# Patient Record
Sex: Female | Born: 1955
Health system: Southern US, Community
[De-identification: ages and names within clinical notes are randomized; demographics above are authoritative.]

## PROBLEM LIST (undated history)

## (undated) ENCOUNTER — Emergency Department (HOSPITAL_COMMUNITY): Admission: EM | Payer: Medicare PPO | Source: Home / Self Care

## (undated) DIAGNOSIS — Z9981 Dependence on supplemental oxygen: Secondary | ICD-10-CM

## (undated) DIAGNOSIS — M069 Rheumatoid arthritis, unspecified: Secondary | ICD-10-CM

## (undated) DIAGNOSIS — R0600 Dyspnea, unspecified: Secondary | ICD-10-CM

## (undated) DIAGNOSIS — F909 Attention-deficit hyperactivity disorder, unspecified type: Secondary | ICD-10-CM

## (undated) DIAGNOSIS — D649 Anemia, unspecified: Secondary | ICD-10-CM

## (undated) DIAGNOSIS — J449 Chronic obstructive pulmonary disease, unspecified: Secondary | ICD-10-CM

## (undated) DIAGNOSIS — R918 Other nonspecific abnormal finding of lung field: Secondary | ICD-10-CM

## (undated) DIAGNOSIS — J189 Pneumonia, unspecified organism: Secondary | ICD-10-CM

## (undated) DIAGNOSIS — C801 Malignant (primary) neoplasm, unspecified: Secondary | ICD-10-CM

## (undated) DIAGNOSIS — Z9289 Personal history of other medical treatment: Secondary | ICD-10-CM

## (undated) HISTORY — PX: BACK SURGERY: SHX140

## (undated) HISTORY — DX: Other nonspecific abnormal finding of lung field: R91.8

## (undated) HISTORY — PX: TUBAL LIGATION: SHX77

## (undated) HISTORY — DX: Rheumatoid arthritis, unspecified: M06.9

## (undated) HISTORY — PX: NASAL SINUS SURGERY: SHX719

---

## 1997-07-31 ENCOUNTER — Ambulatory Visit (HOSPITAL_COMMUNITY): Admission: RE | Admit: 1997-07-31 | Discharge: 1997-07-31 | Payer: Self-pay | Admitting: Family Medicine

## 2010-05-20 ENCOUNTER — Encounter: Payer: Self-pay | Admitting: Family Medicine

## 2013-08-22 DIAGNOSIS — J309 Allergic rhinitis, unspecified: Secondary | ICD-10-CM

## 2013-08-22 HISTORY — DX: Allergic rhinitis, unspecified: J30.9

## 2016-03-24 ENCOUNTER — Other Ambulatory Visit: Payer: Self-pay | Admitting: Rheumatology

## 2016-03-24 NOTE — Telephone Encounter (Signed)
Patient needs a refill of PLQ sent to [facility] on [facility].

## 2016-03-25 MED ORDER — HYDROXYCHLOROQUINE SULFATE 200 MG PO TABS: 300.0000 mg | ORAL_TABLET | Freq: Every day | ORAL | 0 refills | Status: DC

## 2016-03-25 NOTE — Telephone Encounter (Signed)
Last visit and labs WNL 11/27/15 Next visit 04/30/16 Eye exam WNL  04/04/15 Ok to refill per Dr Estanislado Pandy

## 2016-04-25 NOTE — Progress Notes (Signed)
Office Visit Note  Patient: Susan Holder             Date of Birth: 03/19/1956           MRN: [id number]             PCP: No PCP Per Patient Referring: No ref. provider found Visit Date: 04/30/2016 Occupation: '@GUAROCC'$ @    Subjective:  Follow-up (states she is doing well ) Follow-up on rheumatoid arthritis and high risk prescription  History of Present Illness: Susan Holder is a 60 y.o. female  Last seen 11/23/2015 and is requested to Come back on January 2018 which she did. Doing well with her rheumatoid arthritis. No joint pain swelling and stiffness. Taking Plaquenil as prescribed with adequate response. Patient is quite pleased with her Plaquenil and does not have any morning stiffness. Only thing that bothers her right now is that her trigger finger affects her hands at times. There are affecting bilateral third fingers.  She has been having sinusitis for about a week before Christmas. She was treated with Augmentin. She is better but she has not recovered from it fully yet. She is also taking Advil Cold and Sinus.     Activities of Daily Living:  Patient reports morning stiffness for 15 minutes.   Patient Denies nocturnal pain.  Difficulty dressing/grooming: Denies Difficulty climbing stairs: Denies Difficulty getting out of chair: Denies Difficulty using hands for taps, buttons, cutlery, and/or writing: Denies   Review of Systems  Constitutional: Negative for fatigue.  HENT: Negative for mouth sores and mouth dryness.   Eyes: Negative for dryness.  Respiratory: Negative for shortness of breath.   Gastrointestinal: Negative for constipation and diarrhea.  Musculoskeletal: Negative for myalgias and myalgias.  Skin: Negative for sensitivity to sunlight.  Psychiatric/Behavioral: Negative for decreased concentration and sleep disturbance.    PMFS History:  There are no active problems to display for this patient.   No past medical history on file.  No family  history on file. No past surgical history on file. Social History   Social History Narrative  . No narrative on file     Objective: Vital Signs: BP 120/70   Pulse 66   Resp 14   Ht 5' (1.524 m)   Wt 132 lb (59.9 kg)   LMP 04/28/2000   BMI 25.78 kg/m    Physical Exam  Constitutional: She is oriented to person, place, and time. She appears well-developed and well-nourished.  HENT:  Head: Normocephalic and atraumatic.  Eyes: EOM are normal. Pupils are equal, round, and reactive to light.  Cardiovascular: Normal rate, regular rhythm and normal heart sounds.  Exam reveals no gallop and no friction rub.   No murmur heard. Pulmonary/Chest: Effort normal and breath sounds normal. She has no wheezes. She has no rales.  Abdominal: Soft. Bowel sounds are normal. She exhibits no distension. There is no tenderness. There is no guarding. No hernia.  Musculoskeletal: Normal range of motion. She exhibits no edema, tenderness or deformity.  Lymphadenopathy:    She has no cervical adenopathy.  Neurological: She is alert and oriented to person, place, and time. Coordination normal.  Skin: Skin is warm and dry. Capillary refill takes less than 2 seconds. No rash noted.  Psychiatric: She has a normal mood and affect. Her behavior is normal.  Nursing note and vitals reviewed.    Musculoskeletal Exam:  Full range of motion of all joints Grip strength is equal and strong bilaterally Fiber myalgia tender points are  all absent  CDAI Exam: CDAI Homunculus Exam:   Joint Counts:  CDAI Tender Joint count: 0 CDAI Swollen Joint count: 0  Global Assessments:  Patient Global Assessment: 0 Provider Global Assessment: 0    Investigation: Findings:  Labs from 02-08-09 that showed CBC with diff, hepatitis, CK, and TSH all within normal limits.  CCP was elevated at 2,354.  Vitamin D was low at 20.  ACE was normal. Plaquenil eye exam 04/04/15 normal 11/12/2015 normal CBC and CMP 11/23/2015 xrays  performed bilateral hands and feet, no reports available for comment.    Imaging: No results found.  Speciality Comments: No specialty comments available.    Procedures:  No procedures performed Allergies: Patient has no known allergies.   Assessment / Plan:     Visit Diagnoses: Rheumatoid arthritis involving multiple sites with positive rheumatoid factor (HCC) - +RF +ANA +CCP   ANA positive  Vitamin D deficiency - Plan: VITAMIN D 25 Hydroxy (Vit-D Deficiency, Fractures)  High risk medication use - 04/29/2016: ==> plq 200 am & 100 qhs(adeq response). - Plan: CBC with Differential/Platelet, COMPLETE METABOLIC PANEL WITH GFR   High risk medication use   Plan: #1 rheumatoid arthritis. Doing well with Plaquenil 200 mg in the morning and 109. We reminded the patient that it might be wiser to take 200 mg twice a day Monday through Friday. Patient is agreeable. There are Laquidara L tablets that have coding and the pharmacist is recommended not to cut them in half.  #2: Ongoing bilateral third trigger finger. Minor occurrences. Positive patient minimally.  #3: CBC with differential CMP with GFR and vitamin D in office today. Note the patient has a history of vitamin D deficiency in the past. He need to double check and make sure that her vitamin D levels are adequate.  #4: Return to clinic in 5 months for follow-up.  #6: Refill on Plaquenil 200 in the morning and 200 at night 90 day supply with a refill  #6: Smoking cessation advice and patient will continue to make efforts to quit  #7: Patient had Plaquenil eye exam done this past week and all was negative. She will go annually for repeat Plaquenil eye exam.  Orders: Orders Placed This Encounter  Procedures  . CBC with Differential/Platelet  . COMPLETE METABOLIC PANEL WITH GFR  . VITAMIN D 25 Hydroxy (Vit-D Deficiency, Fractures)   Meds ordered this encounter  Medications  . hydroxychloroquine (PLAQUENIL) 200 MG tablet      Sig: Take 1.5 tablets (300 mg total) by mouth daily. One tablet in morning and one tablet  Pm Monday - Friday only.    Dispense:  135 tablet    Refill:  1    Order Specific Question:   Supervising Provider    Answer:   Bo Merino [phone]    Face-to-face time spent with patient was 30 minutes. 50% of time was spent in counseling and coordination of care.  Follow-Up Instructions: Return in about 5 months (around 09/28/2016) for ra.plq 200am, 100qhs, hand pain, ddd c-spine.   Eliezer Lofts, PA-C   Bo Merino, MD

## 2016-04-30 ENCOUNTER — Encounter: Payer: Self-pay | Admitting: Rheumatology

## 2016-04-30 ENCOUNTER — Ambulatory Visit (INDEPENDENT_AMBULATORY_CARE_PROVIDER_SITE_OTHER): Payer: BLUE CROSS/BLUE SHIELD | Admitting: Rheumatology

## 2016-04-30 VITALS — BP 120/70 | HR 66 | Resp 14 | Ht 60.0 in | Wt 132.0 lb

## 2016-04-30 DIAGNOSIS — Z79899 Other long term (current) drug therapy: Secondary | ICD-10-CM | POA: Diagnosis not present

## 2016-04-30 DIAGNOSIS — R768 Other specified abnormal immunological findings in serum: Secondary | ICD-10-CM | POA: Diagnosis not present

## 2016-04-30 DIAGNOSIS — M0579 Rheumatoid arthritis with rheumatoid factor of multiple sites without organ or systems involvement: Secondary | ICD-10-CM

## 2016-04-30 DIAGNOSIS — E559 Vitamin D deficiency, unspecified: Secondary | ICD-10-CM

## 2016-04-30 DIAGNOSIS — R5383 Other fatigue: Secondary | ICD-10-CM

## 2016-04-30 DIAGNOSIS — M65341 Trigger finger, right ring finger: Secondary | ICD-10-CM

## 2016-04-30 DIAGNOSIS — M5030 Other cervical disc degeneration, unspecified cervical region: Secondary | ICD-10-CM

## 2016-04-30 DIAGNOSIS — F17200 Nicotine dependence, unspecified, uncomplicated: Secondary | ICD-10-CM

## 2016-04-30 LAB — CBC WITH DIFFERENTIAL/PLATELET
Basophils Absolute: 0 cells/uL (ref 0–200)
Basophils Relative: 0 %
Eosinophils Absolute: 128 cells/uL (ref 15–500)
Eosinophils Relative: 1 %
HCT: 39.6 % (ref 35.0–45.0)
Hemoglobin: 12.8 g/dL (ref 11.7–15.5)
Lymphocytes Relative: 34 %
Lymphs Abs: 4352 cells/uL — ABNORMAL HIGH (ref 850–3900)
MCH: 30.5 pg (ref 27.0–33.0)
MCHC: 32.3 g/dL (ref 32.0–36.0)
MCV: 94.5 fL (ref 80.0–100.0)
MPV: 10 fL (ref 7.5–12.5)
Monocytes Absolute: 768 cells/uL (ref 200–950)
Monocytes Relative: 6 %
Neutro Abs: 7552 cells/uL (ref 1500–7800)
Neutrophils Relative %: 59 %
Platelets: 356 10*3/uL (ref 140–400)
RBC: 4.19 MIL/uL (ref 3.80–5.10)
RDW: 14.5 % (ref 11.0–15.0)
WBC: 12.8 10*3/uL — ABNORMAL HIGH (ref 3.8–10.8)

## 2016-04-30 MED ORDER — HYDROXYCHLOROQUINE SULFATE 200 MG PO TABS: 300.0000 mg | ORAL_TABLET | Freq: Every day | ORAL | 1 refills | Status: DC

## 2016-04-30 NOTE — Patient Instructions (Signed)
Hand Exercises Introduction Hand exercises can be helpful to almost anyone. These exercises can strengthen the hands, improve flexibility and movement, and increase blood flow to the hands. These results can make work and daily tasks easier. Hand exercises can be especially helpful for people who have joint pain from arthritis or have nerve damage from overuse (carpal tunnel syndrome). These exercises can also help people who have injured a hand. Most of these hand exercises are fairly gentle stretching routines. You can do them often throughout the day. Still, it is a good idea to ask your health care provider which exercises would be best for you. Warming your hands before exercise may help to reduce stiffness. You can do this with gentle massage or by placing your hands in warm water for 15 minutes. Also, make sure you pay attention to your level of hand pain as you begin an exercise routine. Exercises Knuckle Bend  Repeat this exercise 5-10 times with each hand. 1. Stand or sit with your arm, hand, and all five fingers pointed straight up. Make sure your wrist is straight. 2. Gently and slowly bend your fingers down and inward until the tips of your fingers are touching the tops of your palm. 3. Hold this position for a few seconds. 4. Extend your fingers out to their original position, all pointing straight up again. Finger Fan  Repeat this exercise 5-10 times with each hand. 1. Hold your arm and hand out in front of you. Keep your wrist straight. 2. Squeeze your hand into a fist. 3. Hold this position for a few seconds. 4. Edison Simon out, or spread apart, your hand and fingers as much as possible, stretching every joint fully. Tabletop  Repeat this exercise 5-10 times with each hand. 1. Stand or sit with your arm, hand, and all five fingers pointed straight up. Make sure your wrist is straight. 2. Gently and slowly bend your fingers at the knuckles where they meet the hand until your hand is  making an upside-down L shape. Your fingers should form a tabletop. 3. Hold this position for a few seconds. 4. Extend your fingers out to their original position, all pointing straight up again. Making Os  Repeat this exercise 5-10 times with each hand. 1. Stand or sit with your arm, hand, and all five fingers pointed straight up. Make sure your wrist is straight. 2. Make an O shape by touching your pointer finger to your thumb. Hold for a few seconds. Then open your hand wide. 3. Repeat this motion with each finger on your hand. Table Spread  Repeat this exercise 5-10 times with each hand. 1. Place your hand on a table with your palm facing down. Make sure your wrist is straight. 2. Spread your fingers out as much as possible. Hold this position for a few seconds. 3. Slide your fingers back together again. Hold for a few seconds. Ball Grip  Repeat this exercise 10-15 times with each hand. 1. Hold a tennis ball or another soft ball in your hand. 2. While slowly increasing pressure, squeeze the ball as hard as possible. 3. Squeeze as hard as you can for 3-5 seconds. 4. Relax and repeat. Wrist Curls  Repeat this exercise 10-15 times with each hand. 1. Sit in a chair that has armrests. 2. Hold a light weight in your hand, such as a dumbbell that weighs 1-3 pounds (0.5-1.4 kg). Ask your health care provider what weight would be best for you. 3. Rest your hand just  over the end of the chair arm with your palm facing up. 4. Gently pivot your wrist up and down while holding the weight. Do not twist your wrist from side to side. Contact a health care provider if:  Your hand pain or discomfort gets much worse when you do an exercise.  Your hand pain or discomfort does not improve within 2 hours after you exercise. If you have any of these problems, stop doing these exercises right away. Do not do them again unless your health care provider says that you can. Get help right away if:  You  develop sudden, severe hand pain. If this happens, stop doing these exercises right away. Do not do them again unless your health care provider says that you can. This information is not intended to replace advice given to you by your health care provider. Make sure you discuss any questions you have with your health care provider. Document Released: 03/26/2015 Document Revised: 09/20/2015 Document Reviewed: 10/23/2014  2017 Elsevier

## 2016-05-01 ENCOUNTER — Other Ambulatory Visit: Payer: Self-pay | Admitting: *Deleted

## 2016-05-01 LAB — COMPLETE METABOLIC PANEL WITH GFR
ALT: 33 U/L — ABNORMAL HIGH (ref 6–29)
AST: 35 U/L (ref 10–35)
Albumin: 4.2 g/dL (ref 3.6–5.1)
Alkaline Phosphatase: 91 U/L (ref 33–130)
BUN: 9 mg/dL (ref 7–25)
CO2: 24 mmol/L (ref 20–31)
Calcium: 9.6 mg/dL (ref 8.6–10.4)
Chloride: 106 mmol/L (ref 98–110)
Creat: 0.78 mg/dL (ref 0.50–0.99)
GFR, Est African American: >89 mL/min (ref >=60)
GFR, Est Non African American: 82 mL/min (ref >=60)
Glucose, Bld: 87 mg/dL (ref 65–99)
Potassium: 4.7 mmol/L (ref 3.5–5.3)
Sodium: 141 mmol/L (ref 135–146)
Total Bilirubin: 0.3 mg/dL (ref 0.2–1.2)
Total Protein: 6.8 g/dL (ref 6.1–8.1)

## 2016-05-01 LAB — VITAMIN D 25 HYDROXY (VIT D DEFICIENCY, FRACTURES): Vit D, 25-Hydroxy: 14 ng/mL — ABNORMAL LOW (ref 30–100)

## 2016-05-02 ENCOUNTER — Telehealth: Payer: Self-pay | Admitting: *Deleted

## 2016-05-02 MED ORDER — VITAMIN D (ERGOCALCIFEROL) 1.25 MG (50000 UNIT) PO CAPS: 50000.0000 [IU] | ORAL_CAPSULE | ORAL | 0 refills | Status: DC

## 2016-05-02 NOTE — Telephone Encounter (Signed)
-----   Message from Eliezer Lofts, [place] sent at 05/01/2016  1:32 PM EST ----- #1: Vitamin D is low at 14. Treatment: Vitamin D 3,; 50,000 international units; 1 pill 2 times per week(Wednesday and Saturday) dispense 24 pills with no refills Labs: Repeat vitamin D 59 [place] in 3 months  #2: CBC with differential, CMP with GFR within normal limits except white blood cell count is slightly elevated at 12.8. She had sinus infection at the time of visit. If she gets any worse she can follow with PCP.  #3: Please send these labs to her PCP and at the PCPs name to patient's chart.

## 2016-05-12 ENCOUNTER — Telehealth: Payer: Self-pay | Admitting: Rheumatology

## 2016-05-12 NOTE — Telephone Encounter (Signed)
Patient returning call regarding lab results  ?

## 2016-05-12 NOTE — Telephone Encounter (Signed)
Patient has has already been advised of lab results and has picked up her prescription.

## 2016-05-29 ENCOUNTER — Other Ambulatory Visit: Payer: Self-pay | Admitting: Rheumatology

## 2016-05-29 DIAGNOSIS — R768 Other specified abnormal immunological findings in serum: Secondary | ICD-10-CM | POA: Insufficient documentation

## 2016-05-29 DIAGNOSIS — M65341 Trigger finger, right ring finger: Secondary | ICD-10-CM | POA: Insufficient documentation

## 2016-05-29 DIAGNOSIS — R5383 Other fatigue: Secondary | ICD-10-CM | POA: Insufficient documentation

## 2016-05-29 DIAGNOSIS — M5030 Other cervical disc degeneration, unspecified cervical region: Secondary | ICD-10-CM | POA: Insufficient documentation

## 2016-05-29 DIAGNOSIS — F17200 Nicotine dependence, unspecified, uncomplicated: Secondary | ICD-10-CM | POA: Insufficient documentation

## 2016-05-29 DIAGNOSIS — M65342 Trigger finger, left ring finger: Secondary | ICD-10-CM | POA: Insufficient documentation

## 2016-05-29 DIAGNOSIS — Z79899 Other long term (current) drug therapy: Secondary | ICD-10-CM | POA: Insufficient documentation

## 2016-05-29 DIAGNOSIS — M0579 Rheumatoid arthritis with rheumatoid factor of multiple sites without organ or systems involvement: Secondary | ICD-10-CM

## 2016-05-29 DIAGNOSIS — E559 Vitamin D deficiency, unspecified: Secondary | ICD-10-CM | POA: Insufficient documentation

## 2016-05-29 HISTORY — DX: Rheumatoid arthritis with rheumatoid factor of multiple sites without organ or systems involvement: M05.79

## 2016-05-29 HISTORY — DX: Nicotine dependence, unspecified, uncomplicated: F17.200

## 2016-05-29 HISTORY — DX: Other fatigue: R53.83

## 2016-05-29 HISTORY — DX: Other long term (current) drug therapy: Z79.899

## 2016-05-29 HISTORY — DX: Vitamin D deficiency, unspecified: E55.9

## 2016-05-29 HISTORY — DX: Trigger finger, right ring finger: M65.341

## 2016-05-29 HISTORY — DX: Other cervical disc degeneration, unspecified cervical region: M50.30

## 2016-05-29 HISTORY — DX: Trigger finger, left ring finger: M65.342

## 2016-05-29 HISTORY — DX: Other specified abnormal immunological findings in serum: R76.8

## 2016-05-29 NOTE — Telephone Encounter (Signed)
Last Visit: 04/30/16 Next Visit: 10/06/16 Labs: 04/30/16 Low Vit D, WBC 12.8 PLQ Eye Exam: 04/29/16 WNL  Okay to refill PLQ?

## 2016-05-29 NOTE — Telephone Encounter (Signed)
Susan Holder,I updated her problem list.I have approved her Plaquenil refill.

## 2016-07-21 ENCOUNTER — Other Ambulatory Visit: Payer: Self-pay | Admitting: Rheumatology

## 2016-07-22 ENCOUNTER — Telehealth: Payer: Self-pay | Admitting: Rheumatology

## 2016-07-22 NOTE — Telephone Encounter (Signed)
Last Visit: 04/30/16 Next visit: 09/26/16 Labs: 04/30/16 WBC 12.8 PLQ Eye Exam: 04/29/16 WNL  Okay to refill PLQ?

## 2016-07-22 NOTE — Telephone Encounter (Signed)
Left message to advise patient lbs needed before medication can be refilled

## 2016-07-22 NOTE — Telephone Encounter (Signed)
Patient calling in ref to Vitamin D. She also needs a refill for PLQ. Please call in @ [place]

## 2016-07-23 MED ORDER — HYDROXYCHLOROQUINE SULFATE 200 MG PO TABS: ORAL_TABLET | ORAL | 0 refills | Status: DC

## 2016-07-23 NOTE — Telephone Encounter (Signed)
I signed the plq order. Please recheck to be sure it was signed and pt will be receiving there plq.

## 2016-09-26 ENCOUNTER — Ambulatory Visit: Payer: BLUE CROSS/BLUE SHIELD | Admitting: Rheumatology

## 2016-10-06 ENCOUNTER — Ambulatory Visit: Payer: BLUE CROSS/BLUE SHIELD | Admitting: Rheumatology

## 2016-11-12 NOTE — Progress Notes (Signed)
Office Visit Note  Patient: Susan Holder             Date of Birth: Sep 23, 1955           MRN: [id number]             PCP: Patient, No Pcp Per Referring: No ref. provider found Visit Date: 11/14/2016 Occupation: @GUAROCC @    Subjective:  Left hand pain.   History of Present Illness: Susan Holder is a 61 y.o. female with history of sero positive rheumatoid arthritis. She states she's been having some discomfort in her left thumb. She has some stiffness off-and-on in her bilateral hands but no significant joint swelling. She has been having some issues with her left third trigger finger. Her neck is doing better.  Activities of Daily Living:  Patient reports morning stiffness for 0 minutes.   Patient Denies nocturnal pain.  Difficulty dressing/grooming: Denies Difficulty climbing stairs: Denies Difficulty getting out of chair: Denies Difficulty using hands for taps, buttons, cutlery, and/or writing: Denies   Review of Systems  Constitutional: Negative for fatigue, night sweats, weight gain, weight loss and weakness.  HENT: Negative for mouth sores, trouble swallowing, trouble swallowing, mouth dryness and nose dryness.   Eyes: Negative for pain, redness, visual disturbance and dryness.  Respiratory: Negative for cough, shortness of breath and difficulty breathing.   Cardiovascular: Negative for chest pain, palpitations, hypertension, irregular heartbeat and swelling in legs/feet.  Gastrointestinal: Negative for blood in stool, constipation and diarrhea.  Endocrine: Negative for increased urination.  Genitourinary: Negative for vaginal dryness.  Musculoskeletal: Positive for arthralgias, joint pain and morning stiffness. Negative for joint swelling, myalgias, muscle weakness, muscle tenderness and myalgias.  Skin: Negative for color change, rash, hair loss, skin tightness, ulcers and sensitivity to sunlight.  Allergic/Immunologic: Negative for susceptible to infections.    Neurological: Negative for dizziness, memory loss and night sweats.  Hematological: Negative for swollen glands.  Psychiatric/Behavioral: Negative for depressed mood and sleep disturbance. The patient is not nervous/anxious.     PMFS History:  Patient Active Problem List   Diagnosis Date Noted  . Rheumatoid arthritis involving multiple sites with positive rheumatoid factor ([place]) 05/29/2016  . ANA positive 05/29/2016  . Vitamin D deficiency 05/29/2016  . High risk medication use 05/29/2016  . Trigger finger, left ring finger 05/29/2016  . Trigger finger, right ring finger 05/29/2016  . DDD (degenerative disc disease), cervical 05/29/2016  . Smoker 05/29/2016  . Other fatigue 05/29/2016    Past Medical History:  Diagnosis Date  . Rheumatoid arthritis ([place])     History reviewed. No pertinent family history. Past Surgical History:  Procedure Laterality Date  . TUBAL LIGATION     Social History   Social History Narrative  . No narrative on file     Objective: Vital Signs: BP 139/70 (BP Location: Left Arm, Patient Position: Sitting, Cuff Size: Normal)   Pulse 76   Resp 14   Ht 5' (1.524 m)   Wt 123 lb (55.8 kg)   LMP 04/28/2000   BMI 24.02 kg/m    Physical Exam  Constitutional: She is oriented to person, place, and time. She appears well-developed and well-nourished.  HENT:  Head: Normocephalic and atraumatic.  Eyes: Conjunctivae and EOM are normal.  Neck: Normal range of motion.  Cardiovascular: Normal rate, regular rhythm, normal heart sounds and intact distal pulses.   Pulmonary/Chest: Effort normal and breath sounds normal.  Abdominal: Soft. Bowel sounds are normal.  Lymphadenopathy:  She has no cervical adenopathy.  Neurological: She is alert and oriented to person, place, and time.  Skin: Skin is warm and dry. Capillary refill takes less than 2 seconds.  Psychiatric: She has a normal mood and affect. Her behavior is normal.  Nursing note and vitals  reviewed.    Musculoskeletal Exam: C-spine and thoracic spine lumbar spine good range of motion. Shoulder joints elbow joints are good range of motion. She had some synovitis on palpation of her left first MCP joint. She also has left third trigger finger. The and of the other joints showed any synovitis. Hip joints knee joints ankles MTPs PIPs DIPs are good range of motion with no synovitis.  CDAI Exam: CDAI Homunculus Exam:   Tenderness:  Left hand: 1st MCP  Swelling:  Left hand: 1st MCP  Joint Counts:  CDAI Tender Joint count: 1 CDAI Swollen Joint count: 1  Global Assessments:  Patient Global Assessment: 1 Provider Global Assessment: 4  CDAI Calculated Score: 7    Investigation: No additional findings.  04/30/2016 WBC 12.8, CMP ALT33, Vit D 14 Imaging: Xr Hand 2 View Left  Result Date: 11/14/2016 Left first MCP subluxation noted. Mild second MCP joint narrowing was noted. Juxta articular osteopenia was noted. No intercarpal or radiocarpal joint space narrowing was noted. No erosive changes were noted. Impression: These findings are consistent with rheumatoid arthritis  Xr Hand 2 View Right  Result Date: 11/14/2016 Mild narrowing of the right second MCP narrowing was noted. All PIP/DIP narrowing was noted. Juxta articular osteopenia was noted. No intercarpal or radiocarpal joint space narrowing was noted. No erosive changes were noted. Impression: These findings are consistent with rheumatoid arthritis   Speciality Comments: No specialty comments available.    Procedures:  No procedures performed Allergies: Patient has no known allergies.   Assessment / Plan:     Visit Diagnoses: Rheumatoid arthritis involving multiple sites with positive rheumatoid factor (HCC) - +RF, +anti-CCP, +ANA. She has some synovitis in her left first MCP joint. None of the other joints are swollen. She continues to have some stiffness in her hands which she relates to working several hours  a day.  Pain in both hands. She has synovitis in her left first MCP joint. I plan ultrasound-guided left first MCP injection. If she has inadequate response to that we may consider more aggressive therapy in future. - Plan: XR Hand 2 View Right, XR Hand 2 View Left. The x-rays revealed subluxation of the left first MCP joint. Juxta articular osteopenia was noted.  High risk medication use - PLQ 200mg  po twice a day Monday to Friday - Plan: CBC with Differential/Platelet, COMPLETE METABOLIC PANEL WITH GFR today and every 5 months. In case we have to try more aggressive therapy if the chart review today she had normal SPEP, immunoglobulins, hepatitis panel, TB gold in March 2015.  Trigger middle finger of left hand: It is causing intermittent discomfort. We will schedule ultrasound-guided injection for that as well.  DDD (degenerative disc disease), cervical: Not having much discomfort  Smoker: Smoking cessation was discussed.  Vitamin D deficiency. Her vitamin D was only 14 in the past. She took a course of vitamin D for 3 months but did not have repeat labs. We will check labs today. - Plan: VITAMIN D 25 Hydroxy (Vit-D Deficiency, Fractures)    Orders: Orders Placed This Encounter  Procedures  . XR Hand 2 View Right  . XR Hand 2 View Left  . CBC with Differential/Platelet  .  COMPLETE METABOLIC PANEL WITH GFR  . VITAMIN D 25 Hydroxy (Vit-D Deficiency, Fractures)   No orders of the defined types were placed in this encounter.   Face-to-face time spent with patient was 25 minutes. 50% of time was spent in counseling and coordination of care.  Follow-Up Instructions: Return in about 3 months (around 02/14/2017) for Rheumatoid arthritis.   Bo Merino, MD  Note - This record has been created using Editor, commissioning.  Chart creation errors have been sought, but may not always  have been located. Such creation errors do not reflect on  the standard of medical care.

## 2016-11-14 ENCOUNTER — Encounter: Payer: Self-pay | Admitting: Rheumatology

## 2016-11-14 ENCOUNTER — Ambulatory Visit (INDEPENDENT_AMBULATORY_CARE_PROVIDER_SITE_OTHER): Payer: BLUE CROSS/BLUE SHIELD | Admitting: Rheumatology

## 2016-11-14 ENCOUNTER — Ambulatory Visit (INDEPENDENT_AMBULATORY_CARE_PROVIDER_SITE_OTHER): Payer: Self-pay

## 2016-11-14 VITALS — BP 139/70 | HR 76 | Resp 14 | Ht 60.0 in | Wt 123.0 lb

## 2016-11-14 DIAGNOSIS — M0579 Rheumatoid arthritis with rheumatoid factor of multiple sites without organ or systems involvement: Secondary | ICD-10-CM

## 2016-11-14 DIAGNOSIS — M79642 Pain in left hand: Secondary | ICD-10-CM | POA: Diagnosis not present

## 2016-11-14 DIAGNOSIS — M5030 Other cervical disc degeneration, unspecified cervical region: Secondary | ICD-10-CM

## 2016-11-14 DIAGNOSIS — M79641 Pain in right hand: Secondary | ICD-10-CM

## 2016-11-14 DIAGNOSIS — E559 Vitamin D deficiency, unspecified: Secondary | ICD-10-CM

## 2016-11-14 DIAGNOSIS — Z79899 Other long term (current) drug therapy: Secondary | ICD-10-CM

## 2016-11-14 DIAGNOSIS — M65332 Trigger finger, left middle finger: Secondary | ICD-10-CM

## 2016-11-14 DIAGNOSIS — F17200 Nicotine dependence, unspecified, uncomplicated: Secondary | ICD-10-CM | POA: Diagnosis not present

## 2016-11-14 LAB — COMPLETE METABOLIC PANEL WITH GFR
ALT: 8 U/L (ref 6–29)
AST: 19 U/L (ref 10–35)
Albumin: 4 g/dL (ref 3.6–5.1)
Alkaline Phosphatase: 76 U/L (ref 33–130)
BUN: 10 mg/dL (ref 7–25)
CO2: 25 mmol/L (ref 20–31)
Calcium: 9.3 mg/dL (ref 8.6–10.4)
Chloride: 104 mmol/L (ref 98–110)
Creat: 0.88 mg/dL (ref 0.50–0.99)
GFR, Est African American: 81 mL/min (ref >=60)
GFR, Est Non African American: 70 mL/min (ref >=60)
Glucose, Bld: 82 mg/dL (ref 65–99)
Potassium: 4.2 mmol/L (ref 3.5–5.3)
Sodium: 138 mmol/L (ref 135–146)
Total Bilirubin: 0.3 mg/dL (ref 0.2–1.2)
Total Protein: 6.4 g/dL (ref 6.1–8.1)

## 2016-11-14 LAB — CBC WITH DIFFERENTIAL/PLATELET
Basophils Absolute: 0 cells/uL (ref 0–200)
Basophils Relative: 0 %
Eosinophils Absolute: 89 cells/uL (ref 15–500)
Eosinophils Relative: 1 %
HCT: 38.1 % (ref 35.0–45.0)
Hemoglobin: 12.5 g/dL (ref 11.7–15.5)
Lymphocytes Relative: 42 %
Lymphs Abs: 3738 cells/uL (ref 850–3900)
MCH: 30.3 pg (ref 27.0–33.0)
MCHC: 32.8 g/dL (ref 32.0–36.0)
MCV: 92.5 fL (ref 80.0–100.0)
MPV: 10.4 fL (ref 7.5–12.5)
Monocytes Absolute: 712 cells/uL (ref 200–950)
Monocytes Relative: 8 %
Neutro Abs: 4361 cells/uL (ref 1500–7800)
Neutrophils Relative %: 49 %
Platelets: 287 10*3/uL (ref 140–400)
RBC: 4.12 MIL/uL (ref 3.80–5.10)
RDW: 14.4 % (ref 11.0–15.0)
WBC: 8.9 10*3/uL (ref 3.8–10.8)

## 2016-11-15 LAB — VITAMIN D 25 HYDROXY (VIT D DEFICIENCY, FRACTURES): Vit D, 25-Hydroxy: 18 ng/mL — ABNORMAL LOW (ref 30–100)

## 2016-11-15 NOTE — Progress Notes (Signed)
Vit D 50,000 U twice a week #90d . Repeat labs in 3 months.

## 2016-11-17 ENCOUNTER — Telehealth: Payer: Self-pay | Admitting: *Deleted

## 2016-11-17 DIAGNOSIS — E559 Vitamin D deficiency, unspecified: Secondary | ICD-10-CM

## 2016-11-17 MED ORDER — VITAMIN D (ERGOCALCIFEROL) 1.25 MG (50000 UNIT) PO CAPS: 50000.0000 [IU] | ORAL_CAPSULE | ORAL | 0 refills | Status: DC

## 2016-11-17 NOTE — Telephone Encounter (Signed)
-----   Message from Bo Merino, MD sent at 11/15/2016  8:09 AM EDT ----- Vit D 50,000 U twice a week #90d . Repeat labs in 3 months.

## 2016-12-11 ENCOUNTER — Telehealth: Payer: Self-pay | Admitting: Rheumatology

## 2016-12-11 NOTE — Telephone Encounter (Signed)
Patient needs a refill on her Plaquenil. [facility] uses [facility] on [address], [address] [place].

## 2016-12-12 MED ORDER — HYDROXYCHLOROQUINE SULFATE 200 MG PO TABS: ORAL_TABLET | ORAL | 0 refills | Status: DC

## 2016-12-12 NOTE — Telephone Encounter (Signed)
11/14/16 last visit 02/04/17 next visit   Eye exam normal 04/29/16   CBC Latest Ref Rng & Units 11/14/2016 04/30/2016  WBC 3.8 - 10.8 K/uL 8.9 12.8(H)  Hemoglobin 11.7 - 15.5 g/dL 12.5 12.8  Hematocrit 35.0 - 45.0 % 38.1 39.6  Platelets 140 - 400 K/uL 287 356   CMP Latest Ref Rng & Units 11/14/2016 04/30/2016  Glucose 65 - 99 mg/dL 82 87  BUN 7 - 25 mg/dL 10 9  Creatinine 0.50 - 0.99 mg/dL 0.88 0.78  Sodium 135 - 146 mmol/L 138 141  Potassium 3.5 - 5.3 mmol/L 4.2 4.7  Chloride 98 - 110 mmol/L 104 106  CO2 20 - 31 mmol/L 25 24  Calcium 8.6 - 10.4 mg/dL 9.3 9.6  Total Protein 6.1 - 8.1 g/dL 6.4 6.8  Total Bilirubin 0.2 - 1.2 mg/dL 0.3 0.3  Alkaline Phos 33 - 130 U/L 76 91  AST 10 - 35 U/L 19 35  ALT 6 - 29 U/L 8 33(H)   Ok to refill per Dr Estanislado Pandy

## 2017-01-27 NOTE — Progress Notes (Deleted)
Assessment / Plan:     Visit Diagnoses: Rheumatoid arthritis involving multiple sites with positive rheumatoid factor (HCC) - +RF, +anti-CCP, +ANA. She has some synovitis in her left first MCP joint. None of the other joints are swollen. She continues to have some stiffness in her hands which she relates to working several hours a day.  Pain in both hands. She has synovitis in her left first MCP joint. I plan ultrasound-guided left first MCP injection. If she has inadequate response to that we may consider more aggressive therapy in future. - Plan: XR Hand 2 View Right, XR Hand 2 View Left. The x-rays revealed subluxation of the left first MCP joint. Juxta articular osteopenia was noted.

## 2017-02-04 ENCOUNTER — Other Ambulatory Visit: Payer: Self-pay | Admitting: Rheumatology

## 2017-02-04 NOTE — Telephone Encounter (Signed)
Left message to advise patient will need labs drawn before refill.

## 2017-02-05 ENCOUNTER — Other Ambulatory Visit: Payer: Self-pay | Admitting: Rheumatology

## 2017-02-05 ENCOUNTER — Telehealth: Payer: Self-pay

## 2017-02-05 NOTE — Telephone Encounter (Signed)
Patient left VM stating that she missed her appointment that was scheduled for 02/05/17 for [place].  Patient would like to reschedule.  CB# is [phone].  Please advise.  Thank You.

## 2017-02-05 NOTE — Telephone Encounter (Signed)
Patient cancelled her ultrasound appointment for 02/04/17 and then no showed the appointment for today. What do you want to do about this?

## 2017-02-06 NOTE — Telephone Encounter (Signed)
She was seen in July. Ok to reschedule.

## 2017-02-08 NOTE — Progress Notes (Signed)
Office Visit Note  Patient: Susan Holder             Date of Birth: August 04, 1955           MRN: [id number]             PCP: Patient, No Pcp Per Referring: No ref. provider found Visit Date: 02/20/2017 Occupation: @GUAROCC @    Subjective:  Hand pain.   History of Present Illness: Susan Holder is a 61 y.o. female with history of sero positive rheumatoid arthritis osteoarthritis and disc disease. She states she has not had any flares of rheumatoid arthritis. She's been tolerating Plaquenil well. She's not having much stiffness. She continues to have some discomfort in her left thumb first MCP. She also has some discomfort with left third trigger finger off-and-on.  Activities of Daily Living:  Patient reports morning stiffness for 0 minute.   Patient Denies nocturnal pain.  Difficulty dressing/grooming: Denies Difficulty climbing stairs: Denies Difficulty getting out of chair: Denies Difficulty using hands for taps, buttons, cutlery, and/or writing: Reports   Review of Systems  Constitutional: Negative.  Negative for fatigue and weakness.  HENT: Negative.  Negative for mouth dryness.   Eyes: Negative.  Negative for dryness.  Respiratory: Positive for cough. Negative for shortness of breath.        Recent infection  Cardiovascular: Negative.  Negative for chest pain, palpitations, hypertension and swelling in legs/feet.  Gastrointestinal: Negative.  Negative for blood in stool, constipation and diarrhea.  Musculoskeletal: Negative.  Negative for arthralgias, joint pain, joint swelling, myalgias, muscle weakness, morning stiffness, muscle tenderness and myalgias.  Skin: Positive for hair loss. Negative for rash and sensitivity to sunlight.  Neurological: Negative.  Negative for dizziness, numbness and headaches.  Psychiatric/Behavioral: Negative.  Negative for depressed mood and sleep disturbance.    PMFS History:  Patient Active Problem List   Diagnosis Date Noted  .  Rheumatoid arthritis involving multiple sites with positive rheumatoid factor ([place]) 05/29/2016  . ANA positive 05/29/2016  . Vitamin D deficiency 05/29/2016  . High risk medication use 05/29/2016  . Trigger finger, left ring finger 05/29/2016  . Trigger finger, right ring finger 05/29/2016  . DDD (degenerative disc disease), cervical 05/29/2016  . Smoker 05/29/2016  . Other fatigue 05/29/2016    Past Medical History:  Diagnosis Date  . Rheumatoid arthritis ([place])     No family history on file. Past Surgical History:  Procedure Laterality Date  . TUBAL LIGATION     Social History   Social History Narrative  . No narrative on file     Objective: Vital Signs: BP 126/67 (BP Location: Left Arm, Patient Position: Sitting, Cuff Size: Normal)   Pulse 75   Ht 5' (1.524 m)   Wt 128 lb (58.1 kg)   LMP 04/28/2000   BMI 25.00 kg/m    Physical Exam  Constitutional: She is oriented to person, place, and time. She appears well-developed and well-nourished.  HENT:  Head: Normocephalic and atraumatic.  Eyes: Conjunctivae and EOM are normal.  Neck: Normal range of motion.  Cardiovascular: Normal rate, regular rhythm, normal heart sounds and intact distal pulses.   Pulmonary/Chest: Effort normal and breath sounds normal.  Abdominal: Soft. Bowel sounds are normal.  Lymphadenopathy:    She has no cervical adenopathy.  Neurological: She is alert and oriented to person, place, and time.  Skin: Skin is warm and dry. Capillary refill takes less than 2 seconds.  Psychiatric: She has a normal mood  and affect. Her behavior is normal.  Nursing note and vitals reviewed.    Musculoskeletal Exam: C-spine and thoracic lumbar spine good range of motion. Shoulder joints elbow joints wrist joints are good range of motion. She has left first MCP subluxation which is causing discomfort. Although joints did not show any synovitis. Hip joints knee joints ankles MTPs PIPs with good range of motion with no  synovitis.  CDAI Exam: CDAI Homunculus Exam:   Joint Counts:  CDAI Tender Joint count: 0 CDAI Swollen Joint count: 0  Global Assessments:  Patient Global Assessment: 1 Provider Global Assessment: 1  CDAI Calculated Score: 2    Investigation: No additional findings.PLQ eye exam: 04/2016 CBC Latest Ref Rng & Units 11/14/2016 04/30/2016  WBC 3.8 - 10.8 K/uL 8.9 12.8(H)  Hemoglobin 11.7 - 15.5 g/dL 12.5 12.8  Hematocrit 35.0 - 45.0 % 38.1 39.6  Platelets 140 - 400 K/uL 287 356   CMP Latest Ref Rng & Units 11/14/2016 04/30/2016  Glucose 65 - 99 mg/dL 82 87  BUN 7 - 25 mg/dL 10 9  Creatinine 0.50 - 0.99 mg/dL 0.88 0.78  Sodium 135 - 146 mmol/L 138 141  Potassium 3.5 - 5.3 mmol/L 4.2 4.7  Chloride 98 - 110 mmol/L 104 106  CO2 20 - 31 mmol/L 25 24  Calcium 8.6 - 10.4 mg/dL 9.3 9.6  Total Protein 6.1 - 8.1 g/dL 6.4 6.8  Total Bilirubin 0.2 - 1.2 mg/dL 0.3 0.3  Alkaline Phos 33 - 130 U/L 76 91  AST 10 - 35 U/L 19 35  ALT 6 - 29 U/L 8 33(H)    Imaging: No results found.  Speciality Comments: No specialty comments available.    Procedures:  No procedures performed Allergies: Patient has no known allergies.   Assessment / Plan:     Visit Diagnoses: Rheumatoid arthritis involving multiple sites with positive rheumatoid factor (HCC) - +RF, +anti-CCP, +ANA.She is clinically doing. She denies any significant stiffness or discomfort in her joints. She continues to have discomfort in her left first MCP joint which is been subluxed.. Detailed discussion regarding possible surgery. She would like to have a hand surgery referral.  High risk medication use - PLQ 200mg  po twice a day Monday to Friday. eye exam: 04/2016 - Plan: CBC with Differential/Platelet, COMPLETE METABOLIC PANEL WITH GFR today and then every 5 months. Her eye exam will be due in January. We will give her a Plaquenil eye exam form.  Pain of left thumb: Will refer her to hand surgery. She has seen Dr. Fredna Dow in the  past.  Trigger middle finger of left hand: Currently not very symptomatic.  DDD (degenerative disc disease), cervical: Off-and-on discomfort  Vitamin D deficiency - Plan: VITAMIN D 25 Hydroxy (Vit-D Deficiency, Fractures)  Smoker : Cessation discussed.   Orders: Orders Placed This Encounter  Procedures  . CBC with Differential/Platelet  . COMPLETE METABOLIC PANEL WITH GFR  . VITAMIN D 25 Hydroxy (Vit-D Deficiency, Fractures)  . Ambulatory referral to Hand Surgery   No orders of the defined types were placed in this encounter.   Follow-Up Instructions: Return in about 5 months (around 07/21/2017) for Rheumatoid arthritis.   Bo Merino, MD  Note - This record has been created using Editor, commissioning.  Chart creation errors have been sought, but may not always  have been located. Such creation errors do not reflect on  the standard of medical care.

## 2017-02-20 ENCOUNTER — Ambulatory Visit (INDEPENDENT_AMBULATORY_CARE_PROVIDER_SITE_OTHER): Payer: BLUE CROSS/BLUE SHIELD | Admitting: Rheumatology

## 2017-02-20 ENCOUNTER — Encounter: Payer: Self-pay | Admitting: Rheumatology

## 2017-02-20 VITALS — BP 126/67 | HR 75 | Ht 60.0 in | Wt 128.0 lb

## 2017-02-20 DIAGNOSIS — M65332 Trigger finger, left middle finger: Secondary | ICD-10-CM | POA: Diagnosis not present

## 2017-02-20 DIAGNOSIS — M79645 Pain in left finger(s): Secondary | ICD-10-CM

## 2017-02-20 DIAGNOSIS — E559 Vitamin D deficiency, unspecified: Secondary | ICD-10-CM

## 2017-02-20 DIAGNOSIS — M0579 Rheumatoid arthritis with rheumatoid factor of multiple sites without organ or systems involvement: Secondary | ICD-10-CM | POA: Diagnosis not present

## 2017-02-20 DIAGNOSIS — F17200 Nicotine dependence, unspecified, uncomplicated: Secondary | ICD-10-CM | POA: Diagnosis not present

## 2017-02-20 DIAGNOSIS — Z79899 Other long term (current) drug therapy: Secondary | ICD-10-CM | POA: Diagnosis not present

## 2017-02-20 DIAGNOSIS — M5030 Other cervical disc degeneration, unspecified cervical region: Secondary | ICD-10-CM

## 2017-02-20 NOTE — Patient Instructions (Signed)
Standing Labs We placed an order today for your standing lab work.    Please come back and get your standing labs in March  We have open lab Monday through Friday from 8:30-11:30 AM and 1:30-4 PM at the office of Dr. Fusako Tanabe.   The office is located at [address], [address], [place], [place] [address] No appointment is necessary.   Labs are drawn by [facility].  You may receive a bill from [facility] for your lab work. If you have any questions regarding directions or hours of operation,  please call [phone].    

## 2017-02-21 LAB — CBC WITH DIFFERENTIAL/PLATELET
Basophils Absolute: 48 cells/uL (ref 0–200)
Basophils Relative: 0.5 %
Eosinophils Absolute: 76 cells/uL (ref 15–500)
Eosinophils Relative: 0.8 %
HCT: 35.9 % (ref 35.0–45.0)
Hemoglobin: 12.2 g/dL (ref 11.7–15.5)
Lymphs Abs: 3753 cells/uL (ref 850–3900)
MCH: 30.3 pg (ref 27.0–33.0)
MCHC: 34 g/dL (ref 32.0–36.0)
MCV: 89.3 fL (ref 80.0–100.0)
MPV: 10.6 fL (ref 7.5–12.5)
Monocytes Relative: 6.6 %
Neutro Abs: 4997 cells/uL (ref 1500–7800)
Neutrophils Relative %: 52.6 %
Platelets: 293 10*3/uL (ref 140–400)
RBC: 4.02 10*6/uL (ref 3.80–5.10)
RDW: 13.3 % (ref 11.0–15.0)
Total Lymphocyte: 39.5 %
WBC mixed population: 627 cells/uL (ref 200–950)
WBC: 9.5 10*3/uL (ref 3.8–10.8)

## 2017-02-21 LAB — COMPLETE METABOLIC PANEL WITH GFR
AG Ratio: 1.6 (calc) (ref 1.0–2.5)
ALT: 22 U/L (ref 6–29)
AST: 32 U/L (ref 10–35)
Albumin: 4.1 g/dL (ref 3.6–5.1)
Alkaline phosphatase (APISO): 86 U/L (ref 33–130)
BUN: 7 mg/dL (ref 7–25)
CO2: 26 mmol/L (ref 20–32)
Calcium: 9.4 mg/dL (ref 8.6–10.4)
Chloride: 105 mmol/L (ref 98–110)
Creat: 0.8 mg/dL (ref 0.50–0.99)
GFR, Est African American: 91 mL/min/{1.73_m2} (ref >=60)
GFR, Est Non African American: 78 mL/min/{1.73_m2} (ref >=60)
Globulin: 2.6 g/dL (calc) (ref 1.9–3.7)
Glucose, Bld: 83 mg/dL (ref 65–99)
Potassium: 4.2 mmol/L (ref 3.5–5.3)
Sodium: 139 mmol/L (ref 135–146)
Total Bilirubin: 0.3 mg/dL (ref 0.2–1.2)
Total Protein: 6.7 g/dL (ref 6.1–8.1)

## 2017-02-21 LAB — VITAMIN D 25 HYDROXY (VIT D DEFICIENCY, FRACTURES): Vit D, 25-Hydroxy: 42 ng/mL (ref 30–100)

## 2017-02-22 NOTE — Progress Notes (Signed)
WNL. Should take Vit D 50, 000 U q month or 2000 U qd. Please call in Rx if she prefers once a month option.

## 2017-02-27 ENCOUNTER — Telehealth: Payer: Self-pay | Admitting: *Deleted

## 2017-02-27 MED ORDER — VITAMIN D (ERGOCALCIFEROL) 1.25 MG (50000 UNIT) PO CAPS: 50000.0000 [IU] | ORAL_CAPSULE | ORAL | 1 refills | Status: DC

## 2017-02-27 NOTE — Telephone Encounter (Signed)
-----   Message from Bo Merino, MD sent at 02/22/2017  9:03 PM EDT ----- WNL. Should take Vit D 50, 000 U q month or 2000 U qd. Please call in Rx if she prefers once a month option.

## 2017-02-27 NOTE — Telephone Encounter (Signed)
I LMOVM for patient to call, and rs Ultrasound guided injection appt.

## 2017-03-05 ENCOUNTER — Other Ambulatory Visit: Payer: Self-pay | Admitting: Rheumatology

## 2017-03-05 NOTE — Telephone Encounter (Signed)
Last Visit: 02/20/17 Next Visit: 07/24/17 Labs: 02/23/17 WNL PLQ Eye Exam: 04/2016 WNL  Okay to refill per Dr. Estanislado Pandy

## 2017-03-06 DIAGNOSIS — M79642 Pain in left hand: Secondary | ICD-10-CM | POA: Insufficient documentation

## 2017-03-06 HISTORY — DX: Pain in left hand: M79.642

## 2017-05-30 ENCOUNTER — Other Ambulatory Visit: Payer: Self-pay | Admitting: Rheumatology

## 2017-06-01 NOTE — Telephone Encounter (Signed)
Last visit: 02/20/2017 Next visit: 07/24/2017 Labs: 02/20/2017 WNL  Eye exam: 04/29/2017   Okay to refill per Dr. Estanislado Pandy.

## 2017-07-10 NOTE — Progress Notes (Signed)
Office Visit Note  Patient: Susan Holder             Date of Birth: 04/23/1956           MRN: [id number]             PCP: Patient, No Pcp Per Referring: No ref. provider found Visit Date: 07/24/2017 Occupation: @GUAROCC @    Subjective: Pain in both hands.   History of Present Illness: Susan Holder is a 62 y.o. female with history of seropositive rheumatoid arthritis.  Patient states that she has been having discomfort in her bilateral hands due to trigger finger which she believes is not bilateral third and fourth trigger finger.  She does not have much joint swelling or discomfort from rheumatoid arthritis.  She believes her rheumatoid arthritis is well controlled.  She has been taking Plaquenil 200 mg p.o. twice daily Monday through Friday.  She has occasional discomfort in her ankles.  She states her C-spine is not causing much discomfort currently.  Activities of Daily Living:  Patient reports morning stiffness for 5 minutes.   Patient Denies nocturnal pain.  Difficulty dressing/grooming: Denies Difficulty climbing stairs: Denies Difficulty getting out of chair: Denies Difficulty using hands for taps, buttons, cutlery, and/or writing: Reports   Review of Systems  Constitutional: Positive for fatigue. Negative for night sweats, weight gain and weight loss.  HENT: Negative for mouth sores, trouble swallowing, trouble swallowing, mouth dryness and nose dryness.   Eyes: Negative for pain, redness, visual disturbance and dryness.  Respiratory: Negative for cough, shortness of breath and difficulty breathing.   Cardiovascular: Negative for chest pain, palpitations, hypertension, irregular heartbeat and swelling in legs/feet.  Gastrointestinal: Negative for abdominal pain, blood in stool, constipation and diarrhea.  Endocrine: Negative for increased urination.  Genitourinary: Negative for pelvic pain and vaginal dryness.  Musculoskeletal: Positive for arthralgias, joint  pain and morning stiffness. Negative for joint swelling, myalgias, muscle weakness, muscle tenderness and myalgias.  Skin: Negative for color change, rash, hair loss, skin tightness, ulcers and sensitivity to sunlight.  Allergic/Immunologic: Negative for susceptible to infections.  Neurological: Negative for dizziness, headaches, memory loss, night sweats and weakness.  Hematological: Negative for bruising/bleeding tendency and swollen glands.  Psychiatric/Behavioral: Negative for depressed mood, confusion and sleep disturbance. The patient is not nervous/anxious.     PMFS History:  Patient Active Problem List   Diagnosis Date Noted  . Rheumatoid arthritis involving multiple sites with positive rheumatoid factor ([place]) 05/29/2016  . ANA positive 05/29/2016  . Vitamin D deficiency 05/29/2016  . High risk medication use 05/29/2016  . Trigger finger, left ring finger 05/29/2016  . Trigger finger, right ring finger 05/29/2016  . DDD (degenerative disc disease), cervical 05/29/2016  . Smoker 05/29/2016  . Other fatigue 05/29/2016    Past Medical History:  Diagnosis Date  . Rheumatoid arthritis ([place])     History reviewed. No pertinent family history. Past Surgical History:  Procedure Laterality Date  . TUBAL LIGATION     Social History   Social History Narrative  . Not on file     Objective: Vital Signs: BP (!) 144/86 (BP Location: Right Arm, Patient Position: Sitting, Cuff Size: Normal)   Pulse 80   Resp 15   Ht 5' (1.524 m)   Wt 123 lb (55.8 kg)   LMP 04/28/2000   BMI 24.02 kg/m    Physical Exam  Constitutional: She is oriented to person, place, and time. She appears well-developed and well-nourished.  HENT:  Head: Normocephalic and atraumatic.  Eyes: Conjunctivae and EOM are normal.  Neck: Normal range of motion.  Cardiovascular: Normal rate, regular rhythm, normal heart sounds and intact distal pulses.  Pulmonary/Chest: Effort normal and breath sounds normal.    Abdominal: Soft. Bowel sounds are normal.  Lymphadenopathy:    She has no cervical adenopathy.  Neurological: She is alert and oriented to person, place, and time.  Skin: Skin is warm and dry. Capillary refill takes less than 2 seconds.  Psychiatric: She has a normal mood and affect. Her behavior is normal.  Nursing note and vitals reviewed.    Musculoskeletal Exam: C-spine thoracic lumbar spine good range of motion.  Shoulder joints elbow joints wrist joints with good range of motion.  She is synovial thickening over MCP joints but no active synovitis.  She has a nodule on her right third PIP joint.  She has some thickening of the flexor tendons of bilateral third and fourth flexor digits.  Hip joints, knee joints, ankles and MTPs were in good range of motion with no synovitis.  CDAI Exam: CDAI Homunculus Exam:   Joint Counts:  CDAI Tender Joint count: 0 CDAI Swollen Joint count: 0  Global Assessments:  Patient Global Assessment: 1 Provider Global Assessment: 1  CDAI Calculated Score: 2    Investigation: No additional findings.PLQ eye exam: 04/29/2017 CBC Latest Ref Rng & Units 02/20/2017 11/14/2016 04/30/2016  WBC 3.8 - 10.8 Thousand/uL 9.5 8.9 12.8(H)  Hemoglobin 11.7 - 15.5 g/dL 12.2 12.5 12.8  Hematocrit 35.0 - 45.0 % 35.9 38.1 39.6  Platelets 140 - 400 Thousand/uL 293 287 356   CMP Latest Ref Rng & Units 02/20/2017 11/14/2016 04/30/2016  Glucose 65 - 99 mg/dL 83 82 87  BUN 7 - 25 mg/dL 7 10 9   Creatinine 0.50 - 0.99 mg/dL 0.80 0.88 0.78  Sodium 135 - 146 mmol/L 139 138 141  Potassium 3.5 - 5.3 mmol/L 4.2 4.2 4.7  Chloride 98 - 110 mmol/L 105 104 106  CO2 20 - 32 mmol/L 26 25 24   Calcium 8.6 - 10.4 mg/dL 9.4 9.3 9.6  Total Protein 6.1 - 8.1 g/dL 6.7 6.4 6.8  Total Bilirubin 0.2 - 1.2 mg/dL 0.3 0.3 0.3  Alkaline Phos 33 - 130 U/L - 76 91  AST 10 - 35 U/L 32 19 35  ALT 6 - 29 U/L 22 8 33(H)    Imaging: No results found.  Speciality Comments: PLQ eye exam: 04/29/2017  Normal. [facility]. Follow up in 12 months.    Procedures:  No procedures performed Allergies: Patient has no known allergies.   Assessment / Plan:     Visit Diagnoses: Rheumatoid arthritis involving multiple sites with positive rheumatoid factor (HCC) - +RF, +anti-CCP, +ANA with nodulosis.  Patient has no active synovitis.  She states she has intermittent arthralgias.  She has subluxation of her left first MCP joint.  She has seen Dr. Fredna Dow for that.  He advised only fusion had no other surgery.  Patient declined the surgery.  High risk medication use - PLQ eye exam: 04/29/2017 - Plan: CBC with Differential/Platelet, COMPLETE METABOLIC PANEL WITH GFR today and then every 5 months.  Trigger middle finger of bilateral hands: Her symptoms are intermittent and she would like to wait before getting injections.  Trigger ring finger of bilateral hands: She has only intermittent symptoms and she would like to wait before getting injections.  DDD (degenerative disc disease), cervical: Some stiffness.  Smoker smoking cessation was discussed.  Association  of smoking with rheumatoid arthritis was discussed.  History of vitamin D deficiency -she is on maintenance vitamin D.  Plan: VITAMIN D 25 Hydroxy (Vit-D Deficiency, Fractures)    Orders: Orders Placed This Encounter  Procedures  . CBC with Differential/Platelet  . COMPLETE METABOLIC PANEL WITH GFR  . VITAMIN D 25 Hydroxy (Vit-D Deficiency, Fractures)   No orders of the defined types were placed in this encounter.   Face-to-face time spent with patient was 20 minutes.  Greater than 50% of time was spent in counseling and coordination of care.  Follow-Up Instructions: Return in about 5 months (around 12/24/2017) for Rheumatoid arthritis, DDD.   Bo Merino, MD  Note - This record has been created using Editor, commissioning.  Chart creation errors have been sought, but may not always  have been located. Such creation errors do not  reflect on  the standard of medical care.

## 2017-07-24 ENCOUNTER — Encounter: Payer: Self-pay | Admitting: Rheumatology

## 2017-07-24 ENCOUNTER — Ambulatory Visit: Payer: BC Managed Care – PPO | Admitting: Rheumatology

## 2017-07-24 VITALS — BP 144/86 | HR 80 | Resp 15 | Ht 60.0 in | Wt 123.0 lb

## 2017-07-24 DIAGNOSIS — Z79899 Other long term (current) drug therapy: Secondary | ICD-10-CM | POA: Diagnosis not present

## 2017-07-24 DIAGNOSIS — M5030 Other cervical disc degeneration, unspecified cervical region: Secondary | ICD-10-CM

## 2017-07-24 DIAGNOSIS — Z8639 Personal history of other endocrine, nutritional and metabolic disease: Secondary | ICD-10-CM

## 2017-07-24 DIAGNOSIS — M0579 Rheumatoid arthritis with rheumatoid factor of multiple sites without organ or systems involvement: Secondary | ICD-10-CM | POA: Diagnosis not present

## 2017-07-24 DIAGNOSIS — F17200 Nicotine dependence, unspecified, uncomplicated: Secondary | ICD-10-CM

## 2017-07-24 DIAGNOSIS — M65341 Trigger finger, right ring finger: Secondary | ICD-10-CM | POA: Diagnosis not present

## 2017-07-24 DIAGNOSIS — M65331 Trigger finger, right middle finger: Secondary | ICD-10-CM | POA: Diagnosis not present

## 2017-07-25 LAB — CBC WITH DIFFERENTIAL/PLATELET
Basophils Absolute: 47 cells/uL (ref 0–200)
Basophils Relative: 0.5 %
Eosinophils Absolute: 102 cells/uL (ref 15–500)
Eosinophils Relative: 1.1 %
HCT: 38.9 % (ref 35.0–45.0)
Hemoglobin: 13.4 g/dL (ref 11.7–15.5)
Lymphs Abs: 3683 cells/uL (ref 850–3900)
MCH: 30.9 pg (ref 27.0–33.0)
MCHC: 34.4 g/dL (ref 32.0–36.0)
MCV: 89.8 fL (ref 80.0–100.0)
MPV: 10.3 fL (ref 7.5–12.5)
Monocytes Relative: 6.7 %
Neutro Abs: 4845 cells/uL (ref 1500–7800)
Neutrophils Relative %: 52.1 %
Platelets: 335 10*3/uL (ref 140–400)
RBC: 4.33 10*6/uL (ref 3.80–5.10)
RDW: 13 % (ref 11.0–15.0)
Total Lymphocyte: 39.6 %
WBC mixed population: 623 cells/uL (ref 200–950)
WBC: 9.3 10*3/uL (ref 3.8–10.8)

## 2017-07-25 LAB — COMPLETE METABOLIC PANEL WITH GFR
AG Ratio: 1.4 (calc) (ref 1.0–2.5)
ALT: 13 U/L (ref 6–29)
AST: 20 U/L (ref 10–35)
Albumin: 4.2 g/dL (ref 3.6–5.1)
Alkaline phosphatase (APISO): 82 U/L (ref 33–130)
BUN: 9 mg/dL (ref 7–25)
CO2: 28 mmol/L (ref 20–32)
Calcium: 9.7 mg/dL (ref 8.6–10.4)
Chloride: 105 mmol/L (ref 98–110)
Creat: 0.8 mg/dL (ref 0.50–0.99)
GFR, Est African American: 91 mL/min/{1.73_m2} (ref >=60)
GFR, Est Non African American: 78 mL/min/{1.73_m2} (ref >=60)
Globulin: 2.9 g/dL (calc) (ref 1.9–3.7)
Glucose, Bld: 83 mg/dL (ref 65–99)
Potassium: 4.8 mmol/L (ref 3.5–5.3)
Sodium: 138 mmol/L (ref 135–146)
Total Bilirubin: 0.3 mg/dL (ref 0.2–1.2)
Total Protein: 7.1 g/dL (ref 6.1–8.1)

## 2017-07-25 LAB — VITAMIN D 25 HYDROXY (VIT D DEFICIENCY, FRACTURES): Vit D, 25-Hydroxy: 22 ng/mL — ABNORMAL LOW (ref 30–100)

## 2017-07-27 ENCOUNTER — Telehealth: Payer: Self-pay | Admitting: *Deleted

## 2017-07-27 DIAGNOSIS — Z8639 Personal history of other endocrine, nutritional and metabolic disease: Secondary | ICD-10-CM

## 2017-07-27 MED ORDER — VITAMIN D (ERGOCALCIFEROL) 1.25 MG (50000 UNIT) PO CAPS: 50000.0000 [IU] | ORAL_CAPSULE | ORAL | 0 refills | Status: DC

## 2017-07-27 NOTE — Telephone Encounter (Signed)
-----   Message from Ofilia Neas, PA-C sent at 07/27/2017  8:29 AM EDT ----- Vitamin D is low.  Please send in Vitamin D 50,000 units by mouth once weekly for 3 months.  We will recheck vitamin D in 3 months.  All other labs are WNL.

## 2017-09-07 ENCOUNTER — Ambulatory Visit: Payer: BC Managed Care – PPO | Admitting: Podiatry

## 2017-09-07 ENCOUNTER — Other Ambulatory Visit: Payer: Self-pay

## 2017-09-07 ENCOUNTER — Encounter: Payer: Self-pay | Admitting: Podiatry

## 2017-09-07 DIAGNOSIS — L989 Disorder of the skin and subcutaneous tissue, unspecified: Secondary | ICD-10-CM | POA: Diagnosis not present

## 2017-09-09 NOTE — Progress Notes (Signed)
   Subjective: 62 year old female presenting today as a new patient with a chief complaint of a painful lesion noted to the right heel that has been present for several months. She was referred by Dr. Gershon Mussel and was diagnosed with a lipoma and recommended to have surgical excision. She has not done anything for treatment. Bearing weight and walking increases the pain. Patient is here for further evaluation and treatment.   Past Medical History:  Diagnosis Date  . Rheumatoid arthritis ([place])      Objective:  Physical Exam General: Alert and oriented x3 in no acute distress  Dermatology: Hyperkeratotic lesion present on the posterior right heel. Pain on palpation with a central nucleated core noted. Skin is warm, dry and supple bilateral lower extremities. Negative for open lesions or macerations.  Vascular: Palpable pedal pulses bilaterally. No edema or erythema noted. Capillary refill within normal limits.  Neurological: Epicritic and protective threshold grossly intact bilaterally.   Musculoskeletal Exam: Pain on palpation at the keratotic lesion noted. Range of motion within normal limits bilateral. Muscle strength 5/5 in all groups bilateral.  Assessment: 1. Porokeratosis posterior right heel    Plan of Care:  1. Patient evaluated 2. Excisional debridement of keratoic lesion using a chisel blade was performed without incident.  3. Dressed area with light dressing. 4. Patient is to return to the clinic in 4 weeks. If not better, we will discuss surgery for lesion excision.   Edrick Kins, DPM [facility]  Dr. Edrick Kins, [place]                                        [place], [place] [address]                Office [phone]  Fax [phone]

## 2017-09-12 ENCOUNTER — Other Ambulatory Visit: Payer: Self-pay | Admitting: Rheumatology

## 2017-09-14 NOTE — Telephone Encounter (Signed)
Last visit: 07/24/17 Next Visit: 12/24/17 Labs: 07/24/17 cbc/cmp wnl PLQ eye exam: 04/29/2017 Normal.  Okay to refill per Dr. Estanislado Pandy

## 2017-09-30 ENCOUNTER — Other Ambulatory Visit (HOSPITAL_COMMUNITY): Payer: Self-pay | Admitting: Nurse Practitioner

## 2017-09-30 DIAGNOSIS — R918 Other nonspecific abnormal finding of lung field: Secondary | ICD-10-CM

## 2017-10-05 ENCOUNTER — Ambulatory Visit: Payer: BC Managed Care – PPO | Admitting: Podiatry

## 2017-10-06 ENCOUNTER — Encounter (HOSPITAL_COMMUNITY)
Admission: RE | Admit: 2017-10-06 | Discharge: 2017-10-06 | Disposition: A | Discharge status: Home / Self Care | Payer: BC Managed Care – PPO | Source: Ambulatory Visit | Attending: Nurse Practitioner | Admitting: Nurse Practitioner

## 2017-10-06 DIAGNOSIS — R918 Other nonspecific abnormal finding of lung field: Secondary | ICD-10-CM

## 2017-10-06 LAB — GLUCOSE, CAPILLARY: Glucose-Capillary: 105 mg/dL — ABNORMAL HIGH (ref 65–99)

## 2017-10-06 MED ORDER — FLUDEOXYGLUCOSE F - 18 (FDG) INJECTION
7.3000 | Freq: Once | INTRAVENOUS | Status: AC
  Administered 2017-10-06: 7.3 via INTRAVENOUS

## 2017-10-16 ENCOUNTER — Other Ambulatory Visit: Payer: Self-pay | Admitting: Rheumatology

## 2017-10-17 ENCOUNTER — Other Ambulatory Visit: Payer: Self-pay | Admitting: Rheumatology

## 2017-10-19 NOTE — Telephone Encounter (Signed)
Patient advised she will need to have her Vitamin D rechecked before refill.

## 2017-10-19 NOTE — Telephone Encounter (Addendum)
To soon for refill.

## 2017-10-26 ENCOUNTER — Telehealth: Payer: Self-pay | Admitting: Rheumatology

## 2017-10-26 MED ORDER — HYDROXYCHLOROQUINE SULFATE 200 MG PO TABS: ORAL_TABLET | ORAL | 2 refills | Status: DC

## 2017-10-26 NOTE — Telephone Encounter (Signed)
Last visit: 07/24/17 Next Visit: 12/24/17 Labs: 07/24/17 cbc/cmp wnl PLQ eye exam: 04/29/2017 Normal.  Okay to refill per Dr. Estanislado Pandy

## 2017-10-26 NOTE — Telephone Encounter (Signed)
A representative from [facility] left a voicemail requesting an updated prescription for Plaquenil.  The prescription that they state they have on file is for 200 mg Monday Friday and patient states she was told to take the prescription twice per day Monday - Friday.  Please return our call at [phone]

## 2017-11-02 ENCOUNTER — Encounter: Payer: Self-pay | Admitting: Pulmonary Disease

## 2017-11-02 ENCOUNTER — Ambulatory Visit: Payer: BC Managed Care – PPO | Admitting: Pulmonary Disease

## 2017-11-02 ENCOUNTER — Other Ambulatory Visit (INDEPENDENT_AMBULATORY_CARE_PROVIDER_SITE_OTHER): Payer: BC Managed Care – PPO

## 2017-11-02 VITALS — BP 124/74 | HR 82 | Ht 60.0 in | Wt 128.0 lb

## 2017-11-02 DIAGNOSIS — R918 Other nonspecific abnormal finding of lung field: Secondary | ICD-10-CM

## 2017-11-02 LAB — CBC WITH DIFFERENTIAL/PLATELET
Basophils Absolute: 0 10*3/uL (ref 0.0–0.1)
Basophils Relative: 0.5 % (ref 0.0–3.0)
Eosinophils Absolute: 0.1 10*3/uL (ref 0.0–0.7)
Eosinophils Relative: 1.3 % (ref 0.0–5.0)
HCT: 39.5 % (ref 36.0–46.0)
Hemoglobin: 13.4 g/dL (ref 12.0–15.0)
Lymphocytes Relative: 33 % (ref 12.0–46.0)
Lymphs Abs: 2.9 10*3/uL (ref 0.7–4.0)
MCHC: 33.8 g/dL (ref 30.0–36.0)
MCV: 92.3 fl (ref 78.0–100.0)
Monocytes Absolute: 1.1 10*3/uL — ABNORMAL HIGH (ref 0.1–1.0)
Monocytes Relative: 12.4 % — ABNORMAL HIGH (ref 3.0–12.0)
Neutro Abs: 4.6 10*3/uL (ref 1.4–7.7)
Neutrophils Relative %: 52.8 % (ref 43.0–77.0)
Platelets: 394 10*3/uL (ref 150.0–400.0)
RBC: 4.28 Mil/uL (ref 3.87–5.11)
RDW: 14.3 % (ref 11.5–15.5)
WBC: 8.8 10*3/uL (ref 4.0–10.5)

## 2017-11-02 LAB — PROTIME-INR
INR: 1.2 ratio — ABNORMAL HIGH (ref 0.8–1.0)
Prothrombin Time: 13.8 s — ABNORMAL HIGH (ref 9.6–13.1)

## 2017-11-02 NOTE — Patient Instructions (Signed)
We will get labs today including CBC, PT/INR We will schedule you for a bronchoscope with biopsy of the lung mass.  We will call you with the time and date as soon as we can Follow-up in 1 month.

## 2017-11-02 NOTE — Progress Notes (Signed)
Susan Holder    [id number]    02/21/1956  Primary Care Physician:Patient, No Pcp Per  Referring Physician: No referring provider defined for this encounter.  Chief complaint: Consult for lung mass  HPI: 62 year old ex-smoker with new diagnosis of right hilar lung mass Evaluated at her primary care for recurrent bronchitis with chest x-ray that showed a right hilar opacity.  Subsequent CT scan and PET scan confirmed highly PET avid right hilar mass.  She has been referred to pulmonary for further evaluation  Complains of chronic cough, nonproductive in nature.  Denies any loss of weight, loss of appetite, hemoptysis.  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  Quit in July 2019 Travel history: No significant travel history  Outpatient Encounter Medications as of 11/02/2017  Medication Sig  . aspirin EC 81 MG tablet Take 81 mg by mouth daily.  . hydroxychloroquine (PLAQUENIL) 200 MG tablet Take 200 mg po BID Monday Friday  . nicotine (NICODERM CQ - DOSED IN MG/24 HR) 7 mg/24hr patch PLACE 1 PATCH ONTO THE SKIN QD. USE AFTER COMPLETING 14MG /DAY PATCH  . tobramycin-dexamethasone (TOBRADEX) ophthalmic solution INT 1 GTT OU QID  . Vitamin D, Ergocalciferol, (DRISDOL) 50000 units CAPS capsule Take 1 capsule (50,000 Units total) by mouth every 30 (thirty) days.  . [DISCONTINUED] cetirizine (ZYRTEC) 10 MG tablet Take 10 mg by mouth daily.  . [DISCONTINUED] Vitamin D, Ergocalciferol, (DRISDOL) 50000 units CAPS capsule Take 1 capsule (50,000 Units total) by mouth every 7 (seven) days.  . [DISCONTINUED] nicotine (NICODERM CQ - DOSED IN MG/24 HOURS) 14 mg/24hr patch APPLY 1 PATCH ONTO THE SKIN DAILY. USE AFTER TWO WEEKS ON 21MG /DAY PATCH   No facility-administered encounter medications on file as of 11/02/2017.     Allergies as of 11/02/2017  . (No Known  Allergies)    Past Medical History:  Diagnosis Date  . Rheumatoid arthritis [facility])     Past Surgical History:  Procedure Laterality Date  . NASAL SINUS SURGERY    . TUBAL LIGATION      Family History  Problem Relation Age of Onset  . Stroke Mother   . Alzheimer's disease Mother   . Heart disease Mother   . Emphysema Father   . Hypertension Brother   . Heart attack Maternal Aunt   . Heart failure Maternal Grandmother   . Hypertension Paternal Grandmother     Social History   Socioeconomic History  . Marital status: Divorced    Spouse name: Not on file  . Number of children: Not on file  . Years of education: Not on file  . Highest education level: Not on file  Occupational History  . Not on file  Social Needs  . Financial resource strain: Not on file  . Food insecurity:    Worry: Not on file    Inability: Not on file  . Transportation needs:    Medical: Not on file    Non-medical: Not on file  Tobacco Use  . Smoking status: Former Smoker    Packs/day: 0.50    Years: 45.00    Pack years: 22.50    Types: Cigarettes    Last attempt to quit: 10/03/2017    Years since quitting: 0.0  . Smokeless tobacco: Never Used  Substance and Sexual Activity  . Alcohol use: Not Currently    Comment: rare  .  Drug use: No  . Sexual activity: Not on file  Lifestyle  . Physical activity:    Days per week: Not on file    Minutes per session: Not on file  . Stress: Not on file  Relationships  . Social connections:    Talks on phone: Not on file    Gets together: Not on file    Attends religious service: Not on file    Active member of club or organization: Not on file    Attends meetings of clubs or organizations: Not on file    Relationship status: Not on file  . Intimate partner violence:    Fear of current or ex partner: Not on file    Emotionally abused: Not on file    Physically abused: Not on file    Forced sexual activity: Not on file  Other Topics Concern  .  Not on file  Social History Narrative  . Not on file    Review of systems: Review of Systems  Constitutional: Negative for fever and chills.  HENT: Negative.   Eyes: Negative for blurred vision.  Respiratory: as per HPI  Cardiovascular: Negative for chest pain and palpitations.  Gastrointestinal: Negative for vomiting, diarrhea, blood per rectum. Genitourinary: Negative for dysuria, urgency, frequency and hematuria.  Musculoskeletal: Negative for myalgias, back pain and joint pain.  Skin: Negative for itching and rash.  Neurological: Negative for dizziness, tremors, focal weakness, seizures and loss of consciousness.  Endo/Heme/Allergies: Negative for environmental allergies.  Psychiatric/Behavioral: Negative for depression, suicidal ideas and hallucinations.  All other systems reviewed and are negative.  Physical Exam: Blood pressure 124/74, pulse 82, height 5' (1.524 m), weight 128 lb (58.1 kg), last menstrual period 04/28/2000, SpO2 98 %. Gen:      No acute distress HEENT:  EOMI, sclera anicteric Neck:     No masses; no thyromegaly Lungs:    Clear to auscultation bilaterally; normal respiratory effort CV:         Regular rate and rhythm; no murmurs Abd:      + bowel sounds; soft, non-tender; no palpable masses, no distension Ext:    No edema; adequate peripheral perfusion Skin:      Warm and dry; no rash Neuro: alert and oriented x 3 Psych: normal mood and affect  Data Reviewed: CT chest 09/29/17- 4.6 x 3.9 cm mass at the right hilum as described above likely representing primary pulmonary malignancy. Associated adjacent subcarinal adenopathy. PET CT may be useful for further evaluation.  PET scan 10/06/17- right hilar mass, highly PET avid.  Extends into the subcarinal region.  No other mediastinal, supraclavicular lymph nodes.  I reviewed the images personally.  Assessment:  Evaluation for right hilar mass Highly suspicious for malignancy Scheduled for endobronchial  ultrasound with biopsy Risk-benefit discussed with the patient and she has agreed to proceed Check CBC, PT/INR in preparation for procedure.    More then 1/2 the time of the 40 min visit was spent in counseling and/or coordination of care with the patient and family.  Plan/Recommendations: - Schedule bronch with EBUS, biopsy - CBC. PT, INR  Marshell Garfinkel MD [facility] 11/02/2017, 4:37 PM

## 2017-11-04 ENCOUNTER — Other Ambulatory Visit: Payer: Self-pay

## 2017-11-04 ENCOUNTER — Encounter (HOSPITAL_COMMUNITY): Payer: Self-pay

## 2017-11-09 ENCOUNTER — Ambulatory Visit (HOSPITAL_COMMUNITY): Payer: BC Managed Care – PPO

## 2017-11-09 ENCOUNTER — Ambulatory Visit (HOSPITAL_COMMUNITY): Payer: BC Managed Care – PPO | Admitting: Anesthesiology

## 2017-11-09 ENCOUNTER — Other Ambulatory Visit: Payer: Self-pay | Admitting: *Deleted

## 2017-11-09 ENCOUNTER — Ambulatory Visit (HOSPITAL_COMMUNITY)
Admission: RE | Admit: 2017-11-09 | Discharge: 2017-11-09 | Disposition: A | Discharge status: Home / Self Care | Payer: BC Managed Care – PPO | Source: Ambulatory Visit | Attending: Pulmonary Disease | Admitting: Pulmonary Disease

## 2017-11-09 ENCOUNTER — Encounter (HOSPITAL_COMMUNITY)
Admission: RE | Disposition: A | Discharge status: Home / Self Care | Payer: Self-pay | Source: Ambulatory Visit | Attending: Pulmonary Disease

## 2017-11-09 ENCOUNTER — Encounter (HOSPITAL_COMMUNITY): Payer: BC Managed Care – PPO

## 2017-11-09 ENCOUNTER — Encounter (HOSPITAL_COMMUNITY): Payer: Self-pay | Admitting: *Deleted

## 2017-11-09 ENCOUNTER — Other Ambulatory Visit: Payer: Self-pay

## 2017-11-09 DIAGNOSIS — Z9889 Other specified postprocedural states: Secondary | ICD-10-CM

## 2017-11-09 DIAGNOSIS — C3401 Malignant neoplasm of right main bronchus: Secondary | ICD-10-CM | POA: Diagnosis not present

## 2017-11-09 DIAGNOSIS — Z87891 Personal history of nicotine dependence: Secondary | ICD-10-CM | POA: Insufficient documentation

## 2017-11-09 DIAGNOSIS — M069 Rheumatoid arthritis, unspecified: Secondary | ICD-10-CM | POA: Insufficient documentation

## 2017-11-09 DIAGNOSIS — R918 Other nonspecific abnormal finding of lung field: Secondary | ICD-10-CM | POA: Diagnosis not present

## 2017-11-09 DIAGNOSIS — Z7982 Long term (current) use of aspirin: Secondary | ICD-10-CM | POA: Insufficient documentation

## 2017-11-09 DIAGNOSIS — C3490 Malignant neoplasm of unspecified part of unspecified bronchus or lung: Secondary | ICD-10-CM

## 2017-11-09 DIAGNOSIS — Z79899 Other long term (current) drug therapy: Secondary | ICD-10-CM | POA: Insufficient documentation

## 2017-11-09 HISTORY — PX: BRONCHIAL NEEDLE ASPIRATION BIOPSY: SHX5106

## 2017-11-09 HISTORY — PX: ENDOBRONCHIAL ULTRASOUND: SHX5096

## 2017-11-09 HISTORY — PX: VIDEO BRONCHOSCOPY: SHX5072

## 2017-11-09 SURGERY — ENDOBRONCHIAL ULTRASOUND (EBUS)
Anesthesia: General | Laterality: Bilateral

## 2017-11-09 MED ORDER — PHENYLEPHRINE 40 MCG/ML (10ML) SYRINGE FOR IV PUSH (FOR BLOOD PRESSURE SUPPORT)
PREFILLED_SYRINGE | INTRAVENOUS | Status: DC | PRN
  Administered 2017-11-09: 80 ug via INTRAVENOUS
  Administered 2017-11-09: 160 ug via INTRAVENOUS
  Administered 2017-11-09: 120 ug via INTRAVENOUS

## 2017-11-09 MED ORDER — ONDANSETRON HCL 4 MG/2ML IJ SOLN
INTRAMUSCULAR | Status: DC | PRN
  Administered 2017-11-09: 4 mg via INTRAVENOUS

## 2017-11-09 MED ORDER — SUCCINYLCHOLINE CHLORIDE 20 MG/ML IJ SOLN
INTRAMUSCULAR | Status: DC | PRN
  Administered 2017-11-09: 100 mg via INTRAVENOUS

## 2017-11-09 MED ORDER — PROPOFOL 10 MG/ML IV BOLUS
INTRAVENOUS | Status: AC
  Filled 2017-11-09: qty 20

## 2017-11-09 MED ORDER — PROPOFOL 10 MG/ML IV BOLUS
INTRAVENOUS | Status: DC | PRN
  Administered 2017-11-09: 150 mg via INTRAVENOUS

## 2017-11-09 MED ORDER — EPINEPHRINE PF 1 MG/10ML IJ SOSY
PREFILLED_SYRINGE | INTRAMUSCULAR | Status: DC | PRN
  Administered 2017-11-09: 300 ug via INTRAVENOUS

## 2017-11-09 MED ORDER — FENTANYL CITRATE (PF) 100 MCG/2ML IJ SOLN
INTRAMUSCULAR | Status: DC | PRN
  Administered 2017-11-09 (×2): 50 ug via INTRAVENOUS
  Administered 2017-11-09: 100 ug via INTRAVENOUS

## 2017-11-09 MED ORDER — ALBUTEROL SULFATE HFA 108 (90 BASE) MCG/ACT IN AERS
INHALATION_SPRAY | RESPIRATORY_TRACT | Status: DC | PRN
  Administered 2017-11-09: 8 via RESPIRATORY_TRACT

## 2017-11-09 MED ORDER — MIDAZOLAM HCL 5 MG/5ML IJ SOLN
INTRAMUSCULAR | Status: DC | PRN
  Administered 2017-11-09: 2 mg via INTRAVENOUS

## 2017-11-09 MED ORDER — EPHEDRINE SULFATE-NACL 50-0.9 MG/10ML-% IV SOSY
PREFILLED_SYRINGE | INTRAVENOUS | Status: DC | PRN
  Administered 2017-11-09: 15 mg via INTRAVENOUS

## 2017-11-09 MED ORDER — LACTATED RINGERS IV SOLN
INTRAVENOUS | Status: DC
  Administered 2017-11-09 (×3): via INTRAVENOUS

## 2017-11-09 MED ORDER — MIDAZOLAM HCL 2 MG/2ML IJ SOLN
INTRAMUSCULAR | Status: AC
  Filled 2017-11-09: qty 2

## 2017-11-09 MED ORDER — FENTANYL CITRATE (PF) 100 MCG/2ML IJ SOLN
INTRAMUSCULAR | Status: AC
  Filled 2017-11-09: qty 2

## 2017-11-09 MED ORDER — LIDOCAINE 2% (20 MG/ML) 5 ML SYRINGE
INTRAMUSCULAR | Status: DC | PRN
  Administered 2017-11-09: 60 mg via INTRAVENOUS

## 2017-11-09 NOTE — H&P (Signed)
Susan Holder    [id number]    1955-06-14  Primary Care Physician:Patient, No Pcp Per  Referring Physician: No referring provider defined for this encounter.  Chief complaint: Follow up for lung mass  HPI: 62 year old ex-smoker with new diagnosis of right hilar lung mass Evaluated at her primary care for recurrent bronchitis with chest x-ray that showed a right hilar opacity.  Subsequent CT scan and PET scan confirmed highly PET avid right hilar mass.  She has been referred to pulmonary for further evaluation  Complains of chronic cough, nonproductive in nature.  Denies any loss of weight, loss of appetite, hemoptysis.  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  Quit in July 2019 Travel history: No significant travel history  Interim history: Patient scheduled for bronchoscopy, EBUS.  Examined in the preop area No new complaints.  Outpatient Encounter Medications as of 11/02/2017  Medication Sig  . aspirin EC 81 MG tablet Take 81 mg by mouth daily.  . hydroxychloroquine (PLAQUENIL) 200 MG tablet Take 200 mg po BID Monday Friday  . nicotine (NICODERM CQ - DOSED IN MG/24 HR) 7 mg/24hr patch PLACE 1 PATCH ONTO THE SKIN QD. USE AFTER COMPLETING 14MG /DAY PATCH  . tobramycin-dexamethasone (TOBRADEX) ophthalmic solution INT 1 GTT OU QID  . Vitamin D, Ergocalciferol, (DRISDOL) 50000 units CAPS capsule Take 1 capsule (50,000 Units total) by mouth every 30 (thirty) days.  . [DISCONTINUED] cetirizine (ZYRTEC) 10 MG tablet Take 10 mg by mouth daily.  . [DISCONTINUED] Vitamin D, Ergocalciferol, (DRISDOL) 50000 units CAPS capsule Take 1 capsule (50,000 Units total) by mouth every 7 (seven) days.  . [DISCONTINUED] nicotine (NICODERM CQ - DOSED IN MG/24 HOURS) 14 mg/24hr patch APPLY 1 PATCH ONTO THE SKIN DAILY. USE AFTER TWO WEEKS ON 21MG /DAY PATCH   No  facility-administered encounter medications on file as of 11/02/2017.     Allergies as of 11/03/2017  . (No Known Allergies)    Past Medical History:  Diagnosis Date  . Rheumatoid arthritis [facility])     Past Surgical History:  Procedure Laterality Date  . BACK SURGERY    . NASAL SINUS SURGERY    . NASAL SINUS SURGERY    . TUBAL LIGATION      Family History  Problem Relation Age of Onset  . Stroke Mother   . Alzheimer's disease Mother   . Heart disease Mother   . Emphysema Father   . Hypertension Brother   . Heart attack Maternal Aunt   . Heart failure Maternal Grandmother   . Hypertension Paternal Grandmother     Social History   Socioeconomic History  . Marital status: Divorced    Spouse name: Not on file  . Number of children: Not on file  . Years of education: Not on file  . Highest education level: Not on file  Occupational History  . Not on file  Social Needs  . Financial resource strain: Not on file  . Food insecurity:    Worry: Not on file    Inability: Not on file  . Transportation needs:    Medical: Not on file    Non-medical: Not on file  Tobacco Use  . Smoking status: Former Smoker    Packs/day: 0.50    Years: 45.00    Pack years: 22.50    Types: Cigarettes    Last attempt to  quit: 09/29/2017    Years since quitting: 0.1  . Smokeless tobacco: Never Used  Substance and Sexual Activity  . Alcohol use: Not Currently    Comment: rare  . Drug use: No  . Sexual activity: Not on file  Lifestyle  . Physical activity:    Days per week: Not on file    Minutes per session: Not on file  . Stress: Not on file  Relationships  . Social connections:    Talks on phone: Not on file    Gets together: Not on file    Attends religious service: Not on file    Active member of club or organization: Not on file    Attends meetings of clubs or organizations: Not on file    Relationship status: Not on file  . Intimate partner violence:    Fear of current or ex  partner: Not on file    Emotionally abused: Not on file    Physically abused: Not on file    Forced sexual activity: Not on file  Other Topics Concern  . Not on file  Social History Narrative  . Not on file    Review of systems: Review of Systems  Constitutional: Negative for fever and chills.  HENT: Negative.   Eyes: Negative for blurred vision.  Respiratory: as per HPI  Cardiovascular: Negative for chest pain and palpitations.  Gastrointestinal: Negative for vomiting, diarrhea, blood per rectum. Genitourinary: Negative for dysuria, urgency, frequency and hematuria.  Musculoskeletal: Negative for myalgias, back pain and joint pain.  Skin: Negative for itching and rash.  Neurological: Negative for dizziness, tremors, focal weakness, seizures and loss of consciousness.  Endo/Heme/Allergies: Negative for environmental allergies.  Psychiatric/Behavioral: Negative for depression, suicidal ideas and hallucinations.  All other systems reviewed and are negative.  Physical Exam: Blood pressure (!) 141/70, pulse 78, temperature 98.3 F (36.8 C), temperature source Oral, resp. rate 17, height 5' (1.524 m), weight 128 lb (58.1 kg), last menstrual period 04/28/2000, SpO2 97 %. Gen:      No acute distress HEENT:  EOMI, sclera anicteric Neck:     No masses; no thyromegaly Lungs:    Clear to auscultation bilaterally; normal respiratory effort CV:         Regular rate and rhythm; no murmurs Abd:      + bowel sounds; soft, non-tender; no palpable masses, no distension Ext:    No edema; adequate peripheral perfusion Skin:      Warm and dry; no rash Neuro: alert and oriented x 3 Psych: normal mood and affect  Data Reviewed: CT chest 09/29/17- 4.6 x 3.9 cm mass at the right hilum as described above likely representing primary pulmonary malignancy. Associated adjacent subcarinal adenopathy. PET CT may be useful for further evaluation.  PET scan 10/06/17- right hilar mass, highly PET avid.   Extends into the subcarinal region.  No other mediastinal, supraclavicular lymph nodes.  I reviewed the images personally.  Assessment:  Evaluation for right hilar mass Highly suspicious for malignancy Scheduled for endobronchial ultrasound with biopsy Risk-benefit discussed with the patient. Labs reviewed.  Proceed with bronchoscopy.  Plan/Recommendations: - [place] with EBUS, biopsy  Marshell Garfinkel MD [facility] 11/09/2017, 1:07 PM

## 2017-11-09 NOTE — Anesthesia Procedure Notes (Signed)
Procedure Name: Intubation Performed by: Gean Maidens, CRNA Pre-anesthesia Checklist: Patient identified, Emergency Drugs available, Suction available, Patient being monitored and Timeout performed Patient Re-evaluated:Patient Re-evaluated prior to induction Oxygen Delivery Method: Circle system utilized Preoxygenation: Pre-oxygenation with 100% oxygen Laryngoscope Size: Glidescope and 4 Grade View: Grade I Tube type: Oral Tube size: 8.5 mm Number of attempts: 1 Airway Equipment and Method: Video-laryngoscopy and Stylet Placement Confirmation: ETT inserted through vocal cords under direct vision,  positive ETCO2,  CO2 detector and breath sounds checked- equal and bilateral Secured at: 22 cm Tube secured with: Tape Dental Injury: Teeth and Oropharynx as per pre-operative assessment

## 2017-11-09 NOTE — Progress Notes (Signed)
Patient's oxygen saturation post procedure was about 88-90% on room air. Encouraged use of incentive spirometer, which patient completed a few times here, and still remaining the same. Paged MD Mannam, he said he was going to arrange home oxygen to be delivered to her home via the clinic but that it was okay to go ahead and send her home. Relayed that information to the patient and her family at time of discharge.

## 2017-11-09 NOTE — Progress Notes (Signed)
Order placed for pt to have O2 per Dr. Vaughan Browner. Nothing further needed.

## 2017-11-09 NOTE — Anesthesia Preprocedure Evaluation (Signed)
Anesthesia Evaluation  Patient identified by MRN, date of birth, ID band Patient awake    Reviewed: Allergy & Precautions, NPO status , Patient's Chart, lab work & pertinent test results  Airway Mallampati: II  TM Distance: >3 FB Neck ROM: Full    Dental no notable dental hx.    Pulmonary neg pulmonary ROS, former smoker,    Pulmonary exam normal breath sounds clear to auscultation       Cardiovascular negative cardio ROS Normal cardiovascular exam Rhythm:Regular Rate:Normal     Neuro/Psych negative neurological ROS  negative psych ROS   GI/Hepatic negative GI ROS, Neg liver ROS,   Endo/Other  negative endocrine ROS  Renal/GU negative Renal ROS  negative genitourinary   Musculoskeletal  (+) Arthritis , Rheumatoid disorders,    Abdominal   Peds negative pediatric ROS (+)  Hematology negative hematology ROS (+)   Anesthesia Other Findings   Reproductive/Obstetrics negative OB ROS                             Anesthesia Physical Anesthesia Plan  ASA: II  Anesthesia Plan: General   Post-op Pain Management:    Induction: Intravenous  PONV Risk Score and Plan: 3 and Ondansetron, Dexamethasone and Treatment may vary due to age or medical condition  Airway Management Planned: Oral ETT  Additional Equipment:   Intra-op Plan:   Post-operative Plan: Extubation in OR  Informed Consent: I have reviewed the patients History and Physical, chart, labs and discussed the procedure including the risks, benefits and alternatives for the proposed anesthesia with the patient or authorized representative who has indicated his/her understanding and acceptance.   Dental advisory given  Plan Discussed with: CRNA and Surgeon  Anesthesia Plan Comments:         Anesthesia Quick Evaluation

## 2017-11-09 NOTE — Anesthesia Postprocedure Evaluation (Signed)
Anesthesia Post Note  Patient: Susan Holder  Procedure(s) Performed: ENDOBRONCHIAL ULTRASOUND (Bilateral ) VIDEO BRONCHOSCOPY BRONCHIAL NEEDLE ASPIRATION BIOPSIES     Patient location during evaluation: PACU Anesthesia Type: General Level of consciousness: sedated Pain management: pain level controlled Vital Signs Assessment: post-procedure vital signs reviewed and stable Respiratory status: spontaneous breathing and respiratory function stable Cardiovascular status: stable Postop Assessment: no apparent nausea or vomiting Anesthetic complications: no    Last Vitals:  Vitals:   11/09/17 1445 11/09/17 1505  BP: (!) 130/55 135/70  Pulse: (!) 108 (!) 102  Resp: 20 18  Temp: 37 C   SpO2: 95% 94%    Last Pain:  Vitals:   11/09/17 1505  TempSrc:   PainSc: 0-No pain                 Zlaty Alexa DANIEL

## 2017-11-09 NOTE — Anesthesia Procedure Notes (Signed)
Procedure Name: Intubation Performed by: Gean Maidens, CRNA Pre-anesthesia Checklist: Patient identified, Emergency Drugs available, Suction available, Patient being monitored and Timeout performed Patient Re-evaluated:Patient Re-evaluated prior to induction Oxygen Delivery Method: Circle system utilized Preoxygenation: Pre-oxygenation with 100% oxygen Induction Type: IV induction Ventilation: Mask ventilation without difficulty Laryngoscope Size: Mac and 4 Grade View: Grade II Tube type: Oral Tube size: 8.5 mm Number of attempts: 1 Airway Equipment and Method: Stylet Placement Confirmation: breath sounds checked- equal and bilateral,  ETT inserted through vocal cords under direct vision and CO2 detector Secured at: 22 cm Tube secured with: Tape Dental Injury: Teeth and Oropharynx as per pre-operative assessment

## 2017-11-09 NOTE — Transfer of Care (Signed)
Immediate Anesthesia Transfer of Care Note  Patient: Susan Holder  Procedure(s) Performed: ENDOBRONCHIAL ULTRASOUND (Bilateral ) VIDEO BRONCHOSCOPY BRONCHIAL NEEDLE ASPIRATION BIOPSIES  Patient Location: PACU  Anesthesia Type:General  Level of Consciousness: awake, alert  and oriented  Airway & Oxygen Therapy: Patient Spontanous Breathing and Patient connected to face mask oxygen  Post-op Assessment: Report given to RN and Post -op Vital signs reviewed and stable  Post vital signs: Reviewed and stable  Last Vitals:  Vitals Value Taken Time  BP    Temp    Pulse    Resp    SpO2      Last Pain:  Vitals:   11/09/17 1155  TempSrc: Oral  PainSc: 0-No pain         Complications: No apparent anesthesia complications

## 2017-11-09 NOTE — Discharge Instructions (Signed)
Flexible Bronchoscopy, Care After These instructions give you information on caring for yourself after your procedure. Your doctor may also give you more specific instructions. Call your doctor if you have any problems or questions after your procedure. Follow these instructions at home:  Do not eat or drink anything for 2 hours after your procedure. If you try to eat or drink before the medicine wears off, food or drink could go into your lungs. You could also burn yourself.  After 2 hours have passed and when you can cough and gag normally, you may eat soft food and drink liquids slowly.  The day after the test, you may eat your normal diet.  You may do your normal activities.  Keep all doctor visits. Get help right away if:  You get more and more short of breath.  You get light-headed.  You feel like you are going to pass out (faint).  You have chest pain.  You have new problems that worry you.  You cough up more than a little blood.  You cough up more blood than before. This information is not intended to replace advice given to you by your health care provider. Make sure you discuss any questions you have with your health care provider. Document Released: 02/09/2009 Document Revised: 09/20/2015 Document Reviewed: 12/17/2012 Elsevier Interactive Patient Education  2017 [employer].

## 2017-11-09 NOTE — Progress Notes (Signed)
Order placed for pt to be referred to Oncology per Dr. Vaughan Browner based on the results of pt's bronch.

## 2017-11-09 NOTE — Op Note (Signed)
[facility] Cardiop[facility] Patient Name: Susan Holder Procedure Date: 11/09/2017 MRN: [id number] Attending MD: Marshell Garfinkel , MD Date of Birth: 09-Nov-1955 CSN: [id number] Age: 62 Admit Type: Inpatient Ethnicity: Not Hispanic or Latino Procedure:            Bronchoscopy Indications:          Right hilar mass Providers:            Marshell Garfinkel, MD, Elmer Ramp. Tilden Dome, RN, William Dalton, Technician, Ashley Mariner RRT,RCP, Charolette Child, Technician Referring MD:          Medicines:            General Anesthesia Complications:        No immediate complications Estimated Blood Loss: Estimated blood loss: none. Procedure:      Pre-Anesthesia Assessment:      - A History and Physical has been performed. Patient meds and allergies       have been reviewed. The risks and benefits of the procedure and the       sedation options and risks were discussed with the patient. All       questions were answered and informed consent was obtained. Patient       identification and proposed procedure were verified prior to the       procedure by the physician in the pre-procedure area. Mental Status       Examination: alert and oriented. Airway Examination: normal       oropharyngeal airway. Respiratory Examination: clear to auscultation. CV       Examination: RRR, no murmurs, no S3 or S4. ASA Grade Assessment: II - A       patient with mild systemic disease. After reviewing the risks and       benefits, the patient was deemed in satisfactory condition to undergo       the procedure. The anesthesia plan was to use general anesthesia.       Immediately prior to administration of medications, the patient was       re-assessed for adequacy to receive sedatives. The heart rate,       respiratory rate, oxygen saturations, blood pressure, adequacy of       pulmonary ventilation, and response to care were monitored throughout       the  procedure. The physical status of the patient was re-assessed after       the procedure.      After obtaining informed consent, the bronchoscope was passed under       direct vision. Throughout the procedure, the patient's blood pressure,       pulse, and oxygen saturations were monitored continuously. the [id number]       [id number] scope was introduced through the mouth, via the endotracheal       tube (the patient was intubated for the procedure) and advanced to the       tracheobronchial tree of both lungs. the [id number] ([id number]) scope was       introduced through the and advanced to the level of bilateral lungs. Findings:      The endotracheal tube is in good position. The visualized portion of the       trachea  is of normal caliber. The carina is sharp. The tracheobronchial       tree was examined to at least the first subsegmental level.      Right Lung Abnormalities: A non-obstructing compression from extrinsic       mass was found 3-4 cm from the bifurcation (carina) in the right       mainstem bronchus and in the bronchus intermedius.      An endobronchial ultrasound endoscope was utilized in order to assist       with guiding the biopsy needle and better characterize the mass in the       right hilum.      Endobronchial biopsies of a mass were performed in the right hilum using       a a 21 gauge needle and sent for histopathology examination. 10 samples       were obtained. Impression:      - Right hilar mass      - The airway examination showed extrinsic compression of the right       trachea and bronchus intermedius      - Endobronchial ultrasound was performed.      - An endobronchial biopsy of hilar mass was performed. Moderate Sedation:      Underwent GA Recommendation:      - Await biopsy results. Procedure Code(s):      --- Professional ---      [phone], Bronchoscopy, rigid or flexible, including fluoroscopic guidance,       when performed; with bronchial or  endobronchial biopsy(s), single or       multiple sites      31654, Bronchoscopy, rigid or flexible, including fluoroscopic guidance,       when performed; with transendoscopic endobronchial ultrasound (EBUS)       during bronchoscopic diagnostic or therapeutic intervention(s) for       peripheral lesion(s) (List separately in addition to code for primary       procedure[s]) Diagnosis Code(s):      --- Professional ---      R91.8, Other nonspecific abnormal finding of lung field CPT copyright 2017 American Medical Association. All rights reserved. The codes documented in this report are preliminary and upon coder review may  be revised to meet current compliance requirements. Marshell Garfinkel, MD 11/09/2017 2:47:20 PM Number of Addenda: 0 Scope In: Scope Out:

## 2017-11-10 ENCOUNTER — Encounter (HOSPITAL_COMMUNITY): Payer: Self-pay | Admitting: Pulmonary Disease

## 2017-11-10 NOTE — Addendum Note (Signed)
Addendum  created 11/10/17 0636 by Lollie Sails, CRNA   Charge Capture section accepted

## 2017-11-11 ENCOUNTER — Telehealth: Payer: Self-pay | Admitting: *Deleted

## 2017-11-11 ENCOUNTER — Telehealth: Payer: Self-pay

## 2017-11-11 NOTE — Telephone Encounter (Signed)
Oncology Nurse Navigator Documentation  Oncology Nurse Navigator Flowsheets 11/11/2017  Navigator Location [facility]  Referral date to RadOnc/MedOnc 11/11/2017  Navigator Encounter Type Telephone/I received referral on Susan Holder today. I was unable to reach her or leave vm message.   Telephone Outgoing Call  Treatment Phase Pre-Tx/Tx Discussion  Barriers/Navigation Needs Coordination of Care  Interventions Coordination of Care  Coordination of Care Other  Acuity Level 1  Time Spent with Patient 15

## 2017-11-11 NOTE — Telephone Encounter (Signed)
-----   Message from Marshell Garfinkel, MD sent at 11/09/2017  2:55 PM EDT ----- Regarding: Referral Mrs Tapp underwent bronch today with prelim diagnosis of non small cell lung cancer. Please make a referral to Oncology

## 2017-11-11 NOTE — Telephone Encounter (Signed)
Referral to oncology was placed in 11/09/17. Nothing further is needed at this time.

## 2017-11-13 ENCOUNTER — Telehealth: Payer: Self-pay | Admitting: Pulmonary Disease

## 2017-11-13 NOTE — Telephone Encounter (Signed)
Spoke with pt. States that she is still having some issues after her bronch. Reports "spitting" up blood and a raspy voice. She had questions about her Oncology referral, advised her that it has been received and is under review (per Epic documentation).  MW - please advise on her symptoms after the bronch. Dr. Vaughan Browner is not available. Thanks.

## 2017-11-13 NOTE — Telephone Encounter (Signed)
This nl and best rx is to keep non-mint / menthol candy on hand or ice/ water sips to swallow instead of clearing the throat plus mucinex md 1200 mg bid prn otc

## 2017-11-13 NOTE — Telephone Encounter (Signed)
Called and spoke with patient, advised of MW response. Patient verbalized understanding. Nothing further needed.

## 2017-11-16 ENCOUNTER — Telehealth: Payer: Self-pay | Admitting: *Deleted

## 2017-11-16 DIAGNOSIS — R918 Other nonspecific abnormal finding of lung field: Secondary | ICD-10-CM

## 2017-11-16 NOTE — Telephone Encounter (Signed)
Oncology Nurse Navigator Documentation  Oncology Nurse Navigator Flowsheets 11/16/2017  Navigator Location [facility]  Navigator Encounter Type Telephone/I called to re-schedule Susan Holder.  She will be seen tomorrow and is aware of appt time and place.   Telephone Outgoing Call  Treatment Phase Pre-Tx/Tx Discussion  Barriers/Navigation Needs Education;Coordination of Care  Education Other  Interventions Coordination of Care;Education  Coordination of Care Appts  Education Method Verbal  Acuity Level 2  Time Spent with Patient 30

## 2017-11-17 ENCOUNTER — Inpatient Hospital Stay: Payer: BC Managed Care – PPO | Attending: Internal Medicine | Admitting: Internal Medicine

## 2017-11-17 ENCOUNTER — Encounter: Payer: Self-pay | Admitting: Internal Medicine

## 2017-11-17 ENCOUNTER — Telehealth: Payer: Self-pay | Admitting: Internal Medicine

## 2017-11-17 ENCOUNTER — Encounter: Payer: Self-pay | Admitting: *Deleted

## 2017-11-17 ENCOUNTER — Inpatient Hospital Stay: Payer: BC Managed Care – PPO

## 2017-11-17 VITALS — BP 150/79 | HR 82 | Temp 98.5°F | Resp 17 | Ht 60.0 in | Wt 128.6 lb

## 2017-11-17 DIAGNOSIS — C3431 Malignant neoplasm of lower lobe, right bronchus or lung: Secondary | ICD-10-CM | POA: Diagnosis not present

## 2017-11-17 DIAGNOSIS — C3491 Malignant neoplasm of unspecified part of right bronchus or lung: Secondary | ICD-10-CM | POA: Insufficient documentation

## 2017-11-17 DIAGNOSIS — Z7189 Other specified counseling: Secondary | ICD-10-CM

## 2017-11-17 DIAGNOSIS — Z5111 Encounter for antineoplastic chemotherapy: Secondary | ICD-10-CM

## 2017-11-17 DIAGNOSIS — C3490 Malignant neoplasm of unspecified part of unspecified bronchus or lung: Secondary | ICD-10-CM

## 2017-11-17 DIAGNOSIS — F17210 Nicotine dependence, cigarettes, uncomplicated: Secondary | ICD-10-CM

## 2017-11-17 DIAGNOSIS — F17200 Nicotine dependence, unspecified, uncomplicated: Secondary | ICD-10-CM

## 2017-11-17 DIAGNOSIS — R918 Other nonspecific abnormal finding of lung field: Secondary | ICD-10-CM

## 2017-11-17 HISTORY — DX: Encounter for antineoplastic chemotherapy: Z51.11

## 2017-11-17 HISTORY — DX: Other specified counseling: Z71.89

## 2017-11-17 HISTORY — DX: Malignant neoplasm of unspecified part of right bronchus or lung: C34.91

## 2017-11-17 LAB — CBC WITH DIFFERENTIAL (CANCER CENTER ONLY)
Basophils Absolute: 0 10*3/uL (ref 0.0–0.1)
Basophils Relative: 0 %
Eosinophils Absolute: 0.3 10*3/uL (ref 0.0–0.5)
Eosinophils Relative: 4 %
HCT: 36.9 % (ref 34.8–46.6)
Hemoglobin: 11.9 g/dL (ref 11.6–15.9)
Lymphocytes Relative: 27 %
Lymphs Abs: 2.3 10*3/uL (ref 0.9–3.3)
MCH: 30.2 pg (ref 25.1–34.0)
MCHC: 32.2 g/dL (ref 31.5–36.0)
MCV: 93.7 fL (ref 79.5–101.0)
Monocytes Absolute: 1.2 10*3/uL — ABNORMAL HIGH (ref 0.1–0.9)
Monocytes Relative: 14 %
Neutro Abs: 4.6 10*3/uL (ref 1.5–6.5)
Neutrophils Relative %: 55 %
Platelet Count: 339 10*3/uL (ref 145–400)
RBC: 3.94 MIL/uL (ref 3.70–5.45)
RDW: 14 % (ref 11.2–14.5)
WBC Count: 8.3 10*3/uL (ref 3.9–10.3)

## 2017-11-17 LAB — CMP (CANCER CENTER ONLY)
ALT: 14 U/L (ref 0–44)
AST: 21 U/L (ref 15–41)
Albumin: 3.5 g/dL (ref 3.5–5.0)
Alkaline Phosphatase: 92 U/L (ref 38–126)
Anion gap: 9 (ref 5–15)
BUN: 8 mg/dL (ref 8–23)
CO2: 26 mmol/L (ref 22–32)
Calcium: 9.7 mg/dL (ref 8.9–10.3)
Chloride: 106 mmol/L (ref 98–111)
Creatinine: 0.77 mg/dL (ref 0.44–1.00)
GFR, Est AFR Am: >60 mL/min (ref >60)
GFR, Estimated: >60 mL/min (ref >60)
Glucose, Bld: 87 mg/dL (ref 70–99)
Potassium: 3.8 mmol/L (ref 3.5–5.1)
Sodium: 141 mmol/L (ref 135–145)
Total Bilirubin: 0.3 mg/dL (ref 0.3–1.2)
Total Protein: 7.1 g/dL (ref 6.5–8.1)

## 2017-11-17 MED ORDER — PROCHLORPERAZINE MALEATE 10 MG PO TABS: 10.0000 mg | ORAL_TABLET | Freq: Four times a day (QID) | ORAL | 0 refills | Status: DC | PRN

## 2017-11-17 NOTE — Progress Notes (Signed)
START ON PATHWAY REGIMEN - Non-Small Cell Lung     Administer weekly:     Paclitaxel      Carboplatin   **Always confirm dose/schedule in your pharmacy ordering system**  Patient Characteristics: Stage III - Unresectable, PS = 0, 1 AJCC T Category: T3 Current Disease Status: No Distant Mets or Local Recurrence AJCC N Category: N1 AJCC M Category: M0 AJCC 8 Stage Grouping: IIIA Performance Status: PS = 0, 1 Intent of Therapy: Curative Intent, Discussed with Patient

## 2017-11-17 NOTE — Progress Notes (Signed)
[place] Telephone:([phone]   Fax:([phone]) [phone]  CONSULT NOTE  REFERRING PHYSICIAN: Dr. Marshell Garfinkel  REASON FOR CONSULTATION:  62 years old African-American female recently diagnosed with lung cancer.  HPI Susan Holder is a 62 y.o. female with long history of smoking and past medical history significant for nasal sinus surgery as well as tubal ligation.  The patient mentioned that she has been complaining of chest congestion and cough since the fall 2018.  She had improvement for few weeks and then in the spring 2019 she started having cough again with sinus and chest congestion.  She also had ear infection and treated with a course of antibiotics.  She presented to an urgent care center at [facility] and treated with a course of antibiotics.  Chest x-ray performed on 09/24/2017 showed new fullness in the right hilar area.  This was followed by CT scan of the chest on 09/29/2017 and it showed a pulmonary mass measuring 4.6 x 3.9 cm surrounding the proximal aspect of the right lower lobe.  There was also questionable subcarinal adenopathy measuring 2.3 x 1.6 cm.  The patient was referred to Dr. Vaughan Browner and a PET scan was performed on 10/06/2017 and it showed mass lesion within the superior segment of the right lower lobe abuts the hilar structure and partially surrounds the proximal right lower lobe bronchus.  The mass measured 4.1 x 2.9 cm and has intense metabolic activity with SUV max equal 8.1.  The mass extends into the subcarinal region.  There was no hypermetabolic mediastinal lymph nodes and no supraclavicular lymph nodes.  There was no evidence of extrathoracic metastasis. On November 09, 2017 the patient underwent video bronchoscopy with endobronchial ultrasound and biopsy of the right lower lobe and hilar mass under the care of Dr. Vaughan Browner. The final pathology [id number]) showed malignant cells consistent with non-small cell carcinoma.  The immunohistochemistry is  positive for Napsin-A and TTF-1.  There is focal weak synaptophysin and CD 56 positivity.  The overall morphology and immunohistochemistry are consistent with an adenocarcinoma.  There could be also a component of neuroendocrine differentiation. Dr. Vaughan Browner kindly referred the patient to me today for further evaluation and recommendation regarding treatment of her condition.  When seen today she continues to have some hoarseness of her voice and difficulty clearing the sputum after the bronchoscopy.  She also complaining of shortness of breath with exertion and cough productive of clear sputum.  She has some discomfort at the right rib cage from the back.  She also complains of indigestion and she is currently on proton pump inhibitor.  The patient denied having any recent weight loss or night sweats.  She has no nausea, vomiting, diarrhea or constipation.  She denied having any headache or visual changes. Family history significant for mother with stroke, coronary artery disease and Alzheimer.  Father had COPD.  First cousin had prostate cancer. The patient is divorced and has 1 daughter and 1 granddaughter.  She works as Aeronautical engineer at [employer].  She has a history for smoking 1 pack/day for close to 50 years and she quit a week ago.  She has no history of alcohol or drug abuse.  HPI  Past Medical History:  Diagnosis Date  . Rheumatoid arthritis [facility])     Past Surgical History:  Procedure Laterality Date  . BACK SURGERY    . BRONCHIAL NEEDLE ASPIRATION BIOPSY  11/09/2017   Procedure: BRONCHIAL NEEDLE ASPIRATION BIOPSIES;  Surgeon: Marshell Garfinkel, MD;  Location: [facility];  Service: Cardiopulmonary;;  . ENDOBRONCHIAL ULTRASOUND Bilateral 11/09/2017   Procedure: ENDOBRONCHIAL ULTRASOUND;  Surgeon: Marshell Garfinkel, MD;  Location: [facility];  Service: Cardiopulmonary;  Laterality: Bilateral;  . NASAL SINUS SURGERY    . NASAL SINUS SURGERY    . TUBAL LIGATION    . VIDEO  BRONCHOSCOPY  11/09/2017   Procedure: VIDEO BRONCHOSCOPY;  Surgeon: Marshell Garfinkel, MD;  Location: [facility] ENDOSCOPY;  Service: Cardiopulmonary;;    Family History  Problem Relation Age of Onset  . Stroke Mother   . Alzheimer's disease Mother   . Heart disease Mother   . Emphysema Father   . Hypertension Brother   . Heart attack Maternal Aunt   . Heart failure Maternal Grandmother   . Hypertension Paternal Grandmother     Social History Social History   Tobacco Use  . Smoking status: Former Smoker    Packs/day: 0.50    Years: 45.00    Pack years: 22.50    Types: Cigarettes    Last attempt to quit: 09/29/2017    Years since quitting: 0.1  . Smokeless tobacco: Never Used  Substance Use Topics  . Alcohol use: Not Currently    Comment: rare  . Drug use: No    No Known Allergies  Current Outpatient Medications  Medication Sig Dispense Refill  . albuterol (PROVENTIL HFA) 108 (90 Base) MCG/ACT inhaler Inhale 1 puff into the lungs every 6 (six) hours as needed for wheezing or shortness of breath.     Marland Kitchen aspirin EC 81 MG tablet Take 81 mg by mouth daily.    Marland Kitchen dextromethorphan-guaiFENesin (MUCINEX DM) 30-600 MG 12hr tablet Take 1 tablet by mouth 2 (two) times daily.    . hydroxychloroquine (PLAQUENIL) 200 MG tablet Take 200 mg po BID Monday Friday (Patient taking differently: Take 200 mg po BID Monday- Friday) 60 tablet 2  . naproxen sodium (ALEVE) 220 MG tablet Take 220 mg by mouth daily as needed (PAIN).    Marland Kitchen nicotine (NICODERM CQ - DOSED IN MG/24 HR) 7 mg/24hr patch PLACE 1 PATCH ONTO THE SKIN DAILY  0  . Probiotic Product (ALIGN PO) Take 1 capsule by mouth at bedtime.    . Vitamin D, Ergocalciferol, (DRISDOL) 50000 units CAPS capsule Take 1 capsule (50,000 Units total) by mouth every 30 (thirty) days. 3 capsule 1   No current facility-administered medications for this visit.     Review of Systems  Constitutional: positive for fatigue Eyes: negative Ears, nose, mouth, throat,  and face: negative Respiratory: positive for cough, dyspnea on exertion and pleurisy/chest pain Cardiovascular: negative Gastrointestinal: negative Genitourinary:negative Integument/breast: negative Hematologic/lymphatic: negative Musculoskeletal:negative Neurological: negative Behavioral/Psych: negative Endocrine: negative Allergic/Immunologic: negative  Physical Exam  WCH:ENIDP, healthy, no distress, well nourished, well developed and anxious SKIN: skin color, texture, turgor are normal, no rashes or significant lesions HEAD: Normocephalic, No masses, lesions, tenderness or abnormalities EYES: normal, PERRLA, Conjunctiva are pink and non-injected EARS: External ears normal, Canals clear OROPHARYNX:no exudate, no erythema and lips, buccal mucosa, and tongue normal  NECK: supple, no adenopathy, no JVD LYMPH:  no palpable lymphadenopathy, no hepatosplenomegaly BREAST:not examined LUNGS: clear to auscultation , and palpation HEART: regular rate & rhythm, no murmurs and no gallops ABDOMEN:abdomen soft, non-tender, normal bowel sounds and no masses or organomegaly BACK: Back symmetric, no curvature., No CVA tenderness EXTREMITIES:no joint deformities, effusion, or inflammation, no edema  NEURO: alert & oriented x 3 with fluent speech, no focal motor/sensory deficits  PERFORMANCE STATUS: ECOG 1  LABORATORY DATA: Lab Results  Component Value Date   WBC 8.3 11/17/2017   HGB 11.9 11/17/2017   HCT 36.9 11/17/2017   MCV 93.7 11/17/2017   PLT 339 11/17/2017      Chemistry      Component Value Date/Time   NA 138 07/24/2017 1438   K 4.8 07/24/2017 1438   CL 105 07/24/2017 1438   CO2 28 07/24/2017 1438   BUN 9 07/24/2017 1438   CREATININE 0.80 07/24/2017 1438      Component Value Date/Time   CALCIUM 9.7 07/24/2017 1438   ALKPHOS 76 11/14/2016 1200   AST 20 07/24/2017 1438   ALT 13 07/24/2017 1438   BILITOT 0.3 07/24/2017 1438       RADIOGRAPHIC STUDIES: Dg Chest Port  1 View  Result Date: 11/09/2017 CLINICAL DATA:  Post bronchoscopy.  Lung mass EXAM: PORTABLE CHEST 1 VIEW COMPARISON:  PET/CT 10/06/2017 FINDINGS: Right lower lobe airspace disease and right hilar density compatible with known mass lesion. Small right effusion. Negative for pneumothorax post biopsy. Heart size is normal.  Left lung is clear. IMPRESSION: Right hilar mass in right lower lobe airspace disease with small right effusion. Negative for pneumothorax post biopsy. Electronically Signed   By: Franchot Gallo M.D.   On: 11/09/2017 15:01    ASSESSMENT: This is a very pleasant 62 years old African-American female recently diagnosed with a stage IIIa (T3, N1/N2, M0) non-small cell lung cancer, adenocarcinoma presented with large right lower lobe lung mass with extension to the right hilum and subcarinal area diagnosed in July 2019.   PLAN: I had a lengthy discussion with the patient today about her current disease of stage, prognosis and treatment options. I personally and independently reviewed the scan images and discussed the result and showed the images to the patient today. I recommended for the patient to complete the staging work-up by ordering MRI of the brain to rule out brain metastasis. I recommended for the patient treatment with a course of concurrent chemoradiation with weekly carboplatin for AUC of 2 and paclitaxel 45 mg/M2.  This will be followed by consolidation immunotherapy with Imfinzi (Durvalumab) if the patient is not a good surgical candidate after the concurrent chemoradiation. I discussed with the patient the adverse effect of this treatment including but not limited to alopecia, myelosuppression, nausea and vomiting, peripheral neuropathy, liver or renal dysfunction. I will refer the patient to radiation oncology for evaluation and consideration of the radiotherapy. I expect the patient to start the first cycle of the concurrent chemoradiation on November 30, 2017. I will  arrange for the patient to have a chemotherapy education class before the first dose of her treatment. I will call her pharmacy with prescription for Compazine 10 mg p.o. every 6 hours as needed for nausea. She will come back for follow-up visit in 3 weeks for evaluation and management of any adverse effect of her treatment. The patient was advised to call immediately if she has any concerning symptoms in the interval. The patient voices understanding of current disease status and treatment options and is in agreement with the current care plan.  All questions were answered. The patient knows to call the clinic with any problems, questions or concerns. We can certainly see the patient much sooner if necessary.  Thank you so much for allowing me to participate in the care of Susan Holder. I will continue to follow up the patient with you and assist in her care.  I spent 55 minutes counseling the patient face to face. The total time spent in the appointment was 80 minutes.  Disclaimer: This note was dictated with voice recognition software. Similar sounding words can inadvertently be transcribed and may not be corrected upon review.   Eilleen Kempf November 17, 2017, 2:35 PM

## 2017-11-17 NOTE — Progress Notes (Signed)
Oncology Nurse Navigator Documentation  Oncology Nurse Navigator Flowsheets 11/17/2017  Navigator Location [facility]  Navigator Encounter Type Clinic/MDC/I spoke with patient today at clinic. She is newly DX lung cancer stage III.  Per Dr. Julien Nordmann, I requested cytology to go for foundation one and PDL 1 testing.  I also requested her bring in CD of her CT scan from [place]. She verbalized understanding.   Abnormal Finding Date 09/24/2017  Confirmed Diagnosis Date 11/11/2017  Patient Visit Type MedOnc  Treatment Phase Pre-Tx/Tx Discussion  Barriers/Navigation Needs Education  Education Other  Interventions Education  Education Method Verbal  Acuity Level 2  Time Spent with Patient 30

## 2017-11-17 NOTE — Telephone Encounter (Signed)
Scheduled appt per 7/23 los - gave patient AVS and calender per los.

## 2017-11-18 ENCOUNTER — Encounter: Payer: Self-pay | Admitting: Radiation Oncology

## 2017-11-18 ENCOUNTER — Ambulatory Visit: Payer: BC Managed Care – PPO | Admitting: Radiation Oncology

## 2017-11-18 ENCOUNTER — Encounter: Payer: Self-pay | Admitting: *Deleted

## 2017-11-18 DIAGNOSIS — C3491 Malignant neoplasm of unspecified part of right bronchus or lung: Secondary | ICD-10-CM

## 2017-11-18 NOTE — Progress Notes (Signed)
Oncology Nurse Navigator Documentation  Oncology Nurse Navigator Flowsheets 11/18/2017  Navigator Location [facility]  Navigator Encounter Type Other;Lobby/patient brought CD of chest from [place].  I took to radiology dept to be scanned in PACS.  I spoke to her about next steps and she is scheduled for treatment and referred to [place].  I received an update from pathology that there is not enough tissue for molecular testing with foundation one. I updated Dr. Julien Nordmann and he would like patient to get Guardant 360 and next lab draw.  I will update scheduling team.    Treatment Initiated Date 11/30/2017  Patient Visit Type MedOnc  Treatment Phase Pre-Tx/Tx Discussion  Barriers/Navigation Needs Education;Coordination of Care  Education Other  Interventions Coordination of Care;Education  Coordination of Care Other  Education Method Verbal  Acuity Level 3  Time Spent with Patient 60

## 2017-11-18 NOTE — Progress Notes (Signed)
Thoracic Location of Tumor / Histology: Non-small cell lung cancer right lung  Patient presented with symptoms of: she has been complaining of chest congestion and cough since the fall 2018.  She had improvement for few weeks and then in the spring 2019 she started having cough again with sinus and chest congestion.  She also had ear infection and treated with a course of antibiotics.  She presented to an urgent care center at [facility] and treated with a course of antibiotics.  Chest x-ray performed on 09/24/2017 showed new fullness in the right hilar area.  This was followed by CT scan of the chest on 09/29/2017 and it showed a pulmonary mass measuring 4.6 x 3.9 cm surrounding the proximal aspect of the right lower lobe.  There was also questionable subcarinal adenopathy measuring 2.3 x 1.6 cm.  The patient was referred to Dr. Vaughan Browner and a PET scan was performed on 10/06/2017 and it showed mass lesion within the superior segment of the right lower lobe abuts the hilar structure and partially surrounds the proximal right lower lobe bronchus.  The mass measured 4.1 x 2.9 cm and has intense metabolic activity with SUV max equal 8.1.  The mass extends into the subcarinal region.  There was no hypermetabolic mediastinal lymph nodes and no supraclavicular lymph nodes.  There was no evidence of extrathoracic metastasis. On November 09, 2017 the patient underwent video bronchoscopy with endobronchial ultrasound and biopsy of the right lower lobe and hilar mass under the care of Dr. Vaughan Browner. The final pathology [id number]) showed malignant cells consistent with non-small cell carcinoma.  The immunohistochemistry is positive for Napsin-A and TTF-1.  There is focal weak synaptophysin and CD 56 positivity.  The overall morphology and immunohistochemistry are consistent with an adenocarcinoma.  There could be also a component of neuroendocrine differentiation.    Biopsies  revealed: 11/09/17:  Diagnosis FINE NEEDLE  ASPIRATION, ENDOSCOPIC, EBUS, HILAR MASS(SPECIMEN 1 OF 1 COLLECTED 11/09/17): MALIGNANT CELLS CONSISTENT WITH NON-SMALL CELL CARCINOMA  Tobacco/Marijuana/Snuff/ETOH use: She has a history for smoking 1 pack/day for close to 50 years and she quit a week ago.    Past/Anticipated interventions by cardiothoracic surgery, if any: None planned at this time.  Past/Anticipated interventions by medical oncology, if any: Per Dr. Julien Nordmann 11/17/17:  I recommended for the patient treatment with a course of concurrent chemoradiation with weekly carboplatin for AUC of 2 and paclitaxel 45 mg/M2.  This will be followed by consolidation immunotherapy with Imfinzi (Durvalumab) if the patient is not a good surgical candidate after the concurrent chemoradiation. I discussed with the patient the adverse effect of this treatment including but not limited to alopecia, myelosuppression, nausea and vomiting, peripheral neuropathy, liver or renal dysfunction. I will refer the patient to radiation oncology for evaluation and consideration of the radiotherapy.    Signs/Symptoms Weight changes, if any:  Wt Readings from Last 3 Encounters:  11/19/17 129 lb 9.6 oz (58.8 kg)  11/17/17 128 lb 9.6 oz (58.3 kg)  11/09/17 128 lb (58.1 kg)     Respiratory complaints, if any: SOB with exertion, dry cough some wheezing   Hemoptysis, if any: Yes after her biopsy for around a week, none now  Pain issues, if any: No, having some throat discomfort  SAFETY ISSUES:  Prior radiation? No  Pacemaker/ICD? No  Possible current pregnancy?No  Is the patient on methotrexate? No  Current Complaints / other details:   BP (!) 160/67 (BP Location: Left Arm, Patient Position: Sitting, Cuff Size: Normal)  Pulse 91   Temp 98.4 F (36.9 C) (Oral)   Resp 20   Ht 5' (1.524 m)   Wt 129 lb 9.6 oz (58.8 kg)   LMP 04/28/2000   SpO2 99%   BMI 25.31 kg/m

## 2017-11-19 ENCOUNTER — Encounter: Payer: Self-pay | Admitting: *Deleted

## 2017-11-19 ENCOUNTER — Inpatient Hospital Stay
Admission: RE | Admit: 2017-11-19 | Discharge: 2017-11-19 | Disposition: A | Discharge status: Home / Self Care | Payer: Self-pay | Source: Ambulatory Visit | Attending: Internal Medicine | Admitting: Internal Medicine

## 2017-11-19 ENCOUNTER — Other Ambulatory Visit: Payer: Self-pay

## 2017-11-19 ENCOUNTER — Encounter: Payer: Self-pay | Admitting: Radiation Oncology

## 2017-11-19 ENCOUNTER — Other Ambulatory Visit: Payer: Self-pay | Admitting: *Deleted

## 2017-11-19 ENCOUNTER — Ambulatory Visit
Admission: RE | Admit: 2017-11-19 | Discharge: 2017-11-19 | Disposition: A | Discharge status: Home / Self Care | Payer: BC Managed Care – PPO | Source: Ambulatory Visit | Attending: Radiation Oncology | Admitting: Radiation Oncology

## 2017-11-19 ENCOUNTER — Other Ambulatory Visit: Payer: Self-pay | Admitting: Internal Medicine

## 2017-11-19 VITALS — BP 160/67 | HR 91 | Temp 98.4°F | Resp 20 | Ht 60.0 in | Wt 129.6 lb

## 2017-11-19 DIAGNOSIS — Z79899 Other long term (current) drug therapy: Secondary | ICD-10-CM | POA: Insufficient documentation

## 2017-11-19 DIAGNOSIS — C801 Malignant (primary) neoplasm, unspecified: Secondary | ICD-10-CM

## 2017-11-19 DIAGNOSIS — M069 Rheumatoid arthritis, unspecified: Secondary | ICD-10-CM | POA: Insufficient documentation

## 2017-11-19 DIAGNOSIS — Z87891 Personal history of nicotine dependence: Secondary | ICD-10-CM | POA: Diagnosis not present

## 2017-11-19 DIAGNOSIS — C3491 Malignant neoplasm of unspecified part of right bronchus or lung: Secondary | ICD-10-CM | POA: Diagnosis present

## 2017-11-19 DIAGNOSIS — C3431 Malignant neoplasm of lower lobe, right bronchus or lung: Secondary | ICD-10-CM | POA: Insufficient documentation

## 2017-11-19 DIAGNOSIS — Z7982 Long term (current) use of aspirin: Secondary | ICD-10-CM | POA: Insufficient documentation

## 2017-11-19 NOTE — Addendum Note (Signed)
Encounter addended by: Malena Edman, RN on: 11/19/2017 3:35 PM  Actions taken: Charge Capture section accepted

## 2017-11-19 NOTE — Progress Notes (Signed)
Radiation Oncology         [phone] ________________________________  Name: Susan Holder        MRN: [id number]  Date of Service: 11/19/2017 DOB: 09/02/1955  [place]:[facility], No Pcp Per  Curt Bears, MD     REFERRING PHYSICIAN: Curt Bears, MD   DIAGNOSIS: The encounter diagnosis was Adenocarcinoma of right lung, stage 3 ([place]).   HISTORY OF PRESENT ILLNESS: Susan Holder is a 62 y.o. female seen at the request of Dr. Julien Nordmann for a newly diagnosed right lung cancer. The patient had symptoms of cough and congestion and was seen in an urgent care setting at [facility] and completed a course of antibiotic therapy. Her symptoms did not improve and a CXR on 09/24/17 revealed fullness in the right hilum. Sh had a CT chest on 09/29/17 that revealed a 4.6 x 3.9 cm mass in the right lower lobe and subcarinal adenopathy measuring 2.3 x 1.6 cm was noted. A PET scan on 10/06/17 revealed this mass in the RLL abutting the hilum and the SUV measured 8.1. There was no hypermetabolic mediastinal disease, and no supraclavicular adenopathy was noted. A bronchoscopy with EBUS on 11/09/17 revealed malignant cells consistent with NSCLC, and this was consistent with adenocarcinoma. there could also be neuroendocrine differentiation. She is scheduled for MRI of the brain on Monday 11/23/17. She has met with Dr. Julien Nordmann and has plans to begin chemotherapy on 11/30/17, and comes today to discuss options of radiotherapy.     PREVIOUS RADIATION THERAPY: No   PAST MEDICAL HISTORY:  Past Medical History:  Diagnosis Date  . Rheumatoid arthritis ([place])        PAST SURGICAL HISTORY: Past Surgical History:  Procedure Laterality Date  . BACK SURGERY    . BRONCHIAL NEEDLE ASPIRATION BIOPSY  11/09/2017   Procedure: BRONCHIAL NEEDLE ASPIRATION BIOPSIES;  Surgeon: Marshell Garfinkel, MD;  Location: [facility];  Service: Cardiopulmonary;;  . ENDOBRONCHIAL ULTRASOUND Bilateral 11/09/2017   Procedure: ENDOBRONCHIAL  ULTRASOUND;  Surgeon: Marshell Garfinkel, MD;  Location: [facility];  Service: Cardiopulmonary;  Laterality: Bilateral;  . NASAL SINUS SURGERY    . NASAL SINUS SURGERY    . TUBAL LIGATION    . VIDEO BRONCHOSCOPY  11/09/2017   Procedure: VIDEO BRONCHOSCOPY;  Surgeon: Marshell Garfinkel, MD;  Location: [facility];  Service: Cardiopulmonary;;     FAMILY HISTORY:  Family History  Problem Relation Age of Onset  . Stroke Mother   . Alzheimer's disease Mother   . Heart disease Mother   . Emphysema Father   . Hypertension Brother   . Heart attack Maternal Aunt   . Heart failure Maternal Grandmother   . Hypertension Paternal Grandmother      SOCIAL HISTORY:  reports that she quit smoking about 7 weeks ago. Her smoking use included cigarettes. She has a 22.50 pack-year smoking history. She has never used smokeless tobacco. She reports that she drank alcohol. She reports that she does not use drugs. The patient is divorced and lives in [place]. 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   ALLERGIES: Patient has no known allergies.   MEDICATIONS:  Current Outpatient Medications  Medication Sig Dispense Refill  . aspirin EC 81 MG tablet Take 81 mg by mouth daily.    . famotidine (PEPCID) 40 MG tablet Take 40 mg by mouth daily.    . hydroxychloroquine (PLAQUENIL) 200 MG tablet Take 200 mg po BID Monday Friday (Patient taking differently: Take 200 mg po BID Monday- Friday) 60 tablet 2  .  naproxen sodium (ALEVE) 220 MG tablet Take 220 mg by mouth daily as needed (PAIN).    . Probiotic Product (ALIGN PO) Take 1 capsule by mouth at bedtime.    . pseudoephedrine-guaifenesin (MUCINEX D) 60-600 MG 12 hr tablet Take 1 tablet by mouth every 12 (twelve) hours.    Marland Kitchen albuterol (PROVENTIL HFA) 108 (90 Base) MCG/ACT inhaler Inhale 1 puff into the lungs every 6 (six) hours as needed for wheezing or shortness of breath.     . prochlorperazine (COMPAZINE) 10 MG tablet Take 1 tablet (10 mg total) by mouth  every 6 (six) hours as needed for nausea or vomiting. (Patient not taking: Reported on 11/19/2017) 30 tablet 0  . Vitamin D, Ergocalciferol, (DRISDOL) 50000 units CAPS capsule Take 1 capsule (50,000 Units total) by mouth every 30 (thirty) days. (Patient not taking: Reported on 11/19/2017) 3 capsule 1   No current facility-administered medications for this encounter.      REVIEW OF SYSTEMS: On review of systems, the patient reports that she is doing well overall. She denies any chest pain, shortness of breath, fevers, chills, night sweats, unintended weight changes. She has had a dry but occasionally productive cough. She denies any bowel or bladder disturbances, and denies abdominal pain, nausea or vomiting. She denies any new musculoskeletal or joint aches or pains. A complete review of systems is obtained and is otherwise negative.     PHYSICAL EXAM:  Wt Readings from Last 3 Encounters:  11/19/17 129 lb 9.6 oz (58.8 kg)  11/17/17 128 lb 9.6 oz (58.3 kg)  11/09/17 128 lb (58.1 kg)   Temp Readings from Last 3 Encounters:  11/19/17 98.4 F (36.9 C) (Oral)  11/17/17 98.5 F (36.9 C) (Oral)  11/09/17 98.6 F (37 C) (Oral)   BP Readings from Last 3 Encounters:  11/19/17 (!) 160/67  11/17/17 (!) 150/79  11/09/17 135/69   Pulse Readings from Last 3 Encounters:  11/19/17 91  11/17/17 82  11/09/17 100   Pain Assessment Pain Score: 0-No pain/10  In general this is a well appearing African American female in no acute distress. She is alert and oriented x4 and appropriate throughout the examination. HEENT reveals that the patient is normocephalic, atraumatic. EOMs are intact. PERRLA. Skin is intact without any evidence of gross lesions. Cardiovascular exam reveals a regular rate and rhythm, no clicks rubs or murmurs are auscultated. Chest is clear to auscultation bilaterally. Lymphatic assessment is performed and does not reveal any adenopathy in the cervical, supraclavicular chains. Lower  extremities are negative for pretibial pitting edema, deep calf tenderness, cyanosis or clubbing.   ECOG = 0  0 - Asymptomatic (Fully active, able to carry on all predisease activities without restriction)  1 - Symptomatic but completely ambulatory (Restricted in physically strenuous activity but ambulatory and able to carry out work of a light or sedentary nature. For example, light housework, office work)  2 - Symptomatic, <50% in bed during the day (Ambulatory and capable of all self care but unable to carry out any work activities. Up and about more than 50% of waking hours)  3 - Symptomatic, >50% in bed, but not bedbound (Capable of only limited self-care, confined to bed or chair 50% or more of waking hours)  4 - Bedbound (Completely disabled. Cannot carry on any self-care. Totally confined to bed or chair)  5 - Death   Eustace Pen MM, Creech RH, Tormey DC, et al. [phone]). "Toxicity and response criteria of the [facility]". Am.  J. Clin. Oncol. 5 (6): 649-55    LABORATORY DATA:  Lab Results  Component Value Date   WBC 8.3 11/17/2017   HGB 11.9 11/17/2017   HCT 36.9 11/17/2017   MCV 93.7 11/17/2017   PLT 339 11/17/2017   Lab Results  Component Value Date   NA 141 11/17/2017   K 3.8 11/17/2017   CL 106 11/17/2017   CO2 26 11/17/2017   Lab Results  Component Value Date   ALT 14 11/17/2017   AST 21 11/17/2017   ALKPHOS 92 11/17/2017   BILITOT 0.3 11/17/2017      RADIOGRAPHY: Dg Chest Port 1 View  Result Date: 11/09/2017 CLINICAL DATA:  Post bronchoscopy.  Lung mass EXAM: PORTABLE CHEST 1 VIEW COMPARISON:  PET/CT 10/06/2017 FINDINGS: Right lower lobe airspace disease and right hilar density compatible with known mass lesion. Small right effusion. Negative for pneumothorax post biopsy. Heart size is normal.  Left lung is clear. IMPRESSION: Right hilar mass in right lower lobe airspace disease with small right effusion. Negative for pneumothorax post  biopsy. Electronically Signed   By: Franchot Gallo M.D.   On: 11/09/2017 15:01       IMPRESSION/PLAN: 1. Stage IIIA, cT3N1-2M0, NSCLC, adenocarcinoma of the RLL. Dr. Lisbeth Renshaw discusses the pathology findings and reviews the nature of locally advanced lung cancer, and we discussed the rationale for concurrent chemoRT. We discussed the risks, benefits, short, and long term effects of radiotherapy, and the patient is interested in proceeding. Dr. Lisbeth Renshaw discusses the delivery and logistics of radiotherapy and anticipates a course of 6 1/2 weeks of radiotherapy. Written consent is obtained and placed in the chart, a copy was provided to the patient. She will simulate today and begin treatment on 11/30/17.  In a visit lasting 45 minutes, greater than 50% of the time was spent face to face discussing her case, and coordinating the patient's care.  The above documentation reflects my direct findings during this shared patient visit. Please see the separate note by Dr. Lisbeth Renshaw on this date for the remainder of the patient's plan of care.    Carola Rhine, PAC

## 2017-11-19 NOTE — Addendum Note (Signed)
Encounter addended by: Malena Edman, RN on: 11/19/2017 3:36 PM  Actions taken: Charge Capture section accepted

## 2017-11-19 NOTE — Progress Notes (Signed)
Per [facility] Dr. Tresa Moore states there is enough tissue to send foundation one and PDL 1.  According to pathology, this was sent yesterday.

## 2017-11-20 ENCOUNTER — Inpatient Hospital Stay: Payer: BC Managed Care – PPO

## 2017-11-23 ENCOUNTER — Ambulatory Visit (HOSPITAL_COMMUNITY)
Admission: RE | Admit: 2017-11-23 | Discharge: 2017-11-23 | Disposition: A | Discharge status: Home / Self Care | Payer: BC Managed Care – PPO | Source: Ambulatory Visit | Attending: Internal Medicine | Admitting: Internal Medicine

## 2017-11-23 DIAGNOSIS — C3490 Malignant neoplasm of unspecified part of unspecified bronchus or lung: Secondary | ICD-10-CM

## 2017-11-23 MED ORDER — GADOBENATE DIMEGLUMINE 529 MG/ML IV SOLN
10.0000 mL | Freq: Once | INTRAVENOUS | Status: AC | PRN
  Administered 2017-11-23: 10 mL via INTRAVENOUS

## 2017-11-24 ENCOUNTER — Encounter (HOSPITAL_COMMUNITY): Payer: Self-pay | Admitting: Internal Medicine

## 2017-11-25 ENCOUNTER — Telehealth: Payer: Self-pay | Admitting: *Deleted

## 2017-11-25 NOTE — Telephone Encounter (Signed)
Pt called asking for MRI results. Reviewed with MD, gave pt MRI results. Per MD progress note pt to follow up 8/12. Pt does not need to see a provider on 8/5 prior to infusion appt. Discussed with pt

## 2017-11-27 ENCOUNTER — Telehealth: Payer: Self-pay | Admitting: Medical Oncology

## 2017-11-27 DIAGNOSIS — C3431 Malignant neoplasm of lower lobe, right bronchus or lung: Secondary | ICD-10-CM | POA: Insufficient documentation

## 2017-11-27 DIAGNOSIS — Z510 Encounter for antineoplastic radiation therapy: Secondary | ICD-10-CM | POA: Diagnosis not present

## 2017-11-27 NOTE — Telephone Encounter (Signed)
Pt stated nurse told her she needs to see Julien Nordmann on Monday re lab results . I told her  I will consult with [facility] and he may see her in the infusion room.

## 2017-11-28 NOTE — Telephone Encounter (Signed)
I do not see the [facility] result. It is still pending. I will see her once the results are available.

## 2017-11-30 ENCOUNTER — Inpatient Hospital Stay: Payer: BC Managed Care – PPO | Admitting: Medical

## 2017-11-30 ENCOUNTER — Encounter: Payer: Self-pay | Admitting: *Deleted

## 2017-11-30 ENCOUNTER — Inpatient Hospital Stay: Payer: BC Managed Care – PPO | Attending: Internal Medicine

## 2017-11-30 ENCOUNTER — Ambulatory Visit
Admission: RE | Admit: 2017-11-30 | Discharge: 2017-11-30 | Disposition: A | Discharge status: Home / Self Care | Payer: BC Managed Care – PPO | Source: Ambulatory Visit | Attending: Radiation Oncology | Admitting: Radiation Oncology

## 2017-11-30 ENCOUNTER — Inpatient Hospital Stay: Payer: BC Managed Care – PPO

## 2017-11-30 VITALS — BP 139/79 | HR 69 | Temp 98.8°F | Resp 16 | Ht 60.0 in | Wt 128.8 lb

## 2017-11-30 DIAGNOSIS — C3491 Malignant neoplasm of unspecified part of right bronchus or lung: Secondary | ICD-10-CM

## 2017-11-30 DIAGNOSIS — C3431 Malignant neoplasm of lower lobe, right bronchus or lung: Secondary | ICD-10-CM | POA: Diagnosis not present

## 2017-11-30 DIAGNOSIS — R1310 Dysphagia, unspecified: Secondary | ICD-10-CM | POA: Insufficient documentation

## 2017-11-30 DIAGNOSIS — Z5111 Encounter for antineoplastic chemotherapy: Secondary | ICD-10-CM | POA: Diagnosis present

## 2017-11-30 DIAGNOSIS — T80818A Extravasation of other vesicant agent, initial encounter: Secondary | ICD-10-CM

## 2017-11-30 DIAGNOSIS — M069 Rheumatoid arthritis, unspecified: Secondary | ICD-10-CM | POA: Insufficient documentation

## 2017-11-30 LAB — CMP (CANCER CENTER ONLY)
ALT: 14 U/L (ref 0–44)
AST: 21 U/L (ref 15–41)
Albumin: 3.7 g/dL (ref 3.5–5.0)
Alkaline Phosphatase: 100 U/L (ref 38–126)
Anion gap: 12 (ref 5–15)
BUN: 11 mg/dL (ref 8–23)
CO2: 19 mmol/L — ABNORMAL LOW (ref 22–32)
Calcium: 9.6 mg/dL (ref 8.9–10.3)
Chloride: 108 mmol/L (ref 98–111)
Creatinine: 0.81 mg/dL (ref 0.44–1.00)
GFR, Est AFR Am: >60 mL/min (ref >60)
GFR, Estimated: >60 mL/min (ref >60)
Glucose, Bld: 100 mg/dL — ABNORMAL HIGH (ref 70–99)
Potassium: 4 mmol/L (ref 3.5–5.1)
Sodium: 139 mmol/L (ref 135–145)
Total Bilirubin: 0.3 mg/dL (ref 0.3–1.2)
Total Protein: 7.6 g/dL (ref 6.5–8.1)

## 2017-11-30 LAB — CBC WITH DIFFERENTIAL (CANCER CENTER ONLY)
Basophils Absolute: 0 10*3/uL (ref 0.0–0.1)
Basophils Relative: 1 %
Eosinophils Absolute: 0.2 10*3/uL (ref 0.0–0.5)
Eosinophils Relative: 3 %
HCT: 39.6 % (ref 34.8–46.6)
Hemoglobin: 13 g/dL (ref 11.6–15.9)
Lymphocytes Relative: 24 %
Lymphs Abs: 2 10*3/uL (ref 0.9–3.3)
MCH: 30 pg (ref 25.1–34.0)
MCHC: 32.7 g/dL (ref 31.5–36.0)
MCV: 91.7 fL (ref 79.5–101.0)
Monocytes Absolute: 0.9 10*3/uL (ref 0.1–0.9)
Monocytes Relative: 10 %
Neutro Abs: 5.3 10*3/uL (ref 1.5–6.5)
Neutrophils Relative %: 62 %
Platelet Count: 360 10*3/uL (ref 145–400)
RBC: 4.32 MIL/uL (ref 3.70–5.45)
RDW: 14.6 % — ABNORMAL HIGH (ref 11.2–14.5)
WBC Count: 8.5 10*3/uL (ref 3.9–10.3)

## 2017-11-30 MED ORDER — PALONOSETRON HCL INJECTION 0.25 MG/5ML
INTRAVENOUS | Status: AC
  Filled 2017-11-30: qty 5

## 2017-11-30 MED ORDER — DIPHENHYDRAMINE HCL 50 MG/ML IJ SOLN
50.0000 mg | Freq: Once | INTRAMUSCULAR | Status: AC
  Administered 2017-11-30: 50 mg via INTRAVENOUS

## 2017-11-30 MED ORDER — SODIUM CHLORIDE 0.9 % IV SOLN
20.0000 mg | Freq: Once | INTRAVENOUS | Status: AC
  Administered 2017-11-30: 20 mg via INTRAVENOUS
  Filled 2017-11-30: qty 2

## 2017-11-30 MED ORDER — SODIUM CHLORIDE 0.9 % IV SOLN
180.0000 mg | Freq: Once | INTRAVENOUS | Status: AC
  Administered 2017-11-30: 180 mg via INTRAVENOUS
  Filled 2017-11-30: qty 18

## 2017-11-30 MED ORDER — FAMOTIDINE IN NACL 20-0.9 MG/50ML-% IV SOLN
INTRAVENOUS | Status: AC
  Filled 2017-11-30: qty 50

## 2017-11-30 MED ORDER — FAMOTIDINE IN NACL 20-0.9 MG/50ML-% IV SOLN
20.0000 mg | Freq: Once | INTRAVENOUS | Status: AC
  Administered 2017-11-30: 20 mg via INTRAVENOUS

## 2017-11-30 MED ORDER — SODIUM CHLORIDE 0.9 % IV SOLN
45.0000 mg/m2 | Freq: Once | INTRAVENOUS | Status: AC
  Administered 2017-11-30: 72 mg via INTRAVENOUS
  Filled 2017-11-30: qty 12

## 2017-11-30 MED ORDER — COLD PACK MISC ONCOLOGY
1.0000 | Freq: Once | Status: DC | PRN
  Filled 2017-11-30: qty 1

## 2017-11-30 MED ORDER — PALONOSETRON HCL INJECTION 0.25 MG/5ML
0.2500 mg | Freq: Once | INTRAVENOUS | Status: AC
  Administered 2017-11-30: 0.25 mg via INTRAVENOUS

## 2017-11-30 MED ORDER — DIPHENHYDRAMINE HCL 50 MG/ML IJ SOLN
INTRAMUSCULAR | Status: AC
Start: 2017-11-30 — End: ?
  Filled 2017-11-30: qty 1

## 2017-11-30 MED ORDER — SODIUM CHLORIDE 0.9 % IV SOLN
Freq: Once | INTRAVENOUS | Status: AC
  Administered 2017-11-30: 10:00:00 via INTRAVENOUS
  Filled 2017-11-30: qty 250

## 2017-11-30 NOTE — Progress Notes (Signed)
Oncology Nurse Navigator Documentation  Oncology Nurse Navigator Flowsheets 11/30/2017  Navigator Location [facility]  Navigator Encounter Type Other/per Dr. Manuella Ghazi 360 cancelled.  I notified lab and will follow up with Dr. Julien Nordmann on how to cancel in EMR.   Treatment Phase Pre-Tx/Tx Discussion  Barriers/Navigation Needs Coordination of Care  Interventions Coordination of Care  Coordination of Care Other  Acuity Level 2  Time Spent with Patient 30

## 2017-11-30 NOTE — Progress Notes (Signed)
At 1120 patient verbalized "Nurse, my IV" upon assessment, RN noted IV to be infiltrated. Infusion stopped. Cold compress applied. IV removed. Dry dressing applied. Extremity elevated. Cool compress reapplied. Patient denies any discomfort. PA, [name] to infusion area to assess site. Education provided. Patient verbalized understanding.

## 2017-11-30 NOTE — Patient Instructions (Signed)
[facility] Discharge Instructions for Patients Receiving Chemotherapy  Today you received the following chemotherapy agents Paclitaxel, Carboplatin  To help prevent nausea and vomiting after your treatment, we encourage you to take your nausea medication as directed   If you develop nausea and vomiting that is not controlled by your nausea medication, call the clinic.   BELOW ARE SYMPTOMS THAT SHOULD BE REPORTED IMMEDIATELY:  *FEVER GREATER THAN 100.5 F  *CHILLS WITH OR WITHOUT FEVER  NAUSEA AND VOMITING THAT IS NOT CONTROLLED WITH YOUR NAUSEA MEDICATION  *UNUSUAL SHORTNESS OF BREATH  *UNUSUAL BRUISING OR BLEEDING  TENDERNESS IN MOUTH AND THROAT WITH OR WITHOUT PRESENCE OF ULCERS  *URINARY PROBLEMS  *BOWEL PROBLEMS  UNUSUAL RASH Items with * indicate a potential emergency and should be followed up as soon as possible.  Feel free to call the clinic should you have any questions or concerns. The clinic phone number is [phone].  Please show the [facility] at check-in to the Emergency Department and triage nurse.   Paclitaxel injection What is this medicine? PACLITAXEL (PAK li TAX el) is a chemotherapy drug. It targets fast dividing cells, like cancer cells, and causes these cells to die. This medicine is used to treat ovarian cancer, breast cancer, and other cancers. This medicine may be used for other purposes; ask your health care provider or pharmacist if you have questions. COMMON BRAND NAME(S): Onxol, Taxol What should I tell my health care provider before I take this medicine? They need to know if you have any of these conditions: -blood disorders -irregular heartbeat -infection (especially a virus infection such as chickenpox, cold sores, or herpes) -liver disease -previous or ongoing radiation therapy -an unusual or allergic reaction to paclitaxel, alcohol, polyoxyethylated castor oil, other chemotherapy agents, other medicines, foods,  dyes, or preservatives -pregnant or trying to get pregnant -breast-feeding How should I use this medicine? This drug is given as an infusion into a vein. It is administered in a hospital or clinic by a specially trained health care professional. Talk to your pediatrician regarding the use of this medicine in children. Special care may be needed. Overdosage: If you think you have taken too much of this medicine contact a poison control center or emergency room at once. NOTE: This medicine is only for you. Do not share this medicine with others. What if I miss a dose? It is important not to miss your dose. Call your doctor or health care professional if you are unable to keep an appointment. What may interact with this medicine? Do not take this medicine with any of the following medications: -disulfiram -metronidazole This medicine may also interact with the following medications: -cyclosporine -diazepam -ketoconazole -medicines to increase blood counts like filgrastim, pegfilgrastim, sargramostim -other chemotherapy drugs like cisplatin, doxorubicin, epirubicin, etoposide, teniposide, vincristine -quinidine -testosterone -vaccines -verapamil Talk to your doctor or health care professional before taking any of these medicines: -acetaminophen -aspirin -ibuprofen -ketoprofen -naproxen This list may not describe all possible interactions. Give your health care provider a list of all the medicines, herbs, non-prescription drugs, or dietary supplements you use. Also tell them if you smoke, drink alcohol, or use illegal drugs. Some items may interact with your medicine. What should I watch for while using this medicine? Your condition will be monitored carefully while you are receiving this medicine. You will need important blood work done while you are taking this medicine. This medicine can cause serious allergic reactions. To reduce your risk you will need  to take other medicine(s)  before treatment with this medicine. If you experience allergic reactions like skin rash, itching or hives, swelling of the face, lips, or tongue, tell your doctor or health care professional right away. In some cases, you may be given additional medicines to help with side effects. Follow all directions for their use. This drug may make you feel generally unwell. This is not uncommon, as chemotherapy can affect healthy cells as well as cancer cells. Report any side effects. Continue your course of treatment even though you feel ill unless your doctor tells you to stop. Call your doctor or health care professional for advice if you get a fever, chills or sore throat, or other symptoms of a cold or flu. Do not treat yourself. This drug decreases your body's ability to fight infections. Try to avoid being around people who are sick. This medicine may increase your risk to bruise or bleed. Call your doctor or health care professional if you notice any unusual bleeding. Be careful brushing and flossing your teeth or using a toothpick because you may get an infection or bleed more easily. If you have any dental work done, tell your dentist you are receiving this medicine. Avoid taking products that contain aspirin, acetaminophen, ibuprofen, naproxen, or ketoprofen unless instructed by your doctor. These medicines may hide a fever. Do not become pregnant while taking this medicine. Women should inform their doctor if they wish to become pregnant or think they might be pregnant. There is a potential for serious side effects to an unborn child. Talk to your health care professional or pharmacist for more information. Do not breast-feed an infant while taking this medicine. Men are advised not to father a child while receiving this medicine. This product may contain alcohol. Ask your pharmacist or healthcare provider if this medicine contains alcohol. Be sure to tell all healthcare providers you are taking this  medicine. Certain medicines, like metronidazole and disulfiram, can cause an unpleasant reaction when taken with alcohol. The reaction includes flushing, headache, nausea, vomiting, sweating, and increased thirst. The reaction can last from 30 minutes to several hours. What side effects may I notice from receiving this medicine? Side effects that you should report to your doctor or health care professional as soon as possible: -allergic reactions like skin rash, itching or hives, swelling of the face, lips, or tongue -low blood counts - This drug may decrease the number of white blood cells, red blood cells and platelets. You may be at increased risk for infections and bleeding. -signs of infection - fever or chills, cough, sore throat, pain or difficulty passing urine -signs of decreased platelets or bleeding - bruising, pinpoint red spots on the skin, black, tarry stools, nosebleeds -signs of decreased red blood cells - unusually weak or tired, fainting spells, lightheadedness -breathing problems -chest pain -high or low blood pressure -mouth sores -nausea and vomiting -pain, swelling, redness or irritation at the injection site -pain, tingling, numbness in the hands or feet -slow or irregular heartbeat -swelling of the ankle, feet, hands Side effects that usually do not require medical attention (report to your doctor or health care professional if they continue or are bothersome): -bone pain -complete hair loss including hair on your head, underarms, pubic hair, eyebrows, and eyelashes -changes in the color of fingernails -diarrhea -loosening of the fingernails -loss of appetite -muscle or joint pain -red flush to skin -sweating This list may not describe all possible side effects. Call your doctor for medical advice  about side effects. You may report side effects to FDA at 1-800-FDA-1088. Where should I keep my medicine? This drug is given in a hospital or clinic and will not be  stored at home. NOTE: This sheet is a summary. It may not cover all possible information. If you have questions about this medicine, talk to your doctor, pharmacist, or health care provider.  2018 Elsevier/Gold Standard (2015-02-13 19:58:00)  Carboplatin injection What is this medicine? CARBOPLATIN (KAR boe pla tin) is a chemotherapy drug. It targets fast dividing cells, like cancer cells, and causes these cells to die. This medicine is used to treat ovarian cancer and many other cancers. This medicine may be used for other purposes; ask your health care provider or pharmacist if you have questions. COMMON BRAND NAME(S): Paraplatin What should I tell my health care provider before I take this medicine? They need to know if you have any of these conditions: -blood disorders -hearing problems -kidney disease -recent or ongoing radiation therapy -an unusual or allergic reaction to carboplatin, cisplatin, other chemotherapy, other medicines, foods, dyes, or preservatives -pregnant or trying to get pregnant -breast-feeding How should I use this medicine? This drug is usually given as an infusion into a vein. It is administered in a hospital or clinic by a specially trained health care professional. Talk to your pediatrician regarding the use of this medicine in children. Special care may be needed. Overdosage: If you think you have taken too much of this medicine contact a poison control center or emergency room at once. NOTE: This medicine is only for you. Do not share this medicine with others. What if I miss a dose? It is important not to miss a dose. Call your doctor or health care professional if you are unable to keep an appointment. What may interact with this medicine? -medicines for seizures -medicines to increase blood counts like filgrastim, pegfilgrastim, sargramostim -some antibiotics like amikacin, gentamicin, neomycin, streptomycin, tobramycin -vaccines Talk to your doctor or  health care professional before taking any of these medicines: -acetaminophen -aspirin -ibuprofen -ketoprofen -naproxen This list may not describe all possible interactions. Give your health care provider a list of all the medicines, herbs, non-prescription drugs, or dietary supplements you use. Also tell them if you smoke, drink alcohol, or use illegal drugs. Some items may interact with your medicine. What should I watch for while using this medicine? Your condition will be monitored carefully while you are receiving this medicine. You will need important blood work done while you are taking this medicine. This drug may make you feel generally unwell. This is not uncommon, as chemotherapy can affect healthy cells as well as cancer cells. Report any side effects. Continue your course of treatment even though you feel ill unless your doctor tells you to stop. In some cases, you may be given additional medicines to help with side effects. Follow all directions for their use. Call your doctor or health care professional for advice if you get a fever, chills or sore throat, or other symptoms of a cold or flu. Do not treat yourself. This drug decreases your body's ability to fight infections. Try to avoid being around people who are sick. This medicine may increase your risk to bruise or bleed. Call your doctor or health care professional if you notice any unusual bleeding. Be careful brushing and flossing your teeth or using a toothpick because you may get an infection or bleed more easily. If you have any dental work done, tell your dentist  you are receiving this medicine. Avoid taking products that contain aspirin, acetaminophen, ibuprofen, naproxen, or ketoprofen unless instructed by your doctor. These medicines may hide a fever. Do not become pregnant while taking this medicine. Women should inform their doctor if they wish to become pregnant or think they might be pregnant. There is a potential for  serious side effects to an unborn child. Talk to your health care professional or pharmacist for more information. Do not breast-feed an infant while taking this medicine. What side effects may I notice from receiving this medicine? Side effects that you should report to your doctor or health care professional as soon as possible: -allergic reactions like skin rash, itching or hives, swelling of the face, lips, or tongue -signs of infection - fever or chills, cough, sore throat, pain or difficulty passing urine -signs of decreased platelets or bleeding - bruising, pinpoint red spots on the skin, black, tarry stools, nosebleeds -signs of decreased red blood cells - unusually weak or tired, fainting spells, lightheadedness -breathing problems -changes in hearing -changes in vision -chest pain -high blood pressure -low blood counts - This drug may decrease the number of white blood cells, red blood cells and platelets. You may be at increased risk for infections and bleeding. -nausea and vomiting -pain, swelling, redness or irritation at the injection site -pain, tingling, numbness in the hands or feet -problems with balance, talking, walking -trouble passing urine or change in the amount of urine Side effects that usually do not require medical attention (report to your doctor or health care professional if they continue or are bothersome): -hair loss -loss of appetite -metallic taste in the mouth or changes in taste This list may not describe all possible side effects. Call your doctor for medical advice about side effects. You may report side effects to FDA at 1-800-FDA-1088. Where should I keep my medicine? This drug is given in a hospital or clinic and will not be stored at home. NOTE: This sheet is a summary. It may not cover all possible information. If you have questions about this medicine, talk to your doctor, pharmacist, or health care provider.  2018 Elsevier/Gold Standard  (2007-07-20 14:38:05)

## 2017-11-30 NOTE — Progress Notes (Signed)
Discharge instructions printed and verbally reviewed. Patient verbalized understanding.

## 2017-12-01 ENCOUNTER — Ambulatory Visit
Admission: RE | Admit: 2017-12-01 | Discharge: 2017-12-01 | Disposition: A | Discharge status: Home / Self Care | Payer: BC Managed Care – PPO | Source: Ambulatory Visit | Attending: Radiation Oncology | Admitting: Radiation Oncology

## 2017-12-01 ENCOUNTER — Ambulatory Visit: Payer: BC Managed Care – PPO | Admitting: Medical

## 2017-12-01 DIAGNOSIS — C3431 Malignant neoplasm of lower lobe, right bronchus or lung: Secondary | ICD-10-CM | POA: Diagnosis not present

## 2017-12-02 ENCOUNTER — Inpatient Hospital Stay: Payer: BC Managed Care – PPO | Admitting: Medical

## 2017-12-02 ENCOUNTER — Ambulatory Visit
Admission: RE | Admit: 2017-12-02 | Discharge: 2017-12-02 | Disposition: A | Discharge status: Home / Self Care | Payer: BC Managed Care – PPO | Source: Ambulatory Visit | Attending: Radiation Oncology | Admitting: Radiation Oncology

## 2017-12-02 ENCOUNTER — Telehealth: Payer: Self-pay | Admitting: Medical

## 2017-12-02 ENCOUNTER — Telehealth: Payer: Self-pay | Admitting: Medical Oncology

## 2017-12-02 DIAGNOSIS — T80818D Extravasation of other vesicant agent, subsequent encounter: Secondary | ICD-10-CM

## 2017-12-02 DIAGNOSIS — C3431 Malignant neoplasm of lower lobe, right bronchus or lung: Secondary | ICD-10-CM | POA: Diagnosis not present

## 2017-12-02 NOTE — Progress Notes (Signed)
   DATE: 08/05/20192      IV EXTRAVASATION (IRRITANT)  MD: Dr. Fanny Holder. Susan Holder   AGENT RECEIVED AT TIME OF EXTRAVASATION:   Paclitaxel  IV SITE LOCATION: Distal right dorsal forearm  INTERVENTION:  1) IV stopped  2) 0 ml liquid/blood aspirated from IV site.  3) Patient Teaching Instruction Sheet reviewed with Susan Holder.  A) Apply cold pack to area for 15 to 20 minutes 4 times daily for the next 48 hours B) Elevate the affected site for the next 48 hours. C) Coban dressing applied to area. Patient instructed to protect the area from sunlight. D) Return for evaluation in 24 hours, 48 hours, and 7 days. E) Susan Holder was instructed to call [phone] if she has questions or notes acute changes to the area.  Review of Systems  Skin:       Mild swelling proximal to the IV site in the distal, dorsal right forearm.  No pain reported.    Physical Exam  Constitutional: No distress.  HENT:  Head: Normocephalic and atraumatic.  Neurological: She is alert.  Skin: Skin is warm and dry. She is not diaphoretic.  Mild induration proximal to the IV site noted in the distal, dorsal right forearm.  Psychiatric: She has a normal mood and affect. Her behavior is normal. Judgment and thought content normal.       Susan Holder, MHS, PA-C

## 2017-12-02 NOTE — Telephone Encounter (Signed)
C-

## 2017-12-02 NOTE — Telephone Encounter (Signed)
Pt in [facility] today

## 2017-12-03 ENCOUNTER — Telehealth: Payer: Self-pay | Admitting: Medical Oncology

## 2017-12-03 ENCOUNTER — Ambulatory Visit
Admission: RE | Admit: 2017-12-03 | Discharge: 2017-12-03 | Disposition: A | Discharge status: Home / Self Care | Payer: BC Managed Care – PPO | Source: Ambulatory Visit | Attending: Radiation Oncology | Admitting: Radiation Oncology

## 2017-12-03 DIAGNOSIS — C3431 Malignant neoplasm of lower lobe, right bronchus or lung: Secondary | ICD-10-CM | POA: Diagnosis not present

## 2017-12-03 NOTE — Telephone Encounter (Signed)
Reports acid reflux. She is taking Pepcid . I intsructed her to take TUMS prn. SHe stated her arm looks better and she will come in Monday to have it evaluated again.,

## 2017-12-03 NOTE — Progress Notes (Signed)
Susan Holder had an extravasation of Taxol into her right distal dorsal upper extremity yesterday.  She was seen today for follow-up at 24 hours.  The area of induration which was noted yesterday is less prominent.  The patient exhibits no skin breakdown and denies pain.  She was told to continue management with cold compresses for 15 to 20 minutes 4 times daily for the next 24 hours and to continue to use an occlusive dressing to block exposure to sunlight.  She will return tomorrow for follow-up at 48 hours and then again next Monday.  The patient expresses understanding and agreement with this plan.  [name], MHS, PA-C Physician Assistant

## 2017-12-04 ENCOUNTER — Encounter: Payer: Self-pay | Admitting: Pulmonary Disease

## 2017-12-04 ENCOUNTER — Ambulatory Visit
Admission: RE | Admit: 2017-12-04 | Discharge: 2017-12-04 | Disposition: A | Discharge status: Home / Self Care | Payer: BC Managed Care – PPO | Source: Ambulatory Visit | Attending: Radiation Oncology | Admitting: Radiation Oncology

## 2017-12-04 ENCOUNTER — Ambulatory Visit: Payer: BC Managed Care – PPO | Admitting: Pulmonary Disease

## 2017-12-04 VITALS — BP 142/78 | HR 95 | Ht 60.0 in | Wt 128.4 lb

## 2017-12-04 DIAGNOSIS — C3491 Malignant neoplasm of unspecified part of right bronchus or lung: Secondary | ICD-10-CM

## 2017-12-04 DIAGNOSIS — C3490 Malignant neoplasm of unspecified part of unspecified bronchus or lung: Secondary | ICD-10-CM

## 2017-12-04 DIAGNOSIS — C3431 Malignant neoplasm of lower lobe, right bronchus or lung: Secondary | ICD-10-CM | POA: Diagnosis not present

## 2017-12-04 MED ORDER — SONAFINE EX EMUL
1.0000 "application " | Freq: Once | CUTANEOUS | Status: AC
  Administered 2017-12-04: 1 via TOPICAL

## 2017-12-04 MED ORDER — PANTOPRAZOLE SODIUM 40 MG PO TBEC: 40.0000 mg | DELAYED_RELEASE_TABLET | Freq: Every day | ORAL | 1 refills | Status: DC

## 2017-12-04 NOTE — Progress Notes (Signed)
Pt here for patient teaching.  Pt given Radiation and You booklet and Sonafine.  Reviewed areas of pertinence such as fatigue, hair loss, skin changes and throat changes . Pt able to give teach back of to pat skin,apply Sonafine bid and avoid applying anything to skin within 4 hours of treatment. Pt demonstrated understanding and verbalizes understanding of information given and will contact nursing with any questions or concerns.     Gloriajean Dell. Leonie Green, BSN

## 2017-12-04 NOTE — Patient Instructions (Signed)
We give you a prescription for Protonix 40 mg a day Use this along with Pepcid We will schedule you for PFTs and a return follow-up in 3 months time

## 2017-12-04 NOTE — Progress Notes (Signed)
Susan Holder    [id number]    [birth year]  Primary Care Physician:Patient, No Pcp Per  Referring Physician: No referring provider defined for this encounter.  Chief complaint: Follow-up for lung adenocarcinoma, stage IIIa (T3, N1/N2, M69)  HPI: 62 year old ex-smoker with new diagnosis of right hilar lung mass Evaluated at her primary care for recurrent bronchitis with chest x-ray that showed a right hilar opacity.  Subsequent CT scan and PET scan confirmed highly PET avid right hilar mass.  She has been referred to pulmonary for further evaluation  Complains of chronic cough, nonproductive in nature.  Denies any loss of weight, loss of appetite, hemoptysis.  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  Quit in July 2019 Travel history: No significant travel history  Interim history: Underwent bronchoscope on 7/15 with findings showing adenocarcinoma.  She follows with Dr. Julien Nordmann and Dr. Genia Harold and is starting chemotherapy radiation.  Chief complaint today is raspy voice that started after the bronchoscope.  She had minor hemoptysis postprocedure that has resolved.   Outpatient Encounter Medications as of 12/04/2017  Medication Sig  . albuterol (PROVENTIL HFA) 108 (90 Base) MCG/ACT inhaler Inhale 1 puff into the lungs every 6 (six) hours as needed for wheezing or shortness of breath.   Marland Kitchen aspirin EC 81 MG tablet Take 81 mg by mouth daily.  . famotidine (PEPCID) 40 MG tablet Take 40 mg by mouth daily.  . hydroxychloroquine (PLAQUENIL) 200 MG tablet Take 200 mg po BID Monday Friday (Patient taking differently: Take 200 mg po BID Monday- Friday)  . naproxen sodium (ALEVE) 220 MG tablet Take 220 mg by mouth daily as needed (PAIN).  . Probiotic Product (ALIGN PO) Take 1 capsule by mouth at bedtime.  . prochlorperazine (COMPAZINE) 10 MG tablet Take 1  tablet (10 mg total) by mouth every 6 (six) hours as needed for nausea or vomiting.  . pseudoephedrine-guaifenesin (MUCINEX D) 60-600 MG 12 hr tablet Take 1 tablet by mouth every 12 (twelve) hours.  . Vitamin D, Ergocalciferol, (DRISDOL) 50000 units CAPS capsule Take 1 capsule (50,000 Units total) by mouth every 30 (thirty) days.   No facility-administered encounter medications on file as of 12/04/2017.     Allergies as of 12/04/2017  . (No Known Allergies)    Past Medical History:  Diagnosis Date  . Rheumatoid arthritis [facility])     Past Surgical History:  Procedure Laterality Date  . BACK SURGERY    . BRONCHIAL NEEDLE ASPIRATION BIOPSY  11/09/2017   Procedure: BRONCHIAL NEEDLE ASPIRATION BIOPSIES;  Surgeon: Marshell Garfinkel, MD;  Location: [facility];  Service: Cardiopulmonary;;  . ENDOBRONCHIAL ULTRASOUND Bilateral 11/09/2017   Procedure: ENDOBRONCHIAL ULTRASOUND;  Surgeon: Marshell Garfinkel, MD;  Location: [facility];  Service: Cardiopulmonary;  Laterality: Bilateral;  . NASAL SINUS SURGERY    . NASAL SINUS SURGERY    . TUBAL LIGATION    . VIDEO BRONCHOSCOPY  11/09/2017   Procedure: VIDEO BRONCHOSCOPY;  Surgeon: Marshell Garfinkel, MD;  Location: [facility];  Service: Cardiopulmonary;;    Family History  Problem Relation Age of Onset  . Stroke Mother   . Alzheimer's disease Mother   . Heart disease Mother   . Emphysema Father   . Hypertension Brother   . Heart attack Maternal Aunt   . Heart failure Maternal Grandmother   . Hypertension Paternal Grandmother  Social History   Socioeconomic History  . Marital status: Divorced    Spouse name: Not on file  . Number of children: Not on file  . Years of education: Not on file  . Highest education level: Not on file  Occupational History  . Not on file  Social Needs  . Financial resource strain: Not on file  . Food insecurity:    Worry: Not on file    Inability: Not on file  . Transportation needs:    Medical: Not on  file    Non-medical: Not on file  Tobacco Use  . Smoking status: Former Smoker    Packs/day: 0.50    Years: 45.00    Pack years: 22.50    Types: Cigarettes    Last attempt to quit: 09/29/2017    Years since quitting: 0.1  . Smokeless tobacco: Never Used  Substance and Sexual Activity  . Alcohol use: Not Currently    Comment: rare  . Drug use: No  . Sexual activity: Not on file  Lifestyle  . Physical activity:    Days per week: Not on file    Minutes per session: Not on file  . Stress: Not on file  Relationships  . Social connections:    Talks on phone: Not on file    Gets together: Not on file    Attends religious service: Not on file    Active member of club or organization: Not on file    Attends meetings of clubs or organizations: Not on file    Relationship status: Not on file  . Intimate partner violence:    Fear of current or ex partner: Not on file    Emotionally abused: Not on file    Physically abused: Not on file    Forced sexual activity: Not on file  Other Topics Concern  . Not on file  Social History Narrative  . Not on file   Review of systems: Review of Systems  Constitutional: Negative for fever and chills.  HENT: Negative.   Eyes: Negative for blurred vision.  Respiratory: as per HPI  Cardiovascular: Negative for chest pain and palpitations.  Gastrointestinal: Negative for vomiting, diarrhea, blood per rectum. Genitourinary: Negative for dysuria, urgency, frequency and hematuria.  Musculoskeletal: Negative for myalgias, back pain and joint pain.  Skin: Negative for itching and rash.  Neurological: Negative for dizziness, tremors, focal weakness, seizures and loss of consciousness.  Endo/Heme/Allergies: Negative for environmental allergies.  Psychiatric/Behavioral: Negative for depression, suicidal ideas and hallucinations.  All other systems reviewed and are negative.  Physical Exam: Blood pressure (!) 142/78, pulse 95, height 5' (1.524 m),  weight 128 lb 6.4 oz (58.2 kg), last menstrual period 04/28/2000, SpO2 94 %. Gen:      No acute distress HEENT:  EOMI, sclera anicteric Neck:     No masses; no thyromegaly Lungs:    Clear to auscultation bilaterally; normal respiratory effort CV:         Regular rate and rhythm; no murmurs Abd:      + bowel sounds; soft, non-tender; no palpable masses, no distension Ext:    No edema; adequate peripheral perfusion Skin:      Warm and dry; no rash Neuro: alert and oriented x 3 Psych: normal mood and affect  Data Reviewed: CT chest 09/29/17- 4.6 x 3.9 cm mass at the right hilum as described above likely representing primary pulmonary malignancy. Associated adjacent subcarinal adenopathy. PET CT may be useful for further evaluation.  PET scan 10/06/17- right hilar mass, highly PET avid.  Extends into the subcarinal region.  No other mediastinal, supraclavicular lymph nodes.  I reviewed the images personally.  Pathology/50/19 Non-small cell lung cancer consistent with adenocarcinoma, staining pattern is suggestive of neuroendocrine differentiation  Assessment:  Lung cancer Currently on treatment for stage IIIa adenocarcinoma with chemotherapy, radiation.  Smoker Quit smoking last month.  Schedule PFTs for evaluation of COPD  Hoarseness of voice Started after the bronchoscope and may be vocal cord irritation from the ET tube.  May have significant GERD contributing to symptoms Add Protonix 40 mg a day.  Continue Pepcid If her symptoms continue then she may need an ENT evaluation.  Plan/Recommendations: - Add Protonix, continue Pepcid - PFTs  Marshell Garfinkel MD [facility] 12/04/2017, 4:47 PM

## 2017-12-04 NOTE — Progress Notes (Signed)
Ms. Bloch was seen today at 48-hour follow-up for an extravasation of Taxol in her right distal dorsal lower extremity on Monday.  The area of induration that was previously noted has resolved.  There is slight hyperpigmentation at the site without tenderness, exudate, or blistering.  The patient will return to the clinic next Monday for 1 week follow-up of this event.  [name], MHS, PA-C Physician Assistant

## 2017-12-07 ENCOUNTER — Inpatient Hospital Stay: Payer: BC Managed Care – PPO

## 2017-12-07 ENCOUNTER — Telehealth: Payer: Self-pay | Admitting: Medical

## 2017-12-07 ENCOUNTER — Ambulatory Visit
Admission: RE | Admit: 2017-12-07 | Discharge: 2017-12-07 | Disposition: A | Discharge status: Home / Self Care | Payer: BC Managed Care – PPO | Source: Ambulatory Visit | Attending: Radiation Oncology | Admitting: Radiation Oncology

## 2017-12-07 ENCOUNTER — Inpatient Hospital Stay (HOSPITAL_BASED_OUTPATIENT_CLINIC_OR_DEPARTMENT_OTHER): Payer: BC Managed Care – PPO | Admitting: Internal Medicine

## 2017-12-07 ENCOUNTER — Inpatient Hospital Stay: Payer: BC Managed Care – PPO | Admitting: Medical

## 2017-12-07 ENCOUNTER — Encounter: Payer: Self-pay | Admitting: Internal Medicine

## 2017-12-07 ENCOUNTER — Telehealth: Payer: Self-pay | Admitting: Internal Medicine

## 2017-12-07 VITALS — BP 132/65 | HR 86 | Temp 98.7°F | Resp 20 | Ht 60.0 in | Wt 129.9 lb

## 2017-12-07 DIAGNOSIS — C3491 Malignant neoplasm of unspecified part of right bronchus or lung: Secondary | ICD-10-CM

## 2017-12-07 DIAGNOSIS — C3431 Malignant neoplasm of lower lobe, right bronchus or lung: Secondary | ICD-10-CM

## 2017-12-07 DIAGNOSIS — M069 Rheumatoid arthritis, unspecified: Secondary | ICD-10-CM | POA: Diagnosis not present

## 2017-12-07 DIAGNOSIS — T80818D Extravasation of other vesicant agent, subsequent encounter: Secondary | ICD-10-CM

## 2017-12-07 DIAGNOSIS — Z5111 Encounter for antineoplastic chemotherapy: Secondary | ICD-10-CM

## 2017-12-07 LAB — CMP (CANCER CENTER ONLY)
ALT: 15 U/L (ref 0–44)
AST: 19 U/L (ref 15–41)
Albumin: 3.5 g/dL (ref 3.5–5.0)
Alkaline Phosphatase: 102 U/L (ref 38–126)
Anion gap: 10 (ref 5–15)
BUN: 14 mg/dL (ref 8–23)
CO2: 22 mmol/L (ref 22–32)
Calcium: 9.1 mg/dL (ref 8.9–10.3)
Chloride: 105 mmol/L (ref 98–111)
Creatinine: 0.8 mg/dL (ref 0.44–1.00)
GFR, Est AFR Am: >60 mL/min (ref >60)
GFR, Estimated: >60 mL/min (ref >60)
Glucose, Bld: 85 mg/dL (ref 70–99)
Potassium: 4.3 mmol/L (ref 3.5–5.1)
Sodium: 137 mmol/L (ref 135–145)
Total Bilirubin: <0.2 mg/dL — ABNORMAL LOW (ref 0.3–1.2)
Total Protein: 7.1 g/dL (ref 6.5–8.1)

## 2017-12-07 LAB — CBC WITH DIFFERENTIAL (CANCER CENTER ONLY)
Basophils Absolute: 0 10*3/uL (ref 0.0–0.1)
Basophils Relative: 1 %
Eosinophils Absolute: 0.2 10*3/uL (ref 0.0–0.5)
Eosinophils Relative: 3 %
HCT: 34 % — ABNORMAL LOW (ref 34.8–46.6)
Hemoglobin: 11.4 g/dL — ABNORMAL LOW (ref 11.6–15.9)
Lymphocytes Relative: 23 %
Lymphs Abs: 1.5 10*3/uL (ref 0.9–3.3)
MCH: 30.8 pg (ref 25.1–34.0)
MCHC: 33.5 g/dL (ref 31.5–36.0)
MCV: 91.9 fL (ref 79.5–101.0)
Monocytes Absolute: 0.6 10*3/uL (ref 0.1–0.9)
Monocytes Relative: 9 %
Neutro Abs: 4.2 10*3/uL (ref 1.5–6.5)
Neutrophils Relative %: 64 %
Platelet Count: 297 10*3/uL (ref 145–400)
RBC: 3.7 MIL/uL (ref 3.70–5.45)
RDW: 14.1 % (ref 11.2–14.5)
WBC Count: 6.5 10*3/uL (ref 3.9–10.3)

## 2017-12-07 MED ORDER — FAMOTIDINE IN NACL 20-0.9 MG/50ML-% IV SOLN
20.0000 mg | Freq: Once | INTRAVENOUS | Status: AC
  Administered 2017-12-07: 20 mg via INTRAVENOUS

## 2017-12-07 MED ORDER — PALONOSETRON HCL INJECTION 0.25 MG/5ML
0.2500 mg | Freq: Once | INTRAVENOUS | Status: AC
  Administered 2017-12-07: 0.25 mg via INTRAVENOUS

## 2017-12-07 MED ORDER — SODIUM CHLORIDE 0.9 % IV SOLN
45.0000 mg/m2 | Freq: Once | INTRAVENOUS | Status: AC
  Administered 2017-12-07: 72 mg via INTRAVENOUS
  Filled 2017-12-07: qty 12

## 2017-12-07 MED ORDER — SODIUM CHLORIDE 0.9 % IV SOLN
180.8000 mg | Freq: Once | INTRAVENOUS | Status: AC
  Administered 2017-12-07: 180 mg via INTRAVENOUS
  Filled 2017-12-07: qty 18

## 2017-12-07 MED ORDER — DIPHENHYDRAMINE HCL 50 MG/ML IJ SOLN
50.0000 mg | Freq: Once | INTRAMUSCULAR | Status: AC
  Administered 2017-12-07: 50 mg via INTRAVENOUS

## 2017-12-07 MED ORDER — SODIUM CHLORIDE 0.9 % IV SOLN
Freq: Once | INTRAVENOUS | Status: AC
  Administered 2017-12-07: 09:00:00 via INTRAVENOUS
  Filled 2017-12-07: qty 250

## 2017-12-07 MED ORDER — SODIUM CHLORIDE 0.9 % IV SOLN
20.0000 mg | Freq: Once | INTRAVENOUS | Status: AC
  Administered 2017-12-07: 20 mg via INTRAVENOUS
  Filled 2017-12-07: qty 2

## 2017-12-07 MED ORDER — PALONOSETRON HCL INJECTION 0.25 MG/5ML
INTRAVENOUS | Status: AC
  Filled 2017-12-07: qty 5

## 2017-12-07 MED ORDER — FAMOTIDINE IN NACL 20-0.9 MG/50ML-% IV SOLN
INTRAVENOUS | Status: AC
  Filled 2017-12-07: qty 50

## 2017-12-07 MED ORDER — DIPHENHYDRAMINE HCL 50 MG/ML IJ SOLN
INTRAMUSCULAR | Status: AC
Start: 2017-12-07 — End: ?
  Filled 2017-12-07: qty 1

## 2017-12-07 NOTE — Patient Instructions (Signed)
[facility] Discharge Instructions for Patients Receiving Chemotherapy  Today you received the following chemotherapy agents Paclitaxel, Carboplatin  To help prevent nausea and vomiting after your treatment, we encourage you to take your nausea medication as directed   If you develop nausea and vomiting that is not controlled by your nausea medication, call the clinic.   BELOW ARE SYMPTOMS THAT SHOULD BE REPORTED IMMEDIATELY:  *FEVER GREATER THAN 100.5 F  *CHILLS WITH OR WITHOUT FEVER  NAUSEA AND VOMITING THAT IS NOT CONTROLLED WITH YOUR NAUSEA MEDICATION  *UNUSUAL SHORTNESS OF BREATH  *UNUSUAL BRUISING OR BLEEDING  TENDERNESS IN MOUTH AND THROAT WITH OR WITHOUT PRESENCE OF ULCERS  *URINARY PROBLEMS  *BOWEL PROBLEMS  UNUSUAL RASH Items with * indicate a potential emergency and should be followed up as soon as possible.  Feel free to call the clinic should you have any questions or concerns. The clinic phone number is [phone].  Please show the [facility] at check-in to the Emergency Department and triage nurse.   Paclitaxel injection What is this medicine? PACLITAXEL (PAK li TAX el) is a chemotherapy drug. It targets fast dividing cells, like cancer cells, and causes these cells to die. This medicine is used to treat ovarian cancer, breast cancer, and other cancers. This medicine may be used for other purposes; ask your health care provider or pharmacist if you have questions. COMMON BRAND NAME(S): Onxol, Taxol What should I tell my health care provider before I take this medicine? They need to know if you have any of these conditions: -blood disorders -irregular heartbeat -infection (especially a virus infection such as chickenpox, cold sores, or herpes) -liver disease -previous or ongoing radiation therapy -an unusual or allergic reaction to paclitaxel, alcohol, polyoxyethylated castor oil, other chemotherapy agents, other medicines, foods,  dyes, or preservatives -pregnant or trying to get pregnant -breast-feeding How should I use this medicine? This drug is given as an infusion into a vein. It is administered in a hospital or clinic by a specially trained health care professional. Talk to your pediatrician regarding the use of this medicine in children. Special care may be needed. Overdosage: If you think you have taken too much of this medicine contact a poison control center or emergency room at once. NOTE: This medicine is only for you. Do not share this medicine with others. What if I miss a dose? It is important not to miss your dose. Call your doctor or health care professional if you are unable to keep an appointment. What may interact with this medicine? Do not take this medicine with any of the following medications: -disulfiram -metronidazole This medicine may also interact with the following medications: -cyclosporine -diazepam -ketoconazole -medicines to increase blood counts like filgrastim, pegfilgrastim, sargramostim -other chemotherapy drugs like cisplatin, doxorubicin, epirubicin, etoposide, teniposide, vincristine -quinidine -testosterone -vaccines -verapamil Talk to your doctor or health care professional before taking any of these medicines: -acetaminophen -aspirin -ibuprofen -ketoprofen -naproxen This list may not describe all possible interactions. Give your health care provider a list of all the medicines, herbs, non-prescription drugs, or dietary supplements you use. Also tell them if you smoke, drink alcohol, or use illegal drugs. Some items may interact with your medicine. What should I watch for while using this medicine? Your condition will be monitored carefully while you are receiving this medicine. You will need important blood work done while you are taking this medicine. This medicine can cause serious allergic reactions. To reduce your risk you will need  to take other medicine(s)  before treatment with this medicine. If you experience allergic reactions like skin rash, itching or hives, swelling of the face, lips, or tongue, tell your doctor or health care professional right away. In some cases, you may be given additional medicines to help with side effects. Follow all directions for their use. This drug may make you feel generally unwell. This is not uncommon, as chemotherapy can affect healthy cells as well as cancer cells. Report any side effects. Continue your course of treatment even though you feel ill unless your doctor tells you to stop. Call your doctor or health care professional for advice if you get a fever, chills or sore throat, or other symptoms of a cold or flu. Do not treat yourself. This drug decreases your body's ability to fight infections. Try to avoid being around people who are sick. This medicine may increase your risk to bruise or bleed. Call your doctor or health care professional if you notice any unusual bleeding. Be careful brushing and flossing your teeth or using a toothpick because you may get an infection or bleed more easily. If you have any dental work done, tell your dentist you are receiving this medicine. Avoid taking products that contain aspirin, acetaminophen, ibuprofen, naproxen, or ketoprofen unless instructed by your doctor. These medicines may hide a fever. Do not become pregnant while taking this medicine. Women should inform their doctor if they wish to become pregnant or think they might be pregnant. There is a potential for serious side effects to an unborn child. Talk to your health care professional or pharmacist for more information. Do not breast-feed an infant while taking this medicine. Men are advised not to father a child while receiving this medicine. This product may contain alcohol. Ask your pharmacist or healthcare provider if this medicine contains alcohol. Be sure to tell all healthcare providers you are taking this  medicine. Certain medicines, like metronidazole and disulfiram, can cause an unpleasant reaction when taken with alcohol. The reaction includes flushing, headache, nausea, vomiting, sweating, and increased thirst. The reaction can last from 30 minutes to several hours. What side effects may I notice from receiving this medicine? Side effects that you should report to your doctor or health care professional as soon as possible: -allergic reactions like skin rash, itching or hives, swelling of the face, lips, or tongue -low blood counts - This drug may decrease the number of white blood cells, red blood cells and platelets. You may be at increased risk for infections and bleeding. -signs of infection - fever or chills, cough, sore throat, pain or difficulty passing urine -signs of decreased platelets or bleeding - bruising, pinpoint red spots on the skin, black, tarry stools, nosebleeds -signs of decreased red blood cells - unusually weak or tired, fainting spells, lightheadedness -breathing problems -chest pain -high or low blood pressure -mouth sores -nausea and vomiting -pain, swelling, redness or irritation at the injection site -pain, tingling, numbness in the hands or feet -slow or irregular heartbeat -swelling of the ankle, feet, hands Side effects that usually do not require medical attention (report to your doctor or health care professional if they continue or are bothersome): -bone pain -complete hair loss including hair on your head, underarms, pubic hair, eyebrows, and eyelashes -changes in the color of fingernails -diarrhea -loosening of the fingernails -loss of appetite -muscle or joint pain -red flush to skin -sweating This list may not describe all possible side effects. Call your doctor for medical advice  about side effects. You may report side effects to FDA at 1-800-FDA-1088. Where should I keep my medicine? This drug is given in a hospital or clinic and will not be  stored at home. NOTE: This sheet is a summary. It may not cover all possible information. If you have questions about this medicine, talk to your doctor, pharmacist, or health care provider.  2018 Elsevier/Gold Standard (2015-02-13 19:58:00)  Carboplatin injection What is this medicine? CARBOPLATIN (KAR boe pla tin) is a chemotherapy drug. It targets fast dividing cells, like cancer cells, and causes these cells to die. This medicine is used to treat ovarian cancer and many other cancers. This medicine may be used for other purposes; ask your health care provider or pharmacist if you have questions. COMMON BRAND NAME(S): Paraplatin What should I tell my health care provider before I take this medicine? They need to know if you have any of these conditions: -blood disorders -hearing problems -kidney disease -recent or ongoing radiation therapy -an unusual or allergic reaction to carboplatin, cisplatin, other chemotherapy, other medicines, foods, dyes, or preservatives -pregnant or trying to get pregnant -breast-feeding How should I use this medicine? This drug is usually given as an infusion into a vein. It is administered in a hospital or clinic by a specially trained health care professional. Talk to your pediatrician regarding the use of this medicine in children. Special care may be needed. Overdosage: If you think you have taken too much of this medicine contact a poison control center or emergency room at once. NOTE: This medicine is only for you. Do not share this medicine with others. What if I miss a dose? It is important not to miss a dose. Call your doctor or health care professional if you are unable to keep an appointment. What may interact with this medicine? -medicines for seizures -medicines to increase blood counts like filgrastim, pegfilgrastim, sargramostim -some antibiotics like amikacin, gentamicin, neomycin, streptomycin, tobramycin -vaccines Talk to your doctor or  health care professional before taking any of these medicines: -acetaminophen -aspirin -ibuprofen -ketoprofen -naproxen This list may not describe all possible interactions. Give your health care provider a list of all the medicines, herbs, non-prescription drugs, or dietary supplements you use. Also tell them if you smoke, drink alcohol, or use illegal drugs. Some items may interact with your medicine. What should I watch for while using this medicine? Your condition will be monitored carefully while you are receiving this medicine. You will need important blood work done while you are taking this medicine. This drug may make you feel generally unwell. This is not uncommon, as chemotherapy can affect healthy cells as well as cancer cells. Report any side effects. Continue your course of treatment even though you feel ill unless your doctor tells you to stop. In some cases, you may be given additional medicines to help with side effects. Follow all directions for their use. Call your doctor or health care professional for advice if you get a fever, chills or sore throat, or other symptoms of a cold or flu. Do not treat yourself. This drug decreases your body's ability to fight infections. Try to avoid being around people who are sick. This medicine may increase your risk to bruise or bleed. Call your doctor or health care professional if you notice any unusual bleeding. Be careful brushing and flossing your teeth or using a toothpick because you may get an infection or bleed more easily. If you have any dental work done, tell your dentist  you are receiving this medicine. Avoid taking products that contain aspirin, acetaminophen, ibuprofen, naproxen, or ketoprofen unless instructed by your doctor. These medicines may hide a fever. Do not become pregnant while taking this medicine. Women should inform their doctor if they wish to become pregnant or think they might be pregnant. There is a potential for  serious side effects to an unborn child. Talk to your health care professional or pharmacist for more information. Do not breast-feed an infant while taking this medicine. What side effects may I notice from receiving this medicine? Side effects that you should report to your doctor or health care professional as soon as possible: -allergic reactions like skin rash, itching or hives, swelling of the face, lips, or tongue -signs of infection - fever or chills, cough, sore throat, pain or difficulty passing urine -signs of decreased platelets or bleeding - bruising, pinpoint red spots on the skin, black, tarry stools, nosebleeds -signs of decreased red blood cells - unusually weak or tired, fainting spells, lightheadedness -breathing problems -changes in hearing -changes in vision -chest pain -high blood pressure -low blood counts - This drug may decrease the number of white blood cells, red blood cells and platelets. You may be at increased risk for infections and bleeding. -nausea and vomiting -pain, swelling, redness or irritation at the injection site -pain, tingling, numbness in the hands or feet -problems with balance, talking, walking -trouble passing urine or change in the amount of urine Side effects that usually do not require medical attention (report to your doctor or health care professional if they continue or are bothersome): -hair loss -loss of appetite -metallic taste in the mouth or changes in taste This list may not describe all possible side effects. Call your doctor for medical advice about side effects. You may report side effects to FDA at 1-800-FDA-1088. Where should I keep my medicine? This drug is given in a hospital or clinic and will not be stored at home. NOTE: This sheet is a summary. It may not cover all possible information. If you have questions about this medicine, talk to your doctor, pharmacist, or health care provider.  2018 Elsevier/Gold Standard  (2007-07-20 14:38:05)

## 2017-12-07 NOTE — Telephone Encounter (Signed)
Appts scheduled AVS/Calendar pritned per 8/12 los

## 2017-12-07 NOTE — Progress Notes (Signed)
[place] [phone]   [phone]  OFFICE PROGRESS NOTE  Patient, No Pcp Per No address on file  DIAGNOSIS: Stage IIIA (T3, N1/N2, M0) non-small cell lung cancer, adenocarcinoma presented with large right lower lobe lung mass with extension to the right hilum and subcarinal area diagnosed in July 2019.  Biomarker Findings Tumor Mutational Burden - TMB-Intermediate (6 Muts/Mb) Microsatellite status - MS-Stable Genomic Findings For a complete list of the genes assayed, please refer to the Appendix. NRAS Q61R ARAF amplification STK11 G56W KRAS G13D MYCN amplification MCL1 amplification NKX2-1 amplification - equivocal? TP53 G245V 7 Disease relevant genes with no reportable alterations: EGFR, ALK, BRAF, MET, ERBB2, RET, ROS1   PRIOR THERAPY: None.  CURRENT THERAPY: Course of concurrent chemoradiation with weekly carboplatin for AUC of 2 and paclitaxel 45 mg/M2.  Status post 1 cycle.  INTERVAL HISTORY: LISBET BUSKER 62 y.o. female returns to the clinic today for follow-up visit.  The patient is feeling fine today with no specific complaints.  She denied having any chest pain, shortness breath, cough or hemoptysis.  She continues to have some hoarseness of her voice after the bronchoscopy.  She denied having any fever or chills.  She has no nausea, vomiting, diarrhea or constipation.  She tolerated the first week of her treatment fairly well.  She had molecular studies by foundation 1 that showed no actionable mutations.  The patient is here today for evaluation before starting cycle #2.  MEDICAL HISTORY: Past Medical History:  Diagnosis Date  . Rheumatoid arthritis ([place])     ALLERGIES:  has No Known Allergies.  MEDICATIONS:  Current Outpatient Medications  Medication Sig Dispense Refill  . albuterol (PROVENTIL HFA) 108 (90 Base) MCG/ACT inhaler Inhale 1 puff into the lungs every 6 (six) hours as needed for wheezing or shortness of  breath.     Marland Kitchen aspirin EC 81 MG tablet Take 81 mg by mouth daily.    . famotidine (PEPCID) 40 MG tablet Take 40 mg by mouth daily.    . hydroxychloroquine (PLAQUENIL) 200 MG tablet Take 200 mg po BID Monday Friday (Patient taking differently: Take 200 mg po BID Monday- Friday) 60 tablet 2  . naproxen sodium (ALEVE) 220 MG tablet Take 220 mg by mouth daily as needed (PAIN).    Marland Kitchen pantoprazole (PROTONIX) 40 MG tablet Take 1 tablet (40 mg total) by mouth at bedtime. 30 tablet 1  . Probiotic Product (ALIGN PO) Take 1 capsule by mouth at bedtime.    . prochlorperazine (COMPAZINE) 10 MG tablet Take 1 tablet (10 mg total) by mouth every 6 (six) hours as needed for nausea or vomiting. 30 tablet 0  . pseudoephedrine-guaifenesin (MUCINEX D) 60-600 MG 12 hr tablet Take 1 tablet by mouth every 12 (twelve) hours.    . Vitamin D, Ergocalciferol, (DRISDOL) 50000 units CAPS capsule Take 1 capsule (50,000 Units total) by mouth every 30 (thirty) days. 3 capsule 1   No current facility-administered medications for this visit.     SURGICAL HISTORY:  Past Surgical History:  Procedure Laterality Date  . BACK SURGERY    . BRONCHIAL NEEDLE ASPIRATION BIOPSY  11/09/2017   Procedure: BRONCHIAL NEEDLE ASPIRATION BIOPSIES;  Surgeon: Marshell Garfinkel, MD;  Location: [facility];  Service: Cardiopulmonary;;  . ENDOBRONCHIAL ULTRASOUND Bilateral 11/09/2017   Procedure: ENDOBRONCHIAL ULTRASOUND;  Surgeon: Marshell Garfinkel, MD;  Location: [facility];  Service: Cardiopulmonary;  Laterality: Bilateral;  . NASAL SINUS SURGERY    . NASAL  SINUS SURGERY    . TUBAL LIGATION    . VIDEO BRONCHOSCOPY  11/09/2017   Procedure: VIDEO BRONCHOSCOPY;  Surgeon: Marshell Garfinkel, MD;  Location: [facility];  Service: Cardiopulmonary;;    REVIEW OF SYSTEMS:  A comprehensive review of systems was negative except for: Constitutional: positive for fatigue   PHYSICAL EXAMINATION: General appearance: alert, cooperative, fatigued and no  distress Head: Normocephalic, without obvious abnormality, atraumatic Neck: no adenopathy, no JVD, supple, symmetrical, trachea midline and thyroid not enlarged, symmetric, no tenderness/mass/nodules Lymph nodes: Cervical, supraclavicular, and axillary nodes normal. Resp: clear to auscultation bilaterally Back: symmetric, no curvature. ROM normal. No CVA tenderness. Cardio: regular rate and rhythm, S1, S2 normal, no murmur, click, rub or gallop GI: soft, non-tender; bowel sounds normal; no masses,  no organomegaly Extremities: extremities normal, atraumatic, no cyanosis or edema  ECOG PERFORMANCE STATUS: 1 - Symptomatic but completely ambulatory  Blood pressure 132/65, pulse 86, temperature 98.7 F (37.1 C), temperature source Oral, resp. rate 20, height 5' (1.524 m), weight 129 lb 14.4 oz (58.9 kg), last menstrual period 04/28/2000, SpO2 100 %.  LABORATORY DATA: Lab Results  Component Value Date   WBC 6.5 12/07/2017   HGB 11.4 (L) 12/07/2017   HCT 34.0 (L) 12/07/2017   MCV 91.9 12/07/2017   PLT 297 12/07/2017      Chemistry      Component Value Date/Time   NA 139 11/30/2017 0755   K 4.0 11/30/2017 0755   CL 108 11/30/2017 0755   CO2 19 (L) 11/30/2017 0755   BUN 11 11/30/2017 0755   CREATININE 0.81 11/30/2017 0755   CREATININE 0.80 07/24/2017 1438      Component Value Date/Time   CALCIUM 9.6 11/30/2017 0755   ALKPHOS 100 11/30/2017 0755   AST 21 11/30/2017 0755   ALT 14 11/30/2017 0755   BILITOT 0.3 11/30/2017 0755       RADIOGRAPHIC STUDIES: Mr Jeri Cos YQ Contrast  Result Date: 11/24/2017 CLINICAL DATA:  62 year old female with non-small cell lung cancer. Staging. EXAM: MRI HEAD WITHOUT AND WITH CONTRAST TECHNIQUE: Multiplanar, multiecho pulse sequences of the brain and surrounding structures were obtained without and with intravenous contrast. CONTRAST:  59m MULTIHANCE GADOBENATE DIMEGLUMINE 529 MG/ML IV SOLN COMPARISON:  PET-CT 10/06/2017. FINDINGS: Brain: No  abnormal enhancement identified. No midline shift, mass effect, or evidence of intracranial mass lesion. No dural thickening. Cerebral volume is within normal limits for age. No restricted diffusion to suggest acute infarction. No ventriculomegaly, extra-axial collection or acute intracranial hemorrhage. Cervicomedullary junction and pituitary are within normal limits. Mild to moderate for age scattered cerebral white matter T2 and FLAIR hyperintense foci in a nonspecific configuration. Mostly subcortical white matter involvement. No cortical encephalomalacia or chronic cerebral blood products identified. The deep gray matter nuclei, brainstem, and cerebellum appear normal. Vascular: Major intracranial vascular flow voids are preserved. The major dural venous sinuses are enhancing and appear patent. Skull and upper cervical spine: Negative visible cervical spine and spinal cord. Visualized bone marrow signal is within normal limits. Sinuses/Orbits: Normal orbits soft tissues. Paranasal sinuses are clear. Other: Mastoid air cells are clear. Visible internal auditory structures appear normal. Scalp face soft tissues appear negative; both parotid ducts are somewhat prominent but the glands otherwise appear normal. IMPRESSION: 1.  No metastatic disease or acute intracranial abnormality. 2. Mild to moderate for age nonspecific cerebral white matter signal changes. Electronically Signed   By: HGenevie AnnM.D.   On: 11/24/2017 07:54   [facility]  Result Date: 11/09/2017 CLINICAL DATA:  Post bronchoscopy.  Lung mass EXAM: PORTABLE CHEST 1 VIEW COMPARISON:  PET/CT 10/06/2017 FINDINGS: Right lower lobe airspace disease and right hilar density compatible with known mass lesion. Small right effusion. Negative for pneumothorax post biopsy. Heart size is normal.  Left lung is clear. IMPRESSION: Right hilar mass in right lower lobe airspace disease with small right effusion. Negative for pneumothorax post biopsy.  Electronically Signed   By: Franchot Gallo M.D.   On: 11/09/2017 15:01    ASSESSMENT AND PLAN: This is a very pleasant 62 years old African-American female recently diagnosed with a stage IIIa non-small cell lung cancer, adenocarcinoma.  She is currently undergoing a course of concurrent chemoradiation with weekly carboplatin and paclitaxel status post 1 cycle.  She is tolerating her treatment well with no concerning complaints. I recommended for the patient to proceed with cycle #2 today as a schedule. I will see her back for follow-up visit in 2 weeks for evaluation before starting cycle #4. Her molecular study showed no actionable mutations and this was discussed with the patient today. She was advised to call immediately if she has any concerning symptoms in the interval. The patient voices understanding of current disease status and treatment options and is in agreement with the current care plan.  All questions were answered. The patient knows to call the clinic with any problems, questions or concerns. We can certainly see the patient much sooner if necessary.  I spent 10 minutes counseling the patient face to face. The total time spent in the appointment was 15 minutes.  Disclaimer: This note was dictated with voice recognition software. Similar sounding words can inadvertently be transcribed and may not be corrected upon review.

## 2017-12-07 NOTE — Telephone Encounter (Signed)
Patient scheduled per 8/12 sch message

## 2017-12-08 ENCOUNTER — Other Ambulatory Visit: Payer: BC Managed Care – PPO

## 2017-12-08 ENCOUNTER — Ambulatory Visit: Payer: BC Managed Care – PPO

## 2017-12-08 ENCOUNTER — Ambulatory Visit
Admission: RE | Admit: 2017-12-08 | Discharge: 2017-12-08 | Disposition: A | Discharge status: Home / Self Care | Payer: BC Managed Care – PPO | Source: Ambulatory Visit | Attending: Radiation Oncology | Admitting: Radiation Oncology

## 2017-12-08 ENCOUNTER — Ambulatory Visit: Payer: BC Managed Care – PPO | Admitting: Internal Medicine

## 2017-12-08 DIAGNOSIS — C3431 Malignant neoplasm of lower lobe, right bronchus or lung: Secondary | ICD-10-CM | POA: Diagnosis not present

## 2017-12-08 NOTE — Progress Notes (Signed)
Susan Holder was seen today one week follow-up for an extravasation of Taxol in her right distal dorsal lower extremity on Monday, August 5.  The area of induration that was previously noted has resolved.  There is no tenderness, exudate, or blistering.  The patient will return to the clinic as scheduled.  [name], MHS, PA-C Physician Assistant

## 2017-12-09 ENCOUNTER — Ambulatory Visit
Admission: RE | Admit: 2017-12-09 | Discharge: 2017-12-09 | Disposition: A | Discharge status: Home / Self Care | Payer: BC Managed Care – PPO | Source: Ambulatory Visit | Attending: Radiation Oncology | Admitting: Radiation Oncology

## 2017-12-09 DIAGNOSIS — C3431 Malignant neoplasm of lower lobe, right bronchus or lung: Secondary | ICD-10-CM | POA: Diagnosis not present

## 2017-12-10 ENCOUNTER — Ambulatory Visit
Admission: RE | Admit: 2017-12-10 | Discharge: 2017-12-10 | Disposition: A | Discharge status: Home / Self Care | Payer: BC Managed Care – PPO | Source: Ambulatory Visit | Attending: Radiation Oncology | Admitting: Radiation Oncology

## 2017-12-10 DIAGNOSIS — C3431 Malignant neoplasm of lower lobe, right bronchus or lung: Secondary | ICD-10-CM | POA: Diagnosis not present

## 2017-12-11 ENCOUNTER — Ambulatory Visit
Admission: RE | Admit: 2017-12-11 | Discharge: 2017-12-11 | Disposition: A | Discharge status: Home / Self Care | Payer: BC Managed Care – PPO | Source: Ambulatory Visit | Attending: Radiation Oncology | Admitting: Radiation Oncology

## 2017-12-11 DIAGNOSIS — C3431 Malignant neoplasm of lower lobe, right bronchus or lung: Secondary | ICD-10-CM | POA: Diagnosis not present

## 2017-12-13 ENCOUNTER — Ambulatory Visit: Admission: RE | Admit: 2017-12-13 | Payer: BC Managed Care – PPO | Source: Ambulatory Visit

## 2017-12-14 ENCOUNTER — Ambulatory Visit
Admission: RE | Admit: 2017-12-14 | Discharge: 2017-12-14 | Disposition: A | Discharge status: Home / Self Care | Payer: BC Managed Care – PPO | Source: Ambulatory Visit | Attending: Radiation Oncology | Admitting: Radiation Oncology

## 2017-12-14 ENCOUNTER — Inpatient Hospital Stay: Payer: BC Managed Care – PPO

## 2017-12-14 VITALS — BP 129/68 | HR 95 | Temp 98.5°F | Resp 18 | Ht 60.0 in | Wt 130.2 lb

## 2017-12-14 DIAGNOSIS — Z5111 Encounter for antineoplastic chemotherapy: Secondary | ICD-10-CM | POA: Diagnosis not present

## 2017-12-14 DIAGNOSIS — C3431 Malignant neoplasm of lower lobe, right bronchus or lung: Secondary | ICD-10-CM | POA: Diagnosis not present

## 2017-12-14 DIAGNOSIS — C3491 Malignant neoplasm of unspecified part of right bronchus or lung: Secondary | ICD-10-CM

## 2017-12-14 LAB — CBC WITH DIFFERENTIAL (CANCER CENTER ONLY)
Basophils Absolute: 0 10*3/uL (ref 0.0–0.1)
Basophils Relative: 0 %
Eosinophils Absolute: 0.1 10*3/uL (ref 0.0–0.5)
Eosinophils Relative: 2 %
HCT: 34.3 % — ABNORMAL LOW (ref 34.8–46.6)
Hemoglobin: 11 g/dL — ABNORMAL LOW (ref 11.6–15.9)
Lymphocytes Relative: 16 %
Lymphs Abs: 0.9 10*3/uL (ref 0.9–3.3)
MCH: 30.1 pg (ref 25.1–34.0)
MCHC: 32.1 g/dL (ref 31.5–36.0)
MCV: 93.7 fL (ref 79.5–101.0)
Monocytes Absolute: 0.7 10*3/uL (ref 0.1–0.9)
Monocytes Relative: 12 %
Neutro Abs: 4.1 10*3/uL (ref 1.5–6.5)
Neutrophils Relative %: 70 %
Platelet Count: 332 10*3/uL (ref 145–400)
RBC: 3.66 MIL/uL — ABNORMAL LOW (ref 3.70–5.45)
RDW: 13.8 % (ref 11.2–14.5)
WBC Count: 5.8 10*3/uL (ref 3.9–10.3)

## 2017-12-14 LAB — CMP (CANCER CENTER ONLY)
ALT: 14 U/L (ref 0–44)
AST: 19 U/L (ref 15–41)
Albumin: 3.3 g/dL — ABNORMAL LOW (ref 3.5–5.0)
Alkaline Phosphatase: 98 U/L (ref 38–126)
Anion gap: 7 (ref 5–15)
BUN: 9 mg/dL (ref 8–23)
CO2: 24 mmol/L (ref 22–32)
Calcium: 9.4 mg/dL (ref 8.9–10.3)
Chloride: 108 mmol/L (ref 98–111)
Creatinine: 0.7 mg/dL (ref 0.44–1.00)
GFR, Est AFR Am: >60 mL/min (ref >60)
GFR, Estimated: >60 mL/min (ref >60)
Glucose, Bld: 101 mg/dL — ABNORMAL HIGH (ref 70–99)
Potassium: 4 mmol/L (ref 3.5–5.1)
Sodium: 139 mmol/L (ref 135–145)
Total Bilirubin: <0.2 mg/dL — ABNORMAL LOW (ref 0.3–1.2)
Total Protein: 7 g/dL (ref 6.5–8.1)

## 2017-12-14 MED ORDER — FAMOTIDINE IN NACL 20-0.9 MG/50ML-% IV SOLN
INTRAVENOUS | Status: AC
Start: 2017-12-14 — End: ?
  Filled 2017-12-14: qty 50

## 2017-12-14 MED ORDER — DEXAMETHASONE SODIUM PHOSPHATE 100 MG/10ML IJ SOLN
20.0000 mg | Freq: Once | INTRAMUSCULAR | Status: AC
  Administered 2017-12-14: 20 mg via INTRAVENOUS
  Filled 2017-12-14: qty 2

## 2017-12-14 MED ORDER — SODIUM CHLORIDE 0.9 % IV SOLN
180.0000 mg | Freq: Once | INTRAVENOUS | Status: AC
  Administered 2017-12-14: 180 mg via INTRAVENOUS
  Filled 2017-12-14: qty 18

## 2017-12-14 MED ORDER — FAMOTIDINE IN NACL 20-0.9 MG/50ML-% IV SOLN
20.0000 mg | Freq: Once | INTRAVENOUS | Status: AC
  Administered 2017-12-14: 20 mg via INTRAVENOUS

## 2017-12-14 MED ORDER — DIPHENHYDRAMINE HCL 50 MG/ML IJ SOLN
INTRAMUSCULAR | Status: AC
  Filled 2017-12-14: qty 1

## 2017-12-14 MED ORDER — SODIUM CHLORIDE 0.9 % IV SOLN
Freq: Once | INTRAVENOUS | Status: AC
  Administered 2017-12-14: 10:00:00 via INTRAVENOUS
  Filled 2017-12-14: qty 250

## 2017-12-14 MED ORDER — PALONOSETRON HCL INJECTION 0.25 MG/5ML
INTRAVENOUS | Status: AC
  Filled 2017-12-14: qty 5

## 2017-12-14 MED ORDER — PALONOSETRON HCL INJECTION 0.25 MG/5ML
0.2500 mg | Freq: Once | INTRAVENOUS | Status: AC
  Administered 2017-12-14: 0.25 mg via INTRAVENOUS

## 2017-12-14 MED ORDER — SODIUM CHLORIDE 0.9 % IV SOLN
45.0000 mg/m2 | Freq: Once | INTRAVENOUS | Status: AC
  Administered 2017-12-14: 72 mg via INTRAVENOUS
  Filled 2017-12-14: qty 12

## 2017-12-14 MED ORDER — DIPHENHYDRAMINE HCL 50 MG/ML IJ SOLN
50.0000 mg | Freq: Once | INTRAMUSCULAR | Status: AC
  Administered 2017-12-14: 50 mg via INTRAVENOUS

## 2017-12-14 NOTE — Patient Instructions (Signed)
[facility] Discharge Instructions for Patients Receiving Chemotherapy  Today you received the following chemotherapy agents: Paclitaxel (Taxol) and Carboplatin (Paraplatin)  To help prevent nausea and vomiting after your treatment, we encourage you to take your nausea medication as prescribed. Received Aloxi during treatment today-->Take Compazine (not Zofran) for the next 3 days as needed.   If you develop nausea and vomiting that is not controlled by your nausea medication, call the clinic.   BELOW ARE SYMPTOMS THAT SHOULD BE REPORTED IMMEDIATELY:  *FEVER GREATER THAN 100.5 F  *CHILLS WITH OR WITHOUT FEVER  NAUSEA AND VOMITING THAT IS NOT CONTROLLED WITH YOUR NAUSEA MEDICATION  *UNUSUAL SHORTNESS OF BREATH  *UNUSUAL BRUISING OR BLEEDING  TENDERNESS IN MOUTH AND THROAT WITH OR WITHOUT PRESENCE OF ULCERS  *URINARY PROBLEMS  *BOWEL PROBLEMS  UNUSUAL RASH Items with * indicate a potential emergency and should be followed up as soon as possible.  Feel free to call the clinic should you have any questions or concerns. The clinic phone number is [phone].  Please show the CHEMO ALERT CARD at check-in to the Emergency Department and triage nurse.   

## 2017-12-15 ENCOUNTER — Ambulatory Visit
Admission: RE | Admit: 2017-12-15 | Discharge: 2017-12-15 | Disposition: A | Discharge status: Home / Self Care | Payer: BC Managed Care – PPO | Source: Ambulatory Visit | Attending: Radiation Oncology | Admitting: Radiation Oncology

## 2017-12-15 DIAGNOSIS — C3431 Malignant neoplasm of lower lobe, right bronchus or lung: Secondary | ICD-10-CM | POA: Diagnosis not present

## 2017-12-15 NOTE — Progress Notes (Deleted)
Office Visit Note  Patient: Susan Holder             Date of Birth: [birth year]           MRN: [id number]             PCP: Patient, No Pcp Per Referring: No ref. provider found Visit Date: 12/24/2017 Occupation: @GUAROCC @  Subjective:  No chief complaint on file.   History of Present Illness: Susan Holder is a 62 y.o. female ***   Activities of Daily Living:  Patient reports morning stiffness for *** {minute/hour:19697}.   Patient {ACTIONS;DENIES/REPORTS:21021675::"Denies"} nocturnal pain.  Difficulty dressing/grooming: {ACTIONS;DENIES/REPORTS:21021675::"Denies"} Difficulty climbing stairs: {ACTIONS;DENIES/REPORTS:21021675::"Denies"} Difficulty getting out of chair: {ACTIONS;DENIES/REPORTS:21021675::"Denies"} Difficulty using hands for taps, buttons, cutlery, and/or writing: {ACTIONS;DENIES/REPORTS:21021675::"Denies"}  No Rheumatology ROS completed.   PMFS History:  Patient Active Problem List   Diagnosis Date Noted  . Adenocarcinoma of right lung, stage 3 ([place]) 11/17/2017  . Encounter for antineoplastic chemotherapy 11/17/2017  . Goals of care, counseling/discussion 11/17/2017  . Lung mass   . Left hand pain 03/06/2017  . Rheumatoid arthritis involving multiple sites with positive rheumatoid factor ([place]) 05/29/2016  . ANA positive 05/29/2016  . Vitamin D deficiency 05/29/2016  . High risk medication use 05/29/2016  . Trigger finger, left ring finger 05/29/2016  . Trigger finger, right ring finger 05/29/2016  . DDD (degenerative disc disease), cervical 05/29/2016  . Smoker 05/29/2016  . Other fatigue 05/29/2016  . Allergic rhinitis 08/22/2013    Past Medical History:  Diagnosis Date  . Rheumatoid arthritis ([place])     Family History  Problem Relation Age of Onset  . Stroke Mother   . Alzheimer's disease Mother   . Heart disease Mother   . Emphysema Father   . Hypertension Brother   . Heart attack Maternal Aunt   . Heart failure Maternal Grandmother     . Hypertension Paternal Grandmother    Past Surgical History:  Procedure Laterality Date  . BACK SURGERY    . BRONCHIAL NEEDLE ASPIRATION BIOPSY  11/09/2017   Procedure: BRONCHIAL NEEDLE ASPIRATION BIOPSIES;  Surgeon: Marshell Garfinkel, MD;  Location: [facility];  Service: Cardiopulmonary;;  . ENDOBRONCHIAL ULTRASOUND Bilateral 11/09/2017   Procedure: ENDOBRONCHIAL ULTRASOUND;  Surgeon: Marshell Garfinkel, MD;  Location: [facility];  Service: Cardiopulmonary;  Laterality: Bilateral;  . NASAL SINUS SURGERY    . NASAL SINUS SURGERY    . TUBAL LIGATION    . VIDEO BRONCHOSCOPY  11/09/2017   Procedure: VIDEO BRONCHOSCOPY;  Surgeon: Marshell Garfinkel, MD;  Location: [facility];  Service: Cardiopulmonary;;   Social History   Social History Narrative  . Not on file    Objective: Vital Signs: LMP 04/28/2000    Physical Exam   Musculoskeletal Exam: ***  CDAI Exam: CDAI Score: Not documented Patient Global Assessment: Not documented; Provider Global Assessment: Not documented Swollen: Not documented; Tender: Not documented Joint Exam   Not documented   There is currently no information documented on the homunculus. Go to the Rheumatology activity and complete the homunculus joint exam.  Investigation: No additional findings.  Imaging: Mr Jeri Cos VE Contrast  Result Date: 11/24/2017 CLINICAL DATA:  62 year old female with non-small cell lung cancer. Staging. EXAM: MRI HEAD WITHOUT AND WITH CONTRAST TECHNIQUE: Multiplanar, multiecho pulse sequences of the brain and surrounding structures were obtained without and with intravenous contrast. CONTRAST:  58mL MULTIHANCE GADOBENATE DIMEGLUMINE 529 MG/ML IV SOLN COMPARISON:  PET-CT 10/06/2017. FINDINGS: Brain: No abnormal enhancement identified. No midline shift, mass effect,  or evidence of intracranial mass lesion. No dural thickening. Cerebral volume is within normal limits for age. No restricted diffusion to suggest acute infarction. No  ventriculomegaly, extra-axial collection or acute intracranial hemorrhage. Cervicomedullary junction and pituitary are within normal limits. Mild to moderate for age scattered cerebral white matter T2 and FLAIR hyperintense foci in a nonspecific configuration. Mostly subcortical white matter involvement. No cortical encephalomalacia or chronic cerebral blood products identified. The deep gray matter nuclei, brainstem, and cerebellum appear normal. Vascular: Major intracranial vascular flow voids are preserved. The major dural venous sinuses are enhancing and appear patent. Skull and upper cervical spine: Negative visible cervical spine and spinal cord. Visualized bone marrow signal is within normal limits. Sinuses/Orbits: Normal orbits soft tissues. Paranasal sinuses are clear. Other: Mastoid air cells are clear. Visible internal auditory structures appear normal. Scalp face soft tissues appear negative; both parotid ducts are somewhat prominent but the glands otherwise appear normal. IMPRESSION: 1.  No metastatic disease or acute intracranial abnormality. 2. Mild to moderate for age nonspecific cerebral white matter signal changes. Electronically Signed   By: Genevie Ann M.D.   On: 11/24/2017 07:54    Recent Labs: Lab Results  Component Value Date   WBC 5.8 12/14/2017   HGB 11.0 (L) 12/14/2017   PLT 332 12/14/2017   NA 139 12/14/2017   K 4.0 12/14/2017   CL 108 12/14/2017   CO2 24 12/14/2017   GLUCOSE 101 (H) 12/14/2017   BUN 9 12/14/2017   CREATININE 0.70 12/14/2017   BILITOT <0.2 (L) 12/14/2017   ALKPHOS 98 12/14/2017   AST 19 12/14/2017   ALT 14 12/14/2017   PROT 7.0 12/14/2017   ALBUMIN 3.3 (L) 12/14/2017   CALCIUM 9.4 12/14/2017   GFRAA >60 12/14/2017    Speciality Comments: PLQ eye exam: 04/29/2017 Normal. [facility]. Follow up in 12 months.  Procedures:  No procedures performed Allergies: Patient has no known allergies.   Assessment / Plan:     Visit Diagnoses: No diagnosis  found.   Orders: No orders of the defined types were placed in this encounter.  No orders of the defined types were placed in this encounter.   Face-to-face time spent with patient was *** minutes. Greater than 50% of time was spent in counseling and coordination of care.  Follow-Up Instructions: No follow-ups on file.   Earnestine Mealing, CMA  Note - This record has been created using Editor, commissioning.  Chart creation errors have been sought, but may not always  have been located. Such creation errors do not reflect on  the standard of medical care.

## 2017-12-16 ENCOUNTER — Ambulatory Visit
Admission: RE | Admit: 2017-12-16 | Discharge: 2017-12-16 | Disposition: A | Discharge status: Home / Self Care | Payer: BC Managed Care – PPO | Source: Ambulatory Visit | Attending: Radiation Oncology | Admitting: Radiation Oncology

## 2017-12-16 DIAGNOSIS — C3431 Malignant neoplasm of lower lobe, right bronchus or lung: Secondary | ICD-10-CM | POA: Diagnosis not present

## 2017-12-17 ENCOUNTER — Ambulatory Visit
Admission: RE | Admit: 2017-12-17 | Discharge: 2017-12-17 | Disposition: A | Discharge status: Home / Self Care | Payer: BC Managed Care – PPO | Source: Ambulatory Visit | Attending: Radiation Oncology | Admitting: Radiation Oncology

## 2017-12-17 DIAGNOSIS — C3431 Malignant neoplasm of lower lobe, right bronchus or lung: Secondary | ICD-10-CM | POA: Diagnosis not present

## 2017-12-18 ENCOUNTER — Ambulatory Visit
Admission: RE | Admit: 2017-12-18 | Discharge: 2017-12-18 | Disposition: A | Discharge status: Home / Self Care | Payer: BC Managed Care – PPO | Source: Ambulatory Visit | Attending: Radiation Oncology | Admitting: Radiation Oncology

## 2017-12-18 DIAGNOSIS — C3431 Malignant neoplasm of lower lobe, right bronchus or lung: Secondary | ICD-10-CM | POA: Diagnosis not present

## 2017-12-18 MED ORDER — SUCRALFATE 1 G PO TABS: 1.0000 g | ORAL_TABLET | Freq: Four times a day (QID) | ORAL | 2 refills | Status: DC

## 2017-12-21 ENCOUNTER — Encounter: Payer: Self-pay | Admitting: Oncology

## 2017-12-21 ENCOUNTER — Ambulatory Visit
Admission: RE | Admit: 2017-12-21 | Discharge: 2017-12-21 | Disposition: A | Discharge status: Home / Self Care | Payer: BC Managed Care – PPO | Source: Ambulatory Visit | Attending: Radiation Oncology | Admitting: Radiation Oncology

## 2017-12-21 ENCOUNTER — Inpatient Hospital Stay: Payer: BC Managed Care – PPO

## 2017-12-21 ENCOUNTER — Inpatient Hospital Stay: Payer: BC Managed Care – PPO | Admitting: Oncology

## 2017-12-21 VITALS — BP 136/80 | HR 103 | Temp 98.2°F | Resp 17 | Ht 60.0 in | Wt 128.9 lb

## 2017-12-21 VITALS — HR 102

## 2017-12-21 DIAGNOSIS — C3491 Malignant neoplasm of unspecified part of right bronchus or lung: Secondary | ICD-10-CM

## 2017-12-21 DIAGNOSIS — Z5111 Encounter for antineoplastic chemotherapy: Secondary | ICD-10-CM | POA: Diagnosis not present

## 2017-12-21 DIAGNOSIS — C3431 Malignant neoplasm of lower lobe, right bronchus or lung: Secondary | ICD-10-CM

## 2017-12-21 DIAGNOSIS — R1310 Dysphagia, unspecified: Secondary | ICD-10-CM

## 2017-12-21 HISTORY — DX: Malignant neoplasm of lower lobe, right bronchus or lung: C34.31

## 2017-12-21 LAB — CBC WITH DIFFERENTIAL (CANCER CENTER ONLY)
Basophils Absolute: 0 10*3/uL (ref 0.0–0.1)
Basophils Relative: 0 %
Eosinophils Absolute: 0 10*3/uL (ref 0.0–0.5)
Eosinophils Relative: 0 %
HCT: 35.1 % (ref 34.8–46.6)
Hemoglobin: 11.3 g/dL — ABNORMAL LOW (ref 11.6–15.9)
Lymphocytes Relative: 9 %
Lymphs Abs: 0.7 10*3/uL — ABNORMAL LOW (ref 0.9–3.3)
MCH: 30.1 pg (ref 25.1–34.0)
MCHC: 32.2 g/dL (ref 31.5–36.0)
MCV: 93.4 fL (ref 79.5–101.0)
Monocytes Absolute: 0.9 10*3/uL (ref 0.1–0.9)
Monocytes Relative: 11 %
Neutro Abs: 6.4 10*3/uL (ref 1.5–6.5)
Neutrophils Relative %: 80 %
Platelet Count: 347 10*3/uL (ref 145–400)
RBC: 3.76 MIL/uL (ref 3.70–5.45)
RDW: 14.1 % (ref 11.2–14.5)
WBC Count: 8 10*3/uL (ref 3.9–10.3)

## 2017-12-21 LAB — CMP (CANCER CENTER ONLY)
ALT: 12 U/L (ref 0–44)
AST: 20 U/L (ref 15–41)
Albumin: 3.7 g/dL (ref 3.5–5.0)
Alkaline Phosphatase: 95 U/L (ref 38–126)
Anion gap: 9 (ref 5–15)
BUN: 11 mg/dL (ref 8–23)
CO2: 23 mmol/L (ref 22–32)
Calcium: 10.2 mg/dL (ref 8.9–10.3)
Chloride: 106 mmol/L (ref 98–111)
Creatinine: 0.79 mg/dL (ref 0.44–1.00)
GFR, Est AFR Am: >60 mL/min (ref >60)
GFR, Estimated: >60 mL/min (ref >60)
Glucose, Bld: 95 mg/dL (ref 70–99)
Potassium: 4.1 mmol/L (ref 3.5–5.1)
Sodium: 138 mmol/L (ref 135–145)
Total Bilirubin: 0.3 mg/dL (ref 0.3–1.2)
Total Protein: 7.5 g/dL (ref 6.5–8.1)

## 2017-12-21 MED ORDER — FAMOTIDINE IN NACL 20-0.9 MG/50ML-% IV SOLN
INTRAVENOUS | Status: AC
  Filled 2017-12-21: qty 50

## 2017-12-21 MED ORDER — SODIUM CHLORIDE 0.9 % IV SOLN
180.8000 mg | Freq: Once | INTRAVENOUS | Status: AC
  Administered 2017-12-21: 180 mg via INTRAVENOUS
  Filled 2017-12-21: qty 18

## 2017-12-21 MED ORDER — FAMOTIDINE IN NACL 20-0.9 MG/50ML-% IV SOLN
20.0000 mg | Freq: Once | INTRAVENOUS | Status: AC
  Administered 2017-12-21: 20 mg via INTRAVENOUS

## 2017-12-21 MED ORDER — SODIUM CHLORIDE 0.9 % IV SOLN
Freq: Once | INTRAVENOUS | Status: AC
  Administered 2017-12-21: 10:00:00 via INTRAVENOUS
  Filled 2017-12-21: qty 250

## 2017-12-21 MED ORDER — DIPHENHYDRAMINE HCL 50 MG/ML IJ SOLN
INTRAMUSCULAR | Status: AC
  Filled 2017-12-21: qty 1

## 2017-12-21 MED ORDER — PALONOSETRON HCL INJECTION 0.25 MG/5ML
0.2500 mg | Freq: Once | INTRAVENOUS | Status: AC
  Administered 2017-12-21: 0.25 mg via INTRAVENOUS

## 2017-12-21 MED ORDER — SODIUM CHLORIDE 0.9 % IV SOLN
20.0000 mg | Freq: Once | INTRAVENOUS | Status: AC
  Administered 2017-12-21: 20 mg via INTRAVENOUS
  Filled 2017-12-21: qty 2

## 2017-12-21 MED ORDER — DIPHENHYDRAMINE HCL 50 MG/ML IJ SOLN
50.0000 mg | Freq: Once | INTRAMUSCULAR | Status: AC
  Administered 2017-12-21: 50 mg via INTRAVENOUS

## 2017-12-21 MED ORDER — SODIUM CHLORIDE 0.9 % IV SOLN
45.0000 mg/m2 | Freq: Once | INTRAVENOUS | Status: AC
  Administered 2017-12-21: 72 mg via INTRAVENOUS
  Filled 2017-12-21: qty 12

## 2017-12-21 MED ORDER — PALONOSETRON HCL INJECTION 0.25 MG/5ML
INTRAVENOUS | Status: AC
  Filled 2017-12-21: qty 5

## 2017-12-21 NOTE — Assessment & Plan Note (Addendum)
This is a very pleasant 62 year old African-American female recently diagnosed with a stage IIIa non-small cell lung cancer, adenocarcinoma.  She is currently undergoing a course of concurrent chemoradiation with weekly carboplatin and paclitaxel status post 3 cycles.  She is tolerating her treatment well with no concerning complaints except for odynophagia. Recommend that she continue Carafate.  I have advised her to stop Pepcid and to start taking Protonix.  I have advised her to follow-up with radiation oncology after she leaves this appointment today to discuss further management of her pain secondary to radiation. The patient will proceed with chemotherapy today as scheduled.  She will follow-up in 2 weeks for evaluation prior to cycle #6.  She was advised to call immediately if she has any concerning symptoms in the interval. The patient voices understanding of current disease status and treatment options and is in agreement with the current care plan.  All questions were answered. The patient knows to call the clinic with any problems, questions or concerns. We can certainly see the patient much sooner if necessary.

## 2017-12-21 NOTE — Progress Notes (Signed)
[place] OFFICE PROGRESS NOTE  Patient, No Pcp Per No address on file  DIAGNOSIS: Stage IIIA (T3,N1/N2, M0)non-small cell lung cancer, adenocarcinoma presented with large right lower lobe lung mass with extension to the right hilum and subcarinal area diagnosed in July 2019.  Biomarker Findings Tumor Mutational Burden - TMB-Intermediate (6 Muts/Mb) Microsatellite status - MS-Stable Genomic Findings For a complete list of the genes assayed, please refer to the Appendix. NRAS Q61R ARAF amplification STK11 G56W KRAS G13D MYCN amplification MCL1 amplification NKX2-1 amplification - equivocal? TP53 G245V 7 Disease relevant genes with no reportable alterations: EGFR, ALK, BRAF, MET, ERBB2, RET, ROS1   PRIOR THERAPY: None.  CURRENT THERAPY: Course of concurrent chemoradiation with weekly carboplatin for AUC of 2 and paclitaxel 45 mg/M2.    First dose given on 11/30/2017.  Status post 3 cycles.  INTERVAL HISTORY: Susan Holder 62 y.o. female returns for a routine follow-up visit by herself.  The patient reports that she is having increased pain with eating and swallowing.  This worsened significantly over the weekend.  She is already on Carafate reports that she takes Pepcid.  She has prescription for tonics on her medication list but she is not sure she is taking this or not.  She is still able to eat.  She denies fevers and chills.  Denies chest pain, shortness of breath, cough, hemoptysis.  Denies nausea, vomiting, constipation, diarrhea.  Denies recent weight loss or night sweats.  The patient is here for evaluation prior to cycle #4 of her chemotherapy.  MEDICAL HISTORY: Past Medical History:  Diagnosis Date  . Rheumatoid arthritis ([place])     ALLERGIES:  has No Known Allergies.  MEDICATIONS:  Current Outpatient Medications  Medication Sig Dispense Refill  . albuterol (PROVENTIL HFA) 108 (90 Base) MCG/ACT inhaler Inhale 1 puff into the lungs every 6 (six)  hours as needed for wheezing or shortness of breath.     Marland Kitchen aspirin EC 81 MG tablet Take 81 mg by mouth daily.    . cetirizine (ZYRTEC) 10 MG tablet Take by mouth.    . famotidine (PEPCID) 40 MG tablet Take 40 mg by mouth daily.    . hydroxychloroquine (PLAQUENIL) 200 MG tablet Take 200 mg po BID Monday Friday (Patient taking differently: Take 200 mg po BID Monday- Friday) 60 tablet 2  . naproxen sodium (ALEVE) 220 MG tablet Take 220 mg by mouth daily as needed (PAIN).    Marland Kitchen pantoprazole (PROTONIX) 40 MG tablet Take 1 tablet (40 mg total) by mouth at bedtime. 30 tablet 1  . predniSONE (STERAPRED UNI-PAK 48 TAB) 10 MG (48) TBPK tablet Take by mouth.    . Probiotic Product (ALIGN PO) Take 1 capsule by mouth at bedtime.    . prochlorperazine (COMPAZINE) 10 MG tablet Take 1 tablet (10 mg total) by mouth every 6 (six) hours as needed for nausea or vomiting. 30 tablet 0  . pseudoephedrine-guaifenesin (MUCINEX D) 60-600 MG 12 hr tablet Take 1 tablet by mouth every 12 (twelve) hours.    . sucralfate (CARAFATE) 1 g tablet Take 1 tablet (1 g total) by mouth 4 (four) times daily. 120 tablet 2  . Vitamin D, Ergocalciferol, (DRISDOL) 50000 units CAPS capsule Take 1 capsule (50,000 Units total) by mouth every 30 (thirty) days. 3 capsule 1   No current facility-administered medications for this visit.    Facility-Administered Medications Ordered in Other Visits  Medication Dose Route Frequency Provider Last Rate Last Dose  . CARBOplatin (PARAPLATIN)  180 mg in sodium chloride 0.9 % 250 mL chemo infusion  180 mg Intravenous Once Curt Bears, MD 536 mL/hr at 12/21/17 1237 180 mg at 12/21/17 1237    SURGICAL HISTORY:  Past Surgical History:  Procedure Laterality Date  . BACK SURGERY    . BRONCHIAL NEEDLE ASPIRATION BIOPSY  11/09/2017   Procedure: BRONCHIAL NEEDLE ASPIRATION BIOPSIES;  Surgeon: Marshell Garfinkel, MD;  Location: [facility];  Service: Cardiopulmonary;;  . ENDOBRONCHIAL ULTRASOUND Bilateral  11/09/2017   Procedure: ENDOBRONCHIAL ULTRASOUND;  Surgeon: Marshell Garfinkel, MD;  Location: [facility];  Service: Cardiopulmonary;  Laterality: Bilateral;  . NASAL SINUS SURGERY    . NASAL SINUS SURGERY    . TUBAL LIGATION    . VIDEO BRONCHOSCOPY  11/09/2017   Procedure: VIDEO BRONCHOSCOPY;  Surgeon: Marshell Garfinkel, MD;  Location: [facility];  Service: Cardiopulmonary;;    REVIEW OF SYSTEMS:   Review of Systems  Constitutional: Negative for appetite change, chills, fatigue, fever and unexpected weight change.  HENT:   Negative for mouth sores, nosebleeds.  Positive for odynophagia. Eyes: Negative for eye problems and icterus.  Respiratory: Negative for cough, hemoptysis, shortness of breath and wheezing.   Cardiovascular: Negative for chest pain and leg swelling.  Gastrointestinal: Negative for abdominal pain, constipation, diarrhea, nausea and vomiting.  Genitourinary: Negative for bladder incontinence, difficulty urinating, dysuria, frequency and hematuria.   Musculoskeletal: Negative for back pain, gait problem, neck pain and neck stiffness.  Skin: Negative for itching and rash.  Neurological: Negative for dizziness, extremity weakness, gait problem, headaches, light-headedness and seizures.  Hematological: Negative for adenopathy. Does not bruise/bleed easily.  Psychiatric/Behavioral: Negative for confusion, depression and sleep disturbance. The patient is not nervous/anxious.     PHYSICAL EXAMINATION:  Blood pressure 136/80, pulse (!) 103, temperature 98.2 F (36.8 C), temperature source Oral, resp. rate 17, height 5' (1.524 m), weight 128 lb 14.4 oz (58.5 kg), last menstrual period 04/28/2000, SpO2 98 %.  ECOG PERFORMANCE STATUS: 1 - Symptomatic but completely ambulatory  Physical Exam  Constitutional: Oriented to person, place, and time and well-developed, well-nourished, and in no distress. No distress.  HENT:  Head: Normocephalic and atraumatic.  Mouth/Throat:  Oropharynx is clear and moist. No oropharyngeal exudate.  Eyes: Conjunctivae are normal. Right eye exhibits no discharge. Left eye exhibits no discharge. No scleral icterus.  Neck: Normal range of motion. Neck supple.  Cardiovascular: Normal rate, regular rhythm, normal heart sounds and intact distal pulses.   Pulmonary/Chest: Effort normal and breath sounds normal. No respiratory distress. No wheezes. No rales.  Abdominal: Soft. Bowel sounds are normal. Exhibits no distension and no mass. There is no tenderness.  Musculoskeletal: Normal range of motion. Exhibits no edema.  Lymphadenopathy:    No cervical adenopathy.  Neurological: Alert and oriented to person, place, and time. Exhibits normal muscle tone. Gait normal. Coordination normal.  Skin: Skin is warm and dry. No rash noted. Not diaphoretic. No erythema. No pallor.  Psychiatric: Mood, memory and judgment normal.  Vitals reviewed.  LABORATORY DATA: Lab Results  Component Value Date   WBC 8.0 12/21/2017   HGB 11.3 (L) 12/21/2017   HCT 35.1 12/21/2017   MCV 93.4 12/21/2017   PLT 347 12/21/2017      Chemistry      Component Value Date/Time   NA 138 12/21/2017 0810   K 4.1 12/21/2017 0810   CL 106 12/21/2017 0810   CO2 23 12/21/2017 0810   BUN 11 12/21/2017 0810   CREATININE 0.79 12/21/2017 0810  CREATININE 0.80 07/24/2017 1438      Component Value Date/Time   CALCIUM 10.2 12/21/2017 0810   ALKPHOS 95 12/21/2017 0810   AST 20 12/21/2017 0810   ALT 12 12/21/2017 0810   BILITOT 0.3 12/21/2017 0810       RADIOGRAPHIC STUDIES:  Mr Jeri Cos BM Contrast  Result Date: 11/24/2017 CLINICAL DATA:  62 year old female with non-small cell lung cancer. Staging. EXAM: MRI HEAD WITHOUT AND WITH CONTRAST TECHNIQUE: Multiplanar, multiecho pulse sequences of the brain and surrounding structures were obtained without and with intravenous contrast. CONTRAST:  2m MULTIHANCE GADOBENATE DIMEGLUMINE 529 MG/ML IV SOLN COMPARISON:  PET-CT  10/06/2017. FINDINGS: Brain: No abnormal enhancement identified. No midline shift, mass effect, or evidence of intracranial mass lesion. No dural thickening. Cerebral volume is within normal limits for age. No restricted diffusion to suggest acute infarction. No ventriculomegaly, extra-axial collection or acute intracranial hemorrhage. Cervicomedullary junction and pituitary are within normal limits. Mild to moderate for age scattered cerebral white matter T2 and FLAIR hyperintense foci in a nonspecific configuration. Mostly subcortical white matter involvement. No cortical encephalomalacia or chronic cerebral blood products identified. The deep gray matter nuclei, brainstem, and cerebellum appear normal. Vascular: Major intracranial vascular flow voids are preserved. The major dural venous sinuses are enhancing and appear patent. Skull and upper cervical spine: Negative visible cervical spine and spinal cord. Visualized bone marrow signal is within normal limits. Sinuses/Orbits: Normal orbits soft tissues. Paranasal sinuses are clear. Other: Mastoid air cells are clear. Visible internal auditory structures appear normal. Scalp face soft tissues appear negative; both parotid ducts are somewhat prominent but the glands otherwise appear normal. IMPRESSION: 1.  No metastatic disease or acute intracranial abnormality. 2. Mild to moderate for age nonspecific cerebral white matter signal changes. Electronically Signed   By: HGenevie AnnM.D.   On: 11/24/2017 07:54     ASSESSMENT/PLAN:  Adenocarcinoma of right lung, stage 3 (HCC) This is a very pleasant 62year old African-American female recently diagnosed with a stage IIIa non-small cell lung cancer, adenocarcinoma.  She is currently undergoing a course of concurrent chemoradiation with weekly carboplatin and paclitaxel status post 3 cycles.  She is tolerating her treatment well with no concerning complaints except for odynophagia. Recommend that she continue Carafate.   I have advised her to stop Pepcid and to start taking Protonix.  I have advised her to follow-up with radiation oncology after she leaves this appointment today to discuss further management of her pain secondary to radiation. The patient will proceed with chemotherapy today as scheduled.  She will follow-up in 2 weeks for evaluation prior to cycle #6.  She was advised to call immediately if she has any concerning symptoms in the interval. The patient voices understanding of current disease status and treatment options and is in agreement with the current care plan.  All questions were answered. The patient knows to call the clinic with any problems, questions or concerns. We can certainly see the patient much sooner if necessary.   No orders of the defined types were placed in this encounter.    KMikey Bussing DNP, AGPCNP-BC, AOCNP 12/21/17

## 2017-12-21 NOTE — Progress Notes (Signed)
  Radiation Oncology         [phone] ________________________________  Name: Susan Holder MRN: [id number]  Date: 11/19/2017  DOB: 02/06/1956  SIMULATION AND TREATMENT PLANNING NOTE  DIAGNOSIS:     ICD-10-CM   1. Primary malignant neoplasm of bronchus of right lower lobe [facility]) C34.31      Site:  chest  NARRATIVE:  The patient was brought to the [place] [facility].  Identity was confirmed.  All relevant records and images related to the planned course of therapy were reviewed.   Written consent to proceed with treatment was confirmed which was freely given after reviewing the details related to the planned course of therapy had been reviewed with the patient.  Then, the patient was set-up in a stable reproducible  supine position for radiation therapy.  CT images were obtained.  Surface markings were placed.    Medically necessary complex treatment device(s) for immobilization:  Vac-lock bag.   The CT images were loaded into the planning software.  Then the target and avoidance structures were contoured.  Treatment planning then occurred.  The radiation prescription was entered and confirmed.  A total of 4 complex treatment devices were fabricated which relate to the designed radiation treatment fields. Additional reduced fields will be used as necessary to improve the dose homogeneity of the plan. Each of these customized fields/ complex treatment devices will be used on a daily basis during the radiation course. I have requested : 3D Simulation  I have requested a DVH of the following structures: target volume, spinal cord, lungs, heart.   The patient will undergo daily image guidance to ensure accurate localization of the target, and adequate minimize dose to the normal surrounding structures in close proximity to the target.  PLAN:  The patient will receive 60 Gy in 30 fractions initially. The patient will then receive a 6 Gy boost for a final dose of 66 Gy.  Special  treatment procedure The patient will also receive concurrent chemotherapy during the treatment. The patient may therefore experience increased toxicity or side effects and the patient will be monitored for such problems. This may require extra lab work as necessary. This therefore constitutes a special treatment procedure.   ________________________________   Jodelle Gross, MD, PhD

## 2017-12-21 NOTE — Patient Instructions (Signed)
[facility] Discharge Instructions for Patients Receiving Chemotherapy  Today you received the following chemotherapy agents: Paclitaxel (Taxol) and Carboplatin (Paraplatin)  To help prevent nausea and vomiting after your treatment, we encourage you to take your nausea medication as prescribed. Received Aloxi during treatment today-->Take Compazine (not Zofran) for the next 3 days as needed.   If you develop nausea and vomiting that is not controlled by your nausea medication, call the clinic.   BELOW ARE SYMPTOMS THAT SHOULD BE REPORTED IMMEDIATELY:  *FEVER GREATER THAN 100.5 F  *CHILLS WITH OR WITHOUT FEVER  NAUSEA AND VOMITING THAT IS NOT CONTROLLED WITH YOUR NAUSEA MEDICATION  *UNUSUAL SHORTNESS OF BREATH  *UNUSUAL BRUISING OR BLEEDING  TENDERNESS IN MOUTH AND THROAT WITH OR WITHOUT PRESENCE OF ULCERS  *URINARY PROBLEMS  *BOWEL PROBLEMS  UNUSUAL RASH Items with * indicate a potential emergency and should be followed up as soon as possible.  Feel free to call the clinic should you have any questions or concerns. The clinic phone number is [phone].  Please show the CHEMO ALERT CARD at check-in to the Emergency Department and triage nurse.   

## 2017-12-21 NOTE — Progress Notes (Signed)
Per Dr. Julien Nordmann, [facility] to tx with HR >100 today.

## 2017-12-21 NOTE — Progress Notes (Signed)
  Radiation Oncology         [phone]) [phone] ________________________________  Name: Susan Holder MRN: [id number]  Date: 11/19/2017  DOB: 02/14/1956  RESPIRATORY MOTION MANAGEMENT SIMULATION  NARRATIVE:  In order to account for effect of respiratory motion on target structures and other organs in the planning and delivery of radiotherapy, this patient underwent respiratory motion management simulation.  To accomplish this, when the patient was brought to the CT simulation planning suite, 4D respiratoy motion management CT images were obtained.  The CT images were loaded into the planning software.  Then, using a variety of tools including Cine, MIP, and standard views, the target volume and planning target volumes (PTV) were delineated.  Avoidance structures were contoured.  Treatment planning then occurred.  Dose volume histograms were generated and reviewed for each of the requested structure.  The resulting plan was carefully reviewed and approved today.   ------------------------------------------------  Jodelle Gross, MD, PhD

## 2017-12-22 ENCOUNTER — Ambulatory Visit
Admission: RE | Admit: 2017-12-22 | Discharge: 2017-12-22 | Disposition: A | Discharge status: Home / Self Care | Payer: BC Managed Care – PPO | Source: Ambulatory Visit | Attending: Radiation Oncology | Admitting: Radiation Oncology

## 2017-12-22 ENCOUNTER — Telehealth: Payer: Self-pay | Admitting: Oncology

## 2017-12-22 DIAGNOSIS — C3431 Malignant neoplasm of lower lobe, right bronchus or lung: Secondary | ICD-10-CM | POA: Diagnosis not present

## 2017-12-22 NOTE — Telephone Encounter (Signed)
appts already scheduled per 8/26 los.

## 2017-12-23 ENCOUNTER — Other Ambulatory Visit: Payer: Self-pay | Admitting: Radiation Oncology

## 2017-12-23 ENCOUNTER — Ambulatory Visit
Admission: RE | Admit: 2017-12-23 | Discharge: 2017-12-23 | Disposition: A | Discharge status: Home / Self Care | Payer: BC Managed Care – PPO | Source: Ambulatory Visit | Attending: Radiation Oncology | Admitting: Radiation Oncology

## 2017-12-23 DIAGNOSIS — C3431 Malignant neoplasm of lower lobe, right bronchus or lung: Secondary | ICD-10-CM | POA: Diagnosis not present

## 2017-12-23 MED ORDER — HYDROCODONE-ACETAMINOPHEN 7.5-325 MG/15ML PO SOLN: 10.0000 mL | Freq: Four times a day (QID) | ORAL | 0 refills | Status: DC | PRN

## 2017-12-23 NOTE — Progress Notes (Signed)
Patient is 18 out of 30 fractions and her course of radiation to the right lower lobe lesion.  She has been given Carafate for esophagitis last week, but continues to have difficulty with swallowing and pain in the upper part of her chest when she eats.  She states that Carafate worked for several days, however it has stopped working.  Nursing assessment reveals normal vital signs, she is afebrile.  We discussed the use of high set, and this was called into her pharmacy.

## 2017-12-23 NOTE — Progress Notes (Signed)
Patient was brought around from the treatment machine with complaints of chest pain and swallowing difficulties and pain.  She denies chest tightness, pressure, and pain that radiates.  Vital signs stable.  It is determined that the pain is in relation to her radiation treatments.  She states she has been taking extra strength tylenol and carafate with little to no relief.  She was prescribed hycet and encouraged to get some over the counter mylanta as well as continue use of the carafate.  She was told to refrain from driving while taking the hycet.  [place] told to follow up with [place] later in the week if she was still having difficulties.  Will continue to follow as necessary.  Gloriajean Dell. Leonie Green, BSN

## 2017-12-24 ENCOUNTER — Other Ambulatory Visit: Payer: Self-pay | Admitting: Radiation Oncology

## 2017-12-24 ENCOUNTER — Encounter: Payer: Self-pay | Admitting: Rheumatology

## 2017-12-24 ENCOUNTER — Ambulatory Visit
Admission: RE | Admit: 2017-12-24 | Discharge: 2017-12-24 | Disposition: A | Discharge status: Home / Self Care | Payer: BC Managed Care – PPO | Source: Ambulatory Visit | Attending: Radiation Oncology | Admitting: Radiation Oncology

## 2017-12-24 DIAGNOSIS — C3431 Malignant neoplasm of lower lobe, right bronchus or lung: Secondary | ICD-10-CM | POA: Diagnosis not present

## 2017-12-24 MED ORDER — LIDOCAINE VISCOUS HCL 2 % MT SOLN: 10.0000 mL | Freq: Four times a day (QID) | OROMUCOSAL | 2 refills | Status: DC | PRN

## 2017-12-24 MED ORDER — ONDANSETRON HCL 4 MG PO TABS
8.0000 mg | ORAL_TABLET | Freq: Once | ORAL | Status: AC
  Administered 2017-12-24: 8 mg via ORAL
  Filled 2017-12-24: qty 2

## 2017-12-24 NOTE — Progress Notes (Signed)
I spoke with the patient today. Yesterday I gave her a prescription for hycet 7.5/15 mL, and she took 5 mL of the medication this am and has been feeling nauseated, wiped out, and sedated. Her vitals were stable and she was given a dose of Zofran 8 mg in the office po to help her nausea. We discussed that we should try to avoid narcotic medication given her response to the medication. Instead, we will try viscous lidocaine and this was erx'd to her pharmacy.     Carola Rhine, PAC

## 2017-12-25 ENCOUNTER — Ambulatory Visit
Admission: RE | Admit: 2017-12-25 | Discharge: 2017-12-25 | Disposition: A | Discharge status: Home / Self Care | Payer: BC Managed Care – PPO | Source: Ambulatory Visit | Attending: Radiation Oncology | Admitting: Radiation Oncology

## 2017-12-25 ENCOUNTER — Other Ambulatory Visit: Payer: Self-pay | Admitting: Radiation Oncology

## 2017-12-25 DIAGNOSIS — C3431 Malignant neoplasm of lower lobe, right bronchus or lung: Secondary | ICD-10-CM | POA: Diagnosis not present

## 2017-12-25 MED ORDER — TRAMADOL HCL 50 MG PO TABS: 50.0000 mg | ORAL_TABLET | Freq: Four times a day (QID) | ORAL | 0 refills | Status: DC | PRN

## 2017-12-29 ENCOUNTER — Ambulatory Visit
Admission: RE | Admit: 2017-12-29 | Discharge: 2017-12-29 | Disposition: A | Discharge status: Home / Self Care | Payer: BC Managed Care – PPO | Source: Ambulatory Visit | Attending: Radiation Oncology | Admitting: Radiation Oncology

## 2017-12-29 ENCOUNTER — Inpatient Hospital Stay: Payer: BC Managed Care – PPO | Attending: Internal Medicine

## 2017-12-29 ENCOUNTER — Other Ambulatory Visit: Payer: Self-pay | Admitting: Rheumatology

## 2017-12-29 ENCOUNTER — Inpatient Hospital Stay: Payer: BC Managed Care – PPO

## 2017-12-29 VITALS — BP 126/70 | HR 100 | Temp 98.4°F | Wt 125.0 lb

## 2017-12-29 DIAGNOSIS — Z5111 Encounter for antineoplastic chemotherapy: Secondary | ICD-10-CM | POA: Insufficient documentation

## 2017-12-29 DIAGNOSIS — C3431 Malignant neoplasm of lower lobe, right bronchus or lung: Secondary | ICD-10-CM | POA: Diagnosis not present

## 2017-12-29 DIAGNOSIS — C3491 Malignant neoplasm of unspecified part of right bronchus or lung: Secondary | ICD-10-CM

## 2017-12-29 DIAGNOSIS — Z510 Encounter for antineoplastic radiation therapy: Secondary | ICD-10-CM | POA: Diagnosis not present

## 2017-12-29 LAB — CBC WITH DIFFERENTIAL (CANCER CENTER ONLY)
Basophils Absolute: 0 10*3/uL (ref 0.0–0.1)
Basophils Relative: 1 %
Eosinophils Absolute: 0 10*3/uL (ref 0.0–0.5)
Eosinophils Relative: 0 %
HCT: 33.1 % — ABNORMAL LOW (ref 34.8–46.6)
Hemoglobin: 11.4 g/dL — ABNORMAL LOW (ref 11.6–15.9)
Lymphocytes Relative: 8 %
Lymphs Abs: 0.4 10*3/uL — ABNORMAL LOW (ref 0.9–3.3)
MCH: 31.4 pg (ref 25.1–34.0)
MCHC: 34.3 g/dL (ref 31.5–36.0)
MCV: 91.6 fL (ref 79.5–101.0)
Monocytes Absolute: 0.6 10*3/uL (ref 0.1–0.9)
Monocytes Relative: 12 %
Neutro Abs: 4.1 10*3/uL (ref 1.5–6.5)
Neutrophils Relative %: 79 %
Platelet Count: 263 10*3/uL (ref 145–400)
RBC: 3.62 MIL/uL — ABNORMAL LOW (ref 3.70–5.45)
RDW: 14.7 % — ABNORMAL HIGH (ref 11.2–14.5)
WBC Count: 5.2 10*3/uL (ref 3.9–10.3)

## 2017-12-29 LAB — CMP (CANCER CENTER ONLY)
ALT: 21 U/L (ref 0–44)
AST: 27 U/L (ref 15–41)
Albumin: 3.4 g/dL — ABNORMAL LOW (ref 3.5–5.0)
Alkaline Phosphatase: 96 U/L (ref 38–126)
Anion gap: 9 (ref 5–15)
BUN: 7 mg/dL — ABNORMAL LOW (ref 8–23)
CO2: 25 mmol/L (ref 22–32)
Calcium: 9.6 mg/dL (ref 8.9–10.3)
Chloride: 102 mmol/L (ref 98–111)
Creatinine: 0.71 mg/dL (ref 0.44–1.00)
GFR, Est AFR Am: >60 mL/min (ref >60)
GFR, Estimated: >60 mL/min (ref >60)
Glucose, Bld: 98 mg/dL (ref 70–99)
Potassium: 3.4 mmol/L — ABNORMAL LOW (ref 3.5–5.1)
Sodium: 136 mmol/L (ref 135–145)
Total Bilirubin: 0.3 mg/dL (ref 0.3–1.2)
Total Protein: 7 g/dL (ref 6.5–8.1)

## 2017-12-29 MED ORDER — PALONOSETRON HCL INJECTION 0.25 MG/5ML
INTRAVENOUS | Status: AC
  Filled 2017-12-29: qty 5

## 2017-12-29 MED ORDER — PALONOSETRON HCL INJECTION 0.25 MG/5ML
0.2500 mg | Freq: Once | INTRAVENOUS | Status: AC
  Administered 2017-12-29: 0.25 mg via INTRAVENOUS

## 2017-12-29 MED ORDER — SODIUM CHLORIDE 0.9 % IV SOLN
45.0000 mg/m2 | Freq: Once | INTRAVENOUS | Status: AC
  Administered 2017-12-29: 72 mg via INTRAVENOUS
  Filled 2017-12-29: qty 12

## 2017-12-29 MED ORDER — DIPHENHYDRAMINE HCL 50 MG/ML IJ SOLN
INTRAMUSCULAR | Status: AC
  Filled 2017-12-29: qty 1

## 2017-12-29 MED ORDER — DIPHENHYDRAMINE HCL 50 MG/ML IJ SOLN
50.0000 mg | Freq: Once | INTRAMUSCULAR | Status: AC
  Administered 2017-12-29: 50 mg via INTRAVENOUS

## 2017-12-29 MED ORDER — SODIUM CHLORIDE 0.9 % IV SOLN
180.8000 mg | Freq: Once | INTRAVENOUS | Status: AC
  Administered 2017-12-29: 180 mg via INTRAVENOUS
  Filled 2017-12-29: qty 18

## 2017-12-29 MED ORDER — FAMOTIDINE IN NACL 20-0.9 MG/50ML-% IV SOLN
INTRAVENOUS | Status: AC
  Filled 2017-12-29: qty 50

## 2017-12-29 MED ORDER — SODIUM CHLORIDE 0.9 % IV SOLN
Freq: Once | INTRAVENOUS | Status: AC
  Administered 2017-12-29: 10:00:00 via INTRAVENOUS
  Filled 2017-12-29: qty 250

## 2017-12-29 MED ORDER — FAMOTIDINE IN NACL 20-0.9 MG/50ML-% IV SOLN
20.0000 mg | Freq: Once | INTRAVENOUS | Status: AC
  Administered 2017-12-29: 20 mg via INTRAVENOUS

## 2017-12-29 MED ORDER — SODIUM CHLORIDE 0.9 % IV SOLN
20.0000 mg | Freq: Once | INTRAVENOUS | Status: AC
  Administered 2017-12-29: 20 mg via INTRAVENOUS
  Filled 2017-12-29: qty 2

## 2017-12-29 NOTE — Progress Notes (Signed)
This encounter was created in error - please disregard.

## 2017-12-29 NOTE — Patient Instructions (Signed)
[facility] Discharge Instructions for Patients Receiving Chemotherapy  Today you received the following chemotherapy agents: Paclitaxel (Taxol) and Carboplatin (Paraplatin)  To help prevent nausea and vomiting after your treatment, we encourage you to take your nausea medication as directed.    If you develop nausea and vomiting that is not controlled by your nausea medication, call the clinic.   BELOW ARE SYMPTOMS THAT SHOULD BE REPORTED IMMEDIATELY:  *FEVER GREATER THAN 100.5 F  *CHILLS WITH OR WITHOUT FEVER  NAUSEA AND VOMITING THAT IS NOT CONTROLLED WITH YOUR NAUSEA MEDICATION  *UNUSUAL SHORTNESS OF BREATH  *UNUSUAL BRUISING OR BLEEDING  TENDERNESS IN MOUTH AND THROAT WITH OR WITHOUT PRESENCE OF ULCERS  *URINARY PROBLEMS  *BOWEL PROBLEMS  UNUSUAL RASH Items with * indicate a potential emergency and should be followed up as soon as possible.  Feel free to call the clinic should you have any questions or concerns. The clinic phone number is [phone].  Please show the [place] at check-in to the Emergency Department and triage nurse.

## 2017-12-30 ENCOUNTER — Ambulatory Visit
Admission: RE | Admit: 2017-12-30 | Discharge: 2017-12-30 | Disposition: A | Discharge status: Home / Self Care | Payer: BC Managed Care – PPO | Source: Ambulatory Visit | Attending: Radiation Oncology | Admitting: Radiation Oncology

## 2017-12-30 DIAGNOSIS — C3431 Malignant neoplasm of lower lobe, right bronchus or lung: Secondary | ICD-10-CM | POA: Diagnosis not present

## 2017-12-30 NOTE — Telephone Encounter (Signed)
Last visit: 07/24/2017 Next visit: message sent to the front desk to schedule patient.  Labs: 12/29/2017  Eye exam: 04/29/2017   Okay to refill PLQ?

## 2017-12-30 NOTE — Telephone Encounter (Signed)
Ok to refill 

## 2017-12-31 ENCOUNTER — Ambulatory Visit
Admission: RE | Admit: 2017-12-31 | Discharge: 2017-12-31 | Disposition: A | Discharge status: Home / Self Care | Payer: BC Managed Care – PPO | Source: Ambulatory Visit | Attending: Radiation Oncology | Admitting: Radiation Oncology

## 2017-12-31 DIAGNOSIS — C3431 Malignant neoplasm of lower lobe, right bronchus or lung: Secondary | ICD-10-CM | POA: Diagnosis not present

## 2018-01-01 ENCOUNTER — Ambulatory Visit
Admission: RE | Admit: 2018-01-01 | Discharge: 2018-01-01 | Disposition: A | Discharge status: Home / Self Care | Payer: BC Managed Care – PPO | Source: Ambulatory Visit | Attending: Radiation Oncology | Admitting: Radiation Oncology

## 2018-01-01 ENCOUNTER — Encounter: Payer: Self-pay | Admitting: *Deleted

## 2018-01-01 ENCOUNTER — Other Ambulatory Visit: Payer: Self-pay | Admitting: Radiation Oncology

## 2018-01-01 DIAGNOSIS — C3431 Malignant neoplasm of lower lobe, right bronchus or lung: Secondary | ICD-10-CM | POA: Diagnosis not present

## 2018-01-04 ENCOUNTER — Inpatient Hospital Stay: Payer: BC Managed Care – PPO

## 2018-01-04 ENCOUNTER — Encounter: Payer: Self-pay | Admitting: *Deleted

## 2018-01-04 ENCOUNTER — Encounter: Payer: Self-pay | Admitting: Internal Medicine

## 2018-01-04 ENCOUNTER — Telehealth: Payer: Self-pay | Admitting: Internal Medicine

## 2018-01-04 ENCOUNTER — Other Ambulatory Visit: Payer: Self-pay | Admitting: *Deleted

## 2018-01-04 ENCOUNTER — Telehealth: Payer: Self-pay | Admitting: Rheumatology

## 2018-01-04 ENCOUNTER — Ambulatory Visit
Admission: RE | Admit: 2018-01-04 | Discharge: 2018-01-04 | Disposition: A | Discharge status: Home / Self Care | Payer: BC Managed Care – PPO | Source: Ambulatory Visit | Attending: Radiation Oncology | Admitting: Radiation Oncology

## 2018-01-04 ENCOUNTER — Inpatient Hospital Stay (HOSPITAL_BASED_OUTPATIENT_CLINIC_OR_DEPARTMENT_OTHER): Payer: BC Managed Care – PPO | Admitting: Internal Medicine

## 2018-01-04 VITALS — BP 129/85 | HR 107 | Temp 98.1°F | Resp 17 | Ht 60.0 in | Wt 122.6 lb

## 2018-01-04 DIAGNOSIS — Z5111 Encounter for antineoplastic chemotherapy: Secondary | ICD-10-CM | POA: Diagnosis not present

## 2018-01-04 DIAGNOSIS — C3491 Malignant neoplasm of unspecified part of right bronchus or lung: Secondary | ICD-10-CM

## 2018-01-04 DIAGNOSIS — C3490 Malignant neoplasm of unspecified part of unspecified bronchus or lung: Secondary | ICD-10-CM

## 2018-01-04 DIAGNOSIS — C3431 Malignant neoplasm of lower lobe, right bronchus or lung: Secondary | ICD-10-CM

## 2018-01-04 LAB — CBC WITH DIFFERENTIAL (CANCER CENTER ONLY)
Basophils Absolute: 0 10*3/uL (ref 0.0–0.1)
Basophils Relative: 0 %
Eosinophils Absolute: 0 10*3/uL (ref 0.0–0.5)
Eosinophils Relative: 0 %
HCT: 34.8 % (ref 34.8–46.6)
Hemoglobin: 11.4 g/dL — ABNORMAL LOW (ref 11.6–15.9)
Lymphocytes Relative: 9 %
Lymphs Abs: 0.3 10*3/uL — ABNORMAL LOW (ref 0.9–3.3)
MCH: 30.4 pg (ref 25.1–34.0)
MCHC: 32.9 g/dL (ref 31.5–36.0)
MCV: 92.4 fL (ref 79.5–101.0)
Monocytes Absolute: 0.4 10*3/uL (ref 0.1–0.9)
Monocytes Relative: 12 %
Neutro Abs: 2.7 10*3/uL (ref 1.5–6.5)
Neutrophils Relative %: 79 %
Platelet Count: 293 10*3/uL (ref 145–400)
RBC: 3.77 MIL/uL (ref 3.70–5.45)
RDW: 15.2 % — ABNORMAL HIGH (ref 11.2–14.5)
WBC Count: 3.5 10*3/uL — ABNORMAL LOW (ref 3.9–10.3)

## 2018-01-04 MED ORDER — SODIUM CHLORIDE 0.9 % IV SOLN
45.0000 mg/m2 | Freq: Once | INTRAVENOUS | Status: AC
  Administered 2018-01-04: 72 mg via INTRAVENOUS
  Filled 2018-01-04: qty 12

## 2018-01-04 MED ORDER — DIPHENHYDRAMINE HCL 50 MG/ML IJ SOLN
50.0000 mg | Freq: Once | INTRAMUSCULAR | Status: AC
  Administered 2018-01-04: 50 mg via INTRAVENOUS

## 2018-01-04 MED ORDER — PALONOSETRON HCL INJECTION 0.25 MG/5ML
0.2500 mg | Freq: Once | INTRAVENOUS | Status: AC
  Administered 2018-01-04: 0.25 mg via INTRAVENOUS

## 2018-01-04 MED ORDER — PALONOSETRON HCL INJECTION 0.25 MG/5ML
INTRAVENOUS | Status: AC
  Filled 2018-01-04: qty 5

## 2018-01-04 MED ORDER — SODIUM CHLORIDE 0.9 % IV SOLN
180.8000 mg | Freq: Once | INTRAVENOUS | Status: AC
  Administered 2018-01-04: 180 mg via INTRAVENOUS
  Filled 2018-01-04: qty 18

## 2018-01-04 MED ORDER — SODIUM CHLORIDE 0.9 % IV SOLN
Freq: Once | INTRAVENOUS | Status: AC
  Administered 2018-01-04: 10:00:00 via INTRAVENOUS
  Filled 2018-01-04: qty 250

## 2018-01-04 MED ORDER — DIPHENHYDRAMINE HCL 50 MG/ML IJ SOLN
INTRAMUSCULAR | Status: AC
  Filled 2018-01-04: qty 1

## 2018-01-04 MED ORDER — FAMOTIDINE IN NACL 20-0.9 MG/50ML-% IV SOLN
INTRAVENOUS | Status: AC
  Filled 2018-01-04: qty 50

## 2018-01-04 MED ORDER — SODIUM CHLORIDE 0.9 % IV SOLN
20.0000 mg | Freq: Once | INTRAVENOUS | Status: AC
  Administered 2018-01-04: 20 mg via INTRAVENOUS
  Filled 2018-01-04: qty 2

## 2018-01-04 MED ORDER — FAMOTIDINE IN NACL 20-0.9 MG/50ML-% IV SOLN
20.0000 mg | Freq: Once | INTRAVENOUS | Status: AC
  Administered 2018-01-04: 20 mg via INTRAVENOUS

## 2018-01-04 NOTE — Telephone Encounter (Signed)
LMOM for patient to call and schedule follow-up appointment.   °

## 2018-01-04 NOTE — Telephone Encounter (Signed)
apts already scheduled per 9/9 los - central radiology to contact patient with ct scan .

## 2018-01-04 NOTE — Progress Notes (Signed)
[place] Telephone:(336) [phone]   Fax:(336) [phone]  OFFICE PROGRESS NOTE  Patient, No Pcp Per No address on file  DIAGNOSIS: Stage IIIA (T3, N1/N2, M0) non-small cell lung cancer, adenocarcinoma presented with large right lower lobe lung mass with extension to the right hilum and subcarinal area diagnosed in July 2019.  Biomarker Findings Tumor Mutational Burden - TMB-Intermediate (6 Muts/Mb) Microsatellite status - MS-Stable Genomic Findings For a complete list of the genes assayed, please refer to the Appendix. NRAS Q61R ARAF amplification STK11 G56W KRAS G13D MYCN amplification MCL1 amplification NKX2-1 amplification - equivocal? TP53 G245V 7 Disease relevant genes with no reportable alterations: EGFR, ALK, BRAF, MET, ERBB2, RET, ROS1   PRIOR THERAPY: None.  CURRENT THERAPY: Course of concurrent chemoradiation with weekly carboplatin for AUC of 2 and paclitaxel 45 mg/M2.  Status post 5 cycles.  INTERVAL HISTORY: Susan Holder 62 y.o. female returns to the clinic today for follow-up visit.  The patient is feeling fine today with no specific complaints except for the persistent fatigue as well as sore throat and odynophagia.  She is currently in Carafate and helping some.  She has intolerance to the pain medication.  She denied having any chest pain, shortness breath, cough or hemoptysis.  She denied having any nausea, vomiting, diarrhea or constipation.  She is still able to maintain her nutrition and no significant weight loss.  She is here today for evaluation before starting cycle #6 of her concurrent chemoradiation.  MEDICAL HISTORY: Past Medical History:  Diagnosis Date  . Rheumatoid arthritis ([place])     ALLERGIES:  has No Known Allergies.  MEDICATIONS:  Current Outpatient Medications  Medication Sig Dispense Refill  . albuterol (PROVENTIL HFA) 108 (90 Base) MCG/ACT inhaler Inhale 1 puff into the lungs every 6 (six) hours as needed for  wheezing or shortness of breath.     Marland Kitchen aspirin EC 81 MG tablet Take 81 mg by mouth daily.    . cetirizine (ZYRTEC) 10 MG tablet Take by mouth.    . famotidine (PEPCID) 40 MG tablet Take 40 mg by mouth daily.    . hydroxychloroquine (PLAQUENIL) 200 MG tablet TAKE 1 TABLET BY MOUTH TWICE DAILY MONDAY AND FRIDAY 40 tablet 0  . lidocaine (XYLOCAINE) 2 % solution Use as directed 10-15 mLs in the mouth or throat every 6 (six) hours as needed for mouth pain. 100 mL 2  . naproxen sodium (ALEVE) 220 MG tablet Take 220 mg by mouth daily as needed (PAIN).    Marland Kitchen pantoprazole (PROTONIX) 40 MG tablet Take 1 tablet (40 mg total) by mouth at bedtime. 30 tablet 1  . predniSONE (STERAPRED UNI-PAK 48 TAB) 10 MG (48) TBPK tablet Take by mouth.    . Probiotic Product (ALIGN PO) Take 1 capsule by mouth at bedtime.    . prochlorperazine (COMPAZINE) 10 MG tablet Take 1 tablet (10 mg total) by mouth every 6 (six) hours as needed for nausea or vomiting. 30 tablet 0  . pseudoephedrine-guaifenesin (MUCINEX D) 60-600 MG 12 hr tablet Take 1 tablet by mouth every 12 (twelve) hours.    . sucralfate (CARAFATE) 1 g tablet Take 1 tablet (1 g total) by mouth 4 (four) times daily. 120 tablet 2  . traMADol (ULTRAM) 50 MG tablet Take 1 tablet (50 mg total) by mouth every 6 (six) hours as needed. 60 tablet 0  . Vitamin D, Ergocalciferol, (DRISDOL) 50000 units CAPS capsule Take 1 capsule (50,000 Units total) by mouth  every 30 (thirty) days. 3 capsule 1   No current facility-administered medications for this visit.     SURGICAL HISTORY:  Past Surgical History:  Procedure Laterality Date  . BACK SURGERY    . BRONCHIAL NEEDLE ASPIRATION BIOPSY  11/09/2017   Procedure: BRONCHIAL NEEDLE ASPIRATION BIOPSIES;  Surgeon: Marshell Garfinkel, MD;  Location: [facility];  Service: Cardiopulmonary;;  . ENDOBRONCHIAL ULTRASOUND Bilateral 11/09/2017   Procedure: ENDOBRONCHIAL ULTRASOUND;  Surgeon: Marshell Garfinkel, MD;  Location: [facility];   Service: Cardiopulmonary;  Laterality: Bilateral;  . NASAL SINUS SURGERY    . NASAL SINUS SURGERY    . TUBAL LIGATION    . VIDEO BRONCHOSCOPY  11/09/2017   Procedure: VIDEO BRONCHOSCOPY;  Surgeon: Marshell Garfinkel, MD;  Location: [facility];  Service: Cardiopulmonary;;    REVIEW OF SYSTEMS:  A comprehensive review of systems was negative except for: Constitutional: positive for fatigue Gastrointestinal: positive for dysphagia and odynophagia   PHYSICAL EXAMINATION: General appearance: alert, cooperative, fatigued and no distress Head: Normocephalic, without obvious abnormality, atraumatic Neck: no adenopathy, no JVD, supple, symmetrical, trachea midline and thyroid not enlarged, symmetric, no tenderness/mass/nodules Lymph nodes: Cervical, supraclavicular, and axillary nodes normal. Resp: clear to auscultation bilaterally Back: symmetric, no curvature. ROM normal. No CVA tenderness. Cardio: regular rate and rhythm, S1, S2 normal, no murmur, click, rub or gallop GI: soft, non-tender; bowel sounds normal; no masses,  no organomegaly Extremities: extremities normal, atraumatic, no cyanosis or edema  ECOG PERFORMANCE STATUS: 1 - Symptomatic but completely ambulatory  Blood pressure 129/85, pulse (!) 107, temperature 98.1 F (36.7 C), temperature source Oral, resp. rate 17, height 5' (1.524 m), weight 122 lb 9.6 oz (55.6 kg), last menstrual period 04/28/2000, SpO2 99 %.  LABORATORY DATA: Lab Results  Component Value Date   WBC 3.5 (L) 01/04/2018   HGB 11.4 (L) 01/04/2018   HCT 34.8 01/04/2018   MCV 92.4 01/04/2018   PLT 293 01/04/2018      Chemistry      Component Value Date/Time   NA 136 12/29/2017 0835   K 3.4 (L) 12/29/2017 0835   CL 102 12/29/2017 0835   CO2 25 12/29/2017 0835   BUN 7 (L) 12/29/2017 0835   CREATININE 0.71 12/29/2017 0835   CREATININE 0.80 07/24/2017 1438      Component Value Date/Time   CALCIUM 9.6 12/29/2017 0835   ALKPHOS 96 12/29/2017 0835   AST 27  12/29/2017 0835   ALT 21 12/29/2017 0835   BILITOT 0.3 12/29/2017 0835       RADIOGRAPHIC STUDIES: No results found.  ASSESSMENT AND PLAN: This is a very pleasant 62 years old African-American female recently diagnosed with a stage IIIA non-small cell lung cancer, adenocarcinoma.  She is currently undergoing a course of concurrent chemoradiation with weekly carboplatin and paclitaxel status post 5 cycles.   She is tolerating this treatment well except for fatigue, dysphagia and odynophagia. I recommended for the patient to proceed with cycle #6 today as scheduled.  She is expected to complete this course of concurrent chemoradiation next week For the odynophagia, she will continue on Carafate. I will see the patient back for follow-up visit in 1 month for evaluation with repeat CT scan of the chest for restaging of her disease. The patient was advised to call immediately if she has any concerning symptoms in the interval. The patient voices understanding of current disease status and treatment options and is in agreement with the current care plan.  All questions were answered. The patient knows  to call the clinic with any problems, questions or concerns. We can certainly see the patient much sooner if necessary.  I spent 10 minutes counseling the patient face to face. The total time spent in the appointment was 15 minutes.  Disclaimer: This note was dictated with voice recognition software. Similar sounding words can inadvertently be transcribed and may not be corrected upon review.

## 2018-01-04 NOTE — Telephone Encounter (Signed)
-----   Message from [place] sent at 12/30/2017  8:44 AM EDT ----- Regarding: follow up Please call patient and schedule follow up, patient is due now. Thanks!

## 2018-01-04 NOTE — Patient Instructions (Signed)
    Cancer Center Discharge Instructions for Patients Receiving Chemotherapy  Today you received the following chemotherapy agents Taxol and Carboplatin   To help prevent nausea and vomiting after your treatment, we encourage you to take your nausea medication as directed.    If you develop nausea and vomiting that is not controlled by your nausea medication, call the clinic.   BELOW ARE SYMPTOMS THAT SHOULD BE REPORTED IMMEDIATELY:  *FEVER GREATER THAN 100.5 F  *CHILLS WITH OR WITHOUT FEVER  NAUSEA AND VOMITING THAT IS NOT CONTROLLED WITH YOUR NAUSEA MEDICATION  *UNUSUAL SHORTNESS OF BREATH  *UNUSUAL BRUISING OR BLEEDING  TENDERNESS IN MOUTH AND THROAT WITH OR WITHOUT PRESENCE OF ULCERS  *URINARY PROBLEMS  *BOWEL PROBLEMS  UNUSUAL RASH Items with * indicate a potential emergency and should be followed up as soon as possible.  Feel free to call the clinic should you have any questions or concerns. The clinic phone number is [phone].  Please show the CHEMO ALERT CARD at check-in to the Emergency Department and triage nurse.   

## 2018-01-05 ENCOUNTER — Ambulatory Visit
Admission: RE | Admit: 2018-01-05 | Discharge: 2018-01-05 | Disposition: A | Discharge status: Home / Self Care | Payer: BC Managed Care – PPO | Source: Ambulatory Visit | Attending: Radiation Oncology | Admitting: Radiation Oncology

## 2018-01-05 DIAGNOSIS — C3431 Malignant neoplasm of lower lobe, right bronchus or lung: Secondary | ICD-10-CM | POA: Diagnosis not present

## 2018-01-06 ENCOUNTER — Ambulatory Visit
Admission: RE | Admit: 2018-01-06 | Discharge: 2018-01-06 | Disposition: A | Discharge status: Home / Self Care | Payer: BC Managed Care – PPO | Source: Ambulatory Visit | Attending: Radiation Oncology | Admitting: Radiation Oncology

## 2018-01-06 DIAGNOSIS — C3431 Malignant neoplasm of lower lobe, right bronchus or lung: Secondary | ICD-10-CM | POA: Diagnosis not present

## 2018-01-07 ENCOUNTER — Ambulatory Visit
Admission: RE | Admit: 2018-01-07 | Discharge: 2018-01-07 | Disposition: A | Discharge status: Home / Self Care | Payer: BC Managed Care – PPO | Source: Ambulatory Visit | Attending: Radiation Oncology | Admitting: Radiation Oncology

## 2018-01-07 DIAGNOSIS — C3431 Malignant neoplasm of lower lobe, right bronchus or lung: Secondary | ICD-10-CM | POA: Diagnosis not present

## 2018-01-08 ENCOUNTER — Ambulatory Visit
Admission: RE | Admit: 2018-01-08 | Discharge: 2018-01-08 | Disposition: A | Discharge status: Home / Self Care | Payer: BC Managed Care – PPO | Source: Ambulatory Visit | Attending: Radiation Oncology | Admitting: Radiation Oncology

## 2018-01-08 DIAGNOSIS — C3431 Malignant neoplasm of lower lobe, right bronchus or lung: Secondary | ICD-10-CM | POA: Diagnosis not present

## 2018-01-11 ENCOUNTER — Inpatient Hospital Stay: Payer: BC Managed Care – PPO

## 2018-01-11 ENCOUNTER — Other Ambulatory Visit: Payer: Self-pay | Admitting: *Deleted

## 2018-01-11 ENCOUNTER — Other Ambulatory Visit: Payer: Self-pay | Admitting: Medical Oncology

## 2018-01-11 ENCOUNTER — Ambulatory Visit
Admission: RE | Admit: 2018-01-11 | Discharge: 2018-01-11 | Disposition: A | Discharge status: Home / Self Care | Payer: BC Managed Care – PPO | Source: Ambulatory Visit | Attending: Radiation Oncology | Admitting: Radiation Oncology

## 2018-01-11 VITALS — BP 134/67 | HR 100 | Temp 98.4°F | Resp 18 | Ht 60.0 in | Wt 119.0 lb

## 2018-01-11 DIAGNOSIS — E876 Hypokalemia: Secondary | ICD-10-CM

## 2018-01-11 DIAGNOSIS — C3491 Malignant neoplasm of unspecified part of right bronchus or lung: Secondary | ICD-10-CM

## 2018-01-11 DIAGNOSIS — Z5111 Encounter for antineoplastic chemotherapy: Secondary | ICD-10-CM | POA: Diagnosis not present

## 2018-01-11 DIAGNOSIS — C3431 Malignant neoplasm of lower lobe, right bronchus or lung: Secondary | ICD-10-CM | POA: Diagnosis not present

## 2018-01-11 LAB — CBC WITH DIFFERENTIAL (CANCER CENTER ONLY)
Basophils Absolute: 0 10*3/uL (ref 0.0–0.1)
Basophils Relative: 0 %
Eosinophils Absolute: 0 10*3/uL (ref 0.0–0.5)
Eosinophils Relative: 0 %
HCT: 31.5 % — ABNORMAL LOW (ref 34.8–46.6)
Hemoglobin: 10.4 g/dL — ABNORMAL LOW (ref 11.6–15.9)
Lymphocytes Relative: 12 %
Lymphs Abs: 0.3 10*3/uL — ABNORMAL LOW (ref 0.9–3.3)
MCH: 30.6 pg (ref 25.1–34.0)
MCHC: 33 g/dL (ref 31.5–36.0)
MCV: 92.6 fL (ref 79.5–101.0)
Monocytes Absolute: 0.3 10*3/uL (ref 0.1–0.9)
Monocytes Relative: 11 %
Neutro Abs: 2 10*3/uL (ref 1.5–6.5)
Neutrophils Relative %: 77 %
Platelet Count: 244 10*3/uL (ref 145–400)
RBC: 3.4 MIL/uL — ABNORMAL LOW (ref 3.70–5.45)
RDW: 14.8 % — ABNORMAL HIGH (ref 11.2–14.5)
WBC Count: 2.7 10*3/uL — ABNORMAL LOW (ref 3.9–10.3)

## 2018-01-11 LAB — CMP (CANCER CENTER ONLY)
ALT: 10 U/L (ref 0–44)
AST: 20 U/L (ref 15–41)
Albumin: 3.4 g/dL — ABNORMAL LOW (ref 3.5–5.0)
Alkaline Phosphatase: 77 U/L (ref 38–126)
Anion gap: 11 (ref 5–15)
BUN: 5 mg/dL — ABNORMAL LOW (ref 8–23)
CO2: 27 mmol/L (ref 22–32)
Calcium: 9.6 mg/dL (ref 8.9–10.3)
Chloride: 102 mmol/L (ref 98–111)
Creatinine: 0.72 mg/dL (ref 0.44–1.00)
GFR, Est AFR Am: >60 mL/min (ref >60)
GFR, Estimated: >60 mL/min (ref >60)
Glucose, Bld: 90 mg/dL (ref 70–99)
Potassium: 2.7 mmol/L — CL (ref 3.5–5.1)
Sodium: 140 mmol/L (ref 135–145)
Total Bilirubin: 0.3 mg/dL (ref 0.3–1.2)
Total Protein: 6.8 g/dL (ref 6.5–8.1)

## 2018-01-11 MED ORDER — DIPHENHYDRAMINE HCL 50 MG/ML IJ SOLN
50.0000 mg | Freq: Once | INTRAMUSCULAR | Status: AC
  Administered 2018-01-11: 50 mg via INTRAVENOUS

## 2018-01-11 MED ORDER — SODIUM CHLORIDE 0.9 % IV SOLN
45.0000 mg/m2 | Freq: Once | INTRAVENOUS | Status: AC
  Administered 2018-01-11: 72 mg via INTRAVENOUS
  Filled 2018-01-11: qty 12

## 2018-01-11 MED ORDER — FAMOTIDINE IN NACL 20-0.9 MG/50ML-% IV SOLN
20.0000 mg | Freq: Once | INTRAVENOUS | Status: AC
  Administered 2018-01-11: 20 mg via INTRAVENOUS

## 2018-01-11 MED ORDER — PALONOSETRON HCL INJECTION 0.25 MG/5ML
0.2500 mg | Freq: Once | INTRAVENOUS | Status: AC
  Administered 2018-01-11: 0.25 mg via INTRAVENOUS

## 2018-01-11 MED ORDER — PALONOSETRON HCL INJECTION 0.25 MG/5ML
INTRAVENOUS | Status: AC
  Filled 2018-01-11: qty 5

## 2018-01-11 MED ORDER — POTASSIUM CHLORIDE CRYS ER 20 MEQ PO TBCR: 20.0000 meq | EXTENDED_RELEASE_TABLET | Freq: Two times a day (BID) | ORAL | 0 refills | Status: DC

## 2018-01-11 MED ORDER — FAMOTIDINE IN NACL 20-0.9 MG/50ML-% IV SOLN
INTRAVENOUS | Status: AC
  Filled 2018-01-11: qty 50

## 2018-01-11 MED ORDER — SODIUM CHLORIDE 0.9 % IV SOLN
Freq: Once | INTRAVENOUS | Status: AC
  Administered 2018-01-11: 10:00:00 via INTRAVENOUS
  Filled 2018-01-11: qty 250

## 2018-01-11 MED ORDER — SODIUM CHLORIDE 0.9 % IV SOLN
180.8000 mg | Freq: Once | INTRAVENOUS | Status: AC
  Administered 2018-01-11: 180 mg via INTRAVENOUS
  Filled 2018-01-11: qty 18

## 2018-01-11 MED ORDER — SODIUM CHLORIDE 0.9 % IV SOLN
20.0000 mg | Freq: Once | INTRAVENOUS | Status: AC
  Administered 2018-01-11: 20 mg via INTRAVENOUS
  Filled 2018-01-11: qty 2

## 2018-01-11 MED ORDER — DIPHENHYDRAMINE HCL 50 MG/ML IJ SOLN
INTRAMUSCULAR | Status: AC
  Filled 2018-01-11: qty 1

## 2018-01-11 NOTE — Patient Instructions (Addendum)

## 2018-01-11 NOTE — Progress Notes (Deleted)
Pt okay to tx today without waiting for labs. Per Dr. Irene Limbo, ok [facility] 0.4 9/13. Thanks.

## 2018-01-12 ENCOUNTER — Ambulatory Visit
Admission: RE | Admit: 2018-01-12 | Discharge: 2018-01-12 | Disposition: A | Discharge status: Home / Self Care | Payer: BC Managed Care – PPO | Source: Ambulatory Visit | Attending: Radiation Oncology | Admitting: Radiation Oncology

## 2018-01-12 DIAGNOSIS — C3431 Malignant neoplasm of lower lobe, right bronchus or lung: Secondary | ICD-10-CM | POA: Diagnosis not present

## 2018-01-13 ENCOUNTER — Ambulatory Visit
Admission: RE | Admit: 2018-01-13 | Discharge: 2018-01-13 | Disposition: A | Discharge status: Home / Self Care | Payer: BC Managed Care – PPO | Source: Ambulatory Visit | Attending: Radiation Oncology | Admitting: Radiation Oncology

## 2018-01-13 DIAGNOSIS — C3431 Malignant neoplasm of lower lobe, right bronchus or lung: Secondary | ICD-10-CM | POA: Diagnosis not present

## 2018-01-14 ENCOUNTER — Ambulatory Visit
Admission: RE | Admit: 2018-01-14 | Discharge: 2018-01-14 | Disposition: A | Discharge status: Home / Self Care | Payer: BC Managed Care – PPO | Source: Ambulatory Visit | Attending: Radiation Oncology | Admitting: Radiation Oncology

## 2018-01-14 DIAGNOSIS — C3431 Malignant neoplasm of lower lobe, right bronchus or lung: Secondary | ICD-10-CM | POA: Diagnosis not present

## 2018-01-15 ENCOUNTER — Telehealth: Payer: Self-pay | Admitting: Internal Medicine

## 2018-01-15 NOTE — Telephone Encounter (Signed)
MM late start 10/7 moved appointments to later time. Spoke with patient. Patient to get updated schedule at 9/30 visit.

## 2018-01-18 ENCOUNTER — Ambulatory Visit: Payer: BC Managed Care – PPO

## 2018-01-18 ENCOUNTER — Other Ambulatory Visit: Payer: BC Managed Care – PPO

## 2018-01-18 ENCOUNTER — Ambulatory Visit: Payer: BC Managed Care – PPO | Admitting: Oncology

## 2018-01-25 ENCOUNTER — Inpatient Hospital Stay: Payer: BC Managed Care – PPO

## 2018-01-25 DIAGNOSIS — C3491 Malignant neoplasm of unspecified part of right bronchus or lung: Secondary | ICD-10-CM

## 2018-01-25 DIAGNOSIS — Z5111 Encounter for antineoplastic chemotherapy: Secondary | ICD-10-CM | POA: Diagnosis not present

## 2018-01-25 LAB — CBC WITH DIFFERENTIAL (CANCER CENTER ONLY)
Basophils Absolute: 0 10*3/uL (ref 0.0–0.1)
Basophils Relative: 1 %
Eosinophils Absolute: 0 10*3/uL (ref 0.0–0.5)
Eosinophils Relative: 0 %
HCT: 32.3 % — ABNORMAL LOW (ref 34.8–46.6)
Hemoglobin: 10.7 g/dL — ABNORMAL LOW (ref 11.6–15.9)
Lymphocytes Relative: 13 %
Lymphs Abs: 0.4 10*3/uL — ABNORMAL LOW (ref 0.9–3.3)
MCH: 31.2 pg (ref 25.1–34.0)
MCHC: 33.2 g/dL (ref 31.5–36.0)
MCV: 93.8 fL (ref 79.5–101.0)
Monocytes Absolute: 0.8 10*3/uL (ref 0.1–0.9)
Monocytes Relative: 24 %
Neutro Abs: 2 10*3/uL (ref 1.5–6.5)
Neutrophils Relative %: 62 %
Platelet Count: 298 10*3/uL (ref 145–400)
RBC: 3.44 MIL/uL — ABNORMAL LOW (ref 3.70–5.45)
RDW: 17.7 % — ABNORMAL HIGH (ref 11.2–14.5)
WBC Count: 3.3 10*3/uL — ABNORMAL LOW (ref 3.9–10.3)

## 2018-01-25 LAB — RESEARCH LABS

## 2018-01-29 ENCOUNTER — Ambulatory Visit (HOSPITAL_COMMUNITY)
Admission: RE | Admit: 2018-01-29 | Discharge: 2018-01-29 | Disposition: A | Discharge status: Home / Self Care | Payer: BC Managed Care – PPO | Source: Ambulatory Visit | Attending: Internal Medicine | Admitting: Internal Medicine

## 2018-01-29 DIAGNOSIS — J432 Centrilobular emphysema: Secondary | ICD-10-CM | POA: Diagnosis not present

## 2018-01-29 DIAGNOSIS — I700 Atherosclerosis of aorta: Secondary | ICD-10-CM | POA: Insufficient documentation

## 2018-01-29 DIAGNOSIS — C3490 Malignant neoplasm of unspecified part of unspecified bronchus or lung: Secondary | ICD-10-CM | POA: Insufficient documentation

## 2018-01-29 DIAGNOSIS — J9809 Other diseases of bronchus, not elsewhere classified: Secondary | ICD-10-CM | POA: Diagnosis not present

## 2018-01-29 DIAGNOSIS — R918 Other nonspecific abnormal finding of lung field: Secondary | ICD-10-CM | POA: Diagnosis not present

## 2018-01-29 MED ORDER — IOHEXOL 300 MG/ML  SOLN
100.0000 mL | Freq: Once | INTRAMUSCULAR | Status: AC | PRN
  Administered 2018-01-29: 75 mL via INTRAVENOUS

## 2018-01-29 MED ORDER — SODIUM CHLORIDE 0.9 % IJ SOLN
INTRAMUSCULAR | Status: AC
  Filled 2018-01-29: qty 50

## 2018-02-01 ENCOUNTER — Inpatient Hospital Stay: Payer: BC Managed Care – PPO | Attending: Internal Medicine

## 2018-02-01 ENCOUNTER — Telehealth: Payer: Self-pay | Admitting: *Deleted

## 2018-02-01 ENCOUNTER — Telehealth: Payer: Self-pay | Admitting: Internal Medicine

## 2018-02-01 ENCOUNTER — Encounter: Payer: Self-pay | Admitting: Internal Medicine

## 2018-02-01 ENCOUNTER — Ambulatory Visit: Payer: Self-pay | Admitting: Radiation Oncology

## 2018-02-01 ENCOUNTER — Inpatient Hospital Stay: Payer: BC Managed Care – PPO

## 2018-02-01 ENCOUNTER — Inpatient Hospital Stay (HOSPITAL_BASED_OUTPATIENT_CLINIC_OR_DEPARTMENT_OTHER): Payer: BC Managed Care – PPO | Admitting: Internal Medicine

## 2018-02-01 VITALS — BP 144/83 | HR 100 | Temp 98.4°F | Resp 18 | Ht 60.0 in | Wt 116.4 lb

## 2018-02-01 DIAGNOSIS — C3431 Malignant neoplasm of lower lobe, right bronchus or lung: Secondary | ICD-10-CM | POA: Insufficient documentation

## 2018-02-01 DIAGNOSIS — Z5112 Encounter for antineoplastic immunotherapy: Secondary | ICD-10-CM

## 2018-02-01 DIAGNOSIS — R1310 Dysphagia, unspecified: Secondary | ICD-10-CM | POA: Insufficient documentation

## 2018-02-01 DIAGNOSIS — C3491 Malignant neoplasm of unspecified part of right bronchus or lung: Secondary | ICD-10-CM

## 2018-02-01 DIAGNOSIS — C3490 Malignant neoplasm of unspecified part of unspecified bronchus or lung: Secondary | ICD-10-CM

## 2018-02-01 DIAGNOSIS — Z7189 Other specified counseling: Secondary | ICD-10-CM

## 2018-02-01 HISTORY — DX: Encounter for antineoplastic immunotherapy: Z51.12

## 2018-02-01 LAB — CBC WITH DIFFERENTIAL (CANCER CENTER ONLY)
Basophils Absolute: 0 10*3/uL (ref 0.0–0.1)
Basophils Relative: 1 %
Eosinophils Absolute: 0 10*3/uL (ref 0.0–0.5)
Eosinophils Relative: 1 %
HCT: 31.2 % — ABNORMAL LOW (ref 34.8–46.6)
Hemoglobin: 10.3 g/dL — ABNORMAL LOW (ref 11.6–15.9)
Lymphocytes Relative: 16 %
Lymphs Abs: 0.5 10*3/uL — ABNORMAL LOW (ref 0.9–3.3)
MCH: 31.4 pg (ref 25.1–34.0)
MCHC: 33.1 g/dL (ref 31.5–36.0)
MCV: 94.8 fL (ref 79.5–101.0)
Monocytes Absolute: 0.6 10*3/uL (ref 0.1–0.9)
Monocytes Relative: 20 %
Neutro Abs: 2.1 10*3/uL (ref 1.5–6.5)
Neutrophils Relative %: 62 %
Platelet Count: 262 10*3/uL (ref 145–400)
RBC: 3.29 MIL/uL — ABNORMAL LOW (ref 3.70–5.45)
RDW: 18.3 % — ABNORMAL HIGH (ref 11.2–14.5)
WBC Count: 3.3 10*3/uL — ABNORMAL LOW (ref 3.9–10.3)

## 2018-02-01 LAB — CMP (CANCER CENTER ONLY)
ALT: 13 U/L (ref 0–44)
AST: 24 U/L (ref 15–41)
Albumin: 3.4 g/dL — ABNORMAL LOW (ref 3.5–5.0)
Alkaline Phosphatase: 75 U/L (ref 38–126)
Anion gap: 8 (ref 5–15)
BUN: 5 mg/dL — ABNORMAL LOW (ref 8–23)
CO2: 27 mmol/L (ref 22–32)
Calcium: 9.4 mg/dL (ref 8.9–10.3)
Chloride: 105 mmol/L (ref 98–111)
Creatinine: 0.74 mg/dL (ref 0.44–1.00)
GFR, Est AFR Am: >60 mL/min (ref >60)
GFR, Estimated: >60 mL/min (ref >60)
Glucose, Bld: 88 mg/dL (ref 70–99)
Potassium: 3.6 mmol/L (ref 3.5–5.1)
Sodium: 140 mmol/L (ref 135–145)
Total Bilirubin: 0.2 mg/dL — ABNORMAL LOW (ref 0.3–1.2)
Total Protein: 6.8 g/dL (ref 6.5–8.1)

## 2018-02-01 NOTE — Progress Notes (Signed)
DISCONTINUE ON PATHWAY REGIMEN - Non-Small Cell Lung     Administer weekly:     Paclitaxel      Carboplatin   **Always confirm dose/schedule in your pharmacy ordering system**  REASON: Continuation Of Treatment PRIOR TREATMENT: QAS341: Carboplatin AUC=2 + Paclitaxel 45 mg/m2 Weekly During Radiation TREATMENT RESPONSE: Stable Disease (SD)  START ON PATHWAY REGIMEN - Non-Small Cell Lung     A cycle is every 14 days:     Durvalumab   **Always confirm dose/schedule in your pharmacy ordering system**  Patient Characteristics: Stage III - Unresectable, PS = 0, 1 AJCC T Category: T3 Current Disease Status: No Distant Mets or Local Recurrence AJCC N Category: N1 AJCC M Category: M0 AJCC 8 Stage Grouping: IIIA Performance Status: PS = 0, 1 Intent of Therapy: Curative Intent, Discussed with Patient

## 2018-02-01 NOTE — Progress Notes (Signed)
[place] [phone]   Fax:(336) [phone]  OFFICE PROGRESS NOTE  Patient, No Pcp Per No address on file  DIAGNOSIS: Stage IIIA (T3, N1/N2, M0) non-small cell lung cancer, adenocarcinoma presented with large right lower lobe lung mass with extension to the right hilum and subcarinal area diagnosed in July 2019.  Biomarker Findings Tumor Mutational Burden - TMB-Intermediate (6 Muts/Mb) Microsatellite status - MS-Stable Genomic Findings For a complete list of the genes assayed, please refer to the Appendix. NRAS Q61R ARAF amplification STK11 G56W KRAS G13D MYCN amplification MCL1 amplification NKX2-1 amplification - equivocal? TP53 G245V 7 Disease relevant genes with no reportable alterations: EGFR, ALK, BRAF, MET, ERBB2, RET, ROS1   PRIOR THERAPY: Course of concurrent chemoradiation with weekly carboplatin for AUC of 2 and paclitaxel 45 mg/M2.  Status post 7 cycles.  Last dose was giving 01/11/2018.  CURRENT THERAPY: Consolidation treatment with immunotherapy with Imfinzi (Durvalumab) 10 mg/KG every 2 weeks.  First dose February 09, 2018  INTERVAL HISTORY: Susan Holder 62 y.o. female returns to the clinic today for follow-up visit.  The patient is feeling fine today with no concerning complaints.  She completed a course of concurrent chemoradiation with weekly carboplatin and paclitaxel and tolerated this course well except for mild odynophagia and dysphagia.  She is complaining of mild cough with no hemoptysis.  She denied having any chest pain, shortness of breath.  She denied having any nausea, vomiting, diarrhea or constipation.  She denied having any significant weight loss or night sweats.  The patient is here today for evaluation with repeat CT scan of the chest for restaging of her disease.  MEDICAL HISTORY: Past Medical History:  Diagnosis Date  . Rheumatoid arthritis ([place])     ALLERGIES:  has No Known Allergies.  MEDICATIONS:    Current Outpatient Medications  Medication Sig Dispense Refill  . albuterol (PROVENTIL HFA) 108 (90 Base) MCG/ACT inhaler Inhale 1 puff into the lungs every 6 (six) hours as needed for wheezing or shortness of breath.     Marland Kitchen aspirin EC 81 MG tablet Take 81 mg by mouth daily.    . cetirizine (ZYRTEC) 10 MG tablet Take by mouth.    . famotidine (PEPCID) 40 MG tablet Take 40 mg by mouth daily.    . hydroxychloroquine (PLAQUENIL) 200 MG tablet TAKE 1 TABLET BY MOUTH TWICE DAILY MONDAY AND FRIDAY 40 tablet 0  . lidocaine (XYLOCAINE) 2 % solution Use as directed 10-15 mLs in the mouth or throat every 6 (six) hours as needed for mouth pain. 100 mL 2  . naproxen sodium (ALEVE) 220 MG tablet Take 220 mg by mouth daily as needed (PAIN).    Marland Kitchen pantoprazole (PROTONIX) 40 MG tablet Take 1 tablet (40 mg total) by mouth at bedtime. 30 tablet 1  . potassium chloride SA (K-DUR,KLOR-CON) 20 MEQ tablet Take 1 tablet (20 mEq total) by mouth 2 (two) times daily. For 10 days 20 tablet 0  . predniSONE (STERAPRED UNI-PAK 48 TAB) 10 MG (48) TBPK tablet Take by mouth.    . Probiotic Product (ALIGN PO) Take 1 capsule by mouth at bedtime.    . prochlorperazine (COMPAZINE) 10 MG tablet Take 1 tablet (10 mg total) by mouth every 6 (six) hours as needed for nausea or vomiting. 30 tablet 0  . pseudoephedrine-guaifenesin (MUCINEX D) 60-600 MG 12 hr tablet Take 1 tablet by mouth every 12 (twelve) hours.    . sucralfate (CARAFATE) 1 g tablet Take  1 tablet (1 g total) by mouth 4 (four) times daily. 120 tablet 2  . traMADol (ULTRAM) 50 MG tablet Take 1 tablet (50 mg total) by mouth every 6 (six) hours as needed. 60 tablet 0  . Vitamin D, Ergocalciferol, (DRISDOL) 50000 units CAPS capsule Take 1 capsule (50,000 Units total) by mouth every 30 (thirty) days. 3 capsule 1   No current facility-administered medications for this visit.     SURGICAL HISTORY:  Past Surgical History:  Procedure Laterality Date  . BACK SURGERY    .  BRONCHIAL NEEDLE ASPIRATION BIOPSY  11/09/2017   Procedure: BRONCHIAL NEEDLE ASPIRATION BIOPSIES;  Surgeon: Marshell Garfinkel, MD;  Location: [facility];  Service: Cardiopulmonary;;  . ENDOBRONCHIAL ULTRASOUND Bilateral 11/09/2017   Procedure: ENDOBRONCHIAL ULTRASOUND;  Surgeon: Marshell Garfinkel, MD;  Location: [facility];  Service: Cardiopulmonary;  Laterality: Bilateral;  . NASAL SINUS SURGERY    . NASAL SINUS SURGERY    . TUBAL LIGATION    . VIDEO BRONCHOSCOPY  11/09/2017   Procedure: VIDEO BRONCHOSCOPY;  Surgeon: Marshell Garfinkel, MD;  Location: [facility];  Service: Cardiopulmonary;;    REVIEW OF SYSTEMS:  Constitutional: negative Eyes: negative Ears, nose, mouth, throat, and face: negative Respiratory: positive for cough Cardiovascular: negative Gastrointestinal: negative Genitourinary:negative Integument/breast: negative Hematologic/lymphatic: negative Musculoskeletal:negative Neurological: negative Behavioral/Psych: negative Endocrine: negative Allergic/Immunologic: negative   PHYSICAL EXAMINATION: General appearance: alert, cooperative, fatigued and no distress Head: Normocephalic, without obvious abnormality, atraumatic Neck: no adenopathy, no JVD, supple, symmetrical, trachea midline and thyroid not enlarged, symmetric, no tenderness/mass/nodules Lymph nodes: Cervical, supraclavicular, and axillary nodes normal. Resp: clear to auscultation bilaterally Back: symmetric, no curvature. ROM normal. No CVA tenderness. Cardio: regular rate and rhythm, S1, S2 normal, no murmur, click, rub or gallop GI: soft, non-tender; bowel sounds normal; no masses,  no organomegaly Extremities: extremities normal, atraumatic, no cyanosis or edema Neurologic: Alert and oriented X 3, normal strength and tone. Normal symmetric reflexes. Normal coordination and gait  ECOG PERFORMANCE STATUS: 1 - Symptomatic but completely ambulatory  Blood pressure (!) 144/83, pulse 100, temperature 98.4 F  (36.9 C), temperature source Oral, resp. rate 18, height 5' (1.524 m), weight 116 lb 6.4 oz (52.8 kg), last menstrual period 04/28/2000, SpO2 100 %.  LABORATORY DATA: Lab Results  Component Value Date   WBC 3.3 (L) 02/01/2018   HGB 10.3 (L) 02/01/2018   HCT 31.2 (L) 02/01/2018   MCV 94.8 02/01/2018   PLT 262 02/01/2018      Chemistry      Component Value Date/Time   NA 140 01/11/2018 0810   K 2.7 (LL) 01/11/2018 0810   CL 102 01/11/2018 0810   CO2 27 01/11/2018 0810   BUN 5 (L) 01/11/2018 0810   CREATININE 0.72 01/11/2018 0810   CREATININE 0.80 07/24/2017 1438      Component Value Date/Time   CALCIUM 9.6 01/11/2018 0810   ALKPHOS 77 01/11/2018 0810   AST 20 01/11/2018 0810   ALT 10 01/11/2018 0810   BILITOT 0.3 01/11/2018 0810       RADIOGRAPHIC STUDIES: Ct Chest W Contrast  Result Date: 01/29/2018 CLINICAL DATA:  Stage IIIA right lower lobe lung adenocarcinoma diagnosed July 2019 status post concurrent chemoradiation therapy. Restaging. EXAM: CT CHEST WITH CONTRAST TECHNIQUE: Multidetector CT imaging of the chest was performed during intravenous contrast administration. CONTRAST:  105m OMNIPAQUE IOHEXOL 300 MG/ML  SOLN COMPARISON:  10/06/2017 PET-CT.  Outside chest CT from 09/29/2017. FINDINGS: Cardiovascular: Normal heart size. No significant pericardial effusion/thickening. Atherosclerotic nonaneurysmal thoracic aorta.  Normal caliber pulmonary arteries. No central pulmonary emboli. Mediastinum/Nodes: Subcentimeter hypodense anterior right thyroid lobe nodule. Fluid level in the midthoracic esophagus, suggesting dysmotility and/or reflux. No axillary adenopathy. The subcarinal portion of the right infrahilar lung mass measures 1.2 cm in short axis diameter (series 2/image 63), mildly increased from 0.7 cm on 09/29/2017 outside chest CT. No new pathologically enlarged mediastinal or hilar nodes. Lungs/Pleura: No pneumothorax. No pleural effusion. Moderate centrilobular emphysema  with mild diffuse bronchial wall thickening. Right infrahilar 3.8 x 3.0 cm lung mass (series 2/image 67), previously 4.5 x 3.3 cm on 09/29/2017 chest CT using similar measurement technique, mildly decreased. Stable narrowing of the bronchus intermedius and central right middle and right lower lobe bronchi. No acute consolidative airspace disease or new significant pulmonary nodules. Upper abdomen: No acute abnormality. Musculoskeletal: No aggressive appearing focal osseous lesions. Marked thoracic spondylosis. IMPRESSION: 1. Overall, the right infrahilar lung mass is mildly decreased in size. However, the portion of this mass that directly invades the subcarinal region has mildly increased. Stable narrowing of the bronchus intermedius and central right middle lobe/right lower lobe bronchi. 2. Otherwise no new or progressive metastatic disease in the chest. Aortic Atherosclerosis (ICD10-I70.0) and Emphysema (ICD10-J43.9). Electronically Signed   By: Ilona Sorrel M.D.   On: 01/29/2018 11:34    ASSESSMENT AND PLAN: This is a very pleasant 62 years old African-American female recently diagnosed with a stage IIIA non-small cell lung cancer, adenocarcinoma.  She is currently undergoing a course of concurrent chemoradiation with weekly carboplatin and paclitaxel status post 7 cycles.   The patient tolerated this course of treatment well except for mild odynophagia and dysphagia. She had repeat CT scan of the chest performed recently.  I personally and independently reviewed the scan images and discussed the results with the patient today.  Her scan showed stable disease in general with mild decrease in in the right hilar mass with a slight increase in the subcarinal lymph node. I recommended for the patient to proceed with a course of consolidation treatment with immunotherapy with Imfinzi (Durvalumab) 10 mg/KG every 2 weeks.  I discussed with the patient the adverse effect of this treatment including but not limited  to immunotherapy mediated skin rash, diarrhea, inflammation of the lung, kidney, liver, thyroid or other endocrine dysfunction. The patient would like to proceed with this treatment as planned. She is expected to start the first dose of this treatment next week. She was advised to call immediately if she has any concerning symptoms in the interval. The patient voices understanding of current disease status and treatment options and is in agreement with the current care plan.  All questions were answered. The patient knows to call the clinic with any problems, questions or concerns. We can certainly see the patient much sooner if necessary.  Disclaimer: This note was dictated with voice recognition software. Similar sounding words can inadvertently be transcribed and may not be corrected upon review.

## 2018-02-01 NOTE — Telephone Encounter (Signed)
Called patient to ask about coming later for fu visit on 02-15-18, patient agreed to come on 02-15-18 @ 1 pm

## 2018-02-01 NOTE — Telephone Encounter (Signed)
Appts scheduled avs/calendar printed per 10/7 los

## 2018-02-08 ENCOUNTER — Inpatient Hospital Stay: Payer: BC Managed Care – PPO

## 2018-02-08 VITALS — BP 129/74 | HR 100 | Temp 98.6°F | Resp 17

## 2018-02-08 DIAGNOSIS — C3491 Malignant neoplasm of unspecified part of right bronchus or lung: Secondary | ICD-10-CM

## 2018-02-08 DIAGNOSIS — Z5112 Encounter for antineoplastic immunotherapy: Secondary | ICD-10-CM | POA: Diagnosis not present

## 2018-02-08 LAB — CBC WITH DIFFERENTIAL (CANCER CENTER ONLY)
Abs Immature Granulocytes: 0.02 10*3/uL (ref 0.00–0.07)
Basophils Absolute: 0 10*3/uL (ref 0.0–0.1)
Basophils Relative: 1 %
Eosinophils Absolute: 0.1 10*3/uL (ref 0.0–0.5)
Eosinophils Relative: 1 %
HCT: 31.9 % — ABNORMAL LOW (ref 36.0–46.0)
Hemoglobin: 10.4 g/dL — ABNORMAL LOW (ref 12.0–15.0)
Immature Granulocytes: 1 %
Lymphocytes Relative: 17 %
Lymphs Abs: 0.8 10*3/uL (ref 0.7–4.0)
MCH: 31.5 pg (ref 26.0–34.0)
MCHC: 32.6 g/dL (ref 30.0–36.0)
MCV: 96.7 fL (ref 80.0–100.0)
Monocytes Absolute: 0.6 10*3/uL (ref 0.1–1.0)
Monocytes Relative: 13 %
Neutro Abs: 2.9 10*3/uL (ref 1.7–7.7)
Neutrophils Relative %: 67 %
Platelet Count: 264 10*3/uL (ref 150–400)
RBC: 3.3 MIL/uL — ABNORMAL LOW (ref 3.87–5.11)
RDW: 17.4 % — ABNORMAL HIGH (ref 11.5–15.5)
WBC Count: 4.3 10*3/uL (ref 4.0–10.5)
nRBC: 0 % (ref 0.0–0.2)

## 2018-02-08 LAB — COMPREHENSIVE METABOLIC PANEL
ALT: 28 U/L (ref 0–44)
AST: 37 U/L (ref 15–41)
Albumin: 3.7 g/dL (ref 3.5–5.0)
Alkaline Phosphatase: 80 U/L (ref 38–126)
Anion gap: 7 (ref 5–15)
BUN: 7 mg/dL — ABNORMAL LOW (ref 8–23)
CO2: 25 mmol/L (ref 22–32)
Calcium: 9.5 mg/dL (ref 8.9–10.3)
Chloride: 109 mmol/L (ref 98–111)
Creatinine, Ser: 0.66 mg/dL (ref 0.44–1.00)
GFR calc Af Amer: >60 mL/min (ref >60)
GFR calc non Af Amer: >60 mL/min (ref >60)
Glucose, Bld: 97 mg/dL (ref 70–99)
Potassium: 4 mmol/L (ref 3.5–5.1)
Sodium: 141 mmol/L (ref 135–145)
Total Bilirubin: 0.3 mg/dL (ref 0.3–1.2)
Total Protein: 7.2 g/dL (ref 6.5–8.1)

## 2018-02-08 MED ORDER — SODIUM CHLORIDE 0.9 % IV SOLN
Freq: Once | INTRAVENOUS | Status: AC
  Administered 2018-02-08: 10:00:00 via INTRAVENOUS
  Filled 2018-02-08: qty 250

## 2018-02-08 MED ORDER — SODIUM CHLORIDE 0.9 % IV SOLN
9.5000 mg/kg | Freq: Once | INTRAVENOUS | Status: AC
  Administered 2018-02-08: 500 mg via INTRAVENOUS
  Filled 2018-02-08: qty 10

## 2018-02-08 NOTE — Patient Instructions (Signed)
Durvalumab injection  What is this medicine?  DURVALUMAB (dur VAL ue mab) is a monoclonal antibody. It is used to treat urothelial cancer.  This medicine may be used for other purposes; ask your health care provider or pharmacist if you have questions.  COMMON BRAND NAME(S): IMFINZI  What should I tell my health care provider before I take this medicine?  They need to know if you have any of these conditions:  -diabetes  -immune system problems  -infection  -inflammatory bowel disease  -kidney disease  -liver disease  -lung or breathing disease  -lupus  -organ transplant  -stomach or intestine problems  -thyroid disease  -an unusual or allergic reaction to durvalumab, other medicines, foods, dyes, or preservatives  -pregnant or trying to get pregnant  -breast-feeding  How should I use this medicine?  This medicine is for infusion into a vein. It is given by a health care professional in a hospital or clinic setting.  A special MedGuide will be given to you before each treatment. Be sure to read this information carefully each time.  Talk to your pediatrician regarding the use of this medicine in children. Special care may be needed.  Overdosage: If you think you have taken too much of this medicine contact a poison control center or emergency room at once.  NOTE: This medicine is only for you. Do not share this medicine with others.  What if I miss a dose?  It is important not to miss your dose. Call your doctor or health care professional if you are unable to keep an appointment.  What may interact with this medicine?  Interactions have not been studied.  This list may not describe all possible interactions. Give your health care provider a list of all the medicines, herbs, non-prescription drugs, or dietary supplements you use. Also tell them if you smoke, drink alcohol, or use illegal drugs. Some items may interact with your medicine.  What should I watch for while using this medicine?  This drug may make you  feel generally unwell. Continue your course of treatment even though you feel ill unless your doctor tells you to stop.  You may need blood work done while you are taking this medicine.  Do not become pregnant while taking this medicine or for 3 months after stopping it. Women should inform their doctor if they wish to become pregnant or think they might be pregnant. There is a potential for serious side effects to an unborn child. Talk to your health care professional or pharmacist for more information. Do not breast-feed an infant while taking this medicine or for 3 months after stopping it.  What side effects may I notice from receiving this medicine?  Side effects that you should report to your doctor or health care professional as soon as possible:  -allergic reactions like skin rash, itching or hives, swelling of the face, lips, or tongue  -black, tarry stools  -bloody or watery diarrhea  -breathing problems  -change in emotions or moods  -change in sex drive  -changes in vision  -chest pain or chest tightness  -chills  -confusion  -cough  -facial flushing  -fever  -headache  -signs and symptoms of high blood sugar such as dizziness; dry mouth; dry skin; fruity breath; nausea; stomach pain; increased hunger or thirst; increased urination  -signs and symptoms of liver injury like dark yellow or brown urine; general ill feeling or flu-like symptoms; light-colored stools; loss of appetite; nausea; right upper belly pain;   unusually weak or tired; yellowing of the eyes or skin  -stomach pain  -trouble passing urine or change in the amount of urine  -weight gain or weight loss  Side effects that usually do not require medical attention (report these to your doctor or health care professional if they continue or are bothersome):  -bone pain  -constipation  -loss of appetite  -muscle pain  -nausea  -swelling of the ankles, feet, hands  -tiredness  This list may not describe all possible side effects. Call your doctor  for medical advice about side effects. You may report side effects to FDA at 1-800-FDA-1088.  Where should I keep my medicine?  This drug is given in a hospital or clinic and will not be stored at home.  NOTE: This sheet is a summary. It may not cover all possible information. If you have questions about this medicine, talk to your doctor, pharmacist, or health care provider.  © 2018 Elsevier/Gold Standard (2015-11-16 15:50:36)

## 2018-02-09 ENCOUNTER — Telehealth: Payer: Self-pay | Admitting: Rheumatology

## 2018-02-09 LAB — TSH: TSH: 1.23 u[IU]/mL (ref 0.308–3.960)

## 2018-02-09 MED ORDER — HYDROXYCHLOROQUINE SULFATE 200 MG PO TABS: ORAL_TABLET | ORAL | 0 refills | Status: DC

## 2018-02-09 NOTE — Telephone Encounter (Signed)
Patient has been going though cancer treatments, and has been unable to come for a rov. Patient has rov scheduled for  Thursday 10/24. Patient will be out of Plaquenil in two days, and needs a refill sent to [facility] on [address].

## 2018-02-09 NOTE — Telephone Encounter (Signed)
Last visit: 07/24/2017 Next visit: 02/18/18  Labs: 02/01/18 WBC 3.3 RBC 3.29, Hgb 10.3 HCT 31.2 BUN 5 Eye exam: 04/29/2017   Okay to refill per Dr. Estanislado Pandy

## 2018-02-09 NOTE — Progress Notes (Signed)
Office Visit Note  Patient: Susan Holder             Date of Birth: [birth year]           MRN: [id number]             PCP: Patient, No Pcp Per Referring: No ref. provider found Visit Date: 02/18/2018 Occupation: @GUAROCC @  Subjective:  Medication monitoring   History of Present Illness: Susan Holder is a 62 y.o. female with history of seropositive rheumatoid arthritis.  She is on PLQ 200 mg 1 tablet BID M-F. She is tolerating PLQ well. She has not had any recent flares of rheumatoid arthritis.  She denies any joint pain or joint swelling.  She denies any joint stiffness. She reports she no longer has locking or triggering of several fingers.     Activities of Daily Living:  Patient reports morning stiffness for 0 minutes.   Patient Denies nocturnal pain.  Difficulty dressing/grooming: Denies Difficulty climbing stairs: Denies Difficulty getting out of chair: Denies Difficulty using hands for taps, buttons, cutlery, and/or writing: Denies  Review of Systems  Constitutional: Positive for fatigue.  HENT: Negative for mouth sores, mouth dryness and nose dryness.   Eyes: Negative for pain, visual disturbance and dryness.  Respiratory: Negative for cough, hemoptysis, shortness of breath and difficulty breathing.   Cardiovascular: Negative for chest pain, palpitations, hypertension and swelling in legs/feet.  Gastrointestinal: Positive for blood in stool, constipation and diarrhea.  Endocrine: Negative for increased urination.  Genitourinary: Negative for painful urination.  Musculoskeletal: Negative for arthralgias, joint pain, joint swelling, myalgias, muscle weakness, morning stiffness, muscle tenderness and myalgias.  Skin: Negative for color change, pallor, rash, hair loss, nodules/bumps, skin tightness, ulcers and sensitivity to sunlight.  Allergic/Immunologic: Negative for susceptible to infections.  Neurological: Negative for dizziness, numbness, headaches and  weakness.  Hematological: Negative for swollen glands.  Psychiatric/Behavioral: Negative for depressed mood and sleep disturbance. The patient is not nervous/anxious.     PMFS History:  Patient Active Problem List   Diagnosis Date Noted  . Encounter for antineoplastic immunotherapy 02/01/2018  . Primary malignant neoplasm of bronchus of right lower lobe ([place]) 12/21/2017  . Adenocarcinoma of right lung, stage 3 ([place]) 11/17/2017  . Encounter for antineoplastic chemotherapy 11/17/2017  . Goals of care, counseling/discussion 11/17/2017  . Lung mass   . Left hand pain 03/06/2017  . Rheumatoid arthritis involving multiple sites with positive rheumatoid factor ([place]) 05/29/2016  . ANA positive 05/29/2016  . Vitamin D deficiency 05/29/2016  . High risk medication use 05/29/2016  . Trigger finger, left ring finger 05/29/2016  . Trigger finger, right ring finger 05/29/2016  . DDD (degenerative disc disease), cervical 05/29/2016  . Smoker 05/29/2016  . Other fatigue 05/29/2016  . Allergic rhinitis 08/22/2013    Past Medical History:  Diagnosis Date  . Rheumatoid arthritis ([place])     Family History  Problem Relation Age of Onset  . Stroke Mother   . Alzheimer's disease Mother   . Heart disease Mother   . Emphysema Father   . Hypertension Brother   . Heart attack Maternal Aunt   . Heart failure Maternal Grandmother   . Hypertension Paternal Grandmother    Past Surgical History:  Procedure Laterality Date  . BACK SURGERY    . BRONCHIAL NEEDLE ASPIRATION BIOPSY  11/09/2017   Procedure: BRONCHIAL NEEDLE ASPIRATION BIOPSIES;  Surgeon: Marshell Garfinkel, MD;  Location: [facility];  Service: Cardiopulmonary;;  . ENDOBRONCHIAL ULTRASOUND Bilateral 11/09/2017  Procedure: ENDOBRONCHIAL ULTRASOUND;  Surgeon: Marshell Garfinkel, MD;  Location: [facility];  Service: Cardiopulmonary;  Laterality: Bilateral;  . NASAL SINUS SURGERY    . NASAL SINUS SURGERY    . TUBAL LIGATION    . VIDEO  BRONCHOSCOPY  11/09/2017   Procedure: VIDEO BRONCHOSCOPY;  Surgeon: Marshell Garfinkel, MD;  Location: [facility];  Service: Cardiopulmonary;;   Social History   Social History Narrative  . Not on file    Objective: Vital Signs: BP 105/74 (BP Location: Left Arm, Patient Position: Sitting, Cuff Size: Normal)   Pulse (!) 107   Resp 12   Ht 5' (1.524 m)   Wt 116 lb 12.8 oz (53 kg)   LMP 04/28/2000   BMI 22.81 kg/m    Physical Exam  Constitutional: She is oriented to person, place, and time. She appears well-developed and well-nourished.  HENT:  Head: Normocephalic and atraumatic.  Eyes: Conjunctivae and EOM are normal.  Neck: Normal range of motion.  Cardiovascular: Normal rate, regular rhythm, normal heart sounds and intact distal pulses.  Pulmonary/Chest: Effort normal and breath sounds normal.  Abdominal: Soft. Bowel sounds are normal.  Lymphadenopathy:    She has no cervical adenopathy.  Neurological: She is alert and oriented to person, place, and time.  Skin: Skin is warm and dry. Capillary refill takes less than 2 seconds.  Psychiatric: She has a normal mood and affect. Her behavior is normal.  Nursing note and vitals reviewed.    Musculoskeletal Exam: C-spine, thoracic spine, lumbar spine good range of motion.  No midline spinal tenderness.  No SI joint tenderness.  Shoulder joints, elbow joints, wrist joints, MCPs, PIPs and DIPs good range of motion no synovitis.  Hip joints, knee joints, ankle joints, MTPs and PIPs and DIPs good range of motion with no synovitis.  No warmth or effusion bilateral knee joints.  No tenderness or swelling ankle joints.  She has PIP and DIP synovial thickening consistent with osteoarthritis of bilateral feet.  CDAI Exam: CDAI Score: 0  Patient Global Assessment: 0 (mm); Provider Global Assessment: 0 (mm) Swollen: 0 ; Tender: 0  Joint Exam   Not documented   There is currently no information documented on the homunculus. Go to the  Rheumatology activity and complete the homunculus joint exam.  Investigation: No additional findings.  Imaging: Ct Chest W Contrast  Result Date: 01/29/2018 CLINICAL DATA:  Stage IIIA right lower lobe lung adenocarcinoma diagnosed July 2019 status post concurrent chemoradiation therapy. Restaging. EXAM: CT CHEST WITH CONTRAST TECHNIQUE: Multidetector CT imaging of the chest was performed during intravenous contrast administration. CONTRAST:  1mL OMNIPAQUE IOHEXOL 300 MG/ML  SOLN COMPARISON:  10/06/2017 PET-CT.  Outside chest CT from 09/29/2017. FINDINGS: Cardiovascular: Normal heart size. No significant pericardial effusion/thickening. Atherosclerotic nonaneurysmal thoracic aorta. Normal caliber pulmonary arteries. No central pulmonary emboli. Mediastinum/Nodes: Subcentimeter hypodense anterior right thyroid lobe nodule. Fluid level in the midthoracic esophagus, suggesting dysmotility and/or reflux. No axillary adenopathy. The subcarinal portion of the right infrahilar lung mass measures 1.2 cm in short axis diameter (series 2/image 63), mildly increased from 0.7 cm on 09/29/2017 outside chest CT. No new pathologically enlarged mediastinal or hilar nodes. Lungs/Pleura: No pneumothorax. No pleural effusion. Moderate centrilobular emphysema with mild diffuse bronchial wall thickening. Right infrahilar 3.8 x 3.0 cm lung mass (series 2/image 67), previously 4.5 x 3.3 cm on 09/29/2017 chest CT using similar measurement technique, mildly decreased. Stable narrowing of the bronchus intermedius and central right middle and right lower lobe bronchi. No acute consolidative  airspace disease or new significant pulmonary nodules. Upper abdomen: No acute abnormality. Musculoskeletal: No aggressive appearing focal osseous lesions. Marked thoracic spondylosis. IMPRESSION: 1. Overall, the right infrahilar lung mass is mildly decreased in size. However, the portion of this mass that directly invades the subcarinal region has  mildly increased. Stable narrowing of the bronchus intermedius and central right middle lobe/right lower lobe bronchi. 2. Otherwise no new or progressive metastatic disease in the chest. Aortic Atherosclerosis (ICD10-I70.0) and Emphysema (ICD10-J43.9). Electronically Signed   By: Ilona Sorrel M.D.   On: 01/29/2018 11:34    Recent Labs: Lab Results  Component Value Date   WBC 4.3 02/08/2018   HGB 10.4 (L) 02/08/2018   PLT 264 02/08/2018   NA 141 02/08/2018   K 4.0 02/08/2018   CL 109 02/08/2018   CO2 25 02/08/2018   GLUCOSE 97 02/08/2018   BUN 7 (L) 02/08/2018   CREATININE 0.66 02/08/2018   BILITOT 0.3 02/08/2018   ALKPHOS 80 02/08/2018   AST 37 02/08/2018   ALT 28 02/08/2018   PROT 7.2 02/08/2018   ALBUMIN 3.7 02/08/2018   CALCIUM 9.5 02/08/2018   GFRAA >60 02/08/2018    Speciality Comments: PLQ eye exam: 04/29/2017 Normal. [facility]. Follow up in 12 months.  Procedures:  No procedures performed Allergies: Patient has no known allergies.   Assessment / Plan:     Visit Diagnoses: Rheumatoid arthritis involving multiple sites with positive rheumatoid factor (HCC) - +RF, +anti-CCP, +ANA with nodulosis: She has no synovitis on exam.  She has no joint pain, joint swelling, joint stiffness at this time.  She has not had any recent rheumatoid arthritis flares.  She has been taking Plaquenil 200 mg 1 tablet twice daily Monday through Friday.  She has been tolerating it well.  She will continue on his current treatment regimen.  She will follow-up in the office in 5 months.  She was advised to notify us if she develops increased joint pain or joint swelling.  High risk medication use - PLQ 200 mg BID M-F. eye exam: 04/29/2017.  CBC and CMP are drawn on 02/08/2018.  Vitamin D deficiency: Vitamin D was 22 on 07/24/17.  Patient requested for her vitamin D to be rechecked today.  DDD (degenerative disc disease), cervical: She has good ROM with no discomfort.  No symptoms of radiculopathy  at this time.   Other medical conditions are listed as follows:   Primary malignant neoplasm of bronchus of right lower lobe ([place])  Adenocarcinoma of right lung, stage 3 ([place])  Orders: Orders Placed This Encounter  Procedures  . VITAMIN D 25 Hydroxy (Vit-D Deficiency, Fractures)   No orders of the defined types were placed in this encounter.    Follow-Up Instructions: Return in about 5 months (around 07/20/2018) for Rheumatoid arthritis.   Ofilia Neas, PA-C   I examined and evaluated the patient with Susan Sams PA.  Patient had no synovitis on examination.  She is doing well on Plaquenil.  She is concerned about her history of vitamin D deficiency.  We will check vitamin D level today.  I offered bone density evaluation.  She would like to postpone for right now.  The plan of care was discussed as noted above.  Bo Merino, MD Note - This record has been created using Editor, commissioning.  Chart creation errors have been sought, but may not always  have been located. Such creation errors do not reflect on  the standard of medical care.

## 2018-02-15 ENCOUNTER — Other Ambulatory Visit: Payer: Self-pay

## 2018-02-15 ENCOUNTER — Ambulatory Visit
Admission: RE | Admit: 2018-02-15 | Discharge: 2018-02-15 | Disposition: A | Discharge status: Home / Self Care | Payer: BC Managed Care – PPO | Source: Ambulatory Visit | Attending: Radiation Oncology | Admitting: Radiation Oncology

## 2018-02-15 ENCOUNTER — Encounter: Payer: Self-pay | Admitting: Radiation Oncology

## 2018-02-15 VITALS — BP 118/67 | HR 118 | Temp 98.6°F | Resp 20 | Ht 60.0 in | Wt 115.0 lb

## 2018-02-15 DIAGNOSIS — Z7982 Long term (current) use of aspirin: Secondary | ICD-10-CM | POA: Insufficient documentation

## 2018-02-15 DIAGNOSIS — C3491 Malignant neoplasm of unspecified part of right bronchus or lung: Secondary | ICD-10-CM

## 2018-02-15 DIAGNOSIS — Z79899 Other long term (current) drug therapy: Secondary | ICD-10-CM | POA: Insufficient documentation

## 2018-02-15 DIAGNOSIS — Z923 Personal history of irradiation: Secondary | ICD-10-CM | POA: Insufficient documentation

## 2018-02-15 DIAGNOSIS — C3431 Malignant neoplasm of lower lobe, right bronchus or lung: Secondary | ICD-10-CM | POA: Diagnosis not present

## 2018-02-15 MED ORDER — BENZONATATE 100 MG PO CAPS: 100.0000 mg | ORAL_CAPSULE | Freq: Three times a day (TID) | ORAL | 0 refills | Status: DC | PRN

## 2018-02-15 NOTE — Progress Notes (Signed)
Radiation Oncology         [phone] ________________________________  Name: Susan Holder MRN: [id number]  Date of Service: 02/15/2018 DOB: 13-Jan-1956  Post Treatment Note  CC: Patient, No Pcp Per  Curt Bears, MD  Diagnosis:  Stage IIIA, cT3N1-2M0 NSCLC, adenocarcinoma of the right lower lobe.   Interval Since Last Radiation:  5 weeks   11/30/17-01/11/18: 66 Gy to the chest and regional nodes over 33 fractions  Narrative:  The patient returns today for routine follow-up.  She tolerated treatment but did have significant esophagitis.                       On review of systems, the patient states she is doing much better. She reports that since her bronchoscopy she has had some trouble with hoarseness. She reports this has not lessened since her procedure. Her skin is healing well and she reports it is almost completely resolved. She does have a chronic cough. No other complaints are noted.   ALLERGIES:  has No Known Allergies.  Meds: Current Outpatient Medications  Medication Sig Dispense Refill  . albuterol (PROVENTIL HFA) 108 (90 Base) MCG/ACT inhaler Inhale 1 puff into the lungs every 6 (six) hours as needed for wheezing or shortness of breath.     Marland Kitchen aspirin EC 81 MG tablet Take 81 mg by mouth daily.    . cetirizine (ZYRTEC) 10 MG tablet Take by mouth.    . famotidine (PEPCID) 40 MG tablet Take 40 mg by mouth daily.    . hydroxychloroquine (PLAQUENIL) 200 MG tablet TAKE 1 TABLET BY MOUTH TWICE DAILY MONDAY AND FRIDAY 40 tablet 0  . lidocaine (XYLOCAINE) 2 % solution Use as directed 10-15 mLs in the mouth or throat every 6 (six) hours as needed for mouth pain. 100 mL 2  . potassium chloride SA (K-DUR,KLOR-CON) 20 MEQ tablet Take 1 tablet (20 mEq total) by mouth 2 (two) times daily. For 10 days 20 tablet 0  . Probiotic Product (ALIGN PO) Take 1 capsule by mouth at bedtime.    . sucralfate (CARAFATE) 1 g tablet Take 1 tablet (1 g total) by mouth 4 (four) times daily. 120  tablet 2  . Vitamin D, Ergocalciferol, (DRISDOL) 50000 units CAPS capsule Take 1 capsule (50,000 Units total) by mouth every 30 (thirty) days. 3 capsule 1  . benzonatate (TESSALON) 100 MG capsule Take 1 capsule (100 mg total) by mouth 3 (three) times daily as needed for cough. 60 capsule 0  . naproxen sodium (ALEVE) 220 MG tablet Take 220 mg by mouth daily as needed (PAIN).    Marland Kitchen pantoprazole (PROTONIX) 40 MG tablet Take 1 tablet (40 mg total) by mouth at bedtime. (Patient not taking: Reported on 02/15/2018) 30 tablet 1  . prochlorperazine (COMPAZINE) 10 MG tablet Take 1 tablet (10 mg total) by mouth every 6 (six) hours as needed for nausea or vomiting. (Patient not taking: Reported on 02/15/2018) 30 tablet 0  . pseudoephedrine-guaifenesin (MUCINEX D) 60-600 MG 12 hr tablet Take 1 tablet by mouth every 12 (twelve) hours.    . traMADol (ULTRAM) 50 MG tablet Take 1 tablet (50 mg total) by mouth every 6 (six) hours as needed. (Patient not taking: Reported on 02/15/2018) 60 tablet 0   No current facility-administered medications for this encounter.     Physical Findings:  height is 5' (1.524 m) and weight is 115 lb (52.2 kg). Her oral temperature is 98.6 F (37 C). Her blood  pressure is 118/67 and her pulse is 118 (abnormal). Her respiration is 20 and oxygen saturation is 100%.  Pain Assessment Pain Score: 0-No pain/10 In general this is a well appearing African American female in no acute distress. She's alert and oriented x4 and appropriate throughout the examination. Cardiopulmonary assessment is negative for acute distress and she exhibits normal effort.   Lab Findings: Lab Results  Component Value Date   WBC 4.3 02/08/2018   HGB 10.4 (L) 02/08/2018   HCT 31.9 (L) 02/08/2018   MCV 96.7 02/08/2018   PLT 264 02/08/2018     Radiographic Findings: Ct Chest W Contrast  Result Date: 01/29/2018 CLINICAL DATA:  Stage IIIA right lower lobe lung adenocarcinoma diagnosed July 2019 status post  concurrent chemoradiation therapy. Restaging. EXAM: CT CHEST WITH CONTRAST TECHNIQUE: Multidetector CT imaging of the chest was performed during intravenous contrast administration. CONTRAST:  30mL OMNIPAQUE IOHEXOL 300 MG/ML  SOLN COMPARISON:  10/06/2017 PET-CT.  Outside chest CT from 09/29/2017. FINDINGS: Cardiovascular: Normal heart size. No significant pericardial effusion/thickening. Atherosclerotic nonaneurysmal thoracic aorta. Normal caliber pulmonary arteries. No central pulmonary emboli. Mediastinum/Nodes: Subcentimeter hypodense anterior right thyroid lobe nodule. Fluid level in the midthoracic esophagus, suggesting dysmotility and/or reflux. No axillary adenopathy. The subcarinal portion of the right infrahilar lung mass measures 1.2 cm in short axis diameter (series 2/image 63), mildly increased from 0.7 cm on 09/29/2017 outside chest CT. No new pathologically enlarged mediastinal or hilar nodes. Lungs/Pleura: No pneumothorax. No pleural effusion. Moderate centrilobular emphysema with mild diffuse bronchial wall thickening. Right infrahilar 3.8 x 3.0 cm lung mass (series 2/image 67), previously 4.5 x 3.3 cm on 09/29/2017 chest CT using similar measurement technique, mildly decreased. Stable narrowing of the bronchus intermedius and central right middle and right lower lobe bronchi. No acute consolidative airspace disease or new significant pulmonary nodules. Upper abdomen: No acute abnormality. Musculoskeletal: No aggressive appearing focal osseous lesions. Marked thoracic spondylosis. IMPRESSION: 1. Overall, the right infrahilar lung mass is mildly decreased in size. However, the portion of this mass that directly invades the subcarinal region has mildly increased. Stable narrowing of the bronchus intermedius and central right middle lobe/right lower lobe bronchi. 2. Otherwise no new or progressive metastatic disease in the chest. Aortic Atherosclerosis (ICD10-I70.0) and Emphysema (ICD10-J43.9).  Electronically Signed   By: Ilona Sorrel M.D.   On: 01/29/2018 11:34    Impression/Plan: 1. Stage IIIA, cT3N1-2M0 NSCLC, adenocarcinoma of the right lower lobe. The patient is doing well since completing her radiotherapy, her symptoms are improving as we would anticipate.  She is counseled on precautions for when to call if she were to have increasing shortness of breath fevers and productive cough.  She will continue her plans for consolidative immunotherapy with Dr. Julien Nordmann.  We will see her back as needed moving forward. 2. Hoarseness.  The patient reports that her hoarseness truly was following her bronchoscopy.  She does not see pulmonary for several more weeks, she is interested in learning about options for managing this and having resolution of this, and was agreeable to proceeding with referral to ear nose and throat.  She will cancel this if her symptoms improve in the interim. 3. Chronic cough secondary to #1.  The patient was given a prescription for [employer]  We will follow this expectantly.     Carola Rhine, PAC

## 2018-02-15 NOTE — Addendum Note (Signed)
Encounter addended by: Malena Edman, RN on: 02/15/2018 9:27 AM  Actions taken: Visit Navigator Flowsheet section accepted

## 2018-02-18 ENCOUNTER — Encounter: Payer: Self-pay | Admitting: Rheumatology

## 2018-02-18 ENCOUNTER — Ambulatory Visit: Payer: BC Managed Care – PPO | Admitting: Rheumatology

## 2018-02-18 ENCOUNTER — Telehealth: Payer: Self-pay | Admitting: Rheumatology

## 2018-02-18 VITALS — BP 105/74 | HR 107 | Resp 12 | Ht 60.0 in | Wt 116.8 lb

## 2018-02-18 DIAGNOSIS — M5030 Other cervical disc degeneration, unspecified cervical region: Secondary | ICD-10-CM | POA: Diagnosis not present

## 2018-02-18 DIAGNOSIS — M0579 Rheumatoid arthritis with rheumatoid factor of multiple sites without organ or systems involvement: Secondary | ICD-10-CM | POA: Diagnosis not present

## 2018-02-18 DIAGNOSIS — Z79899 Other long term (current) drug therapy: Secondary | ICD-10-CM

## 2018-02-18 DIAGNOSIS — C3431 Malignant neoplasm of lower lobe, right bronchus or lung: Secondary | ICD-10-CM | POA: Diagnosis not present

## 2018-02-18 DIAGNOSIS — C3491 Malignant neoplasm of unspecified part of right bronchus or lung: Secondary | ICD-10-CM

## 2018-02-18 DIAGNOSIS — E559 Vitamin D deficiency, unspecified: Secondary | ICD-10-CM

## 2018-02-18 MED ORDER — HYDROXYCHLOROQUINE SULFATE 200 MG PO TABS: ORAL_TABLET | ORAL | 2 refills | Status: DC

## 2018-02-18 NOTE — Telephone Encounter (Signed)
Refill was sent at patient's appointment today.

## 2018-02-18 NOTE — Telephone Encounter (Signed)
Patient will need refill on Plaquenil in a few weeks. Patient request rx be sent to [facility] on [address].

## 2018-02-19 LAB — VITAMIN D 25 HYDROXY (VIT D DEFICIENCY, FRACTURES): Vit D, 25-Hydroxy: 22 ng/mL — ABNORMAL LOW (ref 30–100)

## 2018-02-22 ENCOUNTER — Inpatient Hospital Stay: Payer: BC Managed Care – PPO

## 2018-02-22 ENCOUNTER — Inpatient Hospital Stay (HOSPITAL_BASED_OUTPATIENT_CLINIC_OR_DEPARTMENT_OTHER): Payer: BC Managed Care – PPO | Admitting: Oncology

## 2018-02-22 ENCOUNTER — Encounter: Payer: Self-pay | Admitting: Oncology

## 2018-02-22 ENCOUNTER — Telehealth: Payer: Self-pay | Admitting: Oncology

## 2018-02-22 VITALS — HR 106

## 2018-02-22 VITALS — BP 138/75 | HR 108 | Temp 99.0°F | Resp 18 | Ht 60.0 in | Wt 115.9 lb

## 2018-02-22 DIAGNOSIS — C3491 Malignant neoplasm of unspecified part of right bronchus or lung: Secondary | ICD-10-CM

## 2018-02-22 DIAGNOSIS — Z5112 Encounter for antineoplastic immunotherapy: Secondary | ICD-10-CM | POA: Diagnosis not present

## 2018-02-22 DIAGNOSIS — C3431 Malignant neoplasm of lower lobe, right bronchus or lung: Secondary | ICD-10-CM | POA: Diagnosis not present

## 2018-02-22 DIAGNOSIS — R1310 Dysphagia, unspecified: Secondary | ICD-10-CM | POA: Diagnosis not present

## 2018-02-22 LAB — CBC WITH DIFFERENTIAL (CANCER CENTER ONLY)
Abs Immature Granulocytes: 0.02 10*3/uL (ref 0.00–0.07)
Basophils Absolute: 0 10*3/uL (ref 0.0–0.1)
Basophils Relative: 0 %
Eosinophils Absolute: 0.3 10*3/uL (ref 0.0–0.5)
Eosinophils Relative: 4 %
HCT: 32.1 % — ABNORMAL LOW (ref 36.0–46.0)
Hemoglobin: 10.3 g/dL — ABNORMAL LOW (ref 12.0–15.0)
Immature Granulocytes: 0 %
Lymphocytes Relative: 19 %
Lymphs Abs: 1.1 10*3/uL (ref 0.7–4.0)
MCH: 31.5 pg (ref 26.0–34.0)
MCHC: 32.1 g/dL (ref 30.0–36.0)
MCV: 98.2 fL (ref 80.0–100.0)
Monocytes Absolute: 0.7 10*3/uL (ref 0.1–1.0)
Monocytes Relative: 13 %
Neutro Abs: 3.6 10*3/uL (ref 1.7–7.7)
Neutrophils Relative %: 64 %
Platelet Count: 347 10*3/uL (ref 150–400)
RBC: 3.27 MIL/uL — ABNORMAL LOW (ref 3.87–5.11)
RDW: 17.4 % — ABNORMAL HIGH (ref 11.5–15.5)
WBC Count: 5.7 10*3/uL (ref 4.0–10.5)
nRBC: 0 % (ref 0.0–0.2)

## 2018-02-22 LAB — CMP (CANCER CENTER ONLY)
ALT: 18 U/L (ref 0–44)
AST: 31 U/L (ref 15–41)
Albumin: 3 g/dL — ABNORMAL LOW (ref 3.5–5.0)
Alkaline Phosphatase: 93 U/L (ref 38–126)
Anion gap: 9 (ref 5–15)
BUN: 6 mg/dL — ABNORMAL LOW (ref 8–23)
CO2: 24 mmol/L (ref 22–32)
Calcium: 9.4 mg/dL (ref 8.9–10.3)
Chloride: 104 mmol/L (ref 98–111)
Creatinine: 0.79 mg/dL (ref 0.44–1.00)
GFR, Est AFR Am: >60 mL/min (ref >60)
GFR, Estimated: >60 mL/min (ref >60)
Glucose, Bld: 107 mg/dL — ABNORMAL HIGH (ref 70–99)
Potassium: 3.7 mmol/L (ref 3.5–5.1)
Sodium: 137 mmol/L (ref 135–145)
Total Bilirubin: 0.3 mg/dL (ref 0.3–1.2)
Total Protein: 7.3 g/dL (ref 6.5–8.1)

## 2018-02-22 MED ORDER — SODIUM CHLORIDE 0.9 % IV SOLN
9.5000 mg/kg | Freq: Once | INTRAVENOUS | Status: AC
  Administered 2018-02-22: 500 mg via INTRAVENOUS
  Filled 2018-02-22: qty 10

## 2018-02-22 MED ORDER — SODIUM CHLORIDE 0.9 % IV SOLN
Freq: Once | INTRAVENOUS | Status: AC
  Administered 2018-02-22: 14:00:00 via INTRAVENOUS
  Filled 2018-02-22: qty 250

## 2018-02-22 NOTE — Progress Notes (Signed)
Per Mikey Bussing, NP, ok to treat with HR of 106.

## 2018-02-22 NOTE — Assessment & Plan Note (Signed)
This is a very pleasant 62 year old African-American female recently diagnosed with a stage IIIA non-small cell lung cancer, adenocarcinoma.  She is currently undergoing a course of concurrent chemoradiation with weekly carboplatin and paclitaxel status post 7 cycles.   The patient tolerated this course of treatment well except for mild odynophagia and dysphagia. She is now receiving a course of consolidation treatment with immunotherapy with Imfinzi (Durvalumab) 10 mg/KG every 2 weeks.  Status post 1 cycle.  She tolerated the first cycle fairly well with no concerning complaints.  Recommend for her to proceed with cycle 2 as scheduled today.  For cough, I have recommend that she try Delsym as needed.  She will follow-up in 2 weeks for evaluation prior to cycle #3 of her treatment.   She was advised to call immediately if she has any concerning symptoms in the interval. The patient voices understanding of current disease status and treatment options and is in agreement with the current care plan.  All questions were answered. The patient knows to call the clinic with any problems, questions or concerns. We can certainly see the patient much sooner if necessary.

## 2018-02-22 NOTE — Progress Notes (Signed)
Vitamin D is low.  Please send in vitamin D 50,000 units by mouth once weekly.  Recheck labs in 3 months.

## 2018-02-22 NOTE — Telephone Encounter (Signed)
Scheduled appt per 10/28 los - pt to get an updated schedule next visit.

## 2018-02-22 NOTE — Patient Instructions (Signed)
Durvalumab injection  What is this medicine?  DURVALUMAB (dur VAL ue mab) is a monoclonal antibody. It is used to treat urothelial cancer.  This medicine may be used for other purposes; ask your health care provider or pharmacist if you have questions.  COMMON BRAND NAME(S): IMFINZI  What should I tell my health care provider before I take this medicine?  They need to know if you have any of these conditions:  -diabetes  -immune system problems  -infection  -inflammatory bowel disease  -kidney disease  -liver disease  -lung or breathing disease  -lupus  -organ transplant  -stomach or intestine problems  -thyroid disease  -an unusual or allergic reaction to durvalumab, other medicines, foods, dyes, or preservatives  -pregnant or trying to get pregnant  -breast-feeding  How should I use this medicine?  This medicine is for infusion into a vein. It is given by a health care professional in a hospital or clinic setting.  A special MedGuide will be given to you before each treatment. Be sure to read this information carefully each time.  Talk to your pediatrician regarding the use of this medicine in children. Special care may be needed.  Overdosage: If you think you have taken too much of this medicine contact a poison control center or emergency room at once.  NOTE: This medicine is only for you. Do not share this medicine with others.  What if I miss a dose?  It is important not to miss your dose. Call your doctor or health care professional if you are unable to keep an appointment.  What may interact with this medicine?  Interactions have not been studied.  This list may not describe all possible interactions. Give your health care provider a list of all the medicines, herbs, non-prescription drugs, or dietary supplements you use. Also tell them if you smoke, drink alcohol, or use illegal drugs. Some items may interact with your medicine.  What should I watch for while using this medicine?  This drug may make you  feel generally unwell. Continue your course of treatment even though you feel ill unless your doctor tells you to stop.  You may need blood work done while you are taking this medicine.  Do not become pregnant while taking this medicine or for 3 months after stopping it. Women should inform their doctor if they wish to become pregnant or think they might be pregnant. There is a potential for serious side effects to an unborn child. Talk to your health care professional or pharmacist for more information. Do not breast-feed an infant while taking this medicine or for 3 months after stopping it.  What side effects may I notice from receiving this medicine?  Side effects that you should report to your doctor or health care professional as soon as possible:  -allergic reactions like skin rash, itching or hives, swelling of the face, lips, or tongue  -black, tarry stools  -bloody or watery diarrhea  -breathing problems  -change in emotions or moods  -change in sex drive  -changes in vision  -chest pain or chest tightness  -chills  -confusion  -cough  -facial flushing  -fever  -headache  -signs and symptoms of high blood sugar such as dizziness; dry mouth; dry skin; fruity breath; nausea; stomach pain; increased hunger or thirst; increased urination  -signs and symptoms of liver injury like dark yellow or brown urine; general ill feeling or flu-like symptoms; light-colored stools; loss of appetite; nausea; right upper belly pain;   unusually weak or tired; yellowing of the eyes or skin  -stomach pain  -trouble passing urine or change in the amount of urine  -weight gain or weight loss  Side effects that usually do not require medical attention (report these to your doctor or health care professional if they continue or are bothersome):  -bone pain  -constipation  -loss of appetite  -muscle pain  -nausea  -swelling of the ankles, feet, hands  -tiredness  This list may not describe all possible side effects. Call your doctor  for medical advice about side effects. You may report side effects to FDA at 1-800-FDA-1088.  Where should I keep my medicine?  This drug is given in a hospital or clinic and will not be stored at home.  NOTE: This sheet is a summary. It may not cover all possible information. If you have questions about this medicine, talk to your doctor, pharmacist, or health care provider.  © 2018 Elsevier/Gold Standard (2015-11-16 15:50:36)

## 2018-02-22 NOTE — Progress Notes (Signed)
Susan Holder  Patient, No Pcp Per No address on file  DIAGNOSIS: Stage IIIA (T3,N1/N2, M0)non-small cell lung cancer, adenocarcinoma presented with large right lower lobe lung mass with extension to the right hilum and subcarinal area diagnosed in July 2019.  Biomarker Findings Tumor Mutational Burden - TMB-Intermediate (6 Muts/Mb) Microsatellite status - MS-Stable Genomic Findings For a complete list of the genes assayed, please refer to the Appendix. NRAS Q61R ARAF amplification STK11 G56W KRAS G13D MYCN amplification MCL1 amplification NKX2-1 amplification - equivocal? TP53 G245V 7 Disease relevant genes with no reportable alterations: EGFR, ALK, BRAF, MET, ERBB2, RET, ROS1   PRIOR THERAPY: Course of concurrent chemoradiation with weekly carboplatin for AUC of 2 and paclitaxel 45 mg/M2.  Status post 7 cycles.  Last dose was giving 01/11/2018.  CURRENT THERAPY: Consolidation treatment with immunotherapy with Imfinzi (Durvalumab) 10 mg/KG every 2 weeks.  First dose given on 02/08/2018.  Status post 1 cycle.  INTERVAL HISTORY: Susan Holder 62 y.o. female returns for routine follow-up visit by herself.  The patient is feeling fine today and has no specific complaints except for ongoing nonproductive cough.  She reports that her cough is not worsened.  She was given a prescription for Ladona Ridgel recently which is not helping.  She denies fevers and chills.  Denies chest pain, shortness of breath, hemoptysis.  Denies nausea, vomiting, constipation, diarrhea.  Denies recent weight loss or night sweats.  She tolerated the first cycle of Imfinzi fairly well.  The patient is here for evaluation prior to cycle #2 of her Imfinzi.  MEDICAL HISTORY: Past Medical History:  Diagnosis Date  . Rheumatoid arthritis ([place])     ALLERGIES:  has No Known Allergies.  MEDICATIONS:  Current Outpatient Medications  Medication Sig Dispense Refill  .  albuterol (PROVENTIL HFA) 108 (90 Base) MCG/ACT inhaler Inhale 1 puff into the lungs every 6 (six) hours as needed for wheezing or shortness of breath.     Marland Kitchen aspirin EC 81 MG tablet Take 81 mg by mouth daily.    . famotidine (PEPCID) 40 MG tablet Take 40 mg by mouth daily.    . hydroxychloroquine (PLAQUENIL) 200 MG tablet TAKE 1 TABLET BY MOUTH TWICE DAILY Monday THROUGH FRIDAY 40 tablet 2  . naproxen sodium (ALEVE) 220 MG tablet Take 220 mg by mouth daily as needed (PAIN).    . Probiotic Product (ALIGN PO) Take 1 capsule by mouth at bedtime.    . pseudoephedrine-guaifenesin (MUCINEX D) 60-600 MG 12 hr tablet Take 1 tablet by mouth as needed.     . benzonatate (TESSALON) 100 MG capsule Take 1 capsule (100 mg total) by mouth 3 (three) times daily as needed for cough. (Patient not taking: Reported on 02/22/2018) 60 capsule 0   No current facility-administered medications for this visit.     SURGICAL HISTORY:  Past Surgical History:  Procedure Laterality Date  . BACK SURGERY    . BRONCHIAL NEEDLE ASPIRATION BIOPSY  11/09/2017   Procedure: BRONCHIAL NEEDLE ASPIRATION BIOPSIES;  Surgeon: Marshell Garfinkel, MD;  Location: [facility];  Service: Cardiopulmonary;;  . ENDOBRONCHIAL ULTRASOUND Bilateral 11/09/2017   Procedure: ENDOBRONCHIAL ULTRASOUND;  Surgeon: Marshell Garfinkel, MD;  Location: [facility];  Service: Cardiopulmonary;  Laterality: Bilateral;  . NASAL SINUS SURGERY    . NASAL SINUS SURGERY    . TUBAL LIGATION    . VIDEO BRONCHOSCOPY  11/09/2017   Procedure: VIDEO BRONCHOSCOPY;  Surgeon: Marshell Garfinkel, MD;  Location: [facility];  Service:  Cardiopulmonary;;    REVIEW OF SYSTEMS:   Review of Systems  Constitutional: Negative for appetite change, chills, fatigue, fever and unexpected weight change.  HENT:   Negative for mouth sores, nosebleeds, sore throat and trouble swallowing.   Eyes: Negative for eye problems and icterus.  Respiratory: Negative for hemoptysis, shortness of breath  and wheezing.  Positive for nonproductive cough. Cardiovascular: Negative for chest pain and leg swelling.  Gastrointestinal: Negative for abdominal pain, constipation, diarrhea, nausea and vomiting.  Genitourinary: Negative for bladder incontinence, difficulty urinating, dysuria, frequency and hematuria.   Musculoskeletal: Negative for back pain, gait problem, neck pain and neck stiffness.  Skin: Negative for itching and rash.  Neurological: Negative for dizziness, extremity weakness, gait problem, headaches, light-headedness and seizures.  Hematological: Negative for adenopathy. Does not bruise/bleed easily.  Psychiatric/Behavioral: Negative for confusion, depression and sleep disturbance. The patient is not nervous/anxious.     PHYSICAL EXAMINATION:  Blood pressure 138/75, pulse (!) 108, temperature 99 F (37.2 C), temperature source Oral, resp. rate 18, height 5' (1.524 m), weight 115 lb 14.4 oz (52.6 kg), last menstrual period 04/28/2000, SpO2 97 %.  ECOG PERFORMANCE STATUS: 1 - Symptomatic but completely ambulatory  Physical Exam  Constitutional: Oriented to person, place, and time and well-developed, well-nourished, and in no distress. No distress.  HENT:  Head: Normocephalic and atraumatic.  Mouth/Throat: Oropharynx is clear and moist. No oropharyngeal exudate.  Eyes: Conjunctivae are normal. Right eye exhibits no discharge. Left eye exhibits no discharge. No scleral icterus.  Neck: Normal range of motion. Neck supple.  Cardiovascular: Normal rate, regular rhythm, normal heart sounds and intact distal pulses.   Pulmonary/Chest: Effort normal and breath sounds normal. No respiratory distress. No wheezes. No rales.  Abdominal: Soft. Bowel sounds are normal. Exhibits no distension and no mass. There is no tenderness.  Musculoskeletal: Normal range of motion. Exhibits no edema.  Lymphadenopathy:    No cervical adenopathy.  Neurological: Alert and oriented to person, place, and time.  Exhibits normal muscle tone. Gait normal. Coordination normal.  Skin: Skin is warm and dry. No rash noted. Not diaphoretic. No erythema. No pallor.  Psychiatric: Mood, memory and judgment normal.  Vitals reviewed.  LABORATORY DATA: Lab Results  Component Value Date   WBC 5.7 02/22/2018   HGB 10.3 (L) 02/22/2018   HCT 32.1 (L) 02/22/2018   MCV 98.2 02/22/2018   PLT 347 02/22/2018      Chemistry      Component Value Date/Time   NA 137 02/22/2018 1225   K 3.7 02/22/2018 1225   CL 104 02/22/2018 1225   CO2 24 02/22/2018 1225   BUN 6 (L) 02/22/2018 1225   CREATININE 0.79 02/22/2018 1225   CREATININE 0.80 07/24/2017 1438      Component Value Date/Time   CALCIUM 9.4 02/22/2018 1225   ALKPHOS 93 02/22/2018 1225   AST 31 02/22/2018 1225   ALT 18 02/22/2018 1225   BILITOT 0.3 02/22/2018 1225       RADIOGRAPHIC STUDIES:  Ct Chest W Contrast  Result Date: 01/29/2018 CLINICAL DATA:  Stage IIIA right lower lobe lung adenocarcinoma diagnosed July 2019 status post concurrent chemoradiation therapy. Restaging. EXAM: CT CHEST WITH CONTRAST TECHNIQUE: Multidetector CT imaging of the chest was performed during intravenous contrast administration. CONTRAST:  72m OMNIPAQUE IOHEXOL 300 MG/ML  SOLN COMPARISON:  10/06/2017 PET-CT.  Outside chest CT from 09/29/2017. FINDINGS: Cardiovascular: Normal heart size. No significant pericardial effusion/thickening. Atherosclerotic nonaneurysmal thoracic aorta. Normal caliber pulmonary arteries. No central pulmonary emboli.  Mediastinum/Nodes: Subcentimeter hypodense anterior right thyroid lobe nodule. Fluid level in the midthoracic esophagus, suggesting dysmotility and/or reflux. No axillary adenopathy. The subcarinal portion of the right infrahilar lung mass measures 1.2 cm in short axis diameter (series 2/image 63), mildly increased from 0.7 cm on 09/29/2017 outside chest CT. No new pathologically enlarged mediastinal or hilar nodes. Lungs/Pleura: No  pneumothorax. No pleural effusion. Moderate centrilobular emphysema with mild diffuse bronchial wall thickening. Right infrahilar 3.8 x 3.0 cm lung mass (series 2/image 67), previously 4.5 x 3.3 cm on 09/29/2017 chest CT using similar measurement technique, mildly decreased. Stable narrowing of the bronchus intermedius and central right middle and right lower lobe bronchi. No acute consolidative airspace disease or new significant pulmonary nodules. Upper abdomen: No acute abnormality. Musculoskeletal: No aggressive appearing focal osseous lesions. Marked thoracic spondylosis. IMPRESSION: 1. Overall, the right infrahilar lung mass is mildly decreased in size. However, the portion of this mass that directly invades the subcarinal region has mildly increased. Stable narrowing of the bronchus intermedius and central right middle lobe/right lower lobe bronchi. 2. Otherwise no new or progressive metastatic disease in the chest. Aortic Atherosclerosis (ICD10-I70.0) and Emphysema (ICD10-J43.9). Electronically Signed   By: Ilona Sorrel M.D.   On: 01/29/2018 11:34     ASSESSMENT/PLAN:  Adenocarcinoma of right lung, stage 3 (HCC) This is a very pleasant 62 year old African-American female recently diagnosed with a stage IIIA non-small cell lung cancer, adenocarcinoma.  She is currently undergoing a course of concurrent chemoradiation with weekly carboplatin and paclitaxel status post 7 cycles.   The patient tolerated this course of treatment well except for mild odynophagia and dysphagia. She is now receiving a course of consolidation treatment with immunotherapy with Imfinzi (Durvalumab) 10 mg/KG every 2 weeks.  Status post 1 cycle.  She tolerated the first cycle fairly well with no concerning complaints.  Recommend for her to proceed with cycle 2 as scheduled today.  For cough, I have recommend that she try Delsym as needed.  She will follow-up in 2 weeks for evaluation prior to cycle #3 of her treatment.    She was advised to call immediately if she has any concerning symptoms in the interval. The patient voices understanding of current disease status and treatment options and is in agreement with the current care plan.  All questions were answered. The patient knows to call the clinic with any problems, questions or concerns. We can certainly see the patient much sooner if necessary.   No orders of the defined types were placed in this encounter.    Mikey Bussing, DNP, AGPCNP-BC, AOCNP 02/22/18

## 2018-02-26 ENCOUNTER — Encounter: Payer: Self-pay | Admitting: Radiation Oncology

## 2018-02-26 NOTE — Progress Notes (Signed)
  Radiation Oncology         [phone]) [phone] ________________________________  Name: Susan Holder MRN: [id number]  Date: 02/26/2018  DOB: 10/29/1955  End of Treatment Note  Diagnosis:  Lung cancer     Indication for treatment::  curative       Radiation treatment dates:   11/30/2017 - 01/14/2018  Site/dose:   The patient was treated to the disease within the right lung initially to a dose of 60 Gy using a 4 field, 3-D conformal technique. The patient then received a cone down boost treatment for an additional 6 Gy. This yielded a final total dose of 66 Gy.   Narrative: The patient tolerated radiation treatment relatively well. The patient did experience esophagitis during the course of treatment which required management. She experienced significant painful and difficulty swallowing.  Plan: The patient has completed radiation treatment. The patient will return to radiation oncology clinic for routine followup in one month. I advised the patient to call or return sooner if they have any questions or concerns related to their recovery or treatment. ________________________________  Jodelle Gross, M.D., Ph.D.  This document serves as a record of services personally performed by Kyung Rudd, MD. It was created on his behalf by Wilburn Mylar, a trained medical scribe. The creation of this record is based on the scribe's personal observations and the provider's statements to them. This document has been checked and approved by the attending provider.

## 2018-03-04 ENCOUNTER — Telehealth: Payer: Self-pay | Admitting: *Deleted

## 2018-03-04 DIAGNOSIS — E559 Vitamin D deficiency, unspecified: Secondary | ICD-10-CM

## 2018-03-04 MED ORDER — VITAMIN D (ERGOCALCIFEROL) 1.25 MG (50000 UNIT) PO CAPS: 50000.0000 [IU] | ORAL_CAPSULE | ORAL | 0 refills | Status: DC

## 2018-03-04 NOTE — Telephone Encounter (Signed)
-----   Message from Ofilia Neas, PA-C sent at 02/22/2018  8:10 AM EDT ----- Vitamin D is low.  Please send in vitamin D 50,000 units by mouth once weekly.  Recheck labs in 3 months.

## 2018-03-08 ENCOUNTER — Inpatient Hospital Stay (HOSPITAL_BASED_OUTPATIENT_CLINIC_OR_DEPARTMENT_OTHER): Payer: BC Managed Care – PPO | Admitting: Oncology

## 2018-03-08 ENCOUNTER — Encounter: Payer: Self-pay | Admitting: Oncology

## 2018-03-08 ENCOUNTER — Telehealth: Payer: Self-pay | Admitting: Oncology

## 2018-03-08 ENCOUNTER — Ambulatory Visit (INDEPENDENT_AMBULATORY_CARE_PROVIDER_SITE_OTHER): Payer: BC Managed Care – PPO | Admitting: Pulmonary Disease

## 2018-03-08 ENCOUNTER — Inpatient Hospital Stay: Payer: BC Managed Care – PPO

## 2018-03-08 ENCOUNTER — Inpatient Hospital Stay: Payer: BC Managed Care – PPO | Attending: Internal Medicine

## 2018-03-08 ENCOUNTER — Encounter: Payer: Self-pay | Admitting: Pulmonary Disease

## 2018-03-08 ENCOUNTER — Ambulatory Visit: Payer: BC Managed Care – PPO | Admitting: Pulmonary Disease

## 2018-03-08 VITALS — HR 94

## 2018-03-08 VITALS — BP 132/78 | HR 74 | Ht <= 58 in | Wt 111.0 lb

## 2018-03-08 VITALS — BP 142/84 | HR 104 | Temp 98.8°F | Resp 18 | Ht <= 58 in | Wt 111.9 lb

## 2018-03-08 DIAGNOSIS — R1310 Dysphagia, unspecified: Secondary | ICD-10-CM | POA: Diagnosis not present

## 2018-03-08 DIAGNOSIS — C3491 Malignant neoplasm of unspecified part of right bronchus or lung: Secondary | ICD-10-CM

## 2018-03-08 DIAGNOSIS — Z23 Encounter for immunization: Secondary | ICD-10-CM | POA: Diagnosis not present

## 2018-03-08 DIAGNOSIS — C3490 Malignant neoplasm of unspecified part of unspecified bronchus or lung: Secondary | ICD-10-CM

## 2018-03-08 DIAGNOSIS — C3431 Malignant neoplasm of lower lobe, right bronchus or lung: Secondary | ICD-10-CM | POA: Insufficient documentation

## 2018-03-08 DIAGNOSIS — J309 Allergic rhinitis, unspecified: Secondary | ICD-10-CM

## 2018-03-08 DIAGNOSIS — Z5112 Encounter for antineoplastic immunotherapy: Secondary | ICD-10-CM | POA: Diagnosis present

## 2018-03-08 DIAGNOSIS — Z79899 Other long term (current) drug therapy: Secondary | ICD-10-CM | POA: Diagnosis not present

## 2018-03-08 LAB — PULMONARY FUNCTION TEST
FEF 25-75 Post: 0.93 L/sec
FEF 25-75 Pre: 1.05 L/sec
FEF2575-%Change-Post: -11 %
FEF2575-%Pred-Post: 61 %
FEF2575-%Pred-Pre: 69 %
FEV1-%Change-Post: -3 %
FEV1-%Pred-Post: 95 %
FEV1-%Pred-Pre: 99 %
FEV1-Post: 1.42 L
FEV1-Pre: 1.47 L
FEV1FVC-%Change-Post: 1 %
FEV1FVC-%Pred-Pre: 93 %
FEV6-%Change-Post: -3 %
FEV6-%Pred-Post: 104 %
FEV6-%Pred-Pre: 108 %
FEV6-Post: 1.91 L
FEV6-Pre: 1.98 L
FEV6FVC-%Change-Post: 0 %
FEV6FVC-%Pred-Post: 104 %
FEV6FVC-%Pred-Pre: 103 %
FVC-%Change-Post: -4 %
FVC-%Pred-Post: 99 %
FVC-%Pred-Pre: 104 %
FVC-Post: 1.91 L
FVC-Pre: 2 L
Post FEV1/FVC ratio: 74 %
Post FEV6/FVC ratio: 100 %
Pre FEV1/FVC ratio: 73 %
Pre FEV6/FVC Ratio: 99 %
RV % pred: 61 %
RV: 1.1 L
TLC % pred: 65 %
TLC: 2.74 L

## 2018-03-08 LAB — TSH: TSH: 0.312 u[IU]/mL (ref 0.308–3.960)

## 2018-03-08 LAB — CBC WITH DIFFERENTIAL (CANCER CENTER ONLY)
Abs Immature Granulocytes: 0.06 10*3/uL (ref 0.00–0.07)
Basophils Absolute: 0 10*3/uL (ref 0.0–0.1)
Basophils Relative: 0 %
Eosinophils Absolute: 0.2 10*3/uL (ref 0.0–0.5)
Eosinophils Relative: 2 %
HCT: 33.3 % — ABNORMAL LOW (ref 36.0–46.0)
Hemoglobin: 10.7 g/dL — ABNORMAL LOW (ref 12.0–15.0)
Immature Granulocytes: 1 %
Lymphocytes Relative: 13 %
Lymphs Abs: 1.3 10*3/uL (ref 0.7–4.0)
MCH: 31 pg (ref 26.0–34.0)
MCHC: 32.1 g/dL (ref 30.0–36.0)
MCV: 96.5 fL (ref 80.0–100.0)
Monocytes Absolute: 1.3 10*3/uL — ABNORMAL HIGH (ref 0.1–1.0)
Monocytes Relative: 14 %
Neutro Abs: 6.6 10*3/uL (ref 1.7–7.7)
Neutrophils Relative %: 70 %
Platelet Count: 410 10*3/uL — ABNORMAL HIGH (ref 150–400)
RBC: 3.45 MIL/uL — ABNORMAL LOW (ref 3.87–5.11)
RDW: 16 % — ABNORMAL HIGH (ref 11.5–15.5)
WBC Count: 9.4 10*3/uL (ref 4.0–10.5)
nRBC: 0 % (ref 0.0–0.2)

## 2018-03-08 LAB — CMP (CANCER CENTER ONLY)
ALT: 12 U/L (ref 0–44)
AST: 24 U/L (ref 15–41)
Albumin: 2.9 g/dL — ABNORMAL LOW (ref 3.5–5.0)
Alkaline Phosphatase: 77 U/L (ref 38–126)
Anion gap: 11 (ref 5–15)
BUN: 7 mg/dL — ABNORMAL LOW (ref 8–23)
CO2: 25 mmol/L (ref 22–32)
Calcium: 9.7 mg/dL (ref 8.9–10.3)
Chloride: 101 mmol/L (ref 98–111)
Creatinine: 0.79 mg/dL (ref 0.44–1.00)
GFR, Est AFR Am: >60 mL/min (ref >60)
GFR, Estimated: >60 mL/min (ref >60)
Glucose, Bld: 94 mg/dL (ref 70–99)
Potassium: 3.6 mmol/L (ref 3.5–5.1)
Sodium: 137 mmol/L (ref 135–145)
Total Bilirubin: 0.2 mg/dL — ABNORMAL LOW (ref 0.3–1.2)
Total Protein: 7.5 g/dL (ref 6.5–8.1)

## 2018-03-08 MED ORDER — SODIUM CHLORIDE 0.9 % IV SOLN
9.5000 mg/kg | Freq: Once | INTRAVENOUS | Status: AC
  Administered 2018-03-08: 500 mg via INTRAVENOUS
  Filled 2018-03-08: qty 10

## 2018-03-08 MED ORDER — SODIUM CHLORIDE 0.9 % IV SOLN
Freq: Once | INTRAVENOUS | Status: AC
  Administered 2018-03-08: 15:00:00 via INTRAVENOUS
  Filled 2018-03-08: qty 250

## 2018-03-08 NOTE — Telephone Encounter (Signed)
3 cycles already scheduled per 11/11 los = no additional appts added at the moment .

## 2018-03-08 NOTE — Progress Notes (Signed)
[place] OFFICE PROGRESS NOTE  Patient, No Pcp Per No address on file  DIAGNOSIS:Stage IIIA (T3,N1/N2, M0)non-small cell lung cancer, adenocarcinoma presented with large right lower lobe lung mass with extension to the right hilum and subcarinal area diagnosed in July 2019.  Biomarker Findings Tumor Mutational Burden - TMB-Intermediate (6 Muts/Mb) Microsatellite status - MS-Stable Genomic Findings For a complete list of the genes assayed, please refer to the Appendix. NRAS Q61R ARAF amplification STK11 G56W KRAS G13D MYCN amplification MCL1 amplification NKX2-1 amplification - equivocal? TP53 G245V 7 Disease relevant genes with no reportable alterations: EGFR, ALK, BRAF, MET, ERBB2, RET, ROS1   PRIOR THERAPY: Course of concurrent chemoradiation with weekly carboplatin for AUC of 2 and paclitaxel 45 mg/M2. Status post 7cycles.Last dose was giving 01/11/2018.  CURRENT THERAPY:Consolidation treatment with immunotherapy with Imfinzi (Durvalumab) 10 mg/KG every 2 weeks.  First dose given on 02/08/2018.  Status post 2 cycles.  INTERVAL HISTORY: Susan Holder 62 y.o. female returns for routine follow-up visit by herself.  The patient is feeling fine today has no specific complaints except for ongoing nonproductive cough which is unchanged.  She noticed that she had more fatigue last week as well as some intermittent nausea and vomiting.  This is now all resolved.  She is unsure if she caught a virus or not.  She denies fevers and chills.  Denies chest pain, shortness of breath, and hemoptysis.  Denies nausea, vomiting, constipation, diarrhea today.  Denies recent weight loss or night sweats.  The patient is here for evaluation prior to cycle #3 of Imfinzi.  MEDICAL HISTORY: Past Medical History:  Diagnosis Date  . Rheumatoid arthritis ([place])     ALLERGIES:  has No Known Allergies.  MEDICATIONS:  Current Outpatient Medications  Medication Sig Dispense  Refill  . albuterol (PROVENTIL HFA) 108 (90 Base) MCG/ACT inhaler Inhale 1 puff into the lungs every 6 (six) hours as needed for wheezing or shortness of breath.     Marland Kitchen aspirin EC 81 MG tablet Take 81 mg by mouth daily.    . famotidine (PEPCID) 40 MG tablet Take 40 mg by mouth daily.    . hydroxychloroquine (PLAQUENIL) 200 MG tablet TAKE 1 TABLET BY MOUTH TWICE DAILY Monday THROUGH FRIDAY 40 tablet 2  . naproxen sodium (ALEVE) 220 MG tablet Take 220 mg by mouth daily as needed (PAIN).    . Probiotic Product (ALIGN PO) Take 1 capsule by mouth at bedtime.    . pseudoephedrine-guaifenesin (MUCINEX D) 60-600 MG 12 hr tablet Take 1 tablet by mouth as needed.     . Vitamin D, Ergocalciferol, (DRISDOL) 1.25 MG (50000 UT) CAPS capsule Take 1 capsule (50,000 Units total) by mouth every 7 (seven) days. 12 capsule 0   No current facility-administered medications for this visit.     SURGICAL HISTORY:  Past Surgical History:  Procedure Laterality Date  . BACK SURGERY    . BRONCHIAL NEEDLE ASPIRATION BIOPSY  11/09/2017   Procedure: BRONCHIAL NEEDLE ASPIRATION BIOPSIES;  Surgeon: Marshell Garfinkel, MD;  Location: [facility];  Service: Cardiopulmonary;;  . ENDOBRONCHIAL ULTRASOUND Bilateral 11/09/2017   Procedure: ENDOBRONCHIAL ULTRASOUND;  Surgeon: Marshell Garfinkel, MD;  Location: [facility];  Service: Cardiopulmonary;  Laterality: Bilateral;  . NASAL SINUS SURGERY    . NASAL SINUS SURGERY    . TUBAL LIGATION    . VIDEO BRONCHOSCOPY  11/09/2017   Procedure: VIDEO BRONCHOSCOPY;  Surgeon: Marshell Garfinkel, MD;  Location: [facility];  Service: Cardiopulmonary;;    REVIEW OF  SYSTEMS:   Review of Systems  Constitutional: Negative for appetite change, chills, fatigue, fever and unexpected weight change.  HENT:   Negative for mouth sores, nosebleeds, sore throat and trouble swallowing.   Eyes: Negative for eye problems and icterus.  Respiratory: Negative for hemoptysis, shortness of breath and wheezing.   Positive for cough.  Has noticed some improvement with Delsym. Cardiovascular: Negative for chest pain and leg swelling.  Gastrointestinal: Negative for abdominal pain, constipation, diarrhea, nausea and vomiting.  Genitourinary: Negative for bladder incontinence, difficulty urinating, dysuria, frequency and hematuria.   Musculoskeletal: Negative for back pain, gait problem, neck pain and neck stiffness.  Skin: Negative for itching and rash.  Neurological: Negative for dizziness, extremity weakness, gait problem, headaches, light-headedness and seizures.  Hematological: Negative for adenopathy. Does not bruise/bleed easily.  Psychiatric/Behavioral: Negative for confusion, depression and sleep disturbance. The patient is not nervous/anxious.     PHYSICAL EXAMINATION:  Blood pressure (!) 142/84, pulse (!) 104, temperature 98.8 F (37.1 C), temperature source Oral, resp. rate 18, height 4' 10"  (1.473 m), weight 111 lb 14.4 oz (50.8 kg), last menstrual period 04/28/2000, SpO2 95 %.  ECOG PERFORMANCE STATUS: 1 - Symptomatic but completely ambulatory  Physical Exam  Constitutional: Oriented to person, place, and time and well-developed, well-nourished, and in no distress. No distress.  HENT:  Head: Normocephalic and atraumatic.  Mouth/Throat: Oropharynx is clear and moist. No oropharyngeal exudate.  Eyes: Conjunctivae are normal. Right eye exhibits no discharge. Left eye exhibits no discharge. No scleral icterus.  Neck: Normal range of motion. Neck supple.  Cardiovascular: Normal rate, regular rhythm, normal heart sounds and intact distal pulses.   Pulmonary/Chest: Effort normal and breath sounds normal. No respiratory distress. No wheezes. No rales.  Abdominal: Soft. Bowel sounds are normal. Exhibits no distension and no mass. There is no tenderness.  Musculoskeletal: Normal range of motion. Exhibits no edema.  Lymphadenopathy:    No cervical adenopathy.  Neurological: Alert and oriented to  person, place, and time. Exhibits normal muscle tone. Gait normal. Coordination normal.  Skin: Skin is warm and dry. No rash noted. Not diaphoretic. No erythema. No pallor.  Psychiatric: Mood, memory and judgment normal.  Vitals reviewed.  LABORATORY DATA: Lab Results  Component Value Date   WBC 9.4 03/08/2018   HGB 10.7 (L) 03/08/2018   HCT 33.3 (L) 03/08/2018   MCV 96.5 03/08/2018   PLT 410 (H) 03/08/2018      Chemistry      Component Value Date/Time   NA 137 03/08/2018 1303   K 3.6 03/08/2018 1303   CL 101 03/08/2018 1303   CO2 25 03/08/2018 1303   BUN 7 (L) 03/08/2018 1303   CREATININE 0.79 03/08/2018 1303   CREATININE 0.80 07/24/2017 1438      Component Value Date/Time   CALCIUM 9.7 03/08/2018 1303   ALKPHOS 77 03/08/2018 1303   AST 24 03/08/2018 1303   ALT 12 03/08/2018 1303   BILITOT 0.2 (L) 03/08/2018 1303       RADIOGRAPHIC STUDIES:  No results found.   ASSESSMENT/PLAN:  Adenocarcinoma of right lung, stage 3 (HCC) This is a very pleasant 61 year old African-American female recently diagnosed with a stage IIIA non-small cell lung cancer, adenocarcinoma. She is currently undergoing a course of concurrent chemoradiation with weekly carboplatin and paclitaxel status post 7cycles.  The patient tolerated this course of treatment well except for mild odynophagia and dysphagia. She is now receiving a course of consolidation treatment with immunotherapy with Imfinzi (Durvalumab)  10 mg/KG every 2 weeks.  Status post 2 cycles.  She tolerated the first cycle fairly well with no concerning complaints.  She experience some nausea and vomiting 1 week after her last cycle which I suspect was related to her Imfinzi. Recommend for her to proceed with cycle 3 as scheduled today.  For cough, continue Delsym.  She will follow-up in 2 weeks for evaluation prior to cycle #4 of her treatment.   She was advised to call immediately if she has any concerning symptoms in the  interval. The patient voices understanding of current disease status and treatment options and is in agreement with the current care plan.  All questions were answered. The patient knows to call the clinic with any problems, questions or concerns. We can certainly see the patient much sooner if necessary.   No orders of the defined types were placed in this encounter.    Mikey Bussing, DNP, AGPCNP-BC, AOCNP 03/08/18

## 2018-03-08 NOTE — Assessment & Plan Note (Addendum)
This is a very pleasant 62 year old African-American female recently diagnosed with a stage IIIA non-small cell lung cancer, adenocarcinoma. She is currently undergoing a course of concurrent chemoradiation with weekly carboplatin and paclitaxel status post 7cycles.  The patient tolerated this course of treatment well except for mild odynophagia and dysphagia. She is now receiving a course of consolidation treatment with immunotherapy with Imfinzi (Durvalumab) 10 mg/KG every 2 weeks.  Status post 2 cycles.  She tolerated the first cycle fairly well with no concerning complaints.  She experience some nausea and vomiting 1 week after her last cycle which I suspect was related to her Imfinzi. Recommend for her to proceed with cycle 3 as scheduled today.  For cough, continue Delsym.  She will follow-up in 2 weeks for evaluation prior to cycle #4 of her treatment.   She was advised to call immediately if she has any concerning symptoms in the interval. The patient voices understanding of current disease status and treatment options and is in agreement with the current care plan.  All questions were answered. The patient knows to call the clinic with any problems, questions or concerns. We can certainly see the patient much sooner if necessary.

## 2018-03-08 NOTE — Patient Instructions (Signed)
PFTs show very minimal changes of COPD Continue albuterol as needed We can make a referral to ENT as you continue to have hoarse voice We will give a flu vaccine today  Follow-up in 6 months.

## 2018-03-08 NOTE — Patient Instructions (Signed)
[facility] Discharge Instructions for Patients Receiving Chemotherapy  Today you received the following chemotherapy agents: Imfinzi.  To help prevent nausea and vomiting after your treatment, we encourage you to take your nausea medication as prescribed.   If you develop nausea and vomiting that is not controlled by your nausea medication, call the clinic.   BELOW ARE SYMPTOMS THAT SHOULD BE REPORTED IMMEDIATELY:  *FEVER GREATER THAN 100.5 F  *CHILLS WITH OR WITHOUT FEVER  NAUSEA AND VOMITING THAT IS NOT CONTROLLED WITH YOUR NAUSEA MEDICATION  *UNUSUAL SHORTNESS OF BREATH  *UNUSUAL BRUISING OR BLEEDING  TENDERNESS IN MOUTH AND THROAT WITH OR WITHOUT PRESENCE OF ULCERS  *URINARY PROBLEMS  *BOWEL PROBLEMS  UNUSUAL RASH Items with * indicate a potential emergency and should be followed up as soon as possible.  Feel free to call the clinic should you have any questions or concerns. The clinic phone number is [phone].  Please show the [facility] at check-in to the Emergency Department and triage nurse.

## 2018-03-08 NOTE — Progress Notes (Signed)
PFT completed today.  

## 2018-03-08 NOTE — Progress Notes (Signed)
Susan Holder    [id number]    [birth year]  Primary Care Physician:Patient, No Pcp Per  Referring Physician: No referring provider defined for this encounter.  Chief complaint: Follow-up for lung adenocarcinoma, stage IIIa (T3, N1/N2, M45)  HPI: 62 year old ex-smoker with new diagnosis of right hilar lung mass Evaluated at her primary care for recurrent bronchitis with chest x-ray that showed a right hilar opacity.  Subsequent CT scan and PET scan confirmed highly PET avid right hilar mass.  She has been referred to pulmonary for further evaluation  Underwent bronchoscope on 11/09/17 with findings showing adenocarcinoma.  Underwent chemoradiation 8/5 to 01/14/18 and is currently on immunotherapy with Durvalumab (started 10/15/129)  Pets: No pets Occupation: Works as a Aeronautical engineer for [employer] Exposures: No known exposures, no mold, hot tub Smoking history: 20- 40-pack-year smoker.  Quit in July 2019 Travel history: No significant travel history  Interim history: Breathing is doing well with no dyspnea. She has persistent hoarseness that started after the bronchoscope with no improvement with PPI therapy.  Outpatient Encounter Medications as of 03/08/2018  Medication Sig  . albuterol (PROVENTIL HFA) 108 (90 Base) MCG/ACT inhaler Inhale 1 puff into the lungs every 6 (six) hours as needed for wheezing or shortness of breath.   Marland Kitchen aspirin EC 81 MG tablet Take 81 mg by mouth daily.  . famotidine (PEPCID) 40 MG tablet Take 40 mg by mouth daily.  . hydroxychloroquine (PLAQUENIL) 200 MG tablet TAKE 1 TABLET BY MOUTH TWICE DAILY Monday THROUGH FRIDAY  . naproxen sodium (ALEVE) 220 MG tablet Take 220 mg by mouth daily as needed (PAIN).  . Probiotic Product (ALIGN PO) Take 1 capsule by mouth at bedtime.  . pseudoephedrine-guaifenesin (MUCINEX D) 60-600 MG 12 hr tablet Take 1 tablet by mouth as needed.   . Vitamin D, Ergocalciferol, (DRISDOL) 1.25 MG (50000 UT)  CAPS capsule Take 1 capsule (50,000 Units total) by mouth every 7 (seven) days.  . [DISCONTINUED] benzonatate (TESSALON) 100 MG capsule Take 1 capsule (100 mg total) by mouth 3 (three) times daily as needed for cough.   No facility-administered encounter medications on file as of 03/08/2018.    Physical Exam: Blood pressure 132/78, pulse 74, height 4\' 10"  (1.473 m), weight 111 lb (50.3 kg), last menstrual period 04/28/2000, SpO2 96 %. Gen:      No acute distress HEENT:  EOMI, sclera anicteric Neck:     No masses; no thyromegaly Lungs:    Clear to auscultation bilaterally; normal respiratory effort CV:         Regular rate and rhythm; no murmurs Abd:      + bowel sounds; soft, non-tender; no palpable masses, no distension Ext:    No edema; adequate peripheral perfusion Skin:      Warm and dry; no rash Neuro: alert and oriented x 3 Psych: normal mood and affect  Data Reviewed: Imaging CT chest 09/29/17- 4.6 x 3.9 cm mass at the right hilum as described above likely representing primary pulmonary malignancy. Associated adjacent subcarinal adenopathy. PET CT may be useful for further evaluation.  PET scan 10/06/17- right hilar mass, highly PET avid.  Extends into the subcarinal region.  No other mediastinal, supraclavicular lymph nodes.  I reviewed the images personally.  PFTs 03/07/2018 FVC 1.91 [9 9%), FEV1 1.42 [95%), F/F 74, TLC 65%  Pathology 11/09/17 Non-small cell lung cancer consistent with adenocarcinoma, staining pattern is suggestive of neuroendocrine differentiation  Assessment:  Lung  cancer s/p chemoradiation Currently on treatment for stage IIIa adenocarcinoma with immunotherapy  Smoker Quit smoking earlier this year PFTs do not show any obstruction.  Observe off regular inhalers.  Hoarseness of voice Started after the bronchoscope and may be vocal cord irritation from the ET tube.  May have significant GERD contributing to symptoms Continue Protonix, Pepcid Since she  has persistent symptoms we will refer her to ENT.  Health maintenance Flu vaccination today  Plan/Recommendations: - Referral to ENT - Continue Protonix, Pepcid - Flu vaccine  Marshell Garfinkel MD [facility] 03/08/2018, 12:14 PM

## 2018-03-16 DIAGNOSIS — R490 Dysphonia: Secondary | ICD-10-CM | POA: Insufficient documentation

## 2018-03-16 DIAGNOSIS — C3490 Malignant neoplasm of unspecified part of unspecified bronchus or lung: Secondary | ICD-10-CM | POA: Insufficient documentation

## 2018-03-16 HISTORY — DX: Dysphonia: R49.0

## 2018-03-22 ENCOUNTER — Inpatient Hospital Stay (HOSPITAL_BASED_OUTPATIENT_CLINIC_OR_DEPARTMENT_OTHER): Payer: BC Managed Care – PPO | Admitting: Internal Medicine

## 2018-03-22 ENCOUNTER — Inpatient Hospital Stay: Payer: BC Managed Care – PPO

## 2018-03-22 ENCOUNTER — Encounter: Payer: Self-pay | Admitting: Internal Medicine

## 2018-03-22 VITALS — HR 105

## 2018-03-22 VITALS — BP 118/78 | HR 107 | Temp 98.4°F | Resp 18 | Ht <= 58 in | Wt 112.7 lb

## 2018-03-22 DIAGNOSIS — R1310 Dysphagia, unspecified: Secondary | ICD-10-CM

## 2018-03-22 DIAGNOSIS — C3491 Malignant neoplasm of unspecified part of right bronchus or lung: Secondary | ICD-10-CM

## 2018-03-22 DIAGNOSIS — C3431 Malignant neoplasm of lower lobe, right bronchus or lung: Secondary | ICD-10-CM

## 2018-03-22 DIAGNOSIS — Z79899 Other long term (current) drug therapy: Secondary | ICD-10-CM | POA: Diagnosis not present

## 2018-03-22 DIAGNOSIS — Z5112 Encounter for antineoplastic immunotherapy: Secondary | ICD-10-CM | POA: Diagnosis not present

## 2018-03-22 LAB — CMP (CANCER CENTER ONLY)
ALT: 10 U/L (ref 0–44)
AST: 24 U/L (ref 15–41)
Albumin: 3.3 g/dL — ABNORMAL LOW (ref 3.5–5.0)
Alkaline Phosphatase: 99 U/L (ref 38–126)
Anion gap: 10 (ref 5–15)
BUN: 8 mg/dL (ref 8–23)
CO2: 24 mmol/L (ref 22–32)
Calcium: 10.2 mg/dL (ref 8.9–10.3)
Chloride: 104 mmol/L (ref 98–111)
Creatinine: 0.76 mg/dL (ref 0.44–1.00)
GFR, Est AFR Am: >60 mL/min (ref >60)
GFR, Estimated: >60 mL/min (ref >60)
Glucose, Bld: 83 mg/dL (ref 70–99)
Potassium: 3.8 mmol/L (ref 3.5–5.1)
Sodium: 138 mmol/L (ref 135–145)
Total Bilirubin: <0.2 mg/dL — ABNORMAL LOW (ref 0.3–1.2)
Total Protein: 8.1 g/dL (ref 6.5–8.1)

## 2018-03-22 LAB — CBC WITH DIFFERENTIAL (CANCER CENTER ONLY)
Abs Immature Granulocytes: 0.02 10*3/uL (ref 0.00–0.07)
Basophils Absolute: 0 10*3/uL (ref 0.0–0.1)
Basophils Relative: 0 %
Eosinophils Absolute: 0.1 10*3/uL (ref 0.0–0.5)
Eosinophils Relative: 2 %
HCT: 36.6 % (ref 36.0–46.0)
Hemoglobin: 11.3 g/dL — ABNORMAL LOW (ref 12.0–15.0)
Immature Granulocytes: 0 %
Lymphocytes Relative: 19 %
Lymphs Abs: 1.2 10*3/uL (ref 0.7–4.0)
MCH: 31.4 pg (ref 26.0–34.0)
MCHC: 30.9 g/dL (ref 30.0–36.0)
MCV: 101.7 fL — ABNORMAL HIGH (ref 80.0–100.0)
Monocytes Absolute: 0.7 10*3/uL (ref 0.1–1.0)
Monocytes Relative: 10 %
Neutro Abs: 4.4 10*3/uL (ref 1.7–7.7)
Neutrophils Relative %: 69 %
Platelet Count: 353 10*3/uL (ref 150–400)
RBC: 3.6 MIL/uL — ABNORMAL LOW (ref 3.87–5.11)
RDW: 15 % (ref 11.5–15.5)
WBC Count: 6.4 10*3/uL (ref 4.0–10.5)
nRBC: 0 % (ref 0.0–0.2)

## 2018-03-22 MED ORDER — SODIUM CHLORIDE 0.9 % IV SOLN
Freq: Once | INTRAVENOUS | Status: AC
  Administered 2018-03-22: 12:00:00 via INTRAVENOUS
  Filled 2018-03-22: qty 250

## 2018-03-22 MED ORDER — SODIUM CHLORIDE 0.9 % IV SOLN
500.0000 mg | Freq: Once | INTRAVENOUS | Status: AC
  Administered 2018-03-22: 500 mg via INTRAVENOUS
  Filled 2018-03-22: qty 10

## 2018-03-22 NOTE — Progress Notes (Signed)
Per Dr. Julien Nordmann okay to treat with HR 105

## 2018-03-22 NOTE — Progress Notes (Signed)
    [facility] [phone]   [phone]  OFFICE PROGRESS NOTE  Patient, No Pcp Per No address on file  DIAGNOSIS: Stage IIIA (T3, N1/N2, M0) non-small cell lung cancer, adenocarcinoma presented with large right lower lobe lung mass with extension to the right hilum and subcarinal area diagnosed in July 2019.  Biomarker Findings Tumor Mutational Burden - TMB-Intermediate (6 Muts/Mb) Microsatellite status - MS-Stable Genomic Findings For a complete list of the genes assayed, please refer to the Appendix. NRAS Q61R ARAF amplification STK11 G56W KRAS G13D MYCN amplification MCL1 amplification NKX2-1 amplification - equivocal? TP53 G245V 7 Disease relevant genes with no reportable alterations: EGFR, ALK, BRAF, MET, ERBB2, RET, ROS1   PRIOR THERAPY: Course of concurrent chemoradiation with weekly carboplatin for AUC of 2 and paclitaxel 45 mg/M2.  Status post 7 cycles.  Last dose was giving 01/11/2018.  CURRENT THERAPY: Consolidation treatment with immunotherapy with Imfinzi (Durvalumab) 10 mg/KG every 2 weeks.  First dose February 09, 2018.  Status post 3 cycles.  INTERVAL HISTORY: Susan Holder 62 y.o. female returns to the clinic today for follow-up visit.  The patient is feeling fine today with no concerning complaints except for dry cough and she is currently on Delsym.  She denied having any significant weight loss or night sweats.  She has no nausea, vomiting, diarrhea or constipation.  She denied having any headache or visual changes.  She continues to tolerate her treatment with Imfinzi (Durvalumab) fairly well.  She is here for evaluation before starting cycle #4.  MEDICAL HISTORY: Past Medical History:  Diagnosis Date  . Rheumatoid arthritis (HCC)     ALLERGIES:  has No Known Allergies.  MEDICATIONS:  Current Outpatient Medications  Medication Sig Dispense Refill  . albuterol (PROVENTIL HFA) 108 (90 Base) MCG/ACT inhaler Inhale 1  puff into the lungs every 6 (six) hours as needed for wheezing or shortness of breath.     . aspirin EC 81 MG tablet Take 81 mg by mouth daily.    . famotidine (PEPCID) 40 MG tablet Take 40 mg by mouth daily.    . hydroxychloroquine (PLAQUENIL) 200 MG tablet TAKE 1 TABLET BY MOUTH TWICE DAILY Monday THROUGH FRIDAY 40 tablet 2  . naproxen sodium (ALEVE) 220 MG tablet Take 220 mg by mouth daily as needed (PAIN).    . Probiotic Product (ALIGN PO) Take 1 capsule by mouth at bedtime.    . pseudoephedrine-guaifenesin (MUCINEX D) 60-600 MG 12 hr tablet Take 1 tablet by mouth as needed.     . Vitamin D, Ergocalciferol, (DRISDOL) 1.25 MG (50000 UT) CAPS capsule Take 1 capsule (50,000 Units total) by mouth every 7 (seven) days. 12 capsule 0   No current facility-administered medications for this visit.     SURGICAL HISTORY:  Past Surgical History:  Procedure Laterality Date  . BACK SURGERY    . BRONCHIAL NEEDLE ASPIRATION BIOPSY  11/09/2017   Procedure: BRONCHIAL NEEDLE ASPIRATION BIOPSIES;  Surgeon: Mannam, Praveen, MD;  Location: [facility];  Service: Cardiopulmonary;;  . ENDOBRONCHIAL ULTRASOUND Bilateral 11/09/2017   Procedure: ENDOBRONCHIAL ULTRASOUND;  Surgeon: Mannam, Praveen, MD;  Location: [facility];  Service: Cardiopulmonary;  Laterality: Bilateral;  . NASAL SINUS SURGERY    . NASAL SINUS SURGERY    . TUBAL LIGATION    . VIDEO BRONCHOSCOPY  11/09/2017   Procedure: VIDEO BRONCHOSCOPY;  Surgeon: Mannam, Praveen, MD;  Location: [facility];  Service: Cardiopulmonary;;    REVIEW OF SYSTEMS:  A comprehensive review   of systems was negative except for: Constitutional: positive for fatigue Respiratory: positive for cough   PHYSICAL EXAMINATION: General appearance: alert, cooperative, fatigued and no distress Head: Normocephalic, without obvious abnormality, atraumatic Neck: no adenopathy, no JVD, supple, symmetrical, trachea midline and thyroid not enlarged, symmetric, no  tenderness/mass/nodules Lymph nodes: Cervical, supraclavicular, and axillary nodes normal. Resp: clear to auscultation bilaterally Back: symmetric, no curvature. ROM normal. No CVA tenderness. Cardio: regular rate and rhythm, S1, S2 normal, no murmur, click, rub or gallop GI: soft, non-tender; bowel sounds normal; no masses,  no organomegaly Extremities: extremities normal, atraumatic, no cyanosis or edema  ECOG PERFORMANCE STATUS: 1 - Symptomatic but completely ambulatory  Blood pressure 118/78, pulse (!) 107, temperature 98.4 F (36.9 C), temperature source Oral, resp. rate 18, height 4' 10" (1.473 m), weight 112 lb 11.2 oz (51.1 kg), last menstrual period 04/28/2000, SpO2 98 %.  LABORATORY DATA: Lab Results  Component Value Date   WBC 6.4 03/22/2018   HGB 11.3 (L) 03/22/2018   HCT 36.6 03/22/2018   MCV 101.7 (H) 03/22/2018   PLT 353 03/22/2018      Chemistry      Component Value Date/Time   NA 137 03/08/2018 1303   K 3.6 03/08/2018 1303   CL 101 03/08/2018 1303   CO2 25 03/08/2018 1303   BUN 7 (L) 03/08/2018 1303   CREATININE 0.79 03/08/2018 1303   CREATININE 0.80 07/24/2017 1438      Component Value Date/Time   CALCIUM 9.7 03/08/2018 1303   ALKPHOS 77 03/08/2018 1303   AST 24 03/08/2018 1303   ALT 12 03/08/2018 1303   BILITOT 0.2 (L) 03/08/2018 1303       RADIOGRAPHIC STUDIES: No results found.  ASSESSMENT AND PLAN: This is a very pleasant 62 years old African-American female recently diagnosed with a stage IIIA non-small cell lung cancer, adenocarcinoma.  She underwent a course of concurrent chemoradiation with weekly carboplatin and paclitaxel status post 7 cycles with partial response.   The patient tolerated this course of treatment well except for mild odynophagia and dysphagia. She is currently on consolidation treatment with immunotherapy with Imfinzi (Durvalumab) status post 3 cycles. She continues to tolerate this treatment well with no concerning  adverse effects. I recommended for the patient to proceed with cycle #4 today as scheduled. I will see her back for follow-up visit in 2 weeks for evaluation before starting cycle #5. The patient was advised to call immediately if she has any concerning symptoms in the interval. The patient voices understanding of current disease status and treatment options and is in agreement with the current care plan.  All questions were answered. The patient knows to call the clinic with any problems, questions or concerns. We can certainly see the patient much sooner if necessary.  Disclaimer: This note was dictated with voice recognition software. Similar sounding words can inadvertently be transcribed and may not be corrected upon review.       

## 2018-03-22 NOTE — Patient Instructions (Signed)
[facility] Discharge Instructions for Patients Receiving Chemotherapy  Today you received the following chemotherapy agents: Imfinzi.  To help prevent nausea and vomiting after your treatment, we encourage you to take your nausea medication as directed.   If you develop nausea and vomiting that is not controlled by your nausea medication, call the clinic.   BELOW ARE SYMPTOMS THAT SHOULD BE REPORTED IMMEDIATELY:  *FEVER GREATER THAN 100.5 F  *CHILLS WITH OR WITHOUT FEVER  NAUSEA AND VOMITING THAT IS NOT CONTROLLED WITH YOUR NAUSEA MEDICATION  *UNUSUAL SHORTNESS OF BREATH  *UNUSUAL BRUISING OR BLEEDING  TENDERNESS IN MOUTH AND THROAT WITH OR WITHOUT PRESENCE OF ULCERS  *URINARY PROBLEMS  *BOWEL PROBLEMS  UNUSUAL RASH Items with * indicate a potential emergency and should be followed up as soon as possible.  Feel free to call the clinic should you have any questions or concerns. The clinic phone number is [phone].  Please show the CHEMO ALERT CARD at check-in to the Emergency Department and triage nurse.   

## 2018-03-23 ENCOUNTER — Telehealth: Payer: Self-pay | Admitting: Internal Medicine

## 2018-03-23 NOTE — Telephone Encounter (Signed)
Scheduled appt per 11/25 los - pt to get an updated schedule next visit.

## 2018-03-30 ENCOUNTER — Telehealth: Payer: Self-pay | Admitting: Internal Medicine

## 2018-03-30 NOTE — Telephone Encounter (Signed)
MM PAL - moved 12/10 appointments fot 12/9. Spoke with patient.

## 2018-04-02 ENCOUNTER — Telehealth: Payer: Self-pay | Admitting: Rheumatology

## 2018-04-02 MED ORDER — PREDNISONE 5 MG PO TABS: ORAL_TABLET | ORAL | 0 refills | Status: DC

## 2018-04-02 NOTE — Telephone Encounter (Signed)
Patient called stating that she is having a flare in her right shoulder around the shoulder blade and wanted to know if Dr. Estanislado Pandy would call her in a RX for Prednisone.  CB#[phone].  Thank you

## 2018-04-02 NOTE — Progress Notes (Signed)
Current regimen includes Plaquenil 200 mg twice daily Monday through Friday.  Last Plaquenil eye exam normal on 04/29/2017.  Most recent CBC/CMP stable on 03/22/2018.  Next CBC/CMP due in April and then every 5 months.  Patient received flu shot in November.  Recommend pneumonia and Shingrix vaccines as indicated.  X-ray of bilateral hands taken on 11/14/2016.

## 2018-04-02 NOTE — Telephone Encounter (Signed)
Patient states she is having pain in right her shoulder. patient denies any swelling. Patient states she has not had a flare in years. Patient states she " I may have over did it over the holidays cooking." Patient is on PLQ twice daily Monday-Friday and taking as prescribed. Patient was last seen 02/18/18. Patient has scheduled an appointment to be seen on 12/10/190 for the flare but is requesting a prescription for Prednisone to help over the weekend. Please advise.

## 2018-04-02 NOTE — Telephone Encounter (Signed)
Okay to give prednisone starting at 20 mg p.o. daily and taper by 5 mg every 2 days.

## 2018-04-02 NOTE — Telephone Encounter (Signed)
Patient advised prescription sent to the pharmacy.

## 2018-04-05 ENCOUNTER — Inpatient Hospital Stay: Payer: BC Managed Care – PPO

## 2018-04-05 ENCOUNTER — Encounter: Payer: Self-pay | Admitting: Oncology

## 2018-04-05 ENCOUNTER — Inpatient Hospital Stay: Payer: BC Managed Care – PPO | Attending: Internal Medicine

## 2018-04-05 ENCOUNTER — Inpatient Hospital Stay (HOSPITAL_BASED_OUTPATIENT_CLINIC_OR_DEPARTMENT_OTHER): Payer: BC Managed Care – PPO | Admitting: Oncology

## 2018-04-05 VITALS — BP 135/79 | HR 106 | Temp 98.3°F | Resp 18 | Ht <= 58 in | Wt 117.3 lb

## 2018-04-05 VITALS — HR 98

## 2018-04-05 DIAGNOSIS — M069 Rheumatoid arthritis, unspecified: Secondary | ICD-10-CM | POA: Diagnosis not present

## 2018-04-05 DIAGNOSIS — Z5112 Encounter for antineoplastic immunotherapy: Secondary | ICD-10-CM | POA: Insufficient documentation

## 2018-04-05 DIAGNOSIS — C3491 Malignant neoplasm of unspecified part of right bronchus or lung: Secondary | ICD-10-CM

## 2018-04-05 DIAGNOSIS — C3431 Malignant neoplasm of lower lobe, right bronchus or lung: Secondary | ICD-10-CM | POA: Insufficient documentation

## 2018-04-05 DIAGNOSIS — R1310 Dysphagia, unspecified: Secondary | ICD-10-CM

## 2018-04-05 LAB — CMP (CANCER CENTER ONLY)
ALT: 43 U/L (ref 0–44)
AST: 40 U/L (ref 15–41)
Albumin: 3.2 g/dL — ABNORMAL LOW (ref 3.5–5.0)
Alkaline Phosphatase: 133 U/L — ABNORMAL HIGH (ref 38–126)
Anion gap: 8 (ref 5–15)
BUN: 13 mg/dL (ref 8–23)
CO2: 23 mmol/L (ref 22–32)
Calcium: 9.2 mg/dL (ref 8.9–10.3)
Chloride: 110 mmol/L (ref 98–111)
Creatinine: 0.81 mg/dL (ref 0.44–1.00)
GFR, Est AFR Am: >60 mL/min (ref >60)
GFR, Estimated: >60 mL/min (ref >60)
Glucose, Bld: 131 mg/dL — ABNORMAL HIGH (ref 70–99)
Potassium: 4.1 mmol/L (ref 3.5–5.1)
Sodium: 141 mmol/L (ref 135–145)
Total Bilirubin: <0.2 mg/dL — ABNORMAL LOW (ref 0.3–1.2)
Total Protein: 7.2 g/dL (ref 6.5–8.1)

## 2018-04-05 LAB — CBC WITH DIFFERENTIAL (CANCER CENTER ONLY)
Abs Immature Granulocytes: 0.02 10*3/uL (ref 0.00–0.07)
Basophils Absolute: 0 10*3/uL (ref 0.0–0.1)
Basophils Relative: 0 %
Eosinophils Absolute: 0 10*3/uL (ref 0.0–0.5)
Eosinophils Relative: 0 %
HCT: 32.1 % — ABNORMAL LOW (ref 36.0–46.0)
Hemoglobin: 10.4 g/dL — ABNORMAL LOW (ref 12.0–15.0)
Immature Granulocytes: 0 %
Lymphocytes Relative: 11 %
Lymphs Abs: 0.9 10*3/uL (ref 0.7–4.0)
MCH: 32.5 pg (ref 26.0–34.0)
MCHC: 32.4 g/dL (ref 30.0–36.0)
MCV: 100.3 fL — ABNORMAL HIGH (ref 80.0–100.0)
Monocytes Absolute: 0.4 10*3/uL (ref 0.1–1.0)
Monocytes Relative: 4 %
Neutro Abs: 7.1 10*3/uL (ref 1.7–7.7)
Neutrophils Relative %: 85 %
Platelet Count: 330 10*3/uL (ref 150–400)
RBC: 3.2 MIL/uL — ABNORMAL LOW (ref 3.87–5.11)
RDW: 14.6 % (ref 11.5–15.5)
WBC Count: 8.4 10*3/uL (ref 4.0–10.5)
nRBC: 0 % (ref 0.0–0.2)

## 2018-04-05 LAB — TSH: TSH: 0.37 u[IU]/mL (ref 0.308–3.960)

## 2018-04-05 MED ORDER — SODIUM CHLORIDE 0.9 % IV SOLN
Freq: Once | INTRAVENOUS | Status: AC
  Administered 2018-04-05: 14:00:00 via INTRAVENOUS
  Filled 2018-04-05: qty 250

## 2018-04-05 MED ORDER — SODIUM CHLORIDE 0.9 % IV SOLN
500.0000 mg | Freq: Once | INTRAVENOUS | Status: AC
  Administered 2018-04-05: 500 mg via INTRAVENOUS
  Filled 2018-04-05: qty 10

## 2018-04-05 NOTE — Patient Instructions (Signed)
[facility] Discharge Instructions for Patients Receiving Chemotherapy  Today you received the following chemotherapy agents: Imfinzi.  To help prevent nausea and vomiting after your treatment, we encourage you to take your nausea medication as directed.   If you develop nausea and vomiting that is not controlled by your nausea medication, call the clinic.   BELOW ARE SYMPTOMS THAT SHOULD BE REPORTED IMMEDIATELY:  *FEVER GREATER THAN 100.5 F  *CHILLS WITH OR WITHOUT FEVER  NAUSEA AND VOMITING THAT IS NOT CONTROLLED WITH YOUR NAUSEA MEDICATION  *UNUSUAL SHORTNESS OF BREATH  *UNUSUAL BRUISING OR BLEEDING  TENDERNESS IN MOUTH AND THROAT WITH OR WITHOUT PRESENCE OF ULCERS  *URINARY PROBLEMS  *BOWEL PROBLEMS  UNUSUAL RASH Items with * indicate a potential emergency and should be followed up as soon as possible.  Feel free to call the clinic should you have any questions or concerns. The clinic phone number is [phone].  Please show the CHEMO ALERT CARD at check-in to the Emergency Department and triage nurse.   

## 2018-04-05 NOTE — Progress Notes (Signed)
[facility] OFFICE PROGRESS NOTE  Patient, No Pcp Per No address on file  DIAGNOSIS: Stage IIIA (T3,N1/N2, M0)non-small cell lung cancer, adenocarcinoma presented with large right lower lobe lung mass with extension to the right hilum and subcarinal area diagnosed in July 2019.  Biomarker Findings Tumor Mutational Burden - TMB-Intermediate (6 Muts/Mb) Microsatellite status - MS-Stable Genomic Findings For a complete list of the genes assayed, please refer to the Appendix. NRAS Q61R ARAF amplification STK11 G56W KRAS G13D MYCN amplification MCL1 amplification NKX2-1 amplification - equivocal? TP53 G245V 7 Disease relevant genes with no reportable alterations: EGFR, ALK, BRAF, MET, ERBB2, RET, ROS1   PRIOR THERAPY: Course of concurrent chemoradiation with weekly carboplatin for AUC of 2 and paclitaxel 45 mg/M2.  Status post 7 cycles.  Last dose was giving 01/11/2018.  CURRENT THERAPY: Consolidation treatment with immunotherapy with Imfinzi (Durvalumab) 10 mg/KG every 2 weeks.  First dose February 09, 2018.  Status post 4 cycles.  INTERVAL HISTORY: Susan Holder 62 y.o. female returns for a routine follow-up visit by herself.  The patient is feeling fine today and has no specific complaints of her right shoulder pain.  Her right shoulder has been hurting her for approximately 1 week.  She contacted her rheumatologist who gave her a prednisone taper.  She took 15 mg today and is due to start 10 mg tomorrow for 2 days followed by 5 mg a day for 2 days and then stop.  Her pain has not really improved with the prednisone.  She remains on Plaquenil.  No other joints are hurting at this time.  She is scheduled to follow-up with rheumatology tomorrow.  Denies fevers and chills.  Denies chest pain, shortness of breath, cough, hemoptysis.  Denies nausea, vomiting, constipation, diarrhea.  Denies recent weight loss or night sweats.  The patient is here for evaluation prior to  cycle #5 of Imfinzi.  MEDICAL HISTORY: Past Medical History:  Diagnosis Date  . Rheumatoid arthritis ([place])     ALLERGIES:  has No Known Allergies.  MEDICATIONS:  Current Outpatient Medications  Medication Sig Dispense Refill  . aspirin EC 81 MG tablet Take 81 mg by mouth daily.    . famotidine (PEPCID) 40 MG tablet Take 40 mg by mouth daily.    . hydroxychloroquine (PLAQUENIL) 200 MG tablet TAKE 1 TABLET BY MOUTH TWICE DAILY Monday THROUGH FRIDAY 40 tablet 2  . naproxen sodium (ALEVE) 220 MG tablet Take 220 mg by mouth daily as needed (PAIN).    Marland Kitchen Phenylephrine-DM-GG-APAP (DELSYM COUGH/COLD DAYTIME PO) Take by mouth.    . predniSONE (DELTASONE) 5 MG tablet Take 4 tabs po x 2 days, 3  tabs po x 2 days, 2  tabs po x 2 days, 1  tab po x 2 days 20 tablet 0  . Probiotic Product (ALIGN PO) Take 1 capsule by mouth at bedtime.    . pseudoephedrine-guaifenesin (MUCINEX D) 60-600 MG 12 hr tablet Take 1 tablet by mouth as needed.     . Vitamin D, Ergocalciferol, (DRISDOL) 1.25 MG (50000 UT) CAPS capsule Take 1 capsule (50,000 Units total) by mouth every 7 (seven) days. 12 capsule 0  . albuterol (PROVENTIL HFA) 108 (90 Base) MCG/ACT inhaler Inhale 1 puff into the lungs every 6 (six) hours as needed for wheezing or shortness of breath.      No current facility-administered medications for this visit.    Facility-Administered Medications Ordered in Other Visits  Medication Dose Route Frequency Provider Last Rate Last  Dose  . durvalumab (IMFINZI) 500 mg in sodium chloride 0.9 % 100 mL chemo infusion  500 mg Intravenous Once Curt Bears, MD 110 mL/hr at 04/05/18 1504 500 mg at 04/05/18 1504    SURGICAL HISTORY:  Past Surgical History:  Procedure Laterality Date  . BACK SURGERY    . BRONCHIAL NEEDLE ASPIRATION BIOPSY  11/09/2017   Procedure: BRONCHIAL NEEDLE ASPIRATION BIOPSIES;  Surgeon: Marshell Garfinkel, MD;  Location: [facility];  Service: Cardiopulmonary;;  . ENDOBRONCHIAL ULTRASOUND  Bilateral 11/09/2017   Procedure: ENDOBRONCHIAL ULTRASOUND;  Surgeon: Marshell Garfinkel, MD;  Location: [facility];  Service: Cardiopulmonary;  Laterality: Bilateral;  . NASAL SINUS SURGERY    . NASAL SINUS SURGERY    . TUBAL LIGATION    . VIDEO BRONCHOSCOPY  11/09/2017   Procedure: VIDEO BRONCHOSCOPY;  Surgeon: Marshell Garfinkel, MD;  Location: [facility];  Service: Cardiopulmonary;;    REVIEW OF SYSTEMS:   Review of Systems  Constitutional: Negative for appetite change, chills, fatigue, fever and unexpected weight change.  HENT:   Negative for mouth sores, nosebleeds, sore throat and trouble swallowing.   Eyes: Negative for eye problems and icterus.  Respiratory: Negative for cough, hemoptysis, shortness of breath and wheezing.   Cardiovascular: Negative for chest pain and leg swelling.  Gastrointestinal: Negative for abdominal pain, constipation, diarrhea, nausea and vomiting.  Genitourinary: Negative for bladder incontinence, difficulty urinating, dysuria, frequency and hematuria.   Musculoskeletal: Negative for back pain, gait problem, neck pain and neck stiffness.  Positive for right shoulder pain. Skin: Negative for itching and rash.  Neurological: Negative for dizziness, extremity weakness, gait problem, headaches, light-headedness and seizures.  Hematological: Negative for adenopathy. Does not bruise/bleed easily.  Psychiatric/Behavioral: Negative for confusion, depression and sleep disturbance. The patient is not nervous/anxious.     PHYSICAL EXAMINATION:  Blood pressure 135/79, pulse (!) 106, temperature 98.3 F (36.8 C), temperature source Oral, resp. rate 18, height 4' 10"  (1.473 m), weight 117 lb 4.8 oz (53.2 kg), last menstrual period 04/28/2000, SpO2 100 %.  ECOG PERFORMANCE STATUS: 1 - Symptomatic but completely ambulatory  Physical Exam  Constitutional: Oriented to person, place, and time and well-developed, well-nourished, and in no distress. No distress.  HENT:   Head: Normocephalic and atraumatic.  Mouth/Throat: Oropharynx is clear and moist. No oropharyngeal exudate.  Eyes: Conjunctivae are normal. Right eye exhibits no discharge. Left eye exhibits no discharge. No scleral icterus.  Neck: Normal range of motion. Neck supple.  Cardiovascular: Normal rate, regular rhythm, normal heart sounds and intact distal pulses.   Pulmonary/Chest: Effort normal and breath sounds normal. No respiratory distress. No wheezes. No rales.  Abdominal: Soft. Bowel sounds are normal. Exhibits no distension and no mass. There is no tenderness.  Musculoskeletal: Normal range of motion. Exhibits no edema.  Right shoulder tender to the touch.  No redness or swelling noted. Lymphadenopathy:    No cervical adenopathy.  Neurological: Alert and oriented to person, place, and time. Exhibits normal muscle tone. Gait normal. Coordination normal.  Skin: Skin is warm and dry. No rash noted. Not diaphoretic. No erythema. No pallor.  Psychiatric: Mood, memory and judgment normal.  Vitals reviewed.  LABORATORY DATA: Lab Results  Component Value Date   WBC 8.4 04/05/2018   HGB 10.4 (L) 04/05/2018   HCT 32.1 (L) 04/05/2018   MCV 100.3 (H) 04/05/2018   PLT 330 04/05/2018      Chemistry      Component Value Date/Time   NA 141 04/05/2018 1305   K 4.1  04/05/2018 1305   CL 110 04/05/2018 1305   CO2 23 04/05/2018 1305   BUN 13 04/05/2018 1305   CREATININE 0.81 04/05/2018 1305   CREATININE 0.80 07/24/2017 1438      Component Value Date/Time   CALCIUM 9.2 04/05/2018 1305   ALKPHOS 133 (H) 04/05/2018 1305   AST 40 04/05/2018 1305   ALT 43 04/05/2018 1305   BILITOT <0.2 (L) 04/05/2018 1305       RADIOGRAPHIC STUDIES:  No results found.   ASSESSMENT/PLAN:  Adenocarcinoma of right lung, stage 3 (HCC) This is a very pleasant 62 year old African-American female recently diagnosed with a stage IIIA non-small cell lung cancer, adenocarcinoma.  She underwent a course of  concurrent chemoradiation with weekly carboplatin and paclitaxel status post 7 cycles with partial response.   The patient tolerated this course of treatment well except for mild odynophagia and dysphagia. She is currently on consolidation treatment with immunotherapy with Imfinzi (Durvalumab) status post 4 cycles. She continues to tolerate this treatment well with no concerning adverse effects.  She has developed right shoulder pain and is not clear if her pain is related to overuse versus a flare of her rheumatoid arthritis.  NCCN guidelines were reviewed regarding the management of immune checkpoint inhibitor related toxicities.  Since only one joint is involved, this would be considered a mild side effect and will continue immunotherapy.  Guidelines recommend use of NSAIDs and possible intra-articular steroids if needed.  She will follow-up with rheumatology tomorrow as scheduled.  I have discussed with the patient that she should try to avoid the use of steroids unless absolutely needed while on immunotherapy. The patient will follow-up in 2 weeks for evaluation prior to cycle #6 of Imfinzi.  The patient was advised to call immediately if she has any concerning symptoms in the interval. The patient voices understanding of current disease status and treatment options and is in agreement with the current care plan.  All questions were answered. The patient knows to call the clinic with any problems, questions or concerns. We can certainly see the patient much sooner if necessary.   No orders of the defined types were placed in this encounter.    Mikey Bussing, DNP, AGPCNP-BC, AOCNP 04/05/18

## 2018-04-05 NOTE — Progress Notes (Signed)
Office Visit Note  Patient: Susan Holder             Date of Birth: 23-May-1955           MRN: [id number]             PCP: Patient, No Pcp Per Referring: No ref. provider found Visit Date: 04/06/2018 Occupation: @GUAROCC @  Subjective:  Right shoulder pain   History of Present Illness: Susan Holder is a 62 y.o. female with history of seropositive rheumatoid arthritis and DDD.  Patient is on PLQ 200 mg BID M-F.  According to patient she is getting chemotherapy for lung cancer.  She states about a week ago she started having right shoulder pain.  She states during the Thanksgiving she did a lot of cooking and stirring and chopping.  Pain from the shoulder has been radiating to the thoracic region in the right arm.  None of the other joints are painful or swollen.  Denies any discomfort in her cervical spine.  Activities of Daily Living:  Patient reports morning stiffness for 0 minute.   Patient Reports nocturnal pain. SJ Difficulty dressing/grooming: Denies Difficulty climbing stairs: Denies Difficulty getting out of chair: Denies Difficulty using hands for taps, buttons, cutlery, and/or writing: Denies  Review of Systems  Constitutional: Positive for fatigue. Negative for night sweats, weight gain and weight loss.  HENT: Negative for mouth sores, trouble swallowing, trouble swallowing, mouth dryness and nose dryness.   Eyes: Negative for pain, redness, visual disturbance and dryness.  Respiratory: Negative for cough, hemoptysis, shortness of breath and difficulty breathing.   Cardiovascular: Negative for chest pain, palpitations, hypertension, irregular heartbeat and swelling in legs/feet.  Gastrointestinal: Negative for blood in stool, constipation and diarrhea.  Endocrine: Negative for increased urination.  Genitourinary: Negative for painful urination and vaginal dryness.  Musculoskeletal: Positive for arthralgias and joint pain. Negative for joint swelling, myalgias,  muscle weakness, morning stiffness, muscle tenderness and myalgias.  Skin: Negative for color change, pallor, rash, hair loss, nodules/bumps, skin tightness, ulcers and sensitivity to sunlight.  Allergic/Immunologic: Negative for susceptible to infections.  Neurological: Negative for dizziness, numbness, headaches, memory loss, night sweats and weakness.  Hematological: Negative for swollen glands.  Psychiatric/Behavioral: Positive for sleep disturbance. Negative for depressed mood. The patient is not nervous/anxious.     PMFS History:  Patient Active Problem List   Diagnosis Date Noted  . Encounter for antineoplastic immunotherapy 02/01/2018  . Primary malignant neoplasm of bronchus of right lower lobe ([place]) 12/21/2017  . Adenocarcinoma of right lung, stage 3 ([place]) 11/17/2017  . Encounter for antineoplastic chemotherapy 11/17/2017  . Goals of care, counseling/discussion 11/17/2017  . Lung mass   . Left hand pain 03/06/2017  . Rheumatoid arthritis involving multiple sites with positive rheumatoid factor ([place]) 05/29/2016  . ANA positive 05/29/2016  . Vitamin D deficiency 05/29/2016  . High risk medication use 05/29/2016  . Trigger finger, left ring finger 05/29/2016  . Trigger finger, right ring finger 05/29/2016  . DDD (degenerative disc disease), cervical 05/29/2016  . Smoker 05/29/2016  . Other fatigue 05/29/2016  . Allergic rhinitis 08/22/2013    Past Medical History:  Diagnosis Date  . Rheumatoid arthritis ([place])     Family History  Problem Relation Age of Onset  . Stroke Mother   . Alzheimer's disease Mother   . Heart disease Mother   . Emphysema Father   . Hypertension Brother   . Heart attack Maternal Aunt   . Heart failure Maternal  Grandmother   . Hypertension Paternal Grandmother    Past Surgical History:  Procedure Laterality Date  . BACK SURGERY    . BRONCHIAL NEEDLE ASPIRATION BIOPSY  11/09/2017   Procedure: BRONCHIAL NEEDLE ASPIRATION BIOPSIES;  Surgeon:  Marshell Garfinkel, MD;  Location: [facility];  Service: Cardiopulmonary;;  . ENDOBRONCHIAL ULTRASOUND Bilateral 11/09/2017   Procedure: ENDOBRONCHIAL ULTRASOUND;  Surgeon: Marshell Garfinkel, MD;  Location: [facility];  Service: Cardiopulmonary;  Laterality: Bilateral;  . NASAL SINUS SURGERY    . NASAL SINUS SURGERY    . TUBAL LIGATION    . VIDEO BRONCHOSCOPY  11/09/2017   Procedure: VIDEO BRONCHOSCOPY;  Surgeon: Marshell Garfinkel, MD;  Location: [facility];  Service: Cardiopulmonary;;   Social History   Social History Narrative  . Not on file    Objective: Vital Signs: BP 122/75 (BP Location: Left Arm, Patient Position: Sitting, Cuff Size: Normal)   Pulse 94   Resp 12   Ht 5' (1.524 m)   Wt 117 lb 9.6 oz (53.3 kg)   LMP 04/28/2000   BMI 22.97 kg/m    Physical Exam  Constitutional: She is oriented to person, place, and time. She appears well-developed and well-nourished.  HENT:  Head: Normocephalic and atraumatic.  Eyes: Conjunctivae and EOM are normal.  Neck: Normal range of motion.  Cardiovascular: Normal rate, regular rhythm, normal heart sounds and intact distal pulses.  Pulmonary/Chest: Effort normal and breath sounds normal.  Abdominal: Soft. Bowel sounds are normal.  Lymphadenopathy:    She has no cervical adenopathy.  Neurological: She is alert and oriented to person, place, and time.  Skin: Skin is warm and dry. Capillary refill takes less than 2 seconds.  Psychiatric: She has a normal mood and affect. Her behavior is normal.  Nursing note and vitals reviewed.    Musculoskeletal Exam: Spine thoracic lumbar spine good range of motion.  She had painful range of motion of right shoulder joint especially with the internal rotation.  Left shoulder joint was in good range of motion.  Elbow joints wrist joint MCPs PIPs DIPs been good range of motion with no synovitis.  Hip joints knee joints ankles MTPs PIPs were in good range of motion with no synovitis.  CDAI Exam: CDAI  Score: 1.4  Patient Global Assessment: 2 (mm); Provider Global Assessment: 2 (mm) Swollen: 0 ; Tender: 1  Joint Exam      Right  Left  Glenohumeral   Tender        Investigation: No additional findings.  Imaging: No results found.  Recent Labs: Lab Results  Component Value Date   WBC 8.4 04/05/2018   HGB 10.4 (L) 04/05/2018   PLT 330 04/05/2018   NA 141 04/05/2018   K 4.1 04/05/2018   CL 110 04/05/2018   CO2 23 04/05/2018   GLUCOSE 131 (H) 04/05/2018   BUN 13 04/05/2018   CREATININE 0.81 04/05/2018   BILITOT <0.2 (L) 04/05/2018   ALKPHOS 133 (H) 04/05/2018   AST 40 04/05/2018   ALT 43 04/05/2018   PROT 7.2 04/05/2018   ALBUMIN 3.2 (L) 04/05/2018   CALCIUM 9.2 04/05/2018   GFRAA >60 04/05/2018    Speciality Comments: PLQ eye exam: 04/29/2017 Normal. [facility]. Follow up in 12 months.  Procedures:  Large Joint Inj: R glenohumeral on 04/06/2018 2:55 PM Indications: pain Details: 27 G 1.5 in needle, posterior approach  Arthrogram: No  Medications: 40 mg triamcinolone acetonide 40 MG/ML; 1.5 mL lidocaine 1 % Aspirate: 0 mL Outcome: tolerated well,  no immediate complications Procedure, treatment alternatives, risks and benefits explained, specific risks discussed. Consent was given by the patient. Immediately prior to procedure a time out was called to verify the correct patient, procedure, equipment, support staff and site/side marked as required. Patient was prepped and draped in the usual sterile fashion.     Allergies: Patient has no known allergies.   Assessment / Plan:     Visit Diagnoses: Rheumatoid arthritis involving multiple sites with positive rheumatoid factor (HCC) - +RF, +anti-CCP, +ANA with nodulosis.  Her rheumatoid arthritis is quite well controlled currently.  She has no active synovitis.  High risk medication use - PLQ 200 mg BID M-F. eye exam: 04/29/2017..  Labs have been stable.  Her alk phos is elevated but is stable.  Acute pain of  right shoulder -she has been having increased pain and discomfort in her right shoulder she is also experiencing nocturnal pain.  The pain is started after overusing her right arm during that Thanksgiving holidays.  Plan: XR Shoulder Right x-ray of the shoulder joint was unremarkable.  Some changes in the lung was noted due to underlying adenocarcinoma.  Treatment options were discussed and side effects were reviewed the right shoulder joint was injected with cortisone as described above.  DDD (degenerative disc disease), cervical -patient has no limitation of range of motion.  No radiculopathy is noted.  Vitamin D deficiency  Adenocarcinoma of right lung, stage 3 (HCC)-she is followed up by oncology.  Primary malignant neoplasm of bronchus of right lower lobe ([place])   Orders: Orders Placed This Encounter  Procedures  . XR Shoulder Right   No orders of the defined types were placed in this encounter.     Follow-Up Instructions: Return in about 5 months (around 09/05/2018) for Rheumatoid arthritis, DDD.   Bo Merino, MD  Note - This record has been created using Editor, commissioning.  Chart creation errors have been sought, but may not always  have been located. Such creation errors do not reflect on  the standard of medical care.

## 2018-04-05 NOTE — Assessment & Plan Note (Signed)
This is a very pleasant 62 year old African-American female recently diagnosed with a stage IIIA non-small cell lung cancer, adenocarcinoma.  She underwent a course of concurrent chemoradiation with weekly carboplatin and paclitaxel status post 7 cycles with partial response.   The patient tolerated this course of treatment well except for mild odynophagia and dysphagia. She is currently on consolidation treatment with immunotherapy with Imfinzi (Durvalumab) status post 4 cycles. She continues to tolerate this treatment well with no concerning adverse effects.  She has developed right shoulder pain and is not clear if her pain is related to overuse versus a flare of her rheumatoid arthritis.  NCCN guidelines were reviewed regarding the management of immune checkpoint inhibitor related toxicities.  Since only one joint is involved, this would be considered a mild side effect and will continue immunotherapy.  Guidelines recommend use of NSAIDs and possible intra-articular steroids if needed.  She will follow-up with rheumatology tomorrow as scheduled.  I have discussed with the patient that she should try to avoid the use of steroids unless absolutely needed while on immunotherapy. The patient will follow-up in 2 weeks for evaluation prior to cycle #6 of Imfinzi.  The patient was advised to call immediately if she has any concerning symptoms in the interval. The patient voices understanding of current disease status and treatment options and is in agreement with the current care plan.  All questions were answered. The patient knows to call the clinic with any problems, questions or concerns. We can certainly see the patient much sooner if necessary.

## 2018-04-05 NOTE — Progress Notes (Signed)
Per Mikey Bussing, Ok to treat while patient is on prednisone taper.

## 2018-04-06 ENCOUNTER — Ambulatory Visit: Payer: BC Managed Care – PPO | Admitting: Rheumatology

## 2018-04-06 ENCOUNTER — Ambulatory Visit (INDEPENDENT_AMBULATORY_CARE_PROVIDER_SITE_OTHER): Payer: Self-pay

## 2018-04-06 ENCOUNTER — Encounter: Payer: Self-pay | Admitting: Rheumatology

## 2018-04-06 ENCOUNTER — Telehealth: Payer: Self-pay | Admitting: Oncology

## 2018-04-06 ENCOUNTER — Ambulatory Visit: Payer: BC Managed Care – PPO

## 2018-04-06 ENCOUNTER — Other Ambulatory Visit: Payer: BC Managed Care – PPO

## 2018-04-06 ENCOUNTER — Ambulatory Visit: Payer: BC Managed Care – PPO | Admitting: Internal Medicine

## 2018-04-06 VITALS — BP 122/75 | HR 94 | Resp 12 | Ht 60.0 in | Wt 117.6 lb

## 2018-04-06 DIAGNOSIS — C3431 Malignant neoplasm of lower lobe, right bronchus or lung: Secondary | ICD-10-CM

## 2018-04-06 DIAGNOSIS — Z79899 Other long term (current) drug therapy: Secondary | ICD-10-CM | POA: Diagnosis not present

## 2018-04-06 DIAGNOSIS — M25511 Pain in right shoulder: Secondary | ICD-10-CM | POA: Diagnosis not present

## 2018-04-06 DIAGNOSIS — C3491 Malignant neoplasm of unspecified part of right bronchus or lung: Secondary | ICD-10-CM

## 2018-04-06 DIAGNOSIS — M5030 Other cervical disc degeneration, unspecified cervical region: Secondary | ICD-10-CM

## 2018-04-06 DIAGNOSIS — E559 Vitamin D deficiency, unspecified: Secondary | ICD-10-CM

## 2018-04-06 DIAGNOSIS — M0579 Rheumatoid arthritis with rheumatoid factor of multiple sites without organ or systems involvement: Secondary | ICD-10-CM | POA: Diagnosis not present

## 2018-04-06 MED ORDER — TRIAMCINOLONE ACETONIDE 40 MG/ML IJ SUSP
40.0000 mg | INTRAMUSCULAR | Status: AC | PRN
  Administered 2018-04-06: 40 mg via INTRA_ARTICULAR

## 2018-04-06 MED ORDER — LIDOCAINE HCL 1 % IJ SOLN
1.5000 mL | INTRAMUSCULAR | Status: AC | PRN
  Administered 2018-04-06: 1.5 mL

## 2018-04-06 NOTE — Telephone Encounter (Signed)
3 cycles already scheduled per 12/09 los.

## 2018-04-19 ENCOUNTER — Inpatient Hospital Stay: Payer: BC Managed Care – PPO

## 2018-04-19 ENCOUNTER — Telehealth: Payer: Self-pay | Admitting: Internal Medicine

## 2018-04-19 ENCOUNTER — Inpatient Hospital Stay: Payer: BC Managed Care – PPO | Admitting: Internal Medicine

## 2018-04-19 ENCOUNTER — Encounter: Payer: Self-pay | Admitting: Internal Medicine

## 2018-04-19 DIAGNOSIS — M069 Rheumatoid arthritis, unspecified: Secondary | ICD-10-CM

## 2018-04-19 DIAGNOSIS — C3491 Malignant neoplasm of unspecified part of right bronchus or lung: Secondary | ICD-10-CM

## 2018-04-19 DIAGNOSIS — C3490 Malignant neoplasm of unspecified part of unspecified bronchus or lung: Secondary | ICD-10-CM

## 2018-04-19 DIAGNOSIS — Z5112 Encounter for antineoplastic immunotherapy: Secondary | ICD-10-CM | POA: Diagnosis not present

## 2018-04-19 DIAGNOSIS — C3431 Malignant neoplasm of lower lobe, right bronchus or lung: Secondary | ICD-10-CM | POA: Diagnosis not present

## 2018-04-19 DIAGNOSIS — R1310 Dysphagia, unspecified: Secondary | ICD-10-CM

## 2018-04-19 LAB — CBC WITH DIFFERENTIAL (CANCER CENTER ONLY)
Abs Immature Granulocytes: 0.02 10*3/uL (ref 0.00–0.07)
Basophils Absolute: 0 10*3/uL (ref 0.0–0.1)
Basophils Relative: 0 %
Eosinophils Absolute: 0.1 10*3/uL (ref 0.0–0.5)
Eosinophils Relative: 1 %
HCT: 35.8 % — ABNORMAL LOW (ref 36.0–46.0)
Hemoglobin: 11.1 g/dL — ABNORMAL LOW (ref 12.0–15.0)
Immature Granulocytes: 0 %
Lymphocytes Relative: 10 %
Lymphs Abs: 0.9 10*3/uL (ref 0.7–4.0)
MCH: 32.7 pg (ref 26.0–34.0)
MCHC: 31 g/dL (ref 30.0–36.0)
MCV: 105.6 fL — ABNORMAL HIGH (ref 80.0–100.0)
Monocytes Absolute: 0.7 10*3/uL (ref 0.1–1.0)
Monocytes Relative: 8 %
Neutro Abs: 7.2 10*3/uL (ref 1.7–7.7)
Neutrophils Relative %: 81 %
Platelet Count: 155 10*3/uL (ref 150–400)
RBC: 3.39 MIL/uL — ABNORMAL LOW (ref 3.87–5.11)
RDW: 14.8 % (ref 11.5–15.5)
WBC Count: 8.9 10*3/uL (ref 4.0–10.5)
nRBC: 0 % (ref 0.0–0.2)

## 2018-04-19 LAB — CMP (CANCER CENTER ONLY)
ALT: 16 U/L (ref 0–44)
AST: 23 U/L (ref 15–41)
Albumin: 3.4 g/dL — ABNORMAL LOW (ref 3.5–5.0)
Alkaline Phosphatase: 98 U/L (ref 38–126)
Anion gap: 8 (ref 5–15)
BUN: 10 mg/dL (ref 8–23)
CO2: 24 mmol/L (ref 22–32)
Calcium: 9.3 mg/dL (ref 8.9–10.3)
Chloride: 107 mmol/L (ref 98–111)
Creatinine: 1 mg/dL (ref 0.44–1.00)
GFR, Est AFR Am: >60 mL/min (ref >60)
GFR, Estimated: 59 mL/min — ABNORMAL LOW (ref >60)
Glucose, Bld: 73 mg/dL (ref 70–99)
Potassium: 4.3 mmol/L (ref 3.5–5.1)
Sodium: 139 mmol/L (ref 135–145)
Total Bilirubin: 0.3 mg/dL (ref 0.3–1.2)
Total Protein: 7.2 g/dL (ref 6.5–8.1)

## 2018-04-19 MED ORDER — SODIUM CHLORIDE 0.9 % IV SOLN
Freq: Once | INTRAVENOUS | Status: AC
  Administered 2018-04-19: 14:00:00 via INTRAVENOUS
  Filled 2018-04-19: qty 250

## 2018-04-19 MED ORDER — SODIUM CHLORIDE 0.9 % IV SOLN
9.5000 mg/kg | Freq: Once | INTRAVENOUS | Status: AC
  Administered 2018-04-19: 500 mg via INTRAVENOUS
  Filled 2018-04-19: qty 10

## 2018-04-19 NOTE — Progress Notes (Signed)
[place] [phone]   Fax:(336) [phone]  OFFICE PROGRESS NOTE  Patient, No Pcp Per No address on file  DIAGNOSIS: Stage IIIA (T3, N1/N2, M0) non-small cell lung cancer, adenocarcinoma presented with large right lower lobe lung mass with extension to the right hilum and subcarinal area diagnosed in July 2019.  Biomarker Findings Tumor Mutational Burden - TMB-Intermediate (6 Muts/Mb) Microsatellite status - MS-Stable Genomic Findings For a complete list of the genes assayed, please refer to the Appendix. NRAS Q61R ARAF amplification STK11 G56W KRAS G13D MYCN amplification MCL1 amplification NKX2-1 amplification - equivocal? TP53 G245V 7 Disease relevant genes with no reportable alterations: EGFR, ALK, BRAF, MET, ERBB2, RET, ROS1   PRIOR THERAPY: Course of concurrent chemoradiation with weekly carboplatin for AUC of 2 and paclitaxel 45 mg/M2.  Status post 7 cycles.  Last dose was giving 01/11/2018.  CURRENT THERAPY: Consolidation treatment with immunotherapy with Imfinzi (Durvalumab) 10 mg/KG every 2 weeks.  First dose February 09, 2018.  Status post 5 cycles.  INTERVAL HISTORY: Susan Holder 62 y.o. female returns to the clinic today for follow-up visit.  The patient is feeling fine today with no concerning complaints except for recent nasal congestion.  She is using over-the-counter cold medication.  She denied having any current chest pain, shortness of breath, cough or hemoptysis.  She denied having any fever or chills.  She has no nausea, vomiting, diarrhea or constipation.  She continues to tolerate her treatment with Imfinzi fairly well.   MEDICAL HISTORY: Past Medical History:  Diagnosis Date  . Rheumatoid arthritis ([place])     ALLERGIES:  has No Known Allergies.  MEDICATIONS:  Current Outpatient Medications  Medication Sig Dispense Refill  . albuterol (PROVENTIL HFA) 108 (90 Base) MCG/ACT inhaler Inhale 1 puff into the lungs  every 6 (six) hours as needed for wheezing or shortness of breath.     Marland Kitchen aspirin EC 81 MG tablet Take 81 mg by mouth daily.    [name] (IMFINZI IV) Inject into the vein every 14 (fourteen) days.    . famotidine (PEPCID) 40 MG tablet Take 40 mg by mouth as needed.     . hydroxychloroquine (PLAQUENIL) 200 MG tablet TAKE 1 TABLET BY MOUTH TWICE DAILY Monday THROUGH FRIDAY 40 tablet 2  . naproxen sodium (ALEVE) 220 MG tablet Take 220 mg by mouth daily as needed (PAIN).    Marland Kitchen Phenylephrine-DM-GG-APAP (DELSYM COUGH/COLD DAYTIME PO) Take by mouth as needed.     . predniSONE (DELTASONE) 5 MG tablet Take 4 tabs po x 2 days, 3  tabs po x 2 days, 2  tabs po x 2 days, 1  tab po x 2 days 20 tablet 0  . Probiotic Product (ALIGN PO) Take 1 capsule by mouth at bedtime.    . pseudoephedrine-guaifenesin (MUCINEX D) 60-600 MG 12 hr tablet Take 1 tablet by mouth as needed.     . Vitamin D, Ergocalciferol, (DRISDOL) 1.25 MG (50000 UT) CAPS capsule Take 1 capsule (50,000 Units total) by mouth every 7 (seven) days. 12 capsule 0   No current facility-administered medications for this visit.     SURGICAL HISTORY:  Past Surgical History:  Procedure Laterality Date  . BACK SURGERY    . BRONCHIAL NEEDLE ASPIRATION BIOPSY  11/09/2017   Procedure: BRONCHIAL NEEDLE ASPIRATION BIOPSIES;  Surgeon: Marshell Garfinkel, MD;  Location: [facility];  Service: Cardiopulmonary;;  . ENDOBRONCHIAL ULTRASOUND Bilateral 11/09/2017   Procedure: ENDOBRONCHIAL ULTRASOUND;  Surgeon: Marshell Garfinkel,  MD;  Location: [facility];  Service: Cardiopulmonary;  Laterality: Bilateral;  . NASAL SINUS SURGERY    . NASAL SINUS SURGERY    . TUBAL LIGATION    . VIDEO BRONCHOSCOPY  11/09/2017   Procedure: VIDEO BRONCHOSCOPY;  Surgeon: Marshell Garfinkel, MD;  Location: [facility];  Service: Cardiopulmonary;;    REVIEW OF SYSTEMS:  A comprehensive review of systems was negative except for: Constitutional: positive for fatigue Ears, nose, mouth,  throat, and face: positive for nasal congestion   PHYSICAL EXAMINATION: General appearance: alert, cooperative, fatigued and no distress Head: Normocephalic, without obvious abnormality, atraumatic Neck: no adenopathy, no JVD, supple, symmetrical, trachea midline and thyroid not enlarged, symmetric, no tenderness/mass/nodules Lymph nodes: Cervical, supraclavicular, and axillary nodes normal. Resp: clear to auscultation bilaterally Back: symmetric, no curvature. ROM normal. No CVA tenderness. Cardio: regular rate and rhythm, S1, S2 normal, no murmur, click, rub or gallop GI: soft, non-tender; bowel sounds normal; no masses,  no organomegaly Extremities: extremities normal, atraumatic, no cyanosis or edema  ECOG PERFORMANCE STATUS: 1 - Symptomatic but completely ambulatory  Blood pressure (!) 144/75, pulse 91, temperature 98.2 F (36.8 C), temperature source Oral, resp. rate 18, height 5' (1.524 m), weight 119 lb 3.2 oz (54.1 kg), last menstrual period 04/28/2000, SpO2 100 %.  LABORATORY DATA: Lab Results  Component Value Date   WBC 8.9 04/19/2018   HGB 11.1 (L) 04/19/2018   HCT 35.8 (L) 04/19/2018   MCV 105.6 (H) 04/19/2018   PLT 155 04/19/2018      Chemistry      Component Value Date/Time   NA 141 04/05/2018 1305   K 4.1 04/05/2018 1305   CL 110 04/05/2018 1305   CO2 23 04/05/2018 1305   BUN 13 04/05/2018 1305   CREATININE 0.81 04/05/2018 1305   CREATININE 0.80 07/24/2017 1438      Component Value Date/Time   CALCIUM 9.2 04/05/2018 1305   ALKPHOS 133 (H) 04/05/2018 1305   AST 40 04/05/2018 1305   ALT 43 04/05/2018 1305   BILITOT <0.2 (L) 04/05/2018 1305       RADIOGRAPHIC STUDIES: Xr Shoulder Right  Result Date: 04/06/2018 No glenohumeral joint space narrowing was noted.  No acromioclavicular joint space narrowing was noted.  No chondrocalcinosis was noted.  She has some changes in her right lung due to underlying adenocarcinoma. Impression: Unremarkable x-ray of  the right shoulder joint.   ASSESSMENT AND PLAN: This is a very pleasant 62 years old African-American female recently diagnosed with a stage IIIA non-small cell lung cancer, adenocarcinoma.  She underwent a course of concurrent chemoradiation with weekly carboplatin and paclitaxel status post 7 cycles with partial response.   The patient tolerated this course of treatment well except for mild odynophagia and dysphagia. She is currently on consolidation treatment with immunotherapy with Imfinzi (Durvalumab) status post 5 cycles. She has been tolerating this treatment well with no concerning adverse effects. I recommended for the patient to proceed with cycle #6 today as scheduled. I will see her back for follow-up visit in 2 weeks for evaluation after repeating CT scan of the chest for restaging of her disease. The patient was advised to call immediately if she has any concerning symptoms in the interval. The patient voices understanding of current disease status and treatment options and is in agreement with the current care plan. All questions were answered. The patient knows to call the clinic with any problems, questions or concerns. We can certainly see the patient much sooner if necessary.  Disclaimer: This note was dictated with voice recognition software. Similar sounding words can inadvertently be transcribed and may not be corrected upon review.

## 2018-04-19 NOTE — Telephone Encounter (Signed)
Scheduled appt per 12/23 los - added additional cycles per los - pt to get an updated schedule next visit.

## 2018-04-19 NOTE — Patient Instructions (Signed)
[facility] Discharge Instructions for Patients Receiving Chemotherapy  Today you received the following chemotherapy agents: Imfinzi.  To help prevent nausea and vomiting after your treatment, we encourage you to take your nausea medication as directed.   If you develop nausea and vomiting that is not controlled by your nausea medication, call the clinic.   BELOW ARE SYMPTOMS THAT SHOULD BE REPORTED IMMEDIATELY:  *FEVER GREATER THAN 100.5 F  *CHILLS WITH OR WITHOUT FEVER  NAUSEA AND VOMITING THAT IS NOT CONTROLLED WITH YOUR NAUSEA MEDICATION  *UNUSUAL SHORTNESS OF BREATH  *UNUSUAL BRUISING OR BLEEDING  TENDERNESS IN MOUTH AND THROAT WITH OR WITHOUT PRESENCE OF ULCERS  *URINARY PROBLEMS  *BOWEL PROBLEMS  UNUSUAL RASH Items with * indicate a potential emergency and should be followed up as soon as possible.  Feel free to call the clinic should you have any questions or concerns. The clinic phone number is [phone].  Please show the CHEMO ALERT CARD at check-in to the Emergency Department and triage nurse.   

## 2018-04-29 ENCOUNTER — Ambulatory Visit (HOSPITAL_COMMUNITY)
Admission: RE | Admit: 2018-04-29 | Discharge: 2018-04-29 | Disposition: A | Discharge status: Home / Self Care | Payer: BC Managed Care – PPO | Source: Ambulatory Visit | Attending: Internal Medicine | Admitting: Internal Medicine

## 2018-04-29 DIAGNOSIS — C3490 Malignant neoplasm of unspecified part of unspecified bronchus or lung: Secondary | ICD-10-CM | POA: Diagnosis present

## 2018-04-29 MED ORDER — IOHEXOL 300 MG/ML  SOLN
75.0000 mL | Freq: Once | INTRAMUSCULAR | Status: AC | PRN
  Administered 2018-04-29: 75 mL via INTRAVENOUS

## 2018-04-29 MED ORDER — SODIUM CHLORIDE (PF) 0.9 % IJ SOLN
INTRAMUSCULAR | Status: AC
  Filled 2018-04-29: qty 50

## 2018-04-30 ENCOUNTER — Ambulatory Visit (HOSPITAL_COMMUNITY): Payer: BC Managed Care – PPO

## 2018-05-03 ENCOUNTER — Inpatient Hospital Stay: Payer: BC Managed Care – PPO

## 2018-05-03 ENCOUNTER — Inpatient Hospital Stay (HOSPITAL_BASED_OUTPATIENT_CLINIC_OR_DEPARTMENT_OTHER): Payer: BC Managed Care – PPO | Admitting: Internal Medicine

## 2018-05-03 ENCOUNTER — Encounter: Payer: Self-pay | Admitting: Internal Medicine

## 2018-05-03 ENCOUNTER — Inpatient Hospital Stay: Payer: BC Managed Care – PPO | Attending: Internal Medicine

## 2018-05-03 VITALS — BP 136/70 | HR 98 | Resp 18

## 2018-05-03 VITALS — BP 158/83 | HR 95 | Temp 98.2°F | Resp 18 | Ht 60.0 in | Wt 117.5 lb

## 2018-05-03 DIAGNOSIS — C3491 Malignant neoplasm of unspecified part of right bronchus or lung: Secondary | ICD-10-CM

## 2018-05-03 DIAGNOSIS — Z79899 Other long term (current) drug therapy: Secondary | ICD-10-CM | POA: Insufficient documentation

## 2018-05-03 DIAGNOSIS — Z5112 Encounter for antineoplastic immunotherapy: Secondary | ICD-10-CM | POA: Insufficient documentation

## 2018-05-03 DIAGNOSIS — M069 Rheumatoid arthritis, unspecified: Secondary | ICD-10-CM | POA: Diagnosis not present

## 2018-05-03 DIAGNOSIS — J700 Acute pulmonary manifestations due to radiation: Secondary | ICD-10-CM | POA: Insufficient documentation

## 2018-05-03 DIAGNOSIS — R1310 Dysphagia, unspecified: Secondary | ICD-10-CM | POA: Diagnosis not present

## 2018-05-03 DIAGNOSIS — I10 Essential (primary) hypertension: Secondary | ICD-10-CM | POA: Insufficient documentation

## 2018-05-03 DIAGNOSIS — C3431 Malignant neoplasm of lower lobe, right bronchus or lung: Secondary | ICD-10-CM

## 2018-05-03 DIAGNOSIS — F17200 Nicotine dependence, unspecified, uncomplicated: Secondary | ICD-10-CM

## 2018-05-03 LAB — CBC WITH DIFFERENTIAL (CANCER CENTER ONLY)
Abs Immature Granulocytes: 0.02 10*3/uL (ref 0.00–0.07)
Basophils Absolute: 0 10*3/uL (ref 0.0–0.1)
Basophils Relative: 0 %
Eosinophils Absolute: 0.1 10*3/uL (ref 0.0–0.5)
Eosinophils Relative: 1 %
HCT: 34.5 % — ABNORMAL LOW (ref 36.0–46.0)
Hemoglobin: 11.2 g/dL — ABNORMAL LOW (ref 12.0–15.0)
Immature Granulocytes: 0 %
Lymphocytes Relative: 18 %
Lymphs Abs: 1.4 10*3/uL (ref 0.7–4.0)
MCH: 32.6 pg (ref 26.0–34.0)
MCHC: 32.5 g/dL (ref 30.0–36.0)
MCV: 100.3 fL — ABNORMAL HIGH (ref 80.0–100.0)
Monocytes Absolute: 0.6 10*3/uL (ref 0.1–1.0)
Monocytes Relative: 8 %
Neutro Abs: 5.4 10*3/uL (ref 1.7–7.7)
Neutrophils Relative %: 73 %
Platelet Count: 305 10*3/uL (ref 150–400)
RBC: 3.44 MIL/uL — ABNORMAL LOW (ref 3.87–5.11)
RDW: 13.9 % (ref 11.5–15.5)
WBC Count: 7.5 10*3/uL (ref 4.0–10.5)
nRBC: 0 % (ref 0.0–0.2)

## 2018-05-03 LAB — CMP (CANCER CENTER ONLY)
ALT: 29 U/L (ref 0–44)
AST: 34 U/L (ref 15–41)
Albumin: 3.3 g/dL — ABNORMAL LOW (ref 3.5–5.0)
Alkaline Phosphatase: 113 U/L (ref 38–126)
Anion gap: 8 (ref 5–15)
BUN: 13 mg/dL (ref 8–23)
CO2: 24 mmol/L (ref 22–32)
Calcium: 9.6 mg/dL (ref 8.9–10.3)
Chloride: 107 mmol/L (ref 98–111)
Creatinine: 0.87 mg/dL (ref 0.44–1.00)
GFR, Est AFR Am: >60 mL/min (ref >60)
GFR, Estimated: >60 mL/min (ref >60)
Glucose, Bld: 85 mg/dL (ref 70–99)
Potassium: 4 mmol/L (ref 3.5–5.1)
Sodium: 139 mmol/L (ref 135–145)
Total Bilirubin: <0.2 mg/dL — ABNORMAL LOW (ref 0.3–1.2)
Total Protein: 7.3 g/dL (ref 6.5–8.1)

## 2018-05-03 LAB — TSH: TSH: 2.064 u[IU]/mL (ref 0.308–3.960)

## 2018-05-03 MED ORDER — SODIUM CHLORIDE 0.9 % IV SOLN
Freq: Once | INTRAVENOUS | Status: AC
  Administered 2018-05-03: 13:00:00 via INTRAVENOUS
  Filled 2018-05-03: qty 250

## 2018-05-03 MED ORDER — SODIUM CHLORIDE 0.9 % IV SOLN
500.0000 mg | Freq: Once | INTRAVENOUS | Status: AC
  Administered 2018-05-03: 500 mg via INTRAVENOUS
  Filled 2018-05-03: qty 10

## 2018-05-03 NOTE — Patient Instructions (Signed)
[facility] Discharge Instructions for Patients Receiving Chemotherapy  Today you received the following chemotherapy agents Durvalumab (IMFINZI).   To help prevent nausea and vomiting after your treatment, we encourage you to take your nausea medication as prescribed.   If you develop nausea and vomiting that is not controlled by your nausea medication, call the clinic.   BELOW ARE SYMPTOMS THAT SHOULD BE REPORTED IMMEDIATELY:  *FEVER GREATER THAN 100.5 F  *CHILLS WITH OR WITHOUT FEVER  NAUSEA AND VOMITING THAT IS NOT CONTROLLED WITH YOUR NAUSEA MEDICATION  *UNUSUAL SHORTNESS OF BREATH  *UNUSUAL BRUISING OR BLEEDING  TENDERNESS IN MOUTH AND THROAT WITH OR WITHOUT PRESENCE OF ULCERS  *URINARY PROBLEMS  *BOWEL PROBLEMS  UNUSUAL RASH Items with * indicate a potential emergency and should be followed up as soon as possible.  Feel free to call the clinic should you have any questions or concerns. The clinic phone number is [phone].  Please show the [facility] at check-in to the Emergency Department and triage nurse.

## 2018-05-03 NOTE — Progress Notes (Signed)
[place] Telephone:([phone]   Fax:(336) [phone]  OFFICE PROGRESS NOTE  Default, Provider, MD No address on file  DIAGNOSIS: Stage IIIA (T3, N1/N2, M0) non-small cell lung cancer, adenocarcinoma presented with large right lower lobe lung mass with extension to the right hilum and subcarinal area diagnosed in July 2019.  Biomarker Findings Tumor Mutational Burden - TMB-Intermediate (6 Muts/Mb) Microsatellite status - MS-Stable Genomic Findings For a complete list of the genes assayed, please refer to the Appendix. NRAS Q61R ARAF amplification STK11 G56W KRAS G13D MYCN amplification MCL1 amplification NKX2-1 amplification - equivocal? TP53 G245V 7 Disease relevant genes with no reportable alterations: EGFR, ALK, BRAF, MET, ERBB2, RET, ROS1   PRIOR THERAPY: Course of concurrent chemoradiation with weekly carboplatin for AUC of 2 and paclitaxel 45 mg/M2.  Status post 7 cycles.  Last dose was giving 01/11/2018.  CURRENT THERAPY: Consolidation treatment with immunotherapy with Imfinzi (Durvalumab) 10 mg/KG every 2 weeks.  First dose February 09, 2018.  Status post 6 cycles.  INTERVAL HISTORY: Susan Holder 63 y.o. female returns to the clinic today for follow-up visit.  The patient is feeling fine today with no concerning complaints except for sore throat and postnasal drainage.  She denied having any fever or chills.  She has no nausea, vomiting, diarrhea or constipation.  She denied having any chest pain but continues to have mild cough with no shortness of breath or hemoptysis.  She has no recent weight loss or night sweats.  She has been tolerating her treatment with Imfinzi fairly well.  The patient had repeat CT scan of the chest performed recently and she is here for evaluation and discussion of her scan results.   MEDICAL HISTORY: Past Medical History:  Diagnosis Date  . Rheumatoid arthritis ([place])     ALLERGIES:  has No Known  Allergies.  MEDICATIONS:  Current Outpatient Medications  Medication Sig Dispense Refill  . albuterol (PROVENTIL HFA) 108 (90 Base) MCG/ACT inhaler Inhale 1 puff into the lungs every 6 (six) hours as needed for wheezing or shortness of breath.     Marland Kitchen aspirin EC 81 MG tablet Take 81 mg by mouth daily.    [name] (IMFINZI IV) Inject into the vein every 14 (fourteen) days.    . famotidine (PEPCID) 40 MG tablet Take 40 mg by mouth as needed.     . hydroxychloroquine (PLAQUENIL) 200 MG tablet TAKE 1 TABLET BY MOUTH TWICE DAILY Monday THROUGH FRIDAY 40 tablet 2  . naproxen sodium (ALEVE) 220 MG tablet Take 220 mg by mouth daily as needed (PAIN).    Marland Kitchen Phenylephrine-DM-GG-APAP (DELSYM COUGH/COLD DAYTIME PO) Take by mouth as needed.     . predniSONE (DELTASONE) 5 MG tablet Take 4 tabs po x 2 days, 3  tabs po x 2 days, 2  tabs po x 2 days, 1  tab po x 2 days 20 tablet 0  . Probiotic Product (ALIGN PO) Take 1 capsule by mouth at bedtime.    . pseudoephedrine-guaifenesin (MUCINEX D) 60-600 MG 12 hr tablet Take 1 tablet by mouth as needed.     . Vitamin D, Ergocalciferol, (DRISDOL) 1.25 MG (50000 UT) CAPS capsule Take 1 capsule (50,000 Units total) by mouth every 7 (seven) days. 12 capsule 0   No current facility-administered medications for this visit.     SURGICAL HISTORY:  Past Surgical History:  Procedure Laterality Date  . BACK SURGERY    . BRONCHIAL NEEDLE ASPIRATION BIOPSY  11/09/2017  Procedure: BRONCHIAL NEEDLE ASPIRATION BIOPSIES;  Surgeon: Marshell Garfinkel, MD;  Location: [facility];  Service: Cardiopulmonary;;  . ENDOBRONCHIAL ULTRASOUND Bilateral 11/09/2017   Procedure: ENDOBRONCHIAL ULTRASOUND;  Surgeon: Marshell Garfinkel, MD;  Location: [facility];  Service: Cardiopulmonary;  Laterality: Bilateral;  . NASAL SINUS SURGERY    . NASAL SINUS SURGERY    . TUBAL LIGATION    . VIDEO BRONCHOSCOPY  11/09/2017   Procedure: VIDEO BRONCHOSCOPY;  Surgeon: Marshell Garfinkel, MD;  Location: [facility];  Service: Cardiopulmonary;;    REVIEW OF SYSTEMS:  Constitutional: negative Eyes: negative Ears, nose, mouth, throat, and face: positive for sore throat Respiratory: positive for cough Cardiovascular: negative Gastrointestinal: negative Genitourinary:negative Integument/breast: negative Hematologic/lymphatic: negative Musculoskeletal:negative Neurological: negative Behavioral/Psych: negative Endocrine: negative Allergic/Immunologic: negative   PHYSICAL EXAMINATION: General appearance: alert, cooperative and no distress Head: Normocephalic, without obvious abnormality, atraumatic Neck: no adenopathy, no JVD, supple, symmetrical, trachea midline and thyroid not enlarged, symmetric, no tenderness/mass/nodules Lymph nodes: Cervical, supraclavicular, and axillary nodes normal. Resp: clear to auscultation bilaterally Back: symmetric, no curvature. ROM normal. No CVA tenderness. Cardio: regular rate and rhythm, S1, S2 normal, no murmur, click, rub or gallop GI: soft, non-tender; bowel sounds normal; no masses,  no organomegaly Extremities: extremities normal, atraumatic, no cyanosis or edema Neurologic: Alert and oriented X 3, normal strength and tone. Normal symmetric reflexes. Normal coordination and gait  ECOG PERFORMANCE STATUS: 1 - Symptomatic but completely ambulatory  Blood pressure (!) 158/83, pulse 95, temperature 98.2 F (36.8 C), temperature source Oral, resp. rate 18, height 5' (1.524 m), weight 117 lb 8 oz (53.3 kg), last menstrual period 04/28/2000, SpO2 100 %.  LABORATORY DATA: Lab Results  Component Value Date   WBC 7.5 05/03/2018   HGB 11.2 (L) 05/03/2018   HCT 34.5 (L) 05/03/2018   MCV 100.3 (H) 05/03/2018   PLT 305 05/03/2018      Chemistry      Component Value Date/Time   NA 139 04/19/2018 1207   K 4.3 04/19/2018 1207   CL 107 04/19/2018 1207   CO2 24 04/19/2018 1207   BUN 10 04/19/2018 1207   CREATININE 1.00 04/19/2018 1207   CREATININE  0.80 07/24/2017 1438      Component Value Date/Time   CALCIUM 9.3 04/19/2018 1207   ALKPHOS 98 04/19/2018 1207   AST 23 04/19/2018 1207   ALT 16 04/19/2018 1207   BILITOT 0.3 04/19/2018 1207       RADIOGRAPHIC STUDIES: Ct Chest W Contrast  Result Date: 04/29/2018 CLINICAL DATA:  Patient with history of right lung cancer. Follow-up exam. EXAM: CT CHEST WITH CONTRAST TECHNIQUE: Multidetector CT imaging of the chest was performed during intravenous contrast administration. CONTRAST:  82m OMNIPAQUE IOHEXOL 300 MG/ML  SOLN COMPARISON:  CT chest 01/29/2018 FINDINGS: Cardiovascular: Normal heart size. Trace pericardial effusion. Thoracic aortic vascular calcifications. Mediastinum/Nodes: New 7 mm superior mediastinal lymph node (image 29; series 2). Interval decrease in size of subcarinal portion of the mass measuring 1.3 cm (image 50; series 2), previously 2.3 cm. Normal appearance of the esophagus. Lungs/Pleura: Central airways are patent. Centrilobular and paraseptal emphysematous change. Interval development of patchy ground-glass nodularity within the lingula and left upper lobe (image 40 4-47; series 5). Interval development of peripheral ground-glass and consolidative opacities within the right lower lobe (image 90; series 5) (image 96; series 5). Interval development of sharply marginated geographic consolidation within the medial right lower lobe (image 73; series 5). This geographic consolidation extends into the right middle and right upper lobes (  image 48; series 5). Interval decrease in size of right infrahilar lung mass measuring approximately 1.8 x 2.0 cm (image 56; series 2), previously 3.0 x 3.8 cm. Upper Abdomen: Unremarkable. Musculoskeletal: No aggressive or acute appearing osseous lesions. Multilevel degenerative changes. IMPRESSION: 1. Interval decrease in size of right infrahilar mass including the portion extending into the subcarinal location. 2. Interval development of sharply  marginated consolidation throughout the right middle, right upper and right lower lobes which may represent evolving postradiation changes. 3. Interval development of peripheral patchy ground-glass and consolidative opacities within the left upper and left lower lobes favored represent an infectious/inflammatory process. Recommend attention on follow-up as metastatic disease is not excluded. 4. Aortic Atherosclerosis (ICD10-I70.0) and Emphysema (ICD10-J43.9). Electronically Signed   By: Lovey Newcomer M.D.   On: 04/29/2018 15:07   Xr Shoulder Right  Result Date: 04/06/2018 No glenohumeral joint space narrowing was noted.  No acromioclavicular joint space narrowing was noted.  No chondrocalcinosis was noted.  She has some changes in her right lung due to underlying adenocarcinoma. Impression: Unremarkable x-ray of the right shoulder joint.   ASSESSMENT AND PLAN: This is a very pleasant 63 years old African-American female recently diagnosed with a stage IIIA non-small cell lung cancer, adenocarcinoma.  She underwent a course of concurrent chemoradiation with weekly carboplatin and paclitaxel status post 7 cycles with partial response.   The patient tolerated this course of treatment well except for mild odynophagia and dysphagia. She is currently on consolidation treatment with immunotherapy with Imfinzi (Durvalumab) status post 6 cycles. The patient has been tolerating this treatment well with no concerning adverse effects. She had repeat CT scan of the chest performed recently.  I personally and independently reviewed the scan images and discussed the results with the patient today.  Her scan showed further improvement in her disease with decrease in the size of the right infrahilar lung mass as well as subcarinal lymphadenopathy.  She continues to have persistent radiation-induced pneumonitis. I recommended for the patient to continue her current consolidation treatment with Imfinzi and she will proceed  with cycle #7 today. For hypertension, she was advised to monitor her blood pressure closely at home and to take her blood pressure medication as prescribed. I will see her back for follow-up visit in 2 weeks for evaluation before starting cycle #8. The patient was advised to call immediately if she has any concerning symptoms in the interval. The patient voices understanding of current disease status and treatment options and is in agreement with the current care plan. All questions were answered. The patient knows to call the clinic with any problems, questions or concerns. We can certainly see the patient much sooner if necessary.  Disclaimer: This note was dictated with voice recognition software. Similar sounding words can inadvertently be transcribed and may not be corrected upon review.

## 2018-05-17 ENCOUNTER — Telehealth: Payer: Self-pay | Admitting: Internal Medicine

## 2018-05-17 ENCOUNTER — Inpatient Hospital Stay: Payer: BC Managed Care – PPO

## 2018-05-17 ENCOUNTER — Encounter: Payer: Self-pay | Admitting: Internal Medicine

## 2018-05-17 ENCOUNTER — Inpatient Hospital Stay: Payer: BC Managed Care – PPO | Admitting: Internal Medicine

## 2018-05-17 VITALS — BP 139/83 | HR 92 | Temp 98.4°F | Resp 18 | Ht 60.0 in | Wt 119.3 lb

## 2018-05-17 DIAGNOSIS — C3491 Malignant neoplasm of unspecified part of right bronchus or lung: Secondary | ICD-10-CM

## 2018-05-17 DIAGNOSIS — M069 Rheumatoid arthritis, unspecified: Secondary | ICD-10-CM

## 2018-05-17 DIAGNOSIS — Z5112 Encounter for antineoplastic immunotherapy: Secondary | ICD-10-CM | POA: Diagnosis not present

## 2018-05-17 DIAGNOSIS — C3431 Malignant neoplasm of lower lobe, right bronchus or lung: Secondary | ICD-10-CM

## 2018-05-17 DIAGNOSIS — R1310 Dysphagia, unspecified: Secondary | ICD-10-CM

## 2018-05-17 LAB — CBC WITH DIFFERENTIAL (CANCER CENTER ONLY)
Abs Immature Granulocytes: 0.02 10*3/uL (ref 0.00–0.07)
Basophils Absolute: 0 10*3/uL (ref 0.0–0.1)
Basophils Relative: 0 %
Eosinophils Absolute: 0 10*3/uL (ref 0.0–0.5)
Eosinophils Relative: 1 %
HCT: 37.8 % (ref 36.0–46.0)
Hemoglobin: 12.3 g/dL (ref 12.0–15.0)
Immature Granulocytes: 0 %
Lymphocytes Relative: 22 %
Lymphs Abs: 1.4 10*3/uL (ref 0.7–4.0)
MCH: 32.2 pg (ref 26.0–34.0)
MCHC: 32.5 g/dL (ref 30.0–36.0)
MCV: 99 fL (ref 80.0–100.0)
Monocytes Absolute: 0.7 10*3/uL (ref 0.1–1.0)
Monocytes Relative: 12 %
Neutro Abs: 4 10*3/uL (ref 1.7–7.7)
Neutrophils Relative %: 65 %
Platelet Count: 293 10*3/uL (ref 150–400)
RBC: 3.82 MIL/uL — ABNORMAL LOW (ref 3.87–5.11)
RDW: 13.5 % (ref 11.5–15.5)
WBC Count: 6.2 10*3/uL (ref 4.0–10.5)
nRBC: 0 % (ref 0.0–0.2)

## 2018-05-17 LAB — CMP (CANCER CENTER ONLY)
ALT: 34 U/L (ref 0–44)
AST: 36 U/L (ref 15–41)
Albumin: 3.4 g/dL — ABNORMAL LOW (ref 3.5–5.0)
Alkaline Phosphatase: 118 U/L (ref 38–126)
Anion gap: 8 (ref 5–15)
BUN: 15 mg/dL (ref 8–23)
CO2: 23 mmol/L (ref 22–32)
Calcium: 9.7 mg/dL (ref 8.9–10.3)
Chloride: 107 mmol/L (ref 98–111)
Creatinine: 0.78 mg/dL (ref 0.44–1.00)
GFR, Est AFR Am: >60 mL/min (ref >60)
GFR, Estimated: >60 mL/min (ref >60)
Glucose, Bld: 87 mg/dL (ref 70–99)
Potassium: 4.2 mmol/L (ref 3.5–5.1)
Sodium: 138 mmol/L (ref 135–145)
Total Bilirubin: <0.2 mg/dL — ABNORMAL LOW (ref 0.3–1.2)
Total Protein: 7.6 g/dL (ref 6.5–8.1)

## 2018-05-17 MED ORDER — SODIUM CHLORIDE 0.9 % IV SOLN
Freq: Once | INTRAVENOUS | Status: AC
  Administered 2018-05-17: 10:00:00 via INTRAVENOUS
  Filled 2018-05-17: qty 250

## 2018-05-17 MED ORDER — SODIUM CHLORIDE 0.9 % IV SOLN
500.0000 mg | Freq: Once | INTRAVENOUS | Status: AC
  Administered 2018-05-17: 500 mg via INTRAVENOUS
  Filled 2018-05-17: qty 10

## 2018-05-17 NOTE — Patient Instructions (Signed)
[facility] Discharge Instructions for Patients Receiving Chemotherapy  Today you received the following chemotherapy agents: Imfinzi.  To help prevent nausea and vomiting after your treatment, we encourage you to take your nausea medication as directed.   If you develop nausea and vomiting that is not controlled by your nausea medication, call the clinic.   BELOW ARE SYMPTOMS THAT SHOULD BE REPORTED IMMEDIATELY:  *FEVER GREATER THAN 100.5 F  *CHILLS WITH OR WITHOUT FEVER  NAUSEA AND VOMITING THAT IS NOT CONTROLLED WITH YOUR NAUSEA MEDICATION  *UNUSUAL SHORTNESS OF BREATH  *UNUSUAL BRUISING OR BLEEDING  TENDERNESS IN MOUTH AND THROAT WITH OR WITHOUT PRESENCE OF ULCERS  *URINARY PROBLEMS  *BOWEL PROBLEMS  UNUSUAL RASH Items with * indicate a potential emergency and should be followed up as soon as possible.  Feel free to call the clinic should you have any questions or concerns. The clinic phone number is [phone].  Please show the CHEMO ALERT CARD at check-in to the Emergency Department and triage nurse.   

## 2018-05-17 NOTE — Telephone Encounter (Signed)
Added additional cycles per 1/20 los - pt to get an update schedule next visit.

## 2018-05-17 NOTE — Progress Notes (Signed)
[facility] [phone]   [phone]  OFFICE PROGRESS NOTE  Default, Provider, MD No address on file  DIAGNOSIS: Stage IIIA (T3, N1/N2, M0) non-small cell lung cancer, adenocarcinoma presented with large right lower lobe lung mass with extension to the right hilum and subcarinal area diagnosed in July 2019.  Biomarker Findings Tumor Mutational Burden - TMB-Intermediate (6 Muts/Mb) Microsatellite status - MS-Stable Genomic Findings For a complete list of the genes assayed, please refer to the Appendix. NRAS Q61R ARAF amplification STK11 G56W KRAS G13D MYCN amplification MCL1 amplification NKX2-1 amplification - equivocal? TP53 G245V 7 Disease relevant genes with no reportable alterations: EGFR, ALK, BRAF, MET, ERBB2, RET, ROS1   PRIOR THERAPY: Course of concurrent chemoradiation with weekly carboplatin for AUC of 2 and paclitaxel 45 mg/M2.  Status post 7 cycles.  Last dose was giving 01/11/2018.  CURRENT THERAPY: Consolidation treatment with immunotherapy with Imfinzi (Durvalumab) 10 mg/KG every 2 weeks.  First dose February 09, 2018.  Status post 7 cycles.  INTERVAL HISTORY: Susan Holder 63 y.o. female returns to the clinic today for follow-up visit.  The patient is feeling fine today with no concerning complaints except for arthralgia in her knees bilaterally.  She denied having any chest pain, shortness of breath, cough or hemoptysis.  She denied having any fever or chills.  She has no nausea, vomiting, diarrhea or constipation.  She continues to tolerate her treatment with Imfinzi fairly well.  The patient is here today for evaluation before starting cycle #8.   MEDICAL HISTORY: Past Medical History:  Diagnosis Date  . Rheumatoid arthritis ([place])     ALLERGIES:  has No Known Allergies.  MEDICATIONS:  Current Outpatient Medications  Medication Sig Dispense Refill  . albuterol (PROVENTIL HFA) 108 (90 Base) MCG/ACT inhaler Inhale  1 puff into the lungs every 6 (six) hours as needed for wheezing or shortness of breath.     Marland Kitchen aspirin EC 81 MG tablet Take 81 mg by mouth daily.    [name] (IMFINZI IV) Inject into the vein every 14 (fourteen) days.    . famotidine (PEPCID) 40 MG tablet Take 40 mg by mouth as needed.     . hydroxychloroquine (PLAQUENIL) 200 MG tablet TAKE 1 TABLET BY MOUTH TWICE DAILY Monday THROUGH FRIDAY 40 tablet 2  . naproxen sodium (ALEVE) 220 MG tablet Take 220 mg by mouth daily as needed (PAIN).    Marland Kitchen Phenylephrine-DM-GG-APAP (DELSYM COUGH/COLD DAYTIME PO) Take by mouth as needed.     . pseudoephedrine-guaifenesin (MUCINEX D) 60-600 MG 12 hr tablet Take 1 tablet by mouth as needed.     . Vitamin D, Ergocalciferol, (DRISDOL) 1.25 MG (50000 UT) CAPS capsule Take 1 capsule (50,000 Units total) by mouth every 7 (seven) days. 12 capsule 0   No current facility-administered medications for this visit.     SURGICAL HISTORY:  Past Surgical History:  Procedure Laterality Date  . BACK SURGERY    . BRONCHIAL NEEDLE ASPIRATION BIOPSY  11/09/2017   Procedure: BRONCHIAL NEEDLE ASPIRATION BIOPSIES;  Surgeon: Marshell Garfinkel, MD;  Location: [facility];  Service: Cardiopulmonary;;  . ENDOBRONCHIAL ULTRASOUND Bilateral 11/09/2017   Procedure: ENDOBRONCHIAL ULTRASOUND;  Surgeon: Marshell Garfinkel, MD;  Location: [facility];  Service: Cardiopulmonary;  Laterality: Bilateral;  . NASAL SINUS SURGERY    . NASAL SINUS SURGERY    . TUBAL LIGATION    . VIDEO BRONCHOSCOPY  11/09/2017   Procedure: VIDEO BRONCHOSCOPY;  Surgeon: Marshell Garfinkel, MD;  Location: [facility];  Service: Cardiopulmonary;;    REVIEW OF SYSTEMS:  A comprehensive review of systems was negative except for: Musculoskeletal: positive for arthralgias   PHYSICAL EXAMINATION: General appearance: alert, cooperative and no distress Head: Normocephalic, without obvious abnormality, atraumatic Neck: no adenopathy, no JVD, supple, symmetrical, trachea  midline and thyroid not enlarged, symmetric, no tenderness/mass/nodules Lymph nodes: Cervical, supraclavicular, and axillary nodes normal. Resp: clear to auscultation bilaterally Back: symmetric, no curvature. ROM normal. No CVA tenderness. Cardio: regular rate and rhythm, S1, S2 normal, no murmur, click, rub or gallop GI: soft, non-tender; bowel sounds normal; no masses,  no organomegaly Extremities: extremities normal, atraumatic, no cyanosis or edema  ECOG PERFORMANCE STATUS: 1 - Symptomatic but completely ambulatory  Blood pressure 139/83, pulse 92, temperature 98.4 F (36.9 C), temperature source Oral, resp. rate 18, height 5' (1.524 m), weight 119 lb 4.8 oz (54.1 kg), last menstrual period 04/28/2000, SpO2 100 %.  LABORATORY DATA: Lab Results  Component Value Date   WBC 6.2 05/17/2018   HGB 12.3 05/17/2018   HCT 37.8 05/17/2018   MCV 99.0 05/17/2018   PLT 293 05/17/2018      Chemistry      Component Value Date/Time   NA 138 05/17/2018 0845   K 4.2 05/17/2018 0845   CL 107 05/17/2018 0845   CO2 23 05/17/2018 0845   BUN 15 05/17/2018 0845   CREATININE 0.78 05/17/2018 0845   CREATININE 0.80 07/24/2017 1438      Component Value Date/Time   CALCIUM 9.7 05/17/2018 0845   ALKPHOS 118 05/17/2018 0845   AST 36 05/17/2018 0845   ALT 34 05/17/2018 0845   BILITOT <0.2 (L) 05/17/2018 0845       RADIOGRAPHIC STUDIES: Ct Chest W Contrast  Result Date: 04/29/2018 CLINICAL DATA:  Patient with history of right lung cancer. Follow-up exam. EXAM: CT CHEST WITH CONTRAST TECHNIQUE: Multidetector CT imaging of the chest was performed during intravenous contrast administration. CONTRAST:  68m OMNIPAQUE IOHEXOL 300 MG/ML  SOLN COMPARISON:  CT chest 01/29/2018 FINDINGS: Cardiovascular: Normal heart size. Trace pericardial effusion. Thoracic aortic vascular calcifications. Mediastinum/Nodes: New 7 mm superior mediastinal lymph node (image 29; series 2). Interval decrease in size of  subcarinal portion of the mass measuring 1.3 cm (image 50; series 2), previously 2.3 cm. Normal appearance of the esophagus. Lungs/Pleura: Central airways are patent. Centrilobular and paraseptal emphysematous change. Interval development of patchy ground-glass nodularity within the lingula and left upper lobe (image 40 4-47; series 5). Interval development of peripheral ground-glass and consolidative opacities within the right lower lobe (image 90; series 5) (image 96; series 5). Interval development of sharply marginated geographic consolidation within the medial right lower lobe (image 73; series 5). This geographic consolidation extends into the right middle and right upper lobes (image 48; series 5). Interval decrease in size of right infrahilar lung mass measuring approximately 1.8 x 2.0 cm (image 56; series 2), previously 3.0 x 3.8 cm. Upper Abdomen: Unremarkable. Musculoskeletal: No aggressive or acute appearing osseous lesions. Multilevel degenerative changes. IMPRESSION: 1. Interval decrease in size of right infrahilar mass including the portion extending into the subcarinal location. 2. Interval development of sharply marginated consolidation throughout the right middle, right upper and right lower lobes which may represent evolving postradiation changes. 3. Interval development of peripheral patchy ground-glass and consolidative opacities within the left upper and left lower lobes favored represent an infectious/inflammatory process. Recommend attention on follow-up as metastatic disease is not excluded. 4. Aortic Atherosclerosis (ICD10-I70.0) and Emphysema (ICD10-J43.9).  Electronically Signed   By: Lovey Newcomer M.D.   On: 04/29/2018 15:07    ASSESSMENT AND PLAN: This is a very pleasant 63 years old African-American female recently diagnosed with a stage IIIA non-small cell lung cancer, adenocarcinoma.  She underwent a course of concurrent chemoradiation with weekly carboplatin and paclitaxel status  post 7 cycles with partial response.   The patient tolerated this course of treatment well except for mild odynophagia and dysphagia. She is currently on consolidation treatment with immunotherapy with Imfinzi (Durvalumab) status post 7 cycles. The patient is feeling fine today with no concerning complaints except for arthralgia in her knees. She has been tolerating her treatment with Imfinzi fairly well. I recommended for her to proceed with cycle #8 today as scheduled. I will see her back for follow-up visit in 2 weeks for evaluation before the next cycle of her treatment. The patient was advised to call immediately if she has any concerning symptoms in the interval. The patient voices understanding of current disease status and treatment options and is in agreement with the current care plan. All questions were answered. The patient knows to call the clinic with any problems, questions or concerns. We can certainly see the patient much sooner if necessary.  Disclaimer: This note was dictated with voice recognition software. Similar sounding words can inadvertently be transcribed and may not be corrected upon review.

## 2018-05-18 ENCOUNTER — Other Ambulatory Visit: Payer: Self-pay | Admitting: Physician Assistant

## 2018-05-18 NOTE — Telephone Encounter (Signed)
Last Visit: 04/06/18 Next Visit: 07/21/18 Labs: 05/17/18 stable PLQ eye exam: 04/29/2017 Normal.   Okay to refill per Dr. Estanislado Pandy

## 2018-05-26 ENCOUNTER — Other Ambulatory Visit: Payer: Self-pay | Admitting: Rheumatology

## 2018-05-27 NOTE — Telephone Encounter (Signed)
Patient reminded she needs to have labs drawn before she refill. Patient requested an appointment due to joint pain. Patient was offered several appointments for today. She declined. One stating she had to get her granddaughter and other stating she just couldn't;t get here at that time. Patient was offered an appointment for Monday 05/31/18 and declined appointment then as well. Patient was then scheduled for 06/01/18.

## 2018-05-28 NOTE — Progress Notes (Addendum)
Office Visit Note  Patient: Susan Holder             Date of Birth: 1955/10/05           MRN: [id number]             PCP: Default, Provider, MD Referring: No ref. provider found Visit Date: 06/01/2018 Occupation: @GUAROCC @  Subjective:  Pain in multiple joints   History of Present Illness: Susan Holder is a 63 y.o. female with history of seropositive rheumatoid arthritis and DDD.  She takes Plaquenil 200 mg BID M-F. She denies missing any doses recently. She had an appointment on 04/06/18 and had a right shoulder cortisone injection which has improved her symptoms.  She has been having pain in both ankle joints and both knee joints for the past 1 month.  She states she has intermittent joint swelling in the right ankle joint.  She continues to have chronic neck pain. She is having pain in the right thumb and bilateral wrist joints. She states she takes Aleve in the morning which improves her pain and stiffness.      Activities of Daily Living:  Patient reports morning stiffness for all day.   Patient Denies nocturnal pain.  Difficulty dressing/grooming: Denies Difficulty climbing stairs: Denies Difficulty getting out of chair: Denies Difficulty using hands for taps, buttons, cutlery, and/or writing: Reports  Review of Systems  Constitutional: Positive for fatigue.  HENT: Positive for mouth dryness. Negative for mouth sores and nose dryness.   Eyes: Negative for pain, visual disturbance and dryness.  Respiratory: Negative for cough, hemoptysis, shortness of breath and difficulty breathing.   Cardiovascular: Negative for chest pain, palpitations, hypertension and swelling in legs/feet.  Gastrointestinal: Negative for blood in stool, constipation and diarrhea.  Endocrine: Negative for increased urination.  Genitourinary: Negative for painful urination.  Musculoskeletal: Positive for arthralgias, joint pain, joint swelling, morning stiffness and muscle tenderness. Negative  for myalgias, muscle weakness and myalgias.  Skin: Negative for color change, pallor, rash, hair loss, nodules/bumps, skin tightness, ulcers and sensitivity to sunlight.  Allergic/Immunologic: Negative for susceptible to infections.  Neurological: Negative for dizziness, numbness, headaches and weakness.  Hematological: Negative for swollen glands.  Psychiatric/Behavioral: Negative for depressed mood and sleep disturbance. The patient is not nervous/anxious.     PMFS History:  Patient Active Problem List   Diagnosis Date Noted  . Encounter for antineoplastic immunotherapy 02/01/2018  . Primary malignant neoplasm of bronchus of right lower lobe ([place]) 12/21/2017  . Adenocarcinoma of right lung, stage 3 ([place]) 11/17/2017  . Encounter for antineoplastic chemotherapy 11/17/2017  . Goals of care, counseling/discussion 11/17/2017  . Lung mass   . Left hand pain 03/06/2017  . Rheumatoid arthritis involving multiple sites with positive rheumatoid factor ([place]) 05/29/2016  . ANA positive 05/29/2016  . Vitamin D deficiency 05/29/2016  . High risk medication use 05/29/2016  . Trigger finger, left ring finger 05/29/2016  . Trigger finger, right ring finger 05/29/2016  . DDD (degenerative disc disease), cervical 05/29/2016  . Smoker 05/29/2016  . Other fatigue 05/29/2016  . Allergic rhinitis 08/22/2013    Past Medical History:  Diagnosis Date  . Rheumatoid arthritis ([place])     Family History  Problem Relation Age of Onset  . Stroke Mother   . Alzheimer's disease Mother   . Heart disease Mother   . Emphysema Father   . Hypertension Brother   . Heart attack Maternal Aunt   . Heart failure Maternal Grandmother   .  Hypertension Paternal Grandmother    Past Surgical History:  Procedure Laterality Date  . BACK SURGERY    . BRONCHIAL NEEDLE ASPIRATION BIOPSY  11/09/2017   Procedure: BRONCHIAL NEEDLE ASPIRATION BIOPSIES;  Surgeon: Marshell Garfinkel, MD;  Location: [facility];  Service:  Cardiopulmonary;;  . ENDOBRONCHIAL ULTRASOUND Bilateral 11/09/2017   Procedure: ENDOBRONCHIAL ULTRASOUND;  Surgeon: Marshell Garfinkel, MD;  Location: [facility];  Service: Cardiopulmonary;  Laterality: Bilateral;  . NASAL SINUS SURGERY    . NASAL SINUS SURGERY    . TUBAL LIGATION    . VIDEO BRONCHOSCOPY  11/09/2017   Procedure: VIDEO BRONCHOSCOPY;  Surgeon: Marshell Garfinkel, MD;  Location: [facility];  Service: Cardiopulmonary;;   Social History   Social History Narrative  . Not on file   Immunization History  Administered Date(s) Administered  . Influenza,inj,Quad PF,6+ Mos 03/08/2018     Objective: Vital Signs: BP 101/70 (BP Location: Left Arm, Patient Position: Sitting, Cuff Size: Normal)   Pulse (!) 105   Resp 14   Ht 5' (1.524 m)   Wt 122 lb (55.3 kg)   LMP 04/28/2000   BMI 23.83 kg/m    Physical Exam Vitals signs and nursing note reviewed.  Constitutional:      Appearance: She is well-developed.  HENT:     Head: Normocephalic and atraumatic.  Eyes:     Conjunctiva/sclera: Conjunctivae normal.  Neck:     Musculoskeletal: Normal range of motion.  Cardiovascular:     Rate and Rhythm: Normal rate and regular rhythm.     Heart sounds: Normal heart sounds.  Pulmonary:     Effort: Pulmonary effort is normal.     Breath sounds: Normal breath sounds.  Abdominal:     General: Bowel sounds are normal.     Palpations: Abdomen is soft.  Lymphadenopathy:     Cervical: No cervical adenopathy.  Skin:    General: Skin is warm and dry.     Capillary Refill: Capillary refill takes less than 2 seconds.  Neurological:     Mental Status: She is alert and oriented to person, place, and time.  Psychiatric:        Behavior: Behavior normal.      Musculoskeletal Exam: C-spine limited ROM. Thoracic and lumbar spine good ROM.  No midline spinal tenderness.  No SI joint tenderness.  Shoulder joints good ROM with no discomfort.  No warmth or effusion of shoulder joints.  Elbow  joints, wrist joints, MCPs, PIPs, and DIPs good ROM with no synovitis.  Complete fist formation bilaterally. Hip joints, knee joints, ankle joints, MTPs, PIPs, and DIPs good ROM with no synovitis.  No warmth or effusion of knee joints.  No tenderness over trochanteric bursa bilaterally.  No tenderness or swelling of ankle joints.   CDAI Exam: CDAI Score: 1.3  Patient Global Assessment: 0 (mm); Provider Global Assessment: 3 (mm) Swollen: 1 ; Tender: 2  Joint Exam      Right  Left  IP   Tender     Ankle  Swollen Tender        Investigation: No additional findings.  Imaging: No results found.  Recent Labs: Lab Results  Component Value Date   WBC 6.5 05/31/2018   HGB 11.7 (L) 05/31/2018   PLT 321 05/31/2018   NA 137 05/31/2018   K 4.7 05/31/2018   CL 105 05/31/2018   CO2 25 05/31/2018   GLUCOSE 91 05/31/2018   BUN 19 05/31/2018   CREATININE 0.79 05/31/2018   BILITOT <0.2 (L)  05/31/2018   ALKPHOS 136 (H) 05/31/2018   AST 84 (H) 05/31/2018   ALT 82 (H) 05/31/2018   PROT 7.5 05/31/2018   ALBUMIN 3.3 (L) 05/31/2018   CALCIUM 10.0 05/31/2018   GFRAA >60 05/31/2018    Speciality Comments: PLQ eye exam: 04/29/2017 Normal. [facility]. Follow up in 12 months.  Procedures:  No procedures performed Allergies: Patient has no known allergies.   Assessment / Plan:     Visit Diagnoses: Rheumatoid arthritis involving multiple sites with positive rheumatoid factor (HCC) - +RF, +anti-CCP, +ANA with nodulosis: She has no active synovitis on exam.  She has not had any recent rheumatoid arthritis flares.  She presents today with pain in both knee joints and both ankle joints.  She has no tenderness, effusion, warmth, or erythema on exam.  She has intermittent right wrist pain but not tenderness or synovitis was noted.  She will continue on Plaquenil 200 mg BID M-F.  She does not need a refill at this time.  She will follow up in 5 months.   High risk medication use - PLQ 200 mg BID  M-F. CBC and CMP drawn on 05/31/18 were stable. She was given a PLQ eye exam for to take with her to her next visit.  She is past due for this exam.  She had a normal PLQ eye exam on 04/29/17.    Acute pain of right shoulder - XR unremarkable on 04/06/18.  She has good ROM with no discomfort at this time.  No warmth or effusion noted.  She had a cortisone injection on 04/06/18, which resolved her discomfort.   DDD (degenerative disc disease), cervical: She has limited ROM with discomfort.  She takes Aleve for pain relief.  She has no symptoms of radiculopathy at this time.   Vitamin D deficiency - She requested vitamin D to be checked today. Plan: VITAMIN D 25 Hydroxy (Vit-D Deficiency, Fractures)  Other medical conditions are listed as follows:   Adenocarcinoma of right lung, stage 3 (HCC)  Primary malignant neoplasm of bronchus of right lower lobe ([place])   Orders: Orders Placed This Encounter  Procedures  . VITAMIN D 25 Hydroxy (Vit-D Deficiency, Fractures)   No orders of the defined types were placed in this encounter.   Follow-Up Instructions: Return in about 5 months (around 10/30/2018) for Rheumatoid arthritis, DDD.   Ofilia Neas, PA-C  Note - This record has been created using Dragon software.  Chart creation errors have been sought, but may not always  have been located. Such creation errors do not reflect on  the standard of medical care.

## 2018-05-31 ENCOUNTER — Inpatient Hospital Stay: Payer: BC Managed Care – PPO | Admitting: Internal Medicine

## 2018-05-31 ENCOUNTER — Inpatient Hospital Stay: Payer: BC Managed Care – PPO | Attending: Internal Medicine

## 2018-05-31 ENCOUNTER — Encounter: Payer: Self-pay | Admitting: Internal Medicine

## 2018-05-31 ENCOUNTER — Inpatient Hospital Stay: Payer: BC Managed Care – PPO

## 2018-05-31 VITALS — BP 133/77 | HR 99 | Temp 98.7°F | Resp 18 | Ht 61.0 in | Wt 122.8 lb

## 2018-05-31 DIAGNOSIS — C3431 Malignant neoplasm of lower lobe, right bronchus or lung: Secondary | ICD-10-CM | POA: Diagnosis present

## 2018-05-31 DIAGNOSIS — R1310 Dysphagia, unspecified: Secondary | ICD-10-CM | POA: Insufficient documentation

## 2018-05-31 DIAGNOSIS — Z79899 Other long term (current) drug therapy: Secondary | ICD-10-CM | POA: Diagnosis not present

## 2018-05-31 DIAGNOSIS — C3491 Malignant neoplasm of unspecified part of right bronchus or lung: Secondary | ICD-10-CM

## 2018-05-31 DIAGNOSIS — R5383 Other fatigue: Secondary | ICD-10-CM

## 2018-05-31 DIAGNOSIS — Z5112 Encounter for antineoplastic immunotherapy: Secondary | ICD-10-CM | POA: Diagnosis present

## 2018-05-31 DIAGNOSIS — M069 Rheumatoid arthritis, unspecified: Secondary | ICD-10-CM

## 2018-05-31 LAB — CMP (CANCER CENTER ONLY)
ALT: 82 U/L — ABNORMAL HIGH (ref 0–44)
AST: 84 U/L — ABNORMAL HIGH (ref 15–41)
Albumin: 3.3 g/dL — ABNORMAL LOW (ref 3.5–5.0)
Alkaline Phosphatase: 136 U/L — ABNORMAL HIGH (ref 38–126)
Anion gap: 7 (ref 5–15)
BUN: 19 mg/dL (ref 8–23)
CO2: 25 mmol/L (ref 22–32)
Calcium: 10 mg/dL (ref 8.9–10.3)
Chloride: 105 mmol/L (ref 98–111)
Creatinine: 0.79 mg/dL (ref 0.44–1.00)
GFR, Est AFR Am: >60 mL/min (ref >60)
GFR, Estimated: >60 mL/min (ref >60)
Glucose, Bld: 91 mg/dL (ref 70–99)
Potassium: 4.7 mmol/L (ref 3.5–5.1)
Sodium: 137 mmol/L (ref 135–145)
Total Bilirubin: <0.2 mg/dL — ABNORMAL LOW (ref 0.3–1.2)
Total Protein: 7.5 g/dL (ref 6.5–8.1)

## 2018-05-31 LAB — CBC WITH DIFFERENTIAL (CANCER CENTER ONLY)
Abs Immature Granulocytes: 0.01 10*3/uL (ref 0.00–0.07)
Basophils Absolute: 0 10*3/uL (ref 0.0–0.1)
Basophils Relative: 0 %
Eosinophils Absolute: 0.1 10*3/uL (ref 0.0–0.5)
Eosinophils Relative: 1 %
HCT: 36.3 % (ref 36.0–46.0)
Hemoglobin: 11.7 g/dL — ABNORMAL LOW (ref 12.0–15.0)
Immature Granulocytes: 0 %
Lymphocytes Relative: 21 %
Lymphs Abs: 1.4 10*3/uL (ref 0.7–4.0)
MCH: 31.7 pg (ref 26.0–34.0)
MCHC: 32.2 g/dL (ref 30.0–36.0)
MCV: 98.4 fL (ref 80.0–100.0)
Monocytes Absolute: 0.7 10*3/uL (ref 0.1–1.0)
Monocytes Relative: 11 %
Neutro Abs: 4.3 10*3/uL (ref 1.7–7.7)
Neutrophils Relative %: 67 %
Platelet Count: 321 10*3/uL (ref 150–400)
RBC: 3.69 MIL/uL — ABNORMAL LOW (ref 3.87–5.11)
RDW: 13.5 % (ref 11.5–15.5)
WBC Count: 6.5 10*3/uL (ref 4.0–10.5)
nRBC: 0 % (ref 0.0–0.2)

## 2018-05-31 LAB — TSH: TSH: 0.95 u[IU]/mL (ref 0.308–3.960)

## 2018-05-31 MED ORDER — SODIUM CHLORIDE 0.9 % IV SOLN
9.5000 mg/kg | Freq: Once | INTRAVENOUS | Status: AC
  Administered 2018-05-31: 500 mg via INTRAVENOUS
  Filled 2018-05-31: qty 10

## 2018-05-31 MED ORDER — SODIUM CHLORIDE 0.9 % IV SOLN
Freq: Once | INTRAVENOUS | Status: AC
  Administered 2018-05-31: 11:00:00 via INTRAVENOUS
  Filled 2018-05-31: qty 250

## 2018-05-31 NOTE — Patient Instructions (Signed)
[facility] Discharge Instructions for Patients Receiving Chemotherapy  Today you received the following chemotherapy agents: Imfinzi.  To help prevent nausea and vomiting after your treatment, we encourage you to take your nausea medication as directed.   If you develop nausea and vomiting that is not controlled by your nausea medication, call the clinic.   BELOW ARE SYMPTOMS THAT SHOULD BE REPORTED IMMEDIATELY:  *FEVER GREATER THAN 100.5 F  *CHILLS WITH OR WITHOUT FEVER  NAUSEA AND VOMITING THAT IS NOT CONTROLLED WITH YOUR NAUSEA MEDICATION  *UNUSUAL SHORTNESS OF BREATH  *UNUSUAL BRUISING OR BLEEDING  TENDERNESS IN MOUTH AND THROAT WITH OR WITHOUT PRESENCE OF ULCERS  *URINARY PROBLEMS  *BOWEL PROBLEMS  UNUSUAL RASH Items with * indicate a potential emergency and should be followed up as soon as possible.  Feel free to call the clinic should you have any questions or concerns. The clinic phone number is [phone].  Please show the CHEMO ALERT CARD at check-in to the Emergency Department and triage nurse.   

## 2018-05-31 NOTE — Progress Notes (Signed)
[place] [phone]   Fax:(336) [phone]  OFFICE PROGRESS NOTE  Default, Provider, MD No address on file  DIAGNOSIS: Stage IIIA (T3, N1/N2, M0) non-small cell lung cancer, adenocarcinoma presented with large right lower lobe lung mass with extension to the right hilum and subcarinal area diagnosed in July 2019.  Biomarker Findings Tumor Mutational Burden - TMB-Intermediate (6 Muts/Mb) Microsatellite status - MS-Stable Genomic Findings For a complete list of the genes assayed, please refer to the Appendix. NRAS Q61R ARAF amplification STK11 G56W KRAS G13D MYCN amplification MCL1 amplification NKX2-1 amplification - equivocal? TP53 G245V 7 Disease relevant genes with no reportable alterations: EGFR, ALK, BRAF, MET, ERBB2, RET, ROS1   PRIOR THERAPY: Course of concurrent chemoradiation with weekly carboplatin for AUC of 2 and paclitaxel 45 mg/M2.  Status post 7 cycles.  Last dose was giving 01/11/2018.  CURRENT THERAPY: Consolidation treatment with immunotherapy with Imfinzi (Durvalumab) 10 mg/KG every 2 weeks.  First dose February 09, 2018.  Status post 8 cycles.  INTERVAL HISTORY: Susan Holder 63 y.o. female returns to the clinic today for follow-up visit.  The patient is feeling fine today with no specific complaints except for increased arthralgia.  She has a history of rheumatoid arthritis and she started feeling worse recently.  This could be secondary to her treatment with immunotherapy.  She is scheduled to see Dr. Estanislado Pandy tomorrow for evaluation.  She denied having any chest pain, shortness of breath, cough or hemoptysis.  She denied having any fever or chills.  She has no nausea, vomiting, diarrhea or constipation.  She denied having any headache or visual changes.  The patient is here today for evaluation before starting cycle #9 of her treatment.   MEDICAL HISTORY: Past Medical History:  Diagnosis Date  . Rheumatoid arthritis  ([place])     ALLERGIES:  has No Known Allergies.  MEDICATIONS:  Current Outpatient Medications  Medication Sig Dispense Refill  . albuterol (PROVENTIL HFA) 108 (90 Base) MCG/ACT inhaler Inhale 1 puff into the lungs every 6 (six) hours as needed for wheezing or shortness of breath.     Marland Kitchen aspirin EC 81 MG tablet Take 81 mg by mouth daily.    [name] (IMFINZI IV) Inject into the vein every 14 (fourteen) days.    . famotidine (PEPCID) 40 MG tablet Take 40 mg by mouth as needed.     . hydroxychloroquine (PLAQUENIL) 200 MG tablet TAKE 1 TABLET BY MOUTH TWICE DAILY MONDAY THROUGH FRIDAY 40 tablet 2  . naproxen sodium (ALEVE) 220 MG tablet Take 220 mg by mouth daily as needed (PAIN).    Marland Kitchen Phenylephrine-DM-GG-APAP (DELSYM COUGH/COLD DAYTIME PO) Take by mouth as needed.     . pseudoephedrine-guaifenesin (MUCINEX D) 60-600 MG 12 hr tablet Take 1 tablet by mouth as needed.     . Vitamin D, Ergocalciferol, (DRISDOL) 1.25 MG (50000 UT) CAPS capsule Take 1 capsule (50,000 Units total) by mouth every 7 (seven) days. 12 capsule 0   No current facility-administered medications for this visit.     SURGICAL HISTORY:  Past Surgical History:  Procedure Laterality Date  . BACK SURGERY    . BRONCHIAL NEEDLE ASPIRATION BIOPSY  11/09/2017   Procedure: BRONCHIAL NEEDLE ASPIRATION BIOPSIES;  Surgeon: Marshell Garfinkel, MD;  Location: [facility];  Service: Cardiopulmonary;;  . ENDOBRONCHIAL ULTRASOUND Bilateral 11/09/2017   Procedure: ENDOBRONCHIAL ULTRASOUND;  Surgeon: Marshell Garfinkel, MD;  Location: [facility];  Service: Cardiopulmonary;  Laterality: Bilateral;  . NASAL SINUS  SURGERY    . NASAL SINUS SURGERY    . TUBAL LIGATION    . VIDEO BRONCHOSCOPY  11/09/2017   Procedure: VIDEO BRONCHOSCOPY;  Surgeon: Marshell Garfinkel, MD;  Location: [facility];  Service: Cardiopulmonary;;    REVIEW OF SYSTEMS:  A comprehensive review of systems was negative except for: Musculoskeletal: positive for arthralgias    PHYSICAL EXAMINATION: General appearance: alert, cooperative and no distress Head: Normocephalic, without obvious abnormality, atraumatic Neck: no adenopathy, no JVD, supple, symmetrical, trachea midline and thyroid not enlarged, symmetric, no tenderness/mass/nodules Lymph nodes: Cervical, supraclavicular, and axillary nodes normal. Resp: clear to auscultation bilaterally Back: symmetric, no curvature. ROM normal. No CVA tenderness. Cardio: regular rate and rhythm, S1, S2 normal, no murmur, click, rub or gallop GI: soft, non-tender; bowel sounds normal; no masses,  no organomegaly Extremities: extremities normal, atraumatic, no cyanosis or edema  ECOG PERFORMANCE STATUS: 1 - Symptomatic but completely ambulatory  Blood pressure 133/77, pulse 99, temperature 98.7 F (37.1 C), temperature source Oral, resp. rate 18, height 5' 1"  (1.549 m), weight 122 lb 12.8 oz (55.7 kg), last menstrual period 04/28/2000, SpO2 100 %.  LABORATORY DATA: Lab Results  Component Value Date   WBC 6.5 05/31/2018   HGB 11.7 (L) 05/31/2018   HCT 36.3 05/31/2018   MCV 98.4 05/31/2018   PLT 321 05/31/2018      Chemistry      Component Value Date/Time   NA 138 05/17/2018 0845   K 4.2 05/17/2018 0845   CL 107 05/17/2018 0845   CO2 23 05/17/2018 0845   BUN 15 05/17/2018 0845   CREATININE 0.78 05/17/2018 0845   CREATININE 0.80 07/24/2017 1438      Component Value Date/Time   CALCIUM 9.7 05/17/2018 0845   ALKPHOS 118 05/17/2018 0845   AST 36 05/17/2018 0845   ALT 34 05/17/2018 0845   BILITOT <0.2 (L) 05/17/2018 0845       RADIOGRAPHIC STUDIES: No results found.  ASSESSMENT AND PLAN: This is a very pleasant 63 years old African-American female recently diagnosed with a stage IIIA non-small cell lung cancer, adenocarcinoma.  She underwent a course of concurrent chemoradiation with weekly carboplatin and paclitaxel status post 7 cycles with partial response.   The patient tolerated this course of  treatment well except for mild odynophagia and dysphagia. She is currently on consolidation treatment with immunotherapy with Imfinzi (Durvalumab) status post 8 cycles. The patient continues to tolerate this treatment well except for the arthritis.  She is scheduled to see her rheumatologist tomorrow.  The worsening of her arthritis could be secondary to her treatment with immunotherapy.  I discussed with her the option of discontinuing the immunotherapy if her flares are significant but the patient is still interested in continuing her current treatment with immunotherapy. I recommended for her to proceed with cycle #9 today. I will see her back for follow-up visit in 2 weeks for evaluation before the next cycle of her treatment. The patient voices understanding of current disease status and treatment options and is in agreement with the current care plan. All questions were answered. The patient knows to call the clinic with any problems, questions or concerns. We can certainly see the patient much sooner if necessary.  Disclaimer: This note was dictated with voice recognition software. Similar sounding words can inadvertently be transcribed and may not be corrected upon review.

## 2018-05-31 NOTE — Progress Notes (Signed)
Per Dr. Julien Nordmann, okay to treat with AST 84 and ALT 82

## 2018-06-01 ENCOUNTER — Encounter: Payer: Self-pay | Admitting: Physician Assistant

## 2018-06-01 ENCOUNTER — Ambulatory Visit: Payer: BC Managed Care – PPO | Admitting: Physician Assistant

## 2018-06-01 VITALS — BP 101/70 | HR 105 | Resp 14 | Ht 60.0 in | Wt 122.0 lb

## 2018-06-01 DIAGNOSIS — E559 Vitamin D deficiency, unspecified: Secondary | ICD-10-CM

## 2018-06-01 DIAGNOSIS — M5030 Other cervical disc degeneration, unspecified cervical region: Secondary | ICD-10-CM

## 2018-06-01 DIAGNOSIS — M25511 Pain in right shoulder: Secondary | ICD-10-CM

## 2018-06-01 DIAGNOSIS — C3491 Malignant neoplasm of unspecified part of right bronchus or lung: Secondary | ICD-10-CM

## 2018-06-01 DIAGNOSIS — Z79899 Other long term (current) drug therapy: Secondary | ICD-10-CM

## 2018-06-01 DIAGNOSIS — M0579 Rheumatoid arthritis with rheumatoid factor of multiple sites without organ or systems involvement: Secondary | ICD-10-CM

## 2018-06-01 DIAGNOSIS — C3431 Malignant neoplasm of lower lobe, right bronchus or lung: Secondary | ICD-10-CM

## 2018-06-02 LAB — VITAMIN D 25 HYDROXY (VIT D DEFICIENCY, FRACTURES): Vit D, 25-Hydroxy: 42 ng/mL (ref 30–100)

## 2018-06-02 NOTE — Progress Notes (Signed)
Vitamin D is within desirable range.  Please advise patient to take a maintenance dose of vitamin D.

## 2018-06-14 ENCOUNTER — Inpatient Hospital Stay: Payer: BC Managed Care – PPO

## 2018-06-14 ENCOUNTER — Telehealth: Payer: Self-pay | Admitting: Internal Medicine

## 2018-06-14 ENCOUNTER — Encounter: Payer: Self-pay | Admitting: Internal Medicine

## 2018-06-14 ENCOUNTER — Inpatient Hospital Stay: Payer: BC Managed Care – PPO | Admitting: Internal Medicine

## 2018-06-14 VITALS — BP 126/77 | HR 100 | Temp 98.6°F | Resp 17 | Ht 60.0 in | Wt 122.8 lb

## 2018-06-14 DIAGNOSIS — C3431 Malignant neoplasm of lower lobe, right bronchus or lung: Secondary | ICD-10-CM | POA: Diagnosis not present

## 2018-06-14 DIAGNOSIS — R1310 Dysphagia, unspecified: Secondary | ICD-10-CM

## 2018-06-14 DIAGNOSIS — M069 Rheumatoid arthritis, unspecified: Secondary | ICD-10-CM | POA: Diagnosis not present

## 2018-06-14 DIAGNOSIS — Z5112 Encounter for antineoplastic immunotherapy: Secondary | ICD-10-CM

## 2018-06-14 DIAGNOSIS — C3491 Malignant neoplasm of unspecified part of right bronchus or lung: Secondary | ICD-10-CM

## 2018-06-14 LAB — CMP (CANCER CENTER ONLY)
ALT: 19 U/L (ref 0–44)
AST: 22 U/L (ref 15–41)
Albumin: 3.2 g/dL — ABNORMAL LOW (ref 3.5–5.0)
Alkaline Phosphatase: 100 U/L (ref 38–126)
Anion gap: 9 (ref 5–15)
BUN: 13 mg/dL (ref 8–23)
CO2: 24 mmol/L (ref 22–32)
Calcium: 9.6 mg/dL (ref 8.9–10.3)
Chloride: 106 mmol/L (ref 98–111)
Creatinine: 0.77 mg/dL (ref 0.44–1.00)
GFR, Est AFR Am: >60 mL/min (ref >60)
GFR, Estimated: >60 mL/min (ref >60)
Glucose, Bld: 86 mg/dL (ref 70–99)
Potassium: 4.2 mmol/L (ref 3.5–5.1)
Sodium: 139 mmol/L (ref 135–145)
Total Bilirubin: <0.2 mg/dL — ABNORMAL LOW (ref 0.3–1.2)
Total Protein: 7.5 g/dL (ref 6.5–8.1)

## 2018-06-14 LAB — CBC WITH DIFFERENTIAL (CANCER CENTER ONLY)
Abs Immature Granulocytes: 0.03 10*3/uL (ref 0.00–0.07)
Basophils Absolute: 0 10*3/uL (ref 0.0–0.1)
Basophils Relative: 0 %
Eosinophils Absolute: 0.1 10*3/uL (ref 0.0–0.5)
Eosinophils Relative: 1 %
HCT: 36.8 % (ref 36.0–46.0)
Hemoglobin: 11.8 g/dL — ABNORMAL LOW (ref 12.0–15.0)
Immature Granulocytes: 0 %
Lymphocytes Relative: 18 %
Lymphs Abs: 1.2 10*3/uL (ref 0.7–4.0)
MCH: 30.8 pg (ref 26.0–34.0)
MCHC: 32.1 g/dL (ref 30.0–36.0)
MCV: 96.1 fL (ref 80.0–100.0)
Monocytes Absolute: 0.9 10*3/uL (ref 0.1–1.0)
Monocytes Relative: 13 %
Neutro Abs: 4.5 10*3/uL (ref 1.7–7.7)
Neutrophils Relative %: 68 %
Platelet Count: 332 10*3/uL (ref 150–400)
RBC: 3.83 MIL/uL — ABNORMAL LOW (ref 3.87–5.11)
RDW: 13.2 % (ref 11.5–15.5)
WBC Count: 6.7 10*3/uL (ref 4.0–10.5)
nRBC: 0 % (ref 0.0–0.2)

## 2018-06-14 MED ORDER — SODIUM CHLORIDE 0.9 % IV SOLN
Freq: Once | INTRAVENOUS | Status: AC
  Administered 2018-06-14: 12:00:00 via INTRAVENOUS
  Filled 2018-06-14: qty 250

## 2018-06-14 MED ORDER — SODIUM CHLORIDE 0.9 % IV SOLN
500.0000 mg | Freq: Once | INTRAVENOUS | Status: AC
  Administered 2018-06-14: 500 mg via INTRAVENOUS
  Filled 2018-06-14: qty 10

## 2018-06-14 NOTE — Telephone Encounter (Signed)
Scheduled appt per 2/17 los. ° °Printed calendar and avs. °

## 2018-06-14 NOTE — Patient Instructions (Signed)
[facility] Discharge Instructions for Patients Receiving Chemotherapy  Today you received the following chemotherapy agents: Imfinzi.  To help prevent nausea and vomiting after your treatment, we encourage you to take your nausea medication as directed.   If you develop nausea and vomiting that is not controlled by your nausea medication, call the clinic.   BELOW ARE SYMPTOMS THAT SHOULD BE REPORTED IMMEDIATELY:  *FEVER GREATER THAN 100.5 F  *CHILLS WITH OR WITHOUT FEVER  NAUSEA AND VOMITING THAT IS NOT CONTROLLED WITH YOUR NAUSEA MEDICATION  *UNUSUAL SHORTNESS OF BREATH  *UNUSUAL BRUISING OR BLEEDING  TENDERNESS IN MOUTH AND THROAT WITH OR WITHOUT PRESENCE OF ULCERS  *URINARY PROBLEMS  *BOWEL PROBLEMS  UNUSUAL RASH Items with * indicate a potential emergency and should be followed up as soon as possible.  Feel free to call the clinic should you have any questions or concerns. The clinic phone number is [phone].  Please show the CHEMO ALERT CARD at check-in to the Emergency Department and triage nurse.   

## 2018-06-14 NOTE — Progress Notes (Signed)
[place] Telephone:([phone]   Fax:(336) [phone]  OFFICE PROGRESS NOTE  Default, Provider, MD No address on file  DIAGNOSIS: Stage IIIA (T3, N1/N2, M0) non-small cell lung cancer, adenocarcinoma presented with large right lower lobe lung mass with extension to the right hilum and subcarinal area diagnosed in July 2019.  Biomarker Findings Tumor Mutational Burden - TMB-Intermediate (6 Muts/Mb) Microsatellite status - MS-Stable Genomic Findings For a complete list of the genes assayed, please refer to the Appendix. NRAS Q61R ARAF amplification STK11 G56W KRAS G13D MYCN amplification MCL1 amplification NKX2-1 amplification - equivocal? TP53 G245V 7 Disease relevant genes with no reportable alterations: EGFR, ALK, BRAF, MET, ERBB2, RET, ROS1   PRIOR THERAPY: Course of concurrent chemoradiation with weekly carboplatin for AUC of 2 and paclitaxel 45 mg/M2.  Status post 7 cycles.  Last dose was giving 01/11/2018.  CURRENT THERAPY: Consolidation treatment with immunotherapy with Imfinzi (Durvalumab) 10 mg/KG every 2 weeks.  First dose February 09, 2018.  Status post 9 cycles.  INTERVAL HISTORY: Susan Holder 62 y.o. female returns to the clinic today for follow-up visit.  The patient is feeling fine with no concerning complaints except for mild arthralgia in her hands.  She takes Aleve on as-needed basis.  She denied having any chest pain, shortness of breath, cough or hemoptysis.  She denied having any fever or chills.  She has no nausea, vomiting, diarrhea or constipation.  She denied having any headache or visual changes.  She is here today for evaluation before starting cycle #10.   MEDICAL HISTORY: Past Medical History:  Diagnosis Date  . Rheumatoid arthritis ([place])     ALLERGIES:  has No Known Allergies.  MEDICATIONS:  Current Outpatient Medications  Medication Sig Dispense Refill  . aspirin EC 81 MG tablet Take 81 mg by mouth daily.    [name] (IMFINZI IV) Inject into the vein every 14 (fourteen) days.    . famotidine (PEPCID) 40 MG tablet Take 40 mg by mouth as needed.     . hydroxychloroquine (PLAQUENIL) 200 MG tablet TAKE 1 TABLET BY MOUTH TWICE DAILY MONDAY THROUGH FRIDAY 40 tablet 2  . naproxen sodium (ALEVE) 220 MG tablet Take 220 mg by mouth daily as needed (PAIN).    Marland Kitchen Phenylephrine-DM-GG-APAP (DELSYM COUGH/COLD DAYTIME PO) Take by mouth as needed.     . pseudoephedrine-guaifenesin (MUCINEX D) 60-600 MG 12 hr tablet Take 1 tablet by mouth as needed.     Marland Kitchen VITAMIN D PO Take by mouth.     No current facility-administered medications for this visit.     SURGICAL HISTORY:  Past Surgical History:  Procedure Laterality Date  . BACK SURGERY    . BRONCHIAL NEEDLE ASPIRATION BIOPSY  11/09/2017   Procedure: BRONCHIAL NEEDLE ASPIRATION BIOPSIES;  Surgeon: Marshell Garfinkel, MD;  Location: [facility];  Service: Cardiopulmonary;;  . ENDOBRONCHIAL ULTRASOUND Bilateral 11/09/2017   Procedure: ENDOBRONCHIAL ULTRASOUND;  Surgeon: Marshell Garfinkel, MD;  Location: [facility];  Service: Cardiopulmonary;  Laterality: Bilateral;  . NASAL SINUS SURGERY    . NASAL SINUS SURGERY    . TUBAL LIGATION    . VIDEO BRONCHOSCOPY  11/09/2017   Procedure: VIDEO BRONCHOSCOPY;  Surgeon: Marshell Garfinkel, MD;  [facility];  Service: Cardiopulmonary;;    REVIEW OF SYSTEMS:  A comprehensive review of systems was negative except for: Musculoskeletal: positive for arthralgias   PHYSICAL EXAMINATION: General appearance: alert, cooperative and no distress Head: Normocephalic, without obvious abnormality, atraumatic Neck: no  adenopathy, no JVD, supple, symmetrical, trachea midline and thyroid not enlarged, symmetric, no tenderness/mass/nodules Lymph nodes: Cervical, supraclavicular, and axillary nodes normal. Resp: clear to auscultation bilaterally Back: symmetric, no curvature. ROM normal. No CVA tenderness. Cardio: regular rate and  rhythm, S1, S2 normal, no murmur, click, rub or gallop GI: soft, non-tender; bowel sounds normal; no masses,  no organomegaly Extremities: extremities normal, atraumatic, no cyanosis or edema  ECOG PERFORMANCE STATUS: 1 - Symptomatic but completely ambulatory  Blood pressure 126/77, pulse 100, temperature 98.6 F (37 C), temperature source Oral, resp. rate 17, height 5' (1.524 m), weight 122 lb 12.8 oz (55.7 kg), last menstrual period 04/28/2000, SpO2 96 %.  LABORATORY DATA: Lab Results  Component Value Date   WBC 6.7 06/14/2018   HGB 11.8 (L) 06/14/2018   HCT 36.8 06/14/2018   MCV 96.1 06/14/2018   PLT 332 06/14/2018      Chemistry      Component Value Date/Time   NA 137 05/31/2018 0934   K 4.7 05/31/2018 0934   CL 105 05/31/2018 0934   CO2 25 05/31/2018 0934   BUN 19 05/31/2018 0934   CREATININE 0.79 05/31/2018 0934   CREATININE 0.80 07/24/2017 1438      Component Value Date/Time   CALCIUM 10.0 05/31/2018 0934   ALKPHOS 136 (H) 05/31/2018 0934   AST 84 (H) 05/31/2018 0934   ALT 82 (H) 05/31/2018 0934   BILITOT <0.2 (L) 05/31/2018 0934       RADIOGRAPHIC STUDIES: No results found.  ASSESSMENT AND PLAN: This is a very pleasant 63 years old African-American female recently diagnosed with a stage IIIA non-small cell lung cancer, adenocarcinoma.  She underwent a course of concurrent chemoradiation with weekly carboplatin and paclitaxel status post 7 cycles with partial response.   The patient tolerated this course of treatment well except for mild odynophagia and dysphagia. She is currently on consolidation treatment with immunotherapy with Imfinzi (Durvalumab) status post 9 cycles. She continues to tolerate this treatment well with no concerning adverse effects except for the arthralgia. I recommended for her to proceed with cycle #10 today as scheduled. I will see her back for follow-up visit in 2 weeks for evaluation before starting cycle #11. The patient was advised  to call immediately if she has any concerning symptoms in the interval. The patient voices understanding of current disease status and treatment options and is in agreement with the current care plan. All questions were answered. The patient knows to call the clinic with any problems, questions or concerns. We can certainly see the patient much sooner if necessary.  Disclaimer: This note was dictated with voice recognition software. Similar sounding words can inadvertently be transcribed and may not be corrected upon review.

## 2018-06-28 ENCOUNTER — Inpatient Hospital Stay: Payer: BC Managed Care – PPO | Attending: Internal Medicine

## 2018-06-28 ENCOUNTER — Inpatient Hospital Stay: Payer: BC Managed Care – PPO | Admitting: Internal Medicine

## 2018-06-28 ENCOUNTER — Encounter: Payer: Self-pay | Admitting: Internal Medicine

## 2018-06-28 ENCOUNTER — Inpatient Hospital Stay: Payer: BC Managed Care – PPO

## 2018-06-28 ENCOUNTER — Telehealth: Payer: Self-pay | Admitting: Internal Medicine

## 2018-06-28 VITALS — HR 105

## 2018-06-28 VITALS — BP 147/78 | HR 118 | Temp 98.3°F | Resp 18 | Ht 60.0 in | Wt 121.4 lb

## 2018-06-28 DIAGNOSIS — R1310 Dysphagia, unspecified: Secondary | ICD-10-CM

## 2018-06-28 DIAGNOSIS — M069 Rheumatoid arthritis, unspecified: Secondary | ICD-10-CM | POA: Diagnosis not present

## 2018-06-28 DIAGNOSIS — Z79899 Other long term (current) drug therapy: Secondary | ICD-10-CM | POA: Diagnosis not present

## 2018-06-28 DIAGNOSIS — C3491 Malignant neoplasm of unspecified part of right bronchus or lung: Secondary | ICD-10-CM

## 2018-06-28 DIAGNOSIS — Z5112 Encounter for antineoplastic immunotherapy: Secondary | ICD-10-CM | POA: Insufficient documentation

## 2018-06-28 DIAGNOSIS — C3431 Malignant neoplasm of lower lobe, right bronchus or lung: Secondary | ICD-10-CM | POA: Diagnosis not present

## 2018-06-28 LAB — CBC WITH DIFFERENTIAL (CANCER CENTER ONLY)
Abs Immature Granulocytes: 0.02 10*3/uL (ref 0.00–0.07)
Basophils Absolute: 0 10*3/uL (ref 0.0–0.1)
Basophils Relative: 0 %
Eosinophils Absolute: 0.1 10*3/uL (ref 0.0–0.5)
Eosinophils Relative: 1 %
HCT: 35.9 % — ABNORMAL LOW (ref 36.0–46.0)
Hemoglobin: 11.3 g/dL — ABNORMAL LOW (ref 12.0–15.0)
Immature Granulocytes: 0 %
Lymphocytes Relative: 14 %
Lymphs Abs: 1.1 10*3/uL (ref 0.7–4.0)
MCH: 30.6 pg (ref 26.0–34.0)
MCHC: 31.5 g/dL (ref 30.0–36.0)
MCV: 97.3 fL (ref 80.0–100.0)
Monocytes Absolute: 0.9 10*3/uL (ref 0.1–1.0)
Monocytes Relative: 12 %
Neutro Abs: 5.8 10*3/uL (ref 1.7–7.7)
Neutrophils Relative %: 73 %
Platelet Count: 396 10*3/uL (ref 150–400)
RBC: 3.69 MIL/uL — ABNORMAL LOW (ref 3.87–5.11)
RDW: 12.9 % (ref 11.5–15.5)
WBC Count: 7.9 10*3/uL (ref 4.0–10.5)
nRBC: 0 % (ref 0.0–0.2)

## 2018-06-28 LAB — CMP (CANCER CENTER ONLY)
ALT: 25 U/L (ref 0–44)
AST: 26 U/L (ref 15–41)
Albumin: 3.2 g/dL — ABNORMAL LOW (ref 3.5–5.0)
Alkaline Phosphatase: 112 U/L (ref 38–126)
Anion gap: 13 (ref 5–15)
BUN: 15 mg/dL (ref 8–23)
CO2: 24 mmol/L (ref 22–32)
Calcium: 10.2 mg/dL (ref 8.9–10.3)
Chloride: 103 mmol/L (ref 98–111)
Creatinine: 0.84 mg/dL (ref 0.44–1.00)
GFR, Est AFR Am: >60 mL/min (ref >60)
GFR, Estimated: >60 mL/min (ref >60)
Glucose, Bld: 87 mg/dL (ref 70–99)
Potassium: 4.6 mmol/L (ref 3.5–5.1)
Sodium: 140 mmol/L (ref 135–145)
Total Bilirubin: 0.2 mg/dL — ABNORMAL LOW (ref 0.3–1.2)
Total Protein: 7.9 g/dL (ref 6.5–8.1)

## 2018-06-28 LAB — TSH: TSH: 2.025 u[IU]/mL (ref 0.308–3.960)

## 2018-06-28 MED ORDER — SODIUM CHLORIDE 0.9 % IV SOLN
Freq: Once | INTRAVENOUS | Status: AC
  Administered 2018-06-28: 10:00:00 via INTRAVENOUS
  Filled 2018-06-28: qty 250

## 2018-06-28 MED ORDER — SODIUM CHLORIDE 0.9 % IV SOLN
9.5000 mg/kg | Freq: Once | INTRAVENOUS | Status: AC
  Administered 2018-06-28: 500 mg via INTRAVENOUS
  Filled 2018-06-28: qty 10

## 2018-06-28 NOTE — Progress Notes (Signed)
Okay to treat-Per [facility] it is okay to treat pt today with Imfinzi and pulse of 105.

## 2018-06-28 NOTE — Telephone Encounter (Signed)
Scheduled appt per 03/02 los.  Printed calendar and avs.

## 2018-06-28 NOTE — Patient Instructions (Signed)
[facility] Discharge Instructions for Patients Receiving Chemotherapy  Today you received the following chemotherapy agents: Imfinzi.  To help prevent nausea and vomiting after your treatment, we encourage you to take your nausea medication as directed.   If you develop nausea and vomiting that is not controlled by your nausea medication, call the clinic.   BELOW ARE SYMPTOMS THAT SHOULD BE REPORTED IMMEDIATELY:  *FEVER GREATER THAN 100.5 F  *CHILLS WITH OR WITHOUT FEVER  NAUSEA AND VOMITING THAT IS NOT CONTROLLED WITH YOUR NAUSEA MEDICATION  *UNUSUAL SHORTNESS OF BREATH  *UNUSUAL BRUISING OR BLEEDING  TENDERNESS IN MOUTH AND THROAT WITH OR WITHOUT PRESENCE OF ULCERS  *URINARY PROBLEMS  *BOWEL PROBLEMS  UNUSUAL RASH Items with * indicate a potential emergency and should be followed up as soon as possible.  Feel free to call the clinic should you have any questions or concerns. The clinic phone number is [phone].  Please show the CHEMO ALERT CARD at check-in to the Emergency Department and triage nurse.   

## 2018-06-28 NOTE — Progress Notes (Signed)
[place] [phone]   Fax:(336) [phone]  OFFICE PROGRESS NOTE  Default, Provider, MD No address on file  DIAGNOSIS: Stage IIIA (T3, N1/N2, M0) non-small cell lung cancer, adenocarcinoma presented with large right lower lobe lung mass with extension to the right hilum and subcarinal area diagnosed in July 2019.  Biomarker Findings Tumor Mutational Burden - TMB-Intermediate (6 Muts/Mb) Microsatellite status - MS-Stable Genomic Findings For a complete list of the genes assayed, please refer to the Appendix. NRAS Q61R ARAF amplification STK11 G56W KRAS G13D MYCN amplification MCL1 amplification NKX2-1 amplification - equivocal? TP53 G245V 7 Disease relevant genes with no reportable alterations: EGFR, ALK, BRAF, MET, ERBB2, RET, ROS1   PRIOR THERAPY: Course of concurrent chemoradiation with weekly carboplatin for AUC of 2 and paclitaxel 45 mg/M2.  Status post 7 cycles.  Last dose was giving 01/11/2018.  CURRENT THERAPY: Consolidation treatment with immunotherapy with Imfinzi (Durvalumab) 10 mg/KG every 2 weeks.  First dose February 09, 2018.  Status post 10 cycles.  INTERVAL HISTORY: Susan Holder 63 y.o. female returns to the clinic today for follow-up visit.  The patient is feeling fine today with no concerning complaints except for arthralgia of her knees.  She denied having any chest pain, shortness of breath but continues to have cough with no hemoptysis.  She denied having any fever or chills.  She has no nausea, vomiting, diarrhea or constipation.  She has no headache or visual changes.  She continues to tolerate her treatment with Imfinzi fairly well.  The patient is here today for evaluation before starting cycle #11.  MEDICAL HISTORY: Past Medical History:  Diagnosis Date  . Rheumatoid arthritis ([place])     ALLERGIES:  has No Known Allergies.  MEDICATIONS:  Current Outpatient Medications  Medication Sig Dispense Refill  . aspirin  EC 81 MG tablet Take 81 mg by mouth daily.    [name] (IMFINZI IV) Inject into the vein every 14 (fourteen) days.    . famotidine (PEPCID) 40 MG tablet Take 40 mg by mouth as needed.     . hydroxychloroquine (PLAQUENIL) 200 MG tablet TAKE 1 TABLET BY MOUTH TWICE DAILY MONDAY THROUGH FRIDAY 40 tablet 2  . naproxen sodium (ALEVE) 220 MG tablet Take 220 mg by mouth daily as needed (PAIN).    Marland Kitchen Phenylephrine-DM-GG-APAP (DELSYM COUGH/COLD DAYTIME PO) Take by mouth as needed.     . pseudoephedrine-guaifenesin (MUCINEX D) 60-600 MG 12 hr tablet Take 1 tablet by mouth as needed.     Marland Kitchen VITAMIN D PO Take by mouth.     No current facility-administered medications for this visit.     SURGICAL HISTORY:  Past Surgical History:  Procedure Laterality Date  . BACK SURGERY    . BRONCHIAL NEEDLE ASPIRATION BIOPSY  11/09/2017   Procedure: BRONCHIAL NEEDLE ASPIRATION BIOPSIES;  Surgeon: Marshell Garfinkel, MD;  [facility];  Service: Cardiopulmonary;;  . ENDOBRONCHIAL ULTRASOUND Bilateral 11/09/2017   Procedure: ENDOBRONCHIAL ULTRASOUND;  Surgeon: Marshell Garfinkel, MD;  Location: [facility];  Service: Cardiopulmonary;  Laterality: Bilateral;  . NASAL SINUS SURGERY    . NASAL SINUS SURGERY    . TUBAL LIGATION    . VIDEO BRONCHOSCOPY  11/09/2017   Procedure: VIDEO BRONCHOSCOPY;  Surgeon: Marshell Garfinkel, MD;  [facility];  Service: Cardiopulmonary;;    REVIEW OF SYSTEMS:  A comprehensive review of systems was negative except for: Musculoskeletal: positive for arthralgias   PHYSICAL EXAMINATION: General appearance: alert, cooperative and no distress  Head: Normocephalic, without obvious abnormality, atraumatic Neck: no adenopathy, no JVD, supple, symmetrical, trachea midline and thyroid not enlarged, symmetric, no tenderness/mass/nodules Lymph nodes: Cervical, supraclavicular, and axillary nodes normal. Resp: clear to auscultation bilaterally Back: symmetric, no curvature. ROM normal.  No CVA tenderness. Cardio: regular rate and rhythm, S1, S2 normal, no murmur, click, rub or gallop GI: soft, non-tender; bowel sounds normal; no masses,  no organomegaly Extremities: extremities normal, atraumatic, no cyanosis or edema  ECOG PERFORMANCE STATUS: 1 - Symptomatic but completely ambulatory  Blood pressure (!) 147/78, pulse (!) 118, temperature 98.3 F (36.8 C), temperature source Oral, resp. rate 18, height 5' (1.524 m), weight 121 lb 6.4 oz (55.1 kg), last menstrual period 04/28/2000, SpO2 96 %.  LABORATORY DATA: Lab Results  Component Value Date   WBC 7.9 06/28/2018   HGB 11.3 (L) 06/28/2018   HCT 35.9 (L) 06/28/2018   MCV 97.3 06/28/2018   PLT 396 06/28/2018      Chemistry      Component Value Date/Time   NA 139 06/14/2018 1006   K 4.2 06/14/2018 1006   CL 106 06/14/2018 1006   CO2 24 06/14/2018 1006   BUN 13 06/14/2018 1006   CREATININE 0.77 06/14/2018 1006   CREATININE 0.80 07/24/2017 1438      Component Value Date/Time   CALCIUM 9.6 06/14/2018 1006   ALKPHOS 100 06/14/2018 1006   AST 22 06/14/2018 1006   ALT 19 06/14/2018 1006   BILITOT <0.2 (L) 06/14/2018 1006       RADIOGRAPHIC STUDIES: No results found.  ASSESSMENT AND PLAN: This is a very pleasant 63 years old African-American female recently diagnosed with a stage IIIA non-small cell lung cancer, adenocarcinoma.  She underwent a course of concurrent chemoradiation with weekly carboplatin and paclitaxel status post 7 cycles with partial response.   The patient tolerated this course of treatment well except for mild odynophagia and dysphagia. She is currently on consolidation treatment with immunotherapy with Imfinzi (Durvalumab) status post 10 cycles. She has been tolerating this treatment well with no concerning adverse effects. I recommended for the patient to proceed with cycle #11 today as scheduled. I will see her back for follow-up visit in 2 weeks for evaluation before starting cycle  #12. For arthralgia she is currently on Aleve twice daily.  I recommended for the patient to take Aleve only if needed. She was advised to call immediately if she has any concerning symptoms in the interval. The patient voices understanding of current disease status and treatment options and is in agreement with the current care plan. All questions were answered. The patient knows to call the clinic with any problems, questions or concerns. We can certainly see the patient much sooner if necessary.  Disclaimer: This note was dictated with voice recognition software. Similar sounding words can inadvertently be transcribed and may not be corrected upon review.

## 2018-07-12 ENCOUNTER — Other Ambulatory Visit: Payer: Self-pay

## 2018-07-12 ENCOUNTER — Inpatient Hospital Stay: Payer: BC Managed Care – PPO

## 2018-07-12 ENCOUNTER — Encounter: Payer: Self-pay | Admitting: Internal Medicine

## 2018-07-12 ENCOUNTER — Inpatient Hospital Stay: Payer: BC Managed Care – PPO | Admitting: Internal Medicine

## 2018-07-12 VITALS — BP 133/80 | HR 101 | Temp 98.4°F | Resp 18 | Ht 61.0 in | Wt 120.7 lb

## 2018-07-12 DIAGNOSIS — R1310 Dysphagia, unspecified: Secondary | ICD-10-CM

## 2018-07-12 DIAGNOSIS — Z5112 Encounter for antineoplastic immunotherapy: Secondary | ICD-10-CM | POA: Diagnosis not present

## 2018-07-12 DIAGNOSIS — C3431 Malignant neoplasm of lower lobe, right bronchus or lung: Secondary | ICD-10-CM

## 2018-07-12 DIAGNOSIS — M069 Rheumatoid arthritis, unspecified: Secondary | ICD-10-CM | POA: Diagnosis not present

## 2018-07-12 DIAGNOSIS — C3491 Malignant neoplasm of unspecified part of right bronchus or lung: Secondary | ICD-10-CM

## 2018-07-12 DIAGNOSIS — C3490 Malignant neoplasm of unspecified part of unspecified bronchus or lung: Secondary | ICD-10-CM

## 2018-07-12 LAB — CBC WITH DIFFERENTIAL (CANCER CENTER ONLY)
Abs Immature Granulocytes: 0.02 10*3/uL (ref 0.00–0.07)
Basophils Absolute: 0 10*3/uL (ref 0.0–0.1)
Basophils Relative: 0 %
Eosinophils Absolute: 0.1 10*3/uL (ref 0.0–0.5)
Eosinophils Relative: 1 %
HCT: 35 % — ABNORMAL LOW (ref 36.0–46.0)
Hemoglobin: 11.2 g/dL — ABNORMAL LOW (ref 12.0–15.0)
Immature Granulocytes: 0 %
Lymphocytes Relative: 19 %
Lymphs Abs: 1.3 10*3/uL (ref 0.7–4.0)
MCH: 30.8 pg (ref 26.0–34.0)
MCHC: 32 g/dL (ref 30.0–36.0)
MCV: 96.2 fL (ref 80.0–100.0)
Monocytes Absolute: 0.9 10*3/uL (ref 0.1–1.0)
Monocytes Relative: 13 %
Neutro Abs: 4.7 10*3/uL (ref 1.7–7.7)
Neutrophils Relative %: 67 %
Platelet Count: 368 10*3/uL (ref 150–400)
RBC: 3.64 MIL/uL — ABNORMAL LOW (ref 3.87–5.11)
RDW: 12.8 % (ref 11.5–15.5)
WBC Count: 7 10*3/uL (ref 4.0–10.5)
nRBC: 0 % (ref 0.0–0.2)

## 2018-07-12 LAB — CMP (CANCER CENTER ONLY)
ALT: 19 U/L (ref 0–44)
AST: 29 U/L (ref 15–41)
Albumin: 3.1 g/dL — ABNORMAL LOW (ref 3.5–5.0)
Alkaline Phosphatase: 108 U/L (ref 38–126)
Anion gap: 11 (ref 5–15)
BUN: 15 mg/dL (ref 8–23)
CO2: 23 mmol/L (ref 22–32)
Calcium: 9.8 mg/dL (ref 8.9–10.3)
Chloride: 104 mmol/L (ref 98–111)
Creatinine: 0.81 mg/dL (ref 0.44–1.00)
GFR, Est AFR Am: >60 mL/min (ref >60)
GFR, Estimated: >60 mL/min (ref >60)
Glucose, Bld: 92 mg/dL (ref 70–99)
Potassium: 4.3 mmol/L (ref 3.5–5.1)
Sodium: 138 mmol/L (ref 135–145)
Total Bilirubin: <0.2 mg/dL — ABNORMAL LOW (ref 0.3–1.2)
Total Protein: 7.6 g/dL (ref 6.5–8.1)

## 2018-07-12 MED ORDER — SODIUM CHLORIDE 0.9 % IV SOLN
9.6000 mg/kg | Freq: Once | INTRAVENOUS | Status: AC
  Administered 2018-07-12: 500 mg via INTRAVENOUS
  Filled 2018-07-12: qty 10

## 2018-07-12 MED ORDER — SODIUM CHLORIDE 0.9 % IV SOLN
Freq: Once | INTRAVENOUS | Status: AC
  Administered 2018-07-12: 11:00:00 via INTRAVENOUS
  Filled 2018-07-12: qty 250

## 2018-07-12 NOTE — Patient Instructions (Signed)
[facility] Discharge Instructions for Patients Receiving Chemotherapy  Today you received the following chemotherapy agents: Imfinzi.  To help prevent nausea and vomiting after your treatment, we encourage you to take your nausea medication as directed.   If you develop nausea and vomiting that is not controlled by your nausea medication, call the clinic.   BELOW ARE SYMPTOMS THAT SHOULD BE REPORTED IMMEDIATELY:  *FEVER GREATER THAN 100.5 F  *CHILLS WITH OR WITHOUT FEVER  NAUSEA AND VOMITING THAT IS NOT CONTROLLED WITH YOUR NAUSEA MEDICATION  *UNUSUAL SHORTNESS OF BREATH  *UNUSUAL BRUISING OR BLEEDING  TENDERNESS IN MOUTH AND THROAT WITH OR WITHOUT PRESENCE OF ULCERS  *URINARY PROBLEMS  *BOWEL PROBLEMS  UNUSUAL RASH Items with * indicate a potential emergency and should be followed up as soon as possible.  Feel free to call the clinic should you have any questions or concerns. The clinic phone number is [phone].  Please show the CHEMO ALERT CARD at check-in to the Emergency Department and triage nurse.   

## 2018-07-12 NOTE — Progress Notes (Signed)
[place] [phone]   [phone]  OFFICE PROGRESS NOTE  Default, Provider, MD No address on file  DIAGNOSIS: Stage IIIA (T3, N1/N2, M0) non-small cell lung cancer, adenocarcinoma presented with large right lower lobe lung mass with extension to the right hilum and subcarinal area diagnosed in July 2019.  Biomarker Findings Tumor Mutational Burden - TMB-Intermediate (6 Muts/Mb) Microsatellite status - MS-Stable Genomic Findings For a complete list of the genes assayed, please refer to the Appendix. NRAS Q61R ARAF amplification STK11 G56W KRAS G13D MYCN amplification MCL1 amplification NKX2-1 amplification - equivocal? TP53 G245V 7 Disease relevant genes with no reportable alterations: EGFR, ALK, BRAF, MET, ERBB2, RET, ROS1   PRIOR THERAPY: Course of concurrent chemoradiation with weekly carboplatin for AUC of 2 and paclitaxel 45 mg/M2.  Status post 7 cycles.  Last dose was giving 01/11/2018.  CURRENT THERAPY: Consolidation treatment with immunotherapy with Imfinzi (Durvalumab) 10 mg/KG every 2 weeks.  First dose February 09, 2018.  Status post 11 cycles.  INTERVAL HISTORY: Susan Holder 63 y.o. female returns to the clinic today for follow-up visit.  The patient is feeling fine today with no concerning complaints except for arthralgia.  She denied having any chest pain, shortness of breath, cough or hemoptysis.  She denied having any fever or chills.  She has no nausea, vomiting, diarrhea or constipation.  She denied having any headache or visual changes.  The patient is here today for evaluation before starting cycle #12.  MEDICAL HISTORY: Past Medical History:  Diagnosis Date  . Rheumatoid arthritis ([place])     ALLERGIES:  has No Known Allergies.  MEDICATIONS:  Current Outpatient Medications  Medication Sig Dispense Refill  . aspirin EC 81 MG tablet Take 81 mg by mouth daily.    [name] (IMFINZI IV) Inject into the vein  every 14 (fourteen) days.    . famotidine (PEPCID) 40 MG tablet Take 40 mg by mouth as needed.     . hydroxychloroquine (PLAQUENIL) 200 MG tablet TAKE 1 TABLET BY MOUTH TWICE DAILY MONDAY THROUGH FRIDAY 40 tablet 2  . naproxen sodium (ALEVE) 220 MG tablet Take 220 mg by mouth daily as needed (PAIN).    Marland Kitchen Phenylephrine-DM-GG-APAP (DELSYM COUGH/COLD DAYTIME PO) Take by mouth as needed.     . pseudoephedrine-guaifenesin (MUCINEX D) 60-600 MG 12 hr tablet Take 1 tablet by mouth as needed.     Marland Kitchen VITAMIN D PO Take by mouth.     No current facility-administered medications for this visit.     SURGICAL HISTORY:  Past Surgical History:  Procedure Laterality Date  . BACK SURGERY    . BRONCHIAL NEEDLE ASPIRATION BIOPSY  11/09/2017   Procedure: BRONCHIAL NEEDLE ASPIRATION BIOPSIES;  Surgeon: Marshell Garfinkel, MD;  Location: [facility];  Service: Cardiopulmonary;;  . ENDOBRONCHIAL ULTRASOUND Bilateral 11/09/2017   Procedure: ENDOBRONCHIAL ULTRASOUND;  Surgeon: Marshell Garfinkel, MD;  Location: [facility];  Service: Cardiopulmonary;  Laterality: Bilateral;  . NASAL SINUS SURGERY    . NASAL SINUS SURGERY    . TUBAL LIGATION    . VIDEO BRONCHOSCOPY  11/09/2017   Procedure: VIDEO BRONCHOSCOPY;  Surgeon: Marshell Garfinkel, MD;  Location: [facility];  Service: Cardiopulmonary;;    REVIEW OF SYSTEMS:  A comprehensive review of systems was negative except for: Musculoskeletal: positive for arthralgias   PHYSICAL EXAMINATION: General appearance: alert, cooperative and no distress Head: Normocephalic, without obvious abnormality, atraumatic Neck: no adenopathy, no JVD, supple, symmetrical, trachea midline and thyroid not  enlarged, symmetric, no tenderness/mass/nodules Lymph nodes: Cervical, supraclavicular, and axillary nodes normal. Resp: clear to auscultation bilaterally Back: symmetric, no curvature. ROM normal. No CVA tenderness. Cardio: regular rate and rhythm, S1, S2 normal, no murmur, click, rub or  gallop GI: soft, non-tender; bowel sounds normal; no masses,  no organomegaly Extremities: extremities normal, atraumatic, no cyanosis or edema  ECOG PERFORMANCE STATUS: 1 - Symptomatic but completely ambulatory  Blood pressure 133/80, pulse (!) 101, temperature 98.4 F (36.9 C), temperature source Oral, resp. rate 18, height 5' 1"  (1.549 m), weight 120 lb 11.2 oz (54.7 kg), last menstrual period 04/28/2000, SpO2 100 %.  LABORATORY DATA: Lab Results  Component Value Date   WBC 7.0 07/12/2018   HGB 11.2 (L) 07/12/2018   HCT 35.0 (L) 07/12/2018   MCV 96.2 07/12/2018   PLT 368 07/12/2018      Chemistry      Component Value Date/Time   NA 140 06/28/2018 0802   K 4.6 06/28/2018 0802   CL 103 06/28/2018 0802   CO2 24 06/28/2018 0802   BUN 15 06/28/2018 0802   CREATININE 0.84 06/28/2018 0802   CREATININE 0.80 07/24/2017 1438      Component Value Date/Time   CALCIUM 10.2 06/28/2018 0802   ALKPHOS 112 06/28/2018 0802   AST 26 06/28/2018 0802   ALT 25 06/28/2018 0802   BILITOT 0.2 (L) 06/28/2018 0802       RADIOGRAPHIC STUDIES: No results found.  ASSESSMENT AND PLAN: This is a very pleasant 63 years old African-American female recently diagnosed with a stage IIIA non-small cell lung cancer, adenocarcinoma.  She underwent a course of concurrent chemoradiation with weekly carboplatin and paclitaxel status post 7 cycles with partial response.   The patient tolerated this course of treatment well except for mild odynophagia and dysphagia. She is currently on consolidation treatment with immunotherapy with Imfinzi (Durvalumab) status post 11 cycles. The patient continues to tolerate this treatment well with no concerning complaints except for the arthralgia.  I recommended for her to proceed with cycle #12 today as scheduled. I will see the patient back for follow-up visit in 2 weeks for evaluation after repeating CT scan of the chest for restaging of her disease. For arthralgia she  is currently on Aleve twice daily.  I recommended for the patient to take Aleve only if needed. The patient was advised to call immediately if she has any concerning symptoms in the interval. The patient voices understanding of current disease status and treatment options and is in agreement with the current care plan. All questions were answered. The patient knows to call the clinic with any problems, questions or concerns. We can certainly see the patient much sooner if necessary.  Disclaimer: This note was dictated with voice recognition software. Similar sounding words can inadvertently be transcribed and may not be corrected upon review.

## 2018-07-14 ENCOUNTER — Telehealth: Payer: Self-pay | Admitting: Internal Medicine

## 2018-07-14 NOTE — Telephone Encounter (Signed)
Patient already on schedule as requested per 3/16 los. Central radiology will call re scan.

## 2018-07-21 ENCOUNTER — Ambulatory Visit: Payer: BC Managed Care – PPO | Admitting: Physician Assistant

## 2018-07-23 ENCOUNTER — Ambulatory Visit (HOSPITAL_COMMUNITY)
Admission: RE | Admit: 2018-07-23 | Discharge: 2018-07-23 | Disposition: A | Discharge status: Home / Self Care | Payer: BC Managed Care – PPO | Source: Ambulatory Visit | Attending: Internal Medicine | Admitting: Internal Medicine

## 2018-07-23 ENCOUNTER — Other Ambulatory Visit: Payer: Self-pay

## 2018-07-23 DIAGNOSIS — C3490 Malignant neoplasm of unspecified part of unspecified bronchus or lung: Secondary | ICD-10-CM | POA: Insufficient documentation

## 2018-07-23 MED ORDER — SODIUM CHLORIDE (PF) 0.9 % IJ SOLN
INTRAMUSCULAR | Status: AC
  Filled 2018-07-23: qty 50

## 2018-07-23 MED ORDER — IOHEXOL 300 MG/ML  SOLN
75.0000 mL | Freq: Once | INTRAMUSCULAR | Status: AC | PRN
  Administered 2018-07-23: 75 mL via INTRAVENOUS

## 2018-07-26 ENCOUNTER — Inpatient Hospital Stay: Payer: BC Managed Care – PPO

## 2018-07-26 ENCOUNTER — Inpatient Hospital Stay: Payer: BC Managed Care – PPO | Admitting: Internal Medicine

## 2018-07-26 ENCOUNTER — Encounter: Payer: Self-pay | Admitting: Internal Medicine

## 2018-07-26 ENCOUNTER — Other Ambulatory Visit: Payer: Self-pay

## 2018-07-26 VITALS — BP 146/74 | HR 92 | Temp 98.3°F | Resp 20 | Ht 61.0 in | Wt 123.0 lb

## 2018-07-26 DIAGNOSIS — M069 Rheumatoid arthritis, unspecified: Secondary | ICD-10-CM

## 2018-07-26 DIAGNOSIS — Z5112 Encounter for antineoplastic immunotherapy: Secondary | ICD-10-CM

## 2018-07-26 DIAGNOSIS — R1310 Dysphagia, unspecified: Secondary | ICD-10-CM | POA: Diagnosis not present

## 2018-07-26 DIAGNOSIS — C3431 Malignant neoplasm of lower lobe, right bronchus or lung: Secondary | ICD-10-CM

## 2018-07-26 DIAGNOSIS — C3491 Malignant neoplasm of unspecified part of right bronchus or lung: Secondary | ICD-10-CM

## 2018-07-26 LAB — CMP (CANCER CENTER ONLY)
ALT: 30 U/L (ref 0–44)
AST: 31 U/L (ref 15–41)
Albumin: 3.2 g/dL — ABNORMAL LOW (ref 3.5–5.0)
Alkaline Phosphatase: 119 U/L (ref 38–126)
Anion gap: 10 (ref 5–15)
BUN: 14 mg/dL (ref 8–23)
CO2: 25 mmol/L (ref 22–32)
Calcium: 9.9 mg/dL (ref 8.9–10.3)
Chloride: 103 mmol/L (ref 98–111)
Creatinine: 0.77 mg/dL (ref 0.44–1.00)
GFR, Est AFR Am: >60 mL/min (ref >60)
GFR, Estimated: >60 mL/min (ref >60)
Glucose, Bld: 92 mg/dL (ref 70–99)
Potassium: 4.2 mmol/L (ref 3.5–5.1)
Sodium: 138 mmol/L (ref 135–145)
Total Bilirubin: <0.2 mg/dL — ABNORMAL LOW (ref 0.3–1.2)
Total Protein: 7.9 g/dL (ref 6.5–8.1)

## 2018-07-26 LAB — CBC WITH DIFFERENTIAL (CANCER CENTER ONLY)
Abs Immature Granulocytes: 0.02 10*3/uL (ref 0.00–0.07)
Basophils Absolute: 0 10*3/uL (ref 0.0–0.1)
Basophils Relative: 1 %
Eosinophils Absolute: 0.1 10*3/uL (ref 0.0–0.5)
Eosinophils Relative: 1 %
HCT: 34 % — ABNORMAL LOW (ref 36.0–46.0)
Hemoglobin: 10.7 g/dL — ABNORMAL LOW (ref 12.0–15.0)
Immature Granulocytes: 0 %
Lymphocytes Relative: 18 %
Lymphs Abs: 1.4 10*3/uL (ref 0.7–4.0)
MCH: 30.2 pg (ref 26.0–34.0)
MCHC: 31.5 g/dL (ref 30.0–36.0)
MCV: 96 fL (ref 80.0–100.0)
Monocytes Absolute: 1.1 10*3/uL — ABNORMAL HIGH (ref 0.1–1.0)
Monocytes Relative: 14 %
Neutro Abs: 4.9 10*3/uL (ref 1.7–7.7)
Neutrophils Relative %: 66 %
Platelet Count: 388 10*3/uL (ref 150–400)
RBC: 3.54 MIL/uL — ABNORMAL LOW (ref 3.87–5.11)
RDW: 12.8 % (ref 11.5–15.5)
WBC Count: 7.5 10*3/uL (ref 4.0–10.5)
nRBC: 0 % (ref 0.0–0.2)

## 2018-07-26 LAB — TSH: TSH: 2.032 u[IU]/mL (ref 0.308–3.960)

## 2018-07-26 MED ORDER — SODIUM CHLORIDE 0.9 % IV SOLN
Freq: Once | INTRAVENOUS | Status: AC
  Administered 2018-07-26: 11:00:00 via INTRAVENOUS
  Filled 2018-07-26: qty 250

## 2018-07-26 MED ORDER — SODIUM CHLORIDE 0.9 % IV SOLN
9.5000 mg/kg | Freq: Once | INTRAVENOUS | Status: AC
  Administered 2018-07-26: 500 mg via INTRAVENOUS
  Filled 2018-07-26: qty 10

## 2018-07-26 MED ORDER — HEPARIN SOD (PORK) LOCK FLUSH 100 UNIT/ML IV SOLN
500.0000 [IU] | Freq: Once | INTRAVENOUS | Status: DC | PRN
  Filled 2018-07-26: qty 5

## 2018-07-26 MED ORDER — SODIUM CHLORIDE 0.9% FLUSH
10.0000 mL | INTRAVENOUS | Status: DC | PRN
  Filled 2018-07-26: qty 10

## 2018-07-26 NOTE — Progress Notes (Signed)
[place] [phone]   Fax:([phone]  OFFICE PROGRESS NOTE  Default, Provider, MD No address on file  DIAGNOSIS: Stage IIIA (T3, N1/N2, M0) non-small cell lung cancer, adenocarcinoma presented with large right lower lobe lung mass with extension to the right hilum and subcarinal area diagnosed in July 2019.  Biomarker Findings Tumor Mutational Burden - TMB-Intermediate (6 Muts/Mb) Microsatellite status - MS-Stable Genomic Findings For a complete list of the genes assayed, please refer to the Appendix. NRAS Q61R ARAF amplification STK11 G56W KRAS G13D MYCN amplification MCL1 amplification NKX2-1 amplification - equivocal? TP53 G245V 7 Disease relevant genes with no reportable alterations: EGFR, ALK, BRAF, MET, ERBB2, RET, ROS1   PRIOR THERAPY: Course of concurrent chemoradiation with weekly carboplatin for AUC of 2 and paclitaxel 45 mg/M2.  Status post 7 cycles.  Last dose was giving 01/11/2018.  CURRENT THERAPY: Consolidation treatment with immunotherapy with Imfinzi (Durvalumab) 10 mg/KG every 2 weeks.  First dose February 09, 2018.  Status post 12 cycles.  INTERVAL HISTORY: KERSTEN Holder 63 y.o. female returns to the clinic today for follow-up visit.  The patient is feeling fine today with no concerning complaints.  She denied having any chest pain, shortness of breath, cough or hemoptysis.  She continues to have mild fatigue and arthralgia.  She has no nausea, vomiting, diarrhea or constipation.  She denied having any headache or visual changes.  The patient continues to tolerate her treatment with Imfinzi fairly well.  She is here today for evaluation after repeating CT scan of the chest for restaging of her disease.   MEDICAL HISTORY: Past Medical History:  Diagnosis Date  . Rheumatoid arthritis ([place])     ALLERGIES:  has No Known Allergies.  MEDICATIONS:  Current Outpatient Medications  Medication Sig Dispense Refill  .  aspirin EC 81 MG tablet Take 81 mg by mouth daily.    [name] (IMFINZI IV) Inject into the vein every 14 (fourteen) days.    . famotidine (PEPCID) 40 MG tablet Take 40 mg by mouth as needed.     . hydroxychloroquine (PLAQUENIL) 200 MG tablet TAKE 1 TABLET BY MOUTH TWICE DAILY MONDAY THROUGH FRIDAY 40 tablet 2  . naproxen sodium (ALEVE) 220 MG tablet Take 220 mg by mouth daily as needed (PAIN).    Marland Kitchen Phenylephrine-DM-GG-APAP (DELSYM COUGH/COLD DAYTIME PO) Take by mouth as needed.     . pseudoephedrine-guaifenesin (MUCINEX D) 60-600 MG 12 hr tablet Take 1 tablet by mouth as needed.     Marland Kitchen VITAMIN D PO Take by mouth.     No current facility-administered medications for this visit.     SURGICAL HISTORY:  Past Surgical History:  Procedure Laterality Date  . BACK SURGERY    . BRONCHIAL NEEDLE ASPIRATION BIOPSY  11/09/2017   Procedure: BRONCHIAL NEEDLE ASPIRATION BIOPSIES;  Surgeon: Marshell Garfinkel, MD;  Location: [facility];  Service: Cardiopulmonary;;  . ENDOBRONCHIAL ULTRASOUND Bilateral 11/09/2017   Procedure: ENDOBRONCHIAL ULTRASOUND;  Surgeon: Marshell Garfinkel, MD;  Location: [facility];  Service: Cardiopulmonary;  Laterality: Bilateral;  . NASAL SINUS SURGERY    . NASAL SINUS SURGERY    . TUBAL LIGATION    . VIDEO BRONCHOSCOPY  11/09/2017   Procedure: VIDEO BRONCHOSCOPY;  Surgeon: Marshell Garfinkel, MD;  [facility]: [facility];  Service: Cardiopulmonary;;    REVIEW OF SYSTEMS:  Constitutional: positive for fatigue Eyes: negative Ears, nose, mouth, throat, and face: negative Respiratory: negative Cardiovascular: negative Gastrointestinal: negative Genitourinary:negative Integument/breast: negative Hematologic/lymphatic: negative  Musculoskeletal:negative Neurological: negative Behavioral/Psych: negative Endocrine: negative Allergic/Immunologic: negative   PHYSICAL EXAMINATION: General appearance: alert, cooperative, fatigued and no distress Head: Normocephalic, without  obvious abnormality, atraumatic Neck: no adenopathy, no JVD, supple, symmetrical, trachea midline and thyroid not enlarged, symmetric, no tenderness/mass/nodules Lymph nodes: Cervical, supraclavicular, and axillary nodes normal. Resp: clear to auscultation bilaterally Back: symmetric, no curvature. ROM normal. No CVA tenderness. Cardio: regular rate and rhythm, S1, S2 normal, no murmur, click, rub or gallop GI: soft, non-tender; bowel sounds normal; no masses,  no organomegaly Extremities: extremities normal, atraumatic, no cyanosis or edema Neurologic: Alert and oriented X 3, normal strength and tone. Normal symmetric reflexes. Normal coordination and gait  ECOG PERFORMANCE STATUS: 1 - Symptomatic but completely ambulatory  Blood pressure (!) 146/74, pulse 92, temperature 98.3 F (36.8 C), temperature source Oral, resp. rate 20, height 5' 1" (1.549 m), weight 123 lb (55.8 kg), last menstrual period 04/28/2000, SpO2 100 %.  LABORATORY DATA: Lab Results  Component Value Date   WBC 7.5 07/26/2018   HGB 10.7 (L) 07/26/2018   HCT 34.0 (L) 07/26/2018   MCV 96.0 07/26/2018   PLT 388 07/26/2018      Chemistry      Component Value Date/Time   NA 138 07/12/2018 0857   K 4.3 07/12/2018 0857   CL 104 07/12/2018 0857   CO2 23 07/12/2018 0857   BUN 15 07/12/2018 0857   CREATININE 0.81 07/12/2018 0857   CREATININE 0.80 07/24/2017 1438      Component Value Date/Time   CALCIUM 9.8 07/12/2018 0857   ALKPHOS 108 07/12/2018 0857   AST 29 07/12/2018 0857   ALT 19 07/12/2018 0857   BILITOT <0.2 (L) 07/12/2018 0857       RADIOGRAPHIC STUDIES: Ct Chest W Contrast  Result Date: 07/24/2018 CLINICAL DATA:  Follow-up right lung cancer, on chemotherapy EXAM: CT CHEST WITH CONTRAST TECHNIQUE: Multidetector CT imaging of the chest was performed during intravenous contrast administration. CONTRAST:  16m OMNIPAQUE IOHEXOL 300 MG/ML  SOLN COMPARISON:  04/29/2018 FINDINGS: Cardiovascular: Heart is  normal in size.  No pericardial effusion. No evidence thoracic aortic aneurysm. Mild atherosclerotic calcifications of the aortic arch. Mild coronary atherosclerosis of the LAD and right coronary artery. Mediastinum/Nodes: No suspicious mediastinal lymphadenopathy. Small bilateral hilar nodes, within normal limits. Lungs/Pleura: Radiation changes in the right perihilar and infrahilar regions. Associated volume loss in the right hemithorax. Mild to moderate centrilobular emphysematous changes, upper lung predominant. No suspicious pulmonary nodules. No focal consolidation. No pleural effusion or pneumothorax. Upper Abdomen: Visualized upper abdomen is grossly unremarkable. Musculoskeletal: Degenerative changes of the mid thoracic spine. IMPRESSION: Radiation changes in the right hemithorax. No evidence of recurrent or metastatic disease. Aortic Atherosclerosis (ICD10-I70.0) and Emphysema (ICD10-J43.9). Electronically Signed   By: SJulian HyM.D.   On: 07/24/2018 04:27    ASSESSMENT AND PLAN: This is a very pleasant 63years old African-American female recently diagnosed with a stage IIIA non-small cell lung cancer, adenocarcinoma.  She underwent a course of concurrent chemoradiation with weekly carboplatin and paclitaxel status post 7 cycles with partial response.   The patient tolerated this course of treatment well except for mild odynophagia and dysphagia. She is currently on consolidation treatment with immunotherapy with Imfinzi (Durvalumab) status post 12 cycles. She has been tolerating this treatment well with no concerning adverse effects. She had repeat CT scan of the chest performed recently.  I personally and independently reviewed the scans and discussed the results with the patient today. Her scan showed  no concerning findings for disease progression. I recommended for the patient to continue her current treatment with Imfinzi and she will proceed with cycle #13 today. I will see the  patient back for follow-up visit in 2 weeks for evaluation before the next cycle of her treatment. For the arthralgia, she will continue with NSAIDs on as-needed basis. The patient was advised to call immediately if she has any concerning symptoms in the interval. The patient voices understanding of current disease status and treatment options and is in agreement with the current care plan. All questions were answered. The patient knows to call the clinic with any problems, questions or concerns. We can certainly see the patient much sooner if necessary.  Disclaimer: This note was dictated with voice recognition software. Similar sounding words can inadvertently be transcribed and may not be corrected upon review.

## 2018-07-26 NOTE — Patient Instructions (Signed)
[facility] Discharge Instructions for Patients Receiving Chemotherapy  Today you received the following chemotherapy agents: Imfinzi.  To help prevent nausea and vomiting after your treatment, we encourage you to take your nausea medication as directed.   If you develop nausea and vomiting that is not controlled by your nausea medication, call the clinic.   BELOW ARE SYMPTOMS THAT SHOULD BE REPORTED IMMEDIATELY:  *FEVER GREATER THAN 100.5 F  *CHILLS WITH OR WITHOUT FEVER  NAUSEA AND VOMITING THAT IS NOT CONTROLLED WITH YOUR NAUSEA MEDICATION  *UNUSUAL SHORTNESS OF BREATH  *UNUSUAL BRUISING OR BLEEDING  TENDERNESS IN MOUTH AND THROAT WITH OR WITHOUT PRESENCE OF ULCERS  *URINARY PROBLEMS  *BOWEL PROBLEMS  UNUSUAL RASH Items with * indicate a potential emergency and should be followed up as soon as possible.  Feel free to call the clinic should you have any questions or concerns. The clinic phone number is [phone].  Please show the CHEMO ALERT CARD at check-in to the Emergency Department and triage nurse.   

## 2018-08-09 ENCOUNTER — Inpatient Hospital Stay: Payer: BC Managed Care – PPO

## 2018-08-09 ENCOUNTER — Inpatient Hospital Stay: Payer: BC Managed Care – PPO | Admitting: Internal Medicine

## 2018-08-09 ENCOUNTER — Other Ambulatory Visit: Payer: Self-pay

## 2018-08-09 ENCOUNTER — Encounter: Payer: Self-pay | Admitting: Internal Medicine

## 2018-08-09 ENCOUNTER — Inpatient Hospital Stay: Payer: BC Managed Care – PPO | Attending: Internal Medicine

## 2018-08-09 VITALS — BP 114/71 | HR 106 | Temp 98.9°F | Resp 18 | Ht 61.0 in | Wt 122.5 lb

## 2018-08-09 VITALS — HR 89

## 2018-08-09 DIAGNOSIS — C3491 Malignant neoplasm of unspecified part of right bronchus or lung: Secondary | ICD-10-CM

## 2018-08-09 DIAGNOSIS — Z5112 Encounter for antineoplastic immunotherapy: Secondary | ICD-10-CM

## 2018-08-09 DIAGNOSIS — R1310 Dysphagia, unspecified: Secondary | ICD-10-CM | POA: Insufficient documentation

## 2018-08-09 DIAGNOSIS — C3431 Malignant neoplasm of lower lobe, right bronchus or lung: Secondary | ICD-10-CM

## 2018-08-09 DIAGNOSIS — Z79899 Other long term (current) drug therapy: Secondary | ICD-10-CM | POA: Diagnosis not present

## 2018-08-09 DIAGNOSIS — M069 Rheumatoid arthritis, unspecified: Secondary | ICD-10-CM | POA: Insufficient documentation

## 2018-08-09 LAB — CMP (CANCER CENTER ONLY)
ALT: 23 U/L (ref 0–44)
AST: 27 U/L (ref 15–41)
Albumin: 3.1 g/dL — ABNORMAL LOW (ref 3.5–5.0)
Alkaline Phosphatase: 128 U/L — ABNORMAL HIGH (ref 38–126)
Anion gap: 9 (ref 5–15)
BUN: 16 mg/dL (ref 8–23)
CO2: 24 mmol/L (ref 22–32)
Calcium: 9.7 mg/dL (ref 8.9–10.3)
Chloride: 104 mmol/L (ref 98–111)
Creatinine: 0.74 mg/dL (ref 0.44–1.00)
GFR, Est AFR Am: >60 mL/min (ref >60)
GFR, Estimated: >60 mL/min (ref >60)
Glucose, Bld: 87 mg/dL (ref 70–99)
Potassium: 4 mmol/L (ref 3.5–5.1)
Sodium: 137 mmol/L (ref 135–145)
Total Bilirubin: <0.2 mg/dL — ABNORMAL LOW (ref 0.3–1.2)
Total Protein: 7.8 g/dL (ref 6.5–8.1)

## 2018-08-09 LAB — CBC WITH DIFFERENTIAL (CANCER CENTER ONLY)
Abs Immature Granulocytes: 0.02 10*3/uL (ref 0.00–0.07)
Basophils Absolute: 0 10*3/uL (ref 0.0–0.1)
Basophils Relative: 0 %
Eosinophils Absolute: 0.1 10*3/uL (ref 0.0–0.5)
Eosinophils Relative: 2 %
HCT: 33.7 % — ABNORMAL LOW (ref 36.0–46.0)
Hemoglobin: 10.5 g/dL — ABNORMAL LOW (ref 12.0–15.0)
Immature Granulocytes: 0 %
Lymphocytes Relative: 20 %
Lymphs Abs: 1.4 10*3/uL (ref 0.7–4.0)
MCH: 29.9 pg (ref 26.0–34.0)
MCHC: 31.2 g/dL (ref 30.0–36.0)
MCV: 96 fL (ref 80.0–100.0)
Monocytes Absolute: 0.9 10*3/uL (ref 0.1–1.0)
Monocytes Relative: 14 %
Neutro Abs: 4.5 10*3/uL (ref 1.7–7.7)
Neutrophils Relative %: 64 %
Platelet Count: 382 10*3/uL (ref 150–400)
RBC: 3.51 MIL/uL — ABNORMAL LOW (ref 3.87–5.11)
RDW: 13.6 % (ref 11.5–15.5)
WBC Count: 7 10*3/uL (ref 4.0–10.5)
nRBC: 0 % (ref 0.0–0.2)

## 2018-08-09 MED ORDER — SODIUM CHLORIDE 0.9 % IV SOLN
Freq: Once | INTRAVENOUS | Status: AC
  Administered 2018-08-09: 13:00:00 via INTRAVENOUS
  Filled 2018-08-09: qty 250

## 2018-08-09 MED ORDER — SODIUM CHLORIDE 0.9 % IV SOLN
9.6000 mg/kg | Freq: Once | INTRAVENOUS | Status: AC
  Administered 2018-08-09: 500 mg via INTRAVENOUS
  Filled 2018-08-09: qty 10

## 2018-08-09 NOTE — Patient Instructions (Addendum)
Coronavirus (COVID-19) Are you at risk?  Are you at risk for the Coronavirus (COVID-19)?  To be considered HIGH RISK for Coronavirus (COVID-19), you have to meet the following criteria:  . Traveled to [place], [place], [place], [place] or [place]; or in the [place] to [place], [place], [place], or [place]; and have fever, cough, and shortness of breath within the last 2 weeks of travel OR . Been in close contact with a person diagnosed with COVID-19 within the last 2 weeks and have fever, cough, and shortness of breath . IF YOU DO NOT MEET THESE CRITERIA, YOU ARE CONSIDERED LOW RISK FOR COVID-19.  What to do if you are HIGH RISK for COVID-19?  . If you are having a medical emergency, call 911. . Seek medical care right away. Before you go to a doctor's office, urgent care or emergency department, call ahead and tell them about your recent travel, contact with someone diagnosed with COVID-19, and your symptoms. You should receive instructions from your physician's office regarding next steps of care.  . When you arrive at healthcare provider, tell the healthcare staff immediately you have returned from visiting [place], [place], [place], [place] or [place]; or traveled in the [place] to [place], [place], [place], or [place]; in the last two weeks or you have been in close contact with a person diagnosed with COVID-19 in the last 2 weeks.   . Tell the health care staff about your symptoms: fever, cough and shortness of breath. . After you have been seen by a medical provider, you will be either: o Tested for (COVID-19) and discharged home on quarantine except to seek medical care if symptoms worsen, and asked to  - Stay home and avoid contact with others until you get your results (4-5 days)  - Avoid travel on public transportation if possible (such as bus, train, or airplane) or o Sent to the Emergency Department by EMS for evaluation, COVID-19 testing, and possible  admission depending on your condition and test results.  What to do if you are LOW RISK for COVID-19?  Reduce your risk of any infection by using the same precautions used for avoiding the common cold or flu:  . Wash your hands often with soap and warm water for at least 20 seconds.  If soap and water are not readily available, use an alcohol-based hand sanitizer with at least 60% alcohol.  . If coughing or sneezing, cover your mouth and nose by coughing or sneezing into the elbow areas of your shirt or coat, into a tissue or into your sleeve (not your hands). . Avoid shaking hands with others and consider head nods or verbal greetings only. . Avoid touching your eyes, nose, or mouth with unwashed hands.  . Avoid close contact with people who are sick. . Avoid places or events with large numbers of people in one location, like concerts or sporting events. . Carefully consider travel plans you have or are making. . If you are planning any travel outside or inside the [place], visit the CDC's Travelers' Health webpage for the latest health notices. . If you have some symptoms but not all symptoms, continue to monitor at home and seek medical attention if your symptoms worsen. . If you are having a medical emergency, call 911.   ADDITIONAL HEALTHCARE OPTIONS FOR PATIENTS  [facility] / e-Visit: [email or web address]         [facility]: [phone]     Urgent Care: [facility]: [place] Discharge Instructions for Patients Receiving Chemotherapy  Today you received the following chemotherapy agents: Durvalumab (Imfinzi)  To help prevent nausea and vomiting after your treatment, we encourage you to take your nausea medication as directed.    If you develop nausea and vomiting that is not controlled by your nausea medication, call the  clinic.   BELOW ARE SYMPTOMS THAT SHOULD BE REPORTED IMMEDIATELY:  *FEVER GREATER THAN 100.5 F  *CHILLS WITH OR WITHOUT FEVER  NAUSEA AND VOMITING THAT IS NOT CONTROLLED WITH YOUR NAUSEA MEDICATION  *UNUSUAL SHORTNESS OF BREATH  *UNUSUAL BRUISING OR BLEEDING  TENDERNESS IN MOUTH AND THROAT WITH OR WITHOUT PRESENCE OF ULCERS  *URINARY PROBLEMS  *BOWEL PROBLEMS  UNUSUAL RASH Items with * indicate a potential emergency and should be followed up as soon as possible.  Feel free to call the clinic should you have any questions or concerns. The clinic phone number is [phone].  Please show the [place] at check-in to the Emergency Department and triage nurse.  [facility] Discharge Instructions for Patients Receiving Chemotherapy  Today you received the following chemotherapy agents: Imfinzi  To help prevent nausea and vomiting after your treatment, we encourage you to take your nausea medication as directed.   If you develop nausea and vomiting that is not controlled by your nausea medication, call the clinic.   BELOW ARE SYMPTOMS THAT SHOULD BE REPORTED IMMEDIATELY:  *FEVER GREATER THAN 100.5 F  *CHILLS WITH OR WITHOUT FEVER  NAUSEA AND VOMITING THAT IS NOT CONTROLLED WITH YOUR NAUSEA MEDICATION  *UNUSUAL SHORTNESS OF BREATH  *UNUSUAL BRUISING OR BLEEDING  TENDERNESS IN MOUTH AND THROAT WITH OR WITHOUT PRESENCE OF ULCERS  *URINARY PROBLEMS  *BOWEL PROBLEMS  UNUSUAL RASH Items with * indicate a potential emergency and should be followed up as soon as possible.  Feel free to call the clinic should you have any questions or concerns. The clinic phone number is [phone].  Please show the [place] at check-in to the Emergency Department and triage nurse.

## 2018-08-09 NOTE — Progress Notes (Signed)
[place] [phone]) [phone]   [phone]) [phone]  OFFICE PROGRESS NOTE  Default, Provider, MD No address on file  DIAGNOSIS: Stage IIIA (T3, N1/N2, M0) non-small cell lung cancer, adenocarcinoma presented with large right lower lobe lung mass with extension to the right hilum and subcarinal area diagnosed in July 2019.  Biomarker Findings Tumor Mutational Burden - TMB-Intermediate (6 Muts/Mb) Microsatellite status - MS-Stable Genomic Findings For a complete list of the genes assayed, please refer to the Appendix. NRAS Q61R ARAF amplification STK11 G56W KRAS G13D MYCN amplification MCL1 amplification NKX2-1 amplification - equivocal TP53 G245V 7 Disease relevant genes with no reportable alterations: EGFR, ALK, BRAF, MET, ERBB2, RET, ROS1   PRIOR THERAPY: Course of concurrent chemoradiation with weekly carboplatin for AUC of 2 and paclitaxel 45 mg/M2.  Status post 7 cycles.  Last dose was giving 01/11/2018.  CURRENT THERAPY: Consolidation treatment with immunotherapy with Imfinzi (Durvalumab) 10 mg/KG every 2 weeks.  First dose February 09, 2018.  Status post 13 cycles.  INTERVAL HISTORY: Susan Holder 63 y.o. female returns to the clinic today for follow-up visit.  The patient is feeling fine today with no concerning complaints.  She continues to tolerate her treatment with Imfinzi fairly well.  She denied having any chest pain, shortness of breath, cough or hemoptysis.  She has no nausea, vomiting, diarrhea or constipation.  She noticed a submandibular lymph node recently but this is improved in the last few days.  She denied having any weight loss or night sweats.  She is here today for evaluation before starting cycle #14.   MEDICAL HISTORY: Past Medical History:  Diagnosis Date   Rheumatoid arthritis ([place])     ALLERGIES:  has No Known Allergies.  MEDICATIONS:  Current Outpatient Medications  Medication Sig Dispense Refill   aspirin EC  81 MG tablet Take 81 mg by mouth daily.     Durvalumab (IMFINZI IV) Inject into the vein every 14 (fourteen) days.     famotidine (PEPCID) 40 MG tablet Take 40 mg by mouth as needed.      hydroxychloroquine (PLAQUENIL) 200 MG tablet TAKE 1 TABLET BY MOUTH TWICE DAILY MONDAY THROUGH FRIDAY 40 tablet 2   naproxen sodium (ALEVE) 220 MG tablet Take 220 mg by mouth daily as needed (PAIN).     Phenylephrine-DM-GG-APAP (DELSYM COUGH/COLD DAYTIME PO) Take by mouth as needed.      pseudoephedrine-guaifenesin (MUCINEX D) 60-600 MG 12 hr tablet Take 1 tablet by mouth as needed.      VITAMIN D PO Take by mouth.     No current facility-administered medications for this visit.     SURGICAL HISTORY:  Past Surgical History:  Procedure Laterality Date   BACK SURGERY     BRONCHIAL NEEDLE ASPIRATION BIOPSY  11/09/2017   Procedure: BRONCHIAL NEEDLE ASPIRATION BIOPSIES;  Surgeon: Marshell Garfinkel, MD;  Location: [facility];  Service: Cardiopulmonary;;   ENDOBRONCHIAL ULTRASOUND Bilateral 11/09/2017   Procedure: ENDOBRONCHIAL ULTRASOUND;  Surgeon: Marshell Garfinkel, MD;  Location: [facility];  Service: Cardiopulmonary;  Laterality: Bilateral;   NASAL SINUS SURGERY     NASAL SINUS SURGERY     TUBAL LIGATION     VIDEO BRONCHOSCOPY  11/09/2017   Procedure: VIDEO BRONCHOSCOPY;  Surgeon: Marshell Garfinkel, MD;  Location: [facility];  Service: Cardiopulmonary;;    REVIEW OF SYSTEMS:  A comprehensive review of systems was negative except for: Constitutional: positive for fatigue   PHYSICAL EXAMINATION: General appearance: alert, cooperative, fatigued and no  distress Head: Normocephalic, without obvious abnormality, atraumatic Neck: no adenopathy, no JVD, supple, symmetrical, trachea midline and thyroid not enlarged, symmetric, no tenderness/mass/nodules Lymph nodes: Cervical, supraclavicular, and axillary nodes normal. Resp: clear to auscultation bilaterally Back: symmetric, no curvature. ROM  normal. No CVA tenderness. Cardio: regular rate and rhythm, S1, S2 normal, no murmur, click, rub or gallop GI: soft, non-tender; bowel sounds normal; no masses,  no organomegaly Extremities: extremities normal, atraumatic, no cyanosis or edema  ECOG PERFORMANCE STATUS: 1 - Symptomatic but completely ambulatory  Blood pressure 114/71, pulse (!) 106, temperature 98.9 F (37.2 C), temperature source Oral, resp. rate 18, height _0  (1.549 m), weight 122 lb 8 oz (55.6 kg), last menstrual period 04/28/2000, SpO2 100 %.  LABORATORY DATA: Lab Results  Component Value Date   WBC 7.0 08/09/2018   HGB 10.5 (L) 08/09/2018   HCT 33.7 (L) 08/09/2018   MCV 96.0 08/09/2018   PLT 382 08/09/2018      Chemistry      Component Value Date/Time   NA 138 07/26/2018 0940   K 4.2 07/26/2018 0940   CL 103 07/26/2018 0940   CO2 25 07/26/2018 0940   BUN 14 07/26/2018 0940   CREATININE 0.77 07/26/2018 0940   CREATININE 0.80 07/24/2017 1438      Component Value Date/Time   CALCIUM 9.9 07/26/2018 0940   ALKPHOS 119 07/26/2018 0940   AST 31 07/26/2018 0940   ALT 30 07/26/2018 0940   BILITOT <0.2 (L) 07/26/2018 0940       RADIOGRAPHIC STUDIES: Ct Chest W Contrast  Result Date: 07/24/2018 CLINICAL DATA:  Follow-up right lung cancer, on chemotherapy EXAM: CT CHEST WITH CONTRAST TECHNIQUE: Multidetector CT imaging of the chest was performed during intravenous contrast administration. CONTRAST:  66m OMNIPAQUE IOHEXOL 300 MG/ML  SOLN COMPARISON:  04/29/2018 FINDINGS: Cardiovascular: Heart is normal in size.  No pericardial effusion. No evidence thoracic aortic aneurysm. Mild atherosclerotic calcifications of the aortic arch. Mild coronary atherosclerosis of the LAD and right coronary artery. Mediastinum/Nodes: No suspicious mediastinal lymphadenopathy. Small bilateral hilar nodes, within normal limits. Lungs/Pleura: Radiation changes in the right perihilar and infrahilar regions. Associated volume loss in  the right hemithorax. Mild to moderate centrilobular emphysematous changes, upper lung predominant. No suspicious pulmonary nodules. No focal consolidation. No pleural effusion or pneumothorax. Upper Abdomen: Visualized upper abdomen is grossly unremarkable. Musculoskeletal: Degenerative changes of the mid thoracic spine. IMPRESSION: Radiation changes in the right hemithorax. No evidence of recurrent or metastatic disease. Aortic Atherosclerosis (ICD10-I70.0) and Emphysema (ICD10-J43.9). Electronically Signed   By: SJulian HyM.D.   On: 07/24/2018 04:27    ASSESSMENT AND PLAN: This is a very pleasant 63years old African-American female recently diagnosed with a stage IIIA non-small cell lung cancer, adenocarcinoma.  She underwent a course of concurrent chemoradiation with weekly carboplatin and paclitaxel status post 7 cycles with partial response.   The patient tolerated this course of treatment well except for mild odynophagia and dysphagia. She is currently on consolidation treatment with immunotherapy with Imfinzi (Durvalumab) status post 13 cycles. The patient continues to tolerate this treatment well with no concerning adverse effects. I recommended for her to proceed with cycle #14 today as scheduled. She will come back for follow-up visit in 2 weeks for evaluation before the next cycle of her treatment. She was advised to call immediately if she has any concerning symptoms in the interval. The patient voices understanding of current disease status and treatment options and is in agreement with the  current care plan. All questions were answered. The patient knows to call the clinic with any problems, questions or concerns. We can certainly see the patient much sooner if necessary.  Disclaimer: This note was dictated with voice recognition software. Similar sounding words can inadvertently be transcribed and may not be corrected upon review.

## 2018-08-10 ENCOUNTER — Telehealth: Payer: Self-pay | Admitting: Internal Medicine

## 2018-08-10 NOTE — Telephone Encounter (Signed)
Tried to reach °

## 2018-08-11 ENCOUNTER — Other Ambulatory Visit: Payer: Self-pay | Admitting: Rheumatology

## 2018-08-11 DIAGNOSIS — M0579 Rheumatoid arthritis with rheumatoid factor of multiple sites without organ or systems involvement: Secondary | ICD-10-CM

## 2018-08-11 NOTE — Telephone Encounter (Signed)
Last Visit: 06/01/2018 Next Visit: 11/16/2018 Labs: 4/31/2020 stable  Eye exam: 04/29/2017   Attempted to contact patient and left message on machine to advise patient we are needing updated PLQ eye exam.   Okay to refill per Dr. Estanislado Pandy.

## 2018-08-20 ENCOUNTER — Telehealth: Payer: Self-pay | Admitting: Rheumatology

## 2018-08-20 NOTE — Telephone Encounter (Signed)
Patient called stating the pharmacist at [facility] called to let her know that there was a problem with her insurance approving the refill for her Plaquenil.  Patient states it might be because the quantity is more than her insurance will allow.  Patient requested a return call.

## 2018-08-20 NOTE — Telephone Encounter (Signed)
Spoke with patient and she states she was told that her insurance was not cover her PLQ. Contacted [facility] and spoke with the pharmacist, she ran the prescription and it is covered. She states there are new technicians in the pharmacy. Advised the patient.

## 2018-08-23 ENCOUNTER — Encounter: Payer: Self-pay | Admitting: Physician Assistant

## 2018-08-23 ENCOUNTER — Inpatient Hospital Stay: Payer: BC Managed Care – PPO

## 2018-08-23 ENCOUNTER — Other Ambulatory Visit: Payer: Self-pay

## 2018-08-23 ENCOUNTER — Inpatient Hospital Stay (HOSPITAL_BASED_OUTPATIENT_CLINIC_OR_DEPARTMENT_OTHER): Payer: BC Managed Care – PPO | Admitting: Physician Assistant

## 2018-08-23 VITALS — BP 128/71 | HR 114 | Temp 99.8°F | Resp 18 | Ht 61.0 in | Wt 122.3 lb

## 2018-08-23 VITALS — HR 105

## 2018-08-23 DIAGNOSIS — C3431 Malignant neoplasm of lower lobe, right bronchus or lung: Secondary | ICD-10-CM

## 2018-08-23 DIAGNOSIS — M069 Rheumatoid arthritis, unspecified: Secondary | ICD-10-CM | POA: Diagnosis not present

## 2018-08-23 DIAGNOSIS — R1310 Dysphagia, unspecified: Secondary | ICD-10-CM

## 2018-08-23 DIAGNOSIS — Z5112 Encounter for antineoplastic immunotherapy: Secondary | ICD-10-CM | POA: Diagnosis not present

## 2018-08-23 DIAGNOSIS — C3491 Malignant neoplasm of unspecified part of right bronchus or lung: Secondary | ICD-10-CM

## 2018-08-23 LAB — CBC WITH DIFFERENTIAL (CANCER CENTER ONLY)
Abs Immature Granulocytes: 0.02 10*3/uL (ref 0.00–0.07)
Basophils Absolute: 0 10*3/uL (ref 0.0–0.1)
Basophils Relative: 0 %
Eosinophils Absolute: 0.1 10*3/uL (ref 0.0–0.5)
Eosinophils Relative: 1 %
HCT: 33.2 % — ABNORMAL LOW (ref 36.0–46.0)
Hemoglobin: 10.4 g/dL — ABNORMAL LOW (ref 12.0–15.0)
Immature Granulocytes: 0 %
Lymphocytes Relative: 20 %
Lymphs Abs: 1.4 10*3/uL (ref 0.7–4.0)
MCH: 29.5 pg (ref 26.0–34.0)
MCHC: 31.3 g/dL (ref 30.0–36.0)
MCV: 94.1 fL (ref 80.0–100.0)
Monocytes Absolute: 0.9 10*3/uL (ref 0.1–1.0)
Monocytes Relative: 12 %
Neutro Abs: 4.9 10*3/uL (ref 1.7–7.7)
Neutrophils Relative %: 67 %
Platelet Count: 341 10*3/uL (ref 150–400)
RBC: 3.53 MIL/uL — ABNORMAL LOW (ref 3.87–5.11)
RDW: 14.2 % (ref 11.5–15.5)
WBC Count: 7.3 10*3/uL (ref 4.0–10.5)
nRBC: 0 % (ref 0.0–0.2)

## 2018-08-23 LAB — CMP (CANCER CENTER ONLY)
ALT: 22 U/L (ref 0–44)
AST: 22 U/L (ref 15–41)
Albumin: 3.1 g/dL — ABNORMAL LOW (ref 3.5–5.0)
Alkaline Phosphatase: 120 U/L (ref 38–126)
Anion gap: 11 (ref 5–15)
BUN: 18 mg/dL (ref 8–23)
CO2: 21 mmol/L — ABNORMAL LOW (ref 22–32)
Calcium: 9.7 mg/dL (ref 8.9–10.3)
Chloride: 105 mmol/L (ref 98–111)
Creatinine: 0.77 mg/dL (ref 0.44–1.00)
GFR, Est AFR Am: >60 mL/min (ref >60)
GFR, Estimated: >60 mL/min (ref >60)
Glucose, Bld: 101 mg/dL — ABNORMAL HIGH (ref 70–99)
Potassium: 4 mmol/L (ref 3.5–5.1)
Sodium: 137 mmol/L (ref 135–145)
Total Bilirubin: <0.2 mg/dL — ABNORMAL LOW (ref 0.3–1.2)
Total Protein: 7.8 g/dL (ref 6.5–8.1)

## 2018-08-23 LAB — TSH: TSH: 1.38 u[IU]/mL (ref 0.308–3.960)

## 2018-08-23 MED ORDER — SODIUM CHLORIDE 0.9 % IV SOLN
Freq: Once | INTRAVENOUS | Status: AC
  Administered 2018-08-23: 15:00:00 via INTRAVENOUS
  Filled 2018-08-23: qty 250

## 2018-08-23 MED ORDER — SODIUM CHLORIDE 0.9 % IV SOLN
9.3000 mg/kg | Freq: Once | INTRAVENOUS | Status: AC
  Administered 2018-08-23: 500 mg via INTRAVENOUS
  Filled 2018-08-23: qty 10

## 2018-08-23 NOTE — Progress Notes (Signed)
[facility] OFFICE PROGRESS NOTE  Default, Provider, MD No address on file  DIAGNOSIS: Stage IIIA (T3,N1/N2, M0)non-small cell lung cancer, adenocarcinoma presented with large right lower lobe lung mass with extension to the right hilum and subcarinal area diagnosed in July 2019.  Biomarker Findings Tumor Mutational Burden - TMB-Intermediate (6 Muts/Mb) Microsatellite status - MS-Stable Genomic Findings For a complete list of the genes assayed, please refer to the Appendix. NRAS Q61R ARAF amplification STK11 G56W KRAS G13D MYCN amplification MCL1 amplification NKX2-1 amplification - equivocal? TP53 G245V 7 Disease relevant genes with no reportable alterations: EGFR, ALK, BRAF, MET, ERBB2, RET, ROS1   PRIOR THERAPY: Course of concurrent chemoradiation with weekly carboplatin for AUC of 2 and paclitaxel 45 mg/M2.  Status post 7 cycles.  Last dose was giving 01/11/2018.  CURRENT THERAPY: Consolidation treatment with immunotherapy with Imfinzi (Durvalumab) 10 mg/KG every 2 weeks.  First dose February 09, 2018.  Status post 14 cycles.  INTERVAL HISTORY: Susan Holder 63 y.o. female returns to the clinic today a for follow-up visit.  The patient is feeling fair today but continues to endorse fatigue and myalgias/arthralgias with treatment. She takes tylenol and aleve for her symptoms. She also reports a dry cough but denies any chest pain, shortness of breath, or hemoptysis, nasal congestion, or fevers.  She denies any night sweats or weight loss.  She denies any nausea, vomiting, diarrhea, or constipation.  She denies any headache or visual changes.  She denies any rashes or skin changes.  She is here today for evaluation prior to starting cycle #15.  MEDICAL HISTORY: Past Medical History:  Diagnosis Date  . Rheumatoid arthritis ([place])     ALLERGIES:  has No Known Allergies.  MEDICATIONS:  Current Outpatient Medications  Medication Sig Dispense Refill  . aspirin  EC 81 MG tablet Take 81 mg by mouth daily.    [name] (IMFINZI IV) Inject into the vein every 14 (fourteen) days.    . famotidine (PEPCID) 40 MG tablet Take 40 mg by mouth as needed.     . hydroxychloroquine (PLAQUENIL) 200 MG tablet TAKE 1 TABLET BY MOUTH TWICE DAILY MONDAY THROUGH FRIDAY 120 tablet 0  . naproxen sodium (ALEVE) 220 MG tablet Take 220 mg by mouth daily as needed (PAIN).    Marland Kitchen Phenylephrine-DM-GG-APAP (DELSYM COUGH/COLD DAYTIME PO) Take by mouth as needed.     . pseudoephedrine-guaifenesin (MUCINEX D) 60-600 MG 12 hr tablet Take 1 tablet by mouth as needed.     Marland Kitchen VITAMIN D PO Take by mouth.     No current facility-administered medications for this visit.     SURGICAL HISTORY:  Past Surgical History:  Procedure Laterality Date  . BACK SURGERY    . BRONCHIAL NEEDLE ASPIRATION BIOPSY  11/09/2017   Procedure: BRONCHIAL NEEDLE ASPIRATION BIOPSIES;  Surgeon: Marshell Garfinkel, MD;  Location: [facility];  Service: Cardiopulmonary;;  . ENDOBRONCHIAL ULTRASOUND Bilateral 11/09/2017   Procedure: ENDOBRONCHIAL ULTRASOUND;  Surgeon: Marshell Garfinkel, MD;  Location: [facility];  Service: Cardiopulmonary;  Laterality: Bilateral;  . NASAL SINUS SURGERY    . NASAL SINUS SURGERY    . TUBAL LIGATION    . VIDEO BRONCHOSCOPY  11/09/2017   Procedure: VIDEO BRONCHOSCOPY;  Surgeon: Marshell Garfinkel, MD;  Location: [facility];  Service: Cardiopulmonary;;    REVIEW OF SYSTEMS:   Review of Systems  Constitutional: Positive for fatigue. Negative for appetite change, chills, fever and unexpected weight change.  HENT:   Negative for mouth sores, nosebleeds, sore  throat and trouble swallowing.   Eyes: Negative for eye problems and icterus.  Respiratory: Positive for dry cough. Negative for hemoptysis, shortness of breath and wheezing.   Cardiovascular: Negative for chest pain and leg swelling.  Gastrointestinal: Negative for abdominal pain, constipation, diarrhea, nausea and vomiting.   Genitourinary: Negative for bladder incontinence, difficulty urinating, dysuria, frequency and hematuria.   Musculoskeletal: Positive for myalgias/arthralgias especially in her wrist, ankles, and shoulders. Negative for back pain, gait problem, neck pain and neck stiffness.  Skin: Negative for itching and rash.  Neurological: Negative for dizziness, extremity weakness, gait problem, headaches, light-headedness and seizures.  Hematological: Negative for adenopathy. Does not bruise/bleed easily.  Psychiatric/Behavioral: Negative for confusion, depression and sleep disturbance. The patient is not nervous/anxious.     PHYSICAL EXAMINATION:  Blood pressure 128/71, pulse (!) 114, temperature 99.8 F (37.7 C), temperature source Oral, resp. rate 18, height _0  (1.549 m), weight 122 lb 4.8 oz (55.5 kg), last menstrual period 04/28/2000, SpO2 99 %.  ECOG PERFORMANCE STATUS: 1 - Symptomatic but completely ambulatory  Physical Exam  Constitutional: Oriented to person, place, and time and well-developed, well-nourished, and in no distress.  HENT:  Head: Normocephalic and atraumatic.  Mouth/Throat: Oropharynx is clear and moist. No oropharyngeal exudate.  Eyes: Conjunctivae are normal. Right eye exhibits no discharge. Left eye exhibits no discharge. No scleral icterus.  Neck: Normal range of motion. Neck supple.  Cardiovascular: Normal rate, regular rhythm, normal heart sounds and intact distal pulses.   Pulmonary/Chest: Effort normal and breath sounds normal. No respiratory distress. No wheezes. No rales.  Abdominal: Soft. Bowel sounds are normal. Exhibits no distension and no mass. There is no tenderness.  Musculoskeletal: Normal range of motion. Exhibits no edema.  Lymphadenopathy:    No cervical adenopathy.  Neurological: Alert and oriented to person, place, and time. Exhibits normal muscle tone. Gait normal. Coordination normal.  Skin: Skin is warm and dry. No rash noted. Not diaphoretic. No  erythema. No pallor.  Psychiatric: Mood, memory and judgment normal.  Vitals reviewed.  LABORATORY DATA: Lab Results  Component Value Date   WBC 7.3 08/23/2018   HGB 10.4 (L) 08/23/2018   HCT 33.2 (L) 08/23/2018   MCV 94.1 08/23/2018   PLT 341 08/23/2018      Chemistry      Component Value Date/Time   NA 137 08/23/2018 1410   K 4.0 08/23/2018 1410   CL 105 08/23/2018 1410   CO2 21 (L) 08/23/2018 1410   BUN 18 08/23/2018 1410   CREATININE 0.77 08/23/2018 1410   CREATININE 0.80 07/24/2017 1438      Component Value Date/Time   CALCIUM 9.7 08/23/2018 1410   ALKPHOS 120 08/23/2018 1410   AST 22 08/23/2018 1410   ALT 22 08/23/2018 1410   BILITOT <0.2 (L) 08/23/2018 1410       RADIOGRAPHIC STUDIES:  No results found.   ASSESSMENT/PLAN:  This is a very pleasant 63 year old African-American female recently diagnosed with stage IIIa non-small cell lung cancer, adenocarcinoma.  She presented with a large right lower lobe lung mass with extension to the right hilum and subcarinal area. She was diagnosed in July 2019.   She underwent a course of concurrent chemoradiation with weekly carboplatin and paclitaxel.  She is status post 7 cycles with a partial response. The patient tolerated this treatment well except for mild odynophagia and dysphasia. She is currently undergoing consolidation immunotherapy with Imfinzi 10 mg/kg IV every 2 weeks.  She is status post 14 cycles.  She is tolerating treatment well without any adverse effects.  The patient was seen with Dr. Julien Nordmann today.  Labs were reviewed with the patient.  We recommend that she proceed with cycle #15 as scheduled.  She will continue taking aleve as needed for her myalgias/arthralgias. I will see her back for follow-up visit in 2 weeks for evaluation before starting cycle #16. The patient was advised to call immediately if she has any concerning symptoms in the interval. The patient voices understanding of current  disease status and treatment options and is in agreement with the current care plan. All questions were answered. The patient knows to call the clinic with any problems, questions or concerns. We can certainly see the patient much sooner if necessary  No orders of the defined types were placed in this encounter.     L , PA-C 08/23/18  ADDENDUM: Hematology/Oncology Attending: I had a face-to-face encounter with the patient today.  I recommended her care plan.  This is a very pleasant 63 years old African-American female with a stage IIIa non-small cell lung cancer, adenocarcinoma status post induction concurrent chemoradiation with weekly carboplatin and paclitaxel with partial response.  She is currently on consolidation treatment with Imfinzi status post 14 cycles and has been tolerating this treatment well except for arthralgia. I recommended for the patient to proceed with cycle #15 today. I will see her back for follow-up visit in 2 weeks for evaluation before the next cycle of her treatment. She was advised to call immediately if she has any concerning symptoms in the interval.  Disclaimer: This note was dictated with voice recognition software. Similar sounding words can inadvertently be transcribed and may be missed upon review. Eilleen Kempf, MD 08/23/18

## 2018-08-23 NOTE — Patient Instructions (Signed)
[facility] Discharge Instructions for Patients Receiving Chemotherapy  Today you received the following chemotherapy agents: Imfinzi.  To help prevent nausea and vomiting after your treatment, we encourage you to take your nausea medication as directed.   If you develop nausea and vomiting that is not controlled by your nausea medication, call the clinic.   BELOW ARE SYMPTOMS THAT SHOULD BE REPORTED IMMEDIATELY:  *FEVER GREATER THAN 100.5 F  *CHILLS WITH OR WITHOUT FEVER  NAUSEA AND VOMITING THAT IS NOT CONTROLLED WITH YOUR NAUSEA MEDICATION  *UNUSUAL SHORTNESS OF BREATH  *UNUSUAL BRUISING OR BLEEDING  TENDERNESS IN MOUTH AND THROAT WITH OR WITHOUT PRESENCE OF ULCERS  *URINARY PROBLEMS  *BOWEL PROBLEMS  UNUSUAL RASH Items with * indicate a potential emergency and should be followed up as soon as possible.  Feel free to call the clinic should you have any questions or concerns. The clinic phone number is [phone].  Please show the CHEMO ALERT CARD at check-in to the Emergency Department and triage nurse.   

## 2018-09-06 ENCOUNTER — Other Ambulatory Visit: Payer: Self-pay

## 2018-09-06 ENCOUNTER — Inpatient Hospital Stay: Payer: BC Managed Care – PPO | Attending: Internal Medicine

## 2018-09-06 ENCOUNTER — Inpatient Hospital Stay (HOSPITAL_BASED_OUTPATIENT_CLINIC_OR_DEPARTMENT_OTHER): Payer: BC Managed Care – PPO | Admitting: Physician Assistant

## 2018-09-06 ENCOUNTER — Inpatient Hospital Stay: Payer: BC Managed Care – PPO

## 2018-09-06 ENCOUNTER — Encounter: Payer: Self-pay | Admitting: Physician Assistant

## 2018-09-06 VITALS — HR 92

## 2018-09-06 VITALS — BP 123/72 | HR 114 | Temp 98.3°F | Resp 18 | Ht 61.0 in | Wt 123.6 lb

## 2018-09-06 DIAGNOSIS — C3431 Malignant neoplasm of lower lobe, right bronchus or lung: Secondary | ICD-10-CM | POA: Diagnosis present

## 2018-09-06 DIAGNOSIS — C3491 Malignant neoplasm of unspecified part of right bronchus or lung: Secondary | ICD-10-CM

## 2018-09-06 DIAGNOSIS — Z79899 Other long term (current) drug therapy: Secondary | ICD-10-CM | POA: Insufficient documentation

## 2018-09-06 DIAGNOSIS — R1310 Dysphagia, unspecified: Secondary | ICD-10-CM | POA: Diagnosis not present

## 2018-09-06 DIAGNOSIS — Z5112 Encounter for antineoplastic immunotherapy: Secondary | ICD-10-CM | POA: Insufficient documentation

## 2018-09-06 DIAGNOSIS — M069 Rheumatoid arthritis, unspecified: Secondary | ICD-10-CM | POA: Diagnosis not present

## 2018-09-06 DIAGNOSIS — Z7982 Long term (current) use of aspirin: Secondary | ICD-10-CM | POA: Diagnosis not present

## 2018-09-06 LAB — CMP (CANCER CENTER ONLY)
ALT: 19 U/L (ref 0–44)
AST: 23 U/L (ref 15–41)
Albumin: 3.2 g/dL — ABNORMAL LOW (ref 3.5–5.0)
Alkaline Phosphatase: 117 U/L (ref 38–126)
Anion gap: 10 (ref 5–15)
BUN: 18 mg/dL (ref 8–23)
CO2: 25 mmol/L (ref 22–32)
Calcium: 9.7 mg/dL (ref 8.9–10.3)
Chloride: 105 mmol/L (ref 98–111)
Creatinine: 1.01 mg/dL — ABNORMAL HIGH (ref 0.44–1.00)
GFR, Est AFR Am: >60 mL/min (ref >60)
GFR, Estimated: 59 mL/min — ABNORMAL LOW (ref >60)
Glucose, Bld: 108 mg/dL — ABNORMAL HIGH (ref 70–99)
Potassium: 3.9 mmol/L (ref 3.5–5.1)
Sodium: 140 mmol/L (ref 135–145)
Total Bilirubin: <0.2 mg/dL — ABNORMAL LOW (ref 0.3–1.2)
Total Protein: 8 g/dL (ref 6.5–8.1)

## 2018-09-06 LAB — CBC WITH DIFFERENTIAL (CANCER CENTER ONLY)
Abs Immature Granulocytes: 0.03 10*3/uL (ref 0.00–0.07)
Basophils Absolute: 0 10*3/uL (ref 0.0–0.1)
Basophils Relative: 0 %
Eosinophils Absolute: 0.1 10*3/uL (ref 0.0–0.5)
Eosinophils Relative: 2 %
HCT: 35.5 % — ABNORMAL LOW (ref 36.0–46.0)
Hemoglobin: 11.1 g/dL — ABNORMAL LOW (ref 12.0–15.0)
Immature Granulocytes: 0 %
Lymphocytes Relative: 19 %
Lymphs Abs: 1.3 10*3/uL (ref 0.7–4.0)
MCH: 29.1 pg (ref 26.0–34.0)
MCHC: 31.3 g/dL (ref 30.0–36.0)
MCV: 93.2 fL (ref 80.0–100.0)
Monocytes Absolute: 0.8 10*3/uL (ref 0.1–1.0)
Monocytes Relative: 12 %
Neutro Abs: 4.7 10*3/uL (ref 1.7–7.7)
Neutrophils Relative %: 67 %
Platelet Count: 369 10*3/uL (ref 150–400)
RBC: 3.81 MIL/uL — ABNORMAL LOW (ref 3.87–5.11)
RDW: 14.7 % (ref 11.5–15.5)
WBC Count: 7 10*3/uL (ref 4.0–10.5)
nRBC: 0 % (ref 0.0–0.2)

## 2018-09-06 MED ORDER — SODIUM CHLORIDE 0.9 % IV SOLN
9.3000 mg/kg | Freq: Once | INTRAVENOUS | Status: AC
  Administered 2018-09-06: 500 mg via INTRAVENOUS
  Filled 2018-09-06: qty 10

## 2018-09-06 MED ORDER — SODIUM CHLORIDE 0.9 % IV SOLN
Freq: Once | INTRAVENOUS | Status: AC
  Administered 2018-09-06: 15:00:00 via INTRAVENOUS
  Filled 2018-09-06: qty 250

## 2018-09-06 NOTE — Patient Instructions (Signed)
[facility] Discharge Instructions for Patients Receiving Chemotherapy  Today you received the following chemotherapy agents: Imfinzi.  To help prevent nausea and vomiting after your treatment, we encourage you to take your nausea medication as directed.   If you develop nausea and vomiting that is not controlled by your nausea medication, call the clinic.   BELOW ARE SYMPTOMS THAT SHOULD BE REPORTED IMMEDIATELY:  *FEVER GREATER THAN 100.5 F  *CHILLS WITH OR WITHOUT FEVER  NAUSEA AND VOMITING THAT IS NOT CONTROLLED WITH YOUR NAUSEA MEDICATION  *UNUSUAL SHORTNESS OF BREATH  *UNUSUAL BRUISING OR BLEEDING  TENDERNESS IN MOUTH AND THROAT WITH OR WITHOUT PRESENCE OF ULCERS  *URINARY PROBLEMS  *BOWEL PROBLEMS  UNUSUAL RASH Items with * indicate a potential emergency and should be followed up as soon as possible.  Feel free to call the clinic should you have any questions or concerns. The clinic phone number is [phone].  Please show the CHEMO ALERT CARD at check-in to the Emergency Department and triage nurse.   

## 2018-09-06 NOTE — Progress Notes (Signed)
[facility] OFFICE PROGRESS NOTE  Default, Provider, MD No address on file  DIAGNOSIS: Stage IIIA (T3,N1/N2, M0)non-small cell lung cancer, adenocarcinoma presented with large right lower lobe lung mass with extension to the right hilum and subcarinal area diagnosed in July 2019.  Biomarker Findings Tumor Mutational Burden - TMB-Intermediate (6 Muts/Mb) Microsatellite status - MS-Stable Genomic Findings For a complete list of the genes assayed, please refer to the Appendix. NRAS Q61R ARAF amplification STK11 G56W KRAS G13D MYCN amplification MCL1 amplification NKX2-1 amplification - equivocal? TP53 G245V 7 Disease relevant genes with no reportable alterations: EGFR, ALK, BRAF, MET, ERBB2, RET, ROS1   PRIOR THERAPY: Course of concurrent chemoradiation with weekly carboplatin for AUC of 2 and paclitaxel 45 mg/M2. Status post 7 cycles. Last dose was giving 01/11/2018.  CURRENT THERAPY: Consolidation treatment with immunotherapy with Imfinzi (Durvalumab) 10 mg/KG every 2 weeks. First dose February 09, 2018. Status post 15cycles.  INTERVAL HISTORY: Susan Holder 63 y.o. female returns to the clinic for a follow-up visit.  The patient is feeling well today without any concerning complaints except for continued fatigue, myalgias, and arthralgias that are exacerbated by treatment.  She takes Aleve with some relief of her symptoms.  Otherwise she continues to tolerate her treatment fairly well without any other adverse effects.  She denies any fever, chills, night sweats, or weight loss.  She denies any chest pain, shortness of breath, cough, or hemoptysis.  She denies any nausea, vomiting, diarrhea, or constipation.  She denies any headache or visual changes.  She denies any rashes or skin changes except for some dry skin.  She is here today for evaluation prior to starting cycle #16.  MEDICAL HISTORY: Past Medical History:  Diagnosis Date  . Rheumatoid arthritis  ([place])     ALLERGIES:  has No Known Allergies.  MEDICATIONS:  Current Outpatient Medications  Medication Sig Dispense Refill  . aspirin EC 81 MG tablet Take 81 mg by mouth daily.    [name] (IMFINZI IV) Inject into the vein every 14 (fourteen) days.    . famotidine (PEPCID) 40 MG tablet Take 40 mg by mouth as needed.     . hydroxychloroquine (PLAQUENIL) 200 MG tablet TAKE 1 TABLET BY MOUTH TWICE DAILY MONDAY THROUGH FRIDAY 120 tablet 0  . naproxen sodium (ALEVE) 220 MG tablet Take 220 mg by mouth daily as needed (PAIN).    Marland Kitchen Phenylephrine-DM-GG-APAP (DELSYM COUGH/COLD DAYTIME PO) Take by mouth as needed.     . pseudoephedrine-guaifenesin (MUCINEX D) 60-600 MG 12 hr tablet Take 1 tablet by mouth as needed.     Marland Kitchen VITAMIN D PO Take by mouth.     No current facility-administered medications for this visit.     SURGICAL HISTORY:  Past Surgical History:  Procedure Laterality Date  . BACK SURGERY    . BRONCHIAL NEEDLE ASPIRATION BIOPSY  11/09/2017   Procedure: BRONCHIAL NEEDLE ASPIRATION BIOPSIES;  Surgeon: Marshell Garfinkel, MD;  Location: [facility];  Service: Cardiopulmonary;;  . ENDOBRONCHIAL ULTRASOUND Bilateral 11/09/2017   Procedure: ENDOBRONCHIAL ULTRASOUND;  Surgeon: Marshell Garfinkel, MD;  Location: [facility];  Service: Cardiopulmonary;  Laterality: Bilateral;  . NASAL SINUS SURGERY    . NASAL SINUS SURGERY    . TUBAL LIGATION    . VIDEO BRONCHOSCOPY  11/09/2017   Procedure: VIDEO BRONCHOSCOPY;  Surgeon: Marshell Garfinkel, MD;  Location: [facility];  Service: Cardiopulmonary;;    REVIEW OF SYSTEMS:   Review of Systems  Constitutional: Positive for fatigue. Negative for appetite  change, chills, fever and unexpected weight change.  HENT: Negative for mouth sores, nosebleeds, sore throat and trouble swallowing.   Eyes: Negative for eye problems and icterus.  Respiratory: Negative for cough, hemoptysis, shortness of breath and wheezing.   Cardiovascular: Negative for chest  pain and leg swelling.  Gastrointestinal: Negative for abdominal pain, constipation, diarrhea, nausea and vomiting.  Genitourinary: Negative for bladder incontinence, difficulty urinating, dysuria, frequency and hematuria.   Musculoskeletal: Positive for myalgias and arthralgias in her elbows, shoulders, ankles, and knees. Negative for back pain, gait problem, neck pain and neck stiffness.  Skin: Negative for itching and rash.  Neurological: Negative for dizziness, extremity weakness, gait problem, headaches, light-headedness and seizures.  Hematological: Negative for adenopathy. Does not bruise/bleed easily.  Psychiatric/Behavioral: Negative for confusion, depression and sleep disturbance. The patient is not nervous/anxious.     PHYSICAL EXAMINATION:  Blood pressure 123/72, pulse (!) 114, temperature 98.3 F (36.8 C), temperature source Oral, resp. rate 18, height 5' 1"  (1.549 m), weight 123 lb 9.6 oz (56.1 kg), last menstrual period 04/28/2000, SpO2 100 %.  ECOG PERFORMANCE STATUS: 1 - Symptomatic but completely ambulatory  Physical Exam  Constitutional: Oriented to person, place, and time and well-developed, well-nourished, and in no distress. HENT:  Head: Normocephalic and atraumatic.  Mouth/Throat: Oropharynx is clear and moist. No oropharyngeal exudate.  Eyes: Conjunctivae are normal. Right eye exhibits no discharge. Left eye exhibits no discharge. No scleral icterus.  Neck: Normal range of motion. Neck supple.  Cardiovascular: Normal rate, regular rhythm, normal heart sounds and intact distal pulses.   Pulmonary/Chest: Effort normal and breath sounds normal. No respiratory distress. No wheezes. No rales.  Abdominal: Soft. Bowel sounds are normal. Exhibits no distension and no mass. There is no tenderness.  Musculoskeletal: Normal range of motion. Exhibits no edema.  Lymphadenopathy:    No cervical adenopathy.  Neurological: Alert and oriented to person, place, and time. Exhibits  normal muscle tone. Gait normal. Coordination normal.  Skin: Skin is warm and dry. No rash noted. Not diaphoretic. No erythema. No pallor.  Psychiatric: Mood, memory and judgment normal.  Vitals reviewed.  LABORATORY DATA: Lab Results  Component Value Date   WBC 7.0 09/06/2018   HGB 11.1 (L) 09/06/2018   HCT 35.5 (L) 09/06/2018   MCV 93.2 09/06/2018   PLT 369 09/06/2018      Chemistry      Component Value Date/Time   NA 140 09/06/2018 1357   K 3.9 09/06/2018 1357   CL 105 09/06/2018 1357   CO2 25 09/06/2018 1357   BUN 18 09/06/2018 1357   CREATININE 1.01 (H) 09/06/2018 1357   CREATININE 0.80 07/24/2017 1438      Component Value Date/Time   CALCIUM 9.7 09/06/2018 1357   ALKPHOS 117 09/06/2018 1357   AST 23 09/06/2018 1357   ALT 19 09/06/2018 1357   BILITOT <0.2 (L) 09/06/2018 1357       RADIOGRAPHIC STUDIES:  No results found.   ASSESSMENT/PLAN:  This is a very pleasant 63 year old African-American female diagnosed with stage IIIa non-small cell lung cancer, adenocarcinoma.  She presented with a large right lower lobe lung mass with extension into the right hilum and subcarinal area.  She was diagnosed in July 2019.  She underwent a course of concurrent chemoradiation with weekly carboplatin and paclitaxel.  She is status post 7 cycles with a partial response. The patient tolerated this treatment well except for mild odynophagia and dysphagia.  She is currently undergoing consolidation immunotherapy with Imfinzi  10 mg/kg IV every 2 weeks.  She is status post 15 cycles.  She is tolerating her treatment fairly well except for fatigue, arthralgias, and myalgias.   She was seen with Dr. Julien Nordmann today.  Labs were reviewed with the patient.  We recommend that she proceed with cycle #16 today as scheduled. We will see her back for follow-up visit in 2 weeks for evaluation before starting cycle #17. The patient was advised to call immediately if she has any concerning  symptoms in the interval. The patient voices understanding of current disease status and treatment options and is in agreement with the current care plan. All questions were answered. The patient knows to call the clinic with any problems, questions or concerns. We can certainly see the patient much sooner if necessary  No orders of the defined types were placed in this encounter.    Susan Coiro L Sabien Umland, PA-C 09/06/18  ADDENDUM: Hematology/Oncology Attending: I had a face-to-face encounter with the patient today.  I recommended her care plan.  This is a very pleasant 63 years old African-American female who was cancer, adenocarcinoma status post concurrent chemoradiation with partial response and she is currently undergoing consolidation treatment with immunotherapy with Imfinzi status post 15 cycles.  The patient has been tolerating this treatment well with no concerning adverse effects except for arthralgia. I recommended for her to proceed with cycle #16 today as scheduled. I will see her back for follow-up visit in 2 weeks for evaluation before the next cycle of her treatment. She was advised to call immediately if she has any concerning symptoms in the interval.  Disclaimer: This note was dictated with voice recognition software. Similar sounding words can inadvertently be transcribed and may be missed upon review. Eilleen Kempf, MD 09/06/18

## 2018-09-07 ENCOUNTER — Telehealth: Payer: Self-pay | Admitting: Physician Assistant

## 2018-09-07 NOTE — Telephone Encounter (Signed)
Scheduled additional appts per 5/11 los - pt to get an updated schedule next visit.

## 2018-09-21 ENCOUNTER — Inpatient Hospital Stay: Payer: BC Managed Care – PPO | Admitting: Physician Assistant

## 2018-09-21 ENCOUNTER — Encounter: Payer: Self-pay | Admitting: Physician Assistant

## 2018-09-21 ENCOUNTER — Inpatient Hospital Stay: Payer: BC Managed Care – PPO

## 2018-09-21 ENCOUNTER — Other Ambulatory Visit: Payer: Self-pay

## 2018-09-21 VITALS — BP 143/68 | HR 93 | Temp 97.8°F | Resp 18 | Ht 61.0 in | Wt 125.5 lb

## 2018-09-21 DIAGNOSIS — Z79899 Other long term (current) drug therapy: Secondary | ICD-10-CM

## 2018-09-21 DIAGNOSIS — M069 Rheumatoid arthritis, unspecified: Secondary | ICD-10-CM | POA: Diagnosis not present

## 2018-09-21 DIAGNOSIS — Z7982 Long term (current) use of aspirin: Secondary | ICD-10-CM | POA: Diagnosis not present

## 2018-09-21 DIAGNOSIS — C3491 Malignant neoplasm of unspecified part of right bronchus or lung: Secondary | ICD-10-CM

## 2018-09-21 DIAGNOSIS — C3431 Malignant neoplasm of lower lobe, right bronchus or lung: Secondary | ICD-10-CM

## 2018-09-21 DIAGNOSIS — K219 Gastro-esophageal reflux disease without esophagitis: Secondary | ICD-10-CM

## 2018-09-21 DIAGNOSIS — Z5112 Encounter for antineoplastic immunotherapy: Secondary | ICD-10-CM

## 2018-09-21 DIAGNOSIS — R1310 Dysphagia, unspecified: Secondary | ICD-10-CM

## 2018-09-21 HISTORY — DX: Gastro-esophageal reflux disease without esophagitis: K21.9

## 2018-09-21 LAB — CBC WITH DIFFERENTIAL (CANCER CENTER ONLY)
Abs Immature Granulocytes: 0.02 10*3/uL (ref 0.00–0.07)
Basophils Absolute: 0 10*3/uL (ref 0.0–0.1)
Basophils Relative: 0 %
Eosinophils Absolute: 0.2 10*3/uL (ref 0.0–0.5)
Eosinophils Relative: 2 %
HCT: 35.9 % — ABNORMAL LOW (ref 36.0–46.0)
Hemoglobin: 11 g/dL — ABNORMAL LOW (ref 12.0–15.0)
Immature Granulocytes: 0 %
Lymphocytes Relative: 19 %
Lymphs Abs: 1.4 10*3/uL (ref 0.7–4.0)
MCH: 29.3 pg (ref 26.0–34.0)
MCHC: 30.6 g/dL (ref 30.0–36.0)
MCV: 95.5 fL (ref 80.0–100.0)
Monocytes Absolute: 0.9 10*3/uL (ref 0.1–1.0)
Monocytes Relative: 12 %
Neutro Abs: 4.8 10*3/uL (ref 1.7–7.7)
Neutrophils Relative %: 67 %
Platelet Count: 353 10*3/uL (ref 150–400)
RBC: 3.76 MIL/uL — ABNORMAL LOW (ref 3.87–5.11)
RDW: 14.8 % (ref 11.5–15.5)
WBC Count: 7.2 10*3/uL (ref 4.0–10.5)
nRBC: 0 % (ref 0.0–0.2)

## 2018-09-21 LAB — CMP (CANCER CENTER ONLY)
ALT: 32 U/L (ref 0–44)
AST: 36 U/L (ref 15–41)
Albumin: 3.3 g/dL — ABNORMAL LOW (ref 3.5–5.0)
Alkaline Phosphatase: 121 U/L (ref 38–126)
Anion gap: 8 (ref 5–15)
BUN: 17 mg/dL (ref 8–23)
CO2: 25 mmol/L (ref 22–32)
Calcium: 9.8 mg/dL (ref 8.9–10.3)
Chloride: 105 mmol/L (ref 98–111)
Creatinine: 0.77 mg/dL (ref 0.44–1.00)
GFR, Est AFR Am: >60 mL/min (ref >60)
GFR, Estimated: >60 mL/min (ref >60)
Glucose, Bld: 86 mg/dL (ref 70–99)
Potassium: 4.5 mmol/L (ref 3.5–5.1)
Sodium: 138 mmol/L (ref 135–145)
Total Bilirubin: <0.2 mg/dL — ABNORMAL LOW (ref 0.3–1.2)
Total Protein: 7.9 g/dL (ref 6.5–8.1)

## 2018-09-21 LAB — TSH: TSH: 1.895 u[IU]/mL (ref 0.308–3.960)

## 2018-09-21 MED ORDER — SODIUM CHLORIDE 0.9 % IV SOLN
Freq: Once | INTRAVENOUS | Status: AC
  Administered 2018-09-21: 10:00:00 via INTRAVENOUS
  Filled 2018-09-21: qty 250

## 2018-09-21 MED ORDER — SODIUM CHLORIDE 0.9 % IV SOLN
9.6000 mg/kg | Freq: Once | INTRAVENOUS | Status: AC
  Administered 2018-09-21: 500 mg via INTRAVENOUS
  Filled 2018-09-21: qty 10

## 2018-09-21 NOTE — Progress Notes (Signed)
[facility] OFFICE PROGRESS NOTE  Default, Provider, MD No address on file  DIAGNOSIS: Stage IIIA (T3,N1/N2, M0)non-small cell lung cancer, adenocarcinoma presented with large right lower lobe lung mass with extension to the right hilum and subcarinal area diagnosed in July 2019.  Biomarker Findings Tumor Mutational Burden - TMB-Intermediate (6 Muts/Mb) Microsatellite status - MS-Stable Genomic Findings For a complete list of the genes assayed, please refer to the Appendix. NRAS Q61R ARAF amplification STK11 G56W KRAS G13D MYCN amplification MCL1 amplification NKX2-1 amplification - equivocal? TP53 G245V 7 Disease relevant genes with no reportable alterations: EGFR, ALK, BRAF, MET, ERBB2, RET, ROS1  PRIOR THERAPY: Course of concurrent chemoradiation with weekly carboplatin for AUC of 2 and paclitaxel 45 mg/M2. Status post 7 cycles. Last dose was giving 01/11/2018.  CURRENT THERAPY: Consolidation treatment with immunotherapy with Imfinzi (Durvalumab) 10 mg/KG every 2 weeks. First dose February 09, 2018. Status post 16cycles.  INTERVAL HISTORY: Susan Holder 63 y.o. female returns to the clinic for a follow-up visit.  The patient is feeling fairly well today without any concerning complaints except for some fatigue, myalgias, and arthralgias secondary to her treatment with immunotherapy.  She takes Aleve with some relief of her symptoms.  She also expresses concern for acid reflux which has been present since receiving radiation treament. She normally takes extra strenght Pepcid to alleviate her symptoms. Due to the COVID pandemic, she has been unable to find Pepcid. Her symptoms are exacerbated by eating certain foods as well as lying down. She has an associated dry cough, particularly when lying down. Otherwise she is feeling well today and denies any fever, chills, night sweats, or weight loss.  She denies any chest pain, shortness of breath, cough, or  hemoptysis.  She denies any nausea, vomiting, diarrhea, or constipation.  She denies any headache or visual changes.  She denies any rashes or skin changes.  She is here today for evaluation before starting cycle #17.  MEDICAL HISTORY: Past Medical History:  Diagnosis Date  . Rheumatoid arthritis ([place])     ALLERGIES:  has No Known Allergies.  MEDICATIONS:  Current Outpatient Medications  Medication Sig Dispense Refill  . aspirin EC 81 MG tablet Take 81 mg by mouth daily.    [name] (IMFINZI IV) Inject into the vein every 14 (fourteen) days.    . famotidine (PEPCID) 40 MG tablet Take 40 mg by mouth as needed.     . hydroxychloroquine (PLAQUENIL) 200 MG tablet TAKE 1 TABLET BY MOUTH TWICE DAILY MONDAY THROUGH FRIDAY 120 tablet 0  . naproxen sodium (ALEVE) 220 MG tablet Take 220 mg by mouth daily as needed (PAIN).    Marland Kitchen Phenylephrine-DM-GG-APAP (DELSYM COUGH/COLD DAYTIME PO) Take by mouth as needed.     Marland Kitchen VITAMIN D PO Take by mouth.    . pseudoephedrine-guaifenesin (MUCINEX D) 60-600 MG 12 hr tablet Take 1 tablet by mouth as needed.      No current facility-administered medications for this visit.     SURGICAL HISTORY:  Past Surgical History:  Procedure Laterality Date  . BACK SURGERY    . BRONCHIAL NEEDLE ASPIRATION BIOPSY  11/09/2017   Procedure: BRONCHIAL NEEDLE ASPIRATION BIOPSIES;  Surgeon: Marshell Garfinkel, MD;  Location: [facility];  Service: Cardiopulmonary;;  . ENDOBRONCHIAL ULTRASOUND Bilateral 11/09/2017   Procedure: ENDOBRONCHIAL ULTRASOUND;  Surgeon: Marshell Garfinkel, MD;  Location: [facility];  Service: Cardiopulmonary;  Laterality: Bilateral;  . NASAL SINUS SURGERY    . NASAL SINUS SURGERY    .  TUBAL LIGATION    . VIDEO BRONCHOSCOPY  11/09/2017   Procedure: VIDEO BRONCHOSCOPY;  Surgeon: Marshell Garfinkel, MD;  Location: [facility];  Service: Cardiopulmonary;;    REVIEW OF SYSTEMS:   Review of Systems  Constitutional: Positive for fatigue. Negative for appetite  change, chills, fever and unexpected weight change.  HENT:   Negative for mouth sores, nosebleeds, sore throat and trouble swallowing.   Eyes: Negative for eye problems and icterus.  Respiratory: Positive for dry cough. Negative for hemoptysis, shortness of breath and wheezing.   Cardiovascular: Negative for chest pain and leg swelling.  Gastrointestinal: Positive for heart burn. Negative for abdominal pain, constipation, diarrhea, nausea and vomiting.  Genitourinary: Negative for bladder incontinence, difficulty urinating, dysuria, frequency and hematuria.   Musculoskeletal: Positive for myalgias/arthralgias in her shoulder, elbows, knees, and ankles. Negative for back pain, gait problem, neck pain and neck stiffness.  Skin: Negative for itching and rash.  Neurological: Negative for dizziness, extremity weakness, gait problem, headaches, light-headedness and seizures.  Hematological: Negative for adenopathy. Does not bruise/bleed easily.  Psychiatric/Behavioral: Negative for confusion, depression and sleep disturbance. The patient is not nervous/anxious.     PHYSICAL EXAMINATION:  Blood pressure (!) 143/68, pulse 93, temperature 97.8 F (36.6 C), temperature source Oral, resp. rate 18, height 5' 1"  (1.549 m), weight 125 lb 8 oz (56.9 kg), last menstrual period 04/28/2000, SpO2 100 %.  ECOG PERFORMANCE STATUS: 1 - Symptomatic but completely ambulatory  Physical Exam  Constitutional: Oriented to person, place, and time and well-developed, well-nourished, and in no distress. Marland Kitchen  HENT:  Head: Normocephalic and atraumatic.  Mouth/Throat: Oropharynx is clear and moist. No oropharyngeal exudate.  Eyes: Conjunctivae are normal. Right eye exhibits no discharge. Left eye exhibits no discharge. No scleral icterus.  Neck: Normal range of motion. Neck supple.  Cardiovascular: Normal rate, regular rhythm, normal heart sounds and intact distal pulses.   Pulmonary/Chest: Effort normal and breath sounds  normal. No respiratory distress. No wheezes. No rales.  Abdominal: Soft. Bowel sounds are normal. Exhibits no distension and no mass. There is no tenderness.  Musculoskeletal: Normal range of motion. Exhibits no edema.  Lymphadenopathy:    No cervical adenopathy.  Neurological: Alert and oriented to person, place, and time. Exhibits normal muscle tone. Gait normal. Coordination normal.  Skin: Skin is warm and dry. No rash noted. Not diaphoretic. No erythema. No pallor.  Psychiatric: Mood, memory and judgment normal.  Vitals reviewed.  LABORATORY DATA: Lab Results  Component Value Date   WBC 7.2 09/21/2018   HGB 11.0 (L) 09/21/2018   HCT 35.9 (L) 09/21/2018   MCV 95.5 09/21/2018   PLT 353 09/21/2018      Chemistry      Component Value Date/Time   NA 138 09/21/2018 0807   K 4.5 09/21/2018 0807   CL 105 09/21/2018 0807   CO2 25 09/21/2018 0807   BUN 17 09/21/2018 0807   CREATININE 0.77 09/21/2018 0807   CREATININE 0.80 07/24/2017 1438      Component Value Date/Time   CALCIUM 9.8 09/21/2018 0807   ALKPHOS 121 09/21/2018 0807   AST 36 09/21/2018 0807   ALT 32 09/21/2018 0807   BILITOT <0.2 (L) 09/21/2018 0807       RADIOGRAPHIC STUDIES:  No results found.   ASSESSMENT/PLAN:  This is a very pleasant 63 year old African-American female diagnosed with stage IIIa non-small cell lung cancer, adenocarcinoma.  She presented with a large right lower lobe lung mass with extension into the right hilum and  subcarinal area.  She was diagnosed in July 2019.  She underwent a course of concurrent chemoradiation with weekly carboplatin and paclitaxel.  She is status post 7 cycles with a partial response.  The patient tolerated this treatment well except for mild odynophagia and dysphagia secondary to radiation treatment.  She is currently undergoing consolidation immunotherapy with Imfinzi 10 mg/kg IV every 2 weeks.  She is status post 16 cycles.  She has been tolerating treatment  fairly well except for some fatigue, arthralgias, and myalgias.  The patient was seen with Dr. Julien Nordmann today.  Labs were reviewed with the patient.  We recommend that she proceed with cycle #17 today as scheduled.  I will see her back for follow-up visit in 2 weeks for evaluation before starting cycle #18.  Regarding the patient's heartburn, the patient was instructed to try a trial with over-the-counter omeprazole or another OTC reflux medication.   She will continue taking Aleve as needed for her myalgias and arthralgias.  The patient was advised to call immediately if she has any concerning symptoms in the interval. The patient voices understanding of current disease status and treatment options and is in agreement with the current care plan. All questions were answered. The patient knows to call the clinic with any problems, questions or concerns. We can certainly see the patient much sooner if necessary  No orders of the defined types were placed in this encounter.    Cassandra L Heilingoetter, PA-C 09/21/18  ADDENDUM: Hematology/Oncology Attending: I had a face-to-face encounter with the patient today.  I recommended her care plan.  This is a very pleasant 63 years old African-American female with a stage IIIa non-small cell lung cancer, adenocarcinoma status post induction concurrent chemoradiation with weekly carboplatin and paclitaxel with partial response. The patient is currently undergoing consolidation immunotherapy with Imfinzi every 2 weeks status post 16 cycles.  She has been tolerating this treatment fairly well with no concerning adverse effect except for arthralgia. I recommended for the patient to proceed with cycle #17 today. She will come back for follow-up visit in 2 weeks for evaluation before starting cycle #18. For the arthralgia she was advised to take Tylenol extra strength. For the acid reflux symptoms, she was advised to use over-the-counter PPI. The patient  was advised to call immediately if she has any other concerning symptoms in the interval.  Disclaimer: This note was dictated with voice recognition software. Similar sounding words can inadvertently be transcribed and may be missed upon review. Eilleen Kempf, MD 09/21/18

## 2018-09-21 NOTE — Patient Instructions (Signed)
[facility] Discharge Instructions for Patients Receiving Chemotherapy  Today you received the following chemotherapy agents:  Imfinzi (durvalumab)  To help prevent nausea and vomiting after your treatment, we encourage you to take your nausea medication as prescribed.   If you develop nausea and vomiting that is not controlled by your nausea medication, call the clinic.   BELOW ARE SYMPTOMS THAT SHOULD BE REPORTED IMMEDIATELY:  *FEVER GREATER THAN 100.5 F  *CHILLS WITH OR WITHOUT FEVER  NAUSEA AND VOMITING THAT IS NOT CONTROLLED WITH YOUR NAUSEA MEDICATION  *UNUSUAL SHORTNESS OF BREATH  *UNUSUAL BRUISING OR BLEEDING  TENDERNESS IN MOUTH AND THROAT WITH OR WITHOUT PRESENCE OF ULCERS  *URINARY PROBLEMS  *BOWEL PROBLEMS  UNUSUAL RASH Items with * indicate a potential emergency and should be followed up as soon as possible.  Feel free to call the clinic should you have any questions or concerns. The clinic phone number is [phone].  Please show the [facility] at check-in to the Emergency Department and triage nurse.

## 2018-10-05 ENCOUNTER — Inpatient Hospital Stay: Payer: Medicare Other | Admitting: Physician Assistant

## 2018-10-05 ENCOUNTER — Inpatient Hospital Stay: Payer: Medicare Other

## 2018-10-05 ENCOUNTER — Other Ambulatory Visit: Payer: Self-pay

## 2018-10-05 ENCOUNTER — Inpatient Hospital Stay: Payer: Medicare Other | Attending: Internal Medicine

## 2018-10-05 VITALS — HR 96

## 2018-10-05 VITALS — BP 125/67 | HR 107 | Temp 98.9°F | Resp 18 | Ht 61.0 in | Wt 125.2 lb

## 2018-10-05 DIAGNOSIS — M069 Rheumatoid arthritis, unspecified: Secondary | ICD-10-CM | POA: Insufficient documentation

## 2018-10-05 DIAGNOSIS — R1310 Dysphagia, unspecified: Secondary | ICD-10-CM | POA: Diagnosis not present

## 2018-10-05 DIAGNOSIS — C3491 Malignant neoplasm of unspecified part of right bronchus or lung: Secondary | ICD-10-CM

## 2018-10-05 DIAGNOSIS — C3431 Malignant neoplasm of lower lobe, right bronchus or lung: Secondary | ICD-10-CM | POA: Insufficient documentation

## 2018-10-05 DIAGNOSIS — Z5112 Encounter for antineoplastic immunotherapy: Secondary | ICD-10-CM | POA: Insufficient documentation

## 2018-10-05 DIAGNOSIS — R590 Localized enlarged lymph nodes: Secondary | ICD-10-CM | POA: Diagnosis not present

## 2018-10-05 DIAGNOSIS — Z79899 Other long term (current) drug therapy: Secondary | ICD-10-CM | POA: Insufficient documentation

## 2018-10-05 LAB — CMP (CANCER CENTER ONLY)
ALT: 18 U/L (ref 0–44)
AST: 23 U/L (ref 15–41)
Albumin: 3.3 g/dL — ABNORMAL LOW (ref 3.5–5.0)
Alkaline Phosphatase: 109 U/L (ref 38–126)
Anion gap: 11 (ref 5–15)
BUN: 17 mg/dL (ref 8–23)
CO2: 22 mmol/L (ref 22–32)
Calcium: 9.7 mg/dL (ref 8.9–10.3)
Chloride: 105 mmol/L (ref 98–111)
Creatinine: 0.89 mg/dL (ref 0.44–1.00)
GFR, Est AFR Am: >60 mL/min (ref >60)
GFR, Estimated: >60 mL/min (ref >60)
Glucose, Bld: 92 mg/dL (ref 70–99)
Potassium: 4.1 mmol/L (ref 3.5–5.1)
Sodium: 138 mmol/L (ref 135–145)
Total Bilirubin: <0.2 mg/dL — ABNORMAL LOW (ref 0.3–1.2)
Total Protein: 7.7 g/dL (ref 6.5–8.1)

## 2018-10-05 LAB — CBC WITH DIFFERENTIAL (CANCER CENTER ONLY)
Abs Immature Granulocytes: 0.02 10*3/uL (ref 0.00–0.07)
Basophils Absolute: 0 10*3/uL (ref 0.0–0.1)
Basophils Relative: 1 %
Eosinophils Absolute: 0.2 10*3/uL (ref 0.0–0.5)
Eosinophils Relative: 3 %
HCT: 35.7 % — ABNORMAL LOW (ref 36.0–46.0)
Hemoglobin: 11 g/dL — ABNORMAL LOW (ref 12.0–15.0)
Immature Granulocytes: 0 %
Lymphocytes Relative: 25 %
Lymphs Abs: 1.8 10*3/uL (ref 0.7–4.0)
MCH: 28.6 pg (ref 26.0–34.0)
MCHC: 30.8 g/dL (ref 30.0–36.0)
MCV: 92.7 fL (ref 80.0–100.0)
Monocytes Absolute: 0.9 10*3/uL (ref 0.1–1.0)
Monocytes Relative: 12 %
Neutro Abs: 4.2 10*3/uL (ref 1.7–7.7)
Neutrophils Relative %: 59 %
Platelet Count: 357 10*3/uL (ref 150–400)
RBC: 3.85 MIL/uL — ABNORMAL LOW (ref 3.87–5.11)
RDW: 15.1 % (ref 11.5–15.5)
WBC Count: 7.2 10*3/uL (ref 4.0–10.5)
nRBC: 0 % (ref 0.0–0.2)

## 2018-10-05 MED ORDER — SODIUM CHLORIDE 0.9 % IV SOLN
10.9000 mg/kg | Freq: Once | INTRAVENOUS | Status: AC
  Administered 2018-10-05: 620 mg via INTRAVENOUS
  Filled 2018-10-05: qty 10

## 2018-10-05 MED ORDER — SODIUM CHLORIDE 0.9 % IV SOLN
Freq: Once | INTRAVENOUS | Status: AC
  Administered 2018-10-05: 14:00:00 via INTRAVENOUS
  Filled 2018-10-05: qty 250

## 2018-10-05 MED ORDER — SODIUM CHLORIDE 0.9 % IV SOLN: 10.0000 mg/kg | Freq: Once | INTRAVENOUS | Status: DC

## 2018-10-05 NOTE — Progress Notes (Signed)
Increase durvalumab dose for weight gain per Dr. Julien Nordmann.   Demetrius Charity, PharmD, [facility] Pharmacist Pharmacy Phone: [phone] 10/05/2018

## 2018-10-05 NOTE — Patient Instructions (Signed)
[facility] Discharge Instructions for Patients Receiving Chemotherapy  Today you received the following chemotherapy agents:  Imfinzi (durvalumab)  To help prevent nausea and vomiting after your treatment, we encourage you to take your nausea medication as prescribed.   If you develop nausea and vomiting that is not controlled by your nausea medication, call the clinic.   BELOW ARE SYMPTOMS THAT SHOULD BE REPORTED IMMEDIATELY:  *FEVER GREATER THAN 100.5 F  *CHILLS WITH OR WITHOUT FEVER  NAUSEA AND VOMITING THAT IS NOT CONTROLLED WITH YOUR NAUSEA MEDICATION  *UNUSUAL SHORTNESS OF BREATH  *UNUSUAL BRUISING OR BLEEDING  TENDERNESS IN MOUTH AND THROAT WITH OR WITHOUT PRESENCE OF ULCERS  *URINARY PROBLEMS  *BOWEL PROBLEMS  UNUSUAL RASH Items with * indicate a potential emergency and should be followed up as soon as possible.  Feel free to call the clinic should you have any questions or concerns. The clinic phone number is [phone].  Please show the [facility] at check-in to the Emergency Department and triage nurse.

## 2018-10-05 NOTE — Progress Notes (Signed)
[facility] OFFICE PROGRESS NOTE  Default, Provider, MD No address on file  DIAGNOSIS: Stage IIIA (T3,N1/N2, M0)non-small cell lung cancer, adenocarcinoma presented with large right lower lobe lung mass with extension to the right hilum and subcarinal area diagnosed in July 2019.  Biomarker Findings Tumor Mutational Burden - TMB-Intermediate (6 Muts/Mb) Microsatellite status - MS-Stable Genomic Findings For a complete list of the genes assayed, please refer to the Appendix. NRAS Q61R ARAF amplification STK11 G56W KRAS G13D MYCN amplification MCL1 amplification NKX2-1 amplification - equivocal? TP53 G245V 7 Disease relevant genes with no reportable alterations: EGFR, ALK, BRAF, MET, ERBB2, RET, ROS1  PRIOR THERAPY: Course of concurrent chemoradiation with weekly carboplatin for AUC of 2 and paclitaxel 45 mg/M2. Status post 7 cycles. Last dose was giving 01/11/2018.  CURRENT THERAPY: Consolidation treatment with immunotherapy with Imfinzi (Durvalumab) 10 mg/KG every 2 weeks. First dose February 09, 2018. Status post 17cycles.  INTERVAL HISTORY: Susan Holder 63 y.o. female returns to the clinic for a follow-up visit.  The patient is feeling fairly well today without any concerning complaints except for some arthralgias secondary to her immunotherapy treatment.  She recently picked up Voltaren for her joint pain which she states has been helping her arthralgias.  She also recently started taking over-the-counter omeprazole for her reflux which has helped alleviate her symptoms.  She denies any fever, chills, night sweats, or weight loss.  She denies any chest pain, shortness of breath, or hemoptysis.  She endorses a baseline dry cough.  She denies any nausea, vomiting, diarrhea, or constipation.  She denies any headache or visual changes.  She denies any rashes or skin changes.  She is here today for evaluation before starting cycle #18.  MEDICAL HISTORY: Past  Medical History:  Diagnosis Date  . Rheumatoid arthritis ([place])     ALLERGIES:  has No Known Allergies.  MEDICATIONS:  Current Outpatient Medications  Medication Sig Dispense Refill  . aspirin EC 81 MG tablet Take 81 mg by mouth daily.    . diclofenac sodium (VOLTAREN) 1 % GEL Apply 2 g topically 4 (four) times daily.    [name] (IMFINZI IV) Inject into the vein every 14 (fourteen) days.    . hydroxychloroquine (PLAQUENIL) 200 MG tablet TAKE 1 TABLET BY MOUTH TWICE DAILY MONDAY THROUGH FRIDAY 120 tablet 0  . naproxen sodium (ALEVE) 220 MG tablet Take 220 mg by mouth daily as needed (PAIN).    Marland Kitchen omeprazole (PRILOSEC) 20 MG capsule Take 20 mg by mouth daily.    Marland Kitchen Phenylephrine-DM-GG-APAP (DELSYM COUGH/COLD DAYTIME PO) Take by mouth as needed.     . pseudoephedrine-guaifenesin (MUCINEX D) 60-600 MG 12 hr tablet Take 1 tablet by mouth as needed.     Marland Kitchen VITAMIN D PO Take by mouth.     No current facility-administered medications for this visit.    Facility-Administered Medications Ordered in Other Visits  Medication Dose Route Frequency Provider Last Rate Last Dose  . durvalumab (IMFINZI) 520 mg in sodium chloride 0.9 % 100 mL chemo infusion  10 mg/kg (Treatment Plan Recorded) Intravenous Once Curt Bears, MD        SURGICAL HISTORY:  Past Surgical History:  Procedure Laterality Date  . BACK SURGERY    . BRONCHIAL NEEDLE ASPIRATION BIOPSY  11/09/2017   Procedure: BRONCHIAL NEEDLE ASPIRATION BIOPSIES;  Surgeon: Marshell Garfinkel, MD;  Location: [facility];  Service: Cardiopulmonary;;  . ENDOBRONCHIAL ULTRASOUND Bilateral 11/09/2017   Procedure: ENDOBRONCHIAL ULTRASOUND;  Surgeon: Marshell Garfinkel, MD;  Location: [facility];  Service: Cardiopulmonary;  Laterality: Bilateral;  . NASAL SINUS SURGERY    . NASAL SINUS SURGERY    . TUBAL LIGATION    . VIDEO BRONCHOSCOPY  11/09/2017   Procedure: VIDEO BRONCHOSCOPY;  Surgeon: Marshell Garfinkel, MD;  Location: [facility];  Service:  Cardiopulmonary;;    REVIEW OF SYSTEMS:   Review of Systems  Constitutional: Positive for fatigue. Negative for appetite change, chills, fever and unexpected weight change.  HENT:   Negative for mouth sores, nosebleeds, sore throat and trouble swallowing.   Eyes: Negative for eye problems and icterus.  Respiratory: Positive for baseline mild dry cough. Negative for hemoptysis, shortness of breath and wheezing.   Cardiovascular: Negative for chest pain and leg swelling.  Gastrointestinal: Negative for abdominal pain, constipation, diarrhea, nausea and vomiting.  Genitourinary: Negative for bladder incontinence, difficulty urinating, dysuria, frequency and hematuria.   Musculoskeletal: Positive for arthralgias and myalgias. Negative for back pain, gait problem, neck pain and neck stiffness.  Skin: Negative for itching and rash.  Neurological: Negative for dizziness, extremity weakness, gait problem, headaches, light-headedness and seizures.  Hematological: Negative for adenopathy. Does not bruise/bleed easily.  Psychiatric/Behavioral: Negative for confusion, depression and sleep disturbance. The patient is not nervous/anxious.     PHYSICAL EXAMINATION:  Blood pressure 125/67, pulse (!) 107, temperature 98.9 F (37.2 C), temperature source Oral, resp. rate 18, height 5' 1" (1.549 m), weight 125 lb 3.2 oz (56.8 kg), last menstrual period 04/28/2000, SpO2 98 %.  ECOG PERFORMANCE STATUS: 1 - Symptomatic but completely ambulatory  Physical Exam  Constitutional: Oriented to person, place, and time and well-developed, well-nourished, and in no distress.  HENT:  Head: Normocephalic and atraumatic.  Mouth/Throat: Oropharynx is clear and moist. No oropharyngeal exudate.  Eyes: Conjunctivae are normal. Right eye exhibits no discharge. Left eye exhibits no discharge. No scleral icterus.  Neck: Normal range of motion. Neck supple.  Cardiovascular: Normal rate, regular rhythm, normal heart sounds and  intact distal pulses.   Pulmonary/Chest: Effort normal and breath sounds normal. No respiratory distress. No wheezes. No rales.  Abdominal: Soft. Bowel sounds are normal. Exhibits no distension and no mass. There is no tenderness.  Musculoskeletal: Normal range of motion. Exhibits no edema.  Lymphadenopathy:    No cervical adenopathy.  Neurological: Alert and oriented to person, place, and time. Exhibits normal muscle tone. Gait normal. Coordination normal.  Skin: Skin is warm and dry. No rash noted. Not diaphoretic. No erythema. No pallor.  Psychiatric: Mood, memory and judgment normal.  Vitals reviewed.  LABORATORY DATA: Lab Results  Component Value Date   WBC 7.2 10/05/2018   HGB 11.0 (L) 10/05/2018   HCT 35.7 (L) 10/05/2018   MCV 92.7 10/05/2018   PLT 357 10/05/2018      Chemistry      Component Value Date/Time   NA 138 10/05/2018 1237   K 4.1 10/05/2018 1237   CL 105 10/05/2018 1237   CO2 22 10/05/2018 1237   BUN 17 10/05/2018 1237   CREATININE 0.89 10/05/2018 1237   CREATININE 0.80 07/24/2017 1438      Component Value Date/Time   CALCIUM 9.7 10/05/2018 1237   ALKPHOS 109 10/05/2018 1237   AST 23 10/05/2018 1237   ALT 18 10/05/2018 1237   BILITOT <0.2 (L) 10/05/2018 1237       RADIOGRAPHIC STUDIES:  No results found.   ASSESSMENT/PLAN:  This is a very pleasant 63 year old African-American female diagnosed with stage IIIa non-small cell lung cancer, adenocarcinoma.  She presented with a large right lower lobe lung mass with extension into the right hilum and subcarinal area.  She was diagnosed in July 2019.    She underwent a course of concurrent chemoradiation with weekly carboplatin and paclitaxel.  She is status post 7 cycles with a partial response.  The patient tolerated this treatment well except for mild odynophagia and dysphasia secondary to radiation treatment.  She is currently undergoing consolidation immunotherapy with Imfinzi 10 mg/kg IV every 2  weeks.  She is status post 17 cycles.  She is tolerating treatment well except for some fatigue, arthralgias, and myalgias.  Labs were reviewed with the patient.  I recommend that she proceed with cycle #18 today scheduled. I will arrange for restaging CT scan to be performed of the chest prior to her next visit. We will see her back for follow-up visit in 2 weeks for evaluation and to review her scan results before starting cycle #19. For the joint pain, she will continue to use Voltaren and aleve.  She will continue to use omeprazole for reflux. The patient was advised to call immediately if she has any concerning symptoms in the interval. The patient voices understanding of current disease status and treatment options and is in agreement with the current care plan. All questions were answered. The patient knows to call the clinic with any problems, questions or concerns. We can certainly see the patient much sooner if necessary   Orders Placed This Encounter  Procedures  . CT Chest W Contrast    Standing Status:   Future    Standing Expiration Date:   10/05/2019    Order Specific Question:   ** REASON FOR EXAM (FREE TEXT)    Answer:   Restaging Lung Cancer    Order Specific Question:   If indicated for the ordered procedure, I authorize the administration of contrast media per Radiology protocol    Answer:   Yes    Order Specific Question:   Preferred imaging location?    Answer:   [facility]    Order Specific Question:   Radiology Contrast Protocol - do NOT remove file path    Answer:   _[email or web address]     Gillespie, PA-C 10/05/18

## 2018-10-06 ENCOUNTER — Telehealth: Payer: Self-pay | Admitting: Internal Medicine

## 2018-10-06 NOTE — Telephone Encounter (Signed)
Scheduled appt per 6/09 los - pt to get an updated schedule next visit

## 2018-10-15 ENCOUNTER — Ambulatory Visit (HOSPITAL_COMMUNITY)
Admission: RE | Admit: 2018-10-15 | Discharge: 2018-10-15 | Disposition: A | Discharge status: Home / Self Care | Payer: Medicare Other | Source: Ambulatory Visit | Attending: Physician Assistant | Admitting: Physician Assistant

## 2018-10-15 ENCOUNTER — Other Ambulatory Visit: Payer: Self-pay

## 2018-10-15 ENCOUNTER — Other Ambulatory Visit: Payer: Self-pay | Admitting: Rheumatology

## 2018-10-15 DIAGNOSIS — C3491 Malignant neoplasm of unspecified part of right bronchus or lung: Secondary | ICD-10-CM | POA: Diagnosis present

## 2018-10-15 DIAGNOSIS — M0579 Rheumatoid arthritis with rheumatoid factor of multiple sites without organ or systems involvement: Secondary | ICD-10-CM

## 2018-10-15 MED ORDER — IOHEXOL 300 MG/ML  SOLN
75.0000 mL | Freq: Once | INTRAMUSCULAR | Status: AC | PRN
  Administered 2018-10-15: 75 mL via INTRAVENOUS

## 2018-10-15 MED ORDER — SODIUM CHLORIDE (PF) 0.9 % IJ SOLN
INTRAMUSCULAR | Status: AC
  Filled 2018-10-15: qty 50

## 2018-10-19 ENCOUNTER — Inpatient Hospital Stay: Payer: Medicare Other

## 2018-10-19 ENCOUNTER — Other Ambulatory Visit: Payer: Self-pay

## 2018-10-19 ENCOUNTER — Inpatient Hospital Stay: Payer: Medicare Other | Admitting: Internal Medicine

## 2018-10-19 ENCOUNTER — Encounter: Payer: Self-pay | Admitting: Internal Medicine

## 2018-10-19 VITALS — BP 119/83 | HR 115 | Temp 98.7°F | Resp 18 | Ht 61.0 in | Wt 126.9 lb

## 2018-10-19 VITALS — HR 99

## 2018-10-19 DIAGNOSIS — Z5112 Encounter for antineoplastic immunotherapy: Secondary | ICD-10-CM | POA: Diagnosis not present

## 2018-10-19 DIAGNOSIS — C3491 Malignant neoplasm of unspecified part of right bronchus or lung: Secondary | ICD-10-CM

## 2018-10-19 DIAGNOSIS — R590 Localized enlarged lymph nodes: Secondary | ICD-10-CM

## 2018-10-19 DIAGNOSIS — M069 Rheumatoid arthritis, unspecified: Secondary | ICD-10-CM

## 2018-10-19 DIAGNOSIS — R1310 Dysphagia, unspecified: Secondary | ICD-10-CM

## 2018-10-19 DIAGNOSIS — C3490 Malignant neoplasm of unspecified part of unspecified bronchus or lung: Secondary | ICD-10-CM

## 2018-10-19 DIAGNOSIS — C3431 Malignant neoplasm of lower lobe, right bronchus or lung: Secondary | ICD-10-CM

## 2018-10-19 LAB — CBC WITH DIFFERENTIAL (CANCER CENTER ONLY)
Abs Immature Granulocytes: 0.02 10*3/uL (ref 0.00–0.07)
Basophils Absolute: 0.1 10*3/uL (ref 0.0–0.1)
Basophils Relative: 1 %
Eosinophils Absolute: 0.2 10*3/uL (ref 0.0–0.5)
Eosinophils Relative: 3 %
HCT: 37.2 % (ref 36.0–46.0)
Hemoglobin: 11.5 g/dL — ABNORMAL LOW (ref 12.0–15.0)
Immature Granulocytes: 0 %
Lymphocytes Relative: 22 %
Lymphs Abs: 1.6 10*3/uL (ref 0.7–4.0)
MCH: 28.5 pg (ref 26.0–34.0)
MCHC: 30.9 g/dL (ref 30.0–36.0)
MCV: 92.3 fL (ref 80.0–100.0)
Monocytes Absolute: 0.9 10*3/uL (ref 0.1–1.0)
Monocytes Relative: 13 %
Neutro Abs: 4.2 10*3/uL (ref 1.7–7.7)
Neutrophils Relative %: 61 %
Platelet Count: 354 10*3/uL (ref 150–400)
RBC: 4.03 MIL/uL (ref 3.87–5.11)
RDW: 14.9 % (ref 11.5–15.5)
WBC Count: 6.9 10*3/uL (ref 4.0–10.5)
nRBC: 0 % (ref 0.0–0.2)

## 2018-10-19 LAB — CMP (CANCER CENTER ONLY)
ALT: 14 U/L (ref 0–44)
AST: 23 U/L (ref 15–41)
Albumin: 3.4 g/dL — ABNORMAL LOW (ref 3.5–5.0)
Alkaline Phosphatase: 115 U/L (ref 38–126)
Anion gap: 9 (ref 5–15)
BUN: 13 mg/dL (ref 8–23)
CO2: 24 mmol/L (ref 22–32)
Calcium: 9.7 mg/dL (ref 8.9–10.3)
Chloride: 105 mmol/L (ref 98–111)
Creatinine: 0.84 mg/dL (ref 0.44–1.00)
GFR, Est AFR Am: >60 mL/min (ref >60)
GFR, Estimated: >60 mL/min (ref >60)
Glucose, Bld: 98 mg/dL (ref 70–99)
Potassium: 3.8 mmol/L (ref 3.5–5.1)
Sodium: 138 mmol/L (ref 135–145)
Total Bilirubin: <0.2 mg/dL — ABNORMAL LOW (ref 0.3–1.2)
Total Protein: 8 g/dL (ref 6.5–8.1)

## 2018-10-19 LAB — TSH: TSH: 2.015 u[IU]/mL (ref 0.308–3.960)

## 2018-10-19 MED ORDER — SODIUM CHLORIDE 0.9 % IV SOLN
Freq: Once | INTRAVENOUS | Status: AC
  Administered 2018-10-19: 14:00:00 via INTRAVENOUS
  Filled 2018-10-19: qty 250

## 2018-10-19 MED ORDER — SODIUM CHLORIDE 0.9 % IV SOLN
620.0000 mg | Freq: Once | INTRAVENOUS | Status: DC
  Filled 2018-10-19: qty 12.4

## 2018-10-19 NOTE — Progress Notes (Signed)
Per Dr. Julien Nordmann, Imfinzi treatment has been cancelled for today. Pharmacy notified by MD.

## 2018-10-19 NOTE — Progress Notes (Signed)
[facility] [phone]   [phone]  OFFICE PROGRESS NOTE  Patient, No Pcp Per No address on file  DIAGNOSIS: Stage IIIA (T3, N1/N2, M0) non-small cell lung cancer, adenocarcinoma presented with large right lower lobe lung mass with extension to the right hilum and subcarinal area diagnosed in July 2019.  Biomarker Findings Tumor Mutational Burden - TMB-Intermediate (6 Muts/Mb) Microsatellite status - MS-Stable Genomic Findings For a complete list of the genes assayed, please refer to the Appendix. NRAS Q61R ARAF amplification STK11 G56W KRAS G13D MYCN amplification MCL1 amplification NKX2-1 amplification - equivocal? TP53 G245V 7 Disease relevant genes with no reportable alterations: EGFR, ALK, BRAF, MET, ERBB2, RET, ROS1   PRIOR THERAPY: Course of concurrent chemoradiation with weekly carboplatin for AUC of 2 and paclitaxel 45 mg/M2.  Status post 7 cycles.  Last dose was giving 01/11/2018.  CURRENT THERAPY: Consolidation treatment with immunotherapy with Imfinzi (Durvalumab) 10 mg/KG every 2 weeks.  First dose February 09, 2018.  Status post 18 cycles.  INTERVAL HISTORY: Susan Holder 63 y.o. female returns to the clinic today for follow-up visit.  The patient is feeling fine today with no concerning complaints except for pain in the knees.  She has no current chest pain, shortness of breath, cough or hemoptysis.  She denied having any fever or chills.  She has no nausea, vomiting, diarrhea or constipation.  She has no significant weight loss or night sweats.  She has been tolerating her treatment with Imfinzi fairly well except for the arthralgia.  The patient had repeat CT scan chest performed recently and she is here for evaluation and discussion of her scan results.   MEDICAL HISTORY: Past Medical History:  Diagnosis Date  . Rheumatoid arthritis ([place])     ALLERGIES:  has No Known Allergies.  MEDICATIONS:  Current Outpatient  Medications  Medication Sig Dispense Refill  . aspirin EC 81 MG tablet Take 81 mg by mouth daily.    . diclofenac sodium (VOLTAREN) 1 % GEL Apply 2 g topically 4 (four) times daily.    [name] (IMFINZI IV) Inject into the vein every 14 (fourteen) days.    . hydroxychloroquine (PLAQUENIL) 200 MG tablet TAKE 1 TABLET BY MOUTH TWICE DAILY MONDAY THROUGH FRIDAY 120 tablet 0  . naproxen sodium (ALEVE) 220 MG tablet Take 220 mg by mouth daily as needed (PAIN).    Marland Kitchen omeprazole (PRILOSEC) 20 MG capsule Take 20 mg by mouth daily.    Marland Kitchen Phenylephrine-DM-GG-APAP (DELSYM COUGH/COLD DAYTIME PO) Take by mouth as needed.     . pseudoephedrine-guaifenesin (MUCINEX D) 60-600 MG 12 hr tablet Take 1 tablet by mouth as needed.     Marland Kitchen VITAMIN D PO Take by mouth.     No current facility-administered medications for this visit.     SURGICAL HISTORY:  Past Surgical History:  Procedure Laterality Date  . BACK SURGERY    . BRONCHIAL NEEDLE ASPIRATION BIOPSY  11/09/2017   Procedure: BRONCHIAL NEEDLE ASPIRATION BIOPSIES;  Surgeon: Marshell Garfinkel, MD;  Location: [facility];  Service: Cardiopulmonary;;  . ENDOBRONCHIAL ULTRASOUND Bilateral 11/09/2017   Procedure: ENDOBRONCHIAL ULTRASOUND;  Surgeon: Marshell Garfinkel, MD;  Location: [facility];  Service: Cardiopulmonary;  Laterality: Bilateral;  . NASAL SINUS SURGERY    . NASAL SINUS SURGERY    . TUBAL LIGATION    . VIDEO BRONCHOSCOPY  11/09/2017   Procedure: VIDEO BRONCHOSCOPY;  Surgeon: Marshell Garfinkel, MD;  Location: [facility];  Service: Cardiopulmonary;;  REVIEW OF SYSTEMS:  Constitutional: positive for fatigue Eyes: negative Ears, nose, mouth, throat, and face: negative Respiratory: negative Cardiovascular: negative Gastrointestinal: negative Genitourinary:negative Integument/breast: negative Hematologic/lymphatic: negative Musculoskeletal:positive for arthralgias Neurological: negative Behavioral/Psych: negative Endocrine: negative  Allergic/Immunologic: negative   PHYSICAL EXAMINATION: General appearance: alert, cooperative, fatigued and no distress Head: Normocephalic, without obvious abnormality, atraumatic Neck: no adenopathy, no JVD, supple, symmetrical, trachea midline and thyroid not enlarged, symmetric, no tenderness/mass/nodules Lymph nodes: Cervical, supraclavicular, and axillary nodes normal. Resp: clear to auscultation bilaterally Back: symmetric, no curvature. ROM normal. No CVA tenderness. Cardio: regular rate and rhythm, S1, S2 normal, no murmur, click, rub or gallop GI: soft, non-tender; bowel sounds normal; no masses,  no organomegaly Extremities: extremities normal, atraumatic, no cyanosis or edema Neurologic: Alert and oriented X 3, normal strength and tone. Normal symmetric reflexes. Normal coordination and gait  ECOG PERFORMANCE STATUS: 1 - Symptomatic but completely ambulatory  Blood pressure 119/83, pulse (!) 115, temperature 98.7 F (37.1 C), temperature source Oral, resp. rate 18, height 5' 1" (1.549 m), weight 126 lb 14.4 oz (57.6 kg), last menstrual period 04/28/2000, SpO2 96 %.  LABORATORY DATA: Lab Results  Component Value Date   WBC 6.9 10/19/2018   HGB 11.5 (L) 10/19/2018   HCT 37.2 10/19/2018   MCV 92.3 10/19/2018   PLT 354 10/19/2018      Chemistry      Component Value Date/Time   NA 138 10/05/2018 1237   K 4.1 10/05/2018 1237   CL 105 10/05/2018 1237   CO2 22 10/05/2018 1237   BUN 17 10/05/2018 1237   CREATININE 0.89 10/05/2018 1237   CREATININE 0.80 07/24/2017 1438      Component Value Date/Time   CALCIUM 9.7 10/05/2018 1237   ALKPHOS 109 10/05/2018 1237   AST 23 10/05/2018 1237   ALT 18 10/05/2018 1237   BILITOT <0.2 (L) 10/05/2018 1237       RADIOGRAPHIC STUDIES: Ct Chest W Contrast  Result Date: 10/19/2018 CLINICAL DATA:  63 year old female with history of right-sided lung cancer diagnosed in June 2019 status post chemotherapy and radiation therapy  complete in September 2019. Chronic cough. Follow-up study. EXAM: CT CHEST WITH CONTRAST TECHNIQUE: Multidetector CT imaging of the chest was performed during intravenous contrast administration. CONTRAST:  7m OMNIPAQUE IOHEXOL 300 MG/ML  SOLN COMPARISON:  Chest CT 07/23/2018. FINDINGS: Cardiovascular: Heart size is normal. Small amount of pericardial fluid and/or thickening, somewhat nodular in appearance, most evident anteriorly (axial image 90 of series 2) where this measures up to 1 cm in thickness, new compared to the prior examination. There is aortic atherosclerosis, as well as atherosclerosis of the great vessels of the mediastinum and the coronary arteries, including calcified atherosclerotic plaque in the left anterior descending and right coronary arteries. Mediastinum/Nodes: Superior mediastinal lymph node to the right of the proximal trachea and right lobe of the thyroid gland measuring 3.0 x 2.9 cm. This exerts local mass effect upon adjacent structures causing marked narrowing of the right superior vena cava and anterior displacement of the right common carotid artery. No other definite mediastinal or hilar lymphadenopathy. Esophagus is unremarkable in appearance. No axillary lymphadenopathy. Lungs/Pleura: Postprocedural changes of radiation therapy are again noted in the right hemithorax with significant volume loss throughout the majority of the right upper lobe and central aspect of the right lower lobe where there is extensive thickening of the peribronchovascular interstitium with regional architectural distortion and localized cylindrical bronchiectasis, most compatible with postradiation mass-like fibrosis. This is very similar to  the prior study, without definitive evidence to suggest local recurrence of disease. A few scattered peripheral predominant ground-glass attenuation micro nodules are noted, nonspecific but statistically likely benign. No other new suspicious appearing pulmonary  nodules or masses are noted. No acute consolidative airspace disease. Diffuse bronchial wall thickening with mild to moderate centrilobular and paraseptal emphysema. Upper Abdomen: Aortic atherosclerosis. Musculoskeletal: In the anterolateral aspect of the right seventh rib (axial image 104 of series 5) there is a sclerotic lesion which appears similar to prior studies, not hypermetabolic on prior PET-CT 85/27/7824, favored to be benign. There are no other new aggressive appearing lytic or blastic lesions noted in the visualized portions of the skeleton. IMPRESSION: 1. Superior mediastinal lymphadenopathy the just lateral to the right lobe of the thyroid gland, concerning for nodal metastasis. 2. Stable postradiation changes in the right lung without evidence to suggest local recurrence of disease. 3. Small amount of pericardial fluid and/or thickening, most notably anteriorly which is somewhat nodular in appearance. This is new compared to the prior study. Although nonspecific, close attention on follow-up studies is recommended, as the possibility of pericardial metastasis is not excluded. 4. Multiple peripheral predominant ground-glass attenuation micronodules in the lungs bilaterally, nonspecific but statistically likely benign. Attention on follow-up studies is recommended to ensure resolution. 5. Diffuse bronchial wall thickening with mild to moderate centrilobular and paraseptal emphysema. 6. Aortic atherosclerosis, in addition to 2 vessel coronary artery disease. Please note that although the presence of coronary artery calcium documents the presence of coronary artery disease, the severity of this disease and any potential stenosis cannot be assessed on this non-gated CT examination. Assessment for potential risk factor modification, dietary therapy or pharmacologic therapy may be warranted, if clinically indicated. 7. Additional incidental findings, as above. These results were called by telephone at the  time of interpretation on 10/19/2018 at 1:50 pm to Dr. Earlie Server, who verbally acknowledged these results. Aortic Atherosclerosis (ICD10-I70.0) and Emphysema (ICD10-J43.9). Electronically Signed   By: Vinnie Langton M.D.   On: 10/19/2018 13:52    ASSESSMENT AND PLAN: This is a very pleasant 63 years old African-American female recently diagnosed with a stage IIIA non-small cell lung cancer, adenocarcinoma.  She underwent a course of concurrent chemoradiation with weekly carboplatin and paclitaxel status post 7 cycles with partial response.   The patient tolerated this course of treatment well except for mild odynophagia and dysphagia. She is currently on consolidation treatment with immunotherapy with Imfinzi (Durvalumab) status post 18 cycles. The patient has been tolerating this treatment well with no concerning adverse effects except for arthralgia. She had repeat CT scan of the chest performed recently.  I personally and independently reviewed the scans and discussed the results with the patient today.  Unfortunately his scan showed superior mediastinal lymphadenopathy just lateral to the right lobe of the thyroid gland concerning for nodal metastasis.  There was also small amount of pericardial fluid and thickening anteriorly with nodular appearance that is new compared to the prior study and also could be concerning for pericardial metastasis. I had a lengthy discussion with the patient today about her condition and treatment options.  I recommended for the patient to hold her treatment with Imfinzi for now until we investigate the abnormality seen on the current scan and to rule out evidence of disease progression. I recommended for the patient to have a PET scan performed in the next 1-2 weeks to rule out disease progression and also to evaluate the nodularity in the pericardium. If PET scan  showed no concerning findings for progression, she will resume her treatment with Imfinzi otherwise we will  consider the patient for different treatment options. The patient agreed with the current plan.  She will come back for follow-up visit in 2 weeks. She was advised to call immediately if she has any concerning symptoms in the interval. The patient voices understanding of current disease status and treatment options and is in agreement with the current care plan. All questions were answered. The patient knows to call the clinic with any problems, questions or concerns. We can certainly see the patient much sooner if necessary.  Disclaimer: This note was dictated with voice recognition software. Similar sounding words can inadvertently be transcribed and may not be corrected upon review.

## 2018-10-27 ENCOUNTER — Telehealth: Payer: Self-pay | Admitting: *Deleted

## 2018-10-27 NOTE — Telephone Encounter (Signed)
Called pt back, unable to reach. LMOVM for pt: Appt for 7/6 cancelled, Pet on 7/7 and pt to see Cassie PA on 7/8 to discuss pet scan results

## 2018-10-28 ENCOUNTER — Telehealth: Payer: Self-pay | Admitting: *Deleted

## 2018-10-28 NOTE — Telephone Encounter (Signed)
Returned call ot pt discussed appts next week. Pt confirmed understanding.

## 2018-11-01 ENCOUNTER — Ambulatory Visit: Payer: BC Managed Care – PPO | Admitting: Physician Assistant

## 2018-11-01 ENCOUNTER — Ambulatory Visit: Payer: BC Managed Care – PPO

## 2018-11-01 ENCOUNTER — Other Ambulatory Visit: Payer: BC Managed Care – PPO

## 2018-11-02 ENCOUNTER — Ambulatory Visit (HOSPITAL_COMMUNITY)
Admission: RE | Admit: 2018-11-02 | Discharge: 2018-11-02 | Disposition: A | Discharge status: Home / Self Care | Payer: Medicare Other | Source: Ambulatory Visit | Attending: Internal Medicine | Admitting: Internal Medicine

## 2018-11-02 ENCOUNTER — Other Ambulatory Visit: Payer: Self-pay

## 2018-11-02 DIAGNOSIS — Z7982 Long term (current) use of aspirin: Secondary | ICD-10-CM | POA: Insufficient documentation

## 2018-11-02 DIAGNOSIS — Z79899 Other long term (current) drug therapy: Secondary | ICD-10-CM | POA: Insufficient documentation

## 2018-11-02 DIAGNOSIS — C3490 Malignant neoplasm of unspecified part of unspecified bronchus or lung: Secondary | ICD-10-CM | POA: Diagnosis not present

## 2018-11-02 DIAGNOSIS — M069 Rheumatoid arthritis, unspecified: Secondary | ICD-10-CM | POA: Insufficient documentation

## 2018-11-02 LAB — GLUCOSE, CAPILLARY: Glucose-Capillary: 79 mg/dL (ref 70–99)

## 2018-11-02 MED ORDER — FLUDEOXYGLUCOSE F - 18 (FDG) INJECTION
6.8000 | Freq: Once | INTRAVENOUS | Status: AC | PRN
  Administered 2018-11-02: 6.8 via INTRAVENOUS

## 2018-11-03 ENCOUNTER — Encounter: Payer: Self-pay | Admitting: Physician Assistant

## 2018-11-03 ENCOUNTER — Other Ambulatory Visit: Payer: Self-pay

## 2018-11-03 ENCOUNTER — Inpatient Hospital Stay: Payer: Medicare Other | Attending: Internal Medicine | Admitting: Physician Assistant

## 2018-11-03 ENCOUNTER — Telehealth: Payer: Self-pay | Admitting: Radiation Oncology

## 2018-11-03 VITALS — BP 102/65 | HR 108 | Temp 98.9°F | Resp 18 | Ht 61.0 in | Wt 129.5 lb

## 2018-11-03 DIAGNOSIS — C3431 Malignant neoplasm of lower lobe, right bronchus or lung: Secondary | ICD-10-CM | POA: Diagnosis present

## 2018-11-03 DIAGNOSIS — R590 Localized enlarged lymph nodes: Secondary | ICD-10-CM | POA: Diagnosis not present

## 2018-11-03 DIAGNOSIS — C3491 Malignant neoplasm of unspecified part of right bronchus or lung: Secondary | ICD-10-CM

## 2018-11-03 NOTE — Progress Notes (Signed)
Radiation Oncology         [phone] ________________________________  Outpatient Re-Consultation - Conducted via telephone due to current COVID-19 concerns for limiting patient exposure  I spoke with the patient to conduct this consult visit via telephone to spare the patient unnecessary potential exposure in the healthcare setting during the current COVID-19 pandemic. The patient was notified in advance and was offered a [place] meeting to allow for face to face communication but unfortunately reported that they did not have the appropriate resources/technology to support such a visit and instead preferred to proceed with a telephone consult.   Name: Susan Holder MRN: [id number]  Date of Service: 11/04/2018 DOB: June 20, 1955  Reconsultation Note  CC: Patient, No Pcp Per  Curt Bears, MD  Diagnosis:  Progressive Metastatic Stage IIIA, cT3N1-2M0 NSCLC, adenocarcinoma of the right lower lobe.   Interval Since Last Radiation:  10 months  11/30/17-01/11/18: 66 Gy to the chest and regional nodes over 33 fractions  Narrative:  Susan Holder is a pleasant 63 y.o. female with a history of right lung cancer. The patient had symptoms of cough and congestion and was seen in an urgent care setting at [facility] and completed a course of antibiotic therapy. Her symptoms did not improve and a CXR on 09/24/17 revealed fullness in the right hilum. [name] had a CT chest on 09/29/17 that revealed a 4.6 x 3.9 cm mass in the right lower lobe and subcarinal adenopathy measuring 2.3 x 1.6 cm was noted. A PET scan on 10/06/17 revealed this mass in the RLL abutting the hilum and the SUV measured 8.1. There was no hypermetabolic mediastinal disease, and no supraclavicular adenopathy was noted. A bronchoscopy with EBUS on 11/09/17 revealed malignant cells consistent with NSCLC, and this was consistent with adenocarcinoma. there could also be neuroendocrine differentiation. She went on to complete chemoRT in September 2019. She  has been followed with Dr. Worthy Flank team while on consolidative Imfinzi immunotherapy. She recently had a CT that showed some concerns for adenopathy near her primary tumor. A PET scan on 11/02/2018 revealed hypermetabolic change around a 3.2 x 3.2 cm nodal metastasis in the thoracic inlet about the clavicle. This was new from prior PET imaging and had an SUV of 12.1. There were bilateral axillary nodes with low level uptake felt to be reactive in nature. She is contacted by phone to discuss options of  Radiotherapy to the supraclavicular node.    On review of systems, the patient states she is doing much better. She reports that since her bronchoscopy she has had some trouble with hoarseness. She reports this has not lessened since her procedure. Her skin is healing well and she reports it is almost completely resolved. She does have a chronic cough. No other complaints are noted.   Past Medical History:  Past Medical History:  Diagnosis Date   Rheumatoid arthritis ([place])     Past Surgical History: Past Surgical History:  Procedure Laterality Date   BACK SURGERY     BRONCHIAL NEEDLE ASPIRATION BIOPSY  11/09/2017   Procedure: BRONCHIAL NEEDLE ASPIRATION BIOPSIES;  Surgeon: Marshell Garfinkel, MD;  Location: [facility];  Service: Cardiopulmonary;;   ENDOBRONCHIAL ULTRASOUND Bilateral 11/09/2017   Procedure: ENDOBRONCHIAL ULTRASOUND;  Surgeon: Marshell Garfinkel, MD;  Location: [facility];  Service: Cardiopulmonary;  Laterality: Bilateral;   NASAL SINUS SURGERY     NASAL SINUS SURGERY     TUBAL LIGATION     VIDEO BRONCHOSCOPY  11/09/2017   Procedure: VIDEO BRONCHOSCOPY;  Surgeon: Vaughan Browner,  Hart Robinsons, MD;  Location: [facility];  Service: Cardiopulmonary;;    Social History:  Social History   Socioeconomic History   Marital status: Divorced    Spouse name: Not on file   Number of children: Not on file   Years of education: Not on file   Highest education level: Not on file    Occupational History   Not on file  Social Needs   Financial resource strain: Not on file   Food insecurity    Worry: Not on file    Inability: Not on file   Transportation needs    Medical: Not on file    Non-medical: Not on file  Tobacco Use   Smoking status: Former Smoker    Packs/day: 0.50    Years: 45.00    Pack years: 22.50    Types: Cigarettes    Quit date: 09/29/2017    Years since quitting: 1.1   Smokeless tobacco: Never Used  Substance and Sexual Activity   Alcohol use: Not Currently   Drug use: No   Sexual activity: Not Currently  Lifestyle   Physical activity    Days per week: Not on file    Minutes per session: Not on file   Stress: Not on file  Relationships   Social connections    Talks on phone: Not on file    Gets together: Not on file    Attends religious service: Not on file    Active member of club or organization: Not on file    Attends meetings of clubs or organizations: Not on file    Relationship status: Not on file   Intimate partner violence    Fear of current or ex partner: No    Emotionally abused: No    Physically abused: No    Forced sexual activity: No  Other Topics Concern   Not on file  Social History Narrative   Not on file    Family History: Family History  Problem Relation Age of Onset   Stroke Mother    Alzheimer's disease Mother    Heart disease Mother    Emphysema Father    Hypertension Brother    Heart attack Maternal Aunt    Heart failure Maternal Grandmother    Hypertension Paternal Grandmother      ALLERGIES:  has No Known Allergies.  Meds: Current Outpatient Medications  Medication Sig Dispense Refill   aspirin EC 81 MG tablet Take 81 mg by mouth daily.     diclofenac sodium (VOLTAREN) 1 % GEL Apply 2 g topically 4 (four) times daily.     Durvalumab (IMFINZI IV) Inject into the vein every 14 (fourteen) days.     hydroxychloroquine (PLAQUENIL) 200 MG tablet TAKE 1 TABLET BY MOUTH  TWICE DAILY MONDAY THROUGH FRIDAY 120 tablet 0   naproxen sodium (ALEVE) 220 MG tablet Take 220 mg by mouth daily as needed (PAIN).     omeprazole (PRILOSEC) 20 MG capsule Take 20 mg by mouth daily.     Phenylephrine-DM-GG-APAP (DELSYM COUGH/COLD DAYTIME PO) Take by mouth as needed.      pseudoephedrine-guaifenesin (MUCINEX D) 60-600 MG 12 hr tablet Take 1 tablet by mouth as needed.      VITAMIN D PO Take by mouth.     No current facility-administered medications for this visit.     Physical Findings: Unable to assess due to encounter type  Lab Findings: Lab Results  Component Value Date   WBC 6.9 10/19/2018   HGB 11.5 (  L) 10/19/2018   HCT 37.2 10/19/2018   MCV 92.3 10/19/2018   PLT 354 10/19/2018     Radiographic Findings: Ct Chest W Contrast  Result Date: 10/19/2018 CLINICAL DATA:  63 year old female with history of right-sided lung cancer diagnosed in June 2019 status post chemotherapy and radiation therapy complete in September 2019. Chronic cough. Follow-up study. EXAM: CT CHEST WITH CONTRAST TECHNIQUE: Multidetector CT imaging of the chest was performed during intravenous contrast administration. CONTRAST:  77mL OMNIPAQUE IOHEXOL 300 MG/ML  SOLN COMPARISON:  Chest CT 07/23/2018. FINDINGS: Cardiovascular: Heart size is normal. Small amount of pericardial fluid and/or thickening, somewhat nodular in appearance, most evident anteriorly (axial image 90 of series 2) where this measures up to 1 cm in thickness, new compared to the prior examination. There is aortic atherosclerosis, as well as atherosclerosis of the great vessels of the mediastinum and the coronary arteries, including calcified atherosclerotic plaque in the left anterior descending and right coronary arteries. Mediastinum/Nodes: Superior mediastinal lymph node to the right of the proximal trachea and right lobe of the thyroid gland measuring 3.0 x 2.9 cm. This exerts local mass effect upon adjacent structures causing  marked narrowing of the right superior vena cava and anterior displacement of the right common carotid artery. No other definite mediastinal or hilar lymphadenopathy. Esophagus is unremarkable in appearance. No axillary lymphadenopathy. Lungs/Pleura: Postprocedural changes of radiation therapy are again noted in the right hemithorax with significant volume loss throughout the majority of the right upper lobe and central aspect of the right lower lobe where there is extensive thickening of the peribronchovascular interstitium with regional architectural distortion and localized cylindrical bronchiectasis, most compatible with postradiation mass-like fibrosis. This is very similar to the prior study, without definitive evidence to suggest local recurrence of disease. A few scattered peripheral predominant ground-glass attenuation micro nodules are noted, nonspecific but statistically likely benign. No other new suspicious appearing pulmonary nodules or masses are noted. No acute consolidative airspace disease. Diffuse bronchial wall thickening with mild to moderate centrilobular and paraseptal emphysema. Upper Abdomen: Aortic atherosclerosis. Musculoskeletal: In the anterolateral aspect of the right seventh rib (axial image 104 of series 5) there is a sclerotic lesion which appears similar to prior studies, not hypermetabolic on prior PET-CT 53/97/6734, favored to be benign. There are no other new aggressive appearing lytic or blastic lesions noted in the visualized portions of the skeleton. IMPRESSION: 1. Superior mediastinal lymphadenopathy the just lateral to the right lobe of the thyroid gland, concerning for nodal metastasis. 2. Stable postradiation changes in the right lung without evidence to suggest local recurrence of disease. 3. Small amount of pericardial fluid and/or thickening, most notably anteriorly which is somewhat nodular in appearance. This is new compared to the prior study. Although nonspecific,  close attention on follow-up studies is recommended, as the possibility of pericardial metastasis is not excluded. 4. Multiple peripheral predominant ground-glass attenuation micronodules in the lungs bilaterally, nonspecific but statistically likely benign. Attention on follow-up studies is recommended to ensure resolution. 5. Diffuse bronchial wall thickening with mild to moderate centrilobular and paraseptal emphysema. 6. Aortic atherosclerosis, in addition to 2 vessel coronary artery disease. Please note that although the presence of coronary artery calcium documents the presence of coronary artery disease, the severity of this disease and any potential stenosis cannot be assessed on this non-gated CT examination. Assessment for potential risk factor modification, dietary therapy or pharmacologic therapy may be warranted, if clinically indicated. 7. Additional incidental findings, as above. These results were called by telephone  at the time of interpretation on 10/19/2018 at 1:50 pm to Dr. Earlie Server, who verbally acknowledged these results. Aortic Atherosclerosis (ICD10-I70.0) and Emphysema (ICD10-J43.9). Electronically Signed   By: Vinnie Langton M.D.   On: 10/19/2018 13:52   Nm Pet Image Restag (ps) Skull Base To Thigh  Result Date: 11/03/2018 CLINICAL DATA:  Subsequent treatment strategy for right-sided lung cancer diagnosed in 2019 post chemotherapy and radiation therapy. Right superior mediastinal adenopathy on recent CT. EXAM: NUCLEAR MEDICINE PET SKULL BASE TO THIGH TECHNIQUE: 6.8 mCi F-18 FDG was injected intravenously. Full-ring PET imaging was performed from the skull base to thigh after the radiotracer. CT data was obtained and used for attenuation correction and anatomic localization. Fasting blood glucose: 79 mg/dl COMPARISON:  PET-CT 10/06/2017.  Chest CT 10/15/2018. FINDINGS: Mediastinal blood pool activity: SUV max 1.6 Liver activity: SUV max NA NECK: There is a large hypermetabolic nodal mass  at the thoracic inlet on the right, measuring 3.2 x 3.2 cm on image 44/4. This has an SUV max of 12.1 and is new from the previous PET-CT. There are no other hypermetabolic cervical lymph nodes.There are no lesions of the pharyngeal mucosal space. Incidental CT findings: none CHEST: The large right supraclavicular node described above extends into the superior mediastinum at the level of the thyroid gland. No other hypermetabolic mediastinal or hilar lymph nodes are demonstrated. There is mild hypermetabolic activity associated with small axillary lymph nodes bilaterally (SUV max of 5.4 on the left). There are stable radiation changes in the right perihilar region without associated hypermetabolic activity. No suspicious pulmonary activity. Incidental CT findings: Emphysema, post treatment changes and scattered pulmonary ground-glass opacities are stable from the recent CT. ABDOMEN/PELVIS: There is no hypermetabolic activity within the liver, adrenal glands, spleen or pancreas. There is no suspicious hypermetabolic nodal activity. There are small inguinal lymph nodes bilaterally which demonstrate low-level hypermetabolic activity (up to SUV of 3.1 on the left). Incidental CT findings: Moderate diffuse aortic and branch vessel atherosclerosis. Surgical clips in the pelvis. SKELETON: There is no hypermetabolic activity to suggest osseous metastatic disease. There is scattered arthropathic activity at the shoulders, elbows and wrists bilaterally. Incidental CT findings: none IMPRESSION: 1. The large right supraclavicular lymph node seen on recent chest CT is hypermetabolic, consistent with a nodal metastasis. This should be amenable to percutaneous biopsy if clinically warranted. 2. No other definite evidence of metastatic disease. Small axillary and inguinal lymph nodes bilaterally are mildly hypermetabolic, but likely reactive. 3. No residual activity in the right perihilar region to suggest local recurrence.  Underlying radiation changes in this area are stable. Electronically Signed   By: Richardean Sale M.D.   On: 11/03/2018 08:19    Impression/Plan: 1. Progressive Metastatic Stage IIIA, cT3N1-2M0 NSCLC, adenocarcinoma of the right lower lobe to the supraclavicular node. Dr. Lisbeth Renshaw discusses the imaging findings and rationale to consider radiotherapy to this site. The options would be palliative treatment or definitive therapy. Given her performance status and previous tolerance to radiotherapy, Dr. Lisbeth Renshaw offers her definitive treatment.  We discussed the risks, benefits, short, and long term effects of radiotherapy, and the patient is interested in proceeding. Dr. Lisbeth Renshaw discusses the delivery and logistics of radiotherapy and anticipates a course of 5 weeks of radiotherapy. She is in agreement to proceed. She will be contacted by staff to coordinate simulation in the near future.    Given current concerns for patient exposure during the COVID-19 pandemic, this encounter was conducted via telephone.  The patient has given verbal  consent for this type of encounter. The time spent during this encounter was 30 minutes and 50% of that time was spent in the coordination of her care. The attendants for this meeting included Dr. Lisbeth Renshaw, Shona Simpson, Jackson South and Farrel Gobble  During the encounter, Dr. Lisbeth Renshaw and Shona Simpson [facility] were located at [facility] Radiation Oncology Department.  Susan Holder  was located at home.    Carola Rhine, PAC

## 2018-11-03 NOTE — Progress Notes (Signed)
[facility] OFFICE PROGRESS NOTE  Patient, No Pcp Per No address on file  DIAGNOSIS: Stage IIIA (T3,N1/N2, M0)non-small cell lung cancer, adenocarcinoma presented with large right lower lobe lung mass with extension to the right hilum and subcarinal area diagnosed in July 2019.  Biomarker Findings Tumor Mutational Burden - TMB-Intermediate (6 Muts/Mb) Microsatellite status - MS-Stable Genomic Findings For a complete list of the genes assayed, please refer to the Appendix. NRAS Q61R ARAF amplification STK11 G56W KRAS G13D MYCN amplification MCL1 amplification NKX2-1 amplification - equivocal? TP53 G245V 7 Disease relevant genes with no reportable alterations: EGFR, ALK, BRAF, MET, ERBB2, RET, ROS1  PRIOR THERAPY:  1) Course of concurrent chemoradiation with weekly carboplatin for AUC of 2 and paclitaxel 45 mg/M2. Status post 7 cycles. Last dose was giving 01/11/2018. 2) Consolidation treatment with immunotherapy with Imfinzi (Durvalumab) 10 mg/KG every 2 weeks.  First dose February 09, 2018.  Status post 18 cycles.  CURRENT THERAPY: Consideration of radiation to the right supraclavicular lymph node.   INTERVAL HISTORY: Susan Holder 63 y.o. female returns to the clinic for a follow-up visit.  The patient is feeling well today without any concerning complaints.  She denies any fever, chills, night sweats, or weight loss.  She denies any shortness of breath or hemoptysis.  She reports her usual persistent cough for which she has been taking omeprazole and occasionally Robitussin.  Her cough has improved with these interventions, however; she still occasionally has an intermittent cough.  She also states that she occasionally gets a localized "soreness" in her right anterior chest wall when she coughs.  She denies any exertional chest pain or pleuritic chest pain.  She denies any nausea, vomiting, diarrhea, or constipation.  She denies any headaches or visual changes.  The patient was recently being treated with immunotherapy with Imfinzi.  She recently had a restaging CT scan performed after cycle #18 which showed concerning findings for disease progression. Her treatment has been on hold for the last 4 weeks pending further evaluation.  She recently had a PET scan performed to further evaluate her condition.  She is here today for evaluation and to discuss her scan results and treatment options.  MEDICAL HISTORY: Past Medical History:  Diagnosis Date  . Rheumatoid arthritis ([place])     ALLERGIES:  has No Known Allergies.  MEDICATIONS:  Current Outpatient Medications  Medication Sig Dispense Refill  . aspirin EC 81 MG tablet Take 81 mg by mouth daily.    . diclofenac sodium (VOLTAREN) 1 % GEL Apply 2 g topically 4 (four) times daily.    . hydroxychloroquine (PLAQUENIL) 200 MG tablet TAKE 1 TABLET BY MOUTH TWICE DAILY MONDAY THROUGH FRIDAY 120 tablet 0  . naproxen sodium (ALEVE) 220 MG tablet Take 220 mg by mouth daily as needed (PAIN).    Marland Kitchen omeprazole (PRILOSEC) 20 MG capsule Take 20 mg by mouth daily.    Marland Kitchen Phenylephrine-DM-GG-APAP (DELSYM COUGH/COLD DAYTIME PO) Take by mouth as needed.     . pseudoephedrine-guaifenesin (MUCINEX D) 60-600 MG 12 hr tablet Take 1 tablet by mouth as needed.     Marland Kitchen VITAMIN D PO Take by mouth.    [name] (IMFINZI IV) Inject into the vein every 14 (fourteen) days.     No current facility-administered medications for this visit.     SURGICAL HISTORY:  Past Surgical History:  Procedure Laterality Date  . BACK SURGERY    . BRONCHIAL NEEDLE ASPIRATION BIOPSY  11/09/2017   Procedure: BRONCHIAL  NEEDLE ASPIRATION BIOPSIES;  Surgeon: Marshell Garfinkel, MD;  Location: [facility];  Service: Cardiopulmonary;;  . ENDOBRONCHIAL ULTRASOUND Bilateral 11/09/2017   Procedure: ENDOBRONCHIAL ULTRASOUND;  Surgeon: Marshell Garfinkel, MD;  Location: [facility];  Service: Cardiopulmonary;  Laterality: Bilateral;  . NASAL SINUS SURGERY    .  NASAL SINUS SURGERY    . TUBAL LIGATION    . VIDEO BRONCHOSCOPY  11/09/2017   Procedure: VIDEO BRONCHOSCOPY;  Surgeon: Marshell Garfinkel, MD;  Location: [facility];  Service: Cardiopulmonary;;    REVIEW OF SYSTEMS:   Review of Systems  Constitutional: Negative for appetite change, chills, fatigue, fever and unexpected weight change.  HENT: Negative for mouth sores, nosebleeds, sore throat and trouble swallowing.   Eyes: Negative for eye problems and icterus.  Respiratory: Positive for baseline cough (improved from prior). Negative for hemoptysis, shortness of breath and wheezing.   Cardiovascular: Positive for right anterior chest soreness with coughing. Negative for leg swelling.  Gastrointestinal: Negative for abdominal pain, constipation, diarrhea, nausea and vomiting.  Genitourinary: Negative for bladder incontinence, difficulty urinating, dysuria, frequency and hematuria.   Musculoskeletal: Negative for back pain, gait problem, neck pain and neck stiffness.  Skin: Negative for itching and rash.  Neurological: Negative for dizziness, extremity weakness, gait problem, headaches, light-headedness and seizures.  Hematological: Negative for adenopathy. Does not bruise/bleed easily.  Psychiatric/Behavioral: Negative for confusion, depression and sleep disturbance. The patient is not nervous/anxious.     PHYSICAL EXAMINATION:  Blood pressure 102/65, pulse (!) 108, temperature 98.9 F (37.2 C), temperature source Oral, resp. rate 18, height 5' 1"  (1.549 m), weight 129 lb 8 oz (58.7 kg), last menstrual period 04/28/2000, SpO2 100 %.  ECOG PERFORMANCE STATUS: 1 - Symptomatic but completely ambulatory  Physical Exam  Constitutional: Oriented to person, place, and time and well-developed, well-nourished, and in no distress.  HENT:  Head: Normocephalic and atraumatic.  Mouth/Throat: Oropharynx is clear and moist. No oropharyngeal exudate.  Eyes: Conjunctivae are normal. Right eye exhibits no  discharge. Left eye exhibits no discharge. No scleral icterus.  Neck: Normal range of motion. Neck supple.  Cardiovascular: Normal rate, regular rhythm, normal heart sounds and intact distal pulses.   Pulmonary/Chest: Effort normal and breath sounds normal. No respiratory distress. No wheezes. No rales.  Abdominal: Soft. Bowel sounds are normal. Exhibits no distension and no mass. There is no tenderness.  Musculoskeletal: Normal range of motion. Exhibits no edema.  Lymphadenopathy:    No cervical adenopathy.  Neurological: Alert and oriented to person, place, and time. Exhibits normal muscle tone. Gait normal. Coordination normal.  Skin: Skin is warm and dry. No rash noted. Not diaphoretic. No erythema. No pallor.  Psychiatric: Mood, memory and judgment normal.  Vitals reviewed.  LABORATORY DATA: Lab Results  Component Value Date   WBC 6.9 10/19/2018   HGB 11.5 (L) 10/19/2018   HCT 37.2 10/19/2018   MCV 92.3 10/19/2018   PLT 354 10/19/2018      Chemistry      Component Value Date/Time   NA 138 10/19/2018 1134   K 3.8 10/19/2018 1134   CL 105 10/19/2018 1134   CO2 24 10/19/2018 1134   BUN 13 10/19/2018 1134   CREATININE 0.84 10/19/2018 1134   CREATININE 0.80 07/24/2017 1438      Component Value Date/Time   CALCIUM 9.7 10/19/2018 1134   ALKPHOS 115 10/19/2018 1134   AST 23 10/19/2018 1134   ALT 14 10/19/2018 1134   BILITOT <0.2 (L) 10/19/2018 1134  RADIOGRAPHIC STUDIES:  Ct Chest W Contrast  Result Date: 10/19/2018 CLINICAL DATA:  63 year old female with history of right-sided lung cancer diagnosed in June 2019 status post chemotherapy and radiation therapy complete in September 2019. Chronic cough. Follow-up study. EXAM: CT CHEST WITH CONTRAST TECHNIQUE: Multidetector CT imaging of the chest was performed during intravenous contrast administration. CONTRAST:  31m OMNIPAQUE IOHEXOL 300 MG/ML  SOLN COMPARISON:  Chest CT 07/23/2018. FINDINGS: Cardiovascular: Heart  size is normal. Small amount of pericardial fluid and/or thickening, somewhat nodular in appearance, most evident anteriorly (axial image 90 of series 2) where this measures up to 1 cm in thickness, new compared to the prior examination. There is aortic atherosclerosis, as well as atherosclerosis of the great vessels of the mediastinum and the coronary arteries, including calcified atherosclerotic plaque in the left anterior descending and right coronary arteries. Mediastinum/Nodes: Superior mediastinal lymph node to the right of the proximal trachea and right lobe of the thyroid gland measuring 3.0 x 2.9 cm. This exerts local mass effect upon adjacent structures causing marked narrowing of the right superior vena cava and anterior displacement of the right common carotid artery. No other definite mediastinal or hilar lymphadenopathy. Esophagus is unremarkable in appearance. No axillary lymphadenopathy. Lungs/Pleura: Postprocedural changes of radiation therapy are again noted in the right hemithorax with significant volume loss throughout the majority of the right upper lobe and central aspect of the right lower lobe where there is extensive thickening of the peribronchovascular interstitium with regional architectural distortion and localized cylindrical bronchiectasis, most compatible with postradiation mass-like fibrosis. This is very similar to the prior study, without definitive evidence to suggest local recurrence of disease. A few scattered peripheral predominant ground-glass attenuation micro nodules are noted, nonspecific but statistically likely benign. No other new suspicious appearing pulmonary nodules or masses are noted. No acute consolidative airspace disease. Diffuse bronchial wall thickening with mild to moderate centrilobular and paraseptal emphysema. Upper Abdomen: Aortic atherosclerosis. Musculoskeletal: In the anterolateral aspect of the right seventh rib (axial image 104 of series 5) there is a  sclerotic lesion which appears similar to prior studies, not hypermetabolic on prior PET-CT 074/25/9563 favored to be benign. There are no other new aggressive appearing lytic or blastic lesions noted in the visualized portions of the skeleton. IMPRESSION: 1. Superior mediastinal lymphadenopathy the just lateral to the right lobe of the thyroid gland, concerning for nodal metastasis. 2. Stable postradiation changes in the right lung without evidence to suggest local recurrence of disease. 3. Small amount of pericardial fluid and/or thickening, most notably anteriorly which is somewhat nodular in appearance. This is new compared to the prior study. Although nonspecific, close attention on follow-up studies is recommended, as the possibility of pericardial metastasis is not excluded. 4. Multiple peripheral predominant ground-glass attenuation micronodules in the lungs bilaterally, nonspecific but statistically likely benign. Attention on follow-up studies is recommended to ensure resolution. 5. Diffuse bronchial wall thickening with mild to moderate centrilobular and paraseptal emphysema. 6. Aortic atherosclerosis, in addition to 2 vessel coronary artery disease. Please note that although the presence of coronary artery calcium documents the presence of coronary artery disease, the severity of this disease and any potential stenosis cannot be assessed on this non-gated CT examination. Assessment for potential risk factor modification, dietary therapy or pharmacologic therapy may be warranted, if clinically indicated. 7. Additional incidental findings, as above. These results were called by telephone at the time of interpretation on 10/19/2018 at 1:50 pm to Dr. MEarlie Server who verbally acknowledged these results. Aortic Atherosclerosis (  ICD10-I70.0) and Emphysema (ICD10-J43.9). Electronically Signed   By: Vinnie Langton M.D.   On: 10/19/2018 13:52   Nm Pet Image Restag (ps) Skull Base To Thigh  Result Date:  11/03/2018 CLINICAL DATA:  Subsequent treatment strategy for right-sided lung cancer diagnosed in 2019 post chemotherapy and radiation therapy. Right superior mediastinal adenopathy on recent CT. EXAM: NUCLEAR MEDICINE PET SKULL BASE TO THIGH TECHNIQUE: 6.8 mCi F-18 FDG was injected intravenously. Full-ring PET imaging was performed from the skull base to thigh after the radiotracer. CT data was obtained and used for attenuation correction and anatomic localization. Fasting blood glucose: 79 mg/dl COMPARISON:  PET-CT 10/06/2017.  Chest CT 10/15/2018. FINDINGS: Mediastinal blood pool activity: SUV max 1.6 Liver activity: SUV max NA NECK: There is a large hypermetabolic nodal mass at the thoracic inlet on the right, measuring 3.2 x 3.2 cm on image 44/4. This has an SUV max of 12.1 and is new from the previous PET-CT. There are no other hypermetabolic cervical lymph nodes.There are no lesions of the pharyngeal mucosal space. Incidental CT findings: none CHEST: The large right supraclavicular node described above extends into the superior mediastinum at the level of the thyroid gland. No other hypermetabolic mediastinal or hilar lymph nodes are demonstrated. There is mild hypermetabolic activity associated with small axillary lymph nodes bilaterally (SUV max of 5.4 on the left). There are stable radiation changes in the right perihilar region without associated hypermetabolic activity. No suspicious pulmonary activity. Incidental CT findings: Emphysema, post treatment changes and scattered pulmonary ground-glass opacities are stable from the recent CT. ABDOMEN/PELVIS: There is no hypermetabolic activity within the liver, adrenal glands, spleen or pancreas. There is no suspicious hypermetabolic nodal activity. There are small inguinal lymph nodes bilaterally which demonstrate low-level hypermetabolic activity (up to SUV of 3.1 on the left). Incidental CT findings: Moderate diffuse aortic and branch vessel atherosclerosis.  Surgical clips in the pelvis. SKELETON: There is no hypermetabolic activity to suggest osseous metastatic disease. There is scattered arthropathic activity at the shoulders, elbows and wrists bilaterally. Incidental CT findings: none IMPRESSION: 1. The large right supraclavicular lymph node seen on recent chest CT is hypermetabolic, consistent with a nodal metastasis. This should be amenable to percutaneous biopsy if clinically warranted. 2. No other definite evidence of metastatic disease. Small axillary and inguinal lymph nodes bilaterally are mildly hypermetabolic, but likely reactive. 3. No residual activity in the right perihilar region to suggest local recurrence. Underlying radiation changes in this area are stable. Electronically Signed   By: Richardean Sale M.D.   On: 11/03/2018 08:19     ASSESSMENT/PLAN:  This is a very pleasant 63 year old African-American female who was diagnosed with stage IIIa non-small cell lung cancer, adenocarcinoma.  She presented with a large right lower lobe lung mass with extension into the right hilum and subcarinal area.  She was diagnosed in July 2019.    She underwent a course of concurrent chemoradiation with weekly carboplatin and paclitaxel.  She is status post 7 cycles with a partial response.  The patient tolerated this treatment well except for mild odynophagia and dysphasia secondary to radiation treatment.  The patient is recently undergoing consolidation immunotherapy with Imfinzi.  She is status post 18 cycles.  She tolerated treatment well without any adverse side effects except for arthralgias and myalgias. She had a CT scan performed which showed concerning findings of  superior mediastinal lymphadenopathy just lateral to the right lobe of the thyroid gland concerning for nodal metastasis.  There was also small  amount of pericardial fluid and thickening anteriorly with nodular appearance that is new compared to the prior study and also could be  concerning for pericardial metastasis. Her treatment is currently on hold until her condition can be further evaluated.  The patient recently had a PET scan performed.  Dr. Julien Nordmann personally independently reviewed the scan and discussed results with the patient today.  The scan showed a large right supraclavicular lymph node which was hypermetabolic. There was no other definite evidence of metastatic disease or local recurrence.   Dr. Julien Nordmann had a lengthy discussion with the patient about her current condition and treatment options.  Dr. Julien Nordmann recommends a referral to radiation oncology for consideration of radiation to this right hypermetabolic supraclavicular lymph node.  I will reach out to radiation oncology to arrange this.   We will continue to hold the patient's immunotherapy at this time.  We will see her back for a follow-up visit in 3 to 4 months with a repeat CT scan of the chest and neck.   The patient was advised to call immediately if she has any concerning symptoms in the interval. The patient voices understanding of current disease status and treatment options and is in agreement with the current care plan. All questions were answered. The patient knows to call the clinic with any problems, questions or concerns. We can certainly see the patient much sooner if necessary    Orders Placed This Encounter  Procedures  . CT Soft Tissue Neck W Contrast    Standing Status:   Future    Standing Expiration Date:   11/03/2019    Order Specific Question:   ** REASON FOR EXAM (FREE TEXT)    Answer:   Restaging Lung Cancer    Order Specific Question:   If indicated for the ordered procedure, I authorize the administration of contrast media per Radiology protocol    Answer:   Yes    Order Specific Question:   Preferred imaging location?    Answer:   [facility]    Order Specific Question:   Radiology Contrast Protocol - do NOT remove file path    Answer:    \\[email or web address].[email or web address]  . CT Chest W Contrast    Standing Status:   Future    Standing Expiration Date:   11/03/2019    Order Specific Question:   ** REASON FOR EXAM (FREE TEXT)    Answer:   Restaging Lung Cancer    Order Specific Question:   If indicated for the ordered procedure, I authorize the administration of contrast media per Radiology protocol    Answer:   Yes    Order Specific Question:   Preferred imaging location?    Answer:   [facility]    Order Specific Question:   Radiology Contrast Protocol - do NOT remove file path    Answer:   \\[email or web address]  . CBC with Differential ([place] Only)    Standing Status:   Future    Standing Expiration Date:   11/03/2019  . CMP ([place] only)    Standing Status:   Future    Standing Expiration Date:   11/03/2019  . Ambulatory referral to Radiation Oncology    Referral Priority:   Urgent    Referral Type:   Consultation    Referral Reason:   Specialty Services Required    Referred to Provider:   Kyung Rudd, MD    Requested Specialty:   Radiation Oncology    Number  of Visits Requested:   [place], PA-C 11/03/18  ADDENDUM: Hematology/Oncology Attending: I had a face-to-face encounter with the patient today.  I recommended her care plan.  This is a very pleasant 63 years old African-American female with a stage IIIa non-small cell lung cancer, adenocarcinoma status post a course of concurrent chemoradiation with weekly carboplatin and paclitaxel with partial response.  The patient was on treatment with consolidation immunotherapy with Imfinzi status post 18 cycles.  Repeat imaging studies after cycle #18 showed progressive lesion in the high right paratracheal region suspicious for metastatic disease. The patient had a PET scan performed recently that showed significant hypermetabolic activity in this lesion.  There was no other concerning findings  of metastasis. I personally and independently reviewed the scan images and discussed the result and showed the images to the patient today. I recommended for her to see Dr. Lisbeth Renshaw for consideration of palliative radiotherapy to this lesion. I will arrange for the patient to have repeat CT scan of the chest and neck in 3-4 months for restaging of her disease.  We will continue to hold her treatment with immunotherapy for now. She was advised to call immediately if she has any concerning symptoms in the interval.  Disclaimer: This note was dictated with voice recognition software. Similar sounding words can inadvertently be transcribed and may be missed upon review. Eilleen Kempf, MD 11/03/18

## 2018-11-03 NOTE — Telephone Encounter (Signed)
New message:   LVM for patient to return call to schedule appt from referral received

## 2018-11-04 ENCOUNTER — Ambulatory Visit
Admission: RE | Admit: 2018-11-04 | Discharge: 2018-11-04 | Disposition: A | Discharge status: Home / Self Care | Payer: Medicare Other | Source: Ambulatory Visit | Attending: Radiation Oncology | Admitting: Radiation Oncology

## 2018-11-04 ENCOUNTER — Telehealth: Payer: Self-pay | Admitting: Internal Medicine

## 2018-11-04 ENCOUNTER — Encounter: Payer: Self-pay | Admitting: Radiation Oncology

## 2018-11-04 VITALS — Wt 128.0 lb

## 2018-11-04 DIAGNOSIS — C3431 Malignant neoplasm of lower lobe, right bronchus or lung: Secondary | ICD-10-CM | POA: Insufficient documentation

## 2018-11-04 DIAGNOSIS — C770 Secondary and unspecified malignant neoplasm of lymph nodes of head, face and neck: Secondary | ICD-10-CM

## 2018-11-04 DIAGNOSIS — C3491 Malignant neoplasm of unspecified part of right bronchus or lung: Secondary | ICD-10-CM

## 2018-11-04 DIAGNOSIS — Z510 Encounter for antineoplastic radiation therapy: Secondary | ICD-10-CM | POA: Insufficient documentation

## 2018-11-04 HISTORY — DX: Secondary and unspecified malignant neoplasm of lymph nodes of head, face and neck: C77.0

## 2018-11-04 NOTE — Telephone Encounter (Signed)
Scheduled appt per 7/08 los - pt aware of appt date and time

## 2018-11-04 NOTE — Progress Notes (Deleted)
Office Visit Note  Patient: Susan Holder             Date of Birth: [birth year]           MRN: [id number]             PCP: Patient, No Pcp Per Referring: No ref. provider found Visit Date: 11/16/2018 Occupation: @GUAROCC @  Subjective:  No chief complaint on file.   History of Present Illness: Susan Holder is a 63 y.o. female ***   Activities of Daily Living:  Patient reports morning stiffness for *** {minute/hour:19697}.   Patient {ACTIONS;DENIES/REPORTS:21021675::"Denies"} nocturnal pain.  Difficulty dressing/grooming: {ACTIONS;DENIES/REPORTS:21021675::"Denies"} Difficulty climbing stairs: {ACTIONS;DENIES/REPORTS:21021675::"Denies"} Difficulty getting out of chair: {ACTIONS;DENIES/REPORTS:21021675::"Denies"} Difficulty using hands for taps, buttons, cutlery, and/or writing: {ACTIONS;DENIES/REPORTS:21021675::"Denies"}  No Rheumatology ROS completed.   PMFS History:  Patient Active Problem List   Diagnosis Date Noted   Acid reflux 09/21/2018   Hoarseness 03/16/2018   Lung cancer ([place]) 03/16/2018   Encounter for antineoplastic immunotherapy 02/01/2018   Primary malignant neoplasm of bronchus of right lower lobe ([place]) 12/21/2017   Adenocarcinoma of right lung, stage 3 ([place]) 11/17/2017   Encounter for antineoplastic chemotherapy 11/17/2017   Goals of care, counseling/discussion 11/17/2017   Lung mass    Left hand pain 03/06/2017   Rheumatoid arthritis involving multiple sites with positive rheumatoid factor ([place]) 05/29/2016   ANA positive 05/29/2016   Vitamin D deficiency 05/29/2016   High risk medication use 05/29/2016   Trigger finger, left ring finger 05/29/2016   Trigger finger, right ring finger 05/29/2016   DDD (degenerative disc disease), cervical 05/29/2016   Smoker 05/29/2016   Other fatigue 05/29/2016   Allergic rhinitis 08/22/2013    Past Medical History:  Diagnosis Date   Rheumatoid arthritis ([place])     Family History    Problem Relation Age of Onset   Stroke Mother    Alzheimer's disease Mother    Heart disease Mother    Emphysema Father    Hypertension Brother    Heart attack Maternal Aunt    Heart failure Maternal Grandmother    Hypertension Paternal Grandmother    Past Surgical History:  Procedure Laterality Date   BACK SURGERY     BRONCHIAL NEEDLE ASPIRATION BIOPSY  11/09/2017   Procedure: BRONCHIAL NEEDLE ASPIRATION BIOPSIES;  Surgeon: Marshell Garfinkel, MD;  Location: [facility];  Service: Cardiopulmonary;;   ENDOBRONCHIAL ULTRASOUND Bilateral 11/09/2017   Procedure: ENDOBRONCHIAL ULTRASOUND;  Surgeon: Marshell Garfinkel, MD;  Location: [facility];  Service: Cardiopulmonary;  Laterality: Bilateral;   NASAL SINUS SURGERY     NASAL SINUS SURGERY     TUBAL LIGATION     VIDEO BRONCHOSCOPY  11/09/2017   Procedure: VIDEO BRONCHOSCOPY;  Surgeon: Marshell Garfinkel, MD;  Location: [facility];  Service: Cardiopulmonary;;   Social History   Social History Narrative   Not on file   Immunization History  Administered Date(s) Administered   Influenza,inj,Quad PF,6+ Mos 03/08/2018     Objective: Vital Signs: LMP 04/28/2000    Physical Exam   Musculoskeletal Exam: ***  CDAI Exam: CDAI Score: -- Patient Global: --; Provider Global: -- Swollen: --; Tender: -- Joint Exam   No joint exam has been documented for this visit   There is currently no information documented on the homunculus. Go to the Rheumatology activity and complete the homunculus joint exam.  Investigation: No additional findings.  Imaging: Ct Chest W Contrast  Result Date: 10/19/2018 CLINICAL DATA:  63 year old female with history of right-sided lung cancer diagnosed  in June 2019 status post chemotherapy and radiation therapy complete in September 2019. Chronic cough. Follow-up study. EXAM: CT CHEST WITH CONTRAST TECHNIQUE: Multidetector CT imaging of the chest was performed during intravenous contrast  administration. CONTRAST:  71mL OMNIPAQUE IOHEXOL 300 MG/ML  SOLN COMPARISON:  Chest CT 07/23/2018. FINDINGS: Cardiovascular: Heart size is normal. Small amount of pericardial fluid and/or thickening, somewhat nodular in appearance, most evident anteriorly (axial image 90 of series 2) where this measures up to 1 cm in thickness, new compared to the prior examination. There is aortic atherosclerosis, as well as atherosclerosis of the great vessels of the mediastinum and the coronary arteries, including calcified atherosclerotic plaque in the left anterior descending and right coronary arteries. Mediastinum/Nodes: Superior mediastinal lymph node to the right of the proximal trachea and right lobe of the thyroid gland measuring 3.0 x 2.9 cm. This exerts local mass effect upon adjacent structures causing marked narrowing of the right superior vena cava and anterior displacement of the right common carotid artery. No other definite mediastinal or hilar lymphadenopathy. Esophagus is unremarkable in appearance. No axillary lymphadenopathy. Lungs/Pleura: Postprocedural changes of radiation therapy are again noted in the right hemithorax with significant volume loss throughout the majority of the right upper lobe and central aspect of the right lower lobe where there is extensive thickening of the peribronchovascular interstitium with regional architectural distortion and localized cylindrical bronchiectasis, most compatible with postradiation mass-like fibrosis. This is very similar to the prior study, without definitive evidence to suggest local recurrence of disease. A few scattered peripheral predominant ground-glass attenuation micro nodules are noted, nonspecific but statistically likely benign. No other new suspicious appearing pulmonary nodules or masses are noted. No acute consolidative airspace disease. Diffuse bronchial wall thickening with mild to moderate centrilobular and paraseptal emphysema. Upper Abdomen:  Aortic atherosclerosis. Musculoskeletal: In the anterolateral aspect of the right seventh rib (axial image 104 of series 5) there is a sclerotic lesion which appears similar to prior studies, not hypermetabolic on prior PET-CT 69/62/9528, favored to be benign. There are no other new aggressive appearing lytic or blastic lesions noted in the visualized portions of the skeleton. IMPRESSION: 1. Superior mediastinal lymphadenopathy the just lateral to the right lobe of the thyroid gland, concerning for nodal metastasis. 2. Stable postradiation changes in the right lung without evidence to suggest local recurrence of disease. 3. Small amount of pericardial fluid and/or thickening, most notably anteriorly which is somewhat nodular in appearance. This is new compared to the prior study. Although nonspecific, close attention on follow-up studies is recommended, as the possibility of pericardial metastasis is not excluded. 4. Multiple peripheral predominant ground-glass attenuation micronodules in the lungs bilaterally, nonspecific but statistically likely benign. Attention on follow-up studies is recommended to ensure resolution. 5. Diffuse bronchial wall thickening with mild to moderate centrilobular and paraseptal emphysema. 6. Aortic atherosclerosis, in addition to 2 vessel coronary artery disease. Please note that although the presence of coronary artery calcium documents the presence of coronary artery disease, the severity of this disease and any potential stenosis cannot be assessed on this non-gated CT examination. Assessment for potential risk factor modification, dietary therapy or pharmacologic therapy may be warranted, if clinically indicated. 7. Additional incidental findings, as above. These results were called by telephone at the time of interpretation on 10/19/2018 at 1:50 pm to Dr. Earlie Server, who verbally acknowledged these results. Aortic Atherosclerosis (ICD10-I70.0) and Emphysema (ICD10-J43.9).  Electronically Signed   By: Vinnie Langton M.D.   On: 10/19/2018 13:52   Nm Pet  Image Restag (ps) Skull Base To Thigh  Result Date: 11/03/2018 CLINICAL DATA:  Subsequent treatment strategy for right-sided lung cancer diagnosed in 2019 post chemotherapy and radiation therapy. Right superior mediastinal adenopathy on recent CT. EXAM: NUCLEAR MEDICINE PET SKULL BASE TO THIGH TECHNIQUE: 6.8 mCi F-18 FDG was injected intravenously. Full-ring PET imaging was performed from the skull base to thigh after the radiotracer. CT data was obtained and used for attenuation correction and anatomic localization. Fasting blood glucose: 79 mg/dl COMPARISON:  PET-CT 10/06/2017.  Chest CT 10/15/2018. FINDINGS: Mediastinal blood pool activity: SUV max 1.6 Liver activity: SUV max NA NECK: There is a large hypermetabolic nodal mass at the thoracic inlet on the right, measuring 3.2 x 3.2 cm on image 44/4. This has an SUV max of 12.1 and is new from the previous PET-CT. There are no other hypermetabolic cervical lymph nodes.There are no lesions of the pharyngeal mucosal space. Incidental CT findings: none CHEST: The large right supraclavicular node described above extends into the superior mediastinum at the level of the thyroid gland. No other hypermetabolic mediastinal or hilar lymph nodes are demonstrated. There is mild hypermetabolic activity associated with small axillary lymph nodes bilaterally (SUV max of 5.4 on the left). There are stable radiation changes in the right perihilar region without associated hypermetabolic activity. No suspicious pulmonary activity. Incidental CT findings: Emphysema, post treatment changes and scattered pulmonary ground-glass opacities are stable from the recent CT. ABDOMEN/PELVIS: There is no hypermetabolic activity within the liver, adrenal glands, spleen or pancreas. There is no suspicious hypermetabolic nodal activity. There are small inguinal lymph nodes bilaterally which demonstrate low-level  hypermetabolic activity (up to SUV of 3.1 on the left). Incidental CT findings: Moderate diffuse aortic and branch vessel atherosclerosis. Surgical clips in the pelvis. SKELETON: There is no hypermetabolic activity to suggest osseous metastatic disease. There is scattered arthropathic activity at the shoulders, elbows and wrists bilaterally. Incidental CT findings: none IMPRESSION: 1. The large right supraclavicular lymph node seen on recent chest CT is hypermetabolic, consistent with a nodal metastasis. This should be amenable to percutaneous biopsy if clinically warranted. 2. No other definite evidence of metastatic disease. Small axillary and inguinal lymph nodes bilaterally are mildly hypermetabolic, but likely reactive. 3. No residual activity in the right perihilar region to suggest local recurrence. Underlying radiation changes in this area are stable. Electronically Signed   By: Richardean Sale M.D.   On: 11/03/2018 08:19    Recent Labs: Lab Results  Component Value Date   WBC 6.9 10/19/2018   HGB 11.5 (L) 10/19/2018   PLT 354 10/19/2018   NA 138 10/19/2018   K 3.8 10/19/2018   CL 105 10/19/2018   CO2 24 10/19/2018   GLUCOSE 98 10/19/2018   BUN 13 10/19/2018   CREATININE 0.84 10/19/2018   BILITOT <0.2 (L) 10/19/2018   ALKPHOS 115 10/19/2018   AST 23 10/19/2018   ALT 14 10/19/2018   PROT 8.0 10/19/2018   ALBUMIN 3.4 (L) 10/19/2018   CALCIUM 9.7 10/19/2018   GFRAA >60 10/19/2018    Speciality Comments: PLQ eye exam: 04/29/2017 Normal. [facility]. Follow up in 12 months.  Procedures:  No procedures performed Allergies: Patient has no known allergies.   Assessment / Plan:     Visit Diagnoses: No diagnosis found.  Orders: No orders of the defined types were placed in this encounter.  No orders of the defined types were placed in this encounter.   Face-to-face time spent with patient was *** minutes. Greater  than 50% of time was spent in counseling and coordination of  care.  Follow-Up Instructions: No follow-ups on file.   Earnestine Mealing, CMA  Note - This record has been created using Editor, commissioning.  Chart creation errors have been sought, but may not always  have been located. Such creation errors do not reflect on  the standard of medical care.

## 2018-11-05 ENCOUNTER — Ambulatory Visit
Admission: RE | Admit: 2018-11-05 | Discharge: 2018-11-05 | Disposition: A | Discharge status: Home / Self Care | Payer: Medicare Other | Source: Ambulatory Visit | Attending: Radiation Oncology | Admitting: Radiation Oncology

## 2018-11-05 DIAGNOSIS — C770 Secondary and unspecified malignant neoplasm of lymph nodes of head, face and neck: Secondary | ICD-10-CM | POA: Diagnosis not present

## 2018-11-05 DIAGNOSIS — C3431 Malignant neoplasm of lower lobe, right bronchus or lung: Secondary | ICD-10-CM

## 2018-11-05 DIAGNOSIS — Z510 Encounter for antineoplastic radiation therapy: Secondary | ICD-10-CM | POA: Diagnosis not present

## 2018-11-08 ENCOUNTER — Telehealth: Payer: Self-pay | Admitting: Rheumatology

## 2018-11-08 ENCOUNTER — Ambulatory Visit: Payer: Medicare Other | Admitting: Rheumatology

## 2018-11-08 ENCOUNTER — Encounter: Payer: Self-pay | Admitting: Rheumatology

## 2018-11-08 ENCOUNTER — Other Ambulatory Visit: Payer: Self-pay

## 2018-11-08 VITALS — BP 110/68 | HR 100 | Resp 12 | Ht 60.0 in | Wt 129.6 lb

## 2018-11-08 DIAGNOSIS — C3491 Malignant neoplasm of unspecified part of right bronchus or lung: Secondary | ICD-10-CM

## 2018-11-08 DIAGNOSIS — M5030 Other cervical disc degeneration, unspecified cervical region: Secondary | ICD-10-CM

## 2018-11-08 DIAGNOSIS — Z79899 Other long term (current) drug therapy: Secondary | ICD-10-CM

## 2018-11-08 DIAGNOSIS — E559 Vitamin D deficiency, unspecified: Secondary | ICD-10-CM | POA: Diagnosis not present

## 2018-11-08 DIAGNOSIS — M0579 Rheumatoid arthritis with rheumatoid factor of multiple sites without organ or systems involvement: Secondary | ICD-10-CM

## 2018-11-08 DIAGNOSIS — M7021 Olecranon bursitis, right elbow: Secondary | ICD-10-CM

## 2018-11-08 DIAGNOSIS — G8929 Other chronic pain: Secondary | ICD-10-CM

## 2018-11-08 DIAGNOSIS — C3431 Malignant neoplasm of lower lobe, right bronchus or lung: Secondary | ICD-10-CM

## 2018-11-08 DIAGNOSIS — M25561 Pain in right knee: Secondary | ICD-10-CM

## 2018-11-08 MED ORDER — HYDROXYCHLOROQUINE SULFATE 200 MG PO TABS: ORAL_TABLET | ORAL | 0 refills | Status: DC

## 2018-11-08 NOTE — Progress Notes (Signed)
Office Visit Note  Patient: Susan Holder             Date of Birth: 1955/07/30           MRN: [id number]             PCP: Patient, No Pcp Per Referring: No ref. provider found Visit Date: 11/08/2018 Occupation: @GUAROCC @  Subjective:  Right elbow joint pain   History of Present Illness: Susan Holder is a 63 y.o. female with history of seropositive rheumatoid arthritis and DDD.  She is taking Plaquenil 200 mg 1 tablet BID M-F.  She presents today with right elbow joint pain and swelling.  She states that the inflammation started on Friday.  She denies any overuse activities, injuries, or falls.  She has been applying heat as needed.  She reports he continues to have chronic pain in bilateral shoulder joints, bilateral knee joints, bilateral ankle joints.  She states that her knees and ankles swell occasionally. She is aware she needs to update her PLQ eye exam, but she reports her ophthalmologists is not seeing patients in the office yet.      Activities of Daily Living:  Patient reports joint stiffness all day  Patient Reports nocturnal pain.  Difficulty dressing/grooming: Denies Difficulty climbing stairs: Reports Difficulty getting out of chair: Denies Difficulty using hands for taps, buttons, cutlery, and/or writing: Reports  Review of Systems  Constitutional: Positive for fatigue.  HENT: Negative for mouth sores, mouth dryness and nose dryness.   Eyes: Negative for pain, visual disturbance and dryness.  Respiratory: Negative for cough, hemoptysis, shortness of breath and difficulty breathing.   Cardiovascular: Negative for chest pain, palpitations, hypertension and swelling in legs/feet.  Gastrointestinal: Negative for blood in stool, constipation and diarrhea.  Endocrine: Negative for increased urination.  Genitourinary: Negative for painful urination.  Musculoskeletal: Positive for arthralgias, joint pain, joint swelling and morning stiffness. Negative for myalgias,  muscle weakness, muscle tenderness and myalgias.  Skin: Negative for color change, pallor, rash, hair loss, nodules/bumps, skin tightness, ulcers and sensitivity to sunlight.  Allergic/Immunologic: Negative for susceptible to infections.  Neurological: Negative for dizziness, numbness, headaches and weakness.  Hematological: Negative for swollen glands.  Psychiatric/Behavioral: Negative for depressed mood and sleep disturbance. The patient is not nervous/anxious.     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[place]) 11/04/2018   Acid reflux 09/21/2018   Hoarseness 03/16/2018   Lung cancer ([place]) 03/16/2018   Encounter for antineoplastic immunotherapy 02/01/2018   Primary malignant neoplasm of bronchus of right lower lobe ([place]) 12/21/2017   Adenocarcinoma of right lung, stage 3 ([place]) 11/17/2017   Encounter for antineoplastic chemotherapy 11/17/2017   Goals of care, counseling/discussion 11/17/2017   Lung mass    Left hand pain 03/06/2017   Rheumatoid arthritis involving multiple sites with positive rheumatoid factor ([place]) 05/29/2016   ANA positive 05/29/2016   Vitamin D deficiency 05/29/2016   High risk medication use 05/29/2016   Trigger finger, left ring finger 05/29/2016   Trigger finger, right ring finger 05/29/2016   DDD (degenerative disc disease), cervical 05/29/2016   Smoker 05/29/2016   Other fatigue 05/29/2016   Allergic rhinitis 08/22/2013    Past Medical History:  Diagnosis Date   Rheumatoid arthritis ([place])     Family History  Problem Relation Age of Onset   Stroke Mother    Alzheimer's disease Mother    Heart disease Mother  Emphysema Father    Hypertension Brother    Heart attack Maternal Aunt    Heart failure Maternal Grandmother    Hypertension Paternal Grandmother    Past Surgical History:  Procedure Laterality Date   BACK SURGERY     BRONCHIAL NEEDLE  ASPIRATION BIOPSY  11/09/2017   Procedure: BRONCHIAL NEEDLE ASPIRATION BIOPSIES;  Surgeon: Marshell Garfinkel, MD;  Location: [facility];  Service: Cardiopulmonary;;   ENDOBRONCHIAL ULTRASOUND Bilateral 11/09/2017   Procedure: ENDOBRONCHIAL ULTRASOUND;  Surgeon: Marshell Garfinkel, MD;  Location: [facility];  Service: Cardiopulmonary;  Laterality: Bilateral;   NASAL SINUS SURGERY     NASAL SINUS SURGERY     TUBAL LIGATION     VIDEO BRONCHOSCOPY  11/09/2017   Procedure: VIDEO BRONCHOSCOPY;  Surgeon: Marshell Garfinkel, MD;  Location: [facility];  Service: Cardiopulmonary;;   Social History   Social History Narrative   Not on file   Immunization History  Administered Date(s) Administered   Influenza,inj,Quad PF,6+ Mos 03/08/2018     Objective: Vital Signs: BP 110/68 (BP Location: Left Arm, Patient Position: Sitting, Cuff Size: Normal)    Pulse 100    Resp 12    Ht 5' (1.524 m)    Wt 129 lb 9.6 oz (58.8 kg)    LMP 04/28/2000    BMI 25.31 kg/m    Physical Exam Vitals signs and nursing note reviewed.  Constitutional:      Appearance: She is well-developed.  HENT:     Head: Normocephalic and atraumatic.  Eyes:     Conjunctiva/sclera: Conjunctivae normal.  Neck:     Musculoskeletal: Normal range of motion.  Cardiovascular:     Rate and Rhythm: Normal rate and regular rhythm.     Heart sounds: Normal heart sounds.  Pulmonary:     Effort: Pulmonary effort is normal.     Breath sounds: Normal breath sounds.  Abdominal:     General: Bowel sounds are normal.     Palpations: Abdomen is soft.  Lymphadenopathy:     Cervical: No cervical adenopathy.  Skin:    General: Skin is warm and dry.     Capillary Refill: Capillary refill takes less than 2 seconds.  Neurological:     Mental Status: She is alert and oriented to person, place, and time.  Psychiatric:        Behavior: Behavior normal.      Musculoskeletal Exam: C-spine limited range of motion.  Thoracic and lumbar spine  good range of motion.  Shoulder joint abduction to about 30 degrees bilaterally.  Right elbow joint olecranon bursitis.  Left elbow has full ROM with no tenderness or inflammation. Wrist joints, MCPs, PIPs, and DIPs good ROM with no synovitis.  Hip joints good ROM.  Right knee full ROM with warmth.  Left knee has good ROM with no warmth or effusion.  Ankle joints have good ROM with some tenderness but no obvious inflammation.    CDAI Exam: CDAI Score: 6.3  Patient Global: 0 mm; Provider Global: 3 mm Swollen: 2 ; Tender: 4  Joint Exam      Right  Left  Glenohumeral   Tender   Tender  Elbow  Swollen Tender     Knee  Swollen Tender        Investigation: No additional findings.  Imaging: Ct Chest W Contrast  Result Date: 10/19/2018 CLINICAL DATA:  63 year old female with history of right-sided lung cancer diagnosed in June 2019 status post chemotherapy and radiation therapy complete in September 2019. Chronic cough.  Follow-up study. EXAM: CT CHEST WITH CONTRAST TECHNIQUE: Multidetector CT imaging of the chest was performed during intravenous contrast administration. CONTRAST:  17mL OMNIPAQUE IOHEXOL 300 MG/ML  SOLN COMPARISON:  Chest CT 07/23/2018. FINDINGS: Cardiovascular: Heart size is normal. Small amount of pericardial fluid and/or thickening, somewhat nodular in appearance, most evident anteriorly (axial image 90 of series 2) where this measures up to 1 cm in thickness, new compared to the prior examination. There is aortic atherosclerosis, as well as atherosclerosis of the great vessels of the mediastinum and the coronary arteries, including calcified atherosclerotic plaque in the left anterior descending and right coronary arteries. Mediastinum/Nodes: Superior mediastinal lymph node to the right of the proximal trachea and right lobe of the thyroid gland measuring 3.0 x 2.9 cm. This exerts local mass effect upon adjacent structures causing marked narrowing of the right superior vena cava and  anterior displacement of the right common carotid artery. No other definite mediastinal or hilar lymphadenopathy. Esophagus is unremarkable in appearance. No axillary lymphadenopathy. Lungs/Pleura: Postprocedural changes of radiation therapy are again noted in the right hemithorax with significant volume loss throughout the majority of the right upper lobe and central aspect of the right lower lobe where there is extensive thickening of the peribronchovascular interstitium with regional architectural distortion and localized cylindrical bronchiectasis, most compatible with postradiation mass-like fibrosis. This is very similar to the prior study, without definitive evidence to suggest local recurrence of disease. A few scattered peripheral predominant ground-glass attenuation micro nodules are noted, nonspecific but statistically likely benign. No other new suspicious appearing pulmonary nodules or masses are noted. No acute consolidative airspace disease. Diffuse bronchial wall thickening with mild to moderate centrilobular and paraseptal emphysema. Upper Abdomen: Aortic atherosclerosis. Musculoskeletal: In the anterolateral aspect of the right seventh rib (axial image 104 of series 5) there is a sclerotic lesion which appears similar to prior studies, not hypermetabolic on prior PET-CT 05/39/7673, favored to be benign. There are no other new aggressive appearing lytic or blastic lesions noted in the visualized portions of the skeleton. IMPRESSION: 1. Superior mediastinal lymphadenopathy the just lateral to the right lobe of the thyroid gland, concerning for nodal metastasis. 2. Stable postradiation changes in the right lung without evidence to suggest local recurrence of disease. 3. Small amount of pericardial fluid and/or thickening, most notably anteriorly which is somewhat nodular in appearance. This is new compared to the prior study. Although nonspecific, close attention on follow-up studies is recommended,  as the possibility of pericardial metastasis is not excluded. 4. Multiple peripheral predominant ground-glass attenuation micronodules in the lungs bilaterally, nonspecific but statistically likely benign. Attention on follow-up studies is recommended to ensure resolution. 5. Diffuse bronchial wall thickening with mild to moderate centrilobular and paraseptal emphysema. 6. Aortic atherosclerosis, in addition to 2 vessel coronary artery disease. Please note that although the presence of coronary artery calcium documents the presence of coronary artery disease, the severity of this disease and any potential stenosis cannot be assessed on this non-gated CT examination. Assessment for potential risk factor modification, dietary therapy or pharmacologic therapy may be warranted, if clinically indicated. 7. Additional incidental findings, as above. These results were called by telephone at the time of interpretation on 10/19/2018 at 1:50 pm to Dr. Earlie Server, who verbally acknowledged these results. Aortic Atherosclerosis (ICD10-I70.0) and Emphysema (ICD10-J43.9). Electronically Signed   By: Vinnie Langton M.D.   On: 10/19/2018 13:52   Nm Pet Image Restag (ps) Skull Base To Thigh  Result Date: 11/03/2018 CLINICAL DATA:  Subsequent treatment  strategy for right-sided lung cancer diagnosed in 2019 post chemotherapy and radiation therapy. Right superior mediastinal adenopathy on recent CT. EXAM: NUCLEAR MEDICINE PET SKULL BASE TO THIGH TECHNIQUE: 6.8 mCi F-18 FDG was injected intravenously. Full-ring PET imaging was performed from the skull base to thigh after the radiotracer. CT data was obtained and used for attenuation correction and anatomic localization. Fasting blood glucose: 79 mg/dl COMPARISON:  PET-CT 10/06/2017.  Chest CT 10/15/2018. FINDINGS: Mediastinal blood pool activity: SUV max 1.6 Liver activity: SUV max NA NECK: There is a large hypermetabolic nodal mass at the thoracic inlet on the right, measuring 3.2 x  3.2 cm on image 44/4. This has an SUV max of 12.1 and is new from the previous PET-CT. There are no other hypermetabolic cervical lymph nodes.There are no lesions of the pharyngeal mucosal space. Incidental CT findings: none CHEST: The large right supraclavicular node described above extends into the superior mediastinum at the level of the thyroid gland. No other hypermetabolic mediastinal or hilar lymph nodes are demonstrated. There is mild hypermetabolic activity associated with small axillary lymph nodes bilaterally (SUV max of 5.4 on the left). There are stable radiation changes in the right perihilar region without associated hypermetabolic activity. No suspicious pulmonary activity. Incidental CT findings: Emphysema, post treatment changes and scattered pulmonary ground-glass opacities are stable from the recent CT. ABDOMEN/PELVIS: There is no hypermetabolic activity within the liver, adrenal glands, spleen or pancreas. There is no suspicious hypermetabolic nodal activity. There are small inguinal lymph nodes bilaterally which demonstrate low-level hypermetabolic activity (up to SUV of 3.1 on the left). Incidental CT findings: Moderate diffuse aortic and branch vessel atherosclerosis. Surgical clips in the pelvis. SKELETON: There is no hypermetabolic activity to suggest osseous metastatic disease. There is scattered arthropathic activity at the shoulders, elbows and wrists bilaterally. Incidental CT findings: none IMPRESSION: 1. The large right supraclavicular lymph node seen on recent chest CT is hypermetabolic, consistent with a nodal metastasis. This should be amenable to percutaneous biopsy if clinically warranted. 2. No other definite evidence of metastatic disease. Small axillary and inguinal lymph nodes bilaterally are mildly hypermetabolic, but likely reactive. 3. No residual activity in the right perihilar region to suggest local recurrence. Underlying radiation changes in this area are stable.  Electronically Signed   By: Richardean Sale M.D.   On: 11/03/2018 08:19    Recent Labs: Lab Results  Component Value Date   WBC 6.9 10/19/2018   HGB 11.5 (L) 10/19/2018   PLT 354 10/19/2018   NA 138 10/19/2018   K 3.8 10/19/2018   CL 105 10/19/2018   CO2 24 10/19/2018   GLUCOSE 98 10/19/2018   BUN 13 10/19/2018   CREATININE 0.84 10/19/2018   BILITOT <0.2 (L) 10/19/2018   ALKPHOS 115 10/19/2018   AST 23 10/19/2018   ALT 14 10/19/2018   PROT 8.0 10/19/2018   ALBUMIN 3.4 (L) 10/19/2018   CALCIUM 9.7 10/19/2018   GFRAA >60 10/19/2018    Speciality Comments: PLQ eye exam: 04/29/2017 Normal. [facility]. Follow up in 12 months.  Procedures:  Large Joint Inj: R knee on 11/08/2018 11:20 AM Indications: pain Details: 27 G 1.5 in needle, medial approach  Arthrogram: No  Medications: 1.5 mL lidocaine 1 %; 40 mg triamcinolone acetonide 40 MG/ML Aspirate: 0 mL Outcome: tolerated well, no immediate complications Procedure, treatment alternatives, risks and benefits explained, specific risks discussed. Consent was given by the patient. Immediately prior to procedure a time out was called to verify the correct patient,  procedure, equipment, support staff and site/side marked as required. Patient was prepped and draped in the usual sterile fashion.   Medium Joint Inj: R olecranon bursa on 11/08/2018 11:25 AM Indications: pain Details: 25 G 1.5 in needle, posterior approach Medications: 1 mL lidocaine 1 %; 20 mg triamcinolone acetonide 40 MG/ML Aspirate: 2.5 mL clear; sent for lab analysis Outcome: tolerated well, no immediate complications Procedure, treatment alternatives, risks and benefits explained, specific risks discussed. Consent was given by the patient. Immediately prior to procedure a time out was called to verify the correct patient, procedure, equipment, support staff and site/side marked as required. Patient was prepped and draped in the usual sterile fashion.      Allergies: Patient has no known allergies.   Assessment / Plan:     Visit Diagnoses: Rheumatoid arthritis involving multiple sites with positive rheumatoid factor (HCC) - +RF, +anti-CCP, +ANA with nodulosis: She presents today with right olecranon bursitis and warmth in the right knee joint.  She is having increased pain in bilateral shoulder joints, bilateral knee joints, and bilateral ankle joints.  She has limited abduction of bilateral shoulder joints to 30 degrees.  She had x-ray of the right shoulder on 04/06/2018 that was unremarkable.  The right liquid on bursa was aspirated today and 2-1/2 mL of fluid was drawn off.  Fluid was sent for analysis.  She had a right knee joint cortisone injection performed as well.  She tolerated both the procedure well and procedure note was completed above.  She will continue taking Plaquenil 200 mg 1 tablet twice daily Monday through Friday.  She does not want any more aggressive treatment at this time.  She is about to undergo radiation therapy.  She will follow-up in the office in 2 months.  At that time we may perform bilateral shoulder joint cortisone injections.  She is advised to notify us that shows increased joint pain or joint swelling.- Plan: hydroxychloroquine (PLAQUENIL) 200 MG tablet  High risk medication use - PLQ 200 mg BID M-F.  [facility].  She is aware that she is due for her Plaquenil eye exam but states that her ophthalmologist is not seeing patients in the office currently.  She was given a Plaquenil eye exam form today in the office.  She had CBC and CMP drawn on 10/19/2018.  DDD (degenerative disc disease), cervical -she has some limited range of motion with discomfort.  She is no symptoms of radiculopathy at this time.  Olecranon bursitis, right elbow -she presents today with right olecranon bursitis.  She has tenderness and inflammation since Friday.  She has not had any injuries or  overuse activities.  The right olecranon bursa was aspirated and 2.5 mL of fluid was drawn off and sent for analysis.  Cortisone injection was also performed.  A pressure dressing was applied.  Aftercare was discussed.  Plan: Synovial cell count + diff, w/ crystals, Anaerobic and Aerobic Culture  Chronic pain of right knee -She presents today with right knee joint pain and warmth.  No effusion was noted.  She requested a right knee cortisone injection.  She tolerated procedure well.  Procedure note was completed above.  Vitamin D deficiency -She requested to have her vitamin D level checked.  She is taking vitamin D 2,000 units by mouth daily. Plan: VITAMIN D 25 Hydroxy (Vit-D Deficiency, Fractures)  Primary malignant neoplasm of bronchus of right lower lobe (HCC) - She recently completed chemotherapy and  she will be starting radiation therapy soon.   Adenocarcinoma of right lung, stage 3 (HCC)    Orders: Orders Placed This Encounter  Procedures   Large Joint Inj   Medium Joint Inj   Anaerobic and Aerobic Culture   Synovial cell count + diff, w/ crystals   VITAMIN D 25 Hydroxy (Vit-D Deficiency, Fractures)   Meds ordered this encounter  Medications   hydroxychloroquine (PLAQUENIL) 200 MG tablet    Sig: Take 1 tablet by mouth twice daily Monday through Friday    Dispense:  120 tablet    Refill:  0    Face-to-face time spent with patient was 30 minutes. Greater than 50% of time was spent in counseling and coordination of care.  Follow-Up Instructions: Return in about 2 months (around 01/09/2019) for Rheumatoid arthritis, DDD.   Ofilia Neas, PA-C   I examined and evaluated the patient with Hazel Sams PA.  Patient continues to have active disease with pain and inflammation in multiple joints.  Due to lung cancer in the chemotherapy we could not give her any aggressive therapy.  She has been on Plaquenil which is not controlling her symptoms.  She will be starting radiation  therapy for the next 5 weeks.  I would like permission of her oncologist and discuss plan about future therapy.  She might be to use sulfasalazine in combination to Plaquenil.  I am uncertain if methotrexate Areva can be used.  Frozen shoulders today.  I will obtain x-rays at the follow-up visit.  The plan of care was discussed as noted above.  Bo Merino, MD Note - This record has been created using Editor, commissioning.  Chart creation errors have been sought, but may not always  have been located. Such creation errors do not reflect on  the standard of medical care.

## 2018-11-08 NOTE — Telephone Encounter (Signed)
Patient called requesting prescription refill of Plaquenil to be sent to [facility] at [facility].    Patient states she is also having pain in her shoulders and her knees are swollen.  Patient is scheduled for an appointment on 11/16/18, but requested a return call to see if Dr. Estanislado Pandy recommends her being seen earlier.

## 2018-11-08 NOTE — Telephone Encounter (Signed)
Patient states she has severe pain is in her shoulders ans ankles. Patient states she has a knot in her right elbow which is tender to the touch. Patient states that she noticed it on Friday evening. Patient states she has she has noticed swelling in her knees and her ankles,. Patient states she has little range of motion with her shoulders. Patient's appointment moved from 11/16/18 to today  At 10:30 am. Prescription for PLQ pended for today appointment

## 2018-11-09 LAB — VITAMIN D 25 HYDROXY (VIT D DEFICIENCY, FRACTURES): Vit D, 25-Hydroxy: 39 ng/mL (ref 30–100)

## 2018-11-09 NOTE — Progress Notes (Signed)
Vitamin D is 39, which is within desirable range.  Please recommend a maintenance dose of vitamin D.

## 2018-11-11 NOTE — Progress Notes (Signed)
Synovial fluid is negative for crystals.  White cell count shows inflammation.  Culture was negative.

## 2018-11-12 DIAGNOSIS — C3431 Malignant neoplasm of lower lobe, right bronchus or lung: Secondary | ICD-10-CM | POA: Diagnosis not present

## 2018-11-14 LAB — ANAEROBIC AND AEROBIC CULTURE
GRAM STAIN:: NONE SEEN
MICRO NUMBER:: 663283
MICRO NUMBER:: 663284
SPECIMEN QUALITY:: ADEQUATE
SPECIMEN QUALITY:: ADEQUATE

## 2018-11-14 LAB — SYNOVIAL CELL COUNT + DIFF, W/ CRYSTALS
Basophils, %: 0 %
Eosinophils-Synovial: 0 % (ref 0–2)
Lymphocytes-Synovial Fld: 74 % (ref 0–74)
Monocyte/Macrophage: 24 % (ref 0–69)
Neutrophil, Synovial: 2 % (ref 0–24)
Synoviocytes, %: 0 % (ref 0–15)
WBC, Synovial: 911 cells/uL — ABNORMAL HIGH (ref <150)

## 2018-11-15 ENCOUNTER — Other Ambulatory Visit: Payer: Medicare Other

## 2018-11-15 ENCOUNTER — Ambulatory Visit: Payer: Medicare Other

## 2018-11-15 ENCOUNTER — Ambulatory Visit: Payer: Medicare Other | Admitting: Internal Medicine

## 2018-11-16 ENCOUNTER — Ambulatory Visit
Admission: RE | Admit: 2018-11-16 | Discharge: 2018-11-16 | Disposition: A | Discharge status: Home / Self Care | Payer: Medicare Other | Source: Ambulatory Visit | Attending: Radiation Oncology | Admitting: Radiation Oncology

## 2018-11-16 ENCOUNTER — Ambulatory Visit: Payer: Self-pay | Admitting: Physician Assistant

## 2018-11-16 ENCOUNTER — Other Ambulatory Visit: Payer: Self-pay

## 2018-11-16 DIAGNOSIS — C3431 Malignant neoplasm of lower lobe, right bronchus or lung: Secondary | ICD-10-CM | POA: Diagnosis not present

## 2018-11-17 ENCOUNTER — Ambulatory Visit
Admission: RE | Admit: 2018-11-17 | Discharge: 2018-11-17 | Disposition: A | Discharge status: Home / Self Care | Payer: Medicare Other | Source: Ambulatory Visit | Attending: Radiation Oncology | Admitting: Radiation Oncology

## 2018-11-17 ENCOUNTER — Other Ambulatory Visit: Payer: Self-pay

## 2018-11-17 DIAGNOSIS — C3431 Malignant neoplasm of lower lobe, right bronchus or lung: Secondary | ICD-10-CM | POA: Diagnosis not present

## 2018-11-18 ENCOUNTER — Ambulatory Visit
Admission: RE | Admit: 2018-11-18 | Discharge: 2018-11-18 | Disposition: A | Discharge status: Home / Self Care | Payer: Medicare Other | Source: Ambulatory Visit | Attending: Radiation Oncology | Admitting: Radiation Oncology

## 2018-11-18 ENCOUNTER — Other Ambulatory Visit: Payer: Self-pay

## 2018-11-18 DIAGNOSIS — C3431 Malignant neoplasm of lower lobe, right bronchus or lung: Secondary | ICD-10-CM | POA: Diagnosis not present

## 2018-11-19 ENCOUNTER — Other Ambulatory Visit: Payer: Self-pay

## 2018-11-19 ENCOUNTER — Ambulatory Visit
Admission: RE | Admit: 2018-11-19 | Discharge: 2018-11-19 | Disposition: A | Discharge status: Home / Self Care | Payer: Medicare Other | Source: Ambulatory Visit | Attending: Radiation Oncology | Admitting: Radiation Oncology

## 2018-11-19 DIAGNOSIS — C770 Secondary and unspecified malignant neoplasm of lymph nodes of head, face and neck: Secondary | ICD-10-CM

## 2018-11-19 DIAGNOSIS — C3431 Malignant neoplasm of lower lobe, right bronchus or lung: Secondary | ICD-10-CM | POA: Diagnosis not present

## 2018-11-19 MED ORDER — SONAFINE EX EMUL
1.0000 "application " | Freq: Once | CUTANEOUS | Status: AC
  Administered 2018-11-19: 1 via TOPICAL

## 2018-11-19 NOTE — Progress Notes (Signed)
Pt here for patient teaching.  Reviewed areas of pertinence such as fatigue, hair loss, skin changes and throat changes . Pt able to give teach back of to pat skin, use unscented/gentle soap and drink plenty of water,apply Sonafine bid and avoid applying anything to skin within 4 hours of treatment. Pt verbalized understanding of information given and will contact nursing with any questions or concerns.    Gloriajean Dell. Leonie Green, BSN

## 2018-11-22 ENCOUNTER — Ambulatory Visit
Admission: RE | Admit: 2018-11-22 | Discharge: 2018-11-22 | Disposition: A | Discharge status: Home / Self Care | Payer: Medicare Other | Source: Ambulatory Visit | Attending: Radiation Oncology | Admitting: Radiation Oncology

## 2018-11-22 ENCOUNTER — Other Ambulatory Visit: Payer: Self-pay

## 2018-11-22 DIAGNOSIS — C3431 Malignant neoplasm of lower lobe, right bronchus or lung: Secondary | ICD-10-CM | POA: Diagnosis not present

## 2018-11-23 ENCOUNTER — Other Ambulatory Visit: Payer: Self-pay

## 2018-11-23 ENCOUNTER — Ambulatory Visit
Admission: RE | Admit: 2018-11-23 | Discharge: 2018-11-23 | Disposition: A | Discharge status: Home / Self Care | Payer: Medicare Other | Source: Ambulatory Visit | Attending: Radiation Oncology | Admitting: Radiation Oncology

## 2018-11-23 DIAGNOSIS — C3431 Malignant neoplasm of lower lobe, right bronchus or lung: Secondary | ICD-10-CM | POA: Diagnosis not present

## 2018-11-24 ENCOUNTER — Ambulatory Visit
Admission: RE | Admit: 2018-11-24 | Discharge: 2018-11-24 | Disposition: A | Discharge status: Home / Self Care | Payer: Medicare Other | Source: Ambulatory Visit | Attending: Radiation Oncology | Admitting: Radiation Oncology

## 2018-11-24 ENCOUNTER — Other Ambulatory Visit: Payer: Self-pay

## 2018-11-24 DIAGNOSIS — C3431 Malignant neoplasm of lower lobe, right bronchus or lung: Secondary | ICD-10-CM | POA: Diagnosis not present

## 2018-11-25 ENCOUNTER — Other Ambulatory Visit: Payer: Self-pay

## 2018-11-25 ENCOUNTER — Ambulatory Visit
Admission: RE | Admit: 2018-11-25 | Discharge: 2018-11-25 | Disposition: A | Discharge status: Home / Self Care | Payer: Medicare Other | Source: Ambulatory Visit | Attending: Radiation Oncology | Admitting: Radiation Oncology

## 2018-11-25 DIAGNOSIS — C3431 Malignant neoplasm of lower lobe, right bronchus or lung: Secondary | ICD-10-CM | POA: Diagnosis not present

## 2018-11-26 ENCOUNTER — Other Ambulatory Visit: Payer: Self-pay

## 2018-11-26 ENCOUNTER — Ambulatory Visit
Admission: RE | Admit: 2018-11-26 | Discharge: 2018-11-26 | Disposition: A | Discharge status: Home / Self Care | Payer: Medicare Other | Source: Ambulatory Visit | Attending: Radiation Oncology | Admitting: Radiation Oncology

## 2018-11-26 DIAGNOSIS — C3431 Malignant neoplasm of lower lobe, right bronchus or lung: Secondary | ICD-10-CM | POA: Diagnosis not present

## 2018-11-29 ENCOUNTER — Other Ambulatory Visit: Payer: Self-pay

## 2018-11-29 ENCOUNTER — Ambulatory Visit: Payer: Medicare Other | Admitting: Internal Medicine

## 2018-11-29 ENCOUNTER — Ambulatory Visit
Admission: RE | Admit: 2018-11-29 | Discharge: 2018-11-29 | Disposition: A | Discharge status: Home / Self Care | Payer: Medicare Other | Source: Ambulatory Visit | Attending: Radiation Oncology | Admitting: Radiation Oncology

## 2018-11-29 ENCOUNTER — Ambulatory Visit: Payer: Medicare Other

## 2018-11-29 ENCOUNTER — Other Ambulatory Visit: Payer: Medicare Other

## 2018-11-29 DIAGNOSIS — Z510 Encounter for antineoplastic radiation therapy: Secondary | ICD-10-CM | POA: Diagnosis not present

## 2018-11-29 DIAGNOSIS — C770 Secondary and unspecified malignant neoplasm of lymph nodes of head, face and neck: Secondary | ICD-10-CM | POA: Insufficient documentation

## 2018-11-29 DIAGNOSIS — C3431 Malignant neoplasm of lower lobe, right bronchus or lung: Secondary | ICD-10-CM | POA: Insufficient documentation

## 2018-11-30 ENCOUNTER — Ambulatory Visit
Admission: RE | Admit: 2018-11-30 | Discharge: 2018-11-30 | Disposition: A | Discharge status: Home / Self Care | Payer: Medicare Other | Source: Ambulatory Visit | Attending: Radiation Oncology | Admitting: Radiation Oncology

## 2018-11-30 ENCOUNTER — Other Ambulatory Visit: Payer: Self-pay

## 2018-11-30 DIAGNOSIS — C3431 Malignant neoplasm of lower lobe, right bronchus or lung: Secondary | ICD-10-CM | POA: Diagnosis not present

## 2018-12-01 ENCOUNTER — Other Ambulatory Visit: Payer: Self-pay

## 2018-12-01 ENCOUNTER — Ambulatory Visit
Admission: RE | Admit: 2018-12-01 | Discharge: 2018-12-01 | Disposition: A | Discharge status: Home / Self Care | Payer: Medicare Other | Source: Ambulatory Visit | Attending: Radiation Oncology | Admitting: Radiation Oncology

## 2018-12-01 DIAGNOSIS — C3431 Malignant neoplasm of lower lobe, right bronchus or lung: Secondary | ICD-10-CM | POA: Diagnosis not present

## 2018-12-02 ENCOUNTER — Ambulatory Visit
Admission: RE | Admit: 2018-12-02 | Discharge: 2018-12-02 | Disposition: A | Discharge status: Home / Self Care | Payer: Medicare Other | Source: Ambulatory Visit | Attending: Radiation Oncology | Admitting: Radiation Oncology

## 2018-12-02 ENCOUNTER — Other Ambulatory Visit: Payer: Self-pay

## 2018-12-02 DIAGNOSIS — C3431 Malignant neoplasm of lower lobe, right bronchus or lung: Secondary | ICD-10-CM | POA: Diagnosis not present

## 2018-12-03 ENCOUNTER — Other Ambulatory Visit: Payer: Self-pay

## 2018-12-03 ENCOUNTER — Other Ambulatory Visit: Payer: Self-pay | Admitting: Radiation Oncology

## 2018-12-03 ENCOUNTER — Ambulatory Visit
Admission: RE | Admit: 2018-12-03 | Discharge: 2018-12-03 | Disposition: A | Discharge status: Home / Self Care | Payer: Medicare Other | Source: Ambulatory Visit | Attending: Radiation Oncology | Admitting: Radiation Oncology

## 2018-12-03 DIAGNOSIS — C3431 Malignant neoplasm of lower lobe, right bronchus or lung: Secondary | ICD-10-CM | POA: Diagnosis not present

## 2018-12-03 MED ORDER — LIDOCAINE VISCOUS HCL 2 % MT SOLN: 10.0000 mL | Freq: Four times a day (QID) | OROMUCOSAL | 1 refills | Status: DC | PRN

## 2018-12-06 ENCOUNTER — Ambulatory Visit
Admission: RE | Admit: 2018-12-06 | Discharge: 2018-12-06 | Disposition: A | Discharge status: Home / Self Care | Payer: Medicare Other | Source: Ambulatory Visit | Attending: Radiation Oncology | Admitting: Radiation Oncology

## 2018-12-06 ENCOUNTER — Other Ambulatory Visit: Payer: Self-pay

## 2018-12-06 DIAGNOSIS — C3431 Malignant neoplasm of lower lobe, right bronchus or lung: Secondary | ICD-10-CM | POA: Diagnosis not present

## 2018-12-07 ENCOUNTER — Ambulatory Visit
Admission: RE | Admit: 2018-12-07 | Discharge: 2018-12-07 | Disposition: A | Discharge status: Home / Self Care | Payer: Medicare Other | Source: Ambulatory Visit | Attending: Radiation Oncology | Admitting: Radiation Oncology

## 2018-12-07 ENCOUNTER — Other Ambulatory Visit: Payer: Self-pay

## 2018-12-07 DIAGNOSIS — C3431 Malignant neoplasm of lower lobe, right bronchus or lung: Secondary | ICD-10-CM | POA: Diagnosis not present

## 2018-12-08 ENCOUNTER — Ambulatory Visit
Admission: RE | Admit: 2018-12-08 | Discharge: 2018-12-08 | Disposition: A | Discharge status: Home / Self Care | Payer: Medicare Other | Source: Ambulatory Visit | Attending: Radiation Oncology | Admitting: Radiation Oncology

## 2018-12-08 ENCOUNTER — Other Ambulatory Visit: Payer: Self-pay

## 2018-12-08 DIAGNOSIS — C3431 Malignant neoplasm of lower lobe, right bronchus or lung: Secondary | ICD-10-CM | POA: Diagnosis not present

## 2018-12-09 ENCOUNTER — Ambulatory Visit
Admission: RE | Admit: 2018-12-09 | Discharge: 2018-12-09 | Disposition: A | Discharge status: Home / Self Care | Payer: Medicare Other | Source: Ambulatory Visit | Attending: Radiation Oncology | Admitting: Radiation Oncology

## 2018-12-09 ENCOUNTER — Other Ambulatory Visit: Payer: Self-pay

## 2018-12-09 DIAGNOSIS — C3431 Malignant neoplasm of lower lobe, right bronchus or lung: Secondary | ICD-10-CM | POA: Diagnosis not present

## 2018-12-10 ENCOUNTER — Other Ambulatory Visit: Payer: Self-pay

## 2018-12-10 ENCOUNTER — Ambulatory Visit
Admission: RE | Admit: 2018-12-10 | Discharge: 2018-12-10 | Disposition: A | Discharge status: Home / Self Care | Payer: Medicare Other | Source: Ambulatory Visit | Attending: Radiation Oncology | Admitting: Radiation Oncology

## 2018-12-10 DIAGNOSIS — C3431 Malignant neoplasm of lower lobe, right bronchus or lung: Secondary | ICD-10-CM | POA: Diagnosis not present

## 2018-12-13 ENCOUNTER — Ambulatory Visit
Admission: RE | Admit: 2018-12-13 | Discharge: 2018-12-13 | Disposition: A | Discharge status: Home / Self Care | Payer: Medicare Other | Source: Ambulatory Visit | Attending: Radiation Oncology | Admitting: Radiation Oncology

## 2018-12-13 ENCOUNTER — Other Ambulatory Visit: Payer: Self-pay

## 2018-12-13 DIAGNOSIS — C3431 Malignant neoplasm of lower lobe, right bronchus or lung: Secondary | ICD-10-CM | POA: Diagnosis not present

## 2018-12-14 ENCOUNTER — Ambulatory Visit
Admission: RE | Admit: 2018-12-14 | Discharge: 2018-12-14 | Disposition: A | Discharge status: Home / Self Care | Payer: Medicare Other | Source: Ambulatory Visit | Attending: Radiation Oncology | Admitting: Radiation Oncology

## 2018-12-14 ENCOUNTER — Other Ambulatory Visit: Payer: Self-pay

## 2018-12-14 DIAGNOSIS — C3431 Malignant neoplasm of lower lobe, right bronchus or lung: Secondary | ICD-10-CM | POA: Diagnosis not present

## 2018-12-15 ENCOUNTER — Other Ambulatory Visit: Payer: Self-pay

## 2018-12-15 ENCOUNTER — Ambulatory Visit
Admission: RE | Admit: 2018-12-15 | Discharge: 2018-12-15 | Disposition: A | Discharge status: Home / Self Care | Payer: Medicare Other | Source: Ambulatory Visit | Attending: Radiation Oncology | Admitting: Radiation Oncology

## 2018-12-15 DIAGNOSIS — C3431 Malignant neoplasm of lower lobe, right bronchus or lung: Secondary | ICD-10-CM | POA: Diagnosis not present

## 2018-12-16 ENCOUNTER — Ambulatory Visit
Admission: RE | Admit: 2018-12-16 | Discharge: 2018-12-16 | Disposition: A | Discharge status: Home / Self Care | Payer: Medicare Other | Source: Ambulatory Visit | Attending: Radiation Oncology | Admitting: Radiation Oncology

## 2018-12-16 ENCOUNTER — Other Ambulatory Visit: Payer: Self-pay

## 2018-12-16 DIAGNOSIS — C3431 Malignant neoplasm of lower lobe, right bronchus or lung: Secondary | ICD-10-CM | POA: Diagnosis not present

## 2018-12-17 ENCOUNTER — Ambulatory Visit
Admission: RE | Admit: 2018-12-17 | Discharge: 2018-12-17 | Disposition: A | Discharge status: Home / Self Care | Payer: Medicare Other | Source: Ambulatory Visit | Attending: Radiation Oncology | Admitting: Radiation Oncology

## 2018-12-17 ENCOUNTER — Other Ambulatory Visit: Payer: Self-pay

## 2018-12-17 DIAGNOSIS — C3431 Malignant neoplasm of lower lobe, right bronchus or lung: Secondary | ICD-10-CM | POA: Diagnosis not present

## 2018-12-17 NOTE — Progress Notes (Signed)
  Radiation Oncology         [phone]) [phone] ________________________________  Name: Susan Holder MRN: [id number]  Date: 11/05/2018  DOB: [birth year]  SIMULATION AND TREATMENT PLANNING NOTE  DIAGNOSIS:     ICD-10-CM   1. Primary malignant neoplasm of bronchus of right lower lobe (HCC)  C34.31      Site:  Right SCLV tumor  NARRATIVE:  The patient was brought to the [facility].  Identity was confirmed.  All relevant records and images related to the planned course of therapy were reviewed.   Written consent to proceed with treatment was confirmed which was freely given after reviewing the details related to the planned course of therapy had been reviewed with the patient.  Then, the patient was set-up in a stable reproducible  supine position for radiation therapy.  CT images were obtained.  Surface markings were placed.    Medically necessary complex treatment device(s) for immobilization:   1.  Thermoplastic mask 2.  accuform device.   The CT images were loaded into the planning software.  Then the target and avoidance structures were contoured.  Treatment planning then occurred.  The radiation prescription was entered and confirmed.  A total of 2 complex treatment devices were fabricated which relate to the designed radiation treatment fields. Each of these customized fields/ complex treatment devices will be used on a daily basis during the radiation course. I have requested : Intensity Modulated Radiotherapy (IMRT) is medically necessary for this case for the following reason:  Avoiding critical normal structures in the setting of re-irradiation, including the spinal cord and esophagus.   The patient will undergo daily image guidance to ensure accurate localization of the target, and adequate minimize dose to the normal surrounding structures in close proximity to the target.   PLAN:  The patient will receive 60 Gy in 25 fractions to the high-dose region, and 50 Gy in  25 fractions to the lowere dose region, using a simultaneous integrated boost technique.    Special treatment procedure The treated area overlaps with a prior area of radiation treatment.  This therefore represents a course of re-irradiation.  The previous treatment will be accounted for in a labor-intensive manner through the planning process.  This will require extra work to account for this, beyond what would be necessary for a standard treatment to this area.  This therefore represents a special treatment procedure.     ________________________________   Jodelle Gross, MD, PhD

## 2018-12-20 ENCOUNTER — Ambulatory Visit
Admission: RE | Admit: 2018-12-20 | Discharge: 2018-12-20 | Disposition: A | Discharge status: Home / Self Care | Payer: Medicare Other | Source: Ambulatory Visit | Attending: Radiation Oncology | Admitting: Radiation Oncology

## 2018-12-20 ENCOUNTER — Other Ambulatory Visit: Payer: Self-pay

## 2018-12-20 DIAGNOSIS — C3431 Malignant neoplasm of lower lobe, right bronchus or lung: Secondary | ICD-10-CM | POA: Diagnosis not present

## 2018-12-27 NOTE — Progress Notes (Signed)
Office Visit Note  Patient: Susan Holder             Date of Birth: 05/20/1955           MRN: [id number]             PCP: Patient, No Pcp Per Referring: No ref. provider found Visit Date: 01/10/2019 Occupation: @GUAROCC @  Subjective:  Other (left shoulder and right ankle pain )    History of Present Illness: Susan Holder is a 63 y.o. female with history of seropositive rheumatoid arthritis.  She states she has been doing quite well on Plaquenil.  She still have right ring trigger finger.  She states she saw the hand surgeon who advised her to wait at this point.  She states she had very good response to the cortisone injection to her right shoulder.  She has good range of motion now.  Her left shoulder joint has been hurting and she has limited range of motion.  Right elbow joint also improved after the injection.  She denies any joint swelling.  Activities of Daily Living:  Patient reports morning stiffness for 0 minutes.   Patient Reports nocturnal pain.  Difficulty dressing/grooming: Reports Difficulty climbing stairs: Denies Difficulty getting out of chair: Denies Difficulty using hands for taps, buttons, cutlery, and/or writing: Reports  Review of Systems  Constitutional: Negative for fatigue.  HENT: Negative for mouth sores, mouth dryness and nose dryness.   Eyes: Negative for dryness.  Respiratory: Negative for shortness of breath, wheezing and difficulty breathing.   Cardiovascular: Negative for chest pain and palpitations.  Gastrointestinal: Negative for blood in stool, constipation and diarrhea.  Endocrine: Negative for increased urination.  Genitourinary: Negative for difficulty urinating and painful urination.  Musculoskeletal: Positive for arthralgias, joint pain and joint swelling. Negative for morning stiffness.  Skin: Negative for rash.  Allergic/Immunologic: Negative for susceptible to infections.  Neurological: Negative for dizziness, numbness,  headaches, memory loss and weakness.  Hematological: Negative for bruising/bleeding tendency.  Psychiatric/Behavioral: Negative for confusion. The patient is not nervous/anxious.     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[place]) 11/04/2018   Acid reflux 09/21/2018   Hoarseness 03/16/2018   Lung cancer ([place]) 03/16/2018   Encounter for antineoplastic immunotherapy 02/01/2018   Primary malignant neoplasm of bronchus of right lower lobe ([place]) 12/21/2017   Adenocarcinoma of right lung, stage 3 ([place]) 11/17/2017   Encounter for antineoplastic chemotherapy 11/17/2017   Goals of care, counseling/discussion 11/17/2017   Lung mass    Left hand pain 03/06/2017   Rheumatoid arthritis involving multiple sites with positive rheumatoid factor ([place]) 05/29/2016   ANA positive 05/29/2016   Vitamin D deficiency 05/29/2016   High risk medication use 05/29/2016   Trigger finger, left ring finger 05/29/2016   Trigger finger, right ring finger 05/29/2016   DDD (degenerative disc disease), cervical 05/29/2016   Smoker 05/29/2016   Other fatigue 05/29/2016   Allergic rhinitis 08/22/2013    Past Medical History:  Diagnosis Date   Rheumatoid arthritis ([place])     Family History  Problem Relation Age of Onset   Stroke Mother    Alzheimer's disease Mother    Heart disease Mother    Emphysema Father    Hypertension Brother    Heart attack Maternal Aunt    Heart failure Maternal Grandmother    Hypertension Paternal Grandmother    Past Surgical History:  Procedure Laterality Date  BACK SURGERY     BRONCHIAL NEEDLE ASPIRATION BIOPSY  11/09/2017   Procedure: BRONCHIAL NEEDLE ASPIRATION BIOPSIES;  Surgeon: Marshell Garfinkel, MD;  Location: [facility];  Service: Cardiopulmonary;;   ENDOBRONCHIAL ULTRASOUND Bilateral 11/09/2017   Procedure: ENDOBRONCHIAL ULTRASOUND;  Surgeon: Marshell Garfinkel, MD;   Location: [facility];  Service: Cardiopulmonary;  Laterality: Bilateral;   NASAL SINUS SURGERY     NASAL SINUS SURGERY     TUBAL LIGATION     VIDEO BRONCHOSCOPY  11/09/2017   Procedure: VIDEO BRONCHOSCOPY;  Surgeon: Marshell Garfinkel, MD;  Location: [facility];  Service: Cardiopulmonary;;   Social History   Social History Narrative   Not on file   Immunization History  Administered Date(s) Administered   Influenza,inj,Quad PF,6+ Mos 03/08/2018     Objective: Vital Signs: BP 117/78 (BP Location: Left Arm, Patient Position: Sitting, Cuff Size: Normal)    Pulse (!) 102    Resp 13    Ht 5' (1.524 m)    Wt 134 lb 9.6 oz (61.1 kg)    LMP 04/28/2000    BMI 26.29 kg/m    Physical Exam Vitals signs and nursing note reviewed.  Constitutional:      Appearance: She is well-developed.  HENT:     Head: Normocephalic and atraumatic.  Eyes:     Conjunctiva/sclera: Conjunctivae normal.  Neck:     Musculoskeletal: Normal range of motion.  Cardiovascular:     Rate and Rhythm: Normal rate and regular rhythm.     Heart sounds: Normal heart sounds.  Pulmonary:     Effort: Pulmonary effort is normal.     Breath sounds: Normal breath sounds.  Abdominal:     General: Bowel sounds are normal.     Palpations: Abdomen is soft.  Lymphadenopathy:     Cervical: No cervical adenopathy.  Skin:    General: Skin is warm and dry.     Capillary Refill: Capillary refill takes less than 2 seconds.  Neurological:     Mental Status: She is alert and oriented to person, place, and time.  Psychiatric:        Behavior: Behavior normal.      Musculoskeletal Exam: C-spine was in good range of motion.  Her left shoulder joint abduction was limited to 110 degrees.  Right shoulder joint was full range of motion.  Elbow joints wrist joint MCPs PIPs DIPs with good range of motion.  She has DIP and PIP thickening with no synovitis.  Hip joints, knee joints, ankle joints with good range of motion with no  synovitis.  CDAI Exam: CDAI Score: 1.4  Patient Global: 2 mm; Provider Global: 2 mm Swollen: 0 ; Tender: 1  Joint Exam      Right  Left  Glenohumeral      Tender     Investigation: No additional findings.  Imaging: No results found.  Recent Labs: Lab Results  Component Value Date   WBC 6.9 10/19/2018   HGB 11.5 (L) 10/19/2018   PLT 354 10/19/2018   NA 138 10/19/2018   K 3.8 10/19/2018   CL 105 10/19/2018   CO2 24 10/19/2018   GLUCOSE 98 10/19/2018   BUN 13 10/19/2018   CREATININE 0.84 10/19/2018   BILITOT <0.2 (L) 10/19/2018   ALKPHOS 115 10/19/2018   AST 23 10/19/2018   ALT 14 10/19/2018   PROT 8.0 10/19/2018   ALBUMIN 3.4 (L) 10/19/2018   CALCIUM 9.7 10/19/2018   GFRAA >60 10/19/2018    Speciality Comments: PLQ eye exam:  04/29/2017 Normal. [facility]. Follow up in 12 months.  Procedures:  Large Joint Inj: L glenohumeral on 01/10/2019 12:01 PM Indications: pain Details: 27 G 1.5 in needle, posterior approach  Arthrogram: No  Medications: 40 mg triamcinolone acetonide 40 MG/ML; 1.5 mL lidocaine 1 % Aspirate: 0 mL Outcome: tolerated well, no immediate complications Procedure, treatment alternatives, risks and benefits explained, specific risks discussed. Consent was given by the patient. Immediately prior to procedure a time out was called to verify the correct patient, procedure, equipment, support staff and site/side marked as required. Patient was prepped and draped in the usual sterile fashion.     Allergies: Patient has no known allergies.   Assessment / Plan:     Visit Diagnoses: Rheumatoid arthritis involving multiple sites with positive rheumatoid factor (HCC) - +RF, +anti-CCP, +ANA with nodulosis: She is clinically doing well with no synovitis.  She states her rheumatoid arthritis is well controlled.  She has been having left shoulder joint pain.  Chronic left shoulder pain-she states she has been having left shoulder joint discomfort since  the last visit.  She is having difficulty lifting her arm.  She also has describes nocturnal pain.  After different treatment options were discussed and side effects were explained the left shoulder joint was injected with cortisone.  She tolerated the procedure well.  I have advised her to do shoulder joint exercises after her symptoms improve.  Her right shoulder joint pain has resolved after the cortisone injection.  High risk medication use -  Plaquenil 200 mg 1 tablet twice daily Monday through Friday only.  Last Plaquenil eye exam normal on 04/29/2017. Most recent CBC/CMP within normal limits except for low hemoglobin but stable on 10/19/2018  Trigger middle finger of right hand-she has had good response to the cortisone injection.  She was referred to the hand surgeon but she decided against surgery at this time.  Chronic pain of right knee-she had no warmth or swelling.  Acute pain of right shoulder-she responded well to the cortisone injection.  DDD (degenerative disc disease), cervical-she has some stiffness but no discomfort.  Other medical problems are listed as follows:  Vitamin D deficiency  Primary malignant neoplasm of bronchus of right lower lobe (HCC)  Adenocarcinoma of right lung, stage 3 (HCC)  Olecranon bursitis, right elbow  History of vitamin D deficiency   Orders: Orders Placed This Encounter  Procedures   Large Joint Inj   No orders of the defined types were placed in this encounter.    Follow-Up Instructions: Return in about 5 months (around 06/12/2019) for Rheumatoid arthritis.   Bo Merino, MD  Note - This record has been created using Editor, commissioning.  Chart creation errors have been sought, but may not always  have been located. Such creation errors do not reflect on  the standard of medical care.

## 2019-01-10 ENCOUNTER — Ambulatory Visit (INDEPENDENT_AMBULATORY_CARE_PROVIDER_SITE_OTHER): Payer: Medicare Other | Admitting: Rheumatology

## 2019-01-10 ENCOUNTER — Other Ambulatory Visit: Payer: Self-pay

## 2019-01-10 ENCOUNTER — Encounter: Payer: Self-pay | Admitting: Rheumatology

## 2019-01-10 VITALS — BP 117/78 | HR 102 | Resp 13 | Ht 60.0 in | Wt 134.6 lb

## 2019-01-10 DIAGNOSIS — M0579 Rheumatoid arthritis with rheumatoid factor of multiple sites without organ or systems involvement: Secondary | ICD-10-CM | POA: Diagnosis not present

## 2019-01-10 DIAGNOSIS — M25511 Pain in right shoulder: Secondary | ICD-10-CM

## 2019-01-10 DIAGNOSIS — M25512 Pain in left shoulder: Secondary | ICD-10-CM | POA: Diagnosis not present

## 2019-01-10 DIAGNOSIS — M5030 Other cervical disc degeneration, unspecified cervical region: Secondary | ICD-10-CM | POA: Diagnosis not present

## 2019-01-10 DIAGNOSIS — Z79899 Other long term (current) drug therapy: Secondary | ICD-10-CM | POA: Diagnosis not present

## 2019-01-10 DIAGNOSIS — C3431 Malignant neoplasm of lower lobe, right bronchus or lung: Secondary | ICD-10-CM

## 2019-01-10 DIAGNOSIS — E559 Vitamin D deficiency, unspecified: Secondary | ICD-10-CM | POA: Diagnosis not present

## 2019-01-10 DIAGNOSIS — C3491 Malignant neoplasm of unspecified part of right bronchus or lung: Secondary | ICD-10-CM

## 2019-01-10 DIAGNOSIS — M7021 Olecranon bursitis, right elbow: Secondary | ICD-10-CM

## 2019-01-10 DIAGNOSIS — M25561 Pain in right knee: Secondary | ICD-10-CM

## 2019-01-10 DIAGNOSIS — G8929 Other chronic pain: Secondary | ICD-10-CM

## 2019-01-10 DIAGNOSIS — M65331 Trigger finger, right middle finger: Secondary | ICD-10-CM

## 2019-01-10 DIAGNOSIS — Z8639 Personal history of other endocrine, nutritional and metabolic disease: Secondary | ICD-10-CM

## 2019-01-10 MED ORDER — TRIAMCINOLONE ACETONIDE 40 MG/ML IJ SUSP
40.0000 mg | INTRAMUSCULAR | Status: AC | PRN
  Administered 2019-01-10: 40 mg via INTRA_ARTICULAR

## 2019-01-10 MED ORDER — LIDOCAINE HCL 1 % IJ SOLN
1.5000 mL | INTRAMUSCULAR | Status: AC | PRN
  Administered 2019-01-10: 1.5 mL

## 2019-01-10 NOTE — Patient Instructions (Signed)
Standing Labs We placed an order today for your standing lab work.    Please come back and get your standing labs in November   We have open lab daily Monday through Thursday from 8:30-12:30 PM and 1:30-4:30 PM and Friday from 8:30-12:30 PM and 1:30 -4:00 PM at the office of Dr. Bo Merino.   You may experience shorter wait times on Monday and Friday afternoons. The office is located at [address], [place], [place], [place] [address] No appointment is necessary.   Labs are drawn by [facility].  You may receive a bill from [facility] for your lab work.  If you wish to have your labs drawn at another location, please call the office 24 hours in advance to send orders.  If you have any questions regarding directions or hours of operation,  please call [phone].   Just as a reminder please drink plenty of water prior to coming for your lab work. Thanks!

## 2019-01-18 ENCOUNTER — Telehealth: Payer: Self-pay | Admitting: Radiation Oncology

## 2019-01-18 NOTE — Telephone Encounter (Signed)
  Radiation Oncology         [phone]) [phone] ________________________________  Name: Susan Holder MRN: [id number]  Date of Service: 01/18/2019  DOB: 04-10-1956  Post Treatment Telephone Note  Diagnosis:  Progressive Metastatic Stage IIIA, cT3N1-2M0 NSCLC, adenocarcinoma of the right lower lobe.   Interval Since Last Radiation:  4 weeks   11/16/2018-12/20/2018: The patient's right supraclavicular nodes were treated to 60 Gy over 25 fractions.  11/30/17-01/11/18:  66 Gy to the right chest and regional nodes over 33 fractions  Narrative:  The patient was contacted today for routine follow-up. During treatment she did very well with radiotherapy but did have a sore throat and discomfort with swallowing. She was using sonafine cream toward the end of treatment.  Impression/Plan: 1. Progressive Metastatic Stage IIIA, cT3N1-2M0 NSCLC, adenocarcinoma of the right lower lobe. I left  a message for the patient to call me back so we could discuss how she is doing. I noticed she is scheduled to follow up after another CT in October of the neck with  Dr. Julien Nordmann in medical oncology.     Carola Rhine, PAC

## 2019-01-20 ENCOUNTER — Encounter: Payer: Self-pay | Admitting: Radiation Oncology

## 2019-01-21 NOTE — Progress Notes (Signed)
  Radiation Oncology         ([phone]) [phone] ________________________________  Name: Susan Holder MRN: [id number]  Date: 01/20/2019  DOB: 1955-12-09  End of Treatment Note  Diagnosis:   NSCLC, recurrence in upper mediastinum/ Rt SLCV     Indication for treatment::  curative       Radiation treatment dates:   11/16/18 - 12/20/18  Site/dose:   The patient was treated to the right SCLV to a dose of 60 Gy in 25 fractions to the high dose region using an SIB technique. The lower dose target received 50 Gy in 25 fractions, using IMRT  Narrative: The patient tolerated radiation treatment relatively well.   She experience expected esophagitis and skin irritation.  Plan: The patient has completed radiation treatment. The patient will return to radiation oncology clinic for routine followup in one month. I advised the patient to call or return sooner if they have any questions or concerns related to their recovery or treatment. ________________________________  Jodelle Gross, M.D., Ph.D.

## 2019-01-25 ENCOUNTER — Other Ambulatory Visit: Payer: Self-pay | Admitting: Rheumatology

## 2019-01-25 DIAGNOSIS — M0579 Rheumatoid arthritis with rheumatoid factor of multiple sites without organ or systems involvement: Secondary | ICD-10-CM

## 2019-01-25 MED ORDER — HYDROXYCHLOROQUINE SULFATE 200 MG PO TABS: ORAL_TABLET | ORAL | 0 refills | Status: DC

## 2019-01-25 NOTE — Telephone Encounter (Signed)
Patient called requesting prescription refill of Plaquenil to be sent to [facility] at [facility].  Patient states she only has one pill remaining which she will be taking tonight.  Patient states [facility] has been trying to reach our office for prescription refill.

## 2019-01-25 NOTE — Telephone Encounter (Signed)
Last Visit: 01/10/19 Next Visit: 06/14/19 Labs: 10/19/18 Hgb 11.5 albumin 3.4, Total Bilirubin <0.2 PLQ eye exam: 04/29/2017 Normal.  Patient advised she is due to update her PLQ eye exam. Patient states she has not had it done because her eye doctor is not doing them right now. Patient states she will call them to see if they have resumed.  Okay to refill PLQ?

## 2019-01-25 NOTE — Telephone Encounter (Signed)
Okay to give her 30-day supply.  Please ask her who is her ophthalmologist.  We can call and to schedule an appointment for her.

## 2019-01-28 ENCOUNTER — Other Ambulatory Visit: Payer: Self-pay | Admitting: Physician Assistant

## 2019-01-28 DIAGNOSIS — M0579 Rheumatoid arthritis with rheumatoid factor of multiple sites without organ or systems involvement: Secondary | ICD-10-CM

## 2019-02-04 ENCOUNTER — Other Ambulatory Visit: Payer: Self-pay

## 2019-02-04 ENCOUNTER — Inpatient Hospital Stay: Payer: Medicare Other | Attending: Internal Medicine

## 2019-02-04 ENCOUNTER — Encounter (HOSPITAL_COMMUNITY): Payer: Self-pay

## 2019-02-04 ENCOUNTER — Ambulatory Visit (HOSPITAL_COMMUNITY)
Admission: RE | Admit: 2019-02-04 | Discharge: 2019-02-04 | Disposition: A | Discharge status: Home / Self Care | Payer: Medicare Other | Source: Ambulatory Visit | Attending: Physician Assistant | Admitting: Physician Assistant

## 2019-02-04 DIAGNOSIS — Z79899 Other long term (current) drug therapy: Secondary | ICD-10-CM | POA: Diagnosis not present

## 2019-02-04 DIAGNOSIS — Z9221 Personal history of antineoplastic chemotherapy: Secondary | ICD-10-CM | POA: Insufficient documentation

## 2019-02-04 DIAGNOSIS — Z923 Personal history of irradiation: Secondary | ICD-10-CM | POA: Insufficient documentation

## 2019-02-04 DIAGNOSIS — C770 Secondary and unspecified malignant neoplasm of lymph nodes of head, face and neck: Secondary | ICD-10-CM | POA: Diagnosis not present

## 2019-02-04 DIAGNOSIS — Z791 Long term (current) use of non-steroidal anti-inflammatories (NSAID): Secondary | ICD-10-CM | POA: Insufficient documentation

## 2019-02-04 DIAGNOSIS — C3431 Malignant neoplasm of lower lobe, right bronchus or lung: Secondary | ICD-10-CM | POA: Diagnosis not present

## 2019-02-04 DIAGNOSIS — Z7982 Long term (current) use of aspirin: Secondary | ICD-10-CM | POA: Diagnosis not present

## 2019-02-04 DIAGNOSIS — M069 Rheumatoid arthritis, unspecified: Secondary | ICD-10-CM | POA: Diagnosis not present

## 2019-02-04 DIAGNOSIS — C3491 Malignant neoplasm of unspecified part of right bronchus or lung: Secondary | ICD-10-CM

## 2019-02-04 LAB — CBC WITH DIFFERENTIAL (CANCER CENTER ONLY)
Abs Immature Granulocytes: 0.02 10*3/uL (ref 0.00–0.07)
Basophils Absolute: 0 10*3/uL (ref 0.0–0.1)
Basophils Relative: 1 %
Eosinophils Absolute: 0.1 10*3/uL (ref 0.0–0.5)
Eosinophils Relative: 2 %
HCT: 38.1 % (ref 36.0–46.0)
Hemoglobin: 12.2 g/dL (ref 12.0–15.0)
Immature Granulocytes: 0 %
Lymphocytes Relative: 12 %
Lymphs Abs: 0.6 10*3/uL — ABNORMAL LOW (ref 0.7–4.0)
MCH: 30.6 pg (ref 26.0–34.0)
MCHC: 32 g/dL (ref 30.0–36.0)
MCV: 95.5 fL (ref 80.0–100.0)
Monocytes Absolute: 0.8 10*3/uL (ref 0.1–1.0)
Monocytes Relative: 15 %
Neutro Abs: 3.7 10*3/uL (ref 1.7–7.7)
Neutrophils Relative %: 70 %
Platelet Count: 314 10*3/uL (ref 150–400)
RBC: 3.99 MIL/uL (ref 3.87–5.11)
RDW: 16 % — ABNORMAL HIGH (ref 11.5–15.5)
WBC Count: 5.2 10*3/uL (ref 4.0–10.5)
nRBC: 0 % (ref 0.0–0.2)

## 2019-02-04 LAB — CMP (CANCER CENTER ONLY)
ALT: 19 U/L (ref 0–44)
AST: 24 U/L (ref 15–41)
Albumin: 3.4 g/dL — ABNORMAL LOW (ref 3.5–5.0)
Alkaline Phosphatase: 92 U/L (ref 38–126)
Anion gap: 8 (ref 5–15)
BUN: 17 mg/dL (ref 8–23)
CO2: 27 mmol/L (ref 22–32)
Calcium: 9.3 mg/dL (ref 8.9–10.3)
Chloride: 106 mmol/L (ref 98–111)
Creatinine: 0.85 mg/dL (ref 0.44–1.00)
GFR, Est AFR Am: >60 mL/min (ref >60)
GFR, Estimated: >60 mL/min (ref >60)
Glucose, Bld: 87 mg/dL (ref 70–99)
Potassium: 4.3 mmol/L (ref 3.5–5.1)
Sodium: 141 mmol/L (ref 135–145)
Total Bilirubin: 0.3 mg/dL (ref 0.3–1.2)
Total Protein: 7.2 g/dL (ref 6.5–8.1)

## 2019-02-04 MED ORDER — SODIUM CHLORIDE (PF) 0.9 % IJ SOLN
INTRAMUSCULAR | Status: AC
  Filled 2019-02-04: qty 50

## 2019-02-04 MED ORDER — IOHEXOL 300 MG/ML  SOLN
75.0000 mL | Freq: Once | INTRAMUSCULAR | Status: AC | PRN
  Administered 2019-02-04: 11:00:00 75 mL via INTRAVENOUS

## 2019-02-07 ENCOUNTER — Encounter: Payer: Self-pay | Admitting: Internal Medicine

## 2019-02-07 ENCOUNTER — Other Ambulatory Visit: Payer: Self-pay

## 2019-02-07 ENCOUNTER — Inpatient Hospital Stay: Payer: Medicare Other | Admitting: Internal Medicine

## 2019-02-07 VITALS — BP 133/84 | HR 110 | Temp 97.8°F | Resp 18 | Ht 61.0 in | Wt 137.6 lb

## 2019-02-07 DIAGNOSIS — C770 Secondary and unspecified malignant neoplasm of lymph nodes of head, face and neck: Secondary | ICD-10-CM | POA: Diagnosis not present

## 2019-02-07 DIAGNOSIS — C3431 Malignant neoplasm of lower lobe, right bronchus or lung: Secondary | ICD-10-CM | POA: Diagnosis not present

## 2019-02-07 DIAGNOSIS — Z5111 Encounter for antineoplastic chemotherapy: Secondary | ICD-10-CM | POA: Diagnosis not present

## 2019-02-07 DIAGNOSIS — C3491 Malignant neoplasm of unspecified part of right bronchus or lung: Secondary | ICD-10-CM | POA: Diagnosis not present

## 2019-02-07 NOTE — Progress Notes (Signed)
[facility] [phone]   [phone]  OFFICE PROGRESS NOTE  Patient, No Pcp Per No address on file  DIAGNOSIS: Stage IIIA (T3, N1/N2, M0) non-small cell lung cancer, adenocarcinoma presented with large right lower lobe lung mass with extension to the right hilum and subcarinal area diagnosed in July 2019.  Biomarker Findings Tumor Mutational Burden - TMB-Intermediate (6 Muts/Mb) Microsatellite status - MS-Stable Genomic Findings For a complete list of the genes assayed, please refer to the Appendix. NRAS Q61R ARAF amplification STK11 G56W KRAS G13D MYCN amplification MCL1 amplification NKX2-1 amplification - equivocal TP53 G245V 7 Disease relevant genes with no reportable alterations: EGFR, ALK, BRAF, MET, ERBB2, RET, ROS1   PRIOR THERAPY:  1) Course of concurrent chemoradiation with weekly carboplatin for AUC of 2 and paclitaxel 45 mg/M2.  Status post 7 cycles.  Last dose was giving 01/11/2018. 2) Consolidation treatment with immunotherapy with Imfinzi (Durvalumab) 10 mg/KG every 2 weeks.  First dose February 09, 2018.  Status post 19 cycles. 3) status post stereotactic body radiotherapy to the enlarging right supraclavicular lymphadenopathy under the care of Dr. Lisbeth Renshaw.  CURRENT THERAPY: Observation.   INTERVAL HISTORY: Susan Holder 63 y.o. female returns to the clinic today for follow-up visit.  The patient is feeling fine today with no concerning complaints.  She denied having any chest pain, shortness of breath, cough or hemoptysis.  She denied having any recent weight loss or night sweats.  She has no nausea, vomiting, diarrhea or constipation.  She denied having any headache or visual changes.  She has no fever or chills.  The patient tolerated her previous radiotherapy fairly well except for soreness of her throat.  She has been on observation for the last few months.  She is here today for evaluation with repeat CT scan of the  neck and chest.   MEDICAL HISTORY: Past Medical History:  Diagnosis Date   Rheumatoid arthritis ([place])     ALLERGIES:  has No Known Allergies.  MEDICATIONS:  Current Outpatient Medications  Medication Sig Dispense Refill   aspirin EC 81 MG tablet Take 81 mg by mouth daily.     diclofenac sodium (VOLTAREN) 1 % GEL Apply 2 g topically 4 (four) times daily.     hydroxychloroquine (PLAQUENIL) 200 MG tablet Take 1 tablet by mouth twice daily Monday through Friday 40 tablet 0   lidocaine (XYLOCAINE) 2 % solution Use as directed 10-15 mLs in the mouth or throat every 6 (six) hours as needed for mouth pain. (Patient not taking: Reported on 01/10/2019) 200 mL 1   naproxen sodium (ALEVE) 220 MG tablet Take 220 mg by mouth daily as needed (PAIN).     omeprazole (PRILOSEC) 20 MG capsule Take 20 mg by mouth daily.     Phenylephrine-DM-GG-APAP (DELSYM COUGH/COLD DAYTIME PO) Take by mouth as needed.      pseudoephedrine-guaifenesin (MUCINEX D) 60-600 MG 12 hr tablet Take 1 tablet by mouth as needed.      VITAMIN D PO Take by mouth.     No current facility-administered medications for this visit.     SURGICAL HISTORY:  Past Surgical History:  Procedure Laterality Date   BACK SURGERY     BRONCHIAL NEEDLE ASPIRATION BIOPSY  11/09/2017   Procedure: BRONCHIAL NEEDLE ASPIRATION BIOPSIES;  Surgeon: Marshell Garfinkel, MD;  Location: [facility];  Service: Cardiopulmonary;;   ENDOBRONCHIAL ULTRASOUND Bilateral 11/09/2017   Procedure: ENDOBRONCHIAL ULTRASOUND;  Surgeon: Marshell Garfinkel, MD;  Location: [facility]  Service: Cardiopulmonary;  Laterality: Bilateral;  °• NASAL SINUS SURGERY    °• NASAL SINUS SURGERY    °• TUBAL LIGATION    °• VIDEO BRONCHOSCOPY  11/09/2017  ° Procedure: VIDEO BRONCHOSCOPY;  Surgeon: Mannam, Praveen, MD;  Location: [facility];  Service: Cardiopulmonary;;  ° ° °REVIEW OF SYSTEMS:  Constitutional: negative °Eyes: negative °Ears, nose, mouth, throat, and face:  negative °Respiratory: negative °Cardiovascular: negative °Gastrointestinal: negative °Genitourinary:negative °Integument/breast: negative °Hematologic/lymphatic: negative °Musculoskeletal:negative °Neurological: negative °Behavioral/Psych: negative °Endocrine: negative °Allergic/Immunologic: negative  ° °PHYSICAL EXAMINATION: General appearance: alert, cooperative and no distress °Head: Normocephalic, without obvious abnormality, atraumatic °Neck: no adenopathy, no JVD, supple, symmetrical, trachea midline and thyroid not enlarged, symmetric, no tenderness/mass/nodules °Lymph nodes: Cervical, supraclavicular, and axillary nodes normal. °Resp: clear to auscultation bilaterally °Back: symmetric, no curvature. ROM normal. No CVA tenderness. °Cardio: regular rate and rhythm, S1, S2 normal, no murmur, click, rub or gallop °GI: soft, non-tender; bowel sounds normal; no masses,  no organomegaly °Extremities: extremities normal, atraumatic, no cyanosis or edema °Neurologic: Alert and oriented X 3, normal strength and tone. Normal symmetric reflexes. Normal coordination and gait ° °ECOG PERFORMANCE STATUS: 1 - Symptomatic but completely ambulatory ° °Blood pressure 133/84, pulse (!) 110, temperature 97.8 °F (36.6 °C), temperature source Temporal, resp. rate 18, height 5' 1" (1.549 m), weight 137 lb 9.6 oz (62.4 kg), last menstrual period 04/28/2000, SpO2 100 %. ° °LABORATORY DATA: °Lab Results  °Component Value Date  ° WBC 5.2 02/04/2019  ° HGB 12.2 02/04/2019  ° HCT 38.1 02/04/2019  ° MCV 95.5 02/04/2019  ° PLT 314 02/04/2019  ° ° °  Chemistry   °   °Component Value Date/Time  ° NA 141 02/04/2019 0910  ° K 4.3 02/04/2019 0910  ° CL 106 02/04/2019 0910  ° CO2 27 02/04/2019 0910  ° BUN 17 02/04/2019 0910  ° CREATININE 0.85 02/04/2019 0910  ° CREATININE 0.80 07/24/2017 1438  °    °Component Value Date/Time  ° CALCIUM 9.3 02/04/2019 0910  ° ALKPHOS 92 02/04/2019 0910  ° AST 24 02/04/2019 0910  ° ALT 19 02/04/2019 0910  °  BILITOT 0.3 02/04/2019 0910  °  ° ° ° °RADIOGRAPHIC STUDIES: °Ct Soft Tissue Neck W Contrast ° °Result Date: 02/04/2019 °CLINICAL DATA:  Adenocarcinoma of right lung, stage III. Restaging lung cancer. Additional history provided: Right lung cancer diagnosed June 2019, right-sided neck cancer diagnosed 2020; radiation therapy to lung with chemotherapy completed September 2019, radiation to neck completed 11/15/2018, chronic cough, dyspnea upon exertion. EXAM: CT NECK WITH CONTRAST TECHNIQUE: Multidetector CT imaging of the neck was performed using the standard protocol following the bolus administration of intravenous contrast. CONTRAST:  75mL OMNIPAQUE IOHEXOL 300 MG/ML  SOLN COMPARISON:  Nuclear medicine PET scan 11/02/2018, CT chest 10/15/2018, brain MRI 11/23/2017. FINDINGS: Pharynx and larynx: Streak artifact from dental restoration somewhat limits evaluation of the oral cavity. No appreciable mass or swelling within the nasopharynx, oral cavity/oropharynx, hypopharynx or larynx. Salivary glands: No evidence of inflammation, mass or stone. Thyroid: Negative Lymph nodes: A previously demonstrated large right supraclavicular lymph node/nodal mass has become less well-defined since nuclear medicine PET scan 11/02/2018. The mass measures 2.8 x 2.2 cm in transaxial dimensions today (series 2, image 79) (previously 3.2 x 3.2 cm when measured similarly). There is decreased mass effect upon the adjacent right thyroid lobe. As before, the mass abuts the right innominate artery, common carotid artery, and caudal right internal jugular vein. No new pathologically enlarged cervical chain lymph nodes are   identified Vascular: Scattered calcified and noncalcified plaque within the visualized aortic arch, proximal major branch vessels of the neck and bilateral carotid systems. Limited intracranial: 0.7 x 0.6 cm enhancing metastasis within the left cerebellum with surrounding vasogenic edema (series 2, image 11). This is new as  compared to prior MRI head 11/23/2017. Visualized orbits: Incompletely imaged.  No acute abnormality. Mastoids and visualized paranasal sinuses: No significant paranasal sinus disease or mastoid effusion at the imaged levels Skeleton: Mild cervical spondylosis. No acute bony abnormality or suspicious osseous lesions. Upper chest: Separately reported. Impression #2 will be called to the ordering clinician or representative by the Radiologist Assistant, and communication documented in the PACS or zVision Dashboard. IMPRESSION: 1. A large right supraclavicular lymph node/nodal mass has become less well defined and has decreased in size since prior nuclear medicine PET scan 11/02/2018, now measuring 2.8 x 2.2 cm in transaxial dimensions. No new cervical lymphadenopathy is identified. 2. 0.7 cm enhancing metastasis within the left cerebellum with surrounding edema, new as compared to MRI 11/23/2017. Dedicated MRI imaging of the brain with and without contrast is recommended for further evaluation. 3. Please see separately reported CT chest for additional findings. Electronically Signed   By: Kyle  Golden   On: 02/04/2019 16:29  ° °Ct Chest W Contrast ° °Result Date: 02/04/2019 °CLINICAL DATA:  Restaging lung cancer.  Chronic cough. EXAM: CT CHEST WITH CONTRAST TECHNIQUE: Multidetector CT imaging of the chest was performed during intravenous contrast administration. CONTRAST:  75mL OMNIPAQUE IOHEXOL 300 MG/ML  SOLN COMPARISON:  PET-CT 11/02/2018 FINDINGS: Cardiovascular: The heart is normal in size. No pericardial effusion. Stable tortuosity and calcification of the thoracic aorta and stable coronary artery calcifications. Mediastinum/Nodes: No mediastinal or hilar mass or adenopathy. The esophagus is grossly normal. Lungs/Pleura: Extensive radiation changes in the anterior aspect of the left upper lobe in the posterior aspect of the right upper lobe. There is also dense radiation changes in the right perihilar region and  also extending down more inferiorly anterior to the major fissure. I do not see any definite CT findings suspicious for recurrent tumor. No new pulmonary nodules to suggest pulmonary metastatic disease. Upper Abdomen: No significant upper abdominal findings. Musculoskeletal: No breast masses are identified. The supraclavicular nodal mass on the right side has decreased in size when compared to the prior PET-CT. It measures approximately 3.1 x 1.5 cm and previously measured 3.5 x 2.9 cm. No axillary adenopathy. No significant bony findings. IMPRESSION: 1. Extensive radiation changes as detailed above. I do not see any findings suspicious for recurrent tumor in the chest. No adenopathy and no pulmonary nodules to suggest pulmonary metastatic disease. 2. Interval decrease in size of the right supraclavicular nodal mass. 3. Stable emphysematous changes and pulmonary scarring. 4. No findings for upper abdominal metastatic disease. Aortic Atherosclerosis (ICD10-I70.0) and Emphysema (ICD10-J43.9). Electronically Signed   By: P.  Gallerani M.D.   On: 02/04/2019 16:03  ° ° °ASSESSMENT AND PLAN: This is a very pleasant 63 years old African-American female recently diagnosed with a stage IIIA non-small cell lung cancer, adenocarcinoma.  She underwent a course of concurrent chemoradiation with weekly carboplatin and paclitaxel status post 7 cycles with partial response.   °The patient tolerated this course of treatment well except for mild odynophagia and dysphagia. °She completed on consolidation treatment with immunotherapy with Imfinzi (Durvalumab) status post 18 cycles. °She also completed SBRT to the right supraclavicular lymphadenopathy. °The patient has been on observation.  She had a repeat CT scan of the   of the chest, and neck performed recently.  I personally and independently reviewed the images studies and discussed the result and showed the images to the patient today. Her scan showed no concerning findings for disease  progression in the neck or the chest but there was a 0.7 cm enhancing metastasis within the left cerebellum with surrounding edema that is new compared to the previous imaging studies and require further evaluation. I recommended for the patient to have MRI of the brain with SRS protocol to rule out metastatic disease to the brain.  We also referred the patient to see Dr. Lisbeth Renshaw for consideration of treatment of this lesion or any other suspicious disease to the brain. I will see her back for follow-up visit in 3 months with repeat CT scan of the neck and the chest for restaging of her disease. The patient was advised to call immediately if she has any concerning symptoms in the interval.  The patient voices understanding of current disease status and treatment options and is in agreement with the current care plan. All questions were answered. The patient knows to call the clinic with any problems, questions or concerns. We can certainly see the patient much sooner if necessary.  Disclaimer: This note was dictated with voice recognition software. Similar sounding words can inadvertently be transcribed and may not be corrected upon review.

## 2019-02-08 ENCOUNTER — Telehealth: Payer: Self-pay | Admitting: Internal Medicine

## 2019-02-08 NOTE — Telephone Encounter (Signed)
Scheduled appt per 10/12 los - mailed reminder letter with appt date and time

## 2019-02-10 ENCOUNTER — Ambulatory Visit
Admission: RE | Admit: 2019-02-10 | Discharge: 2019-02-10 | Disposition: A | Discharge status: Home / Self Care | Payer: Medicare Other | Source: Ambulatory Visit | Attending: Radiation Oncology | Admitting: Radiation Oncology

## 2019-02-10 ENCOUNTER — Other Ambulatory Visit: Payer: Self-pay

## 2019-02-10 ENCOUNTER — Encounter: Payer: Self-pay | Admitting: Radiation Oncology

## 2019-02-10 ENCOUNTER — Other Ambulatory Visit: Payer: Self-pay | Admitting: Radiation Therapy

## 2019-02-10 DIAGNOSIS — C3431 Malignant neoplasm of lower lobe, right bronchus or lung: Secondary | ICD-10-CM

## 2019-02-10 DIAGNOSIS — C7931 Secondary malignant neoplasm of brain: Secondary | ICD-10-CM

## 2019-02-10 DIAGNOSIS — C3491 Malignant neoplasm of unspecified part of right bronchus or lung: Secondary | ICD-10-CM

## 2019-02-10 DIAGNOSIS — C770 Secondary and unspecified malignant neoplasm of lymph nodes of head, face and neck: Secondary | ICD-10-CM

## 2019-02-10 NOTE — Progress Notes (Signed)
Radiation Oncology         [phone] ________________________________  Outpatient ReConsultation - Conducted via telephone due to current COVID-19 concerns for limiting patient exposure  I spoke with the patient to conduct this consult visit via telephone to spare the patient unnecessary potential exposure in the healthcare setting during the current COVID-19 pandemic. The patient was notified in advance and was offered a [place] meeting to allow for face to face communication but unfortunately reported that they did not have the appropriate resources/technology to support such a visit and instead preferred to proceed with a telephone visit.  ________________________________  Name: Susan Holder MRN: [id number]  Date of Service: 02/10/2019  DOB: 08-May-1955  Post Treatment Telephone Note  Diagnosis:  Progressive Metastatic Stage IIIA, cT3N1-2M0 NSCLC, adenocarcinoma of the right lower lobe.   Interval Since Last Radiation:  2 months  11/16/2018-12/20/2018: The patient's right supraclavicular nodes were treated to 60 Gy over 25 fractions.  11/30/17-01/11/18:  66 Gy to the right chest and regional nodes over 33 fractions  Narrative:  The patient was contacted today by phone to review her recent course.  In summary she is a well-known patient to Dr. Lisbeth Renshaw service who was diagnosed with stage III adenocarcinoma of the right lower lobe.  She received chemoradiation in 2019.  She was found to have recurrence after completing systemic therapy including consolidation immunotherapy.  Her recurrence was seen at the right supraclavicular nodes and she received definitive radiotherapy to the right nodes in the supraclavicular region that she finished in August 2020.  She was having routine imaging of the neck to determine response on 02/04/2019.  Fortunately her primary lung tumor was no longer visualized and post radiotherapy change was persistent.  In the neck the conglomerate lymph nodes now measure  2.8 x 2.2 cm compared to 3.2 x 3.2 previously, incidentally there was a new mass seen in the left cerebellum measuring 7 x 6 mm and was limited in view based on the study.  She is contacted today regarding the options to pursue treatment of the brain.  On review of systems the patient reports she is doing extremely well and has not noticed any symptoms of headache, visual changes or dizziness or gait imbalance.  She states that she has not had any new changes in terms of trouble breathing, fevers or chills chest pain, abdominal pain, no other complaints are verbalized.  Past Medical History:  Past Medical History:  Diagnosis Date   Rheumatoid arthritis ([place])     Past Surgical History: Past Surgical History:  Procedure Laterality Date   BACK SURGERY     BRONCHIAL NEEDLE ASPIRATION BIOPSY  11/09/2017   Procedure: BRONCHIAL NEEDLE ASPIRATION BIOPSIES;  Surgeon: Marshell Garfinkel, MD;  Location: [facility];  Service: Cardiopulmonary;;   ENDOBRONCHIAL ULTRASOUND Bilateral 11/09/2017   Procedure: ENDOBRONCHIAL ULTRASOUND;  Surgeon: Marshell Garfinkel, MD;  Location: [facility];  Service: Cardiopulmonary;  Laterality: Bilateral;   NASAL SINUS SURGERY     NASAL SINUS SURGERY     TUBAL LIGATION     VIDEO BRONCHOSCOPY  11/09/2017   Procedure: VIDEO BRONCHOSCOPY;  Surgeon: Marshell Garfinkel, MD;  Location: [facility];  Service: Cardiopulmonary;;    Social History:  Social History   Socioeconomic History   Marital status: Divorced    Spouse name: Not on file   Number of children: Not on file   Years of education: Not on file   Highest education level: Not on file  Occupational History   Not on  file  Social Needs   Financial resource strain: Not on file   Food insecurity    Worry: Not on file    Inability: Not on file   Transportation needs    Medical: Not on file    Non-medical: Not on file  Tobacco Use   Smoking status: Former Smoker    Packs/day: 0.50    Years: 45.00     Pack years: 22.50    Types: Cigarettes    Quit date: 09/29/2017    Years since quitting: 1.3   Smokeless tobacco: Never Used  Substance and Sexual Activity   Alcohol use: Not Currently   Drug use: No   Sexual activity: Not Currently  Lifestyle   Physical activity    Days per week: Not on file    Minutes per session: Not on file   Stress: Not on file  Relationships   Social connections    Talks on phone: Not on file    Gets together: Not on file    Attends religious service: Not on file    Active member of club or organization: Not on file    Attends meetings of clubs or organizations: Not on file    Relationship status: Not on file   Intimate partner violence    Fear of current or ex partner: No    Emotionally abused: No    Physically abused: No    Forced sexual activity: No  Other Topics Concern   Not on file  Social History Narrative   Not on file  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  Family History: Family History  Problem Relation Age of Onset   Stroke Mother    Alzheimer's disease Mother    Heart disease Mother    Emphysema Father    Hypertension Brother    Heart attack Maternal Aunt    Heart failure Maternal Grandmother    Hypertension Paternal Grandmother    Medications: Current Outpatient Medications  Medication Sig Dispense Refill   aspirin EC 81 MG tablet Take 81 mg by mouth daily.     hydroxychloroquine (PLAQUENIL) 200 MG tablet Take 1 tablet by mouth twice daily Monday through Friday 40 tablet 0   naproxen sodium (ALEVE) 220 MG tablet Take 220 mg by mouth daily as needed (PAIN).     omeprazole (PRILOSEC) 20 MG capsule Take 20 mg by mouth daily.     Phenylephrine-DM-GG-APAP (DELSYM COUGH/COLD DAYTIME PO) Take by mouth as needed.      pseudoephedrine-guaifenesin (MUCINEX D) 60-600 MG 12 hr tablet Take 1 tablet by mouth as needed.      VITAMIN D PO Take by mouth.     diclofenac sodium (VOLTAREN) 1  % GEL Apply 2 g topically 4 (four) times daily.     lidocaine (XYLOCAINE) 2 % solution Use as directed 10-15 mLs in the mouth or throat every 6 (six) hours as needed for mouth pain. (Patient not taking: Reported on 01/10/2019) 200 mL 1   No current facility-administered medications for this encounter.    Allergies: No Known Allergies   Physical Exam Unable to assess due to encounter type.  Impression/Plan: 1. Progressive Metastatic Stage IIIA, cT3N1-2M0 NSCLC, adenocarcinoma of the right lower lobe with new brain lesion.  Dr. Lisbeth Renshaw discusses the findings from the patient's most recent imaging and reviews her recent course since her radiotherapy ended.  He discusses the rationale to proceed with further work-up of her brain with a dedicated MRI scan and  specifically a 3T image as she may  be a good candidate for stereotactic radiosurgery.  The patient does not have a known relationship with a neurosurgeon we discussed the role but they play in the process of planning and delivering treatment.  We discussed the rationale for focused treatment rather than whole brain if she remains a candidate following her MRI scan.  We will ask our brain navigator to be in touch with the patient to coordinate the 3T scan.  Her case will be reviewed in multidisciplinary brain oncology conference following the scan as we move forward with offering treatment.  We discussed the risks, benefits, short and long-term effects of radiotherapy and Dr. Lisbeth Renshaw anticipates a single fraction course depending on the results from her MRI.  She is in agreement and would like to proceed in this manner.  She is encouraged to call us with any questions or concerns prior to her MRI scan.  It appears that this is in progress and we anticipate the scan to be performed within the next week or 2.    Given current concerns for patient exposure during the COVID-19 pandemic, this encounter was conducted via telephone.  The patient has given  verbal consent for this type of encounter. The time spent during this encounter was 30 minutes and 50% of that time was spent in the coordination of her care. The attendants for this meeting include Dr. Lisbeth Renshaw,   Shona Simpson, [facility] and Farrel Gobble  During the encounter, Dr. Lisbeth Renshaw and Shona Simpson [facility] were located at [facility] Radiation Oncology Department.  Susan Holder  was located at home.   The above documentation reflects my direct findings during this shared patient visit. Please see the separate note by Dr. Lisbeth Renshaw on this date for the remainder of the patient's plan of care.    Carola Rhine, PAC

## 2019-02-23 ENCOUNTER — Ambulatory Visit
Admission: RE | Admit: 2019-02-23 | Discharge: 2019-02-23 | Disposition: A | Discharge status: Home / Self Care | Payer: Medicare Other | Source: Ambulatory Visit | Attending: Radiation Oncology | Admitting: Radiation Oncology

## 2019-02-23 ENCOUNTER — Other Ambulatory Visit: Payer: Self-pay | Admitting: Rheumatology

## 2019-02-23 ENCOUNTER — Other Ambulatory Visit: Payer: Self-pay

## 2019-02-23 DIAGNOSIS — C7931 Secondary malignant neoplasm of brain: Secondary | ICD-10-CM

## 2019-02-23 DIAGNOSIS — C7949 Secondary malignant neoplasm of other parts of nervous system: Secondary | ICD-10-CM

## 2019-02-23 DIAGNOSIS — M0579 Rheumatoid arthritis with rheumatoid factor of multiple sites without organ or systems involvement: Secondary | ICD-10-CM

## 2019-02-23 MED ORDER — GADOBENATE DIMEGLUMINE 529 MG/ML IV SOLN
12.0000 mL | Freq: Once | INTRAVENOUS | Status: AC | PRN
  Administered 2019-02-23: 12 mL via INTRAVENOUS

## 2019-02-23 MED ORDER — HYDROXYCHLOROQUINE SULFATE 200 MG PO TABS: ORAL_TABLET | ORAL | 0 refills | Status: DC

## 2019-02-23 NOTE — Telephone Encounter (Signed)
Left message to advise patient we do not have update PLQ eye exam. Advise patient to contact eye doctor to have them fax results.    Last Visit: 01/10/19 Next Visit: 06/14/19 Labs: 10/19/18 Hgb 11.5 albumin 3.4, Total Bilirubin <0.2 PLQ eye exam: 04/29/2017 Normal  Okay to refill 30 day supply PLQ?

## 2019-02-23 NOTE — Telephone Encounter (Signed)
Patient left a voicemail requesting prescription refill of Plaquenil.  Patient states she had her field of vision eye test and was told they faxed the results to Dr. Estanislado Pandy.  Patient states she has no medication and requesting the prescription be sent ASAP.

## 2019-02-23 NOTE — Telephone Encounter (Signed)
ok 

## 2019-02-28 ENCOUNTER — Inpatient Hospital Stay: Payer: Medicare Other | Attending: Internal Medicine

## 2019-03-01 ENCOUNTER — Telehealth: Payer: Self-pay | Admitting: Radiation Oncology

## 2019-03-01 ENCOUNTER — Telehealth: Payer: Self-pay

## 2019-03-01 NOTE — Telephone Encounter (Signed)
Patient was concerned about an email for information from a neurologist office. She was unaware of an appointment here tomorrow info given about our appointment, Marcine Matar and Shona Simpson informed of patients issue.

## 2019-03-01 NOTE — Telephone Encounter (Signed)
I called and let the patient know the discussion from brain oncology conference that she is still a candidate for SRS in 1 fraction. She does have 6 lesions, 4 supratentorial and two (the larger measuring 13 mm and 12 mm) in the cerebellum. She is coming in for simulation tomorrow afternoon and will sign consent to proceed.

## 2019-03-02 ENCOUNTER — Other Ambulatory Visit: Payer: Self-pay

## 2019-03-02 ENCOUNTER — Ambulatory Visit
Admission: RE | Admit: 2019-03-02 | Discharge: 2019-03-02 | Disposition: A | Discharge status: Home / Self Care | Payer: Medicare Other | Source: Ambulatory Visit | Attending: Radiation Oncology | Admitting: Radiation Oncology

## 2019-03-02 DIAGNOSIS — C3431 Malignant neoplasm of lower lobe, right bronchus or lung: Secondary | ICD-10-CM | POA: Diagnosis not present

## 2019-03-02 DIAGNOSIS — C7931 Secondary malignant neoplasm of brain: Secondary | ICD-10-CM

## 2019-03-02 DIAGNOSIS — C770 Secondary and unspecified malignant neoplasm of lymph nodes of head, face and neck: Secondary | ICD-10-CM | POA: Diagnosis not present

## 2019-03-02 DIAGNOSIS — Z510 Encounter for antineoplastic radiation therapy: Secondary | ICD-10-CM | POA: Insufficient documentation

## 2019-03-02 NOTE — Progress Notes (Signed)
Has armband been applied?  yes  Does patient have an allergy to IV contrast dye?:no   Has patient ever received premedication for IV contrast dye?: no  Does patient take metformin?: no    Date of lab work 02/04/19 BUN: 17 CR: 0.85  IV site: R AC  Has IV site been added to flowsheet?  yes

## 2019-03-03 ENCOUNTER — Other Ambulatory Visit: Payer: Self-pay | Admitting: Radiation Therapy

## 2019-03-08 DIAGNOSIS — C3431 Malignant neoplasm of lower lobe, right bronchus or lung: Secondary | ICD-10-CM | POA: Diagnosis not present

## 2019-03-09 ENCOUNTER — Ambulatory Visit
Admission: RE | Admit: 2019-03-09 | Discharge: 2019-03-09 | Disposition: A | Discharge status: Home / Self Care | Payer: Medicare Other | Source: Ambulatory Visit | Attending: Radiation Oncology | Admitting: Radiation Oncology

## 2019-03-09 ENCOUNTER — Other Ambulatory Visit: Payer: Self-pay

## 2019-03-09 VITALS — BP 139/74 | HR 91 | Temp 98.2°F | Resp 18

## 2019-03-09 DIAGNOSIS — C7931 Secondary malignant neoplasm of brain: Secondary | ICD-10-CM

## 2019-03-09 DIAGNOSIS — C3431 Malignant neoplasm of lower lobe, right bronchus or lung: Secondary | ICD-10-CM | POA: Diagnosis not present

## 2019-03-09 NOTE — Progress Notes (Addendum)
Patient in for observation post SRS. She is sitting up watching TV. She denies any issues or complaints of at this time. She will be observed for 30 minutes prior to her discharge home. She is aware of one month follow up and she was given instructions of when to call by Marcine Matar RN navigator.  [id number] Patient escorted out doing well denies any issues

## 2019-03-09 NOTE — Op Note (Signed)
Stereotactic Radiosurgery Operative Note  Name: Susan Holder MRN: [id number]  Date: 03/09/2019  DOB: 02-21-1956  Op Note  Pre Operative Diagnosis: Metastatic lung cancer with 6 brain metastases, including an inferior right frontal metastasis proximate to the optic chiasm and right optic nerve and a right cerebellar metastasis directly abutting the brainstem  Post Operative Diagnois:  Metastatic lung cancer with 6 brain metastases, including an inferior right frontal metastasis proximate to the optic chiasm and right optic nerve and a right cerebellar metastasis directly abutting the brainstem  3D TREATMENT PLANNING AND DOSIMETRY:  The patient's radiation plan was reviewed and approved by myself (neurosurgery) and Dr. Kyung Rudd (radiation oncology) prior to treatment.  It showed 3-dimensional radiation distributions overlaid onto the planning CT/MRI image set.  The [facility] for the target structures as well as the organs at risk were reviewed. The documentation of the 3D plan and dosimetry are filed in the radiation oncology EMR.  NARRATIVE:  Susan Holder was brought to the TrueBeam stereotactic radiation treatment machine and placed supine on the CT couch. The head frame was applied, and the patient was set up for stereotactic radiosurgery.  A single isocenter multi target technique was used.  I was present virtually via WebEx for the set-up and delivery.  SIMULATION VERIFICATION:  In the couch zero-angle position, the patient underwent Exactrac imaging using the Brainlab system with orthogonal KV images.  These were carefully aligned and repeated to confirm treatment position for each of the isocenters.  The Exactrac snap film verification was repeated at each couch angle.  SPECIAL TREATMENT PROCEDURE: Susan Holder received stereotactic radiosurgery to the following targets: The right inferior frontal, both left frontal, the right cerebellar, and both left cerebellar targets (1 complex,  5 simple) were treated using 6 Rapid Arc VMAT Beams.  All targets other than for the right cerebellar were treated to a prescription dose of 20 Gy.  The the right cerebellar target was treated to a prescription dose of 20 Gy other than for its ventral medial aspect which was treated to a reduced dose due to being immediately adjacent to the brainstem.  ExacTrac registration was performed for each couch angle.  The 100% isodose line was prescribed, other than for the ventral medial aspect of the right cerebellar target.  STEREOTACTIC TREATMENT MANAGEMENT:  Following delivery, the patient was transported to nursing in stable condition and monitored for possible acute effects.  Vital signs were recorded LMP 04/28/2000 . The patient tolerated treatment without significant acute effects, and was discharged to home in stable condition.    PLAN: Follow-up in one month.

## 2019-03-20 ENCOUNTER — Other Ambulatory Visit: Payer: Self-pay | Admitting: Rheumatology

## 2019-03-20 DIAGNOSIS — M0579 Rheumatoid arthritis with rheumatoid factor of multiple sites without organ or systems involvement: Secondary | ICD-10-CM

## 2019-03-22 ENCOUNTER — Inpatient Hospital Stay: Payer: Medicare Other

## 2019-03-22 NOTE — Telephone Encounter (Addendum)
Last Visit: 01/10/2019 Next Visit: 06/14/2019 Labs: 02/04/2019 RDW 16.0, lymphs abs 0.6, albumin 3.4.  Eye exam: 02/03/2019  Okay to refill per Dr. Estanislado Pandy.

## 2019-03-29 DIAGNOSIS — C7931 Secondary malignant neoplasm of brain: Secondary | ICD-10-CM | POA: Insufficient documentation

## 2019-03-29 HISTORY — DX: Secondary malignant neoplasm of brain: C79.31

## 2019-03-29 NOTE — Progress Notes (Signed)
  Radiation Oncology         [phone] ________________________________  Name: Susan Holder MRN: [id number]  Date: 03/02/2019  DOB: 28-Jul-1955  DIAGNOSIS:     ICD-10-CM   1. Brain metastases ([place])  C79.31     NARRATIVE:  The patient was brought to the [place].  Identity was confirmed.  All relevant records and images related to the planned course of therapy were reviewed.  The patient freely provided informed written consent to proceed with treatment after reviewing the details related to the planned course of therapy. The consent form was witnessed and verified by the simulation staff. Intravenous access was established for contrast administration. Then, the patient was set-up in a stable reproducible supine position for radiation therapy.  A relocatable thermoplastic stereotactic head frame was fabricated for precise immobilization.  CT images were obtained.  Surface markings were placed.  The CT images were loaded into the planning software and fused with the patient's targeting MRI scan.  Then the target and avoidance structures were contoured.  Treatment planning then occurred.  The radiation prescription was entered and confirmed.  I have requested 3D planning  I have requested a DVH of the following structures: Brain stem, brain, left eye, right eye, lenses, optic chiasm, target volumes, uninvolved brain, and normal tissue.    SPECIAL TREATMENT PROCEDURE:  The planned course of therapy using radiation constitutes a special treatment procedure. Special care is required in the management of this patient for the following reasons. This treatment constitutes a Special Treatment Procedure for the following reason: High dose per fraction requiring special monitoring for increased toxicities of treatment including daily imaging.  The special nature of the planned course of radiotherapy will require increased physician supervision and oversight to ensure patient's safety with  optimal treatment outcomes.  PLAN:  The patient will receive 20 Gy in 1 fraction to each of the 6 brain mets.   ------------------------------------------------  Jodelle Gross, MD, PhD

## 2019-03-30 NOTE — Addendum Note (Signed)
Encounter addended by: Kyung Rudd, MD on: 03/30/2019 12:01 PM  Actions taken: Clinical Note Signed

## 2019-03-30 NOTE — Progress Notes (Signed)
  Radiation Oncology         [phone] ________________________________  Name: Susan Holder MRN: [id number]  Date: 03/09/2019  DOB: Oct 05, 1955   SPECIAL TREATMENT PROCEDURE   3D TREATMENT PLANNING AND DOSIMETRY: The patient's radiation plan was reviewed and approved by Dr. Sherwood Gambler from neurosurgery and radiation oncology prior to treatment. It showed 3-dimensional radiation distributions overlaid onto the planning CT/MRI image set. The [facility] for the target structures as well as the organs at risk were reviewed. The documentation of the 3D plan and dosimetry are filed in the radiation oncology EMR.   NARRATIVE: The patient was brought to the TrueBeam stereotactic radiation treatment machine and placed supine on the CT couch. The head frame was applied, and the patient was set up for stereotactic radiosurgery. Neurosurgery was present for the set-up and delivery   SIMULATION VERIFICATION: In the couch zero-angle position, the patient underwent Exactrac imaging using the Brainlab system with orthogonal KV images. These were carefully aligned and repeated to confirm treatment position for each of the isocenters. The Exactrac snap film verification was repeated at each couch angle.   SPECIAL TREATMENT PROCEDURE: The patient received stereotactic radiosurgery to the following target:  PTV targets 1-6 were treated using 6 Arcs to a prescription dose of 20 Gy. ExacTrac Snap verification was performed for each couch angle.   STEREOTACTIC TREATMENT MANAGEMENT: Following delivery, the patient was transported to nursing in stable condition and monitored for possible acute effects. Vital signs were recorded . The patient tolerated treatment without significant acute effects, and was discharged to home in stable condition.  PLAN: Follow-up in one month.   ------------------------------------------------  Jodelle Gross, MD, PhD

## 2019-03-30 NOTE — Progress Notes (Signed)
  Radiation Oncology         [phone] ________________________________  Name: Susan Holder MRN: [id number]  Date: 03/09/2019  DOB: 05-Dec-1955  End of Treatment Note  Diagnosis:      ICD-10-CM   1. Brain metastases [facility])  C79.31         Indication for treatment:  palliative       Radiation treatment dates:   03/09/19  Site/dose:    PTV targets 1-6 were treated using 6 Arcs to a prescription dose of 20 Gy. ExacTrac Snap verification was performed for each couch angle  Narrative: The patient tolerated radiation treatment well.   There were no signs of acute toxicity after treatment.  Plan: The patient has completed radiation treatment. The patient will return to radiation oncology clinic for routine followup in one month. I advised the patient to call or return sooner if they have any questions or concerns related to their recovery or treatment. ________________________________  ------------------------------------------------  Jodelle Gross, MD, PhD

## 2019-04-05 ENCOUNTER — Telehealth: Payer: Self-pay | Admitting: Radiation Oncology

## 2019-04-13 ENCOUNTER — Other Ambulatory Visit: Payer: Self-pay | Admitting: Radiation Therapy

## 2019-04-13 ENCOUNTER — Telehealth: Payer: Self-pay | Admitting: Radiation Therapy

## 2019-04-13 DIAGNOSIS — C7931 Secondary malignant neoplasm of brain: Secondary | ICD-10-CM

## 2019-04-13 DIAGNOSIS — C7949 Secondary malignant neoplasm of other parts of nervous system: Secondary | ICD-10-CM

## 2019-04-13 NOTE — Telephone Encounter (Signed)
Spoke with pt about her upcoming Brain MRI on 2/11 and follow-up call with Bryson Ha on 2/15. She is aware that the 2/15 follow-up is not in person and the time of day for the call will vary based on Alison's availability.   Mont Dutton R.T.(R)(T) Radiation Special Procedures Navigator

## 2019-04-13 NOTE — Telephone Encounter (Signed)
  Radiation Oncology         [phone] ________________________________  Name: Susan Holder MRN: [id number]  Date of Service: 04/05/2019  DOB: [birth year]  Post Treatment Telephone Note  Diagnosis:   Progressive MetastaticStage IIIA, cT3N1-2M0 NSCLC, adenocarcinoma of the right lower lobe with brain metastases  Interval Since Last Radiation: 5 weeks   03/09/2019 SRS Treatment: Each site was treated to 20 Gy in a single fraction PTV1 Post Cerebellum 63mm PTV2 Lt Cerebellum 42mm PTV3 Lo Rt Vent 20mm PTV4 Rt Frontal 50mm PTV5 Lt Frontal Operc 80mm PTV6 Lt Sylv 52mm  11/16/2018-12/20/2018: The patient's right supraclavicular nodes were treated to 60 Gy over 25 fractions.  11/30/17-01/11/18:  66 Gy to the right chest and regional nodes over 33 fractions  Narrative:  The patient was contacted today for routine follow-up. During treatment she did very well with radiotherapy and did not have significant desquamation. She reports she is doing well without symptoms of concern. She has been somewhat tired but states she's been taking it easy.   Impression/Plan: 1. Progressive MetastaticStage IIIA, cT3N1-2M0 NSCLC, adenocarcinoma of the right lower lobe with brain metastases. The patient has been doing well since completion of radiotherapy. We discussed that we would plan to repeat her brain MRI in about 2 months and she is already scheduled for this scan in February 2021. She will also continue to follow up with Dr. Julien Nordmann.     Carola Rhine, PAC

## 2019-05-09 ENCOUNTER — Ambulatory Visit (HOSPITAL_COMMUNITY)
Admission: RE | Admit: 2019-05-09 | Discharge: 2019-05-09 | Disposition: A | Discharge status: Home / Self Care | Payer: Medicare PPO | Source: Ambulatory Visit | Attending: Physician Assistant | Admitting: Physician Assistant

## 2019-05-09 ENCOUNTER — Encounter (HOSPITAL_COMMUNITY): Payer: Self-pay

## 2019-05-09 ENCOUNTER — Inpatient Hospital Stay: Payer: Medicare PPO | Attending: Internal Medicine

## 2019-05-09 ENCOUNTER — Other Ambulatory Visit: Payer: Self-pay

## 2019-05-09 DIAGNOSIS — C3431 Malignant neoplasm of lower lobe, right bronchus or lung: Secondary | ICD-10-CM | POA: Diagnosis not present

## 2019-05-09 DIAGNOSIS — Z923 Personal history of irradiation: Secondary | ICD-10-CM | POA: Insufficient documentation

## 2019-05-09 DIAGNOSIS — R112 Nausea with vomiting, unspecified: Secondary | ICD-10-CM | POA: Diagnosis not present

## 2019-05-09 DIAGNOSIS — Z9221 Personal history of antineoplastic chemotherapy: Secondary | ICD-10-CM | POA: Insufficient documentation

## 2019-05-09 DIAGNOSIS — Z7982 Long term (current) use of aspirin: Secondary | ICD-10-CM | POA: Insufficient documentation

## 2019-05-09 DIAGNOSIS — C770 Secondary and unspecified malignant neoplasm of lymph nodes of head, face and neck: Secondary | ICD-10-CM | POA: Insufficient documentation

## 2019-05-09 DIAGNOSIS — C3491 Malignant neoplasm of unspecified part of right bronchus or lung: Secondary | ICD-10-CM

## 2019-05-09 DIAGNOSIS — C7931 Secondary malignant neoplasm of brain: Secondary | ICD-10-CM | POA: Insufficient documentation

## 2019-05-09 DIAGNOSIS — Z79899 Other long term (current) drug therapy: Secondary | ICD-10-CM | POA: Insufficient documentation

## 2019-05-09 DIAGNOSIS — Z791 Long term (current) use of non-steroidal anti-inflammatories (NSAID): Secondary | ICD-10-CM | POA: Diagnosis not present

## 2019-05-09 DIAGNOSIS — M069 Rheumatoid arthritis, unspecified: Secondary | ICD-10-CM | POA: Insufficient documentation

## 2019-05-09 HISTORY — DX: Malignant (primary) neoplasm, unspecified: C80.1

## 2019-05-09 LAB — CMP (CANCER CENTER ONLY)
ALT: 12 U/L (ref 0–44)
AST: 20 U/L (ref 15–41)
Albumin: 3.9 g/dL (ref 3.5–5.0)
Alkaline Phosphatase: 105 U/L (ref 38–126)
Anion gap: 11 (ref 5–15)
BUN: 10 mg/dL (ref 8–23)
CO2: 23 mmol/L (ref 22–32)
Calcium: 9.5 mg/dL (ref 8.9–10.3)
Chloride: 106 mmol/L (ref 98–111)
Creatinine: 0.81 mg/dL (ref 0.44–1.00)
GFR, Est AFR Am: >60 mL/min (ref >60)
GFR, Estimated: >60 mL/min (ref >60)
Glucose, Bld: 99 mg/dL (ref 70–99)
Potassium: 4.1 mmol/L (ref 3.5–5.1)
Sodium: 140 mmol/L (ref 135–145)
Total Bilirubin: 0.3 mg/dL (ref 0.3–1.2)
Total Protein: 7.8 g/dL (ref 6.5–8.1)

## 2019-05-09 LAB — CBC WITH DIFFERENTIAL (CANCER CENTER ONLY)
Abs Immature Granulocytes: 0.03 10*3/uL (ref 0.00–0.07)
Basophils Absolute: 0 10*3/uL (ref 0.0–0.1)
Basophils Relative: 0 %
Eosinophils Absolute: 0 10*3/uL (ref 0.0–0.5)
Eosinophils Relative: 0 %
HCT: 41.7 % (ref 36.0–46.0)
Hemoglobin: 13.6 g/dL (ref 12.0–15.0)
Immature Granulocytes: 0 %
Lymphocytes Relative: 10 %
Lymphs Abs: 0.9 10*3/uL (ref 0.7–4.0)
MCH: 31.5 pg (ref 26.0–34.0)
MCHC: 32.6 g/dL (ref 30.0–36.0)
MCV: 96.5 fL (ref 80.0–100.0)
Monocytes Absolute: 0.6 10*3/uL (ref 0.1–1.0)
Monocytes Relative: 7 %
Neutro Abs: 7 10*3/uL (ref 1.7–7.7)
Neutrophils Relative %: 83 %
Platelet Count: 247 10*3/uL (ref 150–400)
RBC: 4.32 MIL/uL (ref 3.87–5.11)
RDW: 13.2 % (ref 11.5–15.5)
WBC Count: 8.5 10*3/uL (ref 4.0–10.5)
nRBC: 0 % (ref 0.0–0.2)

## 2019-05-09 MED ORDER — IOHEXOL 300 MG/ML  SOLN
75.0000 mL | Freq: Once | INTRAMUSCULAR | Status: AC | PRN
  Administered 2019-05-09: 75 mL via INTRAVENOUS

## 2019-05-09 MED ORDER — SODIUM CHLORIDE (PF) 0.9 % IJ SOLN
INTRAMUSCULAR | Status: AC
  Filled 2019-05-09: qty 50

## 2019-05-11 ENCOUNTER — Encounter: Payer: Self-pay | Admitting: Internal Medicine

## 2019-05-11 ENCOUNTER — Inpatient Hospital Stay (HOSPITAL_BASED_OUTPATIENT_CLINIC_OR_DEPARTMENT_OTHER): Payer: Medicare PPO | Admitting: Internal Medicine

## 2019-05-11 ENCOUNTER — Other Ambulatory Visit: Payer: Self-pay

## 2019-05-11 VITALS — BP 105/69 | HR 116 | Temp 97.9°F | Resp 20 | Ht 61.0 in | Wt 139.8 lb

## 2019-05-11 DIAGNOSIS — C770 Secondary and unspecified malignant neoplasm of lymph nodes of head, face and neck: Secondary | ICD-10-CM | POA: Diagnosis not present

## 2019-05-11 DIAGNOSIS — C7931 Secondary malignant neoplasm of brain: Secondary | ICD-10-CM

## 2019-05-11 DIAGNOSIS — C3490 Malignant neoplasm of unspecified part of unspecified bronchus or lung: Secondary | ICD-10-CM

## 2019-05-11 DIAGNOSIS — C3491 Malignant neoplasm of unspecified part of right bronchus or lung: Secondary | ICD-10-CM

## 2019-05-11 DIAGNOSIS — C3431 Malignant neoplasm of lower lobe, right bronchus or lung: Secondary | ICD-10-CM | POA: Diagnosis not present

## 2019-05-11 MED ORDER — PROCHLORPERAZINE MALEATE 10 MG PO TABS: 10.0000 mg | ORAL_TABLET | Freq: Four times a day (QID) | ORAL | 0 refills | Status: DC | PRN

## 2019-05-11 NOTE — Progress Notes (Signed)
[place] [phone]   [phone]  OFFICE PROGRESS NOTE  Patient, No Pcp Per No address on file  DIAGNOSIS: Metastatic non-small cell lung cancer initially diagnosed as stage IIIA (T3, N1/N2, M0) non-small cell lung cancer, adenocarcinoma presented with large right lower lobe lung mass with extension to the right hilum and subcarinal area diagnosed in July 2019.  She has brain metastasis in October 2020.  Biomarker Findings Tumor Mutational Burden - TMB-Intermediate (6 Muts/Mb) Microsatellite status - MS-Stable Genomic Findings For a complete list of the genes assayed, please refer to the Appendix. NRAS Q61R ARAF amplification STK11 G56W KRAS G13D MYCN amplification MCL1 amplification NKX2-1 amplification - equivocal? TP53 G245V 7 Disease relevant genes with no reportable alterations: EGFR, ALK, BRAF, MET, ERBB2, RET, ROS1   PRIOR THERAPY:  1) Course of concurrent chemoradiation with weekly carboplatin for AUC of 2 and paclitaxel 45 mg/M2.  Status post 7 cycles.  Last dose was giving 01/11/2018. 2) Consolidation treatment with immunotherapy with Imfinzi (Durvalumab) 10 mg/KG every 2 weeks.  First dose February 09, 2018.  Status post 19 cycles. 3) status post stereotactic body radiotherapy to the enlarging right supraclavicular lymphadenopathy under the care of Dr. Lisbeth Renshaw. 4) SRS to multiple brain metastasis under the care of Dr. Lisbeth Renshaw.  CURRENT THERAPY: Observation.   INTERVAL HISTORY: Susan Holder 64 y.o. female returns to the clinic today for follow-up visit.  The patient is feeling fine today with no concerning complaints except for nausea started yesterday with few episodes of vomiting.  She denied having any abdominal pain, diarrhea or constipation.  She denied having any chest pain, shortness of breath, cough or hemoptysis.  No significant weight loss or night sweats.  She has no associated headache or visual changes.  She has  been on observation the last few months.  She had repeat CT scan of the neck and the chest performed recently and she is here for evaluation and discussion of her scan results.   MEDICAL HISTORY: Past Medical History:  Diagnosis Date  . met lung ca dx'd 09/2017   neck LN and brain 2020  . Rheumatoid arthritis ([place])     ALLERGIES:  has No Known Allergies.  MEDICATIONS:  Current Outpatient Medications  Medication Sig Dispense Refill  . aspirin EC 81 MG tablet Take 81 mg by mouth daily.    . diclofenac sodium (VOLTAREN) 1 % GEL Apply 2 g topically 4 (four) times daily.    . hydroxychloroquine (PLAQUENIL) 200 MG tablet TAKE 1 TABLET BY MOUTH TWICE DAILY MONDAY THROUGH FRIDAY 120 tablet 0  . lidocaine (XYLOCAINE) 2 % solution Use as directed 10-15 mLs in the mouth or throat every 6 (six) hours as needed for mouth pain. 200 mL 1  . naproxen sodium (ALEVE) 220 MG tablet Take 220 mg by mouth daily as needed (PAIN).    Marland Kitchen omeprazole (PRILOSEC) 20 MG capsule Take 20 mg by mouth daily.    Marland Kitchen Phenylephrine-DM-GG-APAP (DELSYM COUGH/COLD DAYTIME PO) Take by mouth as needed.     . pseudoephedrine-guaifenesin (MUCINEX D) 60-600 MG 12 hr tablet Take 1 tablet by mouth as needed.     Marland Kitchen VITAMIN D PO Take by mouth.     No current facility-administered medications for this visit.    SURGICAL HISTORY:  Past Surgical History:  Procedure Laterality Date  . BACK SURGERY    . BRONCHIAL NEEDLE ASPIRATION BIOPSY  11/09/2017   Procedure: BRONCHIAL NEEDLE ASPIRATION BIOPSIES;  Surgeon:  Marshell Garfinkel, MD;  Location: [facility];  Service: Cardiopulmonary;;  . ENDOBRONCHIAL ULTRASOUND Bilateral 11/09/2017   Procedure: ENDOBRONCHIAL ULTRASOUND;  Surgeon: Marshell Garfinkel, MD;  Location: [facility];  Service: Cardiopulmonary;  Laterality: Bilateral;  . NASAL SINUS SURGERY    . NASAL SINUS SURGERY    . TUBAL LIGATION    . VIDEO BRONCHOSCOPY  11/09/2017   Procedure: VIDEO BRONCHOSCOPY;  Surgeon: Marshell Garfinkel,  MD;  Location: [facility];  Service: Cardiopulmonary;;    REVIEW OF SYSTEMS:  Constitutional: negative Eyes: negative Ears, nose, mouth, throat, and face: negative Respiratory: negative Cardiovascular: negative Gastrointestinal: positive for nausea Genitourinary:negative Integument/breast: negative Hematologic/lymphatic: negative Musculoskeletal:negative Neurological: negative Behavioral/Psych: negative Endocrine: negative Allergic/Immunologic: negative   PHYSICAL EXAMINATION: General appearance: alert, cooperative and no distress Head: Normocephalic, without obvious abnormality, atraumatic Neck: no adenopathy, no JVD, supple, symmetrical, trachea midline and thyroid not enlarged, symmetric, no tenderness/mass/nodules Lymph nodes: Cervical, supraclavicular, and axillary nodes normal. Resp: clear to auscultation bilaterally Back: symmetric, no curvature. ROM normal. No CVA tenderness. Cardio: regular rate and rhythm, S1, S2 normal, no murmur, click, rub or gallop GI: soft, non-tender; bowel sounds normal; no masses,  no organomegaly Extremities: extremities normal, atraumatic, no cyanosis or edema Neurologic: Alert and oriented X 3, normal strength and tone. Normal symmetric reflexes. Normal coordination and gait  ECOG PERFORMANCE STATUS: 1 - Symptomatic but completely ambulatory  Blood pressure 105/69, pulse (!) 116, temperature 97.9 F (36.6 C), temperature source Temporal, resp. rate 20, height 5' 1"  (1.549 m), weight 139 lb 12.8 oz (63.4 kg), last menstrual period 04/28/2000, SpO2 100 %.  LABORATORY DATA: Lab Results  Component Value Date   WBC 8.5 05/09/2019   HGB 13.6 05/09/2019   HCT 41.7 05/09/2019   MCV 96.5 05/09/2019   PLT 247 05/09/2019      Chemistry      Component Value Date/Time   NA 140 05/09/2019 0937   K 4.1 05/09/2019 0937   CL 106 05/09/2019 0937   CO2 23 05/09/2019 0937   BUN 10 05/09/2019 0937   CREATININE 0.81 05/09/2019 0937   CREATININE  0.80 07/24/2017 1438      Component Value Date/Time   CALCIUM 9.5 05/09/2019 0937   ALKPHOS 105 05/09/2019 0937   AST 20 05/09/2019 0937   ALT 12 05/09/2019 0937   BILITOT 0.3 05/09/2019 0937       RADIOGRAPHIC STUDIES: CT Soft Tissue Neck W Contrast  Result Date: 05/09/2019 CLINICAL DATA:  Adenocarcinoma of right lung, stage III. Restaging lung cancer. EXAM: CT NECK WITH CONTRAST TECHNIQUE: Multidetector CT imaging of the neck was performed using the standard protocol following the bolus administration of intravenous contrast. CONTRAST:  98m OMNIPAQUE IOHEXOL 300 MG/ML  SOLN COMPARISON:  Neck CT 02/04/2019, brain MRI 02/23/2019 FINDINGS: Pharynx and larynx: No appreciable swelling or discrete mass within the oral cavity, pharynx or larynx. Salivary glands: No inflammation, mass, or stone. Thyroid: Negative Lymph nodes: Continued interval decrease in size of a right supraclavicular lymph node/nodal mass since prior neck CT 02/04/2019, now measuring 2.1 x 1.4 cm in transaxial dimensions (series 2, images 72 and 73) (previously 2.8 x 2.2 cm). There is no longer mass effect upon the right thyroid lobe. As before, the node/nodal mass abuts portions of the innominate artery, common carotid artery and caudal internal jugular vein on the right. No new pathologically enlarged cervical chain lymph nodes are identified. Vascular: The major vascular structures of the neck appear patent. Atherosclerotic disease. Limited intracranial: Multiple cerebellar metastases again seen,  although are poorly reassessed on the current study. Visualized orbits: Incompletely imaged.  No abnormality identified. Mastoids and visualized paranasal sinuses: No significant paranasal sinus disease or mastoid effusion at the imaged levels. Skeleton: No acute bony abnormality or suspicious osseous lesion. Reversal of the expected cervical lordosis. Cervical spondylosis without high-grade bony spinal canal narrowing. Upper chest: Please  refer to concurrent chest CT for description of findings below the level of the thoracic inlet. IMPRESSION: Continued interval decrease in size of a right supraclavicular lymph node/nodal mass, now measuring 2.1 x 1.4 cm in transaxial dimensions (previously 2.8 x 2.2 cm). New new cervical lymphadenopathy is identified. Multiple cerebellar metastases are again seen, although poorly reassessed on the current study. Please refer to separately reported chest CT for a description of intrathoracic findings. Electronically Signed   By: Kellie Simmering DO   On: 05/09/2019 11:32   CT Chest W Contrast  Result Date: 05/09/2019 CLINICAL DATA:  Metastatic lung cancer. Restaging EXAM: CT CHEST WITH CONTRAST TECHNIQUE: Multidetector CT imaging of the chest was performed during intravenous contrast administration. CONTRAST:  72m OMNIPAQUE IOHEXOL 300 MG/ML  SOLN COMPARISON:  02/04/2019 FINDINGS: Cardiovascular: The heart size is normal. No pericardial effusion identified. Aortic atherosclerosis. RCA coronary artery calcifications. Mediastinum/Nodes: Normal appearance of the thyroid gland. The trachea appears patent and is midline. Normal appearance of the esophagus. No enlarged mediastinal or hilar lymph nodes. Lungs/Pleura: There is no pleural effusion identified. Moderate changes of centrilobular emphysema. Fibrosis and masslike architectural distortion within the perihilar right lung is again noted and appears similar to the previous exam compatible with changes secondary to external beam radiation. Decrease interstitial opacities and improved aeration within the superior segment of the right lower lobe. No specific findings within the right lung to suggest residual tumor. Faint sub solid nodule within the left lower lobe measures 4 mm and appears similar to previous exam, image 69/2. A more solid-appearing nodule is noted within the anterior left lower lobe measuring 4 mm, image 69/6. This was not seen on previous exam. Upper  Abdomen: No acute abnormality. Musculoskeletal: No chest wall abnormality. No acute or significant osseous findings. IMPRESSION: 1. Stable post treatment changes within the right lung. No specific findings identified to suggest residual or recurrent tumor to the right lung. 2. There is a new solid-appearing nodule within the anterior left lower lobe measuring 4 mm. Attention on follow-up imaging is advised. Unchanged sub solid central left lower lobe lung nodule measuring 4 mm. 3. Emphysema and aortic atherosclerosis. RCA coronary artery calcifications noted. Aortic Atherosclerosis (ICD10-I70.0) and Emphysema (ICD10-J43.9). Electronically Signed   By: TKerby MoorsM.D.   On: 05/09/2019 12:00    ASSESSMENT AND PLAN: This is a very pleasant 64years old African-American female with metastatic non-small cell lung cancer initially diagnosed with a stage IIIA non-small cell lung cancer, adenocarcinoma.  She underwent a course of concurrent chemoradiation with weekly carboplatin and paclitaxel status post 7 cycles with partial response.   The patient tolerated this course of treatment well except for mild odynophagia and dysphagia. She completed on consolidation treatment with immunotherapy with Imfinzi (Durvalumab) status post 18 cycles. She also completed SBRT to the right supraclavicular lymphadenopathy. The patient had evidence for multiple brain metastasis in October 2020 and she underwent SRS treatment to this lesion under the care of Dr. MLisbeth Renshaw She is currently on observation and she is feeling fine except for the few episodes of nausea started yesterday. She had repeat CT scan of the neck and chest  performed recently.  I personally and independently reviewed the scan images and discussed the results with the patient today. Her scan showed no concerning findings for disease progression or metastasis except for new 4 mm nodule in the anterior left lower lobe that need close observation on follow-up  imaging studies. For the nausea I will give the patient refill of her nausea medication with Compazine.  She was also advised to call immediately if she continues to have persistent nausea and vomiting as the patient may need repeat MRI of the brain sooner to rule out any new or worsening brain metastasis. I will see her back for follow-up visit in 3 months for evaluation with repeat CT scan of the neck and the chest for restaging of her disease. She was advised to call immediately if she has any concerning symptoms in the interval. The patient voices understanding of current disease status and treatment options and is in agreement with the current care plan. All questions were answered. The patient knows to call the clinic with any problems, questions or concerns. We can certainly see the patient much sooner if necessary.  Disclaimer: This note was dictated with voice recognition software. Similar sounding words can inadvertently be transcribed and may not be corrected upon review.

## 2019-05-18 ENCOUNTER — Telehealth: Payer: Self-pay | Admitting: Radiation Therapy

## 2019-05-18 NOTE — Telephone Encounter (Signed)
Checked in with Susan Holder to see if her nausea has improved. She was very happy to say that is has. Dr. Julien Nordmann prescribed her some compazine during her last visit, but she has not needed to take any this week. I asked her to please let us know if this changes or if she would like her scheduled brain MRI to be done sooner. She was thankful for the call.  Mont Dutton R.T.(R)(T) Radiation Special Procedures Navigator

## 2019-05-19 ENCOUNTER — Other Ambulatory Visit: Payer: Self-pay | Admitting: Internal Medicine

## 2019-05-30 ENCOUNTER — Telehealth: Payer: Self-pay | Admitting: Medical Oncology

## 2019-05-30 NOTE — Telephone Encounter (Signed)
Returned phone to pt who is asking if okay to get COVID vaccine. She is scheduled this week. I told her per Dr Julien Nordmann it is okay to get the COVID vaccine.

## 2019-06-08 NOTE — Progress Notes (Signed)
Office Visit Note  Patient: Susan Holder             Date of Birth: 1955/07/12           MRN: [id number]             PCP: Patient, No Pcp Per Referring: No ref. provider found Visit Date: 06/14/2019 Occupation: '@GUAROCC'$ @  Subjective:  Rheumatoid Arthritis (Improving, right ankle pain)   History of Present Illness: Susan Holder is a 64 y.o. female with history of rheumatoid arthritis.  She states she is feeling better compared to the last visit.  She has stopped the immunotherapy now which could have been contributing to her joint pain.  She denies any joint swelling except for some discomfort in her right ankle joint currently.  Activities of Daily Living:  Patient reports morning stiffness for 0 none.   Patient Denies nocturnal pain.  Difficulty dressing/grooming: Denies Difficulty climbing stairs: Denies Difficulty getting out of chair: Denies Difficulty using hands for taps, buttons, cutlery, and/or writing: Denies  Review of Systems  Constitutional: Positive for fatigue. Negative for night sweats, weight gain and weight loss.  HENT: Negative for mouth sores, trouble swallowing, trouble swallowing, mouth dryness and nose dryness.   Eyes: Negative for pain, redness, visual disturbance and dryness.  Respiratory: Negative for cough, shortness of breath and difficulty breathing.   Cardiovascular: Negative for chest pain, palpitations, hypertension, irregular heartbeat and swelling in legs/feet.  Gastrointestinal: Negative for blood in stool, constipation and diarrhea.  Endocrine: Negative for increased urination.  Genitourinary: Negative for difficulty urinating and vaginal dryness.  Musculoskeletal: Positive for arthralgias, joint pain and joint swelling. Negative for myalgias, muscle weakness, morning stiffness, muscle tenderness and myalgias.  Skin: Negative for color change, rash, hair loss, skin tightness, ulcers and sensitivity to sunlight.  Allergic/Immunologic:  Negative for susceptible to infections.  Neurological: Negative for dizziness, numbness, memory loss, night sweats and weakness.  Hematological: Negative for bruising/bleeding tendency and swollen glands.  Psychiatric/Behavioral: Negative for depressed mood and sleep disturbance. The patient is not nervous/anxious.     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[place]) 11/04/2018   Acid reflux 09/21/2018   Hoarseness 03/16/2018   Lung cancer ([place]) 03/16/2018   Encounter for antineoplastic immunotherapy 02/01/2018   Primary malignant neoplasm of bronchus of right lower lobe ([place]) 12/21/2017   Adenocarcinoma of right lung, stage 3 ([place]) 11/17/2017   Encounter for antineoplastic chemotherapy 11/17/2017   Goals of care, counseling/discussion 11/17/2017   Lung mass    Left hand pain 03/06/2017   Rheumatoid arthritis involving multiple sites with positive rheumatoid factor ([place]) 05/29/2016   ANA positive 05/29/2016   Vitamin D deficiency 05/29/2016   High risk medication use 05/29/2016   Trigger finger, left ring finger 05/29/2016   Trigger finger, right ring finger 05/29/2016   DDD (degenerative disc disease), cervical 05/29/2016   Smoker 05/29/2016   Other fatigue 05/29/2016   Allergic rhinitis 08/22/2013    Past Medical History:  Diagnosis Date   met lung ca dx'd 09/2017   neck LN and brain 2020   Rheumatoid arthritis ([place])     Family History  Problem Relation Age of Onset   Stroke Mother    Alzheimer's disease Mother    Heart disease Mother    Emphysema Father    Hypertension Brother    Heart attack Maternal Aunt  Heart failure Maternal Grandmother    Hypertension Paternal Grandmother    Past Surgical History:  Procedure Laterality Date   BACK SURGERY     BRONCHIAL NEEDLE ASPIRATION BIOPSY  11/09/2017   Procedure: BRONCHIAL NEEDLE  ASPIRATION BIOPSIES;  Surgeon: Marshell Garfinkel, MD;  Location: [facility];  Service: Cardiopulmonary;;   ENDOBRONCHIAL ULTRASOUND Bilateral 11/09/2017   Procedure: ENDOBRONCHIAL ULTRASOUND;  Surgeon: Marshell Garfinkel, MD;  Location: [facility];  Service: Cardiopulmonary;  Laterality: Bilateral;   NASAL SINUS SURGERY     NASAL SINUS SURGERY     TUBAL LIGATION     VIDEO BRONCHOSCOPY  11/09/2017   Procedure: VIDEO BRONCHOSCOPY;  Surgeon: Marshell Garfinkel, MD;  [facility]: [facility];  Service: Cardiopulmonary;;   Social History   Social History Narrative   Not on file   Immunization History  Administered Date(s) Administered   Influenza,inj,Quad PF,6+ Mos 03/08/2018     Objective: Vital Signs: BP 108/70 (BP Location: Left Arm, Patient Position: Sitting, Cuff Size: Normal)    Pulse (!) 104    Resp 16    Ht 5' (1.524 m)    Wt 148 lb 9.6 oz (67.4 kg)    LMP 04/28/2000    BMI 29.02 kg/m    Physical Exam Vitals and nursing note reviewed.  Constitutional:      Appearance: She is well-developed.  HENT:     Head: Normocephalic and atraumatic.  Eyes:     Conjunctiva/sclera: Conjunctivae normal.  Cardiovascular:     Rate and Rhythm: Normal rate and regular rhythm.     Heart sounds: Normal heart sounds.  Pulmonary:     Effort: Pulmonary effort is normal.     Breath sounds: Normal breath sounds.  Abdominal:     General: Bowel sounds are normal.     Palpations: Abdomen is soft.  Musculoskeletal:     Cervical back: Normal range of motion.  Lymphadenopathy:     Cervical: No cervical adenopathy.  Skin:    General: Skin is warm and dry.     Capillary Refill: Capillary refill takes less than 2 seconds.  Neurological:     Mental Status: She is alert and oriented to person, place, and time.  Psychiatric:        Behavior: Behavior normal.      Musculoskeletal Exam: C-spine was in range of motion.  Shoulder joints elbow joints wrist joints MCPs PIPs DIPs with good range of motion.   She has thickening of bilateral first MCP joint with no synovitis.  Hip joints, knee joints, ankle joints, MTPs and PIPs were in good range of motion with no synovitis.  Some synovial thickening was noted.  CDAI Exam: CDAI Score: 0.1  Patient Global: 0 mm; Provider Global: 1 mm Swollen: 0 ; Tender: 0  Joint Exam 06/14/2019   No joint exam has been documented for this visit   There is currently no information documented on the homunculus. Go to the Rheumatology activity and complete the homunculus joint exam.  Investigation: No additional findings.  Imaging: MR Brain W Wo Contrast  Result Date: 06/09/2019 CLINICAL DATA:  Metastatic lung carcinoma. Stereotactic radio surgery follow-up. EXAM: MRI HEAD WITHOUT AND WITH CONTRAST TECHNIQUE: Multiplanar, multiecho pulse sequences of the brain and surrounding structures were obtained without and with intravenous contrast. CONTRAST:  32m MULTIHANCE GADOBENATE DIMEGLUMINE 529 MG/ML IV SOLN COMPARISON:  02/23/2019 FINDINGS: Brain: No acute infarct, acute hemorrhage or extra-axial collection. There is multifocal cerebral white matter hyperintense T2-weighted signal in a pattern most commonly associated with  chronic small vessel disease. Normal volume of CSF spaces. No chronic microhemorrhage. Normal midline structures. There are no new contrast-enhancing lesions. The 6 previously demonstrated intracranial metastases have decreased in size and have much less surrounding edema. These are marked with arrows on the postcontrast axial T1-weighted sequence (series 11): 1. Lateral left cerebellum, 6 mm, previously 13 mm, image 27 2. Posteromedial left cerebellum, 6 mm, previously 12 mm, image 21 3. Medial right cerebellum adjacent to the fourth ventricle, 5 mm, previously 7 mm 4. Inferior right frontal lobe, 3 mm, previously 5 mm, image 64 5. Left frontal operculum, 3 mm, previously 5 mm, image 83 6. Posterior left frontal white matter, 2 mm, previously 5 mm, image  85 Vascular: Normal flow voids. Skull and upper cervical spine: Normal marrow signal. Sinuses/Orbits: Negative. Other: None. IMPRESSION: Positive treatment response with decreased size of all previously demonstrated intracranial metastatic lesions. Electronically Signed   By: Ulyses Jarred M.D.   On: 06/09/2019 15:20    Recent Labs: Lab Results  Component Value Date   WBC 8.5 05/09/2019   HGB 13.6 05/09/2019   PLT 247 05/09/2019   NA 140 05/09/2019   K 4.1 05/09/2019   CL 106 05/09/2019   CO2 23 05/09/2019   GLUCOSE 99 05/09/2019   BUN 10 05/09/2019   CREATININE 0.81 05/09/2019   BILITOT 0.3 05/09/2019   ALKPHOS 105 05/09/2019   AST 20 05/09/2019   ALT 12 05/09/2019   PROT 7.8 05/09/2019   ALBUMIN 3.9 05/09/2019   CALCIUM 9.5 05/09/2019   GFRAA >60 05/09/2019    Speciality Comments: PLQ eye exam: 02/03/2019 WNL @ [facility].  Procedures:  No procedures performed Allergies: Patient has no known allergies.   Assessment / Plan:     Visit Diagnoses: Rheumatoid arthritis involving multiple sites with positive rheumatoid factor (HCC) - +RF, +anti-CCP, +ANA with nodulosis -patient is doing well clinically with no synovitis on examination today.  I obtain x-rays to look for any radiographic progression.  Plan: XR Hand 2 View Right, XR Hand 2 View Left, XR Foot 2 Views Right, XR Foot 2 Views Left.  There are no new erosive changes.  Some MCP narrowing was noted.  No radiographic progression was noted when compared to the films of 2018.  Feet x-rays were consistent with osteoarthritis.  High risk medication use - Plaquenil 200 mg 1 tablet twice daily Monday through Friday only.eye exam: 02/03/2019.  Labs are up-to-date.  We will get labs with her follow-up visit.  DDD (degenerative disc disease), cervical-she has good range of motion today.  Vitamin D deficiency-she is taking vitamin D supplement.  Primary malignant neoplasm of bronchus of right lower lobe (HCC)-patient recently finished  immunotherapy.  Adenocarcinoma of right lung, stage 3 (HCC)   Orders: Orders Placed This Encounter  Procedures   XR Hand 2 View Right   XR Hand 2 View Left   XR Foot 2 Views Right   XR Foot 2 Views Left   No orders of the defined types were placed in this encounter.   Face-to-face time spent with patient was  minutes. Greater than 50% of time was spent in counseling and coordination of care.  Follow-Up Instructions: Return in about 5 months (around 11/11/2019) for Rheumatoid arthritis.   Bo Merino, MD  Note - This record has been created using Editor, commissioning.  Chart creation errors have been sought, but may not always  have been located. Such creation errors do not reflect on  the standard of medical  care.

## 2019-06-09 ENCOUNTER — Ambulatory Visit
Admission: RE | Admit: 2019-06-09 | Discharge: 2019-06-09 | Disposition: A | Discharge status: Home / Self Care | Payer: Medicare PPO | Source: Ambulatory Visit | Attending: Radiation Oncology | Admitting: Radiation Oncology

## 2019-06-09 DIAGNOSIS — C7931 Secondary malignant neoplasm of brain: Secondary | ICD-10-CM

## 2019-06-09 DIAGNOSIS — C7949 Secondary malignant neoplasm of other parts of nervous system: Secondary | ICD-10-CM

## 2019-06-09 MED ORDER — GADOBENATE DIMEGLUMINE 529 MG/ML IV SOLN
12.0000 mL | Freq: Once | INTRAVENOUS | Status: AC | PRN
  Administered 2019-06-09: 12 mL via INTRAVENOUS

## 2019-06-11 ENCOUNTER — Other Ambulatory Visit: Payer: Self-pay | Admitting: Rheumatology

## 2019-06-11 DIAGNOSIS — M0579 Rheumatoid arthritis with rheumatoid factor of multiple sites without organ or systems involvement: Secondary | ICD-10-CM

## 2019-06-13 ENCOUNTER — Encounter: Payer: Self-pay | Admitting: Radiation Oncology

## 2019-06-13 ENCOUNTER — Other Ambulatory Visit: Payer: Self-pay

## 2019-06-13 ENCOUNTER — Ambulatory Visit
Admission: RE | Admit: 2019-06-13 | Discharge: 2019-06-13 | Disposition: A | Discharge status: Home / Self Care | Payer: Medicare PPO | Source: Ambulatory Visit | Attending: Radiation Oncology | Admitting: Radiation Oncology

## 2019-06-13 ENCOUNTER — Telehealth: Payer: Self-pay | Admitting: *Deleted

## 2019-06-13 ENCOUNTER — Inpatient Hospital Stay: Payer: Medicare PPO

## 2019-06-13 DIAGNOSIS — C770 Secondary and unspecified malignant neoplasm of lymph nodes of head, face and neck: Secondary | ICD-10-CM

## 2019-06-13 DIAGNOSIS — C7931 Secondary malignant neoplasm of brain: Secondary | ICD-10-CM

## 2019-06-13 DIAGNOSIS — C3431 Malignant neoplasm of lower lobe, right bronchus or lung: Secondary | ICD-10-CM

## 2019-06-13 NOTE — Telephone Encounter (Signed)
Called patient to inform of appt. with Dr. Wonda Amis on 06-29-19 - arrival time- 2:05 pm- address - [address], [address], spoke with patient and she is aware of this appt.

## 2019-06-13 NOTE — Telephone Encounter (Signed)
Last Visit: 01/10/2019  Next Visit: 06/14/2019 Labs: 05/09/2019 WNL  Eye exam: 02/03/2019   Okay to refill per Dr. Estanislado Pandy.

## 2019-06-13 NOTE — Progress Notes (Addendum)
Radiation Oncology         [phone] ________________________________  Outpatient Follow Up - Conducted via telephone due to current COVID-19 concerns for limiting patient exposure  I spoke with the patient to conduct this consult visit via telephone to spare the patient unnecessary potential exposure in the healthcare setting during the current COVID-19 pandemic. The patient was notified in advance and was offered a [place] meeting to allow for face to face communication but unfortunately reported that they did not have the appropriate resources/technology to support such a visit and instead preferred to proceed with a telephone visit.   ________________________________  Name: Susan Holder MRN: [id number]  Date of Service: 06/13/2019  DOB: 1955-10-21  Post Treatment Telephone Note  DIAGNOSIS:   Progressive MetastaticStage IIIA, cT3N1-2M0 NSCLC, adenocarcinoma of the right lower lobe with brain metastases  INTERVAL SINCE LAST RADIOTHERAPY: 3 months  03/09/2019 SRS Treatment: Each site was treated to 20 Gy in a single fraction PTV1 Post Cerebellum 65m PTV2 Lt Cerebellum 175mPTV3 Lo Rt Vent 43m39mTV4 Rt Frontal 5mm57mV5 Lt Frontal Operc 5mm 45m6 Lt Sylv 5mm  39m1/2020-12/20/2018: The patient's right supraclavicular nodes were treated to 60 Gy over 25 fractions.  11/30/17-01/11/18:  66 Gy to the right chest and regional nodes over 33 fractions  NARRATIVE: Susan Holder patient to our service with a history of adenocarcinoma of the right lower lobe.  When she was originally diagnosed in the summer 2019 she presented with local regional disease and received chemoradiation.  Unfortunately she recurred in the summer 2020, she did receive palliative radiotherapy to the right supraclavicular node, and in November 2020 was found to have 6 lesions in the brain felt to be metastatic for which she underwent SRS treatment in November 2020.  She tolerated this most recent  treatment quite well, and recently underwent an MRI of the brain on 06/08/2018, fortunately her MRI scan showed a positive response to treatment with a decrease in the size of all previously treated lesions majority of which were about 40% reduction in size, the largest of which has been 13 mm prior to treatment in the lateral left cerebellum, now measuring 6 mm.  No other acute or chronic findings were noted.  She continues under the care of Dr. MohameJulien Nordmannbservation of her cancer.  Her most recent systemic infusion was Imfinzi immunotherapy and this was last given on 10/05/2018.  On review of systems, the patient reports that he is doing well overall. She denies any chest pain, shortness of breath,  fevers, chills, night sweats, unintended weight changes. She does have a chronic dry cough. She denies any headaches, visual, or auditory changes. She denies any movement dysfunction. She denies any bowel or bladder disturbances, and denies abdominal pain, nausea or vomiting. She denies any new musculoskeletal or joint aches or pains, new skin lesions or concerns. A complete review of systems is obtained and is otherwise negative.   PAST MEDICAL HISTORY:  Past Medical History:  Diagnosis Date  . met lung ca dx'd 09/2017   neck LN and brain 2020  . Rheumatoid arthritis (HCC)  [place]: Past Surgical History:  Procedure Laterality Date  . BACK SURGERY    . BRONCHIAL NEEDLE ASPIRATION BIOPSY  11/09/2017   Procedure: BRONCHIAL NEEDLE ASPIRATION BIOPSIES;  Surgeon: MannamMarshell Garfinkel [facility];  Service: Cardiopulmonary;;  . ENDOBRONCHIAL ULTRASOUND Bilateral 11/09/2017   Procedure: ENDOBRONCHIAL ULTRASOUND;  Surgeon: MannamMarshell Garfinkel  [facility];  Service: Cardiopulmonary;  Laterality: Bilateral;  . NASAL SINUS SURGERY    . NASAL SINUS SURGERY    . TUBAL LIGATION    . VIDEO BRONCHOSCOPY  11/09/2017   Procedure: VIDEO BRONCHOSCOPY;  Surgeon: Marshell Garfinkel, MD;  [facility];  Service: Cardiopulmonary;;    PAST SOCIAL HISTORY:  Social History   Socioeconomic History  . Marital status: Divorced    Spouse name: Not on file  . Number of children: Not on file  . Years of education: Not on file  . Highest education level: Not on file  Occupational History  . Not on file  Tobacco Use  . Smoking status: Former Smoker    Packs/day: 0.50    Years: 45.00    Pack years: 22.50    Types: Cigarettes    Quit date: 09/29/2017    Years since quitting: 1.7  . Smokeless tobacco: Never Used  Substance and Sexual Activity  . Alcohol use: Not Currently  . Drug use: No  . Sexual activity: Not Currently  Other Topics Concern  . Not on file  Social History Narrative  . Not on file   Social Determinants of Health   Financial Resource Strain:   . Difficulty of Paying Living Expenses: Not on file  Food Insecurity:   . Worried About Charity fundraiser in the Last Year: Not on file  . Ran Out of Food in the Last Year: Not on file  Transportation Needs:   . Lack of Transportation (Medical): Not on file  . Lack of Transportation (Non-Medical): Not on file  Physical Activity:   . Days of Exercise per Week: Not on file  . Minutes of Exercise per Session: Not on file  Stress:   . Feeling of Stress : Not on file  Social Connections:   . Frequency of Communication with Friends and Family: Not on file  . Frequency of Social Gatherings with Friends and Family: Not on file  . Attends Religious Services: Not on file  . Active Member of Clubs or Organizations: Not on file  . Attends Archivist Meetings: Not on file  . Marital Status: Not on file  Intimate Partner Violence:   . Fear of Current or Ex-Partner: Not on file  . Emotionally Abused: Not on file  . Physically Abused: Not on file  . Sexually Abused: Not on file  The patient is divorced.  She lives in [place].  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  PAST FAMILY HISTORY: Family History  Problem Relation Age of Onset  . Stroke Mother   . Alzheimer's disease Mother   . Heart disease Mother   . Emphysema Father   . Hypertension Brother   . Heart attack Maternal Aunt   . Heart failure Maternal Grandmother   . Hypertension Paternal Grandmother     MEDICATIONS  Current Outpatient Medications  Medication Sig Dispense Refill  . aspirin EC 81 MG tablet Take 81 mg by mouth daily.    . diclofenac sodium (VOLTAREN) 1 % GEL Apply 2 g topically 4 (four) times daily.    . hydroxychloroquine (PLAQUENIL) 200 MG tablet TAKE 1 TABLET BY MOUTH TWICE DAILY MONDAY-FRIDAY 120 tablet 0  . lidocaine (XYLOCAINE) 2 % solution Use as directed 10-15 mLs in the mouth or throat every 6 (six) hours as needed for mouth pain. 200 mL 1  . naproxen sodium (ALEVE) 220 MG tablet Take 220 mg by mouth daily  as needed (PAIN).    Marland Kitchen omeprazole (PRILOSEC) 20 MG capsule Take 20 mg by mouth daily.    Marland Kitchen Phenylephrine-DM-GG-APAP (DELSYM COUGH/COLD DAYTIME PO) Take by mouth as needed.     . prochlorperazine (COMPAZINE) 10 MG tablet Take 1 tablet (10 mg total) by mouth every 6 (six) hours as needed for nausea or vomiting. 30 tablet 0  . pseudoephedrine-guaifenesin (MUCINEX D) 60-600 MG 12 hr tablet Take 1 tablet by mouth as needed.     Marland Kitchen VITAMIN D PO Take by mouth.     No current facility-administered medications for this encounter.    ALLERGIES: No Known Allergies  PHYSICAL EXAM:  Unable to assess due to encounter type.   IMPRESSION/PLAN: 1. Progressive MetastaticStage IIIA, cT3N1-2M0 NSCLC, adenocarcinoma of the right lower lobe with brain metastases.  The patient is doing very well in the CNS system, she is radiographically responding well to treatment, and we recommended routine surveillance with 8-monthMRI scans for the remainder of this calendar year, if she continues to show improved response to treatment, we would anticipate extending those intervals to  4 months next year.  She will also continue to follow up with Dr. MJulien Nordmannin observation. 2. Chronic cough. I suggested the patient try OTC allergy medication such as an antihistamine and she will try this. I also suggested she consider seeing pulmonary to make sure her PFTs are up to date. She is in agreement as well. We will coordinate with Kermit Plumonary.   Given current concerns for patient exposure during the COVID-19 pandemic, this encounter was conducted via telephone.  The patient has given verbal consent for this type of encounter. The time spent during this encounter was 25 minutes and 50% of that time was spent in preparation, discussion, and in the coordination of her care. The attendants for this meeting include AShona Simpson PProv[facility] Va Medical Centerand SFarrel Gobble During the encounter, AShona SimpsonPKeokuk County Health Centerwas located remotely at home. SSHWANDA SOLTIS was located at home.    ACarola Rhine PAC

## 2019-06-14 ENCOUNTER — Ambulatory Visit (INDEPENDENT_AMBULATORY_CARE_PROVIDER_SITE_OTHER): Payer: Medicare PPO

## 2019-06-14 ENCOUNTER — Ambulatory Visit: Payer: Self-pay

## 2019-06-14 ENCOUNTER — Other Ambulatory Visit: Payer: Self-pay

## 2019-06-14 ENCOUNTER — Ambulatory Visit: Payer: Medicare Other | Admitting: Rheumatology

## 2019-06-14 ENCOUNTER — Encounter: Payer: Self-pay | Admitting: Rheumatology

## 2019-06-14 VITALS — BP 108/70 | HR 104 | Resp 16 | Ht 60.0 in | Wt 148.6 lb

## 2019-06-14 DIAGNOSIS — M0579 Rheumatoid arthritis with rheumatoid factor of multiple sites without organ or systems involvement: Secondary | ICD-10-CM

## 2019-06-14 DIAGNOSIS — Z79899 Other long term (current) drug therapy: Secondary | ICD-10-CM

## 2019-06-14 DIAGNOSIS — E559 Vitamin D deficiency, unspecified: Secondary | ICD-10-CM

## 2019-06-14 DIAGNOSIS — M5030 Other cervical disc degeneration, unspecified cervical region: Secondary | ICD-10-CM | POA: Diagnosis not present

## 2019-06-14 DIAGNOSIS — C3491 Malignant neoplasm of unspecified part of right bronchus or lung: Secondary | ICD-10-CM

## 2019-06-14 DIAGNOSIS — C3431 Malignant neoplasm of lower lobe, right bronchus or lung: Secondary | ICD-10-CM

## 2019-06-14 DIAGNOSIS — Z8639 Personal history of other endocrine, nutritional and metabolic disease: Secondary | ICD-10-CM

## 2019-06-29 ENCOUNTER — Ambulatory Visit: Payer: Medicare PPO | Admitting: Pulmonary Disease

## 2019-06-29 ENCOUNTER — Encounter: Payer: Self-pay | Admitting: Pulmonary Disease

## 2019-06-29 ENCOUNTER — Other Ambulatory Visit: Payer: Self-pay

## 2019-06-29 VITALS — BP 114/70 | HR 92 | Temp 98.4°F | Ht 60.0 in | Wt 146.0 lb

## 2019-06-29 DIAGNOSIS — C3490 Malignant neoplasm of unspecified part of unspecified bronchus or lung: Secondary | ICD-10-CM | POA: Diagnosis not present

## 2019-06-29 DIAGNOSIS — R05 Cough: Secondary | ICD-10-CM | POA: Diagnosis not present

## 2019-06-29 DIAGNOSIS — R059 Cough, unspecified: Secondary | ICD-10-CM

## 2019-06-29 DIAGNOSIS — J439 Emphysema, unspecified: Secondary | ICD-10-CM | POA: Diagnosis not present

## 2019-06-29 MED ORDER — ANORO ELLIPTA 62.5-25 MCG/INH IN AEPB: 1.0000 | INHALATION_SPRAY | Freq: Every day | RESPIRATORY_TRACT | 0 refills | Status: DC

## 2019-06-29 NOTE — Patient Instructions (Signed)
We will try an inhaler called Anoro Start Flonase Use chlorpheniramine 8 mg 3 times daily.  This medication is available over-the-counter Continue the antiacid medication Follow-up in 6 months.

## 2019-06-29 NOTE — Progress Notes (Signed)
Patient seen in the office today and instructed on use of Anoro.  Patient expressed understanding and demonstrated technique.  Benetta Spar [facility] 06/29/2019

## 2019-06-29 NOTE — Progress Notes (Signed)
Susan Holder    [id number]    Mar 17, 1956  Primary Care Physician:Patient, No Pcp Per  Referring Physician: No referring provider defined for this encounter.  Chief complaint: Follow-up for lung adenocarcinoma, stage IIIa (T3, N1/N2, M88)  HPI: 64 year old ex-smoker with lung adenocarcinoma, rheumatoid arthritis Evaluated at her primary care for recurrent bronchitis with chest x-ray that showed a right hilar opacity.  Subsequent CT scan and PET scan confirmed highly PET avid right hilar mass.  She has been referred to pulmonary for further evaluation  Underwent bronchoscope on 11/09/17 with findings showing adenocarcinoma.  Underwent chemoradiation 8/5 to 01/14/18 and immunotherapy with Durvalumab.  Also underwent radiotherapy to an enlarging right locular lymph node and to the brain for metastasis.  Pets: No pets Occupation: Works as a Aeronautical engineer for [employer] Exposures: No known exposures, no mold, hot tub Smoking history: 20- 40-pack-year smoker.  Quit in July 2019 Travel history: No significant travel history  Interim history: Currently on observation for lung cancer.  Last CT scan shows stable disease with no progression.  His breathing is doing well.  Chief complaint is cough She is taking GERD for acid reflux. Was seen by ENT in October 2019 with chronic laryngitis secondary to GERD, radiation.  Outpatient Encounter Medications as of 06/29/2019  Medication Sig  . aspirin EC 81 MG tablet Take 81 mg by mouth daily.  . diclofenac sodium (VOLTAREN) 1 % GEL Apply 2 g topically 4 (four) times daily.  . hydroxychloroquine (PLAQUENIL) 200 MG tablet TAKE 1 TABLET BY MOUTH TWICE DAILY MONDAY-FRIDAY  . naproxen sodium (ALEVE) 220 MG tablet Take 220 mg by mouth daily as needed (PAIN).  Marland Kitchen omeprazole (PRILOSEC) 20 MG capsule Take 20 mg by mouth daily.  Marland Kitchen Phenylephrine-DM-GG-APAP (DELSYM COUGH/COLD DAYTIME PO) Take by mouth as needed.   . prochlorperazine  (COMPAZINE) 10 MG tablet Take 1 tablet (10 mg total) by mouth every 6 (six) hours as needed for nausea or vomiting.  . pseudoephedrine-guaifenesin (MUCINEX D) 60-600 MG 12 hr tablet Take 1 tablet by mouth as needed.   Marland Kitchen VITAMIN D PO Take by mouth.  . [DISCONTINUED] lidocaine (XYLOCAINE) 2 % solution Use as directed 10-15 mLs in the mouth or throat every 6 (six) hours as needed for mouth pain. (Patient not taking: Reported on 06/14/2019)   No facility-administered encounter medications on file as of 06/29/2019.   Physical Exam: Blood pressure 114/70, pulse 92, temperature 98.4 F (36.9 C), temperature source Temporal, height 5' (1.524 m), weight 146 lb (66.2 kg), last menstrual period 04/28/2000, SpO2 98 %. Gen:      No acute distress HEENT:  EOMI, sclera anicteric Neck:     No masses; no thyromegaly Lungs:    Clear to auscultation bilaterally; normal respiratory effort CV:         Regular rate and rhythm; no murmurs Abd:      + bowel sounds; soft, non-tender; no palpable masses, no distension Ext:    No edema; adequate peripheral perfusion Skin:      Warm and dry; no rash Neuro: alert and oriented x 3 Psych: normal mood and affect  Data Reviewed: Imaging CT chest 09/29/17- 4.6 x 3.9 cm mass at the right hilum as described above likely representing primary pulmonary malignancy. Associated adjacent subcarinal adenopathy. PET CT may be useful for further evaluation.  PET scan 10/06/17- right hilar mass, highly PET avid.  Extends into the subcarinal region.  No other mediastinal,  supraclavicular lymph nodes.   CT chest 04/29/2019-stable post radiation treatment in the right lung with no evidence of recurrent tumor.  4 mm nodule in the left lower lobe.  Emphysema. I have reviewed the images personally.  PFTs 03/07/2018 FVC 1.91 [9 9%), FEV1 1.42 [95%), F/F 74, TLC 65%  Pathology 11/09/17 Non-small cell lung cancer consistent with adenocarcinoma, staining pattern is suggestive of neuroendocrine  differentiation  Assessment:  Lung cancer s/p chemoradiation S/p treatment.  No evidence of disease progression or recurrence on last CT scan  Cough May be secondary to GERD, postnasal drip Continue PPI Start Flonase, chlorphentermine antihistamine  Emphysema No obstruction on PFTs.  Given persistent symptoms of cough, occasional dyspnea will trial Anoro inhaler  Rheumatoid arthritis On Plaquinel. Follows with Dr. Estanislado Pandy.  No evidence of RA- ILD on CT scan  Plan/Recommendations: Continue PPI Flonase, chlorpheniramine Anoro inhaler  Marshell Garfinkel MD [facility] 06/29/2019, 2:29 PM

## 2019-06-29 NOTE — Addendum Note (Signed)
Addended by: Valerie Salts on: 06/29/2019 02:59 PM   Modules accepted: Orders

## 2019-08-04 ENCOUNTER — Other Ambulatory Visit: Payer: Self-pay | Admitting: *Deleted

## 2019-08-04 DIAGNOSIS — C7931 Secondary malignant neoplasm of brain: Secondary | ICD-10-CM

## 2019-08-08 ENCOUNTER — Ambulatory Visit (HOSPITAL_COMMUNITY)
Admission: RE | Admit: 2019-08-08 | Discharge: 2019-08-08 | Disposition: A | Discharge status: Home / Self Care | Payer: Medicare PPO | Source: Ambulatory Visit | Attending: Internal Medicine | Admitting: Internal Medicine

## 2019-08-08 ENCOUNTER — Inpatient Hospital Stay: Payer: Medicare PPO | Attending: Internal Medicine

## 2019-08-08 ENCOUNTER — Encounter (HOSPITAL_COMMUNITY): Payer: Self-pay

## 2019-08-08 ENCOUNTER — Other Ambulatory Visit: Payer: Self-pay

## 2019-08-08 ENCOUNTER — Other Ambulatory Visit: Payer: Self-pay | Admitting: Radiation Therapy

## 2019-08-08 DIAGNOSIS — Z5111 Encounter for antineoplastic chemotherapy: Secondary | ICD-10-CM | POA: Insufficient documentation

## 2019-08-08 DIAGNOSIS — Z5112 Encounter for antineoplastic immunotherapy: Secondary | ICD-10-CM | POA: Insufficient documentation

## 2019-08-08 DIAGNOSIS — Z7982 Long term (current) use of aspirin: Secondary | ICD-10-CM | POA: Diagnosis not present

## 2019-08-08 DIAGNOSIS — C3490 Malignant neoplasm of unspecified part of unspecified bronchus or lung: Secondary | ICD-10-CM

## 2019-08-08 DIAGNOSIS — C7931 Secondary malignant neoplasm of brain: Secondary | ICD-10-CM | POA: Diagnosis not present

## 2019-08-08 DIAGNOSIS — R1310 Dysphagia, unspecified: Secondary | ICD-10-CM | POA: Insufficient documentation

## 2019-08-08 DIAGNOSIS — Z79899 Other long term (current) drug therapy: Secondary | ICD-10-CM | POA: Insufficient documentation

## 2019-08-08 DIAGNOSIS — C3431 Malignant neoplasm of lower lobe, right bronchus or lung: Secondary | ICD-10-CM | POA: Insufficient documentation

## 2019-08-08 DIAGNOSIS — M069 Rheumatoid arthritis, unspecified: Secondary | ICD-10-CM | POA: Diagnosis not present

## 2019-08-08 DIAGNOSIS — Z791 Long term (current) use of non-steroidal anti-inflammatories (NSAID): Secondary | ICD-10-CM | POA: Diagnosis not present

## 2019-08-08 DIAGNOSIS — J439 Emphysema, unspecified: Secondary | ICD-10-CM | POA: Diagnosis not present

## 2019-08-08 LAB — CMP (CANCER CENTER ONLY)
ALT: 13 U/L (ref 0–44)
AST: 22 U/L (ref 15–41)
Albumin: 3.8 g/dL (ref 3.5–5.0)
Alkaline Phosphatase: 103 U/L (ref 38–126)
Anion gap: 9 (ref 5–15)
BUN: 16 mg/dL (ref 8–23)
CO2: 24 mmol/L (ref 22–32)
Calcium: 9.5 mg/dL (ref 8.9–10.3)
Chloride: 105 mmol/L (ref 98–111)
Creatinine: 0.89 mg/dL (ref 0.44–1.00)
GFR, Est AFR Am: >60 mL/min
GFR, Estimated: >60 mL/min
Glucose, Bld: 94 mg/dL (ref 70–99)
Potassium: 4 mmol/L (ref 3.5–5.1)
Sodium: 138 mmol/L (ref 135–145)
Total Bilirubin: 0.2 mg/dL — ABNORMAL LOW (ref 0.3–1.2)
Total Protein: 7.5 g/dL (ref 6.5–8.1)

## 2019-08-08 LAB — CBC WITH DIFFERENTIAL (CANCER CENTER ONLY)
Abs Immature Granulocytes: 0.01 10*3/uL (ref 0.00–0.07)
Basophils Absolute: 0 10*3/uL (ref 0.0–0.1)
Basophils Relative: 1 %
Eosinophils Absolute: 0.1 10*3/uL (ref 0.0–0.5)
Eosinophils Relative: 1 %
HCT: 38 % (ref 36.0–46.0)
Hemoglobin: 12.2 g/dL (ref 12.0–15.0)
Immature Granulocytes: 0 %
Lymphocytes Relative: 25 %
Lymphs Abs: 1.7 10*3/uL (ref 0.7–4.0)
MCH: 31.4 pg (ref 26.0–34.0)
MCHC: 32.1 g/dL (ref 30.0–36.0)
MCV: 97.7 fL (ref 80.0–100.0)
Monocytes Absolute: 0.7 10*3/uL (ref 0.1–1.0)
Monocytes Relative: 11 %
Neutro Abs: 4 10*3/uL (ref 1.7–7.7)
Neutrophils Relative %: 62 %
Platelet Count: 295 10*3/uL (ref 150–400)
RBC: 3.89 MIL/uL (ref 3.87–5.11)
RDW: 13.6 % (ref 11.5–15.5)
WBC Count: 6.5 10*3/uL (ref 4.0–10.5)
nRBC: 0 % (ref 0.0–0.2)

## 2019-08-08 MED ORDER — IOHEXOL 300 MG/ML  SOLN
75.0000 mL | Freq: Once | INTRAMUSCULAR | Status: AC | PRN
  Administered 2019-08-08: 16:00:00 75 mL via INTRAVENOUS

## 2019-08-08 MED ORDER — SODIUM CHLORIDE (PF) 0.9 % IJ SOLN
INTRAMUSCULAR | Status: AC
  Filled 2019-08-08: qty 50

## 2019-08-09 ENCOUNTER — Inpatient Hospital Stay: Payer: Medicare PPO | Admitting: Internal Medicine

## 2019-08-09 ENCOUNTER — Other Ambulatory Visit: Payer: Self-pay

## 2019-08-09 ENCOUNTER — Encounter: Payer: Self-pay | Admitting: Internal Medicine

## 2019-08-09 VITALS — BP 111/71 | HR 100 | Temp 97.8°F | Resp 20 | Ht 60.0 in | Wt 147.2 lb

## 2019-08-09 DIAGNOSIS — C3491 Malignant neoplasm of unspecified part of right bronchus or lung: Secondary | ICD-10-CM | POA: Diagnosis not present

## 2019-08-09 DIAGNOSIS — Z7189 Other specified counseling: Secondary | ICD-10-CM | POA: Diagnosis not present

## 2019-08-09 DIAGNOSIS — Z5112 Encounter for antineoplastic immunotherapy: Secondary | ICD-10-CM

## 2019-08-09 DIAGNOSIS — C3431 Malignant neoplasm of lower lobe, right bronchus or lung: Secondary | ICD-10-CM

## 2019-08-09 DIAGNOSIS — Z5111 Encounter for antineoplastic chemotherapy: Secondary | ICD-10-CM

## 2019-08-09 DIAGNOSIS — C770 Secondary and unspecified malignant neoplasm of lymph nodes of head, face and neck: Secondary | ICD-10-CM

## 2019-08-09 MED ORDER — CYANOCOBALAMIN 1000 MCG/ML IJ SOLN
INTRAMUSCULAR | Status: AC
  Filled 2019-08-09: qty 1

## 2019-08-09 MED ORDER — CYANOCOBALAMIN 1000 MCG/ML IJ SOLN
1000.0000 ug | Freq: Once | INTRAMUSCULAR | Status: AC
  Administered 2019-08-09: 1000 ug via INTRAMUSCULAR

## 2019-08-09 MED ORDER — FOLIC ACID 1 MG PO TABS: 1.0000 mg | ORAL_TABLET | Freq: Every day | ORAL | 4 refills | Status: DC

## 2019-08-09 NOTE — Progress Notes (Signed)
[facility] [phone]   [phone]  OFFICE PROGRESS NOTE  Patient, No Pcp Per No address on file  DIAGNOSIS: Metastatic non-small cell lung cancer initially diagnosed as stage IIIA (T3, N1/N2, M0) non-small cell lung cancer, adenocarcinoma presented with large right lower lobe lung mass with extension to the right hilum and subcarinal area diagnosed in July 2019.  She has brain metastasis in October 2020.  Biomarker Findings Tumor Mutational Burden - TMB-Intermediate (6 Muts/Mb) Microsatellite status - MS-Stable Genomic Findings For a complete list of the genes assayed, please refer to the Appendix. NRAS Q61R ARAF amplification STK11 G56W KRAS G13D MYCN amplification MCL1 amplification NKX2-1 amplification - equivocal TP53 G245V 7 Disease relevant genes with no reportable alterations: EGFR, ALK, BRAF, MET, ERBB2, RET, ROS1   PRIOR THERAPY:  1) Course of concurrent chemoradiation with weekly carboplatin for AUC of 2 and paclitaxel 45 mg/M2.  Status post 7 cycles.  Last dose was giving 01/11/2018. 2) Consolidation treatment with immunotherapy with Imfinzi (Durvalumab) 10 mg/KG every 2 weeks.  First dose February 09, 2018.  Status post 19 cycles. 3) status post stereotactic body radiotherapy to the enlarging right supraclavicular lymphadenopathy under the care of Dr. Lisbeth Renshaw. 4) SRS to multiple brain metastasis under the care of Dr. Lisbeth Renshaw.  CURRENT THERAPY: Systemic chemotherapy with carboplatin for AUC of 5, Alimta 500 mg/M2 and Keytruda 200 mg IV every 3 weeks.  First dose 08/16/2019.   INTERVAL HISTORY: Susan Holder 64 y.o. female returns to the clinic today for follow-up visit.  The patient is feeling fine today with no concerning complaints.  She gained several pounds since her last visit.  She denied having any current chest pain, shortness of breath, cough or hemoptysis.  She denied having any fever or chills.  She has no nausea,  vomiting, diarrhea or constipation.  She has no headache or visual changes.  She had repeat CT scan of the neck and chest performed recently and she is here for evaluation and discussion of her risk her results and recommendation regarding treatment of her condition.   MEDICAL HISTORY: Past Medical History:  Diagnosis Date   met lung ca dx'd 09/2017   neck LN and brain 2020   Rheumatoid arthritis ([place])     ALLERGIES:  has No Known Allergies.  MEDICATIONS:  Current Outpatient Medications  Medication Sig Dispense Refill   aspirin EC 81 MG tablet Take 81 mg by mouth daily.     diclofenac sodium (VOLTAREN) 1 % GEL Apply 2 g topically 4 (four) times daily.     hydroxychloroquine (PLAQUENIL) 200 MG tablet TAKE 1 TABLET BY MOUTH TWICE DAILY MONDAY-FRIDAY 120 tablet 0   naproxen sodium (ALEVE) 220 MG tablet Take 220 mg by mouth daily as needed (PAIN).     omeprazole (PRILOSEC) 20 MG capsule Take 20 mg by mouth daily.     Phenylephrine-DM-GG-APAP (DELSYM COUGH/COLD DAYTIME PO) Take by mouth as needed.      prochlorperazine (COMPAZINE) 10 MG tablet Take 1 tablet (10 mg total) by mouth every 6 (six) hours as needed for nausea or vomiting. 30 tablet 0   pseudoephedrine-guaifenesin (MUCINEX D) 60-600 MG 12 hr tablet Take 1 tablet by mouth as needed.      umeclidinium-vilanterol (ANORO ELLIPTA) 62.5-25 MCG/INH AEPB Inhale 1 puff into the lungs daily. 1 each 0   VITAMIN D PO Take by mouth.     No current facility-administered medications for this visit.    SURGICAL  HISTORY:  Past Surgical History:  Procedure Laterality Date   BACK SURGERY     BRONCHIAL NEEDLE ASPIRATION BIOPSY  11/09/2017   Procedure: BRONCHIAL NEEDLE ASPIRATION BIOPSIES;  Surgeon: Marshell Garfinkel, MD;  Location: [facility];  Service: Cardiopulmonary;;   ENDOBRONCHIAL ULTRASOUND Bilateral 11/09/2017   Procedure: ENDOBRONCHIAL ULTRASOUND;  Surgeon: Marshell Garfinkel, MD;  Location: [facility];  Service:  Cardiopulmonary;  Laterality: Bilateral;   NASAL SINUS SURGERY     NASAL SINUS SURGERY     TUBAL LIGATION     VIDEO BRONCHOSCOPY  11/09/2017   Procedure: VIDEO BRONCHOSCOPY;  Surgeon: Marshell Garfinkel, MD;  Location: [facility];  Service: Cardiopulmonary;;    REVIEW OF SYSTEMS:  Constitutional: positive for fatigue Eyes: negative Ears, nose, mouth, throat, and face: negative Respiratory: negative Cardiovascular: negative Gastrointestinal: negative Genitourinary:negative Integument/breast: negative Hematologic/lymphatic: negative Musculoskeletal:negative Neurological: negative Behavioral/Psych: negative Endocrine: negative Allergic/Immunologic: negative   PHYSICAL EXAMINATION: General appearance: alert, cooperative and no distress Head: Normocephalic, without obvious abnormality, atraumatic Neck: no adenopathy, no JVD, supple, symmetrical, trachea midline and thyroid not enlarged, symmetric, no tenderness/mass/nodules Lymph nodes: Cervical, supraclavicular, and axillary nodes normal. Resp: clear to auscultation bilaterally Back: symmetric, no curvature. ROM normal. No CVA tenderness. Cardio: regular rate and rhythm, S1, S2 normal, no murmur, click, rub or gallop GI: soft, non-tender; bowel sounds normal; no masses,  no organomegaly Extremities: extremities normal, atraumatic, no cyanosis or edema Neurologic: Alert and oriented X 3, normal strength and tone. Normal symmetric reflexes. Normal coordination and gait  ECOG PERFORMANCE STATUS: 1 - Symptomatic but completely ambulatory  Blood pressure 111/71, pulse 100, temperature 97.8 F (36.6 C), temperature source Temporal, resp. rate 20, height 5' (1.524 m), weight 147 lb 3.2 oz (66.8 kg), last menstrual period 04/28/2000, SpO2 98 %.  LABORATORY DATA: Lab Results  Component Value Date   WBC 6.5 08/08/2019   HGB 12.2 08/08/2019   HCT 38.0 08/08/2019   MCV 97.7 08/08/2019   PLT 295 08/08/2019      Chemistry        Component Value Date/Time   NA 138 08/08/2019 1316   K 4.0 08/08/2019 1316   CL 105 08/08/2019 1316   CO2 24 08/08/2019 1316   BUN 16 08/08/2019 1316   CREATININE 0.89 08/08/2019 1316   CREATININE 0.80 07/24/2017 1438      Component Value Date/Time   CALCIUM 9.5 08/08/2019 1316   ALKPHOS 103 08/08/2019 1316   AST 22 08/08/2019 1316   ALT 13 08/08/2019 1316   BILITOT 0.2 (L) 08/08/2019 1316       RADIOGRAPHIC STUDIES: CT Soft Tissue Neck W Contrast  Result Date: 08/08/2019 CLINICAL DATA:  Non-small cell lung carcinoma staging. History of right-sided cervical lymphadenopathy. EXAM: CT NECK WITH CONTRAST TECHNIQUE: Multidetector CT imaging of the neck was performed using the standard protocol following the bolus administration of intravenous contrast. CONTRAST:  46m OMNIPAQUE IOHEXOL 300 MG/ML  SOLN COMPARISON:  CT neck 05/09/2019 FINDINGS: Pharynx and larynx: Normal. No mass or swelling. Salivary glands: No inflammation, mass, or stone. Thyroid: Normal. Lymph nodes: Slightly decreased size of right supraclavicular nodal conglomerate measuring 1.7 x 1.4 cm, previously 2.1 x 1.4 cm (series 2, image 74, 75). Vascular: Negative. Limited intracranial: Limited visualization of previously described cerebellar lesions. Visualized orbits: Negative Mastoids and visualized paranasal sinuses: Clear Skeleton: Negative Upper chest: Please refer to dedicated report for CT of the chest. Other: None IMPRESSION: 1. Slightly decreased size of right supraclavicular nodal conglomerate. 2. No new abnormality of the neck. Electronically  Signed   By: Ulyses Jarred M.D.   On: 08/08/2019 22:00   CT Chest W Contrast  Result Date: 08/08/2019 CLINICAL DATA:  Non-small cell lung cancer, staging. Radiation therapy and chemotherapy complete. EXAM: CT CHEST WITH CONTRAST TECHNIQUE: Multidetector CT imaging of the chest was performed during intravenous contrast administration. CONTRAST:  45m OMNIPAQUE IOHEXOL 300 MG/ML   SOLN COMPARISON:  05/09/2019. FINDINGS: Cardiovascular: Atherosclerotic calcification of the aorta and coronary arteries. Heart size normal. No pericardial effusion. Mediastinum/Nodes: No pathologically enlarged mediastinal, hilar or axillary lymph nodes. Esophagus is mildly dilated and contains an air-fluid level. Lungs/Pleura: Centrilobular emphysema. Scarring at the apex of the right lung. Parenchymal retraction and bronchiectasis are seen in the right perihilar region and right lower lobe, similar to the prior exam. 8 mm anterior lingular nodule (7/63), previously 2 mm. 7 mm anterior left lower lobe nodule (7/73), previously 3 mm. 4 mm left lower lobe nodule (7/61), stable. Previously measured 4 mm nodule in the anterior left lower lobe is no longer identified. No pleural fluid. Airway is otherwise unremarkable. Upper Abdomen: Visualized portions of the liver gallbladder, adrenal glands, kidneys, spleen, pancreas, stomach and bowel are grossly unremarkable. Musculoskeletal: Degenerative changes in the spine. No worrisome lytic or sclerotic lesions. IMPRESSION: 1. Enlarging nodules in the lingula and left lower lobe are most indicative of metastatic disease. 2. Additional 4 mm nodule in the left lower lobe is stable. 3. Post treatment parenchymal retraction and scarring in the right lung. 4. Aortic atherosclerosis (ICD10-I70.0). Coronary artery calcification. 5.  Emphysema (ICD10-J43.9). Electronically Signed   By: MLorin PicketM.D.   On: 08/08/2019 16:34    ASSESSMENT AND PLAN: This is a very pleasant 64years old African-American female with metastatic non-small cell lung cancer initially diagnosed with a stage IIIA non-small cell lung cancer, adenocarcinoma.  She underwent a course of concurrent chemoradiation with weekly carboplatin and paclitaxel status post 7 cycles with partial response.   The patient tolerated this course of treatment well except for mild odynophagia and dysphagia. She completed  on consolidation treatment with immunotherapy with Imfinzi (Durvalumab) status post 18 cycles. She also completed SBRT to the right supraclavicular lymphadenopathy. The patient had evidence for multiple brain metastasis in October 2020 and she underwent SRS treatment to this lesion under the care of Dr. MLisbeth Renshaw The patient has been in observation since that time and she is feeling fine. She had repeat CT scan of the neck and the chest performed recently.  I personally and independently reviewed the scan images and discussed the results with the patient today. Her scan of the neck showed no concerning findings for progression and there was some mild decrease in the right supraclavicular nodal area.  The scan of the chest showed some evidence for disease progression in multiple pulmonary nodules. I discussed with the patient her treatment options including continuous observation and monitoring versus proceeding with systemic chemotherapy with carboplatin for AUC of 5, Alimta 500 mg/M2 and Keytruda 200 mg IV every 3 weeks as first-line treatment for the metastatic disease at this point.  The patient is interested in proceeding with the treatment. I discussed with her the adverse effect of this treatment including but not limited to alopecia, myelosuppression, nausea and vomiting, peripheral neuropathy, liver or renal dysfunction as well as immunotherapy adverse effects. She is expected to start the first cycle of this treatment next week. She will receive vitamin B12 injection today. I will call her pharmacy with prescription for folic acid 1 mg p.o.  daily. The patient will come back for follow-up visit in 2 weeks for evaluation and management of any adverse effect of her treatment. She was advised to call immediately if she has any other concerning symptoms in the interval. The patient voices understanding of current disease status and treatment options and is in agreement with the current care plan. All  questions were answered. The patient knows to call the clinic with any problems, questions or concerns. We can certainly see the patient much sooner if necessary.  Disclaimer: This note was dictated with voice recognition software. Similar sounding words can inadvertently be transcribed and may not be corrected upon review.

## 2019-08-09 NOTE — Progress Notes (Signed)
DISCONTINUE ON PATHWAY REGIMEN - Non-Small Cell Lung     A cycle is every 14 days:     Durvalumab   **Always confirm dose/schedule in your pharmacy ordering system**  REASON: Disease Progression PRIOR TREATMENT: [id number]: Durvalumab 10 mg/kg q14 Days x up to 12 Months TREATMENT RESPONSE: Progressive Disease (PD)  START ON PATHWAY REGIMEN - Non-Small Cell Lung     A cycle is every 21 days:     Pembrolizumab      Pemetrexed      Carboplatin   **Always confirm dose/schedule in your pharmacy ordering system**  Patient Characteristics: Stage IV Metastatic, Nonsquamous, Initial Chemotherapy/Immunotherapy, PS = 0, 1, ALK Rearrangement Negative and ROS1 Rearrangement Negative and NTRK Gene Fusion?Negative and RET Gene Fusion?Negative and EGFR Mutation Negative/Non?Sensitizing, PD-L1  Expression Positive 1-49% (TPS) / Negative / Not Tested / Awaiting Test Results and Immunotherapy Candidate Therapeutic Status: Stage IV Metastatic Histology: Nonsquamous Cell ROS1 Rearrangement Status: Negative Other Mutations/Biomarkers: No Other Actionable Mutations NTRK Gene Fusion Status: Negative PD-L1 Expression Status: Did Not Order Test Chemotherapy/Immunotherapy LOT: Initial Chemotherapy/Immunotherapy Molecular Targeted Therapy: Not Appropriate MET Exon 14 Mutation Status: Negative RET Gene Fusion Status: Negative ALK Rearrangement Status: Negative EGFR Mutation Status: Negative/Wild Type BRAF [id number] Mutation Status: Negative ECOG Performance Status: 1 Biomarker Assessment Status Confirmation: All Genomic Markers Negative or Only MET+ or BRAF+ Immunotherapy Candidate Status: Candidate for Immunotherapy Intent of Therapy: Non-Curative / Palliative Intent, Discussed with Patient

## 2019-08-11 ENCOUNTER — Telehealth: Payer: Self-pay | Admitting: Internal Medicine

## 2019-08-11 NOTE — Progress Notes (Signed)
Pharmacist Chemotherapy Monitoring - Initial Assessment    Anticipated start date: 08/17/19   Regimen:  . Are orders appropriate based on the patient's diagnosis, regimen, and cycle? Yes . Does the plan date match the patient's scheduled date? Yes . Is the sequencing of drugs appropriate? Yes . Are the premedications appropriate for the patient's regimen? Yes . Prior Authorization for treatment is: Pending o If applicable, is the correct biosimilar selected based on the patient's insurance? not applicable  Organ Function and Labs: Marland Kitchen Are dose adjustments needed based on the patient's renal function, hepatic function, or hematologic function? Yes . Are appropriate labs ordered prior to the start of patient's treatment? Yes . Other organ system assessment, if indicated: N/A . The following baseline labs, if indicated, have been ordered: pembrolizumab: baseline TSH +/- T4  Dose Assessment: . Are the drug doses appropriate? Yes . Are the following correct: o Drug concentrations Yes o IV fluid compatible with drug Yes o Administration routes Yes o Timing of therapy Yes . If applicable, does the patient have documented access for treatment and/or plans for port-a-cath placement? yes . If applicable, have lifetime cumulative doses been properly documented and assessed? not applicable Lifetime Dose Tracking  . Carboplatin: 1,260 mg = 0.01 % of the maximum lifetime dose of 999,999,999 mg  o   Toxicity Monitoring/Prevention: . The patient has the following take home antiemetics prescribed: Prochlorperazine . The patient has the following take home medications prescribed: B12 for pemetrexed and folic acid for pemetrexed . Medication allergies and previous infusion related reactions, if applicable, have been reviewed and addressed. Yes . The patient's current medication list has been assessed for drug-drug interactions with their chemotherapy regimen. no significant drug-drug interactions were  identified on review.  Order Review: . Are the treatment plan orders signed? Yes . Is the patient scheduled to see a provider prior to their treatment? No  I verify that I have reviewed each item in the above checklist and answered each question accordingly.  Sherran Margolis D 08/11/2019 2:40 PM

## 2019-08-11 NOTE — Telephone Encounter (Signed)
Scheduled per los. Called and spoke with patient. Confirmed appt 

## 2019-08-17 ENCOUNTER — Inpatient Hospital Stay: Payer: Medicare PPO

## 2019-08-17 ENCOUNTER — Other Ambulatory Visit: Payer: Self-pay

## 2019-08-17 VITALS — BP 107/60 | HR 106 | Temp 98.9°F | Resp 18

## 2019-08-17 DIAGNOSIS — C3491 Malignant neoplasm of unspecified part of right bronchus or lung: Secondary | ICD-10-CM

## 2019-08-17 DIAGNOSIS — C3431 Malignant neoplasm of lower lobe, right bronchus or lung: Secondary | ICD-10-CM

## 2019-08-17 DIAGNOSIS — Z5112 Encounter for antineoplastic immunotherapy: Secondary | ICD-10-CM | POA: Diagnosis not present

## 2019-08-17 LAB — CBC WITH DIFFERENTIAL (CANCER CENTER ONLY)
Abs Immature Granulocytes: 0.02 10*3/uL (ref 0.00–0.07)
Basophils Absolute: 0 10*3/uL (ref 0.0–0.1)
Basophils Relative: 1 %
Eosinophils Absolute: 0 10*3/uL (ref 0.0–0.5)
Eosinophils Relative: 1 %
HCT: 38.5 % (ref 36.0–46.0)
Hemoglobin: 12.2 g/dL (ref 12.0–15.0)
Immature Granulocytes: 0 %
Lymphocytes Relative: 22 %
Lymphs Abs: 1.3 10*3/uL (ref 0.7–4.0)
MCH: 30.8 pg (ref 26.0–34.0)
MCHC: 31.7 g/dL (ref 30.0–36.0)
MCV: 97.2 fL (ref 80.0–100.0)
Monocytes Absolute: 0.7 10*3/uL (ref 0.1–1.0)
Monocytes Relative: 11 %
Neutro Abs: 3.7 10*3/uL (ref 1.7–7.7)
Neutrophils Relative %: 65 %
Platelet Count: 290 10*3/uL (ref 150–400)
RBC: 3.96 MIL/uL (ref 3.87–5.11)
RDW: 13.7 % (ref 11.5–15.5)
WBC Count: 5.7 10*3/uL (ref 4.0–10.5)
nRBC: 0 % (ref 0.0–0.2)

## 2019-08-17 LAB — CMP (CANCER CENTER ONLY)
ALT: 13 U/L (ref 0–44)
AST: 21 U/L (ref 15–41)
Albumin: 3.6 g/dL (ref 3.5–5.0)
Alkaline Phosphatase: 100 U/L (ref 38–126)
Anion gap: 13 (ref 5–15)
BUN: 13 mg/dL (ref 8–23)
CO2: 23 mmol/L (ref 22–32)
Calcium: 9.2 mg/dL (ref 8.9–10.3)
Chloride: 109 mmol/L (ref 98–111)
Creatinine: 0.92 mg/dL (ref 0.44–1.00)
GFR, Est AFR Am: >60 mL/min (ref >60)
GFR, Estimated: >60 mL/min (ref >60)
Glucose, Bld: 104 mg/dL — ABNORMAL HIGH (ref 70–99)
Potassium: 3.8 mmol/L (ref 3.5–5.1)
Sodium: 145 mmol/L (ref 135–145)
Total Bilirubin: 0.2 mg/dL — ABNORMAL LOW (ref 0.3–1.2)
Total Protein: 7.3 g/dL (ref 6.5–8.1)

## 2019-08-17 LAB — TSH: TSH: 1.655 u[IU]/mL (ref 0.308–3.960)

## 2019-08-17 MED ORDER — PALONOSETRON HCL INJECTION 0.25 MG/5ML
INTRAVENOUS | Status: AC
  Filled 2019-08-17: qty 5

## 2019-08-17 MED ORDER — SODIUM CHLORIDE 0.9 % IV SOLN
Freq: Once | INTRAVENOUS | Status: AC
  Filled 2019-08-17: qty 250

## 2019-08-17 MED ORDER — SODIUM CHLORIDE 0.9 % IV SOLN
420.5000 mg | Freq: Once | INTRAVENOUS | Status: AC
  Administered 2019-08-17: 14:00:00 420 mg via INTRAVENOUS
  Filled 2019-08-17: qty 42

## 2019-08-17 MED ORDER — SODIUM CHLORIDE 0.9 % IV SOLN
150.0000 mg | Freq: Once | INTRAVENOUS | Status: AC
  Administered 2019-08-17: 150 mg via INTRAVENOUS
  Filled 2019-08-17: qty 150

## 2019-08-17 MED ORDER — PALONOSETRON HCL INJECTION 0.25 MG/5ML
0.2500 mg | Freq: Once | INTRAVENOUS | Status: AC
  Administered 2019-08-17: 0.25 mg via INTRAVENOUS

## 2019-08-17 MED ORDER — SODIUM CHLORIDE 0.9 % IV SOLN
500.0000 mg/m2 | Freq: Once | INTRAVENOUS | Status: AC
  Administered 2019-08-17: 13:00:00 800 mg via INTRAVENOUS
  Filled 2019-08-17: qty 20

## 2019-08-17 MED ORDER — SODIUM CHLORIDE 0.9 % IV SOLN
10.0000 mg | Freq: Once | INTRAVENOUS | Status: AC
  Administered 2019-08-17: 10 mg via INTRAVENOUS
  Filled 2019-08-17: qty 10

## 2019-08-17 MED ORDER — SODIUM CHLORIDE 0.9 % IV SOLN
200.0000 mg | Freq: Once | INTRAVENOUS | Status: AC
  Administered 2019-08-17: 200 mg via INTRAVENOUS
  Filled 2019-08-17: qty 8

## 2019-08-17 NOTE — Progress Notes (Signed)
Okay to treat with Pulse 106 per Dr. Julien Nordmann

## 2019-08-17 NOTE — Patient Instructions (Signed)
[facility] Discharge Instructions for Patients Receiving Chemotherapy  Today you received the following chemotherapy agents: Keytruda, Alimta, Carboplatin  To help prevent nausea and vomiting after your treatment, we encourage you to take your nausea medication as directed.   If you develop nausea and vomiting that is not controlled by your nausea medication, call the clinic.   BELOW ARE SYMPTOMS THAT SHOULD BE REPORTED IMMEDIATELY:  *FEVER GREATER THAN 100.5 F  *CHILLS WITH OR WITHOUT FEVER  NAUSEA AND VOMITING THAT IS NOT CONTROLLED WITH YOUR NAUSEA MEDICATION  *UNUSUAL SHORTNESS OF BREATH  *UNUSUAL BRUISING OR BLEEDING  TENDERNESS IN MOUTH AND THROAT WITH OR WITHOUT PRESENCE OF ULCERS  *URINARY PROBLEMS  *BOWEL PROBLEMS  UNUSUAL RASH Items with * indicate a potential emergency and should be followed up as soon as possible.  Feel free to call the clinic should you have any questions or concerns. The clinic phone number is [phone].  Please show the [facility] at check-in to the Emergency Department and triage nurse.  Pembrolizumab injection What is this medicine? PEMBROLIZUMAB (pem broe liz ue mab) is a monoclonal antibody. It is used to treat certain types of cancer. This medicine may be used for other purposes; ask your health care provider or pharmacist if you have questions. COMMON BRAND NAME(S): Keytruda What should I tell my health care provider before I take this medicine? They need to know if you have any of these conditions:  diabetes  immune system problems  inflammatory bowel disease  liver disease  lung or breathing disease  lupus  received or scheduled to receive an organ transplant or a stem-cell transplant that uses donor stem cells  an unusual or allergic reaction to pembrolizumab, other medicines, foods, dyes, or preservatives  pregnant or trying to get pregnant  breast-feeding How should I use this  medicine? This medicine is for infusion into a vein. It is given by a health care professional in a hospital or clinic setting. A special MedGuide will be given to you before each treatment. Be sure to read this information carefully each time. Talk to your pediatrician regarding the use of this medicine in children. While this drug may be prescribed for children as young as 6 months for selected conditions, precautions do apply. Overdosage: If you think you have taken too much of this medicine contact a poison control center or emergency room at once. NOTE: This medicine is only for you. Do not share this medicine with others. What if I miss a dose? It is important not to miss your dose. Call your doctor or health care professional if you are unable to keep an appointment. What may interact with this medicine? Interactions have not been studied. Give your health care provider a list of all the medicines, herbs, non-prescription drugs, or dietary supplements you use. Also tell them if you smoke, drink alcohol, or use illegal drugs. Some items may interact with your medicine. This list may not describe all possible interactions. Give your health care provider a list of all the medicines, herbs, non-prescription drugs, or dietary supplements you use. Also tell them if you smoke, drink alcohol, or use illegal drugs. Some items may interact with your medicine. What should I watch for while using this medicine? Your condition will be monitored carefully while you are receiving this medicine. You may need blood work done while you are taking this medicine. Do not become pregnant while taking this medicine or for 4 months after stopping it.  Women should inform their doctor if they wish to become pregnant or think they might be pregnant. There is a potential for serious side effects to an unborn child. Talk to your health care professional or pharmacist for more information. Do not breast-feed an infant while  taking this medicine or for 4 months after the last dose. What side effects may I notice from receiving this medicine? Side effects that you should report to your doctor or health care professional as soon as possible:  allergic reactions like skin rash, itching or hives, swelling of the face, lips, or tongue  bloody or black, tarry  breathing problems  changes in vision  chest pain  chills  confusion  constipation  cough  diarrhea  dizziness or feeling faint or lightheaded  fast or irregular heartbeat  fever  flushing  joint pain  low blood counts - this medicine may decrease the number of white blood cells, red blood cells and platelets. You may be at increased risk for infections and bleeding.  muscle pain  muscle weakness  pain, tingling, numbness in the hands or feet  persistent headache  redness, blistering, peeling or loosening of the skin, including inside the mouth  signs and symptoms of high blood sugar such as dizziness; dry mouth; dry skin; fruity breath; nausea; stomach pain; increased hunger or thirst; increased urination  signs and symptoms of kidney injury like trouble passing urine or change in the amount of urine  signs and symptoms of liver injury like dark urine, light-colored stools, loss of appetite, nausea, right upper belly pain, yellowing of the eyes or skin  sweating  swollen lymph nodes  weight loss Side effects that usually do not require medical attention (report to your doctor or health care professional if they continue or are bothersome):  decreased appetite  hair loss  muscle pain  tiredness This list may not describe all possible side effects. Call your doctor for medical advice about side effects. You may report side effects to FDA at 1-800-FDA-1088. Where should I keep my medicine? This drug is given in a hospital or clinic and will not be stored at home. NOTE: This sheet is a summary. It may not cover all  possible information. If you have questions about this medicine, talk to your doctor, pharmacist, or health care provider.  2020 Elsevier/Gold Standard (2019-02-18 18:07:58)  Pemetrexed injection What is this medicine? PEMETREXED (PEM e TREX ed) is a chemotherapy drug used to treat lung cancers like non-small cell lung cancer and mesothelioma. It may also be used to treat other cancers. This medicine may be used for other purposes; ask your health care provider or pharmacist if you have questions. COMMON BRAND NAME(S): Alimta What should I tell my health care provider before I take this medicine? They need to know if you have any of these conditions:  infection (especially a virus infection such as chickenpox, cold sores, or herpes)  kidney disease  low blood counts, like low white cell, platelet, or red cell counts  lung or breathing disease, like asthma  radiation therapy  an unusual or allergic reaction to pemetrexed, other medicines, foods, dyes, or preservative  pregnant or trying to get pregnant  breast-feeding How should I use this medicine? This drug is given as an infusion into a vein. It is administered in a hospital or clinic by a specially trained health care professional. Talk to your pediatrician regarding the use of this medicine in children. Special care may be needed. Overdosage: If  you think you have taken too much of this medicine contact a poison control center or emergency room at once. NOTE: This medicine is only for you. Do not share this medicine with others. What if I miss a dose? It is important not to miss your dose. Call your doctor or health care professional if you are unable to keep an appointment. What may interact with this medicine? This medicine may interact with the following medications:  Ibuprofen This list may not describe all possible interactions. Give your health care provider a list of all the medicines, herbs, non-prescription drugs,  or dietary supplements you use. Also tell them if you smoke, drink alcohol, or use illegal drugs. Some items may interact with your medicine. What should I watch for while using this medicine? Visit your doctor for checks on your progress. This drug may make you feel generally unwell. This is not uncommon, as chemotherapy can affect healthy cells as well as cancer cells. Report any side effects. Continue your course of treatment even though you feel ill unless your doctor tells you to stop. In some cases, you may be given additional medicines to help with side effects. Follow all directions for their use. Call your doctor or health care professional for advice if you get a fever, chills or sore throat, or other symptoms of a cold or flu. Do not treat yourself. This drug decreases your body's ability to fight infections. Try to avoid being around people who are sick. This medicine may increase your risk to bruise or bleed. Call your doctor or health care professional if you notice any unusual bleeding. Be careful brushing and flossing your teeth or using a toothpick because you may get an infection or bleed more easily. If you have any dental work done, tell your dentist you are receiving this medicine. Avoid taking products that contain aspirin, acetaminophen, ibuprofen, naproxen, or ketoprofen unless instructed by your doctor. These medicines may hide a fever. Call your doctor or health care professional if you get diarrhea or mouth sores. Do not treat yourself. To protect your kidneys, drink water or other fluids as directed while you are taking this medicine. Do not become pregnant while taking this medicine or for 6 months after stopping it. Women should inform their doctor if they wish to become pregnant or think they might be pregnant. Men should not father a child while taking this medicine and for 3 months after stopping it. This may interfere with the ability to father a child. You should talk to  your doctor or health care professional if you are concerned about your fertility. There is a potential for serious side effects to an unborn child. Talk to your health care professional or pharmacist for more information. Do not breast-feed an infant while taking this medicine or for 1 week after stopping it. What side effects may I notice from receiving this medicine? Side effects that you should report to your doctor or health care professional as soon as possible:  allergic reactions like skin rash, itching or hives, swelling of the face, lips, or tongue  breathing problems  redness, blistering, peeling or loosening of the skin, including inside the mouth  signs and symptoms of bleeding such as bloody or black, tarry stools; red or dark-brown urine; spitting up blood or brown material that looks like coffee grounds; red spots on the skin; unusual bruising or bleeding from the eye, gums, or nose  signs and symptoms of infection like fever or chills; cough;  sore throat; pain or trouble passing urine  signs and symptoms of kidney injury like trouble passing urine or change in the amount of urine  signs and symptoms of liver injury like dark yellow or brown urine; general ill feeling or flu-like symptoms; light-colored stools; loss of appetite; nausea; right upper belly pain; unusually weak or tired; yellowing of the eyes or skin Side effects that usually do not require medical attention (report to your doctor or health care professional if they continue or are bothersome):  constipation  mouth sores  nausea, vomiting  unusually weak or tired This list may not describe all possible side effects. Call your doctor for medical advice about side effects. You may report side effects to FDA at 1-800-FDA-1088. Where should I keep my medicine? This drug is given in a hospital or clinic and will not be stored at home. NOTE: This sheet is a summary. It may not cover all possible information. If  you have questions about this medicine, talk to your doctor, pharmacist, or health care provider.  2020 Elsevier/Gold Standard (2017-06-03 16:11:33)  Carboplatin injection What is this medicine? CARBOPLATIN (KAR boe pla tin) is a chemotherapy drug. It targets fast dividing cells, like cancer cells, and causes these cells to die. This medicine is used to treat ovarian cancer and many other cancers. This medicine may be used for other purposes; ask your health care provider or pharmacist if you have questions. COMMON BRAND NAME(S): Paraplatin What should I tell my health care provider before I take this medicine? They need to know if you have any of these conditions:  blood disorders  hearing problems  kidney disease  recent or ongoing radiation therapy  an unusual or allergic reaction to carboplatin, cisplatin, other chemotherapy, other medicines, foods, dyes, or preservatives  pregnant or trying to get pregnant  breast-feeding How should I use this medicine? This drug is usually given as an infusion into a vein. It is administered in a hospital or clinic by a specially trained health care professional. Talk to your pediatrician regarding the use of this medicine in children. Special care may be needed. Overdosage: If you think you have taken too much of this medicine contact a poison control center or emergency room at once. NOTE: This medicine is only for you. Do not share this medicine with others. What if I miss a dose? It is important not to miss a dose. Call your doctor or health care professional if you are unable to keep an appointment. What may interact with this medicine?  medicines for seizures  medicines to increase blood counts like filgrastim, pegfilgrastim, sargramostim  some antibiotics like amikacin, gentamicin, neomycin, streptomycin, tobramycin  vaccines Talk to your doctor or health care professional before taking any of these  medicines:  acetaminophen  aspirin  ibuprofen  ketoprofen  naproxen This list may not describe all possible interactions. Give your health care provider a list of all the medicines, herbs, non-prescription drugs, or dietary supplements you use. Also tell them if you smoke, drink alcohol, or use illegal drugs. Some items may interact with your medicine. What should I watch for while using this medicine? Your condition will be monitored carefully while you are receiving this medicine. You will need important blood work done while you are taking this medicine. This drug may make you feel generally unwell. This is not uncommon, as chemotherapy can affect healthy cells as well as cancer cells. Report any side effects. Continue your course of treatment even though you  feel ill unless your doctor tells you to stop. In some cases, you may be given additional medicines to help with side effects. Follow all directions for their use. Call your doctor or health care professional for advice if you get a fever, chills or sore throat, or other symptoms of a cold or flu. Do not treat yourself. This drug decreases your body's ability to fight infections. Try to avoid being around people who are sick. This medicine may increase your risk to bruise or bleed. Call your doctor or health care professional if you notice any unusual bleeding. Be careful brushing and flossing your teeth or using a toothpick because you may get an infection or bleed more easily. If you have any dental work done, tell your dentist you are receiving this medicine. Avoid taking products that contain aspirin, acetaminophen, ibuprofen, naproxen, or ketoprofen unless instructed by your doctor. These medicines may hide a fever. Do not become pregnant while taking this medicine. Women should inform their doctor if they wish to become pregnant or think they might be pregnant. There is a potential for serious side effects to an unborn child. Talk  to your health care professional or pharmacist for more information. Do not breast-feed an infant while taking this medicine. What side effects may I notice from receiving this medicine? Side effects that you should report to your doctor or health care professional as soon as possible:  allergic reactions like skin rash, itching or hives, swelling of the face, lips, or tongue  signs of infection - fever or chills, cough, sore throat, pain or difficulty passing urine  signs of decreased platelets or bleeding - bruising, pinpoint red spots on the skin, black, tarry stools, nosebleeds  signs of decreased red blood cells - unusually weak or tired, fainting spells, lightheadedness  breathing problems  changes in hearing  changes in vision  chest pain  high blood pressure  low blood counts - This drug may decrease the number of white blood cells, red blood cells and platelets. You may be at increased risk for infections and bleeding.  nausea and vomiting  pain, swelling, redness or irritation at the injection site  pain, tingling, numbness in the hands or feet  problems with balance, talking, walking  trouble passing urine or change in the amount of urine Side effects that usually do not require medical attention (report to your doctor or health care professional if they continue or are bothersome):  hair loss  loss of appetite  metallic taste in the mouth or changes in taste This list may not describe all possible side effects. Call your doctor for medical advice about side effects. You may report side effects to FDA at 1-800-FDA-1088. Where should I keep my medicine? This drug is given in a hospital or clinic and will not be stored at home. NOTE: This sheet is a summary. It may not cover all possible information. If you have questions about this medicine, talk to your doctor, pharmacist, or health care provider.  2020 Elsevier/Gold Standard (2007-07-20 14:38:05)

## 2019-08-18 ENCOUNTER — Encounter: Payer: Self-pay | Admitting: General Practice

## 2019-08-18 ENCOUNTER — Telehealth: Payer: Self-pay | Admitting: *Deleted

## 2019-08-18 NOTE — Progress Notes (Signed)
[facility] Spiritual Care Note  Referred by Casandra Doffing for spiritual, emotional, and informational support regarding Ms Alarid's question about how to talk to her 66yo granddaughter Vita about her diagnosis and treatment. Ms Burkman and I spoke for 25 minutes about communication strategies, [employer], "The Invisible String" by Valda Lamb, and other sources of grief in her life (namely the loss of her mother ca five years ago from Alzheimers complications). Provided empathic listening, normalization of feelings, emotional support, and encouragement to reach out anytime for further Spiritual Care for herself or her family.   [place], [place], [facility] Pager [phone] Voicemail [phone]

## 2019-08-23 ENCOUNTER — Inpatient Hospital Stay: Payer: Medicare PPO | Admitting: Internal Medicine

## 2019-08-23 ENCOUNTER — Inpatient Hospital Stay: Payer: Medicare PPO | Admitting: Physician Assistant

## 2019-08-23 ENCOUNTER — Inpatient Hospital Stay: Payer: Medicare PPO

## 2019-08-23 ENCOUNTER — Encounter: Payer: Self-pay | Admitting: Internal Medicine

## 2019-08-23 ENCOUNTER — Other Ambulatory Visit: Payer: Self-pay

## 2019-08-23 VITALS — BP 107/70 | HR 107 | Temp 98.2°F | Resp 18 | Ht 60.0 in | Wt 146.9 lb

## 2019-08-23 DIAGNOSIS — Z5111 Encounter for antineoplastic chemotherapy: Secondary | ICD-10-CM | POA: Diagnosis not present

## 2019-08-23 DIAGNOSIS — Z5112 Encounter for antineoplastic immunotherapy: Secondary | ICD-10-CM

## 2019-08-23 DIAGNOSIS — C3491 Malignant neoplasm of unspecified part of right bronchus or lung: Secondary | ICD-10-CM

## 2019-08-23 DIAGNOSIS — C7931 Secondary malignant neoplasm of brain: Secondary | ICD-10-CM | POA: Diagnosis not present

## 2019-08-23 DIAGNOSIS — C3431 Malignant neoplasm of lower lobe, right bronchus or lung: Secondary | ICD-10-CM | POA: Diagnosis not present

## 2019-08-23 LAB — CMP (CANCER CENTER ONLY)
ALT: 25 U/L (ref 0–44)
AST: 28 U/L (ref 15–41)
Albumin: 3.6 g/dL (ref 3.5–5.0)
Alkaline Phosphatase: 108 U/L (ref 38–126)
Anion gap: 9 (ref 5–15)
BUN: 10 mg/dL (ref 8–23)
CO2: 24 mmol/L (ref 22–32)
Calcium: 9.6 mg/dL (ref 8.9–10.3)
Chloride: 106 mmol/L (ref 98–111)
Creatinine: 0.84 mg/dL (ref 0.44–1.00)
GFR, Est AFR Am: >60 mL/min (ref >60)
GFR, Estimated: >60 mL/min (ref >60)
Glucose, Bld: 113 mg/dL — ABNORMAL HIGH (ref 70–99)
Potassium: 3.9 mmol/L (ref 3.5–5.1)
Sodium: 139 mmol/L (ref 135–145)
Total Bilirubin: 0.4 mg/dL (ref 0.3–1.2)
Total Protein: 7.3 g/dL (ref 6.5–8.1)

## 2019-08-23 LAB — CBC WITH DIFFERENTIAL (CANCER CENTER ONLY)
Abs Immature Granulocytes: 0.01 10*3/uL (ref 0.00–0.07)
Basophils Absolute: 0 10*3/uL (ref 0.0–0.1)
Basophils Relative: 0 %
Eosinophils Absolute: 0 10*3/uL (ref 0.0–0.5)
Eosinophils Relative: 1 %
HCT: 38.5 % (ref 36.0–46.0)
Hemoglobin: 12.7 g/dL (ref 12.0–15.0)
Immature Granulocytes: 0 %
Lymphocytes Relative: 24 %
Lymphs Abs: 1 10*3/uL (ref 0.7–4.0)
MCH: 31.8 pg (ref 26.0–34.0)
MCHC: 33 g/dL (ref 30.0–36.0)
MCV: 96.5 fL (ref 80.0–100.0)
Monocytes Absolute: 0.1 10*3/uL (ref 0.1–1.0)
Monocytes Relative: 3 %
Neutro Abs: 2.9 10*3/uL (ref 1.7–7.7)
Neutrophils Relative %: 72 %
Platelet Count: 248 10*3/uL (ref 150–400)
RBC: 3.99 MIL/uL (ref 3.87–5.11)
RDW: 13.2 % (ref 11.5–15.5)
WBC Count: 4 10*3/uL (ref 4.0–10.5)
nRBC: 0 % (ref 0.0–0.2)

## 2019-08-23 IMAGING — CT NUCLEAR MEDICINE PET IMAGE RESTAGING (PS) SKULL BASE TO THIGH
1 of 8 series · 3 of 16 positions shown, 4 images · non-contrast
Comparison: PET-CT 10/06/2017.  Chest CT 10/15/2018.

CLINICAL DATA: Subsequent treatment strategy for right-sided lung
cancer diagnosed in 0763 post chemotherapy and radiation therapy.
Right superior mediastinal adenopathy on recent CT.

EXAM:
NUCLEAR MEDICINE PET SKULL BASE TO THIGH
TECHNIQUE: 6.8 mCi F-18 FDG was injected intravenously. Full-ring PET imaging
was performed from the skull base to thigh after the radiotracer. CT
data was obtained and used for attenuation correction and anatomic
localization.
Fasting blood glucose: 79 mg/dl

[Series 4: ct sk_thigh 5.0 b31f · axial · 0.98mm/px · z∈[-1266,-406]mm · 3 of 216 slices shown, 4 images]
[im 1/216  soft-tissue]
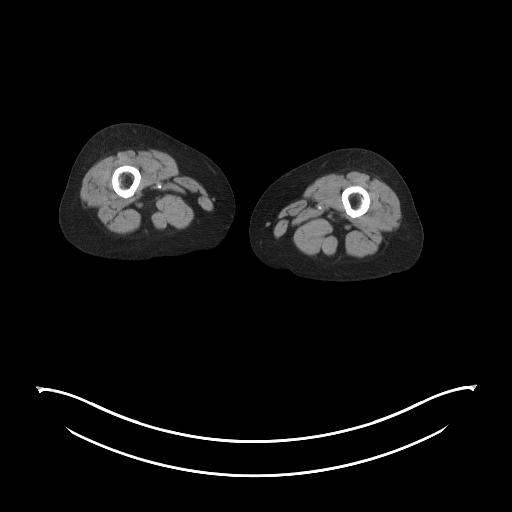
[im 1/216  bone]
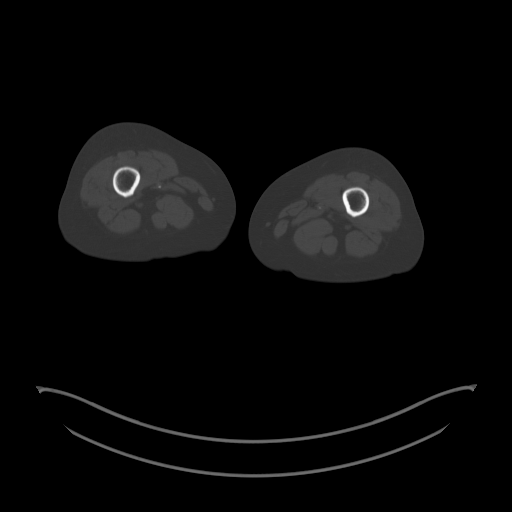
[im 108/216  soft-tissue]
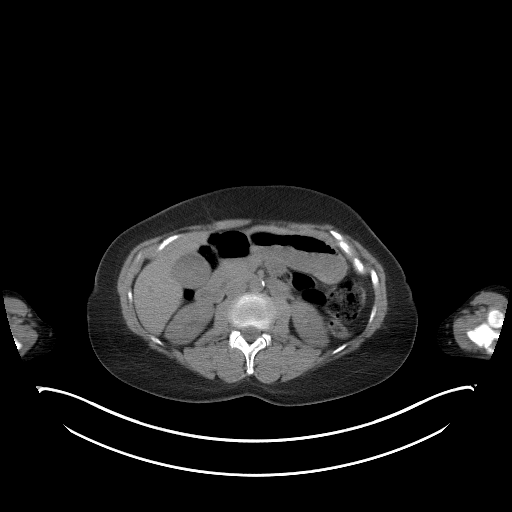
[im 216/216  soft-tissue]
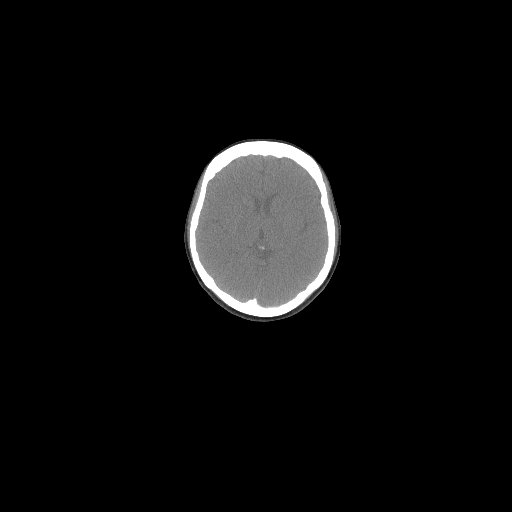

[3 of 16 positions shown; findings below may reference images not displayed]

FINDINGS: Mediastinal blood pool activity: SUV max

Liver activity: SUV max NA

NECK:

There is a large hypermetabolic nodal mass at the thoracic inlet on
the right, measuring 3.2 x 3.2 cm on image 44/4. This has an SUV max
of 12.1 and is new from the previous PET-CT. There are no other
hypermetabolic cervical lymph nodes.There are no lesions of the
pharyngeal mucosal space.

Incidental CT findings: none

CHEST:

The large right supraclavicular node described above extends into
the superior mediastinum at the level of the thyroid gland. No other
hypermetabolic mediastinal or hilar lymph nodes are demonstrated.
There is mild hypermetabolic activity associated with small axillary
lymph nodes bilaterally (SUV max of 5.4 on the left). There are
stable radiation changes in the right perihilar region without
associated hypermetabolic activity. No suspicious pulmonary
activity.

Incidental CT findings: Emphysema, post treatment changes and
scattered pulmonary ground-glass opacities are stable from the
recent CT.

ABDOMEN/PELVIS:

There is no hypermetabolic activity within the liver, adrenal
glands, spleen or pancreas. There is no suspicious hypermetabolic
nodal activity. There are small inguinal lymph nodes bilaterally
which demonstrate low-level hypermetabolic activity (up to SUV of
3.1 on the left).

Incidental CT findings: Moderate diffuse aortic and branch vessel
atherosclerosis. Surgical clips in the pelvis.

SKELETON:

There is no hypermetabolic activity to suggest osseous metastatic
disease. There is scattered arthropathic activity at the shoulders,
elbows and wrists bilaterally.

Incidental CT findings: none
IMPRESSION: 1. The large right supraclavicular lymph node seen on recent chest
CT is hypermetabolic, consistent with a nodal metastasis. This
should be amenable to percutaneous biopsy if clinically warranted.
2. No other definite evidence of metastatic disease. Small axillary
and inguinal lymph nodes bilaterally are mildly hypermetabolic, but
likely reactive.
3. No residual activity in the right perihilar region to suggest
local recurrence. Underlying radiation changes in this area are
stable.

## 2019-08-23 NOTE — Progress Notes (Signed)
[place] [phone]) [phone]   Fax:(336) [phone]  OFFICE PROGRESS NOTE  Patient, No Pcp Per No address on file  DIAGNOSIS: Metastatic non-small cell lung cancer initially diagnosed as stage IIIA (T3, N1/N2, M0) non-small cell lung cancer, adenocarcinoma presented with large right lower lobe lung mass with extension to the right hilum and subcarinal area diagnosed in July 2019.  She has brain metastasis in October 2020.  Biomarker Findings Tumor Mutational Burden - TMB-Intermediate (6 Muts/Mb) Microsatellite status - MS-Stable Genomic Findings For a complete list of the genes assayed, please refer to the Appendix. NRAS Q61R ARAF amplification STK11 G56W KRAS G13D MYCN amplification MCL1 amplification NKX2-1 amplification - equivocal? TP53 G245V 7 Disease relevant genes with no reportable alterations: EGFR, ALK, BRAF, MET, ERBB2, RET, ROS1   PRIOR THERAPY:  1) Course of concurrent chemoradiation with weekly carboplatin for AUC of 2 and paclitaxel 45 mg/M2.  Status post 7 cycles.  Last dose was giving 01/11/2018. 2) Consolidation treatment with immunotherapy with Imfinzi (Durvalumab) 10 mg/KG every 2 weeks.  First dose February 09, 2018.  Status post 19 cycles. 3) status post stereotactic body radiotherapy to the enlarging right supraclavicular lymphadenopathy under the care of Dr. Lisbeth Renshaw. 4) SRS to multiple brain metastasis under the care of Dr. Lisbeth Renshaw.  CURRENT THERAPY: Systemic chemotherapy with carboplatin for AUC of 5, Alimta 500 mg/M2 and Keytruda 200 mg IV every 3 weeks.  First dose 08/16/2019.  Status post 1 cycle.   INTERVAL HISTORY: Susan Holder 64 y.o. female returns to the clinic today for follow-up visit.  The patient is feeling fine today with no concerning complaints except for mild fatigue and some queasiness in her stomach.  She denied having any chest pain, shortness of breath, cough or hemoptysis.  She has no nausea, vomiting, diarrhea  or constipation.  She denied having any headache or visual changes.  She denied having any recent weight loss or night sweats.  She tolerated the first week of her systemic chemotherapy fairly well.  The patient is here today for evaluation and repeat blood work.   MEDICAL HISTORY: Past Medical History:  Diagnosis Date  . met lung ca dx'd 09/2017   neck LN and brain 2020  . Rheumatoid arthritis ([place])     ALLERGIES:  has No Known Allergies.  MEDICATIONS:  Current Outpatient Medications  Medication Sig Dispense Refill  . aspirin EC 81 MG tablet Take 81 mg by mouth daily.    . diclofenac sodium (VOLTAREN) 1 % GEL Apply 2 g topically 4 (four) times daily.    . folic acid (FOLVITE) 1 MG tablet Take 1 tablet (1 mg total) by mouth daily. 30 tablet 4  . hydroxychloroquine (PLAQUENIL) 200 MG tablet TAKE 1 TABLET BY MOUTH TWICE DAILY MONDAY-FRIDAY 120 tablet 0  . naproxen sodium (ALEVE) 220 MG tablet Take 220 mg by mouth daily as needed (PAIN).    Marland Kitchen omeprazole (PRILOSEC) 20 MG capsule Take 20 mg by mouth daily.    Marland Kitchen Phenylephrine-DM-GG-APAP (DELSYM COUGH/COLD DAYTIME PO) Take by mouth as needed.     . prochlorperazine (COMPAZINE) 10 MG tablet Take 1 tablet (10 mg total) by mouth every 6 (six) hours as needed for nausea or vomiting. 30 tablet 0  . pseudoephedrine-guaifenesin (MUCINEX D) 60-600 MG 12 hr tablet Take 1 tablet by mouth as needed.     . umeclidinium-vilanterol (ANORO ELLIPTA) 62.5-25 MCG/INH AEPB Inhale 1 puff into the lungs daily. 1 each 0  . VITAMIN  D PO Take by mouth.     No current facility-administered medications for this visit.    SURGICAL HISTORY:  Past Surgical History:  Procedure Laterality Date  . BACK SURGERY    . BRONCHIAL NEEDLE ASPIRATION BIOPSY  11/09/2017   Procedure: BRONCHIAL NEEDLE ASPIRATION BIOPSIES;  Surgeon: Marshell Garfinkel, MD;  Location: [facility];  Service: Cardiopulmonary;;  . ENDOBRONCHIAL ULTRASOUND Bilateral 11/09/2017   Procedure: ENDOBRONCHIAL  ULTRASOUND;  Surgeon: Marshell Garfinkel, MD;  Location: [facility];  Service: Cardiopulmonary;  Laterality: Bilateral;  . NASAL SINUS SURGERY    . NASAL SINUS SURGERY    . TUBAL LIGATION    . VIDEO BRONCHOSCOPY  11/09/2017   Procedure: VIDEO BRONCHOSCOPY;  Surgeon: Marshell Garfinkel, MD;  Location: [facility];  Service: Cardiopulmonary;;    REVIEW OF SYSTEMS:  A comprehensive review of systems was negative except for: Constitutional: positive for fatigue   PHYSICAL EXAMINATION: General appearance: alert, cooperative and no distress Head: Normocephalic, without obvious abnormality, atraumatic Neck: no adenopathy, no JVD, supple, symmetrical, trachea midline and thyroid not enlarged, symmetric, no tenderness/mass/nodules Lymph nodes: Cervical, supraclavicular, and axillary nodes normal. Resp: clear to auscultation bilaterally Back: symmetric, no curvature. ROM normal. No CVA tenderness. Cardio: regular rate and rhythm, S1, S2 normal, no murmur, click, rub or gallop GI: soft, non-tender; bowel sounds normal; no masses,  no organomegaly Extremities: extremities normal, atraumatic, no cyanosis or edema  ECOG PERFORMANCE STATUS: 1 - Symptomatic but completely ambulatory  Blood pressure 107/70, pulse (!) 107, temperature 98.2 F (36.8 C), temperature source Temporal, resp. rate 18, height 5' (1.524 m), weight 146 lb 14.4 oz (66.6 kg), last menstrual period 04/28/2000, SpO2 100 %.  LABORATORY DATA: Lab Results  Component Value Date   WBC 4.0 08/23/2019   HGB 12.7 08/23/2019   HCT 38.5 08/23/2019   MCV 96.5 08/23/2019   PLT 248 08/23/2019      Chemistry      Component Value Date/Time   NA 145 08/17/2019 1039   K 3.8 08/17/2019 1039   CL 109 08/17/2019 1039   CO2 23 08/17/2019 1039   BUN 13 08/17/2019 1039   CREATININE 0.92 08/17/2019 1039   CREATININE 0.80 07/24/2017 1438      Component Value Date/Time   CALCIUM 9.2 08/17/2019 1039   ALKPHOS 100 08/17/2019 1039   AST 21  08/17/2019 1039   ALT 13 08/17/2019 1039   BILITOT 0.2 (L) 08/17/2019 1039       RADIOGRAPHIC STUDIES: CT Soft Tissue Neck W Contrast  Result Date: 08/08/2019 CLINICAL DATA:  Non-small cell lung carcinoma staging. History of right-sided cervical lymphadenopathy. EXAM: CT NECK WITH CONTRAST TECHNIQUE: Multidetector CT imaging of the neck was performed using the standard protocol following the bolus administration of intravenous contrast. CONTRAST:  62m OMNIPAQUE IOHEXOL 300 MG/ML  SOLN COMPARISON:  CT neck 05/09/2019 FINDINGS: Pharynx and larynx: Normal. No mass or swelling. Salivary glands: No inflammation, mass, or stone. Thyroid: Normal. Lymph nodes: Slightly decreased size of right supraclavicular nodal conglomerate measuring 1.7 x 1.4 cm, previously 2.1 x 1.4 cm (series 2, image 74, 75). Vascular: Negative. Limited intracranial: Limited visualization of previously described cerebellar lesions. Visualized orbits: Negative Mastoids and visualized paranasal sinuses: Clear Skeleton: Negative Upper chest: Please refer to dedicated report for CT of the chest. Other: None IMPRESSION: 1. Slightly decreased size of right supraclavicular nodal conglomerate. 2. No new abnormality of the neck. Electronically Signed   By: KUlyses JarredM.D.   On: 08/08/2019 22:00   CT Chest  W Contrast  Result Date: 08/08/2019 CLINICAL DATA:  Non-small cell lung cancer, staging. Radiation therapy and chemotherapy complete. EXAM: CT CHEST WITH CONTRAST TECHNIQUE: Multidetector CT imaging of the chest was performed during intravenous contrast administration. CONTRAST:  20m OMNIPAQUE IOHEXOL 300 MG/ML  SOLN COMPARISON:  05/09/2019. FINDINGS: Cardiovascular: Atherosclerotic calcification of the aorta and coronary arteries. Heart size normal. No pericardial effusion. Mediastinum/Nodes: No pathologically enlarged mediastinal, hilar or axillary lymph nodes. Esophagus is mildly dilated and contains an air-fluid level. Lungs/Pleura:  Centrilobular emphysema. Scarring at the apex of the right lung. Parenchymal retraction and bronchiectasis are seen in the right perihilar region and right lower lobe, similar to the prior exam. 8 mm anterior lingular nodule (7/63), previously 2 mm. 7 mm anterior left lower lobe nodule (7/73), previously 3 mm. 4 mm left lower lobe nodule (7/61), stable. Previously measured 4 mm nodule in the anterior left lower lobe is no longer identified. No pleural fluid. Airway is otherwise unremarkable. Upper Abdomen: Visualized portions of the liver gallbladder, adrenal glands, kidneys, spleen, pancreas, stomach and bowel are grossly unremarkable. Musculoskeletal: Degenerative changes in the spine. No worrisome lytic or sclerotic lesions. IMPRESSION: 1. Enlarging nodules in the lingula and left lower lobe are most indicative of metastatic disease. 2. Additional 4 mm nodule in the left lower lobe is stable. 3. Post treatment parenchymal retraction and scarring in the right lung. 4. Aortic atherosclerosis (ICD10-I70.0). Coronary artery calcification. 5.  Emphysema (ICD10-J43.9). Electronically Signed   By: MLorin PicketM.D.   On: 08/08/2019 16:34    ASSESSMENT AND PLAN: This is a very pleasant 64years old African-American female with metastatic non-small cell lung cancer initially diagnosed with a stage IIIA non-small cell lung cancer, adenocarcinoma.  She underwent a course of concurrent chemoradiation with weekly carboplatin and paclitaxel status post 7 cycles with partial response.   The patient tolerated this course of treatment well except for mild odynophagia and dysphagia. She completed on consolidation treatment with immunotherapy with Imfinzi (Durvalumab) status post 18 cycles. She also completed SBRT to the right supraclavicular lymphadenopathy. The patient had evidence for multiple brain metastasis in October 2020 and she underwent SRS treatment to this lesion under the care of Dr. MLisbeth Renshaw The patient had  evidence for disease progression and she started systemic chemotherapy with carboplatin, Alimta and Keytruda status post 1 cycle. She tolerated the first week of her treatment well with no concerning complaints. I recommended for the patient to continue her current treatment as planned and she is expected to start cycle #2 in 2 weeks.  She will come back for follow-up visit at that time. The patient was advised to call immediately if she has any concerning symptoms in the interval. The patient voices understanding of current disease status and treatment options and is in agreement with the current care plan. All questions were answered. The patient knows to call the clinic with any problems, questions or concerns. We can certainly see the patient much sooner if necessary.  Disclaimer: This note was dictated with voice recognition software. Similar sounding words can inadvertently be transcribed and may not be corrected upon review.

## 2019-08-30 ENCOUNTER — Inpatient Hospital Stay: Payer: Medicare PPO | Attending: Internal Medicine

## 2019-08-30 ENCOUNTER — Other Ambulatory Visit: Payer: Self-pay

## 2019-08-30 ENCOUNTER — Other Ambulatory Visit: Payer: Self-pay | Admitting: Lab

## 2019-08-30 DIAGNOSIS — C770 Secondary and unspecified malignant neoplasm of lymph nodes of head, face and neck: Secondary | ICD-10-CM | POA: Insufficient documentation

## 2019-08-30 DIAGNOSIS — C3491 Malignant neoplasm of unspecified part of right bronchus or lung: Secondary | ICD-10-CM

## 2019-08-30 DIAGNOSIS — C3431 Malignant neoplasm of lower lobe, right bronchus or lung: Secondary | ICD-10-CM | POA: Insufficient documentation

## 2019-08-30 DIAGNOSIS — Z5112 Encounter for antineoplastic immunotherapy: Secondary | ICD-10-CM | POA: Diagnosis present

## 2019-08-30 DIAGNOSIS — M069 Rheumatoid arthritis, unspecified: Secondary | ICD-10-CM | POA: Insufficient documentation

## 2019-08-30 DIAGNOSIS — Z79899 Other long term (current) drug therapy: Secondary | ICD-10-CM | POA: Insufficient documentation

## 2019-08-30 DIAGNOSIS — Z7982 Long term (current) use of aspirin: Secondary | ICD-10-CM | POA: Insufficient documentation

## 2019-08-30 DIAGNOSIS — Z5111 Encounter for antineoplastic chemotherapy: Secondary | ICD-10-CM | POA: Diagnosis present

## 2019-08-30 DIAGNOSIS — C7931 Secondary malignant neoplasm of brain: Secondary | ICD-10-CM | POA: Diagnosis not present

## 2019-08-30 LAB — CBC WITH DIFFERENTIAL (CANCER CENTER ONLY)
Abs Immature Granulocytes: 0.01 10*3/uL (ref 0.00–0.07)
Basophils Absolute: 0 10*3/uL (ref 0.0–0.1)
Basophils Relative: 0 %
Eosinophils Absolute: 0 10*3/uL (ref 0.0–0.5)
Eosinophils Relative: 1 %
HCT: 36 % (ref 36.0–46.0)
Hemoglobin: 11.6 g/dL — ABNORMAL LOW (ref 12.0–15.0)
Immature Granulocytes: 0 %
Lymphocytes Relative: 22 %
Lymphs Abs: 1 10*3/uL (ref 0.7–4.0)
MCH: 31.2 pg (ref 26.0–34.0)
MCHC: 32.2 g/dL (ref 30.0–36.0)
MCV: 96.8 fL (ref 80.0–100.0)
Monocytes Absolute: 0.8 10*3/uL (ref 0.1–1.0)
Monocytes Relative: 17 %
Neutro Abs: 2.7 10*3/uL (ref 1.7–7.7)
Neutrophils Relative %: 60 %
Platelet Count: 233 10*3/uL (ref 150–400)
RBC: 3.72 MIL/uL — ABNORMAL LOW (ref 3.87–5.11)
RDW: 13.4 % (ref 11.5–15.5)
WBC Count: 4.5 10*3/uL (ref 4.0–10.5)
nRBC: 0 % (ref 0.0–0.2)

## 2019-08-30 LAB — CMP (CANCER CENTER ONLY)
ALT: 35 U/L (ref 0–44)
AST: 33 U/L (ref 15–41)
Albumin: 3.4 g/dL — ABNORMAL LOW (ref 3.5–5.0)
Alkaline Phosphatase: 103 U/L (ref 38–126)
Anion gap: 6 (ref 5–15)
BUN: 13 mg/dL (ref 8–23)
CO2: 26 mmol/L (ref 22–32)
Calcium: 9.3 mg/dL (ref 8.9–10.3)
Chloride: 106 mmol/L (ref 98–111)
Creatinine: 0.83 mg/dL (ref 0.44–1.00)
GFR, Est AFR Am: >60 mL/min (ref >60)
GFR, Estimated: >60 mL/min (ref >60)
Glucose, Bld: 90 mg/dL (ref 70–99)
Potassium: 4 mmol/L (ref 3.5–5.1)
Sodium: 138 mmol/L (ref 135–145)
Total Bilirubin: 0.2 mg/dL — ABNORMAL LOW (ref 0.3–1.2)
Total Protein: 6.9 g/dL (ref 6.5–8.1)

## 2019-08-31 NOTE — Progress Notes (Signed)
Pharmacist Chemotherapy Monitoring - Follow Up Assessment    I verify that I have reviewed each item in the below checklist:  . Regimen for the patient is scheduled for the appropriate day and plan matches scheduled date. Marland Kitchen Appropriate non-routine labs are ordered dependent on drug ordered. . If applicable, additional medications reviewed and ordered per protocol based on lifetime cumulative doses and/or treatment regimen.   Plan for follow-up and/or issues identified: No . I-vent associated with next due treatment: No . MD and/or nursing notified: No  Susan Holder [facility] 08/31/2019 8:40 AM

## 2019-09-01 ENCOUNTER — Other Ambulatory Visit: Payer: Self-pay | Admitting: Rheumatology

## 2019-09-01 DIAGNOSIS — M0579 Rheumatoid arthritis with rheumatoid factor of multiple sites without organ or systems involvement: Secondary | ICD-10-CM

## 2019-09-01 NOTE — Telephone Encounter (Signed)
Last Visit: 06/14/2019 Next Visit: 11/10/2019 Labs: 08/30/2019 Albumin 3.4, Total bilirubin 0.2, RBC 3.72, Hgb 11.6 PLQ eye exam: 02/03/2019 WNL   Current Dose per office note on 06/14/2019: Plaquenil 200 mg 1 tablet twice daily Monday through Friday only  Okay to refill per Dr. Estanislado Pandy

## 2019-09-06 ENCOUNTER — Inpatient Hospital Stay: Payer: Medicare PPO

## 2019-09-06 ENCOUNTER — Other Ambulatory Visit: Payer: Self-pay

## 2019-09-06 ENCOUNTER — Inpatient Hospital Stay: Payer: Medicare PPO | Admitting: Internal Medicine

## 2019-09-06 ENCOUNTER — Other Ambulatory Visit: Payer: Self-pay | Admitting: Internal Medicine

## 2019-09-06 ENCOUNTER — Encounter: Payer: Self-pay | Admitting: Internal Medicine

## 2019-09-06 VITALS — HR 109

## 2019-09-06 VITALS — BP 135/76 | HR 108 | Temp 98.3°F | Resp 20 | Ht 60.0 in | Wt 148.4 lb

## 2019-09-06 DIAGNOSIS — C3431 Malignant neoplasm of lower lobe, right bronchus or lung: Secondary | ICD-10-CM | POA: Diagnosis not present

## 2019-09-06 DIAGNOSIS — C3491 Malignant neoplasm of unspecified part of right bronchus or lung: Secondary | ICD-10-CM

## 2019-09-06 DIAGNOSIS — Z5111 Encounter for antineoplastic chemotherapy: Secondary | ICD-10-CM

## 2019-09-06 DIAGNOSIS — Z5112 Encounter for antineoplastic immunotherapy: Secondary | ICD-10-CM

## 2019-09-06 LAB — CMP (CANCER CENTER ONLY)
ALT: 18 U/L (ref 0–44)
AST: 23 U/L (ref 15–41)
Albumin: 3.6 g/dL (ref 3.5–5.0)
Alkaline Phosphatase: 111 U/L (ref 38–126)
Anion gap: 10 (ref 5–15)
BUN: 9 mg/dL (ref 8–23)
CO2: 21 mmol/L — ABNORMAL LOW (ref 22–32)
Calcium: 9.3 mg/dL (ref 8.9–10.3)
Chloride: 108 mmol/L (ref 98–111)
Creatinine: 0.82 mg/dL (ref 0.44–1.00)
GFR, Est AFR Am: >60 mL/min (ref >60)
GFR, Estimated: >60 mL/min (ref >60)
Glucose, Bld: 98 mg/dL (ref 70–99)
Potassium: 3.8 mmol/L (ref 3.5–5.1)
Sodium: 139 mmol/L (ref 135–145)
Total Bilirubin: 0.2 mg/dL — ABNORMAL LOW (ref 0.3–1.2)
Total Protein: 7.4 g/dL (ref 6.5–8.1)

## 2019-09-06 LAB — TSH: TSH: 1.49 u[IU]/mL (ref 0.308–3.960)

## 2019-09-06 LAB — CBC WITH DIFFERENTIAL (CANCER CENTER ONLY)
Abs Immature Granulocytes: 0.01 10*3/uL (ref 0.00–0.07)
Basophils Absolute: 0 10*3/uL (ref 0.0–0.1)
Basophils Relative: 1 %
Eosinophils Absolute: 0 10*3/uL (ref 0.0–0.5)
Eosinophils Relative: 1 %
HCT: 35.6 % — ABNORMAL LOW (ref 36.0–46.0)
Hemoglobin: 11.6 g/dL — ABNORMAL LOW (ref 12.0–15.0)
Immature Granulocytes: 0 %
Lymphocytes Relative: 30 %
Lymphs Abs: 1.3 10*3/uL (ref 0.7–4.0)
MCH: 31.4 pg (ref 26.0–34.0)
MCHC: 32.6 g/dL (ref 30.0–36.0)
MCV: 96.2 fL (ref 80.0–100.0)
Monocytes Absolute: 0.8 10*3/uL (ref 0.1–1.0)
Monocytes Relative: 18 %
Neutro Abs: 2.2 10*3/uL (ref 1.7–7.7)
Neutrophils Relative %: 50 %
Platelet Count: 308 10*3/uL (ref 150–400)
RBC: 3.7 MIL/uL — ABNORMAL LOW (ref 3.87–5.11)
RDW: 13.7 % (ref 11.5–15.5)
WBC Count: 4.3 10*3/uL (ref 4.0–10.5)
nRBC: 0 % (ref 0.0–0.2)

## 2019-09-06 MED ORDER — PALONOSETRON HCL INJECTION 0.25 MG/5ML
INTRAVENOUS | Status: AC
  Filled 2019-09-06: qty 5

## 2019-09-06 MED ORDER — SODIUM CHLORIDE 0.9 % IV SOLN
500.0000 mg/m2 | Freq: Once | INTRAVENOUS | Status: AC
  Administered 2019-09-06: 800 mg via INTRAVENOUS
  Filled 2019-09-06: qty 20

## 2019-09-06 MED ORDER — SODIUM CHLORIDE 0.9 % IV SOLN
200.0000 mg | Freq: Once | INTRAVENOUS | Status: AC
  Administered 2019-09-06: 200 mg via INTRAVENOUS
  Filled 2019-09-06: qty 8

## 2019-09-06 MED ORDER — SODIUM CHLORIDE 0.9 % IV SOLN
150.0000 mg | Freq: Once | INTRAVENOUS | Status: AC
  Administered 2019-09-06: 150 mg via INTRAVENOUS
  Filled 2019-09-06: qty 150

## 2019-09-06 MED ORDER — SODIUM CHLORIDE 0.9 % IV SOLN
10.0000 mg | Freq: Once | INTRAVENOUS | Status: AC
  Administered 2019-09-06: 10 mg via INTRAVENOUS
  Filled 2019-09-06: qty 10

## 2019-09-06 MED ORDER — SODIUM CHLORIDE 0.9 % IV SOLN
420.5000 mg | Freq: Once | INTRAVENOUS | Status: AC
  Administered 2019-09-06: 420 mg via INTRAVENOUS
  Filled 2019-09-06: qty 42

## 2019-09-06 MED ORDER — SODIUM CHLORIDE 0.9 % IV SOLN
Freq: Once | INTRAVENOUS | Status: AC
  Filled 2019-09-06: qty 250

## 2019-09-06 MED ORDER — PALONOSETRON HCL INJECTION 0.25 MG/5ML
0.2500 mg | Freq: Once | INTRAVENOUS | Status: AC
  Administered 2019-09-06: 0.25 mg via INTRAVENOUS

## 2019-09-06 NOTE — Progress Notes (Signed)
Pt's HR is 108-110, ok to treat per Dr. Julien Nordmann.

## 2019-09-06 NOTE — Progress Notes (Signed)
[place] Telephone:(336) [phone]   Fax:(336) [phone]  OFFICE PROGRESS NOTE  Patient, No Pcp Per No address on file  DIAGNOSIS: Metastatic non-small cell lung cancer initially diagnosed as stage IIIA (T3, N1/N2, M0) non-small cell lung cancer, adenocarcinoma presented with large right lower lobe lung mass with extension to the right hilum and subcarinal area diagnosed in July 2019.  She has brain metastasis in October 2020.  Biomarker Findings Tumor Mutational Burden - TMB-Intermediate (6 Muts/Mb) Microsatellite status - MS-Stable Genomic Findings For a complete list of the genes assayed, please refer to the Appendix. NRAS Q61R ARAF amplification STK11 G56W KRAS G13D MYCN amplification MCL1 amplification NKX2-1 amplification - equivocal? TP53 G245V 7 Disease relevant genes with no reportable alterations: EGFR, ALK, BRAF, MET, ERBB2, RET, ROS1   PRIOR THERAPY:  1) Course of concurrent chemoradiation with weekly carboplatin for AUC of 2 and paclitaxel 45 mg/M2.  Status post 7 cycles.  Last dose was giving 01/11/2018. 2) Consolidation treatment with immunotherapy with Imfinzi (Durvalumab) 10 mg/KG every 2 weeks.  First dose February 09, 2018.  Status post 19 cycles. 3) status post stereotactic body radiotherapy to the enlarging right supraclavicular lymphadenopathy under the care of Dr. Lisbeth Renshaw. 4) SRS to multiple brain metastasis under the care of Dr. Lisbeth Renshaw.  CURRENT THERAPY: Systemic chemotherapy with carboplatin for AUC of 5, Alimta 500 mg/M2 and Keytruda 200 mg IV every 3 weeks.  First dose 08/16/2019.  Status post 1 cycle.  INTERVAL HISTORY: Susan Holder 64 y.o. female returns to the clinic today for follow-up visit.  The patient is a little bit depressed because of the recent disease progression.  She tolerated the last cycle of her treatment well with no concerning complaints.  She denied having any current chest pain, shortness of breath, cough or  hemoptysis.  She denied having any fever or chills.  She has no nausea, vomiting, diarrhea or constipation.  She has no headache or visual changes.  She is here today for evaluation before starting cycle #2.  MEDICAL HISTORY: Past Medical History:  Diagnosis Date  . met lung ca dx'd 09/2017   neck LN and brain 2020  . Rheumatoid arthritis ([place])     ALLERGIES:  has No Known Allergies.  MEDICATIONS:  Current Outpatient Medications  Medication Sig Dispense Refill  . aspirin EC 81 MG tablet Take 81 mg by mouth daily.    . diclofenac sodium (VOLTAREN) 1 % GEL Apply 2 g topically 4 (four) times daily.    . folic acid (FOLVITE) 1 MG tablet Take 1 tablet (1 mg total) by mouth daily. 30 tablet 4  . hydroxychloroquine (PLAQUENIL) 200 MG tablet TAKE 1 TABLET BY MOUTH TWICE DAILY MONDAY-FRIDAY 120 tablet 0  . naproxen sodium (ALEVE) 220 MG tablet Take 220 mg by mouth daily as needed (PAIN).    Marland Kitchen omeprazole (PRILOSEC) 20 MG capsule Take 20 mg by mouth daily.    Marland Kitchen Phenylephrine-DM-GG-APAP (DELSYM COUGH/COLD DAYTIME PO) Take by mouth as needed.     . prochlorperazine (COMPAZINE) 10 MG tablet Take 1 tablet (10 mg total) by mouth every 6 (six) hours as needed for nausea or vomiting. 30 tablet 0  . pseudoephedrine-guaifenesin (MUCINEX D) 60-600 MG 12 hr tablet Take 1 tablet by mouth as needed.     . umeclidinium-vilanterol (ANORO ELLIPTA) 62.5-25 MCG/INH AEPB Inhale 1 puff into the lungs daily. 1 each 0  . VITAMIN D PO Take by mouth.     No current  facility-administered medications for this visit.    SURGICAL HISTORY:  Past Surgical History:  Procedure Laterality Date  . BACK SURGERY    . BRONCHIAL NEEDLE ASPIRATION BIOPSY  11/09/2017   Procedure: BRONCHIAL NEEDLE ASPIRATION BIOPSIES;  Surgeon: Marshell Garfinkel, MD;  Location: [facility];  Service: Cardiopulmonary;;  . ENDOBRONCHIAL ULTRASOUND Bilateral 11/09/2017   Procedure: ENDOBRONCHIAL ULTRASOUND;  Surgeon: Marshell Garfinkel, MD;  Location: [facility];  Service: Cardiopulmonary;  Laterality: Bilateral;  . NASAL SINUS SURGERY    . NASAL SINUS SURGERY    . TUBAL LIGATION    . VIDEO BRONCHOSCOPY  11/09/2017   Procedure: VIDEO BRONCHOSCOPY;  Surgeon: Marshell Garfinkel, MD;  Location: [facility];  Service: Cardiopulmonary;;    REVIEW OF SYSTEMS:  A comprehensive review of systems was negative except for: Constitutional: positive for fatigue   PHYSICAL EXAMINATION: General appearance: alert, cooperative, fatigued and no distress Head: Normocephalic, without obvious abnormality, atraumatic Neck: no adenopathy, no JVD, supple, symmetrical, trachea midline and thyroid not enlarged, symmetric, no tenderness/mass/nodules Lymph nodes: Cervical, supraclavicular, and axillary nodes normal. Resp: clear to auscultation bilaterally Back: symmetric, no curvature. ROM normal. No CVA tenderness. Cardio: regular rate and rhythm, S1, S2 normal, no murmur, click, rub or gallop GI: soft, non-tender; bowel sounds normal; no masses,  no organomegaly Extremities: extremities normal, atraumatic, no cyanosis or edema  ECOG PERFORMANCE STATUS: 1 - Symptomatic but completely ambulatory  Blood pressure 135/76, pulse (!) 108, temperature 98.3 F (36.8 C), temperature source Temporal, resp. rate 20, height 5' (1.524 m), weight 148 lb 6.4 oz (67.3 kg), last menstrual period 04/28/2000, SpO2 100 %.  LABORATORY DATA: Lab Results  Component Value Date   WBC 4.3 09/06/2019   HGB 11.6 (L) 09/06/2019   HCT 35.6 (L) 09/06/2019   MCV 96.2 09/06/2019   PLT 308 09/06/2019      Chemistry      Component Value Date/Time   NA 138 08/30/2019 1146   K 4.0 08/30/2019 1146   CL 106 08/30/2019 1146   CO2 26 08/30/2019 1146   BUN 13 08/30/2019 1146   CREATININE 0.83 08/30/2019 1146   CREATININE 0.80 07/24/2017 1438      Component Value Date/Time   CALCIUM 9.3 08/30/2019 1146   ALKPHOS 103 08/30/2019 1146   AST 33 08/30/2019 1146   ALT 35 08/30/2019 1146    BILITOT 0.2 (L) 08/30/2019 1146       RADIOGRAPHIC STUDIES: CT Soft Tissue Neck W Contrast  Result Date: 08/08/2019 CLINICAL DATA:  Non-small cell lung carcinoma staging. History of right-sided cervical lymphadenopathy. EXAM: CT NECK WITH CONTRAST TECHNIQUE: Multidetector CT imaging of the neck was performed using the standard protocol following the bolus administration of intravenous contrast. CONTRAST:  40m OMNIPAQUE IOHEXOL 300 MG/ML  SOLN COMPARISON:  CT neck 05/09/2019 FINDINGS: Pharynx and larynx: Normal. No mass or swelling. Salivary glands: No inflammation, mass, or stone. Thyroid: Normal. Lymph nodes: Slightly decreased size of right supraclavicular nodal conglomerate measuring 1.7 x 1.4 cm, previously 2.1 x 1.4 cm (series 2, image 74, 75). Vascular: Negative. Limited intracranial: Limited visualization of previously described cerebellar lesions. Visualized orbits: Negative Mastoids and visualized paranasal sinuses: Clear Skeleton: Negative Upper chest: Please refer to dedicated report for CT of the chest. Other: None IMPRESSION: 1. Slightly decreased size of right supraclavicular nodal conglomerate. 2. No new abnormality of the neck. Electronically Signed   By: KUlyses JarredM.D.   On: 08/08/2019 22:00   CT Chest W Contrast  Result Date: 08/08/2019 CLINICAL DATA:  Non-small cell lung cancer, staging. Radiation therapy and chemotherapy complete. EXAM: CT CHEST WITH CONTRAST TECHNIQUE: Multidetector CT imaging of the chest was performed during intravenous contrast administration. CONTRAST:  15m OMNIPAQUE IOHEXOL 300 MG/ML  SOLN COMPARISON:  05/09/2019. FINDINGS: Cardiovascular: Atherosclerotic calcification of the aorta and coronary arteries. Heart size normal. No pericardial effusion. Mediastinum/Nodes: No pathologically enlarged mediastinal, hilar or axillary lymph nodes. Esophagus is mildly dilated and contains an air-fluid level. Lungs/Pleura: Centrilobular emphysema. Scarring at the apex  of the right lung. Parenchymal retraction and bronchiectasis are seen in the right perihilar region and right lower lobe, similar to the prior exam. 8 mm anterior lingular nodule (7/63), previously 2 mm. 7 mm anterior left lower lobe nodule (7/73), previously 3 mm. 4 mm left lower lobe nodule (7/61), stable. Previously measured 4 mm nodule in the anterior left lower lobe is no longer identified. No pleural fluid. Airway is otherwise unremarkable. Upper Abdomen: Visualized portions of the liver gallbladder, adrenal glands, kidneys, spleen, pancreas, stomach and bowel are grossly unremarkable. Musculoskeletal: Degenerative changes in the spine. No worrisome lytic or sclerotic lesions. IMPRESSION: 1. Enlarging nodules in the lingula and left lower lobe are most indicative of metastatic disease. 2. Additional 4 mm nodule in the left lower lobe is stable. 3. Post treatment parenchymal retraction and scarring in the right lung. 4. Aortic atherosclerosis (ICD10-I70.0). Coronary artery calcification. 5.  Emphysema (ICD10-J43.9). Electronically Signed   By: MLorin PicketM.D.   On: 08/08/2019 16:34    ASSESSMENT AND PLAN: This is a very pleasant 64years old African-American female with metastatic non-small cell lung cancer initially diagnosed with a stage IIIA non-small cell lung cancer, adenocarcinoma.  She underwent a course of concurrent chemoradiation with weekly carboplatin and paclitaxel status post 7 cycles with partial response.   The patient tolerated this course of treatment well except for mild odynophagia and dysphagia. She completed on consolidation treatment with immunotherapy with Imfinzi (Durvalumab) status post 18 cycles. She also completed SBRT to the right supraclavicular lymphadenopathy. The patient had evidence for multiple brain metastasis in October 2020 and she underwent SRS treatment to this lesion under the care of Dr. MLisbeth Renshaw The patient had evidence for disease progression and she  started systemic chemotherapy with carboplatin, Alimta and Keytruda status post 1 cycle. The patient tolerated the first cycle of her treatment well with no concerning adverse effects. I recommended for her to proceed with cycle #2 today as planned. She will come back for follow-up visit in 3 weeks for evaluation before the next cycle of her treatment. She was advised to call immediately if she has any concerning symptoms in the interval. The patient voices understanding of current disease status and treatment options and is in agreement with the current care plan. All questions were answered. The patient knows to call the clinic with any problems, questions or concerns. We can certainly see the patient much sooner if necessary.  Disclaimer: This note was dictated with voice recognition software. Similar sounding words can inadvertently be transcribed and may not be corrected upon review.

## 2019-09-06 NOTE — Patient Instructions (Signed)
[facility] Discharge Instructions for Patients Receiving Chemotherapy  Today you received the following chemotherapy agents: Keytruda, Alimta, Carboplatin  To help prevent nausea and vomiting after your treatment, we encourage you to take your nausea medication as directed.   If you develop nausea and vomiting that is not controlled by your nausea medication, call the clinic.   BELOW ARE SYMPTOMS THAT SHOULD BE REPORTED IMMEDIATELY:  *FEVER GREATER THAN 100.5 F  *CHILLS WITH OR WITHOUT FEVER  NAUSEA AND VOMITING THAT IS NOT CONTROLLED WITH YOUR NAUSEA MEDICATION  *UNUSUAL SHORTNESS OF BREATH  *UNUSUAL BRUISING OR BLEEDING  TENDERNESS IN MOUTH AND THROAT WITH OR WITHOUT PRESENCE OF ULCERS  *URINARY PROBLEMS  *BOWEL PROBLEMS  UNUSUAL RASH Items with * indicate a potential emergency and should be followed up as soon as possible.  Feel free to call the clinic should you have any questions or concerns. The clinic phone number is [phone].  Please show the [facility] at check-in to the Emergency Department and triage nurse.  Pembrolizumab injection What is this medicine? PEMBROLIZUMAB (pem broe liz ue mab) is a monoclonal antibody. It is used to treat certain types of cancer. This medicine may be used for other purposes; ask your health care provider or pharmacist if you have questions. COMMON BRAND NAME(S): Keytruda What should I tell my health care provider before I take this medicine? They need to know if you have any of these conditions:  diabetes  immune system problems  inflammatory bowel disease  liver disease  lung or breathing disease  lupus  received or scheduled to receive an organ transplant or a stem-cell transplant that uses donor stem cells  an unusual or allergic reaction to pembrolizumab, other medicines, foods, dyes, or preservatives  pregnant or trying to get pregnant  breast-feeding How should I use this  medicine? This medicine is for infusion into a vein. It is given by a health care professional in a hospital or clinic setting. A special MedGuide will be given to you before each treatment. Be sure to read this information carefully each time. Talk to your pediatrician regarding the use of this medicine in children. While this drug may be prescribed for children as young as 6 months for selected conditions, precautions do apply. Overdosage: If you think you have taken too much of this medicine contact a poison control center or emergency room at once. NOTE: This medicine is only for you. Do not share this medicine with others. What if I miss a dose? It is important not to miss your dose. Call your doctor or health care professional if you are unable to keep an appointment. What may interact with this medicine? Interactions have not been studied. Give your health care provider a list of all the medicines, herbs, non-prescription drugs, or dietary supplements you use. Also tell them if you smoke, drink alcohol, or use illegal drugs. Some items may interact with your medicine. This list may not describe all possible interactions. Give your health care provider a list of all the medicines, herbs, non-prescription drugs, or dietary supplements you use. Also tell them if you smoke, drink alcohol, or use illegal drugs. Some items may interact with your medicine. What should I watch for while using this medicine? Your condition will be monitored carefully while you are receiving this medicine. You may need blood work done while you are taking this medicine. Do not become pregnant while taking this medicine or for 4 months after stopping it.  Women should inform their doctor if they wish to become pregnant or think they might be pregnant. There is a potential for serious side effects to an unborn child. Talk to your health care professional or pharmacist for more information. Do not breast-feed an infant while  taking this medicine or for 4 months after the last dose. What side effects may I notice from receiving this medicine? Side effects that you should report to your doctor or health care professional as soon as possible:  allergic reactions like skin rash, itching or hives, swelling of the face, lips, or tongue  bloody or black, tarry  breathing problems  changes in vision  chest pain  chills  confusion  constipation  cough  diarrhea  dizziness or feeling faint or lightheaded  fast or irregular heartbeat  fever  flushing  joint pain  low blood counts - this medicine may decrease the number of white blood cells, red blood cells and platelets. You may be at increased risk for infections and bleeding.  muscle pain  muscle weakness  pain, tingling, numbness in the hands or feet  persistent headache  redness, blistering, peeling or loosening of the skin, including inside the mouth  signs and symptoms of high blood sugar such as dizziness; dry mouth; dry skin; fruity breath; nausea; stomach pain; increased hunger or thirst; increased urination  signs and symptoms of kidney injury like trouble passing urine or change in the amount of urine  signs and symptoms of liver injury like dark urine, light-colored stools, loss of appetite, nausea, right upper belly pain, yellowing of the eyes or skin  sweating  swollen lymph nodes  weight loss Side effects that usually do not require medical attention (report to your doctor or health care professional if they continue or are bothersome):  decreased appetite  hair loss  muscle pain  tiredness This list may not describe all possible side effects. Call your doctor for medical advice about side effects. You may report side effects to FDA at 1-800-FDA-1088. Where should I keep my medicine? This drug is given in a hospital or clinic and will not be stored at home. NOTE: This sheet is a summary. It may not cover all  possible information. If you have questions about this medicine, talk to your doctor, pharmacist, or health care provider.  2020 [employer]vier/Gold Standard (2019-02-18 18:07:58)  Pemetrexed injection What is this medicine? PEMETREXED (PEM e TREX ed) is a chemotherapy drug used to treat lung cancers like non-small cell lung cancer and mesothelioma. It may also be used to treat other cancers. This medicine may be used for other purposes; ask your health care provider or pharmacist if you have questions. COMMON BRAND NAME(S): Alimta What should I tell my health care provider before I take this medicine? They need to know if you have any of these conditions:  infection (especially a virus infection such as chickenpox, cold sores, or herpes)  kidney disease  low blood counts, like low white cell, platelet, or red cell counts  lung or breathing disease, like asthma  radiation therapy  an unusual or allergic reaction to pemetrexed, other medicines, foods, dyes, or preservative  pregnant or trying to get pregnant  breast-feeding How should I use this medicine? This drug is given as an infusion into a vein. It is administered in a hospital or clinic by a specially trained health care professional. Talk to your pediatrician regarding the use of this medicine in children. Special care may be needed. Overdosage: If  you think you have taken too much of this medicine contact a poison control center or emergency room at once. NOTE: This medicine is only for you. Do not share this medicine with others. What if I miss a dose? It is important not to miss your dose. Call your doctor or health care professional if you are unable to keep an appointment. What may interact with this medicine? This medicine may interact with the following medications:  Ibuprofen This list may not describe all possible interactions. Give your health care provider a list of all the medicines, herbs, non-prescription drugs,  or dietary supplements you use. Also tell them if you smoke, drink alcohol, or use illegal drugs. Some items may interact with your medicine. What should I watch for while using this medicine? Visit your doctor for checks on your progress. This drug may make you feel generally unwell. This is not uncommon, as chemotherapy can affect healthy cells as well as cancer cells. Report any side effects. Continue your course of treatment even though you feel ill unless your doctor tells you to stop. In some cases, you may be given additional medicines to help with side effects. Follow all directions for their use. Call your doctor or health care professional for advice if you get a fever, chills or sore throat, or other symptoms of a cold or flu. Do not treat yourself. This drug decreases your body's ability to fight infections. Try to avoid being around people who are sick. This medicine may increase your risk to bruise or bleed. Call your doctor or health care professional if you notice any unusual bleeding. Be careful brushing and flossing your teeth or using a toothpick because you may get an infection or bleed more easily. If you have any dental work done, tell your dentist you are receiving this medicine. Avoid taking products that contain aspirin, acetaminophen, ibuprofen, naproxen, or ketoprofen unless instructed by your doctor. These medicines may hide a fever. Call your doctor or health care professional if you get diarrhea or mouth sores. Do not treat yourself. To protect your kidneys, drink water or other fluids as directed while you are taking this medicine. Do not become pregnant while taking this medicine or for 6 months after stopping it. Women should inform their doctor if they wish to become pregnant or think they might be pregnant. Men should not father a child while taking this medicine and for 3 months after stopping it. This may interfere with the ability to father a child. You should talk to  your doctor or health care professional if you are concerned about your fertility. There is a potential for serious side effects to an unborn child. Talk to your health care professional or pharmacist for more information. Do not breast-feed an infant while taking this medicine or for 1 week after stopping it. What side effects may I notice from receiving this medicine? Side effects that you should report to your doctor or health care professional as soon as possible:  allergic reactions like skin rash, itching or hives, swelling of the face, lips, or tongue  breathing problems  redness, blistering, peeling or loosening of the skin, including inside the mouth  signs and symptoms of bleeding such as bloody or black, tarry stools; red or dark-brown urine; spitting up blood or brown material that looks like coffee grounds; red spots on the skin; unusual bruising or bleeding from the eye, gums, or nose  signs and symptoms of infection like fever or chills; cough;  sore throat; pain or trouble passing urine  signs and symptoms of kidney injury like trouble passing urine or change in the amount of urine  signs and symptoms of liver injury like dark yellow or brown urine; general ill feeling or flu-like symptoms; light-colored stools; loss of appetite; nausea; right upper belly pain; unusually weak or tired; yellowing of the eyes or skin Side effects that usually do not require medical attention (report to your doctor or health care professional if they continue or are bothersome):  constipation  mouth sores  nausea, vomiting  unusually weak or tired This list may not describe all possible side effects. Call your doctor for medical advice about side effects. You may report side effects to FDA at 1-800-FDA-1088. Where should I keep my medicine? This drug is given in a hospital or clinic and will not be stored at home. NOTE: This sheet is a summary. It may not cover all possible information. If  you have questions about this medicine, talk to your doctor, pharmacist, or health care provider.  2020 Elsevier/Gold Standard (2017-06-03 16:11:33)  Carboplatin injection What is this medicine? CARBOPLATIN (KAR boe pla tin) is a chemotherapy drug. It targets fast dividing cells, like cancer cells, and causes these cells to die. This medicine is used to treat ovarian cancer and many other cancers. This medicine may be used for other purposes; ask your health care provider or pharmacist if you have questions. COMMON BRAND NAME(S): Paraplatin What should I tell my health care provider before I take this medicine? They need to know if you have any of these conditions:  blood disorders  hearing problems  kidney disease  recent or ongoing radiation therapy  an unusual or allergic reaction to carboplatin, cisplatin, other chemotherapy, other medicines, foods, dyes, or preservatives  pregnant or trying to get pregnant  breast-feeding How should I use this medicine? This drug is usually given as an infusion into a vein. It is administered in a hospital or clinic by a specially trained health care professional. Talk to your pediatrician regarding the use of this medicine in children. Special care may be needed. Overdosage: If you think you have taken too much of this medicine contact a poison control center or emergency room at once. NOTE: This medicine is only for you. Do not share this medicine with others. What if I miss a dose? It is important not to miss a dose. Call your doctor or health care professional if you are unable to keep an appointment. What may interact with this medicine?  medicines for seizures  medicines to increase blood counts like filgrastim, pegfilgrastim, sargramostim  some antibiotics like amikacin, gentamicin, neomycin, streptomycin, tobramycin  vaccines Talk to your doctor or health care professional before taking any of these  medicines:  acetaminophen  aspirin  ibuprofen  ketoprofen  naproxen This list may not describe all possible interactions. Give your health care provider a list of all the medicines, herbs, non-prescription drugs, or dietary supplements you use. Also tell them if you smoke, drink alcohol, or use illegal drugs. Some items may interact with your medicine. What should I watch for while using this medicine? Your condition will be monitored carefully while you are receiving this medicine. You will need important blood work done while you are taking this medicine. This drug may make you feel generally unwell. This is not uncommon, as chemotherapy can affect healthy cells as well as cancer cells. Report any side effects. Continue your course of treatment even though you  feel ill unless your doctor tells you to stop. In some cases, you may be given additional medicines to help with side effects. Follow all directions for their use. Call your doctor or health care professional for advice if you get a fever, chills or sore throat, or other symptoms of a cold or flu. Do not treat yourself. This drug decreases your body's ability to fight infections. Try to avoid being around people who are sick. This medicine may increase your risk to bruise or bleed. Call your doctor or health care professional if you notice any unusual bleeding. Be careful brushing and flossing your teeth or using a toothpick because you may get an infection or bleed more easily. If you have any dental work done, tell your dentist you are receiving this medicine. Avoid taking products that contain aspirin, acetaminophen, ibuprofen, naproxen, or ketoprofen unless instructed by your doctor. These medicines may hide a fever. Do not become pregnant while taking this medicine. Women should inform their doctor if they wish to become pregnant or think they might be pregnant. There is a potential for serious side effects to an unborn child. Talk  to your health care professional or pharmacist for more information. Do not breast-feed an infant while taking this medicine. What side effects may I notice from receiving this medicine? Side effects that you should report to your doctor or health care professional as soon as possible:  allergic reactions like skin rash, itching or hives, swelling of the face, lips, or tongue  signs of infection - fever or chills, cough, sore throat, pain or difficulty passing urine  signs of decreased platelets or bleeding - bruising, pinpoint red spots on the skin, black, tarry stools, nosebleeds  signs of decreased red blood cells - unusually weak or tired, fainting spells, lightheadedness  breathing problems  changes in hearing  changes in vision  chest pain  high blood pressure  low blood counts - This drug may decrease the number of white blood cells, red blood cells and platelets. You may be at increased risk for infections and bleeding.  nausea and vomiting  pain, swelling, redness or irritation at the injection site  pain, tingling, numbness in the hands or feet  problems with balance, talking, walking  trouble passing urine or change in the amount of urine Side effects that usually do not require medical attention (report to your doctor or health care professional if they continue or are bothersome):  hair loss  loss of appetite  metallic taste in the mouth or changes in taste This list may not describe all possible side effects. Call your doctor for medical advice about side effects. You may report side effects to FDA at 1-800-FDA-1088. Where should I keep my medicine? This drug is given in a hospital or clinic and will not be stored at home. NOTE: This sheet is a summary. It may not cover all possible information. If you have questions about this medicine, talk to your doctor, pharmacist, or health care provider.  2020 Elsevier/Gold Standard (2007-07-20 14:38:05)

## 2019-09-07 ENCOUNTER — Ambulatory Visit
Admission: RE | Admit: 2019-09-07 | Discharge: 2019-09-07 | Disposition: A | Discharge status: Home / Self Care | Payer: Medicare PPO | Source: Ambulatory Visit | Attending: Radiation Oncology | Admitting: Radiation Oncology

## 2019-09-07 ENCOUNTER — Other Ambulatory Visit: Payer: Medicare PPO

## 2019-09-07 DIAGNOSIS — C7931 Secondary malignant neoplasm of brain: Secondary | ICD-10-CM

## 2019-09-07 MED ORDER — GADOBENATE DIMEGLUMINE 529 MG/ML IV SOLN
13.0000 mL | Freq: Once | INTRAVENOUS | Status: AC | PRN
  Administered 2019-09-07: 13 mL via INTRAVENOUS

## 2019-09-08 ENCOUNTER — Other Ambulatory Visit: Payer: Self-pay

## 2019-09-08 ENCOUNTER — Telehealth: Payer: Self-pay | Admitting: Internal Medicine

## 2019-09-08 ENCOUNTER — Ambulatory Visit
Admission: RE | Admit: 2019-09-08 | Discharge: 2019-09-08 | Disposition: A | Discharge status: Home / Self Care | Payer: Medicare PPO | Source: Ambulatory Visit | Attending: Radiation Oncology | Admitting: Radiation Oncology

## 2019-09-08 DIAGNOSIS — C7931 Secondary malignant neoplasm of brain: Secondary | ICD-10-CM

## 2019-09-08 NOTE — Progress Notes (Signed)
Radiation Oncology         [phone] ________________________________  Outpatient Follow Up - Conducted via telephone due to current COVID-19 concerns for limiting patient exposure  I spoke with the patient to conduct this consult visit via telephone to spare the patient unnecessary potential exposure in the healthcare setting during the current COVID-19 pandemic. The patient was notified in advance and was offered a [place] meeting to allow for face to face communication but unfortunately reported that they did not have the appropriate resources/technology to support such a visit and instead preferred to proceed with a telephone visit.   ________________________________  Name: Susan Holder MRN: [id number]  Date of Service: 09/08/2019  DOB: [birth year]  Post Treatment Telephone Note  DIAGNOSIS:   Progressive MetastaticStage IIIA, cT3N1-2M0 NSCLC, adenocarcinoma of the right lower lobe with brain metastases  INTERVAL SINCE LAST RADIOTHERAPY: 6 months 03/09/2019 SRS Treatment: Each site was treated to 20 Gy in a single fraction PTV1 Post Cerebellum 32m PTV2 Lt Cerebellum 180mPTV3 Lo Rt Vent 80m99mTV4 Rt Frontal 5mm42mV5 Lt Frontal Operc 5mm 37m6 Lt Sylv 5mm  44m1/2020-12/20/2018: The patient's right supraclavicular nodes were treated to 60 Gy over 25 fractions.  11/30/17-01/11/18:  66 Gy to the right chest and regional nodes over 33 fractions  NARRATIVE: Susan Holder-known 63 y.o79 patient to our service with a history of adenocarcinoma of the right lower lobe.  When she was originally diagnosed in the summer 2019 she presented with local regional disease and received chemoradiation.  Unfortunately she recurred in the summer 2020, she did receive palliative radiotherapy to the right supraclavicular node, and in November 2020 was found to have 6 lesions in the brain felt to be metastatic for which she underwent SRS treatment in November 2020.  She tolerated this most recent  treatment quite well, her most recent post treatment MRI yesterday revealed continued improvement with only small lesions in the cerebellum, both of the two measured 5 mm, and a 3 mm enhancing lesion in the right frontal lobe that was unchanged. No new disease was noted. Unfortunately she has had local progression in the chest and has restarted systemic therapy with carboplatin/alimta and Keytruda. She is contacted today to review her MRI results.   On review of systems, the patient reports that he is doing well overall. She denies any chest pain, shortness of breath,  fevers, chills, night sweats, unintended weight changes. She does have a continuous chronic dry cough. She has been working with Dr. MannamVaughan Brownere what can be done about this. She denies any headaches, visual, or auditory changes. She denies any movement dysfunction. She denies any bowel or bladder disturbances, and denies abdominal pain, nausea or vomiting. She denies any new musculoskeletal or joint aches or pains, new skin lesions or concerns. A complete review of systems is obtained and is otherwise negative.   PAST MEDICAL HISTORY:  Past Medical History:  Diagnosis Date  . met lung ca dx'd 09/2017   neck LN and brain 2020  . Rheumatoid arthritis (HCC)  [place] Past Surgical History:  Procedure Laterality Date  . BACK SURGERY    . BRONCHIAL NEEDLE ASPIRATION BIOPSY  11/09/2017   Procedure: BRONCHIAL NEEDLE ASPIRATION BIOPSIES;  Surgeon: MannamMarshell Garfinkel Location: [facility];  Service: Cardiopulmonary;;  . ENDOBRONCHIAL ULTRASOUND Bilateral 11/09/2017   Procedure: ENDOBRONCHIAL ULTRASOUND;  Surgeon: MannamMarshell Garfinkel Location: [facility];  Service: Cardiopulmonary;  Laterality: Bilateral;  .  NASAL SINUS SURGERY    . NASAL SINUS SURGERY    . TUBAL LIGATION    . VIDEO BRONCHOSCOPY  11/09/2017   Procedure: VIDEO BRONCHOSCOPY;  Surgeon: Marshell Garfinkel, MD;  Location: [facility];  Service:  Cardiopulmonary;;    PAST SOCIAL HISTORY:  Social History   Socioeconomic History  . Marital status: Divorced    Spouse name: Not on file  . Number of children: Not on file  . Years of education: Not on file  . Highest education level: Not on file  Occupational History  . Not on file  Tobacco Use  . Smoking status: Former Smoker    Packs/day: 0.50    Years: 45.00    Pack years: 22.50    Types: Cigarettes    Quit date: 09/29/2017    Years since quitting: 1.9  . Smokeless tobacco: Never Used  Substance and Sexual Activity  . Alcohol use: Not Currently  . Drug use: No  . Sexual activity: Not Currently  Other Topics Concern  . Not on file  Social History Narrative  . Not on file   Social Determinants of Health   Financial Resource Strain:   . Difficulty of Paying Living Expenses:   Food Insecurity:   . Worried About Charity fundraiser in the Last Year:   . Arboriculturist in the Last Year:   Transportation Needs:   . Film/video editor (Medical):   Marland Kitchen Lack of Transportation (Non-Medical):   Physical Activity:   . Days of Exercise per Week:   . Minutes of Exercise per Session:   Stress:   . Feeling of Stress :   Social Connections:   . Frequency of Communication with Friends and Family:   . Frequency of Social Gatherings with Friends and Family:   . Attends Religious Services:   . Active Member of Clubs or Organizations:   . Attends Archivist Meetings:   Marland Kitchen Marital Status:   Intimate Partner Violence:   . Fear of Current or Ex-Partner:   . Emotionally Abused:   Marland Kitchen Physically Abused:   . Sexually Abused:   The patient is divorced.  She lives in [place].  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  PAST FAMILY HISTORY: Family History  Problem Relation Age of Onset  . Stroke Mother   . Alzheimer's disease Mother   . Heart disease Mother   . Emphysema Father   . Hypertension Brother   . Heart attack Maternal Aunt   . Heart failure  Maternal Grandmother   . Hypertension Paternal Grandmother     MEDICATIONS  Current Outpatient Medications  Medication Sig Dispense Refill  . aspirin EC 81 MG tablet Take 81 mg by mouth daily.    . diclofenac sodium (VOLTAREN) 1 % GEL Apply 2 g topically 4 (four) times daily.    . folic acid (FOLVITE) 1 MG tablet Take 1 tablet (1 mg total) by mouth daily. 30 tablet 4  . hydroxychloroquine (PLAQUENIL) 200 MG tablet TAKE 1 TABLET BY MOUTH TWICE DAILY MONDAY-FRIDAY 120 tablet 0  . naproxen sodium (ALEVE) 220 MG tablet Take 220 mg by mouth daily as needed (PAIN).    Marland Kitchen omeprazole (PRILOSEC) 20 MG capsule Take 20 mg by mouth daily.    Marland Kitchen Phenylephrine-DM-GG-APAP (DELSYM COUGH/COLD DAYTIME PO) Take by mouth as needed.     . prochlorperazine (COMPAZINE) 10 MG tablet Take 1 tablet (10 mg total) by mouth every 6 (six) hours as needed  for nausea or vomiting. 30 tablet 0  . pseudoephedrine-guaifenesin (MUCINEX D) 60-600 MG 12 hr tablet Take 1 tablet by mouth as needed.     . umeclidinium-vilanterol (ANORO ELLIPTA) 62.5-25 MCG/INH AEPB Inhale 1 puff into the lungs daily. 1 each 0  . VITAMIN D PO Take by mouth.     No current facility-administered medications for this encounter.    ALLERGIES: No Known Allergies  PHYSICAL EXAM:  Unable to assess due to encounter type.   IMPRESSION/PLAN: 1. Progressive MetastaticStage IIIA, cT3N1-2M0 NSCLC, adenocarcinoma of the right lower lobe with brain metastases.  The patient is doing very well in the CNS system, she is radiographically doing well and continued improvement was noted on her scan. She will be discussed in conference and I'll ask our navigator to reach out to her on Monday to confirm our discussion of her case. Otherwise, we anticipate a surveiallance MRI in 45-month, and if she continues to show improved response to treatment, we would anticipate extending those intervals to 4 months next year.  She will also continue to follow up with Dr. MJulien Nordmann with her systemic therapy. 2. Chronic cough. The patient is going to continue to follow up with Dr. MVaughan Browneras she was given a newer inhaler.  Given current concerns for patient exposure during the COVID-19 pandemic, this encounter was conducted via telephone.  The patient has given verbal consent for this type of encounter. The time spent during this encounter was 25 minutes and 50% of that time was spent in preparation, discussion, and in the coordination of her care. The attendants for this meeting include AShona Simpson [facility]and SFarrel Gobble During the encounter, AShona [facility]PMer[facility]was [facility]. Susan Holder was located at home.    ACarola Rhine PAC

## 2019-09-08 NOTE — Telephone Encounter (Signed)
Scheduled per los. Called and left msg. Mailed printout  °

## 2019-09-12 ENCOUNTER — Encounter: Payer: Self-pay | Admitting: General Practice

## 2019-09-12 ENCOUNTER — Inpatient Hospital Stay: Payer: Medicare PPO
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
## 2019-09-13 ENCOUNTER — Other Ambulatory Visit: Payer: Self-pay

## 2019-09-13 ENCOUNTER — Inpatient Hospital Stay: Payer: Medicare PPO

## 2019-09-13 ENCOUNTER — Telehealth: Payer: Medicare PPO | Admitting: Radiation Oncology

## 2019-09-13 DIAGNOSIS — C3491 Malignant neoplasm of unspecified part of right bronchus or lung: Secondary | ICD-10-CM

## 2019-09-13 DIAGNOSIS — Z5111 Encounter for antineoplastic chemotherapy: Secondary | ICD-10-CM | POA: Diagnosis not present

## 2019-09-13 LAB — CBC WITH DIFFERENTIAL (CANCER CENTER ONLY)
Abs Immature Granulocytes: 0 10*3/uL (ref 0.00–0.07)
Basophils Absolute: 0 10*3/uL (ref 0.0–0.1)
Basophils Relative: 1 %
Eosinophils Absolute: 0 10*3/uL (ref 0.0–0.5)
Eosinophils Relative: 1 %
HCT: 35.4 % — ABNORMAL LOW (ref 36.0–46.0)
Hemoglobin: 11.6 g/dL — ABNORMAL LOW (ref 12.0–15.0)
Immature Granulocytes: 0 %
Lymphocytes Relative: 48 %
Lymphs Abs: 0.9 10*3/uL (ref 0.7–4.0)
MCH: 31.4 pg (ref 26.0–34.0)
MCHC: 32.8 g/dL (ref 30.0–36.0)
MCV: 95.7 fL (ref 80.0–100.0)
Monocytes Absolute: 0.2 10*3/uL (ref 0.1–1.0)
Monocytes Relative: 8 %
Neutro Abs: 0.8 10*3/uL — ABNORMAL LOW (ref 1.7–7.7)
Neutrophils Relative %: 42 %
Platelet Count: 184 10*3/uL (ref 150–400)
RBC: 3.7 MIL/uL — ABNORMAL LOW (ref 3.87–5.11)
RDW: 13.4 % (ref 11.5–15.5)
WBC Count: 1.9 10*3/uL — ABNORMAL LOW (ref 4.0–10.5)
nRBC: 0 % (ref 0.0–0.2)

## 2019-09-13 LAB — CMP (CANCER CENTER ONLY)
ALT: 20 U/L (ref 0–44)
AST: 26 U/L (ref 15–41)
Albumin: 3.6 g/dL (ref 3.5–5.0)
Alkaline Phosphatase: 109 U/L (ref 38–126)
Anion gap: 10 (ref 5–15)
BUN: 10 mg/dL (ref 8–23)
CO2: 24 mmol/L (ref 22–32)
Calcium: 9.3 mg/dL (ref 8.9–10.3)
Chloride: 105 mmol/L (ref 98–111)
Creatinine: 0.83 mg/dL (ref 0.44–1.00)
GFR, Est AFR Am: >60 mL/min (ref >60)
GFR, Estimated: >60 mL/min (ref >60)
Glucose, Bld: 113 mg/dL — ABNORMAL HIGH (ref 70–99)
Potassium: 3.7 mmol/L (ref 3.5–5.1)
Sodium: 139 mmol/L (ref 135–145)
Total Bilirubin: 0.3 mg/dL (ref 0.3–1.2)
Total Protein: 7.4 g/dL (ref 6.5–8.1)

## 2019-09-14 ENCOUNTER — Telehealth: Payer: Self-pay | Admitting: Radiation Oncology

## 2019-09-15 ENCOUNTER — Encounter: Payer: Self-pay | Admitting: General Practice

## 2019-09-15 NOTE — Progress Notes (Signed)
[place] [place] Spiritual Care Note  Reached Susan Holder by phone at a better time for follow-up support. She continues to process the fact that she has a potentially fatal illness. One of her biggest sources of motivation is making arrangements to provide the best life possible for her granddaughter Susan Holder. To that end, Susan Holder is both making legal/financial arrangements and looking forward to a special family vacation to [place] in July. She describes herself as a "private person" and appreciates Spiritual Care as a resource for processing feelings and plans. We plan to follow up in person at her next treatment (6/1), and she has my direct dial number to reach out anytime.   [place], [place], [facility] Pager [phone] Voicemail [phone]

## 2019-09-20 ENCOUNTER — Other Ambulatory Visit: Payer: Self-pay

## 2019-09-20 ENCOUNTER — Inpatient Hospital Stay: Payer: Medicare PPO

## 2019-09-20 DIAGNOSIS — Z5111 Encounter for antineoplastic chemotherapy: Secondary | ICD-10-CM | POA: Diagnosis not present

## 2019-09-20 DIAGNOSIS — C3491 Malignant neoplasm of unspecified part of right bronchus or lung: Secondary | ICD-10-CM

## 2019-09-20 LAB — CBC WITH DIFFERENTIAL (CANCER CENTER ONLY)
Abs Immature Granulocytes: 0.01 10*3/uL (ref 0.00–0.07)
Basophils Absolute: 0 10*3/uL (ref 0.0–0.1)
Basophils Relative: 0 %
Eosinophils Absolute: 0 10*3/uL (ref 0.0–0.5)
Eosinophils Relative: 1 %
HCT: 35.5 % — ABNORMAL LOW (ref 36.0–46.0)
Hemoglobin: 11.7 g/dL — ABNORMAL LOW (ref 12.0–15.0)
Immature Granulocytes: 0 %
Lymphocytes Relative: 36 %
Lymphs Abs: 1.1 10*3/uL (ref 0.7–4.0)
MCH: 31.8 pg (ref 26.0–34.0)
MCHC: 33 g/dL (ref 30.0–36.0)
MCV: 96.5 fL (ref 80.0–100.0)
Monocytes Absolute: 0.7 10*3/uL (ref 0.1–1.0)
Monocytes Relative: 23 %
Neutro Abs: 1.3 10*3/uL — ABNORMAL LOW (ref 1.7–7.7)
Neutrophils Relative %: 40 %
Platelet Count: 203 10*3/uL (ref 150–400)
RBC: 3.68 MIL/uL — ABNORMAL LOW (ref 3.87–5.11)
RDW: 13.6 % (ref 11.5–15.5)
WBC Count: 3.2 10*3/uL — ABNORMAL LOW (ref 4.0–10.5)
nRBC: 0 % (ref 0.0–0.2)

## 2019-09-20 LAB — CMP (CANCER CENTER ONLY)
ALT: 30 U/L (ref 0–44)
AST: 35 U/L (ref 15–41)
Albumin: 3.7 g/dL (ref 3.5–5.0)
Alkaline Phosphatase: 112 U/L (ref 38–126)
Anion gap: 8 (ref 5–15)
BUN: 11 mg/dL (ref 8–23)
CO2: 24 mmol/L (ref 22–32)
Calcium: 9.5 mg/dL (ref 8.9–10.3)
Chloride: 106 mmol/L (ref 98–111)
Creatinine: 0.91 mg/dL (ref 0.44–1.00)
GFR, Est AFR Am: >60 mL/min (ref >60)
GFR, Estimated: >60 mL/min (ref >60)
Glucose, Bld: 105 mg/dL — ABNORMAL HIGH (ref 70–99)
Potassium: 3.8 mmol/L (ref 3.5–5.1)
Sodium: 138 mmol/L (ref 135–145)
Total Bilirubin: 0.2 mg/dL — ABNORMAL LOW (ref 0.3–1.2)
Total Protein: 7.7 g/dL (ref 6.5–8.1)

## 2019-09-27 ENCOUNTER — Encounter: Payer: Self-pay | Admitting: Internal Medicine

## 2019-09-27 ENCOUNTER — Encounter: Payer: Self-pay | Admitting: General Practice

## 2019-09-27 ENCOUNTER — Inpatient Hospital Stay: Payer: Medicare PPO

## 2019-09-27 ENCOUNTER — Other Ambulatory Visit: Payer: Self-pay

## 2019-09-27 ENCOUNTER — Inpatient Hospital Stay: Payer: Medicare PPO | Attending: Internal Medicine | Admitting: Internal Medicine

## 2019-09-27 VITALS — BP 121/69 | HR 102 | Temp 97.5°F | Resp 20 | Ht 60.0 in | Wt 149.1 lb

## 2019-09-27 DIAGNOSIS — R591 Generalized enlarged lymph nodes: Secondary | ICD-10-CM | POA: Diagnosis not present

## 2019-09-27 DIAGNOSIS — Z79899 Other long term (current) drug therapy: Secondary | ICD-10-CM | POA: Diagnosis not present

## 2019-09-27 DIAGNOSIS — Z5112 Encounter for antineoplastic immunotherapy: Secondary | ICD-10-CM | POA: Diagnosis present

## 2019-09-27 DIAGNOSIS — R110 Nausea: Secondary | ICD-10-CM | POA: Diagnosis not present

## 2019-09-27 DIAGNOSIS — Z5111 Encounter for antineoplastic chemotherapy: Secondary | ICD-10-CM | POA: Insufficient documentation

## 2019-09-27 DIAGNOSIS — C3431 Malignant neoplasm of lower lobe, right bronchus or lung: Secondary | ICD-10-CM

## 2019-09-27 DIAGNOSIS — M069 Rheumatoid arthritis, unspecified: Secondary | ICD-10-CM | POA: Diagnosis not present

## 2019-09-27 DIAGNOSIS — C3490 Malignant neoplasm of unspecified part of unspecified bronchus or lung: Secondary | ICD-10-CM | POA: Diagnosis not present

## 2019-09-27 DIAGNOSIS — C3491 Malignant neoplasm of unspecified part of right bronchus or lung: Secondary | ICD-10-CM

## 2019-09-27 DIAGNOSIS — C7931 Secondary malignant neoplasm of brain: Secondary | ICD-10-CM | POA: Diagnosis not present

## 2019-09-27 DIAGNOSIS — C770 Secondary and unspecified malignant neoplasm of lymph nodes of head, face and neck: Secondary | ICD-10-CM

## 2019-09-27 DIAGNOSIS — R1310 Dysphagia, unspecified: Secondary | ICD-10-CM | POA: Insufficient documentation

## 2019-09-27 LAB — CMP (CANCER CENTER ONLY)
ALT: 18 U/L (ref 0–44)
AST: 26 U/L (ref 15–41)
Albumin: 3.7 g/dL (ref 3.5–5.0)
Alkaline Phosphatase: 98 U/L (ref 38–126)
Anion gap: 12 (ref 5–15)
BUN: 10 mg/dL (ref 8–23)
CO2: 20 mmol/L — ABNORMAL LOW (ref 22–32)
Calcium: 9.9 mg/dL (ref 8.9–10.3)
Chloride: 106 mmol/L (ref 98–111)
Creatinine: 0.94 mg/dL (ref 0.44–1.00)
GFR, Est AFR Am: >60 mL/min (ref >60)
GFR, Estimated: >60 mL/min (ref >60)
Glucose, Bld: 86 mg/dL (ref 70–99)
Potassium: 4 mmol/L (ref 3.5–5.1)
Sodium: 138 mmol/L (ref 135–145)
Total Bilirubin: 0.2 mg/dL — ABNORMAL LOW (ref 0.3–1.2)
Total Protein: 7.4 g/dL (ref 6.5–8.1)

## 2019-09-27 LAB — CBC WITH DIFFERENTIAL (CANCER CENTER ONLY)
Abs Immature Granulocytes: 0.01 10*3/uL (ref 0.00–0.07)
Basophils Absolute: 0 10*3/uL (ref 0.0–0.1)
Basophils Relative: 0 %
Eosinophils Absolute: 0 10*3/uL (ref 0.0–0.5)
Eosinophils Relative: 0 %
HCT: 34 % — ABNORMAL LOW (ref 36.0–46.0)
Hemoglobin: 11.2 g/dL — ABNORMAL LOW (ref 12.0–15.0)
Immature Granulocytes: 0 %
Lymphocytes Relative: 28 %
Lymphs Abs: 1 10*3/uL (ref 0.7–4.0)
MCH: 31.8 pg (ref 26.0–34.0)
MCHC: 32.9 g/dL (ref 30.0–36.0)
MCV: 96.6 fL (ref 80.0–100.0)
Monocytes Absolute: 0.7 10*3/uL (ref 0.1–1.0)
Monocytes Relative: 21 %
Neutro Abs: 1.8 10*3/uL (ref 1.7–7.7)
Neutrophils Relative %: 51 %
Platelet Count: 293 10*3/uL (ref 150–400)
RBC: 3.52 MIL/uL — ABNORMAL LOW (ref 3.87–5.11)
RDW: 14.5 % (ref 11.5–15.5)
WBC Count: 3.6 10*3/uL — ABNORMAL LOW (ref 4.0–10.5)
nRBC: 0 % (ref 0.0–0.2)

## 2019-09-27 LAB — TSH: TSH: 2.17 u[IU]/mL (ref 0.350–4.500)

## 2019-09-27 MED ORDER — SODIUM CHLORIDE 0.9 % IV SOLN
420.5000 mg | Freq: Once | INTRAVENOUS | Status: AC
  Administered 2019-09-27: 420 mg via INTRAVENOUS
  Filled 2019-09-27: qty 42

## 2019-09-27 MED ORDER — SODIUM CHLORIDE 0.9 % IV SOLN
150.0000 mg | Freq: Once | INTRAVENOUS | Status: AC
  Administered 2019-09-27: 150 mg via INTRAVENOUS
  Filled 2019-09-27: qty 150

## 2019-09-27 MED ORDER — PALONOSETRON HCL INJECTION 0.25 MG/5ML
0.2500 mg | Freq: Once | INTRAVENOUS | Status: AC
  Administered 2019-09-27: 0.25 mg via INTRAVENOUS

## 2019-09-27 MED ORDER — CYANOCOBALAMIN 1000 MCG/ML IJ SOLN
1000.0000 ug | Freq: Once | INTRAMUSCULAR | Status: AC
  Administered 2019-09-27: 1000 ug via INTRAMUSCULAR

## 2019-09-27 MED ORDER — SODIUM CHLORIDE 0.9 % IV SOLN
500.0000 mg/m2 | Freq: Once | INTRAVENOUS | Status: AC
  Administered 2019-09-27: 800 mg via INTRAVENOUS
  Filled 2019-09-27: qty 12

## 2019-09-27 MED ORDER — SODIUM CHLORIDE 0.9 % IV SOLN
Freq: Once | INTRAVENOUS | Status: AC
  Filled 2019-09-27: qty 250

## 2019-09-27 MED ORDER — SODIUM CHLORIDE 0.9 % IV SOLN
10.0000 mg | Freq: Once | INTRAVENOUS | Status: AC
  Administered 2019-09-27: 10 mg via INTRAVENOUS
  Filled 2019-09-27: qty 10

## 2019-09-27 MED ORDER — SODIUM CHLORIDE 0.9 % IV SOLN
200.0000 mg | Freq: Once | INTRAVENOUS | Status: AC
  Administered 2019-09-27: 200 mg via INTRAVENOUS
  Filled 2019-09-27: qty 8

## 2019-09-27 MED ORDER — PALONOSETRON HCL INJECTION 0.25 MG/5ML
INTRAVENOUS | Status: AC
  Filled 2019-09-27: qty 5

## 2019-09-27 MED ORDER — CYANOCOBALAMIN 1000 MCG/ML IJ SOLN
INTRAMUSCULAR | Status: AC
  Filled 2019-09-27: qty 1

## 2019-09-27 NOTE — Progress Notes (Signed)
[place] Spiritual Care Note  Met Susan Holder in person in infusion today, keeping the visit brief for privacy's sake. She prefers to follow up by phone, which we will do between treatments.   [place], [place], [facility] Pager [phone] Voicemail [phone]

## 2019-09-27 NOTE — Patient Instructions (Signed)
[facility] Discharge Instructions for Patients Receiving Chemotherapy  Today you received the following chemotherapy agents: Keytruda, Alimta, Carboplatin  To help prevent nausea and vomiting after your treatment, we encourage you to take your nausea medication as directed.   If you develop nausea and vomiting that is not controlled by your nausea medication, call the clinic.   BELOW ARE SYMPTOMS THAT SHOULD BE REPORTED IMMEDIATELY:  *FEVER GREATER THAN 100.5 F  *CHILLS WITH OR WITHOUT FEVER  NAUSEA AND VOMITING THAT IS NOT CONTROLLED WITH YOUR NAUSEA MEDICATION  *UNUSUAL SHORTNESS OF BREATH  *UNUSUAL BRUISING OR BLEEDING  TENDERNESS IN MOUTH AND THROAT WITH OR WITHOUT PRESENCE OF ULCERS  *URINARY PROBLEMS  *BOWEL PROBLEMS  UNUSUAL RASH Items with * indicate a potential emergency and should be followed up as soon as possible.  Feel free to call the clinic should you have any questions or concerns. The clinic phone number is [phone].  Please show the [facility] at check-in to the Emergency Department and triage nurse.  Pembrolizumab injection What is this medicine? PEMBROLIZUMAB (pem broe liz ue mab) is a monoclonal antibody. It is used to treat certain types of cancer. This medicine may be used for other purposes; ask your health care provider or pharmacist if you have questions. COMMON BRAND NAME(S): Keytruda What should I tell my health care provider before I take this medicine? They need to know if you have any of these conditions:  diabetes  immune system problems  inflammatory bowel disease  liver disease  lung or breathing disease  lupus  received or scheduled to receive an organ transplant or a stem-cell transplant that uses donor stem cells  an unusual or allergic reaction to pembrolizumab, other medicines, foods, dyes, or preservatives  pregnant or trying to get pregnant  breast-feeding How should I use this  medicine? This medicine is for infusion into a vein. It is given by a health care professional in a hospital or clinic setting. A special MedGuide will be given to you before each treatment. Be sure to read this information carefully each time. Talk to your pediatrician regarding the use of this medicine in children. While this drug may be prescribed for children as young as 6 months for selected conditions, precautions do apply. Overdosage: If you think you have taken too much of this medicine contact a poison control center or emergency room at once. NOTE: This medicine is only for you. Do not share this medicine with others. What if I miss a dose? It is important not to miss your dose. Call your doctor or health care professional if you are unable to keep an appointment. What may interact with this medicine? Interactions have not been studied. Give your health care provider a list of all the medicines, herbs, non-prescription drugs, or dietary supplements you use. Also tell them if you smoke, drink alcohol, or use illegal drugs. Some items may interact with your medicine. This list may not describe all possible interactions. Give your health care provider a list of all the medicines, herbs, non-prescription drugs, or dietary supplements you use. Also tell them if you smoke, drink alcohol, or use illegal drugs. Some items may interact with your medicine. What should I watch for while using this medicine? Your condition will be monitored carefully while you are receiving this medicine. You may need blood work done while you are taking this medicine. Do not become pregnant while taking this medicine or for 4 months after stopping it.  Women should inform their doctor if they wish to become pregnant or think they might be pregnant. There is a potential for serious side effects to an unborn child. Talk to your health care professional or pharmacist for more information. Do not breast-feed an infant while  taking this medicine or for 4 months after the last dose. What side effects may I notice from receiving this medicine? Side effects that you should report to your doctor or health care professional as soon as possible:  allergic reactions like skin rash, itching or hives, swelling of the face, lips, or tongue  bloody or black, tarry  breathing problems  changes in vision  chest pain  chills  confusion  constipation  cough  diarrhea  dizziness or feeling faint or lightheaded  fast or irregular heartbeat  fever  flushing  joint pain  low blood counts - this medicine may decrease the number of white blood cells, red blood cells and platelets. You may be at increased risk for infections and bleeding.  muscle pain  muscle weakness  pain, tingling, numbness in the hands or feet  persistent headache  redness, blistering, peeling or loosening of the skin, including inside the mouth  signs and symptoms of high blood sugar such as dizziness; dry mouth; dry skin; fruity breath; nausea; stomach pain; increased hunger or thirst; increased urination  signs and symptoms of kidney injury like trouble passing urine or change in the amount of urine  signs and symptoms of liver injury like dark urine, light-colored stools, loss of appetite, nausea, right upper belly pain, yellowing of the eyes or skin  sweating  swollen lymph nodes  weight loss Side effects that usually do not require medical attention (report to your doctor or health care professional if they continue or are bothersome):  decreased appetite  hair loss  muscle pain  tiredness This list may not describe all possible side effects. Call your doctor for medical advice about side effects. You may report side effects to FDA at 1-800-FDA-1088. Where should I keep my medicine? This drug is given in a hospital or clinic and will not be stored at home. NOTE: This sheet is a summary. It may not cover all  possible information. If you have questions about this medicine, talk to your doctor, pharmacist, or health care provider.  2020 Elsevier/Gold Standard (2019-02-18 18:07:58)  Pemetrexed injection What is this medicine? PEMETREXED (PEM e TREX ed) is a chemotherapy drug used to treat lung cancers like non-small cell lung cancer and mesothelioma. It may also be used to treat other cancers. This medicine may be used for other purposes; ask your health care provider or pharmacist if you have questions. COMMON BRAND NAME(S): Alimta What should I tell my health care provider before I take this medicine? They need to know if you have any of these conditions:  infection (especially a virus infection such as chickenpox, cold sores, or herpes)  kidney disease  low blood counts, like low white cell, platelet, or red cell counts  lung or breathing disease, like asthma  radiation therapy  an unusual or allergic reaction to pemetrexed, other medicines, foods, dyes, or preservative  pregnant or trying to get pregnant  breast-feeding How should I use this medicine? This drug is given as an infusion into a vein. It is administered in a hospital or clinic by a specially trained health care professional. Talk to your pediatrician regarding the use of this medicine in children. Special care may be needed. Overdosage: If  you think you have taken too much of this medicine contact a poison control center or emergency room at once. NOTE: This medicine is only for you. Do not share this medicine with others. What if I miss a dose? It is important not to miss your dose. Call your doctor or health care professional if you are unable to keep an appointment. What may interact with this medicine? This medicine may interact with the following medications:  Ibuprofen This list may not describe all possible interactions. Give your health care provider a list of all the medicines, herbs, non-prescription drugs,  or dietary supplements you use. Also tell them if you smoke, drink alcohol, or use illegal drugs. Some items may interact with your medicine. What should I watch for while using this medicine? Visit your doctor for checks on your progress. This drug may make you feel generally unwell. This is not uncommon, as chemotherapy can affect healthy cells as well as cancer cells. Report any side effects. Continue your course of treatment even though you feel ill unless your doctor tells you to stop. In some cases, you may be given additional medicines to help with side effects. Follow all directions for their use. Call your doctor or health care professional for advice if you get a fever, chills or sore throat, or other symptoms of a cold or flu. Do not treat yourself. This drug decreases your body's ability to fight infections. Try to avoid being around people who are sick. This medicine may increase your risk to bruise or bleed. Call your doctor or health care professional if you notice any unusual bleeding. Be careful brushing and flossing your teeth or using a toothpick because you may get an infection or bleed more easily. If you have any dental work done, tell your dentist you are receiving this medicine. Avoid taking products that contain aspirin, acetaminophen, ibuprofen, naproxen, or ketoprofen unless instructed by your doctor. These medicines may hide a fever. Call your doctor or health care professional if you get diarrhea or mouth sores. Do not treat yourself. To protect your kidneys, drink water or other fluids as directed while you are taking this medicine. Do not become pregnant while taking this medicine or for 6 months after stopping it. Women should inform their doctor if they wish to become pregnant or think they might be pregnant. Men should not father a child while taking this medicine and for 3 months after stopping it. This may interfere with the ability to father a child. You should talk to  your doctor or health care professional if you are concerned about your fertility. There is a potential for serious side effects to an unborn child. Talk to your health care professional or pharmacist for more information. Do not breast-feed an infant while taking this medicine or for 1 week after stopping it. What side effects may I notice from receiving this medicine? Side effects that you should report to your doctor or health care professional as soon as possible:  allergic reactions like skin rash, itching or hives, swelling of the face, lips, or tongue  breathing problems  redness, blistering, peeling or loosening of the skin, including inside the mouth  signs and symptoms of bleeding such as bloody or black, tarry stools; red or dark-brown urine; spitting up blood or brown material that looks like coffee grounds; red spots on the skin; unusual bruising or bleeding from the eye, gums, or nose  signs and symptoms of infection like fever or chills; cough;  sore throat; pain or trouble passing urine  signs and symptoms of kidney injury like trouble passing urine or change in the amount of urine  signs and symptoms of liver injury like dark yellow or brown urine; general ill feeling or flu-like symptoms; light-colored stools; loss of appetite; nausea; right upper belly pain; unusually weak or tired; yellowing of the eyes or skin Side effects that usually do not require medical attention (report to your doctor or health care professional if they continue or are bothersome):  constipation  mouth sores  nausea, vomiting  unusually weak or tired This list may not describe all possible side effects. Call your doctor for medical advice about side effects. You may report side effects to FDA at 1-800-FDA-1088. Where should I keep my medicine? This drug is given in a hospital or clinic and will not be stored at home. NOTE: This sheet is a summary. It may not cover all possible information. If  you have questions about this medicine, talk to your doctor, pharmacist, or health care provider.  2020 Elsevier/Gold Standard (2017-06-03 16:11:33)  Carboplatin injection What is this medicine? CARBOPLATIN (KAR boe pla tin) is a chemotherapy drug. It targets fast dividing cells, like cancer cells, and causes these cells to die. This medicine is used to treat ovarian cancer and many other cancers. This medicine may be used for other purposes; ask your health care provider or pharmacist if you have questions. COMMON BRAND NAME(S): Paraplatin What should I tell my health care provider before I take this medicine? They need to know if you have any of these conditions:  blood disorders  hearing problems  kidney disease  recent or ongoing radiation therapy  an unusual or allergic reaction to carboplatin, cisplatin, other chemotherapy, other medicines, foods, dyes, or preservatives  pregnant or trying to get pregnant  breast-feeding How should I use this medicine? This drug is usually given as an infusion into a vein. It is administered in a hospital or clinic by a specially trained health care professional. Talk to your pediatrician regarding the use of this medicine in children. Special care may be needed. Overdosage: If you think you have taken too much of this medicine contact a poison control center or emergency room at once. NOTE: This medicine is only for you. Do not share this medicine with others. What if I miss a dose? It is important not to miss a dose. Call your doctor or health care professional if you are unable to keep an appointment. What may interact with this medicine?  medicines for seizures  medicines to increase blood counts like filgrastim, pegfilgrastim, sargramostim  some antibiotics like amikacin, gentamicin, neomycin, streptomycin, tobramycin  vaccines Talk to your doctor or health care professional before taking any of these  medicines:  acetaminophen  aspirin  ibuprofen  ketoprofen  naproxen This list may not describe all possible interactions. Give your health care provider a list of all the medicines, herbs, non-prescription drugs, or dietary supplements you use. Also tell them if you smoke, drink alcohol, or use illegal drugs. Some items may interact with your medicine. What should I watch for while using this medicine? Your condition will be monitored carefully while you are receiving this medicine. You will need important blood work done while you are taking this medicine. This drug may make you feel generally unwell. This is not uncommon, as chemotherapy can affect healthy cells as well as cancer cells. Report any side effects. Continue your course of treatment even though you  feel ill unless your doctor tells you to stop. In some cases, you may be given additional medicines to help with side effects. Follow all directions for their use. Call your doctor or health care professional for advice if you get a fever, chills or sore throat, or other symptoms of a cold or flu. Do not treat yourself. This drug decreases your body's ability to fight infections. Try to avoid being around people who are sick. This medicine may increase your risk to bruise or bleed. Call your doctor or health care professional if you notice any unusual bleeding. Be careful brushing and flossing your teeth or using a toothpick because you may get an infection or bleed more easily. If you have any dental work done, tell your dentist you are receiving this medicine. Avoid taking products that contain aspirin, acetaminophen, ibuprofen, naproxen, or ketoprofen unless instructed by your doctor. These medicines may hide a fever. Do not become pregnant while taking this medicine. Women should inform their doctor if they wish to become pregnant or think they might be pregnant. There is a potential for serious side effects to an unborn child. Talk  to your health care professional or pharmacist for more information. Do not breast-feed an infant while taking this medicine. What side effects may I notice from receiving this medicine? Side effects that you should report to your doctor or health care professional as soon as possible:  allergic reactions like skin rash, itching or hives, swelling of the face, lips, or tongue  signs of infection - fever or chills, cough, sore throat, pain or difficulty passing urine  signs of decreased platelets or bleeding - bruising, pinpoint red spots on the skin, black, tarry stools, nosebleeds  signs of decreased red blood cells - unusually weak or tired, fainting spells, lightheadedness  breathing problems  changes in hearing  changes in vision  chest pain  high blood pressure  low blood counts - This drug may decrease the number of white blood cells, red blood cells and platelets. You may be at increased risk for infections and bleeding.  nausea and vomiting  pain, swelling, redness or irritation at the injection site  pain, tingling, numbness in the hands or feet  problems with balance, talking, walking  trouble passing urine or change in the amount of urine Side effects that usually do not require medical attention (report to your doctor or health care professional if they continue or are bothersome):  hair loss  loss of appetite  metallic taste in the mouth or changes in taste This list may not describe all possible side effects. Call your doctor for medical advice about side effects. You may report side effects to FDA at 1-800-FDA-1088. Where should I keep my medicine? This drug is given in a hospital or clinic and will not be stored at home. NOTE: This sheet is a summary. It may not cover all possible information. If you have questions about this medicine, talk to your doctor, pharmacist, or health care provider.  2020 Elsevier/Gold Standard (2007-07-20 14:38:05)

## 2019-09-27 NOTE — Progress Notes (Signed)
[place] [phone]   Fax:(336) [phone]  OFFICE PROGRESS NOTE  Patient, No Pcp Per No address on file  DIAGNOSIS: Metastatic non-small cell lung cancer initially diagnosed as stage IIIA (T3, N1/N2, M0) non-small cell lung cancer, adenocarcinoma presented with large right lower lobe lung mass with extension to the right hilum and subcarinal area diagnosed in July 2019.  She has brain metastasis in October 2020.  Biomarker Findings Tumor Mutational Burden - TMB-Intermediate (6 Muts/Mb) Microsatellite status - MS-Stable Genomic Findings For a complete list of the genes assayed, please refer to the Appendix. NRAS Q61R ARAF amplification STK11 G56W KRAS G13D MYCN amplification MCL1 amplification NKX2-1 amplification - equivocal? TP53 G245V 7 Disease relevant genes with no reportable alterations: EGFR, ALK, BRAF, MET, ERBB2, RET, ROS1   PRIOR THERAPY:  1) Course of concurrent chemoradiation with weekly carboplatin for AUC of 2 and paclitaxel 45 mg/M2.  Status post 7 cycles.  Last dose was giving 01/11/2018. 2) Consolidation treatment with immunotherapy with Imfinzi (Durvalumab) 10 mg/KG every 2 weeks.  First dose February 09, 2018.  Status post 19 cycles. 3) status post stereotactic body radiotherapy to the enlarging right supraclavicular lymphadenopathy under the care of Dr. Lisbeth Renshaw. 4) SRS to multiple brain metastasis under the care of Dr. Lisbeth Renshaw.  CURRENT THERAPY: Systemic chemotherapy with carboplatin for AUC of 5, Alimta 500 mg/M2 and Keytruda 200 mg IV every 3 weeks.  First dose 08/16/2019.  Status post 2 cycles.  INTERVAL HISTORY: Susan Holder 64 y.o. female returns to the clinic today for follow-up visit.  The patient is feeling fine today with no concerning complaints except for mild itching in the chest area.  She also has mild nausea improved with Compazine.  She tolerated the second cycle of her treatment much better.  She denied having  any current chest pain, shortness of breath, cough or hemoptysis.  She denied having any fever or chills.  She has no diarrhea or constipation.  She is here today for evaluation before starting cycle #3 of her treatment.  MEDICAL HISTORY: Past Medical History:  Diagnosis Date  . met lung ca dx'd 09/2017   neck LN and brain 2020  . Rheumatoid arthritis ([place])     ALLERGIES:  has No Known Allergies.  MEDICATIONS:  Current Outpatient Medications  Medication Sig Dispense Refill  . aspirin EC 81 MG tablet Take 81 mg by mouth daily.    . diclofenac sodium (VOLTAREN) 1 % GEL Apply 2 g topically 4 (four) times daily.    . folic acid (FOLVITE) 1 MG tablet Take 1 tablet (1 mg total) by mouth daily. 30 tablet 4  . hydroxychloroquine (PLAQUENIL) 200 MG tablet TAKE 1 TABLET BY MOUTH TWICE DAILY MONDAY-FRIDAY 120 tablet 0  . naproxen sodium (ALEVE) 220 MG tablet Take 220 mg by mouth daily as needed (PAIN).    Marland Kitchen omeprazole (PRILOSEC) 20 MG capsule Take 20 mg by mouth daily.    Marland Kitchen Phenylephrine-DM-GG-APAP (DELSYM COUGH/COLD DAYTIME PO) Take by mouth as needed.     . prochlorperazine (COMPAZINE) 10 MG tablet Take 1 tablet (10 mg total) by mouth every 6 (six) hours as needed for nausea or vomiting. 30 tablet 0  . pseudoephedrine-guaifenesin (MUCINEX D) 60-600 MG 12 hr tablet Take 1 tablet by mouth as needed.     . umeclidinium-vilanterol (ANORO ELLIPTA) 62.5-25 MCG/INH AEPB Inhale 1 puff into the lungs daily. 1 each 0  . VITAMIN D PO Take by mouth.  No current facility-administered medications for this visit.    SURGICAL HISTORY:  Past Surgical History:  Procedure Laterality Date  . BACK SURGERY    . BRONCHIAL NEEDLE ASPIRATION BIOPSY  11/09/2017   Procedure: BRONCHIAL NEEDLE ASPIRATION BIOPSIES;  Surgeon: Marshell Garfinkel, MD;  Location: [facility];  Service: Cardiopulmonary;;  . ENDOBRONCHIAL ULTRASOUND Bilateral 11/09/2017   Procedure: ENDOBRONCHIAL ULTRASOUND;  Surgeon: Marshell Garfinkel, MD;   Location: [facility];  Service: Cardiopulmonary;  Laterality: Bilateral;  . NASAL SINUS SURGERY    . NASAL SINUS SURGERY    . TUBAL LIGATION    . VIDEO BRONCHOSCOPY  11/09/2017   Procedure: VIDEO BRONCHOSCOPY;  Surgeon: Marshell Garfinkel, MD;  Location: [facility];  Service: Cardiopulmonary;;    REVIEW OF SYSTEMS:  A comprehensive review of systems was negative except for: Gastrointestinal: positive for nausea Integument/breast: positive for pruritus   PHYSICAL EXAMINATION: General appearance: alert, cooperative and no distress Head: Normocephalic, without obvious abnormality, atraumatic Neck: no adenopathy, no JVD, supple, symmetrical, trachea midline and thyroid not enlarged, symmetric, no tenderness/mass/nodules Lymph nodes: Cervical, supraclavicular, and axillary nodes normal. Resp: clear to auscultation bilaterally Back: symmetric, no curvature. ROM normal. No CVA tenderness. Cardio: regular rate and rhythm, S1, S2 normal, no murmur, click, rub or gallop GI: soft, non-tender; bowel sounds normal; no masses,  no organomegaly Extremities: extremities normal, atraumatic, no cyanosis or edema  ECOG PERFORMANCE STATUS: 1 - Symptomatic but completely ambulatory  Blood pressure 121/69, pulse (!) 102, temperature (!) 97.5 F (36.4 C), resp. rate 20, height 5' (1.524 m), weight 149 lb 1.6 oz (67.6 kg), last menstrual period 04/28/2000, SpO2 100 %.  LABORATORY DATA: Lab Results  Component Value Date   WBC 3.2 (L) 09/20/2019   HGB 11.7 (L) 09/20/2019   HCT 35.5 (L) 09/20/2019   MCV 96.5 09/20/2019   PLT 203 09/20/2019      Chemistry      Component Value Date/Time   NA 138 09/20/2019 1145   K 3.8 09/20/2019 1145   CL 106 09/20/2019 1145   CO2 24 09/20/2019 1145   BUN 11 09/20/2019 1145   CREATININE 0.91 09/20/2019 1145   CREATININE 0.80 07/24/2017 1438      Component Value Date/Time   CALCIUM 9.5 09/20/2019 1145   ALKPHOS 112 09/20/2019 1145   AST 35 09/20/2019 1145   ALT  30 09/20/2019 1145   BILITOT 0.2 (L) 09/20/2019 1145       RADIOGRAPHIC STUDIES: MR Brain W Wo Contrast  Result Date: 09/07/2019 CLINICAL DATA:  Follow-up brain metastasis.  Lung cancer. EXAM: MRI HEAD WITHOUT AND WITH CONTRAST TECHNIQUE: Multiplanar, multiecho pulse sequences of the brain and surrounding structures were obtained without and with intravenous contrast. CONTRAST:  80m MULTIHANCE GADOBENATE DIMEGLUMINE 529 MG/ML IV SOLN COMPARISON:  MRI head 06/09/2019, 02/23/2019 FINDINGS: Brain: Multiple metastatic deposit brain have been previously treated and are all improved. 2 lesions in the left cerebellum are smaller with less edema. Each of these lesions measures approximately 5 mm in diameter. 3 mm enhancing lesion right frontal lobe anterior to the middle cerebral artery unchanged. Other lesions seen on prior studies have resolved completely. Negative for acute infarct. Patchy small white matter hyperintensities consistent with chronic microvascular ischemia. Small amount of hemorrhage in the left lateral cerebellar metastatic deposit. No other areas of hemorrhage. Ventricle size normal. No midline shift. Vascular: Normal arterial flow voids Skull and upper cervical spine: No skeletal lesions. Sinuses/Orbits: Negative Other: None IMPRESSION: Continued improvement in treated metastatic disease to the brain.  All lesions are smaller. No new or recurrent metastatic disease. Electronically Signed   By: Franchot Gallo M.D.   On: 09/07/2019 15:01    ASSESSMENT AND PLAN: This is a very pleasant 64 years old African-American female with metastatic non-small cell lung cancer initially diagnosed with a stage IIIA non-small cell lung cancer, adenocarcinoma.  She underwent a course of concurrent chemoradiation with weekly carboplatin and paclitaxel status post 7 cycles with partial response.   The patient tolerated this course of treatment well except for mild odynophagia and dysphagia. She completed on  consolidation treatment with immunotherapy with Imfinzi (Durvalumab) status post 18 cycles. She also completed SBRT to the right supraclavicular lymphadenopathy. The patient had evidence for multiple brain metastasis in October 2020 and she underwent SRS treatment to this lesion under the care of Dr. Lisbeth Renshaw. The patient had evidence for disease progression and she started systemic chemotherapy with carboplatin, Alimta and Keytruda status post 2 cycles. She tolerated the second cycle of her treatment much better. I recommended for her to proceed with cycle #3 today as planned. I will see her back for follow-up visit in 3 weeks for evaluation with repeat CT scan of the chest, abdomen pelvis for restaging of her disease. For the nausea she will continue on Compazine as needed basis. For the itching she was advised to take Benadryl as needed. The patient was advised to call immediately if she has any concerning symptoms in the interval. The patient voices understanding of current disease status and treatment options and is in agreement with the current care plan. All questions were answered. The patient knows to call the clinic with any problems, questions or concerns. We can certainly see the patient much sooner if necessary.  Disclaimer: This note was dictated with voice recognition software. Similar sounding words can inadvertently be transcribed and may not be corrected upon review.

## 2019-09-27 NOTE — Progress Notes (Signed)
Ok to treat w/ today's vital signs per Dr. Julien Nordmann

## 2019-09-29 ENCOUNTER — Telehealth: Payer: Self-pay | Admitting: Internal Medicine

## 2019-09-29 NOTE — Telephone Encounter (Signed)
Added additional appts per 6/1 los - pt to get an updated schedule next visit. [facility] radiology to contact pt for scan appt.

## 2019-10-03 ENCOUNTER — Encounter: Payer: Self-pay | Admitting: General Practice

## 2019-10-03 NOTE — Progress Notes (Signed)
[facility] Spiritual Care Note  Reached Susan Holder by phone for pastoral follow-up, but she was busy with her granddaughter. We plan to try to connect by phone later in the week.   [place], [place], [facility] Pager [phone] Voicemail [phone]

## 2019-10-04 ENCOUNTER — Other Ambulatory Visit: Payer: Self-pay

## 2019-10-04 ENCOUNTER — Inpatient Hospital Stay: Payer: Medicare PPO

## 2019-10-04 DIAGNOSIS — C3491 Malignant neoplasm of unspecified part of right bronchus or lung: Secondary | ICD-10-CM

## 2019-10-04 DIAGNOSIS — Z5112 Encounter for antineoplastic immunotherapy: Secondary | ICD-10-CM | POA: Diagnosis not present

## 2019-10-04 LAB — CBC WITH DIFFERENTIAL (CANCER CENTER ONLY)
Abs Immature Granulocytes: 0.02 10*3/uL (ref 0.00–0.07)
Basophils Absolute: 0 10*3/uL (ref 0.0–0.1)
Basophils Relative: 1 %
Eosinophils Absolute: 0 10*3/uL (ref 0.0–0.5)
Eosinophils Relative: 0 %
HCT: 34.3 % — ABNORMAL LOW (ref 36.0–46.0)
Hemoglobin: 11.2 g/dL — ABNORMAL LOW (ref 12.0–15.0)
Immature Granulocytes: 1 %
Lymphocytes Relative: 49 %
Lymphs Abs: 0.9 10*3/uL (ref 0.7–4.0)
MCH: 32 pg (ref 26.0–34.0)
MCHC: 32.7 g/dL (ref 30.0–36.0)
MCV: 98 fL (ref 80.0–100.0)
Monocytes Absolute: 0.3 10*3/uL (ref 0.1–1.0)
Monocytes Relative: 16 %
Neutro Abs: 0.6 10*3/uL — ABNORMAL LOW (ref 1.7–7.7)
Neutrophils Relative %: 33 %
Platelet Count: 191 10*3/uL (ref 150–400)
RBC: 3.5 MIL/uL — ABNORMAL LOW (ref 3.87–5.11)
RDW: 13.9 % (ref 11.5–15.5)
WBC Count: 1.9 10*3/uL — ABNORMAL LOW (ref 4.0–10.5)
nRBC: 0 % (ref 0.0–0.2)

## 2019-10-04 LAB — CMP (CANCER CENTER ONLY)
ALT: 21 U/L (ref 0–44)
AST: 32 U/L (ref 15–41)
Albumin: 3.7 g/dL (ref 3.5–5.0)
Alkaline Phosphatase: 102 U/L (ref 38–126)
Anion gap: 11 (ref 5–15)
BUN: 12 mg/dL (ref 8–23)
CO2: 26 mmol/L (ref 22–32)
Calcium: 10 mg/dL (ref 8.9–10.3)
Chloride: 103 mmol/L (ref 98–111)
Creatinine: 1.02 mg/dL — ABNORMAL HIGH (ref 0.44–1.00)
GFR, Est AFR Am: >60 mL/min (ref >60)
GFR, Estimated: 57 mL/min — ABNORMAL LOW (ref >60)
Glucose, Bld: 86 mg/dL (ref 70–99)
Potassium: 4.1 mmol/L (ref 3.5–5.1)
Sodium: 140 mmol/L (ref 135–145)
Total Bilirubin: 0.3 mg/dL (ref 0.3–1.2)
Total Protein: 7.5 g/dL (ref 6.5–8.1)

## 2019-10-05 ENCOUNTER — Encounter: Payer: Self-pay | Admitting: General Practice

## 2019-10-05 NOTE — Patient Instructions (Signed)
[facility] Spiritual Care Note  Followed up with Ms Susan Holder by phone as requested. Today was an "okay day" with not much to discuss. She has my number and knows to reach out anytime needed/desired.   [place], [place], [facility] Pager [phone] Voicemail [phone]

## 2019-10-11 ENCOUNTER — Other Ambulatory Visit: Payer: Self-pay

## 2019-10-11 ENCOUNTER — Inpatient Hospital Stay: Payer: Medicare PPO

## 2019-10-11 DIAGNOSIS — C3491 Malignant neoplasm of unspecified part of right bronchus or lung: Secondary | ICD-10-CM

## 2019-10-11 DIAGNOSIS — Z5112 Encounter for antineoplastic immunotherapy: Secondary | ICD-10-CM | POA: Diagnosis not present

## 2019-10-11 LAB — CBC WITH DIFFERENTIAL (CANCER CENTER ONLY)
Abs Immature Granulocytes: 0.01 10*3/uL (ref 0.00–0.07)
Basophils Absolute: 0 10*3/uL (ref 0.0–0.1)
Basophils Relative: 0 %
Eosinophils Absolute: 0 10*3/uL (ref 0.0–0.5)
Eosinophils Relative: 0 %
HCT: 31 % — ABNORMAL LOW (ref 36.0–46.0)
Hemoglobin: 10.2 g/dL — ABNORMAL LOW (ref 12.0–15.0)
Immature Granulocytes: 0 %
Lymphocytes Relative: 28 %
Lymphs Abs: 0.9 10*3/uL (ref 0.7–4.0)
MCH: 31.5 pg (ref 26.0–34.0)
MCHC: 32.9 g/dL (ref 30.0–36.0)
MCV: 95.7 fL (ref 80.0–100.0)
Monocytes Absolute: 0.8 10*3/uL (ref 0.1–1.0)
Monocytes Relative: 23 %
Neutro Abs: 1.6 10*3/uL — ABNORMAL LOW (ref 1.7–7.7)
Neutrophils Relative %: 49 %
Platelet Count: 149 10*3/uL — ABNORMAL LOW (ref 150–400)
RBC: 3.24 MIL/uL — ABNORMAL LOW (ref 3.87–5.11)
RDW: 14.2 % (ref 11.5–15.5)
WBC Count: 3.4 10*3/uL — ABNORMAL LOW (ref 4.0–10.5)
nRBC: 0 % (ref 0.0–0.2)

## 2019-10-11 LAB — CMP (CANCER CENTER ONLY)
ALT: 23 U/L (ref 0–44)
AST: 32 U/L (ref 15–41)
Albumin: 3.8 g/dL (ref 3.5–5.0)
Alkaline Phosphatase: 100 U/L (ref 38–126)
Anion gap: 10 (ref 5–15)
BUN: 7 mg/dL — ABNORMAL LOW (ref 8–23)
CO2: 25 mmol/L (ref 22–32)
Calcium: 9.8 mg/dL (ref 8.9–10.3)
Chloride: 105 mmol/L (ref 98–111)
Creatinine: 0.85 mg/dL (ref 0.44–1.00)
GFR, Est AFR Am: >60 mL/min (ref >60)
GFR, Estimated: >60 mL/min (ref >60)
Glucose, Bld: 86 mg/dL (ref 70–99)
Potassium: 4.3 mmol/L (ref 3.5–5.1)
Sodium: 140 mmol/L (ref 135–145)
Total Bilirubin: 0.3 mg/dL (ref 0.3–1.2)
Total Protein: 7.2 g/dL (ref 6.5–8.1)

## 2019-10-14 ENCOUNTER — Other Ambulatory Visit: Payer: Self-pay

## 2019-10-14 ENCOUNTER — Ambulatory Visit (HOSPITAL_COMMUNITY)
Admission: RE | Admit: 2019-10-14 | Discharge: 2019-10-14 | Disposition: A | Discharge status: Home / Self Care | Payer: Medicare PPO | Source: Ambulatory Visit | Attending: Internal Medicine | Admitting: Internal Medicine

## 2019-10-14 DIAGNOSIS — C3490 Malignant neoplasm of unspecified part of unspecified bronchus or lung: Secondary | ICD-10-CM

## 2019-10-14 MED ORDER — SODIUM CHLORIDE (PF) 0.9 % IJ SOLN
INTRAMUSCULAR | Status: AC
  Filled 2019-10-14: qty 50

## 2019-10-14 MED ORDER — IOHEXOL 300 MG/ML  SOLN
100.0000 mL | Freq: Once | INTRAMUSCULAR | Status: AC
  Administered 2019-10-14: 100 mL via INTRAVENOUS

## 2019-10-18 ENCOUNTER — Other Ambulatory Visit: Payer: Self-pay

## 2019-10-18 ENCOUNTER — Inpatient Hospital Stay: Payer: Medicare PPO | Admitting: Internal Medicine

## 2019-10-18 ENCOUNTER — Inpatient Hospital Stay: Payer: Medicare PPO

## 2019-10-18 ENCOUNTER — Encounter: Payer: Self-pay | Admitting: Internal Medicine

## 2019-10-18 VITALS — BP 132/69 | HR 115 | Temp 97.5°F | Resp 20 | Ht 60.0 in | Wt 145.7 lb

## 2019-10-18 VITALS — HR 98

## 2019-10-18 DIAGNOSIS — C3491 Malignant neoplasm of unspecified part of right bronchus or lung: Secondary | ICD-10-CM

## 2019-10-18 DIAGNOSIS — C3431 Malignant neoplasm of lower lobe, right bronchus or lung: Secondary | ICD-10-CM | POA: Diagnosis not present

## 2019-10-18 DIAGNOSIS — C7931 Secondary malignant neoplasm of brain: Secondary | ICD-10-CM

## 2019-10-18 DIAGNOSIS — Z5112 Encounter for antineoplastic immunotherapy: Secondary | ICD-10-CM

## 2019-10-18 DIAGNOSIS — Z5111 Encounter for antineoplastic chemotherapy: Secondary | ICD-10-CM | POA: Diagnosis not present

## 2019-10-18 LAB — CMP (CANCER CENTER ONLY)
ALT: 24 U/L (ref 0–44)
AST: 34 U/L (ref 15–41)
Albumin: 3.9 g/dL (ref 3.5–5.0)
Alkaline Phosphatase: 96 U/L (ref 38–126)
Anion gap: 11 (ref 5–15)
BUN: 6 mg/dL — ABNORMAL LOW (ref 8–23)
CO2: 22 mmol/L (ref 22–32)
Calcium: 9.7 mg/dL (ref 8.9–10.3)
Chloride: 106 mmol/L (ref 98–111)
Creatinine: 1.06 mg/dL — ABNORMAL HIGH (ref 0.44–1.00)
GFR, Est AFR Am: >60 mL/min (ref >60)
GFR, Estimated: 55 mL/min — ABNORMAL LOW (ref >60)
Glucose, Bld: 92 mg/dL (ref 70–99)
Potassium: 4 mmol/L (ref 3.5–5.1)
Sodium: 139 mmol/L (ref 135–145)
Total Bilirubin: 0.3 mg/dL (ref 0.3–1.2)
Total Protein: 7.6 g/dL (ref 6.5–8.1)

## 2019-10-18 LAB — CBC WITH DIFFERENTIAL (CANCER CENTER ONLY)
Abs Immature Granulocytes: 0 10*3/uL (ref 0.00–0.07)
Basophils Absolute: 0 10*3/uL (ref 0.0–0.1)
Basophils Relative: 0 %
Eosinophils Absolute: 0 10*3/uL (ref 0.0–0.5)
Eosinophils Relative: 0 %
HCT: 32.5 % — ABNORMAL LOW (ref 36.0–46.0)
Hemoglobin: 10.7 g/dL — ABNORMAL LOW (ref 12.0–15.0)
Immature Granulocytes: 0 %
Lymphocytes Relative: 22 %
Lymphs Abs: 0.8 10*3/uL (ref 0.7–4.0)
MCH: 32.1 pg (ref 26.0–34.0)
MCHC: 32.9 g/dL (ref 30.0–36.0)
MCV: 97.6 fL (ref 80.0–100.0)
Monocytes Absolute: 0.9 10*3/uL (ref 0.1–1.0)
Monocytes Relative: 25 %
Neutro Abs: 1.8 10*3/uL (ref 1.7–7.7)
Neutrophils Relative %: 53 %
Platelet Count: 255 10*3/uL (ref 150–400)
RBC: 3.33 MIL/uL — ABNORMAL LOW (ref 3.87–5.11)
RDW: 15.5 % (ref 11.5–15.5)
WBC Count: 3.4 10*3/uL — ABNORMAL LOW (ref 4.0–10.5)
nRBC: 0 % (ref 0.0–0.2)

## 2019-10-18 LAB — TSH: TSH: 3.07 u[IU]/mL (ref 0.308–3.960)

## 2019-10-18 MED ORDER — SODIUM CHLORIDE 0.9 % IV SOLN
200.0000 mg | Freq: Once | INTRAVENOUS | Status: AC
  Administered 2019-10-18: 200 mg via INTRAVENOUS
  Filled 2019-10-18: qty 8

## 2019-10-18 MED ORDER — SODIUM CHLORIDE 0.9 % IV SOLN: 400.5000 mg | Freq: Once | INTRAVENOUS | Status: DC

## 2019-10-18 MED ORDER — SODIUM CHLORIDE 0.9 % IV SOLN
10.0000 mg | Freq: Once | INTRAVENOUS | Status: AC
  Administered 2019-10-18: 10 mg via INTRAVENOUS
  Filled 2019-10-18: qty 10

## 2019-10-18 MED ORDER — SODIUM CHLORIDE 0.9 % IV SOLN
Freq: Once | INTRAVENOUS | Status: AC
  Filled 2019-10-18: qty 250

## 2019-10-18 MED ORDER — SODIUM CHLORIDE 0.9 % IV SOLN
150.0000 mg | Freq: Once | INTRAVENOUS | Status: AC
  Administered 2019-10-18: 150 mg via INTRAVENOUS
  Filled 2019-10-18: qty 150

## 2019-10-18 MED ORDER — SODIUM CHLORIDE 0.9 % IV SOLN
500.0000 mg/m2 | Freq: Once | INTRAVENOUS | Status: AC
  Administered 2019-10-18: 800 mg via INTRAVENOUS
  Filled 2019-10-18: qty 12

## 2019-10-18 MED ORDER — PALONOSETRON HCL INJECTION 0.25 MG/5ML
INTRAVENOUS | Status: AC
  Filled 2019-10-18: qty 5

## 2019-10-18 MED ORDER — SODIUM CHLORIDE 0.9 % IV SOLN
420.0000 mg | Freq: Once | INTRAVENOUS | Status: AC
  Administered 2019-10-18: 420 mg via INTRAVENOUS
  Filled 2019-10-18: qty 42

## 2019-10-18 MED ORDER — PALONOSETRON HCL INJECTION 0.25 MG/5ML
0.2500 mg | Freq: Once | INTRAVENOUS | Status: AC
  Administered 2019-10-18: 0.25 mg via INTRAVENOUS

## 2019-10-18 NOTE — Progress Notes (Signed)
[place] [phone]   Fax:(336) [phone]  OFFICE PROGRESS NOTE  Patient, No Pcp Per No address on file  DIAGNOSIS: Metastatic non-small cell lung cancer initially diagnosed as stage IIIA (T3, N1/N2, M0) non-small cell lung cancer, adenocarcinoma presented with large right lower lobe lung mass with extension to the right hilum and subcarinal area diagnosed in July 2019.  She has brain metastasis in October 2020.  Biomarker Findings Tumor Mutational Burden - TMB-Intermediate (6 Muts/Mb) Microsatellite status - MS-Stable Genomic Findings For a complete list of the genes assayed, please refer to the Appendix. NRAS Q61R ARAF amplification STK11 G56W KRAS G13D MYCN amplification MCL1 amplification NKX2-1 amplification - equivocal? TP53 G245V 7 Disease relevant genes with no reportable alterations: EGFR, ALK, BRAF, MET, ERBB2, RET, ROS1   PRIOR THERAPY:  1) Course of concurrent chemoradiation with weekly carboplatin for AUC of 2 and paclitaxel 45 mg/M2.  Status post 7 cycles.  Last dose was giving 01/11/2018. 2) Consolidation treatment with immunotherapy with Imfinzi (Durvalumab) 10 mg/KG every 2 weeks.  First dose February 09, 2018.  Status post 19 cycles. 3) status post stereotactic body radiotherapy to the enlarging right supraclavicular lymphadenopathy under the care of Dr. Lisbeth Renshaw. 4) SRS to multiple brain metastasis under the care of Dr. Lisbeth Renshaw.  CURRENT THERAPY: Systemic chemotherapy with carboplatin for AUC of 5, Alimta 500 mg/M2 and Keytruda 200 mg IV every 3 weeks.  First dose 08/16/2019.  Status post 3 cycles.  INTERVAL HISTORY: Susan Holder 64 y.o. female returns to the clinic today for follow-up visit.  The patient is feeling fine today with no concerning complaints.  She has been tolerating her treatment with chemotherapy fairly well except for few days of nausea after the treatment.  She denied having any current chest pain, shortness of  breath, cough or hemoptysis.  She denied having any fever or chills.  She has no nausea, vomiting, diarrhea or constipation.  She denied having any headache or visual changes.  She is here today for evaluation with repeat CT scan of the chest, abdomen pelvis for restaging of her disease.  MEDICAL HISTORY: Past Medical History:  Diagnosis Date  . met lung ca dx'd 09/2017   neck LN and brain 2020  . Rheumatoid arthritis ([place])     ALLERGIES:  has No Known Allergies.  MEDICATIONS:  Current Outpatient Medications  Medication Sig Dispense Refill  . aspirin EC 81 MG tablet Take 81 mg by mouth daily.    . diclofenac sodium (VOLTAREN) 1 % GEL Apply 2 g topically 4 (four) times daily.    . folic acid (FOLVITE) 1 MG tablet Take 1 tablet (1 mg total) by mouth daily. 30 tablet 4  . hydroxychloroquine (PLAQUENIL) 200 MG tablet TAKE 1 TABLET BY MOUTH TWICE DAILY MONDAY-FRIDAY 120 tablet 0  . naproxen sodium (ALEVE) 220 MG tablet Take 220 mg by mouth daily as needed (PAIN).    Marland Kitchen omeprazole (PRILOSEC) 20 MG capsule Take 20 mg by mouth daily.    Marland Kitchen Phenylephrine-DM-GG-APAP (DELSYM COUGH/COLD DAYTIME PO) Take by mouth as needed.     . prochlorperazine (COMPAZINE) 10 MG tablet Take 1 tablet (10 mg total) by mouth every 6 (six) hours as needed for nausea or vomiting. 30 tablet 0  . pseudoephedrine-guaifenesin (MUCINEX D) 60-600 MG 12 hr tablet Take 1 tablet by mouth as needed.     . umeclidinium-vilanterol (ANORO ELLIPTA) 62.5-25 MCG/INH AEPB Inhale 1 puff into the lungs daily. 1 each 0  .  VITAMIN D PO Take by mouth.     No current facility-administered medications for this visit.    SURGICAL HISTORY:  Past Surgical History:  Procedure Laterality Date  . BACK SURGERY    . BRONCHIAL NEEDLE ASPIRATION BIOPSY  11/09/2017   Procedure: BRONCHIAL NEEDLE ASPIRATION BIOPSIES;  Surgeon: Marshell Garfinkel, MD;  Location: [facility];  Service: Cardiopulmonary;;  . ENDOBRONCHIAL ULTRASOUND Bilateral 11/09/2017    Procedure: ENDOBRONCHIAL ULTRASOUND;  Surgeon: Marshell Garfinkel, MD;  Location: [facility];  Service: Cardiopulmonary;  Laterality: Bilateral;  . NASAL SINUS SURGERY    . NASAL SINUS SURGERY    . TUBAL LIGATION    . VIDEO BRONCHOSCOPY  11/09/2017   Procedure: VIDEO BRONCHOSCOPY;  Surgeon: Marshell Garfinkel, MD;  Location: [facility];  Service: Cardiopulmonary;;    REVIEW OF SYSTEMS:  Constitutional: positive for fatigue Eyes: negative Ears, nose, mouth, throat, and face: negative Respiratory: negative Cardiovascular: negative Gastrointestinal: negative Genitourinary:negative Integument/breast: negative Hematologic/lymphatic: negative Musculoskeletal:negative Neurological: negative Behavioral/Psych: negative Endocrine: negative Allergic/Immunologic: negative   PHYSICAL EXAMINATION: General appearance: alert, cooperative and no distress Head: Normocephalic, without obvious abnormality, atraumatic Neck: no adenopathy, no JVD, supple, symmetrical, trachea midline and thyroid not enlarged, symmetric, no tenderness/mass/nodules Lymph nodes: Cervical, supraclavicular, and axillary nodes normal. Resp: clear to auscultation bilaterally Back: symmetric, no curvature. ROM normal. No CVA tenderness. Cardio: regular rate and rhythm, S1, S2 normal, no murmur, click, rub or gallop GI: soft, non-tender; bowel sounds normal; no masses,  no organomegaly Extremities: extremities normal, atraumatic, no cyanosis or edema Neurologic: Alert and oriented X 3, normal strength and tone. Normal symmetric reflexes. Normal coordination and gait  ECOG PERFORMANCE STATUS: 1 - Symptomatic but completely ambulatory  Blood pressure 132/69, pulse (!) 115, temperature (!) 97.5 F (36.4 C), temperature source Temporal, resp. rate 20, height 5' (1.524 m), weight 145 lb 11.2 oz (66.1 kg), last menstrual period 04/28/2000, SpO2 100 %.  LABORATORY DATA: Lab Results  Component Value Date   WBC 3.4 (L) 10/18/2019    HGB 10.7 (L) 10/18/2019   HCT 32.5 (L) 10/18/2019   MCV 97.6 10/18/2019   PLT 255 10/18/2019      Chemistry      Component Value Date/Time   NA 139 10/18/2019 0953   K 4.0 10/18/2019 0953   CL 106 10/18/2019 0953   CO2 22 10/18/2019 0953   BUN 6 (L) 10/18/2019 0953   CREATININE 1.06 (H) 10/18/2019 0953   CREATININE 0.80 07/24/2017 1438      Component Value Date/Time   CALCIUM 9.7 10/18/2019 0953   ALKPHOS 96 10/18/2019 0953   AST 34 10/18/2019 0953   ALT 24 10/18/2019 0953   BILITOT 0.3 10/18/2019 0953       RADIOGRAPHIC STUDIES: CT Chest W Contrast  Result Date: 10/14/2019 CLINICAL DATA:  Primary Cancer Type: Lung Imaging Indication: Assess response to therapy Interval therapy since last imaging? Yes Initial Cancer Diagnosis Date: 11/12/2017; Established by: Biopsy-proven Detailed Pathology: Stage IIIA non-small cell lung cancer, adenocarcinoma. Primary Tumor location: Right lower lobe. Cancer Specific: Brain metastasis 01/2019. Chemotherapy: Yes; Ongoing? Yes; Most recent administration: 09/27/2019 Immunotherapy?  Yes; Type: Imfinzi; Ongoing? Yes Radiation therapy? Yes Date Range: 11/16/2018-12/20/2018; Target: Right supraclavicular nodes. Date Range: 11/30/2017-01/11/2018; Target: Right chest and regional nodes. EXAM: CT CHEST, ABDOMEN, AND PELVIS WITH CONTRAST TECHNIQUE: Multidetector CT imaging of the chest, abdomen and pelvis was performed following the standard protocol during bolus administration of intravenous contrast. CONTRAST:  151m OMNIPAQUE IOHEXOL 300 MG/ML  SOLN COMPARISON:  Most recent CT  chest 08/08/2019.  11/02/2018 PET-CT. FINDINGS: CT CHEST FINDINGS Cardiovascular: No significant vascular findings. Normal heart size. No pericardial effusion. Mediastinum/Nodes: No axillary or supraclavicular adenopathy no mediastinal adenopathy no pericardial Lungs/Pleura: Volume loss in the RIGHT hemithorax. Peri hilar consolidation and bronchiectasis typical of radiation change.  New nodularity. LEFT lung is hyperexpanded.  No suspicious pulmonary nodules. Musculoskeletal: No aggressive osseous lesion. CT ABDOMEN AND PELVIS FINDINGS Hepatobiliary: No focal hepatic lesion. No biliary ductal dilatation. Gallbladder is normal. Common bile duct is normal. Pancreas: Pancreas is normal. No ductal dilatation. No pancreatic inflammation. Spleen: Normal spleen Adrenals/urinary tract: Adrenal glands and kidneys are normal. The ureters and bladder normal. Stomach/Bowel: Stomach, small-bowel cecum normal. The colon and rectosigmoid colon are normal. Vascular/Lymphatic: Abdominal aorta is normal caliber with atherosclerotic calcification. There is no retroperitoneal or periportal lymphadenopathy. No pelvic lymphadenopathy. Reproductive: Uterus and adnexa unremarkable. Other: No free fluid. Musculoskeletal: No aggressive osseous lesion. IMPRESSION: Chest: 1. Postradiation change in the RIGHT hemithorax. 2. No evidence of thoracic metastasis. Abdomen / Pelvis. 1. No evidence of metastatic disease in the abdomen pelvis. 2. Aortic Atherosclerosis (ICD10-I70.0). Electronically Signed   By: Suzy Bouchard M.D.   On: 10/14/2019 11:30   CT Abdomen Pelvis W Contrast  Result Date: 10/14/2019 CLINICAL DATA:  Primary Cancer Type: Lung Imaging Indication: Assess response to therapy Interval therapy since last imaging? Yes Initial Cancer Diagnosis Date: 11/12/2017; Established by: Biopsy-proven Detailed Pathology: Stage IIIA non-small cell lung cancer, adenocarcinoma. Primary Tumor location: Right lower lobe. Cancer Specific: Brain metastasis 01/2019. Chemotherapy: Yes; Ongoing? Yes; Most recent administration: 09/27/2019 Immunotherapy?  Yes; Type: Imfinzi; Ongoing? Yes Radiation therapy? Yes Date Range: 11/16/2018-12/20/2018; Target: Right supraclavicular nodes. Date Range: 11/30/2017-01/11/2018; Target: Right chest and regional nodes. EXAM: CT CHEST, ABDOMEN, AND PELVIS WITH CONTRAST TECHNIQUE: Multidetector  CT imaging of the chest, abdomen and pelvis was performed following the standard protocol during bolus administration of intravenous contrast. CONTRAST:  157m OMNIPAQUE IOHEXOL 300 MG/ML  SOLN COMPARISON:  Most recent CT chest 08/08/2019.  11/02/2018 PET-CT. FINDINGS: CT CHEST FINDINGS Cardiovascular: No significant vascular findings. Normal heart size. No pericardial effusion. Mediastinum/Nodes: No axillary or supraclavicular adenopathy no mediastinal adenopathy no pericardial Lungs/Pleura: Volume loss in the RIGHT hemithorax. Peri hilar consolidation and bronchiectasis typical of radiation change. New nodularity. LEFT lung is hyperexpanded.  No suspicious pulmonary nodules. Musculoskeletal: No aggressive osseous lesion. CT ABDOMEN AND PELVIS FINDINGS Hepatobiliary: No focal hepatic lesion. No biliary ductal dilatation. Gallbladder is normal. Common bile duct is normal. Pancreas: Pancreas is normal. No ductal dilatation. No pancreatic inflammation. Spleen: Normal spleen Adrenals/urinary tract: Adrenal glands and kidneys are normal. The ureters and bladder normal. Stomach/Bowel: Stomach, small-bowel cecum normal. The colon and rectosigmoid colon are normal. Vascular/Lymphatic: Abdominal aorta is normal caliber with atherosclerotic calcification. There is no retroperitoneal or periportal lymphadenopathy. No pelvic lymphadenopathy. Reproductive: Uterus and adnexa unremarkable. Other: No free fluid. Musculoskeletal: No aggressive osseous lesion. IMPRESSION: Chest: 1. Postradiation change in the RIGHT hemithorax. 2. No evidence of thoracic metastasis. Abdomen / Pelvis. 1. No evidence of metastatic disease in the abdomen pelvis. 2. Aortic Atherosclerosis (ICD10-I70.0). Electronically Signed   By: SSuzy BouchardM.D.   On: 10/14/2019 11:30    ASSESSMENT AND PLAN: This is a very pleasant 64years old African-American female with metastatic non-small cell lung cancer initially diagnosed with a stage IIIA non-small  cell lung cancer, adenocarcinoma.  She underwent a course of concurrent chemoradiation with weekly carboplatin and paclitaxel status post 7 cycles with partial response.   The patient  tolerated this course of treatment well except for mild odynophagia and dysphagia. She completed on consolidation treatment with immunotherapy with Imfinzi (Durvalumab) status post 18 cycles. She also completed SBRT to the right supraclavicular lymphadenopathy. The patient had evidence for multiple brain metastasis in October 2020 and she underwent SRS treatment to this lesion under the care of Dr. Lisbeth Renshaw. The patient had evidence for disease progression and she started systemic chemotherapy with carboplatin, Alimta and Keytruda status post 3 cycles. The patient has been tolerating her treatment well with no concerning adverse effects. She had repeat CT scan of the chest, abdomen pelvis performed recently.  I personally and independently reviewed the scans and discussed the results with the patient today. Her scan showed no concerning findings for disease progression and there was actually improvement of her condition. I recommended for the patient to continue her current treatment and she will start cycle #4 with carboplatin, Alimta and Keytruda today. Starting from cycle #5 the patient will be treated with maintenance Alimta and Keytruda every 3 weeks. She would like to delay the start of cycle number 5 by 1 week until she returns back from [place].  I will schedule her next treatment to be done on November 15, 2019.  The patient will come back for follow-up visit at that time. She was advised to call immediately if she has any concerning symptoms in the interval. The patient voices understanding of current disease status and treatment options and is in agreement with the current care plan. All questions were answered. The patient knows to call the clinic with any problems, questions or concerns. We can certainly see the  patient much sooner if necessary.  Disclaimer: This note was dictated with voice recognition software. Similar sounding words can inadvertently be transcribed and may not be corrected upon review.

## 2019-10-18 NOTE — Patient Instructions (Signed)
[facility] Discharge Instructions for Patients Receiving Chemotherapy  Today you received the following chemotherapy agents: Keytruda, Alimta, Carboplatin   To help prevent nausea and vomiting after your treatment, we encourage you to take your nausea medication as directed.    If you develop nausea and vomiting that is not controlled by your nausea medication, call the clinic.   BELOW ARE SYMPTOMS THAT SHOULD BE REPORTED IMMEDIATELY:  *FEVER GREATER THAN 100.5 F  *CHILLS WITH OR WITHOUT FEVER  NAUSEA AND VOMITING THAT IS NOT CONTROLLED WITH YOUR NAUSEA MEDICATION  *UNUSUAL SHORTNESS OF BREATH  *UNUSUAL BRUISING OR BLEEDING  TENDERNESS IN MOUTH AND THROAT WITH OR WITHOUT PRESENCE OF ULCERS  *URINARY PROBLEMS  *BOWEL PROBLEMS  UNUSUAL RASH Items with * indicate a potential emergency and should be followed up as soon as possible.  Feel free to call the clinic should you have any questions or concerns. The clinic phone number is [phone].  Please show the [address] at check-in to the Emergency Department and triage nurse.

## 2019-10-20 ENCOUNTER — Other Ambulatory Visit: Payer: Self-pay | Admitting: *Deleted

## 2019-10-20 DIAGNOSIS — C7931 Secondary malignant neoplasm of brain: Secondary | ICD-10-CM

## 2019-10-21 ENCOUNTER — Other Ambulatory Visit: Payer: Self-pay | Admitting: *Deleted

## 2019-10-25 ENCOUNTER — Inpatient Hospital Stay: Payer: Medicare PPO

## 2019-10-25 ENCOUNTER — Other Ambulatory Visit: Payer: Self-pay

## 2019-10-25 DIAGNOSIS — C3491 Malignant neoplasm of unspecified part of right bronchus or lung: Secondary | ICD-10-CM

## 2019-10-25 DIAGNOSIS — Z5112 Encounter for antineoplastic immunotherapy: Secondary | ICD-10-CM | POA: Diagnosis not present

## 2019-10-25 LAB — CBC WITH DIFFERENTIAL (CANCER CENTER ONLY)
Abs Immature Granulocytes: 0 10*3/uL (ref 0.00–0.07)
Basophils Absolute: 0 10*3/uL (ref 0.0–0.1)
Basophils Relative: 1 %
Eosinophils Absolute: 0 10*3/uL (ref 0.0–0.5)
Eosinophils Relative: 1 %
HCT: 31.6 % — ABNORMAL LOW (ref 36.0–46.0)
Hemoglobin: 10.4 g/dL — ABNORMAL LOW (ref 12.0–15.0)
Immature Granulocytes: 0 %
Lymphocytes Relative: 48 %
Lymphs Abs: 0.7 10*3/uL (ref 0.7–4.0)
MCH: 32 pg (ref 26.0–34.0)
MCHC: 32.9 g/dL (ref 30.0–36.0)
MCV: 97.2 fL (ref 80.0–100.0)
Monocytes Absolute: 0.3 10*3/uL (ref 0.1–1.0)
Monocytes Relative: 17 %
Neutro Abs: 0.5 10*3/uL — ABNORMAL LOW (ref 1.7–7.7)
Neutrophils Relative %: 33 %
Platelet Count: 167 10*3/uL (ref 150–400)
RBC: 3.25 MIL/uL — ABNORMAL LOW (ref 3.87–5.11)
RDW: 15 % (ref 11.5–15.5)
WBC Count: 1.5 10*3/uL — ABNORMAL LOW (ref 4.0–10.5)
nRBC: 0 % (ref 0.0–0.2)

## 2019-10-25 LAB — CMP (CANCER CENTER ONLY)
ALT: 25 U/L (ref 0–44)
AST: 39 U/L (ref 15–41)
Albumin: 3.7 g/dL (ref 3.5–5.0)
Alkaline Phosphatase: 95 U/L (ref 38–126)
Anion gap: 9 (ref 5–15)
BUN: 11 mg/dL (ref 8–23)
CO2: 25 mmol/L (ref 22–32)
Calcium: 9.7 mg/dL (ref 8.9–10.3)
Chloride: 106 mmol/L (ref 98–111)
Creatinine: 0.83 mg/dL (ref 0.44–1.00)
GFR, Est AFR Am: >60 mL/min (ref >60)
GFR, Estimated: >60 mL/min (ref >60)
Glucose, Bld: 95 mg/dL (ref 70–99)
Potassium: 4.2 mmol/L (ref 3.5–5.1)
Sodium: 140 mmol/L (ref 135–145)
Total Bilirubin: 0.3 mg/dL (ref 0.3–1.2)
Total Protein: 7.3 g/dL (ref 6.5–8.1)

## 2019-11-01 ENCOUNTER — Other Ambulatory Visit: Payer: Self-pay

## 2019-11-01 ENCOUNTER — Inpatient Hospital Stay: Payer: Medicare PPO | Attending: Internal Medicine

## 2019-11-01 DIAGNOSIS — M069 Rheumatoid arthritis, unspecified: Secondary | ICD-10-CM | POA: Diagnosis not present

## 2019-11-01 DIAGNOSIS — Z5112 Encounter for antineoplastic immunotherapy: Secondary | ICD-10-CM | POA: Diagnosis present

## 2019-11-01 DIAGNOSIS — C3431 Malignant neoplasm of lower lobe, right bronchus or lung: Secondary | ICD-10-CM | POA: Insufficient documentation

## 2019-11-01 DIAGNOSIS — Z5111 Encounter for antineoplastic chemotherapy: Secondary | ICD-10-CM | POA: Insufficient documentation

## 2019-11-01 DIAGNOSIS — C7931 Secondary malignant neoplasm of brain: Secondary | ICD-10-CM | POA: Insufficient documentation

## 2019-11-01 DIAGNOSIS — Z923 Personal history of irradiation: Secondary | ICD-10-CM | POA: Insufficient documentation

## 2019-11-01 DIAGNOSIS — Z79899 Other long term (current) drug therapy: Secondary | ICD-10-CM | POA: Diagnosis not present

## 2019-11-01 DIAGNOSIS — C3491 Malignant neoplasm of unspecified part of right bronchus or lung: Secondary | ICD-10-CM

## 2019-11-01 DIAGNOSIS — R600 Localized edema: Secondary | ICD-10-CM | POA: Insufficient documentation

## 2019-11-01 LAB — CMP (CANCER CENTER ONLY)
ALT: 20 U/L (ref 0–44)
AST: 35 U/L (ref 15–41)
Albumin: 3.7 g/dL (ref 3.5–5.0)
Alkaline Phosphatase: 88 U/L (ref 38–126)
Anion gap: 10 (ref 5–15)
BUN: 8 mg/dL (ref 8–23)
CO2: 22 mmol/L (ref 22–32)
Calcium: 9.4 mg/dL (ref 8.9–10.3)
Chloride: 107 mmol/L (ref 98–111)
Creatinine: 0.93 mg/dL (ref 0.44–1.00)
GFR, Est AFR Am: >60 mL/min (ref >60)
GFR, Estimated: >60 mL/min (ref >60)
Glucose, Bld: 83 mg/dL (ref 70–99)
Potassium: 3.9 mmol/L (ref 3.5–5.1)
Sodium: 139 mmol/L (ref 135–145)
Total Bilirubin: <0.2 mg/dL — ABNORMAL LOW (ref 0.3–1.2)
Total Protein: 7.3 g/dL (ref 6.5–8.1)

## 2019-11-01 LAB — CBC WITH DIFFERENTIAL (CANCER CENTER ONLY)
Abs Immature Granulocytes: 0.01 10*3/uL (ref 0.00–0.07)
Basophils Absolute: 0 10*3/uL (ref 0.0–0.1)
Basophils Relative: 0 %
Eosinophils Absolute: 0 10*3/uL (ref 0.0–0.5)
Eosinophils Relative: 0 %
HCT: 27 % — ABNORMAL LOW (ref 36.0–46.0)
Hemoglobin: 8.9 g/dL — ABNORMAL LOW (ref 12.0–15.0)
Immature Granulocytes: 0 %
Lymphocytes Relative: 36 %
Lymphs Abs: 1 10*3/uL (ref 0.7–4.0)
MCH: 32.4 pg (ref 26.0–34.0)
MCHC: 33 g/dL (ref 30.0–36.0)
MCV: 98.2 fL (ref 80.0–100.0)
Monocytes Absolute: 0.6 10*3/uL (ref 0.1–1.0)
Monocytes Relative: 20 %
Neutro Abs: 1.2 10*3/uL — ABNORMAL LOW (ref 1.7–7.7)
Neutrophils Relative %: 44 %
Platelet Count: 83 10*3/uL — ABNORMAL LOW (ref 150–400)
RBC: 2.75 MIL/uL — ABNORMAL LOW (ref 3.87–5.11)
RDW: 15 % (ref 11.5–15.5)
WBC Count: 2.8 10*3/uL — ABNORMAL LOW (ref 4.0–10.5)
nRBC: 0 % (ref 0.0–0.2)

## 2019-11-02 NOTE — Progress Notes (Deleted)
Office Visit Note  Patient: Susan Holder             Date of Birth: 1955/12/19           MRN: [id number]             PCP: Patient, No Pcp Per Referring: No ref. provider found Visit Date: 11/15/2019 Occupation: @GUAROCC @  Subjective:  No chief complaint on file.   History of Present Illness: Susan Holder is a 64 y.o. female ***   Activities of Daily Living:  Patient reports morning stiffness for *** {minute/hour:19697}.   Patient {ACTIONS;DENIES/REPORTS:21021675::"Denies"} nocturnal pain.  Difficulty dressing/grooming: {ACTIONS;DENIES/REPORTS:21021675::"Denies"} Difficulty climbing stairs: {ACTIONS;DENIES/REPORTS:21021675::"Denies"} Difficulty getting out of chair: {ACTIONS;DENIES/REPORTS:21021675::"Denies"} Difficulty using hands for taps, buttons, cutlery, and/or writing: {ACTIONS;DENIES/REPORTS:21021675::"Denies"}  No Rheumatology ROS completed.   PMFS History:  Patient Active Problem List   Diagnosis Date Noted  . Brain metastases ([place]) 03/29/2019  . 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 Acid reflux 09/21/2018  . Hoarseness 03/16/2018  . Lung cancer ([place]) 03/16/2018  . Encounter for antineoplastic immunotherapy 02/01/2018  . Primary malignant neoplasm of bronchus of right lower lobe ([place]) 12/21/2017  . Adenocarcinoma of right lung, stage 3 ([place]) 11/17/2017  . Encounter for antineoplastic chemotherapy 11/17/2017  . Goals of care, counseling/discussion 11/17/2017  . Lung mass   . Left hand pain 03/06/2017  . Rheumatoid arthritis involving multiple sites with positive rheumatoid factor ([name]) 05/29/2016  . ANA positive 05/29/2016  . Vitamin D deficiency 05/29/2016  . High risk medication use 05/29/2016  . Trigger finger, left ring finger 05/29/2016  . Trigger finger, right ring finger 05/29/2016  . DDD (degenerative disc disease), cervical 05/29/2016  . Smoker 05/29/2016  . Other fatigue 05/29/2016  . Allergic rhinitis 08/22/2013      Past Medical History:  Diagnosis Date  . met lung ca dx'd 09/2017   neck LN and brain 2020  . Rheumatoid arthritis ([place])     Family History  Problem Relation Age of Onset  . Stroke Mother   . Alzheimer's disease Mother   . Heart disease Mother   . Emphysema Father   . Hypertension Brother   . Heart attack Maternal Aunt   . Heart failure Maternal Grandmother   . Hypertension Paternal Grandmother    Past Surgical History:  Procedure Laterality Date  . BACK SURGERY    . BRONCHIAL NEEDLE ASPIRATION BIOPSY  11/09/2017   Procedure: BRONCHIAL NEEDLE ASPIRATION BIOPSIES;  Surgeon: Marshell Garfinkel, MD;  Location: [facility];  Service: Cardiopulmonary;;  . ENDOBRONCHIAL ULTRASOUND Bilateral 11/09/2017   Procedure: ENDOBRONCHIAL ULTRASOUND;  Surgeon: Marshell Garfinkel, MD;  Location: [facility];  Service: Cardiopulmonary;  Laterality: Bilateral;  . NASAL SINUS SURGERY    . NASAL SINUS SURGERY    . TUBAL LIGATION    . VIDEO BRONCHOSCOPY  11/09/2017   Procedure: VIDEO BRONCHOSCOPY;  Surgeon: Marshell Garfinkel, MD;  Location: [facility];  Service: Cardiopulmonary;;   Social History   Social History Narrative  . Not on file   Immunization History  Administered Date(s) Administered  . Influenza, High Dose Seasonal PF 01/22/2019  . Influenza,inj,Quad PF,6+ Mos 03/08/2018     Objective: Vital Signs: LMP 04/28/2000    Physical Exam   Musculoskeletal Exam: ***  CDAI Exam: CDAI Score: -- Patient Global: --; Provider Global: -- Swollen: --; Tender: -- Joint Exam 11/15/2019   No joint exam has been documented for this visit   There is currently no information documented on the homunculus.  Go to the Rheumatology activity and complete the homunculus joint exam.  Investigation: No additional findings.  Imaging: CT Chest W Contrast  Result Date: 10/14/2019 CLINICAL DATA:  Primary Cancer Type: Lung Imaging Indication: Assess response to therapy Interval therapy since last imaging?  Yes Initial Cancer Diagnosis Date: 11/12/2017; Established by: Biopsy-proven Detailed Pathology: Stage IIIA non-small cell lung cancer, adenocarcinoma. Primary Tumor location: Right lower lobe. Cancer Specific: Brain metastasis 01/2019. Chemotherapy: Yes; Ongoing? Yes; Most recent administration: 09/27/2019 Immunotherapy?  Yes; Type: Imfinzi; Ongoing? Yes Radiation therapy? Yes Date Range: 11/16/2018-12/20/2018; Target: Right supraclavicular nodes. Date Range: 11/30/2017-01/11/2018; Target: Right chest and regional nodes. EXAM: CT CHEST, ABDOMEN, AND PELVIS WITH CONTRAST TECHNIQUE: Multidetector CT imaging of the chest, abdomen and pelvis was performed following the standard protocol during bolus administration of intravenous contrast. CONTRAST:  140m OMNIPAQUE IOHEXOL 300 MG/ML  SOLN COMPARISON:  Most recent CT chest 08/08/2019.  11/02/2018 PET-CT. FINDINGS: CT CHEST FINDINGS Cardiovascular: No significant vascular findings. Normal heart size. No pericardial effusion. Mediastinum/Nodes: No axillary or supraclavicular adenopathy no mediastinal adenopathy no pericardial Lungs/Pleura: Volume loss in the RIGHT hemithorax. Peri hilar consolidation and bronchiectasis typical of radiation change. New nodularity. LEFT lung is hyperexpanded.  No suspicious pulmonary nodules. Musculoskeletal: No aggressive osseous lesion. CT ABDOMEN AND PELVIS FINDINGS Hepatobiliary: No focal hepatic lesion. No biliary ductal dilatation. Gallbladder is normal. Common bile duct is normal. Pancreas: Pancreas is normal. No ductal dilatation. No pancreatic inflammation. Spleen: Normal spleen Adrenals/urinary tract: Adrenal glands and kidneys are normal. The ureters and bladder normal. Stomach/Bowel: Stomach, small-bowel cecum normal. The colon and rectosigmoid colon are normal. Vascular/Lymphatic: Abdominal aorta is normal caliber with atherosclerotic calcification. There is no retroperitoneal or periportal lymphadenopathy. No pelvic  lymphadenopathy. Reproductive: Uterus and adnexa unremarkable. Other: No free fluid. Musculoskeletal: No aggressive osseous lesion. IMPRESSION: Chest: 1. Postradiation change in the RIGHT hemithorax. 2. No evidence of thoracic metastasis. Abdomen / Pelvis. 1. No evidence of metastatic disease in the abdomen pelvis. 2. Aortic Atherosclerosis (ICD10-I70.0). Electronically Signed   By: SSuzy BouchardM.D.   On: 10/14/2019 11:30   CT Abdomen Pelvis W Contrast  Result Date: 10/14/2019 CLINICAL DATA:  Primary Cancer Type: Lung Imaging Indication: Assess response to therapy Interval therapy since last imaging? Yes Initial Cancer Diagnosis Date: 11/12/2017; Established by: Biopsy-proven Detailed Pathology: Stage IIIA non-small cell lung cancer, adenocarcinoma. Primary Tumor location: Right lower lobe. Cancer Specific: Brain metastasis 01/2019. Chemotherapy: Yes; Ongoing? Yes; Most recent administration: 09/27/2019 Immunotherapy?  Yes; Type: Imfinzi; Ongoing? Yes Radiation therapy? Yes Date Range: 11/16/2018-12/20/2018; Target: Right supraclavicular nodes. Date Range: 11/30/2017-01/11/2018; Target: Right chest and regional nodes. EXAM: CT CHEST, ABDOMEN, AND PELVIS WITH CONTRAST TECHNIQUE: Multidetector CT imaging of the chest, abdomen and pelvis was performed following the standard protocol during bolus administration of intravenous contrast. CONTRAST:  1032mOMNIPAQUE IOHEXOL 300 MG/ML  SOLN COMPARISON:  Most recent CT chest 08/08/2019.  11/02/2018 PET-CT. FINDINGS: CT CHEST FINDINGS Cardiovascular: No significant vascular findings. Normal heart size. No pericardial effusion. Mediastinum/Nodes: No axillary or supraclavicular adenopathy no mediastinal adenopathy no pericardial Lungs/Pleura: Volume loss in the RIGHT hemithorax. Peri hilar consolidation and bronchiectasis typical of radiation change. New nodularity. LEFT lung is hyperexpanded.  No suspicious pulmonary nodules. Musculoskeletal: No aggressive osseous  lesion. CT ABDOMEN AND PELVIS FINDINGS Hepatobiliary: No focal hepatic lesion. No biliary ductal dilatation. Gallbladder is normal. Common bile duct is normal. Pancreas: Pancreas is normal. No ductal dilatation. No pancreatic inflammation. Spleen: Normal spleen Adrenals/urinary tract: Adrenal glands and kidneys are normal. The ureters  and bladder normal. Stomach/Bowel: Stomach, small-bowel cecum normal. The colon and rectosigmoid colon are normal. Vascular/Lymphatic: Abdominal aorta is normal caliber with atherosclerotic calcification. There is no retroperitoneal or periportal lymphadenopathy. No pelvic lymphadenopathy. Reproductive: Uterus and adnexa unremarkable. Other: No free fluid. Musculoskeletal: No aggressive osseous lesion. IMPRESSION: Chest: 1. Postradiation change in the RIGHT hemithorax. 2. No evidence of thoracic metastasis. Abdomen / Pelvis. 1. No evidence of metastatic disease in the abdomen pelvis. 2. Aortic Atherosclerosis (ICD10-I70.0). Electronically Signed   By: Suzy Bouchard M.D.   On: 10/14/2019 11:30    Recent Labs: Lab Results  Component Value Date   WBC 2.8 (L) 11/01/2019   HGB 8.9 (L) 11/01/2019   PLT 83 (L) 11/01/2019   NA 139 11/01/2019   K 3.9 11/01/2019   CL 107 11/01/2019   CO2 22 11/01/2019   GLUCOSE 83 11/01/2019   BUN 8 11/01/2019   CREATININE 0.93 11/01/2019   BILITOT <0.2 (L) 11/01/2019   ALKPHOS 88 11/01/2019   AST 35 11/01/2019   ALT 20 11/01/2019   PROT 7.3 11/01/2019   ALBUMIN 3.7 11/01/2019   CALCIUM 9.4 11/01/2019   GFRAA >60 11/01/2019    Speciality Comments: PLQ eye exam: 02/03/2019 WNL @ [facility].  Procedures:  No procedures performed Allergies: Patient has no known allergies.   Assessment / Plan:     Visit Diagnoses: No diagnosis found.  Orders: No orders of the defined types were placed in this encounter.  No orders of the defined types were placed in this encounter.   Face-to-face time spent with patient was *** minutes.  Greater than 50% of time was spent in counseling and coordination of care.  Follow-Up Instructions: No follow-ups on file.   Ofilia Neas, PA-C  Note - This record has been created using Dragon software.  Chart creation errors have been sought, but may not always  have been located. Such creation errors do not reflect on  the standard of medical care.

## 2019-11-08 ENCOUNTER — Ambulatory Visit: Payer: Medicare PPO

## 2019-11-08 ENCOUNTER — Other Ambulatory Visit: Payer: Medicare PPO

## 2019-11-08 ENCOUNTER — Ambulatory Visit: Payer: Medicare PPO | Admitting: Internal Medicine

## 2019-11-09 NOTE — Progress Notes (Signed)
Pharmacist Chemotherapy Monitoring - Follow Up Assessment    I verify that I have reviewed each item in the below checklist:  . Regimen for the patient is scheduled for the appropriate day and plan matches scheduled date. Marland Kitchen Appropriate non-routine labs are ordered dependent on drug ordered. . If applicable, additional medications reviewed and ordered per protocol based on lifetime cumulative doses and/or treatment regimen.   Plan for follow-up and/or issues identified: No . I-vent associated with next due treatment: No . MD and/or nursing notified: No  Philomena Course 11/09/2019 8:19 AM

## 2019-11-10 ENCOUNTER — Ambulatory Visit: Payer: Medicare PPO | Admitting: Rheumatology

## 2019-11-12 NOTE — Progress Notes (Signed)
[facility] OFFICE PROGRESS NOTE  Patient, No Pcp Per No address on file  DIAGNOSIS: Metastatic non-small cell lung cancer initially diagnosed as stage IIIA (T3,N1/N2, M0)non-small cell lung cancer, adenocarcinoma presented with large right lower lobe lung mass with extension to the right hilum and subcarinal area diagnosed in July 2019.  She has brain metastasis in October 2020.  Biomarker Findings Tumor Mutational Burden - TMB-Intermediate (6 Muts/Mb) Microsatellite status - MS-Stable Genomic Findings For a complete list of the genes assayed, please refer to the Appendix. NRAS Q61R ARAF amplification STK11 G56W KRAS G13D MYCN amplification MCL1 amplification NKX2-1 amplification - equivocal TP53 G245V 7 Disease relevant genes with no reportable alterations: EGFR, ALK, BRAF, MET, ERBB2, RET, ROS1   PRIOR THERAPY: 1) Course of concurrent chemoradiation with weekly carboplatin for AUC of 2 and paclitaxel 45 mg/M2.  Status post 7 cycles.  Last dose was giving 01/11/2018. 2) Consolidation treatment with immunotherapy with Imfinzi (Durvalumab) 10 mg/KG every 2 weeks.  First dose February 09, 2018.  Status post 19 cycles. 3) status post stereotactic body radiotherapy to the enlarging right supraclavicular lymphadenopathy under the care of Dr. Lisbeth Renshaw. 4) SRS to multiple brain metastasis under the care of Dr. Lisbeth Renshaw.  CURRENT THERAPY: Systemic chemotherapy with carboplatin for AUC of 5, Alimta 500 mg/M2 and Keytruda 200 mg IV every 3 weeks.  First dose 08/16/2019.  Status post 8 cycles.  INTERVAL HISTORY: Susan Holder 64 y.o. female returns to the clinic for a follow up visit. The patient is feeling fairly well today without any concerning complaints. She just got back from a trip to [place] last week. She noticed bilateral ankle swelling last week which has improved at this time. The patient continues to tolerate treatment with Alimta and Keytruda well without any adverse  side effects except for some nausea 3-4 days following her treatment which is controlled with her anti-emetic. She denies vomiting. Denies any fever, chills, night sweats, or weight loss. She is trying to lose weight by walking on the treadmill. She is a little upset that she has not lost weight and notes that this is the heaviest that she has ever been. Denies any chest pain or hemoptysis. She reports some shortness of breath in the heat as well as walking while she was vacationing in [place]. She reports a mild baseline cough but states that it is "not as frequent" as before. Denies any diarrheaor constipation. Denies any headache or visual changes. She is scheduled for her next brain MRI on 12/08/19 due to her history of brain metastases. Denies any rashes or skin changes. The patient is here today for evaluation prior to starting cycle # 9    MEDICAL HISTORY: Past Medical History:  Diagnosis Date   met lung ca dx'd 09/2017   neck LN and brain 2020   Rheumatoid arthritis ([place])     ALLERGIES:  has No Known Allergies.  MEDICATIONS:  Current Outpatient Medications  Medication Sig Dispense Refill   aspirin EC 81 MG tablet Take 81 mg by mouth daily.     diclofenac sodium (VOLTAREN) 1 % GEL Apply 2 g topically 4 (four) times daily.     folic acid (FOLVITE) 1 MG tablet Take 1 tablet (1 mg total) by mouth daily. 30 tablet 4   hydroxychloroquine (PLAQUENIL) 200 MG tablet TAKE 1 TABLET BY MOUTH TWICE DAILY MONDAY-FRIDAY 120 tablet 0   naproxen sodium (ALEVE) 220 MG tablet Take 220 mg by mouth daily as needed (PAIN).  omeprazole (PRILOSEC) 20 MG capsule Take 20 mg by mouth daily.     Phenylephrine-DM-GG-APAP (DELSYM COUGH/COLD DAYTIME PO) Take by mouth as needed.      prochlorperazine (COMPAZINE) 10 MG tablet Take 1 tablet (10 mg total) by mouth every 6 (six) hours as needed for nausea or vomiting. 30 tablet 0   pseudoephedrine-guaifenesin (MUCINEX D) 60-600 MG 12 hr tablet Take 1 tablet  by mouth as needed.      umeclidinium-vilanterol (ANORO ELLIPTA) 62.5-25 MCG/INH AEPB Inhale 1 puff into the lungs daily. 1 each 0   VITAMIN D PO Take by mouth.     No current facility-administered medications for this visit.    SURGICAL HISTORY:  Past Surgical History:  Procedure Laterality Date   BACK SURGERY     BRONCHIAL NEEDLE ASPIRATION BIOPSY  11/09/2017   Procedure: BRONCHIAL NEEDLE ASPIRATION BIOPSIES;  Surgeon: Marshell Garfinkel, MD;  [facility]: [facility];  Service: Cardiopulmonary;;   ENDOBRONCHIAL ULTRASOUND Bilateral 11/09/2017   Procedure: ENDOBRONCHIAL ULTRASOUND;  Surgeon: Marshell Garfinkel, MD;  Location: [facility];  Service: Cardiopulmonary;  Laterality: Bilateral;   NASAL SINUS SURGERY     NASAL SINUS SURGERY     TUBAL LIGATION     VIDEO BRONCHOSCOPY  11/09/2017   Procedure: VIDEO BRONCHOSCOPY;  Surgeon: Marshell Garfinkel, MD;  [facility]: [facility];  Service: Cardiopulmonary;;    REVIEW OF SYSTEMS:   Review of Systems  Constitutional: Negative for appetite change, chills, fatigue, fever and unexpected weight change.  HENT: Negative for mouth sores, nosebleeds, sore throat and trouble swallowing.   Eyes: Negative for eye problems and icterus.  Respiratory: Positive for dyspnea on exertion and mild baseline cough. Negative for  hemoptysis and wheezing.   Cardiovascular: Negative for chest pain and leg swelling.  Gastrointestinal: Positive for mild nausea following treatment. Negative for abdominal pain, constipation, diarrhea, and vomiting.  Genitourinary: Negative for bladder incontinence, difficulty urinating, dysuria, frequency and hematuria.   Musculoskeletal: Negative for back pain, gait problem, neck pain and neck stiffness.  Skin: Negative for itching and rash.  Neurological: Negative for dizziness, extremity weakness, gait problem, headaches, light-headedness and seizures.  Hematological: Negative for adenopathy. Does not bruise/bleed easily.   Psychiatric/Behavioral: Negative for confusion, depression and sleep disturbance. The patient is not nervous/anxious.     PHYSICAL EXAMINATION:  Blood pressure 126/67, pulse (!) 115, temperature 98.1 F (36.7 C), temperature source Temporal, resp. rate 16, weight 145 lb 3.2 oz (65.9 kg), last menstrual period 04/28/2000, SpO2 100 %.  ECOG PERFORMANCE STATUS: 1 - Symptomatic but completely ambulatory  Physical Exam  Constitutional: Oriented to person, place, and time and well-developed, well-nourished, and in no distress.  HENT:  Head: Normocephalic and atraumatic.  Mouth/Throat: Oropharynx is clear and moist. No oropharyngeal exudate.  Eyes: Conjunctivae are normal. Right eye exhibits no discharge. Left eye exhibits no discharge. No scleral icterus.  Neck: Normal range of motion. Neck supple.  Cardiovascular: Tachycardic, Normal rhythm, normal heart sounds and intact distal pulses.   Pulmonary/Chest: Effort normal and breath sounds normal. No respiratory distress. No wheezes. No rales.  Abdominal: Soft. Bowel sounds are normal. Exhibits no distension and no mass. There is no tenderness.  Musculoskeletal: Normal range of motion. Exhibits no edema.  Lymphadenopathy:    No cervical adenopathy.  Neurological: Alert and oriented to person, place, and time. Exhibits normal muscle tone. Gait normal. Coordination normal.  Skin: Skin is warm and dry. No rash noted. Not diaphoretic. No erythema. No pallor.  Psychiatric: Mood, memory and judgment normal.  Vitals reviewed.  LABORATORY DATA: Lab Results  Component Value Date   WBC 3.3 (L) 11/15/2019   HGB 10.1 (L) 11/15/2019   HCT 30.2 (L) 11/15/2019   MCV 100.3 (H) 11/15/2019   PLT 211 11/15/2019      Chemistry      Component Value Date/Time   NA 141 11/15/2019 0843   K 4.1 11/15/2019 0843   CL 108 11/15/2019 0843   CO2 22 11/15/2019 0843   BUN 11 11/15/2019 0843   CREATININE 0.98 11/15/2019 0843   CREATININE 0.80 07/24/2017 1438       Component Value Date/Time   CALCIUM 9.8 11/15/2019 0843   ALKPHOS 79 11/15/2019 0843   AST 43 (H) 11/15/2019 0843   ALT 20 11/15/2019 0843   BILITOT 0.2 (L) 11/15/2019 0843       RADIOGRAPHIC STUDIES:  No results found.   ASSESSMENT/PLAN:  This is a very pleasant 64 year old African American female with metastatic non-small cell lung cancer, adenocarcinoma initially diagnosed as IIIA in July 2019. She had evidence of metastatic disease with brain metastases in October 2020. She has no actionable mutations.   She is status post 7 cycles of concurrent chemoradiation with carboplatin for an SUC of 2 and paclitaxel 45 mg/m2. She had a partial response.   She then underwent consolidation immunotherapy with imfinzi. She is status post 18 cycles.   She completed SBRT to the right supraclavicular lymphadenopathy.   She had evidence of multiple brain metastases in October 2020. She underwent SRS treatment to this lesion under the care of Dr. Lisbeth Renshaw.   She currently is undergoing systemic chemotherapy with carboplatin for an AUC of 5, Alimta 500 mg/m2 and Keytruda 200 mg IV every 3 weeks. She is status post 8 cycles. Starting from cycle #5 she started maintenance alimta and [place].   Labs were reviewed. Recommend that she proceed with cycle #9 today as scheduled.    We will see her back for a follow up visit in 3 weeks for evaluation before starting cycle #10.   The patient was advised to call immediately if she has any concerning symptoms in the interval. The patient voices understanding of current disease status and treatment options and is in agreement with the current care plan. All questions were answered. The patient knows to call the clinic with any problems, questions or concerns. We can certainly see the patient much sooner if necessary      Orders Placed This Encounter  Procedures   CBC with Differential ([facility] Only)    Standing Status:   Standing     Number of Occurrences:   18    Standing Expiration Date:   11/14/2020   CMP ([facility] only)    Standing Status:   Standing    Number of Occurrences:   18    Standing Expiration Date:   11/14/2020     Jacoby Ritsema L Zari Cly, PA-C 11/15/19

## 2019-11-14 ENCOUNTER — Other Ambulatory Visit: Payer: Self-pay | Admitting: Medical Oncology

## 2019-11-14 DIAGNOSIS — C3431 Malignant neoplasm of lower lobe, right bronchus or lung: Secondary | ICD-10-CM

## 2019-11-15 ENCOUNTER — Inpatient Hospital Stay (HOSPITAL_BASED_OUTPATIENT_CLINIC_OR_DEPARTMENT_OTHER): Payer: Medicare PPO | Admitting: Physician Assistant

## 2019-11-15 ENCOUNTER — Ambulatory Visit: Payer: Medicare PPO | Admitting: Rheumatology

## 2019-11-15 ENCOUNTER — Other Ambulatory Visit: Payer: Self-pay

## 2019-11-15 ENCOUNTER — Inpatient Hospital Stay: Payer: Medicare PPO

## 2019-11-15 VITALS — HR 108

## 2019-11-15 VITALS — BP 126/67 | HR 115 | Temp 98.1°F | Resp 16 | Wt 145.2 lb

## 2019-11-15 DIAGNOSIS — C3431 Malignant neoplasm of lower lobe, right bronchus or lung: Secondary | ICD-10-CM

## 2019-11-15 DIAGNOSIS — Z5112 Encounter for antineoplastic immunotherapy: Secondary | ICD-10-CM | POA: Diagnosis not present

## 2019-11-15 DIAGNOSIS — C3491 Malignant neoplasm of unspecified part of right bronchus or lung: Secondary | ICD-10-CM

## 2019-11-15 DIAGNOSIS — Z5111 Encounter for antineoplastic chemotherapy: Secondary | ICD-10-CM | POA: Diagnosis not present

## 2019-11-15 LAB — CMP (CANCER CENTER ONLY)
ALT: 20 U/L (ref 0–44)
AST: 43 U/L — ABNORMAL HIGH (ref 15–41)
Albumin: 3.7 g/dL (ref 3.5–5.0)
Alkaline Phosphatase: 79 U/L (ref 38–126)
Anion gap: 11 (ref 5–15)
BUN: 11 mg/dL (ref 8–23)
CO2: 22 mmol/L (ref 22–32)
Calcium: 9.8 mg/dL (ref 8.9–10.3)
Chloride: 108 mmol/L (ref 98–111)
Creatinine: 0.98 mg/dL (ref 0.44–1.00)
GFR, Est AFR Am: >60 mL/min (ref >60)
GFR, Estimated: >60 mL/min (ref >60)
Glucose, Bld: 103 mg/dL — ABNORMAL HIGH (ref 70–99)
Potassium: 4.1 mmol/L (ref 3.5–5.1)
Sodium: 141 mmol/L (ref 135–145)
Total Bilirubin: 0.2 mg/dL — ABNORMAL LOW (ref 0.3–1.2)
Total Protein: 7.8 g/dL (ref 6.5–8.1)

## 2019-11-15 LAB — CBC WITH DIFFERENTIAL (CANCER CENTER ONLY)
Abs Immature Granulocytes: 0.02 10*3/uL (ref 0.00–0.07)
Basophils Absolute: 0 10*3/uL (ref 0.0–0.1)
Basophils Relative: 0 %
Eosinophils Absolute: 0 10*3/uL (ref 0.0–0.5)
Eosinophils Relative: 1 %
HCT: 30.2 % — ABNORMAL LOW (ref 36.0–46.0)
Hemoglobin: 10.1 g/dL — ABNORMAL LOW (ref 12.0–15.0)
Immature Granulocytes: 1 %
Lymphocytes Relative: 21 %
Lymphs Abs: 0.7 10*3/uL (ref 0.7–4.0)
MCH: 33.6 pg (ref 26.0–34.0)
MCHC: 33.4 g/dL (ref 30.0–36.0)
MCV: 100.3 fL — ABNORMAL HIGH (ref 80.0–100.0)
Monocytes Absolute: 0.8 10*3/uL (ref 0.1–1.0)
Monocytes Relative: 25 %
Neutro Abs: 1.7 10*3/uL (ref 1.7–7.7)
Neutrophils Relative %: 52 %
Platelet Count: 211 10*3/uL (ref 150–400)
RBC: 3.01 MIL/uL — ABNORMAL LOW (ref 3.87–5.11)
RDW: 18.3 % — ABNORMAL HIGH (ref 11.5–15.5)
WBC Count: 3.3 10*3/uL — ABNORMAL LOW (ref 4.0–10.5)
nRBC: 0 % (ref 0.0–0.2)

## 2019-11-15 LAB — TSH: TSH: 2.835 u[IU]/mL (ref 0.308–3.960)

## 2019-11-15 MED ORDER — PROCHLORPERAZINE MALEATE 10 MG PO TABS
ORAL_TABLET | ORAL | Status: AC
  Filled 2019-11-15: qty 1

## 2019-11-15 MED ORDER — SODIUM CHLORIDE 0.9 % IV SOLN
500.0000 mg/m2 | Freq: Once | INTRAVENOUS | Status: AC
  Administered 2019-11-15: 800 mg via INTRAVENOUS
  Filled 2019-11-15: qty 20

## 2019-11-15 MED ORDER — SODIUM CHLORIDE 0.9 % IV SOLN
Freq: Once | INTRAVENOUS | Status: AC
  Filled 2019-11-15: qty 250

## 2019-11-15 MED ORDER — SODIUM CHLORIDE 0.9 % IV SOLN
200.0000 mg | Freq: Once | INTRAVENOUS | Status: AC
  Administered 2019-11-15: 200 mg via INTRAVENOUS
  Filled 2019-11-15: qty 8

## 2019-11-15 MED ORDER — PROCHLORPERAZINE MALEATE 10 MG PO TABS
10.0000 mg | ORAL_TABLET | Freq: Once | ORAL | Status: AC
  Administered 2019-11-15: 10 mg via ORAL

## 2019-11-15 NOTE — Patient Instructions (Signed)
[facility] Discharge Instructions for Patients Receiving Chemotherapy  Today you received the following chemotherapy agents: Keytruda, Alimta  To help prevent nausea and vomiting after your treatment, we encourage you to take your nausea medication as directed.    If you develop nausea and vomiting that is not controlled by your nausea medication, call the clinic.   BELOW ARE SYMPTOMS THAT SHOULD BE REPORTED IMMEDIATELY:  *FEVER GREATER THAN 100.5 F  *CHILLS WITH OR WITHOUT FEVER  NAUSEA AND VOMITING THAT IS NOT CONTROLLED WITH YOUR NAUSEA MEDICATION  *UNUSUAL SHORTNESS OF BREATH  *UNUSUAL BRUISING OR BLEEDING  TENDERNESS IN MOUTH AND THROAT WITH OR WITHOUT PRESENCE OF ULCERS  *URINARY PROBLEMS  *BOWEL PROBLEMS  UNUSUAL RASH Items with * indicate a potential emergency and should be followed up as soon as possible.  Feel free to call the clinic should you have any questions or concerns. The clinic phone number is [phone].  Please show the [facility] at check-in to the Emergency Department and triage nurse.

## 2019-11-15 NOTE — Progress Notes (Signed)
Per Cassandra Hellingoetter, PA, ok to treat with elevated pulse.

## 2019-11-16 ENCOUNTER — Telehealth: Payer: Self-pay | Admitting: Physician Assistant

## 2019-11-16 NOTE — Telephone Encounter (Signed)
Scheduled per los. Called, not available. Mailed printout

## 2019-11-24 ENCOUNTER — Other Ambulatory Visit: Payer: Self-pay | Admitting: Rheumatology

## 2019-11-24 DIAGNOSIS — M0579 Rheumatoid arthritis with rheumatoid factor of multiple sites without organ or systems involvement: Secondary | ICD-10-CM

## 2019-11-24 NOTE — Telephone Encounter (Signed)
Last Visit: 06/14/2019 Next Visit: 12/22/2019 Labs: 08/30/2019 Albumin 3.4, Total bilirubin 0.2, RBC 3.72, Hgb 11.6 PLQ eye exam: 02/03/2019 WNL   Current Dose per office note on 06/14/2019: Plaquenil 200 mg 1 tablet twice daily Monday through Friday only  Okay to refill PLQ?

## 2019-11-29 ENCOUNTER — Ambulatory Visit: Payer: Medicare PPO | Admitting: Internal Medicine

## 2019-11-29 ENCOUNTER — Other Ambulatory Visit: Payer: Medicare PPO

## 2019-11-29 ENCOUNTER — Ambulatory Visit: Payer: Medicare PPO

## 2019-12-06 ENCOUNTER — Other Ambulatory Visit: Payer: Self-pay

## 2019-12-06 ENCOUNTER — Encounter: Payer: Self-pay | Admitting: Internal Medicine

## 2019-12-06 ENCOUNTER — Inpatient Hospital Stay: Payer: Medicare PPO | Admitting: Internal Medicine

## 2019-12-06 ENCOUNTER — Inpatient Hospital Stay: Payer: Medicare PPO

## 2019-12-06 ENCOUNTER — Inpatient Hospital Stay: Payer: Medicare PPO | Attending: Internal Medicine

## 2019-12-06 VITALS — HR 105

## 2019-12-06 VITALS — BP 126/71 | HR 108 | Temp 97.0°F | Resp 18 | Ht 60.0 in | Wt 144.9 lb

## 2019-12-06 DIAGNOSIS — C3431 Malignant neoplasm of lower lobe, right bronchus or lung: Secondary | ICD-10-CM | POA: Diagnosis present

## 2019-12-06 DIAGNOSIS — R530 Neoplastic (malignant) related fatigue: Secondary | ICD-10-CM | POA: Insufficient documentation

## 2019-12-06 DIAGNOSIS — R682 Dry mouth, unspecified: Secondary | ICD-10-CM | POA: Insufficient documentation

## 2019-12-06 DIAGNOSIS — Z5112 Encounter for antineoplastic immunotherapy: Secondary | ICD-10-CM | POA: Insufficient documentation

## 2019-12-06 DIAGNOSIS — C3490 Malignant neoplasm of unspecified part of unspecified bronchus or lung: Secondary | ICD-10-CM

## 2019-12-06 DIAGNOSIS — C7931 Secondary malignant neoplasm of brain: Secondary | ICD-10-CM | POA: Insufficient documentation

## 2019-12-06 DIAGNOSIS — C3491 Malignant neoplasm of unspecified part of right bronchus or lung: Secondary | ICD-10-CM

## 2019-12-06 DIAGNOSIS — Z5111 Encounter for antineoplastic chemotherapy: Secondary | ICD-10-CM

## 2019-12-06 DIAGNOSIS — R000 Tachycardia, unspecified: Secondary | ICD-10-CM | POA: Diagnosis not present

## 2019-12-06 DIAGNOSIS — C770 Secondary and unspecified malignant neoplasm of lymph nodes of head, face and neck: Secondary | ICD-10-CM | POA: Insufficient documentation

## 2019-12-06 DIAGNOSIS — Z923 Personal history of irradiation: Secondary | ICD-10-CM | POA: Insufficient documentation

## 2019-12-06 DIAGNOSIS — Z79899 Other long term (current) drug therapy: Secondary | ICD-10-CM | POA: Diagnosis not present

## 2019-12-06 DIAGNOSIS — Z791 Long term (current) use of non-steroidal anti-inflammatories (NSAID): Secondary | ICD-10-CM | POA: Diagnosis not present

## 2019-12-06 DIAGNOSIS — C3411 Malignant neoplasm of upper lobe, right bronchus or lung: Secondary | ICD-10-CM | POA: Insufficient documentation

## 2019-12-06 DIAGNOSIS — Z7982 Long term (current) use of aspirin: Secondary | ICD-10-CM | POA: Insufficient documentation

## 2019-12-06 LAB — CBC WITH DIFFERENTIAL (CANCER CENTER ONLY)
Abs Immature Granulocytes: 0.01 10*3/uL (ref 0.00–0.07)
Basophils Absolute: 0 10*3/uL (ref 0.0–0.1)
Basophils Relative: 1 %
Eosinophils Absolute: 0 10*3/uL (ref 0.0–0.5)
Eosinophils Relative: 0 %
HCT: 31.6 % — ABNORMAL LOW (ref 36.0–46.0)
Hemoglobin: 10.4 g/dL — ABNORMAL LOW (ref 12.0–15.0)
Immature Granulocytes: 0 %
Lymphocytes Relative: 16 %
Lymphs Abs: 0.8 10*3/uL (ref 0.7–4.0)
MCH: 34 pg (ref 26.0–34.0)
MCHC: 32.9 g/dL (ref 30.0–36.0)
MCV: 103.3 fL — ABNORMAL HIGH (ref 80.0–100.0)
Monocytes Absolute: 0.9 10*3/uL (ref 0.1–1.0)
Monocytes Relative: 17 %
Neutro Abs: 3.4 10*3/uL (ref 1.7–7.7)
Neutrophils Relative %: 66 %
Platelet Count: 335 10*3/uL (ref 150–400)
RBC: 3.06 MIL/uL — ABNORMAL LOW (ref 3.87–5.11)
RDW: 17.3 % — ABNORMAL HIGH (ref 11.5–15.5)
WBC Count: 5.2 10*3/uL (ref 4.0–10.5)
nRBC: 0 % (ref 0.0–0.2)

## 2019-12-06 LAB — CMP (CANCER CENTER ONLY)
ALT: 25 U/L (ref 0–44)
AST: 41 U/L (ref 15–41)
Albumin: 3.6 g/dL (ref 3.5–5.0)
Alkaline Phosphatase: 88 U/L (ref 38–126)
Anion gap: 9 (ref 5–15)
BUN: 12 mg/dL (ref 8–23)
CO2: 22 mmol/L (ref 22–32)
Calcium: 9.9 mg/dL (ref 8.9–10.3)
Chloride: 108 mmol/L (ref 98–111)
Creatinine: 0.96 mg/dL (ref 0.44–1.00)
GFR, Est AFR Am: >60 mL/min (ref >60)
GFR, Estimated: >60 mL/min (ref >60)
Glucose, Bld: 93 mg/dL (ref 70–99)
Potassium: 4 mmol/L (ref 3.5–5.1)
Sodium: 139 mmol/L (ref 135–145)
Total Bilirubin: 0.3 mg/dL (ref 0.3–1.2)
Total Protein: 7.7 g/dL (ref 6.5–8.1)

## 2019-12-06 LAB — TSH: TSH: 1.343 u[IU]/mL (ref 0.308–3.960)

## 2019-12-06 MED ORDER — CYANOCOBALAMIN 1000 MCG/ML IJ SOLN
INTRAMUSCULAR | Status: AC
  Filled 2019-12-06: qty 1

## 2019-12-06 MED ORDER — SODIUM CHLORIDE 0.9 % IV SOLN
500.0000 mg/m2 | Freq: Once | INTRAVENOUS | Status: AC
  Administered 2019-12-06: 800 mg via INTRAVENOUS
  Filled 2019-12-06: qty 12

## 2019-12-06 MED ORDER — PROCHLORPERAZINE MALEATE 10 MG PO TABS
ORAL_TABLET | ORAL | Status: AC
  Filled 2019-12-06: qty 1

## 2019-12-06 MED ORDER — CYANOCOBALAMIN 1000 MCG/ML IJ SOLN
1000.0000 ug | Freq: Once | INTRAMUSCULAR | Status: AC
  Administered 2019-12-06: 1000 ug via INTRAMUSCULAR

## 2019-12-06 MED ORDER — SODIUM CHLORIDE 0.9 % IV SOLN
200.0000 mg | Freq: Once | INTRAVENOUS | Status: AC
  Administered 2019-12-06: 200 mg via INTRAVENOUS
  Filled 2019-12-06: qty 8

## 2019-12-06 MED ORDER — PROCHLORPERAZINE MALEATE 10 MG PO TABS
10.0000 mg | ORAL_TABLET | Freq: Once | ORAL | Status: AC
  Administered 2019-12-06: 10 mg via ORAL

## 2019-12-06 MED ORDER — SODIUM CHLORIDE 0.9 % IV SOLN
Freq: Once | INTRAVENOUS | Status: AC
  Filled 2019-12-06: qty 250

## 2019-12-06 MED ORDER — PROCHLORPERAZINE MALEATE 10 MG PO TABS: 10.0000 mg | ORAL_TABLET | Freq: Four times a day (QID) | ORAL | 0 refills | Status: DC | PRN

## 2019-12-06 NOTE — Progress Notes (Signed)
[place] [phone]   [phone]  OFFICE PROGRESS NOTE  Patient, No Pcp Per No address on file  DIAGNOSIS: Metastatic non-small cell lung cancer initially diagnosed as stage IIIA (T3, N1/N2, M0) non-small cell lung cancer, adenocarcinoma presented with large right lower lobe lung mass with extension to the right hilum and subcarinal area diagnosed in July 2019.  She has brain metastasis in October 2020.  Biomarker Findings Tumor Mutational Burden - TMB-Intermediate (6 Muts/Mb) Microsatellite status - MS-Stable Genomic Findings For a complete list of the genes assayed, please refer to the Appendix. NRAS Q61R ARAF amplification STK11 G56W KRAS G13D MYCN amplification MCL1 amplification NKX2-1 amplification - equivocal? TP53 G245V 7 Disease relevant genes with no reportable alterations: EGFR, ALK, BRAF, MET, ERBB2, RET, ROS1   PRIOR THERAPY:  1) Course of concurrent chemoradiation with weekly carboplatin for AUC of 2 and paclitaxel 45 mg/M2.  Status post 7 cycles.  Last dose was giving 01/11/2018. 2) Consolidation treatment with immunotherapy with Imfinzi (Durvalumab) 10 mg/KG every 2 weeks.  First dose February 09, 2018.  Status post 19 cycles. 3) status post stereotactic body radiotherapy to the enlarging right supraclavicular lymphadenopathy under the care of Dr. Lisbeth Renshaw. 4) SRS to multiple brain metastasis under the care of Dr. Lisbeth Renshaw.  CURRENT THERAPY: Systemic chemotherapy with carboplatin for AUC of 5, Alimta 500 mg/M2 and Keytruda 200 mg IV every 3 weeks.  First dose 08/16/2019.  Status post 5 cycles.  INTERVAL HISTORY: Susan Holder 64 y.o. female returns to the clinic today for follow-up visit.  The patient is feeling fine today with no concerning complaints except for dryness of her mouth.  She enjoyed a holiday in [place] with her granddaughter.  She denied having any current chest pain, shortness of breath, cough or  hemoptysis.  She denied having any fever or chills.  She has no nausea, vomiting, diarrhea or constipation.  She has no headache or visual changes.  The patient is here today for evaluation before starting cycle #6 of her treatment.   MEDICAL HISTORY: Past Medical History:  Diagnosis Date  . met lung ca dx'd 09/2017   neck LN and brain 2020  . Rheumatoid arthritis ([place])     ALLERGIES:  has No Known Allergies.  MEDICATIONS:  Current Outpatient Medications  Medication Sig Dispense Refill  . aspirin EC 81 MG tablet Take 81 mg by mouth daily.    . diclofenac sodium (VOLTAREN) 1 % GEL Apply 2 g topically 4 (four) times daily.    . folic acid (FOLVITE) 1 MG tablet Take 1 tablet (1 mg total) by mouth daily. 30 tablet 4  . hydroxychloroquine (PLAQUENIL) 200 MG tablet TAKE 1 TABLET BY MOUTH TWICE DAILY MONDAY-FRIDAY 120 tablet 0  . naproxen sodium (ALEVE) 220 MG tablet Take 220 mg by mouth daily as needed (PAIN).    Marland Kitchen omeprazole (PRILOSEC) 20 MG capsule Take 20 mg by mouth daily.    Marland Kitchen Phenylephrine-DM-GG-APAP (DELSYM COUGH/COLD DAYTIME PO) Take by mouth as needed.     . prochlorperazine (COMPAZINE) 10 MG tablet Take 1 tablet (10 mg total) by mouth every 6 (six) hours as needed for nausea or vomiting. 30 tablet 0  . pseudoephedrine-guaifenesin (MUCINEX D) 60-600 MG 12 hr tablet Take 1 tablet by mouth as needed.     . umeclidinium-vilanterol (ANORO ELLIPTA) 62.5-25 MCG/INH AEPB Inhale 1 puff into the lungs daily. 1 each 0  . VITAMIN D PO Take by mouth.  No current facility-administered medications for this visit.    SURGICAL HISTORY:  Past Surgical History:  Procedure Laterality Date  . BACK SURGERY    . BRONCHIAL NEEDLE ASPIRATION BIOPSY  11/09/2017   Procedure: BRONCHIAL NEEDLE ASPIRATION BIOPSIES;  Surgeon: Marshell Garfinkel, MD;  Location: [facility];  Service: Cardiopulmonary;;  . ENDOBRONCHIAL ULTRASOUND Bilateral 11/09/2017   Procedure: ENDOBRONCHIAL ULTRASOUND;  Surgeon: Marshell Garfinkel, MD;  Location: [facility];  Service: Cardiopulmonary;  Laterality: Bilateral;  . NASAL SINUS SURGERY    . NASAL SINUS SURGERY    . TUBAL LIGATION    . VIDEO BRONCHOSCOPY  11/09/2017   Procedure: VIDEO BRONCHOSCOPY;  Surgeon: Marshell Garfinkel, MD;  Location: [facility];  Service: Cardiopulmonary;;    REVIEW OF SYSTEMS:  A comprehensive review of systems was negative except for: Constitutional: positive for fatigue Ears, nose, mouth, throat, and face: positive for Dryness of the mouth   PHYSICAL EXAMINATION: General appearance: alert, cooperative and no distress Head: Normocephalic, without obvious abnormality, atraumatic Neck: no adenopathy, no JVD, supple, symmetrical, trachea midline and thyroid not enlarged, symmetric, no tenderness/mass/nodules Lymph nodes: Cervical, supraclavicular, and axillary nodes normal. Resp: clear to auscultation bilaterally Back: symmetric, no curvature. ROM normal. No CVA tenderness. Cardio: regular rate and rhythm, S1, S2 normal, no murmur, click, rub or gallop GI: soft, non-tender; bowel sounds normal; no masses,  no organomegaly Extremities: extremities normal, atraumatic, no cyanosis or edema  ECOG PERFORMANCE STATUS: 1 - Symptomatic but completely ambulatory  Blood pressure 126/71, pulse (!) 108, temperature (!) 97 F (36.1 C), temperature source Tympanic, resp. rate 18, height 5' (1.524 m), weight 144 lb 14.4 oz (65.7 kg), last menstrual period 04/28/2000, SpO2 100 %.  LABORATORY DATA: Lab Results  Component Value Date   WBC 5.2 12/06/2019   HGB 10.4 (L) 12/06/2019   HCT 31.6 (L) 12/06/2019   MCV 103.3 (H) 12/06/2019   PLT 335 12/06/2019      Chemistry      Component Value Date/Time   NA 141 11/15/2019 0843   K 4.1 11/15/2019 0843   CL 108 11/15/2019 0843   CO2 22 11/15/2019 0843   BUN 11 11/15/2019 0843   CREATININE 0.98 11/15/2019 0843   CREATININE 0.80 07/24/2017 1438      Component Value Date/Time   CALCIUM 9.8  11/15/2019 0843   ALKPHOS 79 11/15/2019 0843   AST 43 (H) 11/15/2019 0843   ALT 20 11/15/2019 0843   BILITOT 0.2 (L) 11/15/2019 0843       RADIOGRAPHIC STUDIES: No results found.  ASSESSMENT AND PLAN: This is a very pleasant 64 years old African-American female with metastatic non-small cell lung cancer initially diagnosed with a stage IIIA non-small cell lung cancer, adenocarcinoma.  She underwent a course of concurrent chemoradiation with weekly carboplatin and paclitaxel status post 7 cycles with partial response.   The patient tolerated this course of treatment well except for mild odynophagia and dysphagia. She completed on consolidation treatment with immunotherapy with Imfinzi (Durvalumab) status post 18 cycles. She also completed SBRT to the right supraclavicular lymphadenopathy. The patient had evidence for multiple brain metastasis in October 2020 and she underwent SRS treatment to this lesion under the care of Dr. Lisbeth Renshaw. The patient had evidence for disease progression and she started systemic chemotherapy with carboplatin, Alimta and Keytruda status post 5 cycles.  The patient continues to tolerate her treatment well with no concerning adverse effects. I recommended for her to proceed with cycle #6 today as planned. I will see  her back for follow-up visit in 3 weeks for evaluation with repeat CT scan of the chest, abdomen pelvis for restaging of her disease.  She is also scheduled to have MRI of the brain next week for evaluation of the brain metastasis. For the dry mouth, the patient was encouraged to increase her oral intake and also to use Biotene as needed. She was advised to call immediately if she has any other concerning symptoms in the interval.  The patient voices understanding of current disease status and treatment options and is in agreement with the current care plan. All questions were answered. The patient knows to call the clinic with any problems, questions or  concerns. We can certainly see the patient much sooner if necessary.  Disclaimer: This note was dictated with voice recognition software. Similar sounding words can inadvertently be transcribed and may not be corrected upon review.

## 2019-12-06 NOTE — Patient Instructions (Signed)
[facility] Discharge Instructions for Patients Receiving Chemotherapy  Today you received the following chemotherapy agents: Keytruda, Alimta  To help prevent nausea and vomiting after your treatment, we encourage you to take your nausea medication as directed.    If you develop nausea and vomiting that is not controlled by your nausea medication, call the clinic.   BELOW ARE SYMPTOMS THAT SHOULD BE REPORTED IMMEDIATELY:  *FEVER GREATER THAN 100.5 F  *CHILLS WITH OR WITHOUT FEVER  NAUSEA AND VOMITING THAT IS NOT CONTROLLED WITH YOUR NAUSEA MEDICATION  *UNUSUAL SHORTNESS OF BREATH  *UNUSUAL BRUISING OR BLEEDING  TENDERNESS IN MOUTH AND THROAT WITH OR WITHOUT PRESENCE OF ULCERS  *URINARY PROBLEMS  *BOWEL PROBLEMS  UNUSUAL RASH Items with * indicate a potential emergency and should be followed up as soon as possible.  Feel free to call the clinic should you have any questions or concerns. The clinic phone number is [phone].  Please show the [facility] at check-in to the Emergency Department and triage nurse.

## 2019-12-06 NOTE — Progress Notes (Signed)
Per Dr Julien Nordmann ok to treat today with Pulse 105

## 2019-12-07 ENCOUNTER — Telehealth: Payer: Self-pay | Admitting: Internal Medicine

## 2019-12-07 NOTE — Telephone Encounter (Signed)
Scheduled appt per 8/10 los - pt to get an updated schedule next visit.

## 2019-12-08 ENCOUNTER — Encounter: Payer: Self-pay | Admitting: Radiation Oncology

## 2019-12-08 ENCOUNTER — Other Ambulatory Visit: Payer: Self-pay

## 2019-12-08 ENCOUNTER — Ambulatory Visit
Admission: RE | Admit: 2019-12-08 | Discharge: 2019-12-08 | Disposition: A | Discharge status: Home / Self Care | Payer: Medicare PPO | Source: Ambulatory Visit | Attending: Radiation Oncology | Admitting: Radiation Oncology

## 2019-12-08 DIAGNOSIS — C7931 Secondary malignant neoplasm of brain: Secondary | ICD-10-CM

## 2019-12-08 MED ORDER — GADOBENATE DIMEGLUMINE 529 MG/ML IV SOLN
13.0000 mL | Freq: Once | INTRAVENOUS | Status: AC | PRN
  Administered 2019-12-08: 13 mL via INTRAVENOUS

## 2019-12-08 NOTE — Progress Notes (Signed)
Office Visit Note  Patient: Susan Holder             Date of Birth: Jan 25, 1956           MRN: [id number]             PCP: Patient, No Pcp Per Referring: No ref. provider found Visit Date: 12/22/2019 Occupation: _0 @  Subjective:  Mouth dryness   History of Present Illness: Susan Holder is a 64 y.o. female with history of seropositive rheumatoid arthritis and DDD.  She is taking plaquenil 200 mg 1 tablet by mouth twice daily M-F.  She denies any recent rheumatoid arthritis flares. She has bilateral middle trigger finger, which causes discomfort.  She experiences locking every morning. She has had cortisone injections in the past. She denies any joint swelling. She has been experiencing increased mouth dryness, and she is unsure if it is a medication side effect.   Activities of Daily Living:  Patient reports morning stiffness for 30 minutes.   Patient Denies nocturnal pain.  Difficulty dressing/grooming: Denies Difficulty climbing stairs: Denies Difficulty getting out of chair: Denies Difficulty using hands for taps, buttons, cutlery, and/or writing: Reports  Review of Systems  Constitutional: Positive for fatigue.  HENT: Positive for mouth dryness. Negative for mouth sores and nose dryness.   Eyes: Negative for pain, itching, visual disturbance and dryness.  Respiratory: Negative for cough, hemoptysis, shortness of breath and difficulty breathing.   Cardiovascular: Negative for chest pain, palpitations, hypertension and swelling in legs/feet.  Gastrointestinal: Negative for blood in stool, constipation and diarrhea.  Endocrine: Negative for increased urination.  Genitourinary: Negative for difficulty urinating and painful urination.  Musculoskeletal: Positive for arthralgias, joint pain and morning stiffness. Negative for joint swelling, myalgias, muscle weakness, muscle tenderness and myalgias.  Skin: Negative for color change, pallor, rash, hair loss, nodules/bumps,  redness, skin tightness, ulcers and sensitivity to sunlight.  Allergic/Immunologic: Negative for susceptible to infections.  Neurological: Negative for dizziness, numbness, headaches, memory loss and weakness.  Hematological: Negative for bruising/bleeding tendency and swollen glands.  Psychiatric/Behavioral: Negative for depressed mood, confusion and sleep disturbance. The patient is not nervous/anxious.     PMFS History:  Patient Active Problem List   Diagnosis Date Noted  . Brain metastases ([place]) 03/29/2019  . 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 Acid reflux 09/21/2018  . Hoarseness 03/16/2018  . Lung cancer ([place]) 03/16/2018  . Encounter for antineoplastic immunotherapy 02/01/2018  . Primary malignant neoplasm of bronchus of right lower lobe ([place]) 12/21/2017  . Adenocarcinoma of right lung, stage 3 ([place]) 11/17/2017  . Encounter for antineoplastic chemotherapy 11/17/2017  . Goals of care, counseling/discussion 11/17/2017  . Lung mass   . Left hand pain 03/06/2017  . Rheumatoid arthritis involving multiple sites with positive rheumatoid factor ([place]) 05/29/2016  . ANA positive 05/29/2016  . Vitamin D deficiency 05/29/2016  . High risk medication use 05/29/2016  . Trigger finger, left ring finger 05/29/2016  . Trigger finger, right ring finger 05/29/2016  . DDD (degenerative disc disease), cervical 05/29/2016  . Smoker 05/29/2016  . Other fatigue 05/29/2016  . Allergic rhinitis 08/22/2013    Past Medical History:  Diagnosis Date  . met lung ca dx'd 09/2017   neck LN and brain 2020  . Rheumatoid arthritis ([place])     Family History  Problem Relation Age of Onset  . Stroke Mother   . Alzheimer's disease Mother   . Heart disease Mother   . Emphysema Father   .  Hypertension Brother   . Heart attack Maternal Aunt   . Heart failure Maternal Grandmother   . Hypertension Paternal Grandmother    Past Surgical History:  Procedure Laterality Date  . BACK  SURGERY    . BRONCHIAL NEEDLE ASPIRATION BIOPSY  11/09/2017   Procedure: BRONCHIAL NEEDLE ASPIRATION BIOPSIES;  Surgeon: Marshell Garfinkel, MD;  [facility];  Service: Cardiopulmonary;;  . ENDOBRONCHIAL ULTRASOUND Bilateral 11/09/2017   Procedure: ENDOBRONCHIAL ULTRASOUND;  Surgeon: Marshell Garfinkel, MD;  Location[facility];  Service: Cardiopulmonary;  Laterality: Bilateral;  . NASAL SINUS SURGERY    . NASAL SINUS SURGERY    . TUBAL LIGATION    . VIDEO BRONCHOSCOPY  11/09/2017   Procedure: VIDEO BRONCHOSCOPY;  Surgeon: Marshell Garfinkel, MD;  Location: [facility];  Service: Cardiopulmonary;;   Social History   Social History Narrative  . Not on file   Immunization History  Administered Date(s) Administered  . Influenza, High Dose Seasonal PF 01/22/2019  . Influenza,inj,Quad PF,6+ Mos 03/08/2018  . PFIZER SARS-COV-2 Vaccination 06/02/2019, 06/23/2019     Objective: Vital Signs: BP 127/81 (BP Location: Left Arm, Patient Position: Sitting, Cuff Size: Normal)   Pulse (!) 116   Resp 13   Ht 5' (1.524 m)   Wt 143 lb 3.2 oz (65 kg)   LMP 04/28/2000   BMI 27.97 kg/m    Physical Exam Vitals and nursing note reviewed.  Constitutional:      Appearance: She is well-developed.  HENT:     Head: Normocephalic and atraumatic.  Eyes:     Conjunctiva/sclera: Conjunctivae normal.  Pulmonary:     Effort: Pulmonary effort is normal.     Breath sounds: Normal breath sounds.  Abdominal:     General: Bowel sounds are normal.     Palpations: Abdomen is soft.  Musculoskeletal:     Cervical back: Normal range of motion.  Lymphadenopathy:     Cervical: No cervical adenopathy.  Skin:    General: Skin is warm and dry.     Capillary Refill: Capillary refill takes less than 2 seconds.  Neurological:     Mental Status: She is alert and oriented to person, place, and time.  Psychiatric:        Behavior: Behavior normal.      Musculoskeletal Exam: C-spine, thoracic spine, and  lumbar spine good ROM.  Shoulder joints, elbow joints, wrist joints, MCPs, PIPs, and DIPs good ROM with no synovitis.  Bilateral middle trigger fingers.  Complete fist formation bilaterally.  Hip joints, knee joints, ankle joints, MTPs, PIPs, and DIPs good ROM with no synovitis.   No warmth or effusion of knee joints.  No tenderness or swelling of ankle joints.  CDAI Exam: CDAI Score: -- Patient Global: --; Provider Global: -- Swollen: --; Tender: -- Joint Exam 12/22/2019   No joint exam has been documented for this visit   There is currently no information documented on the homunculus. Go to the Rheumatology activity and complete the homunculus joint exam.  Investigation: No additional findings.  Imaging: MR Brain W Wo Contrast  Result Date: 12/08/2019 CLINICAL DATA:  Follow-up brain metastasis.  Lung cancer. EXAM: MRI HEAD WITHOUT AND WITH CONTRAST TECHNIQUE: Multiplanar, multiecho pulse sequences of the brain and surrounding structures were obtained without and with intravenous contrast. CONTRAST:  48m MULTIHANCE GADOBENATE DIMEGLUMINE 529 MG/ML IV SOLN COMPARISON:  MRI hand 09/07/2019, 06/09/2019 FINDINGS: Brain: Enhancing metastatic deposits are present in the brain. 4 mm lesion left posterior cerebellum slightly smaller Stable 3 mm lesion  in the right medial cerebellum axial image 36. 2 mm lesion in the left middle frontal gyrus slightly better seen but approximately the same size Previously treated lesion left posterior insula no longer visualized Left lateral cerebellar lesion appears larger with additional area of adjacent enhancement laterally. Overall this lesion now measures approximately 4 x 8 mm 3 mm lesion in the right inferior frontal lobe anterior to the middle cerebral artery appears slightly larger. Ventricle size normal. No acute infarct. Scattered white matter hyperintensities bilaterally are stable. Negative for intracranial hemorrhage Vascular: Normal arterial flow voids  Skull and upper cervical spine: No focal skeletal lesion identified. Sinuses/Orbits: Mild mucosal edema paranasal sinuses. Left mastoid effusion. Normal orbit Other: None IMPRESSION: Multiple treated metastatic deposits in the brain. Left lateral cerebellar lesion appears larger now measuring 4 x 8 mm. 3 mm lesion right inferior frontal lobe appears larger. Remaining lesions are stable or improved. Electronically Signed   By: Franchot Gallo M.D.   On: 12/08/2019 14:28    Recent Labs: Lab Results  Component Value Date   WBC 5.2 12/06/2019   HGB 10.4 (L) 12/06/2019   PLT 335 12/06/2019   NA 139 12/06/2019   K 4.0 12/06/2019   CL 108 12/06/2019   CO2 22 12/06/2019   GLUCOSE 93 12/06/2019   BUN 12 12/06/2019   CREATININE 0.96 12/06/2019   BILITOT 0.3 12/06/2019   ALKPHOS 88 12/06/2019   AST 41 12/06/2019   ALT 25 12/06/2019   PROT 7.7 12/06/2019   ALBUMIN 3.6 12/06/2019   CALCIUM 9.9 12/06/2019   GFRAA >60 12/06/2019    Speciality Comments: PLQ eye exam: 02/03/2019 WNL @ [facility].  Procedures:  No procedures performed Allergies: Patient has no known allergies.   Assessment / Plan:     Visit Diagnoses: Rheumatoid arthritis involving multiple sites with positive rheumatoid factor (HCC) - +RF, +anti-CCP, +ANA with nodulosis: She has no tenderness or synovitis on exam.  She has not had any recent rheumatoid arthritis flares.  She is clinically doing well on Plaquenil 200 mg 1 tablet by mouth twice daily Monday through Friday.  She is not experiencing any joint pain or inflammation at this time.  She will continue taking Plaquenil as prescribed.  She was advised to notify us if she develops increased joint pain or joint swelling.  She will follow-up in the office in 5 months.  High risk medication use - Plaquenil 200 mg 1 tablet twice daily Monday through Friday only.eye exam: 02/03/2019.  CBC and CMP were drawn on 12/06/2019. She has received both eyes her vaccinations and was encouraged to  receive the third dose.  She was advised to notify us or her PCP if she develops a COVID-19 infection in order to receive antibody infusion.  Use of mask, social distancing and hand hygiene was emphasized.  DDD (degenerative disc disease), cervical: She has good ROM with no discomfort.    Acquired trigger finger of both middle fingers: She experiences intermittent tenderness and locking.  She has had cortisone injections in the past.  We discussed the use of Voltaren gel and massage for pain relief.  She declined a referral to hand surgery at this time.  Vitamin D deficiency: Vitamin D was 39 on 11/08/18.  She is taking a daily vitamin D supplement.  Other medical conditions are listed as follows:  Primary malignant neoplasm of bronchus of right lower lobe ([place])  Adenocarcinoma of right lung, stage 3 ([place])  Orders: No orders of the defined types were  placed in this encounter.  No orders of the defined types were placed in this encounter.    Follow-Up Instructions: Return in about 5 months (around 05/23/2020) for Rheumatoid arthritis.   Bo Merino, MD   Scribed by-  Hazel Sams, PA-C  Note - This record has been created using Dragon software.  Chart creation errors have been sought, but may not always  have been located. Such creation errors do not reflect on  the standard of medical care.

## 2019-12-10 ENCOUNTER — Other Ambulatory Visit: Payer: Self-pay | Admitting: Internal Medicine

## 2019-12-12 ENCOUNTER — Ambulatory Visit
Admission: RE | Admit: 2019-12-12 | Discharge: 2019-12-12 | Disposition: A | Discharge status: Home / Self Care | Payer: Medicare PPO | Source: Ambulatory Visit | Attending: Radiation Oncology | Admitting: Radiation Oncology

## 2019-12-12 ENCOUNTER — Inpatient Hospital Stay: Payer: Medicare PPO

## 2019-12-12 DIAGNOSIS — C7931 Secondary malignant neoplasm of brain: Secondary | ICD-10-CM

## 2019-12-12 NOTE — Progress Notes (Signed)
Radiation Oncology         [phone] ________________________________  Outpatient Follow Up - Conducted via telephone due to current COVID-19 concerns for limiting patient exposure  I spoke with the patient to conduct this consult visit via telephone to spare the patient unnecessary potential exposure in the healthcare setting during the current COVID-19 pandemic. The patient was notified in advance and was offered a [place] meeting to allow for face to face communication but unfortunately reported that they did not have the appropriate resources/technology to support such a visit and instead preferred to proceed with a telephone visit.   ________________________________  Name: AARILYN DYE MRN: [id number]  Date of Service: 12/12/2019  DOB: 1956/02/19  Post Treatment Telephone Note  DIAGNOSIS:   Progressive MetastaticStage IIIA, cT3N1-2M0 NSCLC, adenocarcinoma of the right lower lobe with brain metastases  INTERVAL SINCE LAST RADIOTHERAPY: 9 months   03/09/2019 SRS Treatment: Each site was treated to 20 Gy in a single fraction PTV1 Post Cerebellum 50m PTV2 Lt Cerebellum 156mPTV3 Lo Rt Vent 68m69mTV4 Rt Frontal 5mm5mV5 Lt Frontal Operc 5mm 87m6 Lt Sylv 5mm  70m1/2020-12/20/2018: The patient's right supraclavicular nodes were treated to 60 Gy over 25 fractions.  11/30/17-01/11/18:  66 Gy to the right chest and regional nodes over 33 fractions  NARRATIVE: Ms. AndersWachterwell-known 64 y.o20 patient to our service with a history of adenocarcinoma of the right lower lobe.  When she was originally diagnosed in the summer 2019 she presented with local regional disease and received chemoradiation.  Unfortunately she recurred in the summer 2020, she did receive palliative radiotherapy to the right supraclavicular node, and in November 2020 was found to have 6 lesions in the brain felt to be metastatic for which she underwent SRS treatment in November 2020. She has been receiving   systemic therapy with carboplatin/alimta and Keytruda, though it appears carboplatin has been held since after cycle 4. She is contacted today to review her MRI results from 12/08/19 that did not reveal new disease, rather stability in most, but two areas of radiation related inflammatory change in the left lateral cerebellum measuring 4 x 8 mm, and 3 mm in the right inferior frontal lobe.   On review of systems, the patient reports that she is doing well overall. She reports fatigue and feeling somewhat poorly following her chemotherapy. She denies any headaches, dizziness, visual or auditory changes, or balance/gait disturbances. No other complaints are verbalized.  PAST MEDICAL HISTORY:  Past Medical History:  Diagnosis Date  . met lung ca dx'd 09/2017   neck LN and brain 2020  . Rheumatoid [place] ([facility]  [place] Past Surgical History:  Procedure Laterality Date  . BACK SURGERY    . BRONCHIAL NEEDLE ASPIRATION BIOPSY  11/09/2017   Procedure: BRONCHIAL NEEDLE ASPIRATION BIOPSIES;  Surgeon: MannamMarshell Garfinkel Location: [facility];  Service: Cardiopulmonary;;  . ENDOBRONCHIAL ULTRASOUND Bilateral 11/09/2017   Procedure: ENDOBRONCHIAL ULTRASOUND;  Surgeon: MannamMarshell Garfinkel Location: [facility];  Service: Cardiopulmonary;  Laterality: Bilateral;  . NASAL SINUS SURGERY    . NASAL SINUS SURGERY    . TUBAL LIGATION    . VIDEO BRONCHOSCOPY  11/09/2017   Procedure: VIDEO BRONCHOSCOPY;  Surgeon: MannamMarshell Garfinkel Location: [facility];  Service: Cardiopulmonary;;    PAST SOCIAL HISTORY:  Social History   Socioeconomic History  . Marital status: Divorced    Spouse name: Not on file  . Number of children:  Not on file  . Years of education: Not on file  . Highest education level: Not on file  Occupational History  . Not on file  Tobacco Use  . Smoking status: Former Smoker    Packs/day: 0.50    Years: 45.00    Pack years: 22.50    Types: Cigarettes     Quit date: 09/29/2017    Years since quitting: 2.2  . Smokeless tobacco: Never Used  Vaping Use  . Vaping Use: Never used  Substance and Sexual Activity  . Alcohol use: Not Currently  . Drug use: No  . Sexual activity: Not Currently  Other Topics Concern  . Not on file  Social History Narrative  . Not on file   Social Determinants of Health   Financial Resource Strain:   . Difficulty of Paying Living Expenses:   Food Insecurity:   . Worried About Charity fundraiser in the Last Year:   . Arboriculturist in the Last Year:   Transportation Needs:   . Film/video editor (Medical):   Marland Kitchen Lack of Transportation (Non-Medical):   Physical Activity:   . Days of Exercise per Week:   . Minutes of Exercise per Session:   Stress:   . Feeling of Stress :   Social Connections:   . Frequency of Communication with Friends and Family:   . Frequency of Social Gatherings with Friends and Family:   . Attends Religious Services:   . Active Member of Clubs or Organizations:   . Attends Archivist Meetings:   Marland Kitchen Marital Status:   Intimate Partner Violence:   . Fear of Current or Ex-Partner:   . Emotionally Abused:   Marland Kitchen Physically Abused:   . Sexually Abused:   The patient is divorced.  She lives in [place] and her soon to be 75 year old granddaughter is also at home with her.  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  PAST FAMILY HISTORY: Family History  Problem Relation Age of Onset  . Stroke Mother   . Alzheimer's disease Mother   . Heart disease Mother   . Emphysema Father   . Hypertension Brother   . Heart attack Maternal Aunt   . Heart failure Maternal Grandmother   . Hypertension Paternal Grandmother     MEDICATIONS  Current Outpatient Medications  Medication Sig Dispense Refill  . aspirin EC 81 MG tablet Take 81 mg by mouth daily.    . diclofenac sodium (VOLTAREN) 1 % GEL Apply 2 g topically 4 (four) times daily.    . folic acid (FOLVITE) 1 MG tablet  Take 1 tablet (1 mg total) by mouth daily. 30 tablet 4  . hydroxychloroquine (PLAQUENIL) 200 MG tablet TAKE 1 TABLET BY MOUTH TWICE DAILY MONDAY-FRIDAY 120 tablet 0  . naproxen sodium (ALEVE) 220 MG tablet Take 220 mg by mouth daily as needed (PAIN).    Marland Kitchen omeprazole (PRILOSEC) 20 MG capsule Take 20 mg by mouth daily.    Marland Kitchen Phenylephrine-DM-GG-APAP (DELSYM COUGH/COLD DAYTIME PO) Take by mouth as needed.     Marland Kitchen VITAMIN D PO Take by mouth.    . prochlorperazine (COMPAZINE) 10 MG tablet TAKE 1 TABLET(10 MG) BY MOUTH EVERY 6 HOURS AS NEEDED FOR NAUSEA OR VOMITING 30 tablet 0  . pseudoephedrine-guaifenesin (MUCINEX D) 60-600 MG 12 hr tablet Take 1 tablet by mouth as needed.  (Patient not taking: Reported on 12/08/2019)    . umeclidinium-vilanterol (ANORO ELLIPTA) 62.5-25 MCG/INH AEPB  Inhale 1 puff into the lungs daily. (Patient not taking: Reported on 12/08/2019) 1 each 0   No current facility-administered medications for this encounter.    ALLERGIES: No Known Allergies  PHYSICAL EXAM:  Unable to assess due to encounter type.   IMPRESSION/PLAN: 1. Progressive MetastaticStage IIIA, cT3N1-2M0 NSCLC, adenocarcinoma of the right lower lobe with brain metastases. The patient is doing well and her MRI was reviewed. She does have some inflammatory changes consistent with the type and timing of prior therapy with radiation. She is clinically doing well but will let us know of any changes that occur. We discussed proceeding with 4 month imaging which she is in agreement with.  She will also continue to follow up with Dr. Julien Nordmann with her systemic therapy. 2. Covid vaccination. The patient is interested in receiving her 3rd dose of the mRNA covid vaccine and I've sent a message to our nursing team about whether she can get this here at the cancer center pharmacy as they've been administering doses here.   Given current concerns for patient exposure during the COVID-19 pandemic, this encounter was conducted via  telephone.  The patient has given verbal consent for this type of encounter. The time spent during this encounter was 30 minutes and 50% of that time was spent in preparation, discussion, and in the coordination of her care. The attendants for this meeting include Shona Simpson, [facility] and Farrel Gobble.  During the encounter, Shona Simpson [facility] was located remotely at home.  NANI INGRAM  was located at home.    Carola Rhine, PAC

## 2019-12-20 ENCOUNTER — Other Ambulatory Visit: Payer: Self-pay | Admitting: Internal Medicine

## 2019-12-22 ENCOUNTER — Encounter: Payer: Self-pay | Admitting: Rheumatology

## 2019-12-22 ENCOUNTER — Ambulatory Visit: Payer: Medicare PPO | Admitting: Rheumatology

## 2019-12-22 ENCOUNTER — Other Ambulatory Visit: Payer: Self-pay

## 2019-12-22 VITALS — BP 127/81 | HR 116 | Resp 13 | Ht 60.0 in | Wt 143.2 lb

## 2019-12-22 DIAGNOSIS — Z79899 Other long term (current) drug therapy: Secondary | ICD-10-CM | POA: Diagnosis not present

## 2019-12-22 DIAGNOSIS — C3491 Malignant neoplasm of unspecified part of right bronchus or lung: Secondary | ICD-10-CM

## 2019-12-22 DIAGNOSIS — M65331 Trigger finger, right middle finger: Secondary | ICD-10-CM | POA: Diagnosis not present

## 2019-12-22 DIAGNOSIS — M65332 Trigger finger, left middle finger: Secondary | ICD-10-CM

## 2019-12-22 DIAGNOSIS — M0579 Rheumatoid arthritis with rheumatoid factor of multiple sites without organ or systems involvement: Secondary | ICD-10-CM | POA: Diagnosis not present

## 2019-12-22 DIAGNOSIS — E559 Vitamin D deficiency, unspecified: Secondary | ICD-10-CM

## 2019-12-22 DIAGNOSIS — M5030 Other cervical disc degeneration, unspecified cervical region: Secondary | ICD-10-CM | POA: Diagnosis not present

## 2019-12-22 DIAGNOSIS — C3431 Malignant neoplasm of lower lobe, right bronchus or lung: Secondary | ICD-10-CM

## 2019-12-22 NOTE — Patient Instructions (Signed)

## 2019-12-24 ENCOUNTER — Other Ambulatory Visit: Payer: Self-pay | Admitting: Internal Medicine

## 2019-12-26 ENCOUNTER — Other Ambulatory Visit: Payer: Self-pay

## 2019-12-26 ENCOUNTER — Ambulatory Visit (HOSPITAL_COMMUNITY)
Admission: RE | Admit: 2019-12-26 | Discharge: 2019-12-26 | Disposition: A | Discharge status: Home / Self Care | Payer: Medicare PPO | Source: Ambulatory Visit | Attending: Internal Medicine | Admitting: Internal Medicine

## 2019-12-26 ENCOUNTER — Encounter (HOSPITAL_COMMUNITY): Payer: Self-pay

## 2019-12-26 DIAGNOSIS — C3490 Malignant neoplasm of unspecified part of unspecified bronchus or lung: Secondary | ICD-10-CM | POA: Insufficient documentation

## 2019-12-26 MED ORDER — IOHEXOL 300 MG/ML  SOLN
100.0000 mL | Freq: Once | INTRAMUSCULAR | Status: AC | PRN
  Administered 2019-12-26: 100 mL via INTRAVENOUS

## 2019-12-26 MED ORDER — SODIUM CHLORIDE (PF) 0.9 % IJ SOLN
INTRAMUSCULAR | Status: AC
  Filled 2019-12-26: qty 50

## 2019-12-27 ENCOUNTER — Other Ambulatory Visit: Payer: Self-pay | Admitting: Internal Medicine

## 2019-12-27 ENCOUNTER — Other Ambulatory Visit: Payer: Self-pay

## 2019-12-27 ENCOUNTER — Inpatient Hospital Stay: Payer: Medicare PPO

## 2019-12-27 ENCOUNTER — Inpatient Hospital Stay: Payer: Medicare PPO | Admitting: Internal Medicine

## 2019-12-27 ENCOUNTER — Encounter: Payer: Self-pay | Admitting: Internal Medicine

## 2019-12-27 VITALS — HR 106

## 2019-12-27 VITALS — BP 129/89 | HR 121 | Temp 98.8°F | Resp 18 | Ht 60.0 in | Wt 139.4 lb

## 2019-12-27 DIAGNOSIS — C7931 Secondary malignant neoplasm of brain: Secondary | ICD-10-CM | POA: Diagnosis not present

## 2019-12-27 DIAGNOSIS — C3431 Malignant neoplasm of lower lobe, right bronchus or lung: Secondary | ICD-10-CM

## 2019-12-27 DIAGNOSIS — C3491 Malignant neoplasm of unspecified part of right bronchus or lung: Secondary | ICD-10-CM

## 2019-12-27 DIAGNOSIS — C770 Secondary and unspecified malignant neoplasm of lymph nodes of head, face and neck: Secondary | ICD-10-CM | POA: Diagnosis not present

## 2019-12-27 DIAGNOSIS — E860 Dehydration: Secondary | ICD-10-CM

## 2019-12-27 DIAGNOSIS — Z5111 Encounter for antineoplastic chemotherapy: Secondary | ICD-10-CM

## 2019-12-27 DIAGNOSIS — Z5112 Encounter for antineoplastic immunotherapy: Secondary | ICD-10-CM

## 2019-12-27 LAB — CBC WITH DIFFERENTIAL (CANCER CENTER ONLY)
Abs Immature Granulocytes: 0.02 10*3/uL (ref 0.00–0.07)
Basophils Absolute: 0 10*3/uL (ref 0.0–0.1)
Basophils Relative: 1 %
Eosinophils Absolute: 0.1 10*3/uL (ref 0.0–0.5)
Eosinophils Relative: 1 %
HCT: 32.6 % — ABNORMAL LOW (ref 36.0–46.0)
Hemoglobin: 10.7 g/dL — ABNORMAL LOW (ref 12.0–15.0)
Immature Granulocytes: 0 %
Lymphocytes Relative: 11 %
Lymphs Abs: 0.9 10*3/uL (ref 0.7–4.0)
MCH: 34 pg (ref 26.0–34.0)
MCHC: 32.8 g/dL (ref 30.0–36.0)
MCV: 103.5 fL — ABNORMAL HIGH (ref 80.0–100.0)
Monocytes Absolute: 1 10*3/uL (ref 0.1–1.0)
Monocytes Relative: 13 %
Neutro Abs: 5.7 10*3/uL (ref 1.7–7.7)
Neutrophils Relative %: 74 %
Platelet Count: 388 10*3/uL (ref 150–400)
RBC: 3.15 MIL/uL — ABNORMAL LOW (ref 3.87–5.11)
RDW: 14.9 % (ref 11.5–15.5)
WBC Count: 7.7 10*3/uL (ref 4.0–10.5)
nRBC: 0 % (ref 0.0–0.2)

## 2019-12-27 LAB — CMP (CANCER CENTER ONLY)
ALT: 14 U/L (ref 0–44)
AST: 36 U/L (ref 15–41)
Albumin: 3.7 g/dL (ref 3.5–5.0)
Alkaline Phosphatase: 84 U/L (ref 38–126)
Anion gap: 13 (ref 5–15)
BUN: 9 mg/dL (ref 8–23)
CO2: 22 mmol/L (ref 22–32)
Calcium: 10.9 mg/dL — ABNORMAL HIGH (ref 8.9–10.3)
Chloride: 105 mmol/L (ref 98–111)
Creatinine: 1.02 mg/dL — ABNORMAL HIGH (ref 0.44–1.00)
GFR, Est AFR Am: >60 mL/min (ref >60)
GFR, Estimated: 57 mL/min — ABNORMAL LOW (ref >60)
Glucose, Bld: 105 mg/dL — ABNORMAL HIGH (ref 70–99)
Potassium: 3.9 mmol/L (ref 3.5–5.1)
Sodium: 140 mmol/L (ref 135–145)
Total Bilirubin: 0.4 mg/dL (ref 0.3–1.2)
Total Protein: 8.2 g/dL — ABNORMAL HIGH (ref 6.5–8.1)

## 2019-12-27 LAB — TSH: TSH: 2.083 u[IU]/mL (ref 0.308–3.960)

## 2019-12-27 MED ORDER — SODIUM CHLORIDE 0.9 % IV SOLN
500.0000 mg/m2 | Freq: Once | INTRAVENOUS | Status: AC
  Administered 2019-12-27: 800 mg via INTRAVENOUS
  Filled 2019-12-27: qty 20

## 2019-12-27 MED ORDER — SODIUM CHLORIDE 0.9 % IV SOLN
Freq: Once | INTRAVENOUS | Status: AC
  Filled 2019-12-27: qty 250

## 2019-12-27 MED ORDER — PROCHLORPERAZINE MALEATE 10 MG PO TABS
10.0000 mg | ORAL_TABLET | Freq: Once | ORAL | Status: AC
  Administered 2019-12-27: 10 mg via ORAL

## 2019-12-27 MED ORDER — SODIUM CHLORIDE 0.9 % IV SOLN
200.0000 mg | Freq: Once | INTRAVENOUS | Status: AC
  Administered 2019-12-27: 200 mg via INTRAVENOUS
  Filled 2019-12-27: qty 8

## 2019-12-27 MED ORDER — PROCHLORPERAZINE MALEATE 10 MG PO TABS
ORAL_TABLET | ORAL | Status: AC
  Filled 2019-12-27: qty 1

## 2019-12-27 MED ORDER — SODIUM CHLORIDE 0.9 % IV SOLN
INTRAVENOUS | Status: AC
  Filled 2019-12-27 (×2): qty 250

## 2019-12-27 NOTE — Patient Instructions (Signed)
[facility] Discharge Instructions for Patients Receiving Chemotherapy  Today you received the following chemotherapy agents Keytruda and Alimta  To help prevent nausea and vomiting after your treatment, we encourage you to take your nausea medication as directed.   If you develop nausea and vomiting that is not controlled by your nausea medication, call the clinic.   BELOW ARE SYMPTOMS THAT SHOULD BE REPORTED IMMEDIATELY:  *FEVER GREATER THAN 100.5 F  *CHILLS WITH OR WITHOUT FEVER  NAUSEA AND VOMITING THAT IS NOT CONTROLLED WITH YOUR NAUSEA MEDICATION  *UNUSUAL SHORTNESS OF BREATH  *UNUSUAL BRUISING OR BLEEDING  TENDERNESS IN MOUTH AND THROAT WITH OR WITHOUT PRESENCE OF ULCERS  *URINARY PROBLEMS  *BOWEL PROBLEMS  UNUSUAL RASH Items with * indicate a potential emergency and should be followed up as soon as possible.  Feel free to call the clinic should you have any questions or concerns. The clinic phone number is [phone].  Please show the [name] at check-in to the Emergency Department and triage nurse.

## 2019-12-27 NOTE — Progress Notes (Signed)
[place] [phone]   [phone]) [phone]  OFFICE PROGRESS NOTE  Patient, No Pcp Per No address on file  DIAGNOSIS: Metastatic non-small cell lung cancer initially diagnosed as stage IIIA (T3, N1/N2, M0) non-small cell lung cancer, adenocarcinoma presented with large right lower lobe lung mass with extension to the right hilum and subcarinal area diagnosed in July 2019.  She has brain metastasis in October 2020.  Biomarker Findings Tumor Mutational Burden - TMB-Intermediate (6 Muts/Mb) Microsatellite status - MS-Stable Genomic Findings For a complete list of the genes assayed, please refer to the Appendix. NRAS Q61R ARAF amplification STK11 G56W KRAS G13D MYCN amplification MCL1 amplification NKX2-1 amplification - equivocal? TP53 G245V 7 Disease relevant genes with no reportable alterations: EGFR, ALK, BRAF, MET, ERBB2, RET, ROS1   PRIOR THERAPY:  1) Course of concurrent chemoradiation with weekly carboplatin for AUC of 2 and paclitaxel 45 mg/M2.  Status post 7 cycles.  Last dose was giving 01/11/2018. 2) Consolidation treatment with immunotherapy with Imfinzi (Durvalumab) 10 mg/KG every 2 weeks.  First dose February 09, 2018.  Status post 19 cycles. 3) status post stereotactic body radiotherapy to the enlarging right supraclavicular lymphadenopathy under the care of Dr. Lisbeth Renshaw. 4) SRS to multiple brain metastasis under the care of Dr. Lisbeth Renshaw.  CURRENT THERAPY: Systemic chemotherapy with carboplatin for AUC of 5, Alimta 500 mg/M2 and Keytruda 200 mg IV every 3 weeks.  First dose 08/16/2019.  Status post 6 cycles.  Starting from cycle #5 the patient is on maintenance treatment with Alimta and Keytruda every 3 weeks.  INTERVAL HISTORY: Susan Holder 64 y.o. female returns to the clinic today for follow-up visit.  The patient is feeling fine today with no concerning complaints.  She denied having any chest pain, shortness of breath, cough or  hemoptysis.  She denied having any fever or chills.  She has no nausea, vomiting, diarrhea or constipation.  She has no headache or visual changes.  She denied having any recent weight loss or night sweats.  She has been tolerating her maintenance treatment with Keytruda and Alimta fairly well.  She had repeat CT scan of the chest, abdomen pelvis performed recently and she is here for evaluation and discussion of her scan results.  MEDICAL HISTORY: Past Medical History:  Diagnosis Date  . met lung ca dx'd 09/2017   neck LN and brain 2020  . Rheumatoid arthritis ([place])     ALLERGIES:  has No Known Allergies.  MEDICATIONS:  Current Outpatient Medications  Medication Sig Dispense Refill  . aspirin EC 81 MG tablet Take 81 mg by mouth daily.    . diclofenac sodium (VOLTAREN) 1 % GEL Apply 2 g topically 4 (four) times daily.    . folic acid (FOLVITE) 1 MG tablet Take 1 tablet (1 mg total) by mouth daily. 30 tablet 4  . hydroxychloroquine (PLAQUENIL) 200 MG tablet TAKE 1 TABLET BY MOUTH TWICE DAILY MONDAY-FRIDAY 120 tablet 0  . naproxen sodium (ALEVE) 220 MG tablet Take 220 mg by mouth daily as needed (PAIN).    Marland Kitchen omeprazole (PRILOSEC) 20 MG capsule Take 20 mg by mouth daily.    Marland Kitchen Phenylephrine-DM-GG-APAP (DELSYM COUGH/COLD DAYTIME PO) Take by mouth as needed.     . prochlorperazine (COMPAZINE) 10 MG tablet TAKE 1 TABLET(10 MG) BY MOUTH EVERY 6 HOURS AS NEEDED FOR NAUSEA OR VOMITING 30 tablet 0  . pseudoephedrine-guaifenesin (MUCINEX D) 60-600 MG 12 hr tablet Take 1 tablet by mouth as  needed.     Marland Kitchen VITAMIN D PO Take by mouth.     No current facility-administered medications for this visit.    SURGICAL HISTORY:  Past Surgical History:  Procedure Laterality Date  . BACK SURGERY    . BRONCHIAL NEEDLE ASPIRATION BIOPSY  11/09/2017   Procedure: BRONCHIAL NEEDLE ASPIRATION BIOPSIES;  Surgeon: Marshell Garfinkel, MD;  Location: [facility];  Service: Cardiopulmonary;;  . ENDOBRONCHIAL ULTRASOUND  Bilateral 11/09/2017   Procedure: ENDOBRONCHIAL ULTRASOUND;  Surgeon: Marshell Garfinkel, MD;  Location: [facility];  Service: Cardiopulmonary;  Laterality: Bilateral;  . NASAL SINUS SURGERY    . NASAL SINUS SURGERY    . TUBAL LIGATION    . VIDEO BRONCHOSCOPY  11/09/2017   Procedure: VIDEO BRONCHOSCOPY;  Surgeon: Marshell Garfinkel, MD;  Location: [facility];  Service: Cardiopulmonary;;    REVIEW OF SYSTEMS:  Constitutional: positive for fatigue Eyes: negative Ears, nose, mouth, throat, and face: negative Respiratory: negative Cardiovascular: negative Gastrointestinal: negative Genitourinary:negative Integument/breast: negative Hematologic/lymphatic: negative Musculoskeletal:negative Neurological: negative Behavioral/Psych: negative Endocrine: negative Allergic/Immunologic: negative   PHYSICAL EXAMINATION: General appearance: alert, cooperative, fatigued and no distress Head: Normocephalic, without obvious abnormality, atraumatic Neck: no adenopathy, no JVD, supple, symmetrical, trachea midline and thyroid not enlarged, symmetric, no tenderness/mass/nodules Lymph nodes: Cervical, supraclavicular, and axillary nodes normal. Resp: clear to auscultation bilaterally Back: symmetric, no curvature. ROM normal. No CVA tenderness. Cardio: regular rate and rhythm, S1, S2 normal, no murmur, click, rub or gallop GI: soft, non-tender; bowel sounds normal; no masses,  no organomegaly Extremities: extremities normal, atraumatic, no cyanosis or edema Neurologic: Alert and oriented X 3, normal strength and tone. Normal symmetric reflexes. Normal coordination and gait  ECOG PERFORMANCE STATUS: 1 - Symptomatic but completely ambulatory  Blood pressure 129/89, pulse (!) 121, temperature 98.8 F (37.1 C), temperature source Tympanic, resp. rate 18, height 5' (1.524 m), weight 139 lb 6.4 oz (63.2 kg), last menstrual period 04/28/2000, SpO2 100 %.  LABORATORY DATA: Lab Results  Component Value Date     WBC 5.2 12/06/2019   HGB 10.4 (L) 12/06/2019   HCT 31.6 (L) 12/06/2019   MCV 103.3 (H) 12/06/2019   PLT 335 12/06/2019      Chemistry      Component Value Date/Time   NA 139 12/06/2019 1145   K 4.0 12/06/2019 1145   CL 108 12/06/2019 1145   CO2 22 12/06/2019 1145   BUN 12 12/06/2019 1145   CREATININE 0.96 12/06/2019 1145   CREATININE 0.80 07/24/2017 1438      Component Value Date/Time   CALCIUM 9.9 12/06/2019 1145   ALKPHOS 88 12/06/2019 1145   AST 41 12/06/2019 1145   ALT 25 12/06/2019 1145   BILITOT 0.3 12/06/2019 1145       RADIOGRAPHIC STUDIES: CT Chest W Contrast  Result Date: 12/26/2019 CLINICAL DATA:  Non-small cell lung cancer staging, completed radiotherapy, currently on immunotherapy. EXAM: CT CHEST, ABDOMEN, AND PELVIS WITH CONTRAST TECHNIQUE: Multidetector CT imaging of the chest, abdomen and pelvis was performed following the standard protocol during bolus administration of intravenous contrast. CONTRAST:  189m OMNIPAQUE IOHEXOL 300 MG/ML  SOLN COMPARISON:  October 14, 2019 FINDINGS: CT CHEST FINDINGS Cardiovascular: Calcified and noncalcified atheromatous plaque in the thoracic aorta. Heart size is stable with small pericardial effusion, unchanged from previous imaging. Central pulmonary arteries unremarkable on venous phase assessment. Mediastinum/Nodes: Thoracic inlet structures are unremarkable. Soft tissue about the RIGHT hilum with similar appearance to prior imaging with further consolidative changes in the lung about the hilum. No  discrete adenopathy in the mediastinum. No axillary lymphadenopathy. Mildly patulous and mildly thickened esophagus. Lungs/Pleura: Pulmonary emphysema. Volume loss related to post treatment changes with some increase about the RIGHT hilum. Distortion of bronchial and vascular structures in the central RIGHT chest. Mild ground-glass and potential septal thickening has developed in the LEFT chest along the major fissure since the prior  study best seen on image 37 of series 7. Subtle fissural nodularity may also be present. This represents a change from the previous study. LEFT chest is clear.  Airways are patent. Musculoskeletal: No chest wall lesion. See below for full musculoskeletal detail. CT ABDOMEN PELVIS FINDINGS Hepatobiliary: No focal, suspicious hepatic lesion. Portal vein is patent into the liver. No pericholecystic stranding or biliary duct distension. Pancreas: Pancreas is normal without ductal dilation or inflammation. Spleen: Spleen normal in size and contour. Adrenals/Urinary Tract: Adrenal glands are normal. Symmetric renal enhancement.  No hydronephrosis. Stomach/Bowel: No acute gastrointestinal process. The appendix is normal. Positive contrast is passed through the gastrointestinal tract into the ascending colon. Vascular/Lymphatic: Calcified and noncalcified atheromatous plaque of the abdominal aorta without aneurysmal dilation. No retroperitoneal adenopathy. No pelvic lymphadenopathy. Reproductive: Post bilateral tubal ligation with clips as on the previous exam. No adnexal mass. Unremarkable appearance of the uterus and adnexa by CT. Other: No ascites. Musculoskeletal: Spinal degenerative changes. No acute bone finding. No destructive bone process. IMPRESSION: 1. Post treatment changes in the RIGHT chest without change, without definite signs of disease recurrence. 2. Ground-glass and subtle septal thickening with potential mild fissural nodularity in the LEFT chest along the superior aspect of the major fissure. This may be infectious or inflammatory, consider correlation with any respiratory symptoms. Developing lymphangitic carcinomatosis is also possible though follow-up will be necessary to determine significance. 3. No evidence of metastatic disease in the abdomen or pelvis. 4. Small pericardial effusion, unchanged from previous imaging. 5. Emphysema and aortic atherosclerosis. Aortic Atherosclerosis (ICD10-I70.0) and  Emphysema (ICD10-J43.9). Electronically Signed   By: Zetta Bills M.D.   On: 12/26/2019 13:19   MR Brain W Wo Contrast  Result Date: 12/08/2019 CLINICAL DATA:  Follow-up brain metastasis.  Lung cancer. EXAM: MRI HEAD WITHOUT AND WITH CONTRAST TECHNIQUE: Multiplanar, multiecho pulse sequences of the brain and surrounding structures were obtained without and with intravenous contrast. CONTRAST:  13m MULTIHANCE GADOBENATE DIMEGLUMINE 529 MG/ML IV SOLN COMPARISON:  MRI hand 09/07/2019, 06/09/2019 FINDINGS: Brain: Enhancing metastatic deposits are present in the brain. 4 mm lesion left posterior cerebellum slightly smaller Stable 3 mm lesion in the right medial cerebellum axial image 36. 2 mm lesion in the left middle frontal gyrus slightly better seen but approximately the same size Previously treated lesion left posterior insula no longer visualized Left lateral cerebellar lesion appears larger with additional area of adjacent enhancement laterally. Overall this lesion now measures approximately 4 x 8 mm 3 mm lesion in the right inferior frontal lobe anterior to the middle cerebral artery appears slightly larger. Ventricle size normal. No acute infarct. Scattered white matter hyperintensities bilaterally are stable. Negative for intracranial hemorrhage Vascular: Normal arterial flow voids Skull and upper cervical spine: No focal skeletal lesion identified. Sinuses/Orbits: Mild mucosal edema paranasal sinuses. Left mastoid effusion. Normal orbit Other: None IMPRESSION: Multiple treated metastatic deposits in the brain. Left lateral cerebellar lesion appears larger now measuring 4 x 8 mm. 3 mm lesion right inferior frontal lobe appears larger. Remaining lesions are stable or improved. Electronically Signed   By: CFranchot GalloM.D.   On: 12/08/2019 14:28  CT Abdomen Pelvis W Contrast  Result Date: 12/26/2019 CLINICAL DATA:  Non-small cell lung cancer staging, completed radiotherapy, currently on  immunotherapy. EXAM: CT CHEST, ABDOMEN, AND PELVIS WITH CONTRAST TECHNIQUE: Multidetector CT imaging of the chest, abdomen and pelvis was performed following the standard protocol during bolus administration of intravenous contrast. CONTRAST:  168m OMNIPAQUE IOHEXOL 300 MG/ML  SOLN COMPARISON:  October 14, 2019 FINDINGS: CT CHEST FINDINGS Cardiovascular: Calcified and noncalcified atheromatous plaque in the thoracic aorta. Heart size is stable with small pericardial effusion, unchanged from previous imaging. Central pulmonary arteries unremarkable on venous phase assessment. Mediastinum/Nodes: Thoracic inlet structures are unremarkable. Soft tissue about the RIGHT hilum with similar appearance to prior imaging with further consolidative changes in the lung about the hilum. No discrete adenopathy in the mediastinum. No axillary lymphadenopathy. Mildly patulous and mildly thickened esophagus. Lungs/Pleura: Pulmonary emphysema. Volume loss related to post treatment changes with some increase about the RIGHT hilum. Distortion of bronchial and vascular structures in the central RIGHT chest. Mild ground-glass and potential septal thickening has developed in the LEFT chest along the major fissure since the prior study best seen on image 37 of series 7. Subtle fissural nodularity may also be present. This represents a change from the previous study. LEFT chest is clear.  Airways are patent. Musculoskeletal: No chest wall lesion. See below for full musculoskeletal detail. CT ABDOMEN PELVIS FINDINGS Hepatobiliary: No focal, suspicious hepatic lesion. Portal vein is patent into the liver. No pericholecystic stranding or biliary duct distension. Pancreas: Pancreas is normal without ductal dilation or inflammation. Spleen: Spleen normal in size and contour. Adrenals/Urinary Tract: Adrenal glands are normal. Symmetric renal enhancement.  No hydronephrosis. Stomach/Bowel: No acute gastrointestinal process. The appendix is normal.  Positive contrast is passed through the gastrointestinal tract into the ascending colon. Vascular/Lymphatic: Calcified and noncalcified atheromatous plaque of the abdominal aorta without aneurysmal dilation. No retroperitoneal adenopathy. No pelvic lymphadenopathy. Reproductive: Post bilateral tubal ligation with clips as on the previous exam. No adnexal mass. Unremarkable appearance of the uterus and adnexa by CT. Other: No ascites. Musculoskeletal: Spinal degenerative changes. No acute bone finding. No destructive bone process. IMPRESSION: 1. Post treatment changes in the RIGHT chest without change, without definite signs of disease recurrence. 2. Ground-glass and subtle septal thickening with potential mild fissural nodularity in the LEFT chest along the superior aspect of the major fissure. This may be infectious or inflammatory, consider correlation with any respiratory symptoms. Developing lymphangitic carcinomatosis is also possible though follow-up will be necessary to determine significance. 3. No evidence of metastatic disease in the abdomen or pelvis. 4. Small pericardial effusion, unchanged from previous imaging. 5. Emphysema and aortic atherosclerosis. Aortic Atherosclerosis (ICD10-I70.0) and Emphysema (ICD10-J43.9). Electronically Signed   By: GZetta BillsM.D.   On: 12/26/2019 13:19    ASSESSMENT AND PLAN: This is a very pleasant 64years old African-American female with metastatic non-small cell lung cancer initially diagnosed with a stage IIIA non-small cell lung cancer, adenocarcinoma.  She underwent a course of concurrent chemoradiation with weekly carboplatin and paclitaxel status post 7 cycles with partial response.   The patient tolerated this course of treatment well except for mild odynophagia and dysphagia. She completed on consolidation treatment with immunotherapy with Imfinzi (Durvalumab) status post 18 cycles. She also completed SBRT to the right supraclavicular  lymphadenopathy. The patient had evidence for multiple brain metastasis in October 2020 and she underwent SRS treatment to this lesion under the care of Dr. MLisbeth Renshaw The patient had evidence for disease  progression and she started systemic chemotherapy with carboplatin, Alimta and Keytruda status post 6 cycles.  Starting from cycle #5 she is on maintenance treatment with Alimta and Keytruda every 3 weeks. The patient continues to tolerate this treatment fairly well. She had repeat CT scan of the chest, abdomen pelvis performed recently.  I personally and independently reviewed the scans and discussed the results with the patient today. Her scan showed no concerning findings for disease progression. I recommended for her to continue her current maintenance treatment with Alimta and Keytruda and she will proceed with cycle #7 today. For the tachycardia, the patient is very anxious about her scan results.  We will check her EKG to rule out any other underlying cardiac abnormalities.  If the patient has sinus rhythm, we will proceed with her treatment as planned and we may consider her for additional IV hydration with normal saline. For the dry mouth she will continue with Biotene as needed. The patient will come back for follow-up visit in 3 weeks for evaluation before the next cycle of her treatment. She was advised to call immediately if she has any other concerning symptoms in the interval. The patient voices understanding of current disease status and treatment options and is in agreement with the current care plan. All questions were answered. The patient knows to call the clinic with any problems, questions or concerns. We can certainly see the patient much sooner if necessary.  Disclaimer: This note was dictated with voice recognition software. Similar sounding words can inadvertently be transcribed and may not be corrected upon review.

## 2019-12-27 NOTE — Progress Notes (Signed)
Per Dr Julien Nordmann it is okay to give pt Alimta and Keytruda today and heart rate of 121/min and it is okay to administer 500 ml normal saline over 30-45 minutes.

## 2019-12-28 ENCOUNTER — Telehealth: Payer: Self-pay | Admitting: Internal Medicine

## 2019-12-28 NOTE — Telephone Encounter (Signed)
Added additional cycle per 8/31 los - pt to get an updated schedule next visit .

## 2020-01-03 ENCOUNTER — Other Ambulatory Visit: Payer: Self-pay | Admitting: Internal Medicine

## 2020-01-17 ENCOUNTER — Other Ambulatory Visit: Payer: Self-pay

## 2020-01-17 ENCOUNTER — Inpatient Hospital Stay: Payer: Medicare PPO | Attending: Internal Medicine

## 2020-01-17 ENCOUNTER — Inpatient Hospital Stay: Payer: Medicare PPO | Admitting: Internal Medicine

## 2020-01-17 ENCOUNTER — Encounter: Payer: Self-pay | Admitting: Internal Medicine

## 2020-01-17 ENCOUNTER — Inpatient Hospital Stay: Payer: Medicare PPO

## 2020-01-17 VITALS — BP 144/71 | HR 120 | Temp 98.2°F | Resp 20 | Ht 60.0 in | Wt 136.6 lb

## 2020-01-17 VITALS — HR 116

## 2020-01-17 DIAGNOSIS — Z923 Personal history of irradiation: Secondary | ICD-10-CM | POA: Diagnosis not present

## 2020-01-17 DIAGNOSIS — R682 Dry mouth, unspecified: Secondary | ICD-10-CM | POA: Insufficient documentation

## 2020-01-17 DIAGNOSIS — C7931 Secondary malignant neoplasm of brain: Secondary | ICD-10-CM | POA: Insufficient documentation

## 2020-01-17 DIAGNOSIS — Z5112 Encounter for antineoplastic immunotherapy: Secondary | ICD-10-CM | POA: Insufficient documentation

## 2020-01-17 DIAGNOSIS — C3431 Malignant neoplasm of lower lobe, right bronchus or lung: Secondary | ICD-10-CM | POA: Insufficient documentation

## 2020-01-17 DIAGNOSIS — Z79899 Other long term (current) drug therapy: Secondary | ICD-10-CM | POA: Diagnosis not present

## 2020-01-17 DIAGNOSIS — Z5111 Encounter for antineoplastic chemotherapy: Secondary | ICD-10-CM | POA: Diagnosis not present

## 2020-01-17 DIAGNOSIS — Z791 Long term (current) use of non-steroidal anti-inflammatories (NSAID): Secondary | ICD-10-CM | POA: Diagnosis not present

## 2020-01-17 DIAGNOSIS — Z7982 Long term (current) use of aspirin: Secondary | ICD-10-CM | POA: Diagnosis not present

## 2020-01-17 DIAGNOSIS — C3491 Malignant neoplasm of unspecified part of right bronchus or lung: Secondary | ICD-10-CM

## 2020-01-17 DIAGNOSIS — M069 Rheumatoid arthritis, unspecified: Secondary | ICD-10-CM | POA: Diagnosis not present

## 2020-01-17 LAB — CBC WITH DIFFERENTIAL (CANCER CENTER ONLY)
Abs Immature Granulocytes: 0.02 10*3/uL (ref 0.00–0.07)
Basophils Absolute: 0 10*3/uL (ref 0.0–0.1)
Basophils Relative: 1 %
Eosinophils Absolute: 0.1 10*3/uL (ref 0.0–0.5)
Eosinophils Relative: 1 %
HCT: 32.3 % — ABNORMAL LOW (ref 36.0–46.0)
Hemoglobin: 10.4 g/dL — ABNORMAL LOW (ref 12.0–15.0)
Immature Granulocytes: 0 %
Lymphocytes Relative: 12 %
Lymphs Abs: 0.7 10*3/uL (ref 0.7–4.0)
MCH: 32.8 pg (ref 26.0–34.0)
MCHC: 32.2 g/dL (ref 30.0–36.0)
MCV: 101.9 fL — ABNORMAL HIGH (ref 80.0–100.0)
Monocytes Absolute: 0.8 10*3/uL (ref 0.1–1.0)
Monocytes Relative: 14 %
Neutro Abs: 4.3 10*3/uL (ref 1.7–7.7)
Neutrophils Relative %: 72 %
Platelet Count: 332 10*3/uL (ref 150–400)
RBC: 3.17 MIL/uL — ABNORMAL LOW (ref 3.87–5.11)
RDW: 13.4 % (ref 11.5–15.5)
WBC Count: 5.9 10*3/uL (ref 4.0–10.5)
nRBC: 0 % (ref 0.0–0.2)

## 2020-01-17 LAB — CMP (CANCER CENTER ONLY)
ALT: 14 U/L (ref 0–44)
AST: 36 U/L (ref 15–41)
Albumin: 3.5 g/dL (ref 3.5–5.0)
Alkaline Phosphatase: 73 U/L (ref 38–126)
Anion gap: 14 (ref 5–15)
BUN: 6 mg/dL — ABNORMAL LOW (ref 8–23)
CO2: 19 mmol/L — ABNORMAL LOW (ref 22–32)
Calcium: 10.3 mg/dL (ref 8.9–10.3)
Chloride: 104 mmol/L (ref 98–111)
Creatinine: 0.92 mg/dL (ref 0.44–1.00)
GFR, Est AFR Am: >60 mL/min (ref >60)
GFR, Estimated: >60 mL/min (ref >60)
Glucose, Bld: 108 mg/dL — ABNORMAL HIGH (ref 70–99)
Potassium: 3.6 mmol/L (ref 3.5–5.1)
Sodium: 137 mmol/L (ref 135–145)
Total Bilirubin: 0.3 mg/dL (ref 0.3–1.2)
Total Protein: 8.2 g/dL — ABNORMAL HIGH (ref 6.5–8.1)

## 2020-01-17 LAB — TSH: TSH: 2.096 u[IU]/mL (ref 0.308–3.960)

## 2020-01-17 MED ORDER — SODIUM CHLORIDE 0.9 % IV SOLN
200.0000 mg | Freq: Once | INTRAVENOUS | Status: AC
  Administered 2020-01-17: 200 mg via INTRAVENOUS
  Filled 2020-01-17: qty 8

## 2020-01-17 MED ORDER — SODIUM CHLORIDE 0.9 % IV SOLN
500.0000 mg/m2 | Freq: Once | INTRAVENOUS | Status: AC
  Administered 2020-01-17: 800 mg via INTRAVENOUS
  Filled 2020-01-17: qty 20

## 2020-01-17 MED ORDER — SODIUM CHLORIDE 0.9 % IV SOLN
Freq: Once | INTRAVENOUS | Status: AC
  Filled 2020-01-17: qty 250

## 2020-01-17 MED ORDER — CYANOCOBALAMIN 1000 MCG/ML IJ SOLN
1000.0000 ug | Freq: Once | INTRAMUSCULAR | Status: AC
  Administered 2020-01-17: 1000 ug via INTRAMUSCULAR

## 2020-01-17 MED ORDER — PROCHLORPERAZINE MALEATE 10 MG PO TABS
10.0000 mg | ORAL_TABLET | Freq: Once | ORAL | Status: AC
  Administered 2020-01-17: 10 mg via ORAL

## 2020-01-17 NOTE — Progress Notes (Signed)
OK to treat-Per Dr Julien Nordmann ,it is okay to treat pt today with Alimta and keytruda and heart rate of 112 .

## 2020-01-17 NOTE — Patient Instructions (Signed)
[facility] Discharge Instructions for Patients Receiving Chemotherapy  Today you received the following chemotherapy agents Keytruda and Alimta  To help prevent nausea and vomiting after your treatment, we encourage you to take your nausea medication as directed.   If you develop nausea and vomiting that is not controlled by your nausea medication, call the clinic.   BELOW ARE SYMPTOMS THAT SHOULD BE REPORTED IMMEDIATELY:  *FEVER GREATER THAN 100.5 F  *CHILLS WITH OR WITHOUT FEVER  NAUSEA AND VOMITING THAT IS NOT CONTROLLED WITH YOUR NAUSEA MEDICATION  *UNUSUAL SHORTNESS OF BREATH  *UNUSUAL BRUISING OR BLEEDING  TENDERNESS IN MOUTH AND THROAT WITH OR WITHOUT PRESENCE OF ULCERS  *URINARY PROBLEMS  *BOWEL PROBLEMS  UNUSUAL RASH Items with * indicate a potential emergency and should be followed up as soon as possible.  Feel free to call the clinic should you have any questions or concerns. The clinic phone number is [phone].  Please show the [facility] at check-in to the Emergency Department and triage nurse.  Cyanocobalamin, Vitamin B12 injection What is this medicine? CYANOCOBALAMIN (sye an oh koe BAL a min) is a man made form of vitamin B12. Vitamin B12 is used in the growth of healthy blood cells, nerve cells, and proteins in the body. It also helps with the metabolism of fats and carbohydrates. This medicine is used to treat people who can not absorb vitamin B12. This medicine may be used for other purposes; ask your health care provider or pharmacist if you have questions. COMMON BRAND NAME(S): B-12 Compliance Kit, B-12 Injection Kit, Cyomin, LA-12, Nutri-Twelve, Physicians EZ Use B-12, Primabalt What should I tell my health care provider before I take this medicine? They need to know if you have any of these conditions: kidney disease Leber's disease megaloblastic anemia an unusual or allergic reaction to cyanocobalamin, cobalt, other  medicines, foods, dyes, or preservatives pregnant or trying to get pregnant breast-feeding How should I use this medicine? This medicine is injected into a muscle or deeply under the skin. It is usually given by a health care professional in a clinic or doctor's office. However, your doctor may teach you how to inject yourself. Follow all instructions. Talk to your pediatrician regarding the use of this medicine in children. Special care may be needed. Overdosage: If you think you have taken too much of this medicine contact a poison control center or emergency room at once. NOTE: This medicine is only for you. Do not share this medicine with others. What if I miss a dose? If you are given your dose at a clinic or doctor's office, call to reschedule your appointment. If you give your own injections and you miss a dose, take it as soon as you can. If it is almost time for your next dose, take only that dose. Do not take double or extra doses. What may interact with this medicine? colchicine heavy alcohol intake This list may not describe all possible interactions. Give your health care provider a list of all the medicines, herbs, non-prescription drugs, or dietary supplements you use. Also tell them if you smoke, drink alcohol, or use illegal drugs. Some items may interact with your medicine. What should I watch for while using this medicine? Visit your doctor or health care professional regularly. You may need blood work done while you are taking this medicine. You may need to follow a special diet. Talk to your doctor. Limit your alcohol intake and avoid smoking to get the best  benefit. What side effects may I notice from receiving this medicine? Side effects that you should report to your doctor or health care professional as soon as possible: allergic reactions like skin rash, itching or hives, swelling of the face, lips, or tongue blue tint to skin chest tightness, pain difficulty  breathing, wheezing dizziness red, swollen painful area on the leg Side effects that usually do not require medical attention (report to your doctor or health care professional if they continue or are bothersome): diarrhea headache This list may not describe all possible side effects. Call your doctor for medical advice about side effects. You may report side effects to FDA at 1-800-FDA-1088. Where should I keep my medicine? Keep out of the reach of children. Store at room temperature between 15 and 30 degrees C (59 and 85 degrees F). Protect from light. Throw away any unused medicine after the expiration date. NOTE: This sheet is a summary. It may not cover all possible information. If you have questions about this medicine, talk to your doctor, pharmacist, or health care provider.  2020 Elsevier/Gold Standard (2007-07-26 22:10:20)

## 2020-01-17 NOTE — Progress Notes (Signed)
[facility] Telephone:(336) [phone]   [phone]  OFFICE PROGRESS NOTE  Patient, No Pcp Per No address on file  DIAGNOSIS: Metastatic non-small cell lung cancer initially diagnosed as stage IIIA (T3, N1/N2, M0) non-small cell lung cancer, adenocarcinoma presented with large right lower lobe lung mass with extension to the right hilum and subcarinal area diagnosed in July 2019.  She has brain metastasis in October 2020.  Biomarker Findings Tumor Mutational Burden - TMB-Intermediate (6 Muts/Mb) Microsatellite status - MS-Stable Genomic Findings For a complete list of the genes assayed, please refer to the Appendix. NRAS Q61R ARAF amplification STK11 G56W KRAS G13D MYCN amplification MCL1 amplification NKX2-1 amplification - equivocal? TP53 G245V 7 Disease relevant genes with no reportable alterations: EGFR, ALK, BRAF, MET, ERBB2, RET, ROS1   PRIOR THERAPY:  1) Course of concurrent chemoradiation with weekly carboplatin for AUC of 2 and paclitaxel 45 mg/M2.  Status post 7 cycles.  Last dose was giving 01/11/2018. 2) Consolidation treatment with immunotherapy with Imfinzi (Durvalumab) 10 mg/KG every 2 weeks.  First dose February 09, 2018.  Status post 19 cycles. 3) status post stereotactic body radiotherapy to the enlarging right supraclavicular lymphadenopathy under the care of Dr. Lisbeth Renshaw. 4) SRS to multiple brain metastasis under the care of Dr. Lisbeth Renshaw.  CURRENT THERAPY: Systemic chemotherapy with carboplatin for AUC of 5, Alimta 500 mg/M2 and Keytruda 200 mg IV every 3 weeks.  First dose 08/16/2019.  Status post 7 cycles.  Starting from cycle #5 the patient is on maintenance treatment with Alimta and Keytruda every 3 weeks.  INTERVAL HISTORY: Susan Holder 64 y.o. female returns to the clinic today for follow-up visit.  The patient is feeling fine today with no concerning complaints except for the dry mouth.  She denied having any chest pain, shortness of  breath, cough or hemoptysis.  She denied having any fever or chills.  She has no nausea, vomiting, diarrhea or constipation.  She has no headache or visual changes.  She continues to tolerate her treatment with Alimta and Keytruda fairly well.  The patient is here today for evaluation before starting cycle #8 of her treatment.   MEDICAL HISTORY: Past Medical History:  Diagnosis Date  . met lung ca dx'd 09/2017   neck LN and brain 2020  . Rheumatoid arthritis ([place])     ALLERGIES:  has No Known Allergies.  MEDICATIONS:  Current Outpatient Medications  Medication Sig Dispense Refill  . aspirin EC 81 MG tablet Take 81 mg by mouth daily.    . diclofenac sodium (VOLTAREN) 1 % GEL Apply 2 g topically 4 (four) times daily.    . folic acid (FOLVITE) 1 MG tablet Take 1 tablet (1 mg total) by mouth daily. 30 tablet 4  . hydroxychloroquine (PLAQUENIL) 200 MG tablet TAKE 1 TABLET BY MOUTH TWICE DAILY MONDAY-FRIDAY 120 tablet 0  . naproxen sodium (ALEVE) 220 MG tablet Take 220 mg by mouth daily as needed (PAIN).    Marland Kitchen omeprazole (PRILOSEC) 20 MG capsule Take 20 mg by mouth daily.    Marland Kitchen Phenylephrine-DM-GG-APAP (DELSYM COUGH/COLD DAYTIME PO) Take by mouth as needed.     . prochlorperazine (COMPAZINE) 10 MG tablet TAKE 1 TABLET(10 MG) BY MOUTH EVERY 6 HOURS AS NEEDED FOR NAUSEA OR VOMITING 30 tablet 0  . pseudoephedrine-guaifenesin (MUCINEX D) 60-600 MG 12 hr tablet Take 1 tablet by mouth as needed.     Marland Kitchen VITAMIN D PO Take by mouth.  No current facility-administered medications for this visit.    SURGICAL HISTORY:  Past Surgical History:  Procedure Laterality Date  . BACK SURGERY    . BRONCHIAL NEEDLE ASPIRATION BIOPSY  11/09/2017   Procedure: BRONCHIAL NEEDLE ASPIRATION BIOPSIES;  Surgeon: Marshell Garfinkel, MD;  Location: [facility];  Service: Cardiopulmonary;;  . ENDOBRONCHIAL ULTRASOUND Bilateral 11/09/2017   Procedure: ENDOBRONCHIAL ULTRASOUND;  Surgeon: Marshell Garfinkel, MD;  Location: [facility];  Service: Cardiopulmonary;  Laterality: Bilateral;  . NASAL SINUS SURGERY    . NASAL SINUS SURGERY    . TUBAL LIGATION    . VIDEO BRONCHOSCOPY  11/09/2017   Procedure: VIDEO BRONCHOSCOPY;  Surgeon: Marshell Garfinkel, MD;  Location: [facility];  Service: Cardiopulmonary;;    REVIEW OF SYSTEMS:  A comprehensive review of systems was negative except for: Ears, nose, mouth, throat, and face: positive for Dry mouth   PHYSICAL EXAMINATION: General appearance: alert, cooperative and no distress Head: Normocephalic, without obvious abnormality, atraumatic Neck: no adenopathy, no JVD, supple, symmetrical, trachea midline and thyroid not enlarged, symmetric, no tenderness/mass/nodules Lymph nodes: Cervical, supraclavicular, and axillary nodes normal. Resp: clear to auscultation bilaterally Back: symmetric, no curvature. ROM normal. No CVA tenderness. Cardio: regular rate and rhythm, S1, S2 normal, no murmur, click, rub or gallop GI: soft, non-tender; bowel sounds normal; no masses,  no organomegaly Extremities: extremities normal, atraumatic, no cyanosis or edema  ECOG PERFORMANCE STATUS: 1 - Symptomatic but completely ambulatory  Blood pressure (!) 144/71, pulse (!) 120, temperature 98.2 F (36.8 C), temperature source Tympanic, resp. rate 20, height 5' (1.524 m), weight 136 lb 9.6 oz (62 kg), last menstrual period 04/28/2000, SpO2 100 %.  LABORATORY DATA: Lab Results  Component Value Date   WBC 5.9 01/17/2020   HGB 10.4 (L) 01/17/2020   HCT 32.3 (L) 01/17/2020   MCV 101.9 (H) 01/17/2020   PLT 332 01/17/2020      Chemistry      Component Value Date/Time   NA 137 01/17/2020 0805   K 3.6 01/17/2020 0805   CL 104 01/17/2020 0805   CO2 19 (L) 01/17/2020 0805   BUN 6 (L) 01/17/2020 0805   CREATININE 0.92 01/17/2020 0805   CREATININE 0.80 07/24/2017 1438      Component Value Date/Time   CALCIUM 10.3 01/17/2020 0805   ALKPHOS 73 01/17/2020 0805   AST 36 01/17/2020 0805    ALT 14 01/17/2020 0805   BILITOT 0.3 01/17/2020 0805       RADIOGRAPHIC STUDIES: CT Chest W Contrast  Result Date: 12/26/2019 CLINICAL DATA:  Non-small cell lung cancer staging, completed radiotherapy, currently on immunotherapy. EXAM: CT CHEST, ABDOMEN, AND PELVIS WITH CONTRAST TECHNIQUE: Multidetector CT imaging of the chest, abdomen and pelvis was performed following the standard protocol during bolus administration of intravenous contrast. CONTRAST:  158m OMNIPAQUE IOHEXOL 300 MG/ML  SOLN COMPARISON:  October 14, 2019 FINDINGS: CT CHEST FINDINGS Cardiovascular: Calcified and noncalcified atheromatous plaque in the thoracic aorta. Heart size is stable with small pericardial effusion, unchanged from previous imaging. Central pulmonary arteries unremarkable on venous phase assessment. Mediastinum/Nodes: Thoracic inlet structures are unremarkable. Soft tissue about the RIGHT hilum with similar appearance to prior imaging with further consolidative changes in the lung about the hilum. No discrete adenopathy in the mediastinum. No axillary lymphadenopathy. Mildly patulous and mildly thickened esophagus. Lungs/Pleura: Pulmonary emphysema. Volume loss related to post treatment changes with some increase about the RIGHT hilum. Distortion of bronchial and vascular structures in the central RIGHT chest. Mild ground-glass and potential  septal thickening has developed in the LEFT chest along the major fissure since the prior study best seen on image 37 of series 7. Subtle fissural nodularity may also be present. This represents a change from the previous study. LEFT chest is clear.  Airways are patent. Musculoskeletal: No chest wall lesion. See below for full musculoskeletal detail. CT ABDOMEN PELVIS FINDINGS Hepatobiliary: No focal, suspicious hepatic lesion. Portal vein is patent into the liver. No pericholecystic stranding or biliary duct distension. Pancreas: Pancreas is normal without ductal dilation or  inflammation. Spleen: Spleen normal in size and contour. Adrenals/Urinary Tract: Adrenal glands are normal. Symmetric renal enhancement.  No hydronephrosis. Stomach/Bowel: No acute gastrointestinal process. The appendix is normal. Positive contrast is passed through the gastrointestinal tract into the ascending colon. Vascular/Lymphatic: Calcified and noncalcified atheromatous plaque of the abdominal aorta without aneurysmal dilation. No retroperitoneal adenopathy. No pelvic lymphadenopathy. Reproductive: Post bilateral tubal ligation with clips as on the previous exam. No adnexal mass. Unremarkable appearance of the uterus and adnexa by CT. Other: No ascites. Musculoskeletal: Spinal degenerative changes. No acute bone finding. No destructive bone process. IMPRESSION: 1. Post treatment changes in the RIGHT chest without change, without definite signs of disease recurrence. 2. Ground-glass and subtle septal thickening with potential mild fissural nodularity in the LEFT chest along the superior aspect of the major fissure. This may be infectious or inflammatory, consider correlation with any respiratory symptoms. Developing lymphangitic carcinomatosis is also possible though follow-up will be necessary to determine significance. 3. No evidence of metastatic disease in the abdomen or pelvis. 4. Small pericardial effusion, unchanged from previous imaging. 5. Emphysema and aortic atherosclerosis. Aortic Atherosclerosis (ICD10-I70.0) and Emphysema (ICD10-J43.9). Electronically Signed   By: Zetta Bills M.D.   On: 12/26/2019 13:19   CT Abdomen Pelvis W Contrast  Result Date: 12/26/2019 CLINICAL DATA:  Non-small cell lung cancer staging, completed radiotherapy, currently on immunotherapy. EXAM: CT CHEST, ABDOMEN, AND PELVIS WITH CONTRAST TECHNIQUE: Multidetector CT imaging of the chest, abdomen and pelvis was performed following the standard protocol during bolus administration of intravenous contrast. CONTRAST:   173m OMNIPAQUE IOHEXOL 300 MG/ML  SOLN COMPARISON:  October 14, 2019 FINDINGS: CT CHEST FINDINGS Cardiovascular: Calcified and noncalcified atheromatous plaque in the thoracic aorta. Heart size is stable with small pericardial effusion, unchanged from previous imaging. Central pulmonary arteries unremarkable on venous phase assessment. Mediastinum/Nodes: Thoracic inlet structures are unremarkable. Soft tissue about the RIGHT hilum with similar appearance to prior imaging with further consolidative changes in the lung about the hilum. No discrete adenopathy in the mediastinum. No axillary lymphadenopathy. Mildly patulous and mildly thickened esophagus. Lungs/Pleura: Pulmonary emphysema. Volume loss related to post treatment changes with some increase about the RIGHT hilum. Distortion of bronchial and vascular structures in the central RIGHT chest. Mild ground-glass and potential septal thickening has developed in the LEFT chest along the major fissure since the prior study best seen on image 37 of series 7. Subtle fissural nodularity may also be present. This represents a change from the previous study. LEFT chest is clear.  Airways are patent. Musculoskeletal: No chest wall lesion. See below for full musculoskeletal detail. CT ABDOMEN PELVIS FINDINGS Hepatobiliary: No focal, suspicious hepatic lesion. Portal vein is patent into the liver. No pericholecystic stranding or biliary duct distension. Pancreas: Pancreas is normal without ductal dilation or inflammation. Spleen: Spleen normal in size and contour. Adrenals/Urinary Tract: Adrenal glands are normal. Symmetric renal enhancement.  No hydronephrosis. Stomach/Bowel: No acute gastrointestinal process. The appendix is normal. Positive contrast is  passed through the gastrointestinal tract into the ascending colon. Vascular/Lymphatic: Calcified and noncalcified atheromatous plaque of the abdominal aorta without aneurysmal dilation. No retroperitoneal adenopathy. No  pelvic lymphadenopathy. Reproductive: Post bilateral tubal ligation with clips as on the previous exam. No adnexal mass. Unremarkable appearance of the uterus and adnexa by CT. Other: No ascites. Musculoskeletal: Spinal degenerative changes. No acute bone finding. No destructive bone process. IMPRESSION: 1. Post treatment changes in the RIGHT chest without change, without definite signs of disease recurrence. 2. Ground-glass and subtle septal thickening with potential mild fissural nodularity in the LEFT chest along the superior aspect of the major fissure. This may be infectious or inflammatory, consider correlation with any respiratory symptoms. Developing lymphangitic carcinomatosis is also possible though follow-up will be necessary to determine significance. 3. No evidence of metastatic disease in the abdomen or pelvis. 4. Small pericardial effusion, unchanged from previous imaging. 5. Emphysema and aortic atherosclerosis. Aortic Atherosclerosis (ICD10-I70.0) and Emphysema (ICD10-J43.9). Electronically Signed   By: Zetta Bills M.D.   On: 12/26/2019 13:19    ASSESSMENT AND PLAN: This is a very pleasant 64 years old African-American female with metastatic non-small cell lung cancer initially diagnosed with a stage IIIA non-small cell lung cancer, adenocarcinoma.  She underwent a course of concurrent chemoradiation with weekly carboplatin and paclitaxel status post 7 cycles with partial response.   The patient tolerated this course of treatment well except for mild odynophagia and dysphagia. She completed on consolidation treatment with immunotherapy with Imfinzi (Durvalumab) status post 18 cycles. She also completed SBRT to the right supraclavicular lymphadenopathy. The patient had evidence for multiple brain metastasis in October 2020 and she underwent SRS treatment to this lesion under the care of Dr. Lisbeth Renshaw. The patient had evidence for disease progression and she started systemic chemotherapy with  carboplatin, Alimta and Keytruda status post 7 cycles.  Starting from cycle #5 she is on maintenance treatment with Alimta and Keytruda every 3 weeks. The patient continues to tolerate her treatment fairly well with no concerning adverse effect except for the dry mouth. I recommended for her to proceed with cycle #8 today as planned. For the dry mouth she will continue with Biotene and salivasure as needed. She will come back for follow-up visit in 3 weeks for evaluation before the next cycle of her treatment. The patient was advised to call immediately if she has any concerning symptoms in the interval. The patient voices understanding of current disease status and treatment options and is in agreement with the current care plan. All questions were answered. The patient knows to call the clinic with any problems, questions or concerns. We can certainly see the patient much sooner if necessary.  Disclaimer: This note was dictated with voice recognition software. Similar sounding words can inadvertently be transcribed and may not be corrected upon review.

## 2020-01-20 ENCOUNTER — Other Ambulatory Visit: Payer: Self-pay | Admitting: Internal Medicine

## 2020-01-20 ENCOUNTER — Other Ambulatory Visit: Payer: Self-pay | Admitting: Physician Assistant

## 2020-01-20 DIAGNOSIS — M0579 Rheumatoid arthritis with rheumatoid factor of multiple sites without organ or systems involvement: Secondary | ICD-10-CM

## 2020-01-24 ENCOUNTER — Telehealth: Payer: Self-pay | Admitting: Internal Medicine

## 2020-01-24 NOTE — Telephone Encounter (Signed)
R/s 10/12 appt - per provider PAL. Called and spoke with patient. Confirmed new appt

## 2020-02-03 ENCOUNTER — Other Ambulatory Visit: Payer: Self-pay | Admitting: *Deleted

## 2020-02-03 DIAGNOSIS — C7931 Secondary malignant neoplasm of brain: Secondary | ICD-10-CM

## 2020-02-06 ENCOUNTER — Other Ambulatory Visit: Payer: Self-pay

## 2020-02-06 ENCOUNTER — Encounter: Payer: Self-pay | Admitting: Internal Medicine

## 2020-02-06 ENCOUNTER — Inpatient Hospital Stay: Payer: Medicare PPO | Attending: Internal Medicine

## 2020-02-06 ENCOUNTER — Inpatient Hospital Stay: Payer: Medicare PPO

## 2020-02-06 ENCOUNTER — Inpatient Hospital Stay: Payer: Medicare PPO | Admitting: Internal Medicine

## 2020-02-06 VITALS — BP 126/63 | HR 100 | Temp 97.8°F | Resp 18 | Ht 60.0 in | Wt 132.9 lb

## 2020-02-06 DIAGNOSIS — C7931 Secondary malignant neoplasm of brain: Secondary | ICD-10-CM | POA: Diagnosis not present

## 2020-02-06 DIAGNOSIS — C3490 Malignant neoplasm of unspecified part of unspecified bronchus or lung: Secondary | ICD-10-CM

## 2020-02-06 DIAGNOSIS — Z5111 Encounter for antineoplastic chemotherapy: Secondary | ICD-10-CM

## 2020-02-06 DIAGNOSIS — R682 Dry mouth, unspecified: Secondary | ICD-10-CM | POA: Diagnosis not present

## 2020-02-06 DIAGNOSIS — C3431 Malignant neoplasm of lower lobe, right bronchus or lung: Secondary | ICD-10-CM

## 2020-02-06 DIAGNOSIS — Z923 Personal history of irradiation: Secondary | ICD-10-CM | POA: Insufficient documentation

## 2020-02-06 DIAGNOSIS — Z7982 Long term (current) use of aspirin: Secondary | ICD-10-CM | POA: Insufficient documentation

## 2020-02-06 DIAGNOSIS — M069 Rheumatoid arthritis, unspecified: Secondary | ICD-10-CM | POA: Diagnosis not present

## 2020-02-06 DIAGNOSIS — C3491 Malignant neoplasm of unspecified part of right bronchus or lung: Secondary | ICD-10-CM

## 2020-02-06 DIAGNOSIS — Z79899 Other long term (current) drug therapy: Secondary | ICD-10-CM | POA: Diagnosis not present

## 2020-02-06 DIAGNOSIS — C770 Secondary and unspecified malignant neoplasm of lymph nodes of head, face and neck: Secondary | ICD-10-CM | POA: Insufficient documentation

## 2020-02-06 DIAGNOSIS — Z5112 Encounter for antineoplastic immunotherapy: Secondary | ICD-10-CM | POA: Diagnosis not present

## 2020-02-06 DIAGNOSIS — R5383 Other fatigue: Secondary | ICD-10-CM | POA: Diagnosis not present

## 2020-02-06 LAB — CBC WITH DIFFERENTIAL (CANCER CENTER ONLY)
Abs Immature Granulocytes: 0.01 10*3/uL (ref 0.00–0.07)
Basophils Absolute: 0 10*3/uL (ref 0.0–0.1)
Basophils Relative: 1 %
Eosinophils Absolute: 0 10*3/uL (ref 0.0–0.5)
Eosinophils Relative: 1 %
HCT: 32.4 % — ABNORMAL LOW (ref 36.0–46.0)
Hemoglobin: 10.4 g/dL — ABNORMAL LOW (ref 12.0–15.0)
Immature Granulocytes: 0 %
Lymphocytes Relative: 18 %
Lymphs Abs: 1 10*3/uL (ref 0.7–4.0)
MCH: 33 pg (ref 26.0–34.0)
MCHC: 32.1 g/dL (ref 30.0–36.0)
MCV: 102.9 fL — ABNORMAL HIGH (ref 80.0–100.0)
Monocytes Absolute: 0.7 10*3/uL (ref 0.1–1.0)
Monocytes Relative: 13 %
Neutro Abs: 3.7 10*3/uL (ref 1.7–7.7)
Neutrophils Relative %: 67 %
Platelet Count: 344 10*3/uL (ref 150–400)
RBC: 3.15 MIL/uL — ABNORMAL LOW (ref 3.87–5.11)
RDW: 14.4 % (ref 11.5–15.5)
WBC Count: 5.4 10*3/uL (ref 4.0–10.5)
nRBC: 0 % (ref 0.0–0.2)

## 2020-02-06 LAB — CMP (CANCER CENTER ONLY)
ALT: 17 U/L (ref 0–44)
AST: 36 U/L (ref 15–41)
Albumin: 3.4 g/dL — ABNORMAL LOW (ref 3.5–5.0)
Alkaline Phosphatase: 67 U/L (ref 38–126)
Anion gap: 7 (ref 5–15)
BUN: 11 mg/dL (ref 8–23)
CO2: 23 mmol/L (ref 22–32)
Calcium: 10.4 mg/dL — ABNORMAL HIGH (ref 8.9–10.3)
Chloride: 108 mmol/L (ref 98–111)
Creatinine: 0.95 mg/dL (ref 0.44–1.00)
GFR, Estimated: >60 mL/min (ref >60)
Glucose, Bld: 90 mg/dL (ref 70–99)
Potassium: 3.8 mmol/L (ref 3.5–5.1)
Sodium: 138 mmol/L (ref 135–145)
Total Bilirubin: <0.2 mg/dL — ABNORMAL LOW (ref 0.3–1.2)
Total Protein: 7.7 g/dL (ref 6.5–8.1)

## 2020-02-06 LAB — TSH: TSH: 1.144 u[IU]/mL (ref 0.308–3.960)

## 2020-02-06 MED ORDER — PROCHLORPERAZINE MALEATE 10 MG PO TABS
ORAL_TABLET | ORAL | Status: AC
  Filled 2020-02-06: qty 1

## 2020-02-06 MED ORDER — SODIUM CHLORIDE 0.9 % IV SOLN
Freq: Once | INTRAVENOUS | Status: AC
  Filled 2020-02-06: qty 250

## 2020-02-06 MED ORDER — SODIUM CHLORIDE 0.9 % IV SOLN
500.0000 mg/m2 | Freq: Once | INTRAVENOUS | Status: AC
  Administered 2020-02-06: 800 mg via INTRAVENOUS
  Filled 2020-02-06: qty 20

## 2020-02-06 MED ORDER — PROCHLORPERAZINE MALEATE 10 MG PO TABS
10.0000 mg | ORAL_TABLET | Freq: Once | ORAL | Status: AC
  Administered 2020-02-06: 10 mg via ORAL

## 2020-02-06 MED ORDER — SODIUM CHLORIDE 0.9 % IV SOLN
200.0000 mg | Freq: Once | INTRAVENOUS | Status: AC
  Administered 2020-02-06: 200 mg via INTRAVENOUS
  Filled 2020-02-06: qty 8

## 2020-02-06 MED ORDER — CYANOCOBALAMIN 1000 MCG/ML IJ SOLN: 1000.0000 ug | Freq: Once | INTRAMUSCULAR | Status: DC

## 2020-02-06 NOTE — Patient Instructions (Signed)
[facility] Discharge Instructions for Patients Receiving Chemotherapy  Today you received the following chemotherapy agents: Keytruda, Alimta  To help prevent nausea and vomiting after your treatment, we encourage you to take your nausea medication as directed.   If you develop nausea and vomiting that is not controlled by your nausea medication, call the clinic.   BELOW ARE SYMPTOMS THAT SHOULD BE REPORTED IMMEDIATELY:  *FEVER GREATER THAN 100.5 F  *CHILLS WITH OR WITHOUT FEVER  NAUSEA AND VOMITING THAT IS NOT CONTROLLED WITH YOUR NAUSEA MEDICATION  *UNUSUAL SHORTNESS OF BREATH  *UNUSUAL BRUISING OR BLEEDING  TENDERNESS IN MOUTH AND THROAT WITH OR WITHOUT PRESENCE OF ULCERS  *URINARY PROBLEMS  *BOWEL PROBLEMS  UNUSUAL RASH Items with * indicate a potential emergency and should be followed up as soon as possible.  Feel free to call the clinic should you have any questions or concerns. The clinic phone number is [phone].  Please show the [facility] at check-in to the Emergency Department and triage nurse.

## 2020-02-06 NOTE — Progress Notes (Signed)
[facility] Telephone:(336) [phone]   Fax:(336) [phone]  OFFICE PROGRESS NOTE  Patient, No Pcp Per No address on file  DIAGNOSIS: Metastatic non-small cell lung cancer initially diagnosed as stage IIIA (T3, N1/N2, M0) non-small cell lung cancer, adenocarcinoma presented with large right lower lobe lung mass with extension to the right hilum and subcarinal area diagnosed in July 2019.  She has brain metastasis in October 2020.  Biomarker Findings Tumor Mutational Burden - TMB-Intermediate (6 Muts/Mb) Microsatellite status - MS-Stable Genomic Findings For a complete list of the genes assayed, please refer to the Appendix. NRAS Q61R ARAF amplification STK11 G56W KRAS G13D MYCN amplification MCL1 amplification NKX2-1 amplification - equivocal? TP53 G245V 7 Disease relevant genes with no reportable alterations: EGFR, ALK, BRAF, MET, ERBB2, RET, ROS1   PRIOR THERAPY:  1) Course of concurrent chemoradiation with weekly carboplatin for AUC of 2 and paclitaxel 45 mg/M2.  Status post 7 cycles.  Last dose was giving 01/11/2018. 2) Consolidation treatment with immunotherapy with Imfinzi (Durvalumab) 10 mg/KG every 2 weeks.  First dose February 09, 2018.  Status post 19 cycles. 3) status post stereotactic body radiotherapy to the enlarging right supraclavicular lymphadenopathy under the care of Dr. Lisbeth Renshaw. 4) SRS to multiple brain metastasis under the care of Dr. Lisbeth Renshaw.  CURRENT THERAPY: Systemic chemotherapy with carboplatin for AUC of 5, Alimta 500 mg/M2 and Keytruda 200 mg IV every 3 weeks.  First dose 08/16/2019.  Status post 8 cycles.  Starting from cycle #5 the patient is on maintenance treatment with Alimta and Keytruda every 3 weeks.  INTERVAL HISTORY: Susan Holder 64 y.o. female returns to the clinic today for follow-up visit.  The patient is feeling fine today with no concerning complaints except for fatigue.  She denied having any chest pain, shortness of  breath, cough or hemoptysis.  She denied having any fever or chills.  She has no nausea, vomiting, diarrhea or constipation.  She has no headache or visual changes.  She is here today for evaluation before starting cycle #9 of her treatment.    MEDICAL HISTORY: Past Medical History:  Diagnosis Date  . met lung ca dx'd 09/2017   neck LN and brain 2020  . Rheumatoid arthritis ([place])     ALLERGIES:  has No Known Allergies.  MEDICATIONS:  Current Outpatient Medications  Medication Sig Dispense Refill  . aspirin EC 81 MG tablet Take 81 mg by mouth daily.    . diclofenac sodium (VOLTAREN) 1 % GEL Apply 2 g topically 4 (four) times daily.    . folic acid (FOLVITE) 1 MG tablet TAKE 1 TABLET(1 MG) BY MOUTH DAILY 30 tablet 4  . hydroxychloroquine (PLAQUENIL) 200 MG tablet TAKE 1 TABLET BY MOUTH TWICE DAILY MONDAY-FRIDAY 120 tablet 0  . naproxen sodium (ALEVE) 220 MG tablet Take 220 mg by mouth daily as needed (PAIN).    Marland Kitchen omeprazole (PRILOSEC) 20 MG capsule Take 20 mg by mouth daily.    Marland Kitchen Phenylephrine-DM-GG-APAP (DELSYM COUGH/COLD DAYTIME PO) Take by mouth as needed.     . prochlorperazine (COMPAZINE) 10 MG tablet TAKE 1 TABLET(10 MG) BY MOUTH EVERY 6 HOURS AS NEEDED FOR NAUSEA OR VOMITING 30 tablet 0  . pseudoephedrine-guaifenesin (MUCINEX D) 60-600 MG 12 hr tablet Take 1 tablet by mouth as needed.     Marland Kitchen VITAMIN D PO Take by mouth.     No current facility-administered medications for this visit.    SURGICAL HISTORY:  Past Surgical History:  Procedure Laterality Date  . BACK SURGERY    . BRONCHIAL NEEDLE ASPIRATION BIOPSY  11/09/2017   Procedure: BRONCHIAL NEEDLE ASPIRATION BIOPSIES;  Surgeon: Marshell Garfinkel, MD;  Location: [facility];  Service: Cardiopulmonary;;  . ENDOBRONCHIAL ULTRASOUND Bilateral 11/09/2017   Procedure: ENDOBRONCHIAL ULTRASOUND;  Surgeon: Marshell Garfinkel, MD;  Location: [facility];  Service: Cardiopulmonary;  Laterality: Bilateral;  . NASAL SINUS SURGERY    .  NASAL SINUS SURGERY    . TUBAL LIGATION    . VIDEO BRONCHOSCOPY  11/09/2017   Procedure: VIDEO BRONCHOSCOPY;  Surgeon: Marshell Garfinkel, MD;  Location: [facility];  Service: Cardiopulmonary;;    REVIEW OF SYSTEMS:  A comprehensive review of systems was negative except for: Constitutional: positive for fatigue   PHYSICAL EXAMINATION: General appearance: alert, cooperative, fatigued and no distress Head: Normocephalic, without obvious abnormality, atraumatic Neck: no adenopathy, no JVD, supple, symmetrical, trachea midline and thyroid not enlarged, symmetric, no tenderness/mass/nodules Lymph nodes: Cervical, supraclavicular, and axillary nodes normal. Resp: clear to auscultation bilaterally Back: symmetric, no curvature. ROM normal. No CVA tenderness. Cardio: regular rate and rhythm, S1, S2 normal, no murmur, click, rub or gallop GI: soft, non-tender; bowel sounds normal; no masses,  no organomegaly Extremities: extremities normal, atraumatic, no cyanosis or edema  ECOG PERFORMANCE STATUS: 1 - Symptomatic but completely ambulatory  Blood pressure 126/63, pulse 100, temperature 97.8 F (36.6 C), temperature source Tympanic, resp. rate 18, height 5' (1.524 m), weight 132 lb 14.4 oz (60.3 kg), last menstrual period 04/28/2000, SpO2 100 %.  LABORATORY DATA: Lab Results  Component Value Date   WBC 5.9 01/17/2020   HGB 10.4 (L) 01/17/2020   HCT 32.3 (L) 01/17/2020   MCV 101.9 (H) 01/17/2020   PLT 332 01/17/2020      Chemistry      Component Value Date/Time   NA 137 01/17/2020 0805   K 3.6 01/17/2020 0805   CL 104 01/17/2020 0805   CO2 19 (L) 01/17/2020 0805   BUN 6 (L) 01/17/2020 0805   CREATININE 0.92 01/17/2020 0805   CREATININE 0.80 07/24/2017 1438      Component Value Date/Time   CALCIUM 10.3 01/17/2020 0805   ALKPHOS 73 01/17/2020 0805   AST 36 01/17/2020 0805   ALT 14 01/17/2020 0805   BILITOT 0.3 01/17/2020 0805       RADIOGRAPHIC STUDIES: No results  found.  ASSESSMENT AND PLAN: This is a very pleasant 64 years old African-American female with metastatic non-small cell lung cancer initially diagnosed with a stage IIIA non-small cell lung cancer, adenocarcinoma.  She underwent a course of concurrent chemoradiation with weekly carboplatin and paclitaxel status post 7 cycles with partial response.   The patient tolerated this course of treatment well except for mild odynophagia and dysphagia. She completed on consolidation treatment with immunotherapy with Imfinzi (Durvalumab) status post 18 cycles. She also completed SBRT to the right supraclavicular lymphadenopathy. The patient had evidence for multiple brain metastasis in October 2020 and she underwent SRS treatment to this lesion under the care of Dr. Lisbeth Renshaw. The patient had evidence for disease progression and she started systemic chemotherapy with carboplatin, Alimta and Keytruda status post 8 cycles.  Starting from cycle #5 she is on maintenance treatment with Alimta and Keytruda every 3 weeks. The patient continues to tolerate her treatment well except for fatigue. I recommended for her to proceed with cycle #9 today as planned. For the dry mouth she will continue with Biotene and salivasure as needed. I will see her back for  follow-up visit in 3 weeks for evaluation after repeating CT scan of the chest, abdomen pelvis for restaging of her disease. The patient was advised to call immediately if she has any concerning symptoms in the interval. The patient voices understanding of current disease status and treatment options and is in agreement with the current care plan. All questions were answered. The patient knows to call the clinic with any problems, questions or concerns. We can certainly see the patient much sooner if necessary.  Disclaimer: This note was dictated with voice recognition software. Similar sounding words can inadvertently be transcribed and may not be corrected upon  review.

## 2020-02-07 ENCOUNTER — Ambulatory Visit: Payer: Medicare PPO

## 2020-02-07 ENCOUNTER — Other Ambulatory Visit: Payer: Medicare PPO

## 2020-02-07 ENCOUNTER — Ambulatory Visit: Payer: Medicare PPO | Admitting: Internal Medicine

## 2020-02-23 ENCOUNTER — Telehealth: Payer: Self-pay | Admitting: Medical Oncology

## 2020-02-23 NOTE — Telephone Encounter (Signed)
Clinical information provided for CT scan.

## 2020-02-24 ENCOUNTER — Ambulatory Visit (HOSPITAL_COMMUNITY): Payer: Medicare PPO

## 2020-02-28 ENCOUNTER — Other Ambulatory Visit: Payer: Self-pay

## 2020-02-28 ENCOUNTER — Inpatient Hospital Stay: Payer: Medicare PPO

## 2020-02-28 ENCOUNTER — Encounter: Payer: Self-pay | Admitting: Internal Medicine

## 2020-02-28 ENCOUNTER — Inpatient Hospital Stay: Payer: Medicare PPO | Attending: Internal Medicine

## 2020-02-28 ENCOUNTER — Inpatient Hospital Stay: Payer: Medicare PPO | Admitting: Internal Medicine

## 2020-02-28 VITALS — HR 108

## 2020-02-28 VITALS — BP 131/75 | HR 115 | Temp 97.5°F | Resp 18 | Ht 60.0 in | Wt 131.8 lb

## 2020-02-28 DIAGNOSIS — Z923 Personal history of irradiation: Secondary | ICD-10-CM | POA: Insufficient documentation

## 2020-02-28 DIAGNOSIS — Z5111 Encounter for antineoplastic chemotherapy: Secondary | ICD-10-CM | POA: Diagnosis not present

## 2020-02-28 DIAGNOSIS — C3431 Malignant neoplasm of lower lobe, right bronchus or lung: Secondary | ICD-10-CM | POA: Diagnosis not present

## 2020-02-28 DIAGNOSIS — C7931 Secondary malignant neoplasm of brain: Secondary | ICD-10-CM | POA: Insufficient documentation

## 2020-02-28 DIAGNOSIS — Z79899 Other long term (current) drug therapy: Secondary | ICD-10-CM | POA: Insufficient documentation

## 2020-02-28 DIAGNOSIS — M069 Rheumatoid arthritis, unspecified: Secondary | ICD-10-CM | POA: Insufficient documentation

## 2020-02-28 DIAGNOSIS — C3491 Malignant neoplasm of unspecified part of right bronchus or lung: Secondary | ICD-10-CM

## 2020-02-28 DIAGNOSIS — C770 Secondary and unspecified malignant neoplasm of lymph nodes of head, face and neck: Secondary | ICD-10-CM | POA: Diagnosis not present

## 2020-02-28 DIAGNOSIS — Z5112 Encounter for antineoplastic immunotherapy: Secondary | ICD-10-CM | POA: Diagnosis not present

## 2020-02-28 DIAGNOSIS — Z7982 Long term (current) use of aspirin: Secondary | ICD-10-CM | POA: Diagnosis not present

## 2020-02-28 LAB — CMP (CANCER CENTER ONLY)
ALT: 17 U/L (ref 0–44)
AST: 31 U/L (ref 15–41)
Albumin: 3.6 g/dL (ref 3.5–5.0)
Alkaline Phosphatase: 69 U/L (ref 38–126)
Anion gap: 9 (ref 5–15)
BUN: 11 mg/dL (ref 8–23)
CO2: 22 mmol/L (ref 22–32)
Calcium: 10.3 mg/dL (ref 8.9–10.3)
Chloride: 107 mmol/L (ref 98–111)
Creatinine: 0.96 mg/dL (ref 0.44–1.00)
GFR, Estimated: >60 mL/min (ref >60)
Glucose, Bld: 106 mg/dL — ABNORMAL HIGH (ref 70–99)
Potassium: 3.7 mmol/L (ref 3.5–5.1)
Sodium: 138 mmol/L (ref 135–145)
Total Bilirubin: 0.3 mg/dL (ref 0.3–1.2)
Total Protein: 7.8 g/dL (ref 6.5–8.1)

## 2020-02-28 LAB — CBC WITH DIFFERENTIAL (CANCER CENTER ONLY)
Abs Immature Granulocytes: 0.02 10*3/uL (ref 0.00–0.07)
Basophils Absolute: 0 10*3/uL (ref 0.0–0.1)
Basophils Relative: 1 %
Eosinophils Absolute: 0 10*3/uL (ref 0.0–0.5)
Eosinophils Relative: 0 %
HCT: 32.7 % — ABNORMAL LOW (ref 36.0–46.0)
Hemoglobin: 10.6 g/dL — ABNORMAL LOW (ref 12.0–15.0)
Immature Granulocytes: 0 %
Lymphocytes Relative: 17 %
Lymphs Abs: 1.1 10*3/uL (ref 0.7–4.0)
MCH: 33 pg (ref 26.0–34.0)
MCHC: 32.4 g/dL (ref 30.0–36.0)
MCV: 101.9 fL — ABNORMAL HIGH (ref 80.0–100.0)
Monocytes Absolute: 0.7 10*3/uL (ref 0.1–1.0)
Monocytes Relative: 11 %
Neutro Abs: 4.5 10*3/uL (ref 1.7–7.7)
Neutrophils Relative %: 71 %
Platelet Count: 331 10*3/uL (ref 150–400)
RBC: 3.21 MIL/uL — ABNORMAL LOW (ref 3.87–5.11)
RDW: 14.3 % (ref 11.5–15.5)
WBC Count: 6.3 10*3/uL (ref 4.0–10.5)
nRBC: 0 % (ref 0.0–0.2)

## 2020-02-28 LAB — TSH: TSH: 1.934 u[IU]/mL (ref 0.308–3.960)

## 2020-02-28 MED ORDER — PROCHLORPERAZINE MALEATE 10 MG PO TABS
ORAL_TABLET | ORAL | Status: AC
  Filled 2020-02-28: qty 1

## 2020-02-28 MED ORDER — SODIUM CHLORIDE 0.9 % IV SOLN
Freq: Once | INTRAVENOUS | Status: AC
  Filled 2020-02-28: qty 250

## 2020-02-28 MED ORDER — SODIUM CHLORIDE 0.9 % IV SOLN
500.0000 mg/m2 | Freq: Once | INTRAVENOUS | Status: AC
  Administered 2020-02-28: 800 mg via INTRAVENOUS
  Filled 2020-02-28: qty 20

## 2020-02-28 MED ORDER — PROCHLORPERAZINE MALEATE 10 MG PO TABS
10.0000 mg | ORAL_TABLET | Freq: Once | ORAL | Status: AC
  Administered 2020-02-28: 10 mg via ORAL

## 2020-02-28 MED ORDER — SODIUM CHLORIDE 0.9 % IV SOLN
200.0000 mg | Freq: Once | INTRAVENOUS | Status: AC
  Administered 2020-02-28: 200 mg via INTRAVENOUS
  Filled 2020-02-28: qty 8

## 2020-02-28 NOTE — Progress Notes (Signed)
[place] [phone]   [phone]  OFFICE PROGRESS NOTE  Patient, No Pcp Per No address on file  DIAGNOSIS: Metastatic non-small cell lung cancer initially diagnosed as stage IIIA (T3, N1/N2, M0) non-small cell lung cancer, adenocarcinoma presented with large right lower lobe lung mass with extension to the right hilum and subcarinal area diagnosed in July 2019.  She has brain metastasis in October 2020.  Biomarker Findings Tumor Mutational Burden - TMB-Intermediate (6 Muts/Mb) Microsatellite status - MS-Stable Genomic Findings For a complete list of the genes assayed, please refer to the Appendix. NRAS Q61R ARAF amplification STK11 G56W KRAS G13D MYCN amplification MCL1 amplification NKX2-1 amplification - equivocal? TP53 G245V 7 Disease relevant genes with no reportable alterations: EGFR, ALK, BRAF, MET, ERBB2, RET, ROS1   PRIOR THERAPY:  1) Course of concurrent chemoradiation with weekly carboplatin for AUC of 2 and paclitaxel 45 mg/M2.  Status post 7 cycles.  Last dose was giving 01/11/2018. 2) Consolidation treatment with immunotherapy with Imfinzi (Durvalumab) 10 mg/KG every 2 weeks.  First dose February 09, 2018.  Status post 19 cycles. 3) status post stereotactic body radiotherapy to the enlarging right supraclavicular lymphadenopathy under the care of Dr. Lisbeth Renshaw. 4) SRS to multiple brain metastasis under the care of Dr. Lisbeth Renshaw.  CURRENT THERAPY: Systemic chemotherapy with carboplatin for AUC of 5, Alimta 500 mg/M2 and Keytruda 200 mg IV every 3 weeks.  First dose 08/16/2019.  Status post 9 cycles.  Starting from cycle #5 the patient is on maintenance treatment with Alimta and Keytruda every 3 weeks.  INTERVAL HISTORY: Susan Holder 64 y.o. female returns to the clinic today for follow-up visit.  The patient is feeling fine today with no concerning complaints except for mild fatigue.  She continues to tolerate her maintenance  treatment with Alimta and Keytruda fairly well.  She noticed some change in the pigmentation of her scalp with the current treatment.  She denied having any chest pain, shortness of breath, cough or hemoptysis.  She denied having any nausea, vomiting, diarrhea or constipation.  She has no headache or visual changes.  She is here today for evaluation before starting cycle #10 of her treatment.  MEDICAL HISTORY: Past Medical History:  Diagnosis Date  . met lung ca dx'd 09/2017   neck LN and brain 2020  . Rheumatoid arthritis ([place])     ALLERGIES:  has No Known Allergies.  MEDICATIONS:  Current Outpatient Medications  Medication Sig Dispense Refill  . aspirin EC 81 MG tablet Take 81 mg by mouth daily.    . diclofenac sodium (VOLTAREN) 1 % GEL Apply 2 g topically 4 (four) times daily.    . folic acid (FOLVITE) 1 MG tablet TAKE 1 TABLET(1 MG) BY MOUTH DAILY 30 tablet 4  . hydroxychloroquine (PLAQUENIL) 200 MG tablet TAKE 1 TABLET BY MOUTH TWICE DAILY MONDAY-FRIDAY 120 tablet 0  . naproxen sodium (ALEVE) 220 MG tablet Take 220 mg by mouth daily as needed (PAIN).    Marland Kitchen omeprazole (PRILOSEC) 20 MG capsule Take 20 mg by mouth daily.    Marland Kitchen Phenylephrine-DM-GG-APAP (DELSYM COUGH/COLD DAYTIME PO) Take by mouth as needed.     . prochlorperazine (COMPAZINE) 10 MG tablet TAKE 1 TABLET(10 MG) BY MOUTH EVERY 6 HOURS AS NEEDED FOR NAUSEA OR VOMITING 30 tablet 0  . pseudoephedrine-guaifenesin (MUCINEX D) 60-600 MG 12 hr tablet Take 1 tablet by mouth as needed.     Marland Kitchen VITAMIN D PO Take by mouth.  No current facility-administered medications for this visit.    SURGICAL HISTORY:  Past Surgical History:  Procedure Laterality Date  . BACK SURGERY    . BRONCHIAL NEEDLE ASPIRATION BIOPSY  11/09/2017   Procedure: BRONCHIAL NEEDLE ASPIRATION BIOPSIES;  Surgeon: Marshell Garfinkel, MD;  Location: [facility];  Service: Cardiopulmonary;;  . ENDOBRONCHIAL ULTRASOUND Bilateral 11/09/2017   Procedure: ENDOBRONCHIAL  ULTRASOUND;  Surgeon: Marshell Garfinkel, MD;  Location: [facility];  Service: Cardiopulmonary;  Laterality: Bilateral;  . NASAL SINUS SURGERY    . NASAL SINUS SURGERY    . TUBAL LIGATION    . VIDEO BRONCHOSCOPY  11/09/2017   Procedure: VIDEO BRONCHOSCOPY;  Surgeon: Marshell Garfinkel, MD;  Location: [facility];  Service: Cardiopulmonary;;    REVIEW OF SYSTEMS:  A comprehensive review of systems was negative except for: Constitutional: positive for fatigue   PHYSICAL EXAMINATION: General appearance: alert, cooperative, fatigued and no distress Head: Normocephalic, without obvious abnormality, atraumatic Neck: no adenopathy, no JVD, supple, symmetrical, trachea midline and thyroid not enlarged, symmetric, no tenderness/mass/nodules Lymph nodes: Cervical, supraclavicular, and axillary nodes normal. Resp: clear to auscultation bilaterally Back: symmetric, no curvature. ROM normal. No CVA tenderness. Cardio: regular rate and rhythm, S1, S2 normal, no murmur, click, rub or gallop GI: soft, non-tender; bowel sounds normal; no masses,  no organomegaly Extremities: extremities normal, atraumatic, no cyanosis or edema  ECOG PERFORMANCE STATUS: 1 - Symptomatic but completely ambulatory  Blood pressure 131/75, pulse (!) 115, temperature (!) 97.5 F (36.4 C), temperature source Tympanic, resp. rate 18, height 5' (1.524 m), weight 131 lb 12.8 oz (59.8 kg), last menstrual period 04/28/2000, SpO2 100 %.  LABORATORY DATA: Lab Results  Component Value Date   WBC 6.3 02/28/2020   HGB 10.6 (L) 02/28/2020   HCT 32.7 (L) 02/28/2020   MCV 101.9 (H) 02/28/2020   PLT 331 02/28/2020      Chemistry      Component Value Date/Time   NA 138 02/06/2020 0940   K 3.8 02/06/2020 0940   CL 108 02/06/2020 0940   CO2 23 02/06/2020 0940   BUN 11 02/06/2020 0940   CREATININE 0.95 02/06/2020 0940   CREATININE 0.80 07/24/2017 1438      Component Value Date/Time   CALCIUM 10.4 (H) 02/06/2020 0940   ALKPHOS 67  02/06/2020 0940   AST 36 02/06/2020 0940   ALT 17 02/06/2020 0940   BILITOT <0.2 (L) 02/06/2020 0940       RADIOGRAPHIC STUDIES: No results found.  ASSESSMENT AND PLAN: This is a very pleasant 64 years old African-American female with metastatic non-small cell lung cancer initially diagnosed with a stage IIIA non-small cell lung cancer, adenocarcinoma.  She underwent a course of concurrent chemoradiation with weekly carboplatin and paclitaxel status post 7 cycles with partial response.   The patient tolerated this course of treatment well except for mild odynophagia and dysphagia. She completed on consolidation treatment with immunotherapy with Imfinzi (Durvalumab) status post 18 cycles. She also completed SBRT to the right supraclavicular lymphadenopathy. The patient had evidence for multiple brain metastasis in October 2020 and she underwent SRS treatment to this lesion under the care of Dr. Lisbeth Renshaw. The patient had evidence for disease progression and she started systemic chemotherapy with carboplatin, Alimta and Keytruda status post 9 cycles.  Starting from cycle #5 she is on maintenance treatment with Alimta and Keytruda every 3 weeks. She continues to tolerate her maintenance treatment fairly well with no concerning adverse effects. She was supposed to have repeat CT scan of  the chest, abdomen pelvis before this visit but unfortunately it is scheduled to be done later this week. I recommended for the patient to proceed with cycle #10 today as planned. She will come back for follow-up visit in 3 weeks for evaluation before the next cycle of her treatment. For the dry mouth she will continue with Biotene and salivasure as needed. The patient was advised to call immediately if she has any concerning symptoms in the interval. The patient voices understanding of current disease status and treatment options and is in agreement with the current care plan. All questions were answered. The patient  knows to call the clinic with any problems, questions or concerns. We can certainly see the patient much sooner if necessary.  Disclaimer: This note was dictated with voice recognition software. Similar sounding words can inadvertently be transcribed and may not be corrected upon review.

## 2020-02-28 NOTE — Patient Instructions (Signed)
[facility] Discharge Instructions for Patients Receiving Chemotherapy  Today you received the following chemotherapy agents: Keytruda, Alimta  To help prevent nausea and vomiting after your treatment, we encourage you to take your nausea medication as directed.    If you develop nausea and vomiting that is not controlled by your nausea medication, call the clinic.   BELOW ARE SYMPTOMS THAT SHOULD BE REPORTED IMMEDIATELY:  *FEVER GREATER THAN 100.5 F  *CHILLS WITH OR WITHOUT FEVER  NAUSEA AND VOMITING THAT IS NOT CONTROLLED WITH YOUR NAUSEA MEDICATION  *UNUSUAL SHORTNESS OF BREATH  *UNUSUAL BRUISING OR BLEEDING  TENDERNESS IN MOUTH AND THROAT WITH OR WITHOUT PRESENCE OF ULCERS  *URINARY PROBLEMS  *BOWEL PROBLEMS  UNUSUAL RASH Items with * indicate a potential emergency and should be followed up as soon as possible.  Feel free to call the clinic should you have any questions or concerns. The clinic phone number is [phone].  Please show the [facility] at check-in to the Emergency Department and triage nurse.

## 2020-02-28 NOTE — Progress Notes (Signed)
Per Dr. Earlie Server, [place] to treat with elevated pulse.

## 2020-03-01 ENCOUNTER — Other Ambulatory Visit: Payer: Self-pay

## 2020-03-01 ENCOUNTER — Ambulatory Visit
Admission: RE | Admit: 2020-03-01 | Discharge: 2020-03-01 | Disposition: A | Discharge status: Home / Self Care | Payer: Medicare PPO | Source: Ambulatory Visit | Attending: Radiation Oncology | Admitting: Radiation Oncology

## 2020-03-01 DIAGNOSIS — M2548 Effusion, other site: Secondary | ICD-10-CM | POA: Diagnosis not present

## 2020-03-01 DIAGNOSIS — H748X2 Other specified disorders of left middle ear and mastoid: Secondary | ICD-10-CM | POA: Diagnosis not present

## 2020-03-01 DIAGNOSIS — C7931 Secondary malignant neoplasm of brain: Secondary | ICD-10-CM

## 2020-03-01 DIAGNOSIS — C719 Malignant neoplasm of brain, unspecified: Secondary | ICD-10-CM | POA: Diagnosis not present

## 2020-03-01 DIAGNOSIS — G9389 Other specified disorders of brain: Secondary | ICD-10-CM | POA: Diagnosis not present

## 2020-03-01 MED ORDER — GADOBENATE DIMEGLUMINE 529 MG/ML IV SOLN
12.0000 mL | Freq: Once | INTRAVENOUS | Status: AC | PRN
  Administered 2020-03-01: 12 mL via INTRAVENOUS

## 2020-03-02 ENCOUNTER — Ambulatory Visit (HOSPITAL_COMMUNITY)
Admission: RE | Admit: 2020-03-02 | Discharge: 2020-03-02 | Disposition: A | Discharge status: Home / Self Care | Payer: Medicare PPO | Source: Ambulatory Visit | Attending: Internal Medicine | Admitting: Internal Medicine

## 2020-03-02 DIAGNOSIS — C3490 Malignant neoplasm of unspecified part of unspecified bronchus or lung: Secondary | ICD-10-CM | POA: Insufficient documentation

## 2020-03-02 DIAGNOSIS — C7931 Secondary malignant neoplasm of brain: Secondary | ICD-10-CM | POA: Diagnosis not present

## 2020-03-02 DIAGNOSIS — J439 Emphysema, unspecified: Secondary | ICD-10-CM | POA: Diagnosis not present

## 2020-03-02 DIAGNOSIS — Z5111 Encounter for antineoplastic chemotherapy: Secondary | ICD-10-CM | POA: Diagnosis not present

## 2020-03-02 DIAGNOSIS — I313 Pericardial effusion (noninflammatory): Secondary | ICD-10-CM | POA: Diagnosis not present

## 2020-03-02 DIAGNOSIS — I700 Atherosclerosis of aorta: Secondary | ICD-10-CM | POA: Diagnosis not present

## 2020-03-02 MED ORDER — IOHEXOL 300 MG/ML  SOLN
100.0000 mL | Freq: Once | INTRAMUSCULAR | Status: AC | PRN
  Administered 2020-03-02: 100 mL via INTRAVENOUS

## 2020-03-05 ENCOUNTER — Other Ambulatory Visit: Payer: Self-pay

## 2020-03-05 ENCOUNTER — Ambulatory Visit
Admission: RE | Admit: 2020-03-05 | Discharge: 2020-03-05 | Disposition: A | Discharge status: Home / Self Care | Payer: Medicare PPO | Source: Ambulatory Visit | Attending: Radiation Oncology | Admitting: Radiation Oncology

## 2020-03-05 ENCOUNTER — Inpatient Hospital Stay: Payer: Medicare PPO

## 2020-03-05 DIAGNOSIS — C7931 Secondary malignant neoplasm of brain: Secondary | ICD-10-CM

## 2020-03-05 DIAGNOSIS — Z85118 Personal history of other malignant neoplasm of bronchus and lung: Secondary | ICD-10-CM | POA: Diagnosis not present

## 2020-03-05 DIAGNOSIS — Z8579 Personal history of other malignant neoplasms of lymphoid, hematopoietic and related tissues: Secondary | ICD-10-CM | POA: Diagnosis not present

## 2020-03-05 DIAGNOSIS — L819 Disorder of pigmentation, unspecified: Secondary | ICD-10-CM | POA: Diagnosis not present

## 2020-03-05 DIAGNOSIS — C770 Secondary and unspecified malignant neoplasm of lymph nodes of head, face and neck: Secondary | ICD-10-CM

## 2020-03-05 DIAGNOSIS — Z08 Encounter for follow-up examination after completed treatment for malignant neoplasm: Secondary | ICD-10-CM | POA: Diagnosis not present

## 2020-03-05 DIAGNOSIS — L659 Nonscarring hair loss, unspecified: Secondary | ICD-10-CM

## 2020-03-05 DIAGNOSIS — C3431 Malignant neoplasm of lower lobe, right bronchus or lung: Secondary | ICD-10-CM

## 2020-03-05 NOTE — Progress Notes (Signed)
Radiation Oncology         [phone] ________________________________  Outpatient Follow Up - Conducted via telephone due to current COVID-19 concerns for limiting patient exposure  I spoke with the patient to conduct this consult visit via telephone to spare the patient unnecessary potential exposure in the healthcare setting during the current COVID-19 pandemic. The patient was notified in advance and was offered a [place] meeting to allow for face to face communication but unfortunately reported that they did not have the appropriate resources/technology to support such a visit and instead preferred to proceed with a telephone visit.   ________________________________  Name: Susan Holder MRN: [id number]  Date of Service: 03/05/2020  DOB: 02/15/1956  Post Treatment Telephone Note  DIAGNOSIS:   Progressive MetastaticStage IIIA, cT3N1-2M0 NSCLC, adenocarcinoma of the right lower lobe with brain metastases  INTERVAL SINCE LAST RADIOTHERAPY: 1 year   03/09/2019 SRS Treatment: Each site was treated to 20 Gy in a single fraction PTV1 Post Cerebellum 45m PTV2 Lt Cerebellum 183mPTV3 Lo Rt Vent 76m3mTV4 Rt Frontal 5mm20mV5 Lt Frontal Operc 5mm 26m6 Lt Sylv 5mm  476m1/2020-12/20/2018: The patient's right supraclavicular nodes were treated to 60 Gy over 25 fractions.  11/30/17-01/11/18:  66 Gy to the right chest and regional nodes over 33 fractions  NARRATIVE: Susan Holder-known 64 y.o15 patient to our service with a history of adenocarcinoma of the right lower lobe.  When she was originally diagnosed in the summer 2019 she presented with local regional disease and received chemoradiation.  Unfortunately she recurred in the summer 2020, she did receive palliative radiotherapy to the right supraclavicular node, and in November 2020 was found to have 6 lesions in the brain felt to be metastatic for which she underwent SRS treatment in November 2020. She has been receiving   systemic therapy with carboplatin/alimta and Keytruda, though carboplatin has been held after cycle 4. She has since been without disease and her last MRI in August showed expected post treatment effect from prior radiotherapy about the left frontal and left cerebellar sites. Her most recent MRI on 03/01/20 which revealed stable, treated lesions in the brain, and no new disease. She does have a left mastoid effusion as well.   On review of systems, the patient reports that she is doing well overall.  She denies any headaches, dizziness, visual or auditory changes, or balance/gait disturbances. She does have occasional changes of cough that is somewhat productive with thick phlegm but no hemoptysis or significant color to this. She denies any chest pain or fevers. She repots she has more feelings of congestion in her throat than deeper in her chest. She denies progressive changes of this however. She describes difficulty with alopecia over the years but progressive changes and her hairdresser recently noted an area of hypopigmentation on her scalp. She's also had dry mouth as of late. No other complaints are verbalized.  PAST MEDICAL HISTORY:  Past Medical History:  Diagnosis Date  . met lung ca dx'd 09/2017   neck LN and brain 2020  . [place]  [place] Past Surgical History:  Procedure Laterality Date  . BACK SURGERY    . BRONCHIAL NEEDLE ASPIRATION BIOPSY  11/09/2017   Procedure: BRONCHIAL NEEDLE ASPIRATION BIOPSIES;  Surgeon: MannamMarshell Garfinkel [facility];  Service: Cardiopulmonary;;  . ENDOBRONCHIAL ULTRASOUND Bilateral 11/09/2017   Procedure: ENDOBRONCHIAL ULTRASOUND;  Surgeon: MannamMarshell Garfinkel [facility]: [facility];  Service:  Cardiopulmonary;  Laterality: Bilateral;  . NASAL SINUS SURGERY    . NASAL SINUS SURGERY    . TUBAL LIGATION    . VIDEO BRONCHOSCOPY  11/09/2017   Procedure: VIDEO BRONCHOSCOPY;  Surgeon: Marshell Garfinkel, MD;   Location: [facility];  Service: Cardiopulmonary;;    PAST SOCIAL HISTORY:  Social History   Socioeconomic History  . Marital status: Divorced    Spouse name: Not on file  . Number of children: Not on file  . Years of education: Not on file  . Highest education level: Not on file  Occupational History  . Not on file  Tobacco Use  . Smoking status: Former Smoker    Packs/day: 0.50    Years: 45.00    Pack years: 22.50    Types: Cigarettes    Quit date: 09/29/2017    Years since quitting: 2.4  . Smokeless tobacco: Never Used  Vaping Use  . Vaping Use: Never used  Substance and Sexual Activity  . Alcohol use: Not Currently  . Drug use: No  . Sexual activity: Not Currently  Other Topics Concern  . Not on file  Social History Narrative  . Not on file   Social Determinants of Health   Financial Resource Strain:   . Difficulty of Paying Living Expenses: Not on file  Food Insecurity:   . Worried About Charity fundraiser in the Last Year: Not on file  . Ran Out of Food in the Last Year: Not on file  Transportation Needs:   . Lack of Transportation (Medical): Not on file  . Lack of Transportation (Non-Medical): Not on file  Physical Activity:   . Days of Exercise per Week: Not on file  . Minutes of Exercise per Session: Not on file  Stress:   . Feeling of Stress : Not on file  Social Connections:   . Frequency of Communication with Friends and Family: Not on file  . Frequency of Social Gatherings with Friends and Family: Not on file  . Attends Religious Services: Not on file  . Active Member of Clubs or Organizations: Not on file  . Attends Archivist Meetings: Not on file  . Marital Status: Not on file  Intimate Partner Violence:   . Fear of Current or Ex-Partner: Not on file  . Emotionally Abused: Not on file  . Physically Abused: Not on file  . Sexually Abused: Not on file  The patient is divorced.  She lives in [place] and her 44 year old  granddaughter is also at home with her.  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  PAST FAMILY HISTORY: Family History  Problem Relation Age of Onset  . Stroke Mother   . Alzheimer's disease Mother   . Heart disease Mother   . Emphysema Father   . Hypertension Brother   . Heart attack Maternal Aunt   . Heart failure Maternal Grandmother   . Hypertension Paternal Grandmother     MEDICATIONS  Current Outpatient Medications  Medication Sig Dispense Refill  . diclofenac sodium (VOLTAREN) 1 % GEL Apply 2 g topically 4 (four) times daily.    . folic acid (FOLVITE) 1 MG tablet TAKE 1 TABLET(1 MG) BY MOUTH DAILY 30 tablet 4  . hydroxychloroquine (PLAQUENIL) 200 MG tablet TAKE 1 TABLET BY MOUTH TWICE DAILY MONDAY-FRIDAY 120 tablet 0  . naproxen sodium (ALEVE) 220 MG tablet Take 220 mg by mouth daily as needed (PAIN).    Marland Kitchen omeprazole (PRILOSEC) 20  MG capsule Take 20 mg by mouth daily.    Marland Kitchen Phenylephrine-DM-GG-APAP (DELSYM COUGH/COLD DAYTIME PO) Take by mouth as needed.     . prochlorperazine (COMPAZINE) 10 MG tablet TAKE 1 TABLET(10 MG) BY MOUTH EVERY 6 HOURS AS NEEDED FOR NAUSEA OR VOMITING 30 tablet 0  . pseudoephedrine-guaifenesin (MUCINEX D) 60-600 MG 12 hr tablet Take 1 tablet by mouth as needed.     Marland Kitchen VITAMIN D PO Take by mouth.    Marland Kitchen aspirin EC 81 MG tablet Take 81 mg by mouth daily. (Patient not taking: Reported on 03/01/2020)     No current facility-administered medications for this encounter.    ALLERGIES: No Known Allergies  PHYSICAL EXAM:  Unable to assess due to encounter type.   IMPRESSION/PLAN: 1. Progressive MetastaticStage IIIA, cT3N1-2M0 NSCLC, adenocarcinoma of the right lower lobe with brain metastases. The patient is doing well and we reviewed her most recent MRI which is reassuring. She will continue with MRI in 4 month's time for surveillance and her case will continue to be discussed at these intervals within the brain oncology conference. She will also  continue chemotherapy and immunotherapy with Dr. Julien Nordmann.  2. Changes of her scalp with hypopigmentation described and dry mouth. She's interested in meeting with dermatology and we'll send her for evaluation with Lennie Odor, [facility] at [facility], but also recommend she be evaluated as she's had prior thyroid disease in the past. A new referral was placed for Dr. Bryan Lemma at [facility]  Given current concerns for patient exposure during the COVID-19 pandemic, this encounter was conducted via telephone.  The patient has given verbal consent for this type of encounter. The time spent during this encounter was 30 minutes and 50% of that time was spent in preparation, discussion, and in the coordination of her care. The attendants for this meeting include Shona Simpson, [facility] and Farrel Gobble.  During the encounter, Shona Simpson [facility] was located at [facility] in the Radiation Oncology Department.  DENIQUA PERRY  was located at home.    Carola Rhine, PAC

## 2020-03-06 ENCOUNTER — Telehealth: Payer: Self-pay | Admitting: *Deleted

## 2020-03-06 NOTE — Telephone Encounter (Signed)
Sent patient a copy of MRI and CT scan per Shona Simpson, P.A.

## 2020-03-07 ENCOUNTER — Telehealth: Payer: Self-pay | Admitting: *Deleted

## 2020-03-07 NOTE — Telephone Encounter (Signed)
Called patient to inform of appt. with Susan Holder on 04-02-20 - arrival time- 11:15 am , address [address], phone number [phone], spoke with patient and she is aware of this appt.

## 2020-03-07 NOTE — Telephone Encounter (Signed)
CALLED PATIENT TO INFORM OF [place] APPT. WITH DR. Addison ON 05/11/20 @ 10 AM, SPOKE WITH PATIENT AND SHE IS AWARE OF THIS APPT.

## 2020-03-09 ENCOUNTER — Other Ambulatory Visit: Payer: Medicare PPO

## 2020-03-12 ENCOUNTER — Telehealth: Payer: Self-pay | Admitting: Radiation Oncology

## 2020-03-13 DIAGNOSIS — C3490 Malignant neoplasm of unspecified part of unspecified bronchus or lung: Secondary | ICD-10-CM | POA: Diagnosis not present

## 2020-03-14 ENCOUNTER — Telehealth: Payer: Self-pay

## 2020-03-14 ENCOUNTER — Other Ambulatory Visit: Payer: Self-pay | Admitting: Rheumatology

## 2020-03-14 DIAGNOSIS — M0579 Rheumatoid arthritis with rheumatoid factor of multiple sites without organ or systems involvement: Secondary | ICD-10-CM

## 2020-03-14 MED ORDER — HYDROXYCHLOROQUINE SULFATE 200 MG PO TABS: ORAL_TABLET | ORAL | 0 refills | Status: DC

## 2020-03-14 NOTE — Telephone Encounter (Signed)
See Rx Refill note for details.

## 2020-03-14 NOTE — Telephone Encounter (Signed)
Patient called requesting prescription refill of Hydroxychloroquine to be sent to [facility] at [facility].  Patient states she had her field of vision test with Dr. Tora Perches 2 months ago.  Plaquenil eye exam scanned into epic on 01/19/20.

## 2020-03-14 NOTE — Telephone Encounter (Signed)
Last Visit: 12/22/2019 Next Visit: 05/24/2020 Labs: 02/28/2020 RBC 3.21, Hgb 10.6, Hct 32.7, MCV 101.9, Glucose 106 Eye exam: 01/19/2020 WNL   Current Dose per office note 12/22/2019: Plaquenil 200 mg 1 tablet twice daily Monday through Friday only DX: Rheumatoid arthritis involving multiple sites with positive rheumatoid factor   Okay to refill Plaquenil?

## 2020-03-14 NOTE — Telephone Encounter (Signed)
Patient left a message requesting refill on Plaquenil.

## 2020-03-15 NOTE — Progress Notes (Signed)
[place] OFFICE PROGRESS NOTE  Patient, No Pcp Per No address on file  DIAGNOSIS:  Metastatic non-small cell lung cancer initially diagnosed as stage IIIA (T3,N1/N2, M0)non-small cell lung cancer, adenocarcinoma presented with large right lower lobe lung mass with extension to the right hilum and subcarinal area diagnosed in July 2019.  She has brain metastasis in October 2020.  Biomarker Findings Tumor Mutational Burden - TMB-Intermediate (6 Muts/Mb) Microsatellite status - MS-Stable Genomic Findings For a complete list of the genes assayed, please refer to the Appendix. NRAS Q61R ARAF amplification STK11 G56W KRAS G13D MYCN amplification MCL1 amplification NKX2-1 amplification - equivocal? TP53 G245V 7 Disease relevant genes with no reportable alterations: EGFR, ALK, BRAF, MET, ERBB2, RET, ROS1   PRIOR THERAPY: 1) Course of concurrent chemoradiation with weekly carboplatin for AUC of 2 and paclitaxel 45 mg/M2.  Status post 7 cycles.  Last dose was giving 01/11/2018. 2) Consolidation treatment with immunotherapy with Imfinzi (Durvalumab) 10 mg/KG every 2 weeks.  First dose February 09, 2018.  Status post 19 cycles. 3) status post stereotactic body radiotherapy to the enlarging right supraclavicular lymphadenopathy under the care of Dr. Lisbeth Renshaw. 4) SRS to multiple brain metastasis under the care of Dr. Lisbeth Renshaw.  CURRENT THERAPY: Systemic chemotherapy with carboplatin for AUC of 5, Alimta 500 mg/M2 and Keytruda 200 mg IV every 3 weeks.  First dose 08/16/2019.  Status post 10 cycles.  Starting from cycle #5 the patient is on maintenance treatment with Alimta and Keytruda every 3 weeks.  INTERVAL HISTORY: Susan Holder 64 y.o. female returns to the clinic for a follow up visit. The patient is feeling well today without any concerning complaints except for fatigue. The patient continues to tolerate treatment with Alimta and Keytruda well without any adverse side effects  except for fatigue. Denies any fever, chills, or night sweats. She lost weight but states she is trying to eat better and exercise more. Denies any chest pain, shortness of breath, cough, or hemoptysis. Denies any nausea, vomiting, diarrhea, or constipation. Denies any headache or visual changes. She follows with radiation oncology for her history of brain metastases. She recently had a brain MRI which was stable.  Denies any rashes or skin changes. She recently had a restaging CT scan performed. The patient is here today for evaluation and to review her scan prior to starting cycle # 11   MEDICAL HISTORY: Past Medical History:  Diagnosis Date  . met lung ca dx'd 09/2017   neck LN and brain 2020  . Rheumatoid arthritis ([employer] [place])     ALLERGIES:  has No Known Allergies.  MEDICATIONS:  Current Outpatient Medications  Medication Sig Dispense Refill  . aspirin EC 81 MG tablet Take 81 mg by mouth daily.     . diclofenac sodium (VOLTAREN) 1 % GEL Apply 2 g topically 4 (four) times daily.    . folic acid (FOLVITE) 1 MG tablet TAKE 1 TABLET(1 MG) BY MOUTH DAILY 30 tablet 4  . hydroxychloroquine (PLAQUENIL) 200 MG tablet Take 1 tablet by mouth twice daily Monday-Friday 120 tablet 0  . naproxen sodium (ALEVE) 220 MG tablet Take 220 mg by mouth daily as needed (PAIN).    Marland Kitchen omeprazole (PRILOSEC) 20 MG capsule Take 20 mg by mouth daily.    Marland Kitchen Phenylephrine-DM-GG-APAP (DELSYM COUGH/COLD DAYTIME PO) Take by mouth as needed.     . prochlorperazine (COMPAZINE) 10 MG tablet TAKE 1 TABLET(10 MG) BY MOUTH EVERY 6 HOURS AS NEEDED FOR NAUSEA OR VOMITING 30  tablet 0  . pseudoephedrine-guaifenesin (MUCINEX D) 60-600 MG 12 hr tablet Take 1 tablet by mouth as needed.     Marland Kitchen VITAMIN D PO Take by mouth.     No current facility-administered medications for this visit.    SURGICAL HISTORY:  Past Surgical History:  Procedure Laterality Date  . BACK SURGERY    . BRONCHIAL NEEDLE ASPIRATION BIOPSY  11/09/2017   Procedure:  BRONCHIAL NEEDLE ASPIRATION BIOPSIES;  Surgeon: Marshell Garfinkel, MD;  Location: [facility];  Service: Cardiopulmonary;;  . ENDOBRONCHIAL ULTRASOUND Bilateral 11/09/2017   Procedure: ENDOBRONCHIAL ULTRASOUND;  Surgeon: Marshell Garfinkel, MD;  Location: [facility];  Service: Cardiopulmonary;  Laterality: Bilateral;  . NASAL SINUS SURGERY    . NASAL SINUS SURGERY    . TUBAL LIGATION    . VIDEO BRONCHOSCOPY  11/09/2017   Procedure: VIDEO BRONCHOSCOPY;  Surgeon: Marshell Garfinkel, MD;  Location: [facility];  Service: Cardiopulmonary;;    REVIEW OF SYSTEMS:   Review of Systems  Constitutional: Positive for fatigue. Negative for appetite change, chills, fever and unexpected weight change.  HENT: Negative for mouth sores, nosebleeds, sore throat and trouble swallowing.   Eyes: Negative for eye problems and icterus.  Respiratory: Negative for cough, hemoptysis, shortness of breath and wheezing.   Cardiovascular: Negative for chest pain and leg swelling.  Gastrointestinal: Negative for abdominal pain, constipation, diarrhea, nausea and vomiting.  Genitourinary: Negative for bladder incontinence, difficulty urinating, dysuria, frequency and hematuria.   Musculoskeletal: Negative for back pain, gait problem, neck pain and neck stiffness.  Skin: Negative for itching and rash.  Neurological: Negative for dizziness, extremity weakness, gait problem, headaches, light-headedness and seizures.  Hematological: Negative for adenopathy. Does not bruise/bleed easily.  Psychiatric/Behavioral: Negative for confusion, depression and sleep disturbance. The patient is not nervous/anxious.     PHYSICAL EXAMINATION:  Blood pressure 124/64, pulse (!) 110, temperature 98 F (36.7 C), temperature source Tympanic, resp. rate 12, height 5' (1.524 m), weight 128 lb 12.8 oz (58.4 kg), last menstrual period 04/28/2000, SpO2 100 %.  ECOG PERFORMANCE STATUS: 1 - Symptomatic but completely ambulatory  Physical Exam   Constitutional: Oriented to person, place, and time and well-developed, well-nourished, and in no distress. HENT:  Head: Normocephalic and atraumatic.  Mouth/Throat: Oropharynx is clear and moist. No oropharyngeal exudate.  Eyes: Conjunctivae are normal. Right eye exhibits no discharge. Left eye exhibits no discharge. No scleral icterus.  Neck: Normal range of motion. Neck supple.  Cardiovascular: Normal rate, regular rhythm, normal heart sounds and intact distal pulses.   Pulmonary/Chest: Effort normal and breath sounds normal. No respiratory distress. No wheezes. No rales.  Abdominal: Soft. Bowel sounds are normal. Exhibits no distension and no mass. There is no tenderness.  Musculoskeletal: Normal range of motion. Exhibits no edema.  Lymphadenopathy:    No cervical adenopathy.  Neurological: Alert and oriented to person, place, and time. Exhibits normal muscle tone. Gait normal. Coordination normal.  Skin: Skin is warm and dry. No rash noted. Not diaphoretic. No erythema. No pallor.  Psychiatric: Mood, memory and judgment normal.  Vitals reviewed.  LABORATORY DATA: Lab Results  Component Value Date   WBC 8.6 03/19/2020   HGB 10.7 (L) 03/19/2020   HCT 33.1 (L) 03/19/2020   MCV 101.5 (H) 03/19/2020   PLT 399 03/19/2020      Chemistry      Component Value Date/Time   NA 138 02/28/2020 0858   K 3.7 02/28/2020 0858   CL 107 02/28/2020 0858   CO2 22 02/28/2020 0858  BUN 11 02/28/2020 0858   CREATININE 0.96 02/28/2020 0858   CREATININE 0.80 07/24/2017 1438      Component Value Date/Time   CALCIUM 10.3 02/28/2020 0858   ALKPHOS 69 02/28/2020 0858   AST 31 02/28/2020 0858   ALT 17 02/28/2020 0858   BILITOT 0.3 02/28/2020 0858       RADIOGRAPHIC STUDIES:  CT Chest W Contrast  Result Date: 03/02/2020 CLINICAL DATA:  Primary Cancer Type: Lung Imaging Indication: Assess response to therapy Interval therapy since last imaging? Yes Initial Cancer Diagnosis Date: 11/12/2017;  Established by: Biopsy-proven Detailed Pathology: Stage IIIA non-small cell lung cancer, adenocarcinoma. Primary Tumor location:  Right lower lobe.  Brain metastases. Surgeries: No. Chemotherapy: Yes; Ongoing? Yes; Most recent administration: 02/28/2020 Immunotherapy?  Yes; Type: Keytruda; Ongoing? Yes Radiation therapy? Yes Date Range: 03/09/2019; Target: Brain Date Range: 11/16/2018-12/20/2018; Target: Right supraclavicular nodes. Date Range: 11/30/2017-01/11/2018; Target: Right chest and regional nodes. EXAM: CT CHEST, ABDOMEN, AND PELVIS WITH CONTRAST TECHNIQUE: Multidetector CT imaging of the chest, abdomen and pelvis was performed following the standard protocol during bolus administration of intravenous contrast. CONTRAST:  132m OMNIPAQUE IOHEXOL 300 MG/ML  SOLN COMPARISON:  Most recent CT chest, abdomen, and pelvis 12/26/2019. 11/02/2018 PET-CT. FINDINGS: CT CHEST FINDINGS Cardiovascular: Calcified and noncalcified atheromatous plaque in the thoracic aorta with similar appearance to prior imaging. Normal appearance of central pulmonary vasculature on venous phase assessment. Heart size is normal with signs of coronary artery disease in RIGHT coronary circulation. Small pericardial effusion is unchanged compared to previous imaging. Mediastinum/Nodes: Mildly patulous esophagus similar to the prior exam. No axillary lymphadenopathy. No mediastinal adenopathy. Fullness of the RIGHT hilum following radiotherapy with unchanged appearance, see below. Lungs/Pleura: Stable appearance of post treatment changes about the RIGHT hilum. Background of pulmonary emphysema shows a similar appearance to the prior study. Cystic changes and septal thickening in the RIGHT lung apex are unchanged. Branching density in the LEFT lung base likely inspissated secretions within a peripheral bronchial (image 87, series 4) Musculoskeletal: No acute musculoskeletal process. See below for full musculoskeletal details. No destructive  findings about the bony thorax. No chest wall lesion. CT ABDOMEN PELVIS FINDINGS Hepatobiliary: No pericholecystic stranding. No biliary duct dilation. No focal, suspicious hepatic lesion. Pancreas: Normal. Spleen: Normal. Adrenals/Urinary Tract: Adrenal glands are normal. Symmetric renal enhancement. No hydronephrosis. Normal appearance of the urinary bladder which is under distended. Stomach/Bowel: Normal appendix. No acute gastrointestinal process.  Small bowel with normal caliber. Vascular/Lymphatic: Calcified and noncalcified atheromatous plaque in the abdominal aorta. There is no gastrohepatic or hepatoduodenal ligament lymphadenopathy. No retroperitoneal or mesenteric lymphadenopathy. No pelvic sidewall lymphadenopathy. Reproductive: Reproductive structures are unremarkable by CT. Other: No ascites. Musculoskeletal: No acute bone finding. No destructive bone process. Spinal degenerative changes as before. IMPRESSION: 1. Stable appearance of post treatment changes about the RIGHT hilum. 2. Resolution of areas of septal thickening and ground-glass in the LEFT chest likely related to inflammation or infection. 3. Branching density in the LEFT lung base likely inspissated secretions within a peripheral bronchial. Attention on follow-up. 4. No evidence of metastatic disease to the abdomen or pelvis. 5. Emphysema and aortic atherosclerosis. Aortic Atherosclerosis (ICD10-I70.0) and Emphysema (ICD10-J43.9). Electronically Signed   By: GZetta BillsM.D.   On: 03/02/2020 10:20   MR Brain W Wo Contrast  Result Date: 03/01/2020 CLINICAL DATA:  Brain/CNS neoplasm.  Surveillance. EXAM: MRI HEAD WITHOUT AND WITH CONTRAST TECHNIQUE: Multiplanar, multiecho pulse sequences of the brain and surrounding structures were obtained without and with intravenous contrast.  CONTRAST:  39m MULTIHANCE GADOBENATE DIMEGLUMINE 529 MG/ML IV SOLN COMPARISON:  MRI of the brain December 08, 2019. FINDINGS: Brain: No acute infarction,  hemorrhage, hydrocephalus or extra-axial collection. Multiple small enhancing lesion within the cerebellum and cerebral hemispheres, consistent with known metastases, unchanged from prior MRI, labeled on series 11: - left cerebellar hemisphere, 4 mm, image 37; - left cerebellar hemisphere, 7, image 40; - right cerebellar hemisphere, 3 mm, image 45; Inferior right frontal lobe, 3 mm, image 65; Left frontal operculum, 2 mm, image 91. No new lesion identified. Scattered foci of T2 hyperintensity within the white matter of the cerebral hemispheres, stable from prior MRI. Vascular: Normal flow voids. Skull and upper cervical spine: Normal marrow signal. Sinuses/Orbits: Negative. Other: Left mastoid effusion. IMPRESSION: 1. Unchanged multiple small enhancing lesion within the cerebellum and cerebral hemispheres, consistent with known metastases. No new lesion identified. 2. Left mastoid effusion. Electronically Signed   By: KPedro EarlsM.D.   On: 03/01/2020 17:37   CT Abdomen Pelvis W Contrast  Result Date: 03/02/2020 CLINICAL DATA:  Primary Cancer Type: Lung Imaging Indication: Assess response to therapy Interval therapy since last imaging? Yes Initial Cancer Diagnosis Date: 11/12/2017; Established by: Biopsy-proven Detailed Pathology: Stage IIIA non-small cell lung cancer, adenocarcinoma. Primary Tumor location:  Right lower lobe.  Brain metastases. Surgeries: No. Chemotherapy: Yes; Ongoing? Yes; Most recent administration: 02/28/2020 Immunotherapy?  Yes; Type: Keytruda; Ongoing? Yes Radiation therapy? Yes Date Range: 03/09/2019; Target: Brain Date Range: 11/16/2018-12/20/2018; Target: Right supraclavicular nodes. Date Range: 11/30/2017-01/11/2018; Target: Right chest and regional nodes. EXAM: CT CHEST, ABDOMEN, AND PELVIS WITH CONTRAST TECHNIQUE: Multidetector CT imaging of the chest, abdomen and pelvis was performed following the standard protocol during bolus administration of intravenous  contrast. CONTRAST:  1060mOMNIPAQUE IOHEXOL 300 MG/ML  SOLN COMPARISON:  Most recent CT chest, abdomen, and pelvis 12/26/2019. 11/02/2018 PET-CT. FINDINGS: CT CHEST FINDINGS Cardiovascular: Calcified and noncalcified atheromatous plaque in the thoracic aorta with similar appearance to prior imaging. Normal appearance of central pulmonary vasculature on venous phase assessment. Heart size is normal with signs of coronary artery disease in RIGHT coronary circulation. Small pericardial effusion is unchanged compared to previous imaging. Mediastinum/Nodes: Mildly patulous esophagus similar to the prior exam. No axillary lymphadenopathy. No mediastinal adenopathy. Fullness of the RIGHT hilum following radiotherapy with unchanged appearance, see below. Lungs/Pleura: Stable appearance of post treatment changes about the RIGHT hilum. Background of pulmonary emphysema shows a similar appearance to the prior study. Cystic changes and septal thickening in the RIGHT lung apex are unchanged. Branching density in the LEFT lung base likely inspissated secretions within a peripheral bronchial (image 87, series 4) Musculoskeletal: No acute musculoskeletal process. See below for full musculoskeletal details. No destructive findings about the bony thorax. No chest wall lesion. CT ABDOMEN PELVIS FINDINGS Hepatobiliary: No pericholecystic stranding. No biliary duct dilation. No focal, suspicious hepatic lesion. Pancreas: Normal. Spleen: Normal. Adrenals/Urinary Tract: Adrenal glands are normal. Symmetric renal enhancement. No hydronephrosis. Normal appearance of the urinary bladder which is under distended. Stomach/Bowel: Normal appendix. No acute gastrointestinal process.  Small bowel with normal caliber. Vascular/Lymphatic: Calcified and noncalcified atheromatous plaque in the abdominal aorta. There is no gastrohepatic or hepatoduodenal ligament lymphadenopathy. No retroperitoneal or mesenteric lymphadenopathy. No pelvic sidewall  lymphadenopathy. Reproductive: Reproductive structures are unremarkable by CT. Other: No ascites. Musculoskeletal: No acute bone finding. No destructive bone process. Spinal degenerative changes as before. IMPRESSION: 1. Stable appearance of post treatment changes about the RIGHT hilum. 2. Resolution of areas of septal  thickening and ground-glass in the LEFT chest likely related to inflammation or infection. 3. Branching density in the LEFT lung base likely inspissated secretions within a peripheral bronchial. Attention on follow-up. 4. No evidence of metastatic disease to the abdomen or pelvis. 5. Emphysema and aortic atherosclerosis. Aortic Atherosclerosis (ICD10-I70.0) and Emphysema (ICD10-J43.9). Electronically Signed   By: Zetta Bills M.D.   On: 03/02/2020 10:20     ASSESSMENT/PLAN:  This is a very pleasant 64 year old African American female with metastatic non-small cell lung cancer, adenocarcinoma initially diagnosed as IIIA in July 2019. She had evidence of metastatic disease with brain metastases in October 2020. She has no actionable mutations.   She is status post 7 cycles of concurrent chemoradiation with carboplatin for an AUC of 2 and paclitaxel 45 mg/m2. She had a partial response.   She then underwent consolidation immunotherapy with imfinzi. She is status post 18 cycles.   She completed SBRT to the right supraclavicular lymph node.  She had evidence of multiple brain metastases in October 2020. She underwent SRS treatment to these lesions under the care of Dr. Lisbeth Renshaw.  She currently is undergoing systemic chemotherapy with carboplatin for an AUC of 5, Alimta 500 mg/m2 and Keytruda 200 mg IV every 3 weeks. She is status post 10 cycles. Starting from cycle #5 she started maintenance alimta and [place].   The patient recently had a restaging CT scan performed. Dr. Julien Nordmann personally and independently reviewed the scan and discussed the results with the patient. The scan did not  show any evidence for disease progression. Recommend that she continue on the same treatment at the same dose. She will receive cycle #11 today as scheduled.   We will see her back for a follow up visit in 3 weeks for evaluation before starting cycle #12.   The patient was advised to call immediately if she has any concerning symptoms in the interval. The patient voices understanding of current disease status and treatment options and is in agreement with the current care plan. All questions were answered. The patient knows to call the clinic with any problems, questions or concerns. We can certainly see the patient much sooner if necessary   No orders of the defined types were placed in this encounter.    Lakeisa Heninger L Mavrick Mcquigg, PA-C 03/19/20  ADDENDUM: Hematology/Oncology Attending: I had a face-to-face encounter with the patient today.  I recommended her care plan.  This is a very pleasant 64 years old African-American female with metastatic non-small cell lung cancer, adenocarcinoma that was initially diagnosed as a stage IIIa in July 2019 status post concurrent chemoradiation followed by consolidation treatment with Imfinzi for 18 cycles.  The patient had evidence for disease progression with multiple brain metastasis in October 2020.  She started systemic chemotherapy with carboplatin, Alimta and Keytruda for 4 cycles of the induction treatment and now she is on maintenance treatment with Alimta and Keytruda for a total treatment of 10 cycles.  The patient has been tolerating her treatment well with no concerning adverse effects. She had repeat CT scan of the chest, abdomen pelvis performed recently.  I personally and independently reviewed the scans and discussed the results with the patient today. Her scan showed no concerning findings for disease progression. I recommended for the patient to continue her current maintenance treatment with Alimta and Keytruda and she will proceed with  cycle #11 today. The patient was advised to call immediately if she has any concerning symptoms in the interval.  Disclaimer: This note was dictated with voice recognition software. Similar sounding words can inadvertently be transcribed and may be missed upon review. Eilleen Kempf, MD 03/19/20

## 2020-03-19 ENCOUNTER — Inpatient Hospital Stay: Payer: Medicare PPO

## 2020-03-19 ENCOUNTER — Encounter: Payer: Self-pay | Admitting: Physician Assistant

## 2020-03-19 ENCOUNTER — Other Ambulatory Visit: Payer: Self-pay

## 2020-03-19 ENCOUNTER — Inpatient Hospital Stay: Payer: Medicare PPO | Admitting: Physician Assistant

## 2020-03-19 VITALS — HR 108

## 2020-03-19 VITALS — BP 124/64 | HR 110 | Temp 98.0°F | Resp 12 | Ht 60.0 in | Wt 128.8 lb

## 2020-03-19 DIAGNOSIS — Z5111 Encounter for antineoplastic chemotherapy: Secondary | ICD-10-CM | POA: Diagnosis not present

## 2020-03-19 DIAGNOSIS — Z923 Personal history of irradiation: Secondary | ICD-10-CM | POA: Diagnosis not present

## 2020-03-19 DIAGNOSIS — C3431 Malignant neoplasm of lower lobe, right bronchus or lung: Secondary | ICD-10-CM

## 2020-03-19 DIAGNOSIS — Z79899 Other long term (current) drug therapy: Secondary | ICD-10-CM | POA: Diagnosis not present

## 2020-03-19 DIAGNOSIS — Z7982 Long term (current) use of aspirin: Secondary | ICD-10-CM | POA: Diagnosis not present

## 2020-03-19 DIAGNOSIS — Z5112 Encounter for antineoplastic immunotherapy: Secondary | ICD-10-CM

## 2020-03-19 DIAGNOSIS — C3491 Malignant neoplasm of unspecified part of right bronchus or lung: Secondary | ICD-10-CM

## 2020-03-19 DIAGNOSIS — M069 Rheumatoid arthritis, unspecified: Secondary | ICD-10-CM | POA: Diagnosis not present

## 2020-03-19 DIAGNOSIS — C770 Secondary and unspecified malignant neoplasm of lymph nodes of head, face and neck: Secondary | ICD-10-CM | POA: Diagnosis not present

## 2020-03-19 DIAGNOSIS — C7931 Secondary malignant neoplasm of brain: Secondary | ICD-10-CM | POA: Diagnosis not present

## 2020-03-19 LAB — CBC WITH DIFFERENTIAL (CANCER CENTER ONLY)
Abs Immature Granulocytes: 0.02 10*3/uL (ref 0.00–0.07)
Basophils Absolute: 0 10*3/uL (ref 0.0–0.1)
Basophils Relative: 0 %
Eosinophils Absolute: 0 10*3/uL (ref 0.0–0.5)
Eosinophils Relative: 0 %
HCT: 33.1 % — ABNORMAL LOW (ref 36.0–46.0)
Hemoglobin: 10.7 g/dL — ABNORMAL LOW (ref 12.0–15.0)
Immature Granulocytes: 0 %
Lymphocytes Relative: 10 %
Lymphs Abs: 0.9 10*3/uL (ref 0.7–4.0)
MCH: 32.8 pg (ref 26.0–34.0)
MCHC: 32.3 g/dL (ref 30.0–36.0)
MCV: 101.5 fL — ABNORMAL HIGH (ref 80.0–100.0)
Monocytes Absolute: 0.6 10*3/uL (ref 0.1–1.0)
Monocytes Relative: 7 %
Neutro Abs: 7.1 10*3/uL (ref 1.7–7.7)
Neutrophils Relative %: 83 %
Platelet Count: 399 10*3/uL (ref 150–400)
RBC: 3.26 MIL/uL — ABNORMAL LOW (ref 3.87–5.11)
RDW: 14.7 % (ref 11.5–15.5)
WBC Count: 8.6 10*3/uL (ref 4.0–10.5)
nRBC: 0 % (ref 0.0–0.2)

## 2020-03-19 LAB — CMP (CANCER CENTER ONLY)
ALT: 12 U/L (ref 0–44)
AST: 28 U/L (ref 15–41)
Albumin: 3.4 g/dL — ABNORMAL LOW (ref 3.5–5.0)
Alkaline Phosphatase: 70 U/L (ref 38–126)
Anion gap: 10 (ref 5–15)
BUN: 8 mg/dL (ref 8–23)
CO2: 23 mmol/L (ref 22–32)
Calcium: 10.1 mg/dL (ref 8.9–10.3)
Chloride: 107 mmol/L (ref 98–111)
Creatinine: 0.99 mg/dL (ref 0.44–1.00)
GFR, Estimated: >60 mL/min (ref >60)
Glucose, Bld: 103 mg/dL — ABNORMAL HIGH (ref 70–99)
Potassium: 3.5 mmol/L (ref 3.5–5.1)
Sodium: 140 mmol/L (ref 135–145)
Total Bilirubin: 0.2 mg/dL — ABNORMAL LOW (ref 0.3–1.2)
Total Protein: 7.9 g/dL (ref 6.5–8.1)

## 2020-03-19 LAB — TSH: TSH: 1.495 u[IU]/mL (ref 0.308–3.960)

## 2020-03-19 MED ORDER — SODIUM CHLORIDE 0.9 % IV SOLN
Freq: Once | INTRAVENOUS | Status: AC
  Filled 2020-03-19: qty 250

## 2020-03-19 MED ORDER — CYANOCOBALAMIN 1000 MCG/ML IJ SOLN
1000.0000 ug | Freq: Once | INTRAMUSCULAR | Status: AC
  Administered 2020-03-19: 1000 ug via INTRAMUSCULAR

## 2020-03-19 MED ORDER — SODIUM CHLORIDE 0.9 % IV SOLN
200.0000 mg | Freq: Once | INTRAVENOUS | Status: AC
  Administered 2020-03-19: 200 mg via INTRAVENOUS
  Filled 2020-03-19: qty 8

## 2020-03-19 MED ORDER — SODIUM CHLORIDE 0.9 % IV SOLN
500.0000 mg/m2 | Freq: Once | INTRAVENOUS | Status: AC
  Administered 2020-03-19: 800 mg via INTRAVENOUS
  Filled 2020-03-19: qty 20

## 2020-03-19 MED ORDER — PROCHLORPERAZINE MALEATE 10 MG PO TABS
10.0000 mg | ORAL_TABLET | Freq: Once | ORAL | Status: AC
  Administered 2020-03-19: 10 mg via ORAL

## 2020-03-19 MED ORDER — CYANOCOBALAMIN 1000 MCG/ML IJ SOLN
INTRAMUSCULAR | Status: AC
  Filled 2020-03-19: qty 1

## 2020-03-19 MED ORDER — PROCHLORPERAZINE MALEATE 10 MG PO TABS
ORAL_TABLET | ORAL | Status: AC
  Filled 2020-03-19: qty 1

## 2020-03-19 NOTE — Progress Notes (Signed)
Per Cassie Heilingoetter, PA okay to treat with HR 108.

## 2020-03-19 NOTE — Patient Instructions (Signed)
[facility] Discharge Instructions for Patients Receiving Chemotherapy  Today you received the following chemotherapy agents: pembrolizumab and pemetrexed.  To help prevent nausea and vomiting after your treatment, we encourage you to take your nausea medication as directed.   If you develop nausea and vomiting that is not controlled by your nausea medication, call the clinic.   BELOW ARE SYMPTOMS THAT SHOULD BE REPORTED IMMEDIATELY:  *FEVER GREATER THAN 100.5 F  *CHILLS WITH OR WITHOUT FEVER  NAUSEA AND VOMITING THAT IS NOT CONTROLLED WITH YOUR NAUSEA MEDICATION  *UNUSUAL SHORTNESS OF BREATH  *UNUSUAL BRUISING OR BLEEDING  TENDERNESS IN MOUTH AND THROAT WITH OR WITHOUT PRESENCE OF ULCERS  *URINARY PROBLEMS  *BOWEL PROBLEMS  UNUSUAL RASH Items with * indicate a potential emergency and should be followed up as soon as possible.  Feel free to call the clinic should you have any questions or concerns. The clinic phone number is [phone].  Please show the [name] at check-in to the Emergency Department and triage nurse.

## 2020-03-31 ENCOUNTER — Other Ambulatory Visit: Payer: Medicare PPO

## 2020-04-02 DIAGNOSIS — L821 Other seborrheic keratosis: Secondary | ICD-10-CM | POA: Diagnosis not present

## 2020-04-02 DIAGNOSIS — L80 Vitiligo: Secondary | ICD-10-CM | POA: Diagnosis not present

## 2020-04-02 DIAGNOSIS — L218 Other seborrheic dermatitis: Secondary | ICD-10-CM | POA: Diagnosis not present

## 2020-04-09 ENCOUNTER — Other Ambulatory Visit: Payer: Self-pay

## 2020-04-09 ENCOUNTER — Inpatient Hospital Stay: Payer: Medicare PPO | Attending: Internal Medicine | Admitting: Internal Medicine

## 2020-04-09 ENCOUNTER — Inpatient Hospital Stay: Payer: Medicare PPO

## 2020-04-09 ENCOUNTER — Encounter: Payer: Self-pay | Admitting: Internal Medicine

## 2020-04-09 VITALS — BP 148/75 | HR 112 | Temp 97.5°F | Resp 18 | Ht 60.0 in | Wt 123.2 lb

## 2020-04-09 VITALS — HR 105

## 2020-04-09 DIAGNOSIS — C7931 Secondary malignant neoplasm of brain: Secondary | ICD-10-CM | POA: Insufficient documentation

## 2020-04-09 DIAGNOSIS — Z5111 Encounter for antineoplastic chemotherapy: Secondary | ICD-10-CM | POA: Insufficient documentation

## 2020-04-09 DIAGNOSIS — Z7982 Long term (current) use of aspirin: Secondary | ICD-10-CM | POA: Diagnosis not present

## 2020-04-09 DIAGNOSIS — R1310 Dysphagia, unspecified: Secondary | ICD-10-CM | POA: Diagnosis not present

## 2020-04-09 DIAGNOSIS — E876 Hypokalemia: Secondary | ICD-10-CM | POA: Diagnosis not present

## 2020-04-09 DIAGNOSIS — C3431 Malignant neoplasm of lower lobe, right bronchus or lung: Secondary | ICD-10-CM | POA: Diagnosis not present

## 2020-04-09 DIAGNOSIS — C3491 Malignant neoplasm of unspecified part of right bronchus or lung: Secondary | ICD-10-CM

## 2020-04-09 DIAGNOSIS — Z5112 Encounter for antineoplastic immunotherapy: Secondary | ICD-10-CM | POA: Diagnosis not present

## 2020-04-09 DIAGNOSIS — D6481 Anemia due to antineoplastic chemotherapy: Secondary | ICD-10-CM | POA: Diagnosis not present

## 2020-04-09 DIAGNOSIS — M069 Rheumatoid arthritis, unspecified: Secondary | ICD-10-CM | POA: Insufficient documentation

## 2020-04-09 DIAGNOSIS — Z923 Personal history of irradiation: Secondary | ICD-10-CM | POA: Insufficient documentation

## 2020-04-09 DIAGNOSIS — Z79899 Other long term (current) drug therapy: Secondary | ICD-10-CM | POA: Insufficient documentation

## 2020-04-09 LAB — CBC WITH DIFFERENTIAL (CANCER CENTER ONLY)
Abs Immature Granulocytes: 0.02 10*3/uL (ref 0.00–0.07)
Basophils Absolute: 0 10*3/uL (ref 0.0–0.1)
Basophils Relative: 1 %
Eosinophils Absolute: 0 10*3/uL (ref 0.0–0.5)
Eosinophils Relative: 1 %
HCT: 29.1 % — ABNORMAL LOW (ref 36.0–46.0)
Hemoglobin: 9.3 g/dL — ABNORMAL LOW (ref 12.0–15.0)
Immature Granulocytes: 0 %
Lymphocytes Relative: 18 %
Lymphs Abs: 1 10*3/uL (ref 0.7–4.0)
MCH: 32.6 pg (ref 26.0–34.0)
MCHC: 32 g/dL (ref 30.0–36.0)
MCV: 102.1 fL — ABNORMAL HIGH (ref 80.0–100.0)
Monocytes Absolute: 0.6 10*3/uL (ref 0.1–1.0)
Monocytes Relative: 11 %
Neutro Abs: 4.1 10*3/uL (ref 1.7–7.7)
Neutrophils Relative %: 69 %
Platelet Count: 388 10*3/uL (ref 150–400)
RBC: 2.85 MIL/uL — ABNORMAL LOW (ref 3.87–5.11)
RDW: 15.5 % (ref 11.5–15.5)
WBC Count: 5.9 10*3/uL (ref 4.0–10.5)
nRBC: 0 % (ref 0.0–0.2)

## 2020-04-09 LAB — CMP (CANCER CENTER ONLY)
ALT: 13 U/L (ref 0–44)
AST: 28 U/L (ref 15–41)
Albumin: 3 g/dL — ABNORMAL LOW (ref 3.5–5.0)
Alkaline Phosphatase: 61 U/L (ref 38–126)
Anion gap: 8 (ref 5–15)
BUN: 9 mg/dL (ref 8–23)
CO2: 26 mmol/L (ref 22–32)
Calcium: 10.3 mg/dL (ref 8.9–10.3)
Chloride: 107 mmol/L (ref 98–111)
Creatinine: 0.93 mg/dL (ref 0.44–1.00)
GFR, Estimated: >60 mL/min (ref >60)
Glucose, Bld: 81 mg/dL (ref 70–99)
Potassium: 3.4 mmol/L — ABNORMAL LOW (ref 3.5–5.1)
Sodium: 141 mmol/L (ref 135–145)
Total Bilirubin: 0.2 mg/dL — ABNORMAL LOW (ref 0.3–1.2)
Total Protein: 7.5 g/dL (ref 6.5–8.1)

## 2020-04-09 LAB — TSH: TSH: 1.262 u[IU]/mL (ref 0.308–3.960)

## 2020-04-09 MED ORDER — SODIUM CHLORIDE 0.9 % IV SOLN
500.0000 mg/m2 | Freq: Once | INTRAVENOUS | Status: AC
  Administered 2020-04-09: 800 mg via INTRAVENOUS
  Filled 2020-04-09: qty 20

## 2020-04-09 MED ORDER — PROCHLORPERAZINE MALEATE 10 MG PO TABS
ORAL_TABLET | ORAL | Status: AC
  Filled 2020-04-09: qty 1

## 2020-04-09 MED ORDER — PROCHLORPERAZINE MALEATE 10 MG PO TABS
10.0000 mg | ORAL_TABLET | Freq: Once | ORAL | Status: AC
  Administered 2020-04-09: 10 mg via ORAL

## 2020-04-09 MED ORDER — SODIUM CHLORIDE 0.9 % IV SOLN
200.0000 mg | Freq: Once | INTRAVENOUS | Status: AC
  Administered 2020-04-09: 200 mg via INTRAVENOUS
  Filled 2020-04-09: qty 8

## 2020-04-09 MED ORDER — SODIUM CHLORIDE 0.9 % IV SOLN
Freq: Once | INTRAVENOUS | Status: AC
  Filled 2020-04-09: qty 250

## 2020-04-09 NOTE — Patient Instructions (Addendum)
[place] Discharge Instructions for Patients Receiving Chemotherapy  Today you received the following chemotherapy agents: pembrolizumab and pemetrexed.  To help prevent nausea and vomiting after your treatment, we encourage you to take your nausea medication as directed.   If you develop nausea and vomiting that is not controlled by your nausea medication, call the clinic.   BELOW ARE SYMPTOMS THAT SHOULD BE REPORTED IMMEDIATELY:  *FEVER GREATER THAN 100.5 F  *CHILLS WITH OR WITHOUT FEVER  NAUSEA AND VOMITING THAT IS NOT CONTROLLED WITH YOUR NAUSEA MEDICATION  *UNUSUAL SHORTNESS OF BREATH  *UNUSUAL BRUISING OR BLEEDING  TENDERNESS IN MOUTH AND THROAT WITH OR WITHOUT PRESENCE OF ULCERS  *URINARY PROBLEMS  *BOWEL PROBLEMS  UNUSUAL RASH Items with * indicate a potential emergency and should be followed up as soon as possible.  Feel free to call the clinic should you have any questions or concerns. The clinic phone number is [phone].  Please show the [employer] at check-in to the Emergency Department and triage nurse.  [place] Discharge Instructions for Patients Receiving Chemotherapy  Today you received the following chemotherapy agents: pembrolizumab and pemetrexed.  To help prevent nausea and vomiting after your treatment, we encourage you to take your nausea medication as directed.   If you develop nausea and vomiting that is not controlled by your nausea medication, call the clinic.   BELOW ARE SYMPTOMS THAT SHOULD BE REPORTED IMMEDIATELY:  *FEVER GREATER THAN 100.5 F  *CHILLS WITH OR WITHOUT FEVER  NAUSEA AND VOMITING THAT IS NOT CONTROLLED WITH YOUR NAUSEA MEDICATION  *UNUSUAL SHORTNESS OF BREATH  *UNUSUAL BRUISING OR BLEEDING  TENDERNESS IN MOUTH AND THROAT WITH OR WITHOUT PRESENCE OF ULCERS  *URINARY PROBLEMS  *BOWEL PROBLEMS  UNUSUAL RASH Items with * indicate a potential emergency and should be  followed up as soon as possible.  Feel free to call the clinic should you have any questions or concerns. The clinic phone number is [phone].  Please show the [place] at check-in to the Emergency Department and triage nurse.

## 2020-04-09 NOTE — Progress Notes (Signed)
Per Dr. Julien Nordmann, ok to treat with HR 105.

## 2020-04-09 NOTE — Progress Notes (Signed)
[facility] [phone]   [phone]) [phone]  OFFICE PROGRESS NOTE  Patient, No Pcp Per No address on file  DIAGNOSIS: Metastatic non-small cell lung cancer initially diagnosed as stage IIIA (T3, N1/N2, M0) non-small cell lung cancer, adenocarcinoma presented with large right lower lobe lung mass with extension to the right hilum and subcarinal area diagnosed in July 2019.  She has brain metastasis in October 2020.  Biomarker Findings Tumor Mutational Burden - TMB-Intermediate (6 Muts/Mb) Microsatellite status - MS-Stable Genomic Findings For a complete list of the genes assayed, please refer to the Appendix. NRAS Q61R ARAF amplification STK11 G56W KRAS G13D MYCN amplification MCL1 amplification NKX2-1 amplification - equivocal? TP53 G245V 7 Disease relevant genes with no reportable alterations: EGFR, ALK, BRAF, MET, ERBB2, RET, ROS1   PRIOR THERAPY:  1) Course of concurrent chemoradiation with weekly carboplatin for AUC of 2 and paclitaxel 45 mg/M2.  Status post 7 cycles.  Last dose was giving 01/11/2018. 2) Consolidation treatment with immunotherapy with Imfinzi (Durvalumab) 10 mg/KG every 2 weeks.  First dose February 09, 2018.  Status post 19 cycles. 3) status post stereotactic body radiotherapy to the enlarging right supraclavicular lymphadenopathy under the care of Dr. Lisbeth Renshaw. 4) SRS to multiple brain metastasis under the care of Dr. Lisbeth Renshaw.  CURRENT THERAPY: Systemic chemotherapy with carboplatin for AUC of 5, Alimta 500 mg/M2 and Keytruda 200 mg IV every 3 weeks.  First dose 08/16/2019.  Status post 11 cycles.  Starting from cycle #5 the patient is on maintenance treatment with Alimta and Keytruda every 3 weeks.  INTERVAL HISTORY: SHAIDA ROUTE 64 y.o. female returns to the clinic today for follow-up visit. The patient is feeling fine today with no concerning complaints except for mild fatigue. She denied having any current chest pain,  shortness of breath except with exertion with no cough or hemoptysis. She denied having any weight loss or night sweats. She has no nausea, vomiting, diarrhea or constipation. She has no headache or visual changes. She is here today for evaluation before starting cycle #12 of her treatment.   MEDICAL HISTORY: Past Medical History:  Diagnosis Date  . met lung ca dx'd 09/2017   neck LN and brain 2020  . Rheumatoid arthritis ([place])     ALLERGIES:  has No Known Allergies.  MEDICATIONS:  Current Outpatient Medications  Medication Sig Dispense Refill  . aspirin EC 81 MG tablet Take 81 mg by mouth daily.     . diclofenac sodium (VOLTAREN) 1 % GEL Apply 2 g topically 4 (four) times daily.    . folic acid (FOLVITE) 1 MG tablet TAKE 1 TABLET(1 MG) BY MOUTH DAILY 30 tablet 4  . hydroxychloroquine (PLAQUENIL) 200 MG tablet Take 1 tablet by mouth twice daily Monday-Friday 120 tablet 0  . naproxen sodium (ALEVE) 220 MG tablet Take 220 mg by mouth daily as needed (PAIN).    Marland Kitchen omeprazole (PRILOSEC) 20 MG capsule Take 20 mg by mouth daily.    Marland Kitchen Phenylephrine-DM-GG-APAP (DELSYM COUGH/COLD DAYTIME PO) Take by mouth as needed.     . prochlorperazine (COMPAZINE) 10 MG tablet TAKE 1 TABLET(10 MG) BY MOUTH EVERY 6 HOURS AS NEEDED FOR NAUSEA OR VOMITING 30 tablet 0  . pseudoephedrine-guaifenesin (MUCINEX D) 60-600 MG 12 hr tablet Take 1 tablet by mouth as needed.     Marland Kitchen VITAMIN D PO Take by mouth.     No current facility-administered medications for this visit.    SURGICAL HISTORY:  Past  Surgical History:  Procedure Laterality Date  . BACK SURGERY    . BRONCHIAL NEEDLE ASPIRATION BIOPSY  11/09/2017   Procedure: BRONCHIAL NEEDLE ASPIRATION BIOPSIES;  Surgeon: Mannam, Praveen, MD;  Location: [facility];  Service: Cardiopulmonary;;  . ENDOBRONCHIAL ULTRASOUND Bilateral 11/09/2017   Procedure: ENDOBRONCHIAL ULTRASOUND;  Surgeon: Mannam, Praveen, MD;  Location: [facility];  Service: Cardiopulmonary;   Laterality: Bilateral;  . NASAL SINUS SURGERY    . NASAL SINUS SURGERY    . TUBAL LIGATION    . VIDEO BRONCHOSCOPY  11/09/2017   Procedure: VIDEO BRONCHOSCOPY;  Surgeon: Mannam, Praveen, MD;  Location: [facility];  Service: Cardiopulmonary;;    REVIEW OF SYSTEMS:  A comprehensive review of systems was negative except for: Constitutional: positive for fatigue Respiratory: positive for dyspnea on exertion   PHYSICAL EXAMINATION: General appearance: alert, cooperative, fatigued and no distress Head: Normocephalic, without obvious abnormality, atraumatic Neck: no adenopathy, no JVD, supple, symmetrical, trachea midline and thyroid not enlarged, symmetric, no tenderness/mass/nodules Lymph nodes: Cervical, supraclavicular, and axillary nodes normal. Resp: clear to auscultation bilaterally Back: symmetric, no curvature. ROM normal. No CVA tenderness. Cardio: regular rate and rhythm, S1, S2 normal, no murmur, click, rub or gallop GI: soft, non-tender; bowel sounds normal; no masses,  no organomegaly Extremities: extremities normal, atraumatic, no cyanosis or edema  ECOG PERFORMANCE STATUS: 1 - Symptomatic but completely ambulatory  Blood pressure (!) 148/75, pulse (!) 112, temperature (!) 97.5 F (36.4 C), temperature source Tympanic, resp. rate 18, height 5' (1.524 m), weight 123 lb 3.2 oz (55.9 kg), last menstrual period 04/28/2000, SpO2 100 %.  LABORATORY DATA: Lab Results  Component Value Date   WBC 5.9 04/09/2020   HGB 9.3 (L) 04/09/2020   HCT 29.1 (L) 04/09/2020   MCV 102.1 (H) 04/09/2020   PLT 388 04/09/2020      Chemistry      Component Value Date/Time   NA 140 03/19/2020 0815   K 3.5 03/19/2020 0815   CL 107 03/19/2020 0815   CO2 23 03/19/2020 0815   BUN 8 03/19/2020 0815   CREATININE 0.99 03/19/2020 0815   CREATININE 0.80 07/24/2017 1438      Component Value Date/Time   CALCIUM 10.1 03/19/2020 0815   ALKPHOS 70 03/19/2020 0815   AST 28 03/19/2020 0815   ALT 12  03/19/2020 0815   BILITOT 0.2 (L) 03/19/2020 0815       RADIOGRAPHIC STUDIES: No results found.  ASSESSMENT AND PLAN: This is a very pleasant 64 years old African-American female with metastatic non-small cell lung cancer initially diagnosed with a stage IIIA non-small cell lung cancer, adenocarcinoma.  She underwent a course of concurrent chemoradiation with weekly carboplatin and paclitaxel status post 7 cycles with partial response.   The patient tolerated this course of treatment well except for mild odynophagia and dysphagia. She completed on consolidation treatment with immunotherapy with Imfinzi (Durvalumab) status post 18 cycles. She also completed SBRT to the right supraclavicular lymphadenopathy. The patient had evidence for multiple brain metastasis in October 2020 and she underwent SRS treatment to this lesion under the care of Dr. Moody. The patient had evidence for disease progression and she started systemic chemotherapy with carboplatin, Alimta and Keytruda status post 11 cycles.  Starting from cycle #5 she is on maintenance treatment with Alimta and Keytruda every 3 weeks. The patient has been tolerating this treatment well with no concerning adverse effects except for fatigue. I recommended for her to proceed with cycle #12 today as planned. I will see   her back for follow-up visit in 3 weeks for evaluation before starting cycle #13. We will arrange for her restaging scan to be done after the next cycle of her treatment. For the chemotherapy-induced anemia, I advised the patient to start taking multivitamins and iron tablets at least once daily. For the mild hypokalemia, she was advised to increase her potassium rich diet. For the dry mouth she will continue with Biotene and salivasure as needed. The patient was advised to call immediately if she has any concerning symptoms in the interval. The patient voices understanding of current disease status and treatment options and is  in agreement with the current care plan. All questions were answered. The patient knows to call the clinic with any problems, questions or concerns. We can certainly see the patient much sooner if necessary.  Disclaimer: This note was dictated with voice recognition software. Similar sounding words can inadvertently be transcribed and may not be corrected upon review.       

## 2020-04-12 DIAGNOSIS — C3490 Malignant neoplasm of unspecified part of unspecified bronchus or lung: Secondary | ICD-10-CM | POA: Diagnosis not present

## 2020-05-01 ENCOUNTER — Other Ambulatory Visit: Payer: Self-pay

## 2020-05-01 ENCOUNTER — Inpatient Hospital Stay: Payer: Medicare PPO

## 2020-05-01 ENCOUNTER — Inpatient Hospital Stay: Payer: Medicare PPO | Attending: Internal Medicine | Admitting: Internal Medicine

## 2020-05-01 VITALS — BP 143/68 | HR 102 | Temp 97.7°F | Resp 17 | Ht 60.0 in | Wt 118.6 lb

## 2020-05-01 VITALS — HR 100

## 2020-05-01 DIAGNOSIS — C3431 Malignant neoplasm of lower lobe, right bronchus or lung: Secondary | ICD-10-CM | POA: Diagnosis not present

## 2020-05-01 DIAGNOSIS — Z5111 Encounter for antineoplastic chemotherapy: Secondary | ICD-10-CM | POA: Insufficient documentation

## 2020-05-01 DIAGNOSIS — Z791 Long term (current) use of non-steroidal anti-inflammatories (NSAID): Secondary | ICD-10-CM | POA: Diagnosis not present

## 2020-05-01 DIAGNOSIS — C3491 Malignant neoplasm of unspecified part of right bronchus or lung: Secondary | ICD-10-CM

## 2020-05-01 DIAGNOSIS — Z923 Personal history of irradiation: Secondary | ICD-10-CM | POA: Insufficient documentation

## 2020-05-01 DIAGNOSIS — E876 Hypokalemia: Secondary | ICD-10-CM | POA: Insufficient documentation

## 2020-05-01 DIAGNOSIS — D6481 Anemia due to antineoplastic chemotherapy: Secondary | ICD-10-CM | POA: Insufficient documentation

## 2020-05-01 DIAGNOSIS — C7931 Secondary malignant neoplasm of brain: Secondary | ICD-10-CM | POA: Diagnosis not present

## 2020-05-01 DIAGNOSIS — M069 Rheumatoid arthritis, unspecified: Secondary | ICD-10-CM | POA: Diagnosis not present

## 2020-05-01 DIAGNOSIS — Z79899 Other long term (current) drug therapy: Secondary | ICD-10-CM | POA: Insufficient documentation

## 2020-05-01 DIAGNOSIS — R682 Dry mouth, unspecified: Secondary | ICD-10-CM | POA: Insufficient documentation

## 2020-05-01 DIAGNOSIS — Z5112 Encounter for antineoplastic immunotherapy: Secondary | ICD-10-CM | POA: Insufficient documentation

## 2020-05-01 DIAGNOSIS — C770 Secondary and unspecified malignant neoplasm of lymph nodes of head, face and neck: Secondary | ICD-10-CM | POA: Insufficient documentation

## 2020-05-01 DIAGNOSIS — Z7982 Long term (current) use of aspirin: Secondary | ICD-10-CM | POA: Insufficient documentation

## 2020-05-01 DIAGNOSIS — C3490 Malignant neoplasm of unspecified part of unspecified bronchus or lung: Secondary | ICD-10-CM | POA: Diagnosis not present

## 2020-05-01 LAB — CMP (CANCER CENTER ONLY)
ALT: 13 U/L (ref 0–44)
AST: 35 U/L (ref 15–41)
Albumin: 3.4 g/dL — ABNORMAL LOW (ref 3.5–5.0)
Alkaline Phosphatase: 66 U/L (ref 38–126)
Anion gap: 10 (ref 5–15)
BUN: 9 mg/dL (ref 8–23)
CO2: 23 mmol/L (ref 22–32)
Calcium: 10.6 mg/dL — ABNORMAL HIGH (ref 8.9–10.3)
Chloride: 106 mmol/L (ref 98–111)
Creatinine: 1 mg/dL (ref 0.44–1.00)
GFR, Estimated: >60 mL/min (ref >60)
Glucose, Bld: 81 mg/dL (ref 70–99)
Potassium: 3.6 mmol/L (ref 3.5–5.1)
Sodium: 139 mmol/L (ref 135–145)
Total Bilirubin: 0.3 mg/dL (ref 0.3–1.2)
Total Protein: 8.1 g/dL (ref 6.5–8.1)

## 2020-05-01 LAB — CBC WITH DIFFERENTIAL (CANCER CENTER ONLY)
Abs Immature Granulocytes: 0.03 10*3/uL (ref 0.00–0.07)
Basophils Absolute: 0 10*3/uL (ref 0.0–0.1)
Basophils Relative: 0 %
Eosinophils Absolute: 0 10*3/uL (ref 0.0–0.5)
Eosinophils Relative: 0 %
HCT: 32.4 % — ABNORMAL LOW (ref 36.0–46.0)
Hemoglobin: 10.4 g/dL — ABNORMAL LOW (ref 12.0–15.0)
Immature Granulocytes: 0 %
Lymphocytes Relative: 10 %
Lymphs Abs: 0.8 10*3/uL (ref 0.7–4.0)
MCH: 33.3 pg (ref 26.0–34.0)
MCHC: 32.1 g/dL (ref 30.0–36.0)
MCV: 103.8 fL — ABNORMAL HIGH (ref 80.0–100.0)
Monocytes Absolute: 0.5 10*3/uL (ref 0.1–1.0)
Monocytes Relative: 6 %
Neutro Abs: 7.1 10*3/uL (ref 1.7–7.7)
Neutrophils Relative %: 84 %
Platelet Count: 409 10*3/uL — ABNORMAL HIGH (ref 150–400)
RBC: 3.12 MIL/uL — ABNORMAL LOW (ref 3.87–5.11)
RDW: 15.2 % (ref 11.5–15.5)
WBC Count: 8.6 10*3/uL (ref 4.0–10.5)
nRBC: 0 % (ref 0.0–0.2)

## 2020-05-01 LAB — TSH: TSH: 1.726 u[IU]/mL (ref 0.308–3.960)

## 2020-05-01 MED ORDER — SODIUM CHLORIDE 0.9 % IV SOLN
200.0000 mg | Freq: Once | INTRAVENOUS | Status: AC
  Administered 2020-05-01: 200 mg via INTRAVENOUS
  Filled 2020-05-01: qty 8

## 2020-05-01 MED ORDER — SODIUM CHLORIDE 0.9 % IV SOLN
500.0000 mg/m2 | Freq: Once | INTRAVENOUS | Status: AC
  Administered 2020-05-01: 800 mg via INTRAVENOUS
  Filled 2020-05-01: qty 20

## 2020-05-01 MED ORDER — PROCHLORPERAZINE MALEATE 10 MG PO TABS
10.0000 mg | ORAL_TABLET | Freq: Once | ORAL | Status: AC
  Administered 2020-05-01: 10 mg via ORAL

## 2020-05-01 MED ORDER — PROCHLORPERAZINE MALEATE 10 MG PO TABS
ORAL_TABLET | ORAL | Status: AC
  Filled 2020-05-01: qty 1

## 2020-05-01 MED ORDER — SODIUM CHLORIDE 0.9 % IV SOLN
Freq: Once | INTRAVENOUS | Status: AC
  Filled 2020-05-01: qty 250

## 2020-05-01 NOTE — Progress Notes (Signed)
[facility] [phone]   Fax:(336) [phone]  OFFICE PROGRESS NOTE  Patient, No Pcp Per No address on file  DIAGNOSIS: Metastatic non-small cell lung cancer initially diagnosed as stage IIIA (T3, N1/N2, M0) non-small cell lung cancer, adenocarcinoma presented with large right lower lobe lung mass with extension to the right hilum and subcarinal area diagnosed in July 2019.  She has brain metastasis in October 2020.  Biomarker Findings Tumor Mutational Burden - TMB-Intermediate (6 Muts/Mb) Microsatellite status - MS-Stable Genomic Findings For a complete list of the genes assayed, please refer to the Appendix. NRAS Q61R ARAF amplification STK11 G56W KRAS G13D MYCN amplification MCL1 amplification NKX2-1 amplification - equivocal? TP53 G245V 7 Disease relevant genes with no reportable alterations: EGFR, ALK, BRAF, MET, ERBB2, RET, ROS1   PRIOR THERAPY:  1) Course of concurrent chemoradiation with weekly carboplatin for AUC of 2 and paclitaxel 45 mg/M2.  Status post 7 cycles.  Last dose was giving 01/11/2018. 2) Consolidation treatment with immunotherapy with Imfinzi (Durvalumab) 10 mg/KG every 2 weeks.  First dose February 09, 2018.  Status post 19 cycles. 3) status post stereotactic body radiotherapy to the enlarging right supraclavicular lymphadenopathy under the care of Dr. Lisbeth Renshaw. 4) SRS to multiple brain metastasis under the care of Dr. Lisbeth Renshaw.  CURRENT THERAPY: Systemic chemotherapy with carboplatin for AUC of 5, Alimta 500 mg/M2 and Keytruda 200 mg IV every 3 weeks.  First dose 08/16/2019.  Status post 12 cycles.  Starting from cycle #5 the patient is on maintenance treatment with Alimta and Keytruda every 3 weeks.  INTERVAL HISTORY: MARLETA LAPIERRE 65 y.o. female returns to the clinic today for follow-up visit.  The patient is feeling fine today with no concerning complaints.  She denied having any current chest pain, shortness of breath, cough  or hemoptysis.  She denied having any fever or chills.  She has no nausea, vomiting, diarrhea or constipation.  She denied having any headache or visual changes.  She continues to tolerate her systemic chemotherapy fairly well.  The patient is here today for evaluation before starting cycle #13 of her maintenance treatment.  MEDICAL HISTORY: Past Medical History:  Diagnosis Date  . met lung ca dx'd 09/2017   neck LN and brain 2020  . Rheumatoid arthritis ([place])     ALLERGIES:  has No Known Allergies.  MEDICATIONS:  Current Outpatient Medications  Medication Sig Dispense Refill  . aspirin EC 81 MG tablet Take 81 mg by mouth daily.     . diclofenac sodium (VOLTAREN) 1 % GEL Apply 2 g topically 4 (four) times daily.    . Fluocinolone Acetonide Scalp 0.01 % OIL     . folic acid (FOLVITE) 1 MG tablet TAKE 1 TABLET(1 MG) BY MOUTH DAILY 30 tablet 4  . hydroxychloroquine (PLAQUENIL) 200 MG tablet Take 1 tablet by mouth twice daily Monday-Friday 120 tablet 0  . naproxen sodium (ALEVE) 220 MG tablet Take 220 mg by mouth daily as needed (PAIN).    Marland Kitchen omeprazole (PRILOSEC) 20 MG capsule Take 20 mg by mouth daily.    Marland Kitchen Phenylephrine-DM-GG-APAP (DELSYM COUGH/COLD DAYTIME PO) Take by mouth as needed.     . prochlorperazine (COMPAZINE) 10 MG tablet TAKE 1 TABLET(10 MG) BY MOUTH EVERY 6 HOURS AS NEEDED FOR NAUSEA OR VOMITING 30 tablet 0  . pseudoephedrine-guaifenesin (MUCINEX D) 60-600 MG 12 hr tablet Take 1 tablet by mouth as needed.     Marland Kitchen VITAMIN D PO Take by mouth.  No current facility-administered medications for this visit.    SURGICAL HISTORY:  Past Surgical History:  Procedure Laterality Date  . BACK SURGERY    . BRONCHIAL NEEDLE ASPIRATION BIOPSY  11/09/2017   Procedure: BRONCHIAL NEEDLE ASPIRATION BIOPSIES;  Surgeon: Marshell Garfinkel, MD;  Location: [facility];  Service: Cardiopulmonary;;  . ENDOBRONCHIAL ULTRASOUND Bilateral 11/09/2017   Procedure: ENDOBRONCHIAL ULTRASOUND;  Surgeon:  Marshell Garfinkel, MD;  Location: [facility];  Service: Cardiopulmonary;  Laterality: Bilateral;  . NASAL SINUS SURGERY    . NASAL SINUS SURGERY    . TUBAL LIGATION    . VIDEO BRONCHOSCOPY  11/09/2017   Procedure: VIDEO BRONCHOSCOPY;  Surgeon: Marshell Garfinkel, MD;  Location: [facility];  Service: Cardiopulmonary;;    REVIEW OF SYSTEMS:  A comprehensive review of systems was negative except for: Constitutional: positive for fatigue   PHYSICAL EXAMINATION: General appearance: alert, cooperative, fatigued and no distress Head: Normocephalic, without obvious abnormality, atraumatic Neck: no adenopathy, no JVD, supple, symmetrical, trachea midline and thyroid not enlarged, symmetric, no tenderness/mass/nodules Lymph nodes: Cervical, supraclavicular, and axillary nodes normal. Resp: clear to auscultation bilaterally Back: symmetric, no curvature. ROM normal. No CVA tenderness. Cardio: regular rate and rhythm, S1, S2 normal, no murmur, click, rub or gallop GI: soft, non-tender; bowel sounds normal; no masses,  no organomegaly Extremities: extremities normal, atraumatic, no cyanosis or edema  ECOG PERFORMANCE STATUS: 1 - Symptomatic but completely ambulatory  Blood pressure (!) 143/68, pulse (!) 102, temperature 97.7 F (36.5 C), temperature source Tympanic, resp. rate 17, height 5' (1.524 m), weight 118 lb 9.6 oz (53.8 kg), last menstrual period 04/28/2000, SpO2 100 %.  LABORATORY DATA: Lab Results  Component Value Date   WBC 8.6 05/01/2020   HGB 10.4 (L) 05/01/2020   HCT 32.4 (L) 05/01/2020   MCV 103.8 (H) 05/01/2020   PLT 409 (H) 05/01/2020      Chemistry      Component Value Date/Time   NA 141 04/09/2020 1013   K 3.4 (L) 04/09/2020 1013   CL 107 04/09/2020 1013   CO2 26 04/09/2020 1013   BUN 9 04/09/2020 1013   CREATININE 0.93 04/09/2020 1013   CREATININE 0.80 07/24/2017 1438      Component Value Date/Time   CALCIUM 10.3 04/09/2020 1013   ALKPHOS 61 04/09/2020 1013   AST  28 04/09/2020 1013   ALT 13 04/09/2020 1013   BILITOT 0.2 (L) 04/09/2020 1013       RADIOGRAPHIC STUDIES: No results found.  ASSESSMENT AND PLAN: This is a very pleasant 65 years old African-American female with metastatic non-small cell lung cancer initially diagnosed with a stage IIIA non-small cell lung cancer, adenocarcinoma.  She underwent a course of concurrent chemoradiation with weekly carboplatin and paclitaxel status post 7 cycles with partial response.   The patient tolerated this course of treatment well except for mild odynophagia and dysphagia. She completed on consolidation treatment with immunotherapy with Imfinzi (Durvalumab) status post 18 cycles. She also completed SBRT to the right supraclavicular lymphadenopathy. The patient had evidence for multiple brain metastasis in October 2020 and she underwent SRS treatment to this lesion under the care of Dr. Lisbeth Renshaw. The patient had evidence for disease progression and she started systemic chemotherapy with carboplatin, Alimta and Keytruda status post 12 cycles.  Starting from cycle #5 she is on maintenance treatment with Alimta and Keytruda every 3 weeks. The patient continues to tolerate her treatment fairly well with no concerning adverse effects. I recommended for her to proceed with cycle #  13 today as planned. I will see her back for follow-up visit in 3 weeks for evaluation with repeat CT scan of the chest, abdomen pelvis for restaging of her disease. For the chemotherapy-induced anemia, I advised the patient to start taking multivitamins and iron tablets at least once daily. For the mild hypokalemia, she was advised to increase her potassium rich diet. For the dry mouth she will continue with Biotene and salivasure as needed. The patient was advised to call immediately if she has any concerning symptoms in the interval. The patient voices understanding of current disease status and treatment options and is in agreement with  the current care plan. All questions were answered. The patient knows to call the clinic with any problems, questions or concerns. We can certainly see the patient much sooner if necessary.  Disclaimer: This note was dictated with voice recognition software. Similar sounding words can inadvertently be transcribed and may not be corrected upon review.

## 2020-05-01 NOTE — Patient Instructions (Signed)
[facility] Discharge Instructions for Patients Receiving Chemotherapy  Today you received the following chemotherapy agents: pembrolizumab and pemetrexed.  To help prevent nausea and vomiting after your treatment, we encourage you to take your nausea medication as directed.   If you develop nausea and vomiting that is not controlled by your nausea medication, call the clinic.   BELOW ARE SYMPTOMS THAT SHOULD BE REPORTED IMMEDIATELY:  *FEVER GREATER THAN 100.5 F  *CHILLS WITH OR WITHOUT FEVER  NAUSEA AND VOMITING THAT IS NOT CONTROLLED WITH YOUR NAUSEA MEDICATION  *UNUSUAL SHORTNESS OF BREATH  *UNUSUAL BRUISING OR BLEEDING  TENDERNESS IN MOUTH AND THROAT WITH OR WITHOUT PRESENCE OF ULCERS  *URINARY PROBLEMS  *BOWEL PROBLEMS  UNUSUAL RASH Items with * indicate a potential emergency and should be followed up as soon as possible.  Feel free to call the clinic should you have any questions or concerns. The clinic phone number is [phone].  Please show the [facility] at check-in to the Emergency Department and triage nurse.  [facility] Discharge Instructions for Patients Receiving Chemotherapy  Today you received the following chemotherapy agents: pembrolizumab and pemetrexed.  To help prevent nausea and vomiting after your treatment, we encourage you to take your nausea medication as directed.   If you develop nausea and vomiting that is not controlled by your nausea medication, call the clinic.   BELOW ARE SYMPTOMS THAT SHOULD BE REPORTED IMMEDIATELY:  *FEVER GREATER THAN 100.5 F  *CHILLS WITH OR WITHOUT FEVER  NAUSEA AND VOMITING THAT IS NOT CONTROLLED WITH YOUR NAUSEA MEDICATION  *UNUSUAL SHORTNESS OF BREATH  *UNUSUAL BRUISING OR BLEEDING  TENDERNESS IN MOUTH AND THROAT WITH OR WITHOUT PRESENCE OF ULCERS  *URINARY PROBLEMS  *BOWEL PROBLEMS  UNUSUAL RASH Items with * indicate a potential emergency and should be  followed up as soon as possible.  Feel free to call the clinic should you have any questions or concerns. The clinic phone number is [phone].  Please show the [facility] at check-in to the Emergency Department and triage nurse.

## 2020-05-03 ENCOUNTER — Telehealth: Payer: Self-pay | Admitting: Internal Medicine

## 2020-05-03 NOTE — Telephone Encounter (Signed)
Scheduled per 1/4 los. Pt will receive an updated appt calendar per next visit appt notes  

## 2020-05-10 ENCOUNTER — Other Ambulatory Visit: Payer: Self-pay

## 2020-05-10 NOTE — Progress Notes (Signed)
Office Visit Note  Patient: Susan Holder             Date of Birth: 1956/04/27           MRN: [id number]             PCP: [name], DO Referring: No ref. provider found Visit Date: 05/24/2020 Occupation: _0 @  Subjective:  Medication management   History of Present Illness: Susan Holder is a 65 y.o. female with rheumatoid arthritis.  She states she has been tolerating Plaquenil well.  She has had some stiffness in her  right knee joint off and on without any swelling.  None of the other joints are painful.  Activities of Daily Living:  Patient reports morning stiffness for 10 minutes.   Patient Denies nocturnal pain.  Difficulty dressing/grooming: Denies Difficulty climbing stairs: Denies Difficulty getting out of chair: Denies Difficulty using hands for taps, buttons, cutlery, and/or writing: Denies  Review of Systems  Constitutional: Positive for fatigue.  HENT: Positive for mouth dryness.   Eyes: Negative for dryness.  Respiratory: Negative for shortness of breath.   Cardiovascular: Negative for swelling in legs/feet.  Gastrointestinal: Negative for constipation.  Endocrine: Negative for excessive thirst.  Genitourinary: Negative for difficulty urinating.  Musculoskeletal: Positive for arthralgias, joint pain and morning stiffness.  Skin: Negative for rash.  Allergic/Immunologic: Negative for susceptible to infections.  Neurological: Negative for numbness.  Hematological: Negative for bruising/bleeding tendency.  Psychiatric/Behavioral: Negative for sleep disturbance.    PMFS History:  Patient Active Problem List   Diagnosis Date Noted  . Brain metastases ([place]) 03/29/2019  . 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 Acid reflux 09/21/2018  . Hoarseness 03/16/2018  . Lung cancer ([place]) 03/16/2018  . Encounter for antineoplastic immunotherapy 02/01/2018  . Primary malignant neoplasm of bronchus of right lower lobe ([place])  12/21/2017  . Adenocarcinoma of right lung, stage 3 ([place]) 11/17/2017  . Encounter for antineoplastic chemotherapy 11/17/2017  . Goals of care, counseling/discussion 11/17/2017  . Lung mass   . Left hand pain 03/06/2017  . Rheumatoid arthritis involving multiple sites with positive rheumatoid factor ([place]) 05/29/2016  . ANA positive 05/29/2016  . Vitamin D deficiency 05/29/2016  . High risk medication use 05/29/2016  . Trigger finger, left ring finger 05/29/2016  . Trigger finger, right ring finger 05/29/2016  . DDD (degenerative disc disease), cervical 05/29/2016  . Smoker 05/29/2016  . Other fatigue 05/29/2016  . Allergic rhinitis 08/22/2013    Past Medical History:  Diagnosis Date  . met lung ca dx'd 09/2017   neck LN and brain 2020  . Rheumatoid arthritis ([place])     Family History  Problem Relation Age of Onset  . Stroke Mother   . Alzheimer's disease Mother   . Heart disease Mother   . Emphysema Father   . Hypertension Brother   . Heart attack Maternal Aunt   . Heart failure Maternal Grandmother   . Hypertension Paternal Grandmother    Past Surgical History:  Procedure Laterality Date  . BRONCHIAL NEEDLE ASPIRATION BIOPSY  11/09/2017   Procedure: BRONCHIAL NEEDLE ASPIRATION BIOPSIES;  Surgeon: Marshell Garfinkel, MD;  Location: [facility];  Service: Cardiopulmonary;;  . ENDOBRONCHIAL ULTRASOUND Bilateral 11/09/2017   Procedure: ENDOBRONCHIAL ULTRASOUND;  Surgeon: Marshell Garfinkel, MD;  Location: [facility];  Service: Cardiopulmonary;  Laterality: Bilateral;  . NASAL SINUS SURGERY    . NASAL SINUS SURGERY    . TUBAL LIGATION    . VIDEO BRONCHOSCOPY  11/09/2017   Procedure: VIDEO BRONCHOSCOPY;  Surgeon: Marshell Garfinkel, MD;  Location: [facility];  Service: Cardiopulmonary;;   Social History   Social History Narrative  . Not on file   Immunization History  Administered Date(s) Administered  . Influenza, High Dose Seasonal PF 01/22/2019  . Influenza,inj,Quad PF,6+  Mos 03/08/2018  . Influenza-Unspecified 02/08/2020  . PFIZER(Purple Top)SARS-COV-2 Vaccination 06/02/2019, 06/23/2019, 12/24/2019     Objective: Vital Signs: BP 118/69 (BP Location: Left Arm, Patient Position: Sitting, Cuff Size: Normal)   Pulse (!) 108   Resp 15   Ht 5' (1.524 m)   Wt 120 lb 12.8 oz (54.8 kg)   LMP 04/28/2000   BMI 23.59 kg/m    Physical Exam Vitals and nursing note reviewed.  Constitutional:      Appearance: She is well-developed and well-nourished.  HENT:     Head: Normocephalic and atraumatic.  Eyes:     Extraocular Movements: EOM normal.     Conjunctiva/sclera: Conjunctivae normal.  Cardiovascular:     Rate and Rhythm: Normal rate and regular rhythm.     Pulses: Intact distal pulses.     Heart sounds: Normal heart sounds.  Pulmonary:     Effort: Pulmonary effort is normal.     Breath sounds: Normal breath sounds.  Abdominal:     General: Bowel sounds are normal.     Palpations: Abdomen is soft.  Musculoskeletal:     Cervical back: Normal range of motion.  Lymphadenopathy:     Cervical: No cervical adenopathy.  Skin:    General: Skin is warm and dry.     Capillary Refill: Capillary refill takes less than 2 seconds.  Neurological:     Mental Status: She is alert and oriented to person, place, and time.  Psychiatric:        Mood and Affect: Mood and affect normal.        Behavior: Behavior normal.      Musculoskeletal Exam: C-spine was in good range of motion.  Shoulder joints, elbow joints, wrist joints with good range of motion.  She had no synovitis over MCPs.  PIP and DIP prominence was noted.  Hip joints, knee joints, ankles with good range of motion with no synovitis.  There was no tenderness across MTPs. CDAI Exam: CDAI Score: 0  Patient Global: 0 mm; Provider Global: 0 mm Swollen: 0 ; Tender: 0  Joint Exam 05/24/2020   No joint exam has been documented for this visit   There is currently no information documented on the homunculus.  Go to the Rheumatology activity and complete the homunculus joint exam.  Investigation: No additional findings.  Imaging: CT Chest W Contrast  Result Date: 05/17/2020 CLINICAL DATA:  Non-small cell lung cancer restaging EXAM: CT CHEST, ABDOMEN, AND PELVIS WITH CONTRAST TECHNIQUE: Multidetector CT imaging of the chest, abdomen and pelvis was performed following the standard protocol during bolus administration of intravenous contrast. CONTRAST:  174m OMNIPAQUE IOHEXOL 300 MG/ML SOLN, additional oral enteric contrast COMPARISON:  03/02/2020 FINDINGS: CT CHEST FINDINGS Cardiovascular: Aortic atherosclerosis. Normal heart size. Right coronary artery calcifications. No pericardial effusion. Mediastinum/Nodes: Unchanged post treatment soft tissue thickening about the right hilum. No discretely enlarged lymph nodes. Thyroid gland, trachea, and esophagus demonstrate no significant findings. Lungs/Pleura: Moderate centrilobular emphysema. Unchanged post treatment appearance of the right lung with dense perihilar and infrahilar post treatment consolidation and fibrosis. Unchanged 5 mm pulmonary nodule of the left lower lobe (series 4, image 65). No pleural effusion or pneumothorax. Musculoskeletal: No  chest wall mass or suspicious bone lesions identified. CT ABDOMEN PELVIS FINDINGS Hepatobiliary: No solid liver abnormality is seen. No gallstones, gallbladder wall thickening, or biliary dilatation. Pancreas: Unremarkable. No pancreatic ductal dilatation or surrounding inflammatory changes. Spleen: Normal in size without significant abnormality. Adrenals/Urinary Tract: Adrenal glands are unremarkable. Kidneys are normal, without renal calculi, solid lesion, or hydronephrosis. Bladder is unremarkable. Stomach/Bowel: Stomach is within normal limits. Appendix appears normal. No evidence of bowel wall thickening, distention, or inflammatory changes. Sigmoid diverticula. Moderate burden of stool throughout the colon and  rectum. Vascular/Lymphatic: No significant vascular findings are present. No enlarged abdominal or pelvic lymph nodes. Reproductive: No mass or other abnormality. Bilateral tubal ligation clips. Other: No abdominal wall hernia or abnormality. No abdominopelvic ascites. Musculoskeletal: No acute or significant osseous findings. IMPRESSION: 1. Unchanged post treatment appearance of the right lung with dense perihilar and infrahilar post treatment consolidation and fibrosis as well as post treatment appearance of soft tissue about the right hilum. No evidence of malignant recurrence. 2. Unchanged 5 mm pulmonary nodule of the left lower lobe. Attention on follow-up. 3. No evidence of metastatic disease within the abdomen or pelvis. 4. Emphysema. 5. Coronary artery disease. Aortic Atherosclerosis (ICD10-I70.0) and Emphysema (ICD10-J43.9). Electronically Signed   By: Eddie Candle M.D.   On: 05/17/2020 07:41   CT Abdomen Pelvis W Contrast  Result Date: 05/17/2020 CLINICAL DATA:  Non-small cell lung cancer restaging EXAM: CT CHEST, ABDOMEN, AND PELVIS WITH CONTRAST TECHNIQUE: Multidetector CT imaging of the chest, abdomen and pelvis was performed following the standard protocol during bolus administration of intravenous contrast. CONTRAST:  126m OMNIPAQUE IOHEXOL 300 MG/ML SOLN, additional oral enteric contrast COMPARISON:  03/02/2020 FINDINGS: CT CHEST FINDINGS Cardiovascular: Aortic atherosclerosis. Normal heart size. Right coronary artery calcifications. No pericardial effusion. Mediastinum/Nodes: Unchanged post treatment soft tissue thickening about the right hilum. No discretely enlarged lymph nodes. Thyroid gland, trachea, and esophagus demonstrate no significant findings. Lungs/Pleura: Moderate centrilobular emphysema. Unchanged post treatment appearance of the right lung with dense perihilar and infrahilar post treatment consolidation and fibrosis. Unchanged 5 mm pulmonary nodule of the left lower lobe (series  4, image 65). No pleural effusion or pneumothorax. Musculoskeletal: No chest wall mass or suspicious bone lesions identified. CT ABDOMEN PELVIS FINDINGS Hepatobiliary: No solid liver abnormality is seen. No gallstones, gallbladder wall thickening, or biliary dilatation. Pancreas: Unremarkable. No pancreatic ductal dilatation or surrounding inflammatory changes. Spleen: Normal in size without significant abnormality. Adrenals/Urinary Tract: Adrenal glands are unremarkable. Kidneys are normal, without renal calculi, solid lesion, or hydronephrosis. Bladder is unremarkable. Stomach/Bowel: Stomach is within normal limits. Appendix appears normal. No evidence of bowel wall thickening, distention, or inflammatory changes. Sigmoid diverticula. Moderate burden of stool throughout the colon and rectum. Vascular/Lymphatic: No significant vascular findings are present. No enlarged abdominal or pelvic lymph nodes. Reproductive: No mass or other abnormality. Bilateral tubal ligation clips. Other: No abdominal wall hernia or abnormality. No abdominopelvic ascites. Musculoskeletal: No acute or significant osseous findings. IMPRESSION: 1. Unchanged post treatment appearance of the right lung with dense perihilar and infrahilar post treatment consolidation and fibrosis as well as post treatment appearance of soft tissue about the right hilum. No evidence of malignant recurrence. 2. Unchanged 5 mm pulmonary nodule of the left lower lobe. Attention on follow-up. 3. No evidence of metastatic disease within the abdomen or pelvis. 4. Emphysema. 5. Coronary artery disease. Aortic Atherosclerosis (ICD10-I70.0) and Emphysema (ICD10-J43.9). Electronically Signed   By: AEddie CandleM.D.   On: 05/17/2020 07:41  Recent Labs: Lab Results  Component Value Date   WBC 9.5 05/21/2020   HGB 10.5 (L) 05/21/2020   PLT 529 (H) 05/21/2020   NA 140 05/21/2020   K 3.6 05/21/2020   CL 106 05/21/2020   CO2 22 05/21/2020   GLUCOSE 87 05/21/2020    BUN 13 05/21/2020   CREATININE 1.05 (H) 05/21/2020   BILITOT 0.2 (L) 05/21/2020   ALKPHOS 72 05/21/2020   AST 39 05/21/2020   ALT 19 05/21/2020   PROT 8.4 (H) 05/21/2020   ALBUMIN 3.4 (L) 05/21/2020   CALCIUM 10.5 (H) 05/21/2020   GFRAA >60 01/17/2020    Speciality Comments: PLQ eye exam: 01/19/2020 WNL @ [facility]. Follow up in 1 year.  Procedures:  No procedures performed Allergies: Patient has no known allergies.   Assessment / Plan:     Visit Diagnoses: Rheumatoid arthritis involving multiple sites with positive rheumatoid factor (HCC) - +RF, +anti-CCP, +ANA with nodulosis: Patient had no synovitis on examination.  She has been tolerating Plaquenil well.  High risk medication use -  Plaquenil 200 mg 1 tablet twice daily Monday through Friday only. PLQ eye exam: 01/19/2020.  I reviewed her recent labs which are stable.  She gets labs through the cancer center.  DDD (degenerative disc disease), cervical-currently not having much discomfort she had good range of motion.  Acquired trigger finger of both middle fingers-she has had intermittent issues with currently not symptomatic.  Vitamin D deficiency-she takes vitamin D supplement.  Primary malignant neoplasm of bronchus of right lower lobe (HCC)-followed by oncology.  Adenocarcinoma of right lung, stage 3 ([place])  Educated about COVID-19 virus infection-patient is fully vaccinated and had total of 3 doses.  I advised a booster 6 months later.  Use of mask, social distancing and hand hygiene was discussed.  Orders: No orders of the defined types were placed in this encounter.  No orders of the defined types were placed in this encounter.    Follow-Up Instructions: Return in about 5 months (around 10/22/2020) for Rheumatoid arthritis.   Bo Merino, MD  Note - This record has been created using Editor, commissioning.  Chart creation errors have been sought, but may not always  have been located. Such creation  errors do not reflect on  the standard of medical care.

## 2020-05-11 ENCOUNTER — Encounter: Payer: Self-pay | Admitting: Family Medicine

## 2020-05-11 ENCOUNTER — Ambulatory Visit: Payer: Medicare PPO | Admitting: Family Medicine

## 2020-05-11 VITALS — BP 106/60 | HR 120 | Temp 98.5°F | Ht 59.75 in | Wt 118.8 lb

## 2020-05-11 DIAGNOSIS — L819 Disorder of pigmentation, unspecified: Secondary | ICD-10-CM

## 2020-05-11 DIAGNOSIS — L659 Nonscarring hair loss, unspecified: Secondary | ICD-10-CM

## 2020-05-11 DIAGNOSIS — L853 Xerosis cutis: Secondary | ICD-10-CM | POA: Diagnosis not present

## 2020-05-11 DIAGNOSIS — Z7689 Persons encountering health services in other specified circumstances: Secondary | ICD-10-CM | POA: Diagnosis not present

## 2020-05-11 NOTE — Progress Notes (Addendum)
Susan Holder is a 65 y.o. female  Chief Complaint  Patient presents with  . Establish Care    NP- establishing care.  C/o her scalp is turning white and is worried about Graves disease.      HPI: Susan Holder is a 65 y.o. female here to establish care with our office. She has not had a PCP in the past few years. She had a 9yo granddaughter whom she has raised since birth.  She has a PMHx significant for RA and metastatic adenocarcinoma of the lung to brain (initial dx in 10/2017, mets dx in 01/2019). She follows with oncology Dr. Julien Nordmann and rheum Dr. Estanislado Pandy. She is current receiving maintenance chemotherapy q3wks. She did chemoradiation and immunotherapy previously.   Her concern today include: 1. Scalp is losing pigmentation that began in the past 6 mo ago. RN at cancer center mentioned it could be d/t thyroid issue.  She notes a h/o dry scalp and uses fluocinolone oil by dermatology Lennie Odor, PA with [facility]. Derm did see her scalp and did not give dx but did not think vitiligo. Dr. Ida Rogue (rad onc) PA Maternal aunt with "thyroid issues".  Component     Latest Ref Rng & Units 11/15/2019 12/06/2019 12/27/2019 01/17/2020  TSH     0.308 - 3.960 uIU/mL 2.835 1.343 2.083 2.096   Component     Latest Ref Rng & Units 02/06/2020 02/28/2020 03/19/2020 04/09/2020  TSH     0.308 - 3.960 uIU/mL 1.144 1.934 1.495 1.262    Past Medical History:  Diagnosis Date  . met lung ca dx'd 09/2017   neck LN and brain 2020  . Rheumatoid arthritis [facility])     Past Surgical History:  Procedure Laterality Date  . BACK SURGERY    . BRONCHIAL NEEDLE ASPIRATION BIOPSY  11/09/2017   Procedure: BRONCHIAL NEEDLE ASPIRATION BIOPSIES;  Surgeon: Marshell Garfinkel, MD;  Location: [facility];  Service: Cardiopulmonary;;  . ENDOBRONCHIAL ULTRASOUND Bilateral 11/09/2017   Procedure: ENDOBRONCHIAL ULTRASOUND;  Surgeon: Marshell Garfinkel, MD;  Location: [facility];  Service: Cardiopulmonary;   Laterality: Bilateral;  . NASAL SINUS SURGERY    . NASAL SINUS SURGERY    . TUBAL LIGATION    . VIDEO BRONCHOSCOPY  11/09/2017   Procedure: VIDEO BRONCHOSCOPY;  Surgeon: Marshell Garfinkel, MD;  Location: [facility];  Service: Cardiopulmonary;;    Social History   Socioeconomic History  . Marital status: Divorced    Spouse name: Not on file  . Number of children: Not on file  . Years of education: Not on file  . Highest education level: Not on file  Occupational History  . Not on file  Tobacco Use  . Smoking status: Former Smoker    Packs/day: 0.50    Years: 45.00    Pack years: 22.50    Types: Cigarettes    Quit date: 09/29/2017    Years since quitting: 2.6  . Smokeless tobacco: Never Used  Vaping Use  . Vaping Use: Never used  Substance and Sexual Activity  . Alcohol use: Not Currently  . Drug use: No  . Sexual activity: Not Currently  Other Topics Concern  . Not on file  Social History Narrative  . Not on file   Social Determinants of Health   Financial Resource Strain: Not on file  Food Insecurity: Not on file  Transportation Needs: Not on file  Physical Activity: Not on file  Stress: Not on file  Social Connections: Not on file  Intimate Partner Violence: Not on file    Family History  Problem Relation Age of Onset  . Stroke Mother   . Alzheimer's disease Mother   . Heart disease Mother   . Emphysema Father   . Hypertension Brother   . Heart attack Maternal Aunt   . Heart failure Maternal Grandmother   . Hypertension Paternal Grandmother      Immunization History  Administered Date(s) Administered  . Influenza, High Dose Seasonal PF 01/22/2019  . Influenza,inj,Quad PF,6+ Mos 03/08/2018  . Influenza-Unspecified 02/08/2020  . PFIZER SARS-COV-2 Vaccination 06/02/2019, 06/23/2019, 12/24/2019    Outpatient Encounter Medications as of 05/11/2020  Medication Sig  . diclofenac sodium (VOLTAREN) 1 % GEL Apply 2 g topically 4 (four) times daily.  .  Fluocinolone Acetonide Scalp 0.01 % OIL   . folic acid (FOLVITE) 1 MG tablet TAKE 1 TABLET(1 MG) BY MOUTH DAILY  . hydroxychloroquine (PLAQUENIL) 200 MG tablet Take 1 tablet by mouth twice daily Monday-Friday  . naproxen sodium (ALEVE) 220 MG tablet Take 220 mg by mouth daily as needed (PAIN).  Marland Kitchen omeprazole (PRILOSEC) 20 MG capsule Take 20 mg by mouth daily.  Marland Kitchen Phenylephrine-DM-GG-APAP (DELSYM COUGH/COLD DAYTIME PO) Take by mouth as needed.   . prochlorperazine (COMPAZINE) 10 MG tablet TAKE 1 TABLET(10 MG) BY MOUTH EVERY 6 HOURS AS NEEDED FOR NAUSEA OR VOMITING  . pseudoephedrine-guaifenesin (MUCINEX D) 60-600 MG 12 hr tablet Take 1 tablet by mouth as needed.   Marland Kitchen VITAMIN D PO Take by mouth.   No facility-administered encounter medications on file as of 05/11/2020.     ROS: Pertinent positives and negatives noted in HPI. Remainder of ROS non-contributory    No Known Allergies  BP 106/60   Pulse (!) 120   Temp 98.5 F (36.9 C) (Temporal)   Ht 4' 11.75" (1.518 m)   Wt 118 lb 12.8 oz (53.9 kg)   LMP 04/28/2000   SpO2 97%   BMI 23.40 kg/m   Physical Exam Constitutional:      General: She is not in acute distress.    Appearance: Normal appearance.  Neck:     Thyroid: No thyroid mass, thyromegaly or thyroid tenderness.  Musculoskeletal:     Cervical back: Neck supple. No tenderness.  Lymphadenopathy:     Cervical: No cervical adenopathy.  Skin:      Neurological:     Mental Status: She is alert and oriented to person, place, and time.  Psychiatric:        Mood and Affect: Mood normal.        Behavior: Behavior normal.      A/P:  1. Encounter to establish care with new doctor  2. Hair thinning 3. Dry skin 4. Skin hypopigmentation - oncology does not feel hypopigmentation it is med related; hair thinning is d/t chemo and pt has had dry skin and scalp x years - she has f/u with derm scheduled - she wants to r/o thyroid dysfunction; normal TSH - T4, free; Future -  T3; Future - Thyroglobulin antibody; Future - Thyroid stimulating immunoglobulin; Future - Thyroid peroxidase antibody; Future  This visit occurred during the SARS-CoV-2 public health emergency.  Safety protocols were in place, including screening questions prior to the visit, additional usage of staff PPE, and extensive cleaning of exam room while observing appropriate contact time as indicated for disinfecting solutions.

## 2020-05-12 ENCOUNTER — Other Ambulatory Visit: Payer: Self-pay | Admitting: Physician Assistant

## 2020-05-12 DIAGNOSIS — M0579 Rheumatoid arthritis with rheumatoid factor of multiple sites without organ or systems involvement: Secondary | ICD-10-CM

## 2020-05-13 DIAGNOSIS — C3490 Malignant neoplasm of unspecified part of unspecified bronchus or lung: Secondary | ICD-10-CM | POA: Diagnosis not present

## 2020-05-14 NOTE — Telephone Encounter (Signed)
Last Visit: 12/22/2019 Next Visit: 05/24/2020 Labs: 05/01/2020, RBC 3.12, Hemoglobin 10.4, HCT 32.4, MCV 103.8, Platelet Count 409, Calcium 10.6, Albumin 3.4,  Eye exam: 01/19/2020 WNL  Current Dose per office note 12/22/2019, Plaquenil 200 mg 1 tablet twice daily Monday through Friday  PQ:[id number] arthritis involving multiple sites with positive rheumatoid factor   Okay to refill Plaquenil?

## 2020-05-16 ENCOUNTER — Ambulatory Visit (HOSPITAL_BASED_OUTPATIENT_CLINIC_OR_DEPARTMENT_OTHER)
Admission: RE | Admit: 2020-05-16 | Discharge: 2020-05-16 | Disposition: A | Discharge status: Home / Self Care | Payer: Medicare PPO | Source: Ambulatory Visit | Attending: Internal Medicine | Admitting: Internal Medicine

## 2020-05-16 ENCOUNTER — Other Ambulatory Visit: Payer: Self-pay

## 2020-05-16 ENCOUNTER — Encounter (HOSPITAL_BASED_OUTPATIENT_CLINIC_OR_DEPARTMENT_OTHER): Payer: Self-pay

## 2020-05-16 DIAGNOSIS — C3490 Malignant neoplasm of unspecified part of unspecified bronchus or lung: Secondary | ICD-10-CM | POA: Diagnosis not present

## 2020-05-16 MED ORDER — IOHEXOL 300 MG/ML  SOLN
100.0000 mL | Freq: Once | INTRAMUSCULAR | Status: AC | PRN
  Administered 2020-05-16: 100 mL via INTRAVENOUS

## 2020-05-16 NOTE — Progress Notes (Signed)
[facility] OFFICE PROGRESS NOTE  [name], DO [place] [address]  DIAGNOSIS: Metastatic non-small cell lung cancer initially diagnosed as stage IIIA (T3,N1/N2, M0)non-small cell lung cancer, adenocarcinoma presented with large right lower lobe lung mass with extension to the right hilum and subcarinal area diagnosed in July 2019. She has brain metastasis in October 2020.  Biomarker Findings Tumor Mutational Burden - TMB-Intermediate (6 Muts/Mb) Microsatellite status - MS-Stable Genomic Findings For a complete list of the genes assayed, please refer to the Appendix. NRAS Q61R ARAF amplification STK11 G56W KRAS G13D MYCN amplification MCL1 amplification NKX2-1 amplification - equivocal? TP53 G245V 7 Disease relevant genes with no reportable alterations: EGFR, ALK, BRAF, MET, ERBB2, RET, ROS1   PRIOR THERAPY: 1) Course of concurrent chemoradiation with weekly carboplatin for AUC of 2 and paclitaxel 45 mg/M2. Status post 7 cycles. Last dose was giving 01/11/2018. 2) Consolidation treatment with immunotherapy with Imfinzi (Durvalumab) 10 mg/KG every 2 weeks. First dose February 09, 2018. Status post 19 cycles. 3) status post stereotactic body radiotherapy to the enlarging right supraclavicular lymphadenopathy under the care of Dr. Lisbeth Renshaw. 4) SRS to multiple brain metastasis under the care of Dr. Lisbeth Renshaw.  CURRENT THERAPY: Systemic chemotherapy with carboplatin for AUC of 5, Alimta 500 mg/M2 and Keytruda 200 mg IV every 3 weeks. First dose 08/16/2019. Status post 13cycles. Starting from cycle #5 the patient is on maintenance treatment with Alimta and Keytruda every 3 weeks.  INTERVAL HISTORY: Susan Holder 65 y.o. female returns to the clinic for a follow up visit. The patient is feeling well today without any concerning complaints except for fatigue. She also reports dry mouth for which she uses biotene and drinks plenty of  water. The patient continues to tolerate treatment withAlimta and [place]ud[place] without any adverse sideeffectsexcept for fatigue. Denies any fever, chills, or night sweats. She had been losing weight but her weight is fairly stable compared to her last visit. Denies any chest pain, shortness of breath,  or hemoptysis. She reports a baseline dry cough which has been the same for 3 years. Denies any nausea, vomiting, diarrhea, or constipation. Denies any headache or visual changes. She follows with radiation oncology for her history of brain metastases. Denies any rashes or skin changes. She recently had a restaging CT scan performed. The patient is here today for evaluation and to review her scan prior to starting cycle # 14   MEDICAL HISTORY: Past Medical History:  Diagnosis Date  . met lung ca dx'd 09/2017   neck LN and brain 2020  . Rheumatoid arthritis ([place])     ALLERGIES:  has No Known Allergies.  MEDICATIONS:  Current Outpatient Medications  Medication Sig Dispense Refill  . diclofenac sodium (VOLTAREN) 1 % GEL Apply 2 g topically 4 (four) times daily.    . Fluocinolone Acetonide Scalp 0.01 % OIL     . folic acid (FOLVITE) 1 MG tablet TAKE 1 TABLET(1 MG) BY MOUTH DAILY 30 tablet 4  . hydroxychloroquine (PLAQUENIL) 200 MG tablet TAKE 1 TABLET BY MOUTH TWICE DAILY MONDAY-FRIDAY 120 tablet 0  . naproxen sodium (ALEVE) 220 MG tablet Take 220 mg by mouth daily as needed (PAIN).    Marland Kitchen omeprazole (PRILOSEC) 20 MG capsule Take 20 mg by mouth daily.    Marland Kitchen Phenylephrine-DM-GG-APAP (DELSYM COUGH/COLD DAYTIME PO) Take by mouth as needed.     . prochlorperazine (COMPAZINE) 10 MG tablet TAKE 1 TABLET(10 MG) BY MOUTH EVERY 6 HOURS AS NEEDED  FOR NAUSEA OR VOMITING 30 tablet 0  . pseudoephedrine-guaifenesin (MUCINEX D) 60-600 MG 12 hr tablet Take 1 tablet by mouth as needed.     Marland Kitchen VITAMIN D PO Take by mouth.     No current facility-administered medications for this visit.    SURGICAL HISTORY:   Past Surgical History:  Procedure Laterality Date  . BACK SURGERY    . BRONCHIAL NEEDLE ASPIRATION BIOPSY  11/09/2017   Procedure: BRONCHIAL NEEDLE ASPIRATION BIOPSIES;  Surgeon: Marshell Garfinkel, MD;  Location: [facility];  Service: Cardiopulmonary;;  . ENDOBRONCHIAL ULTRASOUND Bilateral 11/09/2017   Procedure: ENDOBRONCHIAL ULTRASOUND;  Surgeon: Marshell Garfinkel, MD;  Location: [facility];  Service: Cardiopulmonary;  Laterality: Bilateral;  . NASAL SINUS SURGERY    . NASAL SINUS SURGERY    . TUBAL LIGATION    . VIDEO BRONCHOSCOPY  11/09/2017   Procedure: VIDEO BRONCHOSCOPY;  Surgeon: Marshell Garfinkel, MD;  Location: [facility];  Service: Cardiopulmonary;;    REVIEW OF SYSTEMS:   Review of Systems  Constitutional: Positive for fatigue. Negative for appetite change, chills, fever and unexpected weight change.  HENT: Positive for dry mouth. Negative for mouth sores, nosebleeds, sore throat and trouble swallowing.   Eyes: Negative for eye problems and icterus.  Respiratory: Positive for dry mouth. Negative for hemoptysis, shortness of breath and wheezing.   Cardiovascular: Negative for chest pain and leg swelling.  Gastrointestinal: Negative for abdominal pain, constipation, diarrhea, nausea and vomiting.  Genitourinary: Negative for bladder incontinence, difficulty urinating, dysuria, frequency and hematuria.   Musculoskeletal: Negative for back pain, gait problem, neck pain and neck stiffness.  Skin: Negative for itching and rash.  Neurological: Negative for dizziness, extremity weakness, gait problem, headaches, light-headedness and seizures.  Hematological: Negative for adenopathy. Does not bruise/bleed easily.  Psychiatric/Behavioral: Negative for confusion, depression and sleep disturbance. The patient is not nervous/anxious.       PHYSICAL EXAMINATION:  Blood pressure (!) 142/65, pulse (!) 118, temperature (!) 97 F (36.1 C), temperature source Tympanic, resp. rate 18, height  4' 11"  (1.499 m), weight 117 lb 12.8 oz (53.4 kg), last menstrual period 04/28/2000, SpO2 98 %.  ECOG PERFORMANCE STATUS: 1 - Symptomatic but completely ambulatory  Physical Exam  Constitutional: Oriented to person, place, and time and well-developed, well-nourished, and in no distress. HENT:  Head: Normocephalic and atraumatic.  Mouth/Throat: Oropharynx is clear and moist. No oropharyngeal exudate.  Eyes: Conjunctivae are normal. Right eye exhibits no discharge. Left eye exhibits no discharge. No scleral icterus.  Neck: Normal range of motion. Neck supple.  Cardiovascular: Normal rate, regular rhythm, normal heart sounds and intact distal pulses.   Pulmonary/Chest: Effort normal and breath sounds normal. No respiratory distress. No wheezes. No rales.  Abdominal: Soft. Bowel sounds are normal. Exhibits no distension and no mass. There is no tenderness.  Musculoskeletal: Normal range of motion. Exhibits no edema.  Lymphadenopathy:    No cervical adenopathy.  Neurological: Alert and oriented to person, place, and time. Exhibits normal muscle tone. Gait normal. Coordination normal.  Skin: Skin is warm and dry. No rash noted. Not diaphoretic. No erythema. No pallor.  Psychiatric: Mood, memory and judgment normal.  Vitals reviewed.  LABORATORY DATA: Lab Results  Component Value Date   WBC 9.5 05/21/2020   HGB 10.5 (L) 05/21/2020   HCT 33.3 (L) 05/21/2020   MCV 105.4 (H) 05/21/2020   PLT 529 (H) 05/21/2020      Chemistry      Component Value Date/Time   NA 140 05/21/2020 0848  K 3.6 05/21/2020 0848   CL 106 05/21/2020 0848   CO2 22 05/21/2020 0848   BUN 13 05/21/2020 0848   CREATININE 1.05 (H) 05/21/2020 0848   CREATININE 0.80 07/24/2017 1438      Component Value Date/Time   CALCIUM 10.5 (H) 05/21/2020 0848   ALKPHOS 72 05/21/2020 0848   AST 39 05/21/2020 0848   ALT 19 05/21/2020 0848   BILITOT 0.2 (L) 05/21/2020 0848       RADIOGRAPHIC STUDIES:  CT Chest W  Contrast  Result Date: 05/17/2020 CLINICAL DATA:  Non-small cell lung cancer restaging EXAM: CT CHEST, ABDOMEN, AND PELVIS WITH CONTRAST TECHNIQUE: Multidetector CT imaging of the chest, abdomen and pelvis was performed following the standard protocol during bolus administration of intravenous contrast. CONTRAST:  14m OMNIPAQUE IOHEXOL 300 MG/ML SOLN, additional oral enteric contrast COMPARISON:  03/02/2020 FINDINGS: CT CHEST FINDINGS Cardiovascular: Aortic atherosclerosis. Normal heart size. Right coronary artery calcifications. No pericardial effusion. Mediastinum/Nodes: Unchanged post treatment soft tissue thickening about the right hilum. No discretely enlarged lymph nodes. Thyroid gland, trachea, and esophagus demonstrate no significant findings. Lungs/Pleura: Moderate centrilobular emphysema. Unchanged post treatment appearance of the right lung with dense perihilar and infrahilar post treatment consolidation and fibrosis. Unchanged 5 mm pulmonary nodule of the left lower lobe (series 4, image 65). No pleural effusion or pneumothorax. Musculoskeletal: No chest wall mass or suspicious bone lesions identified. CT ABDOMEN PELVIS FINDINGS Hepatobiliary: No solid liver abnormality is seen. No gallstones, gallbladder wall thickening, or biliary dilatation. Pancreas: Unremarkable. No pancreatic ductal dilatation or surrounding inflammatory changes. Spleen: Normal in size without significant abnormality. Adrenals/Urinary Tract: Adrenal glands are unremarkable. Kidneys are normal, without renal calculi, solid lesion, or hydronephrosis. Bladder is unremarkable. Stomach/Bowel: Stomach is within normal limits. Appendix appears normal. No evidence of bowel wall thickening, distention, or inflammatory changes. Sigmoid diverticula. Moderate burden of stool throughout the colon and rectum. Vascular/Lymphatic: No significant vascular findings are present. No enlarged abdominal or pelvic lymph nodes. Reproductive: No mass  or other abnormality. Bilateral tubal ligation clips. Other: No abdominal wall hernia or abnormality. No abdominopelvic ascites. Musculoskeletal: No acute or significant osseous findings. IMPRESSION: 1. Unchanged post treatment appearance of the right lung with dense perihilar and infrahilar post treatment consolidation and fibrosis as well as post treatment appearance of soft tissue about the right hilum. No evidence of malignant recurrence. 2. Unchanged 5 mm pulmonary nodule of the left lower lobe. Attention on follow-up. 3. No evidence of metastatic disease within the abdomen or pelvis. 4. Emphysema. 5. Coronary artery disease. Aortic Atherosclerosis (ICD10-I70.0) and Emphysema (ICD10-J43.9). Electronically Signed   By: AEddie CandleM.D.   On: 05/17/2020 07:41   CT Abdomen Pelvis W Contrast  Result Date: 05/17/2020 CLINICAL DATA:  Non-small cell lung cancer restaging EXAM: CT CHEST, ABDOMEN, AND PELVIS WITH CONTRAST TECHNIQUE: Multidetector CT imaging of the chest, abdomen and pelvis was performed following the standard protocol during bolus administration of intravenous contrast. CONTRAST:  108mOMNIPAQUE IOHEXOL 300 MG/ML SOLN, additional oral enteric contrast COMPARISON:  03/02/2020 FINDINGS: CT CHEST FINDINGS Cardiovascular: Aortic atherosclerosis. Normal heart size. Right coronary artery calcifications. No pericardial effusion. Mediastinum/Nodes: Unchanged post treatment soft tissue thickening about the right hilum. No discretely enlarged lymph nodes. Thyroid gland, trachea, and esophagus demonstrate no significant findings. Lungs/Pleura: Moderate centrilobular emphysema. Unchanged post treatment appearance of the right lung with dense perihilar and infrahilar post treatment consolidation and fibrosis. Unchanged 5 mm pulmonary nodule of the left lower lobe (series 4, image 65). No pleural effusion  or pneumothorax. Musculoskeletal: No chest wall mass or suspicious bone lesions identified. CT ABDOMEN  PELVIS FINDINGS Hepatobiliary: No solid liver abnormality is seen. No gallstones, gallbladder wall thickening, or biliary dilatation. Pancreas: Unremarkable. No pancreatic ductal dilatation or surrounding inflammatory changes. Spleen: Normal in size without significant abnormality. Adrenals/Urinary Tract: Adrenal glands are unremarkable. Kidneys are normal, without renal calculi, solid lesion, or hydronephrosis. Bladder is unremarkable. Stomach/Bowel: Stomach is within normal limits. Appendix appears normal. No evidence of bowel wall thickening, distention, or inflammatory changes. Sigmoid diverticula. Moderate burden of stool throughout the colon and rectum. Vascular/Lymphatic: No significant vascular findings are present. No enlarged abdominal or pelvic lymph nodes. Reproductive: No mass or other abnormality. Bilateral tubal ligation clips. Other: No abdominal wall hernia or abnormality. No abdominopelvic ascites. Musculoskeletal: No acute or significant osseous findings. IMPRESSION: 1. Unchanged post treatment appearance of the right lung with dense perihilar and infrahilar post treatment consolidation and fibrosis as well as post treatment appearance of soft tissue about the right hilum. No evidence of malignant recurrence. 2. Unchanged 5 mm pulmonary nodule of the left lower lobe. Attention on follow-up. 3. No evidence of metastatic disease within the abdomen or pelvis. 4. Emphysema. 5. Coronary artery disease. Aortic Atherosclerosis (ICD10-I70.0) and Emphysema (ICD10-J43.9). Electronically Signed   By: Eddie Candle M.D.   On: 05/17/2020 07:41     ASSESSMENT/PLAN:  This is a very pleasant 65 year old African American female with metastatic non-small cell lung cancer, adenocarcinoma initially diagnosed as IIIA in July 2019. She had evidence of metastatic disease with brain metastases in October 2020.She has no actionable mutations.  She is status post 7 cycles of concurrent chemoradiation with  carboplatin for an AUC of 2 and paclitaxel 45 mg/m2. She had a partial response.   She then underwentconsolidation immunotherapy with imfinzi. She is status post 18 cycles.   She completed SBRT to the right supraclavicular lymph node.  She had evidence of multiple brain metastases in October 2020. She underwent SRS treatment to these lesions under the care of Dr. Lisbeth Renshaw.  She currently is undergoing systemic chemotherapy with carboplatin for an AUC of 5, Alimta 500 mg/m2 and Keytruda 200 mg IV every 3 weeks. She is status post 13 cycles. Starting from cycle #5 she started maintenance alimta and [place].   The patient recently had a restaging CT scan performed. Dr. Julien Nordmann personally and independently reviewed the scan and discussed the results with the patient. The scan did not show any evidence of disease progression. Dr. Julien Nordmann recommends she proceed on the same treatment at the same dose.    She will proceed with cycle #14 today as scheduled.   We will see her back for a follow up visit in 3 weeks for evaluation before starting cycle #15.   The patient was advised to call immediately if she has any concerning symptoms in the interval. The patient voices understanding of current disease status and treatment options and is in agreement with the current care plan. All questions were answered. The patient knows to call the clinic with any problems, questions or concerns. We can certainly see the patient much sooner if necessary  No orders of the defined types were placed in this encounter.  Blayke Pinera L Cleto Claggett, PA-C 05/21/20  ADDENDUM: Hematology/Oncology Attending: I had a face-to-face encounter with the patient today.  I reviewed her record as well as the scan and discussed the scan results and recommended the care plan. This is a very pleasant 65 years old African-American female  with metastatic non-small cell lung cancer, adenocarcinoma with no actionable mutations who was  initially diagnosed as a stage IIIa in July 2019 status post a course of concurrent chemoradiation as well as consolidation immunotherapy with Imfinzi for 18 cycles discontinued secondary to disease progression.  The patient started first-line chemotherapy with induction carboplatin, Alimta and Keytruda followed by maintenance treatment with Alimta and Keytruda for a total treatment of 13 cycles.  She has been tolerating this treatment well with no concerning adverse effects except for fatigue for few days. She had repeat CT scan of the chest, abdomen pelvis performed recently.  I personally and independently reviewed the scan and discussed the results with the patient today. Her scan showed no concerning findings for disease progression. I recommended for the patient to continue her current treatment with maintenance Alimta and Keytruda and she will proceed with cycle #14 today. The patient will come back for follow-up visit in 3 weeks for evaluation before the next cycle of her treatment. She was advised to call immediately if she has any other concerning symptoms in the interval.  Disclaimer: This note was dictated with voice recognition software. Similar sounding words can inadvertently be transcribed and may be missed upon review. Eilleen Kempf, MD 05/21/20

## 2020-05-21 ENCOUNTER — Inpatient Hospital Stay: Payer: Medicare PPO

## 2020-05-21 ENCOUNTER — Other Ambulatory Visit: Payer: Self-pay

## 2020-05-21 ENCOUNTER — Telehealth: Payer: Self-pay | Admitting: Physician Assistant

## 2020-05-21 ENCOUNTER — Inpatient Hospital Stay: Payer: Medicare PPO | Admitting: Physician Assistant

## 2020-05-21 VITALS — HR 110

## 2020-05-21 VITALS — BP 142/65 | HR 118 | Temp 97.0°F | Resp 18 | Ht 59.0 in | Wt 117.8 lb

## 2020-05-21 DIAGNOSIS — C3431 Malignant neoplasm of lower lobe, right bronchus or lung: Secondary | ICD-10-CM

## 2020-05-21 DIAGNOSIS — C3491 Malignant neoplasm of unspecified part of right bronchus or lung: Secondary | ICD-10-CM

## 2020-05-21 DIAGNOSIS — D6481 Anemia due to antineoplastic chemotherapy: Secondary | ICD-10-CM | POA: Diagnosis not present

## 2020-05-21 DIAGNOSIS — Z5112 Encounter for antineoplastic immunotherapy: Secondary | ICD-10-CM

## 2020-05-21 DIAGNOSIS — Z923 Personal history of irradiation: Secondary | ICD-10-CM | POA: Diagnosis not present

## 2020-05-21 DIAGNOSIS — Z5111 Encounter for antineoplastic chemotherapy: Secondary | ICD-10-CM | POA: Diagnosis not present

## 2020-05-21 DIAGNOSIS — E876 Hypokalemia: Secondary | ICD-10-CM | POA: Diagnosis not present

## 2020-05-21 DIAGNOSIS — C770 Secondary and unspecified malignant neoplasm of lymph nodes of head, face and neck: Secondary | ICD-10-CM | POA: Diagnosis not present

## 2020-05-21 DIAGNOSIS — C7931 Secondary malignant neoplasm of brain: Secondary | ICD-10-CM | POA: Diagnosis not present

## 2020-05-21 DIAGNOSIS — R682 Dry mouth, unspecified: Secondary | ICD-10-CM | POA: Diagnosis not present

## 2020-05-21 LAB — CMP (CANCER CENTER ONLY)
ALT: 19 U/L (ref 0–44)
AST: 39 U/L (ref 15–41)
Albumin: 3.4 g/dL — ABNORMAL LOW (ref 3.5–5.0)
Alkaline Phosphatase: 72 U/L (ref 38–126)
Anion gap: 12 (ref 5–15)
BUN: 13 mg/dL (ref 8–23)
CO2: 22 mmol/L (ref 22–32)
Calcium: 10.5 mg/dL — ABNORMAL HIGH (ref 8.9–10.3)
Chloride: 106 mmol/L (ref 98–111)
Creatinine: 1.05 mg/dL — ABNORMAL HIGH (ref 0.44–1.00)
GFR, Estimated: 59 mL/min — ABNORMAL LOW (ref >60)
Glucose, Bld: 87 mg/dL (ref 70–99)
Potassium: 3.6 mmol/L (ref 3.5–5.1)
Sodium: 140 mmol/L (ref 135–145)
Total Bilirubin: 0.2 mg/dL — ABNORMAL LOW (ref 0.3–1.2)
Total Protein: 8.4 g/dL — ABNORMAL HIGH (ref 6.5–8.1)

## 2020-05-21 LAB — CBC WITH DIFFERENTIAL (CANCER CENTER ONLY)
Abs Immature Granulocytes: 0.03 10*3/uL (ref 0.00–0.07)
Basophils Absolute: 0 10*3/uL (ref 0.0–0.1)
Basophils Relative: 0 %
Eosinophils Absolute: 0 10*3/uL (ref 0.0–0.5)
Eosinophils Relative: 0 %
HCT: 33.3 % — ABNORMAL LOW (ref 36.0–46.0)
Hemoglobin: 10.5 g/dL — ABNORMAL LOW (ref 12.0–15.0)
Immature Granulocytes: 0 %
Lymphocytes Relative: 9 %
Lymphs Abs: 0.9 10*3/uL (ref 0.7–4.0)
MCH: 33.2 pg (ref 26.0–34.0)
MCHC: 31.5 g/dL (ref 30.0–36.0)
MCV: 105.4 fL — ABNORMAL HIGH (ref 80.0–100.0)
Monocytes Absolute: 0.6 10*3/uL (ref 0.1–1.0)
Monocytes Relative: 6 %
Neutro Abs: 8 10*3/uL — ABNORMAL HIGH (ref 1.7–7.7)
Neutrophils Relative %: 85 %
Platelet Count: 529 10*3/uL — ABNORMAL HIGH (ref 150–400)
RBC: 3.16 MIL/uL — ABNORMAL LOW (ref 3.87–5.11)
RDW: 14.7 % (ref 11.5–15.5)
WBC Count: 9.5 10*3/uL (ref 4.0–10.5)
nRBC: 0 % (ref 0.0–0.2)

## 2020-05-21 MED ORDER — SODIUM CHLORIDE 0.9 % IV SOLN
Freq: Once | INTRAVENOUS | Status: AC
  Filled 2020-05-21: qty 250

## 2020-05-21 MED ORDER — PROCHLORPERAZINE MALEATE 10 MG PO TABS
10.0000 mg | ORAL_TABLET | Freq: Once | ORAL | Status: AC
  Administered 2020-05-21: 10 mg via ORAL

## 2020-05-21 MED ORDER — CYANOCOBALAMIN 1000 MCG/ML IJ SOLN
1000.0000 ug | Freq: Once | INTRAMUSCULAR | Status: AC
  Administered 2020-05-21: 1000 ug via INTRAMUSCULAR

## 2020-05-21 MED ORDER — SODIUM CHLORIDE 0.9 % IV SOLN
500.0000 mg/m2 | Freq: Once | INTRAVENOUS | Status: AC
  Administered 2020-05-21: 800 mg via INTRAVENOUS
  Filled 2020-05-21: qty 20

## 2020-05-21 MED ORDER — CYANOCOBALAMIN 1000 MCG/ML IJ SOLN
INTRAMUSCULAR | Status: AC
  Filled 2020-05-21: qty 1

## 2020-05-21 MED ORDER — SODIUM CHLORIDE 0.9 % IV SOLN
200.0000 mg | Freq: Once | INTRAVENOUS | Status: AC
  Administered 2020-05-21: 200 mg via INTRAVENOUS
  Filled 2020-05-21: qty 8

## 2020-05-21 MED ORDER — PROCHLORPERAZINE MALEATE 10 MG PO TABS
ORAL_TABLET | ORAL | Status: AC
  Filled 2020-05-21: qty 1

## 2020-05-21 NOTE — Patient Instructions (Signed)
[facility] Discharge Instructions for Patients Receiving Chemotherapy  Today you received the following chemotherapy agents: Pembrolizumab (Keytruda) and Pemetrexed (Alimta)  To help prevent nausea and vomiting after your treatment, we encourage you to take your nausea medication  as prescribed.    If you develop nausea and vomiting that is not controlled by your nausea medication, call the clinic.   BELOW ARE SYMPTOMS THAT SHOULD BE REPORTED IMMEDIATELY:  *FEVER GREATER THAN 100.5 F  *CHILLS WITH OR WITHOUT FEVER  NAUSEA AND VOMITING THAT IS NOT CONTROLLED WITH YOUR NAUSEA MEDICATION  *UNUSUAL SHORTNESS OF BREATH  *UNUSUAL BRUISING OR BLEEDING  TENDERNESS IN MOUTH AND THROAT WITH OR WITHOUT PRESENCE OF ULCERS  *URINARY PROBLEMS  *BOWEL PROBLEMS  UNUSUAL RASH Items with * indicate a potential emergency and should be followed up as soon as possible.  Feel free to call the clinic should you have any questions or concerns. The clinic phone number is [phone].  Please show the [place] at check-in to the Emergency Department and triage nurse.

## 2020-05-21 NOTE — Telephone Encounter (Signed)
All appointments scheduled from previous LOS. No new appointments scheduled.

## 2020-05-21 NOTE — Progress Notes (Signed)
Per Cassie Heilingoetter, PA - okay to treat with elevated heart rate.

## 2020-05-24 ENCOUNTER — Other Ambulatory Visit: Payer: Self-pay

## 2020-05-24 ENCOUNTER — Ambulatory Visit: Payer: Medicare PPO | Admitting: Rheumatology

## 2020-05-24 ENCOUNTER — Encounter: Payer: Self-pay | Admitting: Rheumatology

## 2020-05-24 VITALS — BP 118/69 | HR 108 | Resp 15 | Ht 60.0 in | Wt 120.8 lb

## 2020-05-24 DIAGNOSIS — C3491 Malignant neoplasm of unspecified part of right bronchus or lung: Secondary | ICD-10-CM

## 2020-05-24 DIAGNOSIS — Z79899 Other long term (current) drug therapy: Secondary | ICD-10-CM | POA: Diagnosis not present

## 2020-05-24 DIAGNOSIS — C3431 Malignant neoplasm of lower lobe, right bronchus or lung: Secondary | ICD-10-CM | POA: Diagnosis not present

## 2020-05-24 DIAGNOSIS — M0579 Rheumatoid arthritis with rheumatoid factor of multiple sites without organ or systems involvement: Secondary | ICD-10-CM

## 2020-05-24 DIAGNOSIS — M65332 Trigger finger, left middle finger: Secondary | ICD-10-CM

## 2020-05-24 DIAGNOSIS — M5030 Other cervical disc degeneration, unspecified cervical region: Secondary | ICD-10-CM

## 2020-05-24 DIAGNOSIS — M65331 Trigger finger, right middle finger: Secondary | ICD-10-CM | POA: Diagnosis not present

## 2020-05-24 DIAGNOSIS — Z7189 Other specified counseling: Secondary | ICD-10-CM | POA: Diagnosis not present

## 2020-05-24 DIAGNOSIS — E559 Vitamin D deficiency, unspecified: Secondary | ICD-10-CM

## 2020-06-01 ENCOUNTER — Other Ambulatory Visit: Payer: Self-pay | Admitting: Radiation Therapy

## 2020-06-01 DIAGNOSIS — C7931 Secondary malignant neoplasm of brain: Secondary | ICD-10-CM

## 2020-06-08 ENCOUNTER — Other Ambulatory Visit: Payer: Self-pay | Admitting: Radiation Therapy

## 2020-06-11 ENCOUNTER — Other Ambulatory Visit: Payer: Self-pay

## 2020-06-11 ENCOUNTER — Encounter: Payer: Self-pay | Admitting: Internal Medicine

## 2020-06-11 ENCOUNTER — Inpatient Hospital Stay: Payer: Medicare PPO

## 2020-06-11 ENCOUNTER — Inpatient Hospital Stay: Payer: Medicare PPO | Attending: Internal Medicine | Admitting: Internal Medicine

## 2020-06-11 VITALS — BP 138/69 | HR 102 | Temp 97.9°F | Resp 13 | Ht 60.0 in | Wt 119.3 lb

## 2020-06-11 DIAGNOSIS — I2510 Atherosclerotic heart disease of native coronary artery without angina pectoris: Secondary | ICD-10-CM | POA: Insufficient documentation

## 2020-06-11 DIAGNOSIS — C3491 Malignant neoplasm of unspecified part of right bronchus or lung: Secondary | ICD-10-CM

## 2020-06-11 DIAGNOSIS — D6481 Anemia due to antineoplastic chemotherapy: Secondary | ICD-10-CM | POA: Diagnosis not present

## 2020-06-11 DIAGNOSIS — Z5111 Encounter for antineoplastic chemotherapy: Secondary | ICD-10-CM | POA: Diagnosis not present

## 2020-06-11 DIAGNOSIS — C770 Secondary and unspecified malignant neoplasm of lymph nodes of head, face and neck: Secondary | ICD-10-CM | POA: Insufficient documentation

## 2020-06-11 DIAGNOSIS — C7931 Secondary malignant neoplasm of brain: Secondary | ICD-10-CM | POA: Insufficient documentation

## 2020-06-11 DIAGNOSIS — J439 Emphysema, unspecified: Secondary | ICD-10-CM | POA: Diagnosis not present

## 2020-06-11 DIAGNOSIS — E876 Hypokalemia: Secondary | ICD-10-CM | POA: Insufficient documentation

## 2020-06-11 DIAGNOSIS — C3431 Malignant neoplasm of lower lobe, right bronchus or lung: Secondary | ICD-10-CM

## 2020-06-11 DIAGNOSIS — R5383 Other fatigue: Secondary | ICD-10-CM | POA: Diagnosis not present

## 2020-06-11 DIAGNOSIS — R682 Dry mouth, unspecified: Secondary | ICD-10-CM | POA: Diagnosis not present

## 2020-06-11 DIAGNOSIS — I700 Atherosclerosis of aorta: Secondary | ICD-10-CM | POA: Diagnosis not present

## 2020-06-11 DIAGNOSIS — Z923 Personal history of irradiation: Secondary | ICD-10-CM | POA: Insufficient documentation

## 2020-06-11 DIAGNOSIS — Z79899 Other long term (current) drug therapy: Secondary | ICD-10-CM | POA: Diagnosis not present

## 2020-06-11 DIAGNOSIS — M069 Rheumatoid arthritis, unspecified: Secondary | ICD-10-CM | POA: Diagnosis not present

## 2020-06-11 DIAGNOSIS — R110 Nausea: Secondary | ICD-10-CM | POA: Insufficient documentation

## 2020-06-11 DIAGNOSIS — Z5112 Encounter for antineoplastic immunotherapy: Secondary | ICD-10-CM

## 2020-06-11 LAB — CBC WITH DIFFERENTIAL (CANCER CENTER ONLY)
Abs Immature Granulocytes: 0.03 10*3/uL (ref 0.00–0.07)
Basophils Absolute: 0 10*3/uL (ref 0.0–0.1)
Basophils Relative: 1 %
Eosinophils Absolute: 0 10*3/uL (ref 0.0–0.5)
Eosinophils Relative: 1 %
HCT: 29.9 % — ABNORMAL LOW (ref 36.0–46.0)
Hemoglobin: 9.9 g/dL — ABNORMAL LOW (ref 12.0–15.0)
Immature Granulocytes: 0 %
Lymphocytes Relative: 14 %
Lymphs Abs: 1 10*3/uL (ref 0.7–4.0)
MCH: 34.3 pg — ABNORMAL HIGH (ref 26.0–34.0)
MCHC: 33.1 g/dL (ref 30.0–36.0)
MCV: 103.5 fL — ABNORMAL HIGH (ref 80.0–100.0)
Monocytes Absolute: 0.7 10*3/uL (ref 0.1–1.0)
Monocytes Relative: 10 %
Neutro Abs: 5.7 10*3/uL (ref 1.7–7.7)
Neutrophils Relative %: 74 %
Platelet Count: 421 10*3/uL — ABNORMAL HIGH (ref 150–400)
RBC: 2.89 MIL/uL — ABNORMAL LOW (ref 3.87–5.11)
RDW: 14.9 % (ref 11.5–15.5)
WBC Count: 7.5 10*3/uL (ref 4.0–10.5)
nRBC: 0 % (ref 0.0–0.2)

## 2020-06-11 LAB — CMP (CANCER CENTER ONLY)
ALT: 17 U/L (ref 0–44)
AST: 30 U/L (ref 15–41)
Albumin: 3.3 g/dL — ABNORMAL LOW (ref 3.5–5.0)
Alkaline Phosphatase: 70 U/L (ref 38–126)
Anion gap: 10 (ref 5–15)
BUN: 13 mg/dL (ref 8–23)
CO2: 20 mmol/L — ABNORMAL LOW (ref 22–32)
Calcium: 10.1 mg/dL (ref 8.9–10.3)
Chloride: 110 mmol/L (ref 98–111)
Creatinine: 0.97 mg/dL (ref 0.44–1.00)
GFR, Estimated: >60 mL/min (ref >60)
Glucose, Bld: 85 mg/dL (ref 70–99)
Potassium: 4 mmol/L (ref 3.5–5.1)
Sodium: 140 mmol/L (ref 135–145)
Total Bilirubin: 0.2 mg/dL — ABNORMAL LOW (ref 0.3–1.2)
Total Protein: 7.7 g/dL (ref 6.5–8.1)

## 2020-06-11 MED ORDER — SODIUM CHLORIDE 0.9 % IV SOLN
500.0000 mg/m2 | Freq: Once | INTRAVENOUS | Status: AC
  Administered 2020-06-11: 800 mg via INTRAVENOUS
  Filled 2020-06-11: qty 20

## 2020-06-11 MED ORDER — PROCHLORPERAZINE MALEATE 10 MG PO TABS
10.0000 mg | ORAL_TABLET | Freq: Once | ORAL | Status: AC
  Administered 2020-06-11: 10 mg via ORAL

## 2020-06-11 MED ORDER — PROCHLORPERAZINE MALEATE 10 MG PO TABS
ORAL_TABLET | ORAL | Status: AC
  Filled 2020-06-11: qty 1

## 2020-06-11 MED ORDER — SODIUM CHLORIDE 0.9 % IV SOLN
Freq: Once | INTRAVENOUS | Status: AC
  Filled 2020-06-11: qty 250

## 2020-06-11 MED ORDER — SODIUM CHLORIDE 0.9 % IV SOLN
200.0000 mg | Freq: Once | INTRAVENOUS | Status: AC
  Administered 2020-06-11: 200 mg via INTRAVENOUS
  Filled 2020-06-11: qty 8

## 2020-06-11 NOTE — Patient Instructions (Signed)
[facility] Discharge Instructions for Patients Receiving Chemotherapy  Today you received the following chemotherapy agents Keytruda; Alimta  To help prevent nausea and vomiting after your treatment, we encourage you to take your nausea medication as directed   If you develop nausea and vomiting that is not controlled by your nausea medication, call the clinic.   BELOW ARE SYMPTOMS THAT SHOULD BE REPORTED IMMEDIATELY:  *FEVER GREATER THAN 100.5 F  *CHILLS WITH OR WITHOUT FEVER  NAUSEA AND VOMITING THAT IS NOT CONTROLLED WITH YOUR NAUSEA MEDICATION  *UNUSUAL SHORTNESS OF BREATH  *UNUSUAL BRUISING OR BLEEDING  TENDERNESS IN MOUTH AND THROAT WITH OR WITHOUT PRESENCE OF ULCERS  *URINARY PROBLEMS  *BOWEL PROBLEMS  UNUSUAL RASH Items with * indicate a potential emergency and should be followed up as soon as possible.  Feel free to call the clinic should you have any questions or concerns. The clinic phone number is [phone].  Please show the CHEMO ALERT CARD at check-in to the Emergency Department and triage nurse.   

## 2020-06-11 NOTE — Progress Notes (Signed)
Susan Holder [phone]   [phone]  OFFICE PROGRESS NOTE  [place] [address]  DIAGNOSIS: Metastatic non-small cell lung cancer initially diagnosed as stage IIIA (T3, N1/N2, M0) non-small cell lung cancer, adenocarcinoma presented with large right lower lobe lung mass with extension to the right hilum and subcarinal area diagnosed in July 2019.  She has brain metastasis in October 2020.  Biomarker Findings Tumor Mutational Burden - TMB-Intermediate (6 Muts/Mb) Microsatellite status - MS-Stable Genomic Findings For a complete list of the genes assayed, please refer to the Appendix. NRAS Q61R ARAF amplification STK11 G56W KRAS G13D MYCN amplification MCL1 amplification NKX2-1 amplification - equivocal? TP53 G245V 7 Disease relevant genes with no reportable alterations: EGFR, ALK, BRAF, MET, ERBB2, RET, ROS1   PRIOR THERAPY:  1) Course of concurrent chemoradiation with weekly carboplatin for AUC of 2 and paclitaxel 45 mg/M2.  Status post 7 cycles.  Last dose was giving 01/11/2018. 2) Consolidation treatment with immunotherapy with Imfinzi (Durvalumab) 10 mg/KG every 2 weeks.  First dose February 09, 2018.  Status post 19 cycles. 3) status post stereotactic body radiotherapy to the enlarging right supraclavicular lymphadenopathy under the care of Dr. Lisbeth Renshaw. 4) SRS to multiple brain metastasis under the care of Dr. Lisbeth Renshaw.  CURRENT THERAPY: Systemic chemotherapy with carboplatin for AUC of 5, Alimta 500 mg/M2 and Keytruda 200 mg IV every 3 weeks.  First dose 08/16/2019.  Status post 14 cycles.  Starting from cycle #5 the patient is on maintenance treatment with Alimta and Keytruda every 3 weeks.  INTERVAL HISTORY: Susan Holder 65 y.o. female returns to the clinic today for follow-up visit.  The patient is feeling fine today with no concerning complaints except for fatigue and occasional  nausea.  She denied having any current chest pain, shortness of breath, cough or hemoptysis.  She denied having any fever or chills.  She has no vomiting, diarrhea or constipation.  She has no headache or visual changes.  She continues to tolerate her treatment with maintenance Alimta and Keytruda fairly well.  She is here today for evaluation before starting cycle #15.  MEDICAL HISTORY: Past Medical History:  Diagnosis Date  . met lung ca dx'd 09/2017   neck LN and brain 2020  . Rheumatoid arthritis ([place])     ALLERGIES:  has No Known Allergies.  MEDICATIONS:  Current Outpatient Medications  Medication Sig Dispense Refill  . diclofenac sodium (VOLTAREN) 1 % GEL Apply 2 g topically 4 (four) times daily.    . Fluocinolone Acetonide Scalp 0.01 % OIL     . folic acid (FOLVITE) 1 MG tablet TAKE 1 TABLET(1 MG) BY MOUTH DAILY 30 tablet 4  . hydroxychloroquine (PLAQUENIL) 200 MG tablet TAKE 1 TABLET BY MOUTH TWICE DAILY MONDAY-FRIDAY 120 tablet 0  . naproxen sodium (ALEVE) 220 MG tablet Take 220 mg by mouth daily as needed (PAIN).    Marland Kitchen omeprazole (PRILOSEC) 20 MG capsule Take 20 mg by mouth daily.    Marland Kitchen Phenylephrine-DM-GG-APAP (DELSYM COUGH/COLD DAYTIME PO) Take by mouth as needed.     . prochlorperazine (COMPAZINE) 10 MG tablet TAKE 1 TABLET(10 MG) BY MOUTH EVERY 6 HOURS AS NEEDED FOR NAUSEA OR VOMITING 30 tablet 0  . pseudoephedrine-guaifenesin (MUCINEX D) 60-600 MG 12 hr tablet Take 1 tablet by mouth as needed.     Marland Kitchen VITAMIN D PO Take by mouth.     No current facility-administered medications for this  visit.    SURGICAL HISTORY:  Past Surgical History:  Procedure Laterality Date  . BRONCHIAL NEEDLE ASPIRATION BIOPSY  11/09/2017   Procedure: BRONCHIAL NEEDLE ASPIRATION BIOPSIES;  Surgeon: Marshell Garfinkel, MD;  Location: [facility];  Service: Cardiopulmonary;;  . ENDOBRONCHIAL ULTRASOUND Bilateral 11/09/2017   Procedure: ENDOBRONCHIAL ULTRASOUND;  Surgeon: Marshell Garfinkel, MD;  Location:  [facility];  Service: Cardiopulmonary;  Laterality: Bilateral;  . NASAL SINUS SURGERY    . NASAL SINUS SURGERY    . TUBAL LIGATION    . VIDEO BRONCHOSCOPY  11/09/2017   Procedure: VIDEO BRONCHOSCOPY;  Surgeon: Marshell Garfinkel, MD;  Location: [facility];  Service: Cardiopulmonary;;    REVIEW OF SYSTEMS:  A comprehensive review of systems was negative except for: Constitutional: positive for fatigue Gastrointestinal: positive for nausea   PHYSICAL EXAMINATION: General appearance: alert, cooperative, fatigued and no distress Head: Normocephalic, without obvious abnormality, atraumatic Neck: no adenopathy, no JVD, supple, symmetrical, trachea midline and thyroid not enlarged, symmetric, no tenderness/mass/nodules Lymph nodes: Cervical, supraclavicular, and axillary nodes normal. Resp: clear to auscultation bilaterally Back: symmetric, no curvature. ROM normal. No CVA tenderness. Cardio: regular rate and rhythm, S1, S2 normal, no murmur, click, rub or gallop GI: soft, non-tender; bowel sounds normal; no masses,  no organomegaly Extremities: extremities normal, atraumatic, no cyanosis or edema  ECOG PERFORMANCE STATUS: 1 - Symptomatic but completely ambulatory  Blood pressure 138/69, pulse (!) 102, temperature 97.9 F (36.6 C), temperature source Tympanic, resp. rate 13, height 5' (1.524 m), weight 119 lb 4.8 oz (54.1 kg), last menstrual period 04/28/2000, SpO2 99 %.  LABORATORY DATA: Lab Results  Component Value Date   WBC 9.5 05/21/2020   HGB 10.5 (L) 05/21/2020   HCT 33.3 (L) 05/21/2020   MCV 105.4 (H) 05/21/2020   PLT 529 (H) 05/21/2020      Chemistry      Component Value Date/Time   NA 140 05/21/2020 0848   K 3.6 05/21/2020 0848   CL 106 05/21/2020 0848   CO2 22 05/21/2020 0848   BUN 13 05/21/2020 0848   CREATININE 1.05 (H) 05/21/2020 0848   CREATININE 0.80 07/24/2017 1438      Component Value Date/Time   CALCIUM 10.5 (H) 05/21/2020 0848   ALKPHOS 72 05/21/2020 0848    AST 39 05/21/2020 0848   ALT 19 05/21/2020 0848   BILITOT 0.2 (L) 05/21/2020 0848       RADIOGRAPHIC STUDIES: CT Chest W Contrast  Result Date: 05/17/2020 CLINICAL DATA:  Non-small cell lung cancer restaging EXAM: CT CHEST, ABDOMEN, AND PELVIS WITH CONTRAST TECHNIQUE: Multidetector CT imaging of the chest, abdomen and pelvis was performed following the standard protocol during bolus administration of intravenous contrast. CONTRAST:  145m OMNIPAQUE IOHEXOL 300 MG/ML SOLN, additional oral enteric contrast COMPARISON:  03/02/2020 FINDINGS: CT CHEST FINDINGS Cardiovascular: Aortic atherosclerosis. Normal heart size. Right coronary artery calcifications. No pericardial effusion. Mediastinum/Nodes: Unchanged post treatment soft tissue thickening about the right hilum. No discretely enlarged lymph nodes. Thyroid gland, trachea, and esophagus demonstrate no significant findings. Lungs/Pleura: Moderate centrilobular emphysema. Unchanged post treatment appearance of the right lung with dense perihilar and infrahilar post treatment consolidation and fibrosis. Unchanged 5 mm pulmonary nodule of the left lower lobe (series 4, image 65). No pleural effusion or pneumothorax. Musculoskeletal: No chest wall mass or suspicious bone lesions identified. CT ABDOMEN PELVIS FINDINGS Hepatobiliary: No solid liver abnormality is seen. No gallstones, gallbladder wall thickening, or biliary dilatation. Pancreas: Unremarkable. No pancreatic ductal dilatation or surrounding inflammatory changes. Spleen: Normal in  size without significant abnormality. Adrenals/Urinary Tract: Adrenal glands are unremarkable. Kidneys are normal, without renal calculi, solid lesion, or hydronephrosis. Bladder is unremarkable. Stomach/Bowel: Stomach is within normal limits. Appendix appears normal. No evidence of bowel wall thickening, distention, or inflammatory changes. Sigmoid diverticula. Moderate burden of stool throughout the colon and rectum.  Vascular/Lymphatic: No significant vascular findings are present. No enlarged abdominal or pelvic lymph nodes. Reproductive: No mass or other abnormality. Bilateral tubal ligation clips. Other: No abdominal wall hernia or abnormality. No abdominopelvic ascites. Musculoskeletal: No acute or significant osseous findings. IMPRESSION: 1. Unchanged post treatment appearance of the right lung with dense perihilar and infrahilar post treatment consolidation and fibrosis as well as post treatment appearance of soft tissue about the right hilum. No evidence of malignant recurrence. 2. Unchanged 5 mm pulmonary nodule of the left lower lobe. Attention on follow-up. 3. No evidence of metastatic disease within the abdomen or pelvis. 4. Emphysema. 5. Coronary artery disease. Aortic Atherosclerosis (ICD10-I70.0) and Emphysema (ICD10-J43.9). Electronically Signed   By: Eddie Candle M.D.   On: 05/17/2020 07:41   CT Abdomen Pelvis W Contrast  Result Date: 05/17/2020 CLINICAL DATA:  Non-small cell lung cancer restaging EXAM: CT CHEST, ABDOMEN, AND PELVIS WITH CONTRAST TECHNIQUE: Multidetector CT imaging of the chest, abdomen and pelvis was performed following the standard protocol during bolus administration of intravenous contrast. CONTRAST:  173m OMNIPAQUE IOHEXOL 300 MG/ML SOLN, additional oral enteric contrast COMPARISON:  03/02/2020 FINDINGS: CT CHEST FINDINGS Cardiovascular: Aortic atherosclerosis. Normal heart size. Right coronary artery calcifications. No pericardial effusion. Mediastinum/Nodes: Unchanged post treatment soft tissue thickening about the right hilum. No discretely enlarged lymph nodes. Thyroid gland, trachea, and esophagus demonstrate no significant findings. Lungs/Pleura: Moderate centrilobular emphysema. Unchanged post treatment appearance of the right lung with dense perihilar and infrahilar post treatment consolidation and fibrosis. Unchanged 5 mm pulmonary nodule of the left lower lobe (series 4, image  65). No pleural effusion or pneumothorax. Musculoskeletal: No chest wall mass or suspicious bone lesions identified. CT ABDOMEN PELVIS FINDINGS Hepatobiliary: No solid liver abnormality is seen. No gallstones, gallbladder wall thickening, or biliary dilatation. Pancreas: Unremarkable. No pancreatic ductal dilatation or surrounding inflammatory changes. Spleen: Normal in size without significant abnormality. Adrenals/Urinary Tract: Adrenal glands are unremarkable. Kidneys are normal, without renal calculi, solid lesion, or hydronephrosis. Bladder is unremarkable. Stomach/Bowel: Stomach is within normal limits. Appendix appears normal. No evidence of bowel wall thickening, distention, or inflammatory changes. Sigmoid diverticula. Moderate burden of stool throughout the colon and rectum. Vascular/Lymphatic: No significant vascular findings are present. No enlarged abdominal or pelvic lymph nodes. Reproductive: No mass or other abnormality. Bilateral tubal ligation clips. Other: No abdominal wall hernia or abnormality. No abdominopelvic ascites. Musculoskeletal: No acute or significant osseous findings. IMPRESSION: 1. Unchanged post treatment appearance of the right lung with dense perihilar and infrahilar post treatment consolidation and fibrosis as well as post treatment appearance of soft tissue about the right hilum. No evidence of malignant recurrence. 2. Unchanged 5 mm pulmonary nodule of the left lower lobe. Attention on follow-up. 3. No evidence of metastatic disease within the abdomen or pelvis. 4. Emphysema. 5. Coronary artery disease. Aortic Atherosclerosis (ICD10-I70.0) and Emphysema (ICD10-J43.9). Electronically Signed   By: AEddie CandleM.D.   On: 05/17/2020 07:41    ASSESSMENT AND PLAN: This is a very pleasant 65years old African-American female with metastatic non-small cell lung cancer initially diagnosed with a stage IIIA non-small cell lung cancer, adenocarcinoma.  She underwent a course of  concurrent chemoradiation with  weekly carboplatin and paclitaxel status post 7 cycles with partial response.   The patient tolerated this course of treatment well except for mild odynophagia and dysphagia. She completed on consolidation treatment with immunotherapy with Imfinzi (Durvalumab) status post 18 cycles. She also completed SBRT to the right supraclavicular lymphadenopathy. The patient had evidence for multiple brain metastasis in October 2020 and she underwent SRS treatment to this lesion under the care of Dr. Lisbeth Renshaw. The patient had evidence for disease progression and she started systemic chemotherapy with carboplatin, Alimta and Keytruda status post 14 cycles.  Starting from cycle #5 she is on maintenance treatment with Alimta and Keytruda every 3 weeks. The patient continues to tolerate her treatment well except for the fatigue and occasional nausea. I recommended for her to proceed with cycle #15 today as planned. The patient will come back for follow-up visit in 3 weeks for evaluation before starting cycle #16. For the chemotherapy-induced anemia, I advised the patient to start taking multivitamins and iron tablets at least once daily. For the mild hypokalemia, she was advised to increase her potassium rich diet. For the dry mouth she will continue with Biotene and salivasure as needed. She was advised to call immediately if she has any other concerning symptoms in the interval. The patient voices understanding of current disease status and treatment options and is in agreement with the current care plan. All questions were answered. The patient knows to call the clinic with any problems, questions or concerns. We can certainly see the patient much sooner if necessary.  Disclaimer: This note was dictated with voice recognition software. Similar sounding words can inadvertently be transcribed and may not be corrected upon review.

## 2020-06-11 NOTE — Progress Notes (Signed)
Per Dr. Julien Nordmann, okay to treat with HR 102.

## 2020-06-13 DIAGNOSIS — C3490 Malignant neoplasm of unspecified part of unspecified bronchus or lung: Secondary | ICD-10-CM | POA: Diagnosis not present

## 2020-06-25 ENCOUNTER — Telehealth: Payer: Self-pay

## 2020-06-25 ENCOUNTER — Encounter: Payer: Self-pay | Admitting: Radiation Oncology

## 2020-06-25 NOTE — Telephone Encounter (Signed)
Spoke with patient in regards to telephone appointment with Shona Simpson PA on 07/02/20 @ 2:30pm. Patient verbalized understanding of appointment date and time. Reviewed meaningful use questions. TM

## 2020-06-27 NOTE — Progress Notes (Signed)
[facility] OFFICE PROGRESS NOTE  Susan Nian, DO [place] [address]  DIAGNOSIS: Metastatic non-small cell lung cancer initially diagnosed as stage IIIA (T3,N1/N2, M0)non-small cell lung cancer, adenocarcinoma presented with large right lower lobe lung mass with extension to the right hilum and subcarinal area diagnosed in July 2019. She has brain metastasis in October 2020.  Biomarker Findings Tumor Mutational Burden - TMB-Intermediate (6 Muts/Mb) Microsatellite status - MS-Stable Genomic Findings For a complete list of the genes assayed, please refer to the Appendix. NRAS Q61R ARAF amplification STK11 G56W KRAS G13D MYCN amplification MCL1 amplification NKX2-1 amplification - equivocal? TP53 G245V 7 Disease relevant genes with no reportable alterations: EGFR, ALK, BRAF, MET, ERBB2, RET, ROS1  PRIOR THERAPY: 1) Course of concurrent chemoradiation with weekly carboplatin for AUC of 2 and paclitaxel 45 mg/M2. Status post 7 cycles. Last dose was giving 01/11/2018. 2) Consolidation treatment with immunotherapy with Imfinzi (Durvalumab) 10 mg/KG every 2 weeks. First dose February 09, 2018. Status post 19 cycles. 3) status post stereotactic body radiotherapy to the enlarging right supraclavicular lymphadenopathy under the care of Dr. Lisbeth Renshaw. 4) SRS to multiple brain metastasis under the care of Dr. Lisbeth Renshaw.  CURRENT THERAPY: Systemic chemotherapy with carboplatin for AUC of 5, Alimta 500 mg/M2 and Keytruda 200 mg IV every 3 weeks. First dose 08/16/2019. Status post15cycles. Starting from cycle #5 the patient is on maintenance treatment with Alimta and Keytruda every 3 weeks.  INTERVAL HISTORY: Susan Holder 65 y.o. female returns to the clinic for a follow up visit. The patient is feeling well today without any concerning complaints except for she notes it is challenging for them to get blood off of her in phlebotomy; however,  she does not want a port-a-cath.She also reports dry mouth for which she uses biotene and drinks plenty of water. Her dry mouth is worse at night and she has to keep water by her bed. She does not think she sleeps with her mouth open.   Otherwise, the patient continues to tolerate treatment withAlimta and Keytrudawell without any adverse sideeffectsexcept forfatigue and occasional nausea that first week after infusion. She reports some vomiting but it depends on what she eats. She has compazine for nausea but it makes her drowsy. Denies any fever, chills,or night sweats. Her weight is relatively stable.Denies any chest pain, shortness of breath, or hemoptysis. She reports a baseline dry cough which has been the same for 3 years. Denies any diarrhea or constipation. Denies any headache or visual changes.She follows with radiation oncology for her history of brain metastases.She had a repeat brain MRI on 06/29/20 and has a follow up visit with radiation oncology later today. Denies any rashes or skin changes.The patient is here today for evaluationprior to starting cycle #15  MEDICAL HISTORY: Past Medical History:  Diagnosis Date  . met lung ca dx'd 09/2017   neck LN and brain 2020  . Rheumatoid arthritis ([place])     ALLERGIES:  has No Known Allergies.  MEDICATIONS:  Current Outpatient Medications  Medication Sig Dispense Refill  . ondansetron (ZOFRAN) 8 MG tablet Take 1 tablet (8 mg total) by mouth every 8 (eight) hours as needed for nausea or vomiting. 30 tablet 0  . diclofenac sodium (VOLTAREN) 1 % GEL Apply 2 g topically 4 (four) times daily.    . Fluocinolone Acetonide Scalp 0.01 % OIL     . folic acid (FOLVITE) 1 MG tablet TAKE 1 TABLET(1 MG) BY MOUTH DAILY 30  tablet 4  . hydroxychloroquine (PLAQUENIL) 200 MG tablet TAKE 1 TABLET BY MOUTH TWICE DAILY MONDAY-FRIDAY 120 tablet 0  . naproxen sodium (ALEVE) 220 MG tablet Take 220 mg by mouth daily as needed (PAIN).    Marland Kitchen omeprazole  (PRILOSEC) 20 MG capsule Take 20 mg by mouth daily.    Marland Kitchen Phenylephrine-DM-GG-APAP (DELSYM COUGH/COLD DAYTIME PO) Take by mouth as needed.     . prochlorperazine (COMPAZINE) 10 MG tablet TAKE 1 TABLET(10 MG) BY MOUTH EVERY 6 HOURS AS NEEDED FOR NAUSEA OR VOMITING 30 tablet 0  . pseudoephedrine-guaifenesin (MUCINEX D) 60-600 MG 12 hr tablet Take 1 tablet by mouth as needed.     Marland Kitchen VITAMIN D PO Take by mouth.     No current facility-administered medications for this visit.    SURGICAL HISTORY:  Past Surgical History:  Procedure Laterality Date  . BRONCHIAL NEEDLE ASPIRATION BIOPSY  11/09/2017   Procedure: BRONCHIAL NEEDLE ASPIRATION BIOPSIES;  Surgeon: Marshell Garfinkel, MD;  Location: [facility];  Service: Cardiopulmonary;;  . ENDOBRONCHIAL ULTRASOUND Bilateral 11/09/2017   Procedure: ENDOBRONCHIAL ULTRASOUND;  Surgeon: Marshell Garfinkel, MD;  Location: [facility];  Service: Cardiopulmonary;  Laterality: Bilateral;  . NASAL SINUS SURGERY    . NASAL SINUS SURGERY    . TUBAL LIGATION    . VIDEO BRONCHOSCOPY  11/09/2017   Procedure: VIDEO BRONCHOSCOPY;  Surgeon: Marshell Garfinkel, MD;  Location: [facility];  Service: Cardiopulmonary;;    REVIEW OF SYSTEMS:   Constitutional:Positive for fatigue.Negative for appetite change, chills, fever and unexpected weight change.  HENT: Positive for dry mouth. Negative for mouth sores, nosebleeds, sore throat and trouble swallowing.  Eyes: Negative for eye problems and icterus.  Respiratory: Positive for baseline cough. Negative for hemoptysis, shortness of breath and wheezing.  Cardiovascular: Negative for chest pain and leg swelling.  Gastrointestinal: Positive for occasional nausea and vomiting. Negative for abdominal pain, constipation, or diarrhea..  Genitourinary: Negative for bladder incontinence, difficulty urinating, dysuria, frequency and hematuria.  Musculoskeletal: Negative for back pain, gait problem, neck pain and neck stiffness.  Skin:  Negative for itching and rash.  Neurological: Negative for dizziness, extremity weakness, gait problem, headaches, light-headedness and seizures.  Hematological: Negative for adenopathy. Does not bruise/bleed easily.  Psychiatric/Behavioral: Negative for confusion, depression and sleep disturbance. The patient is not nervous/anxious.   PHYSICAL EXAMINATION:  Blood pressure 130/67, pulse (!) 107, temperature 97.6 F (36.4 C), temperature source Tympanic, resp. rate 14, height 5' (1.524 m), weight 118 lb 14.4 oz (53.9 kg), last menstrual period 04/28/2000, SpO2 100 %.  ECOG PERFORMANCE STATUS: 1 - Symptomatic but completely ambulatory  Physical Exam  Constitutional: Oriented to person, place, and time and well-developed, well-nourished, and in no distress.  HENT:  Head: Normocephalic and atraumatic.  Mouth/Throat: Oropharynx is clear and moist. No oropharyngeal exudate.  Eyes: Conjunctivae are normal. Right eye exhibits no discharge. Left eye exhibits no discharge. No scleral icterus.  Neck: Normal range of motion. Neck supple.  Cardiovascular: Normal rate, regular rhythm, normal heart sounds and intact distal pulses.   Pulmonary/Chest: Effort normal and breath sounds normal. No respiratory distress. No wheezes. No rales.  Abdominal: Soft. Bowel sounds are normal. Exhibits no distension and no mass. There is no tenderness.  Musculoskeletal: Normal range of motion. Exhibits no edema.  Lymphadenopathy:    No cervical adenopathy.  Neurological: Alert and oriented to person, place, and time. Exhibits normal muscle tone. Gait normal. Coordination normal.  Skin: Skin is warm and dry. No rash noted. Not diaphoretic. No erythema.  No pallor.  Psychiatric: Mood, memory and judgment normal.  Vitals reviewed.  LABORATORY DATA: Lab Results  Component Value Date   WBC 7.5 07/02/2020   HGB 10.2 (L) 07/02/2020   HCT 31.2 (L) 07/02/2020   MCV 103.0 (H) 07/02/2020   PLT 481 (H) 07/02/2020       Chemistry      Component Value Date/Time   NA 138 07/02/2020 0857   K 3.8 07/02/2020 0857   CL 106 07/02/2020 0857   CO2 25 07/02/2020 0857   BUN 12 07/02/2020 0857   CREATININE 0.97 07/02/2020 0857   CREATININE 0.80 07/24/2017 1438      Component Value Date/Time   CALCIUM 9.9 07/02/2020 0857   ALKPHOS 74 07/02/2020 0857   AST 36 07/02/2020 0857   ALT 21 07/02/2020 0857   BILITOT <0.2 (L) 07/02/2020 0857       RADIOGRAPHIC STUDIES:  MR Brain W Wo Contrast  Result Date: 06/29/2020 CLINICAL DATA:  Follow-up metastatic lung cancer. EXAM: MRI HEAD WITHOUT AND WITH CONTRAST TECHNIQUE: Multiplanar, multiecho pulse sequences of the brain and surrounding structures were obtained without and with intravenous contrast. CONTRAST:  9m MULTIHANCE GADOBENATE DIMEGLUMINE 529 MG/ML IV SOLN COMPARISON:  03/01/2020.  12/08/2019.  09/07/2019. FINDINGS: Brain: Diffusion imaging does not show any acute or subacute infarction or other cause of restricted diffusion. Background pattern of mild chronic small-vessel ischemic change of the white matter is stable. No hydrocephalus or extra-axial collection. No developing edema or mass effect. Right medial cerebellar metastasis axial image 36 series 12 is unchanged or minimally smaller. Peripheral left inferior cerebellar metastasis axial image 26 is unchanged or minimally smaller. Left lateral cerebellar metastasis axial image 32 is slightly smaller. No evidence of new posterior fossa disease. Right inferior frontal metastasis is unchanged at 3 mm, axial image 63. Punctate metastasis left frontal operculum is barely visible, image 90. No additional supra tentorial lesions are discernible. No new or progressive disease. Vascular: Major vessels at the base of the brain show flow. Skull and upper cervical spine: Negative Sinuses/Orbits: Sinuses are clear. Orbits are negative. Left mastoid effusion. Other: None IMPRESSION: Stable or minimally smaller brain metastases  without evidence of progression, increased edema or new lesion. Three cerebellar lesions and 2 cerebral hemispheric lesions as above. Electronically Signed   By: MNelson ChimesM.D.   On: 06/29/2020 16:02     ASSESSMENT/PLAN:  This is a very pleasant 65year old African American female with metastatic non-small cell lung cancer, adenocarcinoma initially diagnosed as IIIA in July 2019. She had evidence of metastatic disease with brain metastases in October 2020.She has no actionable mutations.  She is status post 7 cycles of concurrent chemoradiation with carboplatin for anAUC of 2 and paclitaxel 45 mg/m2. She had a partial response.   She then underwentconsolidation immunotherapy with imfinzi. She is status post 18 cycles.   She completed SBRT to the right supraclavicular lymphnode.  She had evidence of multiple brain metastases in October 2020. She underwent SRS treatment to theselesionsunder the care of Dr. MLisbeth Renshaw  She currently is undergoing systemic chemotherapy with carboplatin for an AUC of 5, Alimta 500 mg/m2 and Keytruda 200 mg IV every 3 weeks. She is status post15cycles. Starting from cycle #5 she started maintenance Alimta and Keytruda.   Labs were reviewed. Recommend that she proceed with cycle #16 today as scheduled.   We will see her back for a follow up visit in 3 weeks for evaluation before starting cycle #17.   I  will arrange for a restaging CT scan of the chest only prior to starting her next cycle of treatment since her last scan did not show any disease in the abdomen/pelvis.   She will have a telephone visit with radiation oncology later today to review her brain MRI.   For her dry mouth, she will continue to hydrate.   For her nausea and occasional vomiting, I have sent zofran 8 mg every 8 hours to her pharmacy for better control of her nausea since she does not like to take compazine due to drowsiness.   The patient was advised to call immediately if she  has any concerning symptoms in the interval. The patient voices understanding of current disease status and treatment options and is in agreement with the current care plan. All questions were answered. The patient knows to call the clinic with any problems, questions or concerns. We can certainly see the patient much sooner if necessary      Orders Placed This Encounter  Procedures  . CT Chest W Contrast    Standing Status:   Future    Standing Expiration Date:   07/02/2021    Order Specific Question:   If indicated for the ordered procedure, I authorize the administration of contrast media per Radiology protocol    Answer:   Yes    Order Specific Question:   Preferred imaging location?    Answer:   [facility]    Order Specific Question:   Release to patient    Answer:   Immediate  . TSH    Standing Status:   Standing    Number of Occurrences:   18    Standing Expiration Date:   07/02/2021     I spent 20-29 minutes in this encounter.   Adriahna Shearman L Mysty Kielty, PA-C 07/02/20

## 2020-06-29 ENCOUNTER — Other Ambulatory Visit: Payer: Self-pay

## 2020-06-29 ENCOUNTER — Ambulatory Visit
Admission: RE | Admit: 2020-06-29 | Discharge: 2020-06-29 | Disposition: A | Discharge status: Home / Self Care | Payer: Medicare PPO | Source: Ambulatory Visit | Attending: Radiation Oncology | Admitting: Radiation Oncology

## 2020-06-29 DIAGNOSIS — C7931 Secondary malignant neoplasm of brain: Secondary | ICD-10-CM | POA: Diagnosis not present

## 2020-06-29 DIAGNOSIS — H748X2 Other specified disorders of left middle ear and mastoid: Secondary | ICD-10-CM | POA: Diagnosis not present

## 2020-06-29 DIAGNOSIS — G9389 Other specified disorders of brain: Secondary | ICD-10-CM | POA: Diagnosis not present

## 2020-06-29 DIAGNOSIS — Z85118 Personal history of other malignant neoplasm of bronchus and lung: Secondary | ICD-10-CM | POA: Diagnosis not present

## 2020-06-29 MED ORDER — GADOBENATE DIMEGLUMINE 529 MG/ML IV SOLN
10.0000 mL | Freq: Once | INTRAVENOUS | Status: AC | PRN
  Administered 2020-06-29: 10 mL via INTRAVENOUS

## 2020-07-02 ENCOUNTER — Inpatient Hospital Stay: Payer: Medicare PPO

## 2020-07-02 ENCOUNTER — Inpatient Hospital Stay: Payer: Medicare PPO | Attending: Internal Medicine

## 2020-07-02 ENCOUNTER — Ambulatory Visit
Admission: RE | Admit: 2020-07-02 | Discharge: 2020-07-02 | Disposition: A | Discharge status: Home / Self Care | Payer: Medicare PPO | Source: Ambulatory Visit | Attending: Radiation Oncology | Admitting: Radiation Oncology

## 2020-07-02 ENCOUNTER — Encounter: Payer: Self-pay | Admitting: Physician Assistant

## 2020-07-02 ENCOUNTER — Other Ambulatory Visit: Payer: Self-pay

## 2020-07-02 ENCOUNTER — Inpatient Hospital Stay (HOSPITAL_BASED_OUTPATIENT_CLINIC_OR_DEPARTMENT_OTHER): Payer: Medicare PPO | Admitting: Physician Assistant

## 2020-07-02 VITALS — HR 105

## 2020-07-02 VITALS — BP 130/67 | HR 107 | Temp 97.6°F | Resp 14 | Ht 60.0 in | Wt 118.9 lb

## 2020-07-02 DIAGNOSIS — D649 Anemia, unspecified: Secondary | ICD-10-CM | POA: Diagnosis not present

## 2020-07-02 DIAGNOSIS — C7931 Secondary malignant neoplasm of brain: Secondary | ICD-10-CM | POA: Diagnosis not present

## 2020-07-02 DIAGNOSIS — Z79899 Other long term (current) drug therapy: Secondary | ICD-10-CM | POA: Diagnosis not present

## 2020-07-02 DIAGNOSIS — Z5112 Encounter for antineoplastic immunotherapy: Secondary | ICD-10-CM | POA: Diagnosis not present

## 2020-07-02 DIAGNOSIS — Z5111 Encounter for antineoplastic chemotherapy: Secondary | ICD-10-CM | POA: Insufficient documentation

## 2020-07-02 DIAGNOSIS — C3491 Malignant neoplasm of unspecified part of right bronchus or lung: Secondary | ICD-10-CM

## 2020-07-02 DIAGNOSIS — Z08 Encounter for follow-up examination after completed treatment for malignant neoplasm: Secondary | ICD-10-CM | POA: Diagnosis not present

## 2020-07-02 DIAGNOSIS — C770 Secondary and unspecified malignant neoplasm of lymph nodes of head, face and neck: Secondary | ICD-10-CM | POA: Insufficient documentation

## 2020-07-02 DIAGNOSIS — C3431 Malignant neoplasm of lower lobe, right bronchus or lung: Secondary | ICD-10-CM

## 2020-07-02 DIAGNOSIS — R682 Dry mouth, unspecified: Secondary | ICD-10-CM | POA: Diagnosis not present

## 2020-07-02 DIAGNOSIS — M069 Rheumatoid arthritis, unspecified: Secondary | ICD-10-CM | POA: Diagnosis not present

## 2020-07-02 DIAGNOSIS — Z85118 Personal history of other malignant neoplasm of bronchus and lung: Secondary | ICD-10-CM | POA: Diagnosis not present

## 2020-07-02 LAB — CBC WITH DIFFERENTIAL (CANCER CENTER ONLY)
Abs Immature Granulocytes: 0.03 10*3/uL (ref 0.00–0.07)
Basophils Absolute: 0 10*3/uL (ref 0.0–0.1)
Basophils Relative: 0 %
Eosinophils Absolute: 0 10*3/uL (ref 0.0–0.5)
Eosinophils Relative: 0 %
HCT: 31.2 % — ABNORMAL LOW (ref 36.0–46.0)
Hemoglobin: 10.2 g/dL — ABNORMAL LOW (ref 12.0–15.0)
Immature Granulocytes: 0 %
Lymphocytes Relative: 12 %
Lymphs Abs: 0.9 10*3/uL (ref 0.7–4.0)
MCH: 33.7 pg (ref 26.0–34.0)
MCHC: 32.7 g/dL (ref 30.0–36.0)
MCV: 103 fL — ABNORMAL HIGH (ref 80.0–100.0)
Monocytes Absolute: 0.6 10*3/uL (ref 0.1–1.0)
Monocytes Relative: 9 %
Neutro Abs: 5.9 10*3/uL (ref 1.7–7.7)
Neutrophils Relative %: 79 %
Platelet Count: 481 10*3/uL — ABNORMAL HIGH (ref 150–400)
RBC: 3.03 MIL/uL — ABNORMAL LOW (ref 3.87–5.11)
RDW: 15.1 % (ref 11.5–15.5)
WBC Count: 7.5 10*3/uL (ref 4.0–10.5)
nRBC: 0 % (ref 0.0–0.2)

## 2020-07-02 LAB — CMP (CANCER CENTER ONLY)
ALT: 21 U/L (ref 0–44)
AST: 36 U/L (ref 15–41)
Albumin: 3.2 g/dL — ABNORMAL LOW (ref 3.5–5.0)
Alkaline Phosphatase: 74 U/L (ref 38–126)
Anion gap: 7 (ref 5–15)
BUN: 12 mg/dL (ref 8–23)
CO2: 25 mmol/L (ref 22–32)
Calcium: 9.9 mg/dL (ref 8.9–10.3)
Chloride: 106 mmol/L (ref 98–111)
Creatinine: 0.97 mg/dL (ref 0.44–1.00)
GFR, Estimated: >60 mL/min (ref >60)
Glucose, Bld: 87 mg/dL (ref 70–99)
Potassium: 3.8 mmol/L (ref 3.5–5.1)
Sodium: 138 mmol/L (ref 135–145)
Total Bilirubin: <0.2 mg/dL — ABNORMAL LOW (ref 0.3–1.2)
Total Protein: 8 g/dL (ref 6.5–8.1)

## 2020-07-02 MED ORDER — PROCHLORPERAZINE MALEATE 10 MG PO TABS
ORAL_TABLET | ORAL | Status: AC
  Filled 2020-07-02: qty 1

## 2020-07-02 MED ORDER — PALONOSETRON HCL INJECTION 0.25 MG/5ML
INTRAVENOUS | Status: AC
  Filled 2020-07-02: qty 5

## 2020-07-02 MED ORDER — SODIUM CHLORIDE 0.9 % IV SOLN
Freq: Once | INTRAVENOUS | Status: AC
  Filled 2020-07-02: qty 250

## 2020-07-02 MED ORDER — ONDANSETRON HCL 8 MG PO TABS: 8.0000 mg | ORAL_TABLET | Freq: Three times a day (TID) | ORAL | 0 refills | Status: DC | PRN

## 2020-07-02 MED ORDER — SODIUM CHLORIDE 0.9 % IV SOLN
500.0000 mg/m2 | Freq: Once | INTRAVENOUS | Status: AC
  Administered 2020-07-02: 800 mg via INTRAVENOUS
  Filled 2020-07-02: qty 20

## 2020-07-02 MED ORDER — DIPHENHYDRAMINE HCL 25 MG PO CAPS
ORAL_CAPSULE | ORAL | Status: AC
  Filled 2020-07-02: qty 2

## 2020-07-02 MED ORDER — PROCHLORPERAZINE MALEATE 10 MG PO TABS
10.0000 mg | ORAL_TABLET | Freq: Once | ORAL | Status: AC
  Administered 2020-07-02: 10 mg via ORAL

## 2020-07-02 MED ORDER — SODIUM CHLORIDE 0.9 % IV SOLN
200.0000 mg | Freq: Once | INTRAVENOUS | Status: AC
  Administered 2020-07-02: 200 mg via INTRAVENOUS
  Filled 2020-07-02: qty 8

## 2020-07-02 MED ORDER — SODIUM CHLORIDE 0.9 % IV SOLN
Freq: Once | INTRAVENOUS | Status: DC
  Filled 2020-07-02: qty 250

## 2020-07-02 MED ORDER — ACETAMINOPHEN 325 MG PO TABS
ORAL_TABLET | ORAL | Status: AC
  Filled 2020-07-02: qty 2

## 2020-07-02 NOTE — Progress Notes (Signed)
Radiation Oncology         [phone] ________________________________  Outpatient Follow Up - Conducted via telephone due to current COVID-19 concerns for limiting patient exposure  I spoke with the patient to conduct this consult visit via telephone to spare the patient unnecessary potential exposure in the healthcare setting during the current COVID-19 pandemic. The patient was notified in advance and was offered a [place] meeting to allow for face to face communication but unfortunately reported that they did not have the appropriate resources/technology to support such a visit and instead preferred to proceed with a telephone visit.   ________________________________  Name: KARLENE SOUTHARD MRN: [id number]  Date of Service: 07/02/2020  DOB: 03-23-1956  Post Treatment Telephone Note  DIAGNOSIS:   Progressive MetastaticStage IIIA, cT3N1-2M0 NSCLC, adenocarcinoma of the right lower lobe with brain metastases  INTERVAL SINCE LAST RADIOTHERAPY: 1 year, 4 months   03/09/2019 SRS Treatment: Each site was treated to 20 Gy in a single fraction PTV1 Post Cerebellum 16mm PTV2 Lt Cerebellum 50mm PTV3 Lo Rt Vent 1mm PTV4 Rt Frontal 5mm PTV5 Lt Frontal Operc 64mm PTV6 Lt Sylv 53mm  11/16/2018-12/20/2018: The patient's right supraclavicular nodes were treated to 60 Gy over 25 fractions.  11/30/17-01/11/18:  66 Gy to the right chest and regional nodes over 33 fractions  NARRATIVE: Ms. Breunig is a well-known 65 y.o.  patient to our service with a history of adenocarcinoma of the right lower lobe.  When she was originally diagnosed in the summer 2019 she presented with local regional disease and received chemoradiation.  Unfortunately she recurred in the summer 2020, she did receive palliative radiotherapy to the right supraclavicular node, and in November 2020 was found to have 6 lesions in the brain felt to be metastatic for which she underwent SRS treatment in November 2020. She has been  receiving  systemic therapy with carboplatin/alimta and Keytruda, though carboplatin has been held after cycle 4. She has since been without disease in the brain and her last MRI on 06/29/20 showed improvement/stability in the treated sites. There are three cerebellar lesions and 2 cerebral lesions still noted, but no more comments on the PTV3 site. She is in the infusion department for her next infusion.  On review of systems, the patient reports that she is doing well overall.  She denies any headaches, visual or auditory changes. She feels tired and nauseated the weeks she receives chemo and immunotherapy. Thankfully she is able to manage most of her symptoms but still has a dry mouth at times. No other complaints are verbalized.  PAST MEDICAL HISTORY:  Past Medical History:  Diagnosis Date  . met lung ca dx'd 09/2017   neck LN and brain 2020  . Rheumatoid arthritis ([place])     PAST SURGICAL HISTORY: Past Surgical History:  Procedure Laterality Date  . BRONCHIAL NEEDLE ASPIRATION BIOPSY  11/09/2017   Procedure: BRONCHIAL NEEDLE ASPIRATION BIOPSIES;  Surgeon: Marshell Garfinkel, MD;  Location: [facility];  Service: Cardiopulmonary;;  . ENDOBRONCHIAL ULTRASOUND Bilateral 11/09/2017   Procedure: ENDOBRONCHIAL ULTRASOUND;  Surgeon: Marshell Garfinkel, MD;  Location: [facility];  Service: Cardiopulmonary;  Laterality: Bilateral;  . NASAL SINUS SURGERY    . NASAL SINUS SURGERY    . TUBAL LIGATION    . VIDEO BRONCHOSCOPY  11/09/2017   Procedure: VIDEO BRONCHOSCOPY;  Surgeon: Marshell Garfinkel, MD;  Location: [facility];  Service: Cardiopulmonary;;    PAST SOCIAL HISTORY:  Social History   Socioeconomic History  . Marital status: Divorced  Spouse name: Not on file  . Number of children: Not on file  . Years of education: Not on file  . Highest education level: Not on file  Occupational History  . Not on file  Tobacco Use  . Smoking status: Former Smoker    Packs/day: 0.50    Years: 45.00     Pack years: 22.50    Types: Cigarettes    Quit date: 09/29/2017    Years since quitting: 2.7  . Smokeless tobacco: Never Used  Vaping Use  . Vaping Use: Never used  Substance and Sexual Activity  . Alcohol use: Not Currently  . Drug use: No  . Sexual activity: Not Currently  Other Topics Concern  . Not on file  Social History Narrative  . Not on file   Social Determinants of Health   Financial Resource Strain: Not on file  Food Insecurity: Not on file  Transportation Needs: Not on file  Physical Activity: Not on file  Stress: Not on file  Social Connections: Not on file  Intimate Partner Violence: Not on file  The patient is divorced.  She lives in [place] and her 33 year old granddaughter is also at home with her.  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  PAST FAMILY HISTORY: Family History  Problem Relation Age of Onset  . Stroke Mother   . Alzheimer's disease Mother   . Heart disease Mother   . Emphysema Father   . Hypertension Brother   . Heart attack Maternal Aunt   . Heart failure Maternal Grandmother   . Hypertension Paternal Grandmother     MEDICATIONS  Current Outpatient Medications  Medication Sig Dispense Refill  . diclofenac sodium (VOLTAREN) 1 % GEL Apply 2 g topically 4 (four) times daily.    . Fluocinolone Acetonide Scalp 0.01 % OIL     . folic acid (FOLVITE) 1 MG tablet TAKE 1 TABLET(1 MG) BY MOUTH DAILY 30 tablet 4  . hydroxychloroquine (PLAQUENIL) 200 MG tablet TAKE 1 TABLET BY MOUTH TWICE DAILY MONDAY-FRIDAY 120 tablet 0  . naproxen sodium (ALEVE) 220 MG tablet Take 220 mg by mouth daily as needed (PAIN).    Marland Kitchen omeprazole (PRILOSEC) 20 MG capsule Take 20 mg by mouth daily.    Marland Kitchen Phenylephrine-DM-GG-APAP (DELSYM COUGH/COLD DAYTIME PO) Take by mouth as needed.     . prochlorperazine (COMPAZINE) 10 MG tablet TAKE 1 TABLET(10 MG) BY MOUTH EVERY 6 HOURS AS NEEDED FOR NAUSEA OR VOMITING 30 tablet 0  . pseudoephedrine-guaifenesin (MUCINEX  D) 60-600 MG 12 hr tablet Take 1 tablet by mouth as needed.     Marland Kitchen VITAMIN D PO Take by mouth.     No current facility-administered medications for this encounter.    ALLERGIES: No Known Allergies  PHYSICAL EXAM:  Unable to assess due to encounter type.   IMPRESSION/PLAN: 1. Progressive MetastaticStage IIIA, cT3N1-2M0 NSCLC, adenocarcinoma of the right lower lobe with brain metastases. The patient is currently in the infusion suite getting ready for her next cycle of treatment. She continues to do well systemically as well as radiographically in the brain. We will  continue with MRI in 4 month's time for surveillance and as we near her 2 year anniversary to radiosurgery will then start to move her appointment intervals to 6 months with MRIs.Marland Kitchen She will also continue chemotherapy and immunotherapy with Dr. Julien Nordmann. She is in agreement with this plan.   Given current concerns for patient exposure during the COVID-19 pandemic, this encounter  was conducted via telephone.  The patient has given verbal consent for this type of encounter. The time spent during this encounter was 30 minutes and 50% of that time was spent in preparation, discussion, and in the coordination of her care. The attendants for this meeting include Shona Simpson, [facility] and Farrel Gobble.  During the encounter, Shona Simpson [facility] was located remotely at home. LATAYVIA MANDUJANO  was located in the infusion suite of the [facility].    Carola Rhine, PAC

## 2020-07-02 NOTE — Progress Notes (Signed)
Per Cassie Helligoetter PA, ok for treatment today with heart rate 102-105. Pt. denies complaints of chest pain, dizziness, and no shortness of breath noted.

## 2020-07-02 NOTE — Patient Instructions (Signed)
[facility] Discharge Instructions for Patients Receiving Chemotherapy  Today you received the following immunotherapy agent: Pembrolizumab (Keytruda) and chemotherapy agent: Pemetrexed (Alimta).  To help prevent nausea and vomiting after your treatment, we encourage you to take your nausea medication as directed by your MD.   If you develop nausea and vomiting that is not controlled by your nausea medication, call the clinic.   BELOW ARE SYMPTOMS THAT SHOULD BE REPORTED IMMEDIATELY:  *FEVER GREATER THAN 100.5 F  *CHILLS WITH OR WITHOUT FEVER  NAUSEA AND VOMITING THAT IS NOT CONTROLLED WITH YOUR NAUSEA MEDICATION  *UNUSUAL SHORTNESS OF BREATH  *UNUSUAL BRUISING OR BLEEDING  TENDERNESS IN MOUTH AND THROAT WITH OR WITHOUT PRESENCE OF ULCERS  *URINARY PROBLEMS  *BOWEL PROBLEMS  UNUSUAL RASH Items with * indicate a potential emergency and should be followed up as soon as possible.  Feel free to call the clinic should you have any questions or concerns. The clinic phone number is [phone].  Please show the [facility] at check-in to the Emergency Department and triage nurse.

## 2020-07-03 ENCOUNTER — Telehealth: Payer: Self-pay | Admitting: Physician Assistant

## 2020-07-03 DIAGNOSIS — L218 Other seborrheic dermatitis: Secondary | ICD-10-CM | POA: Diagnosis not present

## 2020-07-03 DIAGNOSIS — L80 Vitiligo: Secondary | ICD-10-CM | POA: Diagnosis not present

## 2020-07-03 NOTE — Telephone Encounter (Signed)
Scheduled per los. Called and left msg. Mailed printout  °

## 2020-07-04 ENCOUNTER — Other Ambulatory Visit: Payer: Self-pay | Admitting: Internal Medicine

## 2020-07-11 DIAGNOSIS — C3490 Malignant neoplasm of unspecified part of unspecified bronchus or lung: Secondary | ICD-10-CM | POA: Diagnosis not present

## 2020-07-20 ENCOUNTER — Ambulatory Visit (HOSPITAL_COMMUNITY)
Admission: RE | Admit: 2020-07-20 | Discharge: 2020-07-20 | Disposition: A | Discharge status: Home / Self Care | Payer: Medicare PPO | Source: Ambulatory Visit | Attending: Physician Assistant | Admitting: Physician Assistant

## 2020-07-20 ENCOUNTER — Other Ambulatory Visit: Payer: Self-pay

## 2020-07-20 ENCOUNTER — Encounter (HOSPITAL_COMMUNITY): Payer: Self-pay

## 2020-07-20 DIAGNOSIS — Z85118 Personal history of other malignant neoplasm of bronchus and lung: Secondary | ICD-10-CM | POA: Diagnosis not present

## 2020-07-20 DIAGNOSIS — C3491 Malignant neoplasm of unspecified part of right bronchus or lung: Secondary | ICD-10-CM | POA: Diagnosis not present

## 2020-07-20 DIAGNOSIS — J432 Centrilobular emphysema: Secondary | ICD-10-CM | POA: Diagnosis not present

## 2020-07-20 DIAGNOSIS — J929 Pleural plaque without asbestos: Secondary | ICD-10-CM | POA: Diagnosis not present

## 2020-07-20 DIAGNOSIS — I2510 Atherosclerotic heart disease of native coronary artery without angina pectoris: Secondary | ICD-10-CM | POA: Diagnosis not present

## 2020-07-20 MED ORDER — IOHEXOL 300 MG/ML  SOLN
75.0000 mL | Freq: Once | INTRAMUSCULAR | Status: AC | PRN
  Administered 2020-07-20: 75 mL via INTRAVENOUS

## 2020-07-23 ENCOUNTER — Inpatient Hospital Stay: Payer: Medicare PPO

## 2020-07-23 ENCOUNTER — Inpatient Hospital Stay: Payer: Medicare PPO | Admitting: Internal Medicine

## 2020-07-23 ENCOUNTER — Other Ambulatory Visit: Payer: Self-pay

## 2020-07-23 ENCOUNTER — Encounter: Payer: Self-pay | Admitting: Internal Medicine

## 2020-07-23 VITALS — BP 137/75 | HR 111 | Temp 97.9°F | Resp 18 | Ht 60.0 in | Wt 117.6 lb

## 2020-07-23 DIAGNOSIS — C3491 Malignant neoplasm of unspecified part of right bronchus or lung: Secondary | ICD-10-CM

## 2020-07-23 DIAGNOSIS — M069 Rheumatoid arthritis, unspecified: Secondary | ICD-10-CM | POA: Diagnosis not present

## 2020-07-23 DIAGNOSIS — Z79899 Other long term (current) drug therapy: Secondary | ICD-10-CM | POA: Diagnosis not present

## 2020-07-23 DIAGNOSIS — C3431 Malignant neoplasm of lower lobe, right bronchus or lung: Secondary | ICD-10-CM | POA: Diagnosis not present

## 2020-07-23 DIAGNOSIS — D649 Anemia, unspecified: Secondary | ICD-10-CM | POA: Diagnosis not present

## 2020-07-23 DIAGNOSIS — Z5112 Encounter for antineoplastic immunotherapy: Secondary | ICD-10-CM

## 2020-07-23 DIAGNOSIS — C770 Secondary and unspecified malignant neoplasm of lymph nodes of head, face and neck: Secondary | ICD-10-CM | POA: Diagnosis not present

## 2020-07-23 DIAGNOSIS — Z5111 Encounter for antineoplastic chemotherapy: Secondary | ICD-10-CM | POA: Diagnosis not present

## 2020-07-23 DIAGNOSIS — R682 Dry mouth, unspecified: Secondary | ICD-10-CM | POA: Diagnosis not present

## 2020-07-23 DIAGNOSIS — C7931 Secondary malignant neoplasm of brain: Secondary | ICD-10-CM | POA: Diagnosis not present

## 2020-07-23 LAB — CMP (CANCER CENTER ONLY)
ALT: 13 U/L (ref 0–44)
AST: 26 U/L (ref 15–41)
Albumin: 3.2 g/dL — ABNORMAL LOW (ref 3.5–5.0)
Alkaline Phosphatase: 75 U/L (ref 38–126)
Anion gap: 13 (ref 5–15)
BUN: 10 mg/dL (ref 8–23)
CO2: 22 mmol/L (ref 22–32)
Calcium: 9.7 mg/dL (ref 8.9–10.3)
Chloride: 106 mmol/L (ref 98–111)
Creatinine: 0.97 mg/dL (ref 0.44–1.00)
GFR, Estimated: >60 mL/min (ref >60)
Glucose, Bld: 77 mg/dL (ref 70–99)
Potassium: 3.5 mmol/L (ref 3.5–5.1)
Sodium: 141 mmol/L (ref 135–145)
Total Bilirubin: <0.2 mg/dL — ABNORMAL LOW (ref 0.3–1.2)
Total Protein: 7.9 g/dL (ref 6.5–8.1)

## 2020-07-23 LAB — CBC WITH DIFFERENTIAL (CANCER CENTER ONLY)
Abs Immature Granulocytes: 0.03 10*3/uL (ref 0.00–0.07)
Basophils Absolute: 0 10*3/uL (ref 0.0–0.1)
Basophils Relative: 1 %
Eosinophils Absolute: 0 10*3/uL (ref 0.0–0.5)
Eosinophils Relative: 0 %
HCT: 32 % — ABNORMAL LOW (ref 36.0–46.0)
Hemoglobin: 10.1 g/dL — ABNORMAL LOW (ref 12.0–15.0)
Immature Granulocytes: 0 %
Lymphocytes Relative: 11 %
Lymphs Abs: 1 10*3/uL (ref 0.7–4.0)
MCH: 33.2 pg (ref 26.0–34.0)
MCHC: 31.6 g/dL (ref 30.0–36.0)
MCV: 105.3 fL — ABNORMAL HIGH (ref 80.0–100.0)
Monocytes Absolute: 0.7 10*3/uL (ref 0.1–1.0)
Monocytes Relative: 8 %
Neutro Abs: 6.8 10*3/uL (ref 1.7–7.7)
Neutrophils Relative %: 80 %
Platelet Count: 456 10*3/uL — ABNORMAL HIGH (ref 150–400)
RBC: 3.04 MIL/uL — ABNORMAL LOW (ref 3.87–5.11)
RDW: 14.5 % (ref 11.5–15.5)
WBC Count: 8.6 10*3/uL (ref 4.0–10.5)
nRBC: 0 % (ref 0.0–0.2)

## 2020-07-23 LAB — TSH: TSH: 2.627 u[IU]/mL (ref 0.308–3.960)

## 2020-07-23 MED ORDER — PROCHLORPERAZINE MALEATE 10 MG PO TABS
ORAL_TABLET | ORAL | Status: AC
  Filled 2020-07-23: qty 1

## 2020-07-23 MED ORDER — SODIUM CHLORIDE 0.9 % IV SOLN
200.0000 mg | Freq: Once | INTRAVENOUS | Status: AC
  Administered 2020-07-23: 200 mg via INTRAVENOUS
  Filled 2020-07-23: qty 8

## 2020-07-23 MED ORDER — PROCHLORPERAZINE MALEATE 10 MG PO TABS
10.0000 mg | ORAL_TABLET | Freq: Once | ORAL | Status: AC
  Administered 2020-07-23: 10 mg via ORAL

## 2020-07-23 MED ORDER — SODIUM CHLORIDE 0.9 % IV SOLN
500.0000 mg/m2 | Freq: Once | INTRAVENOUS | Status: AC
  Administered 2020-07-23: 800 mg via INTRAVENOUS
  Filled 2020-07-23: qty 12

## 2020-07-23 MED ORDER — CYANOCOBALAMIN 1000 MCG/ML IJ SOLN
1000.0000 ug | Freq: Once | INTRAMUSCULAR | Status: AC
  Administered 2020-07-23: 1000 ug via INTRAMUSCULAR

## 2020-07-23 MED ORDER — CYANOCOBALAMIN 1000 MCG/ML IJ SOLN
INTRAMUSCULAR | Status: AC
  Filled 2020-07-23: qty 1

## 2020-07-23 MED ORDER — SODIUM CHLORIDE 0.9 % IV SOLN
Freq: Once | INTRAVENOUS | Status: AC
  Filled 2020-07-23: qty 250

## 2020-07-23 NOTE — Patient Instructions (Signed)
[facility] Discharge Instructions for Patients Receiving Chemotherapy  Today you received the following immunotherapy agent: Pembrolizumab (Keytruda) and chemotherapy agent: Pemetrexed (Alimta).  To help prevent nausea and vomiting after your treatment, we encourage you to take your nausea medication as directed by your MD.   If you develop nausea and vomiting that is not controlled by your nausea medication, call the clinic.   BELOW ARE SYMPTOMS THAT SHOULD BE REPORTED IMMEDIATELY:  *FEVER GREATER THAN 100.5 F  *CHILLS WITH OR WITHOUT FEVER  NAUSEA AND VOMITING THAT IS NOT CONTROLLED WITH YOUR NAUSEA MEDICATION  *UNUSUAL SHORTNESS OF BREATH  *UNUSUAL BRUISING OR BLEEDING  TENDERNESS IN MOUTH AND THROAT WITH OR WITHOUT PRESENCE OF ULCERS  *URINARY PROBLEMS  *BOWEL PROBLEMS  UNUSUAL RASH Items with * indicate a potential emergency and should be followed up as soon as possible.  Feel free to call the clinic should you have any questions or concerns. The clinic phone number is [phone].  Please show the [facility] at check-in to the Emergency Department and triage nurse.

## 2020-07-23 NOTE — Progress Notes (Signed)
Per Dr Julien Nordmann ,it is ok to treat pt today with Keytruda and Alimta and heart rate of 111.

## 2020-07-23 NOTE — Progress Notes (Signed)
[facility] [phone]   [phone]  OFFICE PROGRESS NOTE  [place] [address]  DIAGNOSIS: Metastatic non-small cell lung cancer initially diagnosed as stage IIIA (T3, N1/N2, M0) non-small cell lung cancer, adenocarcinoma presented with large right lower lobe lung mass with extension to the right hilum and subcarinal area diagnosed in July 2019.  She has brain metastasis in October 2020.  Biomarker Findings Tumor Mutational Burden - TMB-Intermediate (6 Muts/Mb) Microsatellite status - MS-Stable Genomic Findings For a complete list of the genes assayed, please refer to the Appendix. NRAS Q61R ARAF amplification STK11 G56W KRAS G13D MYCN amplification MCL1 amplification NKX2-1 amplification - equivocal? TP53 G245V 7 Disease relevant genes with no reportable alterations: EGFR, ALK, BRAF, MET, ERBB2, RET, ROS1   PRIOR THERAPY:  1) Course of concurrent chemoradiation with weekly carboplatin for AUC of 2 and paclitaxel 45 mg/M2.  Status post 7 cycles.  Last dose was giving 01/11/2018. 2) Consolidation treatment with immunotherapy with Imfinzi (Durvalumab) 10 mg/KG every 2 weeks.  First dose February 09, 2018.  Status post 19 cycles. 3) status post stereotactic body radiotherapy to the enlarging right supraclavicular lymphadenopathy under the care of Dr. Lisbeth Renshaw. 4) SRS to multiple brain metastasis under the care of Dr. Lisbeth Renshaw.  CURRENT THERAPY: Systemic chemotherapy with carboplatin for AUC of 5, Alimta 500 mg/M2 and Keytruda 200 mg IV every 3 weeks.  First dose 08/16/2019.  Status post 16 cycles.  Starting from cycle #5 the patient is on maintenance treatment with Alimta and Keytruda every 3 weeks.  INTERVAL HISTORY: Susan Holder 65 y.o. female returns to the clinic today for follow-up visit.  The patient is feeling fine today with no concerning complaints.  She denied having any current chest  pain, shortness breath, cough or hemoptysis.  She denied having any fever or chills.  She has no nausea, vomiting, diarrhea or constipation.  She has no headache or visual changes.  She denied having any weight loss or night sweats.  She continues to tolerate her treatment with maintenance Alimta and Keytruda fairly well.  The patient had repeat CT scan of the chest, abdomen pelvis performed recently and she is here for evaluation and discussion of her risk her results.  MEDICAL HISTORY: Past Medical History:  Diagnosis Date  . met lung ca dx'd 09/2017   neck LN and brain 2020  . Rheumatoid arthritis ([place])     ALLERGIES:  has No Known Allergies.  MEDICATIONS:  Current Outpatient Medications  Medication Sig Dispense Refill  . diclofenac sodium (VOLTAREN) 1 % GEL Apply 2 g topically 4 (four) times daily.    . Fluocinolone Acetonide Scalp 0.01 % OIL     . folic acid (FOLVITE) 1 MG tablet TAKE 1 TABLET(1 MG) BY MOUTH DAILY 30 tablet 4  . hydroxychloroquine (PLAQUENIL) 200 MG tablet TAKE 1 TABLET BY MOUTH TWICE DAILY MONDAY-FRIDAY 120 tablet 0  . naproxen sodium (ALEVE) 220 MG tablet Take 220 mg by mouth daily as needed (PAIN).    Marland Kitchen omeprazole (PRILOSEC) 20 MG capsule Take 20 mg by mouth daily.    . ondansetron (ZOFRAN) 8 MG tablet Take 1 tablet (8 mg total) by mouth every 8 (eight) hours as needed for nausea or vomiting. 30 tablet 0  . Phenylephrine-DM-GG-APAP (DELSYM COUGH/COLD DAYTIME PO) Take by mouth as needed.     . prochlorperazine (COMPAZINE) 10 MG tablet TAKE 1 TABLET(10 MG) BY MOUTH EVERY 6  HOURS AS NEEDED FOR NAUSEA OR VOMITING 30 tablet 0  . pseudoephedrine-guaifenesin (MUCINEX D) 60-600 MG 12 hr tablet Take 1 tablet by mouth as needed.     Marland Kitchen VITAMIN D PO Take by mouth.     No current facility-administered medications for this visit.    SURGICAL HISTORY:  Past Surgical History:  Procedure Laterality Date  . BRONCHIAL NEEDLE ASPIRATION BIOPSY  11/09/2017   Procedure: BRONCHIAL  NEEDLE ASPIRATION BIOPSIES;  Surgeon: Marshell Garfinkel, MD;  Location: [facility];  Service: Cardiopulmonary;;  . ENDOBRONCHIAL ULTRASOUND Bilateral 11/09/2017   Procedure: ENDOBRONCHIAL ULTRASOUND;  Surgeon: Marshell Garfinkel, MD;  Location: [facility];  Service: Cardiopulmonary;  Laterality: Bilateral;  . NASAL SINUS SURGERY    . NASAL SINUS SURGERY    . TUBAL LIGATION    . VIDEO BRONCHOSCOPY  11/09/2017   Procedure: VIDEO BRONCHOSCOPY;  Surgeon: Marshell Garfinkel, MD;  Location: [facility];  Service: Cardiopulmonary;;    REVIEW OF SYSTEMS:  Constitutional: positive for fatigue Eyes: negative Ears, nose, mouth, throat, and face: negative Respiratory: negative Cardiovascular: negative Gastrointestinal: negative Genitourinary:negative Integument/breast: negative Hematologic/lymphatic: negative Musculoskeletal:negative Neurological: negative Behavioral/Psych: negative Endocrine: negative Allergic/Immunologic: negative   PHYSICAL EXAMINATION: General appearance: alert, cooperative, fatigued and no distress Head: Normocephalic, without obvious abnormality, atraumatic Neck: no adenopathy, no JVD, supple, symmetrical, trachea midline and thyroid not enlarged, symmetric, no tenderness/mass/nodules Lymph nodes: Cervical, supraclavicular, and axillary nodes normal. Resp: clear to auscultation bilaterally Back: symmetric, no curvature. ROM normal. No CVA tenderness. Cardio: regular rate and rhythm, S1, S2 normal, no murmur, click, rub or gallop GI: soft, non-tender; bowel sounds normal; no masses,  no organomegaly Extremities: extremities normal, atraumatic, no cyanosis or edema Neurologic: Alert and oriented X 3, normal strength and tone. Normal symmetric reflexes. Normal coordination and gait  ECOG PERFORMANCE STATUS: 1 - Symptomatic but completely ambulatory  Blood pressure 137/75, pulse (!) 111, temperature 97.9 F (36.6 C), temperature source Tympanic, resp. rate 18, height 5'  (1.524 m), weight 117 lb 9.6 oz (53.3 kg), last menstrual period 04/28/2000, SpO2 100 %.  LABORATORY DATA: Lab Results  Component Value Date   WBC 8.6 07/23/2020   HGB 10.1 (L) 07/23/2020   HCT 32.0 (L) 07/23/2020   MCV 105.3 (H) 07/23/2020   PLT 456 (H) 07/23/2020      Chemistry      Component Value Date/Time   NA 138 07/02/2020 0857   K 3.8 07/02/2020 0857   CL 106 07/02/2020 0857   CO2 25 07/02/2020 0857   BUN 12 07/02/2020 0857   CREATININE 0.97 07/02/2020 0857   CREATININE 0.80 07/24/2017 1438      Component Value Date/Time   CALCIUM 9.9 07/02/2020 0857   ALKPHOS 74 07/02/2020 0857   AST 36 07/02/2020 0857   ALT 21 07/02/2020 0857   BILITOT <0.2 (L) 07/02/2020 0857       RADIOGRAPHIC STUDIES: CT Chest W Contrast  Result Date: 07/21/2020 CLINICAL DATA:  65 year old female with history of non-small cell lung cancer with metastatic disease. Evaluate response to therapy. EXAM: CT CHEST WITH CONTRAST TECHNIQUE: Multidetector CT imaging of the chest was performed during intravenous contrast administration. CONTRAST:  36m OMNIPAQUE IOHEXOL 300 MG/ML  SOLN COMPARISON:  Chest CT 05/16/2020. FINDINGS: Cardiovascular: Heart size is normal. Small amount of pericardial fluid and/or thickening, slightly increased compared to the prior study, but unlikely to be of any hemodynamic significance at this time. No associated pericardial calcification. There is aortic atherosclerosis, as well as atherosclerosis of the great vessels of  the mediastinum and the coronary arteries, including calcified atherosclerotic plaque in the left anterior descending and right coronary arteries. Mediastinum/Nodes: No discrete pathologically enlarged mediastinal or hilar lymph nodes are confidently identified, although there is some amorphous soft tissue in the subcarinal nodal station which appears more prominent than the prior examination currently measuring 3.0 x 1.7 cm which could represent a matted  conglomeration of lymph nodes. Esophagus is unremarkable in appearance. No axillary lymphadenopathy. Lungs/Pleura: Chronic mass-like architectural distortion in the central aspect of the right lung very similar to prior examinations, most compatible with chronic postradiation mass-like fibrosis. 4 mm ground-glass attenuation nodule in the left lower lobe (axial image 73 of series 7), stable compared to the prior examination. No other new suspicious appearing pulmonary nodules or masses are noted. No acute consolidative airspace disease. No pleural effusions. Mild diffuse bronchial wall thickening with mild centrilobular and paraseptal emphysema. Upper Abdomen: Unremarkable. Musculoskeletal: There are no aggressive appearing lytic or blastic lesions noted in the visualized portions of the skeleton. IMPRESSION: 1. Stable post treatment related changes of radiation fibrosis in the central aspect of the right lung without definitive evidence of local recurrence of disease. Soft tissue in the subcarinal station slightly more prominent than the prior examination. Close attention on follow-up studies is recommended to exclude the possibility of lymphadenopathy in this region. 2. Slight increase in size of small pericardial effusion, unlikely to be of hemodynamic significance at this time. No associated pericardial calcification. 3. Aortic atherosclerosis, in addition to two vessel coronary artery disease. Please note that although the presence of coronary artery calcium documents the presence of coronary artery disease, the severity of this disease and any potential stenosis cannot be assessed on this non-gated CT examination. Assessment for potential risk factor modification, dietary therapy or pharmacologic therapy may be warranted, if clinically indicated. 4. Mild diffuse bronchial wall thickening with mild centrilobular and paraseptal emphysema; imaging findings suggestive of underlying COPD. Aortic Atherosclerosis  (ICD10-I70.0) and Emphysema (ICD10-J43.9). Electronically Signed   By: Vinnie Langton M.D.   On: 07/21/2020 10:54   MR Brain W Wo Contrast  Result Date: 06/29/2020 CLINICAL DATA:  Follow-up metastatic lung cancer. EXAM: MRI HEAD WITHOUT AND WITH CONTRAST TECHNIQUE: Multiplanar, multiecho pulse sequences of the brain and surrounding structures were obtained without and with intravenous contrast. CONTRAST:  23m MULTIHANCE GADOBENATE DIMEGLUMINE 529 MG/ML IV SOLN COMPARISON:  03/01/2020.  12/08/2019.  09/07/2019. FINDINGS: Brain: Diffusion imaging does not show any acute or subacute infarction or other cause of restricted diffusion. Background pattern of mild chronic small-vessel ischemic change of the white matter is stable. No hydrocephalus or extra-axial collection. No developing edema or mass effect. Right medial cerebellar metastasis axial image 36 series 12 is unchanged or minimally smaller. Peripheral left inferior cerebellar metastasis axial image 26 is unchanged or minimally smaller. Left lateral cerebellar metastasis axial image 32 is slightly smaller. No evidence of new posterior fossa disease. Right inferior frontal metastasis is unchanged at 3 mm, axial image 63. Punctate metastasis left frontal operculum is barely visible, image 90. No additional supra tentorial lesions are discernible. No new or progressive disease. Vascular: Major vessels at the base of the brain show flow. Skull and upper cervical spine: Negative Sinuses/Orbits: Sinuses are clear. Orbits are negative. Left mastoid effusion. Other: None IMPRESSION: Stable or minimally smaller brain metastases without evidence of progression, increased edema or new lesion. Three cerebellar lesions and 2 cerebral hemispheric lesions as above. Electronically Signed   By: MNelson ChimesM.D.   On:  06/29/2020 16:02    ASSESSMENT AND PLAN: This is a very pleasant 65 years old African-American female with metastatic non-small cell lung cancer initially  diagnosed with a stage IIIA non-small cell lung cancer, adenocarcinoma.  She underwent a course of concurrent chemoradiation with weekly carboplatin and paclitaxel status post 7 cycles with partial response.   The patient tolerated this course of treatment well except for mild odynophagia and dysphagia. She completed on consolidation treatment with immunotherapy with Imfinzi (Durvalumab) status post 18 cycles. She also completed SBRT to the right supraclavicular lymphadenopathy. The patient had evidence for multiple brain metastasis in October 2020 and she underwent SRS treatment to this lesion under the care of Dr. Lisbeth Renshaw. The patient had evidence for disease progression and she started systemic chemotherapy with carboplatin, Alimta and Keytruda status post 16 cycles.  Starting from cycle #5 she is on maintenance treatment with Alimta and Keytruda every 3 weeks. The patient continues to tolerate this treatment well with no concerning adverse effects. She had repeat CT scan of the chest, abdomen pelvis performed recently.  I personally and independently reviewed the scans and discussed the results with the patient today. Her scan showed no clear evidence for disease progression. I recommended for her to continue her current maintenance treatment with Alimta and Keytruda as planned and she will proceed with cycle #17 today. The patient was advised to take oral iron tablets for the persistent anemia. For the dry mouth she is currently on biotin and SalivaSure as needed. She will come back for follow-up visit in 3 weeks for evaluation before the next cycle of her treatment. She was advised to call immediately if she has any other concerning symptoms in the interval. The patient voices understanding of current disease status and treatment options and is in agreement with the current care plan. All questions were answered. The patient knows to call the clinic with any problems, questions or concerns. We can  certainly see the patient much sooner if necessary.  Disclaimer: This note was dictated with voice recognition software. Similar sounding words can inadvertently be transcribed and may not be corrected upon review.

## 2020-07-27 ENCOUNTER — Other Ambulatory Visit: Payer: Self-pay | Admitting: Radiation Therapy

## 2020-07-27 DIAGNOSIS — C7949 Secondary malignant neoplasm of other parts of nervous system: Secondary | ICD-10-CM

## 2020-07-27 DIAGNOSIS — C7931 Secondary malignant neoplasm of brain: Secondary | ICD-10-CM

## 2020-08-11 DIAGNOSIS — C3490 Malignant neoplasm of unspecified part of unspecified bronchus or lung: Secondary | ICD-10-CM | POA: Diagnosis not present

## 2020-08-13 ENCOUNTER — Inpatient Hospital Stay: Payer: Medicare PPO | Admitting: Internal Medicine

## 2020-08-13 ENCOUNTER — Other Ambulatory Visit: Payer: Self-pay

## 2020-08-13 ENCOUNTER — Encounter: Payer: Self-pay | Admitting: Internal Medicine

## 2020-08-13 ENCOUNTER — Inpatient Hospital Stay: Payer: Medicare PPO | Attending: Internal Medicine

## 2020-08-13 ENCOUNTER — Inpatient Hospital Stay: Payer: Medicare PPO

## 2020-08-13 VITALS — BP 139/66 | HR 106 | Temp 97.1°F | Resp 20 | Ht 60.0 in | Wt 117.3 lb

## 2020-08-13 DIAGNOSIS — C3431 Malignant neoplasm of lower lobe, right bronchus or lung: Secondary | ICD-10-CM | POA: Diagnosis not present

## 2020-08-13 DIAGNOSIS — M069 Rheumatoid arthritis, unspecified: Secondary | ICD-10-CM | POA: Diagnosis not present

## 2020-08-13 DIAGNOSIS — Z5111 Encounter for antineoplastic chemotherapy: Secondary | ICD-10-CM

## 2020-08-13 DIAGNOSIS — Z79899 Other long term (current) drug therapy: Secondary | ICD-10-CM | POA: Insufficient documentation

## 2020-08-13 DIAGNOSIS — R682 Dry mouth, unspecified: Secondary | ICD-10-CM | POA: Insufficient documentation

## 2020-08-13 DIAGNOSIS — C3491 Malignant neoplasm of unspecified part of right bronchus or lung: Secondary | ICD-10-CM

## 2020-08-13 DIAGNOSIS — Z5112 Encounter for antineoplastic immunotherapy: Secondary | ICD-10-CM | POA: Insufficient documentation

## 2020-08-13 DIAGNOSIS — D649 Anemia, unspecified: Secondary | ICD-10-CM | POA: Diagnosis not present

## 2020-08-13 DIAGNOSIS — C7931 Secondary malignant neoplasm of brain: Secondary | ICD-10-CM

## 2020-08-13 LAB — CBC WITH DIFFERENTIAL (CANCER CENTER ONLY)
Abs Immature Granulocytes: 0.03 10*3/uL (ref 0.00–0.07)
Basophils Absolute: 0 10*3/uL (ref 0.0–0.1)
Basophils Relative: 1 %
Eosinophils Absolute: 0 10*3/uL (ref 0.0–0.5)
Eosinophils Relative: 0 %
HCT: 30.6 % — ABNORMAL LOW (ref 36.0–46.0)
Hemoglobin: 9.7 g/dL — ABNORMAL LOW (ref 12.0–15.0)
Immature Granulocytes: 0 %
Lymphocytes Relative: 10 %
Lymphs Abs: 0.9 10*3/uL (ref 0.7–4.0)
MCH: 33.1 pg (ref 26.0–34.0)
MCHC: 31.7 g/dL (ref 30.0–36.0)
MCV: 104.4 fL — ABNORMAL HIGH (ref 80.0–100.0)
Monocytes Absolute: 0.6 10*3/uL (ref 0.1–1.0)
Monocytes Relative: 7 %
Neutro Abs: 7.1 10*3/uL (ref 1.7–7.7)
Neutrophils Relative %: 82 %
Platelet Count: 487 10*3/uL — ABNORMAL HIGH (ref 150–400)
RBC: 2.93 MIL/uL — ABNORMAL LOW (ref 3.87–5.11)
RDW: 14.2 % (ref 11.5–15.5)
WBC Count: 8.7 10*3/uL (ref 4.0–10.5)
nRBC: 0 % (ref 0.0–0.2)

## 2020-08-13 LAB — CMP (CANCER CENTER ONLY)
ALT: 7 U/L (ref 0–44)
AST: 25 U/L (ref 15–41)
Albumin: 3.1 g/dL — ABNORMAL LOW (ref 3.5–5.0)
Alkaline Phosphatase: 73 U/L (ref 38–126)
Anion gap: 13 (ref 5–15)
BUN: 11 mg/dL (ref 8–23)
CO2: 24 mmol/L (ref 22–32)
Calcium: 9.9 mg/dL (ref 8.9–10.3)
Chloride: 106 mmol/L (ref 98–111)
Creatinine: 1.02 mg/dL — ABNORMAL HIGH (ref 0.44–1.00)
GFR, Estimated: >60 mL/min (ref >60)
Glucose, Bld: 82 mg/dL (ref 70–99)
Potassium: 3.3 mmol/L — ABNORMAL LOW (ref 3.5–5.1)
Sodium: 143 mmol/L (ref 135–145)
Total Bilirubin: <0.2 mg/dL — ABNORMAL LOW (ref 0.3–1.2)
Total Protein: 7.8 g/dL (ref 6.5–8.1)

## 2020-08-13 LAB — TSH: TSH: 2.173 u[IU]/mL (ref 0.308–3.960)

## 2020-08-13 MED ORDER — PROCHLORPERAZINE MALEATE 10 MG PO TABS
10.0000 mg | ORAL_TABLET | Freq: Once | ORAL | Status: AC
  Administered 2020-08-13: 10 mg via ORAL

## 2020-08-13 MED ORDER — PROCHLORPERAZINE MALEATE 10 MG PO TABS
ORAL_TABLET | ORAL | Status: AC
  Filled 2020-08-13: qty 1

## 2020-08-13 MED ORDER — SODIUM CHLORIDE 0.9 % IV SOLN
200.0000 mg | Freq: Once | INTRAVENOUS | Status: AC
  Administered 2020-08-13: 200 mg via INTRAVENOUS
  Filled 2020-08-13: qty 8

## 2020-08-13 MED ORDER — SODIUM CHLORIDE 0.9 % IV SOLN
500.0000 mg/m2 | Freq: Once | INTRAVENOUS | Status: AC
  Administered 2020-08-13: 800 mg via INTRAVENOUS
  Filled 2020-08-13: qty 12

## 2020-08-13 MED ORDER — SODIUM CHLORIDE 0.9 % IV SOLN
Freq: Once | INTRAVENOUS | Status: AC
  Filled 2020-08-13: qty 250

## 2020-08-13 NOTE — Patient Instructions (Signed)
[facility] Discharge Instructions for Patients Receiving Chemotherapy  Today you received the following immunotherapy agent: Pembrolizumab (Keytruda) and chemotherapy agent: Pemetrexed (Alimta).  To help prevent nausea and vomiting after your treatment, we encourage you to take your nausea medication as directed by your MD.   If you develop nausea and vomiting that is not controlled by your nausea medication, call the clinic.   BELOW ARE SYMPTOMS THAT SHOULD BE REPORTED IMMEDIATELY:  *FEVER GREATER THAN 100.5 F  *CHILLS WITH OR WITHOUT FEVER  NAUSEA AND VOMITING THAT IS NOT CONTROLLED WITH YOUR NAUSEA MEDICATION  *UNUSUAL SHORTNESS OF BREATH  *UNUSUAL BRUISING OR BLEEDING  TENDERNESS IN MOUTH AND THROAT WITH OR WITHOUT PRESENCE OF ULCERS  *URINARY PROBLEMS  *BOWEL PROBLEMS  UNUSUAL RASH Items with * indicate a potential emergency and should be followed up as soon as possible.  Feel free to call the clinic should you have any questions or concerns. The clinic phone number is [phone].  Please show the [facility] at check-in to the Emergency Department and triage nurse.

## 2020-08-13 NOTE — Progress Notes (Signed)
Per Dr Julien Nordmann it is okay to treat pt today with Alimta and Keytruda and heart rate of 107.

## 2020-08-13 NOTE — Progress Notes (Signed)
[place] Telephone:(336) [phone]   Fax:(336) [phone]  OFFICE PROGRESS NOTE  [place] [address]  DIAGNOSIS: Metastatic non-small cell lung cancer initially diagnosed as stage IIIA (T3, N1/N2, M0) non-small cell lung cancer, adenocarcinoma presented with large right lower lobe lung mass with extension to the right hilum and subcarinal area diagnosed in July 2019.  She has brain metastasis in October 2020.  Biomarker Findings Tumor Mutational Burden - TMB-Intermediate (6 Muts/Mb) Microsatellite status - MS-Stable Genomic Findings For a complete list of the genes assayed, please refer to the Appendix. NRAS Q61R ARAF amplification STK11 G56W KRAS G13D MYCN amplification MCL1 amplification NKX2-1 amplification - equivocal? TP53 G245V 7 Disease relevant genes with no reportable alterations: EGFR, ALK, BRAF, MET, ERBB2, RET, ROS1   PRIOR THERAPY:  1) Course of concurrent chemoradiation with weekly carboplatin for AUC of 2 and paclitaxel 45 mg/M2.  Status post 7 cycles.  Last dose was giving 01/11/2018. 2) Consolidation treatment with immunotherapy with Imfinzi (Durvalumab) 10 mg/KG every 2 weeks.  First dose February 09, 2018.  Status post 19 cycles. 3) status post stereotactic body radiotherapy to the enlarging right supraclavicular lymphadenopathy under the care of Dr. Lisbeth Renshaw. 4) SRS to multiple brain metastasis under the care of Dr. Lisbeth Renshaw.  CURRENT THERAPY: Systemic chemotherapy with carboplatin for AUC of 5, Alimta 500 mg/M2 and Keytruda 200 mg IV every 3 weeks.  First dose 08/16/2019.  Status post 17 cycles.  Starting from cycle #5 the patient is on maintenance treatment with Alimta and Keytruda every 3 weeks.  INTERVAL HISTORY: Susan Holder 65 y.o. female returns to the clinic today for follow-up visit.  The patient has no complaints today except for mild fatigue.  She denied having any chest pain,  shortness of breath, cough or hemoptysis.  She denied having any fever or chills.  She has no nausea, vomiting, diarrhea or constipation.  She has no headache or visual changes.  She has no weight loss or night sweats.  She continues to tolerate her maintenance treatment with Alimta and Keytruda fairly well.  She is here today for evaluation before starting cycle #18.   MEDICAL HISTORY: Past Medical History:  Diagnosis Date  . met lung ca dx'd 09/2017   neck LN and brain 2020  . Rheumatoid arthritis ([place])     ALLERGIES:  has No Known Allergies.  MEDICATIONS:  Current Outpatient Medications  Medication Sig Dispense Refill  . diclofenac sodium (VOLTAREN) 1 % GEL Apply 2 g topically 4 (four) times daily.    . Fluocinolone Acetonide Scalp 0.01 % OIL     . folic acid (FOLVITE) 1 MG tablet TAKE 1 TABLET(1 MG) BY MOUTH DAILY 30 tablet 4  . hydroxychloroquine (PLAQUENIL) 200 MG tablet TAKE 1 TABLET BY MOUTH TWICE DAILY MONDAY-FRIDAY 120 tablet 0  . naproxen sodium (ALEVE) 220 MG tablet Take 220 mg by mouth daily as needed (PAIN).    Marland Kitchen omeprazole (PRILOSEC) 20 MG capsule Take 20 mg by mouth daily.    . ondansetron (ZOFRAN) 8 MG tablet Take 1 tablet (8 mg total) by mouth every 8 (eight) hours as needed for nausea or vomiting. 30 tablet 0  . Phenylephrine-DM-GG-APAP (DELSYM COUGH/COLD DAYTIME PO) Take by mouth as needed.     . prochlorperazine (COMPAZINE) 10 MG tablet TAKE 1 TABLET(10 MG) BY MOUTH EVERY 6 HOURS AS NEEDED FOR NAUSEA OR VOMITING 30 tablet 0  . pseudoephedrine-guaifenesin (MUCINEX D) 60-600  MG 12 hr tablet Take 1 tablet by mouth as needed.     Marland Kitchen VITAMIN D PO Take by mouth.     No current facility-administered medications for this visit.    SURGICAL HISTORY:  Past Surgical History:  Procedure Laterality Date  . BRONCHIAL NEEDLE ASPIRATION BIOPSY  11/09/2017   Procedure: BRONCHIAL NEEDLE ASPIRATION BIOPSIES;  Surgeon: Marshell Garfinkel, MD;  Location: [facility];  Service:  Cardiopulmonary;;  . ENDOBRONCHIAL ULTRASOUND Bilateral 11/09/2017   Procedure: ENDOBRONCHIAL ULTRASOUND;  Surgeon: Marshell Garfinkel, MD;  Location: [facility];  Service: Cardiopulmonary;  Laterality: Bilateral;  . NASAL SINUS SURGERY    . NASAL SINUS SURGERY    . TUBAL LIGATION    . VIDEO BRONCHOSCOPY  11/09/2017   Procedure: VIDEO BRONCHOSCOPY;  Surgeon: Marshell Garfinkel, MD;  Location: [facility];  Service: Cardiopulmonary;;    REVIEW OF SYSTEMS:  A comprehensive review of systems was negative except for: Constitutional: positive for fatigue   PHYSICAL EXAMINATION: General appearance: alert, cooperative, fatigued and no distress Head: Normocephalic, without obvious abnormality, atraumatic Neck: no adenopathy, no JVD, supple, symmetrical, trachea midline and thyroid not enlarged, symmetric, no tenderness/mass/nodules Lymph nodes: Cervical, supraclavicular, and axillary nodes normal. Resp: clear to auscultation bilaterally Back: symmetric, no curvature. ROM normal. No CVA tenderness. Cardio: regular rate and rhythm, S1, S2 normal, no murmur, click, rub or gallop GI: soft, non-tender; bowel sounds normal; no masses,  no organomegaly Extremities: extremities normal, atraumatic, no cyanosis or edema  ECOG PERFORMANCE STATUS: 1 - Symptomatic but completely ambulatory  Blood pressure 139/66, pulse (!) 106, temperature (!) 97.1 F (36.2 C), temperature source Tympanic, resp. rate 20, height 5' (1.524 m), weight 117 lb 4.8 oz (53.2 kg), last menstrual period 04/28/2000, SpO2 100 %.  LABORATORY DATA: Lab Results  Component Value Date   WBC 8.7 08/13/2020   HGB 9.7 (L) 08/13/2020   HCT 30.6 (L) 08/13/2020   MCV 104.4 (H) 08/13/2020   PLT 487 (H) 08/13/2020      Chemistry      Component Value Date/Time   NA 141 07/23/2020 0844   K 3.5 07/23/2020 0844   CL 106 07/23/2020 0844   CO2 22 07/23/2020 0844   BUN 10 07/23/2020 0844   CREATININE 0.97 07/23/2020 0844   CREATININE 0.80  07/24/2017 1438      Component Value Date/Time   CALCIUM 9.7 07/23/2020 0844   ALKPHOS 75 07/23/2020 0844   AST 26 07/23/2020 0844   ALT 13 07/23/2020 0844   BILITOT <0.2 (L) 07/23/2020 0844       RADIOGRAPHIC STUDIES: CT Chest W Contrast  Result Date: 07/21/2020 CLINICAL DATA:  65 year old female with history of non-small cell lung cancer with metastatic disease. Evaluate response to therapy. EXAM: CT CHEST WITH CONTRAST TECHNIQUE: Multidetector CT imaging of the chest was performed during intravenous contrast administration. CONTRAST:  48mL OMNIPAQUE IOHEXOL 300 MG/ML  SOLN COMPARISON:  Chest CT 05/16/2020. FINDINGS: Cardiovascular: Heart size is normal. Small amount of pericardial fluid and/or thickening, slightly increased compared to the prior study, but unlikely to be of any hemodynamic significance at this time. No associated pericardial calcification. There is aortic atherosclerosis, as well as atherosclerosis of the great vessels of the mediastinum and the coronary arteries, including calcified atherosclerotic plaque in the left anterior descending and right coronary arteries. Mediastinum/Nodes: No discrete pathologically enlarged mediastinal or hilar lymph nodes are confidently identified, although there is some amorphous soft tissue in the subcarinal nodal station which appears more prominent than the prior examination  currently measuring 3.0 x 1.7 cm which could represent a matted conglomeration of lymph nodes. Esophagus is unremarkable in appearance. No axillary lymphadenopathy. Lungs/Pleura: Chronic mass-like architectural distortion in the central aspect of the right lung very similar to prior examinations, most compatible with chronic postradiation mass-like fibrosis. 4 mm ground-glass attenuation nodule in the left lower lobe (axial image 73 of series 7), stable compared to the prior examination. No other new suspicious appearing pulmonary nodules or masses are noted. No acute  consolidative airspace disease. No pleural effusions. Mild diffuse bronchial wall thickening with mild centrilobular and paraseptal emphysema. Upper Abdomen: Unremarkable. Musculoskeletal: There are no aggressive appearing lytic or blastic lesions noted in the visualized portions of the skeleton. IMPRESSION: 1. Stable post treatment related changes of radiation fibrosis in the central aspect of the right lung without definitive evidence of local recurrence of disease. Soft tissue in the subcarinal station slightly more prominent than the prior examination. Close attention on follow-up studies is recommended to exclude the possibility of lymphadenopathy in this region. 2. Slight increase in size of small pericardial effusion, unlikely to be of hemodynamic significance at this time. No associated pericardial calcification. 3. Aortic atherosclerosis, in addition to two vessel coronary artery disease. Please note that although the presence of coronary artery calcium documents the presence of coronary artery disease, the severity of this disease and any potential stenosis cannot be assessed on this non-gated CT examination. Assessment for potential risk factor modification, dietary therapy or pharmacologic therapy may be warranted, if clinically indicated. 4. Mild diffuse bronchial wall thickening with mild centrilobular and paraseptal emphysema; imaging findings suggestive of underlying COPD. Aortic Atherosclerosis (ICD10-I70.0) and Emphysema (ICD10-J43.9). Electronically Signed   By: Vinnie Langton M.D.   On: 07/21/2020 10:54    ASSESSMENT AND PLAN: This is a very pleasant 65 years old African-American female with metastatic non-small cell lung cancer initially diagnosed with a stage IIIA non-small cell lung cancer, adenocarcinoma.  She underwent a course of concurrent chemoradiation with weekly carboplatin and paclitaxel status post 7 cycles with partial response.   The patient tolerated this course of treatment  well except for mild odynophagia and dysphagia. She completed on consolidation treatment with immunotherapy with Imfinzi (Durvalumab) status post 18 cycles. She also completed SBRT to the right supraclavicular lymphadenopathy. The patient had evidence for multiple brain metastasis in October 2020 and she underwent SRS treatment to this lesion under the care of Dr. Lisbeth Renshaw. The patient had evidence for disease progression and she started systemic chemotherapy with carboplatin, Alimta and Keytruda status post 17 cycles.  Starting from cycle #5 she is on maintenance treatment with Alimta and Keytruda every 3 weeks. She has been tolerating this treatment well with no concerning adverse effects. I recommended for her to proceed with cycle #18 today as planned. For the anemia she will continue on the oral iron tablet for now. For the dry mouth she is currently on biotin and SalivaSure as needed. She will come back for follow-up visit in 3 weeks for evaluation before the next cycle of her treatment. The patient was advised to call immediately if she has any concerning symptoms in the interval. The patient voices understanding of current disease status and treatment options and is in agreement with the current care plan. All questions were answered. The patient knows to call the clinic with any problems, questions or concerns. We can certainly see the patient much sooner if necessary.  Disclaimer: This note was dictated with voice recognition software. Similar sounding words  can inadvertently be transcribed and may not be corrected upon review.

## 2020-08-14 ENCOUNTER — Other Ambulatory Visit: Payer: Self-pay | Admitting: *Deleted

## 2020-08-14 DIAGNOSIS — M0579 Rheumatoid arthritis with rheumatoid factor of multiple sites without organ or systems involvement: Secondary | ICD-10-CM

## 2020-08-14 MED ORDER — HYDROXYCHLOROQUINE SULFATE 200 MG PO TABS: ORAL_TABLET | ORAL | 0 refills | Status: DC

## 2020-08-14 NOTE — Telephone Encounter (Signed)
RX faxed from [facility] Visit: 05/24/2020 Next Visit: 10/25/2020 Labs: 08/13/2020, RBC 2.93, Hemoglobin 9.7, HCT 30.6, MCV 104.4, Platelet Count 487, Potassium 3.3, Creatinine 1.02, Albumin 3.1, Total Bilirubin <0.2,  Eye exam: 01/19/2020  Current Dose per office note 05/24/2020, Plaquenil 200 mg 1 tablet twice daily Monday through Friday only DX: Rheumatoid arthritis involving multiple sites with positive rheumatoid factor   Last Fill: 05/14/2020  Okay to refill Plaquenil?

## 2020-08-30 ENCOUNTER — Telehealth: Payer: Self-pay | Admitting: Internal Medicine

## 2020-08-30 NOTE — Telephone Encounter (Signed)
Sch per 3/7 los, Patient aware.

## 2020-09-03 ENCOUNTER — Other Ambulatory Visit: Payer: Self-pay

## 2020-09-03 ENCOUNTER — Inpatient Hospital Stay: Payer: Medicare PPO

## 2020-09-03 ENCOUNTER — Inpatient Hospital Stay: Payer: Medicare PPO | Attending: Internal Medicine | Admitting: Internal Medicine

## 2020-09-03 VITALS — BP 143/66 | HR 113 | Temp 98.1°F | Resp 18 | Wt 115.3 lb

## 2020-09-03 VITALS — HR 92

## 2020-09-03 DIAGNOSIS — C770 Secondary and unspecified malignant neoplasm of lymph nodes of head, face and neck: Secondary | ICD-10-CM | POA: Diagnosis not present

## 2020-09-03 DIAGNOSIS — Z79899 Other long term (current) drug therapy: Secondary | ICD-10-CM | POA: Insufficient documentation

## 2020-09-03 DIAGNOSIS — C3431 Malignant neoplasm of lower lobe, right bronchus or lung: Secondary | ICD-10-CM

## 2020-09-03 DIAGNOSIS — D649 Anemia, unspecified: Secondary | ICD-10-CM | POA: Diagnosis not present

## 2020-09-03 DIAGNOSIS — C7931 Secondary malignant neoplasm of brain: Secondary | ICD-10-CM | POA: Diagnosis not present

## 2020-09-03 DIAGNOSIS — Z5111 Encounter for antineoplastic chemotherapy: Secondary | ICD-10-CM | POA: Diagnosis not present

## 2020-09-03 DIAGNOSIS — Z5112 Encounter for antineoplastic immunotherapy: Secondary | ICD-10-CM

## 2020-09-03 DIAGNOSIS — M069 Rheumatoid arthritis, unspecified: Secondary | ICD-10-CM | POA: Insufficient documentation

## 2020-09-03 DIAGNOSIS — C3491 Malignant neoplasm of unspecified part of right bronchus or lung: Secondary | ICD-10-CM

## 2020-09-03 LAB — CBC WITH DIFFERENTIAL (CANCER CENTER ONLY)
Abs Immature Granulocytes: 0.04 10*3/uL (ref 0.00–0.07)
Basophils Absolute: 0 10*3/uL (ref 0.0–0.1)
Basophils Relative: 0 %
Eosinophils Absolute: 0 10*3/uL (ref 0.0–0.5)
Eosinophils Relative: 0 %
HCT: 31.1 % — ABNORMAL LOW (ref 36.0–46.0)
Hemoglobin: 10.1 g/dL — ABNORMAL LOW (ref 12.0–15.0)
Immature Granulocytes: 1 %
Lymphocytes Relative: 12 %
Lymphs Abs: 1.1 10*3/uL (ref 0.7–4.0)
MCH: 33.4 pg (ref 26.0–34.0)
MCHC: 32.5 g/dL (ref 30.0–36.0)
MCV: 103 fL — ABNORMAL HIGH (ref 80.0–100.0)
Monocytes Absolute: 0.8 10*3/uL (ref 0.1–1.0)
Monocytes Relative: 9 %
Neutro Abs: 6.7 10*3/uL (ref 1.7–7.7)
Neutrophils Relative %: 78 %
Platelet Count: 411 10*3/uL — ABNORMAL HIGH (ref 150–400)
RBC: 3.02 MIL/uL — ABNORMAL LOW (ref 3.87–5.11)
RDW: 14.1 % (ref 11.5–15.5)
WBC Count: 8.6 10*3/uL (ref 4.0–10.5)
nRBC: 0 % (ref 0.0–0.2)

## 2020-09-03 LAB — CMP (CANCER CENTER ONLY)
ALT: 12 U/L (ref 0–44)
AST: 28 U/L (ref 15–41)
Albumin: 3.1 g/dL — ABNORMAL LOW (ref 3.5–5.0)
Alkaline Phosphatase: 85 U/L (ref 38–126)
Anion gap: 15 (ref 5–15)
BUN: 16 mg/dL (ref 8–23)
CO2: 23 mmol/L (ref 22–32)
Calcium: 9.9 mg/dL (ref 8.9–10.3)
Chloride: 103 mmol/L (ref 98–111)
Creatinine: 1.09 mg/dL — ABNORMAL HIGH (ref 0.44–1.00)
GFR, Estimated: 56 mL/min — ABNORMAL LOW (ref >60)
Glucose, Bld: 90 mg/dL (ref 70–99)
Potassium: 4.1 mmol/L (ref 3.5–5.1)
Sodium: 141 mmol/L (ref 135–145)
Total Bilirubin: <0.2 mg/dL — ABNORMAL LOW (ref 0.3–1.2)
Total Protein: 8 g/dL (ref 6.5–8.1)

## 2020-09-03 LAB — TSH: TSH: 2.268 u[IU]/mL (ref 0.308–3.960)

## 2020-09-03 MED ORDER — SODIUM CHLORIDE 0.9 % IV SOLN
Freq: Once | INTRAVENOUS | Status: AC
  Filled 2020-09-03: qty 250

## 2020-09-03 MED ORDER — SODIUM CHLORIDE 0.9 % IV SOLN
200.0000 mg | Freq: Once | INTRAVENOUS | Status: AC
  Administered 2020-09-03: 200 mg via INTRAVENOUS
  Filled 2020-09-03: qty 8

## 2020-09-03 MED ORDER — SODIUM CHLORIDE 0.9 % IV SOLN
500.0000 mg/m2 | Freq: Once | INTRAVENOUS | Status: AC
  Administered 2020-09-03: 800 mg via INTRAVENOUS
  Filled 2020-09-03: qty 20

## 2020-09-03 MED ORDER — PROCHLORPERAZINE MALEATE 10 MG PO TABS
ORAL_TABLET | ORAL | Status: AC
  Filled 2020-09-03: qty 1

## 2020-09-03 MED ORDER — PROCHLORPERAZINE MALEATE 10 MG PO TABS
10.0000 mg | ORAL_TABLET | Freq: Once | ORAL | Status: AC
  Administered 2020-09-03: 10 mg via ORAL

## 2020-09-03 NOTE — Progress Notes (Signed)
[place] Telephone:([phone]   Fax:(336) [phone]  OFFICE PROGRESS NOTE  [place] [address]  DIAGNOSIS: Metastatic non-small cell lung cancer initially diagnosed as stage IIIA (T3, N1/N2, M0) non-small cell lung cancer, adenocarcinoma presented with large right lower lobe lung mass with extension to the right hilum and subcarinal area diagnosed in July 2019.  She has brain metastasis in October 2020.  Biomarker Findings Tumor Mutational Burden - TMB-Intermediate (6 Muts/Mb) Microsatellite status - MS-Stable Genomic Findings For a complete list of the genes assayed, please refer to the Appendix. NRAS Q61R ARAF amplification STK11 G56W KRAS G13D MYCN amplification MCL1 amplification NKX2-1 amplification - equivocal? TP53 G245V 7 Disease relevant genes with no reportable alterations: EGFR, ALK, BRAF, MET, ERBB2, RET, ROS1   PRIOR THERAPY:  1) Course of concurrent chemoradiation with weekly carboplatin for AUC of 2 and paclitaxel 45 mg/M2.  Status post 7 cycles.  Last dose was giving 01/11/2018. 2) Consolidation treatment with immunotherapy with Imfinzi (Durvalumab) 10 mg/KG every 2 weeks.  First dose February 09, 2018.  Status post 19 cycles. 3) status post stereotactic body radiotherapy to the enlarging right supraclavicular lymphadenopathy under the care of Dr. Lisbeth Renshaw. 4) SRS to multiple brain metastasis under the care of Dr. Lisbeth Renshaw.  CURRENT THERAPY: Systemic chemotherapy with carboplatin for AUC of 5, Alimta 500 mg/M2 and Keytruda 200 mg IV every 3 weeks.  First dose 08/16/2019.  Status post 18 cycles.  Starting from cycle #5 the patient is on maintenance treatment with Alimta and Keytruda every 3 weeks.  INTERVAL HISTORY: Susan Holder 65 y.o. female returns to the clinic today for follow-up visit.  The patient is feeling fine today with no concerning complaints except for mild fatigue.  She denied  having any current chest pain, shortness of breath, cough or hemoptysis.  She denied having any fever or chills.  She has no nausea, vomiting, diarrhea or constipation.  She has no headache or visual changes.  She continues to tolerate her treatment with maintenance Alimta and Keytruda fairly well.  The patient is here today for evaluation before starting cycle #19.   MEDICAL HISTORY: Past Medical History:  Diagnosis Date  . met lung ca dx'd 09/2017   neck LN and brain 2020  . Rheumatoid arthritis ([place])     ALLERGIES:  has No Known Allergies.  MEDICATIONS:  Current Outpatient Medications  Medication Sig Dispense Refill  . diclofenac sodium (VOLTAREN) 1 % GEL Apply 2 g topically 4 (four) times daily.    . Fluocinolone Acetonide Scalp 0.01 % OIL     . folic acid (FOLVITE) 1 MG tablet TAKE 1 TABLET(1 MG) BY MOUTH DAILY 30 tablet 4  . hydroxychloroquine (PLAQUENIL) 200 MG tablet Take 1 tablet by mouth twice daily Monday through Friday only 120 tablet 0  . naproxen sodium (ALEVE) 220 MG tablet Take 220 mg by mouth daily as needed (PAIN).    Marland Kitchen omeprazole (PRILOSEC) 20 MG capsule Take 20 mg by mouth daily.    . ondansetron (ZOFRAN) 8 MG tablet Take 1 tablet (8 mg total) by mouth every 8 (eight) hours as needed for nausea or vomiting. 30 tablet 0  . Phenylephrine-DM-GG-APAP (DELSYM COUGH/COLD DAYTIME PO) Take by mouth as needed.     . prochlorperazine (COMPAZINE) 10 MG tablet TAKE 1 TABLET(10 MG) BY MOUTH EVERY 6 HOURS AS NEEDED FOR NAUSEA OR VOMITING 30 tablet 0  . pseudoephedrine-guaifenesin (MUCINEX D) 60-600  MG 12 hr tablet Take 1 tablet by mouth as needed.     Marland Kitchen VITAMIN D PO Take by mouth.     No current facility-administered medications for this visit.    SURGICAL HISTORY:  Past Surgical History:  Procedure Laterality Date  . BRONCHIAL NEEDLE ASPIRATION BIOPSY  11/09/2017   Procedure: BRONCHIAL NEEDLE ASPIRATION BIOPSIES;  Surgeon: Marshell Garfinkel, MD;  Location: [facility];   Service: Cardiopulmonary;;  . ENDOBRONCHIAL ULTRASOUND Bilateral 11/09/2017   Procedure: ENDOBRONCHIAL ULTRASOUND;  Surgeon: Marshell Garfinkel, MD;  Location: [facility];  Service: Cardiopulmonary;  Laterality: Bilateral;  . NASAL SINUS SURGERY    . NASAL SINUS SURGERY    . TUBAL LIGATION    . VIDEO BRONCHOSCOPY  11/09/2017   Procedure: VIDEO BRONCHOSCOPY;  Surgeon: Marshell Garfinkel, MD;  Location: [facility];  Service: Cardiopulmonary;;    REVIEW OF SYSTEMS:  A comprehensive review of systems was negative except for: Constitutional: positive for fatigue   PHYSICAL EXAMINATION: General appearance: alert, cooperative, fatigued and no distress Head: Normocephalic, without obvious abnormality, atraumatic Neck: no adenopathy, no JVD, supple, symmetrical, trachea midline and thyroid not enlarged, symmetric, no tenderness/mass/nodules Lymph nodes: Cervical, supraclavicular, and axillary nodes normal. Resp: clear to auscultation bilaterally Back: symmetric, no curvature. ROM normal. No CVA tenderness. Cardio: regular rate and rhythm, S1, S2 normal, no murmur, click, rub or gallop GI: soft, non-tender; bowel sounds normal; no masses,  no organomegaly Extremities: extremities normal, atraumatic, no cyanosis or edema  ECOG PERFORMANCE STATUS: 1 - Symptomatic but completely ambulatory  Blood pressure (!) 143/66, pulse (!) 113, temperature 98.1 F (36.7 C), temperature source Tympanic, resp. rate 18, weight 115 lb 4.8 oz (52.3 kg), last menstrual period 04/28/2000, SpO2 100 %.  LABORATORY DATA: Lab Results  Component Value Date   WBC 8.6 09/03/2020   HGB 10.1 (L) 09/03/2020   HCT 31.1 (L) 09/03/2020   MCV 103.0 (H) 09/03/2020   PLT 411 (H) 09/03/2020      Chemistry      Component Value Date/Time   NA 143 08/13/2020 0845   K 3.3 (L) 08/13/2020 0845   CL 106 08/13/2020 0845   CO2 24 08/13/2020 0845   BUN 11 08/13/2020 0845   CREATININE 1.02 (H) 08/13/2020 0845   CREATININE 0.80  07/24/2017 1438      Component Value Date/Time   CALCIUM 9.9 08/13/2020 0845   ALKPHOS 73 08/13/2020 0845   AST 25 08/13/2020 0845   ALT 7 08/13/2020 0845   BILITOT <0.2 (L) 08/13/2020 0845       RADIOGRAPHIC STUDIES: No results found.  ASSESSMENT AND PLAN: This is a very pleasant 65 years old African-American female with metastatic non-small cell lung cancer initially diagnosed with a stage IIIA non-small cell lung cancer, adenocarcinoma.  She underwent a course of concurrent chemoradiation with weekly carboplatin and paclitaxel status post 7 cycles with partial response.   The patient tolerated this course of treatment well except for mild odynophagia and dysphagia. She completed on consolidation treatment with immunotherapy with Imfinzi (Durvalumab) status post 18 cycles. She also completed SBRT to the right supraclavicular lymphadenopathy. The patient had evidence for multiple brain metastasis in October 2020 and she underwent SRS treatment to this lesion under the care of Dr. Lisbeth Renshaw. The patient had evidence for disease progression and she started systemic chemotherapy with carboplatin, Alimta and Keytruda status post 18 cycles.  Starting from cycle #5 she is on maintenance treatment with Alimta and Keytruda every 3 weeks. The patient continues to tolerate  this treatment well with no concerning adverse effect except for mild fatigue. I recommended for her to proceed with cycle #19 today as planned. I will see her back for follow-up visit in 3 weeks for evaluation before starting cycle #20. For the anemia, the patient will continue on the oral iron tablet and vitamin C. She was advised to call immediately if she has any concerning symptoms in the interval. The patient voices understanding of current disease status and treatment options and is in agreement with the current care plan. All questions were answered. The patient knows to call the clinic with any problems, questions or  concerns. We can certainly see the patient much sooner if necessary.  Disclaimer: This note was dictated with voice recognition software. Similar sounding words can inadvertently be transcribed and may not be corrected upon review.

## 2020-09-03 NOTE — Patient Instructions (Signed)
[facility]ICAL [facility]COL[facility]  Discharge Instructions: Thank you for choosing [facility] to provide your oncology and hematology care.   If you have a lab appointment with the Cancer Center, please go directly to the Cancer Center and check in at the registration area.   Wear comfortable clothing and clothing appropriate for easy access to any Portacath or PICC line.   We strive to give you quality time with your provider. You may need to reschedule your appointment if you arrive late (15 or more minutes).  Arriving late affects you and other patients whose appointments are after yours.  Also, if you miss three or more appointments without notifying the office, you may be dismissed from the clinic at the provider's discretion.      For prescription refill requests, have your pharmacy contact our office and allow 72 hours for refills to be completed.    Today you received the following chemotherapy and/or immunotherapy agents: Keytruda/Alimta.      To help prevent nausea and vomiting after your treatment, we encourage you to take your nausea medication as directed.  BELOW ARE SYMPTOMS THAT SHOULD BE REPORTED IMMEDIATELY: *FEVER GREATER THAN 100.4 F (38 C) OR HIGHER *CHILLS OR SWEATING *NAUSEA AND VOMITING THAT IS NOT CONTROLLED WITH YOUR NAUSEA MEDICATION *UNUSUAL SHORTNESS OF BREATH *UNUSUAL BRUISING OR BLEEDING *URINARY PROBLEMS (pain or burning when urinating, or frequent urination) *BOWEL PROBLEMS (unusual diarrhea, constipation, pain near the anus) TENDERNESS IN MOUTH AND THROAT WITH OR WITHOUT PRESENCE OF ULCERS (sore throat, sores in mouth, or a toothache) UNUSUAL RASH, SWELLING OR PAIN  UNUSUAL VAGINAL DISCHARGE OR ITCHING   Items with * indicate a potential emergency and should be followed up as soon as possible or go to the Emergency Department if any problems should occur.  Please show the CHEMOTHERAPY ALERT CARD or IMMUNOTHERAPY ALERT CARD at  check-in to the Emergency Department and triage nurse.  Should you have questions after your visit or need to cancel or reschedule your appointment, please contact [facility]  Dept: [phone]  and follow the prompts.  Office hours are 8:00 a.m. to 4:30 p.m. Monday - Friday. Please note that voicemails left after 4:00 p.m. may not be returned until the following business day.  We are closed weekends and major holidays. You have access to a nurse at all times for urgent questions. Please call the main number to the clinic Dept: [phone] and follow the prompts.   For any non-urgent questions, you may also contact your provider using MyChart. We now offer e-Visits for anyone 18 and older to request care online for non-urgent symptoms. For details visit [email or web address].   Also download the MyChart app! Go to the app store, search "MyChart", open the app, select [facility], and log in with your MyChart username and password.  Due to Covid, a mask is required upon entering the hospital/clinic. If you do not have a mask, one will be given to you upon arrival. For doctor visits, patients may have 1 support person aged 18 or older with them. For treatment visits, patients cannot have anyone with them due to current Covid guidelines and our immunocompromised population.   

## 2020-09-10 DIAGNOSIS — C3490 Malignant neoplasm of unspecified part of unspecified bronchus or lung: Secondary | ICD-10-CM | POA: Diagnosis not present

## 2020-09-18 ENCOUNTER — Other Ambulatory Visit: Payer: Self-pay | Admitting: Internal Medicine

## 2020-09-25 ENCOUNTER — Encounter: Payer: Self-pay | Admitting: Internal Medicine

## 2020-09-25 ENCOUNTER — Other Ambulatory Visit: Payer: Self-pay

## 2020-09-25 ENCOUNTER — Inpatient Hospital Stay (HOSPITAL_BASED_OUTPATIENT_CLINIC_OR_DEPARTMENT_OTHER): Payer: Medicare PPO | Admitting: Internal Medicine

## 2020-09-25 ENCOUNTER — Inpatient Hospital Stay: Payer: Medicare PPO

## 2020-09-25 VITALS — BP 134/62 | HR 112 | Temp 98.4°F | Resp 19 | Wt 113.7 lb

## 2020-09-25 VITALS — HR 96

## 2020-09-25 DIAGNOSIS — Z79899 Other long term (current) drug therapy: Secondary | ICD-10-CM | POA: Diagnosis not present

## 2020-09-25 DIAGNOSIS — C3431 Malignant neoplasm of lower lobe, right bronchus or lung: Secondary | ICD-10-CM

## 2020-09-25 DIAGNOSIS — C3491 Malignant neoplasm of unspecified part of right bronchus or lung: Secondary | ICD-10-CM

## 2020-09-25 DIAGNOSIS — Z5111 Encounter for antineoplastic chemotherapy: Secondary | ICD-10-CM | POA: Diagnosis not present

## 2020-09-25 DIAGNOSIS — C770 Secondary and unspecified malignant neoplasm of lymph nodes of head, face and neck: Secondary | ICD-10-CM | POA: Diagnosis not present

## 2020-09-25 DIAGNOSIS — D649 Anemia, unspecified: Secondary | ICD-10-CM | POA: Diagnosis not present

## 2020-09-25 DIAGNOSIS — M069 Rheumatoid arthritis, unspecified: Secondary | ICD-10-CM | POA: Diagnosis not present

## 2020-09-25 DIAGNOSIS — Z5112 Encounter for antineoplastic immunotherapy: Secondary | ICD-10-CM | POA: Diagnosis not present

## 2020-09-25 DIAGNOSIS — C7931 Secondary malignant neoplasm of brain: Secondary | ICD-10-CM | POA: Diagnosis not present

## 2020-09-25 DIAGNOSIS — C3490 Malignant neoplasm of unspecified part of unspecified bronchus or lung: Secondary | ICD-10-CM

## 2020-09-25 LAB — CBC WITH DIFFERENTIAL (CANCER CENTER ONLY)
Abs Immature Granulocytes: 0.03 10*3/uL (ref 0.00–0.07)
Basophils Absolute: 0 10*3/uL (ref 0.0–0.1)
Basophils Relative: 0 %
Eosinophils Absolute: 0 10*3/uL (ref 0.0–0.5)
Eosinophils Relative: 0 %
HCT: 31.6 % — ABNORMAL LOW (ref 36.0–46.0)
Hemoglobin: 10.1 g/dL — ABNORMAL LOW (ref 12.0–15.0)
Immature Granulocytes: 0 %
Lymphocytes Relative: 11 %
Lymphs Abs: 1 10*3/uL (ref 0.7–4.0)
MCH: 33.1 pg (ref 26.0–34.0)
MCHC: 32 g/dL (ref 30.0–36.0)
MCV: 103.6 fL — ABNORMAL HIGH (ref 80.0–100.0)
Monocytes Absolute: 0.7 10*3/uL (ref 0.1–1.0)
Monocytes Relative: 8 %
Neutro Abs: 7.4 10*3/uL (ref 1.7–7.7)
Neutrophils Relative %: 81 %
Platelet Count: 454 10*3/uL — ABNORMAL HIGH (ref 150–400)
RBC: 3.05 MIL/uL — ABNORMAL LOW (ref 3.87–5.11)
RDW: 13.8 % (ref 11.5–15.5)
WBC Count: 9.2 10*3/uL (ref 4.0–10.5)
nRBC: 0 % (ref 0.0–0.2)

## 2020-09-25 LAB — TSH: TSH: 3.73 u[IU]/mL (ref 0.308–3.960)

## 2020-09-25 LAB — CMP (CANCER CENTER ONLY)
ALT: 9 U/L (ref 0–44)
AST: 27 U/L (ref 15–41)
Albumin: 3 g/dL — ABNORMAL LOW (ref 3.5–5.0)
Alkaline Phosphatase: 77 U/L (ref 38–126)
Anion gap: 13 (ref 5–15)
BUN: 13 mg/dL (ref 8–23)
CO2: 23 mmol/L (ref 22–32)
Calcium: 10.3 mg/dL (ref 8.9–10.3)
Chloride: 106 mmol/L (ref 98–111)
Creatinine: 1.13 mg/dL — ABNORMAL HIGH (ref 0.44–1.00)
GFR, Estimated: 54 mL/min — ABNORMAL LOW (ref >60)
Glucose, Bld: 98 mg/dL (ref 70–99)
Potassium: 3.6 mmol/L (ref 3.5–5.1)
Sodium: 142 mmol/L (ref 135–145)
Total Bilirubin: <0.2 mg/dL — ABNORMAL LOW (ref 0.3–1.2)
Total Protein: 8 g/dL (ref 6.5–8.1)

## 2020-09-25 MED ORDER — SODIUM CHLORIDE 0.9 % IV SOLN
600.0000 mg | Freq: Once | INTRAVENOUS | Status: AC
  Administered 2020-09-25: 600 mg via INTRAVENOUS
  Filled 2020-09-25: qty 20

## 2020-09-25 MED ORDER — PROCHLORPERAZINE MALEATE 10 MG PO TABS
ORAL_TABLET | ORAL | Status: AC
  Filled 2020-09-25: qty 1

## 2020-09-25 MED ORDER — SODIUM CHLORIDE 0.9 % IV SOLN: 500.0000 mg/m2 | Freq: Once | INTRAVENOUS | Status: DC

## 2020-09-25 MED ORDER — PROCHLORPERAZINE MALEATE 10 MG PO TABS
10.0000 mg | ORAL_TABLET | Freq: Once | ORAL | Status: AC
  Administered 2020-09-25: 10 mg via ORAL

## 2020-09-25 MED ORDER — SODIUM CHLORIDE 0.9 % IV SOLN
Freq: Once | INTRAVENOUS | Status: DC
  Filled 2020-09-25: qty 250

## 2020-09-25 MED ORDER — CYANOCOBALAMIN 1000 MCG/ML IJ SOLN
1000.0000 ug | Freq: Once | INTRAMUSCULAR | Status: AC
  Administered 2020-09-25: 1000 ug via INTRAMUSCULAR

## 2020-09-25 MED ORDER — SODIUM CHLORIDE 0.9 % IV SOLN
200.0000 mg | Freq: Once | INTRAVENOUS | Status: AC
  Administered 2020-09-25: 200 mg via INTRAVENOUS
  Filled 2020-09-25: qty 8

## 2020-09-25 MED ORDER — SODIUM CHLORIDE 0.9 % IV SOLN
Freq: Once | INTRAVENOUS | Status: AC
  Filled 2020-09-25: qty 250

## 2020-09-25 MED ORDER — CYANOCOBALAMIN 1000 MCG/ML IJ SOLN
INTRAMUSCULAR | Status: AC
  Filled 2020-09-25: qty 1

## 2020-09-25 NOTE — Progress Notes (Signed)
[facility] [phone]   [phone]  OFFICE PROGRESS NOTE  [place] [address]  DIAGNOSIS: Metastatic non-small cell lung cancer initially diagnosed as stage IIIA (T3, N1/N2, M0) non-small cell lung cancer, adenocarcinoma presented with large right lower lobe lung mass with extension to the right hilum and subcarinal area diagnosed in July 2019.  She has brain metastasis in October 2020.  Biomarker Findings Tumor Mutational Burden - TMB-Intermediate (6 Muts/Mb) Microsatellite status - MS-Stable Genomic Findings For a complete list of the genes assayed, please refer to the Appendix. NRAS Q61R ARAF amplification STK11 G56W KRAS G13D MYCN amplification MCL1 amplification NKX2-1 amplification - equivocal? TP53 G245V 7 Disease relevant genes with no reportable alterations: EGFR, ALK, BRAF, MET, ERBB2, RET, ROS1   PRIOR THERAPY:  1) Course of concurrent chemoradiation with weekly carboplatin for AUC of 2 and paclitaxel 45 mg/M2.  Status post 7 cycles.  Last dose was giving 01/11/2018. 2) Consolidation treatment with immunotherapy with Imfinzi (Durvalumab) 10 mg/KG every 2 weeks.  First dose February 09, 2018.  Status post 19 cycles. 3) status post stereotactic body radiotherapy to the enlarging right supraclavicular lymphadenopathy under the care of Dr. Lisbeth Renshaw. 4) SRS to multiple brain metastasis under the care of Dr. Lisbeth Renshaw.  CURRENT THERAPY: Systemic chemotherapy with carboplatin for AUC of 5, Alimta 500 mg/M2 and Keytruda 200 mg IV every 3 weeks.  First dose 08/16/2019.  Status post 19 cycles.  Starting from cycle #5 the patient is on maintenance treatment with Alimta and Keytruda every 3 weeks.  INTERVAL HISTORY: Susan Holder 65 y.o. female returns to the clinic today for follow-up visit.  The patient is feeling fine today with no concerning complaints.  She is celebrating her 56 birthday  later this week.  She denied having any current chest pain, shortness of breath, cough or hemoptysis.  She denied having any fever or chills.  She has no nausea, vomiting, diarrhea or constipation.  She has no headache or visual changes.  She is here today for evaluation before starting cycle #20 of her treatment.   MEDICAL HISTORY: Past Medical History:  Diagnosis Date  . met lung ca dx'd 09/2017   neck LN and brain 2020  . Rheumatoid arthritis ([place])     ALLERGIES:  has No Known Allergies.  MEDICATIONS:  Current Outpatient Medications  Medication Sig Dispense Refill  . diclofenac sodium (VOLTAREN) 1 % GEL Apply 2 g topically 4 (four) times daily.    . Fluocinolone Acetonide Scalp 0.01 % OIL     . folic acid (FOLVITE) 1 MG tablet TAKE 1 TABLET(1 MG) BY MOUTH DAILY 30 tablet 4  . hydroxychloroquine (PLAQUENIL) 200 MG tablet Take 1 tablet by mouth twice daily Monday through Friday only 120 tablet 0  . naproxen sodium (ALEVE) 220 MG tablet Take 220 mg by mouth daily as needed (PAIN).    Marland Kitchen omeprazole (PRILOSEC) 20 MG capsule Take 20 mg by mouth daily.    . ondansetron (ZOFRAN) 8 MG tablet Take 1 tablet (8 mg total) by mouth every 8 (eight) hours as needed for nausea or vomiting. 30 tablet 0  . Phenylephrine-DM-GG-APAP (DELSYM COUGH/COLD DAYTIME PO) Take by mouth as needed.     . prochlorperazine (COMPAZINE) 10 MG tablet TAKE 1 TABLET(10 MG) BY MOUTH EVERY 6 HOURS AS NEEDED FOR NAUSEA OR VOMITING 30 tablet 0  . pseudoephedrine-guaifenesin (MUCINEX D) 60-600 MG 12 hr tablet Take 1  tablet by mouth as needed.     Marland Kitchen VITAMIN D PO Take by mouth.     No current facility-administered medications for this visit.    SURGICAL HISTORY:  Past Surgical History:  Procedure Laterality Date  . BRONCHIAL NEEDLE ASPIRATION BIOPSY  11/09/2017   Procedure: BRONCHIAL NEEDLE ASPIRATION BIOPSIES;  Surgeon: Marshell Garfinkel, MD;  Location: [facility];  Service: Cardiopulmonary;;  . ENDOBRONCHIAL ULTRASOUND  Bilateral 11/09/2017   Procedure: ENDOBRONCHIAL ULTRASOUND;  Surgeon: Marshell Garfinkel, MD;  Location: [facility];  Service: Cardiopulmonary;  Laterality: Bilateral;  . NASAL SINUS SURGERY    . NASAL SINUS SURGERY    . TUBAL LIGATION    . VIDEO BRONCHOSCOPY  11/09/2017   Procedure: VIDEO BRONCHOSCOPY;  Surgeon: Marshell Garfinkel, MD;  Location: [facility];  Service: Cardiopulmonary;;    REVIEW OF SYSTEMS:  A comprehensive review of systems was negative.   PHYSICAL EXAMINATION: General appearance: alert, cooperative and no distress Head: Normocephalic, without obvious abnormality, atraumatic Neck: no adenopathy, no JVD, supple, symmetrical, trachea midline and thyroid not enlarged, symmetric, no tenderness/mass/nodules Lymph nodes: Cervical, supraclavicular, and axillary nodes normal. Resp: clear to auscultation bilaterally Back: symmetric, no curvature. ROM normal. No CVA tenderness. Cardio: regular rate and rhythm, S1, S2 normal, no murmur, click, rub or gallop GI: soft, non-tender; bowel sounds normal; no masses,  no organomegaly Extremities: extremities normal, atraumatic, no cyanosis or edema  ECOG PERFORMANCE STATUS: 0 - Asymptomatic  Blood pressure 134/62, pulse (!) 112, temperature 98.4 F (36.9 C), temperature source Oral, resp. rate 19, weight 113 lb 11.2 oz (51.6 kg), last menstrual period 04/28/2000, SpO2 99 %.  LABORATORY DATA: Lab Results  Component Value Date   WBC 9.2 09/25/2020   HGB 10.1 (L) 09/25/2020   HCT 31.6 (L) 09/25/2020   MCV 103.6 (H) 09/25/2020   PLT 454 (H) 09/25/2020      Chemistry      Component Value Date/Time   NA 141 09/03/2020 0825   K 4.1 09/03/2020 0825   CL 103 09/03/2020 0825   CO2 23 09/03/2020 0825   BUN 16 09/03/2020 0825   CREATININE 1.09 (H) 09/03/2020 0825   CREATININE 0.80 07/24/2017 1438      Component Value Date/Time   CALCIUM 9.9 09/03/2020 0825   ALKPHOS 85 09/03/2020 0825   AST 28 09/03/2020 0825   ALT 12 09/03/2020  0825   BILITOT <0.2 (L) 09/03/2020 0825       RADIOGRAPHIC STUDIES: No results found.  ASSESSMENT AND PLAN: This is a very pleasant 65 years old African-American female with metastatic non-small cell lung cancer initially diagnosed with a stage IIIA non-small cell lung cancer, adenocarcinoma.  She underwent a course of concurrent chemoradiation with weekly carboplatin and paclitaxel status post 7 cycles with partial response.   The patient tolerated this course of treatment well except for mild odynophagia and dysphagia. She completed on consolidation treatment with immunotherapy with Imfinzi (Durvalumab) status post 18 cycles. She also completed SBRT to the right supraclavicular lymphadenopathy. The patient had evidence for multiple brain metastasis in October 2020 and she underwent SRS treatment to this lesion under the care of Dr. Lisbeth Renshaw. The patient had evidence for disease progression and she started systemic chemotherapy with carboplatin, Alimta and Keytruda status post 19 cycles.  Starting from cycle #5 she is on maintenance treatment with Alimta and Keytruda every 3 weeks. She continues to tolerate her treatment well with no concerning adverse effect except for the mild fatigue from the anemia. I recommended for  her to proceed with cycle #20 today as planned. I will see her back for follow-up visit in 3 weeks for evaluation before starting cycle #21 with repeat CT scan of the chest, abdomen pelvis for restaging of her disease. The patient was advised to call immediately if she has any concerning symptoms in the interval. The patient voices understanding of current disease status and treatment options and is in agreement with the current care plan. All questions were answered. The patient knows to call the clinic with any problems, questions or concerns. We can certainly see the patient much sooner if necessary.  Disclaimer: This note was dictated with voice recognition software. Similar  sounding words can inadvertently be transcribed and may not be corrected upon review.

## 2020-09-25 NOTE — Progress Notes (Signed)
CrCl 40 today. Decrease Alimta to 400mg /m2 for today per MD.  Acquanetta Belling, RPH, BCPS, BCOP 09/25/2020 11:39 AM

## 2020-09-25 NOTE — Patient Instructions (Signed)
[facility]  Discharge Instructions: Thank you for choosing [facility] to provide your oncology and hematology care.   If you have a lab appointment with the [facility], please go directly to the [facility] and check in at the registration area.   Wear comfortable clothing and clothing appropriate for easy access to any Portacath or PICC line.   We strive to give you quality time with your provider. You may need to reschedule your appointment if you arrive late (15 or more minutes).  Arriving late affects you and other patients whose appointments are after yours.  Also, if you miss three or more appointments without notifying the office, you may be dismissed from the clinic at the provider's discretion.      For prescription refill requests, have your pharmacy contact our office and allow 72 hours for refills to be completed.    Today you received the following chemotherapy and/or immunotherapy agents  Keytruda and Alimta   To help prevent nausea and vomiting after your treatment, we encourage you to take your nausea medication as directed.  BELOW ARE SYMPTOMS THAT SHOULD BE REPORTED IMMEDIATELY: . *FEVER GREATER THAN 100.4 F (38 C) OR HIGHER . *CHILLS OR SWEATING . *NAUSEA AND VOMITING THAT IS NOT CONTROLLED WITH YOUR NAUSEA MEDICATION . *UNUSUAL SHORTNESS OF BREATH . *UNUSUAL BRUISING OR BLEEDING . *URINARY PROBLEMS (pain or burning when urinating, or frequent urination) . *BOWEL PROBLEMS (unusual diarrhea, constipation, pain near the anus) . TENDERNESS IN MOUTH AND THROAT WITH OR WITHOUT PRESENCE OF ULCERS (sore throat, sores in mouth, or a toothache) . UNUSUAL RASH, SWELLING OR PAIN  . UNUSUAL VAGINAL DISCHARGE OR ITCHING   Items with * indicate a potential emergency and should be followed up as soon as possible or go to the Emergency Department if any problems should occur.  Please show the CHEMOTHERAPY ALERT CARD or IMMUNOTHERAPY  ALERT CARD at check-in to the Emergency Department and triage nurse.  Should you have questions after your visit or need to cancel or reschedule your appointment, please contact [facility]  Dept: [phone]  and follow the prompts.  Office hours are 8:00 a.m. to 4:30 p.m. Monday - Friday. Please note that voicemails left after 4:00 p.m. may not be returned until the following business day.  We are closed weekends and major holidays. You have access to a nurse at all times for urgent questions. Please call the main number to the clinic Dept: [phone] and follow the prompts.   For any non-urgent questions, you may also contact your provider using MyChart. We now offer e-Visits for anyone 24 and older to request care online for non-urgent symptoms. For details visit [email or web address].   Also download the MyChart app! Go to the app store, search "MyChart", open the app, select [facility], and log in with your MyChart username and password.  Due to Covid, a mask is required upon entering the hospital/clinic. If you do not have a mask, one will be given to you upon arrival. For doctor visits, patients may have 1 support person aged 78 or older with them. For treatment visits, patients cannot have anyone with them due to current Covid guidelines and our immunocompromised population.

## 2020-09-27 ENCOUNTER — Other Ambulatory Visit: Payer: Self-pay | Admitting: Internal Medicine

## 2020-10-09 ENCOUNTER — Other Ambulatory Visit: Payer: Self-pay

## 2020-10-09 ENCOUNTER — Ambulatory Visit (HOSPITAL_COMMUNITY)
Admission: RE | Admit: 2020-10-09 | Discharge: 2020-10-09 | Disposition: A | Discharge status: Home / Self Care | Payer: Medicare PPO | Source: Ambulatory Visit | Attending: Internal Medicine | Admitting: Internal Medicine

## 2020-10-09 DIAGNOSIS — C3490 Malignant neoplasm of unspecified part of unspecified bronchus or lung: Secondary | ICD-10-CM

## 2020-10-09 DIAGNOSIS — J90 Pleural effusion, not elsewhere classified: Secondary | ICD-10-CM | POA: Diagnosis not present

## 2020-10-09 DIAGNOSIS — J432 Centrilobular emphysema: Secondary | ICD-10-CM | POA: Diagnosis not present

## 2020-10-09 DIAGNOSIS — I700 Atherosclerosis of aorta: Secondary | ICD-10-CM | POA: Diagnosis not present

## 2020-10-09 DIAGNOSIS — I2510 Atherosclerotic heart disease of native coronary artery without angina pectoris: Secondary | ICD-10-CM | POA: Diagnosis not present

## 2020-10-09 MED ORDER — IOHEXOL 300 MG/ML  SOLN
75.0000 mL | Freq: Once | INTRAMUSCULAR | Status: AC | PRN
  Administered 2020-10-09: 75 mL via INTRAVENOUS

## 2020-10-09 MED ORDER — SODIUM CHLORIDE (PF) 0.9 % IJ SOLN
INTRAMUSCULAR | Status: AC
  Filled 2020-10-09: qty 50

## 2020-10-11 DIAGNOSIS — C3490 Malignant neoplasm of unspecified part of unspecified bronchus or lung: Secondary | ICD-10-CM | POA: Diagnosis not present

## 2020-10-11 NOTE — Progress Notes (Deleted)
Office Visit Note  Patient: Susan Holder             Date of Birth: 11-Feb-1956           MRN: [id number]             PCP: [name], DO Referring: [name], DO Visit Date: 10/25/2020 Occupation: @GUAROCC @  Subjective:  No chief complaint on file.   History of Present Illness: Susan Holder is a 65 y.o. female ***   Activities of Daily Living:  Patient reports morning stiffness for *** {minute/hour:19697}.   Patient {ACTIONS;DENIES/REPORTS:21021675::"Denies"} nocturnal pain.  Difficulty dressing/grooming: {ACTIONS;DENIES/REPORTS:21021675::"Denies"} Difficulty climbing stairs: {ACTIONS;DENIES/REPORTS:21021675::"Denies"} Difficulty getting out of chair: {ACTIONS;DENIES/REPORTS:21021675::"Denies"} Difficulty using hands for taps, buttons, cutlery, and/or writing: {ACTIONS;DENIES/REPORTS:21021675::"Denies"}  No Rheumatology ROS completed.   PMFS History:  Patient Active Problem List   Diagnosis Date Noted   Brain metastases ([place]) 03/29/2019   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[facility]) 11/04/2018   Acid reflux 09/21/2018   Hoarseness 03/16/2018   Encounter for antineoplastic immunotherapy 02/01/2018   Primary malignant neoplasm of bronchus of right lower lobe ([place]) 12/21/2017   Adenocarcinoma of right lung, stage 4 ([facility]) 11/17/2017   Encounter for antineoplastic chemotherapy 11/17/2017   Goals of care, counseling/discussion 11/17/2017   Lung mass    Left hand pain 03/06/2017   Rheumatoid arthritis involving multiple sites with positive rheumatoid factor ([place]) 05/29/2016   ANA positive 05/29/2016   Vitamin D deficiency 05/29/2016   High risk medication use 05/29/2016   Trigger finger, left ring finger 05/29/2016   Trigger finger, right ring finger 05/29/2016   DDD (degenerative disc disease), cervical 05/29/2016   Smoker 05/29/2016   Other fatigue 05/29/2016   Allergic rhinitis 08/22/2013    Past Medical History:  Diagnosis Date   met  lung ca dx'd 09/2017   neck LN and brain 2020   Rheumatoid arthritis ([place])     Family History  Problem Relation Age of Onset   Stroke Mother    Alzheimer's disease Mother    Heart disease Mother    Emphysema Father    Hypertension Brother    Heart attack Maternal Aunt    Heart failure Maternal Grandmother    Hypertension Paternal Grandmother    Past Surgical History:  Procedure Laterality Date   BRONCHIAL NEEDLE ASPIRATION BIOPSY  11/09/2017   Procedure: BRONCHIAL NEEDLE ASPIRATION BIOPSIES;  Surgeon: Marshell Garfinkel, MD;  Location: [facility];  Service: Cardiopulmonary;;   ENDOBRONCHIAL ULTRASOUND Bilateral 11/09/2017   Procedure: ENDOBRONCHIAL ULTRASOUND;  Surgeon: Marshell Garfinkel, MD;  Location: [facility];  Service: Cardiopulmonary;  Laterality: Bilateral;   NASAL SINUS SURGERY     NASAL SINUS SURGERY     TUBAL LIGATION     VIDEO BRONCHOSCOPY  11/09/2017   Procedure: VIDEO BRONCHOSCOPY;  Surgeon: Marshell Garfinkel, MD;  Location: [facility];  Service: Cardiopulmonary;;   Social History   Social History Narrative   Not on file   Immunization History  Administered Date(s) Administered   Influenza, High Dose Seasonal PF 01/22/2019   Influenza,inj,Quad PF,6+ Mos 03/08/2018   Influenza-Unspecified 02/08/2020   PFIZER(Purple Top)SARS-COV-2 Vaccination 06/02/2019, 06/23/2019, 12/24/2019     Objective: Vital Signs: LMP 04/28/2000    Physical Exam   Musculoskeletal Exam:   CDAI Exam: CDAI Score: -- Patient Global: --; Provider Global: -- Swollen: --; Tender: -- Joint Exam 10/25/2020   No joint exam has been documented for this visit   There is currently no information documented on the homunculus. Go  to the Rheumatology activity and complete the homunculus joint exam.  Investigation: No additional findings.  Imaging: CT Chest W Contrast  Result Date: 10/09/2020 CLINICAL DATA:  Metastatic non-small cell lung cancer. EXAM: CT CHEST, ABDOMEN, AND PELVIS WITH  CONTRAST TECHNIQUE: Multidetector CT imaging of the chest, abdomen and pelvis was performed following the standard protocol during bolus administration of intravenous contrast. CONTRAST:  78mL OMNIPAQUE IOHEXOL 300 MG/ML  SOLN COMPARISON:  Chest CT 07/20/2020. Chest abdomen pelvis CT 05/16/2020 FINDINGS: CT CHEST FINDINGS Cardiovascular: The heart size is normal. No substantial pericardial effusion. Continued further mild progression of pericardial fluid, most prominently anteriorly along the right heart border Coronary artery calcification is evident. Atherosclerotic calcification is noted in the wall of the thoracic aorta. Mediastinum/Nodes: Stable soft tissue fullness in the subcarinal station without definite lymphadenopathy today. There is no hilar lymphadenopathy. The esophagus has normal imaging features. There is no axillary lymphadenopathy. Lungs/Pleura: Centrilobular and paraseptal emphysema evident. Right parahilar scarring is stable in the interval compatible with treatment related fibrosis. Bullous change with pleuroparenchymal scarring posterior right apex is also stable in the interval. There is some mild volume loss in the right hemithorax, as before. No new suspicious pulmonary nodule or mass. 4 mm left lower lobe nodule on 65/7 is unchanged. Small right pleural effusion is new since 07/20/2020. Musculoskeletal: No worrisome lytic or sclerotic osseous abnormality. CT ABDOMEN PELVIS FINDINGS Hepatobiliary: No suspicious focal abnormality within the liver parenchyma. There is no evidence for gallstones, gallbladder wall thickening, or pericholecystic fluid. No intrahepatic or extrahepatic biliary dilation. Pancreas: No focal mass lesion. No dilatation of the main duct. No intraparenchymal cyst. No peripancreatic edema. Spleen: No splenomegaly. No focal mass lesion. Adrenals/Urinary Tract: No adrenal nodule or mass. Kidneys unremarkable. No evidence for hydroureter. The urinary bladder appears normal  for the degree of distention. Stomach/Bowel: Stomach is nondistended. Duodenum is normally positioned as is the ligament of Treitz. No small bowel wall thickening. No small bowel dilatation. The terminal ileum is normal. The appendix is normal. No gross colonic mass. No colonic wall thickening. Vascular/Lymphatic: There is abdominal aortic atherosclerosis without aneurysm. There is no gastrohepatic or hepatoduodenal ligament lymphadenopathy. No retroperitoneal or mesenteric lymphadenopathy. No pelvic sidewall lymphadenopathy. Reproductive: The uterus is unremarkable.  There is no adnexal mass. Other: No intraperitoneal free fluid. Musculoskeletal: No worrisome lytic or sclerotic osseous abnormality. IMPRESSION: 1. Stable appearance of post treatment changes in the right parahilar lung. New small right pleural effusion, but otherwise no new or progressive findings to suggest definite recurrent or metastatic disease in the chest, abdomen, or pelvis. 2. Continued further mild progression of pericardial fluid, most prominently anteriorly along the right heart border. 3. Stable 4 mm left lower lobe pulmonary nodule. 4. Aortic Atherosclerosis (ICD10-I70.0) and Emphysema (ICD10-J43.9). Electronically Signed   By: Misty Stanley M.D.   On: 10/09/2020 13:15   CT Abdomen Pelvis W Contrast  Result Date: 10/09/2020 CLINICAL DATA:  Metastatic non-small cell lung cancer. EXAM: CT CHEST, ABDOMEN, AND PELVIS WITH CONTRAST TECHNIQUE: Multidetector CT imaging of the chest, abdomen and pelvis was performed following the standard protocol during bolus administration of intravenous contrast. CONTRAST:  68mL OMNIPAQUE IOHEXOL 300 MG/ML  SOLN COMPARISON:  Chest CT 07/20/2020. Chest abdomen pelvis CT 05/16/2020 FINDINGS: CT CHEST FINDINGS Cardiovascular: The heart size is normal. No substantial pericardial effusion. Continued further mild progression of pericardial fluid, most prominently anteriorly along the right heart border Coronary  artery calcification is evident. Atherosclerotic calcification is noted in the wall of the thoracic  aorta. Mediastinum/Nodes: Stable soft tissue fullness in the subcarinal station without definite lymphadenopathy today. There is no hilar lymphadenopathy. The esophagus has normal imaging features. There is no axillary lymphadenopathy. Lungs/Pleura: Centrilobular and paraseptal emphysema evident. Right parahilar scarring is stable in the interval compatible with treatment related fibrosis. Bullous change with pleuroparenchymal scarring posterior right apex is also stable in the interval. There is some mild volume loss in the right hemithorax, as before. No new suspicious pulmonary nodule or mass. 4 mm left lower lobe nodule on 65/7 is unchanged. Small right pleural effusion is new since 07/20/2020. Musculoskeletal: No worrisome lytic or sclerotic osseous abnormality. CT ABDOMEN PELVIS FINDINGS Hepatobiliary: No suspicious focal abnormality within the liver parenchyma. There is no evidence for gallstones, gallbladder wall thickening, or pericholecystic fluid. No intrahepatic or extrahepatic biliary dilation. Pancreas: No focal mass lesion. No dilatation of the main duct. No intraparenchymal cyst. No peripancreatic edema. Spleen: No splenomegaly. No focal mass lesion. Adrenals/Urinary Tract: No adrenal nodule or mass. Kidneys unremarkable. No evidence for hydroureter. The urinary bladder appears normal for the degree of distention. Stomach/Bowel: Stomach is nondistended. Duodenum is normally positioned as is the ligament of Treitz. No small bowel wall thickening. No small bowel dilatation. The terminal ileum is normal. The appendix is normal. No gross colonic mass. No colonic wall thickening. Vascular/Lymphatic: There is abdominal aortic atherosclerosis without aneurysm. There is no gastrohepatic or hepatoduodenal ligament lymphadenopathy. No retroperitoneal or mesenteric lymphadenopathy. No pelvic sidewall  lymphadenopathy. Reproductive: The uterus is unremarkable.  There is no adnexal mass. Other: No intraperitoneal free fluid. Musculoskeletal: No worrisome lytic or sclerotic osseous abnormality. IMPRESSION: 1. Stable appearance of post treatment changes in the right parahilar lung. New small right pleural effusion, but otherwise no new or progressive findings to suggest definite recurrent or metastatic disease in the chest, abdomen, or pelvis. 2. Continued further mild progression of pericardial fluid, most prominently anteriorly along the right heart border. 3. Stable 4 mm left lower lobe pulmonary nodule. 4. Aortic Atherosclerosis (ICD10-I70.0) and Emphysema (ICD10-J43.9). Electronically Signed   By: Misty Stanley M.D.   On: 10/09/2020 13:15    Recent Labs: Lab Results  Component Value Date   WBC 9.2 09/25/2020   HGB 10.1 (L) 09/25/2020   PLT 454 (H) 09/25/2020   NA 142 09/25/2020   K 3.6 09/25/2020   CL 106 09/25/2020   CO2 23 09/25/2020   GLUCOSE 98 09/25/2020   BUN 13 09/25/2020   CREATININE 1.13 (H) 09/25/2020   BILITOT <0.2 (L) 09/25/2020   ALKPHOS 77 09/25/2020   AST 27 09/25/2020   ALT 9 09/25/2020   PROT 8.0 09/25/2020   ALBUMIN 3.0 (L) 09/25/2020   CALCIUM 10.3 09/25/2020   GFRAA >60 01/17/2020    Speciality Comments: PLQ eye exam: 01/19/2020 WNL @ [facility]. Follow up in 1 year.  Procedures:  No procedures performed Allergies: Patient has no known allergies.   Assessment / Plan:     Visit Diagnoses: No diagnosis found.  Orders: No orders of the defined types were placed in this encounter.  No orders of the defined types were placed in this encounter.   Face-to-face time spent with patient was *** minutes. Greater than 50% of time was spent in counseling and coordination of care.  Follow-Up Instructions: No follow-ups on file.   Earnestine Mealing, CMA  Note - This record has been created using Editor, commissioning.  Chart creation errors have been sought,  but may not always  have been located. Such  creation errors do not reflect on  the standard of medical care.  

## 2020-10-15 ENCOUNTER — Inpatient Hospital Stay: Payer: Medicare PPO | Attending: Internal Medicine

## 2020-10-15 ENCOUNTER — Inpatient Hospital Stay: Payer: Medicare PPO

## 2020-10-15 ENCOUNTER — Other Ambulatory Visit: Payer: Self-pay

## 2020-10-15 ENCOUNTER — Inpatient Hospital Stay: Payer: Medicare PPO | Admitting: Internal Medicine

## 2020-10-15 VITALS — BP 133/65 | HR 109 | Temp 96.2°F | Resp 19 | Ht 60.0 in | Wt 110.8 lb

## 2020-10-15 DIAGNOSIS — C3491 Malignant neoplasm of unspecified part of right bronchus or lung: Secondary | ICD-10-CM

## 2020-10-15 DIAGNOSIS — Z79899 Other long term (current) drug therapy: Secondary | ICD-10-CM | POA: Diagnosis not present

## 2020-10-15 DIAGNOSIS — C3431 Malignant neoplasm of lower lobe, right bronchus or lung: Secondary | ICD-10-CM

## 2020-10-15 DIAGNOSIS — C7931 Secondary malignant neoplasm of brain: Secondary | ICD-10-CM | POA: Insufficient documentation

## 2020-10-15 DIAGNOSIS — Z5112 Encounter for antineoplastic immunotherapy: Secondary | ICD-10-CM | POA: Insufficient documentation

## 2020-10-15 DIAGNOSIS — Z5111 Encounter for antineoplastic chemotherapy: Secondary | ICD-10-CM | POA: Diagnosis not present

## 2020-10-15 DIAGNOSIS — M069 Rheumatoid arthritis, unspecified: Secondary | ICD-10-CM | POA: Diagnosis not present

## 2020-10-15 DIAGNOSIS — Z923 Personal history of irradiation: Secondary | ICD-10-CM | POA: Diagnosis not present

## 2020-10-15 DIAGNOSIS — D6481 Anemia due to antineoplastic chemotherapy: Secondary | ICD-10-CM | POA: Diagnosis not present

## 2020-10-15 LAB — CBC WITH DIFFERENTIAL (CANCER CENTER ONLY)
Abs Immature Granulocytes: 0.02 10*3/uL (ref 0.00–0.07)
Basophils Absolute: 0 10*3/uL (ref 0.0–0.1)
Basophils Relative: 0 %
Eosinophils Absolute: 0.1 10*3/uL (ref 0.0–0.5)
Eosinophils Relative: 1 %
HCT: 30.8 % — ABNORMAL LOW (ref 36.0–46.0)
Hemoglobin: 9.8 g/dL — ABNORMAL LOW (ref 12.0–15.0)
Immature Granulocytes: 0 %
Lymphocytes Relative: 13 %
Lymphs Abs: 1 10*3/uL (ref 0.7–4.0)
MCH: 33.1 pg (ref 26.0–34.0)
MCHC: 31.8 g/dL (ref 30.0–36.0)
MCV: 104.1 fL — ABNORMAL HIGH (ref 80.0–100.0)
Monocytes Absolute: 0.7 10*3/uL (ref 0.1–1.0)
Monocytes Relative: 9 %
Neutro Abs: 6.2 10*3/uL (ref 1.7–7.7)
Neutrophils Relative %: 77 %
Platelet Count: 512 10*3/uL — ABNORMAL HIGH (ref 150–400)
RBC: 2.96 MIL/uL — ABNORMAL LOW (ref 3.87–5.11)
RDW: 14.5 % (ref 11.5–15.5)
WBC Count: 8 10*3/uL (ref 4.0–10.5)
nRBC: 0 % (ref 0.0–0.2)

## 2020-10-15 LAB — CMP (CANCER CENTER ONLY)
ALT: 10 U/L (ref 0–44)
AST: 26 U/L (ref 15–41)
Albumin: 3.2 g/dL — ABNORMAL LOW (ref 3.5–5.0)
Alkaline Phosphatase: 63 U/L (ref 38–126)
Anion gap: 9 (ref 5–15)
BUN: 14 mg/dL (ref 8–23)
CO2: 26 mmol/L (ref 22–32)
Calcium: 9.8 mg/dL (ref 8.9–10.3)
Chloride: 104 mmol/L (ref 98–111)
Creatinine: 1.21 mg/dL — ABNORMAL HIGH (ref 0.44–1.00)
GFR, Estimated: 49 mL/min — ABNORMAL LOW (ref >60)
Glucose, Bld: 88 mg/dL (ref 70–99)
Potassium: 3.8 mmol/L (ref 3.5–5.1)
Sodium: 139 mmol/L (ref 135–145)
Total Bilirubin: 0.3 mg/dL (ref 0.3–1.2)
Total Protein: 7.9 g/dL (ref 6.5–8.1)

## 2020-10-15 LAB — TSH: TSH: 2.828 u[IU]/mL (ref 0.350–4.500)

## 2020-10-15 MED ORDER — SODIUM CHLORIDE 0.9 % IV SOLN
200.0000 mg | Freq: Once | INTRAVENOUS | Status: AC
  Administered 2020-10-15: 200 mg via INTRAVENOUS
  Filled 2020-10-15: qty 8

## 2020-10-15 MED ORDER — PROCHLORPERAZINE MALEATE 10 MG PO TABS
ORAL_TABLET | ORAL | Status: AC
  Filled 2020-10-15: qty 1

## 2020-10-15 MED ORDER — SODIUM CHLORIDE 0.9 % IV SOLN
Freq: Once | INTRAVENOUS | Status: AC
  Filled 2020-10-15: qty 250

## 2020-10-15 MED ORDER — PROCHLORPERAZINE MALEATE 10 MG PO TABS
10.0000 mg | ORAL_TABLET | Freq: Once | ORAL | Status: AC
Start: 2020-10-15 — End: 2020-10-15
  Administered 2020-10-15: 10 mg via ORAL

## 2020-10-15 MED ORDER — SODIUM CHLORIDE 0.9 % IV SOLN
400.0000 mg/m2 | Freq: Once | INTRAVENOUS | Status: AC
  Administered 2020-10-15: 600 mg via INTRAVENOUS
  Filled 2020-10-15: qty 20

## 2020-10-15 NOTE — Progress Notes (Signed)
Decrease alimta to 400mg /m2 for crcl = 3mL/min

## 2020-10-15 NOTE — Patient Instructions (Signed)
[facility]  Discharge Instructions: Thank you for choosing [facility] to provide your oncology and hematology care.   If you have a lab appointment with the [facility], please go directly to the [facility] and check in at the registration area.   Wear comfortable clothing and clothing appropriate for easy access to any Portacath or PICC line.   We strive to give you quality time with your provider. You may need to reschedule your appointment if you arrive late (15 or more minutes).  Arriving late affects you and other patients whose appointments are after yours.  Also, if you miss three or more appointments without notifying the office, you may be dismissed from the clinic at the provider's discretion.      For prescription refill requests, have your pharmacy contact our office and allow 72 hours for refills to be completed.    Today you received the following chemotherapy and/or immunotherapy agents keytruda/alimta      To help prevent nausea and vomiting after your treatment, we encourage you to take your nausea medication as directed.  BELOW ARE SYMPTOMS THAT SHOULD BE REPORTED IMMEDIATELY: *FEVER GREATER THAN 100.4 F (38 C) OR HIGHER *CHILLS OR SWEATING *NAUSEA AND VOMITING THAT IS NOT CONTROLLED WITH YOUR NAUSEA MEDICATION *UNUSUAL SHORTNESS OF BREATH *UNUSUAL BRUISING OR BLEEDING *URINARY PROBLEMS (pain or burning when urinating, or frequent urination) *BOWEL PROBLEMS (unusual diarrhea, constipation, pain near the anus) TENDERNESS IN MOUTH AND THROAT WITH OR WITHOUT PRESENCE OF ULCERS (sore throat, sores in mouth, or a toothache) UNUSUAL RASH, SWELLING OR PAIN  UNUSUAL VAGINAL DISCHARGE OR ITCHING   Items with * indicate a potential emergency and should be followed up as soon as possible or go to the Emergency Department if any problems should occur.  Please show the CHEMOTHERAPY ALERT CARD or IMMUNOTHERAPY ALERT CARD at  check-in to the Emergency Department and triage nurse.  Should you have questions after your visit or need to cancel or reschedule your appointment, please contact [facility]  Dept: [phone]  and follow the prompts.  Office hours are 8:00 a.m. to 4:30 p.m. Monday - Friday. Please note that voicemails left after 4:00 p.m. may not be returned until the following business day.  We are closed weekends and major holidays. You have access to a nurse at all times for urgent questions. Please call the main number to the clinic Dept: [phone] and follow the prompts.   For any non-urgent questions, you may also contact your provider using MyChart. We now offer e-Visits for anyone 37 and older to request care online for non-urgent symptoms. For details visit [email or web address].   Also download the MyChart app! Go to the app store, search "MyChart", open the app, select [facility], and log in with your MyChart username and password.  Due to Covid, a mask is required upon entering the hospital/clinic. If you do not have a mask, one will be given to you upon arrival. For doctor visits, patients may have 1 support person aged 18 or older with them. For treatment visits, patients cannot have anyone with them due to current Covid guidelines and our immunocompromised population.

## 2020-10-15 NOTE — Progress Notes (Signed)
[facility] [phone]   [phone]  OFFICE PROGRESS NOTE  [name]  DIAGNOSIS: Metastatic non-small cell lung cancer initially diagnosed as stage IIIA (T3, N1/N2, M0) non-small cell lung cancer, adenocarcinoma presented with large right lower lobe lung mass with extension to the right hilum and subcarinal area diagnosed in July 2019.  She has brain metastasis in October 2020.  Biomarker Findings Tumor Mutational Burden - TMB-Intermediate (6 Muts/Mb) Microsatellite status - MS-Stable Genomic Findings For a complete list of the genes assayed, please refer to the Appendix. NRAS Q61R ARAF amplification STK11 G56W KRAS G13D MYCN amplification MCL1 amplification NKX2-1 amplification - equivocal? TP53 G245V 7 Disease relevant genes with no reportable alterations: EGFR, ALK, BRAF, MET, ERBB2, RET, ROS1   PRIOR THERAPY:  1) Course of concurrent chemoradiation with weekly carboplatin for AUC of 2 and paclitaxel 45 mg/M2.  Status post 7 cycles.  Last dose was giving 01/11/2018. 2) Consolidation treatment with immunotherapy with Imfinzi (Durvalumab) 10 mg/KG every 2 weeks.  First dose February 09, 2018.  Status post 19 cycles. 3) status post stereotactic body radiotherapy to the enlarging right supraclavicular lymphadenopathy under the care of Dr. Lisbeth Renshaw. 4) SRS to multiple brain metastasis under the care of Dr. Lisbeth Renshaw.  CURRENT THERAPY: Systemic chemotherapy with carboplatin for AUC of 5, Alimta 500 mg/M2 and Keytruda 200 mg IV every 3 weeks.  First dose 08/16/2019.  Status post 20 cycles.  Starting from cycle #5 the patient is on maintenance treatment with Alimta and Keytruda every 3 weeks.  INTERVAL HISTORY: Susan Holder 65 y.o. female returns to the clinic today for follow-up visit.  The patient is feeling fine today with no concerning complaints except for 3 pounds of weight loss  secondary to lack of appetite and dry mouth.  The patient denied having any current chest pain, shortness of breath, cough or hemoptysis.  She has no nausea, vomiting, diarrhea or constipation.  She denied having any headache or visual changes.  She continues to tolerate her maintenance treatment with Alimta and Keytruda fairly well.  The patient had repeat CT scan of the chest, abdomen pelvis performed recently and she is here for evaluation and discussion of her risk her results.   MEDICAL HISTORY: Past Medical History:  Diagnosis Date   met lung ca dx'd 09/2017   neck LN and brain 2020   Rheumatoid arthritis ([place])     ALLERGIES:  has No Known Allergies.  MEDICATIONS:  Current Outpatient Medications  Medication Sig Dispense Refill   diclofenac sodium (VOLTAREN) 1 % GEL Apply 2 g topically 4 (four) times daily.     Fluocinolone Acetonide Scalp 9.48 % OIL      folic acid (FOLVITE) 1 MG tablet TAKE 1 TABLET(1 MG) BY MOUTH DAILY 30 tablet 4   hydroxychloroquine (PLAQUENIL) 200 MG tablet Take 1 tablet by mouth twice daily Monday through Friday only 120 tablet 0   omeprazole (PRILOSEC) 20 MG capsule Take 20 mg by mouth daily.     ondansetron (ZOFRAN) 8 MG tablet Take 1 tablet (8 mg total) by mouth every 8 (eight) hours as needed for nausea or vomiting. (Patient not taking: Reported on 09/25/2020) 30 tablet 0   prochlorperazine (COMPAZINE) 10 MG tablet TAKE 1 TABLET(10 MG) BY MOUTH EVERY 6 HOURS AS NEEDED FOR NAUSEA OR VOMITING 30 tablet 0   pseudoephedrine-guaifenesin (MUCINEX D) 60-600 MG 12 hr tablet Take 1 tablet by  mouth as needed.  (Patient not taking: Reported on 09/25/2020)     vitamin C (ASCORBIC ACID) 500 MG tablet Take 500 mg by mouth daily.     VITAMIN D PO Take by mouth.     No current facility-administered medications for this visit.    SURGICAL HISTORY:  Past Surgical History:  Procedure Laterality Date   BRONCHIAL NEEDLE ASPIRATION BIOPSY  11/09/2017   Procedure: BRONCHIAL  NEEDLE ASPIRATION BIOPSIES;  Surgeon: Marshell Garfinkel, MD;  Location: [facility];  Service: Cardiopulmonary;;   ENDOBRONCHIAL ULTRASOUND Bilateral 11/09/2017   Procedure: ENDOBRONCHIAL ULTRASOUND;  Surgeon: Marshell Garfinkel, MD;  Location: [facility];  Service: Cardiopulmonary;  Laterality: Bilateral;   NASAL SINUS SURGERY     NASAL SINUS SURGERY     TUBAL LIGATION     VIDEO BRONCHOSCOPY  11/09/2017   Procedure: VIDEO BRONCHOSCOPY;  Surgeon: Marshell Garfinkel, MD;  Location: [facility];  Service: Cardiopulmonary;;    REVIEW OF SYSTEMS:  Constitutional: positive for fatigue and weight loss Eyes: negative Ears, nose, mouth, throat, and face: negative Respiratory: negative Cardiovascular: negative Gastrointestinal: negative Genitourinary:negative Integument/breast: negative Hematologic/lymphatic: negative Musculoskeletal:negative Neurological: negative Behavioral/Psych: negative Endocrine: negative Allergic/Immunologic: negative   PHYSICAL EXAMINATION: General appearance: alert, cooperative, and no distress Head: Normocephalic, without obvious abnormality, atraumatic Neck: no adenopathy, no JVD, supple, symmetrical, trachea midline, and thyroid not enlarged, symmetric, no tenderness/mass/nodules Lymph nodes: Cervical, supraclavicular, and axillary nodes normal. Resp: clear to auscultation bilaterally Back: symmetric, no curvature. ROM normal. No CVA tenderness. Cardio: regular rate and rhythm, S1, S2 normal, no murmur, click, rub or gallop and tachycardic GI: soft, non-tender; bowel sounds normal; no masses,  no organomegaly Extremities: extremities normal, atraumatic, no cyanosis or edema Neurologic: Alert and oriented X 3, normal strength and tone. Normal symmetric reflexes. Normal coordination and gait  ECOG PERFORMANCE STATUS: 0 - Asymptomatic  Blood pressure 133/65, pulse (!) 109, temperature (!) 96.2 F (35.7 C), temperature source Tympanic, resp. rate 19, height 5' (1.524  m), weight 110 lb 12.8 oz (50.3 kg), last menstrual period 04/28/2000, SpO2 100 %.  LABORATORY DATA: Lab Results  Component Value Date   WBC 8.0 10/15/2020   HGB 9.8 (L) 10/15/2020   HCT 30.8 (L) 10/15/2020   MCV 104.1 (H) 10/15/2020   PLT 512 (H) 10/15/2020      Chemistry      Component Value Date/Time   NA 142 09/25/2020 0825   K 3.6 09/25/2020 0825   CL 106 09/25/2020 0825   CO2 23 09/25/2020 0825   BUN 13 09/25/2020 0825   CREATININE 1.13 (H) 09/25/2020 0825   CREATININE 0.80 07/24/2017 1438      Component Value Date/Time   CALCIUM 10.3 09/25/2020 0825   ALKPHOS 77 09/25/2020 0825   AST 27 09/25/2020 0825   ALT 9 09/25/2020 0825   BILITOT <0.2 (L) 09/25/2020 0825       RADIOGRAPHIC STUDIES: CT Chest W Contrast  Result Date: 10/09/2020 CLINICAL DATA:  Metastatic non-small cell lung cancer. EXAM: CT CHEST, ABDOMEN, AND PELVIS WITH CONTRAST TECHNIQUE: Multidetector CT imaging of the chest, abdomen and pelvis was performed following the standard protocol during bolus administration of intravenous contrast. CONTRAST:  21m OMNIPAQUE IOHEXOL 300 MG/ML  SOLN COMPARISON:  Chest CT 07/20/2020. Chest abdomen pelvis CT 05/16/2020 FINDINGS: CT CHEST FINDINGS Cardiovascular: The heart size is normal. No substantial pericardial effusion. Continued further mild progression of pericardial fluid, most prominently anteriorly along the right heart border Coronary artery calcification is evident. Atherosclerotic calcification is noted in the  wall of the thoracic aorta. Mediastinum/Nodes: Stable soft tissue fullness in the subcarinal station without definite lymphadenopathy today. There is no hilar lymphadenopathy. The esophagus has normal imaging features. There is no axillary lymphadenopathy. Lungs/Pleura: Centrilobular and paraseptal emphysema evident. Right parahilar scarring is stable in the interval compatible with treatment related fibrosis. Bullous change with pleuroparenchymal scarring  posterior right apex is also stable in the interval. There is some mild volume loss in the right hemithorax, as before. No new suspicious pulmonary nodule or mass. 4 mm left lower lobe nodule on 65/7 is unchanged. Small right pleural effusion is new since 07/20/2020. Musculoskeletal: No worrisome lytic or sclerotic osseous abnormality. CT ABDOMEN PELVIS FINDINGS Hepatobiliary: No suspicious focal abnormality within the liver parenchyma. There is no evidence for gallstones, gallbladder wall thickening, or pericholecystic fluid. No intrahepatic or extrahepatic biliary dilation. Pancreas: No focal mass lesion. No dilatation of the main duct. No intraparenchymal cyst. No peripancreatic edema. Spleen: No splenomegaly. No focal mass lesion. Adrenals/Urinary Tract: No adrenal nodule or mass. Kidneys unremarkable. No evidence for hydroureter. The urinary bladder appears normal for the degree of distention. Stomach/Bowel: Stomach is nondistended. Duodenum is normally positioned as is the ligament of Treitz. No small bowel wall thickening. No small bowel dilatation. The terminal ileum is normal. The appendix is normal. No gross colonic mass. No colonic wall thickening. Vascular/Lymphatic: There is abdominal aortic atherosclerosis without aneurysm. There is no gastrohepatic or hepatoduodenal ligament lymphadenopathy. No retroperitoneal or mesenteric lymphadenopathy. No pelvic sidewall lymphadenopathy. Reproductive: The uterus is unremarkable.  There is no adnexal mass. Other: No intraperitoneal free fluid. Musculoskeletal: No worrisome lytic or sclerotic osseous abnormality. IMPRESSION: 1. Stable appearance of post treatment changes in the right parahilar lung. New small right pleural effusion, but otherwise no new or progressive findings to suggest definite recurrent or metastatic disease in the chest, abdomen, or pelvis. 2. Continued further mild progression of pericardial fluid, most prominently anteriorly along the right  heart border. 3. Stable 4 mm left lower lobe pulmonary nodule. 4. Aortic Atherosclerosis (ICD10-I70.0) and Emphysema (ICD10-J43.9). Electronically Signed   By: Misty Stanley M.D.   On: 10/09/2020 13:15   CT Abdomen Pelvis W Contrast  Result Date: 10/09/2020 CLINICAL DATA:  Metastatic non-small cell lung cancer. EXAM: CT CHEST, ABDOMEN, AND PELVIS WITH CONTRAST TECHNIQUE: Multidetector CT imaging of the chest, abdomen and pelvis was performed following the standard protocol during bolus administration of intravenous contrast. CONTRAST:  33m OMNIPAQUE IOHEXOL 300 MG/ML  SOLN COMPARISON:  Chest CT 07/20/2020. Chest abdomen pelvis CT 05/16/2020 FINDINGS: CT CHEST FINDINGS Cardiovascular: The heart size is normal. No substantial pericardial effusion. Continued further mild progression of pericardial fluid, most prominently anteriorly along the right heart border Coronary artery calcification is evident. Atherosclerotic calcification is noted in the wall of the thoracic aorta. Mediastinum/Nodes: Stable soft tissue fullness in the subcarinal station without definite lymphadenopathy today. There is no hilar lymphadenopathy. The esophagus has normal imaging features. There is no axillary lymphadenopathy. Lungs/Pleura: Centrilobular and paraseptal emphysema evident. Right parahilar scarring is stable in the interval compatible with treatment related fibrosis. Bullous change with pleuroparenchymal scarring posterior right apex is also stable in the interval. There is some mild volume loss in the right hemithorax, as before. No new suspicious pulmonary nodule or mass. 4 mm left lower lobe nodule on 65/7 is unchanged. Small right pleural effusion is new since 07/20/2020. Musculoskeletal: No worrisome lytic or sclerotic osseous abnormality. CT ABDOMEN PELVIS FINDINGS Hepatobiliary: No suspicious focal abnormality within the liver parenchyma. There is  no evidence for gallstones, gallbladder wall thickening, or pericholecystic  fluid. No intrahepatic or extrahepatic biliary dilation. Pancreas: No focal mass lesion. No dilatation of the main duct. No intraparenchymal cyst. No peripancreatic edema. Spleen: No splenomegaly. No focal mass lesion. Adrenals/Urinary Tract: No adrenal nodule or mass. Kidneys unremarkable. No evidence for hydroureter. The urinary bladder appears normal for the degree of distention. Stomach/Bowel: Stomach is nondistended. Duodenum is normally positioned as is the ligament of Treitz. No small bowel wall thickening. No small bowel dilatation. The terminal ileum is normal. The appendix is normal. No gross colonic mass. No colonic wall thickening. Vascular/Lymphatic: There is abdominal aortic atherosclerosis without aneurysm. There is no gastrohepatic or hepatoduodenal ligament lymphadenopathy. No retroperitoneal or mesenteric lymphadenopathy. No pelvic sidewall lymphadenopathy. Reproductive: The uterus is unremarkable.  There is no adnexal mass. Other: No intraperitoneal free fluid. Musculoskeletal: No worrisome lytic or sclerotic osseous abnormality. IMPRESSION: 1. Stable appearance of post treatment changes in the right parahilar lung. New small right pleural effusion, but otherwise no new or progressive findings to suggest definite recurrent or metastatic disease in the chest, abdomen, or pelvis. 2. Continued further mild progression of pericardial fluid, most prominently anteriorly along the right heart border. 3. Stable 4 mm left lower lobe pulmonary nodule. 4. Aortic Atherosclerosis (ICD10-I70.0) and Emphysema (ICD10-J43.9). Electronically Signed   By: Misty Stanley M.D.   On: 10/09/2020 13:15    ASSESSMENT AND PLAN: This is a very pleasant 65 years old African-American female with metastatic non-small cell lung cancer initially diagnosed with a stage IIIA non-small cell lung cancer, adenocarcinoma.  She underwent a course of concurrent chemoradiation with weekly carboplatin and paclitaxel status post 7 cycles  with partial response.   The patient tolerated this course of treatment well except for mild odynophagia and dysphagia. She completed on consolidation treatment with immunotherapy with Imfinzi (Durvalumab) status post 18 cycles. She also completed SBRT to the right supraclavicular lymphadenopathy. The patient had evidence for multiple brain metastasis in October 2020 and she underwent SRS treatment to this lesion under the care of Dr. Lisbeth Renshaw. The patient had evidence for disease progression and she started systemic chemotherapy with carboplatin, Alimta and Keytruda status post 20 cycles.  Starting from cycle #5 she is on maintenance treatment with Alimta and Keytruda every 3 weeks. The patient continues to tolerate her treatment well with no concerning complaints except for mild fatigue secondary to chemotherapy-induced anemia. She had repeat CT scan of the chest, abdomen pelvis performed recently.  I personally and independently reviewed the scan images and discussed the results with the patient today and showed her the images. Her scan showed no concerning findings for disease progression but there was more enlargement of the pericardial fluid most predominantly anteriorly along the right heart border. The patient is also tachycardic.  I recommended for her to see cardiology for further evaluation of her condition and recommendation regarding 2D echo management of her tachycardia and the pericardial effusion. I will see her back for follow-up visit in 3 weeks for evaluation before the next cycle of her treatment. The patient was advised to call immediately if she has any concerning symptoms in the interval. The patient voices understanding of current disease status and treatment options and is in agreement with the current care plan. All questions were answered. The patient knows to call the clinic with any problems, questions or concerns. We can certainly see the patient much sooner if  necessary.  Disclaimer: This note was dictated with voice recognition software. Similar  sounding words can inadvertently be transcribed and may not be corrected upon review.

## 2020-10-15 NOTE — Progress Notes (Signed)
Per Dr. Julien Nordmann, okay to treat with elevated HR.

## 2020-10-22 DIAGNOSIS — M069 Rheumatoid arthritis, unspecified: Secondary | ICD-10-CM | POA: Insufficient documentation

## 2020-10-22 DIAGNOSIS — C801 Malignant (primary) neoplasm, unspecified: Secondary | ICD-10-CM | POA: Insufficient documentation

## 2020-10-23 ENCOUNTER — Ambulatory Visit: Payer: Medicare PPO | Admitting: Cardiology

## 2020-10-23 ENCOUNTER — Other Ambulatory Visit: Payer: Self-pay

## 2020-10-23 ENCOUNTER — Encounter: Payer: Self-pay | Admitting: Cardiology

## 2020-10-23 VITALS — BP 122/68 | HR 121 | Ht 60.0 in | Wt 107.1 lb

## 2020-10-23 DIAGNOSIS — I2510 Atherosclerotic heart disease of native coronary artery without angina pectoris: Secondary | ICD-10-CM

## 2020-10-23 DIAGNOSIS — I3139 Other pericardial effusion (noninflammatory): Secondary | ICD-10-CM

## 2020-10-23 DIAGNOSIS — C3491 Malignant neoplasm of unspecified part of right bronchus or lung: Secondary | ICD-10-CM | POA: Diagnosis not present

## 2020-10-23 DIAGNOSIS — I700 Atherosclerosis of aorta: Secondary | ICD-10-CM | POA: Diagnosis not present

## 2020-10-23 DIAGNOSIS — I313 Pericardial effusion (noninflammatory): Secondary | ICD-10-CM

## 2020-10-23 HISTORY — DX: Atherosclerosis of aorta: I70.0

## 2020-10-23 HISTORY — DX: Pericardial effusion (noninflammatory): I31.3

## 2020-10-23 HISTORY — DX: Other pericardial effusion (noninflammatory): I31.39

## 2020-10-23 HISTORY — DX: Atherosclerotic heart disease of native coronary artery without angina pectoris: I25.10

## 2020-10-23 NOTE — Patient Instructions (Signed)
Medication Instructions:  No medication changes. *If you need a refill on your cardiac medications before your next appointment, please call your pharmacy*   Lab Work: None ordered If you have labs (blood work) drawn today and your tests are completely normal, you will receive your results only by: [place] (if you have MyChart) OR A paper copy in the mail If you have any lab test that is abnormal or we need to change your treatment, we will call you to review the results.   Testing/Procedures: Your physician has requested that you have an echocardiogram. Echocardiography is a painless test that uses sound waves to create images of your heart. It provides your doctor with information about the size and shape of your heart and how well your heart's chambers and valves are working. This procedure takes approximately one hour. There are no restrictions for this procedure.    Follow-Up: At [facility], you and your health needs are our priority.  As part of our continuing mission to provide you with exceptional heart care, we have created designated Provider Care Teams.  These Care Teams include your primary Cardiologist (physician) and Advanced Practice Providers (APPs -  Physician Assistants and Nurse Practitioners) who all work together to provide you with the care you need, when you need it.  We recommend signing up for the patient portal called "MyChart".  Sign up information is provided on this After Visit Summary.  MyChart is used to connect with patients for Virtual Visits (Telemedicine).  Patients are able to view lab/test results, encounter notes, upcoming appointments, etc.  Non-urgent messages can be sent to your provider as well.   To learn more about what you can do with MyChart, go to [email or web address].    Your next appointment:   3 month(s)  The format for your next appointment:   In Person  Provider:   Jyl Heinz, MD   Other  Instructions Echocardiogram An echocardiogram is a test that uses sound waves (ultrasound) to produce images of the heart. Images from an echocardiogram can provide important information about: Heart size and shape. The size and thickness and movement of your heart's walls. Heart muscle function and strength. Heart valve function or if you have stenosis. Stenosis is when the heart valves are too narrow. If blood is flowing backward through the heart valves (regurgitation). A tumor or infectious growth around the heart valves. Areas of heart muscle that are not working well because of poor blood flow or injury from a heart attack. Aneurysm detection. An aneurysm is a weak or damaged part of an artery wall. The wall bulges out from the normal force of blood pumping through the body. Tell a health care provider about: Any allergies you have. All medicines you are taking, including vitamins, herbs, eye drops, creams, and over-the-counter medicines. Any blood disorders you have. Any surgeries you have had. Any medical conditions you have. Whether you are pregnant or may be pregnant. What are the risks? Generally, this is a safe test. However, problems may occur, including an allergic reaction to dye (contrast) that may be used during the test. What happens before the test? No specific preparation is needed. You may eat and drink normally. What happens during the test? You will take off your clothes from the waist up and put on a hospital gown. Electrodes or electrocardiogram (ECG)patches may be placed on your chest. The electrodes or patches are then connected to a device that monitors your heart rate and rhythm. You will  lie down on a table for an ultrasound exam. A gel will be applied to your chest to help sound waves pass through your skin. A handheld device, called a transducer, will be pressed against your chest and moved over your heart. The transducer produces sound waves that travel to  your heart and bounce back (or "echo" back) to the transducer. These sound waves will be captured in real-time and changed into images of your heart that can be viewed on a video monitor. The images will be recorded on a computer and reviewed by your health care provider. You may be asked to change positions or hold your breath for a short time. This makes it easier to get different views or better views of your heart. In some cases, you may receive contrast through an IV in one of your veins. This can improve the quality of the pictures from your heart. The procedure may vary among health care providers and hospitals.   What can I expect after the test? You may return to your normal, everyday life, including diet, activities, and medicines, unless your health care provider tells you not to do that. Follow these instructions at home: It is up to you to get the results of your test. Ask your health care provider, or the department that is doing the test, when your results will be ready. Keep all follow-up visits. This is important. Summary An echocardiogram is a test that uses sound waves (ultrasound) to produce images of the heart. Images from an echocardiogram can provide important information about the size and shape of your heart, heart muscle function, heart valve function, and other possible heart problems. You do not need to do anything to prepare before this test. You may eat and drink normally. After the echocardiogram is completed, you may return to your normal, everyday life, unless your health care provider tells you not to do that. This information is not intended to replace advice given to you by your health care provider. Make sure you discuss any questions you have with your health care provider. Document Revised: 12/06/2019 Document Reviewed: 12/06/2019 Elsevier Patient Education  2021 [employer].

## 2020-10-23 NOTE — Progress Notes (Signed)
Cardiology Office Note:    Date:  10/23/2020   ID:  [name], DOB [birth year], MRN [id number]  PCP:  [name], DO  Cardiologist:  Jenean Lindau, MD   Referring MD: Curt Bears, MD    ASSESSMENT:    1. Adenocarcinoma of right lung, stage 4 ([place])   2. Aortic atherosclerosis ([place])   3. Coronary artery calcification seen on CT scan   4. Pericardial effusion    PLAN:    In order of problems listed above:  I discussed my findings with the patient at extensive length. Pericardial effusion: CT scan report was reviewed and I will set her up for an echocardiogram to assess this in better detail. Sinus tachycardia: Of unclear etiology.  She is asymptomatic at this time.  Am not keen on intervening with medication such as beta-blockers for fear of hypotension.  Her blood pressure is borderline.  This may have adverse consequences such as fall and fractures which I am really concerned about in this lady with multiple advanced comorbidities. Coronary artery calcification and aortic atherosclerosis: I discussed this with the patient at length.  Medical management at this time in view of above-mentioned comorbidities. Patient will be seen in follow-up appointment in 6 months or earlier if the patient has any concerns.  Patient had multiple questions which were answered to her satisfaction.   Medication Adjustments/Labs and Tests Ordered: Current medicines are reviewed at length with the patient today.  Concerns regarding medicines are outlined above.  No orders of the defined types were placed in this encounter.  No orders of the defined types were placed in this encounter.    History of Present Illness:    Susan Holder is a 65 y.o. female who is being seen today for the evaluation of pericardial effusion at the request of Curt Bears, MD. patient is a pleasant 65 year old female.  She has past medical history of smoking which she has quit a few years ago.   She is now having lung cancer stage IV and is being treated for this.  She has metastasis.  She was sent here for evaluation for pericardial effusion.  She also has elevated heart rate for a long time.  Overall she leads a sedentary lifestyle because of advanced issues mentioned above.  No chest pain orthopnea PND palpitations or any syncopal spells.  At the time of my evaluation, the patient is alert awake oriented and in no distress.  Past Medical History:  Diagnosis Date   Acid reflux 09/21/2018   Adenocarcinoma of right lung, stage 4 ([place]) 11/17/2017   Allergic rhinitis 08/22/2013   ANA positive 05/29/2016   Brain metastases ([place]) 03/29/2019   DDD (degenerative disc disease), cervical 05/29/2016   Encounter for antineoplastic chemotherapy 11/17/2017   Encounter for antineoplastic immunotherapy 02/01/2018   Goals of care, counseling/discussion 11/17/2017   High risk medication use 05/29/2016   04/29/2016: ==> plq 200 am & 100 qhs(adeq response).   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   Other fatigue 05/29/2016   Primary malignant neoplasm of bronchus of right lower lobe ([place]) 12/21/2017   Rheumatoid arthritis (H[facility])    Rheumatoid arthritis involving multiple sites with positive rheumatoid factor ([place]) 05/29/2016   +RF +ANA +CCP    Smoker 05/29/2016   Trigger finger, left ring finger 05/29/2016  Trigger finger, right ring finger 05/29/2016   Vitamin D deficiency 05/29/2016    Past Surgical History:  Procedure Laterality Date   BRONCHIAL NEEDLE ASPIRATION BIOPSY  11/09/2017   Procedure: BRONCHIAL NEEDLE ASPIRATION BIOPSIES;  Surgeon: Chilton Greathouse, MD;  Location: [facility];  Service: Cardiopulmonary;;   ENDOBRONCHIAL ULTRASOUND Bilateral 11/09/2017   Procedure: ENDOBRONCHIAL ULTRASOUND;  Surgeon: Chilton Greathouse, MD;  Location: [facility];  Service: Cardiopulmonary;   Laterality: Bilateral;   NASAL SINUS SURGERY     NASAL SINUS SURGERY     TUBAL LIGATION     VIDEO BRONCHOSCOPY  11/09/2017   Procedure: VIDEO BRONCHOSCOPY;  Surgeon: Chilton Greathouse, MD;  Location: [facility];  Service: Cardiopulmonary;;    Current Medications: Current Meds  Medication Sig   diclofenac sodium (VOLTAREN) 1 % GEL Apply 2 g topically 4 (four) times daily.   Fluocinolone Acetonide Scalp 0.01 % OIL Apply 1 application topically daily.   folic acid (FOLVITE) 1 MG tablet Take 1 mg by mouth daily.   hydroxychloroquine (PLAQUENIL) 200 MG tablet Take 1 tablet by mouth twice daily Monday through Friday only   omeprazole (PRILOSEC) 20 MG capsule Take 20 mg by mouth daily.   prochlorperazine (COMPAZINE) 10 MG tablet Take 10 mg by mouth every 6 (six) hours as needed for nausea/vomiting.   vitamin C (ASCORBIC ACID) 500 MG tablet Take 500 mg by mouth daily.   VITAMIN D PO Take 1 tablet by mouth daily.     Allergies:   Patient has no known allergies.   Social History   Socioeconomic History   Marital status: Divorced    Spouse name: Not on file   Number of children: Not on file   Years of education: Not on file   Highest education level: Not on file  Occupational History   Not on file  Tobacco Use   Smoking status: Former    Packs/day: 0.50    Years: 45.00    Pack years: 22.50    Types: Cigarettes    Quit date: 09/29/2017    Years since quitting: 3.0   Smokeless tobacco: Never  Vaping Use   Vaping Use: Never used  Substance and Sexual Activity   Alcohol use: Not Currently   Drug use: No   Sexual activity: Not Currently  Other Topics Concern   Not on file  Social History Narrative   Not on file   Social Determinants of Health   Financial Resource Strain: Not on file  Food Insecurity: Not on file  Transportation Needs: Not on file  Physical Activity: Not on file  Stress: Not on file  Social Connections: Not on file     Family History: The patient's family  history includes Alzheimer's disease in her mother; Emphysema in her father; Heart attack in her maternal aunt; Heart disease in her mother; Heart failure in her maternal grandmother; Hypertension in her brother and paternal grandmother; Stroke in her mother.  ROS:   Please see the history of present illness.    All other systems reviewed and are negative.  EKGs/Labs/Other Studies Reviewed:    The following studies were reviewed today: I discussed my findings with the patient in extensive length.  EKG was sinus rhythm and nonspecific ST-T changes.   Recent Labs: 10/15/2020: ALT 10; BUN 14; Creatinine 1.21; Hemoglobin 9.8; Platelet Count 512; Potassium 3.8; Sodium 139; TSH 2.828  Recent Lipid Panel No results found for: CHOL, TRIG, HDL, CHOLHDL, VLDL, LDLCALC, LDLDIRECT  Physical Exam:    VS:  BP 122/68   Pulse (!) 121   Ht 5' (1.524 m)   Wt 107 lb 1.3 oz (48.6 kg)   LMP 04/28/2000   SpO2 96%   BMI 20.91 kg/m     Wt Readings from Last 3 Encounters:  10/23/20 107 lb 1.3 oz (48.6 kg)  10/15/20 110 lb 12.8 oz (50.3 kg)  09/25/20 113 lb 11.2 oz (51.6 kg)     GEN: Patient is in no acute distress HEENT: Normal NECK: No JVD; No carotid bruits LYMPHATICS: No lymphadenopathy CARDIAC: S1 S2 regular, 2/6 systolic murmur at the apex. RESPIRATORY:  Clear to auscultation without rales, wheezing or rhonchi  ABDOMEN: Soft, non-tender, non-distended MUSCULOSKELETAL:  No edema; No deformity  SKIN: Warm and dry NEUROLOGIC:  Alert and oriented x 3 PSYCHIATRIC:  Normal affect    Signed, Jenean Lindau, MD  10/23/2020 2:21 PM    [facility]

## 2020-10-25 ENCOUNTER — Ambulatory Visit: Payer: Medicare PPO | Admitting: Physician Assistant

## 2020-11-01 ENCOUNTER — Ambulatory Visit
Admission: RE | Admit: 2020-11-01 | Discharge: 2020-11-01 | Disposition: A | Discharge status: Home / Self Care | Payer: Medicare PPO | Source: Ambulatory Visit | Attending: Radiation Oncology | Admitting: Radiation Oncology

## 2020-11-01 ENCOUNTER — Other Ambulatory Visit: Payer: Self-pay

## 2020-11-01 DIAGNOSIS — C7949 Secondary malignant neoplasm of other parts of nervous system: Secondary | ICD-10-CM

## 2020-11-01 DIAGNOSIS — C7931 Secondary malignant neoplasm of brain: Secondary | ICD-10-CM

## 2020-11-01 DIAGNOSIS — G9389 Other specified disorders of brain: Secondary | ICD-10-CM | POA: Diagnosis not present

## 2020-11-01 MED ORDER — GADOBENATE DIMEGLUMINE 529 MG/ML IV SOLN
11.0000 mL | Freq: Once | INTRAVENOUS | Status: AC | PRN
  Administered 2020-11-01: 11 mL via INTRAVENOUS

## 2020-11-02 NOTE — Progress Notes (Signed)
[facility] OFFICE PROGRESS NOTE  [name], DO [place] [address]  DIAGNOSIS: Metastatic non-small cell lung cancer initially diagnosed as stage IIIA (T3, N1/N2, M0) non-small cell lung cancer, adenocarcinoma presented with large right lower lobe lung mass with extension to the right hilum and subcarinal area diagnosed in July 2019.  She has brain metastasis in October 2020.   Biomarker Findings Tumor Mutational Burden - TMB-Intermediate (6 Muts/Mb) Microsatellite status - MS-Stable Genomic Findings For a complete list of the genes assayed, please refer to the Appendix. NRAS Q61R ARAF amplification STK11 G56W KRAS G13D MYCN amplification MCL1 amplification NKX2-1 amplification - equivocal? TP53 G245V 7 Disease relevant genes with no reportable alterations: EGFR, ALK, BRAF, MET, ERBB2, RET, ROS1   PRIOR THERAPY: 1) Course of concurrent chemoradiation with weekly carboplatin for AUC of 2 and paclitaxel 45 mg/M2.  Status post 7 cycles.  Last dose was giving 01/11/2018. 2) Consolidation treatment with immunotherapy with Imfinzi (Durvalumab) 10 mg/KG every 2 weeks.  First dose February 09, 2018.  Status post 19 cycles. 3) status post stereotactic body radiotherapy to the enlarging right supraclavicular lymphadenopathy under the care of Dr. Lisbeth Renshaw. 4) SRS to multiple brain metastasis under the care of Dr. Lisbeth Renshaw.  CURRENT THERAPY: Systemic chemotherapy with carboplatin for AUC of 5, Alimta 500 mg/M2 and Keytruda 200 mg IV every 3 weeks.  First dose 08/16/2019.  Status post 21 cycles.  Starting from cycle #5 the patient is on maintenance treatment with Alimta and Keytruda every 3 weeks. Alimta was reduced to 400 mg/m2 starting from cycle #21.  INTERVAL HISTORY: Susan Holder 65 y.o. female returns to the clinic today for a follow-up visit.  The patient is feeling fairly well today without any concerning complaints except her usual concerns  with dry mouth and fatigue. She has been using salt water rinses and biotene for her dry mouth. She also received a new toothpaste from her dentist. She states the dry mouth is slightly improved. Dr. Julien Nordmann reduced her dose of alimta with her last cycle of treatment due to fatigue and slightly elevated creatinine. She did not notice an appreciable difference.  At the patient's last appointment, she had a restaging CT scan performed which showed no evidence of disease progression but some increase in the pericardial effusion.  She also has had ongoing sinus tachycardia.  Dr. Julien Nordmann recommended that she follow-up with her cardiologist which she did on 10/23/2020.  Her cardiologist recommended a repeat echocardiogram which is scheduled for 11/08/20. She is asymptomatic from her tachycardia. She denies palpitations.    Otherwise, the patient denies any new concerning complaints today.  She denies any fever, chills, or night sweats.  She denies any chest pain or hemoptysis. She denies shortness of breath with walking unless climbing the stairs. She recently went to the [facility] and was outside in the heat all day and did well with her breathing. She reports her baseline dry cough which has been the same for several years.  She denies any diarrhea or constipation.  She had one episode of vomiting in the interval since her last appointment. When she does take compazine, she does not good control of her nausea. She denies any headache or visual changes.  She is followed closely by radiation oncology for history of metastatic disease to the brain. She had a visit with them yesterday. There is one lesion that appeared to be slightly bigger that they are monitoring. The patient denies any  rashes or skin changes.  She is here today for evaluation before starting cycle #22.  MEDICAL HISTORY: Past Medical History:  Diagnosis Date   Acid reflux 09/21/2018   Adenocarcinoma of right lung, stage 4 ([facility]) 11/17/2017    Allergic rhinitis 08/22/2013   ANA positive 05/29/2016   Brain metastases ([facility]) 03/29/2019   DDD (degenerative disc disease), cervical 05/29/2016   Encounter for antineoplastic chemotherapy 11/17/2017   Encounter for antineoplastic immunotherapy 02/01/2018   Goals of care, counseling/discussion 11/17/2017   High risk medication use 05/29/2016   04/29/2016: ==> plq 200 am & 100 qhs(adeq response).   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[facility]) 11/04/2018   Other fatigue 05/29/2016   Primary malignant neoplasm of bronchus of right lower lobe ([facility]) 12/21/2017   Rheumatoid arthritis ([facility])    Rheumatoid arthritis involving multiple sites with positive rheumatoid factor ([facility]) 05/29/2016   +RF +ANA +CCP    Smoker 05/29/2016   Trigger finger, left ring finger 05/29/2016   Trigger finger, right ring finger 05/29/2016   Vitamin D deficiency 05/29/2016    ALLERGIES:  has No Known Allergies.  MEDICATIONS:  Current Outpatient Medications  Medication Sig Dispense Refill   diclofenac sodium (VOLTAREN) 1 % GEL Apply 2 g topically 4 (four) times daily.     Fluocinolone Acetonide Scalp 0.01 % OIL Apply 1 application topically daily.     folic acid (FOLVITE) 1 MG tablet Take 1 mg by mouth daily.     hydroxychloroquine (PLAQUENIL) 200 MG tablet Take 1 tablet by mouth twice daily Monday through Friday only 120 tablet 0   omeprazole (PRILOSEC) 20 MG capsule Take 20 mg by mouth daily.     prochlorperazine (COMPAZINE) 10 MG tablet Take 10 mg by mouth every 6 (six) hours as needed for nausea/vomiting.     vitamin C (ASCORBIC ACID) 500 MG tablet Take 500 mg by mouth daily.     VITAMIN D PO Take 1 tablet by mouth daily.     No current facility-administered medications for this visit.   Facility-Administered Medications Ordered in Other Visits  Medication Dose Route Frequency Provider Last Rate Last Admin   0.9  %  sodium chloride infusion   Intravenous Once Curt Bears, MD       pembrolizumab [facility]) 200 mg in sodium chloride 0.9 % 50 mL chemo infusion  200 mg Intravenous Once Curt Bears, MD       prochlorperazine (COMPAZINE) tablet 10 mg  10 mg Oral Once Curt Bears, MD        SURGICAL HISTORY:  Past Surgical History:  Procedure Laterality Date   BRONCHIAL NEEDLE ASPIRATION BIOPSY  11/09/2017   Procedure: BRONCHIAL NEEDLE ASPIRATION BIOPSIES;  Surgeon: Marshell Garfinkel, MD;  Location: [facility];  Service: Cardiopulmonary;;   ENDOBRONCHIAL ULTRASOUND Bilateral 11/09/2017   Procedure: ENDOBRONCHIAL ULTRASOUND;  Surgeon: Marshell Garfinkel, MD;  Location: [facility];  Service: Cardiopulmonary;  Laterality: Bilateral;   NASAL SINUS SURGERY     NASAL SINUS SURGERY     TUBAL LIGATION     VIDEO BRONCHOSCOPY  11/09/2017   Procedure: VIDEO BRONCHOSCOPY;  Surgeon: Marshell Garfinkel, MD;  Location: [facility];  Service: Cardiopulmonary;;    REVIEW OF SYSTEMS:   Constitutional: Positive for fatigue. Negative for appetite change, chills, fever and unexpected weight change. HENT: Positive for dry mouth. Negative for mouth sores,  nosebleeds, sore throat and trouble swallowing.   Eyes: Negative for eye problems and icterus. Respiratory: Positive for baseline cough. Negative for hemoptysis, shortness of breath (unless climbing stairs) and wheezing.   Cardiovascular: Negative for chest pain and leg swelling. Gastrointestinal: Positive for occasional nausea and vomiting. Negative for abdominal pain, constipation, or diarrhea.. Genitourinary: Negative for bladder incontinence, difficulty urinating, dysuria, frequency and hematuria.   Musculoskeletal: Negative for back pain, gait problem, neck pain and neck stiffness. Skin: Negative for itching and rash. Neurological: Negative for dizziness, extremity weakness, gait problem, headaches, light-headedness and seizures. Hematological: Negative for  adenopathy. Does not bruise/bleed easily. Psychiatric/Behavioral: Negative for confusion, depression and sleep disturbance. The patient is not nervous/anxious.   PHYSICAL EXAMINATION:  Blood pressure 110/64, pulse (!) 115, temperature (!) 97.3 F (36.3 C), temperature source Tympanic, resp. rate 18, height 5' (1.524 m), weight 108 lb 9.6 oz (49.3 kg), last menstrual period 04/28/2000, SpO2 98 %.  ECOG PERFORMANCE STATUS: 1  Physical Exam  Constitutional: Oriented to person, place, and time and well-developed, well-nourished, and in no distress.  HENT:  Head: Normocephalic and atraumatic.  Mouth/Throat: Oropharynx is clear and moist. No oropharyngeal exudate.  Eyes: Conjunctivae are normal. Right eye exhibits no discharge. Left eye exhibits no discharge. No scleral icterus.  Neck: Normal range of motion. Neck supple.  Cardiovascular: Tachycardic, regular rhythm, normal heart sounds and intact distal pulses.   Pulmonary/Chest: Effort normal and breath sounds normal. No respiratory distress. No wheezes. No rales.  Abdominal: Soft. Bowel sounds are normal. Exhibits no distension and no mass. There is no tenderness.  Musculoskeletal: Normal range of motion. Exhibits no edema.  Lymphadenopathy:    No cervical adenopathy.  Neurological: Alert and oriented to person, place, and time. Exhibits normal muscle tone. Gait normal. Coordination normal.  Skin: Skin is warm and dry. No rash noted. Not diaphoretic. No erythema. No pallor.  Psychiatric: Mood, memory and judgment normal.  Vitals reviewed.  LABORATORY DATA: Lab Results  Component Value Date   WBC 6.9 11/06/2020   HGB 10.2 (L) 11/06/2020   HCT 31.0 (L) 11/06/2020   MCV 101.3 (H) 11/06/2020   PLT 454 (H) 11/06/2020      Chemistry      Component Value Date/Time   NA 139 11/06/2020 0956   K 4.0 11/06/2020 0956   CL 105 11/06/2020 0956   CO2 26 11/06/2020 0956   BUN 14 11/06/2020 0956   CREATININE 1.06 (H) 11/06/2020 0956    CREATININE 0.80 07/24/2017 1438      Component Value Date/Time   CALCIUM 10.6 (H) 11/06/2020 0956   ALKPHOS 75 11/06/2020 0956   AST 22 11/06/2020 0956   ALT 7 11/06/2020 0956   BILITOT <0.2 (L) 11/06/2020 0956       RADIOGRAPHIC STUDIES:  CT Chest W Contrast  Result Date: 10/09/2020 CLINICAL DATA:  Metastatic non-small cell lung cancer. EXAM: CT CHEST, ABDOMEN, AND PELVIS WITH CONTRAST TECHNIQUE: Multidetector CT imaging of the chest, abdomen and pelvis was performed following the standard protocol during bolus administration of intravenous contrast. CONTRAST:  6m OMNIPAQUE IOHEXOL 300 MG/ML  SOLN COMPARISON:  Chest CT 07/20/2020. Chest abdomen pelvis CT 05/16/2020 FINDINGS: CT CHEST FINDINGS Cardiovascular: The heart size is normal. No substantial pericardial effusion. Continued further mild progression of pericardial fluid, most prominently anteriorly along the right heart border Coronary artery calcification is evident. Atherosclerotic calcification is noted in the wall of the thoracic aorta. Mediastinum/Nodes: Stable soft tissue fullness in the subcarinal station without definite lymphadenopathy  today. There is no hilar lymphadenopathy. The esophagus has normal imaging features. There is no axillary lymphadenopathy. Lungs/Pleura: Centrilobular and paraseptal emphysema evident. Right parahilar scarring is stable in the interval compatible with treatment related fibrosis. Bullous change with pleuroparenchymal scarring posterior right apex is also stable in the interval. There is some mild volume loss in the right hemithorax, as before. No new suspicious pulmonary nodule or mass. 4 mm left lower lobe nodule on 65/7 is unchanged. Small right pleural effusion is new since 07/20/2020. Musculoskeletal: No worrisome lytic or sclerotic osseous abnormality. CT ABDOMEN PELVIS FINDINGS Hepatobiliary: No suspicious focal abnormality within the liver parenchyma. There is no evidence for gallstones,  gallbladder wall thickening, or pericholecystic fluid. No intrahepatic or extrahepatic biliary dilation. Pancreas: No focal mass lesion. No dilatation of the main duct. No intraparenchymal cyst. No peripancreatic edema. Spleen: No splenomegaly. No focal mass lesion. Adrenals/Urinary Tract: No adrenal nodule or mass. Kidneys unremarkable. No evidence for hydroureter. The urinary bladder appears normal for the degree of distention. Stomach/Bowel: Stomach is nondistended. Duodenum is normally positioned as is the ligament of Treitz. No small bowel wall thickening. No small bowel dilatation. The terminal ileum is normal. The appendix is normal. No gross colonic mass. No colonic wall thickening. Vascular/Lymphatic: There is abdominal aortic atherosclerosis without aneurysm. There is no gastrohepatic or hepatoduodenal ligament lymphadenopathy. No retroperitoneal or mesenteric lymphadenopathy. No pelvic sidewall lymphadenopathy. Reproductive: The uterus is unremarkable.  There is no adnexal mass. Other: No intraperitoneal free fluid. Musculoskeletal: No worrisome lytic or sclerotic osseous abnormality. IMPRESSION: 1. Stable appearance of post treatment changes in the right parahilar lung. New small right pleural effusion, but otherwise no new or progressive findings to suggest definite recurrent or metastatic disease in the chest, abdomen, or pelvis. 2. Continued further mild progression of pericardial fluid, most prominently anteriorly along the right heart border. 3. Stable 4 mm left lower lobe pulmonary nodule. 4. Aortic Atherosclerosis (ICD10-I70.0) and Emphysema (ICD10-J43.9). Electronically Signed   By: Misty Stanley M.D.   On: 10/09/2020 13:15   MR Brain W Wo Contrast  Result Date: 11/02/2020 CLINICAL DATA:  Metastatic lung cancer post radiation, follow-up EXAM: MRI HEAD WITHOUT AND WITH CONTRAST TECHNIQUE: Multiplanar, multiecho pulse sequences of the brain and surrounding structures were obtained without and  with intravenous contrast. CONTRAST:  28m MULTIHANCE GADOBENATE DIMEGLUMINE 529 MG/ML IV SOLN COMPARISON:  06/29/2020 FINDINGS: Brain: Metastatic lesions are identified and annotated on series 11: Increase in size of inferior left cerebellar lesion (image 33) measuring 6 mm (previously 4 mm); Stable lateral left cerebellar lesion (image 37) measuring 4 mm; Stable right cerebellar lesion (image 43) measuring 2 mm; Stable inferior left frontal operculum (image 92) measuring 2 mm; Stable inferior right frontal lobe (image 67) measuring 3 mm. No new mass or abnormal enhancement. There is no acute infarction or intracranial hemorrhage. Patchy foci of T2 hyperintensity in the supratentorial white matter nonspecific but may reflect stable chronic microvascular ischemic changes. There is no hydrocephalus or extra-axial fluid collection. Vascular: Major vessel flow voids at the skull base are preserved. Skull and upper cervical spine: Normal marrow signal is preserved. Sinuses/Orbits: Minor mucosal thickening.  Orbits are unremarkable. Other: Sella is unremarkable. Minor patchy mastoid fluid opacification. IMPRESSION: Increase in size of inferior left cerebellar lesion. Other lesions are stable. No new lesion. Electronically Signed   By: PMacy MisM.D.   On: 11/02/2020 09:51   CT Abdomen Pelvis W Contrast  Result Date: 10/09/2020 CLINICAL DATA:  Metastatic non-small cell lung cancer.  EXAM: CT CHEST, ABDOMEN, AND PELVIS WITH CONTRAST TECHNIQUE: Multidetector CT imaging of the chest, abdomen and pelvis was performed following the standard protocol during bolus administration of intravenous contrast. CONTRAST:  79m OMNIPAQUE IOHEXOL 300 MG/ML  SOLN COMPARISON:  Chest CT 07/20/2020. Chest abdomen pelvis CT 05/16/2020 FINDINGS: CT CHEST FINDINGS Cardiovascular: The heart size is normal. No substantial pericardial effusion. Continued further mild progression of pericardial fluid, most prominently anteriorly along the  right heart border Coronary artery calcification is evident. Atherosclerotic calcification is noted in the wall of the thoracic aorta. Mediastinum/Nodes: Stable soft tissue fullness in the subcarinal station without definite lymphadenopathy today. There is no hilar lymphadenopathy. The esophagus has normal imaging features. There is no axillary lymphadenopathy. Lungs/Pleura: Centrilobular and paraseptal emphysema evident. Right parahilar scarring is stable in the interval compatible with treatment related fibrosis. Bullous change with pleuroparenchymal scarring posterior right apex is also stable in the interval. There is some mild volume loss in the right hemithorax, as before. No new suspicious pulmonary nodule or mass. 4 mm left lower lobe nodule on 65/7 is unchanged. Small right pleural effusion is new since 07/20/2020. Musculoskeletal: No worrisome lytic or sclerotic osseous abnormality. CT ABDOMEN PELVIS FINDINGS Hepatobiliary: No suspicious focal abnormality within the liver parenchyma. There is no evidence for gallstones, gallbladder wall thickening, or pericholecystic fluid. No intrahepatic or extrahepatic biliary dilation. Pancreas: No focal mass lesion. No dilatation of the main duct. No intraparenchymal cyst. No peripancreatic edema. Spleen: No splenomegaly. No focal mass lesion. Adrenals/Urinary Tract: No adrenal nodule or mass. Kidneys unremarkable. No evidence for hydroureter. The urinary bladder appears normal for the degree of distention. Stomach/Bowel: Stomach is nondistended. Duodenum is normally positioned as is the ligament of Treitz. No small bowel wall thickening. No small bowel dilatation. The terminal ileum is normal. The appendix is normal. No gross colonic mass. No colonic wall thickening. Vascular/Lymphatic: There is abdominal aortic atherosclerosis without aneurysm. There is no gastrohepatic or hepatoduodenal ligament lymphadenopathy. No retroperitoneal or mesenteric lymphadenopathy. No  pelvic sidewall lymphadenopathy. Reproductive: The uterus is unremarkable.  There is no adnexal mass. Other: No intraperitoneal free fluid. Musculoskeletal: No worrisome lytic or sclerotic osseous abnormality. IMPRESSION: 1. Stable appearance of post treatment changes in the right parahilar lung. New small right pleural effusion, but otherwise no new or progressive findings to suggest definite recurrent or metastatic disease in the chest, abdomen, or pelvis. 2. Continued further mild progression of pericardial fluid, most prominently anteriorly along the right heart border. 3. Stable 4 mm left lower lobe pulmonary nodule. 4. Aortic Atherosclerosis (ICD10-I70.0) and Emphysema (ICD10-J43.9). Electronically Signed   By: EMisty StanleyM.D.   On: 10/09/2020 13:15     ASSESSMENT/PLAN:  This is a very pleasant 65year old African American female with metastatic non-small cell lung cancer, adenocarcinoma initially diagnosed as IIIA in July 2019. She had evidence of metastatic disease with brain metastases in October 2020. She has no actionable mutations.    She is status post 7 cycles of concurrent chemoradiation with carboplatin for an AUC of 2 and paclitaxel 45 mg/m2. She had a partial response.   She then underwent consolidation immunotherapy with imfinzi. She is status post 18 cycles.   She completed SBRT to the right supraclavicular lymph node.   She had evidence of multiple brain metastases in October 2020. She underwent SRS treatment to these lesions under the care of Dr. MLisbeth Renshaw   She currently is undergoing systemic chemotherapy with carboplatin for an AUC of 5, Alimta 500 mg/m2 and  Keytruda 200 mg IV every 3 weeks. She is status post 21 cycles. Starting from cycle #5 she started maintenance Alimta and Keytruda. Starting from cycle #21, Dr. Julien Nordmann reduced her alimta to 400 mg/m2.   I reviewed her alimta dose with Dr. Julien Nordmann today. He will continue to reduce her dose to 400 mg/m2 due to fatigue.     Labs were reviewed. Recommend that she proceed with cycle #22 today as scheduled.    We will see her back for a follow up visit in 3 weeks for evaluation before starting cycle #23.    She will continue to follow with her cardiologist for her pericardial effusion and tachycardia. She is scheduled for an echocardiogram on 11/08/20.   She will continue to use compazine if needed for nausea. She also reports some mild dry skin which she was advised to use lotion.   She will continue to drink plenty of fluids and use biotene for her dry mouth. She also will follow with the other recommendations outlined by her dentist.   The patient was advised to call immediately if she has any concerning symptoms in the interval. The patient voices understanding of current disease status and treatment options and is in agreement with the current care plan. All questions were answered. The patient knows to call the clinic with any problems, questions or concerns. We can certainly see the patient much sooner if necessary         No orders of the defined types were placed in this encounter.    The total time spent in the appointment was 20-29 minutes in this encounter.   Illana Nolting L Recardo Linn, PA-C 11/06/20

## 2020-11-05 ENCOUNTER — Ambulatory Visit
Admission: RE | Admit: 2020-11-05 | Discharge: 2020-11-05 | Disposition: A | Discharge status: Home / Self Care | Payer: Medicare PPO | Source: Ambulatory Visit | Attending: Radiation Oncology | Admitting: Radiation Oncology

## 2020-11-05 ENCOUNTER — Inpatient Hospital Stay: Payer: Medicare PPO | Attending: Internal Medicine

## 2020-11-05 DIAGNOSIS — R000 Tachycardia, unspecified: Secondary | ICD-10-CM | POA: Insufficient documentation

## 2020-11-05 DIAGNOSIS — R110 Nausea: Secondary | ICD-10-CM | POA: Insufficient documentation

## 2020-11-05 DIAGNOSIS — C3431 Malignant neoplasm of lower lobe, right bronchus or lung: Secondary | ICD-10-CM

## 2020-11-05 DIAGNOSIS — R682 Dry mouth, unspecified: Secondary | ICD-10-CM | POA: Insufficient documentation

## 2020-11-05 DIAGNOSIS — Z5112 Encounter for antineoplastic immunotherapy: Secondary | ICD-10-CM | POA: Insufficient documentation

## 2020-11-05 DIAGNOSIS — Z85118 Personal history of other malignant neoplasm of bronchus and lung: Secondary | ICD-10-CM | POA: Diagnosis not present

## 2020-11-05 DIAGNOSIS — M5030 Other cervical disc degeneration, unspecified cervical region: Secondary | ICD-10-CM | POA: Insufficient documentation

## 2020-11-05 DIAGNOSIS — E559 Vitamin D deficiency, unspecified: Secondary | ICD-10-CM | POA: Insufficient documentation

## 2020-11-05 DIAGNOSIS — Z5111 Encounter for antineoplastic chemotherapy: Secondary | ICD-10-CM | POA: Insufficient documentation

## 2020-11-05 DIAGNOSIS — C7931 Secondary malignant neoplasm of brain: Secondary | ICD-10-CM | POA: Insufficient documentation

## 2020-11-05 DIAGNOSIS — M069 Rheumatoid arthritis, unspecified: Secondary | ICD-10-CM | POA: Insufficient documentation

## 2020-11-05 DIAGNOSIS — C770 Secondary and unspecified malignant neoplasm of lymph nodes of head, face and neck: Secondary | ICD-10-CM | POA: Insufficient documentation

## 2020-11-05 DIAGNOSIS — Z08 Encounter for follow-up examination after completed treatment for malignant neoplasm: Secondary | ICD-10-CM | POA: Diagnosis not present

## 2020-11-05 DIAGNOSIS — I313 Pericardial effusion (noninflammatory): Secondary | ICD-10-CM | POA: Insufficient documentation

## 2020-11-05 DIAGNOSIS — Z79899 Other long term (current) drug therapy: Secondary | ICD-10-CM | POA: Insufficient documentation

## 2020-11-05 DIAGNOSIS — R5383 Other fatigue: Secondary | ICD-10-CM | POA: Insufficient documentation

## 2020-11-05 NOTE — Progress Notes (Signed)
Radiation Oncology         [phone] ________________________________  Outpatient Follow Up - Conducted via telephone due to current COVID-19 concerns for limiting patient exposure  I spoke with the patient to conduct this consult visit via telephone to spare the patient unnecessary potential exposure in the healthcare setting during the current COVID-19 pandemic. The patient was notified in advance and was offered a [place] meeting to allow for face to face communication but unfortunately reported that they did not have the appropriate resources/technology to support such a visit and instead preferred to proceed with a telephone visit.   ________________________________  Name: Susan Holder MRN: [id number]  Date of Service: 11/05/2020  DOB: July 03, 1955  Post Treatment Telephone Note  DIAGNOSIS:   Progressive Metastatic Stage IIIA, cT3N1-2M0 NSCLC, adenocarcinoma of the right lower lobe with brain metastases  INTERVAL SINCE LAST RADIOTHERAPY: 1 year, 8 months   03/09/2019 SRS Treatment: Each site was treated to 20 Gy in a single fraction PTV1 Post Cerebellum 64m PTV2 Lt Cerebellum 125mPTV3 Lo Rt Vent 56m85mTV4 Rt Frontal 5mm14mV5 Lt Frontal Operc 5mm 53m6 Lt Sylv 5mm  105m1/2020-12/20/2018: The patient's right supraclavicular nodes were treated to 60 Gy over 25 fractions.   11/30/17-01/11/18:  66 Gy to the right chest and regional nodes over 33 fractions  NARRATIVE: Susan Holder-known 65 y.o29 patient to our service with a history of adenocarcinoma of the right lower lobe.  When she was originally diagnosed in the summer 2019 she presented with local regional disease and received chemoradiation.  Unfortunately she recurred in the summer 2020, she did receive palliative radiotherapy to the right supraclavicular node, and in November 2020 was found to have 6 lesions in the brain felt to be metastatic for which she underwent SRS treatment in November 2020. She has been  receiving  systemic therapy with carboplatin/alimta and Keytruda, though carboplatin has been held after cycle 4. She has since been without disease in the brain and her last MRI on 11/01/20 showed stability in the treated sites, again PTV3 not described, but this time did show and increase in the left cerebellar lesions at 6 mm, previously 4 mm representing PTV2. She's contacted by phone to review these results.   On review of systems, the patient reports that she is doing very well overall. She reports she is still having dry mouth related to her alimta but that this is slightly improved. She's actually on her way to the dentist and will discuss other options like Xylimelt or aqueous vitamin e which we discussed. Regarding her brain she feels she is doing well. She denies any headaches. She does have some confusion for a few hours on the day of her infusion but attributes this to premeds. No speech or auditory changes are noted, no other complaints of confusion, weakness, or dizziness are noted.   PAST MEDICAL HISTORY:  Past Medical History:  Diagnosis Date   Acid reflux 09/21/2018   Adenocarcinoma of right lung, stage 4 (HCC) 7[place]/2019   Allergic rhinitis 08/22/2013   ANA positive 05/29/2016   Brain metastases (HCC) 1[place]/2020   DDD (degenerative disc disease), cervical 05/29/2016   Encounter for antineoplastic chemotherapy 11/17/2017   Encounter for antineoplastic immunotherapy 02/01/2018   Goals of care, counseling/discussion 11/17/2017   High risk medication use 05/29/2016   04/29/2016: ==> plq 200 am & 100 qhs(adeq response).   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[place]) 11/04/2018   Other fatigue 05/29/2016   Primary malignant neoplasm of bronchus of right lower lobe (HCC) 12/21/2017   Rheumatoid arthritis (HCC)    Rheumatoid arthritis involving multiple sites with positive rheumatoid  factor ([place]) 05/29/2016   +RF +ANA +CCP    Smoker 05/29/2016   Trigger finger, left ring finger 05/29/2016   Trigger finger, right ring finger 05/29/2016   Vitamin D deficiency 05/29/2016    PAST SURGICAL HISTORY: Past Surgical History:  Procedure Laterality Date   BRONCHIAL NEEDLE ASPIRATION BIOPSY  11/09/2017   Procedure: BRONCHIAL NEEDLE ASPIRATION BIOPSIES;  Surgeon: Marshell Garfinkel, MD;  Location: [facility];  Service: Cardiopulmonary;;   ENDOBRONCHIAL ULTRASOUND Bilateral 11/09/2017   Procedure: ENDOBRONCHIAL ULTRASOUND;  Surgeon: Marshell Garfinkel, MD;  Location: [facility];  Service: Cardiopulmonary;  Laterality: Bilateral;   NASAL SINUS SURGERY     NASAL SINUS SURGERY     TUBAL LIGATION     VIDEO BRONCHOSCOPY  11/09/2017   Procedure: VIDEO BRONCHOSCOPY;  Surgeon: Marshell Garfinkel, MD;  Location: [facility];  Service: Cardiopulmonary;;    PAST SOCIAL HISTORY:  Social History   Socioeconomic History   Marital status: Divorced    Spouse name: Not on file   Number of children: Not on file   Years of education: Not on file   Highest education level: Not on file  Occupational History   Not on file  Tobacco Use   Smoking status: Former    Packs/day: 0.50    Years: 45.00    Pack years: 22.50    Types: Cigarettes    Quit date: 09/29/2017    Years since quitting: 3.1   Smokeless tobacco: Never  Vaping Use   Vaping Use: Never used  Substance and Sexual Activity   Alcohol use: Not Currently   Drug use: No   Sexual activity: Not Currently  Other Topics Concern   Not on file  Social History Narrative   Not on file   Social Determinants of Health   Financial Resource Strain: Not on file  Food Insecurity: Not on file  Transportation Needs: Not on file  Physical Activity: Not on file  Stress: Not on file  Social Connections: Not on file  Intimate Partner Violence: Not on file  The patient is divorced.  She lives in [place] and her 52 year old granddaughter is also at home  with her.  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  PAST FAMILY HISTORY: Family History  Problem Relation Age of Onset   Stroke Mother    Alzheimer's disease Mother    Heart disease Mother    Emphysema Father    Hypertension Brother    Heart attack Maternal Aunt    Heart failure Maternal Grandmother    Hypertension Paternal Grandmother     MEDICATIONS  Current Outpatient Medications  Medication Sig Dispense Refill   diclofenac sodium (VOLTAREN) 1 % GEL Apply 2 g topically 4 (four) times daily.     Fluocinolone Acetonide Scalp 0.01 % OIL Apply 1 application topically daily.     folic acid (FOLVITE) 1 MG tablet Take 1 mg by mouth daily.     hydroxychloroquine (PLAQUENIL) 200 MG tablet Take 1 tablet by mouth twice daily Monday through Friday only 120 tablet 0   omeprazole (PRILOSEC) 20 MG capsule Take 20 mg by mouth daily.     prochlorperazine (COMPAZINE) 10 MG  tablet Take 10 mg by mouth every 6 (six) hours as needed for nausea/vomiting.     vitamin C (ASCORBIC ACID) 500 MG tablet Take 500 mg by mouth daily.     VITAMIN D PO Take 1 tablet by mouth daily.     No current facility-administered medications for this encounter.    ALLERGIES: No Known Allergies  PHYSICAL EXAM:  Unable to assess due to encounter type.   IMPRESSION/PLAN: 1. Progressive Metastatic Stage IIIA, cT3N1-2M0 NSCLC, adenocarcinoma of the right lower lobe with brain metastases. The patient seems to be doing well clinically. We reviewed the rationale for repeat MRI in 4 month's time for surveillance, as it was felt that the changes seen in the left cerebellar lesion (PTV2) were inflammatory in nature consistent with prior radiation especially since this was the largest treated site, and her ongoing immunotherapy. She is in agreement with this plan. She will also continue chemotherapy and immunotherapy with Dr. Julien Nordmann.  2. Stomatitis. This is a common symptom with Alimta. We will follow this expectantly  but encouraged her to discuss this with her dentist.   Given current concerns for patient exposure during the COVID-19 pandemic, this encounter was conducted via telephone.  The patient has given verbal consent for this type of encounter. The time spent during this encounter was 30 minutes and 50% of that time was spent in preparation, discussion, and in the coordination of her care. The attendants for this meeting include Shona Simpson, [facility] and Farrel Gobble.  During the encounter, Shona Simpson [facility] was located in the Radiation Department at the [facility]. SAYSHA MENTA was located remotely in her car on the way to a dental appointment.     Carola Rhine, PAC

## 2020-11-06 ENCOUNTER — Encounter: Payer: Self-pay | Admitting: Physician Assistant

## 2020-11-06 ENCOUNTER — Other Ambulatory Visit: Payer: Self-pay

## 2020-11-06 ENCOUNTER — Inpatient Hospital Stay: Payer: Medicare PPO

## 2020-11-06 ENCOUNTER — Inpatient Hospital Stay: Payer: Medicare PPO | Admitting: Physician Assistant

## 2020-11-06 VITALS — BP 110/64 | HR 115 | Temp 97.3°F | Resp 18 | Ht 60.0 in | Wt 108.6 lb

## 2020-11-06 VITALS — HR 112

## 2020-11-06 DIAGNOSIS — R5383 Other fatigue: Secondary | ICD-10-CM | POA: Diagnosis not present

## 2020-11-06 DIAGNOSIS — C770 Secondary and unspecified malignant neoplasm of lymph nodes of head, face and neck: Secondary | ICD-10-CM | POA: Diagnosis not present

## 2020-11-06 DIAGNOSIS — C3431 Malignant neoplasm of lower lobe, right bronchus or lung: Secondary | ICD-10-CM

## 2020-11-06 DIAGNOSIS — C3491 Malignant neoplasm of unspecified part of right bronchus or lung: Secondary | ICD-10-CM

## 2020-11-06 DIAGNOSIS — C7931 Secondary malignant neoplasm of brain: Secondary | ICD-10-CM | POA: Diagnosis not present

## 2020-11-06 DIAGNOSIS — Z5112 Encounter for antineoplastic immunotherapy: Secondary | ICD-10-CM

## 2020-11-06 DIAGNOSIS — Z5111 Encounter for antineoplastic chemotherapy: Secondary | ICD-10-CM | POA: Diagnosis not present

## 2020-11-06 DIAGNOSIS — M5030 Other cervical disc degeneration, unspecified cervical region: Secondary | ICD-10-CM | POA: Diagnosis not present

## 2020-11-06 DIAGNOSIS — Z79899 Other long term (current) drug therapy: Secondary | ICD-10-CM | POA: Diagnosis not present

## 2020-11-06 DIAGNOSIS — E559 Vitamin D deficiency, unspecified: Secondary | ICD-10-CM | POA: Diagnosis not present

## 2020-11-06 DIAGNOSIS — I313 Pericardial effusion (noninflammatory): Secondary | ICD-10-CM | POA: Diagnosis not present

## 2020-11-06 DIAGNOSIS — M069 Rheumatoid arthritis, unspecified: Secondary | ICD-10-CM | POA: Diagnosis not present

## 2020-11-06 DIAGNOSIS — R000 Tachycardia, unspecified: Secondary | ICD-10-CM | POA: Diagnosis not present

## 2020-11-06 DIAGNOSIS — R682 Dry mouth, unspecified: Secondary | ICD-10-CM | POA: Diagnosis not present

## 2020-11-06 DIAGNOSIS — R110 Nausea: Secondary | ICD-10-CM | POA: Diagnosis not present

## 2020-11-06 LAB — CBC WITH DIFFERENTIAL (CANCER CENTER ONLY)
Abs Immature Granulocytes: 0.03 10*3/uL (ref 0.00–0.07)
Basophils Absolute: 0.1 10*3/uL (ref 0.0–0.1)
Basophils Relative: 1 %
Eosinophils Absolute: 0.1 10*3/uL (ref 0.0–0.5)
Eosinophils Relative: 1 %
HCT: 31 % — ABNORMAL LOW (ref 36.0–46.0)
Hemoglobin: 10.2 g/dL — ABNORMAL LOW (ref 12.0–15.0)
Immature Granulocytes: 0 %
Lymphocytes Relative: 13 %
Lymphs Abs: 0.9 10*3/uL (ref 0.7–4.0)
MCH: 33.3 pg (ref 26.0–34.0)
MCHC: 32.9 g/dL (ref 30.0–36.0)
MCV: 101.3 fL — ABNORMAL HIGH (ref 80.0–100.0)
Monocytes Absolute: 0.8 10*3/uL (ref 0.1–1.0)
Monocytes Relative: 12 %
Neutro Abs: 5.1 10*3/uL (ref 1.7–7.7)
Neutrophils Relative %: 73 %
Platelet Count: 454 10*3/uL — ABNORMAL HIGH (ref 150–400)
RBC: 3.06 MIL/uL — ABNORMAL LOW (ref 3.87–5.11)
RDW: 14.4 % (ref 11.5–15.5)
WBC Count: 6.9 10*3/uL (ref 4.0–10.5)
nRBC: 0 % (ref 0.0–0.2)

## 2020-11-06 LAB — CMP (CANCER CENTER ONLY)
ALT: 7 U/L (ref 0–44)
AST: 22 U/L (ref 15–41)
Albumin: 2.9 g/dL — ABNORMAL LOW (ref 3.5–5.0)
Alkaline Phosphatase: 75 U/L (ref 38–126)
Anion gap: 8 (ref 5–15)
BUN: 14 mg/dL (ref 8–23)
CO2: 26 mmol/L (ref 22–32)
Calcium: 10.6 mg/dL — ABNORMAL HIGH (ref 8.9–10.3)
Chloride: 105 mmol/L (ref 98–111)
Creatinine: 1.06 mg/dL — ABNORMAL HIGH (ref 0.44–1.00)
GFR, Estimated: 58 mL/min — ABNORMAL LOW (ref >60)
Glucose, Bld: 89 mg/dL (ref 70–99)
Potassium: 4 mmol/L (ref 3.5–5.1)
Sodium: 139 mmol/L (ref 135–145)
Total Bilirubin: <0.2 mg/dL — ABNORMAL LOW (ref 0.3–1.2)
Total Protein: 7.9 g/dL (ref 6.5–8.1)

## 2020-11-06 LAB — TSH: TSH: 1.734 u[IU]/mL (ref 0.308–3.960)

## 2020-11-06 MED ORDER — PROCHLORPERAZINE MALEATE 10 MG PO TABS
ORAL_TABLET | ORAL | Status: AC
  Filled 2020-11-06: qty 1

## 2020-11-06 MED ORDER — PROCHLORPERAZINE MALEATE 10 MG PO TABS
10.0000 mg | ORAL_TABLET | Freq: Once | ORAL | Status: AC
  Administered 2020-11-06: 10 mg via ORAL

## 2020-11-06 MED ORDER — SODIUM CHLORIDE 0.9 % IV SOLN
Freq: Once | INTRAVENOUS | Status: AC
Start: 2020-11-06 — End: 2020-11-06
  Filled 2020-11-06: qty 250

## 2020-11-06 MED ORDER — SODIUM CHLORIDE 0.9 % IV SOLN
200.0000 mg | Freq: Once | INTRAVENOUS | Status: AC
  Administered 2020-11-06: 200 mg via INTRAVENOUS
  Filled 2020-11-06: qty 8

## 2020-11-06 MED ORDER — SODIUM CHLORIDE 0.9 % IV SOLN
600.0000 mg | Freq: Once | INTRAVENOUS | Status: AC
  Administered 2020-11-06: 600 mg via INTRAVENOUS
  Filled 2020-11-06: qty 20

## 2020-11-06 NOTE — Patient Instructions (Signed)
[facility] ONCOL[facility]  Discharge Instructions: Thank you for choosing [facility] to provide your oncology and hematology care.   If you have a lab appointment with the [facility], please go directly to the [facility] and check in at the registration area.   Wear comfortable clothing and clothing appropriate for easy access to any Portacath or PICC line.   We strive to give you quality time with your provider. You may need to reschedule your appointment if you arrive late (15 or more minutes).  Arriving late affects you and other patients whose appointments are after yours.  Also, if you miss three or more appointments without notifying the office, you may be dismissed from the clinic at the provider's discretion.      For prescription refill requests, have your pharmacy contact our office and allow 72 hours for refills to be completed.    Today you received the following chemotherapy and/or immunotherapy agents keytruda/alimta      To help prevent nausea and vomiting after your treatment, we encourage you to take your nausea medication as directed.  BELOW ARE SYMPTOMS THAT SHOULD BE REPORTED IMMEDIATELY: *FEVER GREATER THAN 100.4 F (38 C) OR HIGHER *CHILLS OR SWEATING *NAUSEA AND VOMITING THAT IS NOT CONTROLLED WITH YOUR NAUSEA MEDICATION *UNUSUAL SHORTNESS OF BREATH *UNUSUAL BRUISING OR BLEEDING *URINARY PROBLEMS (pain or burning when urinating, or frequent urination) *BOWEL PROBLEMS (unusual diarrhea, constipation, pain near the anus) TENDERNESS IN MOUTH AND THROAT WITH OR WITHOUT PRESENCE OF ULCERS (sore throat, sores in mouth, or a toothache) UNUSUAL RASH, SWELLING OR PAIN  UNUSUAL VAGINAL DISCHARGE OR ITCHING   Items with * indicate a potential emergency and should be followed up as soon as possible or go to the Emergency Department if any problems should occur.  Please show the CHEMOTHERAPY ALERT CARD or IMMUNOTHERAPY ALERT CARD at  check-in to the Emergency Department and triage nurse.  Should you have questions after your visit or need to cancel or reschedule your appointment, please contact [facility]  Dept: [phone]  and follow the prompts.  Office hours are 8:00 a.m. to 4:30 p.m. Monday - Friday. Please note that voicemails left after 4:00 p.m. may not be returned until the following business day.  We are closed weekends and major holidays. You have access to a nurse at all times for urgent questions. Please call the main number to the clinic Dept: [phone] and follow the prompts.   For any non-urgent questions, you may also contact your provider using MyChart. We now offer e-Visits for anyone 13 and older to request care online for non-urgent symptoms. For details visit [email or web address].   Also download the MyChart app! Go to the app store, search "MyChart", open the app, select [facility], and log in with your MyChart username and password.  Due to Covid, a mask is required upon entering the hospital/clinic. If you do not have a mask, one will be given to you upon arrival. For doctor visits, patients may have 1 support person aged 104 or older with them. For treatment visits, patients cannot have anyone with them due to current Covid guidelines and our immunocompromised population.

## 2020-11-06 NOTE — Progress Notes (Signed)
Per Cassie, PA, ok to treat with elevated heart rate.

## 2020-11-07 ENCOUNTER — Encounter: Payer: Self-pay | Admitting: Physician Assistant

## 2020-11-07 ENCOUNTER — Ambulatory Visit: Payer: Medicare PPO | Admitting: Physician Assistant

## 2020-11-07 VITALS — BP 102/72 | HR 114 | Ht 60.0 in | Wt 109.8 lb

## 2020-11-07 DIAGNOSIS — M5030 Other cervical disc degeneration, unspecified cervical region: Secondary | ICD-10-CM

## 2020-11-07 DIAGNOSIS — C3491 Malignant neoplasm of unspecified part of right bronchus or lung: Secondary | ICD-10-CM

## 2020-11-07 DIAGNOSIS — Z79899 Other long term (current) drug therapy: Secondary | ICD-10-CM | POA: Diagnosis not present

## 2020-11-07 DIAGNOSIS — M7021 Olecranon bursitis, right elbow: Secondary | ICD-10-CM

## 2020-11-07 DIAGNOSIS — E559 Vitamin D deficiency, unspecified: Secondary | ICD-10-CM | POA: Diagnosis not present

## 2020-11-07 DIAGNOSIS — M65332 Trigger finger, left middle finger: Secondary | ICD-10-CM | POA: Diagnosis not present

## 2020-11-07 DIAGNOSIS — M0579 Rheumatoid arthritis with rheumatoid factor of multiple sites without organ or systems involvement: Secondary | ICD-10-CM

## 2020-11-07 DIAGNOSIS — M65331 Trigger finger, right middle finger: Secondary | ICD-10-CM | POA: Diagnosis not present

## 2020-11-07 DIAGNOSIS — Z8639 Personal history of other endocrine, nutritional and metabolic disease: Secondary | ICD-10-CM

## 2020-11-07 NOTE — Progress Notes (Signed)
Office Visit Note  Patient: Susan Holder             Date of Birth: 1956/01/21           MRN: [id number]             PCP: [name], DO Referring: [name], DO Visit Date: 11/07/2020 Occupation: _0 @  Subjective:  Medication monitoring  History of Present Illness: Susan Holder is a 65 y.o. female with history of seropositive rheumatoid arthritis and DJD.  Patient is taking Plaquenil 200 mg 1 tablet by mouth twice daily Monday through Friday.  She continues to tolerate Plaquenil without any side effects.  She denies any recent rheumatoid arthritis flares.  She states that she occasionally notices swelling in her ankles if she is outside while the weather is hot.  She also has occasional stiffness in her right knee but denies any joint swelling or discomfort currently. She remains on chemotherapy.  She denies any recent infections.   Activities of Daily Living:  Patient reports morning stiffness for 5-10 minutes.   Patient Denies nocturnal pain.  Difficulty dressing/grooming: Denies Difficulty climbing stairs: Denies Difficulty getting out of chair: Denies Difficulty using hands for taps, buttons, cutlery, and/or writing: Denies  Review of Systems  Constitutional:  Positive for fatigue.  HENT:  Positive for mouth dryness. Negative for mouth sores and nose dryness.   Eyes:  Negative for pain, itching and dryness.  Respiratory:  Positive for shortness of breath. Negative for difficulty breathing.        With exertion   Echo scheduled 11/08/20  Cardiovascular:  Positive for swelling in legs/feet. Negative for chest pain and palpitations.  Gastrointestinal:  Negative for blood in stool, constipation and diarrhea.  Endocrine: Negative for increased urination.  Genitourinary:  Negative for difficulty urinating.  Musculoskeletal:  Positive for joint pain, joint pain and morning stiffness. Negative for joint swelling, myalgias, muscle tenderness and  myalgias.  Skin:  Negative for color change, rash and redness.  Allergic/Immunologic: Negative for susceptible to infections.  Neurological:  Negative for dizziness, numbness, headaches, memory loss and weakness.  Hematological:  Negative for bruising/bleeding tendency.  Psychiatric/Behavioral:  Negative for confusion.    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[place]) 11/04/2018   Acid reflux 09/21/2018   Hoarseness 03/16/2018   Encounter for antineoplastic immunotherapy 02/01/2018   Primary malignant neoplasm of bronchus of right lower lobe ([place]) 12/21/2017   Adenocarcinoma of right lung, stage 4 ([place]) 11/17/2017   Encounter for antineoplastic chemotherapy 11/17/2017   Goals of care, counseling/discussion 11/17/2017   Lung mass    Left hand pain 03/06/2017   Rheumatoid arthritis involving multiple sites with positive rheumatoid factor ([place]) 05/29/2016   ANA positive 05/29/2016   Vitamin D deficiency 05/29/2016   High risk medication use 05/29/2016   Trigger finger, left ring finger 05/29/2016   Trigger finger, right ring finger 05/29/2016   DDD (degenerative disc disease), cervical 05/29/2016   Smoker 05/29/2016   Other fatigue 05/29/2016   Allergic rhinitis 08/22/2013    Past Medical History:  Diagnosis Date   Acid reflux 09/21/2018   Adenocarcinoma of right lung, stage 4 ([place]) 11/17/2017  Allergic rhinitis 08/22/2013   ANA positive 05/29/2016   Brain metastases ([place]) 03/29/2019   DDD (degenerative disc disease), cervical 05/29/2016   Encounter for antineoplastic chemotherapy 11/17/2017   Encounter for antineoplastic immunotherapy 02/01/2018   Goals of care,  counseling/discussion 11/17/2017   High risk medication use 05/29/2016   04/29/2016: ==> plq 200 am & 100 qhs(adeq response).   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[place]) 11/04/2018   Other fatigue 05/29/2016   Primary malignant neoplasm of bronchus of right lower lobe ([place]) 12/21/2017   Rheumatoid arthritis ([facility])    Rheumatoid arthritis involving multiple sites with positive rheumatoid factor ([place]) 05/29/2016   +RF +ANA +CCP    Smoker 05/29/2016   Trigger finger, left ring finger 05/29/2016   Trigger finger, right ring finger 05/29/2016   Vitamin D deficiency 05/29/2016    Family History  Problem Relation Age of Onset   Stroke Mother    Alzheimer's disease Mother    Heart disease Mother    Emphysema Father    Hypertension Brother    Heart attack Maternal Aunt    Heart failure Maternal Grandmother    Hypertension Paternal Grandmother    Past Surgical History:  Procedure Laterality Date   BRONCHIAL NEEDLE ASPIRATION BIOPSY  11/09/2017   Procedure: BRONCHIAL NEEDLE ASPIRATION BIOPSIES;  Surgeon: Marshell Garfinkel, MD;  Location: [facility];  Service: Cardiopulmonary;;   ENDOBRONCHIAL ULTRASOUND Bilateral 11/09/2017   Procedure: ENDOBRONCHIAL ULTRASOUND;  Surgeon: Marshell Garfinkel, MD;  Location: [facility];  Service: Cardiopulmonary;  Laterality: Bilateral;   NASAL SINUS SURGERY     NASAL SINUS SURGERY     TUBAL LIGATION     VIDEO BRONCHOSCOPY  11/09/2017   Procedure: VIDEO BRONCHOSCOPY;  Surgeon: Marshell Garfinkel, MD;  Location: [facility];  Service: Cardiopulmonary;;   Social History   Social History Narrative   Not on file   Immunization History  Administered Date(s) Administered   Influenza, High Dose Seasonal PF 01/22/2019   Influenza,inj,Quad PF,6+ Mos 03/08/2018   Influenza-Unspecified 02/08/2020   PFIZER(Purple Top)SARS-COV-2 Vaccination 06/02/2019, 06/23/2019,  12/24/2019     Objective: Vital Signs: BP 102/72 (BP Location: Left Arm, Patient Position: Sitting, Cuff Size: Normal)   Pulse (!) 114   Ht 5' (1.524 m)   Wt 109 lb 12.8 oz (49.8 kg)   LMP 04/28/2000   BMI 21.44 kg/m    Physical Exam Vitals and nursing note reviewed.  Constitutional:      Appearance: She is well-developed.  HENT:     Head: Normocephalic and atraumatic.  Eyes:     Conjunctiva/sclera: Conjunctivae normal.  Pulmonary:     Effort: Pulmonary effort is normal.  Abdominal:     Palpations: Abdomen is soft.  Musculoskeletal:     Cervical back: Normal range of motion.  Skin:    General: Skin is warm and dry.     Capillary Refill: Capillary refill takes less than 2 seconds.  Neurological:     Mental Status: She is alert and oriented to person, place, and time.  Psychiatric:        Behavior: Behavior normal.     Musculoskeletal Exam: C-spine, thoracic spine, lumbar spine have good range of motion with no discomfort.  Shoulder joints, elbow joints, wrist joints, MCPs, PIPs, DIPs have good range of motion with no synovitis.  Subluxation of the left first MCP  joint noted.  PIP and DIP thickening consistent with osteoarthritis of both hands noted.  Complete fist formation bilaterally.  Hip joints have good range of motion with no discomfort.  Knee joints have good range of motion with no warmth or effusion.  Ankle joints have good range of motion with no tenderness or joint swelling.  No tenderness over MTP joints.  CDAI Exam: CDAI Score: -- Patient Global: --; Provider Global: -- Swollen: --; Tender: -- Joint Exam 11/07/2020   No joint exam has been documented for this visit   There is currently no information documented on the homunculus. Go to the Rheumatology activity and complete the homunculus joint exam.  Investigation: No additional findings.  Imaging: CT Chest W Contrast  Result Date: 10/09/2020 CLINICAL DATA:  Metastatic non-small cell lung cancer.  EXAM: CT CHEST, ABDOMEN, AND PELVIS WITH CONTRAST TECHNIQUE: Multidetector CT imaging of the chest, abdomen and pelvis was performed following the standard protocol during bolus administration of intravenous contrast. CONTRAST:  78m OMNIPAQUE IOHEXOL 300 MG/ML  SOLN COMPARISON:  Chest CT 07/20/2020. Chest abdomen pelvis CT 05/16/2020 FINDINGS: CT CHEST FINDINGS Cardiovascular: The heart size is normal. No substantial pericardial effusion. Continued further mild progression of pericardial fluid, most prominently anteriorly along the right heart border Coronary artery calcification is evident. Atherosclerotic calcification is noted in the wall of the thoracic aorta. Mediastinum/Nodes: Stable soft tissue fullness in the subcarinal station without definite lymphadenopathy today. There is no hilar lymphadenopathy. The esophagus has normal imaging features. There is no axillary lymphadenopathy. Lungs/Pleura: Centrilobular and paraseptal emphysema evident. Right parahilar scarring is stable in the interval compatible with treatment related fibrosis. Bullous change with pleuroparenchymal scarring posterior right apex is also stable in the interval. There is some mild volume loss in the right hemithorax, as before. No new suspicious pulmonary nodule or mass. 4 mm left lower lobe nodule on 65/7 is unchanged. Small right pleural effusion is new since 07/20/2020. Musculoskeletal: No worrisome lytic or sclerotic osseous abnormality. CT ABDOMEN PELVIS FINDINGS Hepatobiliary: No suspicious focal abnormality within the liver parenchyma. There is no evidence for gallstones, gallbladder wall thickening, or pericholecystic fluid. No intrahepatic or extrahepatic biliary dilation. Pancreas: No focal mass lesion. No dilatation of the main duct. No intraparenchymal cyst. No peripancreatic edema. Spleen: No splenomegaly. No focal mass lesion. Adrenals/Urinary Tract: No adrenal nodule or mass. Kidneys unremarkable. No evidence for  hydroureter. The urinary bladder appears normal for the degree of distention. Stomach/Bowel: Stomach is nondistended. Duodenum is normally positioned as is the ligament of Treitz. No small bowel wall thickening. No small bowel dilatation. The terminal ileum is normal. The appendix is normal. No gross colonic mass. No colonic wall thickening. Vascular/Lymphatic: There is abdominal aortic atherosclerosis without aneurysm. There is no gastrohepatic or hepatoduodenal ligament lymphadenopathy. No retroperitoneal or mesenteric lymphadenopathy. No pelvic sidewall lymphadenopathy. Reproductive: The uterus is unremarkable.  There is no adnexal mass. Other: No intraperitoneal free fluid. Musculoskeletal: No worrisome lytic or sclerotic osseous abnormality. IMPRESSION: 1. Stable appearance of post treatment changes in the right parahilar lung. New small right pleural effusion, but otherwise no new or progressive findings to suggest definite recurrent or metastatic disease in the chest, abdomen, or pelvis. 2. Continued further mild progression of pericardial fluid, most prominently anteriorly along the right heart border. 3. Stable 4 mm left lower lobe pulmonary nodule. 4. Aortic Atherosclerosis (ICD10-I70.0) and Emphysema (ICD10-J43.9). Electronically Signed   By: EMisty StanleyM.D.   On: 10/09/2020 13:15   MR Brain W Wo Contrast  Result Date: 11/02/2020 CLINICAL DATA:  Metastatic lung cancer post radiation, follow-up EXAM: MRI HEAD WITHOUT AND WITH CONTRAST TECHNIQUE: Multiplanar, multiecho pulse sequences of the brain and surrounding structures were obtained without and with intravenous contrast. CONTRAST:  25m MULTIHANCE GADOBENATE DIMEGLUMINE 529 MG/ML IV SOLN COMPARISON:  06/29/2020 FINDINGS: Brain: Metastatic lesions are identified and annotated on series 11: Increase in size of inferior left cerebellar lesion (image 33) measuring 6 mm (previously 4 mm); Stable lateral left cerebellar lesion (image 37) measuring 4  mm; Stable right cerebellar lesion (image 43) measuring 2 mm; Stable inferior left frontal operculum (image 92) measuring 2 mm; Stable inferior right frontal lobe (image 67) measuring 3 mm. No new mass or abnormal enhancement. There is no acute infarction or intracranial hemorrhage. Patchy foci of T2 hyperintensity in the supratentorial white matter nonspecific but may reflect stable chronic microvascular ischemic changes. There is no hydrocephalus or extra-axial fluid collection. Vascular: Major vessel flow voids at the skull base are preserved. Skull and upper cervical spine: Normal marrow signal is preserved. Sinuses/Orbits: Minor mucosal thickening.  Orbits are unremarkable. Other: Sella is unremarkable. Minor patchy mastoid fluid opacification. IMPRESSION: Increase in size of inferior left cerebellar lesion. Other lesions are stable. No new lesion. Electronically Signed   By: PMacy MisM.D.   On: 11/02/2020 09:51   CT Abdomen Pelvis W Contrast  Result Date: 10/09/2020 CLINICAL DATA:  Metastatic non-small cell lung cancer. EXAM: CT CHEST, ABDOMEN, AND PELVIS WITH CONTRAST TECHNIQUE: Multidetector CT imaging of the chest, abdomen and pelvis was performed following the standard protocol during bolus administration of intravenous contrast. CONTRAST:  761mOMNIPAQUE IOHEXOL 300 MG/ML  SOLN COMPARISON:  Chest CT 07/20/2020. Chest abdomen pelvis CT 05/16/2020 FINDINGS: CT CHEST FINDINGS Cardiovascular: The heart size is normal. No substantial pericardial effusion. Continued further mild progression of pericardial fluid, most prominently anteriorly along the right heart border Coronary artery calcification is evident. Atherosclerotic calcification is noted in the wall of the thoracic aorta. Mediastinum/Nodes: Stable soft tissue fullness in the subcarinal station without definite lymphadenopathy today. There is no hilar lymphadenopathy. The esophagus has normal imaging features. There is no axillary  lymphadenopathy. Lungs/Pleura: Centrilobular and paraseptal emphysema evident. Right parahilar scarring is stable in the interval compatible with treatment related fibrosis. Bullous change with pleuroparenchymal scarring posterior right apex is also stable in the interval. There is some mild volume loss in the right hemithorax, as before. No new suspicious pulmonary nodule or mass. 4 mm left lower lobe nodule on 65/7 is unchanged. Small right pleural effusion is new since 07/20/2020. Musculoskeletal: No worrisome lytic or sclerotic osseous abnormality. CT ABDOMEN PELVIS FINDINGS Hepatobiliary: No suspicious focal abnormality within the liver parenchyma. There is no evidence for gallstones, gallbladder wall thickening, or pericholecystic fluid. No intrahepatic or extrahepatic biliary dilation. Pancreas: No focal mass lesion. No dilatation of the main duct. No intraparenchymal cyst. No peripancreatic edema. Spleen: No splenomegaly. No focal mass lesion. Adrenals/Urinary Tract: No adrenal nodule or mass. Kidneys unremarkable. No evidence for hydroureter. The urinary bladder appears normal for the degree of distention. Stomach/Bowel: Stomach is nondistended. Duodenum is normally positioned as is the ligament of Treitz. No small bowel wall thickening. No small bowel dilatation. The terminal ileum is normal. The appendix is normal. No gross colonic mass. No colonic wall thickening. Vascular/Lymphatic: There is abdominal aortic atherosclerosis without aneurysm. There is no gastrohepatic or hepatoduodenal ligament lymphadenopathy. No retroperitoneal or mesenteric lymphadenopathy. No pelvic sidewall lymphadenopathy. Reproductive: The uterus is unremarkable.  There is no adnexal  mass. Other: No intraperitoneal free fluid. Musculoskeletal: No worrisome lytic or sclerotic osseous abnormality. IMPRESSION: 1. Stable appearance of post treatment changes in the right parahilar lung. New small right pleural effusion, but otherwise  no new or progressive findings to suggest definite recurrent or metastatic disease in the chest, abdomen, or pelvis. 2. Continued further mild progression of pericardial fluid, most prominently anteriorly along the right heart border. 3. Stable 4 mm left lower lobe pulmonary nodule. 4. Aortic Atherosclerosis (ICD10-I70.0) and Emphysema (ICD10-J43.9). Electronically Signed   By: Misty Stanley M.D.   On: 10/09/2020 13:15    Recent Labs: Lab Results  Component Value Date   WBC 6.9 11/06/2020   HGB 10.2 (L) 11/06/2020   PLT 454 (H) 11/06/2020   NA 139 11/06/2020   K 4.0 11/06/2020   CL 105 11/06/2020   CO2 26 11/06/2020   GLUCOSE 89 11/06/2020   BUN 14 11/06/2020   CREATININE 1.06 (H) 11/06/2020   BILITOT <0.2 (L) 11/06/2020   ALKPHOS 75 11/06/2020   AST 22 11/06/2020   ALT 7 11/06/2020   PROT 7.9 11/06/2020   ALBUMIN 2.9 (L) 11/06/2020   CALCIUM 10.6 (H) 11/06/2020   GFRAA >60 01/17/2020    Speciality Comments: PLQ eye exam: 01/19/2020 WNL @ [facility]. Follow up in 1 year.  Procedures:  No procedures performed Allergies: Patient has no known allergies.   Assessment / Plan:     Visit Diagnoses: Rheumatoid arthritis involving multiple sites with positive rheumatoid factor ([place]): She has no joint tenderness or synovitis on examination today.  She has not had any recent rheumatoid arthritis flares.  She is clinically doing well taking Plaquenil 200 mg 1 tablet by mouth twice daily Monday through Friday only.  She continues to tolerate Plaquenil without any side effects.  She has occasional stiffness in her right knee joint but had good range of motion with no warmth or effusion on examination today.  She has not had any nocturnal pain or difficulty with ADLs.  She will remain on Plaquenil as prescribed.  She does not require more aggressive immunosuppression at this time. She remains on maintenance systemic chemotherapy every 3 weeks and is followed closely by Dr. Earlie Server and   Vito Backers Heilingoetter, PA-C for management of  adenocarcinoma of right lung, stage IV.  She was advised to notify us if she develops increased joint pain or joint swelling.  She will follow-up in the office in 5 months.  High risk medication use: She is taking plaquenil 200 mg 1 tablet by mouth twice daily Monday through Friday only.  PLQ eye exam: 01/19/2020 WNL @ [facility]. Follow up in 1 year.  She was advised to schedule a Plaquenil eye exam in September 2022.  She was given a Plaquenil eye exam form to take with her to her next appointment.  CBC and CMP were updated yesterday at the cancer center and results were reviewed today in the office.  She will continue to have frequent lab monitoring with her oncologist.  DDD (degenerative disc disease), cervical: She has good ROM of the C-spine with no discomfort.  No symptoms of radiculopathy at this time.   Other medical conditions are listed as follows:   Adenocarcinoma of right lung, stage 3 ([place]): Metastatic non-small cell lung cancer initially diagnosed as stage IIIA. Followed by Dr. Julien Nordmann and Cassandra Heilingoetter, PA-C.  Current therapy includes systemic chemotherapy:  Alimta and Keytruda every 3 weeks.  She is currently receiving maintenance treatment-Her last infusion  was yesterday.   Acquired trigger finger of both middle fingers: Resolved   History of vitamin D deficiency: She is taking a vitamin D supplement daily.   Olecranon bursitis, right elbow: Resolved     Orders: No orders of the defined types were placed in this encounter.  No orders of the defined types were placed in this encounter.     Follow-Up Instructions: Return in about 5 months (around 04/09/2021) for Rheumatoid arthritis.   Ofilia Neas, PA-C  Note - This record has been created using Dragon software.  Chart creation errors have been sought, but may not always  have been located. Such creation errors do not reflect on  the standard of  medical care.

## 2020-11-09 ENCOUNTER — Ambulatory Visit (HOSPITAL_COMMUNITY): Payer: Medicare PPO | Attending: Cardiovascular Disease

## 2020-11-09 ENCOUNTER — Other Ambulatory Visit: Payer: Self-pay

## 2020-11-09 DIAGNOSIS — I313 Pericardial effusion (noninflammatory): Secondary | ICD-10-CM | POA: Insufficient documentation

## 2020-11-09 DIAGNOSIS — I3139 Other pericardial effusion (noninflammatory): Secondary | ICD-10-CM

## 2020-11-09 LAB — ECHOCARDIOGRAM COMPLETE
Area-P 1/2: 5.54 cm2
P 1/2 time: 422 ms
S' Lateral: 2.8 cm

## 2020-11-10 ENCOUNTER — Other Ambulatory Visit: Payer: Self-pay | Admitting: Physician Assistant

## 2020-11-10 DIAGNOSIS — M0579 Rheumatoid arthritis with rheumatoid factor of multiple sites without organ or systems involvement: Secondary | ICD-10-CM

## 2020-11-10 DIAGNOSIS — C3490 Malignant neoplasm of unspecified part of unspecified bronchus or lung: Secondary | ICD-10-CM | POA: Diagnosis not present

## 2020-11-12 ENCOUNTER — Telehealth: Payer: Self-pay | Admitting: Cardiology

## 2020-11-12 NOTE — Telephone Encounter (Signed)
Last Visit: 11/07/2020 Next Visit: 04/10/2021 Labs: 11/06/2020, RBC 3.06, Hemoglobin 10.2, HCT 31.0, MCV 101.3, Platelet Count 454, Creatinine 1.06, Calcium 10.6, Albumin 2.9, Total Bilirubin <0.2, GFR 58 Eye exam: 01/19/2020  WNL  Current Dose per office note 11/07/2020: plaquenil 200 mg 1 tablet by mouth twice daily Monday through Friday only  DX: Rheumatoid arthritis involving multiple sites with positive rheumatoid factor  Last Fill: 08/14/2020  Okay to refill Plaquenil?

## 2020-11-12 NOTE — Telephone Encounter (Signed)
Patient was calling to get the results from her Echo done Friday 11/09/20

## 2020-11-13 NOTE — Telephone Encounter (Signed)
Pt wants to know if any treatment is needed as she has had this for a while? She has an appt next week.

## 2020-11-13 NOTE — Telephone Encounter (Signed)
Recommendations reviewed with pt as per Dr. Julien Nordmann note.  Pt verbalized understanding and had no additional questions.

## 2020-11-17 ENCOUNTER — Other Ambulatory Visit: Payer: Self-pay | Admitting: Physician Assistant

## 2020-11-17 DIAGNOSIS — M0579 Rheumatoid arthritis with rheumatoid factor of multiple sites without organ or systems involvement: Secondary | ICD-10-CM

## 2020-11-21 ENCOUNTER — Encounter: Payer: Self-pay | Admitting: Cardiology

## 2020-11-21 ENCOUNTER — Other Ambulatory Visit: Payer: Self-pay

## 2020-11-21 ENCOUNTER — Ambulatory Visit (INDEPENDENT_AMBULATORY_CARE_PROVIDER_SITE_OTHER): Payer: Medicare PPO | Admitting: Cardiology

## 2020-11-21 VITALS — BP 104/56 | HR 120 | Ht 60.0 in | Wt 107.0 lb

## 2020-11-21 DIAGNOSIS — I3139 Other pericardial effusion (noninflammatory): Secondary | ICD-10-CM

## 2020-11-21 DIAGNOSIS — C3491 Malignant neoplasm of unspecified part of right bronchus or lung: Secondary | ICD-10-CM | POA: Diagnosis not present

## 2020-11-21 DIAGNOSIS — I313 Pericardial effusion (noninflammatory): Secondary | ICD-10-CM

## 2020-11-21 DIAGNOSIS — I2510 Atherosclerotic heart disease of native coronary artery without angina pectoris: Secondary | ICD-10-CM

## 2020-11-21 DIAGNOSIS — I700 Atherosclerosis of aorta: Secondary | ICD-10-CM | POA: Diagnosis not present

## 2020-11-21 NOTE — Patient Instructions (Signed)
Medication Instructions:  No medication changes. *If you need a refill on your cardiac medications before your next appointment, please call your pharmacy*   Lab Work: None ordered If you have labs (blood work) drawn today and your tests are completely normal, you will receive your results only by: [place] (if you have MyChart) OR A paper copy in the mail If you have any lab test that is abnormal or we need to change your treatment, we will call you to review the results.   Testing/Procedures: None ordered   Follow-Up: At [facility], you and your health needs are our priority.  As part of our continuing mission to provide you with exceptional heart care, we have created designated Provider Care Teams.  These Care Teams include your primary Cardiologist (physician) and Advanced Practice Providers (APPs -  Physician Assistants and Nurse Practitioners) who all work together to provide you with the care you need, when you need it.  We recommend signing up for the patient portal called "MyChart".  Sign up information is provided on this After Visit Summary.  MyChart is used to connect with patients for Virtual Visits (Telemedicine).  Patients are able to view lab/test results, encounter notes, upcoming appointments, etc.  Non-urgent messages can be sent to your provider as well.   To learn more about what you can do with MyChart, go to [email or web address].    Your next appointment:   2 month(s)  The format for your next appointment:   In Person  Provider:   Jyl Heinz, MD   Other Instructions NA

## 2020-11-21 NOTE — Progress Notes (Signed)
Cardiology Office Note:    Date:  11/21/2020   ID:  Susan Holder, DOB 05-Nov-1955, MRN [id number]  PCP:  [name], DO  Cardiologist:  Jenean Lindau, MD   Referring MD: [name], DO    ASSESSMENT:    1. Aortic atherosclerosis ([place])   2. Coronary artery calcification seen on CT scan   3. Pericardial effusion   4. Adenocarcinoma of right lung, stage 4 (HCC)    PLAN:    In order of problems listed above:  Non-small cell lung cancer with metastasis.  Followed by oncologist.  Patient mentions to me that her cancer is stable at this time. Pericardial effusion: This is significant by recent echocardiogram.  Overall patient has a good quality of life in spite of significant challenges with cancer.  I told her about the concerns of the current pericardial effusion and that I will refer her to our cardiothoracic colleagues for review of her echocardiogram and to see if she is a candidate for a pericardial tap.  This might help her in terms of her effort tolerance and also her resting tachycardia.  I also believe it might be beneficial to her in view of the fact that the pericardial effusion has increased and that may be detrimental to her in the coming several weeks.  I discussed this with her and we will set up a consultation with our cardiothoracic surgical colleagues for further assessment and input. Artery calcification: Stable at this time.  Medical management Patient will be seen in follow-up appointment in 2 months or earlier if the patient has any concerns    Medication Adjustments/Labs and Tests Ordered: Current medicines are reviewed at length with the patient today.  Concerns regarding medicines are outlined above.  No orders of the defined types were placed in this encounter.  No orders of the defined types were placed in this encounter.    No chief complaint on file.    History of Present Illness:    Susan Holder is a 65 y.o. female.  Patient  is a very pleasant lady.  She has history of coronary artery calcifications, non-small cell lung cancer with metastasis.  She mentions to me that she has received therapy and that her cancer is steady.  Echocardiogram done has revealed significant decrease in pericardial effusion.  She denies any chest pain orthopnea or PND.  She takes care of activities of daily living.  When she does a little more than usual she feels fatigued.  Past Medical History:  Diagnosis Date   Acid reflux 09/21/2018   Adenocarcinoma of right lung, stage 4 ([place]) 11/17/2017   Allergic rhinitis 08/22/2013   ANA positive 05/29/2016   Aortic atherosclerosis ([place]) 10/23/2020   Brain metastases ([place]) 03/29/2019   Coronary artery calcification seen on CT scan 10/23/2020   DDD (degenerative disc disease), cervical 05/29/2016   Encounter for antineoplastic chemotherapy 11/17/2017   Encounter for antineoplastic immunotherapy 02/01/2018   Goals of care, counseling/discussion 11/17/2017   High risk medication use 05/29/2016   04/29/2016: ==> plq 200 am & 100 qhs(adeq response).   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   Other fatigue 05/29/2016   Pericardial effusion 10/23/2020   Primary malignant neoplasm of bronchus of right lower lobe ([place]) 12/21/2017   Rheumatoid arthritis ([place])  Rheumatoid arthritis involving multiple sites with positive rheumatoid factor (HCC) 05/29/2016   +RF +ANA +CCP    Smoker 05/29/2016   Trigger finger, left ring finger 05/29/2016   Trigger finger, right ring finger 05/29/2016   Vitamin D deficiency 05/29/2016    Past Surgical History:  Procedure Laterality Date   BRONCHIAL NEEDLE ASPIRATION BIOPSY  11/09/2017   Procedure: BRONCHIAL NEEDLE ASPIRATION BIOPSIES;  Surgeon: Marshell Garfinkel, MD;  Location: [facility];  Service: Cardiopulmonary;;   ENDOBRONCHIAL ULTRASOUND Bilateral  11/09/2017   Procedure: ENDOBRONCHIAL ULTRASOUND;  Surgeon: Marshell Garfinkel, MD;  Location: [facility];  Service: Cardiopulmonary;  Laterality: Bilateral;   NASAL SINUS SURGERY     NASAL SINUS SURGERY     TUBAL LIGATION     VIDEO BRONCHOSCOPY  11/09/2017   Procedure: VIDEO BRONCHOSCOPY;  Surgeon: Marshell Garfinkel, MD;  Location: [facility];  Service: Cardiopulmonary;;    Current Medications: Current Meds  Medication Sig   diclofenac sodium (VOLTAREN) 1 % GEL Apply 2 g topically 4 (four) times daily.   Fluocinolone Acetonide Scalp 0.01 % OIL Apply 1 application topically daily.   folic acid (FOLVITE) 1 MG tablet Take 1 mg by mouth daily.   hydroxychloroquine (PLAQUENIL) 200 MG tablet TAKE 1 TABLET BY MOUTH TWICE DAILY MONDAY THROUGH FRIDAY ONLY.   omeprazole (PRILOSEC) 20 MG capsule Take 20 mg by mouth daily.   prochlorperazine (COMPAZINE) 10 MG tablet Take 10 mg by mouth every 6 (six) hours as needed for nausea/vomiting.   vitamin C (ASCORBIC ACID) 500 MG tablet Take 500 mg by mouth daily.   VITAMIN D PO Take 1 tablet by mouth daily.     Allergies:   Patient has no known allergies.   Social History   Socioeconomic History   Marital status: Divorced    Spouse name: Not on file   Number of children: Not on file   Years of education: Not on file   Highest education level: Not on file  Occupational History   Not on file  Tobacco Use   Smoking status: Former    Packs/day: 0.50    Years: 45.00    Pack years: 22.50    Types: Cigarettes    Quit date: 09/29/2017    Years since quitting: 3.1   Smokeless tobacco: Never  Vaping Use   Vaping Use: Never used  Substance and Sexual Activity   Alcohol use: Not Currently   Drug use: No   Sexual activity: Not Currently  Other Topics Concern   Not on file  Social History Narrative   Not on file   Social Determinants of Health   Financial Resource Strain: Low Risk    Difficulty of Paying Living Expenses: Not hard at all  Food  Insecurity: No Food Insecurity   Worried About Charity fundraiser in the Last Year: Never true   [place] in the Last Year: Never true  Transportation Needs: No Transportation Needs   Lack of Transportation (Medical): No   Lack of Transportation (Non-Medical): No  Physical Activity: Inactive   Days of Exercise per Week: 0 days   Minutes of Exercise per Session: 0 min  Stress: No Stress Concern Present   Feeling of Stress : Not at all  Social Connections: Moderately Isolated   Frequency of Communication with Friends and Family: More than three times a week   Frequency of Social Gatherings with Friends and Family: Twice a week   Attends Religious Services: More than 4 times per year  Active Member of Clubs or Organizations: No   Attends Archivist Meetings: Never   Marital Status: Divorced     Family History: The patient's family history includes Alzheimer's disease in her mother; Emphysema in her father; Heart attack in her maternal aunt; Heart disease in her mother; Heart failure in her maternal grandmother; Hypertension in her brother and paternal grandmother; Stroke in her mother.  ROS:   Please see the history of present illness.    All other systems reviewed and are negative.  EKGs/Labs/Other Studies Reviewed:    The following studies were reviewed today: I discussed findings on echocardiogram with the patient at length.   Recent Labs: 11/06/2020: ALT 7; BUN 14; Creatinine 1.06; Hemoglobin 10.2; Platelet Count 454; Potassium 4.0; Sodium 139; TSH 1.734  Recent Lipid Panel No results found for: CHOL, TRIG, HDL, CHOLHDL, VLDL, LDLCALC, LDLDIRECT  Physical Exam:    VS:  BP (!) 104/56   Pulse (!) 120   Ht 5' (1.524 m)   Wt 107 lb (48.5 kg)   LMP 04/28/2000   SpO2 96%   BMI 20.90 kg/m     Wt Readings from Last 3 Encounters:  11/21/20 107 lb (48.5 kg)  11/07/20 109 lb 12.8 oz (49.8 kg)  11/06/20 108 lb 9.6 oz (49.3 kg)     GEN: Patient is in no  acute distress HEENT: Normal NECK: No JVD; No carotid bruits LYMPHATICS: No lymphadenopathy CARDIAC: Hear sounds regular, 2/6 systolic murmur at the apex. RESPIRATORY:  Clear to auscultation without rales, wheezing or rhonchi  ABDOMEN: Soft, non-tender, non-distended MUSCULOSKELETAL:  No edema; No deformity  SKIN: Warm and dry NEUROLOGIC:  Alert and oriented x 3 PSYCHIATRIC:  Normal affect   Signed, Jenean Lindau, MD  11/21/2020 3:29 PM    [facility]

## 2020-11-22 NOTE — Progress Notes (Addendum)
[facility] OFFICE PROGRESS NOTE  [name], DO [place] [address]  DIAGNOSIS:  Metastatic non-small cell lung cancer initially diagnosed as stage IIIA (T3, N1/N2, M0) non-small cell lung cancer, adenocarcinoma presented with large right lower lobe lung mass with extension to the right hilum and subcarinal area diagnosed in July 2019.  She has brain metastasis in October 2020.   Biomarker Findings Tumor Mutational Burden - TMB-Intermediate (6 Muts/Mb) Microsatellite status - MS-Stable Genomic Findings For a complete list of the genes assayed, please refer to the Appendix. NRAS Q61R ARAF amplification STK11 G56W KRAS G13D MYCN amplification MCL1 amplification NKX2-1 amplification - equivocal? TP53 G245V 7 Disease relevant genes with no reportable alterations: EGFR, ALK, BRAF, MET, ERBB2, RET, ROS1   PRIOR THERAPY: 1) Course of concurrent chemoradiation with weekly carboplatin for AUC of 2 and paclitaxel 45 mg/M2.  Status post 7 cycles.  Last dose was giving 01/11/2018. 2) Consolidation treatment with immunotherapy with Imfinzi (Durvalumab) 10 mg/KG every 2 weeks.  First dose February 09, 2018.  Status post 19 cycles. 3) status post stereotactic body radiotherapy to the enlarging right supraclavicular lymphadenopathy under the care of Dr. Lisbeth Renshaw. 4) SRS to multiple brain metastasis under the care of Dr. Lisbeth Renshaw.  CURRENT THERAPY: Systemic chemotherapy with carboplatin for AUC of 5, Alimta 500 mg/M2 and Keytruda 200 mg IV every 3 weeks.  First dose 08/16/2019.  Status post 22 cycles.  Starting from cycle #5 the patient is on maintenance treatment with Alimta and Keytruda every 3 weeks. Alimta was reduced to 400 mg/m2 starting from cycle #21.  INTERVAL HISTORY: Susan Holder 65 y.o. female returns to the clinic today for a follow-up visit.  The patient is feeling fairly well today without any concerning complaints.  In the interval since  her last appointment, she saw her cardiologist for her pericardial effusion and ongoing sinus tachycardia.  Her cardiologist referred her to cardiothoracic surgery to see if they would consider her for pericardial tap.  She has an appointment with cardiothoracic surgeon, Dr. Kipp Brood, on Monday 12/03/20.  Still fatigue. Eat same. Lost some things can't eat like bread. Has appetite but certain things can't eat.   Otherwise regarding her treatment for lung cancer, the patient has been stable.  She denies any new concerning complaints. She continues to report fatigue.  She denies any fever, chills, or night sweats. She lost weight due to her dry mouth which has altered her diet. For example, she cannot chew bread due to her dry mouth.  She denies any chest pain or hemoptysis.  She denies any significant short of breath with exertion unless with climbing stairs.  She has a baseline dry cough which has been similar for several years.  She denies any nausea or vomiting unless some nausea 1 week following treatment which is controlled with compazine. She denies any diarrhea or constipation. She is followed by radiation oncology for history of metastatic disease to the brain.  She denies any rashes or skin changes except for dry skin.  She is here today for evaluation and repeat blood work before starting cycle #23.   MEDICAL HISTORY: Past Medical History:  Diagnosis Date   Acid reflux 09/21/2018   Adenocarcinoma of right lung, stage 4 ([facility]) 11/17/2017   Allergic rhinitis 08/22/2013   ANA positive 05/29/2016   Aortic atherosclerosis ([facility]) 10/23/2020   Brain metastases ([facility]) 03/29/2019   Coronary artery calcification seen on CT scan 10/23/2020   DDD (degenerative disc  disease), cervical 05/29/2016   Encounter for antineoplastic chemotherapy 11/17/2017   Encounter for antineoplastic immunotherapy 02/01/2018   Goals of care, counseling/discussion 11/17/2017   High risk medication use 05/29/2016   04/29/2016: ==> plq 200  am & 100 qhs(adeq response).   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[facility]) 11/04/2018   Other fatigue 05/29/2016   Pericardial effusion 10/23/2020   Primary malignant neoplasm of bronchus of right lower lobe ([facility]) 12/21/2017   Rheumatoid arthritis (HCC)    Rheumatoid arthritis involving multiple sites with positive rheumatoid factor ([facility]) 05/29/2016   +RF +ANA +CCP    Smoker 05/29/2016   Trigger finger, left ring finger 05/29/2016   Trigger finger, right ring finger 05/29/2016   Vitamin D deficiency 05/29/2016    ALLERGIES:  has No Known Allergies.  MEDICATIONS:  Current Outpatient Medications  Medication Sig Dispense Refill   diclofenac sodium (VOLTAREN) 1 % GEL Apply 2 g topically 4 (four) times daily.     Fluocinolone Acetonide Scalp 0.01 % OIL Apply 1 application topically daily.     folic acid (FOLVITE) 1 MG tablet Take 1 mg by mouth daily.     hydroxychloroquine (PLAQUENIL) 200 MG tablet TAKE 1 TABLET BY MOUTH TWICE DAILY MONDAY THROUGH FRIDAY ONLY. 120 tablet 0   omeprazole (PRILOSEC) 20 MG capsule Take 20 mg by mouth daily.     prochlorperazine (COMPAZINE) 10 MG tablet Take 10 mg by mouth every 6 (six) hours as needed for nausea/vomiting.     vitamin C (ASCORBIC ACID) 500 MG tablet Take 500 mg by mouth daily.     VITAMIN D PO Take 1 tablet by mouth daily.     No current facility-administered medications for this visit.   Facility-Administered Medications Ordered in Other Visits  Medication Dose Route Frequency Provider Last Rate Last Admin   pembrolizumab (KEYTRUDA) 200 mg in sodium chloride 0.9 % 50 mL chemo infusion  200 mg Intravenous Once Curt Bears, MD       PEMEtrexed (ALIMTA) 600 mg in sodium chloride 0.9 % 100 mL chemo infusion  600 mg Intravenous Once Curt Bears, MD        SURGICAL HISTORY:  Past Surgical History:  Procedure Laterality Date    BRONCHIAL NEEDLE ASPIRATION BIOPSY  11/09/2017   Procedure: BRONCHIAL NEEDLE ASPIRATION BIOPSIES;  Surgeon: Marshell Garfinkel, MD;  Location: [facility];  Service: Cardiopulmonary;;   ENDOBRONCHIAL ULTRASOUND Bilateral 11/09/2017   Procedure: ENDOBRONCHIAL ULTRASOUND;  Surgeon: Marshell Garfinkel, MD;  Location: [facility];  Service: Cardiopulmonary;  Laterality: Bilateral;   NASAL SINUS SURGERY     NASAL SINUS SURGERY     TUBAL LIGATION     VIDEO BRONCHOSCOPY  11/09/2017   Procedure: VIDEO BRONCHOSCOPY;  Surgeon: Marshell Garfinkel, MD;  Location: [facility];  Service: Cardiopulmonary;;    REVIEW OF SYSTEMS:   Constitutional: Positive for fatigue. Negative for appetite change, chills, fever and unexpected weight change. HENT: Positive for dry mouth. Negative for mouth sores, nosebleeds, sore throat and trouble swallowing.   Eyes: Negative for eye problems and icterus. Respiratory: Positive for baseline cough. Negative for hemoptysis, shortness of breath (unless climbing stairs) and wheezing.   Cardiovascular: Negative for chest pain and leg swelling. Gastrointestinal: Positive for occasional nausea and vomiting. Negative for abdominal pain, constipation, or diarrhea.. Genitourinary: Negative for bladder incontinence, difficulty urinating, dysuria, frequency and hematuria.  Musculoskeletal: Negative for back pain, gait problem, neck pain and neck stiffness. Skin: Negative for itching and rash. Neurological: Negative for dizziness, extremity weakness, gait problem, headaches, light-headedness and seizures. Hematological: Negative for adenopathy. Does not bruise/bleed easily. Psychiatric/Behavioral: Negative for confusion, depression and sleep disturbance. The patient is not nervous/anxious.   PHYSICAL EXAMINATION:  Blood pressure 138/64, pulse (!) 120, temperature (!) 97.3 F (36.3 C), temperature source Tympanic, resp. rate 18, height 5' (1.524 m), weight 105 lb 11.2 oz (47.9 kg), last  menstrual period 04/28/2000, SpO2 100 %.  ECOG PERFORMANCE STATUS: 1  Physical Exam  Constitutional: Oriented to person, place, and time and well-developed, well-nourished, and in no distress. HENT: Head: Normocephalic and atraumatic. Mouth/Throat: Oropharynx is clear and moist. No oropharyngeal exudate. Eyes: Conjunctivae are normal. Right eye exhibits no discharge. Left eye exhibits no discharge. No scleral icterus. Neck: Normal range of motion. Neck supple. Cardiovascular: Tachycardic, regular rhythm, normal heart sounds and intact distal pulses.   Pulmonary/Chest: Effort normal and breath sounds normal. No respiratory distress. No wheezes. No rales. Abdominal: Soft. Bowel sounds are normal. Exhibits no distension and no mass. There is no tenderness.  Musculoskeletal: Normal range of motion. Exhibits no edema.  Lymphadenopathy:    No cervical adenopathy.  Neurological: Alert and oriented to person, place, and time. Exhibits normal muscle tone. Gait normal. Coordination normal. Skin: Skin is warm and dry. No rash noted. Not diaphoretic. No erythema. No pallor.  Psychiatric: Mood, memory and judgment normal. Vitals reviewed  LABORATORY DATA: Lab Results  Component Value Date   WBC 7.7 11/27/2020   HGB 9.9 (L) 11/27/2020   HCT 30.0 (L) 11/27/2020   MCV 100.7 (H) 11/27/2020   PLT 466 (H) 11/27/2020      Chemistry      Component Value Date/Time   NA 140 11/27/2020 1112   K 3.7 11/27/2020 1112   CL 104 11/27/2020 1112   CO2 25 11/27/2020 1112   BUN 11 11/27/2020 1112   CREATININE 1.19 (H) 11/27/2020 1112   CREATININE 0.80 07/24/2017 1438      Component Value Date/Time   CALCIUM 10.8 (H) 11/27/2020 1112   ALKPHOS 75 11/27/2020 1112   AST 23 11/27/2020 1112   ALT 7 11/27/2020 1112   BILITOT <0.2 (L) 11/27/2020 1112       RADIOGRAPHIC STUDIES:  MR Brain W Wo Contrast  Result Date: 11/02/2020 CLINICAL DATA:  Metastatic lung cancer post radiation, follow-up EXAM: MRI  HEAD WITHOUT AND WITH CONTRAST TECHNIQUE: Multiplanar, multiecho pulse sequences of the brain and surrounding structures were obtained without and with intravenous contrast. CONTRAST:  35m MULTIHANCE GADOBENATE DIMEGLUMINE 529 MG/ML IV SOLN COMPARISON:  06/29/2020 FINDINGS: Brain: Metastatic lesions are identified and annotated on series 11: Increase in size of inferior left cerebellar lesion (image 33) measuring 6 mm (previously 4 mm); Stable lateral left cerebellar lesion (image 37) measuring 4 mm; Stable right cerebellar lesion (image 43) measuring 2 mm; Stable inferior left frontal operculum (image 92) measuring 2 mm; Stable inferior right frontal lobe (image 67) measuring 3 mm. No new mass or abnormal enhancement. There is no acute infarction or intracranial hemorrhage. Patchy foci of T2 hyperintensity in the supratentorial white matter nonspecific but may reflect stable chronic microvascular ischemic changes. There is no hydrocephalus or extra-axial fluid collection. Vascular: Major vessel flow voids at the skull base are preserved. Skull and upper cervical spine: Normal marrow signal is preserved. Sinuses/Orbits: Minor mucosal thickening.  Orbits are unremarkable. Other: Sella is unremarkable. Minor patchy mastoid  fluid opacification. IMPRESSION: Increase in size of inferior left cerebellar lesion. Other lesions are stable. No new lesion. Electronically Signed   By: Macy Mis M.D.   On: 11/02/2020 09:51   ECHOCARDIOGRAM COMPLETE  Result Date: 11/09/2020    ECHOCARDIOGRAM REPORT   Patient Name:   Susan Holder Date of Exam: 11/09/2020 Medical Rec #:  [id number]         Height:       60.0 in Accession #:    [id number]        Weight:       109.8 lb Date of Birth:  1955-10-09          BSA:          1.447 m Patient Age:    55 years          BP:           102/72 mmHg Patient Gender: F                 HR:           117 bpm. Exam Location:  [facility] Procedure: 2D Echo, 3D Echo, Cardiac Doppler and  Color Doppler Indications:    I31.3 Pericardial effusion  History:        Patient has no prior history of Echocardiogram examinations.                 Arrythmias:PVC; Risk Factors:Former Smoker. Pericardial                 effusion. Lung cancer. Brain metastasis.  Sonographer:    Jessee Avers, RDCS Referring Phys: Drew  1. Left ventricular ejection fraction, by estimation, is 40 to 45%. The left ventricle has mildly decreased function. The left ventricle demonstrates global hypokinesis. The left ventricular internal cavity size was mildly dilated. Left ventricular diastolic parameters are consistent with Grade I diastolic dysfunction (impaired relaxation).  2. Right ventricular systolic function is moderately reduced. The right ventricular size is normal. There is normal pulmonary artery systolic pressure.  3. There is a moderate circumferential pericardial effusion. There is no collapse of the RV or RA. The MV inflow pattern is normal. There is no evidence of pericardial tamponade but I would recommend close clinical follow up for further evaluation . Marland Kitchen Moderate pericardial effusion. The pericardial effusion is circumferential. There is no evidence of cardiac tamponade.  4. The mitral valve is grossly normal. No evidence of mitral valve regurgitation.  5. The aortic valve is grossly normal. Aortic valve regurgitation is moderate.  6. The IVC is normal size. There is inspiratory collapse of the IVC. Marland Kitchen The inferior vena cava is normal in size with greater than 50% respiratory variability, suggesting right atrial pressure of 3 mmHg. FINDINGS  Left Ventricle: Left ventricular ejection fraction, by estimation, is 40 to 45%. The left ventricle has mildly decreased function. The left ventricle demonstrates global hypokinesis. The left ventricular internal cavity size was mildly dilated. There is  no left ventricular hypertrophy. Left ventricular diastolic parameters are consistent with Grade  I diastolic dysfunction (impaired relaxation). Right Ventricle: The right ventricular size is normal. Right vetricular wall thickness was not well visualized. Right ventricular systolic function is moderately reduced. There is normal pulmonary artery systolic pressure. The tricuspid regurgitant velocity is 2.41 m/s, and with an assumed right atrial pressure of 3 mmHg, the estimated right ventricular systolic pressure is 69.6 mmHg. Left Atrium: Left atrial size was normal in size.  Right Atrium: Right atrial size was normal in size. Pericardium: There is a moderate circumferential pericardial effusion. There is no collapse of the RV or RA. The MV inflow pattern is normal. There is no evidence of pericardial tamponade but I would recommend close clinical follow up for further evaluation. A moderately sized pericardial effusion is present. The pericardial effusion is circumferential. There is no evidence of cardiac tamponade. Mitral Valve: The mitral valve is grossly normal. No evidence of mitral valve regurgitation. Tricuspid Valve: The tricuspid valve is grossly normal. Tricuspid valve regurgitation is not demonstrated. Aortic Valve: The aortic valve is grossly normal. Aortic valve regurgitation is moderate. Aortic regurgitation PHT measures 422 msec. Pulmonic Valve: The pulmonic valve was normal in structure. Pulmonic valve regurgitation is not visualized. Aorta: The aortic root and ascending aorta are structurally normal, with no evidence of dilitation. Venous: The IVC is normal size. There is inspiratory collapse of the IVC. The inferior vena cava is normal in size with greater than 50% respiratory variability, suggesting right atrial pressure of 3 mmHg. IAS/Shunts: The atrial septum is grossly normal.  LEFT VENTRICLE PLAX 2D LVIDd:         3.80 cm  Diastology LVIDs:         2.80 cm  LV e' medial:    4.57 cm/s LV PW:         0.70 cm  LV E/e' medial:  8.2 LV IVS:        0.70 cm  LV e' lateral:   4.68 cm/s LVOT diam:      2.10 cm  LV E/e' lateral: 8.1 LV SV:         53 LV SV Index:   36       2D Longitudinal Strain LVOT Area:     3.46 cm 2D Strain GLS (A2C):   -13.7 %                         2D Strain GLS (A3C):   -16.1 %                         2D Strain GLS (A4C):   -19.8 %                         2D Strain GLS Avg:     -16.5 %                          3D Volume EF:                         3D EF:        52 %                         LV EDV:       92 ml                         LV ESV:       44 ml                         LV SV:        47 ml RIGHT VENTRICLE RV Basal diam:  2.60 cm RV S prime:     14.90 cm/s TAPSE (  M-mode): 1.1 cm RVSP:           26.2 mmHg LEFT ATRIUM             Index      RIGHT ATRIUM           Index LA diam:        2.40 cm 1.66 cm/m RA Pressure: 3.00 mmHg LA Vol (A2C):   9.8 ml  6.80 ml/m RA Area:     8.09 cm LA Vol (A4C):   10.4 ml 7.19 ml/m RA Volume:   15.10 ml  10.44 ml/m LA Biplane Vol: 10.2 ml 7.05 ml/m  AORTIC VALVE LVOT Vmax:   98.60 cm/s LVOT Vmean:  69.600 cm/s LVOT VTI:    0.152 m AI PHT:      422 msec  AORTA Ao Root diam: 2.80 cm Ao Asc diam:  3.20 cm MITRAL VALVE               TRICUSPID VALVE                            TR Peak grad:   23.2 mmHg                            TR Vmax:        241.00 cm/s MV E velocity: 37.70 cm/s  Estimated RAP:  3.00 mmHg MV A velocity: 72.80 cm/s  RVSP:           26.2 mmHg MV E/A ratio:  0.52                            SHUNTS                            Systemic VTI:  0.15 m                            Systemic Diam: 2.10 cm Mertie Moores MD Electronically signed by Mertie Moores MD Signature Date/Time: 11/09/2020/4:55:43 PM    Final      ASSESSMENT/PLAN:  This is a very pleasant 65 year old African American female with metastatic non-small cell lung cancer, adenocarcinoma initially diagnosed as IIIA in July 2019. She had evidence of metastatic disease with brain metastases in October 2020. She has no actionable mutations.    She is status post 7 cycles of  concurrent chemoradiation with carboplatin for an AUC of 2 and paclitaxel 45 mg/m2. She had a partial response.   She then underwent consolidation immunotherapy with imfinzi. She is status post 18 cycles.   She completed SBRT to the right supraclavicular lymph node.   She had evidence of multiple brain metastases in October 2020. She underwent SRS treatment to these lesions under the care of Dr. Lisbeth Renshaw.   She currently is undergoing systemic chemotherapy with carboplatin for an AUC of 5, Alimta 500 mg/m2 and Keytruda 200 mg IV every 3 weeks. She is status post 22 cycles. Starting from cycle #5 she started maintenance Alimta and Keytruda. Starting from cycle #21, Dr. Julien Nordmann reduced her alimta to 400 mg/m2.   I reviewed her alimta dose with Dr. Julien Nordmann today. He will continue to reduce her dose to 400 mg/m2 due to fatigue.   Labs reviewed.  Recommend that she  proceed with cycle #23 today scheduled.  We will see her back for follow-up visit in 3 weeks for evaluation before starting cycle #24.  She will follow up with Dr. Kipp Brood next week as scheduled for her pericardial effusion.   She will continue to take Compazine if needed for nausea.  We will consider arranging for a restaging CT scan after her next appointment.  Her calcium is a little high. She denies calcium supplements, multivitamins with calcium, or excessive dairy intake. Advised to reduce calcium rich foods until next lab draw. Will continue to monitor.   The patient was advised to call immediately if she has any concerning symptoms in the interval. The patient voices understanding of current disease status and treatment options and is in agreement with the current care plan. All questions were answered. The patient knows to call the clinic with any problems, questions or concerns. We can certainly see the patient much sooner if necessary        Orders Placed This Encounter  Procedures   CBC with Differential ([facility] Only)    Standing Status:   Standing    Number of Occurrences:   10    Standing Expiration Date:   11/27/2021   CMP ([facility] only)    Standing Status:   Standing    Number of Occurrences:   10    Standing Expiration Date:   11/27/2021      The total time spent in the appointment was 20-29 minutes  Lexa Coronado L Oney Folz, PA-C 11/27/20

## 2020-11-26 ENCOUNTER — Other Ambulatory Visit: Payer: Self-pay | Admitting: Medical Oncology

## 2020-11-26 DIAGNOSIS — C3491 Malignant neoplasm of unspecified part of right bronchus or lung: Secondary | ICD-10-CM

## 2020-11-27 ENCOUNTER — Inpatient Hospital Stay: Payer: Medicare PPO

## 2020-11-27 ENCOUNTER — Other Ambulatory Visit: Payer: Self-pay

## 2020-11-27 ENCOUNTER — Encounter: Payer: Self-pay | Admitting: Physician Assistant

## 2020-11-27 ENCOUNTER — Inpatient Hospital Stay: Payer: Medicare PPO | Attending: Internal Medicine | Admitting: Physician Assistant

## 2020-11-27 VITALS — BP 138/64 | HR 120 | Temp 97.3°F | Resp 18 | Ht 60.0 in | Wt 105.7 lb

## 2020-11-27 VITALS — HR 108

## 2020-11-27 DIAGNOSIS — R000 Tachycardia, unspecified: Secondary | ICD-10-CM | POA: Diagnosis not present

## 2020-11-27 DIAGNOSIS — Z5111 Encounter for antineoplastic chemotherapy: Secondary | ICD-10-CM

## 2020-11-27 DIAGNOSIS — Z87891 Personal history of nicotine dependence: Secondary | ICD-10-CM | POA: Diagnosis not present

## 2020-11-27 DIAGNOSIS — M5030 Other cervical disc degeneration, unspecified cervical region: Secondary | ICD-10-CM | POA: Insufficient documentation

## 2020-11-27 DIAGNOSIS — E559 Vitamin D deficiency, unspecified: Secondary | ICD-10-CM | POA: Insufficient documentation

## 2020-11-27 DIAGNOSIS — C7931 Secondary malignant neoplasm of brain: Secondary | ICD-10-CM | POA: Insufficient documentation

## 2020-11-27 DIAGNOSIS — Z923 Personal history of irradiation: Secondary | ICD-10-CM | POA: Diagnosis not present

## 2020-11-27 DIAGNOSIS — C3491 Malignant neoplasm of unspecified part of right bronchus or lung: Secondary | ICD-10-CM | POA: Diagnosis not present

## 2020-11-27 DIAGNOSIS — I700 Atherosclerosis of aorta: Secondary | ICD-10-CM | POA: Diagnosis not present

## 2020-11-27 DIAGNOSIS — C770 Secondary and unspecified malignant neoplasm of lymph nodes of head, face and neck: Secondary | ICD-10-CM | POA: Diagnosis not present

## 2020-11-27 DIAGNOSIS — Z5112 Encounter for antineoplastic immunotherapy: Secondary | ICD-10-CM | POA: Diagnosis not present

## 2020-11-27 DIAGNOSIS — C3431 Malignant neoplasm of lower lobe, right bronchus or lung: Secondary | ICD-10-CM | POA: Insufficient documentation

## 2020-11-27 DIAGNOSIS — I313 Pericardial effusion (noninflammatory): Secondary | ICD-10-CM | POA: Insufficient documentation

## 2020-11-27 DIAGNOSIS — M069 Rheumatoid arthritis, unspecified: Secondary | ICD-10-CM | POA: Insufficient documentation

## 2020-11-27 DIAGNOSIS — Z79899 Other long term (current) drug therapy: Secondary | ICD-10-CM | POA: Insufficient documentation

## 2020-11-27 LAB — CMP (CANCER CENTER ONLY)
ALT: 7 U/L (ref 0–44)
AST: 23 U/L (ref 15–41)
Albumin: 2.8 g/dL — ABNORMAL LOW (ref 3.5–5.0)
Alkaline Phosphatase: 75 U/L (ref 38–126)
Anion gap: 11 (ref 5–15)
BUN: 11 mg/dL (ref 8–23)
CO2: 25 mmol/L (ref 22–32)
Calcium: 10.8 mg/dL — ABNORMAL HIGH (ref 8.9–10.3)
Chloride: 104 mmol/L (ref 98–111)
Creatinine: 1.19 mg/dL — ABNORMAL HIGH (ref 0.44–1.00)
GFR, Estimated: 50 mL/min — ABNORMAL LOW (ref >60)
Glucose, Bld: 98 mg/dL (ref 70–99)
Potassium: 3.7 mmol/L (ref 3.5–5.1)
Sodium: 140 mmol/L (ref 135–145)
Total Bilirubin: <0.2 mg/dL — ABNORMAL LOW (ref 0.3–1.2)
Total Protein: 7.7 g/dL (ref 6.5–8.1)

## 2020-11-27 LAB — TSH: TSH: 4.281 u[IU]/mL (ref 0.350–4.500)

## 2020-11-27 LAB — CBC WITH DIFFERENTIAL (CANCER CENTER ONLY)
Abs Immature Granulocytes: 0.04 10*3/uL (ref 0.00–0.07)
Basophils Absolute: 0 10*3/uL (ref 0.0–0.1)
Basophils Relative: 1 %
Eosinophils Absolute: 0 10*3/uL (ref 0.0–0.5)
Eosinophils Relative: 1 %
HCT: 30 % — ABNORMAL LOW (ref 36.0–46.0)
Hemoglobin: 9.9 g/dL — ABNORMAL LOW (ref 12.0–15.0)
Immature Granulocytes: 1 %
Lymphocytes Relative: 11 %
Lymphs Abs: 0.9 10*3/uL (ref 0.7–4.0)
MCH: 33.2 pg (ref 26.0–34.0)
MCHC: 33 g/dL (ref 30.0–36.0)
MCV: 100.7 fL — ABNORMAL HIGH (ref 80.0–100.0)
Monocytes Absolute: 0.8 10*3/uL (ref 0.1–1.0)
Monocytes Relative: 10 %
Neutro Abs: 6 10*3/uL (ref 1.7–7.7)
Neutrophils Relative %: 76 %
Platelet Count: 466 10*3/uL — ABNORMAL HIGH (ref 150–400)
RBC: 2.98 MIL/uL — ABNORMAL LOW (ref 3.87–5.11)
RDW: 14.4 % (ref 11.5–15.5)
WBC Count: 7.7 10*3/uL (ref 4.0–10.5)
nRBC: 0 % (ref 0.0–0.2)

## 2020-11-27 MED ORDER — CYANOCOBALAMIN 1000 MCG/ML IJ SOLN
INTRAMUSCULAR | Status: AC
  Filled 2020-11-27: qty 1

## 2020-11-27 MED ORDER — SODIUM CHLORIDE 0.9 % IV SOLN
200.0000 mg | Freq: Once | INTRAVENOUS | Status: AC
  Administered 2020-11-27: 200 mg via INTRAVENOUS
  Filled 2020-11-27: qty 8

## 2020-11-27 MED ORDER — PROCHLORPERAZINE MALEATE 10 MG PO TABS
ORAL_TABLET | ORAL | Status: AC
  Filled 2020-11-27: qty 1

## 2020-11-27 MED ORDER — SODIUM CHLORIDE 0.9 % IV SOLN
Freq: Once | INTRAVENOUS | Status: AC
Start: 2020-11-27 — End: 2020-11-27
  Filled 2020-11-27: qty 250

## 2020-11-27 MED ORDER — CYANOCOBALAMIN 1000 MCG/ML IJ SOLN
1000.0000 ug | Freq: Once | INTRAMUSCULAR | Status: AC
  Administered 2020-11-27: 1000 ug via INTRAMUSCULAR

## 2020-11-27 MED ORDER — SODIUM CHLORIDE 0.9 % IV SOLN
600.0000 mg | Freq: Once | INTRAVENOUS | Status: AC
  Administered 2020-11-27: 600 mg via INTRAVENOUS
  Filled 2020-11-27: qty 20

## 2020-11-27 MED ORDER — PROCHLORPERAZINE MALEATE 10 MG PO TABS
10.0000 mg | ORAL_TABLET | Freq: Once | ORAL | Status: AC
  Administered 2020-11-27: 10 mg via ORAL

## 2020-11-27 NOTE — Patient Instructions (Signed)
[facility] ONCOL[facility]  Discharge Instructions: Thank you for choosing [facility] to provide your oncology and hematology care.   If you have a lab appointment with the [facility], please go directly to the [facility] and check in at the registration area.   Wear comfortable clothing and clothing appropriate for easy access to any Portacath or PICC line.   We strive to give you quality time with your provider. You may need to reschedule your appointment if you arrive late (15 or more minutes).  Arriving late affects you and other patients whose appointments are after yours.  Also, if you miss three or more appointments without notifying the office, you may be dismissed from the clinic at the provider's discretion.      For prescription refill requests, have your pharmacy contact our office and allow 72 hours for refills to be completed.    Today you received the following chemotherapy and/or immunotherapy agents : Keytruda, Alimta   To help prevent nausea and vomiting after your treatment, we encourage you to take your nausea medication as directed.  BELOW ARE SYMPTOMS THAT SHOULD BE REPORTED IMMEDIATELY: *FEVER GREATER THAN 100.4 F (38 C) OR HIGHER *CHILLS OR SWEATING *NAUSEA AND VOMITING THAT IS NOT CONTROLLED WITH YOUR NAUSEA MEDICATION *UNUSUAL SHORTNESS OF BREATH *UNUSUAL BRUISING OR BLEEDING *URINARY PROBLEMS (pain or burning when urinating, or frequent urination) *BOWEL PROBLEMS (unusual diarrhea, constipation, pain near the anus) TENDERNESS IN MOUTH AND THROAT WITH OR WITHOUT PRESENCE OF ULCERS (sore throat, sores in mouth, or a toothache) UNUSUAL RASH, SWELLING OR PAIN  UNUSUAL VAGINAL DISCHARGE OR ITCHING   Items with * indicate a potential emergency and should be followed up as soon as possible or go to the Emergency Department if any problems should occur.  Please show the CHEMOTHERAPY ALERT CARD or IMMUNOTHERAPY ALERT CARD at  check-in to the Emergency Department and triage nurse.  Should you have questions after your visit or need to cancel or reschedule your appointment, please contact [facility]  Dept: [phone]  and follow the prompts.  Office hours are 8:00 a.m. to 4:30 p.m. Monday - Friday. Please note that voicemails left after 4:00 p.m. may not be returned until the following business day.  We are closed weekends and major holidays. You have access to a nurse at all times for urgent questions. Please call the main number to the clinic Dept: [phone] and follow the prompts.   For any non-urgent questions, you may also contact your provider using MyChart. We now offer e-Visits for anyone 31 and older to request care online for non-urgent symptoms. For details visit [email or web address].   Also download the MyChart app! Go to the app store, search "MyChart", open the app, select [facility], and log in with your MyChart username and password.  Due to Covid, a mask is required upon entering the hospital/clinic. If you do not have a mask, one will be given to you upon arrival. For doctor visits, patients may have 1 support person aged 71 or older with them. For treatment visits, patients cannot have anyone with them due to current Covid guidelines and our immunocompromised population.

## 2020-11-27 NOTE — Progress Notes (Signed)
Recheck of HR 108- okay to proceed with tx as scheduled per Cassie Hellingoeoter, PA.  Calcium also slightly elevated, per provider patient to continue to monitor calcium intake and labs will be rechecked in 3 weeks. Patient informed and states understanding

## 2020-11-29 ENCOUNTER — Inpatient Hospital Stay: Payer: Medicare PPO | Attending: Internal Medicine

## 2020-11-29 NOTE — Progress Notes (Signed)
Nutrition Assessment   Reason for Assessment:  Patient identified on Malnutrition Screening report for weight loss and poor appetite   ASSESSMENT:   65 year old female with metastatic non small cell lung cancer.  Patient on dose reduced alimta.  Past medical history reviewed  Called patient via phone and introduced self and service at [facility].  Patient confused about why RD was calling.  Says that her appetite is good but she can't eat certain foods due to dry mouth.  Says that she gained a lot of weight during COVID and is glad that she is losing weight.  Upset that she is being called.    Ht: 60 inches Wt: 105 lb 11.2 oz  110 lb on 6/20 115 lb on 5/9 117 lb on 3/28 BMI: 20  INTERVENTION:  Explained that RDs proactively call patients who have had weight loss and/or poor appetite to offer strategies and tips to help maintain nutrition.  Patient feels that she does not need nutrition services at this time.  Contact number provided to patient if she has questions or concerns in the future.    Nuria Phebus B. Zenia Resides, [place], [place] Registered Dietitian [phone] (mobile)

## 2020-12-03 ENCOUNTER — Encounter: Payer: Medicare PPO | Admitting: Thoracic Surgery (Cardiothoracic Vascular Surgery)

## 2020-12-05 ENCOUNTER — Encounter: Payer: Medicare PPO | Admitting: Thoracic Surgery (Cardiothoracic Vascular Surgery)

## 2020-12-06 NOTE — Progress Notes (Signed)
[address]       [place] [address]             [phone]                    [facility] Medical Record #[id number] Date of Birth: 1956/03/23  Referring: Revankar, Reita Cliche, MD Primary Care: [name], DO Primary Cardiologist: None  Chief Complaint:    Chief Complaint  Patient presents with   Pleural Effusion    Initial surgical consult, CT chest 6/14, ECHO 7/15    History of Present Illness:    GARNET CHATMON 65 y.o. female with a history of metastatic non-small cell lung cancer presents for surgical evaluation of a moderate size pericardial effusion.  She has been having some tachycardia, but denies any dyspnea on related to her chemotherapy sessions.  She does not have a problem lying flat.  She denies any chest pain or palpitations.  She does have decreased exercise tolerance which is worse after chemotherapy.       Zubrod Score: At the time of surgery this patient's most appropriate activity status/level should be described as: []     0    Normal activity, no symptoms [x]     1    Restricted in physical strenuous activity but ambulatory, able to do out light work []     2    Ambulatory and capable of self care, unable to do work activities, up and about               >50 % of waking hours                              []     3    Only limited self care, in bed greater than 50% of waking hours []     4    Completely disabled, no self care, confined to bed or chair []     5    Moribund   Past Medical History:  Diagnosis Date   Acid reflux 09/21/2018   Adenocarcinoma of right lung, stage 4 ([place]) 11/17/2017   Allergic rhinitis 08/22/2013   ANA positive 05/29/2016   Aortic atherosclerosis ([place]) 10/23/2020   Brain metastases ([place]) 03/29/2019   Coronary artery calcification seen on CT scan 10/23/2020   DDD (degenerative disc disease), cervical 05/29/2016   Encounter for antineoplastic chemotherapy 11/17/2017   Encounter for  antineoplastic immunotherapy 02/01/2018   Goals of care, counseling/discussion 11/17/2017   High risk medication use 05/29/2016   04/29/2016: ==> plq 200 am & 100 qhs(adeq response).   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[place]) 11/04/2018   Other fatigue 05/29/2016   Pericardial effusion 10/23/2020   Primary malignant neoplasm of bronchus of right lower lobe ([place]) 12/21/2017   Rheumatoid arthritis (HCC)    Rheumatoid arthritis involving multiple sites with positive rheumatoid factor ([place]) 05/29/2016   +RF +ANA +CCP    Smoker 05/29/2016   Trigger finger, left ring finger 05/29/2016   Trigger finger, right ring finger 05/29/2016   Vitamin D deficiency 05/29/2016    Past Surgical History:  Procedure Laterality Date   BRONCHIAL NEEDLE ASPIRATION BIOPSY  11/09/2017   Procedure: BRONCHIAL NEEDLE ASPIRATION BIOPSIES;  Surgeon: Vaughan Browner,  Hart Robinsons, MD;  Location: [facility];  Service: Cardiopulmonary;;   ENDOBRONCHIAL ULTRASOUND Bilateral 11/09/2017   Procedure: ENDOBRONCHIAL ULTRASOUND;  Surgeon: Marshell Garfinkel, MD;  Location: [facility];  Service: Cardiopulmonary;  Laterality: Bilateral;   NASAL SINUS SURGERY     NASAL SINUS SURGERY     TUBAL LIGATION     VIDEO BRONCHOSCOPY  11/09/2017   Procedure: VIDEO BRONCHOSCOPY;  Surgeon: Marshell Garfinkel, MD;  Location: [facility];  Service: Cardiopulmonary;;    Family History  Problem Relation Age of Onset   Stroke Mother    Alzheimer's disease Mother    Heart disease Mother    Emphysema Father    Hypertension Brother    Heart attack Maternal Aunt    Heart failure Maternal Grandmother    Hypertension Paternal Grandmother      Social History   Tobacco Use  Smoking Status Former   Packs/day: 0.50   Years: 45.00   Pack years: 22.50   Types: Cigarettes   Quit date: 09/29/2017   Years since quitting: 3.1  Smokeless Tobacco Never     Social History   Substance and Sexual Activity  Alcohol Use Not Currently     No Known Allergies  Current Outpatient Medications  Medication Sig Dispense Refill   diclofenac sodium (VOLTAREN) 1 % GEL Apply 2 g topically 4 (four) times daily.     Fluocinolone Acetonide Scalp 0.01 % OIL Apply 1 application topically daily.     folic acid (FOLVITE) 1 MG tablet Take 1 mg by mouth daily.     hydroxychloroquine (PLAQUENIL) 200 MG tablet TAKE 1 TABLET BY MOUTH TWICE DAILY MONDAY THROUGH FRIDAY ONLY. 120 tablet 0   omeprazole (PRILOSEC) 20 MG capsule Take 20 mg by mouth daily.     prochlorperazine (COMPAZINE) 10 MG tablet Take 10 mg by mouth every 6 (six) hours as needed for nausea/vomiting.     vitamin C (ASCORBIC ACID) 500 MG tablet Take 500 mg by mouth daily.     VITAMIN D PO Take 1 tablet by mouth daily.     No current facility-administered medications for this visit.    Review of Systems  Constitutional:  Positive for malaise/fatigue and weight loss.  Respiratory:  Positive for shortness of breath. Negative for cough.   Cardiovascular:  Negative for chest pain and palpitations.    PHYSICAL EXAMINATION: BP 121/76 (BP Location: Left Arm, Patient Position: Sitting)   Pulse (!) 121   Resp 20   Ht 5' (1.524 m)   Wt 105 lb 3.2 oz (47.7 kg)   LMP 04/28/2000   SpO2 91% Comment: RA  BMI 20.55 kg/m  Physical Exam Constitutional:      Appearance: She is normal weight. She is ill-appearing. She is not toxic-appearing.  HENT:     Head: Normocephalic.  Eyes:     Extraocular Movements: Extraocular movements intact.  Cardiovascular:     Rate and Rhythm: Tachycardia present.  Pulmonary:     Effort: Pulmonary effort is normal. No respiratory distress.  Abdominal:     General: Abdomen is flat. There is no distension.  Musculoskeletal:        General: Normal range of motion.     Cervical back: Normal range of motion.  Skin:    General: Skin is warm and dry.  Neurological:      General: No focal deficit present.     Mental Status: She is alert and oriented to person, place, and time.    Diagnostic Studies & Laboratory data:  Recent Radiology Findings:   ECHOCARDIOGRAM COMPLETE  Result Date: 11/09/2020    ECHOCARDIOGRAM REPORT   Patient Name:   VALENCIA KASSA Date of Exam: 11/09/2020 Medical Rec #:  [id number]         Height:       60.0 in Accession #:    [id number]        Weight:       109.8 lb Date of Birth:  03/10/1956          BSA:          1.447 m Patient Age:    45 years          BP:           102/72 mmHg Patient Gender: F                 HR:           117 bpm. Exam Location:  [address] Procedure: 2D Echo, 3D Echo, Cardiac Doppler and Color Doppler Indications:    I31.3 Pericardial effusion  History:        Patient has no prior history of Echocardiogram examinations.                 Arrythmias:PVC; Risk Factors:Former Smoker. Pericardial                 effusion. Lung cancer. Brain metastasis.  Sonographer:    Jessee Avers, RDCS Referring Phys: [place]  1. Left ventricular ejection fraction, by estimation, is 40 to 45%. The left ventricle has mildly decreased function. The left ventricle demonstrates global hypokinesis. The left ventricular internal cavity size was mildly dilated. Left ventricular diastolic parameters are consistent with Grade I diastolic dysfunction (impaired relaxation).  2. Right ventricular systolic function is moderately reduced. The right ventricular size is normal. There is normal pulmonary artery systolic pressure.  3. There is a moderate circumferential pericardial effusion. There is no collapse of the RV or RA. The MV inflow pattern is normal. There is no evidence of pericardial tamponade but I would recommend close clinical follow up for further evaluation . Marland Kitchen Moderate pericardial effusion. The pericardial effusion is circumferential. There is no evidence of cardiac tamponade.  4. The mitral valve is grossly  normal. No evidence of mitral valve regurgitation.  5. The aortic valve is grossly normal. Aortic valve regurgitation is moderate.  6. The IVC is normal size. There is inspiratory collapse of the IVC. Marland Kitchen The inferior vena cava is normal in size with greater than 50% respiratory variability, suggesting right atrial pressure of 3 mmHg. FINDINGS  Left Ventricle: Left ventricular ejection fraction, by estimation, is 40 to 45%. The left ventricle has mildly decreased function. The left ventricle demonstrates global hypokinesis. The left ventricular internal cavity size was mildly dilated. There is  no left ventricular hypertrophy. Left ventricular diastolic parameters are consistent with Grade I diastolic dysfunction (impaired relaxation). Right Ventricle: The right ventricular size is normal. Right vetricular wall thickness was not well visualized. Right ventricular systolic function is moderately reduced. There is normal pulmonary artery systolic pressure. The tricuspid regurgitant velocity is 2.41 m/s, and with an assumed right atrial pressure of 3 mmHg, the estimated right ventricular systolic pressure is 36.6 mmHg. Left Atrium: Left atrial size was normal in size. Right Atrium: Right atrial size was normal in size. Pericardium: There is a moderate circumferential pericardial effusion. There is no collapse of the RV or RA. The MV inflow pattern  is normal. There is no evidence of pericardial tamponade but I would recommend close clinical follow up for further evaluation. A moderately sized pericardial effusion is present. The pericardial effusion is circumferential. There is no evidence of cardiac tamponade. Mitral Valve: The mitral valve is grossly normal. No evidence of mitral valve regurgitation. Tricuspid Valve: The tricuspid valve is grossly normal. Tricuspid valve regurgitation is not demonstrated. Aortic Valve: The aortic valve is grossly normal. Aortic valve regurgitation is moderate. Aortic regurgitation PHT  measures 422 msec. Pulmonic Valve: The pulmonic valve was normal in structure. Pulmonic valve regurgitation is not visualized. Aorta: The aortic root and ascending aorta are structurally normal, with no evidence of dilitation. Venous: The IVC is normal size. There is inspiratory collapse of the IVC. The inferior vena cava is normal in size with greater than 50% respiratory variability, suggesting right atrial pressure of 3 mmHg. IAS/Shunts: The atrial septum is grossly normal.  LEFT VENTRICLE PLAX 2D LVIDd:         3.80 cm  Diastology LVIDs:         2.80 cm  LV e' medial:    4.57 cm/s LV PW:         0.70 cm  LV E/e' medial:  8.2 LV IVS:        0.70 cm  LV e' lateral:   4.68 cm/s LVOT diam:     2.10 cm  LV E/e' lateral: 8.1 LV SV:         53 LV SV Index:   36       2D Longitudinal Strain LVOT Area:     3.46 cm 2D Strain GLS (A2C):   -13.7 %                         2D Strain GLS (A3C):   -16.1 %                         2D Strain GLS (A4C):   -19.8 %                         2D Strain GLS Avg:     -16.5 %                          3D Volume EF:                         3D EF:        52 %                         LV EDV:       92 ml                         LV ESV:       44 ml                         LV SV:        47 ml RIGHT VENTRICLE RV Basal diam:  2.60 cm RV S prime:     14.90 cm/s TAPSE (M-mode): 1.1 cm RVSP:           26.2 mmHg LEFT ATRIUM  Index      RIGHT ATRIUM           Index LA diam:        2.40 cm 1.66 cm/m RA Pressure: 3.00 mmHg LA Vol (A2C):   9.8 ml  6.80 ml/m RA Area:     8.09 cm LA Vol (A4C):   10.4 ml 7.19 ml/m RA Volume:   15.10 ml  10.44 ml/m LA Biplane Vol: 10.2 ml 7.05 ml/m  AORTIC VALVE LVOT Vmax:   98.60 cm/s LVOT Vmean:  69.600 cm/s LVOT VTI:    0.152 m AI PHT:      422 msec  AORTA Ao Root diam: 2.80 cm Ao Asc diam:  3.20 cm MITRAL VALVE               TRICUSPID VALVE                            TR Peak grad:   23.2 mmHg                            TR Vmax:        241.00 cm/s MV E  velocity: 37.70 cm/s  Estimated RAP:  3.00 mmHg MV A velocity: 72.80 cm/s  RVSP:           26.2 mmHg MV E/A ratio:  0.52                            SHUNTS                            Systemic VTI:  0.15 m                            Systemic Diam: 2.10 cm Mertie Moores MD Electronically signed by Mertie Moores MD Signature Date/Time: 11/09/2020/4:55:43 PM    Final        I have independently reviewed the above radiology studies  and reviewed the findings with the patient.   Recent Lab Findings: Lab Results  Component Value Date   WBC 7.7 11/27/2020   HGB 9.9 (L) 11/27/2020   HCT 30.0 (L) 11/27/2020   PLT 466 (H) 11/27/2020   GLUCOSE 98 11/27/2020   ALT 7 11/27/2020   AST 23 11/27/2020   NA 140 11/27/2020   K 3.7 11/27/2020   CL 104 11/27/2020   CREATININE 1.19 (H) 11/27/2020   BUN 11 11/27/2020   CO2 25 11/27/2020   TSH 4.281 11/27/2020   INR 1.2 (H) 11/02/2017       Assessment / Plan:   65 year old female with metastatic non-small cell lung cancer who also has a pericardial effusion based off her recent echo.  It is circumferential.  There is no evidence of tamponade.  Symptomatically she is only tachycardic but does not appear to have any respiratory symptoms related to this.  Given everything that she is going through I think the best option would be for a pericardial drainage percutaneously prior to committing her to a surgical window to see if this improves her tachycardia.  If her tachycardia improves after drainage of the effusion, and it reaccumulate's then she would be a candidate for robotic assisted thoracoscopy with pericardial window.  I will touch base with her cardiologist to share  my thoughts and await further instructions from them.      I  spent 40 minutes with  the patient face to face in counseling and coordination of care.    Lajuana Matte 12/07/2020 4:38 PM

## 2020-12-07 ENCOUNTER — Institutional Professional Consult (permissible substitution): Payer: Medicare PPO | Admitting: Thoracic Surgery (Cardiothoracic Vascular Surgery)

## 2020-12-07 ENCOUNTER — Other Ambulatory Visit: Payer: Self-pay

## 2020-12-07 VITALS — BP 121/76 | HR 121 | Resp 20 | Ht 60.0 in | Wt 105.2 lb

## 2020-12-07 DIAGNOSIS — I313 Pericardial effusion (noninflammatory): Secondary | ICD-10-CM | POA: Diagnosis not present

## 2020-12-07 DIAGNOSIS — I3139 Other pericardial effusion (noninflammatory): Secondary | ICD-10-CM

## 2020-12-11 DIAGNOSIS — C3490 Malignant neoplasm of unspecified part of unspecified bronchus or lung: Secondary | ICD-10-CM | POA: Diagnosis not present

## 2020-12-18 ENCOUNTER — Other Ambulatory Visit: Payer: Self-pay

## 2020-12-18 ENCOUNTER — Other Ambulatory Visit: Payer: Medicare PPO

## 2020-12-18 ENCOUNTER — Inpatient Hospital Stay: Payer: Medicare PPO

## 2020-12-18 ENCOUNTER — Encounter: Payer: Self-pay | Admitting: Internal Medicine

## 2020-12-18 ENCOUNTER — Inpatient Hospital Stay: Payer: Medicare PPO | Admitting: Internal Medicine

## 2020-12-18 VITALS — BP 149/69 | HR 118 | Temp 97.6°F | Resp 20 | Ht 60.0 in | Wt 104.6 lb

## 2020-12-18 DIAGNOSIS — C3431 Malignant neoplasm of lower lobe, right bronchus or lung: Secondary | ICD-10-CM

## 2020-12-18 DIAGNOSIS — C3491 Malignant neoplasm of unspecified part of right bronchus or lung: Secondary | ICD-10-CM

## 2020-12-18 DIAGNOSIS — R000 Tachycardia, unspecified: Secondary | ICD-10-CM | POA: Diagnosis not present

## 2020-12-18 DIAGNOSIS — Z5112 Encounter for antineoplastic immunotherapy: Secondary | ICD-10-CM | POA: Diagnosis not present

## 2020-12-18 DIAGNOSIS — C770 Secondary and unspecified malignant neoplasm of lymph nodes of head, face and neck: Secondary | ICD-10-CM | POA: Diagnosis not present

## 2020-12-18 DIAGNOSIS — Z5111 Encounter for antineoplastic chemotherapy: Secondary | ICD-10-CM | POA: Diagnosis not present

## 2020-12-18 DIAGNOSIS — I313 Pericardial effusion (noninflammatory): Secondary | ICD-10-CM | POA: Diagnosis not present

## 2020-12-18 DIAGNOSIS — I700 Atherosclerosis of aorta: Secondary | ICD-10-CM | POA: Diagnosis not present

## 2020-12-18 DIAGNOSIS — C7931 Secondary malignant neoplasm of brain: Secondary | ICD-10-CM | POA: Diagnosis not present

## 2020-12-18 DIAGNOSIS — C3490 Malignant neoplasm of unspecified part of unspecified bronchus or lung: Secondary | ICD-10-CM

## 2020-12-18 DIAGNOSIS — Z923 Personal history of irradiation: Secondary | ICD-10-CM | POA: Diagnosis not present

## 2020-12-18 LAB — CMP (CANCER CENTER ONLY)
ALT: 9 U/L (ref 0–44)
AST: 23 U/L (ref 15–41)
Albumin: 2.8 g/dL — ABNORMAL LOW (ref 3.5–5.0)
Alkaline Phosphatase: 70 U/L (ref 38–126)
Anion gap: 12 (ref 5–15)
BUN: 13 mg/dL (ref 8–23)
CO2: 25 mmol/L (ref 22–32)
Calcium: 11.1 mg/dL — ABNORMAL HIGH (ref 8.9–10.3)
Chloride: 104 mmol/L (ref 98–111)
Creatinine: 1.13 mg/dL — ABNORMAL HIGH (ref 0.44–1.00)
GFR, Estimated: 53 mL/min — ABNORMAL LOW (ref >60)
Glucose, Bld: 92 mg/dL (ref 70–99)
Potassium: 4 mmol/L (ref 3.5–5.1)
Sodium: 141 mmol/L (ref 135–145)
Total Bilirubin: 0.2 mg/dL — ABNORMAL LOW (ref 0.3–1.2)
Total Protein: 7.8 g/dL (ref 6.5–8.1)

## 2020-12-18 LAB — CBC WITH DIFFERENTIAL (CANCER CENTER ONLY)
Abs Immature Granulocytes: 0.03 10*3/uL (ref 0.00–0.07)
Basophils Absolute: 0 10*3/uL (ref 0.0–0.1)
Basophils Relative: 1 %
Eosinophils Absolute: 0.1 10*3/uL (ref 0.0–0.5)
Eosinophils Relative: 1 %
HCT: 31.1 % — ABNORMAL LOW (ref 36.0–46.0)
Hemoglobin: 10 g/dL — ABNORMAL LOW (ref 12.0–15.0)
Immature Granulocytes: 0 %
Lymphocytes Relative: 12 %
Lymphs Abs: 1.1 10*3/uL (ref 0.7–4.0)
MCH: 33.1 pg (ref 26.0–34.0)
MCHC: 32.2 g/dL (ref 30.0–36.0)
MCV: 103 fL — ABNORMAL HIGH (ref 80.0–100.0)
Monocytes Absolute: 1 10*3/uL (ref 0.1–1.0)
Monocytes Relative: 12 %
Neutro Abs: 6.6 10*3/uL (ref 1.7–7.7)
Neutrophils Relative %: 74 %
Platelet Count: 537 10*3/uL — ABNORMAL HIGH (ref 150–400)
RBC: 3.02 MIL/uL — ABNORMAL LOW (ref 3.87–5.11)
RDW: 14.5 % (ref 11.5–15.5)
WBC Count: 8.8 10*3/uL (ref 4.0–10.5)
nRBC: 0 % (ref 0.0–0.2)

## 2020-12-18 LAB — TSH: TSH: 1.837 u[IU]/mL (ref 0.308–3.960)

## 2020-12-18 MED ORDER — SODIUM CHLORIDE 0.9 % IV SOLN
200.0000 mg | Freq: Once | INTRAVENOUS | Status: AC
  Administered 2020-12-18: 200 mg via INTRAVENOUS
  Filled 2020-12-18: qty 8

## 2020-12-18 MED ORDER — SODIUM CHLORIDE 0.9 % IV SOLN: Freq: Once | INTRAVENOUS | Status: AC

## 2020-12-18 MED ORDER — PROCHLORPERAZINE MALEATE 10 MG PO TABS
10.0000 mg | ORAL_TABLET | Freq: Once | ORAL | Status: AC
  Administered 2020-12-18: 10 mg via ORAL
  Filled 2020-12-18: qty 1

## 2020-12-18 MED ORDER — SODIUM CHLORIDE 0.9 % IV SOLN
600.0000 mg | Freq: Once | INTRAVENOUS | Status: AC
  Administered 2020-12-18: 600 mg via INTRAVENOUS
  Filled 2020-12-18: qty 20

## 2020-12-18 NOTE — Patient Instructions (Signed)
[facility] ONCOLOGY  Discharge Instructions: Thank you for choosing [facility] to provide your oncology and hematology care.   If you have a lab appointment with the [facility], please go directly to the [facility] and check in at the registration area.   Wear comfortable clothing and clothing appropriate for easy access to any Portacath or PICC line.   We strive to give you quality time with your provider. You may need to reschedule your appointment if you arrive late (15 or more minutes).  Arriving late affects you and other patients whose appointments are after yours.  Also, if you miss three or more appointments without notifying the office, you may be dismissed from the clinic at the provider's discretion.      For prescription refill requests, have your pharmacy contact our office and allow 72 hours for refills to be completed.    Today you received the following chemotherapy and/or immunotherapy agents : Keytruda, Alimta   To help prevent nausea and vomiting after your treatment, we encourage you to take your nausea medication as directed.  BELOW ARE SYMPTOMS THAT SHOULD BE REPORTED IMMEDIATELY: *FEVER GREATER THAN 100.4 F (38 C) OR HIGHER *CHILLS OR SWEATING *NAUSEA AND VOMITING THAT IS NOT CONTROLLED WITH YOUR NAUSEA MEDICATION *UNUSUAL SHORTNESS OF BREATH *UNUSUAL BRUISING OR BLEEDING *URINARY PROBLEMS (pain or burning when urinating, or frequent urination) *BOWEL PROBLEMS (unusual diarrhea, constipation, pain near the anus) TENDERNESS IN MOUTH AND THROAT WITH OR WITHOUT PRESENCE OF ULCERS (sore throat, sores in mouth, or a toothache) UNUSUAL RASH, SWELLING OR PAIN  UNUSUAL VAGINAL DISCHARGE OR ITCHING   Items with * indicate a potential emergency and should be followed up as soon as possible or go to the Emergency Department if any problems should occur.  Please show the CHEMOTHERAPY ALERT CARD or IMMUNOTHERAPY ALERT CARD at  check-in to the Emergency Department and triage nurse.  Should you have questions after your visit or need to cancel or reschedule your appointment, please contact [facility]  Dept: [phone]  and follow the prompts.  Office hours are 8:00 a.m. to 4:30 p.m. Monday - Friday. Please note that voicemails left after 4:00 p.m. may not be returned until the following business day.  We are closed weekends and major holidays. You have access to a nurse at all times for urgent questions. Please call the main number to the clinic Dept: [phone] and follow the prompts.   For any non-urgent questions, you may also contact your provider using MyChart. We now offer e-Visits for anyone 64 and older to request care online for non-urgent symptoms. For details visit [email or web address].   Also download the MyChart app! Go to the app store, search "MyChart", open the app, select [facility], and log in with your MyChart username and password.  Due to Covid, a mask is required upon entering the hospital/clinic. If you do not have a mask, one will be given to you upon arrival. For doctor visits, patients may have 1 support person aged 38 or older with them. For treatment visits, patients cannot have anyone with them due to current Covid guidelines and our immunocompromised population.

## 2020-12-18 NOTE — Progress Notes (Signed)
Susan Holder [phone]   [phone]  OFFICE PROGRESS NOTE  [name], DO No address on file  DIAGNOSIS: Metastatic non-small cell lung cancer initially diagnosed as stage IIIA (T3, N1/N2, M0) non-small cell lung cancer, adenocarcinoma presented with large right lower lobe lung mass with extension to the right hilum and subcarinal area diagnosed in July 2019.  She has brain metastasis in October 2020.  Biomarker Findings Tumor Mutational Burden - TMB-Intermediate (6 Muts/Mb) Microsatellite status - MS-Stable Genomic Findings For a complete list of the genes assayed, please refer to the Appendix. NRAS Q61R ARAF amplification STK11 G56W KRAS G13D MYCN amplification MCL1 amplification NKX2-1 amplification - equivocal? TP53 G245V 7 Disease relevant genes with no reportable alterations: EGFR, ALK, BRAF, MET, ERBB2, RET, ROS1   PRIOR THERAPY:  1) Course of concurrent chemoradiation with weekly carboplatin for AUC of 2 and paclitaxel 45 mg/M2.  Status post 7 cycles.  Last dose was giving 01/11/2018. 2) Consolidation treatment with immunotherapy with Imfinzi (Durvalumab) 10 mg/KG every 2 weeks.  First dose February 09, 2018.  Status post 19 cycles. 3) status post stereotactic body radiotherapy to the enlarging right supraclavicular lymphadenopathy under the care of Dr. Lisbeth Renshaw. 4) SRS to multiple brain metastasis under the care of Dr. Lisbeth Renshaw.  CURRENT THERAPY: Systemic chemotherapy with carboplatin for AUC of 5, Alimta 500 mg/M2 and Keytruda 200 mg IV every 3 weeks.  First dose 08/16/2019.  Status post 23 cycles.  Starting from cycle #5 the patient is on maintenance treatment with Alimta and Keytruda every 3 weeks.  INTERVAL HISTORY: Susan Holder 65 y.o. female returns to the clinic today for follow-up visit.  The patient is feeling fine today with no concerning complaints except for the baseline fatigue.  She is also tachycardic and she was  seen by Dr. Kipp Brood for evaluation of her pericardial effusion and expected to have another follow-up visit with him soon for consideration of drainage of the pericardial effusion.  The patient denied having any current chest pain but has shortness of breath with exertion with no cough or hemoptysis.  She has no nausea, vomiting, diarrhea or constipation.  She has no headache or visual changes.  She continues to tolerate her treatment with maintenance Alimta and Keytruda fairly well.  She is here for evaluation before starting cycle #24.   MEDICAL HISTORY: Past Medical History:  Diagnosis Date   Acid reflux 09/21/2018   Adenocarcinoma of right lung, stage 4 ([place]) 11/17/2017   Allergic rhinitis 08/22/2013   ANA positive 05/29/2016   Aortic atherosclerosis ([place]) 10/23/2020   Brain metastases ([place]) 03/29/2019   Coronary artery calcification seen on CT scan 10/23/2020   DDD (degenerative disc disease), cervical 05/29/2016   Encounter for antineoplastic chemotherapy 11/17/2017   Encounter for antineoplastic immunotherapy 02/01/2018   Goals of care, counseling/discussion 11/17/2017   High risk medication use 05/29/2016   04/29/2016: ==> plq 200 am & 100 qhs(adeq response).   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[place]) 11/04/2018   Other fatigue 05/29/2016   Pericardial effusion 10/23/2020   Primary malignant neoplasm of bronchus of right lower lobe ([place]) 12/21/2017   Rheumatoid arthritis ([place])    Rheumatoid arthritis involving multiple sites with positive rheumatoid factor ([place]) 05/29/2016   +RF +ANA +CCP    Smoker 05/29/2016  Trigger finger, left ring finger 05/29/2016   Trigger finger, right ring finger 05/29/2016   Vitamin D deficiency 05/29/2016    ALLERGIES:  has No Known Allergies.  MEDICATIONS:  Current Outpatient Medications  Medication Sig Dispense Refill   diclofenac sodium  (VOLTAREN) 1 % GEL Apply 2 g topically 4 (four) times daily.     Fluocinolone Acetonide Scalp 0.01 % OIL Apply 1 application topically daily.     folic acid (FOLVITE) 1 MG tablet Take 1 mg by mouth daily.     hydroxychloroquine (PLAQUENIL) 200 MG tablet TAKE 1 TABLET BY MOUTH TWICE DAILY MONDAY THROUGH FRIDAY ONLY. 120 tablet 0   omeprazole (PRILOSEC) 20 MG capsule Take 20 mg by mouth daily.     prochlorperazine (COMPAZINE) 10 MG tablet Take 10 mg by mouth every 6 (six) hours as needed for nausea/vomiting.     vitamin C (ASCORBIC ACID) 500 MG tablet Take 500 mg by mouth daily.     VITAMIN D PO Take 1 tablet by mouth daily.     No current facility-administered medications for this visit.    SURGICAL HISTORY:  Past Surgical History:  Procedure Laterality Date   BRONCHIAL NEEDLE ASPIRATION BIOPSY  11/09/2017   Procedure: BRONCHIAL NEEDLE ASPIRATION BIOPSIES;  Surgeon: Marshell Garfinkel, MD;  Location: [facility];  Service: Cardiopulmonary;;   ENDOBRONCHIAL ULTRASOUND Bilateral 11/09/2017   Procedure: ENDOBRONCHIAL ULTRASOUND;  Surgeon: Marshell Garfinkel, MD;  Location: [facility];  Service: Cardiopulmonary;  Laterality: Bilateral;   NASAL SINUS SURGERY     NASAL SINUS SURGERY     TUBAL LIGATION     VIDEO BRONCHOSCOPY  11/09/2017   Procedure: VIDEO BRONCHOSCOPY;  Surgeon: Marshell Garfinkel, MD;  Location: [facility];  Service: Cardiopulmonary;;    REVIEW OF SYSTEMS:  A comprehensive review of systems was negative except for: Constitutional: positive for fatigue Respiratory: positive for dyspnea on exertion Cardiovascular: positive for tachycardia    PHYSICAL EXAMINATION: General appearance: alert, cooperative, fatigued, and no distress Head: Normocephalic, without obvious abnormality, atraumatic Neck: no adenopathy, no JVD, supple, symmetrical, trachea midline, and thyroid not enlarged, symmetric, no tenderness/mass/nodules Lymph nodes: Cervical, supraclavicular, and axillary nodes  normal. Resp: clear to auscultation bilaterally Back: symmetric, no curvature. ROM normal. No CVA tenderness. Cardio: regular rate and rhythm, S1, S2 normal, no murmur, click, rub or gallop and tachycardic GI: soft, non-tender; bowel sounds normal; no masses,  no organomegaly Extremities: extremities normal, atraumatic, no cyanosis or edema  ECOG PERFORMANCE STATUS: 0 - Asymptomatic  Blood pressure (!) 149/69, pulse (!) 118, temperature 97.6 F (36.4 C), temperature source Tympanic, resp. rate 20, height 5' (1.524 m), weight 104 lb 9.6 oz (47.4 kg), last menstrual period 04/28/2000, SpO2 98 %.  LABORATORY DATA: Lab Results  Component Value Date   WBC 8.8 12/18/2020   HGB 10.0 (L) 12/18/2020   HCT 31.1 (L) 12/18/2020   MCV 103.0 (H) 12/18/2020   PLT 537 (H) 12/18/2020      Chemistry      Component Value Date/Time   NA 140 11/27/2020 1112   K 3.7 11/27/2020 1112   CL 104 11/27/2020 1112   CO2 25 11/27/2020 1112   BUN 11 11/27/2020 1112   CREATININE 1.19 (H) 11/27/2020 1112   CREATININE 0.80 07/24/2017 1438      Component Value Date/Time   CALCIUM 10.8 (H) 11/27/2020 1112   ALKPHOS 75 11/27/2020 1112   AST 23 11/27/2020 1112   ALT 7 11/27/2020 1112   BILITOT <0.2 (L) 11/27/2020 1112  RADIOGRAPHIC STUDIES: No results found.  ASSESSMENT AND PLAN: This is a very pleasant 65 years old African-American female with metastatic non-small cell lung cancer initially diagnosed with a stage IIIA non-small cell lung cancer, adenocarcinoma.  She underwent a course of concurrent chemoradiation with weekly carboplatin and paclitaxel status post 7 cycles with partial response.   The patient tolerated this course of treatment well except for mild odynophagia and dysphagia. She completed on consolidation treatment with immunotherapy with Imfinzi (Durvalumab) status post 18 cycles. She also completed SBRT to the right supraclavicular lymphadenopathy. The patient had evidence for  multiple brain metastasis in October 2020 and she underwent SRS treatment to this lesion under the care of Dr. Lisbeth Renshaw. The patient had evidence for disease progression and she started systemic chemotherapy with carboplatin, Alimta and Keytruda status post 23 cycles.  Starting from cycle #5 she is on maintenance treatment with Alimta and Keytruda every 3 weeks. The patient continues to tolerate this treatment well with no concerning adverse effect except for mild fatigue. I recommended for her to proceed with cycle #24 today as planned. I will see her back for follow-up visit in 3 weeks for evaluation with repeat CT scan of the chest, abdomen pelvis for restaging of her disease. The patient is also tachycardic.  She was seen by cardiology as well as Dr. Kipp Brood from cardiothoracic surgery.  She is expected to have drainage of the pericardial effusion soon. The patient was advised to call immediately if she has any other concerning symptoms in the interval. The patient voices understanding of current disease status and treatment options and is in agreement with the current care plan. All questions were answered. The patient knows to call the clinic with any problems, questions or concerns. We can certainly see the patient much sooner if necessary.  Disclaimer: This note was dictated with voice recognition software. Similar sounding words can inadvertently be transcribed and may not be corrected upon review.

## 2020-12-18 NOTE — Progress Notes (Signed)
Per Dr Julien Nordmann ,it is ok to treat pt today with Alimta and Keytruda nad heart rate of 118.

## 2020-12-28 ENCOUNTER — Other Ambulatory Visit: Payer: Self-pay | Admitting: Radiation Therapy

## 2020-12-28 DIAGNOSIS — C7949 Secondary malignant neoplasm of other parts of nervous system: Secondary | ICD-10-CM

## 2020-12-28 DIAGNOSIS — C7931 Secondary malignant neoplasm of brain: Secondary | ICD-10-CM

## 2021-01-07 ENCOUNTER — Other Ambulatory Visit: Payer: Self-pay

## 2021-01-07 ENCOUNTER — Ambulatory Visit (HOSPITAL_COMMUNITY)
Admission: RE | Admit: 2021-01-07 | Discharge: 2021-01-07 | Disposition: A | Discharge status: Home / Self Care | Payer: Medicare PPO | Source: Ambulatory Visit | Attending: Internal Medicine | Admitting: Internal Medicine

## 2021-01-07 ENCOUNTER — Other Ambulatory Visit: Payer: Self-pay | Admitting: Radiation Therapy

## 2021-01-07 DIAGNOSIS — I313 Pericardial effusion (noninflammatory): Secondary | ICD-10-CM | POA: Diagnosis not present

## 2021-01-07 DIAGNOSIS — I700 Atherosclerosis of aorta: Secondary | ICD-10-CM | POA: Diagnosis not present

## 2021-01-07 DIAGNOSIS — J439 Emphysema, unspecified: Secondary | ICD-10-CM | POA: Diagnosis not present

## 2021-01-07 DIAGNOSIS — R911 Solitary pulmonary nodule: Secondary | ICD-10-CM | POA: Diagnosis not present

## 2021-01-07 DIAGNOSIS — C3490 Malignant neoplasm of unspecified part of unspecified bronchus or lung: Secondary | ICD-10-CM | POA: Insufficient documentation

## 2021-01-07 DIAGNOSIS — J90 Pleural effusion, not elsewhere classified: Secondary | ICD-10-CM | POA: Diagnosis not present

## 2021-01-07 MED ORDER — IOHEXOL 350 MG/ML SOLN
80.0000 mL | Freq: Once | INTRAVENOUS | Status: AC | PRN
  Administered 2021-01-07: 80 mL via INTRAVENOUS

## 2021-01-08 ENCOUNTER — Other Ambulatory Visit: Payer: Medicare PPO

## 2021-01-08 ENCOUNTER — Inpatient Hospital Stay: Payer: Medicare PPO

## 2021-01-08 ENCOUNTER — Inpatient Hospital Stay: Payer: Medicare PPO | Attending: Internal Medicine | Admitting: Internal Medicine

## 2021-01-08 ENCOUNTER — Telehealth: Payer: Self-pay | Admitting: Cardiology

## 2021-01-08 ENCOUNTER — Other Ambulatory Visit: Payer: Self-pay | Admitting: Internal Medicine

## 2021-01-08 VITALS — BP 150/72 | HR 117 | Temp 97.1°F | Resp 18 | Ht 60.0 in | Wt 107.1 lb

## 2021-01-08 DIAGNOSIS — C3431 Malignant neoplasm of lower lobe, right bronchus or lung: Secondary | ICD-10-CM | POA: Insufficient documentation

## 2021-01-08 DIAGNOSIS — Z79899 Other long term (current) drug therapy: Secondary | ICD-10-CM | POA: Insufficient documentation

## 2021-01-08 DIAGNOSIS — R0609 Other forms of dyspnea: Secondary | ICD-10-CM | POA: Diagnosis not present

## 2021-01-08 DIAGNOSIS — Z87891 Personal history of nicotine dependence: Secondary | ICD-10-CM | POA: Diagnosis not present

## 2021-01-08 DIAGNOSIS — C3491 Malignant neoplasm of unspecified part of right bronchus or lung: Secondary | ICD-10-CM

## 2021-01-08 DIAGNOSIS — M069 Rheumatoid arthritis, unspecified: Secondary | ICD-10-CM | POA: Insufficient documentation

## 2021-01-08 DIAGNOSIS — R000 Tachycardia, unspecified: Secondary | ICD-10-CM | POA: Insufficient documentation

## 2021-01-08 DIAGNOSIS — R5383 Other fatigue: Secondary | ICD-10-CM | POA: Diagnosis not present

## 2021-01-08 DIAGNOSIS — Z5111 Encounter for antineoplastic chemotherapy: Secondary | ICD-10-CM | POA: Diagnosis not present

## 2021-01-08 DIAGNOSIS — I700 Atherosclerosis of aorta: Secondary | ICD-10-CM | POA: Diagnosis not present

## 2021-01-08 DIAGNOSIS — C770 Secondary and unspecified malignant neoplasm of lymph nodes of head, face and neck: Secondary | ICD-10-CM | POA: Diagnosis not present

## 2021-01-08 DIAGNOSIS — I2510 Atherosclerotic heart disease of native coronary artery without angina pectoris: Secondary | ICD-10-CM | POA: Diagnosis not present

## 2021-01-08 DIAGNOSIS — Z5112 Encounter for antineoplastic immunotherapy: Secondary | ICD-10-CM

## 2021-01-08 DIAGNOSIS — I313 Pericardial effusion (noninflammatory): Secondary | ICD-10-CM | POA: Insufficient documentation

## 2021-01-08 LAB — CBC WITH DIFFERENTIAL (CANCER CENTER ONLY)
Abs Immature Granulocytes: 0.04 10*3/uL (ref 0.00–0.07)
Basophils Absolute: 0.1 10*3/uL (ref 0.0–0.1)
Basophils Relative: 1 %
Eosinophils Absolute: 0 10*3/uL (ref 0.0–0.5)
Eosinophils Relative: 0 %
HCT: 30.3 % — ABNORMAL LOW (ref 36.0–46.0)
Hemoglobin: 9.7 g/dL — ABNORMAL LOW (ref 12.0–15.0)
Immature Granulocytes: 1 %
Lymphocytes Relative: 10 %
Lymphs Abs: 0.9 10*3/uL (ref 0.7–4.0)
MCH: 33.4 pg (ref 26.0–34.0)
MCHC: 32 g/dL (ref 30.0–36.0)
MCV: 104.5 fL — ABNORMAL HIGH (ref 80.0–100.0)
Monocytes Absolute: 0.7 10*3/uL (ref 0.1–1.0)
Monocytes Relative: 8 %
Neutro Abs: 7.2 10*3/uL (ref 1.7–7.7)
Neutrophils Relative %: 80 %
Platelet Count: 521 10*3/uL — ABNORMAL HIGH (ref 150–400)
RBC: 2.9 MIL/uL — ABNORMAL LOW (ref 3.87–5.11)
RDW: 15.3 % (ref 11.5–15.5)
WBC Count: 8.9 10*3/uL (ref 4.0–10.5)
nRBC: 0 % (ref 0.0–0.2)

## 2021-01-08 LAB — CMP (CANCER CENTER ONLY)
ALT: 9 U/L (ref 0–44)
AST: 22 U/L (ref 15–41)
Albumin: 2.6 g/dL — ABNORMAL LOW (ref 3.5–5.0)
Alkaline Phosphatase: 68 U/L (ref 38–126)
Anion gap: 10 (ref 5–15)
BUN: 10 mg/dL (ref 8–23)
CO2: 25 mmol/L (ref 22–32)
Calcium: 10.5 mg/dL — ABNORMAL HIGH (ref 8.9–10.3)
Chloride: 106 mmol/L (ref 98–111)
Creatinine: 1.14 mg/dL — ABNORMAL HIGH (ref 0.44–1.00)
GFR, Estimated: 53 mL/min — ABNORMAL LOW (ref >60)
Glucose, Bld: 95 mg/dL (ref 70–99)
Potassium: 3.8 mmol/L (ref 3.5–5.1)
Sodium: 141 mmol/L (ref 135–145)
Total Bilirubin: <0.2 mg/dL — ABNORMAL LOW (ref 0.3–1.2)
Total Protein: 7.4 g/dL (ref 6.5–8.1)

## 2021-01-08 LAB — TSH: TSH: 2.754 u[IU]/mL (ref 0.350–4.500)

## 2021-01-08 MED ORDER — PROCHLORPERAZINE MALEATE 10 MG PO TABS
10.0000 mg | ORAL_TABLET | Freq: Once | ORAL | Status: AC
  Administered 2021-01-08: 10 mg via ORAL
  Filled 2021-01-08: qty 1

## 2021-01-08 MED ORDER — SODIUM CHLORIDE 0.9 % IV SOLN
600.0000 mg | Freq: Once | INTRAVENOUS | Status: AC
  Administered 2021-01-08: 600 mg via INTRAVENOUS
  Filled 2021-01-08: qty 20

## 2021-01-08 MED ORDER — SODIUM CHLORIDE 0.9 % IV SOLN
200.0000 mg | Freq: Once | INTRAVENOUS | Status: AC
  Administered 2021-01-08: 200 mg via INTRAVENOUS
  Filled 2021-01-08: qty 8

## 2021-01-08 MED ORDER — SODIUM CHLORIDE 0.9 % IV SOLN
Freq: Once | INTRAVENOUS | Status: AC
Start: 2021-01-08 — End: 2021-01-08

## 2021-01-08 NOTE — Telephone Encounter (Signed)
Recommendations reviewed with pt as per Dr. Julien Nordmann note.  Pt verbalized understanding and had no additional questions.

## 2021-01-08 NOTE — Progress Notes (Signed)
[facility] [phone]   [phone]  OFFICE PROGRESS NOTE  Pcp, No No address on file  DIAGNOSIS: Metastatic non-small cell lung cancer initially diagnosed as stage IIIA (T3, N1/N2, M0) non-small cell lung cancer, adenocarcinoma presented with large right lower lobe lung mass with extension to the right hilum and subcarinal area diagnosed in July 2019.  She has brain metastasis in October 2020.  Biomarker Findings Tumor Mutational Burden - TMB-Intermediate (6 Muts/Mb) Microsatellite status - MS-Stable Genomic Findings For a complete list of the genes assayed, please refer to the Appendix. NRAS Q61R ARAF amplification STK11 G56W KRAS G13D MYCN amplification MCL1 amplification NKX2-1 amplification - equivocal? TP53 G245V 7 Disease relevant genes with no reportable alterations: EGFR, ALK, BRAF, MET, ERBB2, RET, ROS1   PRIOR THERAPY:  1) Course of concurrent chemoradiation with weekly carboplatin for AUC of 2 and paclitaxel 45 mg/M2.  Status post 7 cycles.  Last dose was giving 01/11/2018. 2) Consolidation treatment with immunotherapy with Imfinzi (Durvalumab) 10 mg/KG every 2 weeks.  First dose February 09, 2018.  Status post 19 cycles. 3) status post stereotactic body radiotherapy to the enlarging right supraclavicular lymphadenopathy under the care of Dr. Lisbeth Renshaw. 4) SRS to multiple brain metastasis under the care of Dr. Lisbeth Renshaw.  CURRENT THERAPY: Systemic chemotherapy with carboplatin for AUC of 5, Alimta 500 mg/M2 and Keytruda 200 mg IV every 3 weeks.  First dose 08/16/2019.  Status post 24 cycles.  Starting from cycle #5 the patient is on maintenance treatment with Alimta and Keytruda every 3 weeks.  INTERVAL HISTORY: Susan Holder 65 y.o. female returns to the clinic today for follow-up visit.  The patient is feeling fine today with no concerning complaints except for persistent fatigue as well as tachycardia.  She was referred to Dr.  Geraldo Pitter as well as Dr. Kipp Brood for evaluation of pericardial effusion but no intervention has been done yet.  The patient denied having any chest pain but has shortness of breath with exertion with no cough or hemoptysis.  She denied having any nausea, vomiting, diarrhea or constipation.  She has no headache or visual changes.  She denied having any recent weight loss or night sweats.  She continues to tolerate her treatment with maintenance Alimta and Keytruda fairly well.  She is here today for evaluation with repeat CT scan of the chest, abdomen and pelvis for restaging of her disease before starting cycle #25.  MEDICAL HISTORY: Past Medical History:  Diagnosis Date   Acid reflux 09/21/2018   Adenocarcinoma of right lung, stage 4 ([place]) 11/17/2017   Allergic rhinitis 08/22/2013   ANA positive 05/29/2016   Aortic atherosclerosis ([place]) 10/23/2020   Brain metastases ([place]) 03/29/2019   Coronary artery calcification seen on CT scan 10/23/2020   DDD (degenerative disc disease), cervical 05/29/2016   Encounter for antineoplastic chemotherapy 11/17/2017   Encounter for antineoplastic immunotherapy 02/01/2018   Goals of care, counseling/discussion 11/17/2017   High risk medication use 05/29/2016   04/29/2016: ==> plq 200 am & 100 qhs(adeq response).   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[place]) 11/04/2018   Other fatigue 05/29/2016   Pericardial effusion 10/23/2020   Primary malignant neoplasm of bronchus of right lower lobe ([place]) 12/21/2017   Rheumatoid arthritis (HCC)    Rheumatoid arthritis involving multiple sites with positive  rheumatoid factor ([place]) 05/29/2016   +RF +ANA +CCP    Smoker 05/29/2016   Trigger finger, left ring finger 05/29/2016   Trigger finger, right ring finger 05/29/2016   Vitamin D deficiency 05/29/2016    ALLERGIES:  has No Known Allergies.  MEDICATIONS:  Current  Outpatient Medications  Medication Sig Dispense Refill   diclofenac sodium (VOLTAREN) 1 % GEL Apply 2 g topically 4 (four) times daily.     Fluocinolone Acetonide Scalp 0.01 % OIL Apply 1 application topically daily.     folic acid (FOLVITE) 1 MG tablet Take 1 mg by mouth daily.     hydroxychloroquine (PLAQUENIL) 200 MG tablet TAKE 1 TABLET BY MOUTH TWICE DAILY MONDAY THROUGH FRIDAY ONLY. 120 tablet 0   omeprazole (PRILOSEC) 20 MG capsule Take 20 mg by mouth daily.     prochlorperazine (COMPAZINE) 10 MG tablet Take 10 mg by mouth every 6 (six) hours as needed for nausea/vomiting.     vitamin C (ASCORBIC ACID) 500 MG tablet Take 500 mg by mouth daily.     VITAMIN D PO Take 1 tablet by mouth daily.     No current facility-administered medications for this visit.    SURGICAL HISTORY:  Past Surgical History:  Procedure Laterality Date   BRONCHIAL NEEDLE ASPIRATION BIOPSY  11/09/2017   Procedure: BRONCHIAL NEEDLE ASPIRATION BIOPSIES;  Surgeon: Marshell Garfinkel, MD;  Location: [facility];  Service: Cardiopulmonary;;   ENDOBRONCHIAL ULTRASOUND Bilateral 11/09/2017   Procedure: ENDOBRONCHIAL ULTRASOUND;  Surgeon: Marshell Garfinkel, MD;  Location: [facility];  Service: Cardiopulmonary;  Laterality: Bilateral;   NASAL SINUS SURGERY     NASAL SINUS SURGERY     TUBAL LIGATION     VIDEO BRONCHOSCOPY  11/09/2017   Procedure: VIDEO BRONCHOSCOPY;  Surgeon: Marshell Garfinkel, MD;  Location: [facility];  Service: Cardiopulmonary;;    REVIEW OF SYSTEMS:  Constitutional: positive for fatigue Eyes: negative Ears, nose, mouth, throat, and face: negative Respiratory: positive for dyspnea on exertion Cardiovascular: negative Gastrointestinal: negative Genitourinary:negative Integument/breast: negative Hematologic/lymphatic: negative Musculoskeletal:negative Neurological: negative Behavioral/Psych: negative Endocrine: negative Allergic/Immunologic: negative   PHYSICAL EXAMINATION: General appearance:  alert, cooperative, fatigued, and no distress Head: Normocephalic, without obvious abnormality, atraumatic Neck: no adenopathy, no JVD, supple, symmetrical, trachea midline, and thyroid not enlarged, symmetric, no tenderness/mass/nodules Lymph nodes: Cervical, supraclavicular, and axillary nodes normal. Resp: diminished breath sounds RLL and dullness to percussion RLL Back: symmetric, no curvature. ROM normal. No CVA tenderness. Cardio: regular rate and rhythm, S1, S2 normal, no murmur, click, rub or gallop GI: soft, non-tender; bowel sounds normal; no masses,  no organomegaly Extremities: extremities normal, atraumatic, no cyanosis or edema Neurologic: Alert and oriented X 3, normal strength and tone. Normal symmetric reflexes. Normal coordination and gait  ECOG PERFORMANCE STATUS: 1 - Symptomatic but completely ambulatory  Blood pressure (!) 150/72, pulse (!) 117, temperature (!) 97.1 F (36.2 C), temperature source Tympanic, resp. rate 18, height 5' (1.524 m), weight 107 lb 1.6 oz (48.6 kg), last menstrual period 04/28/2000, SpO2 100 %.  LABORATORY DATA: Lab Results  Component Value Date   WBC 8.9 01/08/2021   HGB 9.7 (L) 01/08/2021   HCT 30.3 (L) 01/08/2021   MCV 104.5 (H) 01/08/2021   PLT 521 (H) 01/08/2021      Chemistry      Component Value Date/Time   NA 141 12/18/2020 0935   K 4.0 12/18/2020 0935   CL 104 12/18/2020 0935   CO2 25 12/18/2020 0935   BUN 13 12/18/2020 0935  CREATININE 1.13 (H) 12/18/2020 0935   CREATININE 0.80 07/24/2017 1438      Component Value Date/Time   CALCIUM 11.1 (H) 12/18/2020 0935   ALKPHOS 70 12/18/2020 0935   AST 23 12/18/2020 0935   ALT 9 12/18/2020 0935   BILITOT 0.2 (L) 12/18/2020 0935       RADIOGRAPHIC STUDIES: CT Chest W Contrast  Result Date: 01/08/2021 CLINICAL DATA:  Non-small cell lung cancer restaging, ongoing chemotherapy EXAM: CT CHEST, ABDOMEN, AND PELVIS WITH CONTRAST TECHNIQUE: Multidetector CT imaging of the chest,  abdomen and pelvis was performed following the standard protocol during bolus administration of intravenous contrast. CONTRAST:  70m OMNIPAQUE IOHEXOL 350 MG/ML SOLN, additional oral enteric contrast COMPARISON:  10/09/2020 FINDINGS: CT CHEST FINDINGS Cardiovascular: Aortic atherosclerosis. Normal heart size. Unchanged, moderate pericardial effusion. Mediastinum/Nodes: Unchanged soft tissue thickening about the right hilum and lower trachea, without discretely enlarged mediastinal lymph nodes. Thyroid gland, trachea, and esophagus demonstrate no significant findings. Lungs/Pleura: Moderate right, small left pleural effusions and associated atelectasis or consolidation, increased compared to prior examination. Moderate centrilobular emphysema. Unchanged post radiation/post treatment appearance of the perihilar right lung (series 6, image 57). Additional bland appearing, bandlike scarring of the bilateral lung bases. Unchanged 4 mm pulmonary nodule of the left lower lobe (series 6, image 75). Musculoskeletal: No chest wall mass or suspicious bone lesions identified. CT ABDOMEN PELVIS FINDINGS Hepatobiliary: No solid liver abnormality is seen. No gallstones, gallbladder wall thickening, or biliary dilatation. Pancreas: Unremarkable. No pancreatic ductal dilatation or surrounding inflammatory changes. Spleen: Normal in size without significant abnormality. Adrenals/Urinary Tract: Adrenal glands are unremarkable. Kidneys are normal, without renal calculi, solid lesion, or hydronephrosis. Bladder is unremarkable. Stomach/Bowel: Stomach is within normal limits. Appendix appears normal. No evidence of bowel wall thickening, distention, or inflammatory changes. Vascular/Lymphatic: Aortic atherosclerosis. No enlarged abdominal or pelvic lymph nodes. Reproductive: No mass or other abnormality. Other: No abdominal wall hernia or abnormality. No abdominopelvic ascites. Musculoskeletal: No acute or significant osseous findings.  IMPRESSION: 1. Unchanged post radiation/post treatment appearance of the perihilar right lung. 2. Unchanged post treatment soft tissue thickening about the right hilum and lower trachea, without discretely enlarged mediastinal lymph nodes. 3. Moderate right, small left pleural effusions and associated atelectasis or consolidation, increased compared to prior examination. There is no direct evidence of pleural metastatic disease. 4. Unchanged 4 mm pulmonary nodule of the left lower lobe, nonspecific although likely benign and incidental sequelae of infection or inflammation. Attention on follow-up. 5. No evidence of distant metastatic disease in the abdomen or pelvis. 6. Unchanged, moderate pericardial effusion. 7. Emphysema. Aortic Atherosclerosis (ICD10-I70.0) and Emphysema (ICD10-J43.9). Electronically Signed   By: AEddie CandleM.D.   On: 01/08/2021 08:39   CT Abdomen Pelvis W Contrast  Result Date: 01/08/2021 CLINICAL DATA:  Non-small cell lung cancer restaging, ongoing chemotherapy EXAM: CT CHEST, ABDOMEN, AND PELVIS WITH CONTRAST TECHNIQUE: Multidetector CT imaging of the chest, abdomen and pelvis was performed following the standard protocol during bolus administration of intravenous contrast. CONTRAST:  863mOMNIPAQUE IOHEXOL 350 MG/ML SOLN, additional oral enteric contrast COMPARISON:  10/09/2020 FINDINGS: CT CHEST FINDINGS Cardiovascular: Aortic atherosclerosis. Normal heart size. Unchanged, moderate pericardial effusion. Mediastinum/Nodes: Unchanged soft tissue thickening about the right hilum and lower trachea, without discretely enlarged mediastinal lymph nodes. Thyroid gland, trachea, and esophagus demonstrate no significant findings. Lungs/Pleura: Moderate right, small left pleural effusions and associated atelectasis or consolidation, increased compared to prior examination. Moderate centrilobular emphysema. Unchanged post radiation/post treatment appearance of the perihilar right lung (series  6,  image 57). Additional bland appearing, bandlike scarring of the bilateral lung bases. Unchanged 4 mm pulmonary nodule of the left lower lobe (series 6, image 75). Musculoskeletal: No chest wall mass or suspicious bone lesions identified. CT ABDOMEN PELVIS FINDINGS Hepatobiliary: No solid liver abnormality is seen. No gallstones, gallbladder wall thickening, or biliary dilatation. Pancreas: Unremarkable. No pancreatic ductal dilatation or surrounding inflammatory changes. Spleen: Normal in size without significant abnormality. Adrenals/Urinary Tract: Adrenal glands are unremarkable. Kidneys are normal, without renal calculi, solid lesion, or hydronephrosis. Bladder is unremarkable. Stomach/Bowel: Stomach is within normal limits. Appendix appears normal. No evidence of bowel wall thickening, distention, or inflammatory changes. Vascular/Lymphatic: Aortic atherosclerosis. No enlarged abdominal or pelvic lymph nodes. Reproductive: No mass or other abnormality. Other: No abdominal wall hernia or abnormality. No abdominopelvic ascites. Musculoskeletal: No acute or significant osseous findings. IMPRESSION: 1. Unchanged post radiation/post treatment appearance of the perihilar right lung. 2. Unchanged post treatment soft tissue thickening about the right hilum and lower trachea, without discretely enlarged mediastinal lymph nodes. 3. Moderate right, small left pleural effusions and associated atelectasis or consolidation, increased compared to prior examination. There is no direct evidence of pleural metastatic disease. 4. Unchanged 4 mm pulmonary nodule of the left lower lobe, nonspecific although likely benign and incidental sequelae of infection or inflammation. Attention on follow-up. 5. No evidence of distant metastatic disease in the abdomen or pelvis. 6. Unchanged, moderate pericardial effusion. 7. Emphysema. Aortic Atherosclerosis (ICD10-I70.0) and Emphysema (ICD10-J43.9). Electronically Signed   By: Eddie Candle  M.D.   On: 01/08/2021 08:39    ASSESSMENT AND PLAN: This is a very pleasant 65 years old African-American female with metastatic non-small cell lung cancer initially diagnosed with a stage IIIA non-small cell lung cancer, adenocarcinoma.  She underwent a course of concurrent chemoradiation with weekly carboplatin and paclitaxel status post 7 cycles with partial response.   The patient tolerated this course of treatment well except for mild odynophagia and dysphagia. She completed on consolidation treatment with immunotherapy with Imfinzi (Durvalumab) status post 18 cycles. She also completed SBRT to the right supraclavicular lymphadenopathy. The patient had evidence for multiple brain metastasis in October 2020 and she underwent SRS treatment to this lesion under the care of Dr. Lisbeth Renshaw. The patient had evidence for disease progression and she started systemic chemotherapy with carboplatin, Alimta and Keytruda status post 24 cycles.  Starting from cycle #5 she is on maintenance treatment with Alimta and Keytruda every 3 weeks. The patient continues to tolerate her treatment with maintenance Alimta and Keytruda fairly well except for fatigue. She had repeat CT scan of the chest, abdomen pelvis performed recently.  I personally and independently reviewed the scan images and discussed the results with the patient and showed her the images today. Her scan showed no concerning findings for disease progression but she continues to have moderately enlarged right pleural effusion as well as moderate pericardial effusion.  The patient is tachycardic secondary to her pericardial effusion. I recommended for her to proceed with cycle #25 of her treatment today as planned. Regarding the right pleural effusion, I will refer the patient to interventional radiology for ultrasound-guided right thoracentesis. For the pericardial effusion, I will reach out to Dr. Kipp Brood and Dr. Geraldo Pitter to see the next step of  intervention to relieve her pericardial effusion. The patient was advised to call immediately if she has any other concerning symptoms in the interval. The patient voices understanding of current disease status and treatment options and is in agreement  with the current care plan. All questions were answered. The patient knows to call the clinic with any problems, questions or concerns. We can certainly see the patient much sooner if necessary.  Disclaimer: This note was dictated with voice recognition software. Similar sounding words can inadvertently be transcribed and may not be corrected upon review.

## 2021-01-08 NOTE — Patient Instructions (Signed)
[facility]  Discharge Instructions: Thank you for choosing [facility] to provide your oncology and hematology care.   If you have a lab appointment with the [facility], please go directly to the [facility] and check in at the registration area.   Wear comfortable clothing and clothing appropriate for easy access to any Portacath or PICC line.   We strive to give you quality time with your provider. You may need to reschedule your appointment if you arrive late (15 or more minutes).  Arriving late affects you and other patients whose appointments are after yours.  Also, if you miss three or more appointments without notifying the office, you may be dismissed from the clinic at the provider's discretion.      For prescription refill requests, have your pharmacy contact our office and allow 72 hours for refills to be completed.    Today you received the following chemotherapy and/or immunotherapy agents : Keytruda, Alimta   To help prevent nausea and vomiting after your treatment, we encourage you to take your nausea medication as directed.  BELOW ARE SYMPTOMS THAT SHOULD BE REPORTED IMMEDIATELY: *FEVER GREATER THAN 100.4 F (38 C) OR HIGHER *CHILLS OR SWEATING *NAUSEA AND VOMITING THAT IS NOT CONTROLLED WITH YOUR NAUSEA MEDICATION *UNUSUAL SHORTNESS OF BREATH *UNUSUAL BRUISING OR BLEEDING *URINARY PROBLEMS (pain or burning when urinating, or frequent urination) *BOWEL PROBLEMS (unusual diarrhea, constipation, pain near the anus) TENDERNESS IN MOUTH AND THROAT WITH OR WITHOUT PRESENCE OF ULCERS (sore throat, sores in mouth, or a toothache) UNUSUAL RASH, SWELLING OR PAIN  UNUSUAL VAGINAL DISCHARGE OR ITCHING   Items with * indicate a potential emergency and should be followed up as soon as possible or go to the Emergency Department if any problems should occur.  Please show the CHEMOTHERAPY ALERT CARD or IMMUNOTHERAPY ALERT CARD at  check-in to the Emergency Department and triage nurse.  Should you have questions after your visit or need to cancel or reschedule your appointment, please contact [facility]  Dept: [phone]  and follow the prompts.  Office hours are 8:00 a.m. to 4:30 p.m. Monday - Friday. Please note that voicemails left after 4:00 p.m. may not be returned until the following business day.  We are closed weekends and major holidays. You have access to a nurse at all times for urgent questions. Please call the main number to the clinic Dept: [phone] and follow the prompts.   For any non-urgent questions, you may also contact your provider using MyChart. We now offer e-Visits for anyone 67 and older to request care online for non-urgent symptoms. For details visit [email or web address].   Also download the MyChart app! Go to the app store, search "MyChart", open the app, select [facility], and log in with your MyChart username and password.  Due to Covid, a mask is required upon entering the hospital/clinic. If you do not have a mask, one will be given to you upon arrival. For doctor visits, patients may have 1 support person aged 67 or older with them. For treatment visits, patients cannot have anyone with them due to current Covid guidelines and our immunocompromised population.

## 2021-01-08 NOTE — Progress Notes (Signed)
Ok to treat with elevated HR per Dr.Mohamed

## 2021-01-08 NOTE — Telephone Encounter (Signed)
Patient said she saw Dr. Kipp Brood but she is not sure what the next steps would be. The patient said Dr. Geraldo Pitter should be the one to decide what to do about the fluid around her heart.  Please advise

## 2021-01-10 ENCOUNTER — Telehealth: Payer: Self-pay | Admitting: Internal Medicine

## 2021-01-10 NOTE — Telephone Encounter (Signed)
Scheduled appt per 9/13 los- patient to get an updated schedule next visit.

## 2021-01-11 DIAGNOSIS — C3490 Malignant neoplasm of unspecified part of unspecified bronchus or lung: Secondary | ICD-10-CM | POA: Diagnosis not present

## 2021-01-15 ENCOUNTER — Other Ambulatory Visit: Payer: Self-pay | Admitting: Internal Medicine

## 2021-01-22 NOTE — Progress Notes (Signed)
[place] OFFICE PROGRESS NOTE  Pcp, No No address on file  DIAGNOSIS: Metastatic non-small cell lung cancer initially diagnosed as stage IIIA (T3, N1/N2, M0) non-small cell lung cancer, adenocarcinoma presented with large right lower lobe lung mass with extension to the right hilum and subcarinal area diagnosed in July 2019.  She has brain metastasis in October 2020.   Biomarker Findings Tumor Mutational Burden - TMB-Intermediate (6 Muts/Mb) Microsatellite status - MS-Stable Genomic Findings For a complete list of the genes assayed, please refer to the Appendix. NRAS Q61R ARAF amplification STK11 G56W KRAS G13D MYCN amplification MCL1 amplification NKX2-1 amplification - equivocal? TP53 G245V 7 Disease relevant genes with no reportable alterations: EGFR, ALK, BRAF, MET, ERBB2, RET, ROS1     PRIOR THERAPY: 1) Course of concurrent chemoradiation with weekly carboplatin for AUC of 2 and paclitaxel 45 mg/M2.  Status post 7 cycles.  Last dose was giving 01/11/2018. 2) Consolidation treatment with immunotherapy with Imfinzi (Durvalumab) 10 mg/KG every 2 weeks.  First dose February 09, 2018.  Status post 19 cycles. 3) status post stereotactic body radiotherapy to the enlarging right supraclavicular lymphadenopathy under the care of Dr. Lisbeth Renshaw. 4) SRS to multiple brain metastasis under the care of Dr. Lisbeth Renshaw.  CURRENT THERAPY: Systemic chemotherapy with carboplatin for AUC of 5, Alimta 500 mg/M2 and Keytruda 200 mg IV every 3 weeks.  First dose 08/16/2019.  Status post 25 cycles.  Starting from cycle #5 the patient is on maintenance treatment with Alimta and Keytruda every 3 weeks. Alimta was reduced to 400 mg/m2 starting from cycle #21.  INTERVAL HISTORY: Susan Holder 65 y.o. female returns to clinic today for a follow-up visit.  In the interval since her last appointment, she saw Dr. Kipp Brood from cardiothoracic surgery 01/25/2021 to discuss her pericardial effusion and  tachycardia.  They are planning to perform a robot assisted thoracoscopy and pericardial window thoracentesis on 02/07/21.   Otherwise, she tolerated her last cycle of chemotherapy fairly well without any concerning complaints except for fatigue.  She denies any fever, chills, or night sweats. She lost 2 lbs. She recently saw a member of the nutritionist team at her last appointment. They recommended drinking boost/ensure which she has not done yet. She denies any chest pain or hemoptysis.  She denies any significant shortness of breath with exertion and less climbing stairs.  She reports her baseline dry cough which has been similar for several years.  She has not used any OTC cough medications. She denies any nausea, or vomiting except for some mild nausea which is controlled with her antiemetic one week following treatment.  She denies any diarrhea or constipation.  She is followed closely by radiation oncology for history of metastatic disease to the brain.  She has a routine follow-up brain MRI scheduled next month on 03/08/21.  She is here today for repeat blood work and evaluation before starting cycle #26.    MEDICAL HISTORY: Past Medical History:  Diagnosis Date   Acid reflux 09/21/2018   Adenocarcinoma of right lung, stage 4 ([place]) 11/17/2017   Allergic rhinitis 08/22/2013   ANA positive 05/29/2016   Aortic atherosclerosis ([place]) 10/23/2020   Brain metastases ([place]) 03/29/2019   Coronary artery calcification seen on CT scan 10/23/2020   DDD (degenerative disc disease), cervical 05/29/2016   Encounter for antineoplastic chemotherapy 11/17/2017   Encounter for antineoplastic immunotherapy 02/01/2018   Goals of care, counseling/discussion 11/17/2017   High risk medication use 05/29/2016   04/29/2016: ==> plq 200  am & 100 qhs(adeq response).   Hoarseness 03/16/2018   Left hand pain 03/06/2017   Lung mass    met lung ca dx'd 09/2017   neck LN and brain 2020   Metastasis to supraclavicular lymph node  ([place]) 11/04/2018   Other fatigue 05/29/2016   Pericardial effusion 10/23/2020   Primary malignant neoplasm of bronchus of right lower lobe ([place]) 12/21/2017   Rheumatoid arthritis (HCC)    Rheumatoid arthritis involving multiple sites with positive rheumatoid factor ([place]) 05/29/2016   +RF +ANA +CCP    Smoker 05/29/2016   Trigger finger, left ring finger 05/29/2016   Trigger finger, right ring finger 05/29/2016   Vitamin D deficiency 05/29/2016    ALLERGIES:  has No Known Allergies.  MEDICATIONS:  Current Outpatient Medications  Medication Sig Dispense Refill   diclofenac sodium (VOLTAREN) 1 % GEL Apply 2 g topically 4 (four) times daily.     Fluocinolone Acetonide Scalp 0.01 % OIL Apply 1 application topically daily.     folic acid (FOLVITE) 1 MG tablet Take 1 mg by mouth daily.     hydroxychloroquine (PLAQUENIL) 200 MG tablet TAKE 1 TABLET BY MOUTH TWICE DAILY MONDAY THROUGH FRIDAY ONLY. 120 tablet 0   omeprazole (PRILOSEC) 20 MG capsule Take 20 mg by mouth daily.     prochlorperazine (COMPAZINE) 10 MG tablet TAKE 1 TABLET(10 MG) BY MOUTH EVERY 6 HOURS AS NEEDED FOR NAUSEA OR VOMITING 30 tablet 2   vitamin C (ASCORBIC ACID) 500 MG tablet Take 500 mg by mouth daily.     VITAMIN D PO Take 1 tablet by mouth daily.     No current facility-administered medications for this visit.    SURGICAL HISTORY:  Past Surgical History:  Procedure Laterality Date   BRONCHIAL NEEDLE ASPIRATION BIOPSY  11/09/2017   Procedure: BRONCHIAL NEEDLE ASPIRATION BIOPSIES;  Surgeon: Marshell Garfinkel, MD;  Location: [facility];  Service: Cardiopulmonary;;   ENDOBRONCHIAL ULTRASOUND Bilateral 11/09/2017   Procedure: ENDOBRONCHIAL ULTRASOUND;  Surgeon: Marshell Garfinkel, MD;  Location: [facility];  Service: Cardiopulmonary;  Laterality: Bilateral;   NASAL SINUS SURGERY     NASAL SINUS SURGERY     TUBAL LIGATION     VIDEO BRONCHOSCOPY  11/09/2017   Procedure: VIDEO BRONCHOSCOPY;  Surgeon: Marshell Garfinkel, MD;  Location: [facility]  END[facility];  Service: Cardiopulmonary;;    REVIEW OF SYSTEMS:   Constitutional: Positive for fatigue. Positive for 2 lb weight loss. Negative for appetite change, chills, fever and unexpected weight change. HENT: Positive for dry mouth. Negative for mouth sores, nosebleeds, sore throat and trouble swallowing.   Eyes: Negative for eye problems and icterus. Respiratory: Positive for baseline cough. Negative for hemoptysis, shortness of breath (unless climbing stairs) and wheezing.   Cardiovascular: Negative for chest pain and leg swelling. Gastrointestinal: Positive for occasional nausea. Negative for abdominal pain, vomiting, constipation, or diarrhea.. Genitourinary: Negative for bladder incontinence, difficulty urinating, dysuria, frequency and hematuria.   Musculoskeletal: Negative for back pain, gait problem, neck pain and neck stiffness. Skin: Negative for itching and rash. Neurological: Negative for dizziness, extremity weakness, gait problem, headaches, light-headedness and seizures. Hematological: Negative for adenopathy. Does not bruise/bleed easily. Psychiatric/Behavioral: Negative for confusion, depression and sleep disturbance. The patient is not nervous/anxious.   PHYSICAL EXAMINATION:  Blood pressure (!) 147/72, pulse (!) 118, temperature (!) 97.5 F (36.4 C), temperature source Tympanic, resp. rate 18, height 5' (1.524 m), weight 105 lb 3.2 oz (47.7 kg), last menstrual period 04/28/2000, SpO2 100 %.  ECOG PERFORMANCE STATUS: 1  Physical Exam  Constitutional: Oriented to person, place, and time and well-developed, well-nourished, and in no distress.  HENT:  Head: Normocephalic and atraumatic.  Mouth/Throat: Oropharynx is clear and moist. No oropharyngeal exudate.  Eyes: Conjunctivae are normal. Right eye exhibits no discharge. Left eye exhibits no discharge. No scleral icterus.  Neck: Normal range of motion. Neck supple.  Cardiovascular: Normal rate, regular rhythm, normal  heart sounds and intact distal pulses.   Pulmonary/Chest: Effort normal and breath sounds normal. No respiratory distress. No wheezes. No rales.  Abdominal: Soft. Bowel sounds are normal. Exhibits no distension and no mass. There is no tenderness.  Musculoskeletal: Normal range of motion. Exhibits no edema.  Lymphadenopathy:    No cervical adenopathy.  Neurological: Alert and oriented to person, place, and time. Exhibits normal muscle tone. Gait normal. Coordination normal.  Skin: Skin is warm and dry. No rash noted. Not diaphoretic. No erythema. No pallor.  Psychiatric: Mood, memory and judgment normal.  Vitals reviewed.  LABORATORY DATA: Lab Results  Component Value Date   WBC 10.2 01/30/2021   HGB 8.9 (L) 01/30/2021   HCT 28.5 (L) 01/30/2021   MCV 108.0 (H) 01/30/2021   PLT 572 (H) 01/30/2021      Chemistry      Component Value Date/Time   NA 143 01/30/2021 0912   K 4.1 01/30/2021 0912   CL 106 01/30/2021 0912   CO2 24 01/30/2021 0912   BUN 10 01/30/2021 0912   CREATININE 1.21 (H) 01/30/2021 0912   CREATININE 0.80 07/24/2017 1438      Component Value Date/Time   CALCIUM 10.5 (H) 01/30/2021 0912   ALKPHOS 79 01/30/2021 0912   AST 31 01/30/2021 0912   ALT 9 01/30/2021 0912   BILITOT <0.2 (L) 01/30/2021 0912       RADIOGRAPHIC STUDIES:  CT Chest W Contrast  Result Date: 01/08/2021 CLINICAL DATA:  Non-small cell lung cancer restaging, ongoing chemotherapy EXAM: CT CHEST, ABDOMEN, AND PELVIS WITH CONTRAST TECHNIQUE: Multidetector CT imaging of the chest, abdomen and pelvis was performed following the standard protocol during bolus administration of intravenous contrast. CONTRAST:  66m OMNIPAQUE IOHEXOL 350 MG/ML SOLN, additional oral enteric contrast COMPARISON:  10/09/2020 FINDINGS: CT CHEST FINDINGS Cardiovascular: Aortic atherosclerosis. Normal heart size. Unchanged, moderate pericardial effusion. Mediastinum/Nodes: Unchanged soft tissue thickening about the right  hilum and lower trachea, without discretely enlarged mediastinal lymph nodes. Thyroid gland, trachea, and esophagus demonstrate no significant findings. Lungs/Pleura: Moderate right, small left pleural effusions and associated atelectasis or consolidation, increased compared to prior examination. Moderate centrilobular emphysema. Unchanged post radiation/post treatment appearance of the perihilar right lung (series 6, image 57). Additional bland appearing, bandlike scarring of the bilateral lung bases. Unchanged 4 mm pulmonary nodule of the left lower lobe (series 6, image 75). Musculoskeletal: No chest wall mass or suspicious bone lesions identified. CT ABDOMEN PELVIS FINDINGS Hepatobiliary: No solid liver abnormality is seen. No gallstones, gallbladder wall thickening, or biliary dilatation. Pancreas: Unremarkable. No pancreatic ductal dilatation or surrounding inflammatory changes. Spleen: Normal in size without significant abnormality. Adrenals/Urinary Tract: Adrenal glands are unremarkable. Kidneys are normal, without renal calculi, solid lesion, or hydronephrosis. Bladder is unremarkable. Stomach/Bowel: Stomach is within normal limits. Appendix appears normal. No evidence of bowel wall thickening, distention, or inflammatory changes. Vascular/Lymphatic: Aortic atherosclerosis. No enlarged abdominal or pelvic lymph nodes. Reproductive: No mass or other abnormality. Other: No abdominal wall hernia or abnormality. No abdominopelvic ascites. Musculoskeletal: No acute or significant osseous findings. IMPRESSION: 1. Unchanged post radiation/post treatment appearance of  the perihilar right lung. 2. Unchanged post treatment soft tissue thickening about the right hilum and lower trachea, without discretely enlarged mediastinal lymph nodes. 3. Moderate right, small left pleural effusions and associated atelectasis or consolidation, increased compared to prior examination. There is no direct evidence of pleural  metastatic disease. 4. Unchanged 4 mm pulmonary nodule of the left lower lobe, nonspecific although likely benign and incidental sequelae of infection or inflammation. Attention on follow-up. 5. No evidence of distant metastatic disease in the abdomen or pelvis. 6. Unchanged, moderate pericardial effusion. 7. Emphysema. Aortic Atherosclerosis (ICD10-I70.0) and Emphysema (ICD10-J43.9). Electronically Signed   By: Eddie Candle M.D.   On: 01/08/2021 08:39   CT Abdomen Pelvis W Contrast  Result Date: 01/08/2021 CLINICAL DATA:  Non-small cell lung cancer restaging, ongoing chemotherapy EXAM: CT CHEST, ABDOMEN, AND PELVIS WITH CONTRAST TECHNIQUE: Multidetector CT imaging of the chest, abdomen and pelvis was performed following the standard protocol during bolus administration of intravenous contrast. CONTRAST:  59m OMNIPAQUE IOHEXOL 350 MG/ML SOLN, additional oral enteric contrast COMPARISON:  10/09/2020 FINDINGS: CT CHEST FINDINGS Cardiovascular: Aortic atherosclerosis. Normal heart size. Unchanged, moderate pericardial effusion. Mediastinum/Nodes: Unchanged soft tissue thickening about the right hilum and lower trachea, without discretely enlarged mediastinal lymph nodes. Thyroid gland, trachea, and esophagus demonstrate no significant findings. Lungs/Pleura: Moderate right, small left pleural effusions and associated atelectasis or consolidation, increased compared to prior examination. Moderate centrilobular emphysema. Unchanged post radiation/post treatment appearance of the perihilar right lung (series 6, image 57). Additional bland appearing, bandlike scarring of the bilateral lung bases. Unchanged 4 mm pulmonary nodule of the left lower lobe (series 6, image 75). Musculoskeletal: No chest wall mass or suspicious bone lesions identified. CT ABDOMEN PELVIS FINDINGS Hepatobiliary: No solid liver abnormality is seen. No gallstones, gallbladder wall thickening, or biliary dilatation. Pancreas: Unremarkable. No  pancreatic ductal dilatation or surrounding inflammatory changes. Spleen: Normal in size without significant abnormality. Adrenals/Urinary Tract: Adrenal glands are unremarkable. Kidneys are normal, without renal calculi, solid lesion, or hydronephrosis. Bladder is unremarkable. Stomach/Bowel: Stomach is within normal limits. Appendix appears normal. No evidence of bowel wall thickening, distention, or inflammatory changes. Vascular/Lymphatic: Aortic atherosclerosis. No enlarged abdominal or pelvic lymph nodes. Reproductive: No mass or other abnormality. Other: No abdominal wall hernia or abnormality. No abdominopelvic ascites. Musculoskeletal: No acute or significant osseous findings. IMPRESSION: 1. Unchanged post radiation/post treatment appearance of the perihilar right lung. 2. Unchanged post treatment soft tissue thickening about the right hilum and lower trachea, without discretely enlarged mediastinal lymph nodes. 3. Moderate right, small left pleural effusions and associated atelectasis or consolidation, increased compared to prior examination. There is no direct evidence of pleural metastatic disease. 4. Unchanged 4 mm pulmonary nodule of the left lower lobe, nonspecific although likely benign and incidental sequelae of infection or inflammation. Attention on follow-up. 5. No evidence of distant metastatic disease in the abdomen or pelvis. 6. Unchanged, moderate pericardial effusion. 7. Emphysema. Aortic Atherosclerosis (ICD10-I70.0) and Emphysema (ICD10-J43.9). Electronically Signed   By: AEddie CandleM.D.   On: 01/08/2021 08:39     ASSESSMENT/PLAN:  This is a very pleasant 65year old African American female with metastatic non-small cell lung cancer, adenocarcinoma initially diagnosed as IIIA in July 2019. She had evidence of metastatic disease with brain metastases in October 2020. She has no actionable mutations.    She is status post 7 cycles of concurrent chemoradiation with carboplatin for an  AUC of 2 and paclitaxel 45 mg/m2. She had a partial response.   She  then underwent consolidation immunotherapy with imfinzi. She is status post 18 cycles.   She completed SBRT to the right supraclavicular lymph node.   She had evidence of multiple brain metastases in October 2020. She underwent SRS treatment to these lesions under the care of Dr. Lisbeth Renshaw.  She currently is undergoing systemic chemotherapy with carboplatin for an AUC of 5, Alimta 500 mg/m2 and Keytruda 200 mg IV every 3 weeks. She is status post 25 cycles. Starting from cycle #5 she started maintenance Alimta and Keytruda. Starting from cycle #21, Dr. Julien Nordmann reduced her alimta to 400 mg/m2 due to fatigue.   Labs are reviewed.  Recommend that she proceed with cycle #26 today scheduled.  We will see her back for follow-up visit in 3 weeks for evaluation before starting cycle #27.  Advised to use ensure or boost to maintain her weight. She was given coupons today.   She will have her procedure performed next week as scheduled. I reviewed this with Dr. Julien Nordmann who stated she is ok to proceed with her procedure as planned on 02/07/21.   Advised to use delsym or robitussin if needed for her cough.   The patient was advised to call immediately if she has any concerning symptoms in the interval. The patient voices understanding of current disease status and treatment options and is in agreement with the current care plan. All questions were answered. The patient knows to call the clinic with any problems, questions or concerns. We can certainly see the patient much sooner if necessary            No orders of the defined types were placed in this encounter.    The total time spent in the appointment was 20-29 minutes.   Maryetta Shafer L Nhat Hearne, PA-C 01/30/21

## 2021-01-25 ENCOUNTER — Other Ambulatory Visit: Payer: Self-pay | Admitting: *Deleted

## 2021-01-25 ENCOUNTER — Other Ambulatory Visit: Payer: Self-pay

## 2021-01-25 ENCOUNTER — Encounter: Payer: Self-pay | Admitting: Thoracic Surgery (Cardiothoracic Vascular Surgery)

## 2021-01-25 ENCOUNTER — Ambulatory Visit: Payer: Medicare PPO | Admitting: Thoracic Surgery (Cardiothoracic Vascular Surgery)

## 2021-01-25 ENCOUNTER — Encounter: Payer: Self-pay | Admitting: *Deleted

## 2021-01-25 VITALS — BP 149/71 | HR 122 | Resp 20 | Ht 60.0 in | Wt 107.0 lb

## 2021-01-25 DIAGNOSIS — I3139 Other pericardial effusion (noninflammatory): Secondary | ICD-10-CM

## 2021-01-25 DIAGNOSIS — I313 Pericardial effusion (noninflammatory): Secondary | ICD-10-CM

## 2021-01-25 NOTE — H&P (View-Only) (Signed)
    [address]       [place], [address]             [phone]                    Susan Holder [facility]bers Medical Record #[id number] Date of Birth: 11/25/1955  Referring: Revankar, Rajan R, MD Primary Care: Pcp, No Primary Cardiologist: None  Chief Complaint:    Chief Complaint  Patient presents with   Follow-up    Further discuss surgery    History of Present Illness:    Susan Holder 65 y.o. female with a history of metastatic non-small cell lung cancer on the right presents for follow-up in regards to moderate sized pericardial effusion, and a right-sided pleural effusion.  She continues to have some tachycardia as well as some exertional dyspnea.  She would like to proceed with surgical drainage of these effusions.      Zubrod Score: At the time of surgery this patient's most appropriate activity status/level should be described as: []    0    Normal activity, no symptoms [x]    1    Restricted in physical strenuous activity but ambulatory, able to do out light work []    2    Ambulatory and capable of self care, unable to do work activities, up and about               >50 % of waking hours                              []    3    Only limited self care, in bed greater than 50% of waking hours []    4    Completely disabled, no self care, confined to bed or chair []    5    Moribund   Past Medical History:  Diagnosis Date   Acid reflux 09/21/2018   Adenocarcinoma of right lung, stage 4 (HCC) 11/17/2017   Allergic rhinitis 08/22/2013   ANA positive 05/29/2016   Aortic atherosclerosis (HCC) 10/23/2020   Brain metastases (HCC) 03/29/2019   Coronary artery calcification seen on CT scan 10/23/2020   DDD (degenerative disc disease), cervical 05/29/2016   Encounter for antineoplastic chemotherapy 11/17/2017   Encounter for antineoplastic immunotherapy 02/01/2018   Goals of care, counseling/discussion 11/17/2017   High risk medication use 05/29/2016    04/29/2016: ==> plq 200 am & 100 qhs(adeq response).   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) 11/04/2018   Other fatigue 05/29/2016   Pericardial effusion 10/23/2020   Primary malignant neoplasm of bronchus of right lower lobe (HCC) 12/21/2017   Rheumatoid arthritis (HCC)    Rheumatoid arthritis involving multiple sites with positive rheumatoid factor (HCC) 05/29/2016   +RF +ANA +CCP    Smoker 05/29/2016   Trigger finger, left ring finger 05/29/2016   Trigger finger, right ring finger 05/29/2016   Vitamin D deficiency 05/29/2016    Past Surgical History:  Procedure Laterality Date   BRONCHIAL NEEDLE ASPIRATION BIOPSY  11/09/2017   Procedure: BRONCHIAL NEEDLE ASPIRATION BIOPSIES;  Surgeon: Mannam, Praveen, MD;  Location: [facility];  Service: Cardiopulmonary;;   ENDOBRONCHIAL ULTRASOUND Bilateral 11/09/2017   Procedure: ENDOBRONCHIAL ULTRASOUND;  Surgeon:   Mannam, Praveen, MD;  Location: [facility];  Service: Cardiopulmonary;  Laterality: Bilateral;   NASAL SINUS SURGERY     NASAL SINUS SURGERY     TUBAL LIGATION     VIDEO BRONCHOSCOPY  11/09/2017   Procedure: VIDEO BRONCHOSCOPY;  Surgeon: Mannam, Praveen, MD;  Location: [facility];  Service: Cardiopulmonary;;    Family History  Problem Relation Age of Onset   Stroke Mother    Alzheimer's disease Mother    Heart disease Mother    Emphysema Father    Hypertension Brother    Heart attack Maternal Aunt    Heart failure Maternal Grandmother    Hypertension Paternal Grandmother      Social History   Tobacco Use  Smoking Status Former   Packs/day: 0.50   Years: 45.00   Pack years: 22.50   Types: Cigarettes   Quit date: 09/29/2017   Years since quitting: 3.3  Smokeless Tobacco Never    Social History   Substance and Sexual Activity  Alcohol Use Not Currently     No Known Allergies  Current Outpatient  Medications  Medication Sig Dispense Refill   diclofenac sodium (VOLTAREN) 1 % GEL Apply 2 g topically 4 (four) times daily.     Fluocinolone Acetonide Scalp 0.01 % OIL Apply 1 application topically daily.     folic acid (FOLVITE) 1 MG tablet Take 1 mg by mouth daily.     hydroxychloroquine (PLAQUENIL) 200 MG tablet TAKE 1 TABLET BY MOUTH TWICE DAILY MONDAY THROUGH FRIDAY ONLY. 120 tablet 0   omeprazole (PRILOSEC) 20 MG capsule Take 20 mg by mouth daily.     prochlorperazine (COMPAZINE) 10 MG tablet TAKE 1 TABLET(10 MG) BY MOUTH EVERY 6 HOURS AS NEEDED FOR NAUSEA OR VOMITING 30 tablet 2   vitamin C (ASCORBIC ACID) 500 MG tablet Take 500 mg by mouth daily.     VITAMIN D PO Take 1 tablet by mouth daily.     No current facility-administered medications for this visit.      Review of Systems  Constitutional:  Positive for malaise/fatigue and weight loss.  Respiratory:  Positive for shortness of breath. Negative for cough.   Cardiovascular:  Negative for chest pain and palpitations.      PHYSICAL EXAMINATION: BP (!) 149/71 (BP Location: Left Arm, Patient Position: Sitting)   Pulse (!) 122   Resp 20   Ht 5' (1.524 m)   Wt 107 lb (48.5 kg)   LMP 04/28/2000   SpO2 98%   BMI 20.90 kg/m   Physical Exam Constitutional:      Appearance: She is normal weight. She is ill-appearing. She is not toxic-appearing.  HENT:     Head: Normocephalic.  Eyes:     Extraocular Movements: Extraocular movements intact.  Cardiovascular:     Rate and Rhythm: Tachycardia present.  Pulmonary:     Effort: Pulmonary effort is normal. No respiratory distress.  Abdominal:     General: Abdomen is flat. There is no distension.  Musculoskeletal:        General: Normal range of motion.     Cervical back: Normal range of motion.  Skin:    General: Skin is warm and dry.  Neurological:     General: No focal deficit present.     Mental Status: She is alert and oriented to person, place, and time.    Diagnostic Studies & Laboratory data:     Recent Radiology Findings:   CT Chest W Contrast  Result Date: 01/08/2021 CLINICAL   DATA:  Non-small cell lung cancer restaging, ongoing chemotherapy EXAM: CT CHEST, ABDOMEN, AND PELVIS WITH CONTRAST TECHNIQUE: Multidetector CT imaging of the chest, abdomen and pelvis was performed following the standard protocol during bolus administration of intravenous contrast. CONTRAST:  80mL OMNIPAQUE IOHEXOL 350 MG/ML SOLN, additional oral enteric contrast COMPARISON:  10/09/2020 FINDINGS: CT CHEST FINDINGS Cardiovascular: Aortic atherosclerosis. Normal heart size. Unchanged, moderate pericardial effusion. Mediastinum/Nodes: Unchanged soft tissue thickening about the right hilum and lower trachea, without discretely enlarged mediastinal lymph nodes. Thyroid gland, trachea, and esophagus demonstrate no significant findings. Lungs/Pleura: Moderate right, small left pleural effusions and associated atelectasis or consolidation, increased compared to prior examination. Moderate centrilobular emphysema. Unchanged post radiation/post treatment appearance of the perihilar right lung (series 6, image 57). Additional bland appearing, bandlike scarring of the bilateral lung bases. Unchanged 4 mm pulmonary nodule of the left lower lobe (series 6, image 75). Musculoskeletal: No chest wall mass or suspicious bone lesions identified. CT ABDOMEN PELVIS FINDINGS Hepatobiliary: No solid liver abnormality is seen. No gallstones, gallbladder wall thickening, or biliary dilatation. Pancreas: Unremarkable. No pancreatic ductal dilatation or surrounding inflammatory changes. Spleen: Normal in size without significant abnormality. Adrenals/Urinary Tract: Adrenal glands are unremarkable. Kidneys are normal, without renal calculi, solid lesion, or hydronephrosis. Bladder is unremarkable. Stomach/Bowel: Stomach is within normal limits. Appendix appears normal. No evidence of bowel wall thickening,  distention, or inflammatory changes. Vascular/Lymphatic: Aortic atherosclerosis. No enlarged abdominal or pelvic lymph nodes. Reproductive: No mass or other abnormality. Other: No abdominal wall hernia or abnormality. No abdominopelvic ascites. Musculoskeletal: No acute or significant osseous findings. IMPRESSION: 1. Unchanged post radiation/post treatment appearance of the perihilar right lung. 2. Unchanged post treatment soft tissue thickening about the right hilum and lower trachea, without discretely enlarged mediastinal lymph nodes. 3. Moderate right, small left pleural effusions and associated atelectasis or consolidation, increased compared to prior examination. There is no direct evidence of pleural metastatic disease. 4. Unchanged 4 mm pulmonary nodule of the left lower lobe, nonspecific although likely benign and incidental sequelae of infection or inflammation. Attention on follow-up. 5. No evidence of distant metastatic disease in the abdomen or pelvis. 6. Unchanged, moderate pericardial effusion. 7. Emphysema. Aortic Atherosclerosis (ICD10-I70.0) and Emphysema (ICD10-J43.9). Electronically Signed   By: Alex  Bibbey M.D.   On: 01/08/2021 08:39   CT Abdomen Pelvis W Contrast  Result Date: 01/08/2021 CLINICAL DATA:  Non-small cell lung cancer restaging, ongoing chemotherapy EXAM: CT CHEST, ABDOMEN, AND PELVIS WITH CONTRAST TECHNIQUE: Multidetector CT imaging of the chest, abdomen and pelvis was performed following the standard protocol during bolus administration of intravenous contrast. CONTRAST:  80mL OMNIPAQUE IOHEXOL 350 MG/ML SOLN, additional oral enteric contrast COMPARISON:  10/09/2020 FINDINGS: CT CHEST FINDINGS Cardiovascular: Aortic atherosclerosis. Normal heart size. Unchanged, moderate pericardial effusion. Mediastinum/Nodes: Unchanged soft tissue thickening about the right hilum and lower trachea, without discretely enlarged mediastinal lymph nodes. Thyroid gland, trachea, and esophagus  demonstrate no significant findings. Lungs/Pleura: Moderate right, small left pleural effusions and associated atelectasis or consolidation, increased compared to prior examination. Moderate centrilobular emphysema. Unchanged post radiation/post treatment appearance of the perihilar right lung (series 6, image 57). Additional bland appearing, bandlike scarring of the bilateral lung bases. Unchanged 4 mm pulmonary nodule of the left lower lobe (series 6, image 75). Musculoskeletal: No chest wall mass or suspicious bone lesions identified. CT ABDOMEN PELVIS FINDINGS Hepatobiliary: No solid liver abnormality is seen. No gallstones, gallbladder wall thickening, or biliary dilatation. Pancreas: Unremarkable. No pancreatic ductal dilatation or surrounding inflammatory changes. Spleen: Normal   in size without significant abnormality. Adrenals/Urinary Tract: Adrenal glands are unremarkable. Kidneys are normal, without renal calculi, solid lesion, or hydronephrosis. Bladder is unremarkable. Stomach/Bowel: Stomach is within normal limits. Appendix appears normal. No evidence of bowel wall thickening, distention, or inflammatory changes. Vascular/Lymphatic: Aortic atherosclerosis. No enlarged abdominal or pelvic lymph nodes. Reproductive: No mass or other abnormality. Other: No abdominal wall hernia or abnormality. No abdominopelvic ascites. Musculoskeletal: No acute or significant osseous findings. IMPRESSION: 1. Unchanged post radiation/post treatment appearance of the perihilar right lung. 2. Unchanged post treatment soft tissue thickening about the right hilum and lower trachea, without discretely enlarged mediastinal lymph nodes. 3. Moderate right, small left pleural effusions and associated atelectasis or consolidation, increased compared to prior examination. There is no direct evidence of pleural metastatic disease. 4. Unchanged 4 mm pulmonary nodule of the left lower lobe, nonspecific although likely benign and  incidental sequelae of infection or inflammation. Attention on follow-up. 5. No evidence of distant metastatic disease in the abdomen or pelvis. 6. Unchanged, moderate pericardial effusion. 7. Emphysema. Aortic Atherosclerosis (ICD10-I70.0) and Emphysema (ICD10-J43.9). Electronically Signed   By: Alex  Bibbey M.D.   On: 01/08/2021 08:39       I have independently reviewed the above radiology studies  and reviewed the findings with the patient.   Recent Lab Findings: Lab Results  Component Value Date   WBC 8.9 01/08/2021   HGB 9.7 (L) 01/08/2021   HCT 30.3 (L) 01/08/2021   PLT 521 (H) 01/08/2021   GLUCOSE 95 01/08/2021   ALT 9 01/08/2021   AST 22 01/08/2021   NA 141 01/08/2021   K 3.8 01/08/2021   CL 106 01/08/2021   CREATININE 1.14 (H) 01/08/2021   BUN 10 01/08/2021   CO2 25 01/08/2021   TSH 2.754 01/08/2021   INR 1.2 (H) 11/02/2017       Assessment / Plan:   65-year-old female with right-sided lung cancer that is stage IV who now has a right-sided pleural effusion and moderate-sized pericardial effusion on cross-sectional imaging.  She is quite symptomatic from this.  She has previously received radiation on the right side of this we discussed the possibility of undergoing a right-sided thoracentesis and a left-sided robotic assisted thoracoscopy with pericardial window.  She is agreeable to proceed with this and is tentatively scheduled for the next few weeks.     I  spent 20 minutes with  the patient face to face in counseling and coordination of care.    Harrell O Lightfoot 01/25/2021 6:14 PM        

## 2021-01-25 NOTE — Progress Notes (Signed)
    [address]       [place] [address]             [phone]                    Mahreen C Schuman [facility] Medical Record #[id number] Date of Birth: 03/24/1956  Referring: Revankar, Rajan R, MD Primary Care: Pcp, No Primary Cardiologist: None  Chief Complaint:    Chief Complaint  Patient presents with   Follow-up    Further discuss surgery    History of Present Illness:    Susan Holder 65 y.o. female with a history of metastatic non-small cell lung cancer on the right presents for follow-up in regards to moderate sized pericardial effusion, and a right-sided pleural effusion.  She continues to have some tachycardia as well as some exertional dyspnea.  She would like to proceed with surgical drainage of these effusions.      Zubrod Score: At the time of surgery this patient's most appropriate activity status/level should be described as: []    0    Normal activity, no symptoms [x]    1    Restricted in physical strenuous activity but ambulatory, able to do out light work []    2    Ambulatory and capable of self care, unable to do work activities, up and about               >50 % of waking hours                              []    3    Only limited self care, in bed greater than 50% of waking hours []    4    Completely disabled, no self care, confined to bed or chair []    5    Moribund   Past Medical History:  Diagnosis Date   Acid reflux 09/21/2018   Adenocarcinoma of right lung, stage 4 (HCC) 11/17/2017   Allergic rhinitis 08/22/2013   ANA positive 05/29/2016   Aortic atherosclerosis (HCC) 10/23/2020   Brain metastases (HCC) 03/29/2019   Coronary artery calcification seen on CT scan 10/23/2020   DDD (degenerative disc disease), cervical 05/29/2016   Encounter for antineoplastic chemotherapy 11/17/2017   Encounter for antineoplastic immunotherapy 02/01/2018   Goals of care, counseling/discussion 11/17/2017   High risk medication use 05/29/2016    04/29/2016: ==> plq 200 am & 100 qhs(adeq response).   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) 11/04/2018   Other fatigue 05/29/2016   Pericardial effusion 10/23/2020   Primary malignant neoplasm of bronchus of right lower lobe (HCC) 12/21/2017   Rheumatoid arthritis (HCC)    Rheumatoid arthritis involving multiple sites with positive rheumatoid factor (HCC) 05/29/2016   +RF +ANA +CCP    Smoker 05/29/2016   Trigger finger, left ring finger 05/29/2016   Trigger finger, right ring finger 05/29/2016   Vitamin D deficiency 05/29/2016    Past Surgical History:  Procedure Laterality Date   BRONCHIAL NEEDLE ASPIRATION BIOPSY  11/09/2017   Procedure: BRONCHIAL NEEDLE ASPIRATION BIOPSIES;  Surgeon: Mannam, Praveen, MD;  Location: [facility];  Service: Cardiopulmonary;;   ENDOBRONCHIAL ULTRASOUND Bilateral 11/09/2017   Procedure: ENDOBRONCHIAL ULTRASOUND;  Surgeon:   Mannam, Praveen, MD;  Location: [facility];  Service: Cardiopulmonary;  Laterality: Bilateral;   NASAL SINUS SURGERY     NASAL SINUS SURGERY     TUBAL LIGATION     VIDEO BRONCHOSCOPY  11/09/2017   Procedure: VIDEO BRONCHOSCOPY;  Surgeon: Mannam, Praveen, MD;  Location: [facility];  Service: Cardiopulmonary;;    Family History  Problem Relation Age of Onset   Stroke Mother    Alzheimer's disease Mother    Heart disease Mother    Emphysema Father    Hypertension Brother    Heart attack Maternal Aunt    Heart failure Maternal Grandmother    Hypertension Paternal Grandmother      Social History   Tobacco Use  Smoking Status Former   Packs/day: 0.50   Years: 45.00   Pack years: 22.50   Types: Cigarettes   Quit date: 09/29/2017   Years since quitting: 3.3  Smokeless Tobacco Never    Social History   Substance and Sexual Activity  Alcohol Use Not Currently     No Known Allergies  Current Outpatient  Medications  Medication Sig Dispense Refill   diclofenac sodium (VOLTAREN) 1 % GEL Apply 2 g topically 4 (four) times daily.     Fluocinolone Acetonide Scalp 0.01 % OIL Apply 1 application topically daily.     folic acid (FOLVITE) 1 MG tablet Take 1 mg by mouth daily.     hydroxychloroquine (PLAQUENIL) 200 MG tablet TAKE 1 TABLET BY MOUTH TWICE DAILY MONDAY THROUGH FRIDAY ONLY. 120 tablet 0   omeprazole (PRILOSEC) 20 MG capsule Take 20 mg by mouth daily.     prochlorperazine (COMPAZINE) 10 MG tablet TAKE 1 TABLET(10 MG) BY MOUTH EVERY 6 HOURS AS NEEDED FOR NAUSEA OR VOMITING 30 tablet 2   vitamin C (ASCORBIC ACID) 500 MG tablet Take 500 mg by mouth daily.     VITAMIN D PO Take 1 tablet by mouth daily.     No current facility-administered medications for this visit.      Review of Systems  Constitutional:  Positive for malaise/fatigue and weight loss.  Respiratory:  Positive for shortness of breath. Negative for cough.   Cardiovascular:  Negative for chest pain and palpitations.      PHYSICAL EXAMINATION: BP (!) 149/71 (BP Location: Left Arm, Patient Position: Sitting)   Pulse (!) 122   Resp 20   Ht 5' (1.524 m)   Wt 107 lb (48.5 kg)   LMP 04/28/2000   SpO2 98%   BMI 20.90 kg/m   Physical Exam Constitutional:      Appearance: She is normal weight. She is ill-appearing. She is not toxic-appearing.  HENT:     Head: Normocephalic.  Eyes:     Extraocular Movements: Extraocular movements intact.  Cardiovascular:     Rate and Rhythm: Tachycardia present.  Pulmonary:     Effort: Pulmonary effort is normal. No respiratory distress.  Abdominal:     General: Abdomen is flat. There is no distension.  Musculoskeletal:        General: Normal range of motion.     Cervical back: Normal range of motion.  Skin:    General: Skin is warm and dry.  Neurological:     General: No focal deficit present.     Mental Status: She is alert and oriented to person, place, and time.    Diagnostic Studies & Laboratory data:     Recent Radiology Findings:   CT Chest W Contrast  Result Date: 01/08/2021 CLINICAL   DATA:  Non-small cell lung cancer restaging, ongoing chemotherapy EXAM: CT CHEST, ABDOMEN, AND PELVIS WITH CONTRAST TECHNIQUE: Multidetector CT imaging of the chest, abdomen and pelvis was performed following the standard protocol during bolus administration of intravenous contrast. CONTRAST:  80mL OMNIPAQUE IOHEXOL 350 MG/ML SOLN, additional oral enteric contrast COMPARISON:  10/09/2020 FINDINGS: CT CHEST FINDINGS Cardiovascular: Aortic atherosclerosis. Normal heart size. Unchanged, moderate pericardial effusion. Mediastinum/Nodes: Unchanged soft tissue thickening about the right hilum and lower trachea, without discretely enlarged mediastinal lymph nodes. Thyroid gland, trachea, and esophagus demonstrate no significant findings. Lungs/Pleura: Moderate right, small left pleural effusions and associated atelectasis or consolidation, increased compared to prior examination. Moderate centrilobular emphysema. Unchanged post radiation/post treatment appearance of the perihilar right lung (series 6, image 57). Additional bland appearing, bandlike scarring of the bilateral lung bases. Unchanged 4 mm pulmonary nodule of the left lower lobe (series 6, image 75). Musculoskeletal: No chest wall mass or suspicious bone lesions identified. CT ABDOMEN PELVIS FINDINGS Hepatobiliary: No solid liver abnormality is seen. No gallstones, gallbladder wall thickening, or biliary dilatation. Pancreas: Unremarkable. No pancreatic ductal dilatation or surrounding inflammatory changes. Spleen: Normal in size without significant abnormality. Adrenals/Urinary Tract: Adrenal glands are unremarkable. Kidneys are normal, without renal calculi, solid lesion, or hydronephrosis. Bladder is unremarkable. Stomach/Bowel: Stomach is within normal limits. Appendix appears normal. No evidence of bowel wall thickening,  distention, or inflammatory changes. Vascular/Lymphatic: Aortic atherosclerosis. No enlarged abdominal or pelvic lymph nodes. Reproductive: No mass or other abnormality. Other: No abdominal wall hernia or abnormality. No abdominopelvic ascites. Musculoskeletal: No acute or significant osseous findings. IMPRESSION: 1. Unchanged post radiation/post treatment appearance of the perihilar right lung. 2. Unchanged post treatment soft tissue thickening about the right hilum and lower trachea, without discretely enlarged mediastinal lymph nodes. 3. Moderate right, small left pleural effusions and associated atelectasis or consolidation, increased compared to prior examination. There is no direct evidence of pleural metastatic disease. 4. Unchanged 4 mm pulmonary nodule of the left lower lobe, nonspecific although likely benign and incidental sequelae of infection or inflammation. Attention on follow-up. 5. No evidence of distant metastatic disease in the abdomen or pelvis. 6. Unchanged, moderate pericardial effusion. 7. Emphysema. Aortic Atherosclerosis (ICD10-I70.0) and Emphysema (ICD10-J43.9). Electronically Signed   By: Alex  Bibbey M.D.   On: 01/08/2021 08:39   CT Abdomen Pelvis W Contrast  Result Date: 01/08/2021 CLINICAL DATA:  Non-small cell lung cancer restaging, ongoing chemotherapy EXAM: CT CHEST, ABDOMEN, AND PELVIS WITH CONTRAST TECHNIQUE: Multidetector CT imaging of the chest, abdomen and pelvis was performed following the standard protocol during bolus administration of intravenous contrast. CONTRAST:  80mL OMNIPAQUE IOHEXOL 350 MG/ML SOLN, additional oral enteric contrast COMPARISON:  10/09/2020 FINDINGS: CT CHEST FINDINGS Cardiovascular: Aortic atherosclerosis. Normal heart size. Unchanged, moderate pericardial effusion. Mediastinum/Nodes: Unchanged soft tissue thickening about the right hilum and lower trachea, without discretely enlarged mediastinal lymph nodes. Thyroid gland, trachea, and esophagus  demonstrate no significant findings. Lungs/Pleura: Moderate right, small left pleural effusions and associated atelectasis or consolidation, increased compared to prior examination. Moderate centrilobular emphysema. Unchanged post radiation/post treatment appearance of the perihilar right lung (series 6, image 57). Additional bland appearing, bandlike scarring of the bilateral lung bases. Unchanged 4 mm pulmonary nodule of the left lower lobe (series 6, image 75). Musculoskeletal: No chest wall mass or suspicious bone lesions identified. CT ABDOMEN PELVIS FINDINGS Hepatobiliary: No solid liver abnormality is seen. No gallstones, gallbladder wall thickening, or biliary dilatation. Pancreas: Unremarkable. No pancreatic ductal dilatation or surrounding inflammatory changes. Spleen: Normal   in size without significant abnormality. Adrenals/Urinary Tract: Adrenal glands are unremarkable. Kidneys are normal, without renal calculi, solid lesion, or hydronephrosis. Bladder is unremarkable. Stomach/Bowel: Stomach is within normal limits. Appendix appears normal. No evidence of bowel wall thickening, distention, or inflammatory changes. Vascular/Lymphatic: Aortic atherosclerosis. No enlarged abdominal or pelvic lymph nodes. Reproductive: No mass or other abnormality. Other: No abdominal wall hernia or abnormality. No abdominopelvic ascites. Musculoskeletal: No acute or significant osseous findings. IMPRESSION: 1. Unchanged post radiation/post treatment appearance of the perihilar right lung. 2. Unchanged post treatment soft tissue thickening about the right hilum and lower trachea, without discretely enlarged mediastinal lymph nodes. 3. Moderate right, small left pleural effusions and associated atelectasis or consolidation, increased compared to prior examination. There is no direct evidence of pleural metastatic disease. 4. Unchanged 4 mm pulmonary nodule of the left lower lobe, nonspecific although likely benign and  incidental sequelae of infection or inflammation. Attention on follow-up. 5. No evidence of distant metastatic disease in the abdomen or pelvis. 6. Unchanged, moderate pericardial effusion. 7. Emphysema. Aortic Atherosclerosis (ICD10-I70.0) and Emphysema (ICD10-J43.9). Electronically Signed   By: Alex  Bibbey M.D.   On: 01/08/2021 08:39       I have independently reviewed the above radiology studies  and reviewed the findings with the patient.   Recent Lab Findings: Lab Results  Component Value Date   WBC 8.9 01/08/2021   HGB 9.7 (L) 01/08/2021   HCT 30.3 (L) 01/08/2021   PLT 521 (H) 01/08/2021   GLUCOSE 95 01/08/2021   ALT 9 01/08/2021   AST 22 01/08/2021   NA 141 01/08/2021   K 3.8 01/08/2021   CL 106 01/08/2021   CREATININE 1.14 (H) 01/08/2021   BUN 10 01/08/2021   CO2 25 01/08/2021   TSH 2.754 01/08/2021   INR 1.2 (H) 11/02/2017       Assessment / Plan:   65-year-old female with right-sided lung cancer that is stage IV who now has a right-sided pleural effusion and moderate-sized pericardial effusion on cross-sectional imaging.  She is quite symptomatic from this.  She has previously received radiation on the right side of this we discussed the possibility of undergoing a right-sided thoracentesis and a left-sided robotic assisted thoracoscopy with pericardial window.  She is agreeable to proceed with this and is tentatively scheduled for the next few weeks.     I  spent 20 minutes with  the patient face to face in counseling and coordination of care.    Mattison Golay O Brynnley Dayrit 01/25/2021 6:14 PM        

## 2021-01-30 ENCOUNTER — Inpatient Hospital Stay: Payer: Medicare PPO

## 2021-01-30 ENCOUNTER — Other Ambulatory Visit: Payer: Self-pay

## 2021-01-30 ENCOUNTER — Inpatient Hospital Stay: Payer: Medicare PPO | Attending: Internal Medicine | Admitting: Physician Assistant

## 2021-01-30 VITALS — HR 110

## 2021-01-30 VITALS — BP 147/72 | HR 118 | Temp 97.5°F | Resp 18 | Ht 60.0 in | Wt 105.2 lb

## 2021-01-30 DIAGNOSIS — R059 Cough, unspecified: Secondary | ICD-10-CM | POA: Diagnosis not present

## 2021-01-30 DIAGNOSIS — Z5111 Encounter for antineoplastic chemotherapy: Secondary | ICD-10-CM | POA: Diagnosis not present

## 2021-01-30 DIAGNOSIS — R000 Tachycardia, unspecified: Secondary | ICD-10-CM | POA: Diagnosis not present

## 2021-01-30 DIAGNOSIS — C3431 Malignant neoplasm of lower lobe, right bronchus or lung: Secondary | ICD-10-CM

## 2021-01-30 DIAGNOSIS — C3491 Malignant neoplasm of unspecified part of right bronchus or lung: Secondary | ICD-10-CM | POA: Diagnosis not present

## 2021-01-30 DIAGNOSIS — I3139 Other pericardial effusion (noninflammatory): Secondary | ICD-10-CM | POA: Insufficient documentation

## 2021-01-30 DIAGNOSIS — R110 Nausea: Secondary | ICD-10-CM | POA: Diagnosis not present

## 2021-01-30 DIAGNOSIS — Z79899 Other long term (current) drug therapy: Secondary | ICD-10-CM | POA: Diagnosis not present

## 2021-01-30 DIAGNOSIS — R5383 Other fatigue: Secondary | ICD-10-CM | POA: Insufficient documentation

## 2021-01-30 DIAGNOSIS — R634 Abnormal weight loss: Secondary | ICD-10-CM | POA: Diagnosis not present

## 2021-01-30 DIAGNOSIS — C7931 Secondary malignant neoplasm of brain: Secondary | ICD-10-CM | POA: Diagnosis not present

## 2021-01-30 DIAGNOSIS — I700 Atherosclerosis of aorta: Secondary | ICD-10-CM | POA: Insufficient documentation

## 2021-01-30 DIAGNOSIS — Z5112 Encounter for antineoplastic immunotherapy: Secondary | ICD-10-CM

## 2021-01-30 DIAGNOSIS — J439 Emphysema, unspecified: Secondary | ICD-10-CM | POA: Diagnosis not present

## 2021-01-30 LAB — CMP (CANCER CENTER ONLY)
ALT: 9 U/L (ref 0–44)
AST: 31 U/L (ref 15–41)
Albumin: 2.7 g/dL — ABNORMAL LOW (ref 3.5–5.0)
Alkaline Phosphatase: 79 U/L (ref 38–126)
Anion gap: 13 (ref 5–15)
BUN: 10 mg/dL (ref 8–23)
CO2: 24 mmol/L (ref 22–32)
Calcium: 10.5 mg/dL — ABNORMAL HIGH (ref 8.9–10.3)
Chloride: 106 mmol/L (ref 98–111)
Creatinine: 1.21 mg/dL — ABNORMAL HIGH (ref 0.44–1.00)
GFR, Estimated: 49 mL/min — ABNORMAL LOW (ref >60)
Glucose, Bld: 93 mg/dL (ref 70–99)
Potassium: 4.1 mmol/L (ref 3.5–5.1)
Sodium: 143 mmol/L (ref 135–145)
Total Bilirubin: <0.2 mg/dL — ABNORMAL LOW (ref 0.3–1.2)
Total Protein: 7.7 g/dL (ref 6.5–8.1)

## 2021-01-30 LAB — CBC WITH DIFFERENTIAL (CANCER CENTER ONLY)
Abs Immature Granulocytes: 0.05 10*3/uL (ref 0.00–0.07)
Basophils Absolute: 0 10*3/uL (ref 0.0–0.1)
Basophils Relative: 0 %
Eosinophils Absolute: 0 10*3/uL (ref 0.0–0.5)
Eosinophils Relative: 0 %
HCT: 28.5 % — ABNORMAL LOW (ref 36.0–46.0)
Hemoglobin: 8.9 g/dL — ABNORMAL LOW (ref 12.0–15.0)
Immature Granulocytes: 1 %
Lymphocytes Relative: 9 %
Lymphs Abs: 1 10*3/uL (ref 0.7–4.0)
MCH: 33.7 pg (ref 26.0–34.0)
MCHC: 31.2 g/dL (ref 30.0–36.0)
MCV: 108 fL — ABNORMAL HIGH (ref 80.0–100.0)
Monocytes Absolute: 0.7 10*3/uL (ref 0.1–1.0)
Monocytes Relative: 7 %
Neutro Abs: 8.5 10*3/uL — ABNORMAL HIGH (ref 1.7–7.7)
Neutrophils Relative %: 83 %
Platelet Count: 572 10*3/uL — ABNORMAL HIGH (ref 150–400)
RBC: 2.64 MIL/uL — ABNORMAL LOW (ref 3.87–5.11)
RDW: 16.4 % — ABNORMAL HIGH (ref 11.5–15.5)
WBC Count: 10.2 10*3/uL (ref 4.0–10.5)
nRBC: 0 % (ref 0.0–0.2)

## 2021-01-30 LAB — TSH: TSH: 1.794 u[IU]/mL (ref 0.308–3.960)

## 2021-01-30 MED ORDER — ALTEPLASE 2 MG IJ SOLR
2.0000 mg | Freq: Once | INTRAMUSCULAR | Status: DC | PRN
Start: 2021-01-30 — End: 2021-01-30

## 2021-01-30 MED ORDER — CYANOCOBALAMIN 1000 MCG/ML IJ SOLN
1000.0000 ug | Freq: Once | INTRAMUSCULAR | Status: AC
  Administered 2021-01-30: 1000 ug via INTRAMUSCULAR
  Filled 2021-01-30: qty 1

## 2021-01-30 MED ORDER — PROCHLORPERAZINE MALEATE 10 MG PO TABS
10.0000 mg | ORAL_TABLET | Freq: Once | ORAL | Status: AC
  Administered 2021-01-30: 10 mg via ORAL
  Filled 2021-01-30: qty 1

## 2021-01-30 MED ORDER — SODIUM CHLORIDE 0.9% FLUSH: 3.0000 mL | INTRAVENOUS | Status: DC | PRN

## 2021-01-30 MED ORDER — SODIUM CHLORIDE 0.9 % IV SOLN
600.0000 mg | Freq: Once | INTRAVENOUS | Status: AC
  Administered 2021-01-30: 600 mg via INTRAVENOUS
  Filled 2021-01-30: qty 4

## 2021-01-30 MED ORDER — SODIUM CHLORIDE 0.9 % IV SOLN: Freq: Once | INTRAVENOUS | Status: AC

## 2021-01-30 MED ORDER — HEPARIN SOD (PORK) LOCK FLUSH 100 UNIT/ML IV SOLN: 250.0000 [IU] | Freq: Once | INTRAVENOUS | Status: DC | PRN

## 2021-01-30 MED ORDER — SODIUM CHLORIDE 0.9% FLUSH
10.0000 mL | INTRAVENOUS | Status: DC | PRN
Start: 2021-01-30 — End: 2021-01-30

## 2021-01-30 MED ORDER — SODIUM CHLORIDE 0.9 % IV SOLN
200.0000 mg | Freq: Once | INTRAVENOUS | Status: AC
  Administered 2021-01-30: 200 mg via INTRAVENOUS
  Filled 2021-01-30: qty 8

## 2021-01-30 MED ORDER — HEPARIN SOD (PORK) LOCK FLUSH 100 UNIT/ML IV SOLN: 500.0000 [IU] | Freq: Once | INTRAVENOUS | Status: DC | PRN

## 2021-01-30 NOTE — Progress Notes (Signed)
OK to trt with elevated HR per Cassie, PA.

## 2021-01-30 NOTE — Patient Instructions (Signed)
[facility] MEDICAL [facility]COL[facility]  Discharge Instructions: Thank you for choosing [facility] to provide your oncology and hematology care.   If you have a lab appointment with the Cancer Center, please go directly to the Cancer Center and check in at the registration area.   Wear comfortable clothing and clothing appropriate for easy access to any Portacath or PICC line.   We strive to give you quality time with your provider. You may need to reschedule your appointment if you arrive late (15 or more minutes).  Arriving late affects you and other patients whose appointments are after yours.  Also, if you miss three or more appointments without notifying the office, you may be dismissed from the clinic at the provider's discretion.      For prescription refill requests, have your pharmacy contact our office and allow 72 hours for refills to be completed.    Today you received the following chemotherapy and/or immunotherapy agents: Keytruda/Alimta.      To help prevent nausea and vomiting after your treatment, we encourage you to take your nausea medication as directed.  BELOW ARE SYMPTOMS THAT SHOULD BE REPORTED IMMEDIATELY: *FEVER GREATER THAN 100.4 F (38 C) OR HIGHER *CHILLS OR SWEATING *NAUSEA AND VOMITING THAT IS NOT CONTROLLED WITH YOUR NAUSEA MEDICATION *UNUSUAL SHORTNESS OF BREATH *UNUSUAL BRUISING OR BLEEDING *URINARY PROBLEMS (pain or burning when urinating, or frequent urination) *BOWEL PROBLEMS (unusual diarrhea, constipation, pain near the anus) TENDERNESS IN MOUTH AND THROAT WITH OR WITHOUT PRESENCE OF ULCERS (sore throat, sores in mouth, or a toothache) UNUSUAL RASH, SWELLING OR PAIN  UNUSUAL VAGINAL DISCHARGE OR ITCHING   Items with * indicate a potential emergency and should be followed up as soon as possible or go to the Emergency Department if any problems should occur.  Please show the CHEMOTHERAPY ALERT CARD or IMMUNOTHERAPY ALERT CARD at  check-in to the Emergency Department and triage nurse.  Should you have questions after your visit or need to cancel or reschedule your appointment, please contact [facility]  Dept: [phone]  and follow the prompts.  Office hours are 8:00 a.m. to 4:30 p.m. Monday - Friday. Please note that voicemails left after 4:00 p.m. may not be returned until the following business day.  We are closed weekends and major holidays. You have access to a nurse at all times for urgent questions. Please call the main number to the clinic Dept: [phone] and follow the prompts.   For any non-urgent questions, you may also contact your provider using MyChart. We now offer e-Visits for anyone 18 and older to request care online for non-urgent symptoms. For details visit [email or web address].   Also download the MyChart app! Go to the app store, search "MyChart", open the app, select [facility], and log in with your MyChart username and password.  Due to Covid, a mask is required upon entering the hospital/clinic. If you do not have a mask, one will be given to you upon arrival. For doctor visits, patients may have 1 support person aged 18 or older with them. For treatment visits, patients cannot have anyone with them due to current Covid guidelines and our immunocompromised population.   

## 2021-02-04 NOTE — Progress Notes (Signed)
Surgical Instructions    Your procedure is scheduled on Thursday October 13th.  Report to [facility] Main Entr[facility] "A" at 10:30 A.M., then check in with the Admitting office.  Call this number if you have problems the morning of surgery:  [phone]   If you have any questions prior to your surgery date call [phone]: Open Monday-Friday 8am-4pm    Remember:  Do not eat or drink anything after midnight the night before your surgery    Take these medicines the morning of surgery with A SIP OF WATER hydroxychloroquine (PLAQUENIL) 200 MG tablet  IF NEEDED  prochlorperazine (COMPAZINE) 10 MG tablet  As of today, STOP taking any Aspirin (unless otherwise instructed by your surgeon) Aleve, Naproxen, Ibuprofen, Motrin, Advil, Goody's, BC's, all herbal medications, fish oil, and all vitamins.     After your COVID test   You are not required to quarantine however you are required to wear a well-fitting mask when you are out and around people not in your household.  If your mask becomes wet or soiled, replace with a new one.  Wash your hands often with soap and water for 20 seconds or clean your hands with an alcohol-based hand sanitizer that contains at least 60% alcohol.  Do not share personal items.  Notify your provider: if you are in close contact with someone who has COVID  or if you develop a fever of 100.4 or greater, sneezing, cough, sore throat, shortness of breath or body aches.             Do not wear jewelry or makeup Do not wear lotions, powders, perfumes, or deodorant. Do not shave 48 hours prior to surgery.   Do not bring valuables to the hospital. DO Not wear nail polish, gel polish, artificial nails, or any other type of covering on natural nails including finger and toenails. If patients have artificial nails, gel coating, etc. that need to be removed by a nail salon, please have this removed prior to surgery or surgery may need to be canceled/delayed if  the surgeon/ anesthesia feels like the patient is unable to be adequately monitored.             [facility] is not responsible for any belongings or valuables.  Do NOT Smoke (Tobacco/Vaping)  24 hours prior to your procedure  If you use a CPAP at night, you may bring your mask for your overnight stay.   Contacts, glasses, hearing aids, dentures or partials may not be worn into surgery, please bring cases for these belongings   For patients admitted to the hospital, discharge time will be determined by your treatment team.   Patients discharged the day of surgery will not be allowed to drive home, and someone needs to stay with them for 24 hours.  NO VISITORS WILL BE ALLOWED IN PRE-OP WHERE PATIENTS ARE PREPPED FOR SURGERY.  ONLY 1 SUPPORT PERSON MAY BE PRESENT IN THE WAITING ROOM WHILE YOU ARE IN SURGERY.  IF YOU ARE TO BE ADMITTED, ONCE YOU ARE IN YOUR ROOM YOU WILL BE ALLOWED TWO (2) VISITORS. 1 (ONE) VISITOR MAY STAY OVERNIGHT BUT MUST ARRIVE TO THE ROOM BY 8pm.  Minor children may have two parents present. Special consideration for safety and communication needs will be reviewed on a case by case basis.  Special instructions:    Oral Hygiene is also important to reduce your risk of infection.  Remember - BRUSH YOUR TEETH THE MORNING OF SURGERY WITH YOUR REGULAR  TOOTHPASTE   [facility]- Preparing For Surgery  Before surgery, you can play an important role. Because skin is not sterile, your skin needs to be as free of germs as possible. You can reduce the number of germs on your skin by washing with CHG (chlorahexidine gluconate) Soap before surgery.  CHG is an antiseptic cleaner which kills germs and bonds with the skin to continue killing germs even after washing.     Please do not use if you have an allergy to CHG or antibacterial soaps. If your skin becomes reddened/irritated stop using the CHG.  Do not shave (including legs and underarms) for at least 48 hours prior to first CHG  shower. It is OK to shave your face.  Please follow these instructions carefully.     Shower the NIGHT BEFORE SURGERY and the MORNING OF SURGERY with CHG Soap.   If you chose to wash your hair, wash your hair first as usual with your normal shampoo. After you shampoo, rinse your hair and body thoroughly to remove the shampoo.  Then [employer] and genitals (private parts) with your normal soap and rinse thoroughly to remove soap.  After that Use CHG Soap as you would any other liquid soap. You can apply CHG directly to the skin and wash gently with a scrungie or a clean washcloth.   Apply the CHG Soap to your body ONLY FROM THE NECK DOWN.  Do not use on open wounds or open sores. Avoid contact with your eyes, ears, mouth and genitals (private parts). Wash Face and genitals (private parts)  with your normal soap.   Wash thoroughly, paying special attention to the area where your surgery will be performed.  Thoroughly rinse your body with warm water from the neck down.  DO NOT shower/wash with your normal soap after using and rinsing off the CHG Soap.  Pat yourself dry with a CLEAN TOWEL.  Wear CLEAN PAJAMAS to bed the night before surgery  Place CLEAN SHEETS on your bed the night before your surgery  DO NOT SLEEP WITH PETS.   Day of Surgery:  Take a shower with CHG soap. Wear Clean/Comfortable clothing the morning of surgery Do not apply any deodorants/lotions.   Remember to brush your teeth WITH YOUR REGULAR TOOTHPASTE.   Please read over the following fact sheets that you were given.

## 2021-02-05 ENCOUNTER — Encounter (HOSPITAL_COMMUNITY)
Admission: RE | Admit: 2021-02-05 | Discharge: 2021-02-05 | Disposition: A | Discharge status: Home / Self Care | Payer: Medicare PPO | Source: Ambulatory Visit | Attending: Thoracic Surgery (Cardiothoracic Vascular Surgery) | Admitting: Thoracic Surgery (Cardiothoracic Vascular Surgery)

## 2021-02-05 ENCOUNTER — Other Ambulatory Visit: Payer: Self-pay

## 2021-02-05 ENCOUNTER — Encounter (HOSPITAL_COMMUNITY): Payer: Self-pay

## 2021-02-05 ENCOUNTER — Ambulatory Visit (HOSPITAL_COMMUNITY)
Admission: RE | Admit: 2021-02-05 | Discharge: 2021-02-05 | Disposition: A | Discharge status: Home / Self Care | Payer: Medicare PPO | Source: Ambulatory Visit | Attending: Thoracic Surgery (Cardiothoracic Vascular Surgery) | Admitting: Thoracic Surgery (Cardiothoracic Vascular Surgery)

## 2021-02-05 DIAGNOSIS — Z825 Family history of asthma and other chronic lower respiratory diseases: Secondary | ICD-10-CM | POA: Diagnosis not present

## 2021-02-05 DIAGNOSIS — Z820 Family history of epilepsy and other diseases of the nervous system: Secondary | ICD-10-CM | POA: Diagnosis not present

## 2021-02-05 DIAGNOSIS — C770 Secondary and unspecified malignant neoplasm of lymph nodes of head, face and neck: Secondary | ICD-10-CM | POA: Diagnosis not present

## 2021-02-05 DIAGNOSIS — C3490 Malignant neoplasm of unspecified part of unspecified bronchus or lung: Secondary | ICD-10-CM | POA: Diagnosis not present

## 2021-02-05 DIAGNOSIS — I700 Atherosclerosis of aorta: Secondary | ICD-10-CM | POA: Diagnosis not present

## 2021-02-05 DIAGNOSIS — C3431 Malignant neoplasm of lower lobe, right bronchus or lung: Secondary | ICD-10-CM | POA: Diagnosis not present

## 2021-02-05 DIAGNOSIS — C7931 Secondary malignant neoplasm of brain: Secondary | ICD-10-CM | POA: Diagnosis not present

## 2021-02-05 DIAGNOSIS — J439 Emphysema, unspecified: Secondary | ICD-10-CM | POA: Diagnosis not present

## 2021-02-05 DIAGNOSIS — R197 Diarrhea, unspecified: Secondary | ICD-10-CM | POA: Diagnosis not present

## 2021-02-05 DIAGNOSIS — K219 Gastro-esophageal reflux disease without esophagitis: Secondary | ICD-10-CM | POA: Diagnosis present

## 2021-02-05 DIAGNOSIS — I3139 Other pericardial effusion (noninflammatory): Secondary | ICD-10-CM | POA: Diagnosis not present

## 2021-02-05 DIAGNOSIS — Z20822 Contact with and (suspected) exposure to covid-19: Secondary | ICD-10-CM | POA: Diagnosis not present

## 2021-02-05 DIAGNOSIS — D62 Acute posthemorrhagic anemia: Secondary | ICD-10-CM | POA: Diagnosis not present

## 2021-02-05 DIAGNOSIS — Z9221 Personal history of antineoplastic chemotherapy: Secondary | ICD-10-CM | POA: Diagnosis not present

## 2021-02-05 DIAGNOSIS — E559 Vitamin D deficiency, unspecified: Secondary | ICD-10-CM | POA: Diagnosis present

## 2021-02-05 DIAGNOSIS — J95811 Postprocedural pneumothorax: Secondary | ICD-10-CM | POA: Diagnosis not present

## 2021-02-05 DIAGNOSIS — R000 Tachycardia, unspecified: Secondary | ICD-10-CM | POA: Diagnosis not present

## 2021-02-05 DIAGNOSIS — I3131 Malignant pericardial effusion in diseases classified elsewhere: Secondary | ICD-10-CM | POA: Diagnosis not present

## 2021-02-05 DIAGNOSIS — Z01818 Encounter for other preprocedural examination: Secondary | ICD-10-CM | POA: Diagnosis not present

## 2021-02-05 DIAGNOSIS — J9811 Atelectasis: Secondary | ICD-10-CM | POA: Diagnosis not present

## 2021-02-05 DIAGNOSIS — Z79899 Other long term (current) drug therapy: Secondary | ICD-10-CM | POA: Insufficient documentation

## 2021-02-05 DIAGNOSIS — R0602 Shortness of breath: Secondary | ICD-10-CM | POA: Diagnosis not present

## 2021-02-05 DIAGNOSIS — Z823 Family history of stroke: Secondary | ICD-10-CM | POA: Diagnosis not present

## 2021-02-05 DIAGNOSIS — J90 Pleural effusion, not elsewhere classified: Secondary | ICD-10-CM | POA: Diagnosis not present

## 2021-02-05 DIAGNOSIS — Z8249 Family history of ischemic heart disease and other diseases of the circulatory system: Secondary | ICD-10-CM | POA: Diagnosis not present

## 2021-02-05 DIAGNOSIS — Z87891 Personal history of nicotine dependence: Secondary | ICD-10-CM | POA: Diagnosis not present

## 2021-02-05 DIAGNOSIS — M0579 Rheumatoid arthritis with rheumatoid factor of multiple sites without organ or systems involvement: Secondary | ICD-10-CM | POA: Diagnosis not present

## 2021-02-05 DIAGNOSIS — I2510 Atherosclerotic heart disease of native coronary artery without angina pectoris: Secondary | ICD-10-CM | POA: Diagnosis present

## 2021-02-05 DIAGNOSIS — J910 Malignant pleural effusion: Secondary | ICD-10-CM | POA: Diagnosis not present

## 2021-02-05 LAB — CBC
HCT: 27.7 % — ABNORMAL LOW (ref 36.0–46.0)
Hemoglobin: 8.5 g/dL — ABNORMAL LOW (ref 12.0–15.0)
MCH: 33.3 pg (ref 26.0–34.0)
MCHC: 30.7 g/dL (ref 30.0–36.0)
MCV: 108.6 fL — ABNORMAL HIGH (ref 80.0–100.0)
Platelets: 349 10*3/uL (ref 150–400)
RBC: 2.55 MIL/uL — ABNORMAL LOW (ref 3.87–5.11)
RDW: 15.4 % (ref 11.5–15.5)
WBC: 10.6 10*3/uL — ABNORMAL HIGH (ref 4.0–10.5)
nRBC: 0 % (ref 0.0–0.2)

## 2021-02-05 LAB — BLOOD GAS, ARTERIAL
Acid-Base Excess: 1 mmol/L (ref 0.0–2.0)
Bicarbonate: 24.6 mmol/L (ref 20.0–28.0)
Drawn by: 602861
FIO2: 21
O2 Saturation: 96.4 %
Patient temperature: 37
pCO2 arterial: 36.1 mmHg (ref 32.0–48.0)
pH, Arterial: 7.447 (ref 7.350–7.450)
pO2, Arterial: 85.4 mmHg (ref 83.0–108.0)

## 2021-02-05 LAB — COMPREHENSIVE METABOLIC PANEL
ALT: 18 U/L (ref 0–44)
AST: 38 U/L (ref 15–41)
Albumin: 2.6 g/dL — ABNORMAL LOW (ref 3.5–5.0)
Alkaline Phosphatase: 73 U/L (ref 38–126)
Anion gap: 12 (ref 5–15)
BUN: 11 mg/dL (ref 8–23)
CO2: 22 mmol/L (ref 22–32)
Calcium: 9.5 mg/dL (ref 8.9–10.3)
Chloride: 99 mmol/L (ref 98–111)
Creatinine, Ser: 1.18 mg/dL — ABNORMAL HIGH (ref 0.44–1.00)
GFR, Estimated: 51 mL/min — ABNORMAL LOW (ref >60)
Glucose, Bld: 139 mg/dL — ABNORMAL HIGH (ref 70–99)
Potassium: 3.8 mmol/L (ref 3.5–5.1)
Sodium: 133 mmol/L — ABNORMAL LOW (ref 135–145)
Total Bilirubin: 0.9 mg/dL (ref 0.3–1.2)
Total Protein: 7.2 g/dL (ref 6.5–8.1)

## 2021-02-05 LAB — URINALYSIS, ROUTINE W REFLEX MICROSCOPIC
Bacteria, UA: NONE SEEN
Bilirubin Urine: NEGATIVE
Glucose, UA: NEGATIVE mg/dL
Hgb urine dipstick: NEGATIVE
Ketones, ur: NEGATIVE mg/dL
Leukocytes,Ua: NEGATIVE
Nitrite: NEGATIVE
Protein, ur: 30 mg/dL — AB
Specific Gravity, Urine: 1.008 (ref 1.005–1.030)
pH: 7 (ref 5.0–8.0)

## 2021-02-05 LAB — PROTIME-INR
INR: 1 (ref 0.8–1.2)
Prothrombin Time: 13.7 seconds (ref 11.4–15.2)

## 2021-02-05 LAB — SARS CORONAVIRUS 2 (TAT 6-24 HRS): SARS Coronavirus 2: NEGATIVE

## 2021-02-05 LAB — SURGICAL PCR SCREEN
MRSA, PCR: NEGATIVE
Staphylococcus aureus: NEGATIVE

## 2021-02-05 LAB — APTT: aPTT: 30 seconds (ref 24–36)

## 2021-02-05 NOTE — Progress Notes (Signed)
PCP - Dr. Letta Median  Cardiologist - Dr. Julianne Rice and Dr. Kipp Brood  Chest x-ray - 02/05/21 EKG - 02/05/21 Stress Test - Denies ECHO - 11/09/20 Cardiac Cath - Denies  Sleep Study - Denies  Denies DM  COVID TEST- 02/05/21   Anesthesia review: Yes cardiac history  Patient denies shortness of breath, fever, cough and chest pain at PAT appointment   All instructions explained to the patient, with a verbal understanding of the material. Patient agrees to go over the instructions while at home for a better understanding. Patient also instructed to wear a mask while in public after being tested for COVID-19. The opportunity to ask questions was provided.

## 2021-02-06 NOTE — Progress Notes (Signed)
Anesthesia Chart Review:   Case: [id number] Date/Time: 02/07/21 1215   Procedures:      XI ROBOTIC ASSISTED THORACOSCOPY PERICARDIAL WINDOW (Left)     THORACENTESIS (Right)   Anesthesia type: General   Pre-op diagnosis: PERICARDIAL EFFUSIONS   Location: [facility] OR ROOM 10 / [place] [facility]   Surgeons: Lajuana Matte, MD       DISCUSSION: Pt is 65 years old with hx that includes:  - tachycardia (unclear etiology - Dr. Geraldo Pitter notes he does not want to use beta blocker and cause hypotension; HR 85 at pre-admission testing) - pericardial effusion - coronary artery calcifications (medical management recommended at this time due to comorbidities) - metastatic lung cancer (currently undergoing chemotherapy)   Hgb 8.5. I reviewed labs with Dr. Daiva Huge.     VS: BP 133/65   Pulse 85   Temp 37.3 C (Oral)   Resp 18   Ht 5' (1.524 m)   Wt 46.7 kg   LMP 04/28/2000   SpO2 100%   BMI 20.10 kg/m   PROVIDERS: - PCP is Cirigliano, Garvin Fila, DO - Cardiologist is Angelita Ingles, MD.   - Oncologist is Curt Bears, MD. Last office visit 01/30/21 with Cassandra Heilingoetter, PA notes "She will have her procedure performed next week as scheduled. I reviewed this with Dr. Julien Nordmann who stated she is ok to proceed with her procedure as planned on 02/07/21"   LABS:  - Hgb 8.5. Pt is currently undergoing systemic chemotherapy every 3 weeks. Baseline hgb ~9-10. Hgb was 8.9 on 01/30/21, date of most recent chemo treatment.   (all labs ordered are listed, but only abnormal results are displayed)  Labs Reviewed  CBC - Abnormal; Notable for the following components:      Result Value   WBC 10.6 (*)    RBC 2.55 (*)    Hemoglobin 8.5 (*)    HCT 27.7 (*)    MCV 108.6 (*)    All other components within normal limits  COMPREHENSIVE METABOLIC PANEL - Abnormal; Notable for the following components:   Sodium 133 (*)    Glucose, Bld 139 (*)    Creatinine, Ser 1.18 (*)    Albumin 2.6 (*)    GFR, Estimated 51  (*)    All other components within normal limits  BLOOD GAS, ARTERIAL - Abnormal; Notable for the following components:   Allens test (pass/fail) BRACHIAL ARTERY (*)    All other components within normal limits  URINALYSIS, ROUTINE W REFLEX MICROSCOPIC - Abnormal; Notable for the following components:   Protein, ur 30 (*)    All other components within normal limits  SARS CORONAVIRUS 2 (TAT 6-24 HRS)  SURGICAL PCR SCREEN  PROTIME-INR  APTT  TYPE AND SCREEN     IMAGES: CXR 02/05/21:  - Small left and moderate right pleural effusions with soft tissue fullness in the right hilum and right parahilar atelectasis or scarring.  CT chest, abd, pelvis 01/07/21:  1. Unchanged post radiation/post treatment appearance of the perihilar right lung. 2. Unchanged post treatment soft tissue thickening about the right hilum and lower trachea, without discretely enlarged mediastinal lymph nodes. 3. Moderate right, small left pleural effusions and associated atelectasis or consolidation, increased compared to prior examination. There is no direct evidence of pleural metastatic disease. 4. Unchanged 4 mm pulmonary nodule of the left lower lobe, nonspecific although likely benign and incidental sequelae of infection or inflammation. Attention on follow-up. 5. No evidence of distant metastatic disease in the abdomen or pelvis.  6. Unchanged, moderate pericardial effusion. 7. Emphysema.   EKG 02/05/21: Sinus tachycardia - 126 bpm. Nonspecific T wave abnormality. Low voltage QRS   CV: Echo 11/09/20:  1. Left ventricular ejection fraction, by estimation, is 40 to 45%. The left ventricle has mildly decreased function. The left ventricle demonstrates global hypokinesis. The left ventricular internal cavity size was mildly dilated. Left ventricular diastolic parameters are consistent with Grade I diastolic dysfunction (impaired relaxation).  2. Right ventricular systolic function is moderately reduced. The right  ventricular size is normal. There is normal pulmonary artery systolic pressure.  3. There is a moderate circumferential pericardial effusion. There is no collapse of the RV or RA. The MV inflow pattern is normal. There is no evidence of pericardial tamponade but I would recommend close clinical follow up for further evaluation  Moderate pericardial effusion. The pericardial effusion is circumferential. There is no evidence of cardiac tamponade.  4. The mitral valve is grossly normal. No evidence of mitral valve regurgitation.  5. The aortic valve is grossly normal. Aortic valve regurgitation is moderate.  6. The IVC is normal size. There is inspiratory collapse of the IVC. The inferior vena cava is normal in size with greater than 50% respiratory variability, suggesting right atrial pressure of 3 mmHg.    Past Medical History:  Diagnosis Date   Acid reflux 09/21/2018   Adenocarcinoma of right lung, stage 4 ([place]) 11/17/2017   Allergic rhinitis 08/22/2013   ANA positive 05/29/2016   Aortic atherosclerosis ([place]) 10/23/2020   Brain metastases ([place]) 03/29/2019   Coronary artery calcification seen on CT scan 10/23/2020   DDD (degenerative disc disease), cervical 05/29/2016   Encounter for antineoplastic chemotherapy 11/17/2017   Encounter for antineoplastic immunotherapy 02/01/2018   Goals of care, counseling/discussion 11/17/2017   High risk medication use 05/29/2016   04/29/2016: ==> plq 200 am & 100 qhs(adeq response).   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) 11/04/2018   Other fatigue 05/29/2016   Pericardial effusion 10/23/2020   Primary malignant neoplasm of bronchus of right lower lobe ([place]) 12/21/2017   Rheumatoid arthritis (HCC)    Rheumatoid arthritis involving multiple sites with positive rheumatoid factor ([place]) 05/29/2016   +RF +ANA +CCP    Smoker 05/29/2016   Trigger finger,  left ring finger 05/29/2016   Trigger finger, right ring finger 05/29/2016   Vitamin D deficiency 05/29/2016    Past Surgical History:  Procedure Laterality Date   BRONCHIAL NEEDLE ASPIRATION BIOPSY  11/09/2017   Procedure: BRONCHIAL NEEDLE ASPIRATION BIOPSIES;  Surgeon: Marshell Garfinkel, MD;  Location: [facility];  Service: Cardiopulmonary;;   ENDOBRONCHIAL ULTRASOUND Bilateral 11/09/2017   Procedure: ENDOBRONCHIAL ULTRASOUND;  Surgeon: Marshell Garfinkel, MD;  Location: [facility];  Service: Cardiopulmonary;  Laterality: Bilateral;   NASAL SINUS SURGERY     NASAL SINUS SURGERY     TUBAL LIGATION     VIDEO BRONCHOSCOPY  11/09/2017   Procedure: VIDEO BRONCHOSCOPY;  Surgeon: Marshell Garfinkel, MD;  Location: [facility];  Service: Cardiopulmonary;;    MEDICATIONS:  folic acid (FOLVITE) 1 MG tablet   hydroxychloroquine (PLAQUENIL) 200 MG tablet   omeprazole (PRILOSEC) 20 MG capsule   prochlorperazine (COMPAZINE) 10 MG tablet   vitamin C (ASCORBIC ACID) 500 MG tablet   VITAMIN D PO   No current facility-administered medications for this encounter.    If no  changes, I anticipate pt can proceed with surgery as scheduled.   Willeen Cass, PhD, FNP-BC [facility]/Anesthesiology Phone: [phone] 02/06/2021 10:45 AM

## 2021-02-06 NOTE — Anesthesia Preprocedure Evaluation (Addendum)
Anesthesia Evaluation  Patient identified by MRN, date of birth, ID band Patient awake    Reviewed: Allergy & Precautions, NPO status , Patient's Chart, lab work & pertinent test results  Airway Mallampati: II  TM Distance: >3 FB Neck ROM: Full    Dental  (+) Teeth Intact   Pulmonary former smoker,  Right lung cancer with diffuse mets    + decreased breath sounds      Cardiovascular + CAD   Rhythm:Regular Rate:Normal  Pericardial effusions   Neuro/Psych negative neurological ROS  negative psych ROS   GI/Hepatic Neg liver ROS, GERD  Medicated,  Endo/Other  negative endocrine ROS  Renal/GU negative Renal ROS  negative genitourinary   Musculoskeletal  (+) Arthritis , Rheumatoid disorders,    Abdominal (+)  Abdomen: soft.    Peds  Hematology negative hematology ROS (+)   Anesthesia Other Findings   Reproductive/Obstetrics                           Anesthesia Physical Anesthesia Plan  ASA: 3  Anesthesia Plan: General   Post-op Pain Management:    Induction: Intravenous  PONV Risk Score and Plan: 3 and Ondansetron, Dexamethasone, Midazolam and Treatment may vary due to age or medical condition  Airway Management Planned: Mask and Double Lumen EBT  Additional Equipment: Arterial line  Intra-op Plan:   Post-operative Plan: Extubation in OR  Informed Consent: I have reviewed the patients History and Physical, chart, labs and discussed the procedure including the risks, benefits and alternatives for the proposed anesthesia with the patient or authorized representative who has indicated his/her understanding and acceptance.     Dental advisory given  Plan Discussed with: CRNA  Anesthesia Plan Comments: (See APP note by Durel Salts, FNP  Lab Results      Component                Value               Date                      WBC                      10.6 (H)            02/05/2021                 HGB                      8.5 (L)             02/05/2021                HCT                      27.7 (L)            02/05/2021                MCV                      108.6 (H)           02/05/2021                PLT                      349  02/05/2021           Lab Results      Component                Value               Date                      NA                       133 (L)             02/05/2021                K                        3.8                 02/05/2021                CO2                      22                  02/05/2021                GLUCOSE                  139 (H)             02/05/2021                BUN                      11                  02/05/2021                CREATININE               1.18 (H)            02/05/2021                CALCIUM                  9.5                 02/05/2021                GFRNONAA                 51 (L)              02/05/2021           ECHO 07/22: IMPRESSIONS  1. Left ventricular ejection fraction, by estimation, is 40 to 45%. The  left ventricle has mildly decreased function. The left ventricle  demonstrates global hypokinesis. The left ventricular internal cavity size  was mildly dilated. Left ventricular  diastolic parameters are consistent with Grade I diastolic dysfunction  (impaired relaxation).  2. Right ventricular systolic function is moderately reduced. The right  ventricular size is normal. There is normal pulmonary artery systolic  pressure.  3. There is a moderate circumferential pericardial effusion. There is no  collapse of the RV or RA. The MV inflow pattern is normal. There is no  evidence of pericardial tamponade but I would recommend close clinical  follow up  for further evaluation . Marland Kitchen  Moderate pericardial effusion. The pericardial effusion is  circumferential. There is no evidence of cardiac tamponade.  4. The mitral valve is grossly normal. No evidence of mitral  valve  regurgitation.  5. The aortic valve is grossly normal. Aortic valve regurgitation is  moderate.  6. The IVC is normal size. There is inspiratory collapse of the IVC. Marland Kitchen  The inferior vena cava is normal in size with greater than 50% respiratory  variability, suggesting right atrial pressure of 3 mmHg.)      Anesthesia Quick Evaluation

## 2021-02-07 ENCOUNTER — Other Ambulatory Visit: Payer: Self-pay

## 2021-02-07 ENCOUNTER — Inpatient Hospital Stay (HOSPITAL_COMMUNITY): Payer: Medicare PPO

## 2021-02-07 ENCOUNTER — Inpatient Hospital Stay (HOSPITAL_COMMUNITY)
Admission: RE | Admit: 2021-02-07 | Discharge: 2021-02-10 | DRG: 271 | Disposition: A | Discharge status: Home / Self Care | Payer: Medicare PPO | Attending: Thoracic Surgery (Cardiothoracic Vascular Surgery) | Admitting: Thoracic Surgery (Cardiothoracic Vascular Surgery)

## 2021-02-07 ENCOUNTER — Inpatient Hospital Stay (HOSPITAL_COMMUNITY): Payer: Medicare PPO | Admitting: Physician Assistant

## 2021-02-07 ENCOUNTER — Encounter (HOSPITAL_COMMUNITY)
Admission: RE | Disposition: A | Discharge status: Home / Self Care | Payer: Self-pay | Source: Home / Self Care | Attending: Thoracic Surgery (Cardiothoracic Vascular Surgery)

## 2021-02-07 ENCOUNTER — Inpatient Hospital Stay (HOSPITAL_COMMUNITY): Payer: Medicare PPO | Admitting: Certified Registered Nurse Anesthetist

## 2021-02-07 ENCOUNTER — Encounter (HOSPITAL_COMMUNITY): Payer: Self-pay | Admitting: Thoracic Surgery (Cardiothoracic Vascular Surgery)

## 2021-02-07 DIAGNOSIS — Z87891 Personal history of nicotine dependence: Secondary | ICD-10-CM | POA: Diagnosis not present

## 2021-02-07 DIAGNOSIS — I700 Atherosclerosis of aorta: Secondary | ICD-10-CM | POA: Diagnosis present

## 2021-02-07 DIAGNOSIS — I2510 Atherosclerotic heart disease of native coronary artery without angina pectoris: Secondary | ICD-10-CM | POA: Diagnosis present

## 2021-02-07 DIAGNOSIS — E559 Vitamin D deficiency, unspecified: Secondary | ICD-10-CM | POA: Diagnosis present

## 2021-02-07 DIAGNOSIS — Z09 Encounter for follow-up examination after completed treatment for conditions other than malignant neoplasm: Secondary | ICD-10-CM

## 2021-02-07 DIAGNOSIS — R000 Tachycardia, unspecified: Secondary | ICD-10-CM | POA: Diagnosis present

## 2021-02-07 DIAGNOSIS — C770 Secondary and unspecified malignant neoplasm of lymph nodes of head, face and neck: Secondary | ICD-10-CM | POA: Diagnosis present

## 2021-02-07 DIAGNOSIS — Z8249 Family history of ischemic heart disease and other diseases of the circulatory system: Secondary | ICD-10-CM | POA: Diagnosis not present

## 2021-02-07 DIAGNOSIS — Z825 Family history of asthma and other chronic lower respiratory diseases: Secondary | ICD-10-CM

## 2021-02-07 DIAGNOSIS — M0579 Rheumatoid arthritis with rheumatoid factor of multiple sites without organ or systems involvement: Secondary | ICD-10-CM | POA: Diagnosis present

## 2021-02-07 DIAGNOSIS — Z823 Family history of stroke: Secondary | ICD-10-CM

## 2021-02-07 DIAGNOSIS — Z20822 Contact with and (suspected) exposure to covid-19: Secondary | ICD-10-CM | POA: Diagnosis present

## 2021-02-07 DIAGNOSIS — J95811 Postprocedural pneumothorax: Secondary | ICD-10-CM | POA: Diagnosis not present

## 2021-02-07 DIAGNOSIS — D62 Acute posthemorrhagic anemia: Secondary | ICD-10-CM | POA: Diagnosis not present

## 2021-02-07 DIAGNOSIS — C7931 Secondary malignant neoplasm of brain: Secondary | ICD-10-CM | POA: Diagnosis present

## 2021-02-07 DIAGNOSIS — I3139 Other pericardial effusion (noninflammatory): Principal | ICD-10-CM | POA: Diagnosis present

## 2021-02-07 DIAGNOSIS — R197 Diarrhea, unspecified: Secondary | ICD-10-CM | POA: Diagnosis not present

## 2021-02-07 DIAGNOSIS — Z9221 Personal history of antineoplastic chemotherapy: Secondary | ICD-10-CM | POA: Diagnosis not present

## 2021-02-07 DIAGNOSIS — J939 Pneumothorax, unspecified: Secondary | ICD-10-CM

## 2021-02-07 DIAGNOSIS — Z9889 Other specified postprocedural states: Secondary | ICD-10-CM

## 2021-02-07 DIAGNOSIS — C3431 Malignant neoplasm of lower lobe, right bronchus or lung: Secondary | ICD-10-CM | POA: Diagnosis present

## 2021-02-07 DIAGNOSIS — K219 Gastro-esophageal reflux disease without esophagitis: Secondary | ICD-10-CM | POA: Diagnosis present

## 2021-02-07 DIAGNOSIS — Z820 Family history of epilepsy and other diseases of the nervous system: Secondary | ICD-10-CM | POA: Diagnosis not present

## 2021-02-07 DIAGNOSIS — J90 Pleural effusion, not elsewhere classified: Secondary | ICD-10-CM | POA: Diagnosis present

## 2021-02-07 DIAGNOSIS — E440 Moderate protein-calorie malnutrition: Secondary | ICD-10-CM | POA: Insufficient documentation

## 2021-02-07 HISTORY — DX: Other specified postprocedural states: Z98.890

## 2021-02-07 HISTORY — PX: XI ROBOTIC ASSISTED PERICARDIAL WINDOW: SHX6870

## 2021-02-07 HISTORY — PX: THORACENTESIS: SHX235

## 2021-02-07 LAB — ABO/RH: ABO/RH(D): O POS

## 2021-02-07 SURGERY — CREATION, PERICARDIAL WINDOW, ROBOT-ASSISTED
Anesthesia: General | Laterality: Right

## 2021-02-07 MED ORDER — LIDOCAINE 2% (20 MG/ML) 5 ML SYRINGE
INTRAMUSCULAR | Status: DC | PRN
  Administered 2021-02-07: 60 mg via INTRAVENOUS

## 2021-02-07 MED ORDER — ACETAMINOPHEN 10 MG/ML IV SOLN: 700.0000 mg | Freq: Once | INTRAVENOUS | Status: DC | PRN

## 2021-02-07 MED ORDER — LACTATED RINGERS IV SOLN: INTRAVENOUS | Status: DC | PRN

## 2021-02-07 MED ORDER — CHLORHEXIDINE GLUCONATE 0.12 % MT SOLN
15.0000 mL | Freq: Once | OROMUCOSAL | Status: AC
  Administered 2021-02-07: 15 mL via OROMUCOSAL
  Filled 2021-02-07: qty 15

## 2021-02-07 MED ORDER — ENOXAPARIN SODIUM 40 MG/0.4ML IJ SOSY
40.0000 mg | PREFILLED_SYRINGE | Freq: Every day | INTRAMUSCULAR | Status: DC
  Administered 2021-02-08 – 2021-02-09 (×2): 40 mg via SUBCUTANEOUS
  Filled 2021-02-07 (×2): qty 0.4

## 2021-02-07 MED ORDER — CEFAZOLIN SODIUM-DEXTROSE 2-4 GM/100ML-% IV SOLN
2.0000 g | Freq: Three times a day (TID) | INTRAVENOUS | Status: AC
  Administered 2021-02-08: 2 g via INTRAVENOUS
  Filled 2021-02-07 (×2): qty 100

## 2021-02-07 MED ORDER — ROCURONIUM BROMIDE 10 MG/ML (PF) SYRINGE
PREFILLED_SYRINGE | INTRAVENOUS | Status: DC | PRN
  Administered 2021-02-07: 60 mg via INTRAVENOUS

## 2021-02-07 MED ORDER — ACETAMINOPHEN 500 MG PO TABS
1000.0000 mg | ORAL_TABLET | Freq: Four times a day (QID) | ORAL | Status: DC
  Administered 2021-02-07 – 2021-02-09 (×6): 1000 mg via ORAL
  Filled 2021-02-07 (×6): qty 2

## 2021-02-07 MED ORDER — TRAMADOL HCL 50 MG PO TABS: 50.0000 mg | ORAL_TABLET | Freq: Four times a day (QID) | ORAL | Status: DC | PRN

## 2021-02-07 MED ORDER — LACTATED RINGERS IV SOLN: INTRAVENOUS | Status: DC

## 2021-02-07 MED ORDER — SENNOSIDES-DOCUSATE SODIUM 8.6-50 MG PO TABS
1.0000 | ORAL_TABLET | Freq: Every day | ORAL | Status: DC
  Administered 2021-02-07 – 2021-02-08 (×2): 1 via ORAL
  Filled 2021-02-07 (×2): qty 1

## 2021-02-07 MED ORDER — CEFAZOLIN SODIUM-DEXTROSE 2-4 GM/100ML-% IV SOLN
2.0000 g | INTRAVENOUS | Status: AC
  Administered 2021-02-07: 2 g via INTRAVENOUS

## 2021-02-07 MED ORDER — ORAL CARE MOUTH RINSE: 15.0000 mL | Freq: Once | OROMUCOSAL | Status: AC

## 2021-02-07 MED ORDER — FENTANYL CITRATE (PF) 250 MCG/5ML IJ SOLN
INTRAMUSCULAR | Status: DC | PRN
  Administered 2021-02-07: 100 ug via INTRAVENOUS

## 2021-02-07 MED ORDER — BUPIVACAINE LIPOSOME 1.3 % IJ SUSP
INTRAMUSCULAR | Status: DC | PRN
  Administered 2021-02-07: 50 mL

## 2021-02-07 MED ORDER — MIDAZOLAM HCL 2 MG/2ML IJ SOLN
INTRAMUSCULAR | Status: AC
  Filled 2021-02-07: qty 2

## 2021-02-07 MED ORDER — ENSURE ENLIVE PO LIQD
237.0000 mL | Freq: Two times a day (BID) | ORAL | Status: DC
  Administered 2021-02-08: 237 mL via ORAL

## 2021-02-07 MED ORDER — KETOROLAC TROMETHAMINE 15 MG/ML IJ SOLN
15.0000 mg | Freq: Four times a day (QID) | INTRAMUSCULAR | Status: DC
  Administered 2021-02-07 – 2021-02-09 (×6): 15 mg via INTRAVENOUS
  Filled 2021-02-07 (×6): qty 1

## 2021-02-07 MED ORDER — MORPHINE SULFATE (PF) 2 MG/ML IV SOLN: 2.0000 mg | INTRAVENOUS | Status: DC | PRN

## 2021-02-07 MED ORDER — VASOPRESSIN 20 UNIT/ML IV SOLN
INTRAVENOUS | Status: AC
  Filled 2021-02-07: qty 1

## 2021-02-07 MED ORDER — DEXAMETHASONE SODIUM PHOSPHATE 10 MG/ML IJ SOLN
INTRAMUSCULAR | Status: DC | PRN
  Administered 2021-02-07: 5 mg via INTRAVENOUS

## 2021-02-07 MED ORDER — BUPIVACAINE LIPOSOME 1.3 % IJ SUSP
INTRAMUSCULAR | Status: AC
  Filled 2021-02-07: qty 20

## 2021-02-07 MED ORDER — 0.9 % SODIUM CHLORIDE (POUR BTL) OPTIME
TOPICAL | Status: DC | PRN
  Administered 2021-02-07: 2000 mL

## 2021-02-07 MED ORDER — FOLIC ACID 1 MG PO TABS
1.0000 mg | ORAL_TABLET | Freq: Every day | ORAL | Status: DC
  Administered 2021-02-07 – 2021-02-10 (×4): 1 mg via ORAL
  Filled 2021-02-07 (×4): qty 1

## 2021-02-07 MED ORDER — PROPOFOL 10 MG/ML IV BOLUS
INTRAVENOUS | Status: AC
  Filled 2021-02-07: qty 20

## 2021-02-07 MED ORDER — BISACODYL 5 MG PO TBEC
10.0000 mg | DELAYED_RELEASE_TABLET | Freq: Every day | ORAL | Status: DC
  Administered 2021-02-07 – 2021-02-09 (×3): 10 mg via ORAL
  Filled 2021-02-07 (×3): qty 2

## 2021-02-07 MED ORDER — SUGAMMADEX SODIUM 200 MG/2ML IV SOLN
INTRAVENOUS | Status: DC | PRN
  Administered 2021-02-07: 100 mg via INTRAVENOUS

## 2021-02-07 MED ORDER — PROPOFOL 10 MG/ML IV BOLUS
INTRAVENOUS | Status: DC | PRN
Start: 2021-02-07 — End: 2021-02-07
  Administered 2021-02-07: 110 mg via INTRAVENOUS

## 2021-02-07 MED ORDER — FENTANYL CITRATE (PF) 100 MCG/2ML IJ SOLN: 25.0000 ug | INTRAMUSCULAR | Status: DC | PRN

## 2021-02-07 MED ORDER — ONDANSETRON HCL 4 MG/2ML IJ SOLN
4.0000 mg | Freq: Four times a day (QID) | INTRAMUSCULAR | Status: DC | PRN
  Administered 2021-02-10: 4 mg via INTRAVENOUS
  Filled 2021-02-07: qty 2

## 2021-02-07 MED ORDER — ONDANSETRON HCL 4 MG/2ML IJ SOLN
INTRAMUSCULAR | Status: DC | PRN
  Administered 2021-02-07: 4 mg via INTRAVENOUS

## 2021-02-07 MED ORDER — CEFAZOLIN SODIUM-DEXTROSE 2-4 GM/100ML-% IV SOLN
INTRAVENOUS | Status: AC
  Administered 2021-02-07: 2 g via INTRAVENOUS
  Filled 2021-02-07: qty 100

## 2021-02-07 MED ORDER — BUPIVACAINE HCL (PF) 0.5 % IJ SOLN
INTRAMUSCULAR | Status: AC
  Filled 2021-02-07: qty 30

## 2021-02-07 MED ORDER — FENTANYL CITRATE (PF) 250 MCG/5ML IJ SOLN
INTRAMUSCULAR | Status: AC
  Filled 2021-02-07: qty 5

## 2021-02-07 MED ORDER — ACETAMINOPHEN 160 MG/5ML PO SOLN
1000.0000 mg | Freq: Four times a day (QID) | ORAL | Status: DC
  Administered 2021-02-09: 1000 mg via ORAL
  Filled 2021-02-07: qty 40.6

## 2021-02-07 SURGICAL SUPPLY — 39 items
DEFOGGER SCOPE WARMER CLEARIFY (MISCELLANEOUS) ×3 IMPLANT
DERMABOND ADVANCED (GAUZE/BANDAGES/DRESSINGS) ×1
DERMABOND ADVANCED .7 DNX12 (GAUZE/BANDAGES/DRESSINGS) ×2 IMPLANT
DRAIN CHANNEL 19F RND (DRAIN) ×3 IMPLANT
DRAIN CONNECTOR BLAKE 1:1 (MISCELLANEOUS) ×3 IMPLANT
DRAPE ARM DVNC X/XI (DISPOSABLE) ×8 IMPLANT
DRAPE COLUMN DVNC XI (DISPOSABLE) ×2 IMPLANT
DRAPE CV SPLIT W-CLR ANES SCRN (DRAPES) ×3 IMPLANT
DRAPE DA VINCI XI ARM (DISPOSABLE) ×12
DRAPE DA VINCI XI COLUMN (DISPOSABLE) ×3
DRAPE ORTHO SPLIT 77X108 STRL (DRAPES) ×3
DRAPE SURG ORHT 6 SPLT 77X108 (DRAPES) ×2 IMPLANT
DRSG TEGADERM 2-3/8X2-3/4 SM (GAUZE/BANDAGES/DRESSINGS) ×3 IMPLANT
ELECT REM PT RETURN 9FT ADLT (ELECTROSURGICAL) ×3 IMPLANT
ELECTRODE REM PT RTRN 9FT ADLT (ELECTROSURGICAL) ×2 IMPLANT
EVACUATOR SILICONE 100CC (DRAIN) IMPLANT
GAUZE SPONGE 4X4 12PLY STRL (GAUZE/BANDAGES/DRESSINGS) ×3 IMPLANT
GLOVE SURG ENC MOIS LTX SZ7 (GLOVE) ×6 IMPLANT
GOWN STRL REUS W/ TWL LRG LVL3 (GOWN DISPOSABLE) ×2 IMPLANT
GOWN STRL REUS W/ TWL XL LVL3 (GOWN DISPOSABLE) ×2 IMPLANT
GOWN STRL REUS W/TWL LRG LVL3 (GOWN DISPOSABLE) ×3
GOWN STRL REUS W/TWL XL LVL3 (GOWN DISPOSABLE) ×3
PACK CHEST (CUSTOM PROCEDURE TRAY) ×3 IMPLANT
PAD ARMBOARD 7.5X6 YLW CONV (MISCELLANEOUS) ×6 IMPLANT
PAD ELECT DEFIB RADIOL ZOLL (MISCELLANEOUS) ×3 IMPLANT
SEAL CANN UNIV 5-8 DVNC XI (MISCELLANEOUS) ×6 IMPLANT
SEAL XI 5MM-8MM UNIVERSAL (MISCELLANEOUS) ×9
SET TRI-LUMEN FLTR TB AIRSEAL (TUBING) ×3 IMPLANT
SUT SILK  1 MH (SUTURE) ×3
SUT SILK 1 MH (SUTURE) ×2 IMPLANT
SUT VIC AB 3-0 SH 27 (SUTURE) ×6
SUT VIC AB 3-0 SH 27X BRD (SUTURE) ×6 IMPLANT
SUT VICRYL 0 UR6 27IN ABS (SUTURE) ×6 IMPLANT
SYSTEM RETRIEVAL ANCHOR 8 (MISCELLANEOUS) IMPLANT
SYSTEM SAHARA CHEST DRAIN ATS (WOUND CARE) IMPLANT
TAPE CLOTH 4X10 WHT NS (GAUZE/BANDAGES/DRESSINGS) ×3 IMPLANT
TRAP SPECIMEN MUCUS 40CC (MISCELLANEOUS) ×6 IMPLANT
TRAY FOLEY SLVR 16FR TEMP STAT (SET/KITS/TRAYS/PACK) ×3 IMPLANT
TRAY WAYNE PNEUMOTHORAX 14X18 (TRAY / TRAY PROCEDURE) ×3 IMPLANT

## 2021-02-07 NOTE — Progress Notes (Signed)
   02/07/21 1924  Assess: MEWS Score  Temp 97.7 F (36.5 C)  BP 130/73  Pulse Rate (!) 108  ECG Heart Rate (!) 109  Resp (!) 24  SpO2 99 %  O2 Device Nasal Cannula  Patient Activity (if Appropriate) In bed  O2 Flow Rate (L/min) 2 L/min  Assess: MEWS Score  MEWS Temp 0  MEWS Systolic 0  MEWS Pulse 1  MEWS RR 1  MEWS LOC 0  MEWS Score 2  MEWS Score Color Yellow  Assess: if the MEWS score is Yellow or Red  Were vital signs taken at a resting state? Yes  Focused Assessment No change from prior assessment  Early Detection of Sepsis Score *See Row Information* Medium  MEWS guidelines implemented *See Row Information* Yes  Treat  MEWS Interventions Administered scheduled meds/treatments  Pain Scale 0-10  Pain Score 0  Take Vital Signs  Increase Vital Sign Frequency  Yellow: Q 2hr X 2 then Q 4hr X 2, if remains yellow, continue Q 4hrs  Escalate  MEWS: Escalate Yellow: discuss with charge nurse/RN and consider discussing with provider and RRT  Notify: Charge Nurse/RN  Name of Charge Nurse/RN Notified Tanya  Date Charge Nurse/RN Notified 02/07/21  Time Charge Nurse/RN Notified 1924  Document  Patient Outcome Other (Comment) (stable)  Progress note created (see row info) Yes

## 2021-02-07 NOTE — Transfer of Care (Signed)
Immediate Anesthesia Transfer of Care Note  Patient: Susan Holder  Procedure(s) Performed: XI ROBOTIC ASSISTED THORACOSCOPY PERICARDIAL WINDOW (Left) THORACENTESIS (Right)  Patient Location: PACU  Anesthesia Type:General  Level of Consciousness: drowsy  Airway & Oxygen Therapy: Patient Spontanous Breathing and Patient connected to face mask oxygen  Post-op Assessment: Report given to RN and Post -op Vital signs reviewed and stable  Post vital signs: Reviewed and stable  Last Vitals:  Vitals Value Taken Time  BP 156/72 02/07/21 1655  Temp 37.1 C 02/07/21 1655  Pulse 110 02/07/21 1706  Resp 28 02/07/21 1706  SpO2 96 % 02/07/21 1706  Vitals shown include unvalidated device data.  Last Pain:  Vitals:   02/07/21 1655  TempSrc:   PainSc: 0-No pain         Complications: No notable events documented.

## 2021-02-07 NOTE — Interval H&P Note (Signed)
History and Physical Interval Note:  02/07/2021 2:22 PM  Susan Holder  has presented today for surgery, with the diagnosis of PERICARDIAL EFFUSIONS.  The various methods of treatment have been discussed with the patient and family. After consideration of risks, benefits and other options for treatment, the patient has consented to  Procedure(s): XI ROBOTIC ASSISTED THORACOSCOPY PERICARDIAL WINDOW (Left) THORACENTESIS (Right) as a surgical intervention.  The patient's history has been reviewed, patient examined, no change in status, stable for surgery.  I have reviewed the patient's chart and labs.  Questions were answered to the patient's satisfaction.     Jak Haggar Bary Leriche

## 2021-02-07 NOTE — Brief Op Note (Signed)
02/07/2021  4:19 PM  PATIENT:  Susan Holder  65 y.o. female  PRE-OPERATIVE DIAGNOSIS:   PERICARDIAL EFFUSION and RIGHT PLEURAL EFFUSION STAGE 4 ADENOCARCINOMA RIGHT LUNG  POST-OPERATIVE DIAGNOSIS:   PERICARDIAL EFFUSION and RIGHT PLEURAL EFFUSION STAGE 4 ADENOCARCINOMA RIGHT LUNG  PROCEDURES:  XI ROBOTIC ASSISTED THORACOSCOPY PERICARDIAL WINDOW (Left)  THORACENTESIS (Right)  SURGEON:  Lajuana Matte, MD - Primary  PHYSICIAN ASSISTANT: Tytiana Coles  ASSISTANTS: OR staff   ANESTHESIA:   general  EBL:  <1ml  BLOOD ADMINISTERED:none  DRAINS: (28) Blake drain(s) in the left pleural space    LOCAL MEDICATIONS USED:  BUPIVICAINE   SPECIMEN:  No Specimen  DISPOSITION OF SPECIMEN:  N/A  COUNTS:  YES  DICTATION: .Dragon Dictation  PLAN OF CARE: Admit to inpatient   PATIENT DISPOSITION:  PACU - hemodynamically stable.   Delay start of Pharmacological VTE agent (>24hrs) due to surgical blood loss or risk of bleeding: yes

## 2021-02-07 NOTE — Progress Notes (Signed)
Called Dr. Roxan Hockey regarding chest X-Ray results. Made MD aware of results.

## 2021-02-07 NOTE — Progress Notes (Signed)
      [address]       [place] [address]             [phone]      CTSP for CXR showing pneumothorax on right after thoracentesis earlier today.  Susan Holder is resting comfortably. HR 108 (was 120s preop) and BP 116/70 100 % sat on 2L [facility]  Diminished BS on Right  I reviewed the CXR. Findings c/w pneumo ex vacuo.  Likely can be managed safely without Ct placement  Will continue to monitor and repeat CXR in AM  [name] C. Roxan Hockey, MD [facility] [phone]

## 2021-02-07 NOTE — Progress Notes (Signed)
Admission from the PACU by bed awake and alert.

## 2021-02-07 NOTE — Anesthesia Procedure Notes (Addendum)
Procedure Name: Intubation Date/Time: 02/07/2021 3:01 PM Performed by: Reece Agar, CRNA Pre-anesthesia Checklist: Patient identified, Emergency Drugs available, Suction available, Patient being monitored and Timeout performed Patient Re-evaluated:Patient Re-evaluated prior to induction Oxygen Delivery Method: Circle system utilized Preoxygenation: Pre-oxygenation with 100% oxygen Induction Type: IV induction Ventilation: Mask ventilation without difficulty Laryngoscope Size: Mac and 3 Grade View: Grade II Endobronchial tube: Double lumen EBT, EBT position confirmed by auscultation, EBT position confirmed by fiberoptic bronchoscope and Left and 35 Fr Number of attempts: 2 Airway Equipment and Method: Stylet and Fiberoptic brochoscope Placement Confirmation: ETT inserted through vocal cords under direct vision and positive ETCO2 Secured at: 27 cm Tube secured with: Tape Dental Injury: Teeth and Oropharynx as per pre-operative assessment  Comments: SRNA attempt DL with MAC 3, grade II view. CRNA DL with MAC 3, grade II view, tube passed under direct visualization confirmed placement with fiberoptic bronchoscope

## 2021-02-07 NOTE — Op Note (Signed)
      [address]       [place] [address]             [phone]        02/07/2021  Patient:  Susan Holder Pre-Op Dx: Stage IV right-sided lung cancer   Pericardial effusion   Right pleural effusion Post-op Dx: Same Procedure: - Robotic assisted left video thoracoscopy - Pericardial window - Right pleurocentesis - Intercostal nerve block  Surgeon and Role:      * Lajuana Matte, MD - Primary    *M. Roddenberry, PA-C- assisting  Anesthesia  general EBL: 10 ml Blood Administration: None Specimen: Pericardium  Drains: 19 F chest tube in left chest Counts: correct   Indications: 65 year old female with right-sided lung cancer that is stage IV who now has a right-sided pleural effusion and moderate-sized pericardial effusion on cross-sectional imaging.  She is quite symptomatic from this.  She has previously received radiation on the right side of this we discussed the possibility of undergoing a right-sided thoracentesis and a left-sided robotic assisted thoracoscopy with pericardial window.  She is agreeable to proceed. Findings: 51mls of fluid drained from the left chest and pericardium.  627mls of straw colored fluid drained from the right chest  Operative Technique: After the risks, benefits and alternatives were thoroughly discussed, the patient was brought to the operative theatre.  Anesthesia was induced, and the patient was then placed in a right lazy lateral decubitus position and was prepped and draped in normal sterile fashion.  An appropriate surgical pause was performed, and pre-operative antibiotics were dosed accordingly.  We began by placing our 3 robotic ports in the the intercostal spaces targeting the pericardium.  The robot was then docked and all instruments were passed under direct visualization.    The pericardium was visualized, and a 3 x 3 cm centimeters pericardial window was created using Bovie cautery.  We then opened  the pericardium inferiorly below the phrenic nerve to ensure for adequate drainage as well.  The pericardium was thickened.  Immediate release of serosanguineous pericardial fluid was released.  The fluid and pericardium were sent for specimen.  A 19 [place] Blake drain was passed through right inferior robotic port into the pericardium.  An intercostal nerve block was performed under direct visualization.  The skin and soft tissue were closed with absorbable suture.  We then performed a thoracentesis in the right pleural space.  Approximately 600 mL straw-colored fluid was removed from the chest.  The patient tolerated the procedure without any immediate complications, and was transferred to the PACU in stable condition.  Tyrrell Stephens Bary Leriche

## 2021-02-07 NOTE — Hospital Course (Addendum)
HPI: This is a 65 y.o. female with a history of metastatic non-small cell lung cancer on the right presents for follow-up in regards to moderate sized pericardial effusion, and a right-sided pleural effusion.  She continues to have some tachycardia as well as some exertional dyspnea.  She would like to proceed with surgical drainage of these effusions. Dr. Kipp Brood discussed the need for robotic assisted right thoracentesis and drainage of pericardial effusion. Potential risks, benefits, and complications of the surgery were discussed with the patient and she agreed to proceed with surgery.  Hospital Course: Patient was extubated and transferred from the OR to PACU in stable condition. A line and foley were removed. Chest tube was placed to water seal and there was no air leak. Daily chest x rays were obtained and remained stable. Of note, drainage of the right pleural effusion during surgery did result in a small right apical pneumothorax post op. This did remain stable and did not require a chest tube. Chest tube remained until drainage negligible and it was removed on 10/**. Patient has been tolerating a diet. She is ambulating with good oxygenation on room air. Her wounds are clean, dry, and healing without signs of infection. She is felt surgically stable for discharge today.

## 2021-02-08 ENCOUNTER — Other Ambulatory Visit: Payer: Self-pay | Admitting: Internal Medicine

## 2021-02-08 ENCOUNTER — Encounter (HOSPITAL_COMMUNITY): Payer: Self-pay | Admitting: Thoracic Surgery (Cardiothoracic Vascular Surgery)

## 2021-02-08 ENCOUNTER — Inpatient Hospital Stay (HOSPITAL_COMMUNITY): Payer: Medicare PPO

## 2021-02-08 LAB — BASIC METABOLIC PANEL
Anion gap: 7 (ref 5–15)
BUN: 13 mg/dL (ref 8–23)
CO2: 27 mmol/L (ref 22–32)
Calcium: 9.3 mg/dL (ref 8.9–10.3)
Chloride: 100 mmol/L (ref 98–111)
Creatinine, Ser: 1.29 mg/dL — ABNORMAL HIGH (ref 0.44–1.00)
GFR, Estimated: 45 mL/min — ABNORMAL LOW (ref >60)
Glucose, Bld: 142 mg/dL — ABNORMAL HIGH (ref 70–99)
Potassium: 4.4 mmol/L (ref 3.5–5.1)
Sodium: 134 mmol/L — ABNORMAL LOW (ref 135–145)

## 2021-02-08 LAB — CBC
HCT: 23.9 % — ABNORMAL LOW (ref 36.0–46.0)
Hemoglobin: 7.5 g/dL — ABNORMAL LOW (ref 12.0–15.0)
MCH: 34.6 pg — ABNORMAL HIGH (ref 26.0–34.0)
MCHC: 31.4 g/dL (ref 30.0–36.0)
MCV: 110.1 fL — ABNORMAL HIGH (ref 80.0–100.0)
Platelets: 204 10*3/uL (ref 150–400)
RBC: 2.17 MIL/uL — ABNORMAL LOW (ref 3.87–5.11)
RDW: 14.9 % (ref 11.5–15.5)
WBC: 6.1 10*3/uL (ref 4.0–10.5)
nRBC: 0 % (ref 0.0–0.2)

## 2021-02-08 MED ORDER — ADULT MULTIVITAMIN W/MINERALS CH
1.0000 | ORAL_TABLET | Freq: Every day | ORAL | Status: DC
  Administered 2021-02-08 – 2021-02-10 (×3): 1 via ORAL
  Filled 2021-02-08 (×3): qty 1

## 2021-02-08 NOTE — Anesthesia Postprocedure Evaluation (Signed)
Anesthesia Post Note  Patient: [name]  Procedure(s) Performed: XI ROBOTIC ASSISTED THORACOSCOPY PERICARDIAL WINDOW (Left) THORACENTESIS (Right)     Patient location during evaluation: PACU Anesthesia Type: General Level of consciousness: awake and alert Pain management: pain level controlled Vital Signs Assessment: post-procedure vital signs reviewed and stable Respiratory status: spontaneous breathing, nonlabored ventilation, respiratory function stable and patient connected to nasal cannula oxygen Cardiovascular status: blood pressure returned to baseline and stable Postop Assessment: no apparent nausea or vomiting Anesthetic complications: no   No notable events documented.  Last Vitals:  Vitals:   02/08/21 1600 02/08/21 1938  BP:  115/60  Pulse:  100  Resp:  20  Temp: 36.9 C 36.5 C  SpO2:  98%    Last Pain:  Vitals:   02/08/21 1938  TempSrc: Oral  PainSc:                  March Rummage Maveryk Renstrom

## 2021-02-08 NOTE — Discharge Summary (Signed)
Physician Discharge Summary       [address]       [place] [address]             [phone]    Patient ID: Susan Holder MRN: [id number] DOB/AGE: 07/09/1955 65 y.o.  Admit date: 02/07/2021 Discharge date: 02/10/2021  Admission Diagnoses: Pericardial effusion 2. Right pleural effusion Discharge Diagnoses:  Xi robotic assisted right video thoracoscopy,drainage of right pleural effusion and  pericardial window   2. History of the following: Acid reflux 09/21/2018    Adenocarcinoma of right lung, stage 4 (HCC) 11/17/2017   Allergic rhinitis 08/22/2013   ANA positive 05/29/2016   Aortic atherosclerosis (HCC) 10/23/2020   Brain metastases (HCC) 03/29/2019   Coronary artery calcification seen on CT scan 10/23/2020   DDD (degenerative disc disease), cervical 05/29/2016   Encounter for antineoplastic chemotherapy 11/17/2017   Encounter for antineoplastic immunotherapy 02/01/2018   Goals of care, counseling/discussion 11/17/2017   High risk medication use 05/29/2016    04/29/2016: ==> plq 200 am & 100 qhs(adeq response).   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) 11/04/2018   Other fatigue 05/29/2016   Pericardial effusion 10/23/2020   Primary malignant neoplasm of bronchus of right lower lobe (HCC) 12/21/2017   Rheumatoid arthritis (HCC)     Rheumatoid arthritis involving multiple sites with positive rheumatoid factor (HCC) 05/29/2016    +RF +ANA +CCP    Smoker 05/29/2016   Trigger finger, left ring finger 05/29/2016   Trigger finger, right ring finger 05/29/2016   Vitamin D deficiency     Consults: None  Procedure (s):  PROCEDURES:  XI ROBOTIC ASSISTED THORACOSCOPY PERICARDIAL WINDOW (Left)   THORACENTESIS (Right)   SURGEON:  Lightfoot, Harrell O, MD - Primary   PHYSICIAN ASSISTANT: Roddenberry   ASSISTANTS: OR staff    ANESTHESIA:    general History of Presenting Illness: This is a 65 y.o. female with a history of metastatic non-small cell lung cancer on the right presents for follow-up in regards to moderate sized pericardial effusion, and a right-sided pleural effusion.  She continues to have some tachycardia as well as some exertional dyspnea.  She would like to proceed with surgical drainage of these effusions. Dr. Lightfoot discussed the need for robotic assisted right thoracentesis and drainage of pericardial effusion. Potential risks, benefits, and complications of the surgery were discussed with the patient and she agreed to proceed with surgery.   Hospital Course: Patient was extubated and transferred from the OR to PACU in stable condition. A line and foley were removed. Chest tube was placed to water seal and there was no air leak. Daily chest x rays were obtained and remained stable. Of note, drainage of the right pleural effusion during surgery did result in a small right apical pneumothorax post op. This did remain stable and did not require a chest tube. Chest tube remained until drainage negligible and it was removed on 10/15. Patient has been tolerating a diet.  She did develop some diarrhea which is felt most likely related to her stool softeners and laxatives. She is noted to have a baseline sinus tachycardia. Following the diarrhea she did receive a saline boulus to help with potential degree of dehydration and is encouraged to drink fluids.  She is ambulating with good oxygenation on room air. Her   wounds are clean, dry, and healing without signs of infection. She is felt surgically stable for discharge today.   Latest Vital Signs: Blood pressure 115/65, pulse (!) 108, temperature 97.6 F (36.4 C), temperature source Oral, resp. rate 20, height 5' (1.524 m), weight 48.7 kg, last menstrual period 04/28/2000, SpO2 97 %.  Physical Exam: General appearance: alert, cooperative, and no distress Heart: regular rate  and rhythm and tachy Lungs: clear except min dim in bases Abdomen: soft, non tender Extremities: no edema or calf tenderness Wound: incis healing well   Discharge Condition:Stable and discharged to home.  Recent laboratory studies:  Lab Results  Component Value Date   WBC 8.6 02/10/2021   HGB 10.7 (L) 02/10/2021   HCT 31.9 (L) 02/10/2021   MCV 98.5 02/10/2021   PLT 265 02/10/2021   Lab Results  Component Value Date   NA 133 (L) 02/10/2021   K 3.7 02/10/2021   CL 101 02/10/2021   CO2 23 02/10/2021   CREATININE 1.40 (H) 02/10/2021   GLUCOSE 87 02/10/2021      Diagnostic Studies: DG Chest 1 View  Result Date: 02/10/2021 CLINICAL DATA:  Tachycardia, recent pericardial window and right-sided thoracentesis EXAM: PORTABLE CHEST 1 VIEW COMPARISON:  02/09/2021, CT from 01/07/2021 FINDINGS: Cardiac shadow is stable. Aortic calcifications are again seen. Minimal right-sided pneumothorax is seen stable in appearance. Small residual fluid is noted on the right. Linear areas of scarring are noted related to the known history of lung carcinoma and stable in appearance similar to that seen on prior CTs. Previously seen catheter over the left chest wall has been removed. Pneumothorax is seen. No bony abnormality is noted. IMPRESSION: Minimal residual right pneumothorax stable from the prior exam. Interval removal of right-sided chest tube. Chronic blunting of the right costophrenic angle. Electronically Signed   By: Mark  Lukens M.D.   On: 02/10/2021 03:49   DG Chest 2 View  Result Date: 02/06/2021 CLINICAL DATA:  Preoperative respiratory evaluation. EXAM: CHEST - 2 VIEW COMPARISON:  Chest CT 01/07/2021 FINDINGS: Small left and moderate right pleural effusions with persistent soft tissue fullness in the right hilum. Right parahilar atelectasis or scarring evident. No pulmonary edema. The cardiopericardial silhouette is within normal limits for size. The visualized bony structures of the thorax  show no acute abnormality. IMPRESSION: Small left and moderate right pleural effusions with soft tissue fullness in the right hilum and right parahilar atelectasis or scarring. Electronically Signed   By: Eric  Mansell M.D.   On: 02/06/2021 08:13   DG CHEST PORT 1 VIEW  Result Date: 02/09/2021 CLINICAL DATA:  Shortness of breath EXAM: PORTABLE CHEST 1 VIEW COMPARISON:  Chest radiograph 1 day prior FINDINGS: A left-sided chest tube is in stable position. The cardiomediastinal silhouette is stable. There are small bilateral pleural effusions, right larger than left, with adjacent opacities likely reflecting subsegmental atelectasis. The small residual right pneumothorax is unchanged. There is no appreciable residual left pneumothorax. Subcutaneous emphysema in the right chest wall is unchanged. IMPRESSION: 1. Unchanged small residual right pneumothorax. Left chest tube in place with no appreciable left pneumothorax. 2. Unchanged small bilateral pleural effusions. Electronically Signed   By: Peter  Noone M.D.   On: 02/09/2021 08:50   DG Chest Port 1 View  Result Date: 02/08/2021 CLINICAL DATA:  Chest tube, status post pericardial window EXAM: PORTABLE CHEST 1 VIEW COMPARISON:  02/07/2021 FINDINGS: Small, residual right pneumothorax, no greater than 10% volume, diminished compared to prior examination. Left-sided chest tube remains in position,   without significant left-sided pneumothorax. Subcutaneous emphysema about the right chest wall. The heart and mediastinum are unremarkable. IMPRESSION: 1. Small, residual right pneumothorax, no greater than 10% volume, diminished compared to prior examination. 2. Left-sided chest tube remains in position, without significant left-sided pneumothorax. Electronically Signed   By: Delanna Ahmadi M.D.   On: 02/08/2021 09:15   DG Chest Port 1 View  Result Date: 02/07/2021 CLINICAL DATA:  Post pericardial window creation. EXAM: PORTABLE CHEST 1 VIEW COMPARISON:  02/05/2021  FINDINGS: Heart size and pulmonary vascularity are normal. Interval placement of a left chest tube. Minimal left apical pneumothorax. Moderate right pneumothorax with partial atelectasis in the right lung. Subcutaneous emphysema along the right lateral chest wall. No right chest tube is identified. No pleural effusions. Mediastinal contours appear intact. IMPRESSION: Moderate right pneumothorax with atelectasis in the right lung and right chest wall emphysema. Tiny left apical pneumothorax with left chest tube present. Critical Value/emergent results were called by telephone at the time of interpretation on 02/07/2021 at 7:54 pm to provider RN Jarrett Soho, who verbally acknowledged these results. Electronically Signed   By: Lucienne Capers M.D.   On: 02/07/2021 20:02      Discharge Medications: Allergies as of 02/10/2021   No Known Allergies      Medication List     TAKE these medications    folic acid 1 MG tablet Commonly known as: FOLVITE TAKE 1 TABLET(1 MG) BY MOUTH DAILY What changed: See the new instructions.   hydroxychloroquine 200 MG tablet Commonly known as: PLAQUENIL TAKE 1 TABLET BY MOUTH TWICE DAILY MONDAY THROUGH FRIDAY ONLY.   omeprazole 20 MG capsule Commonly known as: PRILOSEC Take 20 mg by mouth daily.   prochlorperazine 10 MG tablet Commonly known as: COMPAZINE TAKE 1 TABLET(10 MG) BY MOUTH EVERY 6 HOURS AS NEEDED FOR NAUSEA OR VOMITING   traMADol 50 MG tablet Commonly known as: ULTRAM Take 1 tablet (50 mg total) by mouth every 12 (twelve) hours as needed for up to 7 days (mild pain).   vitamin C 500 MG tablet Commonly known as: ASCORBIC ACID Take 500 mg by mouth daily.   VITAMIN D PO Take 1 tablet by mouth daily.        Follow Up Appointments:  Follow-up Information     Lajuana Matte, MD. Go on 02/22/2021.   Specialty: Cardiothoracic Surgery Why: Appointment time is at 10:50 am Contact information: [place]  [address] [phone]                 Signed: John Giovanni, PA-C

## 2021-02-08 NOTE — Progress Notes (Signed)
Initial Nutrition Assessment  DOCUMENTATION CODES:   Non-severe (moderate) malnutrition in context of chronic illness  INTERVENTION:   - Ensure Enlive po BID, each supplement provides 350 kcal and 20 grams of protein  - Provided diet education and "High Calorie, High Protein Nutrition Therapy" handout from the [employer]  - MVI with minerals daily  NUTRITION DIAGNOSIS:   Moderate Malnutrition related to chronic illness (metastatic stage IV non-small cell lung cancer) as evidenced by mild fat depletion, severe muscle depletion.  GOAL:   Patient will meet greater than or equal to 90% of their needs  MONITOR:   PO intake, Supplement acceptance, Labs, Weight trends, I & O's  REASON FOR ASSESSMENT:   Malnutrition Screening Tool    ASSESSMENT:   65 year old female who presented on 10/13 for L pericardial window, R thoracentesis for right-sided pleural effusion and moderate-sized pericardial effusion. PMH of metastatic stage IV non-small cell lung cancer on the right, RA, vitamin D deficiency.  Spoke with pt at bedside. Pt reports that her appetite is okay but that the amount that she can eat at one time is limited due to early satiety. Pt reports that prior to the pandemic, she weighed between 110-115 lbs. She states that during the pandemic, she was at home eating a lot and not going out much. She reports gaining weight up to 150 lbs during this time. Pt states that at this weight, she felt uncomfortable, had low energy, and had difficulty breathing.  Pt states that 18 months ago, she decided to start watching what she was eating and joined a gym. Pt started eating more fruits and vegetables and cut out red meat. Pt states that over the course of a year (from June/July 2021 to June/July 2022), she lost 40 lbs intentionally. Pt states that she was back to her pre-pandemic weight of 110-115 lbs. Pt was pleased with this weight loss.  In June/July of this year,  pt noticed that she was "filling up quickly" when eating and wasn't able to eat as much as she used to eat. Since that time, pt reports losing weight down to 102-105 lbs. Pt does not feel comfortable at this weight and thinks that she looks "skeletal." Pt would like to gain back weight and muscle to her previous weight of 110-115 lbs.  Reviewed weight history in chart. Pt with a total weight loss of 11.1 kg over the last year. Suspect a good portion of this weight loss can be attributed to pt's intentional efforts to lose weight with diet changes and exercise. This is an 18.6% weight loss which is not quite significant for timeframe. Pt with a 1.1 kg weight loss since 11/07/20. This is a 2.2% weight loss in 3 months which is not significant for timeframe. Based on pt's report, this is likely unintentional weight loss.  When asked about her PO intake at home, pt reports that she wakes up early and then goes back to sleep. Pt eats her first meal around 10:00 am. At this time, she will have fruit and grits or cereal and milk. Pt eats a snack around 3:00 pm which includes cheese and grapes or ice cream. Pt eats dinner around 7:00 pm. This is her "big meal" of the day. Pt will usually have a salad with dressing and croutons (no protein). Pt may eat another snack of ice cream before bed.  Pt states that she is willing to try Ensure supplements. Noted that pt refused Ensure this AM. She  states that she is now willing to drink them. Discussed with pt ways to increase kcal and protein intake in her diet at home. Provided diet education and handout  ("High Calorie, High Protein Nutrition Therapy"). Encouraged pt to consume 1-2 oral nutrition supplements daily at home. Pt is agreeable.  Medications reviewed and include: dulcolax, Ensure Enlive BID, folic acid, senna  Labs reviewed: sodium 134, creatinine 1.29, hemoglobin 7.5  UOP: 700 ml x 24 hours CT: 160 ml x 24 hours I/O's: +820 ml since admit  NUTRITION -  FOCUSED PHYSICAL EXAM:  Flowsheet Row Most Recent Value  Orbital Region Mild depletion  Upper Arm Region No depletion  Thoracic and Lumbar Region Mild depletion  Buccal Region Mild depletion  Temple Region Mild depletion  Clavicle Bone Region Severe depletion  Clavicle and Acromion Bone Region Severe depletion  Scapular Bone Region Moderate depletion  Dorsal Hand Mild depletion  Patellar Region Mild depletion  Anterior Thigh Region Moderate depletion  Posterior Calf Region Mild depletion  Edema (RD Assessment) None  Hair Reviewed  Eyes Reviewed  Mouth Reviewed  Skin Reviewed  Nails Reviewed       Diet Order:   Diet Order             Diet regular Room service appropriate? Yes; Fluid consistency: Thin  Diet effective now                   EDUCATION NEEDS:   Education needs have been addressed  Skin:  Skin Assessment: Skin Integrity Issues: Incisions: chest  Last BM:  02/06/21  Height:   Ht Readings from Last 1 Encounters:  02/08/21 5' (1.524 m)    Weight:   Wt Readings from Last 1 Encounters:  02/08/21 48.7 kg    BMI:  Body mass index is 20.97 kg/m.  Estimated Nutritional Needs:   Kcal:  1450-1650  Protein:  65-80 grams  Fluid:  1.4-1.6 L    Gustavus Bryant, MS, RD, LDN Inpatient Clinical Dietitian Please see [facility] for contact information.

## 2021-02-08 NOTE — Plan of Care (Signed)
  Problem: Education: Goal: Knowledge of disease or condition will improve Outcome: Progressing Goal: Knowledge of the prescribed therapeutic regimen will improve Outcome: Progressing   Problem: Activity: Goal: Risk for activity intolerance will decrease Outcome: Progressing   Problem: Cardiac: Goal: Will achieve and/or maintain hemodynamic stability Outcome: Progressing   Problem: Clinical Measurements: Goal: Postoperative complications will be avoided or minimized Outcome: Progressing   Problem: Respiratory: Goal: Respiratory status will improve Outcome: Progressing   Problem: Pain Management: Goal: Pain level will decrease Outcome: Progressing   Problem: Skin Integrity: Goal: Wound healing without signs and symptoms infection will improve Outcome: Progressing   Problem: Education: Goal: Knowledge of General Education information will improve Description: Including pain rating scale, medication(s)/side effects and non-pharmacologic comfort measures Outcome: Progressing   Problem: Clinical Measurements: Goal: Ability to maintain clinical measurements within normal limits will improve Outcome: Progressing Goal: Will remain free from infection Outcome: Progressing Goal: Respiratory complications will improve Outcome: Progressing Goal: Cardiovascular complication will be avoided Outcome: Progressing   Problem: Activity: Goal: Risk for activity intolerance will decrease Outcome: Progressing   Problem: Nutrition: Goal: Adequate nutrition will be maintained Outcome: Progressing   Problem: Coping: Goal: Level of anxiety will decrease Outcome: Progressing   Problem: Elimination: Goal: Will not experience complications related to bowel motility Outcome: Progressing Goal: Will not experience complications related to urinary retention Outcome: Progressing   Problem: Pain Managment: Goal: General experience of comfort will improve Outcome: Progressing    Problem: Safety: Goal: Ability to remain free from injury will improve Outcome: Progressing   Problem: Skin Integrity: Goal: Risk for impaired skin integrity will decrease Outcome: Progressing

## 2021-02-08 NOTE — Progress Notes (Signed)
Mobility Specialist Progress Note    02/08/21 1358  Mobility  Activity Ambulated in hall  Level of Assistance Independent after set-up  Assistive Device  (IV pole)  Distance Ambulated (ft) 200 ft  Mobility Ambulated independently in hallway  Mobility Response Tolerated well  Mobility performed by Mobility specialist  $Mobility charge 1 Mobility   Pre-Mobility: 103 HR, 112/56 BP, 95% SpO2 During Mobility: 142 HR Post-Mobility: 105 HR, 115/65 BP, 97% SpO2  Pt received in bed and agreeable. Upon return c/o SOB and coached pursed lip breathing sitting EOB. Pt left in bed with call bell in reach.   Hildred Alamin Mobility Specialist  Mobility Specialist Phone: [phone]

## 2021-02-08 NOTE — Progress Notes (Addendum)
      [address]       [place] [address]             [phone]       1 Day Post-Op Procedure(s) (LRB): XI ROBOTIC ASSISTED THORACOSCOPY PERICARDIAL WINDOW (Left) THORACENTESIS (Right)  Subjective: Patient states breathing is fine. She is eating breakfast.   Objective: Vital signs in last 24 hours: Temp:  [97.5 F (36.4 C)-98.8 F (37.1 C)] 97.5 F (36.4 C) (10/14 0325) Pulse Rate:  [90-126] 90 (10/14 0325) Cardiac Rhythm: Sinus tachycardia;Other (Comment) (10/13 1900) Resp:  [18-32] 20 (10/14 0325) BP: (106-156)/(57-75) 106/59 (10/14 0325) SpO2:  [93 %-100 %] 98 % (10/14 0325) Weight:  [48.7 kg] 48.7 kg (10/14 0013)     Intake/Output from previous day: 10/13 0701 - 10/14 0700 In: 1600 [P.O.:300; I.V.:1100; IV Piggyback:200] Out: 602 [Urine:450; Blood:10; Chest Tube:142]   Physical Exam:  Cardiovascular: RRR Pulmonary: Diminished breath sounds bilaterally Abdomen: Soft, non tender, bowel sounds present. Extremities: SCDs in place Wounds: Clean and dry.  No erythema or signs of infection. Chest Tube: to water seal, no air leak  Lab Results: CBC: Recent Labs    02/05/21 1422 02/08/21 0103  WBC 10.6* 6.1  HGB 8.5* 7.5*  HCT 27.7* 23.9*  PLT 349 204   BMET:  Recent Labs    02/05/21 1422 02/08/21 0103  NA 133* 134*  K 3.8 4.4  CL 99 100  CO2 22 27  GLUCOSE 139* 142*  BUN 11 13  CREATININE 1.18* 1.29*  CALCIUM 9.5 9.3    PT/INR:  Recent Labs    02/05/21 1422  LABPROT 13.7  INR 1.0   ABG:  INR: Will add last result for INR, ABG once components are confirmed Will add last 4 CBG results once components are confirmed  Assessment/Plan:  1. CV - SR with HR in the 90's.  2.  Pulmonary - On room air this am. Chest tube with 142 cc since surgery. Chest tube is to water seal, no air leak. CXR this am appears stable (subcutaneous emphysema right lateral chest wall, mod right pneumothorax (from surgery) and atelectasis, minimal  left apical pneumothorax). Chest tube to remain until dry. Check CXR in am. Encourage incentive spirometer. Await final pathology from pericardial fluid (history of metastatic NSCC right lung cancer). 3. Creatinine this am slightly increased to 1.29. On Toradol but continue and monitor creatinine 4. Expected post op blood loss anemia-H and H this am decreased to 7.5 and 23.9  Emelia Sandoval M ZimmermanPA-C 02/08/2021,6:55 AM [phone]

## 2021-02-08 NOTE — Progress Notes (Signed)
Asked ambulatory specialist early to come  and help ambulate pt, claimed to be coming. Pt. Made aware.

## 2021-02-09 ENCOUNTER — Inpatient Hospital Stay (HOSPITAL_COMMUNITY): Payer: Medicare PPO

## 2021-02-09 DIAGNOSIS — E440 Moderate protein-calorie malnutrition: Secondary | ICD-10-CM

## 2021-02-09 HISTORY — DX: Moderate protein-calorie malnutrition: E44.0

## 2021-02-09 LAB — COMPREHENSIVE METABOLIC PANEL
ALT: 8 U/L (ref 0–44)
AST: 32 U/L (ref 15–41)
Albumin: 1.8 g/dL — ABNORMAL LOW (ref 3.5–5.0)
Alkaline Phosphatase: 65 U/L (ref 38–126)
Anion gap: 9 (ref 5–15)
BUN: 25 mg/dL — ABNORMAL HIGH (ref 8–23)
CO2: 26 mmol/L (ref 22–32)
Calcium: 9 mg/dL (ref 8.9–10.3)
Chloride: 98 mmol/L (ref 98–111)
Creatinine, Ser: 1.76 mg/dL — ABNORMAL HIGH (ref 0.44–1.00)
GFR, Estimated: 31 mL/min — ABNORMAL LOW (ref >60)
Glucose, Bld: 94 mg/dL (ref 70–99)
Potassium: 3.6 mmol/L (ref 3.5–5.1)
Sodium: 133 mmol/L — ABNORMAL LOW (ref 135–145)
Total Bilirubin: 0.4 mg/dL (ref 0.3–1.2)
Total Protein: 5.8 g/dL — ABNORMAL LOW (ref 6.5–8.1)

## 2021-02-09 LAB — CBC
HCT: 22 % — ABNORMAL LOW (ref 36.0–46.0)
Hemoglobin: 7 g/dL — ABNORMAL LOW (ref 12.0–15.0)
MCH: 34 pg (ref 26.0–34.0)
MCHC: 31.8 g/dL (ref 30.0–36.0)
MCV: 106.8 fL — ABNORMAL HIGH (ref 80.0–100.0)
Platelets: 228 10*3/uL (ref 150–400)
RBC: 2.06 MIL/uL — ABNORMAL LOW (ref 3.87–5.11)
RDW: 14.6 % (ref 11.5–15.5)
WBC: 8.3 10*3/uL (ref 4.0–10.5)
nRBC: 0.6 % — ABNORMAL HIGH (ref 0.0–0.2)

## 2021-02-09 LAB — HEMOGLOBIN AND HEMATOCRIT, BLOOD
HCT: 29.5 % — ABNORMAL LOW (ref 36.0–46.0)
Hemoglobin: 9.8 g/dL — ABNORMAL LOW (ref 12.0–15.0)

## 2021-02-09 LAB — PREPARE RBC (CROSSMATCH)

## 2021-02-09 MED ORDER — SODIUM CHLORIDE 0.9% IV SOLUTION: Freq: Once | INTRAVENOUS | Status: DC

## 2021-02-09 MED ORDER — DM-GUAIFENESIN ER 30-600 MG PO TB12
1.0000 | ORAL_TABLET | Freq: Two times a day (BID) | ORAL | Status: DC
  Administered 2021-02-09 – 2021-02-10 (×3): 1 via ORAL
  Filled 2021-02-09 (×3): qty 1

## 2021-02-09 MED ORDER — ENOXAPARIN SODIUM 30 MG/0.3ML IJ SOSY
30.0000 mg | PREFILLED_SYRINGE | Freq: Every day | INTRAMUSCULAR | Status: DC
  Administered 2021-02-10: 30 mg via SUBCUTANEOUS
  Filled 2021-02-09: qty 0.3

## 2021-02-09 NOTE — Progress Notes (Addendum)
[address]       [place] [address]             [phone]      2 Days Post-Op Procedure(s) (LRB): XI ROBOTIC ASSISTED THORACOSCOPY PERICARDIAL WINDOW (Left) THORACENTESIS (Right) Subjective: Some bronchial congestion  Objective: Vital signs in last 24 hours: Temp:  [97.6 F (36.4 C)-98.5 F (36.9 C)] 97.6 F (36.4 C) (10/15 0323) Pulse Rate:  [96-100] 96 (10/15 0323) Cardiac Rhythm: Sinus tachycardia (10/15 0400) Resp:  [17-20] 20 (10/15 0323) BP: (103-115)/(60-65) 107/64 (10/15 0323) SpO2:  [97 %-99 %] 97 % (10/15 0323)  Hemodynamic parameters for last 24 hours:    Intake/Output from previous day: 10/14 0701 - 10/15 0700 In: 577 [P.O.:577] Out: 110 [Chest Tube:110] Intake/Output this shift: No intake/output data recorded.  General appearance: alert, cooperative, and no distress Heart: regular rate and rhythm Lungs: clear to auscultation bilaterally Abdomen: benign Extremities: no edema Wound: incis ok  Lab Results: Recent Labs    02/08/21 0103 02/09/21 0101  WBC 6.1 8.3  HGB 7.5* 7.0*  HCT 23.9* 22.0*  PLT 204 228   BMET:  Recent Labs    02/08/21 0103 02/09/21 0101  NA 134* 133*  K 4.4 3.6  CL 100 98  CO2 27 26  GLUCOSE 142* 94  BUN 13 25*  CREATININE 1.29* 1.76*  CALCIUM 9.3 9.0    PT/INR: No results for input(s): LABPROT, INR in the last 72 hours. ABG    Component Value Date/Time   PHART 7.447 02/05/2021 1400   HCO3 24.6 02/05/2021 1400   O2SAT 96.4 02/05/2021 1400   CBG (last 3)  No results for input(s): GLUCAP in the last 72 hours.  Meds Scheduled Meds:  acetaminophen  1,000 mg Oral Q6H   Or   acetaminophen (TYLENOL) oral liquid 160 mg/5 mL  1,000 mg Oral Q6H   bisacodyl  10 mg Oral Daily   enoxaparin (LOVENOX) injection  40 mg Subcutaneous Daily   feeding supplement  237 mL Oral BID BM   folic acid  1 mg Oral Daily   ketorolac  15 mg Intravenous Q6H   multivitamin with minerals  1 tablet Oral Daily    senna-docusate  1 tablet Oral QHS   Continuous Infusions: PRN Meds:.morphine injection, ondansetron (ZOFRAN) IV, traMADol  Xrays DG Chest Port 1 View  Result Date: 02/08/2021 CLINICAL DATA:  Chest tube, status post pericardial window EXAM: PORTABLE CHEST 1 VIEW COMPARISON:  02/07/2021 FINDINGS: Small, residual right pneumothorax, no greater than 10% volume, diminished compared to prior examination. Left-sided chest tube remains in position, without significant left-sided pneumothorax. Subcutaneous emphysema about the right chest wall. The heart and mediastinum are unremarkable. IMPRESSION: 1. Small, residual right pneumothorax, no greater than 10% volume, diminished compared to prior examination. 2. Left-sided chest tube remains in position, without significant left-sided pneumothorax. Electronically Signed   By: Delanna Ahmadi M.D.   On: 02/08/2021 09:15   DG Chest Port 1 View  Result Date: 02/07/2021 CLINICAL DATA:  Post pericardial window creation. EXAM: PORTABLE CHEST 1 VIEW COMPARISON:  02/05/2021 FINDINGS: Heart size and pulmonary vascularity are normal. Interval placement of a left chest tube. Minimal left apical pneumothorax. Moderate right pneumothorax with partial atelectasis in the right lung. Subcutaneous emphysema along the right lateral chest wall. No right chest tube is identified. No pleural effusions. Mediastinal contours appear intact. IMPRESSION: Moderate right pneumothorax with atelectasis in the right lung and right chest wall emphysema. Tiny left  apical pneumothorax with left chest tube present. Critical Value/emergent results were called by telephone at the time of interpretation on 02/07/2021 at 7:54 pm to provider RN Jarrett Soho, who verbally acknowledged these results. Electronically Signed   By: Lucienne Capers M.D.   On: 02/07/2021 20:02    Assessment/Plan: S/P Procedure(s) (LRB): XI ROBOTIC ASSISTED THORACOSCOPY PERICARDIAL WINDOW (Left) THORACENTESIS (Right)  POD#2 1  afeb, VSS, SR/ST 2 sats good on RA 3 CT 110 cc - keep in place for now, no air leak 4 UOP not accurately recorded 5 creat has bumped to 1.76- will need to d/c toradol 6 path pending-  7 H/H close to transfusion threshold at 7/22- monitor closely 8 push nutrtion as able , albumin 1.8, getting supplements 9 LFT's normal 10 some trouble mobilizing secretions- add mucinex 11 CXR stable appearance   LOS: 2 days    Susan Holder 02/09/2021    Agree with above Will transfuse 1 unit Will remove chest tube after ambulation Home tomorrow  Lajuana Matte

## 2021-02-10 ENCOUNTER — Inpatient Hospital Stay (HOSPITAL_COMMUNITY): Payer: Medicare PPO

## 2021-02-10 LAB — TYPE AND SCREEN
ABO/RH(D): O POS
Antibody Screen: NEGATIVE
Unit division: 0

## 2021-02-10 LAB — CBC
HCT: 31.9 % — ABNORMAL LOW (ref 36.0–46.0)
Hemoglobin: 10.7 g/dL — ABNORMAL LOW (ref 12.0–15.0)
MCH: 33 pg (ref 26.0–34.0)
MCHC: 33.5 g/dL (ref 30.0–36.0)
MCV: 98.5 fL (ref 80.0–100.0)
Platelets: 265 10*3/uL (ref 150–400)
RBC: 3.24 MIL/uL — ABNORMAL LOW (ref 3.87–5.11)
RDW: 18.7 % — ABNORMAL HIGH (ref 11.5–15.5)
WBC: 8.6 10*3/uL (ref 4.0–10.5)
nRBC: 0.2 % (ref 0.0–0.2)

## 2021-02-10 LAB — BPAM RBC
Blood Product Expiration Date: 202211122359
ISSUE DATE / TIME: 202210150914
Unit Type and Rh: 5100

## 2021-02-10 LAB — BASIC METABOLIC PANEL
Anion gap: 9 (ref 5–15)
BUN: 26 mg/dL — ABNORMAL HIGH (ref 8–23)
CO2: 23 mmol/L (ref 22–32)
Calcium: 9.4 mg/dL (ref 8.9–10.3)
Chloride: 101 mmol/L (ref 98–111)
Creatinine, Ser: 1.4 mg/dL — ABNORMAL HIGH (ref 0.44–1.00)
GFR, Estimated: 41 mL/min — ABNORMAL LOW (ref >60)
Glucose, Bld: 87 mg/dL (ref 70–99)
Potassium: 3.7 mmol/L (ref 3.5–5.1)
Sodium: 133 mmol/L — ABNORMAL LOW (ref 135–145)

## 2021-02-10 MED ORDER — SODIUM CHLORIDE 0.9 % IV BOLUS
500.0000 mL | Freq: Once | INTRAVENOUS | Status: AC
  Administered 2021-02-10: 500 mL via INTRAVENOUS

## 2021-02-10 MED ORDER — SODIUM CHLORIDE 0.9 % IV SOLN: Freq: Once | INTRAVENOUS | Status: DC

## 2021-02-10 MED ORDER — TRAMADOL HCL 50 MG PO TABS: 50.0000 mg | ORAL_TABLET | Freq: Two times a day (BID) | ORAL | 0 refills | Status: AC | PRN

## 2021-02-10 NOTE — Progress Notes (Addendum)
[address]       [place] [address]             [phone]      3 Days Post-Op Procedure(s) (LRB): XI ROBOTIC ASSISTED THORACOSCOPY PERICARDIAL WINDOW (Left) THORACENTESIS (Right) Subjective:  Frequent diarrhea overnight, + tachycardia  Objective: Vital signs in last 24 hours: Temp:  [97.3 F (36.3 C)-98 F (36.7 C)] 97.6 F (36.4 C) (10/16 0510) Pulse Rate:  [98-111] 108 (10/16 0510) Cardiac Rhythm: Sinus tachycardia (10/15 1900) Resp:  [19-25] 20 (10/16 0510) BP: (102-129)/(60-69) 115/65 (10/16 0510) SpO2:  [97 %-98 %] 97 % (10/16 0510)  Hemodynamic parameters for last 24 hours:    Intake/Output from previous day: 10/15 0701 - 10/16 0700 In: 240 [P.O.:240] Out: 730 [Emesis/NG output:700; Chest Tube:30] Intake/Output this shift: No intake/output data recorded.  General appearance: alert, cooperative, and no distress Heart: regular rate and rhythm and tachy Lungs: clear except min dim in bases Abdomen: soft, non tender Extremities: no edema or calf tenderness Wound: incis healing well  Lab Results: Recent Labs    02/09/21 0101 02/09/21 1319 02/10/21 0118  WBC 8.3  --  8.6  HGB 7.0* 9.8* 10.7*  HCT 22.0* 29.5* 31.9*  PLT 228  --  265   BMET:  Recent Labs    02/09/21 0101 02/10/21 0118  NA 133* 133*  K 3.6 3.7  CL 98 101  CO2 26 23  GLUCOSE 94 87  BUN 25* 26*  CREATININE 1.76* 1.40*  CALCIUM 9.0 9.4    PT/INR: No results for input(s): LABPROT, INR in the last 72 hours. ABG    Component Value Date/Time   PHART 7.447 02/05/2021 1400   HCO3 24.6 02/05/2021 1400   O2SAT 96.4 02/05/2021 1400   CBG (last 3)  No results for input(s): GLUCAP in the last 72 hours.  Meds Scheduled Meds:  sodium chloride   Intravenous Once   acetaminophen  1,000 mg Oral Q6H   Or   acetaminophen (TYLENOL) oral liquid 160 mg/5 mL  1,000 mg Oral Q6H   bisacodyl  10 mg Oral Daily   dextromethorphan-guaiFENesin  1 tablet Oral BID    enoxaparin (LOVENOX) injection  30 mg Subcutaneous Daily   feeding supplement  237 mL Oral BID BM   folic acid  1 mg Oral Daily   multivitamin with minerals  1 tablet Oral Daily   senna-docusate  1 tablet Oral QHS   Continuous Infusions: PRN Meds:.morphine injection, ondansetron (ZOFRAN) IV, traMADol  Xrays DG Chest 1 View  Result Date: 02/10/2021 CLINICAL DATA:  Tachycardia, recent pericardial window and right-sided thoracentesis EXAM: PORTABLE CHEST 1 VIEW COMPARISON:  02/09/2021, CT from 01/07/2021 FINDINGS: Cardiac shadow is stable. Aortic calcifications are again seen. Minimal right-sided pneumothorax is seen stable in appearance. Small residual fluid is noted on the right. Linear areas of scarring are noted related to the known history of lung carcinoma and stable in appearance similar to that seen on prior CTs. Previously seen catheter over the left chest wall has been removed. Pneumothorax is seen. No bony abnormality is noted. IMPRESSION: Minimal residual right pneumothorax stable from the prior exam. Interval removal of right-sided chest tube. Chronic blunting of the right costophrenic angle. Electronically Signed   By: Inez Catalina M.D.   On: 02/10/2021 03:49   DG CHEST PORT 1 VIEW  Result Date: 02/09/2021 CLINICAL DATA:  Shortness of breath EXAM: PORTABLE CHEST 1 VIEW COMPARISON:  Chest radiograph 1 day prior  FINDINGS: A left-sided chest tube is in stable position. The cardiomediastinal silhouette is stable. There are small bilateral pleural effusions, right larger than left, with adjacent opacities likely reflecting subsegmental atelectasis. The small residual right pneumothorax is unchanged. There is no appreciable residual left pneumothorax. Subcutaneous emphysema in the right chest wall is unchanged. IMPRESSION: 1. Unchanged small residual right pneumothorax. Left chest tube in place with no appreciable left pneumothorax. 2. Unchanged small bilateral pleural effusions.  Electronically Signed   By: Valetta Mole M.D.   On: 02/09/2021 08:50    Assessment/Plan: S/P Procedure(s) (LRB): XI ROBOTIC ASSISTED THORACOSCOPY PERICARDIAL WINDOW (Left) THORACENTESIS (Right)  POD#3 1 afeb, VSS except sinus tachy at times to 120's 2 sats good on RA 3 CT is out 4 CXR-Minimal residual right pneumothorax stable from the prior exam.  Interval removal of right-sided chest tube.Chronic blunting of the right costophrenic angle 5 H/H improved after transfusion, not proportional for 1 unit of blood (high)  but 2 readings are similar post transfusion 6 creat improving following d/c of toradol 7 no leukocytosis 8 on lovenox for DVT PPX 9 diarrhea overnight, appears to have slowed some, d/c stool softeners and laxatives- she may be a little dehydrated with tachycardia- will have nursing record orthostatics 10 will discuss disposition with MD, poss home later today     LOS: 3 days    John Giovanni PA-C Pager [phone] 02/10/2021   Agree with above. The patient is tachycardic today after having several episodes of diarrhea and emesis.  She is adamant that she wants to go home.  She states that the smell of the food is likely what is making her sick.  We will give her 500 mL bolus of saline and check orthostatics.  We will plan for discharge today.  Kayden Hutmacher Bary Leriche

## 2021-02-10 NOTE — Progress Notes (Signed)
   02/10/21 0309  Assess: MEWS Score  Temp 97.6 F (36.4 C)  BP 117/67  Pulse Rate (!) 111  ECG Heart Rate (!) 111  Resp 20  SpO2 97 %  Assess: MEWS Score  MEWS Temp 0  MEWS Systolic 0  MEWS Pulse 2  MEWS RR 0  MEWS LOC 0  MEWS Score 2  MEWS Score Color Yellow  Assess: if the MEWS score is Yellow or Red  Were vital signs taken at a resting state? Yes  Focused Assessment Change from prior assessment (see assessment flowsheet)  Early Detection of Sepsis Score *See Row Information* Medium  MEWS guidelines implemented *See Row Information* Yes  Treat  MEWS Interventions Escalated (See documentation below)  Pain Scale 0-10  Pain Score 0  Take Vital Signs  Increase Vital Sign Frequency  Yellow: Q 2hr X 2 then Q 4hr X 2, if remains yellow, continue Q 4hrs  Escalate  MEWS: Escalate Yellow: discuss with charge nurse/RN and consider discussing with provider and RRT  Notify: Charge Nurse/RN  Name of Charge Nurse/RN Notified Shanon Ace, RN  Date Charge Nurse/RN Notified 02/10/21  Time Charge Nurse/RN Notified 0309

## 2021-02-10 NOTE — Plan of Care (Signed)
  Problem: Education: Goal: Knowledge of the prescribed therapeutic regimen will improve Outcome: Progressing   Problem: Activity: Goal: Risk for activity intolerance will decrease Outcome: Progressing   Problem: Skin Integrity: Goal: Wound healing without signs and symptoms infection will improve Outcome: Progressing   Problem: Education: Goal: Knowledge of General Education information will improve Description: Including pain rating scale, medication(s)/side effects and non-pharmacologic comfort measures Outcome: Progressing   Problem: Clinical Measurements: Goal: Will remain free from infection Outcome: Progressing   Problem: Activity: Goal: Risk for activity intolerance will decrease Outcome: Progressing   Problem: Nutrition: Goal: Adequate nutrition will be maintained Outcome: Progressing   Problem: Elimination: Goal: Will not experience complications related to urinary retention Outcome: Progressing   Problem: Safety: Goal: Ability to remain free from injury will improve Outcome: Progressing   Problem: Skin Integrity: Goal: Risk for impaired skin integrity will decrease Outcome: Progressing

## 2021-02-11 LAB — SURGICAL PATHOLOGY

## 2021-02-11 LAB — CYTOLOGY - NON PAP

## 2021-02-19 ENCOUNTER — Inpatient Hospital Stay: Payer: Medicare PPO | Admitting: Internal Medicine

## 2021-02-19 ENCOUNTER — Other Ambulatory Visit: Payer: Self-pay

## 2021-02-19 ENCOUNTER — Inpatient Hospital Stay: Payer: Medicare PPO

## 2021-02-19 ENCOUNTER — Encounter: Payer: Self-pay | Admitting: *Deleted

## 2021-02-19 ENCOUNTER — Encounter: Payer: Self-pay | Admitting: Internal Medicine

## 2021-02-19 VITALS — BP 140/76 | HR 110 | Temp 96.3°F | Resp 18 | Wt 104.3 lb

## 2021-02-19 DIAGNOSIS — C3431 Malignant neoplasm of lower lobe, right bronchus or lung: Secondary | ICD-10-CM

## 2021-02-19 DIAGNOSIS — I700 Atherosclerosis of aorta: Secondary | ICD-10-CM | POA: Diagnosis not present

## 2021-02-19 DIAGNOSIS — C3491 Malignant neoplasm of unspecified part of right bronchus or lung: Secondary | ICD-10-CM

## 2021-02-19 DIAGNOSIS — Z5112 Encounter for antineoplastic immunotherapy: Secondary | ICD-10-CM | POA: Diagnosis not present

## 2021-02-19 DIAGNOSIS — Z5111 Encounter for antineoplastic chemotherapy: Secondary | ICD-10-CM | POA: Diagnosis not present

## 2021-02-19 DIAGNOSIS — R5383 Other fatigue: Secondary | ICD-10-CM | POA: Diagnosis not present

## 2021-02-19 DIAGNOSIS — I3139 Other pericardial effusion (noninflammatory): Secondary | ICD-10-CM | POA: Diagnosis not present

## 2021-02-19 DIAGNOSIS — R634 Abnormal weight loss: Secondary | ICD-10-CM | POA: Diagnosis not present

## 2021-02-19 DIAGNOSIS — R110 Nausea: Secondary | ICD-10-CM | POA: Diagnosis not present

## 2021-02-19 DIAGNOSIS — J439 Emphysema, unspecified: Secondary | ICD-10-CM | POA: Diagnosis not present

## 2021-02-19 DIAGNOSIS — R000 Tachycardia, unspecified: Secondary | ICD-10-CM | POA: Diagnosis not present

## 2021-02-19 DIAGNOSIS — C7931 Secondary malignant neoplasm of brain: Secondary | ICD-10-CM | POA: Diagnosis not present

## 2021-02-19 DIAGNOSIS — Z79899 Other long term (current) drug therapy: Secondary | ICD-10-CM | POA: Diagnosis not present

## 2021-02-19 DIAGNOSIS — R059 Cough, unspecified: Secondary | ICD-10-CM | POA: Diagnosis not present

## 2021-02-19 LAB — CBC WITH DIFFERENTIAL (CANCER CENTER ONLY)
Abs Immature Granulocytes: 0.05 10*3/uL (ref 0.00–0.07)
Basophils Absolute: 0 10*3/uL (ref 0.0–0.1)
Basophils Relative: 0 %
Eosinophils Absolute: 0 10*3/uL (ref 0.0–0.5)
Eosinophils Relative: 0 %
HCT: 33.6 % — ABNORMAL LOW (ref 36.0–46.0)
Hemoglobin: 10.6 g/dL — ABNORMAL LOW (ref 12.0–15.0)
Immature Granulocytes: 1 %
Lymphocytes Relative: 10 %
Lymphs Abs: 1.1 10*3/uL (ref 0.7–4.0)
MCH: 32.4 pg (ref 26.0–34.0)
MCHC: 31.5 g/dL (ref 30.0–36.0)
MCV: 102.8 fL — ABNORMAL HIGH (ref 80.0–100.0)
Monocytes Absolute: 0.9 10*3/uL (ref 0.1–1.0)
Monocytes Relative: 8 %
Neutro Abs: 8.8 10*3/uL — ABNORMAL HIGH (ref 1.7–7.7)
Neutrophils Relative %: 81 %
Platelet Count: 456 10*3/uL — ABNORMAL HIGH (ref 150–400)
RBC: 3.27 MIL/uL — ABNORMAL LOW (ref 3.87–5.11)
RDW: 17.4 % — ABNORMAL HIGH (ref 11.5–15.5)
WBC Count: 10.9 10*3/uL — ABNORMAL HIGH (ref 4.0–10.5)
nRBC: 0 % (ref 0.0–0.2)

## 2021-02-19 LAB — CMP (CANCER CENTER ONLY)
ALT: 17 U/L (ref 0–44)
AST: 27 U/L (ref 15–41)
Albumin: 2.3 g/dL — ABNORMAL LOW (ref 3.5–5.0)
Alkaline Phosphatase: 171 U/L — ABNORMAL HIGH (ref 38–126)
Anion gap: 9 (ref 5–15)
BUN: 11 mg/dL (ref 8–23)
CO2: 25 mmol/L (ref 22–32)
Calcium: 10 mg/dL (ref 8.9–10.3)
Chloride: 106 mmol/L (ref 98–111)
Creatinine: 1.13 mg/dL — ABNORMAL HIGH (ref 0.44–1.00)
GFR, Estimated: 53 mL/min — ABNORMAL LOW (ref >60)
Glucose, Bld: 95 mg/dL (ref 70–99)
Potassium: 4.1 mmol/L (ref 3.5–5.1)
Sodium: 140 mmol/L (ref 135–145)
Total Bilirubin: 0.3 mg/dL (ref 0.3–1.2)
Total Protein: 7.2 g/dL (ref 6.5–8.1)

## 2021-02-19 LAB — TSH: TSH: 2.354 u[IU]/mL (ref 0.308–3.960)

## 2021-02-19 MED ORDER — SODIUM CHLORIDE 0.9 % IV SOLN
375.0000 mg/m2 | Freq: Once | INTRAVENOUS | Status: AC
  Administered 2021-02-19: 600 mg via INTRAVENOUS
  Filled 2021-02-19: qty 4

## 2021-02-19 MED ORDER — SODIUM CHLORIDE 0.9 % IV SOLN: Freq: Once | INTRAVENOUS | Status: AC

## 2021-02-19 MED ORDER — PEMBROLIZUMAB CHEMO INJECTION 100 MG/4ML
200.0000 mg | Freq: Once | INTRAVENOUS | Status: AC
Start: 2021-02-19 — End: 2021-02-19
  Administered 2021-02-19: 200 mg via INTRAVENOUS
  Filled 2021-02-19: qty 8

## 2021-02-19 MED ORDER — PROCHLORPERAZINE MALEATE 10 MG PO TABS
10.0000 mg | ORAL_TABLET | Freq: Once | ORAL | Status: AC
  Administered 2021-02-19: 10 mg via ORAL
  Filled 2021-02-19: qty 1

## 2021-02-19 NOTE — Progress Notes (Signed)
Per Dr. Julien Nordmann, it is okay to treat pt today with Alimta and Keytruda today with pulse rate of 110.

## 2021-02-19 NOTE — Patient Instructions (Signed)
[facility]  Discharge Instructions: Thank you for choosing [facility] to provide your oncology and hematology care.   If you have a lab appointment with the [facility], please go directly to the [facility] and check in at the registration area.   Wear comfortable clothing and clothing appropriate for easy access to any Portacath or PICC line.   We strive to give you quality time with your provider. You may need to reschedule your appointment if you arrive late (15 or more minutes).  Arriving late affects you and other patients whose appointments are after yours.  Also, if you miss three or more appointments without notifying the office, you may be dismissed from the clinic at the provider's discretion.      For prescription refill requests, have your pharmacy contact our office and allow 72 hours for refills to be completed.    Today you received the following chemotherapy and/or immunotherapy agents keytruda alimta      To help prevent nausea and vomiting after your treatment, we encourage you to take your nausea medication as directed.  BELOW ARE SYMPTOMS THAT SHOULD BE REPORTED IMMEDIATELY: *FEVER GREATER THAN 100.4 F (38 C) OR HIGHER *CHILLS OR SWEATING *NAUSEA AND VOMITING THAT IS NOT CONTROLLED WITH YOUR NAUSEA MEDICATION *UNUSUAL SHORTNESS OF BREATH *UNUSUAL BRUISING OR BLEEDING *URINARY PROBLEMS (pain or burning when urinating, or frequent urination) *BOWEL PROBLEMS (unusual diarrhea, constipation, pain near the anus) TENDERNESS IN MOUTH AND THROAT WITH OR WITHOUT PRESENCE OF ULCERS (sore throat, sores in mouth, or a toothache) UNUSUAL RASH, SWELLING OR PAIN  UNUSUAL VAGINAL DISCHARGE OR ITCHING   Items with * indicate a potential emergency and should be followed up as soon as possible or go to the Emergency Department if any problems should occur.  Please show the CHEMOTHERAPY ALERT CARD or IMMUNOTHERAPY ALERT CARD at  check-in to the Emergency Department and triage nurse.  Should you have questions after your visit or need to cancel or reschedule your appointment, please contact [facility]  Dept: [phone]  and follow the prompts.  Office hours are 8:00 a.m. to 4:30 p.m. Monday - Friday. Please note that voicemails left after 4:00 p.m. may not be returned until the following business day.  We are closed weekends and major holidays. You have access to a nurse at all times for urgent questions. Please call the main number to the clinic Dept: [phone] and follow the prompts.   For any non-urgent questions, you may also contact your provider using MyChart. We now offer e-Visits for anyone 56 and older to request care online for non-urgent symptoms. For details visit [email or web address].   Also download the MyChart app! Go to the app store, search "MyChart", open the app, select [facility], and log in with your MyChart username and password.  Due to Covid, a mask is required upon entering the hospital/clinic. If you do not have a mask, one will be given to you upon arrival. For doctor visits, patients may have 1 support person aged 60 or older with them. For treatment visits, patients cannot have anyone with them due to current Covid guidelines and our immunocompromised population.

## 2021-02-19 NOTE — Progress Notes (Signed)
[facility] [phone]   [phone]  OFFICE PROGRESS NOTE  [name], DO No address on file  DIAGNOSIS: Metastatic non-small cell lung cancer initially diagnosed as stage IIIA (T3, N1/N2, M0) non-small cell lung cancer, adenocarcinoma presented with large right lower lobe lung mass with extension to the right hilum and subcarinal area diagnosed in July 2019.  She has brain metastasis in October 2020.  Biomarker Findings Tumor Mutational Burden - TMB-Intermediate (6 Muts/Mb) Microsatellite status - MS-Stable Genomic Findings For a complete list of the genes assayed, please refer to the Appendix. NRAS Q61R ARAF amplification STK11 G56W KRAS G13D MYCN amplification MCL1 amplification NKX2-1 amplification - equivocal? TP53 G245V 7 Disease relevant genes with no reportable alterations: EGFR, ALK, BRAF, MET, ERBB2, RET, ROS1   PRIOR THERAPY:  1) Course of concurrent chemoradiation with weekly carboplatin for AUC of 2 and paclitaxel 45 mg/M2.  Status post 7 cycles.  Last dose was giving 01/11/2018. 2) Consolidation treatment with immunotherapy with Imfinzi (Durvalumab) 10 mg/KG every 2 weeks.  First dose February 09, 2018.  Status post 19 cycles. 3) status post stereotactic body radiotherapy to the enlarging right supraclavicular lymphadenopathy under the care of Dr. Lisbeth Renshaw. 4) SRS to multiple brain metastasis under the care of Dr. Lisbeth Renshaw.  CURRENT THERAPY: Systemic chemotherapy with carboplatin for AUC of 5, Alimta 500 mg/M2 and Keytruda 200 mg IV every 3 weeks.  First dose 08/16/2019.  Status post 26 cycles.  Starting from cycle #5 the patient is on maintenance treatment with Alimta and Keytruda every 3 weeks.  INTERVAL HISTORY: Susan Holder 65 y.o. female returns to the clinic today for follow-up visit.  The patient is feeling fine except for the lack of appetite and she lost several pounds recently.  She use Ensure at regular basis.   She denied having any current chest pain, shortness of breath, cough or hemoptysis.  She denied having any fever or chills.  She has no nausea, vomiting, diarrhea or constipation.  On February 07, 2021 she underwent pericardial window as well as a right pleurodesis under the care of Dr. Kipp Brood and the final cytology was negative for malignancy.  The patient denied having any recent headache or visual changes.  She is here today for evaluation before starting cycle #27 of her treatment.  MEDICAL HISTORY: Past Medical History:  Diagnosis Date   Acid reflux 09/21/2018   Adenocarcinoma of right lung, stage 4 ([facility]) 11/17/2017   Allergic rhinitis 08/22/2013   ANA positive 05/29/2016   Aortic atherosclerosis ([facility]) 10/23/2020   Brain metastases ([facility]) 03/29/2019   Coronary artery calcification seen on CT scan 10/23/2020   DDD (degenerative disc disease), cervical 05/29/2016   Encounter for antineoplastic chemotherapy 11/17/2017   Encounter for antineoplastic immunotherapy 02/01/2018   Goals of care, counseling/discussion 11/17/2017   High risk medication use 05/29/2016   04/29/2016: ==> plq 200 am & 100 qhs(adeq response).   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[facility]) 11/04/2018   Other fatigue 05/29/2016   Pericardial effusion 10/23/2020   Primary malignant neoplasm of bronchus of right lower lobe ([facility]) 12/21/2017   Rheumatoid arthritis ([facility])    Rheumatoid arthritis involving multiple sites with positive rheumatoid factor ([facility]) 05/29/2016   +RF +ANA +CCP    S/P  pericardial window creation 02/07/2021   Smoker 05/29/2016   Trigger finger, left ring finger 05/29/2016   Trigger finger, right ring finger 05/29/2016   Vitamin D deficiency 05/29/2016    ALLERGIES:  has No Known Allergies.  MEDICATIONS:  Current Outpatient Medications  Medication Sig Dispense  Refill   folic acid (FOLVITE) 1 MG tablet TAKE 1 TABLET(1 MG) BY MOUTH DAILY 30 tablet 4   hydroxychloroquine (PLAQUENIL) 200 MG tablet TAKE 1 TABLET BY MOUTH TWICE DAILY MONDAY THROUGH FRIDAY ONLY. 120 tablet 0   omeprazole (PRILOSEC) 20 MG capsule Take 20 mg by mouth daily.     prochlorperazine (COMPAZINE) 10 MG tablet TAKE 1 TABLET(10 MG) BY MOUTH EVERY 6 HOURS AS NEEDED FOR NAUSEA OR VOMITING 30 tablet 2   vitamin C (ASCORBIC ACID) 500 MG tablet Take 500 mg by mouth daily.     VITAMIN D PO Take 1 tablet by mouth daily.     No current facility-administered medications for this visit.    SURGICAL HISTORY:  Past Surgical History:  Procedure Laterality Date   BRONCHIAL NEEDLE ASPIRATION BIOPSY  11/09/2017   Procedure: BRONCHIAL NEEDLE ASPIRATION BIOPSIES;  Surgeon: Marshell Garfinkel, MD;  Location: [facility];  Service: Cardiopulmonary;;   ENDOBRONCHIAL ULTRASOUND Bilateral 11/09/2017   Procedure: ENDOBRONCHIAL ULTRASOUND;  Surgeon: Marshell Garfinkel, MD;  Location: [facility];  Service: Cardiopulmonary;  Laterality: Bilateral;   NASAL SINUS SURGERY     NASAL SINUS SURGERY     THORACENTESIS Right 02/07/2021   Procedure: THORACENTESIS;  Surgeon: Lajuana Matte, MD;  Location: [facility];  Service: Thoracic;  Laterality: Right;   TUBAL LIGATION     VIDEO BRONCHOSCOPY  11/09/2017   Procedure: VIDEO BRONCHOSCOPY;  Surgeon: Marshell Garfinkel, MD;  Location: [facility];  Service: Cardiopulmonary;;   XI ROBOTIC ASSISTED PERICARDIAL WINDOW Left 02/07/2021   Procedure: XI ROBOTIC ASSISTED THORACOSCOPY PERICARDIAL WINDOW;  Surgeon: Lajuana Matte, MD;  Location: [facility];  Service: Thoracic;  Laterality: Left;    REVIEW OF SYSTEMS:  A comprehensive review of systems was negative except for: Constitutional: positive for fatigue and weight loss Cardiovascular: positive for tachycardia    PHYSICAL EXAMINATION: General appearance: alert, cooperative, fatigued, and no distress Head: Normocephalic,  without obvious abnormality, atraumatic Neck: no adenopathy, no JVD, supple, symmetrical, trachea midline, and thyroid not enlarged, symmetric, no tenderness/mass/nodules Lymph nodes: Cervical, supraclavicular, and axillary nodes normal. Resp: clear to auscultation bilaterally Back: symmetric, no curvature. ROM normal. No CVA tenderness. Cardio: regular rate and rhythm, S1, S2 normal, no murmur, click, rub or gallop GI: soft, non-tender; bowel sounds normal; no masses,  no organomegaly Extremities: extremities normal, atraumatic, no cyanosis or edema  ECOG PERFORMANCE STATUS: 1 - Symptomatic but completely ambulatory  Blood pressure 140/76, pulse (!) 110, temperature (!) 96.3 F (35.7 C), temperature source Tympanic, resp. rate 18, weight 104 lb 4.8 oz (47.3 kg), last menstrual period 04/28/2000, SpO2 97 %.   LABORATORY DATA: Lab Results  Component Value Date   WBC 8.6 02/10/2021   HGB 10.7 (L) 02/10/2021   HCT 31.9 (L) 02/10/2021   MCV 98.5 02/10/2021   PLT 265 02/10/2021      Chemistry      Component Value Date/Time   NA 133 (L) 02/10/2021 0118   K 3.7 02/10/2021 0118   CL 101 02/10/2021 0118   CO2 23 02/10/2021 0118   BUN 26 (H) 02/10/2021 0118   CREATININE 1.40 (H) 02/10/2021 0118   CREATININE 1.21 (H) 01/30/2021 0912   CREATININE 0.80 07/24/2017  1438      Component Value Date/Time   CALCIUM 9.4 02/10/2021 0118   ALKPHOS 65 02/09/2021 0101   AST 32 02/09/2021 0101   AST 31 01/30/2021 0912   ALT 8 02/09/2021 0101   ALT 9 01/30/2021 0912   BILITOT 0.4 02/09/2021 0101   BILITOT <0.2 (L) 01/30/2021 0912       RADIOGRAPHIC STUDIES: DG Chest 1 View  Result Date: 02/10/2021 CLINICAL DATA:  Tachycardia, recent pericardial window and right-sided thoracentesis EXAM: PORTABLE CHEST 1 VIEW COMPARISON:  02/09/2021, CT from 01/07/2021 FINDINGS: Cardiac shadow is stable. Aortic calcifications are again seen. Minimal right-sided pneumothorax is seen stable in appearance.  Small residual fluid is noted on the right. Linear areas of scarring are noted related to the known history of lung carcinoma and stable in appearance similar to that seen on prior CTs. Previously seen catheter over the left chest wall has been removed. Pneumothorax is seen. No bony abnormality is noted. IMPRESSION: Minimal residual right pneumothorax stable from the prior exam. Interval removal of right-sided chest tube. Chronic blunting of the right costophrenic angle. Electronically Signed   By: Inez Catalina M.D.   On: 02/10/2021 03:49   DG Chest 2 View  Result Date: 02/06/2021 CLINICAL DATA:  Preoperative respiratory evaluation. EXAM: CHEST - 2 VIEW COMPARISON:  Chest CT 01/07/2021 FINDINGS: Small left and moderate right pleural effusions with persistent soft tissue fullness in the right hilum. Right parahilar atelectasis or scarring evident. No pulmonary edema. The cardiopericardial silhouette is within normal limits for size. The visualized bony structures of the thorax show no acute abnormality. IMPRESSION: Small left and moderate right pleural effusions with soft tissue fullness in the right hilum and right parahilar atelectasis or scarring. Electronically Signed   By: Misty Stanley M.D.   On: 02/06/2021 08:13   DG CHEST PORT 1 VIEW  Result Date: 02/09/2021 CLINICAL DATA:  Shortness of breath EXAM: PORTABLE CHEST 1 VIEW COMPARISON:  Chest radiograph 1 day prior FINDINGS: A left-sided chest tube is in stable position. The cardiomediastinal silhouette is stable. There are small bilateral pleural effusions, right larger than left, with adjacent opacities likely reflecting subsegmental atelectasis. The small residual right pneumothorax is unchanged. There is no appreciable residual left pneumothorax. Subcutaneous emphysema in the right chest wall is unchanged. IMPRESSION: 1. Unchanged small residual right pneumothorax. Left chest tube in place with no appreciable left pneumothorax. 2. Unchanged small  bilateral pleural effusions. Electronically Signed   By: Valetta Mole M.D.   On: 02/09/2021 08:50   DG Chest Port 1 View  Result Date: 02/08/2021 CLINICAL DATA:  Chest tube, status post pericardial window EXAM: PORTABLE CHEST 1 VIEW COMPARISON:  02/07/2021 FINDINGS: Small, residual right pneumothorax, no greater than 10% volume, diminished compared to prior examination. Left-sided chest tube remains in position, without significant left-sided pneumothorax. Subcutaneous emphysema about the right chest wall. The heart and mediastinum are unremarkable. IMPRESSION: 1. Small, residual right pneumothorax, no greater than 10% volume, diminished compared to prior examination. 2. Left-sided chest tube remains in position, without significant left-sided pneumothorax. Electronically Signed   By: Delanna Ahmadi M.D.   On: 02/08/2021 09:15   DG Chest Port 1 View  Result Date: 02/07/2021 CLINICAL DATA:  Post pericardial window creation. EXAM: PORTABLE CHEST 1 VIEW COMPARISON:  02/05/2021 FINDINGS: Heart size and pulmonary vascularity are normal. Interval placement of a left chest tube. Minimal left apical pneumothorax. Moderate right pneumothorax with partial atelectasis in the right lung. Subcutaneous emphysema along the right lateral  chest wall. No right chest tube is identified. No pleural effusions. Mediastinal contours appear intact. IMPRESSION: Moderate right pneumothorax with atelectasis in the right lung and right chest wall emphysema. Tiny left apical pneumothorax with left chest tube present. Critical Value/emergent results were called by telephone at the time of interpretation on 02/07/2021 at 7:54 pm to provider RN Jarrett Soho, who verbally acknowledged these results. Electronically Signed   By: Lucienne Capers M.D.   On: 02/07/2021 20:02    ASSESSMENT AND PLAN: This is a very pleasant 65 years old African-American female with metastatic non-small cell lung cancer initially diagnosed with a stage IIIA non-small  cell lung cancer, adenocarcinoma.  She underwent a course of concurrent chemoradiation with weekly carboplatin and paclitaxel status post 7 cycles with partial response.   The patient tolerated this course of treatment well except for mild odynophagia and dysphagia. She completed on consolidation treatment with immunotherapy with Imfinzi (Durvalumab) status post 18 cycles. She also completed SBRT to the right supraclavicular lymphadenopathy. The patient had evidence for multiple brain metastasis in October 2020 and she underwent SRS treatment to this lesion under the care of Dr. Lisbeth Renshaw. The patient had evidence for disease progression and she started systemic chemotherapy with carboplatin, Alimta and Keytruda status post 26 cycles.  Starting from cycle #5 she is on maintenance treatment with Alimta and Keytruda every 3 weeks. The patient has been tolerating her maintenance treatment fairly well with no concerning adverse effects. She recently underwent pericardial window as well as right pleurodesis under the care of Dr. Kipp Brood and the final cytology was negative for malignancy. I recommended for the patient to continue her current treatment with maintenance Alimta and Keytruda every 3 weeks and she will proceed with cycle #27 today. For the weight loss she is followed by nutrition and she was encouraged to increase her oral intake. I will see her back for follow-up visit in 3 weeks for evaluation before starting cycle #28. The patient was advised to call immediately if she has any other concerning symptoms in the interval. The patient voices understanding of current disease status and treatment options and is in agreement with the current care plan. All questions were answered. The patient knows to call the clinic with any problems, questions or concerns. We can certainly see the patient much sooner if necessary.  Disclaimer: This note was dictated with voice recognition software. Similar sounding  words can inadvertently be transcribed and may not be corrected upon review.

## 2021-02-19 NOTE — Progress Notes (Signed)
I spoke to Susan Holder today.  I noticed she has lost weight.  She states that yes she has lost weight. I asked if she would like me to have dietitian to contact her for assistance to increase her caloric intact. She states not at this time.

## 2021-02-22 ENCOUNTER — Other Ambulatory Visit: Payer: Self-pay

## 2021-02-22 ENCOUNTER — Ambulatory Visit: Payer: Self-pay | Admitting: Thoracic Surgery (Cardiothoracic Vascular Surgery)

## 2021-02-22 ENCOUNTER — Ambulatory Visit (INDEPENDENT_AMBULATORY_CARE_PROVIDER_SITE_OTHER): Payer: Self-pay | Admitting: Thoracic Surgery (Cardiothoracic Vascular Surgery)

## 2021-02-22 VITALS — BP 134/75 | HR 118 | Resp 20 | Ht 60.0 in | Wt 105.0 lb

## 2021-02-22 DIAGNOSIS — I3139 Other pericardial effusion (noninflammatory): Secondary | ICD-10-CM

## 2021-02-22 DIAGNOSIS — Z09 Encounter for follow-up examination after completed treatment for conditions other than malignant neoplasm: Secondary | ICD-10-CM

## 2021-02-22 NOTE — Progress Notes (Signed)
      [address]       [place] [address]             [phone]        Marlaine C Bufford [facility] Medical Record #[id number] Date of Birth: 05-29-1955  Referring: Revankar, Reita Cliche, MD Primary Care: [name], DO Primary Cardiologist:None  Reason for visit:   follow-up  History of Present Illness:     Hasley comes in for she states that she feels like she has much more energy and is much less short of breath.  She does remain tachycardic  Physical Exam: BP 134/75   Pulse (!) 118   Resp 20   Ht 5' (1.524 m)   Wt 105 lb (47.6 kg)   LMP 04/28/2000   SpO2 93% Comment: RA  BMI 20.51 kg/m   Alert NAD Incision clean, healing well.   Abdomen soft, ND No peripheral edema   Diagnostic Studies & Laboratory data:      Assessment / Plan:   65 year old female status post pericardial window.  Pathology has been negative.  Symptomatically she feels better however she remains tachycardic.  I will see her back in 1 month with a chest x-ray.   Lajuana Matte 02/22/2021 5:09 PM

## 2021-02-25 ENCOUNTER — Ambulatory Visit: Payer: Medicare PPO | Admitting: Cardiology

## 2021-02-25 ENCOUNTER — Telehealth: Payer: Self-pay

## 2021-02-25 NOTE — Telephone Encounter (Signed)
Pt called requesting a refill of Compazine. I reviewed pts records and found that rx was sent 01/15/21 with two refills. I conferenced pt in with [facility] who confirmed her rx is there.

## 2021-03-01 ENCOUNTER — Ambulatory Visit
Admission: RE | Admit: 2021-03-01 | Discharge: 2021-03-01 | Disposition: A | Discharge status: Home / Self Care | Payer: Medicare PPO | Source: Ambulatory Visit | Attending: Radiation Oncology | Admitting: Radiation Oncology

## 2021-03-01 DIAGNOSIS — C7949 Secondary malignant neoplasm of other parts of nervous system: Secondary | ICD-10-CM

## 2021-03-01 DIAGNOSIS — C7931 Secondary malignant neoplasm of brain: Secondary | ICD-10-CM

## 2021-03-01 DIAGNOSIS — C719 Malignant neoplasm of brain, unspecified: Secondary | ICD-10-CM | POA: Diagnosis not present

## 2021-03-01 MED ORDER — GADOBENATE DIMEGLUMINE 529 MG/ML IV SOLN
9.0000 mL | Freq: Once | INTRAVENOUS | Status: AC | PRN
  Administered 2021-03-01: 9 mL via INTRAVENOUS

## 2021-03-04 ENCOUNTER — Ambulatory Visit: Payer: Self-pay | Admitting: Radiation Oncology

## 2021-03-04 ENCOUNTER — Inpatient Hospital Stay: Payer: Medicare PPO | Attending: Internal Medicine

## 2021-03-04 DIAGNOSIS — M069 Rheumatoid arthritis, unspecified: Secondary | ICD-10-CM | POA: Insufficient documentation

## 2021-03-04 DIAGNOSIS — Z5112 Encounter for antineoplastic immunotherapy: Secondary | ICD-10-CM | POA: Insufficient documentation

## 2021-03-04 DIAGNOSIS — Z79899 Other long term (current) drug therapy: Secondary | ICD-10-CM | POA: Insufficient documentation

## 2021-03-04 DIAGNOSIS — Z5111 Encounter for antineoplastic chemotherapy: Secondary | ICD-10-CM | POA: Insufficient documentation

## 2021-03-04 DIAGNOSIS — C770 Secondary and unspecified malignant neoplasm of lymph nodes of head, face and neck: Secondary | ICD-10-CM | POA: Insufficient documentation

## 2021-03-04 DIAGNOSIS — I700 Atherosclerosis of aorta: Secondary | ICD-10-CM | POA: Insufficient documentation

## 2021-03-04 DIAGNOSIS — I2510 Atherosclerotic heart disease of native coronary artery without angina pectoris: Secondary | ICD-10-CM | POA: Insufficient documentation

## 2021-03-04 DIAGNOSIS — C3431 Malignant neoplasm of lower lobe, right bronchus or lung: Secondary | ICD-10-CM | POA: Insufficient documentation

## 2021-03-04 DIAGNOSIS — C7931 Secondary malignant neoplasm of brain: Secondary | ICD-10-CM | POA: Insufficient documentation

## 2021-03-11 ENCOUNTER — Encounter: Payer: Self-pay | Admitting: Physician Assistant

## 2021-03-11 DIAGNOSIS — H2513 Age-related nuclear cataract, bilateral: Secondary | ICD-10-CM | POA: Diagnosis not present

## 2021-03-11 DIAGNOSIS — H524 Presbyopia: Secondary | ICD-10-CM | POA: Diagnosis not present

## 2021-03-11 DIAGNOSIS — H5213 Myopia, bilateral: Secondary | ICD-10-CM | POA: Diagnosis not present

## 2021-03-11 DIAGNOSIS — Z79899 Other long term (current) drug therapy: Secondary | ICD-10-CM | POA: Diagnosis not present

## 2021-03-12 ENCOUNTER — Inpatient Hospital Stay: Payer: Medicare PPO

## 2021-03-12 ENCOUNTER — Other Ambulatory Visit: Payer: Self-pay

## 2021-03-12 ENCOUNTER — Encounter: Payer: Self-pay | Admitting: Internal Medicine

## 2021-03-12 ENCOUNTER — Other Ambulatory Visit: Payer: Self-pay | Admitting: Internal Medicine

## 2021-03-12 ENCOUNTER — Inpatient Hospital Stay: Payer: Medicare PPO | Admitting: Internal Medicine

## 2021-03-12 VITALS — BP 139/70 | HR 101 | Temp 97.8°F | Resp 17 | Ht 60.0 in | Wt 105.3 lb

## 2021-03-12 DIAGNOSIS — Z5111 Encounter for antineoplastic chemotherapy: Secondary | ICD-10-CM | POA: Diagnosis not present

## 2021-03-12 DIAGNOSIS — C7931 Secondary malignant neoplasm of brain: Secondary | ICD-10-CM | POA: Diagnosis not present

## 2021-03-12 DIAGNOSIS — C3431 Malignant neoplasm of lower lobe, right bronchus or lung: Secondary | ICD-10-CM | POA: Diagnosis not present

## 2021-03-12 DIAGNOSIS — Z5112 Encounter for antineoplastic immunotherapy: Secondary | ICD-10-CM | POA: Diagnosis not present

## 2021-03-12 DIAGNOSIS — Z79899 Other long term (current) drug therapy: Secondary | ICD-10-CM | POA: Diagnosis not present

## 2021-03-12 DIAGNOSIS — C3491 Malignant neoplasm of unspecified part of right bronchus or lung: Secondary | ICD-10-CM

## 2021-03-12 DIAGNOSIS — C770 Secondary and unspecified malignant neoplasm of lymph nodes of head, face and neck: Secondary | ICD-10-CM | POA: Diagnosis not present

## 2021-03-12 DIAGNOSIS — M069 Rheumatoid arthritis, unspecified: Secondary | ICD-10-CM | POA: Diagnosis not present

## 2021-03-12 DIAGNOSIS — I700 Atherosclerosis of aorta: Secondary | ICD-10-CM | POA: Diagnosis not present

## 2021-03-12 DIAGNOSIS — I2510 Atherosclerotic heart disease of native coronary artery without angina pectoris: Secondary | ICD-10-CM | POA: Diagnosis not present

## 2021-03-12 LAB — CBC WITH DIFFERENTIAL (CANCER CENTER ONLY)
Abs Immature Granulocytes: 0.06 10*3/uL (ref 0.00–0.07)
Basophils Absolute: 0 10*3/uL (ref 0.0–0.1)
Basophils Relative: 0 %
Eosinophils Absolute: 0 10*3/uL (ref 0.0–0.5)
Eosinophils Relative: 0 %
HCT: 31.3 % — ABNORMAL LOW (ref 36.0–46.0)
Hemoglobin: 9.9 g/dL — ABNORMAL LOW (ref 12.0–15.0)
Immature Granulocytes: 1 %
Lymphocytes Relative: 10 %
Lymphs Abs: 1 10*3/uL (ref 0.7–4.0)
MCH: 33.7 pg (ref 26.0–34.0)
MCHC: 31.6 g/dL (ref 30.0–36.0)
MCV: 106.5 fL — ABNORMAL HIGH (ref 80.0–100.0)
Monocytes Absolute: 0.9 10*3/uL (ref 0.1–1.0)
Monocytes Relative: 9 %
Neutro Abs: 8.1 10*3/uL — ABNORMAL HIGH (ref 1.7–7.7)
Neutrophils Relative %: 80 %
Platelet Count: 520 10*3/uL — ABNORMAL HIGH (ref 150–400)
RBC: 2.94 MIL/uL — ABNORMAL LOW (ref 3.87–5.11)
RDW: 18.7 % — ABNORMAL HIGH (ref 11.5–15.5)
WBC Count: 10 10*3/uL (ref 4.0–10.5)
nRBC: 0 % (ref 0.0–0.2)

## 2021-03-12 LAB — CMP (CANCER CENTER ONLY)
ALT: 9 U/L (ref 0–44)
AST: 25 U/L (ref 15–41)
Albumin: 2.4 g/dL — ABNORMAL LOW (ref 3.5–5.0)
Alkaline Phosphatase: 97 U/L (ref 38–126)
Anion gap: 12 (ref 5–15)
BUN: 13 mg/dL (ref 8–23)
CO2: 24 mmol/L (ref 22–32)
Calcium: 10.1 mg/dL (ref 8.9–10.3)
Chloride: 103 mmol/L (ref 98–111)
Creatinine: 1.15 mg/dL — ABNORMAL HIGH (ref 0.44–1.00)
GFR, Estimated: 52 mL/min — ABNORMAL LOW (ref >60)
Glucose, Bld: 89 mg/dL (ref 70–99)
Potassium: 3.7 mmol/L (ref 3.5–5.1)
Sodium: 139 mmol/L (ref 135–145)
Total Bilirubin: <0.2 mg/dL — ABNORMAL LOW (ref 0.3–1.2)
Total Protein: 7.5 g/dL (ref 6.5–8.1)

## 2021-03-12 LAB — TSH: TSH: 2.097 u[IU]/mL (ref 0.308–3.960)

## 2021-03-12 MED ORDER — SODIUM CHLORIDE 0.9 % IV SOLN
200.0000 mg | Freq: Once | INTRAVENOUS | Status: AC
  Administered 2021-03-12: 200 mg via INTRAVENOUS
  Filled 2021-03-12: qty 8

## 2021-03-12 MED ORDER — PROCHLORPERAZINE MALEATE 10 MG PO TABS
10.0000 mg | ORAL_TABLET | Freq: Once | ORAL | Status: AC
  Administered 2021-03-12: 10 mg via ORAL
  Filled 2021-03-12: qty 1

## 2021-03-12 MED ORDER — SODIUM CHLORIDE 0.9 % IV SOLN: Freq: Once | INTRAVENOUS | Status: AC

## 2021-03-12 MED ORDER — SODIUM CHLORIDE 0.9 % IV SOLN
375.0000 mg/m2 | Freq: Once | INTRAVENOUS | Status: AC
  Administered 2021-03-12: 600 mg via INTRAVENOUS
  Filled 2021-03-12: qty 4

## 2021-03-12 NOTE — Patient Instructions (Signed)
[facility]  Discharge Instructions: Thank you for choosing [facility] to provide your oncology and hematology care.   If you have a lab appointment with the [facility], please go directly to the [facility] and check in at the registration area.   Wear comfortable clothing and clothing appropriate for easy access to any Portacath or PICC line.   We strive to give you quality time with your provider. You may need to reschedule your appointment if you arrive late (15 or more minutes).  Arriving late affects you and other patients whose appointments are after yours.  Also, if you miss three or more appointments without notifying the office, you may be dismissed from the clinic at the provider's discretion.      For prescription refill requests, have your pharmacy contact our office and allow 72 hours for refills to be completed.    Today you received the following chemotherapy and/or immunotherapy agents keytruda alimta      To help prevent nausea and vomiting after your treatment, we encourage you to take your nausea medication as directed.  BELOW ARE SYMPTOMS THAT SHOULD BE REPORTED IMMEDIATELY: *FEVER GREATER THAN 100.4 F (38 C) OR HIGHER *CHILLS OR SWEATING *NAUSEA AND VOMITING THAT IS NOT CONTROLLED WITH YOUR NAUSEA MEDICATION *UNUSUAL SHORTNESS OF BREATH *UNUSUAL BRUISING OR BLEEDING *URINARY PROBLEMS (pain or burning when urinating, or frequent urination) *BOWEL PROBLEMS (unusual diarrhea, constipation, pain near the anus) TENDERNESS IN MOUTH AND THROAT WITH OR WITHOUT PRESENCE OF ULCERS (sore throat, sores in mouth, or a toothache) UNUSUAL RASH, SWELLING OR PAIN  UNUSUAL VAGINAL DISCHARGE OR ITCHING   Items with * indicate a potential emergency and should be followed up as soon as possible or go to the Emergency Department if any problems should occur.  Please show the CHEMOTHERAPY ALERT CARD or IMMUNOTHERAPY ALERT CARD at  check-in to the Emergency Department and triage nurse.  Should you have questions after your visit or need to cancel or reschedule your appointment, please contact [facility]  Dept: [phone]  and follow the prompts.  Office hours are 8:00 a.m. to 4:30 p.m. Monday - Friday. Please note that voicemails left after 4:00 p.m. may not be returned until the following business day.  We are closed weekends and major holidays. You have access to a nurse at all times for urgent questions. Please call the main number to the clinic Dept: [phone] and follow the prompts.   For any non-urgent questions, you may also contact your provider using MyChart. We now offer e-Visits for anyone 22 and older to request care online for non-urgent symptoms. For details visit [email or web address].   Also download the MyChart app! Go to the app store, search "MyChart", open the app, select [facility], and log in with your MyChart username and password.  Due to Covid, a mask is required upon entering the hospital/clinic. If you do not have a mask, one will be given to you upon arrival. For doctor visits, patients may have 1 support person aged 65 or older with them. For treatment visits, patients cannot have anyone with them due to current Covid guidelines and our immunocompromised population.

## 2021-03-12 NOTE — Progress Notes (Signed)
Per Dr. Julien Nordmann ,it isok to treat pt today with Alimta and Keytruda and heart rate of 101

## 2021-03-12 NOTE — Progress Notes (Signed)
[place] [phone]   [phone]  OFFICE PROGRESS NOTE  [name], DO No address on file  DIAGNOSIS: Metastatic non-small cell lung cancer initially diagnosed as stage IIIA (T3, N1/N2, M0) non-small cell lung cancer, adenocarcinoma presented with large right lower lobe lung mass with extension to the right hilum and subcarinal area diagnosed in July 2019.  She has brain metastasis in October 2020.  Biomarker Findings Tumor Mutational Burden - TMB-Intermediate (6 Muts/Mb) Microsatellite status - MS-Stable Genomic Findings For a complete list of the genes assayed, please refer to the Appendix. NRAS Q61R ARAF amplification STK11 G56W KRAS G13D MYCN amplification MCL1 amplification NKX2-1 amplification - equivocal? TP53 G245V 7 Disease relevant genes with no reportable alterations: EGFR, ALK, BRAF, MET, ERBB2, RET, ROS1   PRIOR THERAPY:  1) Course of concurrent chemoradiation with weekly carboplatin for AUC of 2 and paclitaxel 45 mg/M2.  Status post 7 cycles.  Last dose was giving 01/11/2018. 2) Consolidation treatment with immunotherapy with Imfinzi (Durvalumab) 10 mg/KG every 2 weeks.  First dose February 09, 2018.  Status post 19 cycles. 3) status post stereotactic body radiotherapy to the enlarging right supraclavicular lymphadenopathy under the care of Dr. Lisbeth Renshaw. 4) SRS to multiple brain metastasis under the care of Dr. Lisbeth Renshaw.  CURRENT THERAPY: Systemic chemotherapy with carboplatin for AUC of 5, Alimta 500 mg/M2 and Keytruda 200 mg IV every 3 weeks.  First dose 08/16/2019.  Status post 27 cycles.  Starting from cycle #5 the patient is on maintenance treatment with Alimta and Keytruda every 3 weeks.  INTERVAL HISTORY: Susan Holder 65 y.o. female returns to the clinic today for follow-up visit.  The patient is feeling fine today with no concerning complaints except for the increasing fatigue and early satiety.  She is unable  to gain any more weight.  She drinks Ensure once daily.  She denied having any current chest pain, shortness of breath, cough or hemoptysis.  She denied having any fever or chills.  She has no nausea, vomiting, diarrhea or constipation.  She had MRI of the brain performed recently that showed no concerning findings for disease progression in the brain.  She is here today for evaluation before starting cycle #28 of her treatment.  MEDICAL HISTORY: Past Medical History:  Diagnosis Date   Acid reflux 09/21/2018   Adenocarcinoma of right lung, stage 4 ([place]) 11/17/2017   Allergic rhinitis 08/22/2013   ANA positive 05/29/2016   Aortic atherosclerosis ([place]) 10/23/2020   Brain metastases ([place]) 03/29/2019   Coronary artery calcification seen on CT scan 10/23/2020   DDD (degenerative disc disease), cervical 05/29/2016   Encounter for antineoplastic chemotherapy 11/17/2017   Encounter for antineoplastic immunotherapy 02/01/2018   Goals of care, counseling/discussion 11/17/2017   High risk medication use 05/29/2016   04/29/2016: ==> plq 200 am & 100 qhs(adeq response).   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[place]) 11/04/2018   Other fatigue 05/29/2016   Pericardial effusion 10/23/2020   Primary malignant neoplasm of bronchus of right lower lobe ([place]) 12/21/2017   Rheumatoid arthritis ([place])    Rheumatoid arthritis involving multiple sites with positive rheumatoid factor ([place]) 05/29/2016   +RF +ANA +CCP    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   Smoker 05/29/2016   Trigger  finger, left ring finger 05/29/2016   Trigger finger, right ring finger 05/29/2016   Vitamin D deficiency 05/29/2016    ALLERGIES:  has No Known Allergies.  MEDICATIONS:  Current Outpatient Medications  Medication Sig Dispense Refill   folic acid (FOLVITE) 1 MG tablet TAKE 1 TABLET(1  MG) BY MOUTH DAILY 30 tablet 4   hydroxychloroquine (PLAQUENIL) 200 MG tablet TAKE 1 TABLET BY MOUTH TWICE DAILY MONDAY THROUGH FRIDAY ONLY. 120 tablet 0   omeprazole (PRILOSEC) 20 MG capsule Take 20 mg by mouth daily.     prochlorperazine (COMPAZINE) 10 MG tablet TAKE 1 TABLET(10 MG) BY MOUTH EVERY 6 HOURS AS NEEDED FOR NAUSEA OR VOMITING 30 tablet 2   vitamin C (ASCORBIC ACID) 500 MG tablet Take 500 mg by mouth daily.     VITAMIN D PO Take 1 tablet by mouth daily.     No current facility-administered medications for this visit.    SURGICAL HISTORY:  Past Surgical History:  Procedure Laterality Date   BRONCHIAL NEEDLE ASPIRATION BIOPSY  11/09/2017   Procedure: BRONCHIAL NEEDLE ASPIRATION BIOPSIES;  Surgeon: Marshell Garfinkel, MD;  Location: [facility];  Service: Cardiopulmonary;;   ENDOBRONCHIAL ULTRASOUND Bilateral 11/09/2017   Procedure: ENDOBRONCHIAL ULTRASOUND;  Surgeon: Marshell Garfinkel, MD;  [facility];  Service: Cardiopulmonary;  Laterality: Bilateral;   NASAL SINUS SURGERY     NASAL SINUS SURGERY     THORACENTESIS Right 02/07/2021   Procedure: THORACENTESIS;  Surgeon: Lajuana Matte, MD;  Location: [facility];  Service: Thoracic;  Laterality: Right;   TUBAL LIGATION     VIDEO BRONCHOSCOPY  11/09/2017   Procedure: VIDEO BRONCHOSCOPY;  Surgeon: Marshell Garfinkel, MD;  [facility];  Service: Cardiopulmonary;;   XI ROBOTIC ASSISTED PERICARDIAL WINDOW Left 02/07/2021   Procedure: XI ROBOTIC ASSISTED THORACOSCOPY PERICARDIAL WINDOW;  Surgeon: Lajuana Matte, MD;  Location: [facility];  Service: Thoracic;  Laterality: Left;    REVIEW OF SYSTEMS:  A comprehensive review of systems was negative except for: Constitutional: positive for fatigue Cardiovascular: positive for tachycardia    PHYSICAL EXAMINATION: General appearance: alert, cooperative, fatigued, and no distress Head: Normocephalic, without obvious abnormality, atraumatic Neck: no adenopathy, no JVD,  supple, symmetrical, trachea midline, and thyroid not enlarged, symmetric, no tenderness/mass/nodules Lymph nodes: Cervical, supraclavicular, and axillary nodes normal. Resp: clear to auscultation bilaterally Back: symmetric, no curvature. ROM normal. No CVA tenderness. Cardio: regular rate and rhythm, S1, S2 normal, no murmur, click, rub or gallop GI: soft, non-tender; bowel sounds normal; no masses,  no organomegaly Extremities: extremities normal, atraumatic, no cyanosis or edema  ECOG PERFORMANCE STATUS: 1 - Symptomatic but completely ambulatory  Blood pressure 139/70, pulse (!) 101, temperature 97.8 F (36.6 C), temperature source Temporal, resp. rate 17, height 5' (1.524 m), weight 105 lb 4.8 oz (47.8 kg), last menstrual period 04/28/2000, SpO2 97 %.   LABORATORY DATA: Lab Results  Component Value Date   WBC 10.9 (H) 02/19/2021   HGB 10.6 (L) 02/19/2021   HCT 33.6 (L) 02/19/2021   MCV 102.8 (H) 02/19/2021   PLT 456 (H) 02/19/2021      Chemistry      Component Value Date/Time   NA 140 02/19/2021 0905   K 4.1 02/19/2021 0905   CL 106 02/19/2021 0905   CO2 25 02/19/2021 0905   BUN 11 02/19/2021 0905   CREATININE 1.13 (H) 02/19/2021 0905   CREATININE 0.80 07/24/2017 1438      Component Value Date/Time   CALCIUM 10.0 02/19/2021 0905  ALKPHOS 171 (H) 02/19/2021 0905   AST 27 02/19/2021 0905   ALT 17 02/19/2021 0905   BILITOT 0.3 02/19/2021 0905       RADIOGRAPHIC STUDIES: MR Brain W Wo Contrast  Result Date: 03/01/2021 CLINICAL DATA:  Brain/CNS neoplasm, surveillance 3T SRS Protocol. Follow-up of treated metastatic lung cancer EXAM: MRI HEAD WITHOUT AND WITH CONTRAST TECHNIQUE: Multiplanar, multiecho pulse sequences of the brain and surrounding structures were obtained without and with intravenous contrast. CONTRAST:  95m MULTIHANCE GADOBENATE DIMEGLUMINE 529 MG/ML IV SOLN COMPARISON:  11/01/2020 FINDINGS: Brain: Metastatic lesions are identified and annotated on  series 11: Stable inferior left cerebellar lesion measuring 6 mm (image 30); Stable to decreased lateral left cerebellar lesion measuring 4 mm (image 34); Stable right cerebellar lesion measuring 2 mm (image 42); Stable inferior right frontal lobe measuring 3 mm (image 70); Stable inferior left frontal measuring 2 mm (image 90). No new mass or abnormal enhancement. There is no acute infarction or intracranial hemorrhage. Stable probable chronic microvascular ischemic changes in the cerebral white matter. No hydrocephalus. Vascular: Major vessel flow voids at the skull base are preserved. Skull and upper cervical spine: Normal marrow signal is preserved. Sinuses/Orbits: Paranasal sinuses are aerated. Orbits are unremarkable. Other: Sella is unremarkable.  Mastoid air cells are clear. IMPRESSION: Stable appearance of treated lesions.  No new lesion. Electronically Signed   By: PMacy MisM.D.   On: 03/01/2021 13:15    ASSESSMENT AND PLAN: This is a very pleasant 65years old African-American female with metastatic non-small cell lung cancer initially diagnosed with a stage IIIA non-small cell lung cancer, adenocarcinoma.  She underwent a course of concurrent chemoradiation with weekly carboplatin and paclitaxel status post 7 cycles with partial response.   The patient tolerated this course of treatment well except for mild odynophagia and dysphagia. She completed on consolidation treatment with immunotherapy with Imfinzi (Durvalumab) status post 18 cycles. She also completed SBRT to the right supraclavicular lymphadenopathy. The patient had evidence for multiple brain metastasis in October 2020 and she underwent SRS treatment to this lesion under the care of Dr. MLisbeth Renshaw The patient had evidence for disease progression and she started systemic chemotherapy with carboplatin, Alimta and Keytruda status post 27 cycles.  Starting from cycle #5 she is on maintenance treatment with Alimta and Keytruda every 3  weeks. She underwent pericardial window as well as right pleurodesis under the care of Dr. LKipp Broodand the final cytology was negative for malignancy. The patient continues to tolerate her treatment well with no concerning adverse effects. I recommended for her to proceed with cycle #28 today as planned. I will see her back for follow-up visit in 3 weeks for evaluation with repeat CT scan of the chest, abdomen and pelvis for restaging of her disease. For the weight loss she is followed by nutrition and she was encouraged to increase her oral intake. The patient was advised to call immediately if she has any other concerning symptoms in the interval. The patient voices understanding of current disease status and treatment options and is in agreement with the current care plan. All questions were answered. The patient knows to call the clinic with any problems, questions or concerns. We can certainly see the patient much sooner if necessary.  Disclaimer: This note was dictated with voice recognition software. Similar sounding words can inadvertently be transcribed and may not be corrected upon review.

## 2021-03-18 ENCOUNTER — Ambulatory Visit: Payer: Medicare PPO | Admitting: Cardiology

## 2021-03-18 ENCOUNTER — Ambulatory Visit: Payer: Medicare PPO | Admitting: Radiation Oncology

## 2021-03-18 ENCOUNTER — Telehealth: Payer: Self-pay | Admitting: Radiation Oncology

## 2021-03-18 NOTE — Telephone Encounter (Signed)
I called the patient to review her MRI scan with her.  Fortunately there does not appear to be any new disease.  We will plan to repeat her scan in 6 months time.  I apologized for the number of weeks between her scan and my call, she should have either been offered rescheduling or an appointment that would have been much sooner.  I canceled the formality of her follow-up as a result.  She is otherwise feeling well with the exception of fatigue from her chemoimmunotherapy regimen.  We discussed that we would plan a repeat an MRI of her brain in 6 months time.  She is in agreement with this plan.

## 2021-03-26 ENCOUNTER — Other Ambulatory Visit: Payer: Self-pay | Admitting: Thoracic Surgery (Cardiothoracic Vascular Surgery)

## 2021-03-26 DIAGNOSIS — C3491 Malignant neoplasm of unspecified part of right bronchus or lung: Secondary | ICD-10-CM

## 2021-03-28 NOTE — Progress Notes (Deleted)
Office Visit Note  Patient: Susan Holder             Date of Birth: [birth year]           MRN: [id number]             PCP: [name], DO Referring: [name], DO Visit Date: 04/10/2021 Occupation: _0 @  Subjective:  No chief complaint on file.   History of Present Illness: Susan Holder is a 65 y.o. female ***   Activities of Daily Living:  Patient reports morning stiffness for *** {minute/hour:19697}.   Patient {ACTIONS;DENIES/REPORTS:21021675::"Denies"} nocturnal pain.  Difficulty dressing/grooming: {ACTIONS;DENIES/REPORTS:21021675::"Denies"} Difficulty climbing stairs: {ACTIONS;DENIES/REPORTS:21021675::"Denies"} Difficulty getting out of chair: {ACTIONS;DENIES/REPORTS:21021675::"Denies"} Difficulty using hands for taps, buttons, cutlery, and/or writing: {ACTIONS;DENIES/REPORTS:21021675::"Denies"}  No Rheumatology ROS completed.   PMFS History:  Patient Active Problem List   Diagnosis Date Noted   Malnutrition of moderate degree 02/09/2021   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   Aortic atherosclerosis ([place]) 10/23/2020   Coronary artery calcification seen on CT scan 10/23/2020   Pericardial effusion 10/23/2020   met lung ca 10/22/2020   Rheumatoid arthritis ([place]) 10/22/2020   Brain metastases ([place]) 03/29/2019   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[place]) 11/04/2018   Acid reflux 09/21/2018   Hoarseness 03/16/2018   Encounter for antineoplastic immunotherapy 02/01/2018   Primary malignant neoplasm of bronchus of right lower lobe ([place]) 12/21/2017   Adenocarcinoma of right lung, stage 4 ([place]) 11/17/2017   Encounter for antineoplastic chemotherapy 11/17/2017   Goals of care, counseling/discussion 11/17/2017   Lung mass    Left hand pain 03/06/2017   Rheumatoid arthritis involving multiple sites with positive rheumatoid factor ([place]) 05/29/2016   ANA positive 05/29/2016   Vitamin D deficiency 05/29/2016   High risk medication use  05/29/2016   Trigger finger, left ring finger 05/29/2016   Trigger finger, right ring finger 05/29/2016   DDD (degenerative disc disease), cervical 05/29/2016   Smoker 05/29/2016   Other fatigue 05/29/2016   Allergic rhinitis 08/22/2013    Past Medical History:  Diagnosis Date   Acid reflux 09/21/2018   Adenocarcinoma of right lung, stage 4 ([place]) 11/17/2017   Allergic rhinitis 08/22/2013   ANA positive 05/29/2016   Aortic atherosclerosis ([place]) 10/23/2020   Brain metastases ([place]) 03/29/2019   Coronary artery calcification seen on CT scan 10/23/2020   DDD (degenerative disc disease), cervical 05/29/2016   Encounter for antineoplastic chemotherapy 11/17/2017   Encounter for antineoplastic immunotherapy 02/01/2018   Goals of care, counseling/discussion 11/17/2017   High risk medication use 05/29/2016   04/29/2016: ==> plq 200 am & 100 qhs(adeq response).   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) 11/04/2018   Other fatigue 05/29/2016   Pericardial effusion 10/23/2020   Primary malignant neoplasm of bronchus of right lower lobe ([place]) 12/21/2017   Rheumatoid arthritis ([facility])    Rheumatoid arthritis involving multiple sites with positive rheumatoid factor ([place]) 05/29/2016   +RF +ANA +CCP    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   Smoker 05/29/2016   Trigger finger, left ring finger 05/29/2016   Trigger finger, right ring finger 05/29/2016   Vitamin D deficiency 05/29/2016    Family History  Problem Relation Age of Onset   Stroke Mother    Alzheimer's disease Mother    Heart  disease Mother    Emphysema Father    Hypertension Brother    Heart attack Maternal Aunt    Heart failure Maternal Grandmother    Hypertension Paternal Grandmother    Past Surgical History:  Procedure Laterality Date   BRONCHIAL NEEDLE ASPIRATION BIOPSY   11/09/2017   Procedure: BRONCHIAL NEEDLE ASPIRATION BIOPSIES;  Surgeon: Marshell Garfinkel, MD;  [facility];  Service: Cardiopulmonary;;   ENDOBRONCHIAL ULTRASOUND Bilateral 11/09/2017   Procedure: ENDOBRONCHIAL ULTRASOUND;  Surgeon: Marshell Garfinkel, MD;  [facility];  Service: Cardiopulmonary;  Laterality: Bilateral;   NASAL SINUS SURGERY     NASAL SINUS SURGERY     THORACENTESIS Right 02/07/2021   Procedure: THORACENTESIS;  Surgeon: Lajuana Matte, MD;  Location: [facility];  Service: Thoracic;  Laterality: Right;   TUBAL LIGATION     VIDEO BRONCHOSCOPY  11/09/2017   Procedure: VIDEO BRONCHOSCOPY;  Surgeon: Marshell Garfinkel, MD;  [facility];  Service: Cardiopulmonary;;   XI ROBOTIC ASSISTED PERICARDIAL WINDOW Left 02/07/2021   Procedure: XI ROBOTIC ASSISTED THORACOSCOPY PERICARDIAL WINDOW;  Surgeon: Lajuana Matte, MD;  Location: [facility];  Service: Thoracic;  Laterality: Left;   Social History   Social History Narrative   Not on file   Immunization History  Administered Date(s) Administered   Influenza, High Dose Seasonal PF 01/22/2019   Influenza,inj,Quad PF,6+ Mos 03/08/2018   Influenza-Unspecified 02/08/2020   PFIZER(Purple Top)SARS-COV-2 Vaccination 06/02/2019, 06/23/2019, 12/24/2019     Objective: Vital Signs: LMP 04/28/2000    Physical Exam   Musculoskeletal Exam: ***  CDAI Exam: CDAI Score: -- Patient Global: --; Provider Global: -- Swollen: --; Tender: -- Joint Exam 04/10/2021   No joint exam has been documented for this visit   There is currently no information documented on the homunculus. Go to the Rheumatology activity and complete the homunculus joint exam.  Investigation: No additional findings.  Imaging: MR Brain W Wo Contrast  Result Date: 03/01/2021 CLINICAL DATA:  Brain/CNS neoplasm, surveillance 3T SRS Protocol. Follow-up of treated metastatic lung cancer EXAM: MRI HEAD WITHOUT AND WITH CONTRAST TECHNIQUE:  Multiplanar, multiecho pulse sequences of the brain and surrounding structures were obtained without and with intravenous contrast. CONTRAST:  35m MULTIHANCE GADOBENATE DIMEGLUMINE 529 MG/ML IV SOLN COMPARISON:  11/01/2020 FINDINGS: Brain: Metastatic lesions are identified and annotated on series 11: Stable inferior left cerebellar lesion measuring 6 mm (image 30); Stable to decreased lateral left cerebellar lesion measuring 4 mm (image 34); Stable right cerebellar lesion measuring 2 mm (image 42); Stable inferior right frontal lobe measuring 3 mm (image 70); Stable inferior left frontal measuring 2 mm (image 90). No new mass or abnormal enhancement. There is no acute infarction or intracranial hemorrhage. Stable probable chronic microvascular ischemic changes in the cerebral white matter. No hydrocephalus. Vascular: Major vessel flow voids at the skull base are preserved. Skull and upper cervical spine: Normal marrow signal is preserved. Sinuses/Orbits: Paranasal sinuses are aerated. Orbits are unremarkable. Other: Sella is unremarkable.  Mastoid air cells are clear. IMPRESSION: Stable appearance of treated lesions.  No new lesion. Electronically Signed   By: PMacy MisM.D.   On: 03/01/2021 13:15    Recent Labs: Lab Results  Component Value Date   WBC 10.0 03/12/2021   HGB 9.9 (L) 03/12/2021   PLT 520 (H) 03/12/2021   NA 139 03/12/2021   K 3.7 03/12/2021   CL 103 03/12/2021   CO2 24 03/12/2021   GLUCOSE 89 03/12/2021   BUN 13 03/12/2021   CREATININE 1.15 (  H) 03/12/2021   BILITOT <0.2 (L) 03/12/2021   ALKPHOS 97 03/12/2021   AST 25 03/12/2021   ALT 9 03/12/2021   PROT 7.5 03/12/2021   ALBUMIN 2.4 (L) 03/12/2021   CALCIUM 10.1 03/12/2021   GFRAA >60 01/17/2020    Speciality Comments: PLQ eye exam: 03/11/2021 WNL @ [facility]. Follow up in 1 year.  Procedures:  No procedures performed Allergies: Patient has no known allergies.   Assessment / Plan:     Visit Diagnoses: No  diagnosis found.  Orders: No orders of the defined types were placed in this encounter.  No orders of the defined types were placed in this encounter.   Face-to-face time spent with patient was *** minutes. Greater than 50% of time was spent in counseling and coordination of care.  Follow-Up Instructions: No follow-ups on file.   Earnestine Mealing, CMA  Note - This record has been created using Editor, commissioning.  Chart creation errors have been sought, but may not always  have been located. Such creation errors do not reflect on  the standard of medical care.

## 2021-03-29 ENCOUNTER — Other Ambulatory Visit: Payer: Self-pay

## 2021-03-29 ENCOUNTER — Ambulatory Visit (INDEPENDENT_AMBULATORY_CARE_PROVIDER_SITE_OTHER): Payer: Self-pay | Admitting: Thoracic Surgery (Cardiothoracic Vascular Surgery)

## 2021-03-29 ENCOUNTER — Ambulatory Visit
Admission: RE | Admit: 2021-03-29 | Discharge: 2021-03-29 | Disposition: A | Discharge status: Home / Self Care | Payer: Medicare PPO | Source: Ambulatory Visit | Attending: Thoracic Surgery (Cardiothoracic Vascular Surgery) | Admitting: Thoracic Surgery (Cardiothoracic Vascular Surgery)

## 2021-03-29 VITALS — BP 144/66 | HR 130 | Resp 20 | Ht 60.0 in | Wt 102.0 lb

## 2021-03-29 DIAGNOSIS — C3491 Malignant neoplasm of unspecified part of right bronchus or lung: Secondary | ICD-10-CM

## 2021-03-29 DIAGNOSIS — R918 Other nonspecific abnormal finding of lung field: Secondary | ICD-10-CM | POA: Diagnosis not present

## 2021-03-29 DIAGNOSIS — C3490 Malignant neoplasm of unspecified part of unspecified bronchus or lung: Secondary | ICD-10-CM | POA: Diagnosis not present

## 2021-03-29 DIAGNOSIS — J90 Pleural effusion, not elsewhere classified: Secondary | ICD-10-CM | POA: Diagnosis not present

## 2021-03-29 DIAGNOSIS — I3139 Other pericardial effusion (noninflammatory): Secondary | ICD-10-CM

## 2021-03-29 DIAGNOSIS — Z09 Encounter for follow-up examination after completed treatment for conditions other than malignant neoplasm: Secondary | ICD-10-CM

## 2021-03-29 NOTE — Progress Notes (Signed)
      [address]       [place] [address]             [phone]        Susan Holder [facility] Medical Record #[id number] Date of Birth: May 12, 1955  Referring: Susan Holder, Susan Cliche, MD Primary Care: Susan Nian, DO Primary Cardiologist:None  Reason for visit:   follow-up  History of Present Illness:     Susan Holder presents today for 1 month follow-up appointment.  She states that she is not doing well.  She remains quite short of breath.  This is much worse with exertion.  Physical Exam: BP (!) 144/66   Pulse (!) 130   Resp 20   Ht 5' (1.524 m)   Wt 102 lb (46.3 kg)   LMP 04/28/2000   SpO2 92% Comment: RA  BMI 19.92 kg/m   Alert NAD Abdomen soft, ND No peripheral edema   Diagnostic Studies & Laboratory data: CXR: Left effusion     Assessment / Plan:   Susan Holder is status post robotic assisted pericardial window.  She now has a left effusion which appears worse.  Additionally she is more symptomatic from a respiratory standpoint and remains tachycardic.  Made referral to Dr. Valeta Holder for thoracentesis.  I will touch base with her in 1 month with another chest x-ray.   Susan Holder 03/29/2021 4:56 PM

## 2021-04-02 ENCOUNTER — Inpatient Hospital Stay: Payer: Medicare PPO | Admitting: Internal Medicine

## 2021-04-02 ENCOUNTER — Inpatient Hospital Stay: Payer: Medicare PPO

## 2021-04-02 ENCOUNTER — Other Ambulatory Visit: Payer: Self-pay | Admitting: Physician Assistant

## 2021-04-02 ENCOUNTER — Other Ambulatory Visit: Payer: Self-pay | Admitting: Lab

## 2021-04-02 ENCOUNTER — Encounter: Payer: Self-pay | Admitting: Internal Medicine

## 2021-04-02 ENCOUNTER — Other Ambulatory Visit: Payer: Self-pay

## 2021-04-02 ENCOUNTER — Inpatient Hospital Stay: Payer: Medicare PPO | Attending: Internal Medicine

## 2021-04-02 VITALS — BP 145/74 | HR 120 | Temp 98.0°F | Resp 20 | Ht 60.0 in | Wt 103.6 lb

## 2021-04-02 DIAGNOSIS — Z5111 Encounter for antineoplastic chemotherapy: Secondary | ICD-10-CM | POA: Diagnosis not present

## 2021-04-02 DIAGNOSIS — R634 Abnormal weight loss: Secondary | ICD-10-CM | POA: Insufficient documentation

## 2021-04-02 DIAGNOSIS — C3491 Malignant neoplasm of unspecified part of right bronchus or lung: Secondary | ICD-10-CM

## 2021-04-02 DIAGNOSIS — C3490 Malignant neoplasm of unspecified part of unspecified bronchus or lung: Secondary | ICD-10-CM

## 2021-04-02 DIAGNOSIS — C7931 Secondary malignant neoplasm of brain: Secondary | ICD-10-CM | POA: Insufficient documentation

## 2021-04-02 DIAGNOSIS — Z5112 Encounter for antineoplastic immunotherapy: Secondary | ICD-10-CM | POA: Insufficient documentation

## 2021-04-02 DIAGNOSIS — R701 Abnormal plasma viscosity: Secondary | ICD-10-CM | POA: Insufficient documentation

## 2021-04-02 DIAGNOSIS — R000 Tachycardia, unspecified: Secondary | ICD-10-CM | POA: Diagnosis not present

## 2021-04-02 DIAGNOSIS — J90 Pleural effusion, not elsewhere classified: Secondary | ICD-10-CM | POA: Insufficient documentation

## 2021-04-02 DIAGNOSIS — C3431 Malignant neoplasm of lower lobe, right bronchus or lung: Secondary | ICD-10-CM | POA: Insufficient documentation

## 2021-04-02 DIAGNOSIS — D649 Anemia, unspecified: Secondary | ICD-10-CM | POA: Insufficient documentation

## 2021-04-02 DIAGNOSIS — Z9889 Other specified postprocedural states: Secondary | ICD-10-CM | POA: Insufficient documentation

## 2021-04-02 DIAGNOSIS — R0902 Hypoxemia: Secondary | ICD-10-CM | POA: Diagnosis not present

## 2021-04-02 DIAGNOSIS — D539 Nutritional anemia, unspecified: Secondary | ICD-10-CM | POA: Diagnosis not present

## 2021-04-02 DIAGNOSIS — R5383 Other fatigue: Secondary | ICD-10-CM | POA: Diagnosis not present

## 2021-04-02 DIAGNOSIS — R530 Neoplastic (malignant) related fatigue: Secondary | ICD-10-CM | POA: Insufficient documentation

## 2021-04-02 LAB — CBC WITH DIFFERENTIAL (CANCER CENTER ONLY)
Abs Immature Granulocytes: 0.07 10*3/uL (ref 0.00–0.07)
Basophils Absolute: 0 10*3/uL (ref 0.0–0.1)
Basophils Relative: 1 %
Eosinophils Absolute: 0.1 10*3/uL (ref 0.0–0.5)
Eosinophils Relative: 1 %
HCT: 27.3 % — ABNORMAL LOW (ref 36.0–46.0)
Hemoglobin: 8.7 g/dL — ABNORMAL LOW (ref 12.0–15.0)
Immature Granulocytes: 1 %
Lymphocytes Relative: 10 %
Lymphs Abs: 0.9 10*3/uL (ref 0.7–4.0)
MCH: 34.5 pg — ABNORMAL HIGH (ref 26.0–34.0)
MCHC: 31.9 g/dL (ref 30.0–36.0)
MCV: 108.3 fL — ABNORMAL HIGH (ref 80.0–100.0)
Monocytes Absolute: 1.1 10*3/uL — ABNORMAL HIGH (ref 0.1–1.0)
Monocytes Relative: 13 %
Neutro Abs: 6.5 10*3/uL (ref 1.7–7.7)
Neutrophils Relative %: 74 %
Platelet Count: 560 10*3/uL — ABNORMAL HIGH (ref 150–400)
RBC: 2.52 MIL/uL — ABNORMAL LOW (ref 3.87–5.11)
RDW: 19.3 % — ABNORMAL HIGH (ref 11.5–15.5)
WBC Count: 8.6 10*3/uL (ref 4.0–10.5)
nRBC: 0 % (ref 0.0–0.2)

## 2021-04-02 LAB — TSH: TSH: 1.468 u[IU]/mL (ref 0.308–3.960)

## 2021-04-02 LAB — CMP (CANCER CENTER ONLY)
ALT: 10 U/L (ref 0–44)
AST: 29 U/L (ref 15–41)
Albumin: 2.3 g/dL — ABNORMAL LOW (ref 3.5–5.0)
Alkaline Phosphatase: 72 U/L (ref 38–126)
Anion gap: 13 (ref 5–15)
BUN: 11 mg/dL (ref 8–23)
CO2: 25 mmol/L (ref 22–32)
Calcium: 10.3 mg/dL (ref 8.9–10.3)
Chloride: 101 mmol/L (ref 98–111)
Creatinine: 1.24 mg/dL — ABNORMAL HIGH (ref 0.44–1.00)
GFR, Estimated: 48 mL/min — ABNORMAL LOW (ref >60)
Glucose, Bld: 105 mg/dL — ABNORMAL HIGH (ref 70–99)
Potassium: 3.7 mmol/L (ref 3.5–5.1)
Sodium: 139 mmol/L (ref 135–145)
Total Bilirubin: <0.2 mg/dL — ABNORMAL LOW (ref 0.3–1.2)
Total Protein: 7.2 g/dL (ref 6.5–8.1)

## 2021-04-02 LAB — FOLATE: Folate: >100 ng/mL (ref >5.9)

## 2021-04-02 LAB — RETICULOCYTES
Immature Retic Fract: 28.1 % — ABNORMAL HIGH (ref 2.3–15.9)
RBC.: 2.33 MIL/uL — ABNORMAL LOW (ref 3.87–5.11)
Retic Count, Absolute: 107.2 10*3/uL (ref 19.0–186.0)
Retic Ct Pct: 4.6 % — ABNORMAL HIGH (ref 0.4–3.1)

## 2021-04-02 LAB — VITAMIN B12: Vitamin B-12: >7500 pg/mL — ABNORMAL HIGH (ref 180–914)

## 2021-04-02 LAB — IRON AND TIBC
Iron: 37 ug/dL — ABNORMAL LOW (ref 41–142)
Saturation Ratios: 15 % — ABNORMAL LOW (ref 21–57)
TIBC: 244 ug/dL (ref 236–444)
UIBC: 207 ug/dL (ref 120–384)

## 2021-04-02 LAB — FERRITIN: Ferritin: 1376 ng/mL — ABNORMAL HIGH (ref 11–307)

## 2021-04-02 MED ORDER — SODIUM CHLORIDE 0.9 % IV SOLN
400.0000 mg/m2 | Freq: Once | INTRAVENOUS | Status: AC
  Administered 2021-04-02: 600 mg via INTRAVENOUS
  Filled 2021-04-02: qty 24

## 2021-04-02 MED ORDER — SODIUM CHLORIDE 0.9 % IV SOLN: Freq: Once | INTRAVENOUS | Status: AC

## 2021-04-02 MED ORDER — SODIUM CHLORIDE 0.9 % IV SOLN
200.0000 mg | Freq: Once | INTRAVENOUS | Status: AC
  Administered 2021-04-02: 200 mg via INTRAVENOUS
  Filled 2021-04-02: qty 8

## 2021-04-02 MED ORDER — SODIUM CHLORIDE 0.9 % IV SOLN
375.0000 mg/m2 | Freq: Once | INTRAVENOUS | Status: DC
  Filled 2021-04-02: qty 24

## 2021-04-02 MED ORDER — CYANOCOBALAMIN 1000 MCG/ML IJ SOLN
1000.0000 ug | Freq: Once | INTRAMUSCULAR | Status: AC
  Administered 2021-04-02: 1000 ug via INTRAMUSCULAR
  Filled 2021-04-02: qty 1

## 2021-04-02 MED ORDER — PROCHLORPERAZINE MALEATE 10 MG PO TABS
10.0000 mg | ORAL_TABLET | Freq: Once | ORAL | Status: AC
  Administered 2021-04-02: 10 mg via ORAL
  Filled 2021-04-02: qty 1

## 2021-04-02 MED ORDER — SODIUM CHLORIDE 0.9 % IV SOLN: 375.0000 mg/m2 | Freq: Once | INTRAVENOUS | Status: DC

## 2021-04-02 NOTE — Progress Notes (Signed)
Alimta dosed at 400 mg/m2 dose = 600 mg.  Ok'd by Dr. Julien Nordmann.  Kennith Center, Pharm.D., CPP 04/02/2021@12 :57 PM

## 2021-04-02 NOTE — Progress Notes (Signed)
Per Dr. Julien Nordmann , it is okay to treat pt today with Alimta and Keytruda and heart rate of 120/minute.

## 2021-04-02 NOTE — Patient Instructions (Signed)
[facility]  Discharge Instructions: Thank you for choosing [facility] to provide your oncology and hematology care.   If you have a lab appointment with the [facility], please go directly to the [facility] and check in at the registration area.   Wear comfortable clothing and clothing appropriate for easy access to any Portacath or PICC line.   We strive to give you quality time with your provider. You may need to reschedule your appointment if you arrive late (15 or more minutes).  Arriving late affects you and other patients whose appointments are after yours.  Also, if you miss three or more appointments without notifying the office, you may be dismissed from the clinic at the provider's discretion.      For prescription refill requests, have your pharmacy contact our office and allow 72 hours for refills to be completed.     Today you received the following chemotherapy and immunotherapy agents Keytruda and Alimta      To help prevent nausea and vomiting after your treatment, we encourage you to take your nausea medication as directed.  BELOW ARE SYMPTOMS THAT SHOULD BE REPORTED IMMEDIATELY: *FEVER GREATER THAN 100.4 F (38 C) OR HIGHER *CHILLS OR SWEATING *NAUSEA AND VOMITING THAT IS NOT CONTROLLED WITH YOUR NAUSEA MEDICATION *UNUSUAL SHORTNESS OF BREATH *UNUSUAL BRUISING OR BLEEDING *URINARY PROBLEMS (pain or burning when urinating, or frequent urination) *BOWEL PROBLEMS (unusual diarrhea, constipation, pain near the anus) TENDERNESS IN MOUTH AND THROAT WITH OR WITHOUT PRESENCE OF ULCERS (sore throat, sores in mouth, or a toothache) UNUSUAL RASH, SWELLING OR PAIN  UNUSUAL VAGINAL DISCHARGE OR ITCHING   Items with * indicate a potential emergency and should be followed up as soon as possible or go to the Emergency Department if any problems should occur.  Please show the CHEMOTHERAPY ALERT CARD or IMMUNOTHERAPY ALERT CARD at  check-in to the Emergency Department and triage nurse.  Should you have questions after your visit or need to cancel or reschedule your appointment, please contact [facility]  Dept: [phone]  and follow the prompts.  Office hours are 8:00 a.m. to 4:30 p.m. Monday - Friday. Please note that voicemails left after 4:00 p.m. may not be returned until the following business day.  We are closed weekends and major holidays. You have access to a nurse at all times for urgent questions. Please call the main number to the clinic Dept: [phone] and follow the prompts.   For any non-urgent questions, you may also contact your provider using MyChart. We now offer e-Visits for anyone 31 and older to request care online for non-urgent symptoms. For details visit [email or web address].   Also download the MyChart app! Go to the app store, search "MyChart", open the app, select [facility], and log in with your MyChart username and password.  Due to Covid, a mask is required upon entering the hospital/clinic. If you do not have a mask, one will be given to you upon arrival. For doctor visits, patients may have 1 support person aged 36 or older with them. For treatment visits, patients cannot have anyone with them due to current Covid guidelines and our immunocompromised population.

## 2021-04-02 NOTE — Progress Notes (Signed)
[facility] [phone]   [phone]  OFFICE PROGRESS NOTE  [name], DO No address on file  DIAGNOSIS: Metastatic non-small cell lung cancer initially diagnosed as stage IIIA (T3, N1/N2, M0) non-small cell lung cancer, adenocarcinoma presented with large right lower lobe lung mass with extension to the right hilum and subcarinal area diagnosed in July 2019.  She has brain metastasis in October 2020.  Biomarker Findings Tumor Mutational Burden - TMB-Intermediate (6 Muts/Mb) Microsatellite status - MS-Stable Genomic Findings For a complete list of the genes assayed, please refer to the Appendix. NRAS Q61R ARAF amplification STK11 G56W KRAS G13D MYCN amplification MCL1 amplification NKX2-1 amplification - equivocal? TP53 G245V 7 Disease relevant genes with no reportable alterations: EGFR, ALK, BRAF, MET, ERBB2, RET, ROS1   PRIOR THERAPY:  1) Course of concurrent chemoradiation with weekly carboplatin for AUC of 2 and paclitaxel 45 mg/M2.  Status post 7 cycles.  Last dose was giving 01/11/2018. 2) Consolidation treatment with immunotherapy with Imfinzi (Durvalumab) 10 mg/KG every 2 weeks.  First dose February 09, 2018.  Status post 19 cycles. 3) status post stereotactic body radiotherapy to the enlarging right supraclavicular lymphadenopathy under the care of Dr. Lisbeth Renshaw. 4) SRS to multiple brain metastasis under the care of Dr. Lisbeth Renshaw.  CURRENT THERAPY: Systemic chemotherapy with carboplatin for AUC of 5, Alimta 500 mg/M2 and Keytruda 200 mg IV every 3 weeks.  First dose 08/16/2019.  Status post 28 cycles.  Starting from cycle #5 the patient is on maintenance treatment with Alimta and Keytruda every 3 weeks.  INTERVAL HISTORY: Susan Holder 65 y.o. female returns to the clinic today for follow-up visit.  The patient continues to complain of increasing fatigue and weakness as well as weight loss.  She lost around 2 pounds since her last  visit.  She eats well but has early satiety.  She was followed by Dr. Kipp Brood for the left pleural effusion and he referred her to Dr. Valeta Harms for consideration of thoracentesis.  She denied having any current chest pain but has shortness of breath at baseline increased with exertion with no cough or hemoptysis.  She denied having any nausea, vomiting, diarrhea or constipation.  She continues to have anemia and tachycardia.  The patient is here today for evaluation before starting cycle #29.  MEDICAL HISTORY: Past Medical History:  Diagnosis Date   Acid reflux 09/21/2018   Adenocarcinoma of right lung, stage 4 ([facility]) 11/17/2017   Allergic rhinitis 08/22/2013   ANA positive 05/29/2016   Aortic atherosclerosis ([place]) 10/23/2020   Brain metastases ([place]) 03/29/2019   Coronary artery calcification seen on CT scan 10/23/2020   DDD (degenerative disc disease), cervical 05/29/2016   Encounter for antineoplastic chemotherapy 11/17/2017   Encounter for antineoplastic immunotherapy 02/01/2018   Goals of care, counseling/discussion 11/17/2017   High risk medication use 05/29/2016   04/29/2016: ==> plq 200 am & 100 qhs(adeq response).   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  MEDICATIONS:  Current Outpatient Medications  Medication Sig  Dispense Refill   folic acid (FOLVITE) 1 MG tablet TAKE 1 TABLET(1 MG) BY MOUTH DAILY 30 tablet 4   hydroxychloroquine (PLAQUENIL) 200 MG tablet TAKE 1 TABLET BY MOUTH TWICE DAILY MONDAY THROUGH FRIDAY ONLY. 120 tablet 0   omeprazole (PRILOSEC) 20 MG capsule Take 20 mg by mouth daily.     prochlorperazine (COMPAZINE) 10 MG tablet TAKE 1 TABLET(10 MG) BY MOUTH EVERY 6 HOURS AS NEEDED FOR NAUSEA OR VOMITING 30 tablet 2   vitamin C (ASCORBIC ACID) 500 MG tablet Take 500 mg by mouth daily.     VITAMIN D PO Take 1 tablet by mouth daily.     No current facility-administered medications for this visit.    SURGICAL HISTORY:  Past Surgical History:  Procedure Laterality Date   BRONCHIAL NEEDLE ASPIRATION BIOPSY  11/09/2017   Procedure: BRONCHIAL NEEDLE ASPIRATION BIOPSIES;  Surgeon: Marshell Garfinkel, MD;  Location: [facility];  Service: Cardiopulmonary;;   ENDOBRONCHIAL ULTRASOUND Bilateral 11/09/2017   Procedure: ENDOBRONCHIAL ULTRASOUND;  Surgeon: Marshell Garfinkel, MD;  Location: [facility];  Service: Cardiopulmonary;  Laterality: Bilateral;   NASAL SINUS SURGERY     NASAL SINUS SURGERY     THORACENTESIS Right 02/07/2021   Procedure: THORACENTESIS;  Surgeon: Lajuana Matte, MD;  Location: [facility];  Service: Thoracic;  Laterality: Right;   TUBAL LIGATION     VIDEO BRONCHOSCOPY  11/09/2017   Procedure: VIDEO BRONCHOSCOPY;  Surgeon: Marshell Garfinkel, MD;  Location: [facility];  Service: Cardiopulmonary;;   XI ROBOTIC ASSISTED PERICARDIAL WINDOW Left 02/07/2021   Procedure: XI ROBOTIC ASSISTED THORACOSCOPY PERICARDIAL WINDOW;  Surgeon: Lajuana Matte, MD;  Location: [facility];  Service: Thoracic;  Laterality: Left;    REVIEW OF SYSTEMS:  Constitutional: positive for anorexia, fatigue, and weight loss Eyes: negative Ears, nose, mouth, throat, and face: negative Respiratory: positive for dyspnea on exertion Cardiovascular: negative Gastrointestinal:  negative Genitourinary:negative Integument/breast: negative Hematologic/lymphatic: negative Musculoskeletal:negative Neurological: negative Behavioral/Psych: negative Endocrine: negative Allergic/Immunologic: negative   PHYSICAL EXAMINATION: General appearance: alert, cooperative, fatigued, and no distress Head: Normocephalic, without obvious abnormality, atraumatic Neck: no adenopathy, no JVD, supple, symmetrical, trachea midline, and thyroid not enlarged, symmetric, no tenderness/mass/nodules Lymph nodes: Cervical, supraclavicular, and axillary nodes normal. Resp: clear to auscultation bilaterally Back: symmetric, no curvature. ROM normal. No CVA tenderness. Cardio: regular rate and rhythm, S1, S2 normal, no murmur, click, rub or gallop GI: soft, non-tender; bowel sounds normal; no masses,  no organomegaly Extremities: extremities normal, atraumatic, no cyanosis or edema Neurologic: Alert and oriented X 3, normal strength and tone. Normal symmetric reflexes. Normal coordination and gait  ECOG PERFORMANCE STATUS: 1 - Symptomatic but completely ambulatory  Blood pressure (!) 145/74, pulse 98, temperature 98 F (36.7 C), temperature source Tympanic, resp. rate (!) 120, height 5' (1.524 m), weight 103 lb 9.6 oz (47 kg), last menstrual period 04/28/2000, SpO2 98 %.   LABORATORY DATA: Lab Results  Component Value Date   WBC 8.6 04/02/2021   HGB 8.7 (L) 04/02/2021   HCT 27.3 (L) 04/02/2021   MCV 108.3 (H) 04/02/2021   PLT 560 (H) 04/02/2021      Chemistry      Component Value Date/Time   NA 139 03/12/2021 0934  K 3.7 03/12/2021 0934   CL 103 03/12/2021 0934   CO2 24 03/12/2021 0934   BUN 13 03/12/2021 0934   CREATININE 1.15 (H) 03/12/2021 0934   CREATININE 0.80 07/24/2017 1438      Component Value Date/Time   CALCIUM 10.1 03/12/2021 0934   ALKPHOS 97 03/12/2021 0934   AST 25 03/12/2021 0934   ALT 9 03/12/2021 0934   BILITOT <0.2 (L) 03/12/2021 0934        RADIOGRAPHIC STUDIES: DG Chest 2 View  Result Date: 03/29/2021 CLINICAL DATA:  65 year old female with lung cancer. EXAM: CHEST - 2 VIEW COMPARISON:  Portable chest 02/10/2021 and earlier. FINDINGS: Chronic architectural distortion about the right hilum. Stable mediastinal contours. Small to moderate left pleural effusion has significantly progressed from last exam. Ongoing right lung base effusion appears more stable. No pneumothorax. Streaky increased infrahilar lung opacity is nonspecific. Visualized tracheal air column is within normal limits. No acute osseous abnormality identified. Negative visible bowel gas. Right chest wall subcutaneous gas has resolved. IMPRESSION: 1. Increased left lung base effusion since October. And nonspecific increased streaky bibasilar lung opacity. Top differential considerations include atelectasis, post radiation change, infection. 2. Right lung base effusion is stable, along with right upper lobe architectural distortion. Electronically Signed   By: Genevie Ann M.D.   On: 03/29/2021 11:36    ASSESSMENT AND PLAN: This is a very pleasant 65 years old African-American female with metastatic non-small cell lung cancer initially diagnosed with a stage IIIA non-small cell lung cancer, adenocarcinoma.  She underwent a course of concurrent chemoradiation with weekly carboplatin and paclitaxel status post 7 cycles with partial response.   The patient tolerated this course of treatment well except for mild odynophagia and dysphagia. She completed on consolidation treatment with immunotherapy with Imfinzi (Durvalumab) status post 18 cycles. She also completed SBRT to the right supraclavicular lymphadenopathy. The patient had evidence for multiple brain metastasis in October 2020 and she underwent SRS treatment to this lesion under the care of Dr. Lisbeth Renshaw. The patient had evidence for disease progression and she started systemic chemotherapy with carboplatin, Alimta and Keytruda  status post 28 cycles.  Starting from cycle #5 she is on maintenance treatment with Alimta and Keytruda every 3 weeks. The patient has been tolerating her treatment well with no concerning adverse effects except for the fatigue. I recommended for the patient to proceed with cycle #29 today as planned. I will arrange for her to have repeat CT scan of the chest, abdomen and pelvis before her next visit. For the persistent anemia, I will check anemia panel today. For the recurrent left pleural effusion she was referred to Dr. Valeta Harms for consideration of thoracentesis. For the weight loss she is followed by nutrition and she was encouraged to increase her oral intake. The patient was advised to call immediately if she has any other concerning symptoms in the interval. The patient voices understanding of current disease status and treatment options and is in agreement with the current care plan. All questions were answered. The patient knows to call the clinic with any problems, questions or concerns. We can certainly see the patient much sooner if necessary.  Disclaimer: This note was dictated with voice recognition software. Similar sounding words can inadvertently be transcribed and may not be corrected upon review.

## 2021-04-02 NOTE — Progress Notes (Signed)
Per Dr. Julien Nordmann, use today's weight for Alimta dose calculation.  Will adj dose accordingly.  Kennith Center, Pharm.D., CPP 04/02/2021@12 :45 PM

## 2021-04-03 ENCOUNTER — Ambulatory Visit: Payer: Medicare PPO | Admitting: Pulmonary Disease

## 2021-04-03 ENCOUNTER — Ambulatory Visit (INDEPENDENT_AMBULATORY_CARE_PROVIDER_SITE_OTHER): Payer: Medicare PPO

## 2021-04-03 ENCOUNTER — Other Ambulatory Visit (HOSPITAL_COMMUNITY)
Admission: RE | Admit: 2021-04-03 | Discharge: 2021-04-03 | Disposition: A | Discharge status: Home / Self Care | Payer: Medicare PPO | Source: Ambulatory Visit | Attending: Pulmonary Disease | Admitting: Pulmonary Disease

## 2021-04-03 ENCOUNTER — Encounter: Payer: Self-pay | Admitting: Pulmonary Disease

## 2021-04-03 ENCOUNTER — Other Ambulatory Visit (HOSPITAL_COMMUNITY)
Admission: RE | Admit: 2021-04-03 | Discharge: 2021-04-03 | Disposition: A | Discharge status: Home / Self Care | Payer: Medicare PPO | Attending: Pulmonary Disease | Admitting: Pulmonary Disease

## 2021-04-03 VITALS — BP 124/64 | HR 117 | Temp 97.7°F | Ht 66.0 in | Wt 103.0 lb

## 2021-04-03 DIAGNOSIS — J90 Pleural effusion, not elsewhere classified: Secondary | ICD-10-CM | POA: Diagnosis not present

## 2021-04-03 DIAGNOSIS — C3491 Malignant neoplasm of unspecified part of right bronchus or lung: Secondary | ICD-10-CM

## 2021-04-03 DIAGNOSIS — R701 Abnormal plasma viscosity: Secondary | ICD-10-CM | POA: Diagnosis not present

## 2021-04-03 DIAGNOSIS — D649 Anemia, unspecified: Secondary | ICD-10-CM | POA: Diagnosis not present

## 2021-04-03 DIAGNOSIS — Z9889 Other specified postprocedural states: Secondary | ICD-10-CM | POA: Diagnosis not present

## 2021-04-03 DIAGNOSIS — J948 Other specified pleural conditions: Secondary | ICD-10-CM | POA: Diagnosis not present

## 2021-04-03 DIAGNOSIS — Z5112 Encounter for antineoplastic immunotherapy: Secondary | ICD-10-CM | POA: Diagnosis not present

## 2021-04-03 DIAGNOSIS — Z5111 Encounter for antineoplastic chemotherapy: Secondary | ICD-10-CM | POA: Diagnosis not present

## 2021-04-03 DIAGNOSIS — C7931 Secondary malignant neoplasm of brain: Secondary | ICD-10-CM

## 2021-04-03 DIAGNOSIS — R634 Abnormal weight loss: Secondary | ICD-10-CM | POA: Diagnosis not present

## 2021-04-03 DIAGNOSIS — C3431 Malignant neoplasm of lower lobe, right bronchus or lung: Secondary | ICD-10-CM | POA: Diagnosis not present

## 2021-04-03 LAB — BODY FLUID CELL COUNT WITH DIFFERENTIAL
Eos, Fluid: 0 %
Lymphs, Fluid: 99 %
Monocyte-Macrophage-Serous Fluid: 1 % — ABNORMAL LOW (ref 50–90)
Neutrophil Count, Fluid: 0 % (ref 0–25)
Other Cells, Fluid: UNDETERMINED %
Total Nucleated Cell Count, Fluid: 297 cu mm (ref 0–1000)

## 2021-04-03 LAB — GLUCOSE, PLEURAL OR PERITONEAL FLUID: Glucose, Fluid: 103 mg/dL

## 2021-04-03 LAB — ALBUMIN, PLEURAL OR PERITONEAL FLUID: Albumin, Fluid: 1.7 g/dL

## 2021-04-03 LAB — LACTATE DEHYDROGENASE, PLEURAL OR PERITONEAL FLUID: LD, Fluid: 149 U/L — ABNORMAL HIGH (ref 3–23)

## 2021-04-03 LAB — PROTEIN, PLEURAL OR PERITONEAL FLUID: Total protein, fluid: 3.9 g/dL

## 2021-04-03 NOTE — Patient Instructions (Signed)
Thank you for visiting Dr. Valeta Harms at [facility]. Today we recommend the following:  Orders Placed This Encounter  Procedures   Body fluid culture   Gram stain   DG Chest 2 View   Body fluid cell count with differential   Protein, total   Glucose, random   Cholesterol, body fluid   Albumin   Amylase   Lactate dehydrogenase   Triglycerides, Body Fluid   Left sided thoracentesis on Friday of next week.   Return in about 9 days (around 04/12/2021) for w/ Dr/ Marquiz Sotelo .    Please do your part to reduce the spread of COVID-19.

## 2021-04-03 NOTE — Progress Notes (Signed)
Thoracentesis  Procedure Note  Susan Holder  [id number]  1955/09/29  Date:04/03/21  Time:12:57 PM   Provider Performing:Tristen Luce L Saleen Peden   Procedure: Thoracentesis with imaging guidance (27517)  Indication(s) Pleural Effusion  Consent Risks of the procedure as well as the alternatives and risks of each were explained to the patient and/or caregiver.  Consent for the procedure was obtained and is signed in the bedside chart  Anesthesia Topical only with 1% lidocaine   Time Out Verified patient identification, verified procedure, site/side was marked, verified correct patient position, special equipment/implants available, medications/allergies/relevant history reviewed, required imaging and test results available.  Sterile Technique Maximal sterile technique including full sterile barrier drape, hand hygiene, sterile gown, sterile gloves, mask, hair covering, sterile ultrasound probe cover (if used).  Procedure Description Ultrasound was used to identify appropriate pleural anatomy for placement and overlying skin marked.  Area of drainage cleaned and draped in sterile fashion. Lidocaine was used to anesthetize the skin and subcutaneous tissue.  550 cc's of amber appearing fluid was drained from the right pleural space. Catheter then removed and bandaid applied to site.   Complications/Tolerance None; patient tolerated the procedure well. Chest X-ray is ordered to confirm no post-procedural complication.   EBL Minimal   Specimen(s) Pleural fluid   Garner Nash, DO [facility] 04/03/2021 6:08 PM

## 2021-04-03 NOTE — Progress Notes (Signed)
Synopsis: Referred in December 2022 for pleural effusion by Lajuana Matte, MD  Subjective:   PATIENT ID: Susan Holder GENDER: female DOB: July 21, 1955, MRN: [id number]  Chief Complaint  Patient presents with   Follow-up    Patient is here for thoracentesis    This is a 65 year old female, stage IV lung cancer, brain metastasis, pericardial effusion status post pericardial window, history of rheumatoid arthritis RF, ANA and anti-CCP positive.  Patient currently undergoing therapy with Dr. Earlie Server from medical oncology.  Recently saw Dr. Kipp Brood in clinic with bilateral pleural effusion.  Referred today for evaluation of pleural effusion consideration for thoracentesis.  Patient is not on any blood thinners.  She does have some shortness of breath with exertion.   Past Medical History:  Diagnosis Date   Acid reflux 09/21/2018   Adenocarcinoma of right lung, stage 4 ([place]) 11/17/2017   Allergic rhinitis 08/22/2013   ANA positive 05/29/2016   Aortic atherosclerosis ([place]) 10/23/2020   Brain metastases ([place]) 03/29/2019   Coronary artery calcification seen on CT scan 10/23/2020   DDD (degenerative disc disease), cervical 05/29/2016   Encounter for antineoplastic chemotherapy 11/17/2017   Encounter for antineoplastic immunotherapy 02/01/2018   Goals of care, counseling/discussion 11/17/2017   High risk medication use 05/29/2016   04/29/2016: ==> plq 200 am & 100 qhs(adeq response).   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) 11/04/2018   Other fatigue 05/29/2016   Pericardial effusion 10/23/2020   Primary malignant neoplasm of bronchus of right lower lobe ([place]) 12/21/2017   Rheumatoid arthritis (HCC)    Rheumatoid arthritis involving multiple sites with positive rheumatoid factor ([place]) 05/29/2016   +RF +ANA +CCP    S/P  pericardial window creation 02/07/2021   Smoker 05/29/2016   Trigger finger, left ring finger 05/29/2016   Trigger finger, right ring finger 05/29/2016   Vitamin D deficiency 05/29/2016     Family History  Problem Relation Age of Onset   Stroke Mother    Alzheimer's disease Mother    Heart disease Mother    Emphysema Father    Hypertension Brother    Heart attack Maternal Aunt    Heart failure Maternal Grandmother    Hypertension Paternal Grandmother      Past Surgical History:  Procedure Laterality Date   BRONCHIAL NEEDLE ASPIRATION BIOPSY  11/09/2017   Procedure: BRONCHIAL NEEDLE ASPIRATION BIOPSIES;  Surgeon: Marshell Garfinkel, MD;  Location: [facility];  Service: Cardiopulmonary;;   ENDOBRONCHIAL ULTRASOUND Bilateral 11/09/2017   Procedure: ENDOBRONCHIAL ULTRASOUND;  Surgeon: Marshell Garfinkel, MD;  Location: [facility];  Service: Cardiopulmonary;  Laterality: Bilateral;   NASAL SINUS SURGERY     NASAL SINUS SURGERY     THORACENTESIS Right 02/07/2021   Procedure: THORACENTESIS;  Surgeon: Lajuana Matte, MD;  Location: [facility];  Service: Thoracic;  Laterality: Right;   TUBAL LIGATION     VIDEO BRONCHOSCOPY  11/09/2017   Procedure: VIDEO BRONCHOSCOPY;  Surgeon: Marshell Garfinkel, MD;  Location: [facility];  Service: Cardiopulmonary;;   XI ROBOTIC ASSISTED PERICARDIAL WINDOW Left 02/07/2021   Procedure: XI ROBOTIC ASSISTED THORACOSCOPY PERICARDIAL WINDOW;  Surgeon: Lajuana Matte, MD;  Location: [facility];  Service: Thoracic;  Laterality: Left;    Social History   Socioeconomic History   Marital status: Divorced    Spouse name:  Not on file   Number of children: 1   Years of education: Not on file   Highest education level: Not on file  Occupational History   Not on file  Tobacco Use   Smoking status: Former    Packs/day: 0.50    Years: 45.00    Pack years: 22.50    Types: Cigarettes    Quit date: 09/29/2017    Years since quitting: 3.5   Smokeless tobacco: Never   Vaping Use   Vaping Use: Never used  Substance and Sexual Activity   Alcohol use: Not Currently   Drug use: No   Sexual activity: Not Currently  Other Topics Concern   Not on file  Social History Narrative   Not on file   Social Determinants of Health   Financial Resource Strain: Low Risk    Difficulty of Paying Living Expenses: Not hard at all  Food Insecurity: No Food Insecurity   Worried About Charity fundraiser in the Last Year: Never true   [place] in the Last Year: Never true  Transportation Needs: No Transportation Needs   Lack of Transportation (Medical): No   Lack of Transportation (Non-Medical): No  Physical Activity: Inactive   Days of Exercise per Week: 0 days   Minutes of Exercise per Session: 0 min  Stress: No Stress Concern Present   Feeling of Stress : Not at all  Social Connections: Moderately Isolated   Frequency of Communication with Friends and Family: More than three times a week   Frequency of Social Gatherings with Friends and Family: Twice a week   Attends Religious Services: More than 4 times per year   Active Member of [employer] or Organizations: No   Attends Archivist Meetings: Never   Marital Status: Divorced  Human resources officer Violence: Not At Risk   Fear of Current or Ex-Partner: No   Emotionally Abused: No   Physically Abused: No   Sexually Abused: No     No Known Allergies   Outpatient Medications Prior to Visit  Medication Sig Dispense Refill   folic acid (FOLVITE) 1 MG tablet TAKE 1 TABLET(1 MG) BY MOUTH DAILY 30 tablet 4   hydroxychloroquine (PLAQUENIL) 200 MG tablet TAKE 1 TABLET BY MOUTH TWICE DAILY MONDAY THROUGH FRIDAY ONLY. 120 tablet 0   omeprazole (PRILOSEC) 20 MG capsule Take 20 mg by mouth daily.     prochlorperazine (COMPAZINE) 10 MG tablet TAKE 1 TABLET(10 MG) BY MOUTH EVERY 6 HOURS AS NEEDED FOR NAUSEA OR VOMITING 30 tablet 2   vitamin C (ASCORBIC ACID) 500 MG tablet Take 500 mg by mouth daily.      VITAMIN D PO Take 1 tablet by mouth daily.     No facility-administered medications prior to visit.    Review of Systems  Constitutional:  Negative for chills, fever, malaise/fatigue and weight loss.  HENT:  Negative for hearing loss, sore throat and tinnitus.   Eyes:  Negative for blurred vision and double vision.  Respiratory:  Positive for shortness of breath. Negative for cough, hemoptysis, sputum production, wheezing and stridor.   Cardiovascular:  Negative for chest pain, palpitations, orthopnea, leg swelling and PND.  Gastrointestinal:  Negative for abdominal pain, constipation, diarrhea, heartburn, nausea and vomiting.  Genitourinary:  Negative for dysuria, hematuria and urgency.  Musculoskeletal:  Negative for joint pain and myalgias.  Skin:  Negative for itching and rash.  Neurological:  Negative for dizziness, tingling, weakness and headaches.  Endo/Heme/Allergies:  Negative  for environmental allergies. Does not bruise/bleed easily.  Psychiatric/Behavioral:  Negative for depression. The patient is not nervous/anxious and does not have insomnia.   All other systems reviewed and are negative.   Objective:  Physical Exam Vitals reviewed.  Constitutional:      General: She is not in acute distress.    Appearance: She is well-developed.     Comments: Frail thin.  HENT:     Head: Normocephalic and atraumatic.  Eyes:     General: No scleral icterus.    Conjunctiva/sclera: Conjunctivae normal.     Pupils: Pupils are equal, round, and reactive to light.  Neck:     Vascular: No JVD.     Trachea: No tracheal deviation.  Cardiovascular:     Rate and Rhythm: Normal rate and regular rhythm.     Heart sounds: Normal heart sounds. No murmur heard. Pulmonary:     Effort: Pulmonary effort is normal. No tachypnea, accessory muscle usage or respiratory distress.     Breath sounds: No stridor. No wheezing, rhonchi or rales.     Comments: Absent breath sounds in the bilateral  bases Abdominal:     General: There is no distension.     Palpations: Abdomen is soft.     Tenderness: There is no abdominal tenderness.  Musculoskeletal:        General: No tenderness.     Cervical back: Neck supple.  Lymphadenopathy:     Cervical: No cervical adenopathy.  Skin:    General: Skin is warm and dry.     Capillary Refill: Capillary refill takes less than 2 seconds.     Findings: No rash.  Neurological:     Mental Status: She is alert and oriented to person, place, and time.  Psychiatric:        Behavior: Behavior normal.     Vitals:   04/03/21 1126  BP: 124/64  Pulse: (!) 117  Temp: 97.7 F (36.5 C)  TempSrc: Oral  SpO2: (!) 88%  Weight: 103 lb (46.7 kg)  Height: _0  (1.676 m)   (!) 88% on RA BMI Readings from Last 3 Encounters:  04/03/21 16.62 kg/m  04/02/21 20.23 kg/m  03/29/21 19.92 kg/m   Wt Readings from Last 3 Encounters:  04/03/21 103 lb (46.7 kg)  04/02/21 103 lb 9.6 oz (47 kg)  03/29/21 102 lb (46.3 kg)     CBC    Component Value Date/Time   WBC 8.6 04/02/2021 0931   WBC 8.6 02/10/2021 0118   RBC 2.33 (L) 04/02/2021 1335   RBC 2.52 (L) 04/02/2021 0931   HGB 8.7 (L) 04/02/2021 0931   HCT 27.3 (L) 04/02/2021 0931   PLT 560 (H) 04/02/2021 0931   MCV 108.3 (H) 04/02/2021 0931   MCH 34.5 (H) 04/02/2021 0931   MCHC 31.9 04/02/2021 0931   RDW 19.3 (H) 04/02/2021 0931   LYMPHSABS 0.9 04/02/2021 0931   MONOABS 1.1 (H) 04/02/2021 0931   EOSABS 0.1 04/02/2021 0931   BASOSABS 0.0 04/02/2021 0931     Chest Imaging: Chest x-ray November 2022: Bilateral pleural effusion. The patient's images have been independently reviewed by me.    Pulmonary Functions Testing Results: PFT Results Latest Ref Rng & Units 03/08/2018  FVC-Pre L 2.00  FVC-Predicted Pre % 104  FVC-Post L 1.91  FVC-Predicted Post % 99  Pre FEV1/FVC % % 73  Post FEV1/FCV % % 74  FEV1-Pre L 1.47  FEV1-Predicted Pre % 99  FEV1-Post L 1.42  TLC L 2.74  TLC %  Predicted % 65  RV % Predicted % 61    FeNO:   Pathology:   Echocardiogram:   Heart Catheterization:     Assessment & Plan:     ICD-10-CM   1. S/P thoracentesis  Z98.890 DG Chest 2 View    Cytology - Non PAP;    Protein, total    Glucose, random    Cholesterol, body fluid    Albumin    Amylase    Lactate dehydrogenase    Body fluid culture    Triglycerides, Body Fluid    Gram stain    Gram stain    Triglycerides, Body Fluid    Body fluid culture    Lactate dehydrogenase    Amylase    Albumin    Cholesterol, body fluid    Glucose, random    Protein, total    Cytology - Non PAP;    CANCELED: Amylase    CANCELED: Albumin    CANCELED: Glucose, random    CANCELED: Protein, total    CANCELED: Cholesterol, body fluid    CANCELED: Lactate dehydrogenase    CANCELED: Body fluid cell count with differential    CANCELED: Body fluid culture    CANCELED: Gram stain    CANCELED: pH, body fluid    CANCELED: Triglycerides, Body Fluid    CANCELED: pH, body fluid    CANCELED: pH, body fluid    CANCELED: Body fluid cell count with differential    2. Adenocarcinoma of right lung, stage 4 (HCC)  C34.91     3. S/P pericardial window creation  Z98.890     4. Brain metastases ([place])  C79.31     5. Pleural effusion  J90       Discussion: This is a 65 year old female, bilateral pleural effusion, history of stage IV lung cancer, status post pericardial window for pericardial effusion, brain metastasis currently undergoing therapy with Dr. Earlie Server.  Plan: We talked about relief of symptoms for shortness of breath using therapeutic thoracentesis. Patient agreed to therapeutic thoracentesis today in the office. We will plan for drainage of at least the right sided effusion  Please see separate documentation for procedure note. We discussed risk benefits alternatives today in the office. We also discussed management of recurrent pleural effusion in setting malignancy. The fact  that she may benefit from an indwelling pleural catheter at some point. We also discussed the pertinence of staged thoracentesis.  She needs fluid drained from the left chest but after having the right-sided chest fluid drained in the office she has pretty significant symptoms chest discomfort and shortness of breath so we decided to hold off draining the left side today in the office and we will reschedule her for next week to have the left side drained.   Current Outpatient Medications:    folic acid (FOLVITE) 1 MG tablet, TAKE 1 TABLET(1 MG) BY MOUTH DAILY, Disp: 30 tablet, Rfl: 4   hydroxychloroquine (PLAQUENIL) 200 MG tablet, TAKE 1 TABLET BY MOUTH TWICE DAILY MONDAY THROUGH FRIDAY ONLY., Disp: 120 tablet, Rfl: 0   omeprazole (PRILOSEC) 20 MG capsule, Take 20 mg by mouth daily., Disp: , Rfl:    prochlorperazine (COMPAZINE) 10 MG tablet, TAKE 1 TABLET(10 MG) BY MOUTH EVERY 6 HOURS AS NEEDED FOR NAUSEA OR VOMITING, Disp: 30 tablet, Rfl: 2   vitamin C (ASCORBIC ACID) 500 MG tablet, Take 500 mg by mouth daily., Disp: , Rfl:    VITAMIN D PO, Take 1 tablet by mouth daily., Disp: ,  Rfl:   I spent 66 minutes dedicated to the care of this patient on the date of this encounter to include pre-visit review of records, face-to-face time with the patient discussing conditions above, post visit ordering of testing, clinical documentation with the electronic health record, making appropriate referrals as documented, and communicating necessary findings to members of the patients care team.   Garner Nash, DO [facility] 04/03/2021 6:10 PM

## 2021-04-04 ENCOUNTER — Telehealth: Payer: Self-pay | Admitting: Pulmonary Disease

## 2021-04-04 LAB — AMYLASE, PLEURAL OR PERITONEAL FLUID: Amylase, Fluid: 100 U/L

## 2021-04-04 NOTE — Telephone Encounter (Signed)
Called and spoke with Susan Holder from [facility] lab about the orders that had been placed for pt's pleural fluid from yesterday 12/7. Nothing further needed.

## 2021-04-05 LAB — TRIGLYCERIDES, BODY FLUIDS: Triglycerides, Fluid: 17 mg/dL

## 2021-04-05 LAB — CYTOLOGY - NON PAP

## 2021-04-07 LAB — BODY FLUID CULTURE W GRAM STAIN: Culture: NO GROWTH

## 2021-04-07 LAB — CHOLESTEROL, BODY FLUID: Cholesterol, Fluid: 78 mg/dL

## 2021-04-10 ENCOUNTER — Ambulatory Visit: Payer: Medicare PPO | Admitting: Rheumatology

## 2021-04-10 DIAGNOSIS — M0579 Rheumatoid arthritis with rheumatoid factor of multiple sites without organ or systems involvement: Secondary | ICD-10-CM

## 2021-04-10 DIAGNOSIS — Z79899 Other long term (current) drug therapy: Secondary | ICD-10-CM

## 2021-04-10 DIAGNOSIS — M65331 Trigger finger, right middle finger: Secondary | ICD-10-CM

## 2021-04-10 DIAGNOSIS — M7021 Olecranon bursitis, right elbow: Secondary | ICD-10-CM

## 2021-04-10 DIAGNOSIS — C3491 Malignant neoplasm of unspecified part of right bronchus or lung: Secondary | ICD-10-CM

## 2021-04-10 DIAGNOSIS — Z8639 Personal history of other endocrine, nutritional and metabolic disease: Secondary | ICD-10-CM

## 2021-04-10 DIAGNOSIS — M5030 Other cervical disc degeneration, unspecified cervical region: Secondary | ICD-10-CM

## 2021-04-12 ENCOUNTER — Other Ambulatory Visit: Payer: Self-pay

## 2021-04-12 ENCOUNTER — Encounter: Payer: Self-pay | Admitting: Pulmonary Disease

## 2021-04-12 ENCOUNTER — Ambulatory Visit: Payer: Medicare PPO | Admitting: Pulmonary Disease

## 2021-04-12 VITALS — BP 120/70 | HR 112 | Temp 97.8°F | Ht 66.0 in | Wt 103.0 lb

## 2021-04-12 DIAGNOSIS — C3491 Malignant neoplasm of unspecified part of right bronchus or lung: Secondary | ICD-10-CM | POA: Diagnosis not present

## 2021-04-12 DIAGNOSIS — J90 Pleural effusion, not elsewhere classified: Secondary | ICD-10-CM | POA: Diagnosis not present

## 2021-04-12 NOTE — Progress Notes (Signed)
Synopsis: Referred in December 2022 for pleural effusion by [name], DO  Subjective:   PATIENT ID: Susan Holder GENDER: female DOB: 1956-03-26, MRN: [id number]  Chief Complaint  Patient presents with   Follow-up    This is a 65 year old female, stage IV lung cancer, brain metastasis, pericardial effusion status post pericardial window, history of rheumatoid arthritis RF, ANA and anti-CCP positive.  Patient currently undergoing therapy with Dr. Earlie Server from medical oncology.  Recently saw Dr. Kipp Brood in clinic with bilateral pleural effusion.  Referred today for evaluation of pleural effusion consideration for thoracentesis.  Patient is not on any blood thinners.  She does have some shortness of breath with exertion.  OV 04/12/2021: Here today for repeat office-based thoracentesis.  After last thoracentesis in the office she developed significant amount of pain and drainage.  She recovered from this after resting in the office for a few moments chest x-ray looks fine with reinflation of the lung and no evidence of ex vacuo.  Unfortunately had significant amount of pain after last drainage.  We discussed potentially undergoing a left-sided repeat thoracentesis today.  Unfortunately due to the concern of having the same symptoms she really was unsure about having this done once we set up an had already consented to move forward patient declined having it done.  We discussed the pros and cons of leaving the fluid in the chest.  She ultimately decided on having a Pleurx catheter placed on the right side to see if she could be able to drain this slowly at home without having these significant symptoms.   Past Medical History:  Diagnosis Date   Acid reflux 09/21/2018   Adenocarcinoma of right lung, stage 4 ([place]) 11/17/2017   Allergic rhinitis 08/22/2013   ANA positive 05/29/2016   Aortic atherosclerosis ([place]) 10/23/2020   Brain metastases ([place]) 03/29/2019   Coronary artery calcification  seen on CT scan 10/23/2020   DDD (degenerative disc disease), cervical 05/29/2016   Encounter for antineoplastic chemotherapy 11/17/2017   Encounter for antineoplastic immunotherapy 02/01/2018   Goals of care, counseling/discussion 11/17/2017   High risk medication use 05/29/2016   04/29/2016: ==> plq 200 am & 100 qhs(adeq response).   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) 11/04/2018   Other fatigue 05/29/2016   Pericardial effusion 10/23/2020   Primary malignant neoplasm of bronchus of right lower lobe ([place]) 12/21/2017   Rheumatoid arthritis (HCC)    Rheumatoid arthritis involving multiple sites with positive rheumatoid factor ([place]) 05/29/2016   +RF +ANA +CCP    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   Smoker 05/29/2016   Trigger finger, left ring finger 05/29/2016   Trigger finger, right ring finger 05/29/2016   Vitamin D deficiency 05/29/2016     Family History  Problem Relation Age of Onset   Stroke Mother    Alzheimer's disease Mother    Heart disease Mother    Emphysema Father    Hypertension Brother    Heart attack Maternal Aunt    Heart failure Maternal Grandmother    Hypertension Paternal Grandmother      Past Surgical History:  Procedure Laterality Date   BRONCHIAL NEEDLE ASPIRATION BIOPSY  11/09/2017   Procedure: BRONCHIAL NEEDLE ASPIRATION BIOPSIES;  Surgeon: Marshell Garfinkel, MD;  Location: [facility];  Service: Cardiopulmonary;;  ENDOBRONCHIAL ULTRASOUND Bilateral 11/09/2017   Procedure: ENDOBRONCHIAL ULTRASOUND;  Surgeon: Marshell Garfinkel, MD;  Location: [facility];  Service: Cardiopulmonary;  Laterality: Bilateral;   NASAL SINUS SURGERY     NASAL SINUS SURGERY     THORACENTESIS Right 02/07/2021   Procedure: THORACENTESIS;  Surgeon: Lajuana Matte, MD;  Location: [place];  Service: Thoracic;   Laterality: Right;   TUBAL LIGATION     VIDEO BRONCHOSCOPY  11/09/2017   Procedure: VIDEO BRONCHOSCOPY;  Surgeon: Marshell Garfinkel, MD;  Location: [facility];  Service: Cardiopulmonary;;   XI ROBOTIC ASSISTED PERICARDIAL WINDOW Left 02/07/2021   Procedure: XI ROBOTIC ASSISTED THORACOSCOPY PERICARDIAL WINDOW;  Surgeon: Lajuana Matte, MD;  Location: [place];  Service: Thoracic;  Laterality: Left;    Social History   Socioeconomic History   Marital status: Divorced    Spouse name: Not on file   Number of children: 1   Years of education: Not on file   Highest education level: Not on file  Occupational History   Not on file  Tobacco Use   Smoking status: Former    Packs/day: 0.50    Years: 45.00    Pack years: 22.50    Types: Cigarettes    Quit date: 09/29/2017    Years since quitting: 3.5   Smokeless tobacco: Never  Vaping Use   Vaping Use: Never used  Substance and Sexual Activity   Alcohol use: Not Currently   Drug use: No   Sexual activity: Not Currently  Other Topics Concern   Not on file  Social History Narrative   Not on file   Social Determinants of Health   Financial Resource Strain: Low Risk    Difficulty of Paying Living Expenses: Not hard at all  Food Insecurity: No Food Insecurity   Worried About Charity fundraiser in the Last Year: Never true   [place] in the Last Year: Never true  Transportation Needs: No Transportation Needs   Lack of Transportation (Medical): No   Lack of Transportation (Non-Medical): No  Physical Activity: Inactive   Days of Exercise per Week: 0 days   Minutes of Exercise per Session: 0 min  Stress: No Stress Concern Present   Feeling of Stress : Not at all  Social Connections: Moderately Isolated   Frequency of Communication with Friends and Family: More than three times a week   Frequency of Social Gatherings with Friends and Family: Twice a week   Attends Religious Services: More than 4 times per year   Active  Member of [employer] or Organizations: No   Attends Archivist Meetings: Never   Marital Status: Divorced  Human resources officer Violence: Not At Risk   Fear of Current or Ex-Partner: No   Emotionally Abused: No   Physically Abused: No   Sexually Abused: No     No Known Allergies   Outpatient Medications Prior to Visit  Medication Sig Dispense Refill   folic acid (FOLVITE) 1 MG tablet TAKE 1 TABLET(1 MG) BY MOUTH DAILY 30 tablet 4   hydroxychloroquine (PLAQUENIL) 200 MG tablet TAKE 1 TABLET BY MOUTH TWICE DAILY MONDAY THROUGH FRIDAY ONLY. 120 tablet 0   omeprazole (PRILOSEC) 20 MG capsule Take 20 mg by mouth daily.     prochlorperazine (COMPAZINE) 10 MG tablet TAKE 1 TABLET(10 MG) BY MOUTH EVERY 6 HOURS AS NEEDED FOR NAUSEA OR VOMITING 30 tablet 2   vitamin C (ASCORBIC ACID) 500 MG tablet Take 500 mg by mouth daily.  VITAMIN D PO Take 1 tablet by mouth daily.     No facility-administered medications prior to visit.    Review of Systems  Constitutional:  Positive for malaise/fatigue and weight loss. Negative for chills and fever.  HENT:  Negative for hearing loss, sore throat and tinnitus.   Eyes:  Negative for blurred vision and double vision.  Respiratory:  Positive for shortness of breath. Negative for cough, hemoptysis, sputum production, wheezing and stridor.   Cardiovascular:  Positive for chest pain. Negative for palpitations, orthopnea, leg swelling and PND.  Gastrointestinal:  Negative for abdominal pain, constipation, diarrhea, heartburn, nausea and vomiting.  Genitourinary:  Negative for dysuria, hematuria and urgency.  Musculoskeletal:  Negative for joint pain and myalgias.  Skin:  Negative for itching and rash.  Neurological:  Positive for weakness. Negative for dizziness, tingling and headaches.  Endo/Heme/Allergies:  Negative for environmental allergies. Does not bruise/bleed easily.  Psychiatric/Behavioral:  Negative for depression. The patient is not  nervous/anxious and does not have insomnia.   All other systems reviewed and are negative.   Objective:  Physical Exam Vitals reviewed.  Constitutional:      General: She is not in acute distress.    Appearance: She is well-developed.     Comments: Thin elderly female.  HENT:     Head: Normocephalic and atraumatic.  Eyes:     General: No scleral icterus.    Conjunctiva/sclera: Conjunctivae normal.     Pupils: Pupils are equal, round, and reactive to light.  Neck:     Vascular: No JVD.     Trachea: No tracheal deviation.  Cardiovascular:     Rate and Rhythm: Normal rate and regular rhythm.     Heart sounds: Normal heart sounds. No murmur heard. Pulmonary:     Effort: Pulmonary effort is normal. No tachypnea, accessory muscle usage or respiratory distress.     Breath sounds: No stridor. No wheezing, rhonchi or rales.     Comments: Diminished breath sounds bilaterally in the bases Abdominal:     General: Bowel sounds are normal. There is no distension.     Palpations: Abdomen is soft.     Tenderness: There is no abdominal tenderness.  Musculoskeletal:        General: No tenderness.     Cervical back: Neck supple.     Comments: Thin extremities  Lymphadenopathy:     Cervical: No cervical adenopathy.  Skin:    General: Skin is warm and dry.     Capillary Refill: Capillary refill takes less than 2 seconds.     Findings: No rash.  Neurological:     Mental Status: She is alert and oriented to person, place, and time.  Psychiatric:        Behavior: Behavior normal.     Vitals:   04/12/21 1147  BP: 120/70  Pulse: (!) 112  Temp: 97.8 F (36.6 C)  TempSrc: Oral  SpO2: 90%  Weight: 103 lb (46.7 kg)  Height: 5' 6"  (1.676 m)   90% on RA BMI Readings from Last 3 Encounters:  04/12/21 16.62 kg/m  04/03/21 16.62 kg/m  04/02/21 20.23 kg/m   Wt Readings from Last 3 Encounters:  04/12/21 103 lb (46.7 kg)  04/03/21 103 lb (46.7 kg)  04/02/21 103 lb 9.6 oz (47 kg)      CBC    Component Value Date/Time   WBC 8.6 04/02/2021 0931   WBC 8.6 02/10/2021 0118   RBC 2.33 (L) 04/02/2021 1335   RBC 2.52 (  L) 04/02/2021 0931   HGB 8.7 (L) 04/02/2021 0931   HCT 27.3 (L) 04/02/2021 0931   PLT 560 (H) 04/02/2021 0931   MCV 108.3 (H) 04/02/2021 0931   MCH 34.5 (H) 04/02/2021 0931   MCHC 31.9 04/02/2021 0931   RDW 19.3 (H) 04/02/2021 0931   LYMPHSABS 0.9 04/02/2021 0931   MONOABS 1.1 (H) 04/02/2021 0931   EOSABS 0.1 04/02/2021 0931   BASOSABS 0.0 04/02/2021 0931     Chest Imaging: Chest x-ray November 2022: Bilateral pleural effusion. The patient's images have been independently reviewed by me.    Pulmonary Functions Testing Results: PFT Results Latest Ref Rng & Units 03/08/2018  FVC-Pre L 2.00  FVC-Predicted Pre % 104  FVC-Post L 1.91  FVC-Predicted Post % 99  Pre FEV1/FVC % % 73  Post FEV1/FCV % % 74  FEV1-Pre L 1.47  FEV1-Predicted Pre % 99  FEV1-Post L 1.42  TLC L 2.74  TLC % Predicted % 65  RV % Predicted % 61    FeNO:   Pathology:   Echocardiogram:   Heart Catheterization:     Assessment & Plan:     ICD-10-CM   1. Adenocarcinoma of right lung, stage 4 ([place])  C34.91 Procedural/ Surgical Case Request: INSERTION PLEURAL DRAINAGE CATHETER    2. Pleural effusion  J90 Procedural/ Surgical Case Request: INSERTION PLEURAL DRAINAGE CATHETER      Discussion: This is a 65 year old female, bilateral pleural effusion history of stage IV lung cancer status post pericardial window with pericardial effusion, brain metastasis undergoing therapy with Dr. Earlie Server.  Plan: Patient underwent thoracentesis last week in the office with significant symptoms and chest pain. She presented today to the office and was consented to have a repeat thoracentesis on the left side.  However prior to being able to do this we decided that she wanted to move forward with a Pleurx catheter instead of having repeat thoracentesis. We will plan for a Pleurx to  be placed on 04/26/2021. Patient is agreeable to this plan.  I think that her ability to drain the fluid slowly will prevent her from having repeat chest pains with drainage. At some point we will need to address the left side with consideration for either thoracentesis or Pleurx in the future.  But I think the largest amount of fluid collection is in her right side.  Images of the chest completed today in the office with bedside ultrasound.   Current Outpatient Medications:    folic acid (FOLVITE) 1 MG tablet, TAKE 1 TABLET(1 MG) BY MOUTH DAILY, Disp: 30 tablet, Rfl: 4   hydroxychloroquine (PLAQUENIL) 200 MG tablet, TAKE 1 TABLET BY MOUTH TWICE DAILY MONDAY THROUGH FRIDAY ONLY., Disp: 120 tablet, Rfl: 0   omeprazole (PRILOSEC) 20 MG capsule, Take 20 mg by mouth daily., Disp: , Rfl:    prochlorperazine (COMPAZINE) 10 MG tablet, TAKE 1 TABLET(10 MG) BY MOUTH EVERY 6 HOURS AS NEEDED FOR NAUSEA OR VOMITING, Disp: 30 tablet, Rfl: 2   vitamin C (ASCORBIC ACID) 500 MG tablet, Take 500 mg by mouth daily., Disp: , Rfl:    VITAMIN D PO, Take 1 tablet by mouth daily., Disp: , Rfl:    Garner Nash, DO [facility] 04/12/2021 12:26 PM

## 2021-04-12 NOTE — Progress Notes (Deleted)
Thoracentesis  Procedure Note  MICHI HERRMANN  [id number]  [birth year]  Date:04/12/21  Time:7:08 AM   Provider Performing:Tommi Crepeau L Kayleena Eke   Procedure: Thoracentesis with imaging guidance (29021)  Indication(s) Pleural Effusion  Consent Risks of the procedure as well as the alternatives and risks of each were explained to the patient and/or caregiver.  Consent for the procedure was obtained and is signed in the bedside chart  Anesthesia Topical only with 1% lidocaine    Time Out Verified patient identification, verified procedure, site/side was marked, verified correct patient position, special equipment/implants available, medications/allergies/relevant history reviewed, required imaging and test results available.   Sterile Technique Maximal sterile technique including full sterile barrier drape, hand hygiene, sterile gown, sterile gloves, mask, hair covering, sterile ultrasound probe cover (if used).  Procedure Description Ultrasound was used to identify appropriate pleural anatomy for placement and overlying skin marked.  Area of drainage cleaned and draped in sterile fashion. Lidocaine was used to anesthetize the skin and subcutaneous tissue.  *** cc's of *** appearing fluid was drained from the {LEFT/RIGHT:[id number]} pleural space. Catheter then removed and bandaid applied to site.   Complications/Tolerance {[id number]:12244::"LPNP; patient tolerated the procedure well."} Chest X-ray is ordered to confirm no post-procedural complication.   EBL {EBL:[id number]::"Minimal"}   Specimen(s) {Specimen:[id number]::"Pleural fluid"}

## 2021-04-12 NOTE — Patient Instructions (Addendum)
Thank you for visiting Dr. Valeta Harms at [facility]. Today we recommend the following: Orders Placed This Encounter  Procedures   Procedural/ Surgical Case Request: INSERTION PLEURAL DRAINAGE CATHETER   Plans for pleurex catheter placement on 04/26/2021  Return in about 3 weeks (around 05/03/2021) for with Eric Form, NP, or Dr. Valeta Harms for wound check and suture removal .    Please do your part to reduce the spread of COVID-19.

## 2021-04-13 ENCOUNTER — Other Ambulatory Visit: Payer: Self-pay

## 2021-04-15 ENCOUNTER — Telehealth: Payer: Self-pay

## 2021-04-15 NOTE — Telephone Encounter (Signed)
-----   Message from Garner Nash, DO sent at 04/12/2021 12:21 PM EST ----- Pleurex placement for 04/26/2021, afternoon 1:30/2PM slot would be best  Thanks BLI

## 2021-04-15 NOTE — Telephone Encounter (Signed)
Procedure has already been scheduled for 04/26/2021 at 2 pm at [facility]. Called and confirmed with patient to make sure she has the information and she confirmed. Nothing further needed at this time.

## 2021-04-16 NOTE — Progress Notes (Signed)
[place] OFFICE PROGRESS NOTE  [name], DO No address on file  DIAGNOSIS:  Metastatic non-small cell lung cancer initially diagnosed as stage IIIA (T3, N1/N2, M0) non-small cell lung cancer, adenocarcinoma presented with large right lower lobe lung mass with extension to the right hilum and subcarinal area diagnosed in July 2019.  She has brain metastasis in October 2020.   Biomarker Findings Tumor Mutational Burden - TMB-Intermediate (6 Muts/Mb) Microsatellite status - MS-Stable Genomic Findings For a complete list of the genes assayed, please refer to the Appendix. NRAS Q61R ARAF amplification STK11 G56W KRAS G13D MYCN amplification MCL1 amplification NKX2-1 amplification - equivocal TP53 G245V 7 Disease relevant genes with no reportable alterations: EGFR, ALK, BRAF, MET, ERBB2, RET, ROS1   PRIOR THERAPY: 1) Course of concurrent chemoradiation with weekly carboplatin for AUC of 2 and paclitaxel 45 mg/M2.  Status post 7 cycles.  Last dose was giving 01/11/2018. 2) Consolidation treatment with immunotherapy with Imfinzi (Durvalumab) 10 mg/KG every 2 weeks.  First dose February 09, 2018.  Status post 19 cycles. 3) status post stereotactic body radiotherapy to the enlarging right supraclavicular lymphadenopathy under the care of Dr. Lisbeth Renshaw. 4) SRS to multiple brain metastasis under the care of Dr. Lisbeth Renshaw.  CURRENT THERAPY: Systemic chemotherapy with carboplatin for AUC of 5, Alimta 500 mg/M2 and Keytruda 200 mg IV every 3 weeks.  First dose 08/16/2019.  Status post 29 cycles.  Starting from cycle #5 the patient is on maintenance treatment with Alimta and Keytruda every 3 weeks. Alimta was reduced to 400 mg/m2 starting from cycle #21.  INTERVAL HISTORY: Susan Holder 65 y.o. female returns to clinic today for a follow-up visit.  The patient is feeling poorly today with fatigue, generalized weakness, and continued weight loss.  The patient is in a wheelchair  today which is unusual for her.  She does not appear as her usual self.  The patient previously underwent a paracardial window thoracentesis under the care of Dr. Kipp Brood.  The patient has recurrent pleural effusions and she is considering Pleurx catheter placement under the care of Dr. Valeta Harms on 04/26/2021.  She states that she is thought about this and she does not want to proceed with this.  The patient was supposed to undergo a restaging CT scan prior to her appointment today, but the patient does not recall having this scheduled.  It appears that she was scheduled on 04/19/2021 but no showed the visit.  She is currently undergoing chemotherapy and immunotherapy with Alimta at a reduced dose and Keytruda.  She has been having some ongoing issues with anemia.  The patient denies any abnormal bleeding or bruising including epistaxis, gingival bleeding, hemoptysis, hematemesis, melena, or hematochezia.   The patient denies any calcium supplements or multivitamins.  She has been drinking Ensure at least twice a day due to her decreased appetite and weight loss.  She lost another 5 pound since her last appointment.  She is scheduled to see member the nutritionist team while in the infusion room today.  The patient is up-to-date on her routine brain MRI for her history of metastatic disease to the brain.  This was performed last month which was stable.  She denies any fever, chills, or night sweats.  She denies any chest pain or hemoptysis. She reports her baseline chronic dry cough.  Denies any nausea, vomiting, diarrhea, or constipation.  Denies any headache or visual changes.  Denies any rashes or skin changes.  She is here today  for evaluation to review her scan results before starting cycle #30  MEDICAL HISTORY: Past Medical History:  Diagnosis Date   Acid reflux 09/21/2018   Adenocarcinoma of right lung, stage 4 ([place]) 11/17/2017   Allergic rhinitis 08/22/2013   ANA positive 05/29/2016   Aortic  atherosclerosis ([place]) 10/23/2020   Brain metastases ([place]) 03/29/2019   Coronary artery calcification seen on CT scan 10/23/2020   DDD (degenerative disc disease), cervical 05/29/2016   Encounter for antineoplastic chemotherapy 11/17/2017   Encounter for antineoplastic immunotherapy 02/01/2018   Goals of care, counseling/discussion 11/17/2017   High risk medication use 05/29/2016   04/29/2016: ==> plq 200 am & 100 qhs(adeq response).   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[place]) 11/04/2018   Other fatigue 05/29/2016   Pericardial effusion 10/23/2020   Primary malignant neoplasm of bronchus of right lower lobe ([place]) 12/21/2017   Rheumatoid arthritis (HCC)    Rheumatoid arthritis involving multiple sites with positive rheumatoid factor ([place]) 05/29/2016   +RF +ANA +CCP    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   Smoker 05/29/2016   Trigger finger, left ring finger 05/29/2016   Trigger finger, right ring finger 05/29/2016   Vitamin D deficiency 05/29/2016    ALLERGIES:  has No Known Allergies.  MEDICATIONS:  Current Outpatient Medications  Medication Sig Dispense Refill   folic acid (FOLVITE) 1 MG tablet TAKE 1 TABLET(1 MG) BY MOUTH DAILY 30 tablet 4   hydroxychloroquine (PLAQUENIL) 200 MG tablet TAKE 1 TABLET BY MOUTH TWICE DAILY MONDAY THROUGH FRIDAY ONLY. 120 tablet 0   omeprazole (PRILOSEC) 20 MG capsule Take 20 mg by mouth daily.     prochlorperazine (COMPAZINE) 10 MG tablet TAKE 1 TABLET(10 MG) BY MOUTH EVERY 6 HOURS AS NEEDED FOR NAUSEA OR VOMITING 30 tablet 2   vitamin C (ASCORBIC ACID) 500 MG tablet Take 500 mg by mouth daily.     VITAMIN D PO Take 1 tablet by mouth daily.     No current facility-administered medications for this visit.    SURGICAL HISTORY:  Past Surgical History:  Procedure Laterality Date   BRONCHIAL NEEDLE  ASPIRATION BIOPSY  11/09/2017   Procedure: BRONCHIAL NEEDLE ASPIRATION BIOPSIES;  Surgeon: Marshell Garfinkel, MD;  Location: [facility];  Service: Cardiopulmonary;;   ENDOBRONCHIAL ULTRASOUND Bilateral 11/09/2017   Procedure: ENDOBRONCHIAL ULTRASOUND;  Surgeon: Marshell Garfinkel, MD;  Location: [facility];  Service: Cardiopulmonary;  Laterality: Bilateral;   NASAL SINUS SURGERY     NASAL SINUS SURGERY     THORACENTESIS Right 02/07/2021   Procedure: THORACENTESIS;  Surgeon: Lajuana Matte, MD;  Location: [facility];  Service: Thoracic;  Laterality: Right;   TUBAL LIGATION     VIDEO BRONCHOSCOPY  11/09/2017   Procedure: VIDEO BRONCHOSCOPY;  Surgeon: Marshell Garfinkel, MD;  Location: [facility];  Service: Cardiopulmonary;;   XI ROBOTIC ASSISTED PERICARDIAL WINDOW Left 02/07/2021   Procedure: XI ROBOTIC ASSISTED THORACOSCOPY PERICARDIAL WINDOW;  Surgeon: Lajuana Matte, MD;  Location: [facility];  Service: Thoracic;  Laterality: Left;    REVIEW OF SYSTEMS:   Review of Systems  Constitutional: Positive for fatigue, weakness, decreased appetite, weight loss.  Negative for chills and fever.  HENT: Negative for mouth sores, nosebleeds, sore throat and trouble swallowing.   Eyes: Negative for eye problems and icterus.  Respiratory: Positive for  shortness of breath.  Positive for chronic cough.  Negative for hemoptysis and wheezing.   Cardiovascular: Negative for chest pain and leg swelling.  Gastrointestinal: Negative for abdominal pain, constipation, diarrhea, nausea and vomiting.  Genitourinary: Negative for bladder incontinence, difficulty urinating, dysuria, frequency and hematuria.   Musculoskeletal: Negative for back pain, gait problem, neck pain and neck stiffness.  Skin: Negative for itching and rash.  Neurological: Negative for dizziness, extremity weakness, gait problem, headaches, light-headedness and seizures.  Hematological: Negative for adenopathy. Does not bruise/bleed easily.   Psychiatric/Behavioral: Negative for confusion, depression and sleep disturbance. The patient is not nervous/anxious.     PHYSICAL EXAMINATION:  Blood pressure (!) 134/56, pulse (!) 116, temperature 97.6 F (36.4 C), temperature source Tympanic, resp. rate 18, height 5' 6"  (1.676 m), weight 98 lb 6.4 oz (44.6 kg), last menstrual period 04/28/2000, SpO2 99 %.  ECOG PERFORMANCE STATUS: 2-3  Physical Exam  Constitutional: Oriented to person, place, and time and cachectic appearing female and in no distress.  HENT:  Head: Normocephalic and atraumatic.  Mouth/Throat: Oropharynx is clear and moist. No oropharyngeal exudate.  Eyes: Conjunctivae are normal. Right eye exhibits no discharge. Left eye exhibits no discharge. No scleral icterus.  Neck: Normal range of motion. Neck supple.  Cardiovascular: Tachycardic regular rhythm, normal heart sounds and intact distal pulses.   Pulmonary/Chest: Effort normal and breath sounds normal. No respiratory distress. No wheezes. No rales.  Abdominal: Soft. Bowel sounds are normal. Exhibits no distension and no mass. There is no tenderness.  Musculoskeletal: Normal range of motion. Exhibits no edema.  Lymphadenopathy:    No cervical adenopathy.  Neurological: Alert and oriented to person, place, and time. Exhibits muscle wasting.  The patient is examined in the wheelchair.   Skin: Skin is warm and dry. No rash noted. Not diaphoretic. No erythema. No pallor.  Psychiatric: Mood, memory and judgment normal.  Vitals reviewed.  LABORATORY DATA: Lab Results  Component Value Date   WBC 9.8 04/23/2021   HGB 7.4 (L) 04/23/2021   HCT 22.6 (L) 04/23/2021   MCV 109.7 (H) 04/23/2021   PLT 631 (H) 04/23/2021      Chemistry      Component Value Date/Time   NA 137 04/23/2021 0910   K 3.4 (L) 04/23/2021 0910   CL 102 04/23/2021 0910   CO2 26 04/23/2021 0910   BUN 12 04/23/2021 0910   CREATININE 1.26 (H) 04/23/2021 0910   CREATININE 0.80 07/24/2017 1438       Component Value Date/Time   CALCIUM 10.8 (H) 04/23/2021 0910   ALKPHOS 58 04/23/2021 0910   AST 27 04/23/2021 0910   ALT 10 04/23/2021 0910   BILITOT 0.3 04/23/2021 0910       RADIOGRAPHIC STUDIES:  DG Chest 2 View  Result Date: 04/03/2021 CLINICAL DATA:  Status post thoracentesis EXAM: CHEST - 2 VIEW COMPARISON:  03/29/2021 FINDINGS: Slight interval reduction in volume of a small right pleural effusion, with unchanged, bandlike scarring and fibrosis of the right midlung. No pneumothorax. Unchanged moderate left pleural effusion. The heart and mediastinum are unremarkable. Disc degenerative disease of the thoracic spine. IMPRESSION: 1. Slight interval reduction in volume of a small right pleural effusion, with unchanged, bandlike scarring and fibrosis of the right midlung. No pneumothorax. 2. Unchanged moderate left pleural effusion. Electronically Signed   By: Delanna Ahmadi M.D.   On: 04/03/2021 12:39   DG Chest 2 View  Result Date: 03/29/2021 CLINICAL DATA:  65 year old female with lung cancer. EXAM: CHEST -  2 VIEW COMPARISON:  Portable chest 02/10/2021 and earlier. FINDINGS: Chronic architectural distortion about the right hilum. Stable mediastinal contours. Small to moderate left pleural effusion has significantly progressed from last exam. Ongoing right lung base effusion appears more stable. No pneumothorax. Streaky increased infrahilar lung opacity is nonspecific. Visualized tracheal air column is within normal limits. No acute osseous abnormality identified. Negative visible bowel gas. Right chest wall subcutaneous gas has resolved. IMPRESSION: 1. Increased left lung base effusion since October. And nonspecific increased streaky bibasilar lung opacity. Top differential considerations include atelectasis, post radiation change, infection. 2. Right lung base effusion is stable, along with right upper lobe architectural distortion. Electronically Signed   By: Genevie Ann M.D.   On: 03/29/2021  11:36     ASSESSMENT/PLAN:  This is a very pleasant 65 year old African American female with metastatic non-small cell lung cancer, adenocarcinoma initially diagnosed as IIIA in July 2019. She had evidence of metastatic disease with brain metastases in October 2020. She has no actionable mutations.    She is status post 7 cycles of concurrent chemoradiation with carboplatin for an AUC of 2 and paclitaxel 45 mg/m2. She had a partial response.  She then underwent consolidation immunotherapy with imfinzi. She is status post 18 cycles.   She completed SBRT to the right supraclavicular lymph node.  She had evidence of multiple brain metastases in October 2020. She underwent SRS treatment to these lesions under the care of Dr. Lisbeth Renshaw.   She currently is undergoing systemic chemotherapy with carboplatin for an AUC of 5, Alimta 500 mg/m2 and Keytruda 200 mg IV every 3 weeks. She is status post 29 cycles. Starting from cycle #5 she started maintenance Alimta and Keytruda. Starting from cycle #21, Dr. Julien Nordmann reduced her alimta to 400 mg/m2 due to fatigue.  The patient was post to have a restaging CT scan prior to this appointment.  The patient did not show up for her scan.  We will work on getting it rescheduled for later this week.  The patient was seen with Dr. Julien Nordmann today.  Labs were reviewed.  Her hemoglobin is low at 7.3.  The patient denies any abnormal bleeding or bruising.  She had some iron studies performed at a recent visit which shows reticulocytosis and elevated ferritin.  The patient does not appear as her usual self today and looks very fatigued and weak.  She is in a wheelchair which is unusual for her.  We will not proceed with treatment today.  We will delay her treatment by 1 week and allow time for her to get her restaging CT scan and to arrange for 2 units of blood as well as 1 L of fluids.  We will arrange for her to receive 1 unit of blood today and IV fluid.  Per Dr. Julien Nordmann, okay  to increase her rate but administered 20 meq of Lasix.  We will arrange for her to receive her second unit of blood tomorrow.  We will tentatively arrange for her to have a appointment next week for evaluation, repeat blood work, and to review her scan results before considering starting her next cycle of treatment.  She is scheduled to meet a member the nutritionist team while in the infusion room today.  The patient was advised to call immediately if she has any concerning symptoms in the interval. The patient voices understanding of current disease status and treatment options and is in agreement with the current care plan. All questions were answered. The  patient knows to call the clinic with any problems, questions or concerns. We can certainly see the patient much sooner if necessary          Orders Placed This Encounter  Procedures   Care order/instruction    Transfuse Parameters    Standing Status:   Future    Standing Expiration Date:   04/23/2022   Informed Consent Details: Physician/Practitioner Attestation; Transcribe to consent form and obtain patient signature    Standing Status:   Future    Standing Expiration Date:   04/23/2022    Order Specific Question:   Physician/Practitioner attestation of informed consent for blood and or blood product transfusion    Answer:   I, the physician/practitioner, attest that I have discussed with the patient the benefits, risks, side effects, alternatives, likelihood of achieving goals and potential problems during recovery for the procedure that I have provided informed consent.    Order Specific Question:   Product(s)    Answer:   All Product(s)   Type and screen         Standing Status:   Future    Number of Occurrences:   1    Standing Expiration Date:   04/23/2022   Prepare RBC (crossmatch)    Standing Status:   Standing    Number of Occurrences:   1    Order Specific Question:   # of Units    Answer:   1 unit    Order  Specific Question:   Transfusion Indications    Answer:   Symptomatic Anemia    Order Specific Question:   Number of Units to Keep Ahead    Answer:   NO units ahead    Order Specific Question:   If emergent release call blood bank    Answer:   Not emergent release   Sample to Blood Bank    Standing Status:   Standing    Number of Occurrences:   2    Standing Expiration Date:   04/23/2022      Susan Sos Lavaughn Haberle, PA-C 04/23/21  ADDENDUM: Hematology/Oncology Attending: I had a face-to-face encounter with the patient today.  I reviewed her record, lab and recommended her care plan.  This is a very pleasant 65 years old African-American female with metastatic non-small cell lung cancer initially diagnosed as stage IIIa in July 2019 and had evidence for metastatic disease to the brain in October 2020.  The patient has no actionable mutations.  She is status post a course of concurrent chemoradiation with carboplatin and paclitaxel followed by 18 cycles of consolidation treatment with Imfinzi every 2 weeks before it was discontinued secondary to disease progression in the brain. The patient treated with SRS to the brain metastasis followed by systemic chemotherapy initially with induction carboplatin, Alimta and Keytruda for 4 cycles followed by maintenance treatment with Alimta and Keytruda for total of 29 cycles of chemotherapy. Was supposed to start cycle #30 today but the patient is complaining of increasing fatigue and weakness as well as dehydration and anemia.  She was also supposed to have repeat imaging studies with CT scan of the chest, abdomen and pelvis before this visit but she missed her appointment. I recommended for the patient to delay the start of cycle number 30 x 1 week until we have the imaging studies done to rule out any disease progression. For the chemotherapy-induced anemia, will arrange for the patient to receive 2 units of PRBCs transfusion today. For the  tachycardia  and dehydration, will give the patient IV fluids today with normal saline. For the weight loss and lack of appetite she will meet with the dietitian at the cancer center for evaluation and recommendation regarding her The total time spent in the appointment was 40 minutes. Disclaimer: This note was dictated with voice recognition software. Similar sounding words can inadvertently be transcribed and may be missed upon review. Eilleen Kempf, MD 04/23/21

## 2021-04-19 ENCOUNTER — Ambulatory Visit (HOSPITAL_COMMUNITY): Payer: Medicare PPO

## 2021-04-23 ENCOUNTER — Inpatient Hospital Stay: Payer: Medicare PPO

## 2021-04-23 ENCOUNTER — Other Ambulatory Visit: Payer: Self-pay

## 2021-04-23 ENCOUNTER — Other Ambulatory Visit: Payer: Medicare PPO

## 2021-04-23 ENCOUNTER — Inpatient Hospital Stay: Payer: Medicare PPO | Admitting: Physician Assistant

## 2021-04-23 ENCOUNTER — Inpatient Hospital Stay: Payer: Medicare PPO | Admitting: Nutrition

## 2021-04-23 VITALS — BP 134/56 | HR 116 | Temp 97.6°F | Resp 18 | Ht 66.0 in | Wt 98.4 lb

## 2021-04-23 DIAGNOSIS — D649 Anemia, unspecified: Secondary | ICD-10-CM

## 2021-04-23 DIAGNOSIS — C7931 Secondary malignant neoplasm of brain: Secondary | ICD-10-CM | POA: Diagnosis not present

## 2021-04-23 DIAGNOSIS — C3491 Malignant neoplasm of unspecified part of right bronchus or lung: Secondary | ICD-10-CM

## 2021-04-23 DIAGNOSIS — Z5112 Encounter for antineoplastic immunotherapy: Secondary | ICD-10-CM | POA: Diagnosis not present

## 2021-04-23 DIAGNOSIS — J90 Pleural effusion, not elsewhere classified: Secondary | ICD-10-CM | POA: Diagnosis not present

## 2021-04-23 DIAGNOSIS — C3431 Malignant neoplasm of lower lobe, right bronchus or lung: Secondary | ICD-10-CM | POA: Diagnosis not present

## 2021-04-23 DIAGNOSIS — R634 Abnormal weight loss: Secondary | ICD-10-CM | POA: Diagnosis not present

## 2021-04-23 DIAGNOSIS — Z5111 Encounter for antineoplastic chemotherapy: Secondary | ICD-10-CM | POA: Diagnosis not present

## 2021-04-23 DIAGNOSIS — R701 Abnormal plasma viscosity: Secondary | ICD-10-CM | POA: Diagnosis not present

## 2021-04-23 LAB — CMP (CANCER CENTER ONLY)
ALT: 10 U/L (ref 0–44)
AST: 27 U/L (ref 15–41)
Albumin: 3 g/dL — ABNORMAL LOW (ref 3.5–5.0)
Alkaline Phosphatase: 58 U/L (ref 38–126)
Anion gap: 9 (ref 5–15)
BUN: 12 mg/dL (ref 8–23)
CO2: 26 mmol/L (ref 22–32)
Calcium: 10.8 mg/dL — ABNORMAL HIGH (ref 8.9–10.3)
Chloride: 102 mmol/L (ref 98–111)
Creatinine: 1.26 mg/dL — ABNORMAL HIGH (ref 0.44–1.00)
GFR, Estimated: 47 mL/min — ABNORMAL LOW (ref >60)
Glucose, Bld: 108 mg/dL — ABNORMAL HIGH (ref 70–99)
Potassium: 3.4 mmol/L — ABNORMAL LOW (ref 3.5–5.1)
Sodium: 137 mmol/L (ref 135–145)
Total Bilirubin: 0.3 mg/dL (ref 0.3–1.2)
Total Protein: 6.9 g/dL (ref 6.5–8.1)

## 2021-04-23 LAB — CBC WITH DIFFERENTIAL (CANCER CENTER ONLY)
Abs Immature Granulocytes: 0.06 10*3/uL (ref 0.00–0.07)
Basophils Absolute: 0 10*3/uL (ref 0.0–0.1)
Basophils Relative: 0 %
Eosinophils Absolute: 0 10*3/uL (ref 0.0–0.5)
Eosinophils Relative: 0 %
HCT: 22.6 % — ABNORMAL LOW (ref 36.0–46.0)
Hemoglobin: 7.4 g/dL — ABNORMAL LOW (ref 12.0–15.0)
Immature Granulocytes: 1 %
Lymphocytes Relative: 7 %
Lymphs Abs: 0.7 10*3/uL (ref 0.7–4.0)
MCH: 35.9 pg — ABNORMAL HIGH (ref 26.0–34.0)
MCHC: 32.7 g/dL (ref 30.0–36.0)
MCV: 109.7 fL — ABNORMAL HIGH (ref 80.0–100.0)
Monocytes Absolute: 1.2 10*3/uL — ABNORMAL HIGH (ref 0.1–1.0)
Monocytes Relative: 12 %
Neutro Abs: 7.8 10*3/uL — ABNORMAL HIGH (ref 1.7–7.7)
Neutrophils Relative %: 80 %
Platelet Count: 631 10*3/uL — ABNORMAL HIGH (ref 150–400)
RBC: 2.06 MIL/uL — ABNORMAL LOW (ref 3.87–5.11)
RDW: 19 % — ABNORMAL HIGH (ref 11.5–15.5)
WBC Count: 9.8 10*3/uL (ref 4.0–10.5)
nRBC: 0 % (ref 0.0–0.2)

## 2021-04-23 LAB — SAMPLE TO BLOOD BANK

## 2021-04-23 LAB — PREPARE RBC (CROSSMATCH)

## 2021-04-23 LAB — TSH: TSH: 2.568 u[IU]/mL (ref 0.308–3.960)

## 2021-04-23 MED ORDER — DIPHENHYDRAMINE HCL 25 MG PO CAPS
25.0000 mg | ORAL_CAPSULE | Freq: Once | ORAL | Status: AC
  Administered 2021-04-23: 11:00:00 25 mg via ORAL
  Filled 2021-04-23: qty 1

## 2021-04-23 MED ORDER — ACETAMINOPHEN 325 MG PO TABS
650.0000 mg | ORAL_TABLET | Freq: Once | ORAL | Status: AC
  Administered 2021-04-23: 11:00:00 650 mg via ORAL
  Filled 2021-04-23: qty 2

## 2021-04-23 MED ORDER — SODIUM CHLORIDE 0.9 % IV SOLN: Freq: Once | INTRAVENOUS | Status: AC

## 2021-04-23 MED ORDER — FUROSEMIDE 10 MG/ML IJ SOLN
20.0000 mg | Freq: Once | INTRAMUSCULAR | Status: AC
  Administered 2021-04-23: 12:00:00 20 mg via INTRAVENOUS
  Filled 2021-04-23: qty 2

## 2021-04-23 NOTE — Progress Notes (Signed)
Ok to run blood at 250 per [employer], [place]

## 2021-04-23 NOTE — Patient Instructions (Signed)
Blood Transfusion, Adult, Care After This sheet gives you information about how to care for yourself after your procedure. Your doctor may also give you more specific instructions. If you have problems or questions, contact your doctor. What can I expect after the procedure? After the procedure, it is common to have: Bruising and soreness at the IV site. A headache. Follow these instructions at home: Insertion site care   Follow instructions from your doctor about how to take care of your insertion site. This is where an IV tube was put into your vein. Make sure you: Wash your hands with soap and water before and after you change your bandage (dressing). If you cannot use soap and water, use hand sanitizer. Change your bandage as told by your doctor. Check your insertion site every day for signs of infection. Check for: Redness, swelling, or pain. Bleeding from the site. Warmth. Pus or a bad smell. General instructions Take over-the-counter and prescription medicines only as told by your doctor. Rest as told by your doctor. Go back to your normal activities as told by your doctor. Keep all follow-up visits as told by your doctor. This is important. Contact a doctor if: You have itching or red, swollen areas of skin (hives). You feel worried or nervous (anxious). You feel weak after doing your normal activities. You have redness, swelling, warmth, or pain around the insertion site. You have blood coming from the insertion site, and the blood does not stop with pressure. You have pus or a bad smell coming from the insertion site. Get help right away if: You have signs of a serious reaction. This may be coming from an allergy or the body's defense system (immune system). Signs include: Trouble breathing or shortness of breath. Swelling of the face or feeling warm (flushed). Fever or chills. Head, chest, or back pain. Dark pee (urine) or blood in the pee. Widespread rash. Fast  heartbeat. Feeling dizzy or light-headed. You may receive your blood transfusion in an outpatient setting. If so, you will be told whom to contact to report any reactions. These symptoms may be an emergency. Do not wait to see if the symptoms will go away. Get medical help right away. Call your local emergency services (911 in the [place] Do not drive yourself to the hospital. Summary Bruising and soreness at the IV site are common. Check your insertion site every day for signs of infection. Rest as told by your doctor. Go back to your normal activities as told by your doctor. Get help right away if you have signs of a serious reaction. This information is not intended to replace advice given to you by your health care provider. Make sure you discuss any questions you have with your health care provider. Document Revised: 08/09/2020 Document Reviewed: 10/07/2018 Elsevier Patient Education  2022 [employer] Inc[employer]  

## 2021-04-23 NOTE — Progress Notes (Signed)
65 year old female diagnosed with metastatic lung cancer and metastases to the brain.  She is followed by Dr. Julien Nordmann and receiving Alimta and Beryle Flock.  Past medical history includes acid reflux, CAD, DDD, rheumatoid arthritis, vitamin D deficiency, tobacco.  Medications include Folvite, Prilosec, Compazine, vitamin C, vitamin D.  Labs include potassium 3.4, glucose 108, creatinine 1.26, albumin 3.0.  Height: 5 feet 6 inches. Weight: 98.4 pounds. Patient weighed 113.7 pounds May 31. BMI: 15.88. ECOG: 2-3.  Patient reports increased fatigue.  She has early satiety.  States she vomits if she overeats. She is drinking 2 cartons of original Ensure a day.  Reports she has to thin this down with water.  She is only drinking two 8 ounce bottles of water a day.  Her infusion was canceled today and she is receiving IV fluids.  Nutrition focused physical exam was deferred.  Nutrition diagnosis: Unintended weight loss related to inadequate oral intake as evidenced by 13% weight loss over 6 months.  Intervention: Patient educated to consume smaller more frequent high-calorie snacks. Recommended patient change Ensure to ensure complete or equivalent and increase 3 times daily. Continue to thin Ensure with water as needed. Try to increase fluid intake using beverages with calories in them such as juice or milk. Encourage patient to try cold foods and take nausea medications to prevent nausea.  Monitoring, evaluation, goals: Patient will tolerate increased calories and protein to minimize further weight loss and improve quality of life.  Next visit: Tuesday, February 7 during infusion.  **Disclaimer: This note was dictated with voice recognition software. Similar sounding words can inadvertently be transcribed and this note may contain transcription errors which may not have been corrected upon publication of note.**

## 2021-04-24 ENCOUNTER — Other Ambulatory Visit: Payer: Self-pay

## 2021-04-24 ENCOUNTER — Encounter (HOSPITAL_COMMUNITY): Payer: Self-pay | Admitting: Oncology

## 2021-04-24 ENCOUNTER — Emergency Department (HOSPITAL_COMMUNITY): Payer: Medicare PPO

## 2021-04-24 ENCOUNTER — Inpatient Hospital Stay (HOSPITAL_BASED_OUTPATIENT_CLINIC_OR_DEPARTMENT_OTHER): Payer: Medicare PPO | Admitting: Physician Assistant

## 2021-04-24 ENCOUNTER — Inpatient Hospital Stay (HOSPITAL_BASED_OUTPATIENT_CLINIC_OR_DEPARTMENT_OTHER): Payer: Medicare PPO

## 2021-04-24 ENCOUNTER — Emergency Department (HOSPITAL_COMMUNITY)
Admission: EM | Admit: 2021-04-24 | Discharge: 2021-04-24 | Disposition: A | Discharge status: Home / Self Care | Payer: Medicare PPO | Attending: Emergency Medicine | Admitting: Emergency Medicine

## 2021-04-24 DIAGNOSIS — Z87891 Personal history of nicotine dependence: Secondary | ICD-10-CM | POA: Diagnosis not present

## 2021-04-24 DIAGNOSIS — R0902 Hypoxemia: Secondary | ICD-10-CM

## 2021-04-24 DIAGNOSIS — R0602 Shortness of breath: Secondary | ICD-10-CM | POA: Diagnosis not present

## 2021-04-24 DIAGNOSIS — Z85118 Personal history of other malignant neoplasm of bronchus and lung: Secondary | ICD-10-CM | POA: Insufficient documentation

## 2021-04-24 DIAGNOSIS — R000 Tachycardia, unspecified: Secondary | ICD-10-CM | POA: Insufficient documentation

## 2021-04-24 DIAGNOSIS — Z8579 Personal history of other malignant neoplasms of lymphoid, hematopoietic and related tissues: Secondary | ICD-10-CM | POA: Diagnosis not present

## 2021-04-24 DIAGNOSIS — D649 Anemia, unspecified: Secondary | ICD-10-CM

## 2021-04-24 DIAGNOSIS — C3491 Malignant neoplasm of unspecified part of right bronchus or lung: Secondary | ICD-10-CM

## 2021-04-24 DIAGNOSIS — C3490 Malignant neoplasm of unspecified part of unspecified bronchus or lung: Secondary | ICD-10-CM | POA: Diagnosis not present

## 2021-04-24 DIAGNOSIS — J90 Pleural effusion, not elsewhere classified: Secondary | ICD-10-CM | POA: Diagnosis not present

## 2021-04-24 DIAGNOSIS — R9431 Abnormal electrocardiogram [ECG] [EKG]: Secondary | ICD-10-CM | POA: Diagnosis not present

## 2021-04-24 LAB — COMPREHENSIVE METABOLIC PANEL
ALT: 11 U/L (ref 0–44)
AST: 30 U/L (ref 15–41)
Albumin: 2.6 g/dL — ABNORMAL LOW (ref 3.5–5.0)
Alkaline Phosphatase: 57 U/L (ref 38–126)
Anion gap: 9 (ref 5–15)
BUN: 13 mg/dL (ref 8–23)
CO2: 28 mmol/L (ref 22–32)
Calcium: 10.4 mg/dL — ABNORMAL HIGH (ref 8.9–10.3)
Chloride: 99 mmol/L (ref 98–111)
Creatinine, Ser: 1.29 mg/dL — ABNORMAL HIGH (ref 0.44–1.00)
GFR, Estimated: 45 mL/min — ABNORMAL LOW (ref >60)
Glucose, Bld: 101 mg/dL — ABNORMAL HIGH (ref 70–99)
Potassium: 3.4 mmol/L — ABNORMAL LOW (ref 3.5–5.1)
Sodium: 136 mmol/L (ref 135–145)
Total Bilirubin: 0.4 mg/dL (ref 0.3–1.2)
Total Protein: 7 g/dL (ref 6.5–8.1)

## 2021-04-24 LAB — CBC WITH DIFFERENTIAL/PLATELET
Abs Immature Granulocytes: 0.08 10*3/uL — ABNORMAL HIGH (ref 0.00–0.07)
Basophils Absolute: 0 10*3/uL (ref 0.0–0.1)
Basophils Relative: 0 %
Eosinophils Absolute: 0 10*3/uL (ref 0.0–0.5)
Eosinophils Relative: 0 %
HCT: 30.1 % — ABNORMAL LOW (ref 36.0–46.0)
Hemoglobin: 10 g/dL — ABNORMAL LOW (ref 12.0–15.0)
Immature Granulocytes: 1 %
Lymphocytes Relative: 7 %
Lymphs Abs: 0.8 10*3/uL (ref 0.7–4.0)
MCH: 33.7 pg (ref 26.0–34.0)
MCHC: 33.2 g/dL (ref 30.0–36.0)
MCV: 101.3 fL — ABNORMAL HIGH (ref 80.0–100.0)
Monocytes Absolute: 1.1 10*3/uL — ABNORMAL HIGH (ref 0.1–1.0)
Monocytes Relative: 11 %
Neutro Abs: 8.3 10*3/uL — ABNORMAL HIGH (ref 1.7–7.7)
Neutrophils Relative %: 81 %
Platelets: 526 10*3/uL — ABNORMAL HIGH (ref 150–400)
RBC: 2.97 MIL/uL — ABNORMAL LOW (ref 3.87–5.11)
RDW: 24.1 % — ABNORMAL HIGH (ref 11.5–15.5)
WBC: 10.4 10*3/uL (ref 4.0–10.5)
nRBC: 0 % (ref 0.0–0.2)

## 2021-04-24 LAB — BLOOD GAS, VENOUS
Acid-Base Excess: 4.9 mmol/L — ABNORMAL HIGH (ref 0.0–2.0)
Bicarbonate: 27 mmol/L (ref 20.0–28.0)
FIO2: 21
O2 Saturation: 99.8 %
Patient temperature: 98.6
pCO2, Ven: 31 mmHg — ABNORMAL LOW (ref 44.0–60.0)
pH, Ven: 7.548 — ABNORMAL HIGH (ref 7.250–7.430)
pO2, Ven: 169 mmHg — ABNORMAL HIGH (ref 32.0–45.0)

## 2021-04-24 LAB — TYPE AND SCREEN
ABO/RH(D): O POS
ABO/RH(D): O POS
Antibody Screen: NEGATIVE
Antibody Screen: NEGATIVE
Unit division: 0

## 2021-04-24 LAB — BPAM RBC
Blood Product Expiration Date: 202301212359
ISSUE DATE / TIME: 202212271128
Unit Type and Rh: 5100

## 2021-04-24 MED ORDER — DIPHENHYDRAMINE HCL 50 MG/ML IJ SOLN: 25.0000 mg | Freq: Once | INTRAMUSCULAR | Status: DC

## 2021-04-24 MED ORDER — ACETAMINOPHEN 325 MG PO TABS: 650.0000 mg | ORAL_TABLET | Freq: Once | ORAL | Status: DC

## 2021-04-24 MED ORDER — IOHEXOL 350 MG/ML SOLN
50.0000 mL | Freq: Once | INTRAVENOUS | Status: AC | PRN
  Administered 2021-04-24: 14:00:00 50 mL via INTRAVENOUS

## 2021-04-24 MED ORDER — SODIUM CHLORIDE 0.9% IV SOLUTION: 250.0000 mL | Freq: Once | INTRAVENOUS | Status: DC

## 2021-04-24 MED ORDER — SODIUM CHLORIDE (PF) 0.9 % IJ SOLN
INTRAMUSCULAR | Status: AC
  Filled 2021-04-24: qty 50

## 2021-04-24 NOTE — Progress Notes (Signed)
The patient was seen in the infusion room today while she was being set up to receive 1 unit of blood. The patient was seen by myself and Dr. Julien Nordmann yesterday. We are very familiar with the patient at baseline and yesterday she was almost unrecognizable. This patient comes into the clinic once every 3 weeks without any assistance or supplemental oxygen. Yesterday, she was endorsing fatigue, weakness, and shortness of breath. She was not hypoxic yesterday. She was frail appearing and seemed a little less sharp than usual. She also had an an ashy skin appearance. She missed her restaging CT scan on 12/23 which is unusual for her. She did not receive treatment yesterday. The plan was to reschedule her repeat CT of the chest, abdomen and pelvis and see her next week to review the results. She received 1 L of fluid yesterday and 1 unit of blood. Today, she came to the clinic with continued tachycardia and hypoxia with an oxygen saturation of 78%. Her oxygen was 84% with 2L of oxygen and 100% with 4 L. She has a known pleural effusion and was supposed to have a pleurex placed on 05/27/20; however, she seems to have changed her mind about having this done. She states her pulmonologist previously ordered 2L of oxygen PRN at home. She uses this intermittently if needed. Due to her sudden change in status/health, tachycardia, and hypoxia, she was sent to the ER for further evaluation and management.

## 2021-04-24 NOTE — Discharge Instructions (Addendum)
The CAT scans today looked about the same and did not show any progression of your cancer.  Your oxygen does drop when you take off the oxygen so you should wear 2 L of oxygen all the time.  If you start having shortness of breath, chest pain, fever or other concerns return to the emergency room.

## 2021-04-24 NOTE — ED Notes (Signed)
Patient transported to CT 

## 2021-04-24 NOTE — ED Notes (Signed)
Coming from cancer center-states she came to them hypoxic-is on O2 PRN at home-has not been using-has an appointment with endoscopy at 2 pm today

## 2021-04-24 NOTE — ED Provider Notes (Signed)
[facility] DEPT Provider Note   CSN: [id number] Arrival date & time: 04/24/21  0935     History Chief Complaint  Patient presents with   Hypoxia    Susan Holder is a 65 y.o. female.  Patient is a 65 year old female with a history of stage IV adenocarcinoma, prior pericardial and pleural effusions requiring drainage, RA, malnutrition who presents today from the cancer center due to hypoxia.  Patient reports she went to the cancer center yesterday and received IV fluids and blood and was returning today to get her second unit of blood.  Patient reports she did not feel any different today than she did yesterday when she went to the cancer center but today she was noted to be hypoxic in the 70s which improved to the 80s with oxygen.  She does have 2 L nasal cannula oxygen at home to use as needed but reports she only uses it occasionally.  She did admit that more recently she has had gradually worsening shortness of breath, fatigue, generalized weakness but today she reports was no different than any other day.  She has a chronic cough but has not had fever or productive cough.  She denies feeling short of breath currently.  She has not noticed any significant edema in her lower extremities.  She was scheduled to get a Pleurx catheter done on Friday but she reports that she does not want it done anymore because of the severe pain she had when she had her pleural effusions drained in the past.  She denies any chest pain at this time.  Patient is currently on 2 L of nasal cannula satting's 96%.  The history is provided by the patient and medical records.      Past Medical History:  Diagnosis Date   Acid reflux 09/21/2018   Adenocarcinoma of right lung, stage 4 ([place]) 11/17/2017   Allergic rhinitis 08/22/2013   ANA positive 05/29/2016   Aortic atherosclerosis ([place]) 10/23/2020   Brain metastases ([place]) 03/29/2019   Coronary artery calcification seen on CT scan  10/23/2020   DDD (degenerative disc disease), cervical 05/29/2016   Encounter for antineoplastic chemotherapy 11/17/2017   Encounter for antineoplastic immunotherapy 02/01/2018   Goals of care, counseling/discussion 11/17/2017   High risk medication use 05/29/2016   04/29/2016: ==> plq 200 am & 100 qhs(adeq response).   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   Other fatigue 05/29/2016   Pericardial effusion 10/23/2020   Primary malignant neoplasm of bronchus of right lower lobe ([place]) 12/21/2017   Rheumatoid arthritis (HCC)    Rheumatoid arthritis involving multiple sites with positive rheumatoid factor ([place]) 05/29/2016   +RF +ANA +CCP    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   Smoker 05/29/2016   Trigger finger, left ring finger 05/29/2016   Trigger finger, right ring finger 05/29/2016   Vitamin D deficiency 05/29/2016    Patient Active Problem List   Diagnosis Date Noted   Anemia 04/23/2021   Pleural effusion 04/12/2021   Malnutrition of moderate degree 02/09/2021   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   Aortic atherosclerosis ([place]) 10/23/2020   Coronary artery calcification seen on CT scan 10/23/2020   Pericardial effusion 10/23/2020   met lung ca 10/22/2020   Rheumatoid arthritis ([place]) 10/22/2020  Brain metastases ([place]) 03/29/2019   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/04/2018   Acid reflux 09/21/2018   Hoarseness 03/16/2018   Encounter for antineoplastic immunotherapy 02/01/2018   Primary malignant neoplasm of bronchus of right lower lobe ([place]) 12/21/2017   Adenocarcinoma of right lung, stage 4 ([place]) 11/17/2017   Encounter for antineoplastic chemotherapy 11/17/2017   Goals of care, counseling/discussion 11/17/2017   Lung mass    Left hand pain 03/06/2017   Rheumatoid arthritis involving  multiple sites with positive rheumatoid factor ([place]) 05/29/2016   ANA positive 05/29/2016   Vitamin D deficiency 05/29/2016   High risk medication use 05/29/2016   Trigger finger, left ring finger 05/29/2016   Trigger finger, right ring finger 05/29/2016   DDD (degenerative disc disease), cervical 05/29/2016   Smoker 05/29/2016   Other fatigue 05/29/2016   Allergic rhinitis 08/22/2013    Past Surgical History:  Procedure Laterality Date   BRONCHIAL NEEDLE ASPIRATION BIOPSY  11/09/2017   Procedure: BRONCHIAL NEEDLE ASPIRATION BIOPSIES;  Surgeon: Marshell Garfinkel, MD;  Location: [facility];  Service: Cardiopulmonary;;   ENDOBRONCHIAL ULTRASOUND Bilateral 11/09/2017   Procedure: ENDOBRONCHIAL ULTRASOUND;  Surgeon: Marshell Garfinkel, MD;  Location: [facility];  Service: Cardiopulmonary;  Laterality: Bilateral;   NASAL SINUS SURGERY     NASAL SINUS SURGERY     THORACENTESIS Right 02/07/2021   Procedure: THORACENTESIS;  Surgeon: Lajuana Matte, MD;  Location: [facility];  Service: Thoracic;  Laterality: Right;   TUBAL LIGATION     VIDEO BRONCHOSCOPY  11/09/2017   Procedure: VIDEO BRONCHOSCOPY;  Surgeon: Marshell Garfinkel, MD;  Location: [facility];  Service: Cardiopulmonary;;   XI ROBOTIC ASSISTED PERICARDIAL WINDOW Left 02/07/2021   Procedure: XI ROBOTIC ASSISTED THORACOSCOPY PERICARDIAL WINDOW;  Surgeon: Lajuana Matte, MD;  Location: [facility];  Service: Thoracic;  Laterality: Left;     OB History   No obstetric history on file.     Family History  Problem Relation Age of Onset   Stroke Mother    Alzheimer's disease Mother    Heart disease Mother    Emphysema Father    Hypertension Brother    Heart attack Maternal Aunt    Heart failure Maternal Grandmother    Hypertension Paternal Grandmother     Social History   Tobacco Use   Smoking status: Former    Packs/day: 0.50    Years: 45.00    Pack years: 22.50    Types: Cigarettes    Quit date: 09/29/2017    Years since  quitting: 3.5   Smokeless tobacco: Never  Vaping Use   Vaping Use: Never used  Substance Use Topics   Alcohol use: Not Currently   Drug use: No    Home Medications Prior to Admission medications   Medication Sig Start Date End Date Taking? Authorizing Provider  folic acid (FOLVITE) 1 MG tablet TAKE 1 TABLET(1 MG) BY MOUTH DAILY 02/08/21   Curt Bears, MD  hydroxychloroquine (PLAQUENIL) 200 MG tablet TAKE 1 TABLET BY MOUTH TWICE DAILY MONDAY THROUGH FRIDAY ONLY. 11/12/20   Ofilia Neas, PA-C  omeprazole (PRILOSEC) 20 MG capsule Take 20 mg by mouth daily.    [provider]  prochlorperazine (COMPAZINE) 10 MG tablet TAKE 1 TABLET(10 MG) BY MOUTH EVERY 6 HOURS AS NEEDED FOR NAUSEA OR VOMITING 01/15/21   Heilingoetter, Cassandra L, PA-C  vitamin C (ASCORBIC ACID) 500 MG tablet Take 500 mg by mouth daily.    [provider]  VITAMIN D PO Take 1 tablet by  mouth daily.    [provider]    Allergies    Patient has no known allergies.  Review of Systems   Review of Systems  All other systems reviewed and are negative.  Physical Exam Updated Vital Signs BP (!) 141/72    Pulse (!) 107    Temp 98.1 F (36.7 C) (Oral)    Resp 20    Ht 5' (1.524 m)    Wt 45.4 kg    LMP 04/28/2000    SpO2 96%    BMI 19.53 kg/m   Physical Exam Vitals and nursing note reviewed.  Constitutional:      General: She is not in acute distress.    Appearance: She is well-developed. She is ill-appearing.     Comments: Chronically ill appearing and underweight  HENT:     Head: Normocephalic and atraumatic.     Mouth/Throat:     Mouth: Mucous membranes are dry.  Eyes:     Pupils: Pupils are equal, round, and reactive to light.  Cardiovascular:     Rate and Rhythm: Regular rhythm. Tachycardia present.     Heart sounds: Normal heart sounds. No murmur heard.   No friction rub.  Pulmonary:     Effort: Pulmonary effort is normal. Tachypnea present.     Breath sounds: Examination  of the right-upper field reveals decreased breath sounds. Examination of the right-middle field reveals decreased breath sounds. Examination of the right-lower field reveals decreased breath sounds. Decreased breath sounds and rales present. No wheezing.  Abdominal:     General: Bowel sounds are normal. There is no distension.     Palpations: Abdomen is soft.     Tenderness: There is no abdominal tenderness. There is no guarding or rebound.  Musculoskeletal:        General: No tenderness. Normal range of motion.     Cervical back: Normal range of motion and neck supple.     Right lower leg: No edema.     Left lower leg: No edema.     Comments: No edema  Skin:    General: Skin is warm and dry.     Findings: No rash.  Neurological:     Mental Status: She is alert and oriented to person, place, and time. Mental status is at baseline.     Cranial Nerves: No cranial nerve deficit.  Psychiatric:        Mood and Affect: Mood normal.        Behavior: Behavior normal.    ED Results / Procedures / Treatments   Labs (all labs ordered are listed, but only abnormal results are displayed) Labs Reviewed  CBC WITH DIFFERENTIAL/PLATELET - Abnormal; Notable for the following components:      Result Value   RBC 2.97 (*)    Hemoglobin 10.0 (*)    HCT 30.1 (*)    MCV 101.3 (*)    RDW 24.1 (*)    Platelets 526 (*)    Neutro Abs 8.3 (*)    Monocytes Absolute 1.1 (*)    Abs Immature Granulocytes 0.08 (*)    All other components within normal limits  COMPREHENSIVE METABOLIC PANEL - Abnormal; Notable for the following components:   Potassium 3.4 (*)    Glucose, Bld 101 (*)    Creatinine, Ser 1.29 (*)    Calcium 10.4 (*)    Albumin 2.6 (*)    GFR, Estimated 45 (*)    All other components within normal limits  BLOOD GAS,  VENOUS - Abnormal; Notable for the following components:   pH, Ven 7.548 (*)    pCO2, Ven 31.0 (*)    pO2, Ven 169.0 (*)    Acid-Base Excess 4.9 (*)    All other components  within normal limits  TYPE AND SCREEN    EKG EKG Interpretation  Date/Time:  Wednesday April 24 2021 10:52:41 EST Ventricular Rate:  107 PR Interval:  120 QRS Duration: 78 QT Interval:  310 QTC Calculation: 414 R Axis:   56 Text Interpretation: Sinus tachycardia No significant change since last tracing Confirmed by Blanchie Dessert [phone]) on 04/24/2021 11:42:36 AM  Radiology CT CHEST ABDOMEN PELVIS W CONTRAST  Result Date: 04/24/2021 CLINICAL DATA:  Small cell lung cancer, assess treatment response EXAM: CT CHEST, ABDOMEN, AND PELVIS WITH CONTRAST TECHNIQUE: Multidetector CT imaging of the chest, abdomen and pelvis was performed following the standard protocol during bolus administration of intravenous contrast. CONTRAST:  43m OMNIPAQUE IOHEXOL 350 MG/ML SOLN COMPARISON:  CT chest, abdomen and pelvis dated September 12th 2022 FINDINGS: CT CHEST FINDINGS Cardiovascular: Normal heart size. Small pericardial effusion, decreased in size when compared with prior exam. Atherosclerotic disease of the thoracic aorta. LAD and RCA coronary artery calcifications. Mediastinum/Nodes: No pathologically enlarged lymph nodes seen in the chest. Patulous esophagus. Lungs/Pleura: Central airways are patent. Centrilobular emphysema. Right paramediastinal post radiation changes, unchanged when compared with prior exam. New diffuse bilateral ground-glass opacities with mild left upper lobe consolidation. Small bilateral pleural effusions, slightly decreased in size on the right when compared to prior exam and increased in size on the left. Musculoskeletal: No chest wall mass or suspicious bone lesions identified. CT ABDOMEN PELVIS FINDINGS Hepatobiliary: No focal liver abnormality is seen. Mild periportal edema. No gallstones, gallbladder wall thickening, or biliary dilatation. Pancreas: Unremarkable. No pancreatic ductal dilatation or surrounding inflammatory changes. Spleen: Normal in size without focal  abnormality. Adrenals/Urinary Tract: Adrenal glands are unremarkable. Kidneys are normal, without renal calculi, focal lesion, or hydronephrosis. Bladder is unremarkable. Stomach/Bowel: Stomach is within normal limits. Appendix appears normal. No evidence of bowel wall thickening, distention, or inflammatory changes. Vascular/Lymphatic: Aortic atherosclerosis. No enlarged abdominal or pelvic lymph nodes. Reproductive: Status post hysterectomy. No adnexal masses. Other: No abdominal wall hernia or abnormality. No abdominopelvic ascites. Musculoskeletal: No acute or significant osseous findings. IMPRESSION: 1. Stable postradiation changes of the perihilar right lung. 2. New diffuse bilateral ground-glass opacities with mild left upper lobe consolidations, differential includes pulmonary edema versus infectious or inflammatory etiology. Recommend attention on follow-up per clinical protocol. 3. Small bilateral pleural effusions, slightly decreased in size on the right when compared to prior exam and increased in size on the left. 4. Small pericardial effusion, decreased in size when compared to prior exam. 5. No evidence of metastatic disease in the abdomen or pelvis. 6. Aortic Atherosclerosis (ICD10-I70.0) and Emphysema (ICD10-J43.9). Electronically Signed   By: LYetta GlassmanM.D.   On: 04/24/2021 14:21   DG Chest Port 1 View  Result Date: 04/24/2021 CLINICAL DATA:  Hypoxia. Increased shortness of breath. History of left lung cancer. EXAM: PORTABLE CHEST 1 VIEW COMPARISON:  04/03/2021 FINDINGS: Normal sized heart. Moderate-sized bilateral pleural effusions without gross significant change. This is difficult to assess due to a possible decreased depth of inspiration. Stable mild prominence of the interstitial markings and decreased small amount of atelectasis or scarring in the right upper lung zone. IMPRESSION: 1. Moderate-sized bilateral pleural effusions without gross change, difficult to assess due to  different techniques and possible decreased inspiration. 2.  Decreased right upper lobe atelectasis and possible scarring. Electronically Signed   By: Claudie Revering M.D.   On: 04/24/2021 11:14    Procedures Procedures   Medications Ordered in ED Medications  sodium chloride (PF) 0.9 % injection (has no administration in time range)  iohexol (OMNIPAQUE) 350 MG/ML injection 50 mL (50 mLs Intravenous Contrast Given 04/24/21 1339)    ED Course  I have reviewed the triage vital signs and the nursing notes.  Pertinent labs & imaging results that were available during my care of the patient were reviewed by me and considered in my medical decision making (see chart for details).    MDM Rules/Calculators/A&P                          Patient is a 65 year old female with multiple medical problems presenting today from the cancer center with a complaint of hypoxia.  Patient reports she feels no different than what she has felt recently however she has had a decline in her functional status over the last 3 weeks per cancer center report.  Does appear mildly tachypneic on exam but on 2 L is satting 96%.  She does have decreased breath sounds on the right and concern for worsening pleural effusion which she has had in the past.  She did receive IV fluids and blood yesterday.  Will recheck labs today.  Patient is awake and alert and denies any pain.  She was scheduled to have Pleurx catheter placed however she reports she does not want to do that now because it was so painful in the past.  She also missed her CT of her chest abdomen pelvis that was going to be used for staging on the 23rd which the cancer center said was highly unusual for the patient.  3:15 PM CT of the chest abdomen pelvis does not show significant changes other than inflammatory changes in the lung.  When attempting to turn off the oxygen completely patient drops to the 80s.  Her hemoglobin is improved today at 10 with normal white count  and renal function.  Spoke with Dr. Alen Blew with oncology discussed the patient's case.  She is otherwise reasonably well-appearing and feel these are all more chronic issues.  Patient does have oxygen at home.  In discussing with Dr. Alen Blew he felt also that it was reasonable that patient go home and continue home oxygen therapy and follow-up with Dr. Earlie Server as planned.  She was given return precautions.  MDM   Amount and/or Complexity of Data Reviewed Clinical lab tests: ordered and reviewed Tests in the radiology section of CPT: ordered and reviewed Tests in the medicine section of CPT: ordered and reviewed       Final Clinical Impression(s) / ED Diagnoses Final diagnoses:  Hypoxia    Rx / DC Orders ED Discharge Orders     None        Blanchie Dessert, MD 04/24/21 1516

## 2021-04-24 NOTE — Progress Notes (Signed)
Pt came in today for her 2nd unit of blood. Pt is hypoxic with O2 at 78 on room air. Pt was put on 4L. Cassie, PA was notified.

## 2021-04-24 NOTE — ED Triage Notes (Signed)
Pt was at cancer center when O2 sats noted to bed 78 on RA.  Pt uses 2L daily however generally does not bring to the cancer center w/ her.  Pt placed on 4L O2 w/ improvement in O2 sat.  HR 120, BP 120/72.  Hx of pleural effusion.

## 2021-04-24 NOTE — ED Notes (Signed)
O2 turned off per Dr. Maryan Rued.  Pt's O2 sats dropped to low 80's on RA.  2L of O2 turned back on w/ improvement in sats.

## 2021-04-25 ENCOUNTER — Telehealth: Payer: Self-pay | Admitting: Pulmonary Disease

## 2021-04-25 ENCOUNTER — Telehealth: Payer: Self-pay | Admitting: Physician Assistant

## 2021-04-25 NOTE — Telephone Encounter (Signed)
Procedure has been canceled. Nothing further needed at this time.

## 2021-04-25 NOTE — Telephone Encounter (Signed)
Called and spoke with pt who states that she wants to cancel her procedure tomorrow and no longer wants to have the pleurx catheter placed. Pt said that the procedure she has had before for the thoracentesis was painful and does not want to go through with having another procedure done to where she has to have the drain in place hanging from her body.  Routing to Dr. Valeta Harms.

## 2021-04-25 NOTE — Telephone Encounter (Signed)
Sch per 12/28 los, pt aware

## 2021-04-25 NOTE — Telephone Encounter (Signed)
Made Mandi aware that message was sent to procedure pool as well as Vista Lawman, RN for pt's procedure to be cancelled.

## 2021-04-26 ENCOUNTER — Other Ambulatory Visit: Payer: Self-pay | Admitting: Radiation Therapy

## 2021-04-26 ENCOUNTER — Encounter (HOSPITAL_COMMUNITY): Admission: RE | Payer: Self-pay | Source: Ambulatory Visit

## 2021-04-26 ENCOUNTER — Ambulatory Visit (HOSPITAL_COMMUNITY): Admission: RE | Admit: 2021-04-26 | Payer: Medicare PPO | Source: Ambulatory Visit | Admitting: Pulmonary Disease

## 2021-04-26 DIAGNOSIS — C3491 Malignant neoplasm of unspecified part of right bronchus or lung: Secondary | ICD-10-CM | POA: Insufficient documentation

## 2021-04-26 DIAGNOSIS — C7931 Secondary malignant neoplasm of brain: Secondary | ICD-10-CM

## 2021-04-26 DIAGNOSIS — J90 Pleural effusion, not elsewhere classified: Secondary | ICD-10-CM

## 2021-04-26 SURGERY — INSERTION, PLEURAL DRAINAGE CATHETER
Anesthesia: Topical | Laterality: Right

## 2021-04-30 ENCOUNTER — Other Ambulatory Visit: Payer: Self-pay

## 2021-04-30 ENCOUNTER — Ambulatory Visit: Payer: Medicare PPO

## 2021-04-30 ENCOUNTER — Other Ambulatory Visit: Payer: Medicare PPO

## 2021-04-30 DIAGNOSIS — C3491 Malignant neoplasm of unspecified part of right bronchus or lung: Secondary | ICD-10-CM

## 2021-04-30 NOTE — Progress Notes (Signed)
[name] OFFICE PROGRESS NOTE  [name], DO (Inactive) No address on file  DIAGNOSIS: Metastatic non-small cell lung cancer initially diagnosed as stage IIIA (T3, N1/N2, M0) non-small cell lung cancer, adenocarcinoma presented with large right lower lobe lung mass with extension to the right hilum and subcarinal area diagnosed in July 2019.  She has brain metastasis in October 2020.   Biomarker Findings Tumor Mutational Burden - TMB-Intermediate (6 Muts/Mb) Microsatellite status - MS-Stable Genomic Findings For a complete list of the genes assayed, please refer to the Appendix. NRAS Q61R ARAF amplification STK11 G56W KRAS G13D MYCN amplification MCL1 amplification NKX2-1 amplification - equivocal TP53 G245V 7 Disease relevant genes with no reportable alterations: EGFR, ALK, BRAF, MET, ERBB2, RET, ROS1   PRIOR THERAPY: 1) Course of concurrent chemoradiation with weekly carboplatin for AUC of 2 and paclitaxel 45 mg/M2.  Status post 7 cycles.  Last dose was giving 01/11/2018. 2) Consolidation treatment with immunotherapy with Imfinzi (Durvalumab) 10 mg/KG every 2 weeks.  First dose February 09, 2018.  Status post 19 cycles. 3) status post stereotactic body radiotherapy to the enlarging right supraclavicular lymphadenopathy under the care of Dr. Lisbeth Renshaw. 4) SRS to multiple brain metastasis under the care of Dr. Lisbeth Renshaw.  CURRENT THERAPY: Systemic chemotherapy with carboplatin for AUC of 5, Alimta 500 mg/M2 and Keytruda 200 mg IV every 3 weeks.  First dose 08/16/2019.  Status post 29 cycles.  Starting from cycle #5 the patient is on maintenance treatment with Alimta and Keytruda every 3 weeks. Alimta was reduced to 400 mg/m2 starting from cycle #21.  INTERVAL HISTORY: Susan Holder 66 y.o. female returns to the clinic today for a follow-up visit.  The patient was last seen in the clinic last week on 04/23/2021.  The patient was looking and feeling poorly that day  with worsening fatigue, generalized weakness, and continued weight loss.  The patient is well-known to [place] and generally is well-appearing and walks into her appointments.  The patient was very frail and cachectic appearing at her last appointment and was in a wheelchair.  She did not receive treatment with chemotherapy and immunotherapy that day.  Her treatment was delayed by 1 week until she can have her restaging CT scan , which she missed her appointment on 04/19/2021.  The patient had evidence of worsening anemia despite being on reduced reduced Alimta and she is also on immunotherapy with Keytruda.  The patient received 1 L of fluid and 1 unit of blood since being seen last week.  When she came in the clinic on 04/24/2021 for her second unit of blood, the patient was noted to be hypoxic with an oxygen at 78%.  The patient generally does not wear supplemental oxygen in the clinic and has not had a history of hypoxia.  She does have an oxygen tank at home if needed which was prescribed by her pulmonologist previously but she generally does not need to use this frequently. She did not come to the clinic with her portable oxygen today.  The patient had a restaging CT scan while in the ER which showed some inflammatory changes in her lung which could be pulmonary edema versus inflammation/infection.  The patient was discharged home with follow up with oncology. The patient was supposed to undergo a Pleurx catheter placement by Dr. Valeta Harms on 04/26/2021 but the patient thought about this and she decided she did not want a proceed with this as she did not want a drain hanging out  of her body. She does not want thoracentesis secondary to pain.  Since being seen last week, her fatigue and generalized weakness is similar.  She may have had a short-term benefit after receiving the blood infusion last week.  The patient reported today that she spends 90% of the day in bed.  She lost another 4 pounds since being seen last  week, despite being thin and cachectic to begin with.  She denies any abnormal bleeding or bruising including epistaxis, gingival bleeding, hemoptysis, hematemesis, melena, or hematochezia.  The patient drinks Ensure twice a day due to her weight loss.  She has been previously seen by member the nutritionist team and last saw them last week on 04/23/2021.  The patient has a history of metastatic disease to the brain for which she is up-to-date on her brain MRI.  She denies any fever, chills, or night sweats.  She denies any sick contacts.  She denies any chest pain or hemoptysis.  She has a baseline chronic dry cough which has has produced more yellow phlegm than usual recently.  She denies any changes with her baseline dyspnea on exertion.  Denies any nausea, vomiting, diarrhea, or constipation.  Denies any headache or visual changes.  Denies any rashes or skin changes.  She is here today for evaluation to review her scan results before considering starting cycle #30   MEDICAL HISTORY: Past Medical History:  Diagnosis Date   Acid reflux 09/21/2018   Adenocarcinoma of right lung, stage 4 ([place]) 11/17/2017   Allergic rhinitis 08/22/2013   ANA positive 05/29/2016   Aortic atherosclerosis ([place]) 10/23/2020   Brain metastases ([place]) 03/29/2019   Coronary artery calcification seen on CT scan 10/23/2020   DDD (degenerative disc disease), cervical 05/29/2016   Encounter for antineoplastic chemotherapy 11/17/2017   Encounter for antineoplastic immunotherapy 02/01/2018   Goals of care, counseling/discussion 11/17/2017   High risk medication use 05/29/2016   04/29/2016: ==> plq 200 am & 100 qhs(adeq response).   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[place]) 11/04/2018   Other fatigue 05/29/2016   Pericardial effusion 10/23/2020   Primary malignant neoplasm of  bronchus of right lower lobe ([place]) 12/21/2017   Rheumatoid arthritis (HCC)    Rheumatoid arthritis involving multiple sites with positive rheumatoid factor ([place]) 05/29/2016   +RF +ANA +CCP    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   Smoker 05/29/2016   Trigger finger, left ring finger 05/29/2016   Trigger finger, right ring finger 05/29/2016   Vitamin D deficiency 05/29/2016    ALLERGIES:  has No Known Allergies.  MEDICATIONS:  Current Outpatient Medications  Medication Sig Dispense Refill   doxycycline (VIBRA-TABS) 100 MG tablet Take 1 tablet (100 mg total) by mouth 2 (two) times daily. 28 tablet 0   methylPREDNISolone (MEDROL DOSEPAK) 4 MG TBPK tablet Use as instructed 21 tablet 0   folic acid (FOLVITE) 1 MG tablet TAKE 1 TABLET(1 MG) BY MOUTH DAILY 30 tablet 4   hydroxychloroquine (PLAQUENIL) 200 MG tablet TAKE 1 TABLET BY MOUTH TWICE DAILY MONDAY THROUGH FRIDAY ONLY. 120 tablet 0   omeprazole (PRILOSEC) 20 MG capsule Take 20 mg by mouth daily.     prochlorperazine (COMPAZINE) 10 MG tablet TAKE 1 TABLET(10 MG) BY MOUTH EVERY 6 HOURS AS NEEDED FOR NAUSEA OR VOMITING 30 tablet  2   vitamin C (ASCORBIC ACID) 500 MG tablet Take 500 mg by mouth daily.     VITAMIN D PO Take 1 tablet by mouth daily.     No current facility-administered medications for this visit.    SURGICAL HISTORY:  Past Surgical History:  Procedure Laterality Date   BRONCHIAL NEEDLE ASPIRATION BIOPSY  11/09/2017   Procedure: BRONCHIAL NEEDLE ASPIRATION BIOPSIES;  Surgeon: Marshell Garfinkel, MD;  Location: [facility];  Service: Cardiopulmonary;;   ENDOBRONCHIAL ULTRASOUND Bilateral 11/09/2017   Procedure: ENDOBRONCHIAL ULTRASOUND;  Surgeon: Marshell Garfinkel, MD;  Location: [facility];  Service: Cardiopulmonary;  Laterality: Bilateral;   NASAL SINUS SURGERY     NASAL SINUS SURGERY     THORACENTESIS Right 02/07/2021   Procedure: THORACENTESIS;  Surgeon: Lajuana Matte, MD;  Location: [facility];  Service: Thoracic;   Laterality: Right;   TUBAL LIGATION     VIDEO BRONCHOSCOPY  11/09/2017   Procedure: VIDEO BRONCHOSCOPY;  Surgeon: Marshell Garfinkel, MD;  Location: [facility];  Service: Cardiopulmonary;;   XI ROBOTIC ASSISTED PERICARDIAL WINDOW Left 02/07/2021   Procedure: XI ROBOTIC ASSISTED THORACOSCOPY PERICARDIAL WINDOW;  Surgeon: Lajuana Matte, MD;  Location: [facility];  Service: Thoracic;  Laterality: Left;    REVIEW OF SYSTEMS:   Review of Systems  Constitutional: Positive for fatigue, weakness, decreased appetite, weight loss.  Negative for chills and fever.  HENT: Negative for mouth sores, nosebleeds, sore throat and trouble swallowing.   Eyes: Negative for eye problems and icterus.  Respiratory: Positive for shortness of breath.  Positive for worsening of her chronic cough.  Negative for hemoptysis and wheezing.   Cardiovascular: Negative for chest pain and leg swelling.  Gastrointestinal: Negative for abdominal pain, constipation, diarrhea, nausea and vomiting.  Genitourinary: Negative for bladder incontinence, difficulty urinating, dysuria, frequency and hematuria.   Musculoskeletal: Negative for back pain, gait problem, neck pain and neck stiffness.  Skin: Negative for itching and rash.  Neurological: Negative for dizziness, extremity weakness, gait problem, headaches, light-headedness and seizures.  Hematological: Negative for adenopathy. Does not bruise/bleed easily.  Psychiatric/Behavioral: Negative for confusion, depression and sleep disturbance. The patient is not nervous/anxious.     PHYSICAL EXAMINATION:  Blood pressure 129/74, pulse (!) 120, temperature 97.9 F (36.6 C), temperature source Tympanic, resp. rate 18, height 5' (1.524 m), weight 94 lb 4.8 oz (42.8 kg), last menstrual period 04/28/2000, SpO2 100 %.  ECOG PERFORMANCE STATUS: 2-3  Physical Exam  Constitutional: Oriented to person, place, and time and cachectic appearing female and in no distress.  HENT:  Head:  Normocephalic and atraumatic.  Mouth/Throat: Oropharynx is clear and moist. No oropharyngeal exudate.  Eyes: Conjunctivae are normal. Right eye exhibits no discharge. Left eye exhibits no discharge. No scleral icterus.  Neck: Normal range of motion. Neck supple.  Cardiovascular: Tachycardic regular rhythm, normal heart sounds and intact distal pulses.   Pulmonary/Chest: Effort normal and breath sounds normal. No respiratory distress.  Some bilateral crackles noted.  On supplemental oxygen..  Abdominal: Soft. Bowel sounds are normal. Exhibits no distension and no mass. There is no tenderness.  Musculoskeletal: Normal range of motion. Exhibits no edema.  Lymphadenopathy:    No cervical adenopathy.  Neurological: Alert and oriented to person, place, and time. Exhibits muscle wasting.  The patient is examined in the wheelchair.   Skin: Skin is warm and dry. No rash noted. Not diaphoretic. No erythema. No pallor.  Psychiatric: Mood, memory and judgment normal.  Vitals reviewed.    LABORATORY DATA: Lab Results  Component Value Date   WBC 20.8 (H) 05/01/2021   HGB 9.6 (L) 05/01/2021   HCT 28.6 (L) 05/01/2021   MCV 102.5 (H) 05/01/2021   PLT 497 (H) 05/01/2021      Chemistry      Component Value Date/Time   NA 135 05/01/2021 0822   K 3.6 05/01/2021 0822   CL 100 05/01/2021 0822   CO2 26 05/01/2021 0822   BUN 18 05/01/2021 0822   CREATININE 1.24 (H) 05/01/2021 0822   CREATININE 0.80 07/24/2017 1438      Component Value Date/Time   CALCIUM 10.9 (H) 05/01/2021 0822   ALKPHOS 68 05/01/2021 0822   AST 31 05/01/2021 0822   ALT 13 05/01/2021 0822   BILITOT 0.5 05/01/2021 0822       RADIOGRAPHIC STUDIES:  DG Chest 2 View  Result Date: 04/03/2021 CLINICAL DATA:  Status post thoracentesis EXAM: CHEST - 2 VIEW COMPARISON:  03/29/2021 FINDINGS: Slight interval reduction in volume of a small right pleural effusion, with unchanged, bandlike scarring and fibrosis of the right midlung. No  pneumothorax. Unchanged moderate left pleural effusion. The heart and mediastinum are unremarkable. Disc degenerative disease of the thoracic spine. IMPRESSION: 1. Slight interval reduction in volume of a small right pleural effusion, with unchanged, bandlike scarring and fibrosis of the right midlung. No pneumothorax. 2. Unchanged moderate left pleural effusion. Electronically Signed   By: Delanna Ahmadi M.D.   On: 04/03/2021 12:39   CT CHEST ABDOMEN PELVIS W CONTRAST  Result Date: 04/24/2021 CLINICAL DATA:  Small cell lung cancer, assess treatment response EXAM: CT CHEST, ABDOMEN, AND PELVIS WITH CONTRAST TECHNIQUE: Multidetector CT imaging of the chest, abdomen and pelvis was performed following the standard protocol during bolus administration of intravenous contrast. CONTRAST:  73m OMNIPAQUE IOHEXOL 350 MG/ML SOLN COMPARISON:  CT chest, abdomen and pelvis dated September 12th 2022 FINDINGS: CT CHEST FINDINGS Cardiovascular: Normal heart size. Small pericardial effusion, decreased in size when compared with prior exam. Atherosclerotic disease of the thoracic aorta. LAD and RCA coronary artery calcifications. Mediastinum/Nodes: No pathologically enlarged lymph nodes seen in the chest. Patulous esophagus. Lungs/Pleura: Central airways are patent. Centrilobular emphysema. Right paramediastinal post radiation changes, unchanged when compared with prior exam. New diffuse bilateral ground-glass opacities with mild left upper lobe consolidation. Small bilateral pleural effusions, slightly decreased in size on the right when compared to prior exam and increased in size on the left. Musculoskeletal: No chest wall mass or suspicious bone lesions identified. CT ABDOMEN PELVIS FINDINGS Hepatobiliary: No focal liver abnormality is seen. Mild periportal edema. No gallstones, gallbladder wall thickening, or biliary dilatation. Pancreas: Unremarkable. No pancreatic ductal dilatation or surrounding inflammatory changes.  Spleen: Normal in size without focal abnormality. Adrenals/Urinary Tract: Adrenal glands are unremarkable. Kidneys are normal, without renal calculi, focal lesion, or hydronephrosis. Bladder is unremarkable. Stomach/Bowel: Stomach is within normal limits. Appendix appears normal. No evidence of bowel wall thickening, distention, or inflammatory changes. Vascular/Lymphatic: Aortic atherosclerosis. No enlarged abdominal or pelvic lymph nodes. Reproductive: Status post hysterectomy. No adnexal masses. Other: No abdominal wall hernia or abnormality. No abdominopelvic ascites. Musculoskeletal: No acute or significant osseous findings. IMPRESSION: 1. Stable postradiation changes of the perihilar right lung. 2. New diffuse bilateral ground-glass opacities with mild left upper lobe consolidations, differential includes pulmonary edema versus infectious or inflammatory etiology. Recommend attention on follow-up per clinical protocol. 3. Small bilateral pleural effusions, slightly decreased in size on the right when compared to prior exam and increased in size on the left. 4. Small  pericardial effusion, decreased in size when compared to prior exam. 5. No evidence of metastatic disease in the abdomen or pelvis. 6. Aortic Atherosclerosis (ICD10-I70.0) and Emphysema (ICD10-J43.9). Electronically Signed   By: Yetta Glassman M.D.   On: 04/24/2021 14:21   DG Chest Port 1 View  Result Date: 04/24/2021 CLINICAL DATA:  Hypoxia. Increased shortness of breath. History of left lung cancer. EXAM: PORTABLE CHEST 1 VIEW COMPARISON:  04/03/2021 FINDINGS: Normal sized heart. Moderate-sized bilateral pleural effusions without gross significant change. This is difficult to assess due to a possible decreased depth of inspiration. Stable mild prominence of the interstitial markings and decreased small amount of atelectasis or scarring in the right upper lung zone. IMPRESSION: 1. Moderate-sized bilateral pleural effusions without gross  change, difficult to assess due to different techniques and possible decreased inspiration. 2. Decreased right upper lobe atelectasis and possible scarring. Electronically Signed   By: Claudie Revering M.D.   On: 04/24/2021 11:14     ASSESSMENT/PLAN:  This is a very pleasant 66 year old African American female with metastatic non-small cell lung cancer, adenocarcinoma initially diagnosed as IIIA in July 2019. She had evidence of metastatic disease with brain metastases in October 2020. She has no actionable mutations.    She is status post 7 cycles of concurrent chemoradiation with carboplatin for an AUC of 2 and paclitaxel 45 mg/m2. She had a partial response.   She then underwent consolidation immunotherapy with imfinzi. She is status post 18 cycles.  She completed SBRT to the right supraclavicular lymph node.   She had evidence of multiple brain metastases in October 2020. She underwent SRS treatment to these lesions under the care of Dr. Lisbeth Renshaw.   She currently is undergoing systemic chemotherapy with carboplatin for an AUC of 5, Alimta 500 mg/m2 and Keytruda 200 mg IV every 3 weeks. She is status post 29 cycles. Starting from cycle #5 she started maintenance Alimta and Keytruda. Starting from cycle #21, Dr. Julien Nordmann reduced her alimta to 400 mg/m2 due to fatigue.  The patient was seen in the clinic last week. She was feeling poorly and had several lab abnormalities. She received 1 Units of blood in the interval. She was seen in the ER due to low oxygen.   The patient recently had a restaging CT scan performed. Dr. Julien Nordmann personally and independently reviewed the scan and discussed the results with the patient. The scan showed no evidence of disease progression but showed new diffuse bilateral groundglass opacities with mild left upper lobe consolidation in the differential includes pulmonary edema versus infectious or inflammatory etiology.  She continues to have small bilateral pleural effusions.   She also has a small pericardial effusion which has decreased in size compared to prior exams.  She is scheduled to see Dr. Kipp Brood on 05/03/2021 for this.  .   Dr. Julien Nordmann does not believe that the patient is in adequate shape to proceed with treatment today.  She also has leukocytosis on labs today.  Dr. Julien Nordmann recommends treating her for possible infection with doxycycline 100 mg twice daily for 2 weeks and a Medrol Dosepak for possible inflammatory etiology as well as to help her appetite due to her continued weight loss.  Due to her poor p.o. intake and tachycardia, Dr. Julien Nordmann recommends using her infusion time today to receive 1 L of fluid over 2 hours.  We will see her back for follow-up visit in 3 weeks for evaluation and consideration of resuming treatment if she has improvement in  her condition.  Dr. Julien Nordmann discussed that fortunately her restaging scan did not show any evidence of disease progression so it is adequate to wait 3 weeks and reassess her condition at that time.  She will continue to use supplemental drinks such as Ensure.  She was strongly encouraged to bring her portable oxygen tank to her appointments given her episode of hypoxia last week.  The patient was advised to call immediately if she has any concerning symptoms in the interval. The patient voices understanding of current disease status and treatment options and is in agreement with the current care plan. All questions were answered. The patient knows to call the clinic with any problems, questions or concerns. We can certainly see the patient much sooner if necessary     No orders of the defined types were placed in this encounter.     Liandra Mendia L Yasmyn Bellisario, PA-C 05/01/21  ADDENDUM: Hematology/Oncology Attending: I had a face-to-face encounter with the patient today.  I reviewed her record, lab, scans and recommended her care plan.  This is a very pleasant 66 years old African-American female with  metastatic non-small cell lung cancer, adenocarcinoma that was initially diagnosed as a stage IIIa in July 2019 status post a course of concurrent chemoradiation followed by consolidation treatment with Imfinzi but the patient had evidence for disease progression in October 2020 and she started systemic chemotherapy initially was induction carboplatin, Alimta and Keytruda for 4 cycles followed by maintenance Alimta and Keytruda for a total of 29 cycles.  Starting from cycle #21 her dose of Alimta was reduced to 400 Mg/M2 secondary to intolerance.  The patient continues to complain of increasing fatigue and weakness as well as lack of appetite and weight loss in addition to the baseline shortness of breath and tachycardia. She had repeat CT scan of the chest, abdomen and pelvis on April 24, 2021 that showed stable postradiation changes in the perihilar right lung but there was new diffuse bilateral groundglass opacity with mild left upper lobe consolidation and small bilateral pleural effusions and small pericardial effusion and no evidence of metastatic disease in the abdomen and pelvis. I recommended for the patient to hold her treatment with systemic therapy for now until improvement of her general condition as she has been declining rapidly in the last several weeks.  We will start the patient empirically on doxycycline for the questionable airspace disease in the lung.  We will also give her Medrol Dosepak to boost her appetite in the interval. For the lack of appetite and dehydration, I will arrange for the patient to receive 1 L of normal saline in the clinic today. Will arrange for the patient to come back for follow-up visit in 3 weeks for reevaluation of her condition before resuming any systemic therapy. For the bilateral pleural effusion, she has a follow-up appointment with Dr. Valeta Harms for evaluation and consideration of Pleurx catheter placement if needed. The patient was also advised to call  immediately if she has any other concerning symptoms in the interval. The total time spent in the appointment was 40 minutes.  Disclaimer: This note was dictated with voice recognition software. Similar sounding words can inadvertently be transcribed and may be missed upon review. Eilleen Kempf, MD 05/01/21

## 2021-05-01 ENCOUNTER — Inpatient Hospital Stay: Payer: Medicare PPO

## 2021-05-01 ENCOUNTER — Encounter: Payer: Self-pay | Admitting: Physician Assistant

## 2021-05-01 ENCOUNTER — Inpatient Hospital Stay: Payer: Medicare PPO | Admitting: Physician Assistant

## 2021-05-01 ENCOUNTER — Other Ambulatory Visit: Payer: Self-pay

## 2021-05-01 ENCOUNTER — Telehealth: Payer: Self-pay

## 2021-05-01 ENCOUNTER — Inpatient Hospital Stay: Payer: Medicare PPO | Attending: Internal Medicine

## 2021-05-01 VITALS — BP 129/74 | HR 120 | Temp 97.9°F | Resp 18 | Ht 60.0 in | Wt 94.3 lb

## 2021-05-01 DIAGNOSIS — M069 Rheumatoid arthritis, unspecified: Secondary | ICD-10-CM | POA: Insufficient documentation

## 2021-05-01 DIAGNOSIS — D72829 Elevated white blood cell count, unspecified: Secondary | ICD-10-CM | POA: Diagnosis not present

## 2021-05-01 DIAGNOSIS — C3491 Malignant neoplasm of unspecified part of right bronchus or lung: Secondary | ICD-10-CM

## 2021-05-01 DIAGNOSIS — C3431 Malignant neoplasm of lower lobe, right bronchus or lung: Secondary | ICD-10-CM | POA: Insufficient documentation

## 2021-05-01 DIAGNOSIS — R64 Cachexia: Secondary | ICD-10-CM | POA: Insufficient documentation

## 2021-05-01 DIAGNOSIS — I2510 Atherosclerotic heart disease of native coronary artery without angina pectoris: Secondary | ICD-10-CM | POA: Diagnosis not present

## 2021-05-01 DIAGNOSIS — E860 Dehydration: Secondary | ICD-10-CM

## 2021-05-01 DIAGNOSIS — Z79899 Other long term (current) drug therapy: Secondary | ICD-10-CM | POA: Insufficient documentation

## 2021-05-01 DIAGNOSIS — M5030 Other cervical disc degeneration, unspecified cervical region: Secondary | ICD-10-CM | POA: Diagnosis not present

## 2021-05-01 DIAGNOSIS — E559 Vitamin D deficiency, unspecified: Secondary | ICD-10-CM | POA: Insufficient documentation

## 2021-05-01 DIAGNOSIS — J910 Malignant pleural effusion: Secondary | ICD-10-CM | POA: Insufficient documentation

## 2021-05-01 DIAGNOSIS — Z87891 Personal history of nicotine dependence: Secondary | ICD-10-CM | POA: Insufficient documentation

## 2021-05-01 DIAGNOSIS — I700 Atherosclerosis of aorta: Secondary | ICD-10-CM | POA: Diagnosis not present

## 2021-05-01 DIAGNOSIS — I3139 Other pericardial effusion (noninflammatory): Secondary | ICD-10-CM | POA: Insufficient documentation

## 2021-05-01 DIAGNOSIS — J90 Pleural effusion, not elsewhere classified: Secondary | ICD-10-CM

## 2021-05-01 DIAGNOSIS — Z85841 Personal history of malignant neoplasm of brain: Secondary | ICD-10-CM | POA: Insufficient documentation

## 2021-05-01 DIAGNOSIS — R0602 Shortness of breath: Secondary | ICD-10-CM | POA: Insufficient documentation

## 2021-05-01 DIAGNOSIS — D649 Anemia, unspecified: Secondary | ICD-10-CM | POA: Insufficient documentation

## 2021-05-01 DIAGNOSIS — Z923 Personal history of irradiation: Secondary | ICD-10-CM | POA: Diagnosis not present

## 2021-05-01 DIAGNOSIS — K219 Gastro-esophageal reflux disease without esophagitis: Secondary | ICD-10-CM | POA: Diagnosis not present

## 2021-05-01 DIAGNOSIS — C771 Secondary and unspecified malignant neoplasm of intrathoracic lymph nodes: Secondary | ICD-10-CM | POA: Insufficient documentation

## 2021-05-01 DIAGNOSIS — R531 Weakness: Secondary | ICD-10-CM | POA: Insufficient documentation

## 2021-05-01 DIAGNOSIS — R000 Tachycardia, unspecified: Secondary | ICD-10-CM | POA: Insufficient documentation

## 2021-05-01 DIAGNOSIS — R5383 Other fatigue: Secondary | ICD-10-CM | POA: Insufficient documentation

## 2021-05-01 DIAGNOSIS — Z7952 Long term (current) use of systemic steroids: Secondary | ICD-10-CM | POA: Insufficient documentation

## 2021-05-01 DIAGNOSIS — Z5112 Encounter for antineoplastic immunotherapy: Secondary | ICD-10-CM | POA: Diagnosis not present

## 2021-05-01 DIAGNOSIS — J432 Centrilobular emphysema: Secondary | ICD-10-CM | POA: Insufficient documentation

## 2021-05-01 LAB — CBC WITH DIFFERENTIAL (CANCER CENTER ONLY)
Abs Immature Granulocytes: 0.11 10*3/uL — ABNORMAL HIGH (ref 0.00–0.07)
Basophils Absolute: 0.1 10*3/uL (ref 0.0–0.1)
Basophils Relative: 0 %
Eosinophils Absolute: 0 10*3/uL (ref 0.0–0.5)
Eosinophils Relative: 0 %
HCT: 28.6 % — ABNORMAL LOW (ref 36.0–46.0)
Hemoglobin: 9.6 g/dL — ABNORMAL LOW (ref 12.0–15.0)
Immature Granulocytes: 1 %
Lymphocytes Relative: 3 %
Lymphs Abs: 0.7 10*3/uL (ref 0.7–4.0)
MCH: 34.4 pg — ABNORMAL HIGH (ref 26.0–34.0)
MCHC: 33.6 g/dL (ref 30.0–36.0)
MCV: 102.5 fL — ABNORMAL HIGH (ref 80.0–100.0)
Monocytes Absolute: 2.4 10*3/uL — ABNORMAL HIGH (ref 0.1–1.0)
Monocytes Relative: 12 %
Neutro Abs: 17.5 10*3/uL — ABNORMAL HIGH (ref 1.7–7.7)
Neutrophils Relative %: 84 %
Platelet Count: 497 10*3/uL — ABNORMAL HIGH (ref 150–400)
RBC: 2.79 MIL/uL — ABNORMAL LOW (ref 3.87–5.11)
RDW: 21.6 % — ABNORMAL HIGH (ref 11.5–15.5)
WBC Count: 20.8 10*3/uL — ABNORMAL HIGH (ref 4.0–10.5)
nRBC: 0 % (ref 0.0–0.2)

## 2021-05-01 LAB — CMP (CANCER CENTER ONLY)
ALT: 13 U/L (ref 0–44)
AST: 31 U/L (ref 15–41)
Albumin: 2.9 g/dL — ABNORMAL LOW (ref 3.5–5.0)
Alkaline Phosphatase: 68 U/L (ref 38–126)
Anion gap: 9 (ref 5–15)
BUN: 18 mg/dL (ref 8–23)
CO2: 26 mmol/L (ref 22–32)
Calcium: 10.9 mg/dL — ABNORMAL HIGH (ref 8.9–10.3)
Chloride: 100 mmol/L (ref 98–111)
Creatinine: 1.24 mg/dL — ABNORMAL HIGH (ref 0.44–1.00)
GFR, Estimated: 48 mL/min — ABNORMAL LOW (ref >60)
Glucose, Bld: 114 mg/dL — ABNORMAL HIGH (ref 70–99)
Potassium: 3.6 mmol/L (ref 3.5–5.1)
Sodium: 135 mmol/L (ref 135–145)
Total Bilirubin: 0.5 mg/dL (ref 0.3–1.2)
Total Protein: 7.5 g/dL (ref 6.5–8.1)

## 2021-05-01 LAB — SAMPLE TO BLOOD BANK

## 2021-05-01 LAB — TSH: TSH: 2.545 u[IU]/mL (ref 0.308–3.960)

## 2021-05-01 MED ORDER — METHYLPREDNISOLONE 4 MG PO TBPK: ORAL_TABLET | ORAL | 0 refills | Status: DC

## 2021-05-01 MED ORDER — DOXYCYCLINE HYCLATE 100 MG PO TABS: 100.0000 mg | ORAL_TABLET | Freq: Two times a day (BID) | ORAL | 0 refills | Status: DC

## 2021-05-01 MED ORDER — SODIUM CHLORIDE 0.9 % IV SOLN: Freq: Once | INTRAVENOUS | Status: AC

## 2021-05-01 NOTE — Telephone Encounter (Signed)
Pt arrived to the [facility] with no O2. I asked pt if she drove here with no O2 and she indicated "I was driven here with no O2". I verified with the pt that she does have the portable O2 that was ordered for her and she is is clear and expressed understanding that she is supposed to wear the O2, especially when traveling.

## 2021-05-01 NOTE — Patient Instructions (Signed)

## 2021-05-03 ENCOUNTER — Other Ambulatory Visit: Payer: Self-pay

## 2021-05-03 ENCOUNTER — Ambulatory Visit (INDEPENDENT_AMBULATORY_CARE_PROVIDER_SITE_OTHER): Payer: Self-pay | Admitting: Thoracic Surgery (Cardiothoracic Vascular Surgery)

## 2021-05-03 DIAGNOSIS — C3491 Malignant neoplasm of unspecified part of right bronchus or lung: Secondary | ICD-10-CM

## 2021-05-03 NOTE — Progress Notes (Signed)
°   °  [address]       [place], [id number]             [phone]       Patient: Home Provider: Office Consent for Telemedicine visit obtained.  Todays visit was completed via a real-time telehealth (see specific modality noted below). The patient/authorized person provided oral consent at the time of the visit to engage in a telemedicine encounter with the present provider at [facility]. The patient/authorized person was informed of the potential benefits, limitations, and risks of telemedicine. The patient/authorized person expressed understanding that the laws that protect confidentiality also apply to telemedicine. The patient/authorized person acknowledged understanding that telemedicine does not provide emergency services and that he or she would need to call 911 or proceed to the nearest hospital for help if such a need arose.   Total time spent in the clinical discussion 10 minutes.  Telehealth Modality: Phone visit (audio only)  I had a telephone visit with Susan Holder.  She did meet with Dr. Valeta Harms that she had too much pain while undergoing a thoracentesis.  She states that she is not able to tolerate this.  She did have several questions about the Pleurx and is currently considering this.  We will bring her in to clinic next week to give her more information about this.

## 2021-05-06 ENCOUNTER — Ambulatory Visit: Payer: Medicare PPO | Admitting: Acute Care

## 2021-05-08 ENCOUNTER — Other Ambulatory Visit: Payer: Self-pay

## 2021-05-08 ENCOUNTER — Other Ambulatory Visit: Payer: Self-pay | Admitting: *Deleted

## 2021-05-08 ENCOUNTER — Ambulatory Visit (INDEPENDENT_AMBULATORY_CARE_PROVIDER_SITE_OTHER): Payer: Self-pay | Admitting: Thoracic Surgery (Cardiothoracic Vascular Surgery)

## 2021-05-08 VITALS — BP 125/75 | HR 100 | Resp 20 | Ht 60.0 in | Wt 94.0 lb

## 2021-05-08 DIAGNOSIS — C3491 Malignant neoplasm of unspecified part of right bronchus or lung: Secondary | ICD-10-CM

## 2021-05-08 DIAGNOSIS — J90 Pleural effusion, not elsewhere classified: Secondary | ICD-10-CM

## 2021-05-08 NOTE — H&P (View-Only) (Signed)
° °   °  [address]       [place] [address]             [phone]        Milinda C Langland [place] Medical Record #[id number] Date of Birth: 08/26/1955  Referring: Revankar, Reita Cliche, MD Primary Care: [name], DO (Inactive) Primary Cardiologist:None  Reason for visit:   follow-up  History of Present Illness:     66 yo female presents for discussion of management of her recurrent pleural effusion.  She remains quite dyspneic and is on supplemental O2 at this point.  Physical Exam: BP 125/75 (BP Location: Right Arm, Patient Position: Sitting)    Pulse 100    Resp 20    Ht 5' (1.524 m)    Wt 94 lb (42.6 kg)    LMP 04/28/2000    SpO2 98% Comment: 2L O2 [place]   BMI 18.36 kg/m   Alert NAD Frail and deconditioned Presents in a wheelchair Tachycardic Mild increased work of breathing       Assessment / Plan:   66 yo female with stage IV lung cancer, hx of pericardial window, and recurrent left pleural effusions.  We discussed the risks and benefits for pleurx catheter placement.  She is agreeable to proceed.  She also has a recurrent right effusion, but since she has had radiation to the right lung, it is unlikely to resolve with drainage.  We will admit her for observation for catheter care teaching post operatively.   Lajuana Matte 05/08/2021 4:30 PM

## 2021-05-08 NOTE — Progress Notes (Signed)
° °   °  [address]       [place] [address]             [phone]        Kyna C Helgeson [place] Medical Record #[id number] Date of Birth: [birth year]  Referring: Revankar, Reita Cliche, MD Primary Care: [name], DO (Inactive) Primary Cardiologist:None  Reason for visit:   follow-up  History of Present Illness:     66 yo female presents for discussion of management of her recurrent pleural effusion.  She remains quite dyspneic and is on supplemental O2 at this point.  Physical Exam: BP 125/75 (BP Location: Right Arm, Patient Position: Sitting)    Pulse 100    Resp 20    Ht 5' (1.524 m)    Wt 94 lb (42.6 kg)    LMP 04/28/2000    SpO2 98% Comment: 2L O2 [place]   BMI 18.36 kg/m   Alert NAD Frail and deconditioned Presents in a wheelchair Tachycardic Mild increased work of breathing       Assessment / Plan:   66 yo female with stage IV lung cancer, hx of pericardial window, and recurrent left pleural effusions.  We discussed the risks and benefits for pleurx catheter placement.  She is agreeable to proceed.  She also has a recurrent right effusion, but since she has had radiation to the right lung, it is unlikely to resolve with drainage.  We will admit her for observation for catheter care teaching post operatively.   Lajuana Matte 05/08/2021 4:30 PM

## 2021-05-10 ENCOUNTER — Other Ambulatory Visit: Payer: Self-pay

## 2021-05-10 ENCOUNTER — Ambulatory Visit (HOSPITAL_COMMUNITY)
Admission: RE | Admit: 2021-05-10 | Discharge: 2021-05-10 | Disposition: A | Discharge status: Home / Self Care | Payer: Medicare PPO | Source: Ambulatory Visit | Attending: Thoracic Surgery (Cardiothoracic Vascular Surgery) | Admitting: Thoracic Surgery (Cardiothoracic Vascular Surgery)

## 2021-05-10 ENCOUNTER — Encounter (HOSPITAL_COMMUNITY)
Admission: RE | Admit: 2021-05-10 | Discharge: 2021-05-10 | Disposition: A | Discharge status: Home / Self Care | Payer: Medicare PPO | Source: Ambulatory Visit | Attending: Thoracic Surgery (Cardiothoracic Vascular Surgery) | Admitting: Thoracic Surgery (Cardiothoracic Vascular Surgery)

## 2021-05-10 ENCOUNTER — Encounter (HOSPITAL_COMMUNITY): Payer: Self-pay

## 2021-05-10 DIAGNOSIS — I3139 Other pericardial effusion (noninflammatory): Secondary | ICD-10-CM | POA: Insufficient documentation

## 2021-05-10 DIAGNOSIS — Z01818 Encounter for other preprocedural examination: Secondary | ICD-10-CM | POA: Insufficient documentation

## 2021-05-10 DIAGNOSIS — J449 Chronic obstructive pulmonary disease, unspecified: Secondary | ICD-10-CM | POA: Diagnosis not present

## 2021-05-10 DIAGNOSIS — J90 Pleural effusion, not elsewhere classified: Secondary | ICD-10-CM

## 2021-05-10 DIAGNOSIS — Z85118 Personal history of other malignant neoplasm of bronchus and lung: Secondary | ICD-10-CM | POA: Diagnosis not present

## 2021-05-10 DIAGNOSIS — Z87891 Personal history of nicotine dependence: Secondary | ICD-10-CM | POA: Diagnosis not present

## 2021-05-10 DIAGNOSIS — I2510 Atherosclerotic heart disease of native coronary artery without angina pectoris: Secondary | ICD-10-CM | POA: Insufficient documentation

## 2021-05-10 DIAGNOSIS — D649 Anemia, unspecified: Secondary | ICD-10-CM | POA: Diagnosis not present

## 2021-05-10 HISTORY — DX: Chronic obstructive pulmonary disease, unspecified: J44.9

## 2021-05-10 HISTORY — DX: Dyspnea, unspecified: R06.00

## 2021-05-10 LAB — CBC
HCT: 30.9 % — ABNORMAL LOW (ref 36.0–46.0)
Hemoglobin: 9.9 g/dL — ABNORMAL LOW (ref 12.0–15.0)
MCH: 34.7 pg — ABNORMAL HIGH (ref 26.0–34.0)
MCHC: 32 g/dL (ref 30.0–36.0)
MCV: 108.4 fL — ABNORMAL HIGH (ref 80.0–100.0)
Platelets: 384 10*3/uL (ref 150–400)
RBC: 2.85 MIL/uL — ABNORMAL LOW (ref 3.87–5.11)
RDW: 21.3 % — ABNORMAL HIGH (ref 11.5–15.5)
WBC: 11.8 10*3/uL — ABNORMAL HIGH (ref 4.0–10.5)
nRBC: 0 % (ref 0.0–0.2)

## 2021-05-10 LAB — COMPREHENSIVE METABOLIC PANEL
ALT: 17 U/L (ref 0–44)
AST: 27 U/L (ref 15–41)
Albumin: 2.4 g/dL — ABNORMAL LOW (ref 3.5–5.0)
Alkaline Phosphatase: 61 U/L (ref 38–126)
Anion gap: 10 (ref 5–15)
BUN: 16 mg/dL (ref 8–23)
CO2: 30 mmol/L (ref 22–32)
Calcium: 10.4 mg/dL — ABNORMAL HIGH (ref 8.9–10.3)
Chloride: 99 mmol/L (ref 98–111)
Creatinine, Ser: 1.21 mg/dL — ABNORMAL HIGH (ref 0.44–1.00)
GFR, Estimated: 49 mL/min — ABNORMAL LOW (ref >60)
Glucose, Bld: 100 mg/dL — ABNORMAL HIGH (ref 70–99)
Potassium: 3.9 mmol/L (ref 3.5–5.1)
Sodium: 139 mmol/L (ref 135–145)
Total Bilirubin: 0.3 mg/dL (ref 0.3–1.2)
Total Protein: 6.3 g/dL — ABNORMAL LOW (ref 6.5–8.1)

## 2021-05-10 LAB — SURGICAL PCR SCREEN
MRSA, PCR: NEGATIVE
Staphylococcus aureus: NEGATIVE

## 2021-05-10 LAB — APTT: aPTT: 21 seconds — ABNORMAL LOW (ref 24–36)

## 2021-05-10 LAB — PROTIME-INR
INR: 0.9 (ref 0.8–1.2)
Prothrombin Time: 12.6 seconds (ref 11.4–15.2)

## 2021-05-10 NOTE — Progress Notes (Signed)
PCP - seen at [facility], [place].  Susan Holder reports that her Dr. left the practice, she saw Susan Pain, NP  Cardiologist - Dr. Barrie Lyme saw last 11/21/20, was to follow up in 2 months- she has not followed up.  EP-no  Endocrine-no  Pulm- 2019 saw  Dr Janeece Fitting  Chest x-ray - 05/10/20  EKG - 04/24/21  Stress Test - 05/10/21  ECHO - 11/09/20  Cardiac Cath - no  AICD-no PM-no LOOP-no  Dialysis-  Sleep Study - no CPAP - no  LABS-CBC, CMP, PT, PTT, PCR.  ASA-no  ERAS-no  HA1C-na Fasting Blood Sugar - na Checks Blood Sugar __0___ times a day  Anesthesia-  Pt denies having chest Holder or sob,sob, or fever at this time. All instructions explained to the pt, with a verbal understanding of the material. Pt agrees to go over the instructions while at home for a better understanding. Pt also instructed to self quarantine after being tested for COVID-19. The opportunity to ask questions was provided.  Susan Holder does become short of breath easily, she wears oxygen ar 2 liters.

## 2021-05-10 NOTE — Pre-Procedure Instructions (Signed)
Susan Holder  05/10/2021      Your procedure is scheduled on Monday, January 16.  Report to [facility], Main Entrance or Entrance "A" at 10:00.                Your surgery or procedure is scheduled to begin at 12:00 Noon   Call this number if you have problems the morning of surgery: [phone]  This is the number for the Pre- Surgical Desk.                For any other questions, please call [phone], Monday - Friday 8 AM - 4 PM.   Remember:  Do not eat or drink after midnight. Sunday,January 15.    Take these medicines the morning of surgery with A SIP OF WATER: hydroxychloroquine (PLAQUENIL) omeprazole (PRILOSEC)  Take if needed: prochlorperazine (COMPAZINE)  STOP taking Aspirin, Aspirin Products (Goody Powder, Excedrin Migraine), Ibuprofen (Advil), Naproxen (Aleve), Vitamins and Herbal Products (ie Fish Oil).    Special instructions:    [facility]- Preparing For Surgery  Before surgery, you can play an important role. Because skin is not sterile, your skin needs to be as free of germs as possible. You can reduce the number of germs on your skin by washing with CHG (chlorahexidine gluconate) Soap before surgery.  CHG is an antiseptic cleaner which kills germs and bonds with the skin to continue killing germs even after washing.    Oral Hygiene is also important to reduce your risk of infection.  Remember - BRUSH YOUR TEETH THE MORNING OF SURGERY WITH YOUR REGULAR TOOTHPASTE  Please do not use if you have an allergy to CHG or antibacterial soaps. If your skin becomes reddened/irritated stop using the CHG.  Do not shave (including legs and underarms) for at least 48 hours prior to first CHG shower. It is OK to shave your face.  Please follow these instructions carefully.   Shower the NIGHT BEFORE SURGERY and the MORNING OF SURGERY with CHG.   If you chose to wash your hair, wash your hair first as usual with your normal shampoo.  After you shampoo,  wash your face and private area with the soap you use at home, then rinse your hair and body thoroughly to remove the shampoo and soap.  Use CHG as you would any other liquid soap. You can apply CHG directly to the skin and wash gently with a scrungie or a clean washcloth.   Apply the CHG Soap to your body ONLY FROM THE NECK DOWN.  Do not use on open wounds or open sores. Avoid contact with your eyes, ears, mouth and genitals (private parts).   Wash thoroughly, paying special attention to the area where your surgery will be performed.  Thoroughly rinse your body with warm water from the neck down.  DO NOT shower/wash with your normal soap after using and rinsing off the CHG Soap.  Pat yourself dry with a CLEAN TOWEL.  Wear CLEAN PAJAMAS to bed the night before surgery, wear comfortable clothes the morning of surgery  Place CLEAN SHEETS on your bed the night of your first shower and DO NOT SLEEP WITH PETS.  Day of Surgery: Shower as instructed above. Do not apply any deodorants/lotions, powders or colognes.  Please wear clean clothes to the hospital/surgery center.   Remember to brush your teeth WITH YOUR REGULAR TOOTHPASTE.  Do not wear jewelry, make-up or nail polish.  Do not shave 48 hours  prior to surgery.  Men may shave face and neck.  Do not bring valuables to the hospital.  [facility] is not responsible for any belongings or valuables.  Contacts, dentures or bridgework may not be worn into surgery.  Leave your suitcase in the car.  After surgery it may be brought to your room.  For patients admitted to the hospital, discharge time will be determined by your treatment team.  Patients discharged the day of surgery will not be allowed to drive home.   Please read over the fact sheets that you were given.

## 2021-05-10 NOTE — Progress Notes (Addendum)
PCP -   Cardiologist -   EP-no  Endocrine-no  Pulm-  Chest x-ray -   EKG - 04/24/21  Stress Test - 05/10/21  ECHO - 11/09/20  Cardiac Cath - no  AICD-no PM-no LOOP-no  Dialysis-  Sleep Study - no CPAP - no  LABS-CBC, CMP, PT, PTT, PCR. Urine for pregnancy DOS  ASA-no  ERAS-no  HA1C-na Fasting Blood Sugar - na Checks Blood Sugar __0___ times a day  Anesthesia-  Pt denies having chest pain, sob, or fever at this time. All instructions explained to the pt, with a verbal understanding of the material. Pt agrees to go over the instructions while at home for a better understanding. Pt also instructed to self quarantine after being tested for COVID-19. The opportunity to ask questions was provided.

## 2021-05-10 NOTE — Progress Notes (Signed)
Anesthesia Chart Review:   Case: [id number] Date/Time: 05/13/21 1148   Procedure: INSERTION PLEURAL DRAINAGE CATHETER (Left)   Anesthesia type: Monitor Anesthesia Care   Pre-op diagnosis: LEFT PLEURAL EFFUSION   Location: [facility] OR ROOM 10 / [facility]   Surgeons: Lajuana Matte, MD       DISCUSSION: Patient is a 66 year old female scheduled for the above procedure. She has stage IV lung cancer. S/p pericardial window and right pleurocentesis 02/07/21. At 05/08/21 follow-up with Dr. Kipp Brood. She remained SOB and using 2L O2. Left Pleurex catheter recommended for recurrent pleural effusions. She had radiation to the right lung, "but since she has had radiation to the right lung, it is unlikely to resolve with drainage." He is considering overnight observation with catheter care teaching.   History includes former smoker (quit 09/29/17), COPD, stage IV non-small cell lung cancer (initially diagnosed as stage IIIA 10/2017 s/p chemotherapy & right lung radiation 11/30/17-01/11/18; stage IV ~ 10/2018, s/p right supraclavicular radiation 11/16/18-12/20/18 & SRS for brain mets 03/09/19), RA, coronary calcifications (on imaging), pericardial effusion (s/p pericardial window 02/07/21), pleural effusion (s/p right pleurocentesis 02/07/21; right thoracentesis 04/03/21 ), anemia (s/p PRBC 04/24/21).  Last visit with cardiologist Dr. Geraldo Pitter was on 11/21/20 aortic atherosclerosis and coronary calcifications on imaging, pericardial effusion in setting of stage IV lung cancer. He referred her to CT surgery for consideration of pericardial window.   05/10/21 CXR is still in process. Anesthesia team to evaluate on the day of surgery.   Patient did not get a COVID-19 test at her PAT visit, as case currently posted as ambulatory. Dr. Kipp Brood is out of the office today, so staff will clarify on the morning of surgery. If admission is planned, then she will need a same day COVID-19 test.    VS: BP (P) 129/60    Pulse (!) (P) 110     Temp (P) 36.8 C (Oral)    Resp (P) 17    Ht (P) 5' (1.524 m)    Wt (P) 45.4 kg    LMP 04/28/2000    SpO2 (P) 100%    BMI (P) 19.53 kg/m    PROVIDERS: PCP is with [facility] in [place].  Jyl Heinz, MD is cardiologist June Leap, DO & Marshell Garfinkel, MD are pulmonologists Curt Bears, MD is [facility] Kyung Rudd, MD is [facility] Bo Merino, MD is rheumatologist   LABS: Preoperative labs noted. Cr stable at 1.21. Ca elevated at 10.4, down from 10.9 on 05/01/21. HGB 9.9, which is consistent with results since 04/24/21.  (all labs ordered are listed, but only abnormal results are displayed)  Labs Reviewed  COMPREHENSIVE METABOLIC PANEL - Abnormal; Notable for the following components:      Result Value   Glucose, Bld 100 (*)    Creatinine, Ser 1.21 (*)    Calcium 10.4 (*)    Total Protein 6.3 (*)    Albumin 2.4 (*)    GFR, Estimated 49 (*)    All other components within normal limits  CBC - Abnormal; Notable for the following components:   WBC 11.8 (*)    RBC 2.85 (*)    Hemoglobin 9.9 (*)    HCT 30.9 (*)    MCV 108.4 (*)    MCH 34.7 (*)    RDW 21.3 (*)    All other components within normal limits  APTT - Abnormal; Notable for the following components:   aPTT 21 (*)    All other components within normal limits  SURGICAL PCR SCREEN  PROTIME-INR     IMAGES: 2V CXR 05/10/21: CXR in process.   1V PCXR 04/24/21: IMPRESSION: 1. Moderate-sized bilateral pleural effusions without gross change, difficult to assess due to different techniques and possible decreased inspiration. 2. Decreased right upper lobe atelectasis and possible scarring.   CT Chest/abd/pelvis 04/24/21: IMPRESSION: 1. Stable postradiation changes of the perihilar right lung. 2. New diffuse bilateral ground-glass opacities with mild left upper lobe consolidations, differential includes pulmonary edema versus infectious or inflammatory etiology. Recommend attention  on follow-up per clinical protocol. 3. Small bilateral pleural effusions, slightly decreased in size on the right when compared to prior exam and increased in size on the left. 4. Small pericardial effusion, decreased in size when compared to prior exam. 5. No evidence of metastatic disease in the abdomen or pelvis. 6. Aortic Atherosclerosis (ICD10-I70.0) and Emphysema (ICD10-J43.9).   MRI Brain 03/01/21: IMPRESSION: Stable appearance of treated lesions.  No new lesion.   EKG: 04/24/21: ST at 107 bpm    CV: Echo 11/09/20: IMPRESSIONS   1. Left ventricular ejection fraction, by estimation, is 40 to 45%. The  left ventricle has mildly decreased function. The left ventricle  demonstrates global hypokinesis. The left ventricular internal cavity size  was mildly dilated. Left ventricular  diastolic parameters are consistent with Grade I diastolic dysfunction  (impaired relaxation).   2. Right ventricular systolic function is moderately reduced. The right  ventricular size is normal. There is normal pulmonary artery systolic  pressure.   3. There is a moderate circumferential pericardial effusion. There is no  collapse of the RV or RA. The MV inflow pattern is normal. There is no  evidence of pericardial tamponade but I would recommend close clinical  follow up for further evaluation . Marland Kitchen  Moderate pericardial effusion. The pericardial effusion is  circumferential. There is no evidence of cardiac tamponade.   4. The mitral valve is grossly normal. No evidence of mitral valve  regurgitation.   5. The aortic valve is grossly normal. Aortic valve regurgitation is  moderate.   6. The IVC is normal size. There is inspiratory collapse of the IVC. Marland Kitchen  The inferior vena cava is normal in size with greater than 50% respiratory  variability, suggesting right atrial pressure of 3 mmHg.  (S/p pericardial window and right pleurocentesis 02/07/2021)   Past Medical History:  Diagnosis Date    Acid reflux 09/21/2018   Adenocarcinoma of right lung, stage 4 (HCC) 11/17/2017   Allergic rhinitis 08/22/2013   ANA positive 05/29/2016   Aortic atherosclerosis ([place]) 10/23/2020   Brain metastases ([place]) 03/29/2019   COPD (chronic obstructive pulmonary disease) ([place])    Coronary artery calcification seen on CT scan 10/23/2020   DDD (degenerative disc disease), cervical 05/29/2016   Dyspnea    Encounter for antineoplastic chemotherapy 11/17/2017   Encounter for antineoplastic immunotherapy 02/01/2018   Goals of care, counseling/discussion 11/17/2017   High risk medication use 05/29/2016   04/29/2016: ==> plq 200 am & 100 qhs(adeq response).   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[place]) 11/04/2018   Other fatigue 05/29/2016   Pericardial effusion 10/23/2020   Primary malignant neoplasm of bronchus of right lower lobe ([place]) 12/21/2017   Rheumatoid arthritis (HCC)    Rheumatoid arthritis involving multiple sites with positive rheumatoid factor (  [place]) 05/29/2016   +RF +ANA +CCP    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   Smoker 05/29/2016   Trigger finger, left ring finger 05/29/2016   Trigger finger, right ring finger 05/29/2016   Vitamin D deficiency 05/29/2016    Past Surgical History:  Procedure Laterality Date   BRONCHIAL NEEDLE ASPIRATION BIOPSY  11/09/2017   Procedure: BRONCHIAL NEEDLE ASPIRATION BIOPSIES;  Surgeon: Marshell Garfinkel, MD;  Location: [facility];  Service: Cardiopulmonary;;   ENDOBRONCHIAL ULTRASOUND Bilateral 11/09/2017   Procedure: ENDOBRONCHIAL ULTRASOUND;  Surgeon: Marshell Garfinkel, MD;  Location: [facility];  Service: Cardiopulmonary;  Laterality: Bilateral;   NASAL SINUS SURGERY     NASAL SINUS SURGERY     THORACENTESIS Right 02/07/2021   Procedure: THORACENTESIS;  Surgeon: Lajuana Matte, MD;  Location: [facility] OR;  Service: Thoracic;  Laterality: Right;   TUBAL LIGATION     VIDEO BRONCHOSCOPY  11/09/2017   Procedure: VIDEO BRONCHOSCOPY;  Surgeon: Marshell Garfinkel, MD;  Location: [facility];  Service: Cardiopulmonary;;   XI ROBOTIC ASSISTED PERICARDIAL WINDOW Left 02/07/2021   Procedure: XI ROBOTIC ASSISTED THORACOSCOPY PERICARDIAL WINDOW;  Surgeon: Lajuana Matte, MD;  Location: [facility] OR;  Service: Thoracic;  Laterality: Left;    MEDICATIONS:  doxycycline (VIBRA-TABS) 174 MG tablet   folic acid (FOLVITE) 1 MG tablet   hydroxychloroquine (PLAQUENIL) 200 MG tablet   methylPREDNISolone (MEDROL DOSEPAK) 4 MG TBPK tablet   omeprazole (PRILOSEC) 20 MG capsule   prochlorperazine (COMPAZINE) 10 MG tablet   vitamin C (ASCORBIC ACID) 500 MG tablet   VITAMIN D PO   No current facility-administered medications for this encounter.    Myra Gianotti, PA-C Surgical Short Stay/Anesthesiology [facility] Phone [phone] [facility] Phone [phone] 05/10/2021 4:32 PM

## 2021-05-10 NOTE — Progress Notes (Signed)
PCP - seen at [facility], [place].  Susan Holder reports that her Dr. left the practice, she saw Jerline Pain, NP  Cardiologist - Dr. Barrie Lyme saw last 11/21/20, was to follow up in 2 months- she has not followed up.  EP-no  Endocrine-no  Pulm- 2019 saw  Dr Janeece Fitting  Chest x-ray - 05/10/20  EKG - 04/24/21  Stress Test - 05/10/21  ECHO - 11/09/20  Cardiac Cath - no  AICD-no PM-no LOOP-no  Dialysis-  Sleep Study - no CPAP - no  LABS-CBC, CMP, PT, PTT, PCR. Urine for pregnancy DOS  ASA-no  ERAS-no  HA1C-na Fasting Blood Sugar - na Checks Blood Sugar __0___ times a day  Anesthesia-  Pt denies having chest pain or sob,sob, or fever at this time. All instructions explained to the pt, with a verbal understanding of the material. Pt agrees to go over the instructions while at home for a better understanding. Pt also instructed to self quarantine after being tested for COVID-19. The opportunity to ask questions was provided.  Susan Holder does become short of breath easily, she wears oxygen ar 2 liters.

## 2021-05-13 ENCOUNTER — Observation Stay (HOSPITAL_COMMUNITY): Payer: Medicare PPO

## 2021-05-13 ENCOUNTER — Ambulatory Visit (HOSPITAL_COMMUNITY): Payer: Medicare PPO | Admitting: Vascular Surgery

## 2021-05-13 ENCOUNTER — Observation Stay (HOSPITAL_COMMUNITY)
Admission: RE | Admit: 2021-05-13 | Discharge: 2021-05-14 | Disposition: A | Discharge status: Home / Self Care | Payer: Medicare PPO | Attending: Thoracic Surgery (Cardiothoracic Vascular Surgery) | Admitting: Thoracic Surgery (Cardiothoracic Vascular Surgery)

## 2021-05-13 ENCOUNTER — Encounter (HOSPITAL_COMMUNITY): Payer: Self-pay | Admitting: Thoracic Surgery (Cardiothoracic Vascular Surgery)

## 2021-05-13 ENCOUNTER — Other Ambulatory Visit: Payer: Self-pay

## 2021-05-13 ENCOUNTER — Encounter (HOSPITAL_COMMUNITY)
Admission: RE | Disposition: A | Discharge status: Home / Self Care | Payer: Self-pay | Source: Home / Self Care | Attending: Thoracic Surgery (Cardiothoracic Vascular Surgery)

## 2021-05-13 ENCOUNTER — Ambulatory Visit (HOSPITAL_COMMUNITY): Payer: Medicare PPO

## 2021-05-13 ENCOUNTER — Telehealth: Payer: Self-pay | Admitting: Internal Medicine

## 2021-05-13 ENCOUNTER — Ambulatory Visit (HOSPITAL_COMMUNITY): Payer: Medicare PPO | Admitting: Anesthesiology

## 2021-05-13 DIAGNOSIS — Z20822 Contact with and (suspected) exposure to covid-19: Secondary | ICD-10-CM | POA: Insufficient documentation

## 2021-05-13 DIAGNOSIS — Z87891 Personal history of nicotine dependence: Secondary | ICD-10-CM | POA: Insufficient documentation

## 2021-05-13 DIAGNOSIS — D649 Anemia, unspecified: Secondary | ICD-10-CM | POA: Diagnosis not present

## 2021-05-13 DIAGNOSIS — J910 Malignant pleural effusion: Secondary | ICD-10-CM | POA: Diagnosis not present

## 2021-05-13 DIAGNOSIS — I2510 Atherosclerotic heart disease of native coronary artery without angina pectoris: Secondary | ICD-10-CM | POA: Insufficient documentation

## 2021-05-13 DIAGNOSIS — C3490 Malignant neoplasm of unspecified part of unspecified bronchus or lung: Secondary | ICD-10-CM | POA: Diagnosis not present

## 2021-05-13 DIAGNOSIS — J90 Pleural effusion, not elsewhere classified: Secondary | ICD-10-CM | POA: Diagnosis present

## 2021-05-13 DIAGNOSIS — C7931 Secondary malignant neoplasm of brain: Secondary | ICD-10-CM | POA: Diagnosis not present

## 2021-05-13 DIAGNOSIS — Z01818 Encounter for other preprocedural examination: Secondary | ICD-10-CM

## 2021-05-13 DIAGNOSIS — M069 Rheumatoid arthritis, unspecified: Secondary | ICD-10-CM | POA: Diagnosis not present

## 2021-05-13 DIAGNOSIS — Z938 Other artificial opening status: Secondary | ICD-10-CM

## 2021-05-13 HISTORY — PX: CHEST TUBE INSERTION: SHX231

## 2021-05-13 LAB — SARS CORONAVIRUS 2 BY RT PCR (HOSPITAL ORDER, PERFORMED IN ~~LOC~~ HOSPITAL LAB): SARS Coronavirus 2: NEGATIVE

## 2021-05-13 SURGERY — INSERTION, PLEURAL DRAINAGE CATHETER
Anesthesia: Monitor Anesthesia Care | Laterality: Left

## 2021-05-13 MED ORDER — LIDOCAINE HCL (PF) 1 % IJ SOLN
INTRAMUSCULAR | Status: DC | PRN
  Administered 2021-05-13: 10 mL

## 2021-05-13 MED ORDER — 0.9 % SODIUM CHLORIDE (POUR BTL) OPTIME
TOPICAL | Status: DC | PRN
  Administered 2021-05-13: 1000 mL

## 2021-05-13 MED ORDER — ONDANSETRON HCL 4 MG/2ML IJ SOLN: 4.0000 mg | Freq: Four times a day (QID) | INTRAMUSCULAR | Status: DC | PRN

## 2021-05-13 MED ORDER — PROPOFOL 10 MG/ML IV BOLUS
INTRAVENOUS | Status: DC | PRN
  Administered 2021-05-13: 20 mg via INTRAVENOUS

## 2021-05-13 MED ORDER — CHLORHEXIDINE GLUCONATE 0.12 % MT SOLN
15.0000 mL | Freq: Once | OROMUCOSAL | Status: AC
  Administered 2021-05-13: 15 mL via OROMUCOSAL
  Filled 2021-05-13: qty 15

## 2021-05-13 MED ORDER — CEFAZOLIN SODIUM-DEXTROSE 2-4 GM/100ML-% IV SOLN
2.0000 g | INTRAVENOUS | Status: AC
  Administered 2021-05-13: 2 g via INTRAVENOUS
  Filled 2021-05-13: qty 100

## 2021-05-13 MED ORDER — ACETAMINOPHEN 325 MG PO TABS
650.0000 mg | ORAL_TABLET | Freq: Four times a day (QID) | ORAL | Status: DC
  Administered 2021-05-13: 650 mg via ORAL
  Filled 2021-05-13 (×2): qty 2

## 2021-05-13 MED ORDER — FENTANYL CITRATE (PF) 250 MCG/5ML IJ SOLN
INTRAMUSCULAR | Status: DC | PRN
  Administered 2021-05-13: 25 ug via INTRAVENOUS

## 2021-05-13 MED ORDER — DEXAMETHASONE SODIUM PHOSPHATE 10 MG/ML IJ SOLN
INTRAMUSCULAR | Status: DC | PRN
  Administered 2021-05-13: 5 mg via INTRAVENOUS

## 2021-05-13 MED ORDER — ONDANSETRON HCL 4 MG/2ML IJ SOLN
INTRAMUSCULAR | Status: DC | PRN
  Administered 2021-05-13: 4 mg via INTRAVENOUS

## 2021-05-13 MED ORDER — ENOXAPARIN SODIUM 30 MG/0.3ML IJ SOSY
30.0000 mg | PREFILLED_SYRINGE | Freq: Every day | INTRAMUSCULAR | Status: DC
  Administered 2021-05-14: 30 mg via SUBCUTANEOUS
  Filled 2021-05-13: qty 0.3

## 2021-05-13 MED ORDER — MIDAZOLAM HCL 2 MG/2ML IJ SOLN
INTRAMUSCULAR | Status: AC
  Filled 2021-05-13: qty 2

## 2021-05-13 MED ORDER — LIDOCAINE 2% (20 MG/ML) 5 ML SYRINGE
INTRAMUSCULAR | Status: DC | PRN
  Administered 2021-05-13: 40 mg via INTRAVENOUS

## 2021-05-13 MED ORDER — ORAL CARE MOUTH RINSE: 15.0000 mL | Freq: Once | OROMUCOSAL | Status: AC

## 2021-05-13 MED ORDER — BISACODYL 5 MG PO TBEC
10.0000 mg | DELAYED_RELEASE_TABLET | Freq: Every day | ORAL | Status: DC
  Filled 2021-05-13: qty 2

## 2021-05-13 MED ORDER — ACETAMINOPHEN 500 MG PO TABS: 1000.0000 mg | ORAL_TABLET | Freq: Four times a day (QID) | ORAL | Status: DC

## 2021-05-13 MED ORDER — ACETAMINOPHEN 160 MG/5ML PO SOLN: 1000.0000 mg | Freq: Four times a day (QID) | ORAL | Status: DC

## 2021-05-13 MED ORDER — FENTANYL CITRATE (PF) 250 MCG/5ML IJ SOLN
INTRAMUSCULAR | Status: AC
  Filled 2021-05-13: qty 5

## 2021-05-13 MED ORDER — TRAMADOL HCL 50 MG PO TABS: 50.0000 mg | ORAL_TABLET | Freq: Four times a day (QID) | ORAL | Status: DC | PRN

## 2021-05-13 MED ORDER — ENSURE ENLIVE PO LIQD: 237.0000 mL | Freq: Two times a day (BID) | ORAL | Status: DC

## 2021-05-13 MED ORDER — SENNOSIDES-DOCUSATE SODIUM 8.6-50 MG PO TABS: 1.0000 | ORAL_TABLET | Freq: Every day | ORAL | Status: DC

## 2021-05-13 MED ORDER — ACETAMINOPHEN 160 MG/5ML PO SOLN: 650.0000 mg | Freq: Four times a day (QID) | ORAL | Status: DC

## 2021-05-13 MED ORDER — LIDOCAINE HCL (PF) 1 % IJ SOLN
INTRAMUSCULAR | Status: AC
  Filled 2021-05-13: qty 30

## 2021-05-13 MED ORDER — PROPOFOL 500 MG/50ML IV EMUL
INTRAVENOUS | Status: DC | PRN
Start: 2021-05-13 — End: 2021-05-13
  Administered 2021-05-13: 50 ug/kg/min via INTRAVENOUS

## 2021-05-13 MED ORDER — LACTATED RINGERS IV SOLN: INTRAVENOUS | Status: DC

## 2021-05-13 MED ORDER — MORPHINE SULFATE (PF) 2 MG/ML IV SOLN: 1.0000 mg | INTRAVENOUS | Status: DC | PRN

## 2021-05-13 SURGICAL SUPPLY — 29 items
BLADE CLIPPER SURG (BLADE) ×2 IMPLANT
CANISTER SUCT 3000ML PPV (MISCELLANEOUS) ×2 IMPLANT
COVER SURGICAL LIGHT HANDLE (MISCELLANEOUS) ×2 IMPLANT
DERMABOND ADVANCED (GAUZE/BANDAGES/DRESSINGS) ×1
DERMABOND ADVANCED .7 DNX12 (GAUZE/BANDAGES/DRESSINGS) ×1 IMPLANT
DRAPE LAPAROSCOPIC ABDOMINAL (DRAPES) ×2 IMPLANT
GLOVE SURG ENC TEXT LTX SZ7 (GLOVE) ×2 IMPLANT
GOWN STRL REUS W/ TWL LRG LVL3 (GOWN DISPOSABLE) ×1 IMPLANT
GOWN STRL REUS W/ TWL XL LVL3 (GOWN DISPOSABLE) ×1 IMPLANT
GOWN STRL REUS W/TWL LRG LVL3 (GOWN DISPOSABLE) ×2
GOWN STRL REUS W/TWL XL LVL3 (GOWN DISPOSABLE) ×2
KIT BASIN OR (CUSTOM PROCEDURE TRAY) ×2 IMPLANT
KIT PLEURX DRAIN CATH 1000ML (MISCELLANEOUS) ×1 IMPLANT
KIT PLEURX DRAIN CATH 15.5FR (DRAIN) ×2 IMPLANT
KIT TURNOVER KIT B (KITS) ×2 IMPLANT
NEEDLE 22X1 1/2 (OR ONLY) (NEEDLE) ×1 IMPLANT
NS IRRIG 1000ML POUR BTL (IV SOLUTION) ×2 IMPLANT
PACK GENERAL/GYN (CUSTOM PROCEDURE TRAY) ×2 IMPLANT
PAD ARMBOARD 7.5X6 YLW CONV (MISCELLANEOUS) ×4 IMPLANT
SET DRAINAGE LINE (MISCELLANEOUS) IMPLANT
SUT ETHILON 3 0 FSL (SUTURE) ×2 IMPLANT
SUT VIC AB 3-0 SH 27 (SUTURE) ×2
SUT VIC AB 3-0 SH 27X BRD (SUTURE) ×1 IMPLANT
SYR CONTROL 10ML LL (SYRINGE) ×2 IMPLANT
TOWEL GREEN STERILE (TOWEL DISPOSABLE) ×2 IMPLANT
TOWEL GREEN STERILE FF (TOWEL DISPOSABLE) ×2 IMPLANT
TUBE CONNECTING 12X1/4 (SUCTIONS) ×2 IMPLANT
VALVE REPLACEMENT CAP (MISCELLANEOUS) IMPLANT
WATER STERILE IRR 1000ML POUR (IV SOLUTION) ×2 IMPLANT

## 2021-05-13 NOTE — Plan of Care (Signed)
°  Problem: Education: Goal: Knowledge of General Education information will improve Description: Including pain rating scale, medication(s)/side effects and non-pharmacologic comfort measures Outcome: Progressing   Problem: Health Behavior/Discharge Planning: Goal: Ability to manage health-related needs will improve Outcome: Progressing   Problem: Clinical Measurements: Goal: Ability to maintain clinical measurements within normal limits will improve Outcome: Progressing Goal: Will remain free from infection Outcome: Progressing Goal: Diagnostic test results will improve Outcome: Progressing Goal: Respiratory complications will improve Outcome: Progressing Goal: Cardiovascular complication will be avoided Outcome: Progressing   Problem: Activity: Goal: Risk for activity intolerance will decrease Outcome: Progressing   Problem: Nutrition: Goal: Adequate nutrition will be maintained Outcome: Progressing   Problem: Coping: Goal: Level of anxiety will decrease Outcome: Progressing   Problem: Elimination: Goal: Will not experience complications related to bowel motility Outcome: Progressing Goal: Will not experience complications related to urinary retention Outcome: Progressing   Problem: Pain Managment: Goal: General experience of comfort will improve Outcome: Progressing   Problem: Safety: Goal: Ability to remain free from injury will improve Outcome: Progressing   Problem: Skin Integrity: Goal: Risk for impaired skin integrity will decrease Outcome: Progressing   Problem: Education: Goal: Knowledge of disease or condition will improve Outcome: Progressing Goal: Knowledge of the prescribed therapeutic regimen will improve Outcome: Progressing   Problem: Activity: Goal: Risk for activity intolerance will decrease Outcome: Progressing   Problem: Cardiac: Goal: Will achieve and/or maintain hemodynamic stability Outcome: Progressing   Problem: Clinical  Measurements: Goal: Postoperative complications will be avoided or minimized Outcome: Progressing   Problem: Respiratory: Goal: Respiratory status will improve Outcome: Progressing   Problem: Pain Management: Goal: Pain level will decrease Outcome: Progressing

## 2021-05-13 NOTE — Anesthesia Preprocedure Evaluation (Addendum)
Anesthesia Evaluation  Patient identified by MRN, date of birth, ID band Patient awake    Reviewed: Allergy & Precautions, NPO status , Patient's Chart, lab work & pertinent test results  Airway Mallampati: II  TM Distance: >3 FB Neck ROM: Full    Dental  (+) Teeth Intact, Dental Advisory Given   Pulmonary shortness of breath and Long-Term Oxygen Therapy, former smoker,  Left pleural effusion Primary malignant neoplasm of bronchus of right lower lobe--2L [place]   Pulmonary exam normal breath sounds clear to auscultation       Cardiovascular + CAD  Normal cardiovascular exam Rhythm:Regular Rate:Normal     Neuro/Psych Brain mets  negative psych ROS   GI/Hepatic Neg liver ROS, GERD  Medicated,  Endo/Other  negative endocrine ROS  Renal/GU negative Renal ROS     Musculoskeletal  (+) Arthritis , Rheumatoid disorders,    Abdominal   Peds  Hematology  (+) Blood dyscrasia, anemia ,   Anesthesia Other Findings Day of surgery medications reviewed with the patient.  Reproductive/Obstetrics                            Anesthesia Physical Anesthesia Plan  ASA: 4  Anesthesia Plan: MAC   Post-op Pain Management:    Induction: Intravenous  PONV Risk Score and Plan: 3 and Dexamethasone and Ondansetron  Airway Management Planned: Natural Airway and Simple Face Mask  Additional Equipment:   Intra-op Plan:   Post-operative Plan:   Informed Consent: I have reviewed the patients History and Physical, chart, labs and discussed the procedure including the risks, benefits and alternatives for the proposed anesthesia with the patient or authorized representative who has indicated his/her understanding and acceptance.     Dental advisory given  Plan Discussed with: CRNA  Anesthesia Plan Comments:         Anesthesia Quick Evaluation

## 2021-05-13 NOTE — Op Note (Signed)
° °   °  [address]       [place] [address]             [phone]        05/13/2021  Patient:  Susan Holder Pre-Op Dx: Stage IV lung cancer   Recurrent left-sided pleural effusion Post-op Dx: Same Procedure: Ultrasound-guided insertion of a Pleurx catheter  Surgeon and Role:      * Arneisha Kincannon, Lucile Crater, MD - Primary  Anesthesia  general EBL: 5 ml Blood Administration: None Specimen: None  Drains: Pleurx catheter in the left pleural space Counts: correct   Indications: Susan Holder is well-known to our service.  She has a history of stage IV lung cancer and previously underwent a left robotic assisted pericardial window.  She subsequently has had recurrent left-sided pleural effusions.  She is unable to tolerate a thoracentesis and thus has requested placement of a Pleurx catheter. Findings: The fluid collection was evident on ultrasound.  We identified an appropriate level and inserted our needle.  Serous fluid was withdrawn.  We then drained off there 100 mL of serous fluid after insertion of the Pleurx catheter.  Operative Technique: After the risks, benefits and alternatives were thoroughly discussed, the patient was brought to the operative theatre.  Anesthesia was induced, and the patient was placed in a lazy lateral decubitus position.  We used the ultrasound to identify the fluid collection.  Next we anesthetized intercostal level as well as the tract for the Pleurx catheter.  We then inserted a needle into the pleural space and confirmed its positioning by drawing back serous fluid.  Then using Seldinger technique we passed a wire into the pleural space.  The Pleurx catheter was then tunneled under the skin to our wire site.  The intercostal space was then dilated serially over the wire.  We then introduced a Pleurx catheter into the pleural space.  The skin and soft tissue were closed with absorbable suture    The patient tolerated the procedure  without any immediate complications, and was transferred to the PACU in stable condition.  Jaydin Jalomo Bary Leriche

## 2021-05-13 NOTE — Hospital Course (Addendum)
History of Present Illness:  Susan Holder is a 66 yo female with history of Stage IV lung cancer, malignant pericardial effusion s/p window creation, and recurrent left pleural effusions.  She also has recurrent right sided pleural effusion but has been treated with radiation to her right chest.  It is felt this will likely not resolve with drainage.  She has been followed by Dr. Kipp Brood who initially recommended Thoracentesis.  The patient attempted this but was unable to complete the procedure due to pain.  The patient presented to Dr. Abran Duke office on 05/08/2021 at which time the patient continued to complain of dyspnea.  She was requiring supplemental oxygen.  She is overall frail and deconditioned and presented to our office in a wheelchair.  She was offered a left sided pleur-x catheter for relief of her recurrent left pleural effusions.  The risks and benefits of the procedure were explained to the patient and she was agreeable to proceed.  Hospital Course:  Susan Holder presented to [facility] on 05/13/2021.  She was taken to the operating room and underwent placement of a left sided pleurx catheter.  She was admitted for overnight observation and teaching on care and drainage of her catheter.  The patient did well overnight.  She denied pain and states she wasn't coughing.  She is apprehensive of pain associated with drainage at home.  She has received bedside teaching of draining her pleurx catheter and home health has been arranged.  She is medically stable for discharge home today.

## 2021-05-13 NOTE — Anesthesia Postprocedure Evaluation (Signed)
Anesthesia Post Note  Patient: Susan Holder  Procedure(s) Performed: INSERTION PLEURAL DRAINAGE CATHETER (Left)     Patient location during evaluation: PACU Anesthesia Type: MAC Level of consciousness: awake and alert Pain management: pain level controlled Vital Signs Assessment: post-procedure vital signs reviewed and stable Respiratory status: spontaneous breathing, nonlabored ventilation, respiratory function stable and patient connected to nasal cannula oxygen Cardiovascular status: stable and blood pressure returned to baseline Postop Assessment: no apparent nausea or vomiting Anesthetic complications: no   No notable events documented.  Last Vitals:  Vitals:   05/13/21 1330 05/13/21 1400  BP: (!) 127/56 (!) 129/57  Pulse: 94 95  Resp: 17 18  Temp:    SpO2: 100% 100%    Last Pain:  Vitals:   05/13/21 1400  PainSc: 0-No pain                 Catalina Gravel

## 2021-05-13 NOTE — Transfer of Care (Signed)
Immediate Anesthesia Transfer of Care Note  Patient: Susan Holder  Procedure(s) Performed: INSERTION PLEURAL DRAINAGE CATHETER (Left)  Patient Location: PACU  Anesthesia Type:MAC  Level of Consciousness: drowsy and patient cooperative  Airway & Oxygen Therapy: Patient Spontanous Breathing and Patient connected to face mask oxygen  Post-op Assessment: Report given to RN and Post -op Vital signs reviewed and stable  Post vital signs: Reviewed and stable  Last Vitals:  Vitals Value Taken Time  BP 104/59 05/13/21 1227  Temp    Pulse 96 05/13/21 1229  Resp 18 05/13/21 1229  SpO2 100 % 05/13/21 1229  Vitals shown include unvalidated device data.  Last Pain:  Vitals:   05/13/21 1015  PainSc: 0-No pain         Complications: No notable events documented.

## 2021-05-13 NOTE — Telephone Encounter (Signed)
Sch per 1/4 los, left msg

## 2021-05-13 NOTE — Interval H&P Note (Signed)
History and Physical Interval Note:  05/13/2021 10:25 AM  Susan Holder  has presented today for surgery, with the diagnosis of LEFT PLEURAL EFFUSION.  The various methods of treatment have been discussed with the patient and family. After consideration of risks, benefits and other options for treatment, the patient has consented to  Procedure(s): INSERTION PLEURAL DRAINAGE CATHETER (Left) as a surgical intervention.  The patient's history has been reviewed, patient examined, no change in status, stable for surgery.  I have reviewed the patient's chart and labs.  Questions were answered to the patient's satisfaction.     Magdiel Bartles Bary Leriche

## 2021-05-13 NOTE — Discharge Summary (Signed)
Physician Discharge Summary  Patient ID: Susan Holder MRN: [id number] DOB/AGE: May 05, 1955 66 y.o.  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   Acid reflux 09/21/2018   Hoarseness 03/16/2018   Encounter for antineoplastic immunotherapy 02/01/2018   Primary malignant neoplasm of bronchus of right lower lobe ([place]) 12/21/2017   Adenocarcinoma of right lung, stage 4 ([place]) 11/17/2017   Encounter for antineoplastic chemotherapy 11/17/2017   Goals of care, counseling/discussion 11/17/2017   Lung mass    Left hand pain 03/06/2017   Rheumatoid arthritis involving multiple sites with positive rheumatoid factor ([place]) 05/29/2016   ANA positive 05/29/2016   Vitamin D deficiency 05/29/2016   High risk medication use 05/29/2016   Trigger finger, left ring finger 05/29/2016   Trigger finger, right ring finger 05/29/2016   DDD (degenerative disc disease), cervical 05/29/2016   Smoker 05/29/2016   Other fatigue 05/29/2016   Allergic rhinitis 08/22/2013   Discharge Diagnoses:   Patient Active Problem List   Diagnosis Date Noted   S/P Left sided pigtail catheter placement 05/13/2021   Anemia 04/23/2021   Pleural effusion 04/12/2021   Malnutrition of moderate degree 02/09/2021   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   Aortic atherosclerosis (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/2018   Acid reflux 09/21/2018   Hoarseness 03/16/2018   Encounter for antineoplastic immunotherapy 02/01/2018   Primary malignant neoplasm of bronchus of right lower lobe ([place]) 12/21/2017   Adenocarcinoma of right lung, stage 4 ([place]) 11/17/2017   Encounter for antineoplastic chemotherapy 11/17/2017   Goals of care, counseling/discussion 11/17/2017   Lung mass    Left hand pain 03/06/2017   Rheumatoid arthritis involving multiple sites with positive rheumatoid factor ([place]) 05/29/2016   ANA positive 05/29/2016   Vitamin D deficiency 05/29/2016   High risk medication use 05/29/2016   Trigger finger, left ring finger 05/29/2016   Trigger finger, right ring finger 05/29/2016   DDD (degenerative disc disease), cervical 05/29/2016   Smoker 05/29/2016   Other fatigue 05/29/2016   Allergic rhinitis 08/22/2013   Discharged Condition: good  History of Present Illness:  Susan Holder is a 66 yo female with history of Stage IV lung cancer, malignant pericardial effusion s/p window creation, and recurrent left pleural effusions.  She also has recurrent right sided pleural effusion but has been treated with radiation to her right chest.  It is felt this will likely not resolve with drainage.  She has been followed by Dr. Kipp Brood who initially recommended Thoracentesis.  The patient attempted this but was unable to complete the procedure due to pain.  The patient presented to Dr. Abran Duke office on 05/08/2021 at which time the patient continued to complain of dyspnea.  She was requiring supplemental oxygen.  She is overall frail and deconditioned and presented to our office in  a wheelchair.  She was offered a left sided pleur-x catheter for relief of her recurrent left pleural effusions.  The risks and benefits of the procedure were explained to the patient  and she was agreeable to proceed.  Hospital Course:  Susan Holder presented to [facility] on 05/13/2021.  She was taken to the operating room and underwent placement of a left sided pleurx catheter.  She was admitted for overnight observation and teaching on care and drainage of her catheter.  The patient did well overnight.  She denied pain and states she wasn't coughing.  She is apprehensive of pain associated with drainage at home.  She has received bedside teaching of draining her pleurx catheter and home health has been arranged.  She is medically stable for discharge home today.  Consults: None  Significant Diagnostic Studies:   IMPRESSION: 1. Stable postradiation changes of the perihilar right lung. 2. New diffuse bilateral ground-glass opacities with mild left upper lobe consolidations, differential includes pulmonary edema versus infectious or inflammatory etiology. Recommend attention on follow-up per clinical protocol. 3. Small bilateral pleural effusions, slightly decreased in size on the right when compared to prior exam and increased in size on the left. 4. Small pericardial effusion, decreased in size when compared to prior exam. 5. No evidence of metastatic disease in the abdomen or pelvis. 6. Aortic Atherosclerosis (ICD10-I70.0) and Emphysema (ICD10-J43.9).    Electronically Signed   By: Yetta Glassman M.D.   On: 04/24/2021 14:21  Treatments: surgery:   Insertion of Left Sided Pleurx Catheter  Discharge Exam: Blood pressure (!) 111/59, pulse 97, temperature 97.9 F (36.6 C), temperature source Oral, resp. rate 19, height 5' (1.524 m), weight 45.4 kg, last menstrual period 04/28/2000, SpO2 96 %.  General appearance: alert, cooperative, and cachectic Heart: regular rate and rhythm Lungs: diminished breath sounds bilaterally Abdomen: soft, non-tender; bowel sounds normal; no masses,  no organomegaly Wound: clean and dry    Discharge disposition:  06-Home-Health Care Svc  Allergies as of 05/14/2021   No Known Allergies      Medication List     STOP taking these medications    methylPREDNISolone 4 MG Tbpk tablet Commonly known as: MEDROL DOSEPAK       TAKE these medications    acetaminophen 325 MG tablet Commonly known as: TYLENOL Take 2 tablets (650 mg total) by mouth every 6 (six) hours.   doxycycline 100 MG tablet Commonly known as: VIBRA-TABS Take 1 tablet (100 mg total) by mouth 2 (two) times daily.   folic acid 1 MG tablet Commonly known as: FOLVITE TAKE 1 TABLET(1 MG) BY MOUTH DAILY   hydroxychloroquine 200 MG tablet Commonly known as: PLAQUENIL TAKE 1 TABLET BY MOUTH TWICE DAILY MONDAY THROUGH FRIDAY ONLY.   omeprazole 20 MG capsule Commonly known as: PRILOSEC Take 20 mg by mouth daily.   prochlorperazine 10 MG tablet Commonly known as: COMPAZINE TAKE 1 TABLET(10 MG) BY MOUTH EVERY 6 HOURS AS NEEDED FOR NAUSEA OR VOMITING   traMADol 50 MG tablet Commonly known as: ULTRAM Take 1 tablet (50 mg total) by mouth every 6 (six) hours as needed (mild pain).   vitamin C 500 MG tablet Commonly known as: ASCORBIC ACID Take 500 mg by mouth daily.   VITAMIN D PO Take 1 tablet by mouth daily.        Follow-up Information     Lightfoot, Lucile Crater, MD Follow up in 2 week(s).   Specialty: Cardiothoracic Surgery Contact information: [address]  [address] [place] [address] [phone]         Care, [facility] Follow up.   Specialty: Home Health Services Why: Your home health has been set up with [facility] The office will call you with start of service information. If you have any questions please call the number listed above. Contact information: [address] [place] [address] [phone]                 Signed: Ellwood Handler, PA-C  05/14/2021, 3:50 PM

## 2021-05-14 ENCOUNTER — Observation Stay (HOSPITAL_COMMUNITY): Payer: Medicare PPO

## 2021-05-14 ENCOUNTER — Other Ambulatory Visit: Payer: Medicare PPO

## 2021-05-14 ENCOUNTER — Inpatient Hospital Stay: Payer: Medicare PPO

## 2021-05-14 ENCOUNTER — Inpatient Hospital Stay: Payer: Medicare PPO | Admitting: Internal Medicine

## 2021-05-14 ENCOUNTER — Encounter (HOSPITAL_COMMUNITY): Payer: Self-pay | Admitting: Thoracic Surgery (Cardiothoracic Vascular Surgery)

## 2021-05-14 DIAGNOSIS — J90 Pleural effusion, not elsewhere classified: Secondary | ICD-10-CM | POA: Diagnosis not present

## 2021-05-14 DIAGNOSIS — C3490 Malignant neoplasm of unspecified part of unspecified bronchus or lung: Secondary | ICD-10-CM | POA: Diagnosis not present

## 2021-05-14 DIAGNOSIS — R0602 Shortness of breath: Secondary | ICD-10-CM | POA: Diagnosis not present

## 2021-05-14 DIAGNOSIS — C7931 Secondary malignant neoplasm of brain: Secondary | ICD-10-CM | POA: Diagnosis not present

## 2021-05-14 DIAGNOSIS — Z20822 Contact with and (suspected) exposure to covid-19: Secondary | ICD-10-CM | POA: Diagnosis not present

## 2021-05-14 DIAGNOSIS — I2510 Atherosclerotic heart disease of native coronary artery without angina pectoris: Secondary | ICD-10-CM | POA: Diagnosis not present

## 2021-05-14 DIAGNOSIS — Z87891 Personal history of nicotine dependence: Secondary | ICD-10-CM | POA: Diagnosis not present

## 2021-05-14 LAB — CBC
HCT: 28.9 % — ABNORMAL LOW (ref 36.0–46.0)
Hemoglobin: 9.2 g/dL — ABNORMAL LOW (ref 12.0–15.0)
MCH: 34.2 pg — ABNORMAL HIGH (ref 26.0–34.0)
MCHC: 31.8 g/dL (ref 30.0–36.0)
MCV: 107.4 fL — ABNORMAL HIGH (ref 80.0–100.0)
Platelets: 293 10*3/uL (ref 150–400)
RBC: 2.69 MIL/uL — ABNORMAL LOW (ref 3.87–5.11)
RDW: 20.6 % — ABNORMAL HIGH (ref 11.5–15.5)
WBC: 9.8 10*3/uL (ref 4.0–10.5)
nRBC: 0 % (ref 0.0–0.2)

## 2021-05-14 MED ORDER — ACETAMINOPHEN 325 MG PO TABS: 650.0000 mg | ORAL_TABLET | Freq: Four times a day (QID) | ORAL | Status: DC

## 2021-05-14 MED ORDER — TRAMADOL HCL 50 MG PO TABS: 50.0000 mg | ORAL_TABLET | Freq: Four times a day (QID) | ORAL | 0 refills | Status: DC | PRN

## 2021-05-14 NOTE — TOC Transition Note (Signed)
Transition of Care [facility]) - CM/SW Discharge Note   Patient Details  Name: Susan Holder MRN: [id number] Date of Birth: 03-Sep-1955  Transition of Care [facility]) CM/SW Contact:  Angelita Ingles, RN Phone [phone]  05/14/2021, 11:24 AM   Clinical Narrative:    [place] referral has been accepted by [facility]. The information has been added to the avs. No other needs are noted at this time. TOC will sign off.    Final next level of care: [place] Barriers to Discharge: No Barriers Identified   Patient Goals and CMS Choice Patient states their goals for this hospitalization and ongoing recovery are:: Hope to go home today CMS Medicare.gov Compare Post Acute Care list provided to:: Patient Choice offered to / list presented to : Patient  Discharge Placement                       Discharge Plan and Services In-house Referral: NA Discharge Planning Services: CM Consult Post Acute Care Choice: Home Health          DME Arranged: N/A DME Agency: NA       HH Arranged: RN [place] Agency: [place] Date [facility] Agency Contacted: 05/14/21 Time [place]: [phone] Representative spoke with at [place]: [place] ([place]) Interventions     Readmission Risk Interventions No flowsheet data found.

## 2021-05-14 NOTE — Progress Notes (Signed)
° °   °  [address]       [place] [address]             [phone]       1 Day Post-Op Procedure(s) (LRB): INSERTION PLEURAL DRAINAGE CATHETER (Left)  Subjective:  Patient doing okay.  No pain, not coughing.  She does admit she is anxious about pain associated with draining at home  Objective: Vital signs in last 24 hours: Temp:  [97.3 F (36.3 C)-98.4 F (36.9 C)] 97.7 F (36.5 C) (01/17 0400) Pulse Rate:  [90-113] 98 (01/17 0400) Cardiac Rhythm: Normal sinus rhythm (01/17 0400) Resp:  [12-20] 13 (01/17 0400) BP: (104-141)/(56-86) 131/64 (01/17 0400) SpO2:  [95 %-100 %] 97 % (01/17 0400) Weight:  [45.4 kg] 45.4 kg (01/16 1004)  Intake/Output from previous day: 01/16 0701 - 01/17 0700 In: 600 [P.O.:200; I.V.:300; IV Piggyback:100] Out: 680 [Urine:325; Blood:5]  General appearance: alert, cooperative, and cachectic Heart: regular rate and rhythm Lungs: diminished breath sounds bilaterally Abdomen: soft, non-tender; bowel sounds normal; no masses,  no organomegaly Wound: clean and dry  Lab Results: Recent Labs    05/14/21 0047  WBC 9.8  HGB 9.2*  HCT 28.9*  PLT 293   BMET: No results for input(s): NA, K, CL, CO2, GLUCOSE, BUN, CREATININE, CALCIUM in the last 72 hours.  PT/INR: No results for input(s): LABPROT, INR in the last 72 hours. ABG    Component Value Date/Time   PHART 7.447 02/05/2021 1400   HCO3 27.0 04/24/2021 1143   O2SAT 99.8 04/24/2021 1143   CBG (last 3)  No results for input(s): GLUCAP in the last 72 hours.  Assessment/Plan: S/P Procedure(s) (LRB): INSERTION PLEURAL DRAINAGE CATHETER (Left)  Stage IV lung cancer with bilateral recurrent effusions... Right side will likely not resolve as she has been treated with radiation.  Left side had pleur-x catheter placed yesterday  Dispo: will teach patient Pleur-x drainage today, home health orders have been placed, for discharge today   LOS: 0 days    Ellwood Handler,  PA-C 05/14/2021

## 2021-05-14 NOTE — TOC Initial Note (Signed)
Transition of Care [facility]) - Initial/Assessment Note    Patient Details  Name: Susan Holder MRN: [id number] Date of Birth: [birth year]  [facility] CM/SW Contact:    Angelita Ingles, RN Phone Number:[phone]  05/14/2021, 9:55 AM  Clinical Narrative:                 [facility] consulted for patient with [facility] RN needs to assist with home drain. CM at bedside to offer patient choice. Patient reviewed medicare.gov list but does not have a preference for any particular agency. [place] referral has been called to [place] with [place]. Awaiting acceptance. Patient states that she really doesn't think she needs much help because she will be going home with cousin that retired from many years in the medical profession. Cm has advised the patient that [facility] RN would be able to assist with management. Patient is agreeable to [facility]. Patient states that she does have PCP but is unable to recall last name right now. Patients reports that her pharmacy is [facility] on [address]. Patient states that she has no problems affording medications or transportation to pharmacy and appointments. There are currenlty no other needs noted atr this time. TOC will follow for any other disposition needs.   Expected Discharge Plan: Home/Self Care Barriers to Discharge: No Barriers Identified   Patient Goals and CMS Choice Patient states their goals for this hospitalization and ongoing recovery are:: Hope to go home today CMS Medicare.gov Compare Post Acute Care list provided to:: Patient Choice offered to / list presented to : Patient  Expected Discharge Plan and Services Expected Discharge Plan: Home/Self Care In-house Referral: NA Discharge Planning Services: CM Consult Post Acute Care Choice: [place] arrangements for the past 2 months: Apartment Expected Discharge Date: 05/14/21               DME Arranged: N/A DME Agency: NA       HH Arranged: RN [place] Agency: [place] Date [place]: 05/14/21 Time [place]: [phone] Representative spoke with at [place]: Tommi Rumps  Prior Living Arrangements/Services Living arrangements for the past 2 months: Apartment Lives with:: Relatives (cousin) Patient language and need for interpreter reviewed:: Yes Do you feel safe going back to the place where you live?: Yes      Need for Family Participation in Patient Care: Yes (Comment) Care giver support system in place?: Yes (comment) Current home services:  (n/a) Criminal Activity/Legal Involvement Pertinent to Current Situation/Hospitalization: No - Comment as needed  Activities of Daily Living Home Assistive Devices/Equipment: Eyeglasses ADL Screening (condition at time of admission) Patient's cognitive ability adequate to safely complete daily activities?: Yes Is the patient deaf or have difficulty hearing?: No Does the patient have difficulty seeing, even when wearing glasses/contacts?: No Does the patient have difficulty concentrating, remembering, or making decisions?: No Patient able to express need for assistance with ADLs?: Yes Does the patient have difficulty dressing or bathing?: No Independently performs ADLs?: Yes (appropriate for developmental age) Does the patient have difficulty walking or climbing stairs?: Yes Weakness of Legs: Both Weakness of Arms/Hands: Both  Permission Sought/Granted   Permission granted to share information with : No              Emotional Assessment Appearance:: Appears stated age Attitude/Demeanor/Rapport: Gracious Affect (typically observed): Pleasant Orientation: : Oriented to Self, Oriented to Place, Oriented to  Time, Oriented to Situation Alcohol / Substance Use: Not Applicable Psych Involvement:  No (comment)  Admission diagnosis:  Pleural effusion [J90] Patient Active Problem List   Diagnosis Date Noted   S/P Left sided pigtail catheter placement 05/13/2021   Anemia 04/23/2021   Pleural effusion  04/12/2021   Malnutrition of moderate degree 02/09/2021   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   Aortic atherosclerosis ([place]) 10/23/2020   Coronary artery calcification seen on CT scan 10/23/2020   Pericardial effusion 10/23/2020   met lung ca 10/22/2020   Rheumatoid arthritis ([place]) 10/22/2020   Brain metastases ([place]) 03/29/2019   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   Acid reflux 09/21/2018   Hoarseness 03/16/2018   Encounter for antineoplastic immunotherapy 02/01/2018   Primary malignant neoplasm of bronchus of right lower lobe ([place]) 12/21/2017   Adenocarcinoma of right lung, stage 4 ([place]) 11/17/2017   Encounter for antineoplastic chemotherapy 11/17/2017   Goals of care, counseling/discussion 11/17/2017   Lung mass    Left hand pain 03/06/2017   Rheumatoid arthritis involving multiple sites with positive rheumatoid factor ([place]) 05/29/2016   ANA positive 05/29/2016   Vitamin D deficiency 05/29/2016   High risk medication use 05/29/2016   Trigger finger, left ring finger 05/29/2016   Trigger finger, right ring finger 05/29/2016   DDD (degenerative disc disease), cervical 05/29/2016   Smoker 05/29/2016   Other fatigue 05/29/2016   Allergic rhinitis 08/22/2013   PCP:  [name], DO (Inactive) Pharmacy:   [facility] [address] [place] [address] Phone: [phone] Fax: [phone]     Social Determinants of Health (SDOH) Interventions    Readmission Risk Interventions No flowsheet data found.

## 2021-05-14 NOTE — Discharge Instructions (Signed)
Please drain pleur-x catheter daily x 7 days... record output.... if drainage is less than 200 cc consecutively can decrease drainage to every other day after 7 days You may sponge bath, keep catheter site clean and dry Activity as tolerated 4. No driving while using [place]

## 2021-05-20 NOTE — Progress Notes (Signed)
[facility] OFFICE PROGRESS NOTE  [name], DO (Inactive) No address on file  DIAGNOSIS: Metastatic non-small cell lung cancer initially diagnosed as stage IIIA (T3, N1/N2, M0) non-small cell lung cancer, adenocarcinoma presented with large right lower lobe lung mass with extension to the right hilum and subcarinal area diagnosed in July 2019.  She has brain metastasis in October 2020.  Biomarker Findings Tumor Mutational Burden - TMB-Intermediate (6 Muts/Mb) Microsatellite status - MS-Stable Genomic Findings For a complete list of the genes assayed, please refer to the Appendix. NRAS Q61R ARAF amplification STK11 G56W KRAS G13D MYCN amplification MCL1 amplification NKX2-1 amplification - equivocal TP53 G245V 7 Disease relevant genes with no reportable alterations: EGFR, ALK, BRAF, MET, ERBB2, RET, ROS1   PRIOR THERAPY: 1) Course of concurrent chemoradiation with weekly carboplatin for AUC of 2 and paclitaxel 45 mg/M2.  Status post 7 cycles.  Last dose was giving 01/11/2018. 2) Consolidation treatment with immunotherapy with Imfinzi (Durvalumab) 10 mg/KG every 2 weeks.  First dose February 09, 2018.  Status post 19 cycles. 3) status post stereotactic body radiotherapy to the enlarging right supraclavicular lymphadenopathy under the care of Dr. Lisbeth Renshaw. 4) SRS to multiple brain metastasis under the care of Dr. Lisbeth Renshaw.  CURRENT THERAPY: Systemic chemotherapy with carboplatin for AUC of 5, Alimta 500 mg/M2 and Keytruda 200 mg IV every 3 weeks.  First dose 08/16/2019.  Status post 29 cycles.  Starting from cycle #5 the patient is on maintenance treatment with Alimta and Keytruda every 3 weeks. Alimta was reduced to 400 mg/m2 starting from cycle #21.  INTERVAL HISTORY: Susan Holder 66 y.o. female returns to the clinic today for a follow-up visit.  The patient was last seen in the clinic on 05/01/21.  At that time, the patient was having progressive worsening symptoms  of fatigue, weakness, and weight loss. The patient had a restaging CT scan while in the ER a few days prior to that appointment which showed some inflammatory changes in her lung which could be pulmonary edema versus inflammation/infection.    The patient followed up with use on 05/01/21. She was not in any shape to receive treatment that day. Therefore, she was given a 3 week break from treatment. She underwent 2 week course of antibiotics as well as a medrol Dosepak for her inflammatory changes in the lung as well as decreased appetite.   In the interval since seen, the patient underwent a pleurex catheter placement under the care of Dr. Kipp Brood on 05/13/21. She is scheduled for follow up with Dr. Kipp Brood later this week. She reports she did not get any drainage yesterday from her pleurx.  Since last being seen, the patient gained 7 lbs back. She has been having a lot of ongoing issues with weight loss over the last several months so the weight gain is great for her. She noticed an immediate improvement in her appetite with the medrol dosepak. She is wondering if she can be on steroids consistently. She has been followed by the member of the nutritionist team. She is scheduled to see them next week; however, she no longer needs to come for infusion next week.  The patient drinks Ensure twice a day due to her weight loss.    She has been having ongoing issues with anemia. She denies any abnormal bleeding or bruising including epistaxis, gingival bleeding, hemoptysis, hematemesis, melena, or hematochezia.  She states it has been "years" since her last colonoscopy. No prior records available to me  for review. She denies any fever, chills, or night sweats.  She denies any sick contacts.  She denies any chest pain or hemoptysis.  She has a baseline chronic dry cough.  She denies any changes with her baseline dyspnea on exertion. She has been using her supplemental oxygen more as she has been getting winded.  Denies any nausea, vomiting, diarrhea, or constipation.  Denies any headache or visual changes.  Denies any rashes or skin changes.  She is here today for evaluation before considering starting cycle #30   MEDICAL HISTORY: Past Medical History:  Diagnosis Date   Acid reflux 09/21/2018   Adenocarcinoma of right lung, stage 4 ([place]) 11/17/2017   Allergic rhinitis 08/22/2013   ANA positive 05/29/2016   Aortic atherosclerosis ([place]) 10/23/2020   Brain metastases ([place]) 03/29/2019   COPD (chronic obstructive pulmonary disease) ([place])    Coronary artery calcification seen on CT scan 10/23/2020   DDD (degenerative disc disease), cervical 05/29/2016   Dyspnea    Encounter for antineoplastic chemotherapy 11/17/2017   Encounter for antineoplastic immunotherapy 02/01/2018   Goals of care, counseling/discussion 11/17/2017   High risk medication use 05/29/2016   04/29/2016: ==> plq 200 am & 100 qhs(adeq response).   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[place]) 11/04/2018   Other fatigue 05/29/2016   Pericardial effusion 10/23/2020   Primary malignant neoplasm of bronchus of right lower lobe ([place]) 12/21/2017   Rheumatoid arthritis ([place])    Rheumatoid arthritis involving multiple sites with positive rheumatoid factor ([place]) 05/29/2016   +RF +ANA +CCP    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   Smoker 05/29/2016   Trigger finger, left ring finger 05/29/2016   Trigger finger, right ring finger 05/29/2016   Vitamin D deficiency 05/29/2016    ALLERGIES:  has No Known Allergies.  MEDICATIONS:  Current Outpatient Medications  Medication Sig Dispense Refill   acetaminophen (TYLENOL) 325 MG tablet Take 2 tablets (650 mg total) by mouth every 6 (six) hours.     doxycycline (VIBRA-TABS) 100 MG tablet Take 1 tablet (100 mg total) by  mouth 2 (two) times daily. 28 tablet 0   folic acid (FOLVITE) 1 MG tablet TAKE 1 TABLET(1 MG) BY MOUTH DAILY 30 tablet 4   hydroxychloroquine (PLAQUENIL) 200 MG tablet TAKE 1 TABLET BY MOUTH TWICE DAILY MONDAY THROUGH FRIDAY ONLY. 120 tablet 0   omeprazole (PRILOSEC) 20 MG capsule Take 20 mg by mouth daily.     prochlorperazine (COMPAZINE) 10 MG tablet TAKE 1 TABLET(10 MG) BY MOUTH EVERY 6 HOURS AS NEEDED FOR NAUSEA OR VOMITING 30 tablet 2   traMADol (ULTRAM) 50 MG tablet Take 1 tablet (50 mg total) by mouth every 6 (six) hours as needed (mild pain). 30 tablet 0   vitamin C (ASCORBIC ACID) 500 MG tablet Take 500 mg by mouth daily.     VITAMIN D PO Take 1 tablet by mouth daily.     No current facility-administered medications for this visit.    SURGICAL HISTORY:  Past Surgical History:  Procedure Laterality Date   BRONCHIAL NEEDLE ASPIRATION BIOPSY  11/09/2017   Procedure: BRONCHIAL NEEDLE ASPIRATION BIOPSIES;  Surgeon: Marshell Garfinkel, MD;  Location: [facility];  Service: Cardiopulmonary;;   CHEST TUBE INSERTION Left 05/13/2021   Procedure: INSERTION PLEURAL DRAINAGE CATHETER;  Surgeon: Melodie Bouillon  O, MD;  Location: [facility];  Service: Thoracic;  Laterality: Left;   ENDOBRONCHIAL ULTRASOUND Bilateral 11/09/2017   Procedure: ENDOBRONCHIAL ULTRASOUND;  Surgeon: Marshell Garfinkel, MD;  Location: [facility];  Service: Cardiopulmonary;  Laterality: Bilateral;   NASAL SINUS SURGERY     NASAL SINUS SURGERY     THORACENTESIS Right 02/07/2021   Procedure: THORACENTESIS;  Surgeon: Lajuana Matte, MD;  Location: [facility];  Service: Thoracic;  Laterality: Right;   TUBAL LIGATION     VIDEO BRONCHOSCOPY  11/09/2017   Procedure: VIDEO BRONCHOSCOPY;  Surgeon: Marshell Garfinkel, MD;  Location: [facility];  Service: Cardiopulmonary;;   XI ROBOTIC ASSISTED PERICARDIAL WINDOW Left 02/07/2021   Procedure: XI ROBOTIC ASSISTED THORACOSCOPY PERICARDIAL WINDOW;  Surgeon: Lajuana Matte, MD;  Location:  [facility];  Service: Thoracic;  Laterality: Left;    REVIEW OF SYSTEMS:   Constitutional: Positive for fatigue and weakness. Negative for chills and fever.  HENT: Negative for mouth sores, nosebleeds, sore throat and trouble swallowing.   Eyes: Negative for eye problems and icterus.  Respiratory: Positive for shortness of breath.  Positive for chronic cough.  Negative for hemoptysis and wheezing.   Cardiovascular: Negative for chest pain and leg swelling.  Gastrointestinal: Negative for abdominal pain, constipation, diarrhea, nausea and vomiting.  Genitourinary: Negative for bladder incontinence, difficulty urinating, dysuria, frequency and hematuria.   Musculoskeletal: Negative for back pain, gait problem, neck pain and neck stiffness.  Skin: Negative for itching and rash.  Neurological: Negative for dizziness, extremity weakness, gait problem, headaches, light-headedness and seizures.  Hematological: Negative for adenopathy. Does not bruise/bleed easily.  Psychiatric/Behavioral: Negative for confusion, depression and sleep disturbance. The patient is not nervous/anxious.     PHYSICAL EXAMINATION:  Blood pressure 126/71, pulse (!) 101, temperature 97.9 F (36.6 C), temperature source Tympanic, resp. rate 18, height 5' (1.524 m), weight 101 lb 12.8 oz (46.2 kg), last menstrual period 04/28/2000, SpO2 98 %.  ECOG PERFORMANCE STATUS: 2-3  Physical Exam  Constitutional: Oriented to person, place, and time and cachectic appearing female and in no distress.  HENT:  Head: Normocephalic and atraumatic.  Mouth/Throat: Oropharynx is clear and moist. No oropharyngeal exudate.  Eyes: Conjunctivae are normal. Right eye exhibits no discharge. Left eye exhibits no discharge. No scleral icterus.  Neck: Normal range of motion. Neck supple.  Cardiovascular: Normal rate, regular rhythm, normal heart sounds and intact distal pulses.   Pulmonary/Chest: Effort normal and breath sounds normal. No respiratory  distress.  On supplemental oxygen. Abdominal: Soft. Bowel sounds are normal. Exhibits no distension and no mass. There is no tenderness.  Musculoskeletal: Normal range of motion. Exhibits no edema.  Lymphadenopathy:    No cervical adenopathy.  Neurological: Alert and oriented to person, place, and time. Exhibits muscle wasting.  The patient is examined in the wheelchair.   Skin: Skin is warm and dry. No rash noted. Not diaphoretic. No erythema. No pallor.  Psychiatric: Mood, memory and judgment normal.  Vitals reviewed.  LABORATORY DATA: Lab Results  Component Value Date   WBC 7.5 05/22/2021   HGB 8.1 (L) 05/22/2021   HCT 26.1 (L) 05/22/2021   MCV 108.3 (H) 05/22/2021   PLT 322 05/22/2021      Chemistry      Component Value Date/Time   NA 136 05/22/2021 1026   K 4.1 05/22/2021 1026   CL 100 05/22/2021 1026   CO2 30 05/22/2021 1026   BUN 14 05/22/2021 1026   CREATININE 1.10 (H) 05/22/2021 1026   CREATININE  0.80 07/24/2017 1438      Component Value Date/Time   CALCIUM 10.5 (H) 05/22/2021 1026   ALKPHOS 61 05/22/2021 1026   AST 20 05/22/2021 1026   ALT 6 05/22/2021 1026   BILITOT 0.3 05/22/2021 1026       RADIOGRAPHIC STUDIES:  DG Chest 2 View  Result Date: 05/10/2021 CLINICAL DATA:  Preoperative evaluation. EXAM: CHEST - 2 VIEW COMPARISON:  April 24, 2021 FINDINGS: Low lung volumes are seen with chronic, diffusely increased interstitial lung markings. Mild scarring and/or atelectatic changes are seen within the mid to upper right lung and bilateral lung bases. Moderate size bilateral pleural effusions are present. No pneumothorax is identified. The heart size and mediastinal contours are within normal limits. The visualized skeletal structures are unremarkable. IMPRESSION: 1. Chronic, diffusely increased interstitial lung markings with mild mid to upper right lung and bibasilar scarring and/or atelectatic changes. 2. Moderate size bilateral pleural effusions.  Electronically Signed   By: Virgina Norfolk M.D.   On: 05/10/2021 22:29   CT CHEST ABDOMEN PELVIS W CONTRAST  Result Date: 04/24/2021 CLINICAL DATA:  Small cell lung cancer, assess treatment response EXAM: CT CHEST, ABDOMEN, AND PELVIS WITH CONTRAST TECHNIQUE: Multidetector CT imaging of the chest, abdomen and pelvis was performed following the standard protocol during bolus administration of intravenous contrast. CONTRAST:  84m OMNIPAQUE IOHEXOL 350 MG/ML SOLN COMPARISON:  CT chest, abdomen and pelvis dated September 12th 2022 FINDINGS: CT CHEST FINDINGS Cardiovascular: Normal heart size. Small pericardial effusion, decreased in size when compared with prior exam. Atherosclerotic disease of the thoracic aorta. LAD and RCA coronary artery calcifications. Mediastinum/Nodes: No pathologically enlarged lymph nodes seen in the chest. Patulous esophagus. Lungs/Pleura: Central airways are patent. Centrilobular emphysema. Right paramediastinal post radiation changes, unchanged when compared with prior exam. New diffuse bilateral ground-glass opacities with mild left upper lobe consolidation. Small bilateral pleural effusions, slightly decreased in size on the right when compared to prior exam and increased in size on the left. Musculoskeletal: No chest wall mass or suspicious bone lesions identified. CT ABDOMEN PELVIS FINDINGS Hepatobiliary: No focal liver abnormality is seen. Mild periportal edema. No gallstones, gallbladder wall thickening, or biliary dilatation. Pancreas: Unremarkable. No pancreatic ductal dilatation or surrounding inflammatory changes. Spleen: Normal in size without focal abnormality. Adrenals/Urinary Tract: Adrenal glands are unremarkable. Kidneys are normal, without renal calculi, focal lesion, or hydronephrosis. Bladder is unremarkable. Stomach/Bowel: Stomach is within normal limits. Appendix appears normal. No evidence of bowel wall thickening, distention, or inflammatory changes.  Vascular/Lymphatic: Aortic atherosclerosis. No enlarged abdominal or pelvic lymph nodes. Reproductive: Status post hysterectomy. No adnexal masses. Other: No abdominal wall hernia or abnormality. No abdominopelvic ascites. Musculoskeletal: No acute or significant osseous findings. IMPRESSION: 1. Stable postradiation changes of the perihilar right lung. 2. New diffuse bilateral ground-glass opacities with mild left upper lobe consolidations, differential includes pulmonary edema versus infectious or inflammatory etiology. Recommend attention on follow-up per clinical protocol. 3. Small bilateral pleural effusions, slightly decreased in size on the right when compared to prior exam and increased in size on the left. 4. Small pericardial effusion, decreased in size when compared to prior exam. 5. No evidence of metastatic disease in the abdomen or pelvis. 6. Aortic Atherosclerosis (ICD10-I70.0) and Emphysema (ICD10-J43.9). Electronically Signed   By: LYetta GlassmanM.D.   On: 04/24/2021 14:21   DG Chest Port 1 View  Result Date: 05/14/2021 CLINICAL DATA:  66year old female with pleural effusion and shortness of breath. Small cell lung cancer. EXAM: PORTABLE CHEST 1 VIEW  COMPARISON:  Portable chest 05/13/2021 and earlier. FINDINGS: Portable AP upright view at 0638 hours. Stable left chest tube. No pneumothorax. Mildly improved lung volumes. Stable cardiac size and mediastinal contours. Architectural distortion and pleural effusion in the right lung appears stable. Stable left lung ventilation with widespread fine reticulonodular opacity. Stable visualized osseous structures. Negative visible bowel gas. IMPRESSION: Stable left chest tube and ventilation with no pneumothorax. Abnormal left lung reticulonodular opacity and right lung pleural effusion with architectural distortion. Electronically Signed   By: Genevie Ann M.D.   On: 05/14/2021 07:11   DG Chest Port 1 View  Result Date: 05/13/2021 CLINICAL DATA:   Pleural effusion.  Postop. EXAM: PORTABLE CHEST 1 VIEW COMPARISON:  05/10/2021 FINDINGS: Patient has small bore LEFT-sided pleural catheter. LEFT-sided pleural effusion is decreased. Small RIGHT pleural effusion persists, associated with subsegmental atelectasis in the perihilar region. There is mild prominence of interstitial markings throughout the lungs. Heart size is normal. No pneumothorax. IMPRESSION: Interval placement of LEFT-sided pleural catheter. Smaller LEFT pleural effusion. No pneumothorax. Electronically Signed   By: Nolon Nations M.D.   On: 05/13/2021 14:06   DG Chest Port 1 View  Result Date: 04/24/2021 CLINICAL DATA:  Hypoxia. Increased shortness of breath. History of left lung cancer. EXAM: PORTABLE CHEST 1 VIEW COMPARISON:  04/03/2021 FINDINGS: Normal sized heart. Moderate-sized bilateral pleural effusions without gross significant change. This is difficult to assess due to a possible decreased depth of inspiration. Stable mild prominence of the interstitial markings and decreased small amount of atelectasis or scarring in the right upper lung zone. IMPRESSION: 1. Moderate-sized bilateral pleural effusions without gross change, difficult to assess due to different techniques and possible decreased inspiration. 2. Decreased right upper lobe atelectasis and possible scarring. Electronically Signed   By: Claudie Revering M.D.   On: 04/24/2021 11:14     ASSESSMENT/PLAN:  This is a very pleasant 66 year old African American female with metastatic non-small cell lung cancer, adenocarcinoma initially diagnosed as IIIA in July 2019. She had evidence of metastatic disease with brain metastases in October 2020. She has no actionable mutations.    She is status post 7 cycles of concurrent chemoradiation with carboplatin for an AUC of 2 and paclitaxel 45 mg/m2. She had a partial response.   She then underwent consolidation immunotherapy with imfinzi. She is status post 18 cycles.   She  completed SBRT to the right supraclavicular lymph node.   She had evidence of multiple brain metastases in October 2020. She underwent SRS treatment to these lesions under the care of Dr. Lisbeth Renshaw.   She currently is undergoing systemic chemotherapy with carboplatin for an AUC of 5, Alimta 500 mg/m2 and Keytruda 200 mg IV every 3 weeks. She is status post 29 cycles. Starting from cycle #5 she started maintenance Alimta and Keytruda. Starting from cycle #21, Dr. Julien Nordmann reduced her alimta to 400 mg/m2 due to fatigue.   The patient was seen with Dr. Julien Nordmann today. Labs were reviewed.  Despite being off treatment for several weeks, the patient continues to have worsening anemia with a hemoglobin of 8.1.  Dr. Julien Nordmann recommends holding her Alimta for now due to the persistent and worsening cytopenias despite not having chemotherapy in several weeks.  She will proceed with single agent immunotherapy with Keytruda today as we do not expect Keytruda to cause myelosuppression.  In the meantime, we will send the patient home with occult stool cards to rule out GI blood loss.  If positive, we will need  to refer the patient back to gastroenterology for consideration of further evaluation for possible GI blood loss.    Recommend that she proceed with cycle 30 today with single agent immunotherapy with Keytruda.   The patient gained 7 pounds.  Discussed with the patient that she cannot be on steroids long-term due to it causing her immunotherapy to be less effective.  Discussed that we can periodically consider giving her a Medrol Dosepak in the future.  In the meantime, she will continue to follow with the nutritionist and continue to drink supplemental drinks.  She is scheduled to see them next week although she is not scheduled for infusion.  I will reach out to them to see if they need to reschedule the follow-up appointment for a day she is in the clinic.   We will see her back for follow-up visit in 3 weeks for  evaluation and repeat blood work before starting cycle 31.    The patient was advised to call immediately if she has any concerning symptoms in the interval. The patient voices understanding of current disease status and treatment options and is in agreement with the current care plan. All questions were answered. The patient knows to call the clinic with any problems, questions or concerns. We can certainly see the patient much sooner if necessary   Orders Placed This Encounter  Procedures   Occult blood card to lab, stool    Standing Status:   Future    Standing Expiration Date:   05/22/2022   Occult blood card to lab, stool    Standing Status:   Future    Standing Expiration Date:   05/22/2022   Occult blood card to lab, stool    Standing Status:   Future    Standing Expiration Date:   05/22/2022       Tobe Sos Heilingoetter, PA-C 05/22/21  ADDENDUM: Hematology/Oncology Attending: I had a face-to-face encounter with the patient today.  I reviewed her records, lab and recommended her care plan.  She is a very pleasant 66 years old African-American female who was initially diagnosed with a stage IIIa non-small cell lung cancer in July 2019 but has evidence for disease metastasis in October 2020.  She has no actionable mutations.  The patient was initially treated with a course of concurrent chemoradiation followed by consolidation treatment with immunotherapy for 18 cycles before it was discontinued after she developed disease progression with brain metastasis. She started induction systemic chemotherapy with carboplatin, Alimta and Keytruda for 4 cycles followed by maintenance Alimta and Keytruda every 3 weeks for additional 25 cycles.  She has been tolerating her treatment well until recently when she started having more fatigue and weakness as well as persistent anemia.  She also has pleural and pericardial effusion that currently being managed with Dr. Kipp Brood. Her treatment was  delayed by few weeks because of her significant decline in her condition.  She was treated with Medrol Dosepak and she felt much better.  She continues to have persistent anemia of unclear etiology that could be chemotherapy-induced but GI blood loss could not be completely excluded. I recommended for the patient to resume her treatment but we will treat her with only single agent Keytruda at this point because of the significant weakness and fatigue as well as anemia with the Alimta. Will give the patient a stool cards to check for Hemoccult and if positive will refer her to gastroenterology for evaluation. She will come back for follow-up visit in 3  weeks for evaluation before the next cycle of her treatment. She was advised to call immediately if she has any other concerning symptoms in the interval.  The total time spent in the appointment was 30 minutes. Disclaimer: This note was dictated with voice recognition software. Similar sounding words can inadvertently be transcribed and may be missed upon review. Eilleen Kempf, MD 05/22/21

## 2021-05-22 ENCOUNTER — Inpatient Hospital Stay: Payer: Medicare PPO

## 2021-05-22 ENCOUNTER — Inpatient Hospital Stay: Payer: Medicare PPO | Admitting: Physician Assistant

## 2021-05-22 ENCOUNTER — Other Ambulatory Visit: Payer: Self-pay

## 2021-05-22 VITALS — BP 126/71 | HR 101 | Temp 97.9°F | Resp 18 | Ht 60.0 in | Wt 101.8 lb

## 2021-05-22 DIAGNOSIS — C3491 Malignant neoplasm of unspecified part of right bronchus or lung: Secondary | ICD-10-CM

## 2021-05-22 DIAGNOSIS — Z85841 Personal history of malignant neoplasm of brain: Secondary | ICD-10-CM | POA: Diagnosis not present

## 2021-05-22 DIAGNOSIS — D649 Anemia, unspecified: Secondary | ICD-10-CM | POA: Diagnosis not present

## 2021-05-22 DIAGNOSIS — J910 Malignant pleural effusion: Secondary | ICD-10-CM | POA: Diagnosis not present

## 2021-05-22 DIAGNOSIS — C3431 Malignant neoplasm of lower lobe, right bronchus or lung: Secondary | ICD-10-CM | POA: Diagnosis not present

## 2021-05-22 DIAGNOSIS — Z5112 Encounter for antineoplastic immunotherapy: Secondary | ICD-10-CM

## 2021-05-22 DIAGNOSIS — R64 Cachexia: Secondary | ICD-10-CM | POA: Diagnosis not present

## 2021-05-22 DIAGNOSIS — Z79899 Other long term (current) drug therapy: Secondary | ICD-10-CM | POA: Diagnosis not present

## 2021-05-22 DIAGNOSIS — R5383 Other fatigue: Secondary | ICD-10-CM | POA: Diagnosis not present

## 2021-05-22 DIAGNOSIS — C771 Secondary and unspecified malignant neoplasm of intrathoracic lymph nodes: Secondary | ICD-10-CM | POA: Diagnosis not present

## 2021-05-22 LAB — TSH: TSH: 2.74 u[IU]/mL (ref 0.308–3.960)

## 2021-05-22 LAB — CMP (CANCER CENTER ONLY)
ALT: 6 U/L (ref 0–44)
AST: 20 U/L (ref 15–41)
Albumin: 3 g/dL — ABNORMAL LOW (ref 3.5–5.0)
Alkaline Phosphatase: 61 U/L (ref 38–126)
Anion gap: 6 (ref 5–15)
BUN: 14 mg/dL (ref 8–23)
CO2: 30 mmol/L (ref 22–32)
Calcium: 10.5 mg/dL — ABNORMAL HIGH (ref 8.9–10.3)
Chloride: 100 mmol/L (ref 98–111)
Creatinine: 1.1 mg/dL — ABNORMAL HIGH (ref 0.44–1.00)
GFR, Estimated: 55 mL/min — ABNORMAL LOW (ref >60)
Glucose, Bld: 100 mg/dL — ABNORMAL HIGH (ref 70–99)
Potassium: 4.1 mmol/L (ref 3.5–5.1)
Sodium: 136 mmol/L (ref 135–145)
Total Bilirubin: 0.3 mg/dL (ref 0.3–1.2)
Total Protein: 6.7 g/dL (ref 6.5–8.1)

## 2021-05-22 LAB — SAMPLE TO BLOOD BANK

## 2021-05-22 LAB — CBC WITH DIFFERENTIAL (CANCER CENTER ONLY)
Abs Immature Granulocytes: 0.02 10*3/uL (ref 0.00–0.07)
Basophils Absolute: 0 10*3/uL (ref 0.0–0.1)
Basophils Relative: 0 %
Eosinophils Absolute: 0.1 10*3/uL (ref 0.0–0.5)
Eosinophils Relative: 1 %
HCT: 26.1 % — ABNORMAL LOW (ref 36.0–46.0)
Hemoglobin: 8.1 g/dL — ABNORMAL LOW (ref 12.0–15.0)
Immature Granulocytes: 0 %
Lymphocytes Relative: 10 %
Lymphs Abs: 0.8 10*3/uL (ref 0.7–4.0)
MCH: 33.6 pg (ref 26.0–34.0)
MCHC: 31 g/dL (ref 30.0–36.0)
MCV: 108.3 fL — ABNORMAL HIGH (ref 80.0–100.0)
Monocytes Absolute: 0.9 10*3/uL (ref 0.1–1.0)
Monocytes Relative: 12 %
Neutro Abs: 5.8 10*3/uL (ref 1.7–7.7)
Neutrophils Relative %: 77 %
Platelet Count: 322 10*3/uL (ref 150–400)
RBC: 2.41 MIL/uL — ABNORMAL LOW (ref 3.87–5.11)
RDW: 18.9 % — ABNORMAL HIGH (ref 11.5–15.5)
WBC Count: 7.5 10*3/uL (ref 4.0–10.5)
nRBC: 0 % (ref 0.0–0.2)

## 2021-05-22 MED ORDER — SODIUM CHLORIDE 0.9 % IV SOLN: Freq: Once | INTRAVENOUS | Status: AC

## 2021-05-22 MED ORDER — SODIUM CHLORIDE 0.9 % IV SOLN
200.0000 mg | Freq: Once | INTRAVENOUS | Status: AC
  Administered 2021-05-22: 13:00:00 200 mg via INTRAVENOUS
  Filled 2021-05-22: qty 200

## 2021-05-22 NOTE — Patient Instructions (Signed)
[facility] ONCOLOGY  Discharge Instructions: Thank you for choosing [facility] to provide your oncology and hematology care.   If you have a lab appointment with the [facility], please go directly to the [facility] and check in at the registration area.   Wear comfortable clothing and clothing appropriate for easy access to any Portacath or PICC line.   We strive to give you quality time with your provider. You may need to reschedule your appointment if you arrive late (15 or more minutes).  Arriving late affects you and other patients whose appointments are after yours.  Also, if you miss three or more appointments without notifying the office, you may be dismissed from the clinic at the providers discretion.      For prescription refill requests, have your pharmacy contact our office and allow 72 hours for refills to be completed.    Today you received the following chemotherapy and/or immunotherapy agent: Keytruda   To help prevent nausea and vomiting after your treatment, we encourage you to take your nausea medication as directed.  BELOW ARE SYMPTOMS THAT SHOULD BE REPORTED IMMEDIATELY: *FEVER GREATER THAN 100.4 F (38 C) OR HIGHER *CHILLS OR SWEATING *NAUSEA AND VOMITING THAT IS NOT CONTROLLED WITH YOUR NAUSEA MEDICATION *UNUSUAL SHORTNESS OF BREATH *UNUSUAL BRUISING OR BLEEDING *URINARY PROBLEMS (pain or burning when urinating, or frequent urination) *BOWEL PROBLEMS (unusual diarrhea, constipation, pain near the anus) TENDERNESS IN MOUTH AND THROAT WITH OR WITHOUT PRESENCE OF ULCERS (sore throat, sores in mouth, or a toothache) UNUSUAL RASH, SWELLING OR PAIN  UNUSUAL VAGINAL DISCHARGE OR ITCHING   Items with * indicate a potential emergency and should be followed up as soon as possible or go to the Emergency Department if any problems should occur.  Please show the CHEMOTHERAPY ALERT CARD or IMMUNOTHERAPY ALERT CARD at check-in to the  Emergency Department and triage nurse.  Should you have questions after your visit or need to cancel or reschedule your appointment, please contact [facility]  Dept: [phone]  and follow the prompts.  Office hours are 8:00 a.m. to 4:30 p.m. Monday - Friday. Please note that voicemails left after 4:00 p.m. may not be returned until the following business day.  We are closed weekends and major holidays. You have access to a nurse at all times for urgent questions. Please call the main number to the clinic Dept: [phone] and follow the prompts.   For any non-urgent questions, you may also contact your provider using MyChart. We now offer e-Visits for anyone 68 and older to request care online for non-urgent symptoms. For details visit [email or web address].   Also download the MyChart app! Go to the app store, search "MyChart", open the app, select [facility], and log in with your MyChart username and password.  Due to Covid, a mask is required upon entering the hospital/clinic. If you do not have a mask, one will be given to you upon arrival. For doctor visits, patients may have 1 support person aged 3 or older with them. For treatment visits, patients cannot have anyone with them due to current Covid guidelines and our immunocompromised population.

## 2021-05-22 NOTE — Progress Notes (Signed)
Ok to treat with vital signs per Cassie, PA note -- "Recommend that she proceed with cycle 30 today"

## 2021-05-24 ENCOUNTER — Other Ambulatory Visit: Payer: Self-pay

## 2021-05-24 ENCOUNTER — Ambulatory Visit (INDEPENDENT_AMBULATORY_CARE_PROVIDER_SITE_OTHER): Payer: Medicare PPO | Admitting: Thoracic Surgery (Cardiothoracic Vascular Surgery)

## 2021-05-24 VITALS — BP 129/72 | HR 111 | Resp 20 | Ht 60.0 in

## 2021-05-24 DIAGNOSIS — Z938 Other artificial opening status: Secondary | ICD-10-CM

## 2021-05-24 NOTE — Progress Notes (Signed)
° °   °  [address]       [place] [address]             [phone]        Susan Holder [facility] Medical Record #[id number] Date of Birth: 1955-07-10  Referring: Curt Bears, MD Primary Care: [name], DO (Inactive) Primary Cardiologist:None  Reason for visit:   follow-up  History of Present Illness:     [name] comes in for 1 week follow-up appointment after Pleurx catheter placement.  She has not been draining much.  The most that she is drained since surgery is 125 mL.  All other drainages have been between 25 and 50 mL.  She does not notice much of a difference in regards to her respiratory status.  Physical Exam: BP 129/72 (BP Location: Left Arm, Patient Position: Sitting)    Pulse (!) 111    Resp 20    Ht 5' (1.524 m)    LMP 04/28/2000    SpO2 100% Comment: 2L [place]   BMI 19.88 kg/m   Alert NAD Dressing in place Abdomen soft, ND No peripheral edema       Assessment / Plan:   66 year old female with history of stage IV lung cancer, status post left thoracoscopy with pericardial window, now with recurrent left-sided pleural effusion recently underwent a Pleurx catheter placement.  I instructed her to the drain twice a week.  I will meet with her virtually in 2 weeks to see if we can transition to 1 drainage per week.   Lajuana Matte 05/24/2021 2:28 PM

## 2021-05-30 DIAGNOSIS — J90 Pleural effusion, not elsewhere classified: Secondary | ICD-10-CM | POA: Diagnosis not present

## 2021-05-30 DIAGNOSIS — C7800 Secondary malignant neoplasm of unspecified lung: Secondary | ICD-10-CM | POA: Diagnosis not present

## 2021-06-04 ENCOUNTER — Other Ambulatory Visit: Payer: Medicare PPO

## 2021-06-04 ENCOUNTER — Ambulatory Visit: Payer: Medicare PPO

## 2021-06-04 ENCOUNTER — Ambulatory Visit: Payer: Medicare PPO | Admitting: Physician Assistant

## 2021-06-04 ENCOUNTER — Inpatient Hospital Stay: Payer: Medicare PPO | Admitting: Nutrition

## 2021-06-04 DIAGNOSIS — C7931 Secondary malignant neoplasm of brain: Secondary | ICD-10-CM | POA: Diagnosis not present

## 2021-06-04 DIAGNOSIS — Z923 Personal history of irradiation: Secondary | ICD-10-CM | POA: Diagnosis not present

## 2021-06-04 DIAGNOSIS — Z5112 Encounter for antineoplastic immunotherapy: Secondary | ICD-10-CM | POA: Insufficient documentation

## 2021-06-04 DIAGNOSIS — J449 Chronic obstructive pulmonary disease, unspecified: Secondary | ICD-10-CM | POA: Diagnosis not present

## 2021-06-04 DIAGNOSIS — J910 Malignant pleural effusion: Secondary | ICD-10-CM | POA: Diagnosis not present

## 2021-06-04 DIAGNOSIS — R5383 Other fatigue: Secondary | ICD-10-CM | POA: Diagnosis not present

## 2021-06-04 DIAGNOSIS — R0609 Other forms of dyspnea: Secondary | ICD-10-CM | POA: Diagnosis not present

## 2021-06-04 DIAGNOSIS — D649 Anemia, unspecified: Secondary | ICD-10-CM | POA: Insufficient documentation

## 2021-06-04 DIAGNOSIS — M069 Rheumatoid arthritis, unspecified: Secondary | ICD-10-CM | POA: Diagnosis not present

## 2021-06-04 DIAGNOSIS — C3431 Malignant neoplasm of lower lobe, right bronchus or lung: Secondary | ICD-10-CM | POA: Diagnosis not present

## 2021-06-04 LAB — OCCULT BLOOD X 1 CARD TO LAB, STOOL
Fecal Occult Bld: NEGATIVE
Fecal Occult Bld: NEGATIVE
Fecal Occult Bld: NEGATIVE

## 2021-06-05 ENCOUNTER — Telehealth: Payer: Self-pay

## 2021-06-05 NOTE — Telephone Encounter (Signed)
Called the patient regarding her fecal occult cards; there was no blood detected in them. I informed the patient that she does not need to see GI per Cassie, and that we will continue to monitor her labs. Patient verbalized understanding and had no further questions or concerns.

## 2021-06-07 ENCOUNTER — Other Ambulatory Visit: Payer: Self-pay

## 2021-06-07 ENCOUNTER — Ambulatory Visit (INDEPENDENT_AMBULATORY_CARE_PROVIDER_SITE_OTHER): Payer: Self-pay | Admitting: Thoracic Surgery (Cardiothoracic Vascular Surgery)

## 2021-06-07 DIAGNOSIS — Z938 Other artificial opening status: Secondary | ICD-10-CM

## 2021-06-07 NOTE — Progress Notes (Signed)
°   °  [address]       [place] [address]             [phone]       Patient: Home Provider: Office Consent for Telemedicine visit obtained.  Todays visit was completed via a real-time telehealth (see specific modality noted below). The patient/authorized person provided oral consent at the time of the visit to engage in a telemedicine encounter with the present provider at [facility]. The patient/authorized person was informed of the potential benefits, limitations, and risks of telemedicine. The patient/authorized person expressed understanding that the laws that protect confidentiality also apply to telemedicine. The patient/authorized person acknowledged understanding that telemedicine does not provide emergency services and that he or she would need to call 911 or proceed to the nearest hospital for help if such a need arose.   Total time spent in the clinical discussion 10 minutes.  Telehealth Modality: Phone visit (audio only)  I had a telephone visit with Susan Holder.  She is a 66 year old female with history of stage IV right-sided lung cancer.  She also had chronic pericardial effusion and underwent a left thoracoscopy with pericardial window.  She recently underwent a Pleurx catheter placement on the left for persistent pericardial effusion.  She continues to drain about 200-250 every 3 to 4 days.  I told her to continue with the schedule reassess things in a week.

## 2021-06-12 ENCOUNTER — Inpatient Hospital Stay: Payer: Medicare PPO | Admitting: Dietician

## 2021-06-12 ENCOUNTER — Other Ambulatory Visit: Payer: Self-pay

## 2021-06-12 ENCOUNTER — Inpatient Hospital Stay: Payer: Medicare PPO | Admitting: Internal Medicine

## 2021-06-12 ENCOUNTER — Inpatient Hospital Stay: Payer: Medicare PPO | Attending: Internal Medicine

## 2021-06-12 ENCOUNTER — Other Ambulatory Visit: Payer: Self-pay | Admitting: Radiation Therapy

## 2021-06-12 ENCOUNTER — Inpatient Hospital Stay: Payer: Medicare PPO

## 2021-06-12 VITALS — BP 110/65 | HR 105 | Temp 96.5°F | Resp 18 | Ht 60.0 in | Wt 103.1 lb

## 2021-06-12 VITALS — HR 102 | Wt 104.0 lb

## 2021-06-12 DIAGNOSIS — Z5112 Encounter for antineoplastic immunotherapy: Secondary | ICD-10-CM

## 2021-06-12 DIAGNOSIS — C3491 Malignant neoplasm of unspecified part of right bronchus or lung: Secondary | ICD-10-CM

## 2021-06-12 DIAGNOSIS — J449 Chronic obstructive pulmonary disease, unspecified: Secondary | ICD-10-CM | POA: Diagnosis not present

## 2021-06-12 DIAGNOSIS — J910 Malignant pleural effusion: Secondary | ICD-10-CM | POA: Diagnosis not present

## 2021-06-12 DIAGNOSIS — R0609 Other forms of dyspnea: Secondary | ICD-10-CM | POA: Diagnosis not present

## 2021-06-12 DIAGNOSIS — C3431 Malignant neoplasm of lower lobe, right bronchus or lung: Secondary | ICD-10-CM

## 2021-06-12 DIAGNOSIS — R5383 Other fatigue: Secondary | ICD-10-CM | POA: Diagnosis not present

## 2021-06-12 DIAGNOSIS — D649 Anemia, unspecified: Secondary | ICD-10-CM | POA: Diagnosis not present

## 2021-06-12 DIAGNOSIS — C7931 Secondary malignant neoplasm of brain: Secondary | ICD-10-CM | POA: Diagnosis not present

## 2021-06-12 DIAGNOSIS — Z923 Personal history of irradiation: Secondary | ICD-10-CM | POA: Diagnosis not present

## 2021-06-12 LAB — CBC WITH DIFFERENTIAL (CANCER CENTER ONLY)
Abs Immature Granulocytes: 0.01 10*3/uL (ref 0.00–0.07)
Basophils Absolute: 0.1 10*3/uL (ref 0.0–0.1)
Basophils Relative: 1 %
Eosinophils Absolute: 0.1 10*3/uL (ref 0.0–0.5)
Eosinophils Relative: 1 %
HCT: 28.4 % — ABNORMAL LOW (ref 36.0–46.0)
Hemoglobin: 9 g/dL — ABNORMAL LOW (ref 12.0–15.0)
Immature Granulocytes: 0 %
Lymphocytes Relative: 11 %
Lymphs Abs: 1 10*3/uL (ref 0.7–4.0)
MCH: 33.7 pg (ref 26.0–34.0)
MCHC: 31.7 g/dL (ref 30.0–36.0)
MCV: 106.4 fL — ABNORMAL HIGH (ref 80.0–100.0)
Monocytes Absolute: 0.9 10*3/uL (ref 0.1–1.0)
Monocytes Relative: 11 %
Neutro Abs: 6.4 10*3/uL (ref 1.7–7.7)
Neutrophils Relative %: 76 %
Platelet Count: 359 10*3/uL (ref 150–400)
RBC: 2.67 MIL/uL — ABNORMAL LOW (ref 3.87–5.11)
RDW: 15.6 % — ABNORMAL HIGH (ref 11.5–15.5)
WBC Count: 8.4 10*3/uL (ref 4.0–10.5)
nRBC: 0 % (ref 0.0–0.2)

## 2021-06-12 LAB — CMP (CANCER CENTER ONLY)
ALT: 9 U/L (ref 0–44)
AST: 23 U/L (ref 15–41)
Albumin: 2.6 g/dL — ABNORMAL LOW (ref 3.5–5.0)
Alkaline Phosphatase: 64 U/L (ref 38–126)
Anion gap: 5 (ref 5–15)
BUN: 15 mg/dL (ref 8–23)
CO2: 29 mmol/L (ref 22–32)
Calcium: 10.7 mg/dL — ABNORMAL HIGH (ref 8.9–10.3)
Chloride: 102 mmol/L (ref 98–111)
Creatinine: 1.28 mg/dL — ABNORMAL HIGH (ref 0.44–1.00)
GFR, Estimated: 46 mL/min — ABNORMAL LOW (ref >60)
Glucose, Bld: 93 mg/dL (ref 70–99)
Potassium: 3.9 mmol/L (ref 3.5–5.1)
Sodium: 136 mmol/L (ref 135–145)
Total Bilirubin: <0.1 mg/dL — ABNORMAL LOW (ref 0.3–1.2)
Total Protein: 6.9 g/dL (ref 6.5–8.1)

## 2021-06-12 LAB — TSH: TSH: 2.68 u[IU]/mL (ref 0.308–3.960)

## 2021-06-12 MED ORDER — SODIUM CHLORIDE 0.9 % IV SOLN: Freq: Once | INTRAVENOUS | Status: AC

## 2021-06-12 MED ORDER — SODIUM CHLORIDE 0.9 % IV SOLN
200.0000 mg | Freq: Once | INTRAVENOUS | Status: AC
  Administered 2021-06-12: 200 mg via INTRAVENOUS
  Filled 2021-06-12: qty 200

## 2021-06-12 NOTE — Progress Notes (Signed)
Okay to treat with elevated Pulse per Dr. Julien Nordmann.

## 2021-06-12 NOTE — Patient Instructions (Signed)
[facility]  Discharge Instructions: ?Thank you for choosing [facility] to provide your oncology and hematology care.  ? ?If you have a lab appointment with the Cancer Center, please go directly to the Cancer Center and check in at the registration area. ?  ?Wear comfortable clothing and clothing appropriate for easy access to any Portacath or PICC line.  ? ?We strive to give you quality time with your provider. You may need to reschedule your appointment if you arrive late (15 or more minutes).  Arriving late affects you and other patients whose appointments are after yours.  Also, if you miss three or more appointments without notifying the office, you may be dismissed from the clinic at the provider?s discretion.    ?  ?For prescription refill requests, have your pharmacy contact our office and allow 72 hours for refills to be completed.   ? ?Today you received the following chemotherapy and/or immunotherapy agents: Keytruda ?  ?To help prevent nausea and vomiting after your treatment, we encourage you to take your nausea medication as directed. ? ?BELOW ARE SYMPTOMS THAT SHOULD BE REPORTED IMMEDIATELY: ?*FEVER GREATER THAN 100.4 F (38 ?C) OR HIGHER ?*CHILLS OR SWEATING ?*NAUSEA AND VOMITING THAT IS NOT CONTROLLED WITH YOUR NAUSEA MEDICATION ?*UNUSUAL SHORTNESS OF BREATH ?*UNUSUAL BRUISING OR BLEEDING ?*URINARY PROBLEMS (pain or burning when urinating, or frequent urination) ?*BOWEL PROBLEMS (unusual diarrhea, constipation, pain near the anus) ?TENDERNESS IN MOUTH AND THROAT WITH OR WITHOUT PRESENCE OF ULCERS (sore throat, sores in mouth, or a toothache) ?UNUSUAL RASH, SWELLING OR PAIN  ?UNUSUAL VAGINAL DISCHARGE OR ITCHING  ? ?Items with * indicate a potential emergency and should be followed up as soon as possible or go to the Emergency Department if any problems should occur. ? ?Please show the CHEMOTHERAPY ALERT CARD or IMMUNOTHERAPY ALERT CARD at check-in to the  Emergency Department and triage nurse. ? ?Should you have questions after your visit or need to cancel or reschedule your appointment, please contact [facility]  Dept: [phone]  and follow the prompts.  Office hours are 8:00 a.m. to 4:30 p.m. Monday - Friday. Please note that voicemails left after 4:00 p.m. may not be returned until the following business day.  We are closed weekends and major holidays. You have access to a nurse at all times for urgent questions. Please call the main number to the clinic Dept: [phone] and follow the prompts. ? ? ?For any non-urgent questions, you may also contact your provider using MyChart. We now offer e-Visits for anyone 18 and older to request care online for non-urgent symptoms. For details visit [email or web address]. ?  ?Also download the MyChart app! Go to the app store, search "MyChart", open the app, select [facility], and log in with your MyChart username and password. ? ?Due to Covid, a mask is required upon entering the hospital/clinic. If you do not have a mask, one will be given to you upon arrival. For doctor visits, patients may have 1 support person aged 18 or older with them. For treatment visits, patients cannot have anyone with them due to current Covid guidelines and our immunocompromised population.  ? ?

## 2021-06-12 NOTE — Progress Notes (Signed)
[facility] Telephone:([phone]   Fax:([phone]) [phone]  OFFICE PROGRESS NOTE  [name], DO (Inactive) No address on file  DIAGNOSIS: Metastatic non-small cell lung cancer initially diagnosed as stage IIIA (T3, N1/N2, M0) non-small cell lung cancer, adenocarcinoma presented with large right lower lobe lung mass with extension to the right hilum and subcarinal area diagnosed in July 2019.  She has brain metastasis in October 2020.   Biomarker Findings Tumor Mutational Burden - TMB-Intermediate (6 Muts/Mb) Microsatellite status - MS-Stable Genomic Findings For a complete list of the genes assayed, please refer to the Appendix. NRAS Q61R ARAF amplification STK11 G56W KRAS G13D MYCN amplification MCL1 amplification NKX2-1 amplification - equivocal TP53 G245V 7 Disease relevant genes with no reportable alterations: EGFR, ALK, BRAF, MET, ERBB2, RET, ROS1    PRIOR THERAPY: 1) Course of concurrent chemoradiation with weekly carboplatin for AUC of 2 and paclitaxel 45 mg/M2.  Status post 7 cycles.  Last dose was giving 01/11/2018. 2) Consolidation treatment with immunotherapy with Imfinzi (Durvalumab) 10 mg/KG every 2 weeks.  First dose February 09, 2018.  Status post 19 cycles. 3) status post stereotactic body radiotherapy to the enlarging right supraclavicular lymphadenopathy under the care of Dr. Lisbeth Renshaw. 4) SRS to multiple brain metastasis under the care of Dr. Lisbeth Renshaw.   CURRENT THERAPY: Systemic chemotherapy with carboplatin for AUC of 5, Alimta 500 mg/M2 and Keytruda 200 mg IV every 3 weeks.  First dose 08/16/2019.  Status post 30 cycles.  Starting from cycle #5 the patient is on maintenance treatment with Alimta and Keytruda every 3 weeks. Alimta was reduced to 400 mg/m2 starting from cycle #21.  Starting from cycle #30 her treatment was changed to single agent Keytruda every 3 weeks.  Alimta was discontinued secondary to toxicity.  INTERVAL HISTORY: Susan Holder 66 y.o. female returns to the clinic today for returns to the clinic today for follow-up visit.  The patient is feeling fine today with no concerning complaints except for the baseline fatigue and shortness of breath.  She is currently on home oxygen.  She denied having any chest pain, cough or hemoptysis.  She denied having any nausea, vomiting, diarrhea or constipation.  She has no headache or visual changes.  She denied having any recent weight loss or night sweats.  She tolerated the last cycle of her treatment with Keytruda fairly well.  The patient is here today for evaluation before starting cycle #30 one of her treatment.  MEDICAL HISTORY: Past Medical History:  Diagnosis Date   Acid reflux 09/21/2018   Adenocarcinoma of right lung, stage 4 ([facility]) 11/17/2017   Allergic rhinitis 08/22/2013   ANA positive 05/29/2016   Aortic atherosclerosis ([facility]) 10/23/2020   Brain metastases ([facility]) 03/29/2019   COPD (chronic obstructive pulmonary disease) ([facility])    Coronary artery calcification seen on CT scan 10/23/2020   DDD (degenerative disc disease), cervical 05/29/2016   Dyspnea    Encounter for antineoplastic chemotherapy 11/17/2017   Encounter for antineoplastic immunotherapy 02/01/2018   Goals of care, counseling/discussion 11/17/2017   High risk medication use 05/29/2016   04/29/2016: ==> plq 200 am & 100 qhs(adeq response).   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[facility]) 11/04/2018   Other fatigue 05/29/2016   Pericardial effusion 10/23/2020  Primary malignant neoplasm of bronchus of right lower lobe ([facility]) 12/21/2017   Rheumatoid arthritis (HCC)    Rheumatoid arthritis involving multiple sites with positive rheumatoid factor ([facility]) 05/29/2016   +RF +ANA +CCP    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   Smoker  05/29/2016   Trigger finger, left ring finger 05/29/2016   Trigger finger, right ring finger 05/29/2016   Vitamin D deficiency 05/29/2016    ALLERGIES:  has No Known Allergies.  MEDICATIONS:  Current Outpatient Medications  Medication Sig Dispense Refill   acetaminophen (TYLENOL) 325 MG tablet Take 2 tablets (650 mg total) by mouth every 6 (six) hours.     folic acid (FOLVITE) 1 MG tablet TAKE 1 TABLET(1 MG) BY MOUTH DAILY 30 tablet 4   hydroxychloroquine (PLAQUENIL) 200 MG tablet TAKE 1 TABLET BY MOUTH TWICE DAILY MONDAY THROUGH FRIDAY ONLY. 120 tablet 0   omeprazole (PRILOSEC) 20 MG capsule Take 20 mg by mouth daily.     prochlorperazine (COMPAZINE) 10 MG tablet TAKE 1 TABLET(10 MG) BY MOUTH EVERY 6 HOURS AS NEEDED FOR NAUSEA OR VOMITING 30 tablet 2   vitamin C (ASCORBIC ACID) 500 MG tablet Take 500 mg by mouth daily.     VITAMIN D PO Take 1 tablet by mouth daily.     No current facility-administered medications for this visit.    SURGICAL HISTORY:  Past Surgical History:  Procedure Laterality Date   BRONCHIAL NEEDLE ASPIRATION BIOPSY  11/09/2017   Procedure: BRONCHIAL NEEDLE ASPIRATION BIOPSIES;  Surgeon: Marshell Garfinkel, MD;  Location: [facility];  Service: Cardiopulmonary;;   CHEST TUBE INSERTION Left 05/13/2021   Procedure: INSERTION PLEURAL DRAINAGE CATHETER;  Surgeon: Lajuana Matte, MD;  Location: [facility];  Service: Thoracic;  Laterality: Left;   ENDOBRONCHIAL ULTRASOUND Bilateral 11/09/2017   Procedure: ENDOBRONCHIAL ULTRASOUND;  Surgeon: Marshell Garfinkel, MD;  Location: [facility];  Service: Cardiopulmonary;  Laterality: Bilateral;   NASAL SINUS SURGERY     NASAL SINUS SURGERY     THORACENTESIS Right 02/07/2021   Procedure: THORACENTESIS;  Surgeon: Lajuana Matte, MD;  Location: [facility];  Service: Thoracic;  Laterality: Right;   TUBAL LIGATION     VIDEO BRONCHOSCOPY  11/09/2017   Procedure: VIDEO BRONCHOSCOPY;  Surgeon: Marshell Garfinkel, MD;  Location: [facility];  Service: Cardiopulmonary;;   XI ROBOTIC ASSISTED PERICARDIAL WINDOW Left 02/07/2021   Procedure: XI ROBOTIC ASSISTED THORACOSCOPY PERICARDIAL WINDOW;  Surgeon: Lajuana Matte, MD;  Location: [facility];  Service: Thoracic;  Laterality: Left;    REVIEW OF SYSTEMS:  A comprehensive review of systems was negative except for: Constitutional: positive for fatigue Respiratory: positive for dyspnea on exertion   PHYSICAL EXAMINATION: General appearance: alert, cooperative, fatigued, and no distress Head: Normocephalic, without obvious abnormality, atraumatic Neck: no adenopathy, no JVD, supple, symmetrical, trachea midline, and thyroid not enlarged, symmetric, no tenderness/mass/nodules Lymph nodes: Cervical, supraclavicular, and axillary nodes normal. Resp: clear to auscultation bilaterally Back: symmetric, no curvature. ROM normal. No CVA tenderness. Cardio: regular rate and rhythm, S1, S2 normal, no murmur, click, rub or gallop GI: soft, non-tender; bowel sounds normal; no masses,  no organomegaly Extremities: extremities normal, atraumatic, no cyanosis or edema  ECOG PERFORMANCE STATUS: 1 - Symptomatic but completely ambulatory  Last menstrual period 04/28/2000.  LABORATORY DATA: Lab Results  Component Value Date   WBC 7.5 05/22/2021   HGB 8.1 (L) 05/22/2021   HCT 26.1 (L) 05/22/2021   MCV 108.3 (H) 05/22/2021   PLT 322 05/22/2021  Chemistry      Component Value Date/Time   NA 136 05/22/2021 1026   K 4.1 05/22/2021 1026   CL 100 05/22/2021 1026   CO2 30 05/22/2021 1026   BUN 14 05/22/2021 1026   CREATININE 1.10 (H) 05/22/2021 1026   CREATININE 0.80 07/24/2017 1438      Component Value Date/Time   CALCIUM 10.5 (H) 05/22/2021 1026   ALKPHOS 61 05/22/2021 1026   AST 20 05/22/2021 1026   ALT 6 05/22/2021 1026   BILITOT 0.3 05/22/2021 1026       RADIOGRAPHIC STUDIES: DG Chest Port 1 View  Result Date: 05/14/2021 CLINICAL DATA:  66 year old female with  pleural effusion and shortness of breath. Small cell lung cancer. EXAM: PORTABLE CHEST 1 VIEW COMPARISON:  Portable chest 05/13/2021 and earlier. FINDINGS: Portable AP upright view at 0638 hours. Stable left chest tube. No pneumothorax. Mildly improved lung volumes. Stable cardiac size and mediastinal contours. Architectural distortion and pleural effusion in the right lung appears stable. Stable left lung ventilation with widespread fine reticulonodular opacity. Stable visualized osseous structures. Negative visible bowel gas. IMPRESSION: Stable left chest tube and ventilation with no pneumothorax. Abnormal left lung reticulonodular opacity and right lung pleural effusion with architectural distortion. Electronically Signed   By: Genevie Ann M.D.   On: 05/14/2021 07:11   DG Chest Port 1 View  Result Date: 05/13/2021 CLINICAL DATA:  Pleural effusion.  Postop. EXAM: PORTABLE CHEST 1 VIEW COMPARISON:  05/10/2021 FINDINGS: Patient has small bore LEFT-sided pleural catheter. LEFT-sided pleural effusion is decreased. Small RIGHT pleural effusion persists, associated with subsegmental atelectasis in the perihilar region. There is mild prominence of interstitial markings throughout the lungs. Heart size is normal. No pneumothorax. IMPRESSION: Interval placement of LEFT-sided pleural catheter. Smaller LEFT pleural effusion. No pneumothorax. Electronically Signed   By: Nolon Nations M.D.   On: 05/13/2021 14:06    ASSESSMENT AND PLAN: This is a very pleasant 66 years old African-American female with metastatic non-small cell lung cancer initially diagnosed with a stage IIIA non-small cell lung cancer, adenocarcinoma.  She underwent a course of concurrent chemoradiation with weekly carboplatin and paclitaxel status post 7 cycles with partial response.   The patient tolerated this course of treatment well except for mild odynophagia and dysphagia. She completed on consolidation treatment with immunotherapy with Imfinzi  (Durvalumab) status post 18 cycles. She also completed SBRT to the right supraclavicular lymphadenopathy. The patient had evidence for multiple brain metastasis in October 2020 and she underwent SRS treatment to this lesion under the care of Dr. Lisbeth Renshaw. The patient had evidence for disease progression and she started systemic chemotherapy with carboplatin, Alimta and Keytruda status post 30 cycles.  Starting from cycle #5 she is on maintenance treatment with Alimta and Keytruda every 3 weeks.  Starting from cycle #30 the patient is on treatment with single agent Keytruda 200 Mg IV every 3 weeks.  Alimta was discontinued secondary to intolerance. The patient is tolerating her treatment with Beryle Flock much better. I recommended for her to proceed with cycle #31 today as planned. I will see her back for follow-up visit in 3 weeks for evaluation before starting cycle #32. I will consider repeating her imaging studies after the next cycle of her treatment. For the anemia she will continue on the oral iron tablets. For the pleural effusion, she will continue drainage via the Pleurx catheter. The patient was advised to call immediately if she has any other concerning symptoms in the interval. The patient voices  understanding of current disease status and treatment options and is in agreement with the current care plan.  All questions were answered. The patient knows to call the clinic with any problems, questions or concerns. We can certainly see the patient much sooner if necessary.  The total time spent in the appointment was 30 minutes.  Disclaimer: This note was dictated with voice recognition software. Similar sounding words can inadvertently be transcribed and may not be corrected upon review.

## 2021-06-12 NOTE — Progress Notes (Signed)
Nutrition Follow-up:  Patient is currently receiving maintenance Keytruda for metastatic lung cancer. S/p Pleurx catheter with Dr. Kipp Brood 05/13/21.   Met with patient during infusion. Patient is using supplemental oxygen. She reports ongoing weakness, but feeling better since having pleurex catheter placed. Patient has She is draining this twice/week. Patient reports appetite has improved, but not as strong as it was a few weeks ago while taking medrol Dosepak. Patient is eating 3 meals/day. She is drinking Ensure Original twice daily. This is very filling. Patient reports she often drinks one for her breakfast meal. Patient denies decreased intake secondary to increased work of breathing. Denies nausea, vomiting, diarrhea, constipation.   Medications: reviewed   Labs: reviewed   Anthropometrics: Weight 103 lb 1.6 oz today increased  1/25 - 101 lb 12.8 oz    NUTRITION DIAGNOSIS: Unintended weight loss stable    INTERVENTION:  Encouraged high calorie high protein snacks in between meals  Suggested pt try CIB mixed with whole milk as alternate supplement - did not have sample packets available today for pt to try Reviewed ways to add calories/protein to foods Provided active listening and encouragement  Contact information provided     MONITORING, EVALUATION, GOAL: weight trends, intake    NEXT VISIT: Wednesday March 29 during infusion

## 2021-06-14 ENCOUNTER — Ambulatory Visit (INDEPENDENT_AMBULATORY_CARE_PROVIDER_SITE_OTHER): Payer: Medicare PPO | Admitting: Thoracic Surgery (Cardiothoracic Vascular Surgery)

## 2021-06-14 ENCOUNTER — Other Ambulatory Visit: Payer: Self-pay

## 2021-06-14 DIAGNOSIS — Z938 Other artificial opening status: Secondary | ICD-10-CM | POA: Diagnosis not present

## 2021-06-14 NOTE — Progress Notes (Signed)
°   °  [address]       [place] [address]             [phone]       Patient: Home Provider: Office Consent for Telemedicine visit obtained.  Todays visit was completed via a real-time telehealth (see specific modality noted below). The patient/authorized person provided oral consent at the time of the visit to engage in a telemedicine encounter with the present provider at [facility]. The patient/authorized person was informed of the potential benefits, limitations, and risks of telemedicine. The patient/authorized person expressed understanding that the laws that protect confidentiality also apply to telemedicine. The patient/authorized person acknowledged understanding that telemedicine does not provide emergency services and that he or she would need to call 911 or proceed to the nearest hospital for help if such a need arose.   Total time spent in the clinical discussion 10 minutes.  Telehealth Modality: Phone visit (audio only)  I had a telephone visit with Mrs. Meharg.  She is currently draining her Pleurx catheter twice a week.  The last 2 drainages were 250 mL, and 150 mL.  She does not think that she is ready to go to once a week given the previous drainage outputs.  We will plan to revisit her outputs in 2 weeks.  She will continue with biweekly drainages until then.

## 2021-06-21 ENCOUNTER — Telehealth: Payer: Self-pay | Admitting: Internal Medicine

## 2021-06-21 NOTE — Telephone Encounter (Signed)
Called patient regarding upcoming March appointment, patient is notified.

## 2021-06-25 ENCOUNTER — Ambulatory Visit: Payer: Medicare PPO | Admitting: Internal Medicine

## 2021-06-25 ENCOUNTER — Ambulatory Visit: Payer: Medicare PPO

## 2021-06-25 ENCOUNTER — Other Ambulatory Visit: Payer: Medicare PPO

## 2021-06-28 ENCOUNTER — Ambulatory Visit (INDEPENDENT_AMBULATORY_CARE_PROVIDER_SITE_OTHER): Payer: Medicare PPO | Admitting: Thoracic Surgery (Cardiothoracic Vascular Surgery)

## 2021-06-28 ENCOUNTER — Other Ambulatory Visit: Payer: Self-pay

## 2021-06-28 DIAGNOSIS — Z938 Other artificial opening status: Secondary | ICD-10-CM | POA: Diagnosis not present

## 2021-06-28 NOTE — Progress Notes (Signed)
[place] OFFICE PROGRESS NOTE  Revankar, Reita Cliche, MD [address] [place] [address]  DIAGNOSIS:  Metastatic non-small cell lung cancer initially diagnosed as stage IIIA (T3, N1/N2, M0) non-small cell lung cancer, adenocarcinoma presented with large right lower lobe lung mass with extension to the right hilum and subcarinal area diagnosed in July 2019.  She has brain metastasis in October 2020.   Biomarker Findings Tumor Mutational Burden - TMB-Intermediate (6 Muts/Mb) Microsatellite status - MS-Stable Genomic Findings For a complete list of the genes assayed, please refer to the Appendix. NRAS Q61R ARAF amplification STK11 G56W KRAS G13D MYCN amplification MCL1 amplification NKX2-1 amplification - equivocal TP53 G245V 7 Disease relevant genes with no reportable alterations: EGFR, ALK, BRAF, MET, ERBB2, RET, ROS1   PRIOR THERAPY: 1) Course of concurrent chemoradiation with weekly carboplatin for AUC of 2 and paclitaxel 45 mg/M2.  Status post 7 cycles.  Last dose was giving 01/11/2018. 2) Consolidation treatment with immunotherapy with Imfinzi (Durvalumab) 10 mg/KG every 2 weeks.  First dose February 09, 2018.  Status post 19 cycles. 3) status post stereotactic body radiotherapy to the enlarging right supraclavicular lymphadenopathy under the care of Dr. Lisbeth Renshaw. 4) SRS to multiple brain metastasis under the care of Dr. Lisbeth Renshaw.  CURRENT THERAPY:  Systemic chemotherapy with carboplatin for AUC of 5, Alimta 500 mg/M2 and Keytruda 200 mg IV every 3 weeks.  First dose 08/16/2019.  Status post 31 cycles.  Starting from cycle #5 the patient is on maintenance treatment with Alimta and Keytruda every 3 weeks. Alimta was reduced to 400 mg/m2 starting from cycle #21. Alimta removed from the treatment plan starting from cycle #30 due to cytopenias  INTERVAL HISTORY: Susan Holder 66 y.o. female returns to the clinic today for a follow-up visit.  The patient  was last seen in the clinic on 06/12/21.  Over the past several months, the patient is having progressive symptoms of fatigue, weakness, and weight loss. She also was having cytopenias. Therefore, she is now on single agent Keytruda. Her Alimta has been removed from her treatment plan. She is currently on supplemental oxygen via nasal cannula.  She also follows with Dr. Kipp Brood due to her Pleurx catheter which she is draining ~153m per week 1x per week. She is expected to see him next month for evaluation and consideration of pleurex removal.  She is also followed closely by member the nutritionist team for her weight loss. The patient drinks Ensure once-twice a day due to her weight loss.     Today, she notes that she is having a good day from an energy perspective. She notes her energy is the best today compared to how it has been. She denies any fever, chills, or night sweats.  She denies any sick contacts.  She denies any chest pain or hemoptysis.  She has a baseline chronic dry cough.  She denies any changes with her baseline dyspnea on exertion. She has been using her supplemental oxygen. She thinks she does not need the oxygen as much and is going to buy a pulse oximeter to monitor her oxygen at home. Denies any nausea, vomiting, diarrhea, or constipation.  Denies any headache or visual changes.  Denies any rashes or skin changes.  She is here today for evaluation before considering starting cycle #32     MEDICAL HISTORY: Past Medical History:  Diagnosis Date   Acid reflux 09/21/2018   Adenocarcinoma of right lung, stage 4 ([place]  11/17/2017   Allergic rhinitis 08/22/2013   ANA positive 05/29/2016   Aortic atherosclerosis ([place]) 10/23/2020   Brain metastases ([place]) 03/29/2019   COPD (chronic obstructive pulmonary disease) ([place])    Coronary artery calcification seen on CT scan 10/23/2020   DDD (degenerative disc disease), cervical 05/29/2016   Dyspnea    Encounter for antineoplastic  chemotherapy 11/17/2017   Encounter for antineoplastic immunotherapy 02/01/2018   Goals of care, counseling/discussion 11/17/2017   High risk medication use 05/29/2016   04/29/2016: ==> plq 200 am & 100 qhs(adeq response).   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[place]) 11/04/2018   Other fatigue 05/29/2016   Pericardial effusion 10/23/2020   Primary malignant neoplasm of bronchus of right lower lobe ([place]) 12/21/2017   Rheumatoid arthritis ([place])    Rheumatoid arthritis involving multiple sites with positive rheumatoid factor ([place]) 05/29/2016   +RF +ANA +CCP    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   Smoker 05/29/2016   Trigger finger, left ring finger 05/29/2016   Trigger finger, right ring finger 05/29/2016   Vitamin D deficiency 05/29/2016    ALLERGIES:  has No Known Allergies.  MEDICATIONS:  Current Outpatient Medications  Medication Sig Dispense Refill   acetaminophen (TYLENOL) 325 MG tablet Take 2 tablets (650 mg total) by mouth every 6 (six) hours.     hydroxychloroquine (PLAQUENIL) 200 MG tablet TAKE 1 TABLET BY MOUTH TWICE DAILY MONDAY THROUGH FRIDAY ONLY. 120 tablet 0   omeprazole (PRILOSEC) 20 MG capsule Take 20 mg by mouth daily.     prochlorperazine (COMPAZINE) 10 MG tablet TAKE 1 TABLET(10 MG) BY MOUTH EVERY 6 HOURS AS NEEDED FOR NAUSEA OR VOMITING 30 tablet 2   vitamin C (ASCORBIC ACID) 500 MG tablet Take 500 mg by mouth daily.     VITAMIN D PO Take 1 tablet by mouth daily.     No current facility-administered medications for this visit.    SURGICAL HISTORY:  Past Surgical History:  Procedure Laterality Date   BRONCHIAL NEEDLE ASPIRATION BIOPSY  11/09/2017   Procedure: BRONCHIAL NEEDLE ASPIRATION BIOPSIES;  Surgeon: Marshell Garfinkel, MD;  Location: [facility];  Service: Cardiopulmonary;;    CHEST TUBE INSERTION Left 05/13/2021   Procedure: INSERTION PLEURAL DRAINAGE CATHETER;  Surgeon: Lajuana Matte, MD;  Location: [facility];  Service: Thoracic;  Laterality: Left;   ENDOBRONCHIAL ULTRASOUND Bilateral 11/09/2017   Procedure: ENDOBRONCHIAL ULTRASOUND;  Surgeon: Marshell Garfinkel, MD;  Location: [facility];  Service: Cardiopulmonary;  Laterality: Bilateral;   NASAL SINUS SURGERY     NASAL SINUS SURGERY     THORACENTESIS Right 02/07/2021   Procedure: THORACENTESIS;  Surgeon: Lajuana Matte, MD;  Location: [facility];  Service: Thoracic;  Laterality: Right;   TUBAL LIGATION     VIDEO BRONCHOSCOPY  11/09/2017   Procedure: VIDEO BRONCHOSCOPY;  Surgeon: Marshell Garfinkel, MD;  Location: [facility];  Service: Cardiopulmonary;;   XI ROBOTIC ASSISTED PERICARDIAL WINDOW Left 02/07/2021   Procedure: XI ROBOTIC ASSISTED THORACOSCOPY PERICARDIAL WINDOW;  Surgeon: Lajuana Matte, MD;  Location: [facility];  Service: Thoracic;  Laterality: Left;    REVIEW OF SYSTEMS:   Review of Systems  Constitutional: Positive for fatigue and weakness (improved compared to prior). Negative for chills and fever.  HENT: Negative for mouth sores, nosebleeds, sore throat and trouble swallowing.  Eyes: Negative for eye problems and icterus.  Respiratory: Positive for stable shortness of breath.  Positive for chronic cough.  Negative for hemoptysis and wheezing.   Cardiovascular: Negative for chest pain and leg swelling.  Gastrointestinal: Negative for abdominal pain, constipation, diarrhea, nausea and vomiting.  Genitourinary: Negative for bladder incontinence, difficulty urinating, dysuria, frequency and hematuria.   Musculoskeletal: Negative for back pain, gait problem, neck pain and neck stiffness.  Skin: Negative for itching and rash.  Neurological: Negative for dizziness, extremity weakness, gait problem, headaches, light-headedness and seizures.  Hematological: Negative for adenopathy. Does not  bruise/bleed easily.  Psychiatric/Behavioral: Negative for confusion, depression and sleep disturbance. The patient is not nervous/anxious.       PHYSICAL EXAMINATION:  Blood pressure 122/68, pulse (!) 102, temperature 97.6 F (36.4 C), temperature source Tympanic, resp. rate 17, weight 105 lb (47.6 kg), last menstrual period 04/28/2000, SpO2 100 %.  ECOG PERFORMANCE STATUS: 2  Physical Exam  Constitutional: Oriented to person, place, and time and cachectic appearing female and in no distress.  HENT:  Head: Normocephalic and atraumatic.  Mouth/Throat: Oropharynx is clear and moist. No oropharyngeal exudate.  Eyes: Conjunctivae are normal. Right eye exhibits no discharge. Left eye exhibits no discharge. No scleral icterus.  Neck: Normal range of motion. Neck supple.  Cardiovascular: Normal rate, regular rhythm, normal heart sounds and intact distal pulses.   Pulmonary/Chest: Effort normal. Quiet breath sounds in all lung fields. No respiratory distress.  On supplemental oxygen. Abdominal: Soft. Bowel sounds are normal. Exhibits no distension and no mass. There is no tenderness.  Musculoskeletal: Normal range of motion. Exhibits no edema.  Lymphadenopathy:    No cervical adenopathy.  Neurological: Alert and oriented to person, place, and time. Exhibits muscle wasting.  The patient is examined in the wheelchair.   Skin: Skin is warm and dry. No rash noted. Not diaphoretic. No erythema. No pallor.  Psychiatric: Mood, memory and judgment normal.  Vitals reviewed.  LABORATORY DATA: Lab Results  Component Value Date   WBC 8.1 07/03/2021   HGB 9.3 (L) 07/03/2021   HCT 29.7 (L) 07/03/2021   MCV 104.6 (H) 07/03/2021   PLT 279 07/03/2021      Chemistry      Component Value Date/Time   NA 137 07/03/2021 0953   K 3.6 07/03/2021 0953   CL 103 07/03/2021 0953   CO2 30 07/03/2021 0953   BUN 16 07/03/2021 0953   CREATININE 1.20 (H) 07/03/2021 0953   CREATININE 0.80 07/24/2017 1438       Component Value Date/Time   CALCIUM 10.8 (H) 07/03/2021 0953   ALKPHOS 83 07/03/2021 0953   AST 17 07/03/2021 0953   ALT 5 07/03/2021 0953   BILITOT 0.2 (L) 07/03/2021 0953       RADIOGRAPHIC STUDIES:  No results found.   ASSESSMENT/PLAN:  This is a very pleasant 66 year old African American female with metastatic non-small cell lung cancer, adenocarcinoma initially diagnosed as IIIA in July 2019. She had evidence of metastatic disease with brain metastases in October 2020. She has no actionable mutations.    She is status post 7 cycles of concurrent chemoradiation with carboplatin for an AUC of 2 and paclitaxel 45 mg/m2. She had a partial response.   She then underwent consolidation immunotherapy with imfinzi. She is status post 18 cycles.   She completed SBRT to the right supraclavicular lymph node.   She had evidence of multiple brain metastases in October 2020. She underwent SRS treatment to these  lesions under the care of Dr. Lisbeth Renshaw.   She currently is undergoing systemic chemotherapy with carboplatin for an AUC of 5, Alimta 500 mg/m2 and Keytruda 200 mg IV every 3 weeks. She is status post 31 cycles. Starting from cycle #5 she started maintenance Alimta and Keytruda. Starting from cycle #21, Dr. Julien Nordmann reduced her alimta to 400 mg/m2 due to fatigue.  Alimta was ultimately discontinued due to persistent anemia/cytopenias despite not having chemotherapy in several weeks.  Therefore, she is on current agent immunotherapy with Keytruda.  Labs were reviewed.  Recommend that she proceed with cycle #32 today as scheduled.  I will arrange for restaging CT scan the chest, abdomen, and pelvis prior to starting her next cycle of treatment.    We will see her back for follow-up visit in 3 weeks for evaluation before starting cycle #33 and to review her scan results.  Her calcium is a little high today, likely from her supplemental drinks. She is not taking any multivitamins or calcium  supplements. I advised her to increase her hydration with water and avoid calcium rich foods and we will continue to monitor her calcium  She will continue to follow closely with Dr. Kipp Brood about her Pleurx catheter.  I advised her to purchase a pulse ox at home to monitor her oxygen. She was prescribed oxygen PRN but she is not sure what she runs at home without it.   The patient was advised to call immediately if she has any concerning symptoms in the interval. The patient voices understanding of current disease status and treatment options and is in agreement with the current care plan. All questions were answered. The patient knows to call the clinic with any problems, questions or concerns. We can certainly see the patient much sooner if necessary         Orders Placed This Encounter  Procedures   CT Chest W Contrast    Standing Status:   Future    Standing Expiration Date:   07/03/2022    Order Specific Question:   If indicated for the ordered procedure, I authorize the administration of contrast media per Radiology protocol    Answer:   Yes    Order Specific Question:   Preferred imaging location?    Answer:   [facility]   CT Abdomen Pelvis W Contrast    Standing Status:   Future    Standing Expiration Date:   07/03/2022    Order Specific Question:   If indicated for the ordered procedure, I authorize the administration of contrast media per Radiology protocol    Answer:   Yes    Order Specific Question:   Preferred imaging location?    Answer:   [facility]    Order Specific Question:   Is Oral Contrast requested for this exam?    Answer:   Yes, Per Radiology protocol   CBC with Differential ([facility] Only)    Standing Status:   Standing    Number of Occurrences:   10    Standing Expiration Date:   07/04/2022   CMP ([facility] only)    Standing Status:   Standing    Number of Occurrences:   10    Standing Expiration Date:   07/04/2022   TSH     Standing Status:   Standing    Number of Occurrences:   10    Standing Expiration Date:   07/04/2022     The total time spent  in the appointment was 20-29 minutes.   Susan Colantonio L Sweden Lesure, PA-C 07/03/21

## 2021-06-28 NOTE — Progress Notes (Signed)
?   ?  [address] ?      [place] [address] ?            [phone]      ? ?Patient: Home ?Provider: Office ?Consent for Telemedicine visit obtained. ? ?Today?s visit was completed via a real-time telehealth (see specific modality noted below). The patient/authorized person provided oral consent at the time of the visit to engage in a telemedicine encounter with the present provider at [facility]. The patient/authorized person was informed of the potential benefits, limitations, and risks of telemedicine. The patient/authorized person expressed understanding that the laws that protect confidentiality also apply to telemedicine. The patient/authorized person acknowledged understanding that telemedicine does not provide emergency services and that he or she would need to call 911 or proceed to the nearest hospital for help if such a need arose. ? ? Total time spent in the clinical discussion 10 minutes. ? Telehealth Modality: Phone visit (audio only) ? ?I had a telephone visit with Susan Holder.  She continues to drain the Pleurx twice weekly.  Her last drainage was 125 mL.  I have advised her to go to once a week.  I will see her in clinic in 1 month with a CT chest. ? ?

## 2021-07-01 ENCOUNTER — Other Ambulatory Visit: Payer: Self-pay | Admitting: Thoracic Surgery (Cardiothoracic Vascular Surgery)

## 2021-07-01 DIAGNOSIS — C3491 Malignant neoplasm of unspecified part of right bronchus or lung: Secondary | ICD-10-CM

## 2021-07-03 ENCOUNTER — Inpatient Hospital Stay: Payer: Medicare PPO | Admitting: Physician Assistant

## 2021-07-03 ENCOUNTER — Inpatient Hospital Stay: Payer: Medicare PPO | Attending: Internal Medicine

## 2021-07-03 ENCOUNTER — Inpatient Hospital Stay: Payer: Medicare PPO

## 2021-07-03 ENCOUNTER — Other Ambulatory Visit: Payer: Self-pay

## 2021-07-03 ENCOUNTER — Encounter: Payer: Self-pay | Admitting: Physician Assistant

## 2021-07-03 VITALS — HR 98

## 2021-07-03 VITALS — BP 122/68 | HR 102 | Temp 97.6°F | Resp 17 | Wt 105.0 lb

## 2021-07-03 DIAGNOSIS — K219 Gastro-esophageal reflux disease without esophagitis: Secondary | ICD-10-CM | POA: Diagnosis not present

## 2021-07-03 DIAGNOSIS — R634 Abnormal weight loss: Secondary | ICD-10-CM | POA: Diagnosis not present

## 2021-07-03 DIAGNOSIS — J449 Chronic obstructive pulmonary disease, unspecified: Secondary | ICD-10-CM | POA: Insufficient documentation

## 2021-07-03 DIAGNOSIS — J309 Allergic rhinitis, unspecified: Secondary | ICD-10-CM | POA: Diagnosis not present

## 2021-07-03 DIAGNOSIS — Z5112 Encounter for antineoplastic immunotherapy: Secondary | ICD-10-CM | POA: Insufficient documentation

## 2021-07-03 DIAGNOSIS — E559 Vitamin D deficiency, unspecified: Secondary | ICD-10-CM | POA: Diagnosis not present

## 2021-07-03 DIAGNOSIS — J910 Malignant pleural effusion: Secondary | ICD-10-CM | POA: Insufficient documentation

## 2021-07-03 DIAGNOSIS — C3431 Malignant neoplasm of lower lobe, right bronchus or lung: Secondary | ICD-10-CM | POA: Insufficient documentation

## 2021-07-03 DIAGNOSIS — I700 Atherosclerosis of aorta: Secondary | ICD-10-CM | POA: Insufficient documentation

## 2021-07-03 DIAGNOSIS — M069 Rheumatoid arthritis, unspecified: Secondary | ICD-10-CM | POA: Diagnosis not present

## 2021-07-03 DIAGNOSIS — C3491 Malignant neoplasm of unspecified part of right bronchus or lung: Secondary | ICD-10-CM

## 2021-07-03 DIAGNOSIS — Z79899 Other long term (current) drug therapy: Secondary | ICD-10-CM | POA: Insufficient documentation

## 2021-07-03 DIAGNOSIS — D649 Anemia, unspecified: Secondary | ICD-10-CM | POA: Insufficient documentation

## 2021-07-03 DIAGNOSIS — I2510 Atherosclerotic heart disease of native coronary artery without angina pectoris: Secondary | ICD-10-CM | POA: Diagnosis not present

## 2021-07-03 DIAGNOSIS — R059 Cough, unspecified: Secondary | ICD-10-CM | POA: Insufficient documentation

## 2021-07-03 DIAGNOSIS — R0609 Other forms of dyspnea: Secondary | ICD-10-CM | POA: Diagnosis not present

## 2021-07-03 DIAGNOSIS — R5383 Other fatigue: Secondary | ICD-10-CM | POA: Diagnosis not present

## 2021-07-03 DIAGNOSIS — C7931 Secondary malignant neoplasm of brain: Secondary | ICD-10-CM | POA: Insufficient documentation

## 2021-07-03 LAB — CMP (CANCER CENTER ONLY)
ALT: 5 U/L (ref 0–44)
AST: 17 U/L (ref 15–41)
Albumin: 3.1 g/dL — ABNORMAL LOW (ref 3.5–5.0)
Alkaline Phosphatase: 83 U/L (ref 38–126)
Anion gap: 4 — ABNORMAL LOW (ref 5–15)
BUN: 16 mg/dL (ref 8–23)
CO2: 30 mmol/L (ref 22–32)
Calcium: 10.8 mg/dL — ABNORMAL HIGH (ref 8.9–10.3)
Chloride: 103 mmol/L (ref 98–111)
Creatinine: 1.2 mg/dL — ABNORMAL HIGH (ref 0.44–1.00)
GFR, Estimated: 50 mL/min — ABNORMAL LOW (ref >60)
Glucose, Bld: 85 mg/dL (ref 70–99)
Potassium: 3.6 mmol/L (ref 3.5–5.1)
Sodium: 137 mmol/L (ref 135–145)
Total Bilirubin: 0.2 mg/dL — ABNORMAL LOW (ref 0.3–1.2)
Total Protein: 7.1 g/dL (ref 6.5–8.1)

## 2021-07-03 LAB — CBC WITH DIFFERENTIAL (CANCER CENTER ONLY)
Abs Immature Granulocytes: 0.03 10*3/uL (ref 0.00–0.07)
Basophils Absolute: 0 10*3/uL (ref 0.0–0.1)
Basophils Relative: 0 %
Eosinophils Absolute: 0.1 10*3/uL (ref 0.0–0.5)
Eosinophils Relative: 1 %
HCT: 29.7 % — ABNORMAL LOW (ref 36.0–46.0)
Hemoglobin: 9.3 g/dL — ABNORMAL LOW (ref 12.0–15.0)
Immature Granulocytes: 0 %
Lymphocytes Relative: 13 %
Lymphs Abs: 1.1 10*3/uL (ref 0.7–4.0)
MCH: 32.7 pg (ref 26.0–34.0)
MCHC: 31.3 g/dL (ref 30.0–36.0)
MCV: 104.6 fL — ABNORMAL HIGH (ref 80.0–100.0)
Monocytes Absolute: 0.9 10*3/uL (ref 0.1–1.0)
Monocytes Relative: 11 %
Neutro Abs: 6 10*3/uL (ref 1.7–7.7)
Neutrophils Relative %: 75 %
Platelet Count: 279 10*3/uL (ref 150–400)
RBC: 2.84 MIL/uL — ABNORMAL LOW (ref 3.87–5.11)
RDW: 14 % (ref 11.5–15.5)
WBC Count: 8.1 10*3/uL (ref 4.0–10.5)
nRBC: 0 % (ref 0.0–0.2)

## 2021-07-03 MED ORDER — SODIUM CHLORIDE 0.9 % IV SOLN
200.0000 mg | Freq: Once | INTRAVENOUS | Status: AC
  Administered 2021-07-03: 200 mg via INTRAVENOUS
  Filled 2021-07-03: qty 200

## 2021-07-03 MED ORDER — SODIUM CHLORIDE 0.9 % IV SOLN: Freq: Once | INTRAVENOUS | Status: AC

## 2021-07-03 NOTE — Patient Instructions (Signed)
[facility]  ? Discharge Instructions: ?Thank you for choosing [facility] to provide your oncology and hematology care.  ? ?If you have a lab appointment with the [facility], please go directly to the [facility] and check in at the registration area. ?  ?Wear comfortable clothing and clothing appropriate for easy access to any Portacath or PICC line.  ? ?We strive to give you quality time with your provider. You may need to reschedule your appointment if you arrive late (15 or more minutes).  Arriving late affects you and other patients whose appointments are after yours.  Also, if you miss three or more appointments without notifying the office, you may be dismissed from the clinic at the provider?s discretion.    ?  ?For prescription refill requests, have your pharmacy contact our office and allow 72 hours for refills to be completed.   ? ?Today you received the following chemotherapy and/or immunotherapy agents: pembrolizumab    ?  ?To help prevent nausea and vomiting after your treatment, we encourage you to take your nausea medication as directed. ? ?BELOW ARE SYMPTOMS THAT SHOULD BE REPORTED IMMEDIATELY: ?*FEVER GREATER THAN 100.4 F (38 ?C) OR HIGHER ?*CHILLS OR SWEATING ?*NAUSEA AND VOMITING THAT IS NOT CONTROLLED WITH YOUR NAUSEA MEDICATION ?*UNUSUAL SHORTNESS OF BREATH ?*UNUSUAL BRUISING OR BLEEDING ?*URINARY PROBLEMS (pain or burning when urinating, or frequent urination) ?*BOWEL PROBLEMS (unusual diarrhea, constipation, pain near the anus) ?TENDERNESS IN MOUTH AND THROAT WITH OR WITHOUT PRESENCE OF ULCERS (sore throat, sores in mouth, or a toothache) ?UNUSUAL RASH, SWELLING OR PAIN  ?UNUSUAL VAGINAL DISCHARGE OR ITCHING  ? ?Items with * indicate a potential emergency and should be followed up as soon as possible or go to the Emergency Department if any problems should occur. ? ?Please show the CHEMOTHERAPY ALERT CARD or IMMUNOTHERAPY ALERT CARD at  check-in to the Emergency Department and triage nurse. ? ?Should you have questions after your visit or need to cancel or reschedule your appointment, please contact [facility]  Dept: [phone]  and follow the prompts.  Office hours are 8:00 a.m. to 4:30 p.m. Monday - Friday. Please note that voicemails left after 4:00 p.m. may not be returned until the following business day.  We are closed weekends and major holidays. You have access to a nurse at all times for urgent questions. Please call the main number to the clinic Dept: [phone] and follow the prompts. ? ? ?For any non-urgent questions, you may also contact your provider using MyChart. We now offer e-Visits for anyone 88 and older to request care online for non-urgent symptoms. For details visit [email or web address]. ?  ?Also download the MyChart app! Go to the app store, search "MyChart", open the app, select [facility], and log in with your MyChart username and password. ? ?Due to Covid, a mask is required upon entering the hospital/clinic. If you do not have a mask, one will be given to you upon arrival. For doctor visits, patients may have 1 support person aged 26 or older with them. For treatment visits, patients cannot have anyone with them due to current Covid guidelines and our immunocompromised population.  ? ?

## 2021-07-23 ENCOUNTER — Ambulatory Visit (HOSPITAL_COMMUNITY)
Admission: RE | Admit: 2021-07-23 | Discharge: 2021-07-23 | Disposition: A | Discharge status: Home / Self Care | Payer: Medicare PPO | Source: Ambulatory Visit | Attending: Physician Assistant | Admitting: Physician Assistant

## 2021-07-23 ENCOUNTER — Other Ambulatory Visit: Payer: Self-pay

## 2021-07-23 ENCOUNTER — Encounter (HOSPITAL_COMMUNITY): Payer: Self-pay

## 2021-07-23 DIAGNOSIS — J432 Centrilobular emphysema: Secondary | ICD-10-CM | POA: Diagnosis not present

## 2021-07-23 DIAGNOSIS — C3490 Malignant neoplasm of unspecified part of unspecified bronchus or lung: Secondary | ICD-10-CM | POA: Diagnosis not present

## 2021-07-23 DIAGNOSIS — C3491 Malignant neoplasm of unspecified part of right bronchus or lung: Secondary | ICD-10-CM | POA: Insufficient documentation

## 2021-07-23 DIAGNOSIS — J479 Bronchiectasis, uncomplicated: Secondary | ICD-10-CM | POA: Diagnosis not present

## 2021-07-23 DIAGNOSIS — I3139 Other pericardial effusion (noninflammatory): Secondary | ICD-10-CM | POA: Diagnosis not present

## 2021-07-23 DIAGNOSIS — J90 Pleural effusion, not elsewhere classified: Secondary | ICD-10-CM | POA: Diagnosis not present

## 2021-07-23 DIAGNOSIS — R918 Other nonspecific abnormal finding of lung field: Secondary | ICD-10-CM | POA: Diagnosis not present

## 2021-07-23 MED ORDER — SODIUM CHLORIDE (PF) 0.9 % IJ SOLN
INTRAMUSCULAR | Status: AC
  Filled 2021-07-23: qty 50

## 2021-07-23 MED ORDER — IOHEXOL 300 MG/ML  SOLN
100.0000 mL | Freq: Once | INTRAMUSCULAR | Status: AC | PRN
  Administered 2021-07-23: 80 mL via INTRAVENOUS

## 2021-07-24 ENCOUNTER — Inpatient Hospital Stay: Payer: Medicare PPO

## 2021-07-24 ENCOUNTER — Inpatient Hospital Stay: Payer: Medicare PPO | Admitting: Dietician

## 2021-07-24 ENCOUNTER — Inpatient Hospital Stay: Payer: Medicare PPO | Admitting: Internal Medicine

## 2021-07-24 VITALS — HR 96

## 2021-07-24 VITALS — BP 120/61 | HR 104 | Temp 97.4°F | Resp 16 | Ht 60.0 in | Wt 105.5 lb

## 2021-07-24 DIAGNOSIS — C3491 Malignant neoplasm of unspecified part of right bronchus or lung: Secondary | ICD-10-CM

## 2021-07-24 DIAGNOSIS — C770 Secondary and unspecified malignant neoplasm of lymph nodes of head, face and neck: Secondary | ICD-10-CM

## 2021-07-24 DIAGNOSIS — R634 Abnormal weight loss: Secondary | ICD-10-CM | POA: Diagnosis not present

## 2021-07-24 DIAGNOSIS — D649 Anemia, unspecified: Secondary | ICD-10-CM | POA: Diagnosis not present

## 2021-07-24 DIAGNOSIS — C3431 Malignant neoplasm of lower lobe, right bronchus or lung: Secondary | ICD-10-CM

## 2021-07-24 DIAGNOSIS — Z5112 Encounter for antineoplastic immunotherapy: Secondary | ICD-10-CM

## 2021-07-24 DIAGNOSIS — R5383 Other fatigue: Secondary | ICD-10-CM | POA: Diagnosis not present

## 2021-07-24 DIAGNOSIS — C7931 Secondary malignant neoplasm of brain: Secondary | ICD-10-CM | POA: Diagnosis not present

## 2021-07-24 DIAGNOSIS — R059 Cough, unspecified: Secondary | ICD-10-CM | POA: Diagnosis not present

## 2021-07-24 DIAGNOSIS — J910 Malignant pleural effusion: Secondary | ICD-10-CM | POA: Diagnosis not present

## 2021-07-24 DIAGNOSIS — R0609 Other forms of dyspnea: Secondary | ICD-10-CM | POA: Diagnosis not present

## 2021-07-24 LAB — CMP (CANCER CENTER ONLY)
ALT: 5 U/L (ref 0–44)
AST: 17 U/L (ref 15–41)
Albumin: 3.3 g/dL — ABNORMAL LOW (ref 3.5–5.0)
Alkaline Phosphatase: 89 U/L (ref 38–126)
Anion gap: 7 (ref 5–15)
BUN: 23 mg/dL (ref 8–23)
CO2: 27 mmol/L (ref 22–32)
Calcium: 10.9 mg/dL — ABNORMAL HIGH (ref 8.9–10.3)
Chloride: 103 mmol/L (ref 98–111)
Creatinine: 1.31 mg/dL — ABNORMAL HIGH (ref 0.44–1.00)
GFR, Estimated: 45 mL/min — ABNORMAL LOW (ref >60)
Glucose, Bld: 90 mg/dL (ref 70–99)
Potassium: 3.8 mmol/L (ref 3.5–5.1)
Sodium: 137 mmol/L (ref 135–145)
Total Bilirubin: 0.2 mg/dL — ABNORMAL LOW (ref 0.3–1.2)
Total Protein: 7.8 g/dL (ref 6.5–8.1)

## 2021-07-24 LAB — CBC WITH DIFFERENTIAL (CANCER CENTER ONLY)
Abs Immature Granulocytes: 0.01 10*3/uL (ref 0.00–0.07)
Basophils Absolute: 0 10*3/uL (ref 0.0–0.1)
Basophils Relative: 0 %
Eosinophils Absolute: 0.1 10*3/uL (ref 0.0–0.5)
Eosinophils Relative: 1 %
HCT: 32.9 % — ABNORMAL LOW (ref 36.0–46.0)
Hemoglobin: 10.3 g/dL — ABNORMAL LOW (ref 12.0–15.0)
Immature Granulocytes: 0 %
Lymphocytes Relative: 14 %
Lymphs Abs: 1.1 10*3/uL (ref 0.7–4.0)
MCH: 32.2 pg (ref 26.0–34.0)
MCHC: 31.3 g/dL (ref 30.0–36.0)
MCV: 102.8 fL — ABNORMAL HIGH (ref 80.0–100.0)
Monocytes Absolute: 0.8 10*3/uL (ref 0.1–1.0)
Monocytes Relative: 11 %
Neutro Abs: 5.3 10*3/uL (ref 1.7–7.7)
Neutrophils Relative %: 74 %
Platelet Count: 326 10*3/uL (ref 150–400)
RBC: 3.2 MIL/uL — ABNORMAL LOW (ref 3.87–5.11)
RDW: 13.1 % (ref 11.5–15.5)
WBC Count: 7.3 10*3/uL (ref 4.0–10.5)
nRBC: 0 % (ref 0.0–0.2)

## 2021-07-24 LAB — TSH: TSH: 2.546 u[IU]/mL (ref 0.308–3.960)

## 2021-07-24 MED ORDER — SODIUM CHLORIDE 0.9 % IV SOLN
200.0000 mg | Freq: Once | INTRAVENOUS | Status: AC
  Administered 2021-07-24: 200 mg via INTRAVENOUS
  Filled 2021-07-24: qty 200

## 2021-07-24 MED ORDER — SODIUM CHLORIDE 0.9 % IV SOLN: Freq: Once | INTRAVENOUS | Status: AC

## 2021-07-24 NOTE — Progress Notes (Signed)
?    [facility] ?[phone]   [phone] ? ?OFFICE PROGRESS NOTE ? ?Revankar, Reita Cliche, MD ?[address] ?[place]  [place] [address] ? ?DIAGNOSIS: Metastatic non-small cell lung cancer initially diagnosed as stage IIIA (T3, N1/N2, M0) non-small cell lung cancer, adenocarcinoma presented with large right lower lobe lung mass with extension to the right hilum and subcarinal area diagnosed in July 2019.  She has brain metastasis in October 2020. ?  ?Biomarker Findings ?Tumor Mutational Burden - TMB-Intermediate (6 Muts/Mb) ?Microsatellite status - MS-Stable ?Genomic Findings ?For a complete list of the genes assayed, please refer to the Appendix. ?NRAS Q61R ?ARAF amplification ?STK11 G56W ?KRAS G13D ?MYCN amplification ?MCL1 amplification ?NKX2-1 amplification - equivocal? ?TP53 G245V ?7 Disease relevant genes with no reportable alterations: EGFR, ALK, ?BRAF, MET, ERBB2, RET, ROS1  ?  ?PRIOR THERAPY: ?1) Course of concurrent chemoradiation with weekly carboplatin for AUC of 2 and paclitaxel 45 mg/M2.  Status post 7 cycles.  Last dose was giving 01/11/2018. ?2) Consolidation treatment with immunotherapy with Imfinzi (Durvalumab) 10 mg/KG every 2 weeks.  First dose February 09, 2018.  Status post 19 cycles. ?3) status post stereotactic body radiotherapy to the enlarging right supraclavicular lymphadenopathy under the care of Dr. Lisbeth Renshaw. ?4) SRS to multiple brain metastasis under the care of Dr. Lisbeth Renshaw. ?  ?CURRENT THERAPY: Systemic chemotherapy with carboplatin for AUC of 5, Alimta 500 mg/M2 and Keytruda 200 mg IV every 3 weeks.  First dose 08/16/2019.  Status post 32 cycles.  Starting from cycle #5 the patient is on maintenance treatment with Alimta and Keytruda every 3 weeks. Alimta was reduced to 400 mg/m2 starting from cycle #21.  Starting from cycle #30 her treatment was changed to single agent Keytruda every 3 weeks.  Alimta was discontinued secondary to  toxicity. ? ?INTERVAL HISTORY: ?Susan Holder 66 y.o. female returns to the clinic today for follow-up visit.  The patient is feeling much better today with less fatigue.  She continues on home oxygen but she has oxygen saturation of 100% in the clinic today and 2 L nasal cannula.  The patient denied having any chest pain but has mild cough with no hemoptysis.  She has no nausea, vomiting, diarrhea or constipation.  She has no headache or visual changes.  She denied having any recent weight loss or night sweats.  She continues to tolerate her treatment with Keytruda fairly well.  The patient had repeat CT scan of the chest, abdomen pelvis performed yesterday and she is here for evaluation and discussion of her scan results before the next cycle of her treatment. ? ?MEDICAL HISTORY: ?Past Medical History:  ?Diagnosis Date  ? Acid reflux 09/21/2018  ? Adenocarcinoma of right lung, stage 4 ([place]) 11/17/2017  ? Allergic rhinitis 08/22/2013  ? ANA positive 05/29/2016  ? Aortic atherosclerosis ([place]) 10/23/2020  ? Brain metastases ([place]) 03/29/2019  ? COPD (chronic obstructive pulmonary disease) ([place])   ? Coronary artery calcification seen on CT scan 10/23/2020  ? DDD (degenerative disc disease), cervical 05/29/2016  ? Dyspnea   ? Encounter for antineoplastic chemotherapy 11/17/2017  ? Encounter for antineoplastic immunotherapy 02/01/2018  ? Goals of care, counseling/discussion 11/17/2017  ? High risk medication use 05/29/2016  ? 04/29/2016: ==> plq 200 am & 100 qhs(adeq response).  ? Hoarseness 03/16/2018  ? Left hand pain 03/06/2017  ? Lung mass   ? Malnutrition of moderate degree 02/09/2021  ? met lung ca dx'd 09/2017  ?  neck LN and brain 2020  ? 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[place]) 11/04/2018  ? Other fatigue 05/29/2016  ? Pericardial effusion 10/23/2020  ? Primary malignant neoplasm of bronchus of right lower lobe ([place]) 12/21/2017  ? Rheumatoid arthritis ([place])   ? Rheumatoid arthritis involving multiple  sites with positive rheumatoid factor ([place]) 05/29/2016  ? +RF +ANA +CCP   ? 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  ? Smoker 05/29/2016  ? Trigger finger, left ring finger 05/29/2016  ? Trigger finger, right ring finger 05/29/2016  ? Vitamin D deficiency 05/29/2016  ? ? ?ALLERGIES:  has No Known Allergies. ? ?MEDICATIONS:  ?Current Outpatient Medications  ?Medication Sig Dispense Refill  ? acetaminophen (TYLENOL) 325 MG tablet Take 2 tablets (650 mg total) by mouth every 6 (six) hours.    ? folic acid (FOLVITE) 1 MG tablet Take by mouth.    ? hydroxychloroquine (PLAQUENIL) 200 MG tablet TAKE 1 TABLET BY MOUTH TWICE DAILY MONDAY THROUGH FRIDAY ONLY. 120 tablet 0  ? omeprazole (PRILOSEC) 20 MG capsule Take 20 mg by mouth daily.    ? prochlorperazine (COMPAZINE) 10 MG tablet TAKE 1 TABLET(10 MG) BY MOUTH EVERY 6 HOURS AS NEEDED FOR NAUSEA OR VOMITING 30 tablet 2  ? vitamin C (ASCORBIC ACID) 500 MG tablet Take 500 mg by mouth daily.    ? VITAMIN D PO Take 1 tablet by mouth daily.    ? ?No current facility-administered medications for this visit.  ? ? ?SURGICAL HISTORY:  ?Past Surgical History:  ?Procedure Laterality Date  ? BRONCHIAL NEEDLE ASPIRATION BIOPSY  11/09/2017  ? Procedure: BRONCHIAL NEEDLE ASPIRATION BIOPSIES;  Surgeon: Marshell Garfinkel, MD;  Location: [facility];  Service: Cardiopulmonary;;  ? CHEST TUBE INSERTION Left 05/13/2021  ? Procedure: INSERTION PLEURAL DRAINAGE CATHETER;  Surgeon: Lajuana Matte, MD;  Location: [facility];  Service: Thoracic;  Laterality: Left;  ? ENDOBRONCHIAL ULTRASOUND Bilateral 11/09/2017  ? Procedure: ENDOBRONCHIAL ULTRASOUND;  Surgeon: Marshell Garfinkel, MD;  [facility]: [facility];  Service: Cardiopulmonary;  Laterality: Bilateral;  ? NASAL SINUS SURGERY    ? NASAL SINUS SURGERY    ? THORACENTESIS Right 02/07/2021  ? Procedure: THORACENTESIS;  Surgeon: Lajuana Matte, MD;  Location: [facility];  Service: Thoracic;  Laterality: Right;  ? TUBAL LIGATION    ? VIDEO  BRONCHOSCOPY  11/09/2017  ? Procedure: VIDEO BRONCHOSCOPY;  Surgeon: Marshell Garfinkel, MD;  [facility];  Service: Cardiopulmonary;;  ? XI ROBOTIC ASSISTED PERICARDIAL WINDOW Left 02/07/2021  ? Procedure: XI ROBOTIC ASSISTED THORACOSCOPY PERICARDIAL WINDOW;  Surgeon: Lajuana Matte, MD;  Location: [facility];  Service: Thoracic;  Laterality: Left;  ? ? ?REVIEW OF SYSTEMS:  Constitutional: positive for fatigue ?Eyes: negative ?Ears, nose, mouth, throat, and face: negative ?Respiratory: positive for cough and dyspnea on exertion ?Cardiovascular: negative ?Gastrointestinal: negative ?Genitourinary:negative ?Integument/breast: negative ?Hematologic/lymphatic: negative ?Musculoskeletal:positive for muscle weakness ?Neurological: negative ?Behavioral/Psych: negative ?Endocrine: negative ?Allergic/Immunologic: negative  ? ?PHYSICAL EXAMINATION: General appearance: alert, cooperative, fatigued, and no distress ?Head: Normocephalic, without obvious abnormality, atraumatic ?Neck: no adenopathy, no JVD, supple, symmetrical, trachea midline, and thyroid not enlarged, symmetric, no tenderness/mass/nodules ?Lymph nodes: Cervical, supraclavicular, and axillary nodes normal. ?Resp: clear to auscultation bilaterally ?Back: symmetric, no curvature. ROM normal. No CVA tenderness. ?Cardio: regular rate and rhythm, S1, S2 normal, no murmur, click, rub or gallop ?GI: soft, non-tender; bowel sounds normal; no masses,  no organomegaly ?Extremities: extremities normal, atraumatic, no cyanosis or edema ?Neurologic: Alert and oriented X 3, normal strength and tone. Normal symmetric reflexes.  Normal coordination and gait ? ?ECOG PERFORMANCE STATUS: 1 - Symptomatic but completely ambulatory ? ?Blood pressure 120/61, pulse (!) 104, temperature (!) 97.4 ?F (36.3 ?C), temperature source Tympanic, resp. rate 16, height 5' (1.524 m), weight 105 lb 8 oz (47.9 kg), last menstrual period 04/28/2000, SpO2 100 %. ? ?LABORATORY DATA: ?Lab  Results  ?Component Value Date  ? WBC 7.3 07/24/2021  ? HGB 10.3 (L) 07/24/2021  ? HCT 32.9 (L) 07/24/2021  ? MCV 102.8 (H) 07/24/2021  ? PLT 326 07/24/2021  ? ? ?  Chemistry   ?   ?Component Value Date/Time  ? NA 137 07/03/2021 0953  ?

## 2021-07-24 NOTE — Progress Notes (Signed)
Nutrition Follow-up: ? ?Patient is currently receiving maintenance Keytruda for metastatic lung cancer. S/p Pleurx catheter with Dr. Kipp Brood 05/13/21.  ?  ?Met with patient during infusion. She reports doing well. Patient endorses good appetite and is eating 2-3 meals day. Patient is drinking one Ensure Plus. She denies nausea, vomiting, diarrhea, constipation.  ? ?Medications: reviewed  ? ?Labs: Cr 1.31, Ca 10.9 ? ?Anthropometrics: Weight 105 lb 8 oz today stable  ? ?3/8 - 105 lb ?2/15 - 103 lb 1.6 oz  ? ? ?NUTRITION DIAGNOSIS: Unintended weight loss stable  ? ? ? ?INTERVENTION:  ?Encouraged high calorie high protein foods for weight maintenance ?Continue drinking 1-2 Ensure Plus/equivalent daily ?Patient has contact information  ?  ? ?MONITORING, EVALUATION, GOAL: weight trends, intake ? ? ?NEXT VISIT: To be scheduled as needed with treatment  ? ? ? ?

## 2021-07-24 NOTE — Patient Instructions (Signed)
[facility]  Discharge Instructions: ?Thank you for choosing [facility] to provide your oncology and hematology care.  ? ?If you have a lab appointment with the Cancer Center, please go directly to the Cancer Center and check in at the registration area. ?  ?Wear comfortable clothing and clothing appropriate for easy access to any Portacath or PICC line.  ? ?We strive to give you quality time with your provider. You may need to reschedule your appointment if you arrive late (15 or more minutes).  Arriving late affects you and other patients whose appointments are after yours.  Also, if you miss three or more appointments without notifying the office, you may be dismissed from the clinic at the provider?s discretion.    ?  ?For prescription refill requests, have your pharmacy contact our office and allow 72 hours for refills to be completed.   ? ?Today you received the following chemotherapy and/or immunotherapy agents: Keytruda ?  ?To help prevent nausea and vomiting after your treatment, we encourage you to take your nausea medication as directed. ? ?BELOW ARE SYMPTOMS THAT SHOULD BE REPORTED IMMEDIATELY: ?*FEVER GREATER THAN 100.4 F (38 ?C) OR HIGHER ?*CHILLS OR SWEATING ?*NAUSEA AND VOMITING THAT IS NOT CONTROLLED WITH YOUR NAUSEA MEDICATION ?*UNUSUAL SHORTNESS OF BREATH ?*UNUSUAL BRUISING OR BLEEDING ?*URINARY PROBLEMS (pain or burning when urinating, or frequent urination) ?*BOWEL PROBLEMS (unusual diarrhea, constipation, pain near the anus) ?TENDERNESS IN MOUTH AND THROAT WITH OR WITHOUT PRESENCE OF ULCERS (sore throat, sores in mouth, or a toothache) ?UNUSUAL RASH, SWELLING OR PAIN  ?UNUSUAL VAGINAL DISCHARGE OR ITCHING  ? ?Items with * indicate a potential emergency and should be followed up as soon as possible or go to the Emergency Department if any problems should occur. ? ?Please show the CHEMOTHERAPY ALERT CARD or IMMUNOTHERAPY ALERT CARD at check-in to the  Emergency Department and triage nurse. ? ?Should you have questions after your visit or need to cancel or reschedule your appointment, please contact [facility]  Dept: [phone]  and follow the prompts.  Office hours are 8:00 a.m. to 4:30 p.m. Monday - Friday. Please note that voicemails left after 4:00 p.m. may not be returned until the following business day.  We are closed weekends and major holidays. You have access to a nurse at all times for urgent questions. Please call the main number to the clinic Dept: [phone] and follow the prompts. ? ? ?For any non-urgent questions, you may also contact your provider using MyChart. We now offer e-Visits for anyone 18 and older to request care online for non-urgent symptoms. For details visit [email or web address]. ?  ?Also download the MyChart app! Go to the app store, search "MyChart", open the app, select [facility], and log in with your MyChart username and password. ? ?Due to Covid, a mask is required upon entering the hospital/clinic. If you do not have a mask, one will be given to you upon arrival. For doctor visits, patients may have 1 support person aged 18 or older with them. For treatment visits, patients cannot have anyone with them due to current Covid guidelines and our immunocompromised population.  ? ?

## 2021-08-06 DIAGNOSIS — C7800 Secondary malignant neoplasm of unspecified lung: Secondary | ICD-10-CM | POA: Diagnosis not present

## 2021-08-06 DIAGNOSIS — J90 Pleural effusion, not elsewhere classified: Secondary | ICD-10-CM | POA: Diagnosis not present

## 2021-08-13 NOTE — Progress Notes (Signed)
[facility] ?OFFICE PROGRESS NOTE ? ?Revankar, Reita Cliche, MD ?[address] ?[place]  [place] [address] ? ?DIAGNOSIS: Metastatic non-small cell lung cancer initially diagnosed as stage IIIA (T3, N1/N2, M0) non-small cell lung cancer, adenocarcinoma presented with large right lower lobe lung mass with extension to the right hilum and subcarinal area diagnosed in July 2019.  She has brain metastasis in October 2020. ?  ?Biomarker Findings ?Tumor Mutational Burden - TMB-Intermediate (6 Muts/Mb) ?Microsatellite status - MS-Stable ?Genomic Findings ?For a complete list of the genes assayed, please refer to the Appendix. ?NRAS Q61R ?ARAF amplification ?STK11 G56W ?KRAS G13D ?MYCN amplification ?MCL1 amplification ?NKX2-1 amplification - equivocal? ?TP53 G245V ?7 Disease relevant genes with no reportable alterations: EGFR, ALK, ?BRAF, MET, ERBB2, RET, ROS1  ? ?PRIOR THERAPY:  ?1) Course of concurrent chemoradiation with weekly carboplatin for AUC of 2 and paclitaxel 45 mg/M2.  Status post 7 cycles.  Last dose was giving 01/11/2018. ?2) Consolidation treatment with immunotherapy with Imfinzi (Durvalumab) 10 mg/KG every 2 weeks.  First dose February 09, 2018.  Status post 19 cycles. ?3) status post stereotactic body radiotherapy to the enlarging right supraclavicular lymphadenopathy under the care of Dr. Lisbeth Renshaw. ?4) SRS to multiple brain metastasis under the care of Dr. Lisbeth Renshaw. ? ?CURRENT THERAPY: Systemic chemotherapy with carboplatin for AUC of 5, Alimta 500 mg/M2 and Keytruda 200 mg IV every 3 weeks.  First dose 08/16/2019.  Status post 33 cycles.  Starting from cycle #5 the patient is on maintenance treatment with Alimta and Keytruda every 3 weeks. Alimta was reduced to 400 mg/m2 starting from cycle #21. Alimta removed from the treatment plan starting from cycle #30 due to cytopenias ? ?INTERVAL HISTORY: ?Susan Holder 66 y.o. female returns to the clinic today for a follow-up visit.  Over the past  several months, the patient is having progressive symptoms of fatigue, weakness, and weight loss. She also was having cytopenias. Therefore, she is now on single agent Keytruda. Her Alimta has been removed from her treatment plan. She seems to have better tolerance/energy since discontinuing Alimta. She is currently on supplemental oxygen via nasal cannula.  She also follows with Dr. Kipp Brood due to her Pleurx catheter and history of pericardial effusion. Her Pleurex is draining ~368m per week 1x per week. She is expected to see him on Friday for consideration of pleurex removal and to review a CT scan of the chest, which is scheduled for Friday.  She is also followed closely by member the nutritionist team for her weight loss. The patient drinks Ensure once-twice a day due to her weight loss.   ? ?Today, she notes that she is having a good day from an energy perspective. She denies any fever, chills, or night sweats.  She denies any sick contacts.  She denies any chest pain or hemoptysis.  She has a baseline chronic dry cough.  She denies any changes with her baseline dyspnea on exertion which depends on the activity she is doing. Denies any nausea, vomiting, diarrhea, or constipation.  Denies any headache or visual changes. She is going to have a repeat brain MRI next month on 08/30/21.  Denies any rashes or skin changes.  She is here today for evaluation before considering starting cycle #34 ? ? ?MEDICAL HISTORY: ?Past Medical History:  ?Diagnosis Date  ? Acid reflux 09/21/2018  ? Adenocarcinoma of right lung, stage 4 ([place] 11/17/2017  ? Allergic rhinitis 08/22/2013  ? ANA positive 05/29/2016  ?  Aortic atherosclerosis ([place]) 10/23/2020  ? Brain metastases ([place]) 03/29/2019  ? COPD (chronic obstructive pulmonary disease) ([place])   ? Coronary artery calcification seen on CT scan 10/23/2020  ? DDD (degenerative disc disease), cervical 05/29/2016  ? Dyspnea   ? Encounter for antineoplastic chemotherapy 11/17/2017  ?  Encounter for antineoplastic immunotherapy 02/01/2018  ? Goals of care, counseling/discussion 11/17/2017  ? High risk medication use 05/29/2016  ? 04/29/2016: ==> plq 200 am & 100 qhs(adeq response).  ? Hoarseness 03/16/2018  ? Left hand pain 03/06/2017  ? Lung mass   ? Malnutrition of moderate degree 02/09/2021  ? met lung ca dx'd 09/2017  ? neck LN and brain 2020  ? 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[place]) 11/04/2018  ? Other fatigue 05/29/2016  ? Pericardial effusion 10/23/2020  ? Primary malignant neoplasm of bronchus of right lower lobe ([place]) 12/21/2017  ? Rheumatoid arthritis ([place])   ? Rheumatoid arthritis involving multiple sites with positive rheumatoid factor ([place]) 05/29/2016  ? +RF +ANA +CCP   ? 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  ? Smoker 05/29/2016  ? Trigger finger, left ring finger 05/29/2016  ? Trigger finger, right ring finger 05/29/2016  ? Vitamin D deficiency 05/29/2016  ? ? ?ALLERGIES:  has No Known Allergies. ? ?MEDICATIONS:  ?Current Outpatient Medications  ?Medication Sig Dispense Refill  ? acetaminophen (TYLENOL) 325 MG tablet Take 2 tablets (650 mg total) by mouth every 6 (six) hours.    ? folic acid (FOLVITE) 1 MG tablet Take by mouth.    ? hydroxychloroquine (PLAQUENIL) 200 MG tablet TAKE 1 TABLET BY MOUTH TWICE DAILY MONDAY THROUGH FRIDAY ONLY. 120 tablet 0  ? omeprazole (PRILOSEC) 20 MG capsule Take 20 mg by mouth daily.    ? prochlorperazine (COMPAZINE) 10 MG tablet TAKE 1 TABLET(10 MG) BY MOUTH EVERY 6 HOURS AS NEEDED FOR NAUSEA OR VOMITING 30 tablet 2  ? vitamin C (ASCORBIC ACID) 500 MG tablet Take 500 mg by mouth daily.    ? VITAMIN D PO Take 1 tablet by mouth daily.    ? ?No current facility-administered medications for this visit.  ? ? ?SURGICAL HISTORY:  ?Past Surgical History:  ?Procedure Laterality Date  ? BRONCHIAL NEEDLE ASPIRATION BIOPSY  11/09/2017  ? Procedure: BRONCHIAL NEEDLE ASPIRATION BIOPSIES;  Surgeon: Marshell Garfinkel, MD;  [facility];  Service:  Cardiopulmonary;;  ? CHEST TUBE INSERTION Left 05/13/2021  ? Procedure: INSERTION PLEURAL DRAINAGE CATHETER;  Surgeon: Lajuana Matte, MD;  Location: [facility];  Service: Thoracic;  Laterality: Left;  ? ENDOBRONCHIAL ULTRASOUND Bilateral 11/09/2017  ? Procedure: ENDOBRONCHIAL ULTRASOUND;  Surgeon: Marshell Garfinkel, MD;  [facility];  Service: Cardiopulmonary;  Laterality: Bilateral;  ? NASAL SINUS SURGERY    ? NASAL SINUS SURGERY    ? THORACENTESIS Right 02/07/2021  ? Procedure: THORACENTESIS;  Surgeon: Lajuana Matte, MD;  Location: [facility];  Service: Thoracic;  Laterality: Right;  ? TUBAL LIGATION    ? VIDEO BRONCHOSCOPY  11/09/2017  ? Procedure: VIDEO BRONCHOSCOPY;  Surgeon: Marshell Garfinkel, MD;  [facility];  Service: Cardiopulmonary;;  ? XI ROBOTIC ASSISTED PERICARDIAL WINDOW Left 02/07/2021  ? Procedure: XI ROBOTIC ASSISTED THORACOSCOPY PERICARDIAL WINDOW;  Surgeon: Lajuana Matte, MD;  Location: [facility];  Service: Thoracic;  Laterality: Left;  ? ? ?REVIEW OF SYSTEMS:   ?Review of Systems  ?Constitutional: Positive for fatigue and weakness (improved compared to prior). Negative for chills and fever.  ?HENT: Negative for mouth sores, nosebleeds, sore throat and trouble swallowing.   ?  Eyes: Negative for eye problems and icterus.  ?Respiratory: Positive for stable shortness of breath.  Positive for chronic cough.  Negative for hemoptysis and wheezing.   ?Cardiovascular: Negative for chest pain and leg swelling.  ?Gastrointestinal: Negative for abdominal pain, constipation, diarrhea, nausea and vomiting.  ?Genitourinary: Negative for bladder incontinence, difficulty urinating, dysuria, frequency and hematuria.   ?Musculoskeletal: Negative for back pain, gait problem, neck pain and neck stiffness.  ?Skin: Negative for itching and rash.  ?Neurological: Negative for dizziness, extremity weakness, gait problem, headaches, light-headedness and seizures.  ?Hematological: Negative for  adenopathy. Does not bruise/bleed easily.  ?Psychiatric/Behavioral: Negative for confusion, depression and sleep disturbance. The patient is not nervous/anxious.     ? ? ?PHYSICAL EXAMINATION:  ?Blood pressure 1

## 2021-08-14 ENCOUNTER — Inpatient Hospital Stay: Payer: Medicare PPO | Admitting: Physician Assistant

## 2021-08-14 ENCOUNTER — Inpatient Hospital Stay: Payer: Medicare PPO | Attending: Internal Medicine

## 2021-08-14 ENCOUNTER — Inpatient Hospital Stay: Payer: Medicare PPO

## 2021-08-14 ENCOUNTER — Other Ambulatory Visit: Payer: Self-pay

## 2021-08-14 VITALS — BP 121/70 | HR 108 | Temp 97.8°F | Resp 17 | Wt 105.1 lb

## 2021-08-14 DIAGNOSIS — Z79899 Other long term (current) drug therapy: Secondary | ICD-10-CM | POA: Diagnosis not present

## 2021-08-14 DIAGNOSIS — Z5112 Encounter for antineoplastic immunotherapy: Secondary | ICD-10-CM | POA: Insufficient documentation

## 2021-08-14 DIAGNOSIS — C7931 Secondary malignant neoplasm of brain: Secondary | ICD-10-CM | POA: Diagnosis not present

## 2021-08-14 DIAGNOSIS — C3491 Malignant neoplasm of unspecified part of right bronchus or lung: Secondary | ICD-10-CM

## 2021-08-14 DIAGNOSIS — C3431 Malignant neoplasm of lower lobe, right bronchus or lung: Secondary | ICD-10-CM

## 2021-08-14 DIAGNOSIS — Z87891 Personal history of nicotine dependence: Secondary | ICD-10-CM | POA: Diagnosis not present

## 2021-08-14 LAB — CMP (CANCER CENTER ONLY)
ALT: 6 U/L (ref 0–44)
AST: 18 U/L (ref 15–41)
Albumin: 2.7 g/dL — ABNORMAL LOW (ref 3.5–5.0)
Alkaline Phosphatase: 71 U/L (ref 38–126)
Anion gap: 5 (ref 5–15)
BUN: 16 mg/dL (ref 8–23)
CO2: 31 mmol/L (ref 22–32)
Calcium: 10.4 mg/dL — ABNORMAL HIGH (ref 8.9–10.3)
Chloride: 104 mmol/L (ref 98–111)
Creatinine: 1.38 mg/dL — ABNORMAL HIGH (ref 0.44–1.00)
GFR, Estimated: 42 mL/min — ABNORMAL LOW (ref >60)
Glucose, Bld: 114 mg/dL — ABNORMAL HIGH (ref 70–99)
Potassium: 3.6 mmol/L (ref 3.5–5.1)
Sodium: 140 mmol/L (ref 135–145)
Total Bilirubin: 0.3 mg/dL (ref 0.3–1.2)
Total Protein: 7.4 g/dL (ref 6.5–8.1)

## 2021-08-14 LAB — CBC WITH DIFFERENTIAL (CANCER CENTER ONLY)
Abs Immature Granulocytes: 0.02 10*3/uL (ref 0.00–0.07)
Basophils Absolute: 0 10*3/uL (ref 0.0–0.1)
Basophils Relative: 0 %
Eosinophils Absolute: 0.2 10*3/uL (ref 0.0–0.5)
Eosinophils Relative: 3 %
HCT: 30.7 % — ABNORMAL LOW (ref 36.0–46.0)
Hemoglobin: 9.6 g/dL — ABNORMAL LOW (ref 12.0–15.0)
Immature Granulocytes: 0 %
Lymphocytes Relative: 14 %
Lymphs Abs: 0.9 10*3/uL (ref 0.7–4.0)
MCH: 31 pg (ref 26.0–34.0)
MCHC: 31.3 g/dL (ref 30.0–36.0)
MCV: 99 fL (ref 80.0–100.0)
Monocytes Absolute: 0.9 10*3/uL (ref 0.1–1.0)
Monocytes Relative: 14 %
Neutro Abs: 4.8 10*3/uL (ref 1.7–7.7)
Neutrophils Relative %: 69 %
Platelet Count: 333 10*3/uL (ref 150–400)
RBC: 3.1 MIL/uL — ABNORMAL LOW (ref 3.87–5.11)
RDW: 12.6 % (ref 11.5–15.5)
WBC Count: 6.8 10*3/uL (ref 4.0–10.5)
nRBC: 0 % (ref 0.0–0.2)

## 2021-08-14 LAB — TSH: TSH: 3.285 u[IU]/mL (ref 0.350–4.500)

## 2021-08-14 MED ORDER — SODIUM CHLORIDE 0.9 % IV SOLN: Freq: Once | INTRAVENOUS | Status: AC

## 2021-08-14 MED ORDER — SODIUM CHLORIDE 0.9 % IV SOLN
200.0000 mg | Freq: Once | INTRAVENOUS | Status: AC
  Administered 2021-08-14: 200 mg via INTRAVENOUS
  Filled 2021-08-14: qty 200

## 2021-08-14 NOTE — Patient Instructions (Signed)
[facility]  Discharge Instructions: ?Thank you for choosing [facility] to provide your oncology and hematology care.  ? ?If you have a lab appointment with the Cancer Center, please go directly to the Cancer Center and check in at the registration area. ?  ?Wear comfortable clothing and clothing appropriate for easy access to any Portacath or PICC line.  ? ?We strive to give you quality time with your provider. You may need to reschedule your appointment if you arrive late (15 or more minutes).  Arriving late affects you and other patients whose appointments are after yours.  Also, if you miss three or more appointments without notifying the office, you may be dismissed from the clinic at the provider?s discretion.    ?  ?For prescription refill requests, have your pharmacy contact our office and allow 72 hours for refills to be completed.   ? ?Today you received the following chemotherapy and/or immunotherapy agents: Keytruda ?  ?To help prevent nausea and vomiting after your treatment, we encourage you to take your nausea medication as directed. ? ?BELOW ARE SYMPTOMS THAT SHOULD BE REPORTED IMMEDIATELY: ?*FEVER GREATER THAN 100.4 F (38 ?C) OR HIGHER ?*CHILLS OR SWEATING ?*NAUSEA AND VOMITING THAT IS NOT CONTROLLED WITH YOUR NAUSEA MEDICATION ?*UNUSUAL SHORTNESS OF BREATH ?*UNUSUAL BRUISING OR BLEEDING ?*URINARY PROBLEMS (pain or burning when urinating, or frequent urination) ?*BOWEL PROBLEMS (unusual diarrhea, constipation, pain near the anus) ?TENDERNESS IN MOUTH AND THROAT WITH OR WITHOUT PRESENCE OF ULCERS (sore throat, sores in mouth, or a toothache) ?UNUSUAL RASH, SWELLING OR PAIN  ?UNUSUAL VAGINAL DISCHARGE OR ITCHING  ? ?Items with * indicate a potential emergency and should be followed up as soon as possible or go to the Emergency Department if any problems should occur. ? ?Please show the CHEMOTHERAPY ALERT CARD or IMMUNOTHERAPY ALERT CARD at check-in to the  Emergency Department and triage nurse. ? ?Should you have questions after your visit or need to cancel or reschedule your appointment, please contact [facility]  Dept: [phone]  and follow the prompts.  Office hours are 8:00 a.m. to 4:30 p.m. Monday - Friday. Please note that voicemails left after 4:00 p.m. may not be returned until the following business day.  We are closed weekends and major holidays. You have access to a nurse at all times for urgent questions. Please call the main number to the clinic Dept: [phone] and follow the prompts. ? ? ?For any non-urgent questions, you may also contact your provider using MyChart. We now offer e-Visits for anyone 18 and older to request care online for non-urgent symptoms. For details visit [email or web address]. ?  ?Also download the MyChart app! Go to the app store, search "MyChart", open the app, select , and log in with your MyChart username and password. ? ?Due to Covid, a mask is required upon entering the hospital/clinic. If you do not have a mask, one will be given to you upon arrival. For doctor visits, patients may have 1 support person aged 18 or older with them. For treatment visits, patients cannot have anyone with them due to current Covid guidelines and our immunocompromised population.  ? ?

## 2021-08-14 NOTE — Progress Notes (Signed)
Ok to treat with elevated HR per Cassie Heilengoetter, PA  ?

## 2021-08-16 ENCOUNTER — Ambulatory Visit
Admission: RE | Admit: 2021-08-16 | Discharge: 2021-08-16 | Disposition: A | Discharge status: Home / Self Care | Payer: Medicare PPO | Source: Ambulatory Visit | Attending: Thoracic Surgery (Cardiothoracic Vascular Surgery) | Admitting: Thoracic Surgery (Cardiothoracic Vascular Surgery)

## 2021-08-16 ENCOUNTER — Ambulatory Visit: Payer: Medicare PPO | Admitting: Thoracic Surgery (Cardiothoracic Vascular Surgery)

## 2021-08-16 VITALS — BP 140/76 | HR 109 | Resp 20 | Ht 60.0 in | Wt 106.0 lb

## 2021-08-16 DIAGNOSIS — Z85110 Personal history of malignant carcinoid tumor of bronchus and lung: Secondary | ICD-10-CM | POA: Diagnosis not present

## 2021-08-16 DIAGNOSIS — Z938 Other artificial opening status: Secondary | ICD-10-CM

## 2021-08-16 DIAGNOSIS — J9811 Atelectasis: Secondary | ICD-10-CM | POA: Diagnosis not present

## 2021-08-16 DIAGNOSIS — C3491 Malignant neoplasm of unspecified part of right bronchus or lung: Secondary | ICD-10-CM

## 2021-08-16 DIAGNOSIS — J90 Pleural effusion, not elsewhere classified: Secondary | ICD-10-CM | POA: Diagnosis not present

## 2021-08-16 DIAGNOSIS — I700 Atherosclerosis of aorta: Secondary | ICD-10-CM | POA: Diagnosis not present

## 2021-08-16 NOTE — Progress Notes (Signed)
? ?   ?  [address] ?      [place] [address] ?            [phone]       ? ?Susan Holder ?[place] Record #[id number] ?Date of Birth: 1955-05-11 ? ?Referring: RevankarReita Cliche, MD ?Primary Care: Jenean Lindau, MD ?Primary Cardiologist:None ? ?Reason for visit:   follow-up ? ?History of Present Illness:     ?Ms. Rubiano comes in for scheduled follow-up appointment.  She underwent a Pleurx catheter placements for chronic left effusion.  She has been doing weekly drainages with outputs around 200 to 300 mL each time.  She did undergo a CT scan today. ? ?Physical Exam: ?BP 140/76 (BP Location: Left Arm, Patient Position: Sitting)   Pulse (!) 109   Resp 20   Ht 5' (1.524 m)   Wt 106 lb (48.1 kg)   LMP 04/28/2000   SpO2 98% Comment: 2L O2  BMI 20.70 kg/m?  ? ?Alert NAD ?Abdomen, ND ?No peripheral edema ? ? ?Diagnostic Studies & Laboratory data: ?CT chest: She does have a residual left-sided pleural effusion but the drain is in good position. ? ?  ? ?Assessment / Plan:   ?66 year old female with history of stage IV lung cancer, and pericardial effusion.  She previously underwent a pericardial window via a left VATS.  She continues to have moderate output and there is residual fluid collection with good drain placement evident on CT scan.  I have recommended that she wait 2 weeks to drain her effusion.  If it is the same at less than 300 mL then we will Pleurx catheter. ? ? ?Susan Holder ?08/16/2021 3:21 PM ? ? ? ? ? ? ?

## 2021-08-27 ENCOUNTER — Telehealth: Payer: Self-pay

## 2021-08-27 NOTE — Telephone Encounter (Signed)
Patient contacted the office concerned about the color of her recent PleurX drainage. She states that Dr. Kipp Brood advised her to wait 2 weeks to drain her PleurX, which she did. She drains and the pleural fluid is blood tinged. 300 mls drained. Patient states that she has no new symptoms and frank blood was not seen in the drainage system. Advised that this is expected. She acknowledged receipt and is aware of her telephone visit with Dr. Kipp Brood this Friday. ?

## 2021-08-30 ENCOUNTER — Encounter: Payer: Self-pay | Admitting: Radiation Oncology

## 2021-08-30 ENCOUNTER — Ambulatory Visit (INDEPENDENT_AMBULATORY_CARE_PROVIDER_SITE_OTHER): Payer: Medicare PPO | Admitting: Thoracic Surgery (Cardiothoracic Vascular Surgery)

## 2021-08-30 ENCOUNTER — Ambulatory Visit
Admission: RE | Admit: 2021-08-30 | Discharge: 2021-08-30 | Disposition: A | Discharge status: Home / Self Care | Payer: Medicare PPO | Source: Ambulatory Visit | Attending: Radiation Oncology | Admitting: Radiation Oncology

## 2021-08-30 DIAGNOSIS — Z938 Other artificial opening status: Secondary | ICD-10-CM | POA: Diagnosis not present

## 2021-08-30 DIAGNOSIS — C7931 Secondary malignant neoplasm of brain: Secondary | ICD-10-CM | POA: Diagnosis not present

## 2021-08-30 DIAGNOSIS — G9389 Other specified disorders of brain: Secondary | ICD-10-CM | POA: Diagnosis not present

## 2021-08-30 MED ORDER — GADOBENATE DIMEGLUMINE 529 MG/ML IV SOLN: 9.0000 mL | Freq: Once | INTRAVENOUS | Status: DC | PRN

## 2021-08-30 MED ORDER — GADOBENATE DIMEGLUMINE 529 MG/ML IV SOLN
6.0000 mL | Freq: Once | INTRAVENOUS | Status: AC | PRN
  Administered 2021-08-30: 6 mL via INTRAVENOUS

## 2021-08-30 NOTE — Progress Notes (Signed)
?   ?  [address] ?      [place] [address] ?            [phone]      ? ?Patient: Home ?Provider: Office ?Consent for Telemedicine visit obtained. ? ?Today?s visit was completed via a real-time telehealth (see specific modality noted below). The patient/authorized person provided oral consent at the time of the visit to engage in a telemedicine encounter with the present provider at [facility]. The patient/authorized person was informed of the potential benefits, limitations, and risks of telemedicine. The patient/authorized person expressed understanding that the laws that protect confidentiality also apply to telemedicine. The patient/authorized person acknowledged understanding that telemedicine does not provide emergency services and that he or she would need to call 911 or proceed to the nearest hospital for help if such a need arose. ? ? Total time spent in the clinical discussion 10 minutes. ? Telehealth Modality: Phone visit (audio only) ? ?I had a telephone visit with Susan Holder.  She drained 300-331mls after 2 week interval.  She will come to clinic next week for pleurx removal. ? ?Susan Holder ? ? ?

## 2021-08-30 NOTE — Progress Notes (Signed)
Telephone appointment. I verified patient identity and began nursing interview. Patient reports constant, mild shortness of breath. No other issues reported at this time. ? ?Meaningful use complete. ?Postmenopausal- NO chances of pregnancy. ? ?Reminded patient of her 3:00pm-09/02/21 telephone appointment w/ Shona Simpson PA-C. I left my extension [phone] in case patient needs anything. Patient verbalized understanding of the conversation. ? ?Patient contact [phone] ?

## 2021-09-02 ENCOUNTER — Ambulatory Visit
Admission: RE | Admit: 2021-09-02 | Discharge: 2021-09-02 | Disposition: A | Discharge status: Home / Self Care | Payer: Medicare PPO | Source: Ambulatory Visit | Attending: Radiation Oncology | Admitting: Radiation Oncology

## 2021-09-02 ENCOUNTER — Inpatient Hospital Stay: Payer: Medicare PPO | Attending: Internal Medicine

## 2021-09-02 DIAGNOSIS — R5383 Other fatigue: Secondary | ICD-10-CM | POA: Insufficient documentation

## 2021-09-02 DIAGNOSIS — C3431 Malignant neoplasm of lower lobe, right bronchus or lung: Secondary | ICD-10-CM | POA: Diagnosis not present

## 2021-09-02 DIAGNOSIS — J90 Pleural effusion, not elsewhere classified: Secondary | ICD-10-CM | POA: Insufficient documentation

## 2021-09-02 DIAGNOSIS — Z5112 Encounter for antineoplastic immunotherapy: Secondary | ICD-10-CM | POA: Insufficient documentation

## 2021-09-02 DIAGNOSIS — C7931 Secondary malignant neoplasm of brain: Secondary | ICD-10-CM | POA: Diagnosis not present

## 2021-09-02 DIAGNOSIS — Z923 Personal history of irradiation: Secondary | ICD-10-CM | POA: Insufficient documentation

## 2021-09-02 DIAGNOSIS — R000 Tachycardia, unspecified: Secondary | ICD-10-CM | POA: Insufficient documentation

## 2021-09-02 DIAGNOSIS — D649 Anemia, unspecified: Secondary | ICD-10-CM | POA: Insufficient documentation

## 2021-09-02 NOTE — Progress Notes (Signed)
Radiation Oncology         [phone] ________________________________  Outpatient Follow Up - Conducted via telephone due to current COVID-19 concerns for limiting patient exposure  I spoke with the patient to conduct this consult visit via telephone to spare the patient unnecessary potential exposure in the healthcare setting during the current COVID-19 pandemic. The patient was notified in advance and was offered a WebEX meeting to allow for face to face communication but unfortunately reported that they did not have the appropriate resources/technology to support such a visit and instead preferred to proceed with a telephone visit.   ________________________________  Name: GRICELDA BADUA MRN: [id number]  Date of Service: 09/02/2021  DOB: Oct 12, 1955  Post Treatment Telephone Note  DIAGNOSIS:   Progressive Metastatic Stage IIIA, cT3N1-2M0 NSCLC, adenocarcinoma of the right lower lobe with brain metastases  INTERVAL SINCE LAST RADIOTHERAPY:    03/09/2019 SRS Treatment: Each site was treated to 20 Gy in a single fraction PTV1 Post Cerebellum 12mm PTV2 Lt Cerebellum 13mm PTV3 Lo Rt Vent 8mm PTV4 Rt Frontal 5mm PTV5 Lt Frontal Operc 5mm PTV6 Lt Sylv 5mm  11/16/2018-12/20/2018: The patient's right supraclavicular nodes were treated to 60 Gy over 25 fractions.   11/30/17-01/11/18:  66 Gy to the right chest and regional nodes over 33 fractions  NARRATIVE: Ms. Zaluski is a well-known 66 y.o.  patient to our service with a history of adenocarcinoma of the right lower lobe.  When she was originally diagnosed in the summer 2019 she presented with local regional disease and received chemoradiation.  Unfortunately she recurred in the summer 2020, she did receive palliative radiotherapy to the right supraclavicular node, and in November 2020 was found to have 6 lesions in the brain felt to be metastatic for which she underwent SRS treatment in November 2020.  She received systemic chemotherapy  as well as immunotherapy, recently she has been on immunotherapy as the chemotherapy was discontinued due to cytopenia.  She also has had pleural effusions that have been sent for cytology on 2 occasions and were negative for malignancy but have persisted and she did undergo a pericardial window on 02/07/2021 and Pleurx catheter placement on 05/13/2021.  She is hopeful to have her Pleurx removed this week on Friday.  She went for routine surveillance MRI of the brain on Friday of last week, 08/30/2021, stability in the treated lesions in the brain were again seen with one of the lesions showing only minimal residual enhancement in the left inferior frontal lobe, last interval scan it measured 2 mm.  She is contacted by phone to review these results.  She is scheduled to see Dr. Arbutus Ped on Wednesday as well.   On review of systems, the patient reports that she is doing okay. She reports she had a bad experience and was stuck 5 times at [facility] last week while trying to have her MRI.  She reports that since having her pleural effusions drained, she has noticed improvement in fatigue and while she cannot say if this was the sole source of her fatigue, she is glad to feel better.  She does not feel like this was truly just chemotherapy however.  No other complaints are verbalized.  PAST MEDICAL HISTORY:  Past Medical History:  Diagnosis Date   Acid reflux 09/21/2018   Adenocarcinoma of right lung, stage 4 (HCC) 11/17/2017   Allergic rhinitis 08/22/2013   ANA positive 05/29/2016   Aortic atherosclerosis (HCC) 10/23/2020   Brain metastases 03/29/2019   COPD (  chronic obstructive pulmonary disease) (HCC)    Coronary artery calcification seen on CT scan 10/23/2020   DDD (degenerative disc disease), cervical 05/29/2016   Dyspnea    Encounter for antineoplastic chemotherapy 11/17/2017   Encounter for antineoplastic immunotherapy 02/01/2018   Goals of care, counseling/discussion 11/17/2017   High  risk medication use 05/29/2016   04/29/2016: ==> plq 200 am & 100 qhs(adeq response).   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) 11/04/2018   Other fatigue 05/29/2016   Pericardial effusion 10/23/2020   Primary malignant neoplasm of bronchus of right lower lobe (HCC) 12/21/2017   Rheumatoid arthritis (HCC)    Rheumatoid arthritis involving multiple sites with positive rheumatoid factor (HCC) 05/29/2016   +RF +ANA +CCP    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   Smoker 05/29/2016   Trigger finger, left ring finger 05/29/2016   Trigger finger, right ring finger 05/29/2016   Vitamin D deficiency 05/29/2016    PAST SURGICAL HISTORY: Past Surgical History:  Procedure Laterality Date   BRONCHIAL NEEDLE ASPIRATION BIOPSY  11/09/2017   Procedure: BRONCHIAL NEEDLE ASPIRATION BIOPSIES;  Surgeon: Chilton Greathouse, MD;  Location: [facility];  Service: Cardiopulmonary;;   CHEST TUBE INSERTION Left 05/13/2021   Procedure: INSERTION PLEURAL DRAINAGE CATHETER;  Surgeon: Corliss Skains, MD;  Location: [facility];  Service: Thoracic;  Laterality: Left;   ENDOBRONCHIAL ULTRASOUND Bilateral 11/09/2017   Procedure: ENDOBRONCHIAL ULTRASOUND;  Surgeon: Chilton Greathouse, MD;  Location: [facility];  Service: Cardiopulmonary;  Laterality: Bilateral;   NASAL SINUS SURGERY     NASAL SINUS SURGERY     THORACENTESIS Right 02/07/2021   Procedure: THORACENTESIS;  Surgeon: Corliss Skains, MD;  Location: [facility] OR;  Service: Thoracic;  Laterality: Right;   TUBAL LIGATION     VIDEO BRONCHOSCOPY  11/09/2017   Procedure: VIDEO BRONCHOSCOPY;  Surgeon: Chilton Greathouse, MD;  [facility]: [facility];  Service: Cardiopulmonary;;   XI ROBOTIC ASSISTED PERICARDIAL WINDOW Left 02/07/2021   Procedure: XI ROBOTIC ASSISTED THORACOSCOPY PERICARDIAL WINDOW;   Surgeon: Corliss Skains, MD;  Location: [facility];  Service: Thoracic;  Laterality: Left;    PAST SOCIAL HISTORY:  Social History   Socioeconomic History   Marital status: Divorced    Spouse name: Not on file   Number of children: 1   Years of education: Not on file   Highest education level: Not on file  Occupational History   Not on file  Tobacco Use   Smoking status: Former    Packs/day: 0.50    Years: 45.00    Pack years: 22.50    Types: Cigarettes    Quit date: 09/29/2017    Years since quitting: 3.9   Smokeless tobacco: Never  Vaping Use   Vaping Use: Never used  Substance and Sexual Activity   Alcohol use: Not Currently   Drug use: No   Sexual activity: Not Currently  Other Topics Concern   Not on file  Social History Narrative   Not on file   Social Determinants of Health   Financial Resource Strain: Low Risk    Difficulty of Paying Living Expenses: Not hard at all  Food Insecurity: No Food Insecurity   Worried About Programme researcher, broadcasting/film/video in the Last Year: Never true   Ran Out of Food in the Last Year:  Never true  Transportation Needs: No Transportation Needs   Lack of Transportation (Medical): No   Lack of Transportation (Non-Medical): No  Physical Activity: Inactive   Days of Exercise per Week: 0 days   Minutes of Exercise per Session: 0 min  Stress: No Stress Concern Present   Feeling of Stress : Not at all  Social Connections: Moderately Isolated   Frequency of Communication with Friends and Family: More than three times a week   Frequency of Social Gatherings with Friends and Family: Twice a week   Attends Religious Services: More than 4 times per year   Active Member of [employer] or Organizations: No   Attends Banker Meetings: Never   Marital Status: Divorced  Catering manager Violence: Not At Risk   Fear of Current or Ex-Partner: No   Emotionally Abused: No   Physically Abused: No   Sexually Abused: No  The patient is divorced.  She  lives in [place] and her 56 year old granddaughter is also at home with her.  She is a retired Production designer, theatre/television/film for a [employer].  PAST FAMILY HISTORY: Family History  Problem Relation Age of Onset   Stroke Mother    Alzheimer's disease Mother    Heart disease Mother    Emphysema Father    Hypertension Brother    Heart attack Maternal Aunt    Heart failure Maternal Grandmother    Hypertension Paternal Grandmother     MEDICATIONS  Current Outpatient Medications  Medication Sig Dispense Refill   acetaminophen (TYLENOL) 325 MG tablet Take 2 tablets (650 mg total) by mouth every 6 (six) hours.     folic acid (FOLVITE) 1 MG tablet Take by mouth.     hydroxychloroquine (PLAQUENIL) 200 MG tablet TAKE 1 TABLET BY MOUTH TWICE DAILY MONDAY THROUGH FRIDAY ONLY. 120 tablet 0   omeprazole (PRILOSEC) 20 MG capsule Take 20 mg by mouth daily.     prochlorperazine (COMPAZINE) 10 MG tablet TAKE 1 TABLET(10 MG) BY MOUTH EVERY 6 HOURS AS NEEDED FOR NAUSEA OR VOMITING 30 tablet 2   vitamin C (ASCORBIC ACID) 500 MG tablet Take 500 mg by mouth daily.     VITAMIN D PO Take 1 tablet by mouth daily.     No current facility-administered medications for this encounter.    ALLERGIES: No Known Allergies  PHYSICAL EXAM:  Unable to assess due to encounter type.   IMPRESSION/PLAN: 1. Progressive Metastatic Stage IIIA, cT3N1-2M0 NSCLC, adenocarcinoma of the right lower lobe with brain metastases.  We discussed moving to a 75-month schedule for her surveillance with MRI scans.  She we will follow-up with Dr. Arbutus Ped and has plans to ask about her options of continuing immunotherapy and for how long she is able to do this.  She would like to continue as long as possible since she has tolerated this well. 2. Pleural effusion.  She will follow-up with Dr. Cliffton Asters.  We appreciate his input and will follow this expectantly. 3. IV placement.  I apologize for the patient's experience, I assured her that Dr. Mitzi Hansen and  I did not expect her to have to get stuck 5 times in order to have an appropriate scan however there are benefits of having a contrasted which she certainly understands.  I will call over to [facility] to see if we can have further discussion to try and improve the experience during her next scan.  Given current concerns for patient exposure during the COVID-19 pandemic, this encounter was conducted  via telephone.  The patient has given verbal consent for this type of encounter. The time spent during this encounter was 45 minutes and 50% of that time was spent in preparation, discussion, and in the coordination of her care. The attendants for this meeting include Laurence Aly, [facility] and [name].  During the encounter, Laurence Aly [facility] was located in the Radiation Department at the [facility]. BLAKELYNN SEKELSKY was located remotely at home.    Osker Mason, PAC

## 2021-09-04 ENCOUNTER — Inpatient Hospital Stay: Payer: Medicare PPO

## 2021-09-04 ENCOUNTER — Other Ambulatory Visit: Payer: Self-pay | Admitting: Radiation Therapy

## 2021-09-04 ENCOUNTER — Other Ambulatory Visit: Payer: Self-pay | Admitting: Medical Oncology

## 2021-09-04 ENCOUNTER — Other Ambulatory Visit: Payer: Self-pay

## 2021-09-04 ENCOUNTER — Inpatient Hospital Stay: Payer: Medicare PPO | Admitting: Internal Medicine

## 2021-09-04 ENCOUNTER — Encounter: Payer: Self-pay | Admitting: Internal Medicine

## 2021-09-04 VITALS — BP 129/64 | HR 119 | Temp 97.3°F | Resp 18 | Wt 107.3 lb

## 2021-09-04 DIAGNOSIS — C3431 Malignant neoplasm of lower lobe, right bronchus or lung: Secondary | ICD-10-CM

## 2021-09-04 DIAGNOSIS — C3490 Malignant neoplasm of unspecified part of unspecified bronchus or lung: Secondary | ICD-10-CM

## 2021-09-04 DIAGNOSIS — C3491 Malignant neoplasm of unspecified part of right bronchus or lung: Secondary | ICD-10-CM

## 2021-09-04 DIAGNOSIS — D649 Anemia, unspecified: Secondary | ICD-10-CM | POA: Diagnosis not present

## 2021-09-04 DIAGNOSIS — R000 Tachycardia, unspecified: Secondary | ICD-10-CM

## 2021-09-04 DIAGNOSIS — Z5112 Encounter for antineoplastic immunotherapy: Secondary | ICD-10-CM

## 2021-09-04 DIAGNOSIS — C7931 Secondary malignant neoplasm of brain: Secondary | ICD-10-CM | POA: Diagnosis not present

## 2021-09-04 DIAGNOSIS — J90 Pleural effusion, not elsewhere classified: Secondary | ICD-10-CM | POA: Diagnosis not present

## 2021-09-04 DIAGNOSIS — Z923 Personal history of irradiation: Secondary | ICD-10-CM | POA: Diagnosis not present

## 2021-09-04 DIAGNOSIS — R5383 Other fatigue: Secondary | ICD-10-CM | POA: Diagnosis not present

## 2021-09-04 LAB — CBC WITH DIFFERENTIAL (CANCER CENTER ONLY)
Abs Immature Granulocytes: 0.03 10*3/uL (ref 0.00–0.07)
Basophils Absolute: 0 10*3/uL (ref 0.0–0.1)
Basophils Relative: 0 %
Eosinophils Absolute: 0.1 10*3/uL (ref 0.0–0.5)
Eosinophils Relative: 1 %
HCT: 32.7 % — ABNORMAL LOW (ref 36.0–46.0)
Hemoglobin: 10.3 g/dL — ABNORMAL LOW (ref 12.0–15.0)
Immature Granulocytes: 0 %
Lymphocytes Relative: 15 %
Lymphs Abs: 1.2 10*3/uL (ref 0.7–4.0)
MCH: 31.1 pg (ref 26.0–34.0)
MCHC: 31.5 g/dL (ref 30.0–36.0)
MCV: 98.8 fL (ref 80.0–100.0)
Monocytes Absolute: 0.8 10*3/uL (ref 0.1–1.0)
Monocytes Relative: 10 %
Neutro Abs: 5.7 10*3/uL (ref 1.7–7.7)
Neutrophils Relative %: 74 %
Platelet Count: 414 10*3/uL — ABNORMAL HIGH (ref 150–400)
RBC: 3.31 MIL/uL — ABNORMAL LOW (ref 3.87–5.11)
RDW: 12.4 % (ref 11.5–15.5)
WBC Count: 7.9 10*3/uL (ref 4.0–10.5)
nRBC: 0 % (ref 0.0–0.2)

## 2021-09-04 LAB — CMP (CANCER CENTER ONLY)
ALT: 7 U/L (ref 0–44)
AST: 16 U/L (ref 15–41)
Albumin: 3.3 g/dL — ABNORMAL LOW (ref 3.5–5.0)
Alkaline Phosphatase: 93 U/L (ref 38–126)
Anion gap: 5 (ref 5–15)
BUN: 17 mg/dL (ref 8–23)
CO2: 34 mmol/L — ABNORMAL HIGH (ref 22–32)
Calcium: 10.9 mg/dL — ABNORMAL HIGH (ref 8.9–10.3)
Chloride: 100 mmol/L (ref 98–111)
Creatinine: 1.16 mg/dL — ABNORMAL HIGH (ref 0.44–1.00)
GFR, Estimated: 52 mL/min — ABNORMAL LOW (ref >60)
Glucose, Bld: 102 mg/dL — ABNORMAL HIGH (ref 70–99)
Potassium: 4.2 mmol/L (ref 3.5–5.1)
Sodium: 139 mmol/L (ref 135–145)
Total Bilirubin: 0.2 mg/dL — ABNORMAL LOW (ref 0.3–1.2)
Total Protein: 8.4 g/dL — ABNORMAL HIGH (ref 6.5–8.1)

## 2021-09-04 LAB — TSH: TSH: 2.64 u[IU]/mL (ref 0.350–4.500)

## 2021-09-04 MED ORDER — SODIUM CHLORIDE 0.9 % IV SOLN: Freq: Once | INTRAVENOUS | Status: AC

## 2021-09-04 MED ORDER — SODIUM CHLORIDE 0.9 % IV SOLN
200.0000 mg | Freq: Once | INTRAVENOUS | Status: AC
  Administered 2021-09-04: 200 mg via INTRAVENOUS
  Filled 2021-09-04: qty 200

## 2021-09-04 MED ORDER — SODIUM CHLORIDE 0.9 % IV SOLN: Freq: Once | INTRAVENOUS | Status: DC

## 2021-09-04 NOTE — Progress Notes (Signed)
?    [place] ?[phone]   [phone] ? ?OFFICE PROGRESS NOTE ? ?Revankar, Reita Cliche, MD ?[address] ?[place]  [place] [address] ? ?DIAGNOSIS: Metastatic non-small cell lung cancer initially diagnosed as stage IIIA (T3, N1/N2, M0) non-small cell lung cancer, adenocarcinoma presented with large right lower lobe lung mass with extension to the right hilum and subcarinal area diagnosed in July 2019.  She has brain metastasis in October 2020. ?  ?Biomarker Findings ?Tumor Mutational Burden - TMB-Intermediate (6 Muts/Mb) ?Microsatellite status - MS-Stable ?Genomic Findings ?For a complete list of the genes assayed, please refer to the Appendix. ?NRAS Q61R ?ARAF amplification ?STK11 G56W ?KRAS G13D ?MYCN amplification ?MCL1 amplification ?NKX2-1 amplification - equivocal? ?TP53 G245V ?7 Disease relevant genes with no reportable alterations: EGFR, ALK, ?BRAF, MET, ERBB2, RET, ROS1  ?  ?PRIOR THERAPY: ?1) Course of concurrent chemoradiation with weekly carboplatin for AUC of 2 and paclitaxel 45 mg/M2.  Status post 7 cycles.  Last dose was giving 01/11/2018. ?2) Consolidation treatment with immunotherapy with Imfinzi (Durvalumab) 10 mg/KG every 2 weeks.  First dose February 09, 2018.  Status post 19 cycles. ?3) status post stereotactic body radiotherapy to the enlarging right supraclavicular lymphadenopathy under the care of Dr. Lisbeth Renshaw. ?4) SRS to multiple brain metastasis under the care of Dr. Lisbeth Renshaw. ?  ?CURRENT THERAPY: Systemic chemotherapy with carboplatin for AUC of 5, Alimta 500 mg/M2 and Keytruda 200 mg IV every 3 weeks.  First dose 08/16/2019.  Status post 34 cycles.  Starting from cycle #5 the patient is on maintenance treatment with Alimta and Keytruda every 3 weeks. Alimta was reduced to 400 mg/m2 starting from cycle #21.  Starting from cycle #30 her treatment was changed to single agent Keytruda every 3 weeks.  Alimta was discontinued secondary to  toxicity. ? ?INTERVAL HISTORY: ?Susan Holder 66 y.o. female returns to the clinic today for follow-up visit.  The patient is feeling a little bit better but continues to have fatigue and tachycardia.  She denied having any chest pain but has a baseline shortness of breath and she is currently on home oxygen.  She has no cough or hemoptysis.  She has no nausea, vomiting, diarrhea or constipation.  She has no headache or visual changes.  She continues to tolerate her treatment with single agent Keytruda fairly well.  The patient had repeat CT scan of the chest, abdomen pelvis performed 2 weeks ago and she is here for evaluation and discussion of her scan results and recommendation regarding her condition. ? ?MEDICAL HISTORY: ?Past Medical History:  ?Diagnosis Date  ? Acid reflux 09/21/2018  ? Adenocarcinoma of right lung, stage 4 ([place]) 11/17/2017  ? Allergic rhinitis 08/22/2013  ? ANA positive 05/29/2016  ? Aortic atherosclerosis ([place]) 10/23/2020  ? Brain metastases 03/29/2019  ? COPD (chronic obstructive pulmonary disease) ([place])   ? Coronary artery calcification seen on CT scan 10/23/2020  ? DDD (degenerative disc disease), cervical 05/29/2016  ? Dyspnea   ? Encounter for antineoplastic chemotherapy 11/17/2017  ? Encounter for antineoplastic immunotherapy 02/01/2018  ? Goals of care, counseling/discussion 11/17/2017  ? High risk medication use 05/29/2016  ? 04/29/2016: ==> plq 200 am & 100 qhs(adeq response).  ? Hoarseness 03/16/2018  ? Left hand pain 03/06/2017  ? Lung mass   ? Malnutrition of moderate degree 02/09/2021  ? met lung ca dx'd 09/2017  ? neck LN and brain 2020  ? 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[place]) 11/04/2018  ?  Other fatigue 05/29/2016  ? Pericardial effusion 10/23/2020  ? Primary malignant neoplasm of bronchus of right lower lobe ([place]) 12/21/2017  ? Rheumatoid arthritis ([place])   ? Rheumatoid arthritis involving multiple sites with positive rheumatoid factor ([place]) 05/29/2016  ? +RF +ANA  +CCP   ? 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  ? Smoker 05/29/2016  ? Trigger finger, left ring finger 05/29/2016  ? Trigger finger, right ring finger 05/29/2016  ? Vitamin D deficiency 05/29/2016  ? ? ?ALLERGIES:  has No Known Allergies. ? ?MEDICATIONS:  ?Current Outpatient Medications  ?Medication Sig Dispense Refill  ? acetaminophen (TYLENOL) 325 MG tablet Take 2 tablets (650 mg total) by mouth every 6 (six) hours.    ? folic acid (FOLVITE) 1 MG tablet Take by mouth.    ? hydroxychloroquine (PLAQUENIL) 200 MG tablet TAKE 1 TABLET BY MOUTH TWICE DAILY MONDAY THROUGH FRIDAY ONLY. 120 tablet 0  ? omeprazole (PRILOSEC) 20 MG capsule Take 20 mg by mouth daily.    ? prochlorperazine (COMPAZINE) 10 MG tablet TAKE 1 TABLET(10 MG) BY MOUTH EVERY 6 HOURS AS NEEDED FOR NAUSEA OR VOMITING 30 tablet 2  ? vitamin C (ASCORBIC ACID) 500 MG tablet Take 500 mg by mouth daily.    ? VITAMIN D PO Take 1 tablet by mouth daily.    ? ?No current facility-administered medications for this visit.  ? ? ?SURGICAL HISTORY:  ?Past Surgical History:  ?Procedure Laterality Date  ? BRONCHIAL NEEDLE ASPIRATION BIOPSY  11/09/2017  ? Procedure: BRONCHIAL NEEDLE ASPIRATION BIOPSIES;  Surgeon: Marshell Garfinkel, MD;  [facility];  Service: Cardiopulmonary;;  ? CHEST TUBE INSERTION Left 05/13/2021  ? Procedure: INSERTION PLEURAL DRAINAGE CATHETER;  Surgeon: Lajuana Matte, MD;  Location: [place];  Service: Thoracic;  Laterality: Left;  ? ENDOBRONCHIAL ULTRASOUND Bilateral 11/09/2017  ? Procedure: ENDOBRONCHIAL ULTRASOUND;  Surgeon: Marshell Garfinkel, MD;  [facility]: [facility];  Service: Cardiopulmonary;  Laterality: Bilateral;  ? NASAL SINUS SURGERY    ? NASAL SINUS SURGERY    ? THORACENTESIS Right 02/07/2021  ? Procedure: THORACENTESIS;  Surgeon: Lajuana Matte, MD;  Location: [place];  Service: Thoracic;  Laterality: Right;  ? TUBAL LIGATION    ? VIDEO BRONCHOSCOPY  11/09/2017  ? Procedure: VIDEO BRONCHOSCOPY;  Surgeon: Marshell Garfinkel, MD;  [facility];  Service: Cardiopulmonary;;  ? XI ROBOTIC ASSISTED PERICARDIAL WINDOW Left 02/07/2021  ? Procedure: XI ROBOTIC ASSISTED THORACOSCOPY PERICARDIAL WINDOW;  Surgeon: Lajuana Matte, MD;  Location: [place];  Service: Thoracic;  Laterality: Left;  ? ? ?REVIEW OF SYSTEMS:  Constitutional: positive for fatigue ?Eyes: negative ?Ears, nose, mouth, throat, and face: negative ?Respiratory: positive for dyspnea on exertion ?Cardiovascular: negative ?Gastrointestinal: negative ?Genitourinary:negative ?Integument/breast: negative ?Hematologic/lymphatic: negative ?Musculoskeletal:positive for muscle weakness ?Neurological: negative ?Behavioral/Psych: negative ?Endocrine: negative ?Allergic/Immunologic: negative  ? ?PHYSICAL EXAMINATION: General appearance: alert, cooperative, fatigued, and no distress ?Head: Normocephalic, without obvious abnormality, atraumatic ?Neck: no adenopathy, no JVD, supple, symmetrical, trachea midline, and thyroid not enlarged, symmetric, no tenderness/mass/nodules ?Lymph nodes: Cervical, supraclavicular, and axillary nodes normal. ?Resp: clear to auscultation bilaterally ?Back: symmetric, no curvature. ROM normal. No CVA tenderness. ?Cardio: regular rate and rhythm, S1, S2 normal, no murmur, click, rub or gallop ?GI: soft, non-tender; bowel sounds normal; no masses,  no organomegaly ?Extremities: extremities normal, atraumatic, no cyanosis or edema ?Neurologic: Alert and oriented X 3, normal strength and tone. Normal symmetric reflexes. Normal coordination and gait ? ?ECOG PERFORMANCE STATUS: 1 - Symptomatic but completely ambulatory ? ?Blood pressure 129/64,  pulse (!) 119, temperature (!) 97.3 ?F (36.3 ?C), temperature source Tympanic, resp. rate 18, weight 107 lb 5 oz (48.7 kg), last menstrual period 04/28/2000, SpO2 93 %. ? ?LABORATORY DATA: ?Lab Results  ?Component Value Date  ? WBC 7.9 09/04/2021  ? HGB 10.3 (L) 09/04/2021  ? HCT 32.7 (L) 09/04/2021  ? MCV  98.8 09/04/2021  ? PLT 414 (H) 09/04/2021  ? ? ?  Chemistry   ?   ?Component Value Date/Time  ? NA 140 08/14/2021 1357  ? K 3.6 08/14/2021 1357  ? CL 104 08/14/2021 1357  ? CO2 31 08/14/2021 1357  ? BUN 16 08/14/2021 135

## 2021-09-04 NOTE — Progress Notes (Signed)
Per Dr Julien Nordmann , it is okay to treat pt today  with Keytruda and heart rate of 119. ?

## 2021-09-04 NOTE — Patient Instructions (Signed)
[facility]  Discharge Instructions: ?Thank you for choosing [facility] to provide your oncology and hematology care.  ? ?If you have a lab appointment with the Cancer Center, please go directly to the Cancer Center and check in at the registration area. ?  ?Wear comfortable clothing and clothing appropriate for easy access to any Portacath or PICC line.  ? ?We strive to give you quality time with your provider. You may need to reschedule your appointment if you arrive late (15 or more minutes).  Arriving late affects you and other patients whose appointments are after yours.  Also, if you miss three or more appointments without notifying the office, you may be dismissed from the clinic at the provider?s discretion.    ?  ?For prescription refill requests, have your pharmacy contact our office and allow 72 hours for refills to be completed.   ? ?Today you received the following chemotherapy and/or immunotherapy agents: Keytruda ?  ?To help prevent nausea and vomiting after your treatment, we encourage you to take your nausea medication as directed. ? ?BELOW ARE SYMPTOMS THAT SHOULD BE REPORTED IMMEDIATELY: ?*FEVER GREATER THAN 100.4 F (38 ?C) OR HIGHER ?*CHILLS OR SWEATING ?*NAUSEA AND VOMITING THAT IS NOT CONTROLLED WITH YOUR NAUSEA MEDICATION ?*UNUSUAL SHORTNESS OF BREATH ?*UNUSUAL BRUISING OR BLEEDING ?*URINARY PROBLEMS (pain or burning when urinating, or frequent urination) ?*BOWEL PROBLEMS (unusual diarrhea, constipation, pain near the anus) ?TENDERNESS IN MOUTH AND THROAT WITH OR WITHOUT PRESENCE OF ULCERS (sore throat, sores in mouth, or a toothache) ?UNUSUAL RASH, SWELLING OR PAIN  ?UNUSUAL VAGINAL DISCHARGE OR ITCHING  ? ?Items with * indicate a potential emergency and should be followed up as soon as possible or go to the Emergency Department if any problems should occur. ? ?Please show the CHEMOTHERAPY ALERT CARD or IMMUNOTHERAPY ALERT CARD at check-in to the  Emergency Department and triage nurse. ? ?Should you have questions after your visit or need to cancel or reschedule your appointment, please contact [facility]  Dept: [phone]  and follow the prompts.  Office hours are 8:00 a.m. to 4:30 p.m. Monday - Friday. Please note that voicemails left after 4:00 p.m. may not be returned until the following business day.  We are closed weekends and major holidays. You have access to a nurse at all times for urgent questions. Please call the main number to the clinic Dept: [phone] and follow the prompts. ? ? ?For any non-urgent questions, you may also contact your provider using MyChart. We now offer e-Visits for anyone 18 and older to request care online for non-urgent symptoms. For details visit [email or web address]. ?  ?Also download the MyChart app! Go to the app store, search "MyChart", open the app, select [facility], and log in with your MyChart username and password. ? ?Due to Covid, a mask is required upon entering the hospital/clinic. If you do not have a mask, one will be given to you upon arrival. For doctor visits, patients may have 1 support person aged 18 or older with them. For treatment visits, patients cannot have anyone with them due to current Covid guidelines and our immunocompromised population.  ? ?

## 2021-09-06 ENCOUNTER — Ambulatory Visit: Payer: Medicare PPO | Admitting: Thoracic Surgery (Cardiothoracic Vascular Surgery)

## 2021-09-06 VITALS — BP 128/75 | HR 118 | Resp 20 | Ht 60.0 in | Wt 107.0 lb

## 2021-09-06 DIAGNOSIS — J90 Pleural effusion, not elsewhere classified: Secondary | ICD-10-CM

## 2021-09-06 DIAGNOSIS — Z938 Other artificial opening status: Secondary | ICD-10-CM | POA: Diagnosis not present

## 2021-09-06 NOTE — Progress Notes (Signed)
? ?   ?  [address] ?      [place] [address] ?            [phone]       ? ?Farrel Gobble ?[facility] Record #[id number] ?Date of Birth: [birth year] ? ?Referring: RevankarReita Cliche, MD ?Primary Care: Jenean Lindau, MD ?Primary Cardiologist:None ? ?Reason for visit:   follow-up ? ?History of Present Illness:     ?Ms. Gravois comes in for removal of her Pleurx catheter.  She has had minimal drainage over the last several weeks.  She denies any shortness of breath. ? ?Physical Exam: ?BP 128/75 (BP Location: Left Arm, Patient Position: Sitting)   Pulse (!) 118   Resp 20   Ht 5' (1.524 m)   Wt 107 lb (48.5 kg)   LMP 04/28/2000   SpO2 100% Comment: 2L o2  BMI 20.90 kg/m?  ? ?NAD. ?Serous drainage within the tube. ? ?Procedure note. ?The area around the tube was injected with 1% lidocaine.  Using blunt dissection and tension on the 2 we were able to mobilize the cuff.  Once out from under the skin the tube came out without any problems.  The area was then dried with gauze and tape. ? ?  ? ?Assessment / Plan:   ?66 year old female with history of stage IV lung cancer, status post left VATS pericardial window without chronic left pleural effusion.  A Pleurx catheter was inserted and has now been removed.  She will follow-up in 1 month with a chest x-ray. ? ? ?Lucile Crater Demetrius Barrell ?09/06/2021 1:21 PM ? ? ? ? ? ? ?

## 2021-09-25 ENCOUNTER — Ambulatory Visit: Payer: Medicare PPO | Admitting: Physician Assistant

## 2021-09-25 ENCOUNTER — Ambulatory Visit: Payer: Medicare PPO

## 2021-09-25 ENCOUNTER — Other Ambulatory Visit: Payer: Medicare PPO

## 2021-10-16 ENCOUNTER — Other Ambulatory Visit: Payer: Medicare PPO

## 2021-10-16 ENCOUNTER — Ambulatory Visit: Payer: Medicare PPO

## 2021-10-16 ENCOUNTER — Ambulatory Visit: Payer: Medicare PPO | Admitting: Internal Medicine

## 2021-10-17 ENCOUNTER — Other Ambulatory Visit: Payer: Self-pay | Admitting: Thoracic Surgery (Cardiothoracic Vascular Surgery)

## 2021-10-17 DIAGNOSIS — R918 Other nonspecific abnormal finding of lung field: Secondary | ICD-10-CM

## 2021-10-18 ENCOUNTER — Ambulatory Visit: Payer: Medicare PPO | Admitting: Thoracic Surgery (Cardiothoracic Vascular Surgery)

## 2021-10-18 ENCOUNTER — Ambulatory Visit
Admission: RE | Admit: 2021-10-18 | Discharge: 2021-10-18 | Disposition: A | Discharge status: Home / Self Care | Payer: Medicare PPO | Source: Ambulatory Visit | Attending: Thoracic Surgery (Cardiothoracic Vascular Surgery) | Admitting: Thoracic Surgery (Cardiothoracic Vascular Surgery)

## 2021-10-18 VITALS — BP 112/71 | HR 100 | Resp 20 | Wt 107.0 lb

## 2021-10-18 DIAGNOSIS — C3491 Malignant neoplasm of unspecified part of right bronchus or lung: Secondary | ICD-10-CM

## 2021-10-18 DIAGNOSIS — J90 Pleural effusion, not elsewhere classified: Secondary | ICD-10-CM

## 2021-10-18 DIAGNOSIS — R918 Other nonspecific abnormal finding of lung field: Secondary | ICD-10-CM

## 2021-10-25 ENCOUNTER — Ambulatory Visit: Payer: Medicare PPO | Admitting: Thoracic Surgery (Cardiothoracic Vascular Surgery)

## 2021-10-30 ENCOUNTER — Emergency Department (HOSPITAL_COMMUNITY): Payer: Medicare PPO

## 2021-10-30 ENCOUNTER — Other Ambulatory Visit: Payer: Self-pay

## 2021-10-30 ENCOUNTER — Emergency Department (HOSPITAL_COMMUNITY)
Admission: EM | Admit: 2021-10-30 | Discharge: 2021-10-30 | Disposition: A | Discharge status: Home / Self Care | Payer: Medicare PPO | Attending: Emergency Medicine | Admitting: Emergency Medicine

## 2021-10-30 DIAGNOSIS — Z85118 Personal history of other malignant neoplasm of bronchus and lung: Secondary | ICD-10-CM | POA: Insufficient documentation

## 2021-10-30 DIAGNOSIS — Z923 Personal history of irradiation: Secondary | ICD-10-CM | POA: Diagnosis not present

## 2021-10-30 DIAGNOSIS — Z20822 Contact with and (suspected) exposure to covid-19: Secondary | ICD-10-CM | POA: Diagnosis not present

## 2021-10-30 DIAGNOSIS — H814 Vertigo of central origin: Secondary | ICD-10-CM | POA: Diagnosis not present

## 2021-10-30 DIAGNOSIS — R270 Ataxia, unspecified: Secondary | ICD-10-CM | POA: Diagnosis not present

## 2021-10-30 DIAGNOSIS — R42 Dizziness and giddiness: Secondary | ICD-10-CM | POA: Insufficient documentation

## 2021-10-30 DIAGNOSIS — C7931 Secondary malignant neoplasm of brain: Secondary | ICD-10-CM | POA: Diagnosis not present

## 2021-10-30 DIAGNOSIS — Z85841 Personal history of malignant neoplasm of brain: Secondary | ICD-10-CM | POA: Insufficient documentation

## 2021-10-30 DIAGNOSIS — I959 Hypotension, unspecified: Secondary | ICD-10-CM | POA: Diagnosis not present

## 2021-10-30 DIAGNOSIS — C719 Malignant neoplasm of brain, unspecified: Secondary | ICD-10-CM

## 2021-10-30 DIAGNOSIS — M069 Rheumatoid arthritis, unspecified: Secondary | ICD-10-CM | POA: Diagnosis not present

## 2021-10-30 DIAGNOSIS — R260 Ataxic gait: Secondary | ICD-10-CM | POA: Insufficient documentation

## 2021-10-30 DIAGNOSIS — R2681 Unsteadiness on feet: Secondary | ICD-10-CM | POA: Insufficient documentation

## 2021-10-30 DIAGNOSIS — G939 Disorder of brain, unspecified: Secondary | ICD-10-CM

## 2021-10-30 DIAGNOSIS — R9431 Abnormal electrocardiogram [ECG] [EKG]: Secondary | ICD-10-CM | POA: Diagnosis not present

## 2021-10-30 DIAGNOSIS — G3281 Cerebellar ataxia in diseases classified elsewhere: Secondary | ICD-10-CM | POA: Diagnosis not present

## 2021-10-30 LAB — URINALYSIS, ROUTINE W REFLEX MICROSCOPIC
Bilirubin Urine: NEGATIVE
Glucose, UA: NEGATIVE mg/dL
Hgb urine dipstick: NEGATIVE
Ketones, ur: NEGATIVE mg/dL
Nitrite: NEGATIVE
Protein, ur: NEGATIVE mg/dL
Specific Gravity, Urine: 1.008 (ref 1.005–1.030)
pH: 5 (ref 5.0–8.0)

## 2021-10-30 LAB — I-STAT CHEM 8, ED
BUN: 21 mg/dL (ref 8–23)
Calcium, Ion: 1.34 mmol/L (ref 1.15–1.40)
Chloride: 103 mmol/L (ref 98–111)
Creatinine, Ser: 1.3 mg/dL — ABNORMAL HIGH (ref 0.44–1.00)
Glucose, Bld: 103 mg/dL — ABNORMAL HIGH (ref 70–99)
HCT: 35 % — ABNORMAL LOW (ref 36.0–46.0)
Hemoglobin: 11.9 g/dL — ABNORMAL LOW (ref 12.0–15.0)
Potassium: 4 mmol/L (ref 3.5–5.1)
Sodium: 136 mmol/L (ref 135–145)
TCO2: 26 mmol/L (ref 22–32)

## 2021-10-30 LAB — COMPREHENSIVE METABOLIC PANEL
ALT: 9 U/L (ref 0–44)
AST: 20 U/L (ref 15–41)
Albumin: 3.1 g/dL — ABNORMAL LOW (ref 3.5–5.0)
Alkaline Phosphatase: 91 U/L (ref 38–126)
Anion gap: 9 (ref 5–15)
BUN: 19 mg/dL (ref 8–23)
CO2: 26 mmol/L (ref 22–32)
Calcium: 11.1 mg/dL — ABNORMAL HIGH (ref 8.9–10.3)
Chloride: 100 mmol/L (ref 98–111)
Creatinine, Ser: 1.28 mg/dL — ABNORMAL HIGH (ref 0.44–1.00)
GFR, Estimated: 46 mL/min — ABNORMAL LOW (ref >60)
Glucose, Bld: 101 mg/dL — ABNORMAL HIGH (ref 70–99)
Potassium: 4.1 mmol/L (ref 3.5–5.1)
Sodium: 135 mmol/L (ref 135–145)
Total Bilirubin: 0.4 mg/dL (ref 0.3–1.2)
Total Protein: 7.9 g/dL (ref 6.5–8.1)

## 2021-10-30 LAB — DIFFERENTIAL
Abs Immature Granulocytes: 0.02 10*3/uL (ref 0.00–0.07)
Basophils Absolute: 0 10*3/uL (ref 0.0–0.1)
Basophils Relative: 1 %
Eosinophils Absolute: 0.1 10*3/uL (ref 0.0–0.5)
Eosinophils Relative: 1 %
Immature Granulocytes: 0 %
Lymphocytes Relative: 12 %
Lymphs Abs: 0.8 10*3/uL (ref 0.7–4.0)
Monocytes Absolute: 0.8 10*3/uL (ref 0.1–1.0)
Monocytes Relative: 12 %
Neutro Abs: 4.9 10*3/uL (ref 1.7–7.7)
Neutrophils Relative %: 74 %

## 2021-10-30 LAB — RESP PANEL BY RT-PCR (FLU A&B, COVID) ARPGX2
Influenza A by PCR: NEGATIVE
Influenza B by PCR: NEGATIVE
SARS Coronavirus 2 by RT PCR: NEGATIVE

## 2021-10-30 LAB — CBC
HCT: 36 % (ref 36.0–46.0)
Hemoglobin: 10.7 g/dL — ABNORMAL LOW (ref 12.0–15.0)
MCH: 29.6 pg (ref 26.0–34.0)
MCHC: 29.7 g/dL — ABNORMAL LOW (ref 30.0–36.0)
MCV: 99.4 fL (ref 80.0–100.0)
Platelets: 307 10*3/uL (ref 150–400)
RBC: 3.62 MIL/uL — ABNORMAL LOW (ref 3.87–5.11)
RDW: 13.3 % (ref 11.5–15.5)
WBC: 6.5 10*3/uL (ref 4.0–10.5)
nRBC: 0 % (ref 0.0–0.2)

## 2021-10-30 LAB — RAPID URINE DRUG SCREEN, HOSP PERFORMED
Amphetamines: NOT DETECTED
Barbiturates: NOT DETECTED
Benzodiazepines: NOT DETECTED
Cocaine: NOT DETECTED
Opiates: NOT DETECTED
Tetrahydrocannabinol: NOT DETECTED

## 2021-10-30 LAB — PROTIME-INR
INR: 1 (ref 0.8–1.2)
Prothrombin Time: 13.2 seconds (ref 11.4–15.2)

## 2021-10-30 LAB — APTT: aPTT: 29 seconds (ref 24–36)

## 2021-10-30 LAB — ETHANOL: Alcohol, Ethyl (B): <10 mg/dL (ref <10)

## 2021-10-30 MED ORDER — LORAZEPAM 1 MG PO TABS
0.5000 mg | ORAL_TABLET | ORAL | Status: AC | PRN
  Administered 2021-10-30: 0.5 mg via ORAL
  Filled 2021-10-30: qty 1

## 2021-10-30 MED ORDER — GADOBUTROL 1 MMOL/ML IV SOLN
5.0000 mL | Freq: Once | INTRAVENOUS | Status: AC | PRN
  Administered 2021-10-30: 5 mL via INTRAVENOUS

## 2021-10-30 MED ORDER — DEXAMETHASONE SODIUM PHOSPHATE 10 MG/ML IJ SOLN
8.0000 mg | Freq: Once | INTRAMUSCULAR | Status: AC
  Administered 2021-10-30: 8 mg via INTRAVENOUS
  Filled 2021-10-30: qty 1

## 2021-10-30 MED ORDER — LORAZEPAM 2 MG/ML IJ SOLN
0.5000 mg | INTRAMUSCULAR | Status: AC | PRN
  Administered 2021-10-30: 0.5 mg via INTRAVENOUS
  Filled 2021-10-30: qty 1

## 2021-10-30 MED ORDER — LORAZEPAM 2 MG/ML IJ SOLN: 1.0000 mg | INTRAMUSCULAR | Status: DC | PRN

## 2021-10-30 NOTE — ED Notes (Signed)
Lab at [facility]. Unable to obtain blood.

## 2021-10-30 NOTE — ED Notes (Signed)
Back from CT

## 2021-10-30 NOTE — ED Triage Notes (Addendum)
Pt arrived via [facility] from home with c/c of dizziness. Per EMS pt has had dizziness that has worsened over past 2 days, stating " I get super dizzy when I walk anywhere". Hx of Lung Cancer with mets to brain. Pts last radiation was 2 months ago. Denies N?V  130/70, 100HR, 100% 2LNC, CBG 109

## 2021-10-30 NOTE — ED Provider Notes (Signed)
[facility] EMERGENCY DEPARTMENT Provider Note   CSN: [id number] Arrival date & time: 10/30/21  1259     History  No chief complaint on file.   [facility] is a 66 y.o. female.  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  The patient states that she has had significant difficulty with ambulation.  She states that she has room spinning dizziness and unsteadiness of her trunk when she ambulates.  Symptoms have been progressively worsening over the past 2 days but acutely worsened this morning.  Of note, MRI brain with and without contrast in May 2023 showed a 7 mm left inferior cerebellar lesion and a 3 mm lesion in the more lateral left inferior cerebellar hemisphere.  Home Medications Prior to Admission medications   Medication Sig Start Date End Date Taking? Authorizing Provider  acetaminophen (TYLENOL) 325 MG tablet Take 2 tablets (650 mg total) by mouth every 6 (six) hours. Patient taking differently: Take 650 mg by mouth every 6 (six) hours as needed for moderate pain. 05/14/21  Yes Barrett, Erin R, PA-C  folic acid (FOLVITE) 1 MG tablet Take 1 mg by mouth daily. 06/27/21  Yes [provider]  hydroxychloroquine (PLAQUENIL) 200 MG tablet TAKE 1 TABLET BY MOUTH TWICE DAILY MONDAY THROUGH FRIDAY ONLY. Patient taking differently: Take 200 mg by mouth See admin instructions. Take 1 tablet by mouth twice daily Monday through Friday only 11/12/20  Yes Ofilia Neas, PA-C  omeprazole (PRILOSEC) 20 MG capsule Take 20 mg by mouth daily.   Yes [provider]  prochlorperazine (COMPAZINE) 10 MG tablet TAKE 1 TABLET(10 MG) BY MOUTH EVERY 6 HOURS AS NEEDED FOR NAUSEA OR  VOMITING Patient taking differently: Take 10 mg by mouth every 8 (eight) hours as needed for vomiting or nausea. 01/15/21  Yes Heilingoetter, Cassandra L, PA-C  vitamin C (ASCORBIC ACID) 500 MG tablet Take 500 mg by mouth daily.   Yes [provider]  VITAMIN D PO Take 1 tablet by mouth daily.   Yes [provider]      Allergies    Patient has no known allergies.    Review of Systems   Review of Systems  All other systems reviewed and are negative.   Physical Exam Updated Vital Signs BP 101/64   Pulse (!) 107   Temp 97.8 F (36.6 C) (Oral)   Resp 14   Ht 5' (1.524 m)   Wt 49 kg   LMP 04/28/2000   SpO2 100%   BMI 21.09 kg/m  Physical Exam Vitals and nursing note reviewed.  Constitutional:      General: She is not in acute distress.    Appearance: She is well-developed.  HENT:     Head: Normocephalic and atraumatic.  Eyes:     Conjunctiva/sclera: Conjunctivae normal.  Cardiovascular:     Rate and Rhythm: Normal rate and regular rhythm.  Pulmonary:     Effort: Pulmonary effort is normal. No respiratory distress.     Breath sounds: Normal breath sounds.  Abdominal:     Palpations: Abdomen is soft.     Tenderness: There is no abdominal tenderness.  Musculoskeletal:        General: No swelling.     Cervical back:  Neck supple.  Skin:    General: Skin is warm and dry.     Capillary Refill: Capillary refill takes less than 2 seconds.  Neurological:     Mental Status: She is alert.     Comments: MENTAL STATUS EXAM:    Orientation: Alert and oriented to person, place and time.  Memory: Cooperative, follows commands well.  Language: Speech is clear and language is normal.   CRANIAL NERVES:    CN 2 (Optic): Visual fields intact to confrontation.  CN 3,4,6 (EOM): Pupils equal and reactive to light. Full extraocular eye movement without nystagmus.  CN 5 (Trigeminal): Facial sensation is normal, no weakness of masticatory muscles.  CN 7 (Facial): No  facial weakness or asymmetry.  CN 8 (Auditory): Auditory acuity grossly normal.  CN 9,10 (Glossophar): The uvula is midline, the palate elevates symmetrically.  CN 11 (spinal access): Normal sternocleidomastoid and trapezius strength.  CN 12 (Hypoglossal): The tongue is midline. No atrophy or fasciculations.Marland Kitchen   MOTOR:  Muscle Strength: 5/5RUE, 5/5LUE, 5/5RLE, 5/5LLE.   COORDINATION:   Slight dysmetria on finger-to-nose testing on the right..   SENSATION:   Intact to light touch all four extremities.  Gait, no truncal ataxia on sitting up, full gait not assessed  Psychiatric:        Mood and Affect: Mood normal.     ED Results / Procedures / Treatments   Labs (all labs ordered are listed, but only abnormal results are displayed) Labs Reviewed  CBC - Abnormal; Notable for the following components:      Result Value   RBC 3.62 (*)    Hemoglobin 10.7 (*)    MCHC 29.7 (*)    All other components within normal limits  COMPREHENSIVE METABOLIC PANEL - Abnormal; Notable for the following components:   Glucose, Bld 101 (*)    Creatinine, Ser 1.28 (*)    Calcium 11.1 (*)    Albumin 3.1 (*)    GFR, Estimated 46 (*)    All other components within normal limits  URINALYSIS, ROUTINE W REFLEX MICROSCOPIC - Abnormal; Notable for the following components:   Color, Urine STRAW (*)    Leukocytes,Ua SMALL (*)    Bacteria, UA RARE (*)    All other components within normal limits  I-STAT CHEM 8, ED - Abnormal; Notable for the following components:   Creatinine, Ser 1.30 (*)    Glucose, Bld 103 (*)    Hemoglobin 11.9 (*)    HCT 35.0 (*)    All other components within normal limits  RESP PANEL BY RT-PCR (FLU A&B, COVID) ARPGX2  ETHANOL  PROTIME-INR  APTT  DIFFERENTIAL  RAPID URINE DRUG SCREEN, HOSP PERFORMED    EKG EKG Interpretation  Date/Time:  Wednesday October 30 2021 13:04:03 EDT Ventricular Rate:  101 PR Interval:  126 QRS Duration: 89 QT Interval:  329 QTC Calculation: 427 R  Axis:   74 Text Interpretation: Sinus tachycardia Borderline T wave abnormalities Confirmed by Octaviano Glow [phone]) on 10/30/2021 4:34:07 PM  Radiology CT HEAD WO CONTRAST  Result Date: 10/30/2021 CLINICAL DATA:  Dizziness, persistent/recurrent, cardiac or vascular cause suspected Brain metastases suspected EXAM: CT HEAD WITHOUT CONTRAST TECHNIQUE: Contiguous axial images were obtained from the base of the skull through the vertex without intravenous contrast. RADIATION DOSE REDUCTION: This exam was performed according to the departmental dose-optimization program which includes automated exposure control, adjustment of the mA and/or kV according to patient size and/or use of iterative reconstruction technique. COMPARISON:  MRI May  5, 23. FINDINGS: Brain: Known treated brain metastases are not well characterized on this noncontrast head CT. MRI with contrast could better assess for progression if clinically warranted. No evidence of acute large vascular territory infarct, acute hemorrhage, midline shift, hydrocephalus, or visible extra-axial fluid collection. Partially empty sella. Vascular: No hyperdense vessel identified. Skull: No acute fracture. Sinuses/Orbits: Visualized sinuses are clear. Other: No mastoid effusions. IMPRESSION: 1. Known treated brain metastases are not well characterized on this noncontrast head CT. MRI with contrast could better assess for progression if clinically warranted. 2. Otherwise, no evidence of new/interval acute intracranial abnormality. Electronically Signed   By: Margaretha Sheffield M.D.   On: 10/30/2021 14:53    Procedures Procedures    Medications Ordered in ED Medications  LORazepam (ATIVAN) injection 1 mg (has no administration in time range)  LORazepam (ATIVAN) tablet 0.5 mg (0.5 mg Oral Given 10/30/21 1759)  LORazepam (ATIVAN) injection 0.5 mg (0.5 mg Intravenous Given 10/30/21 1810)    ED Course/ Medical Decision Making/ A&P                            Medical Decision Making Amount and/or Complexity of Data Reviewed Labs: ordered. Radiology: ordered.  Risk Prescription drug management.   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  The patient states that she has had significant difficulty with ambulation.  She states that she has room spinning dizziness and unsteadiness of her trunk when she ambulates.  Symptoms have been progressively worsening over the past 2 days but acutely worsened this morning.  Of note, MRI brain with and without contrast in May 2023 showed a 7 mm left inferior cerebellar lesion and a 3 mm lesion in the more lateral left inferior cerebellar hemisphere.  On arrival, the patient was vitally stable.  Some dysmetria noted on the right.  Patient presenting with what sounds to be ataxic gait, currently asymptomatic.  She has a history of previous cerebellar lesions from her metastatic cancer.  Differential diagnosis includes new CVA versus worsening brain metastasis.  CT imaging of the head was performed which were negative for acute intracranial abnormality.  Laboratory evaluation significant for urinalysis with small leukocytes, rare bacteria, 11-20 WBCs, CBC without leukocytosis, anemia to 10.7, CMP without significant electrolyte abnormality, CMP with serum creatinine appears to be at baseline, calcium mildly hypercalcemic to 11.1.  COVID-19 influenza PCR testing negative.  Given the patient's known metastatic cancer, MRI imaging of the head and neck was ordered to further evaluate.  MRI imaging pending at time of signout.  Signout given to Dr. Langston Masker to reassess the patient and follow-up on MRI results, disposition pending reassessment and imaging  results.  Signout given at 1630.  Final Clinical Impression(s) / ED Diagnoses Final diagnoses:  None    Rx / DC Orders ED Discharge Orders     None         Regan Lemming, MD 10/30/21 [phone]

## 2021-10-30 NOTE — ED Notes (Signed)
Pt in MRI.

## 2021-10-30 NOTE — ED Notes (Signed)
Pt ambulated to restroom with this RN and Thuy NT. Pt states that she feels somewhat better than this morning but still does not feel herself. Pt slightly unsteady on feet and needs some assistance with walking. MD Trifan aware

## 2021-10-30 NOTE — ED Notes (Signed)
Dr Langston Masker at bedside

## 2021-10-30 NOTE — Discharge Instructions (Addendum)
Please call your oncologist about your balance difficulty.  We gave you some steroids in the ER.  I consulted with our case manager to reach out to you within the next day or 2 to talk about possible home physical therapy, if you are eligible.  In the meantime, strongly recommend that you use a walker whenever standing up or walking around the house for extra stability.  Your balance problems may be related to the cancer in your brain.  Thankfully, the cancer has not spread or gotten larger on your brain scan in the ER today.  We also did not see any signs of stroke on your MRI today.

## 2021-10-30 NOTE — ED Provider Notes (Signed)
66 yo female hx of metastatic cancer with brain mets, here with new worsening ataxia  Labs largely unremarkable  Pending MRI brain for CVA and malignancy evaluation  *  MR imaging of the brain reviewed.  Her brain lesions are stable.  No significant swelling noted.  No CVA.  On reassessment the patient was ambulated and feels that overall her stability is improved but she could does continue to have some unsteadiness, particularly when turning her head.  She has a walker at home.  We had a discussion about overnight observation and PT evaluation in the ER vs discharge home with use of a walker and possible home PT, which have consulted social work for her.  She prefers to go home at this time.  We did give her some steroids, as I suspect that her ataxia still likely related to a CNS process related to the cerebellar brain lesion.  Her medical work-up does not show any other clear cause for this issue.  I am hopeful with the steroids in some time her symptoms may improve, but in the meantime she will need to use a walker in the house, and will need to call her oncologist.  I also contacted her cousin with her permission who will come pick her up and bring her home.   Wyvonnia Dusky, MD 10/30/21 2229

## 2021-10-30 NOTE — ED Notes (Signed)
Patient verbalizes understanding of discharge instructions. Opportunity for questioning and answers were provided. Armband removed by staff, pt discharged from ED via wheelchair.  

## 2021-10-30 NOTE — ED Notes (Signed)
Pt cousin to come pick her up

## 2021-10-30 NOTE — ED Notes (Signed)
Walker delivered to pt at this time

## 2021-10-31 ENCOUNTER — Telehealth: Payer: Self-pay

## 2021-10-31 NOTE — Telephone Encounter (Signed)
Pt called to report that she presented to ED on 09/30/21 for dizziness. Inconclusive results as to why she wasn't able to walk. Pt was provided a walker and a referral for PT. Patient states that she is feeling better. I reviewed her HGB results with her (HGB 10.7 g/dl) and provided an appointment with our symptom management clinic. Patient declined. Patient understands that scheduling will call to schedule apt with Dr. Julien Nordmann.  Dr. Julien Nordmann aware of conversation.

## 2021-11-01 ENCOUNTER — Telehealth: Payer: Self-pay

## 2021-11-01 ENCOUNTER — Telehealth: Payer: Self-pay | Admitting: Internal Medicine

## 2021-11-01 NOTE — Telephone Encounter (Signed)
Sent in-basket message to authorization team for urgent auth of CT CAP.

## 2021-11-01 NOTE — Telephone Encounter (Signed)
.  Called patient to schedule appointment per 7/6 inbasket, patient is aware of date and time.

## 2021-11-05 ENCOUNTER — Inpatient Hospital Stay: Payer: Medicare PPO

## 2021-11-05 ENCOUNTER — Inpatient Hospital Stay: Payer: Medicare PPO | Admitting: Physician Assistant

## 2021-11-05 ENCOUNTER — Other Ambulatory Visit: Payer: Self-pay | Admitting: Physician Assistant

## 2021-11-05 ENCOUNTER — Telehealth: Payer: Self-pay | Admitting: Internal Medicine

## 2021-11-05 ENCOUNTER — Telehealth: Payer: Self-pay

## 2021-11-05 NOTE — Telephone Encounter (Signed)
Per Cassie PA, sent scheduling message to move pt's f/u apt with Dr. Julien Nordmann to 11/12/21. Patient aware that the appointments are in process of being rescheduled.

## 2021-11-05 NOTE — Telephone Encounter (Signed)
.  Called patient to schedule appointment per 7/11 inbasket, patient is aware of date and time.   

## 2021-11-05 NOTE — Progress Notes (Signed)
I called radiology to move her scan to first available. They are going to call her. I will send a scheduling message once this has been moved.

## 2021-11-06 ENCOUNTER — Ambulatory Visit: Payer: Medicare PPO

## 2021-11-06 ENCOUNTER — Ambulatory Visit: Payer: Medicare PPO | Admitting: Physician Assistant

## 2021-11-06 ENCOUNTER — Other Ambulatory Visit: Payer: Medicare PPO

## 2021-11-06 NOTE — Progress Notes (Signed)
[facility] OFFICE PROGRESS NOTE  Pcp, No No address on file  DIAGNOSIS: Metastatic non-small cell lung cancer initially diagnosed as stage IIIA (T3, N1/N2, M0) non-small cell lung cancer, adenocarcinoma presented with large right lower lobe lung mass with extension to the right hilum and subcarinal area diagnosed in July 2019.  She has brain metastasis in October 2020.   Biomarker Findings Tumor Mutational Burden - TMB-Intermediate (6 Muts/Mb) Microsatellite status - MS-Stable Genomic Findings For a complete list of the genes assayed, please refer to the Appendix. NRAS Q61R ARAF amplification STK11 G56W KRAS G13D MYCN amplification MCL1 amplification NKX2-1 amplification - equivocal? TP53 G245V 7 Disease relevant genes with no reportable alterations: EGFR, ALK, BRAF, MET, ERBB2, RET, ROS1   PRIOR THERAPY: 1) Course of concurrent chemoradiation with weekly carboplatin for AUC of 2 and paclitaxel 45 mg/M2.  Status post 7 cycles.  Last dose was giving 01/11/2018. 2) Consolidation treatment with immunotherapy with Imfinzi (Durvalumab) 10 mg/KG every 2 weeks.  First dose February 09, 2018.  Status post 19 cycles. 3) status post stereotactic body radiotherapy to the enlarging right supraclavicular lymphadenopathy under the care of Dr. Lisbeth Renshaw. 4) SRS to multiple brain metastasis under the care of Dr. Lisbeth Renshaw. 5) Systemic chemotherapy with carboplatin for AUC of 5, Alimta 500 mg/M2 and Keytruda 200 mg IV every 3 weeks.  First dose 08/16/2019.  Status post 35 cycles.  Starting from cycle #5 the patient is on maintenance treatment with Alimta and Keytruda every 3 weeks. Alimta was reduced to 400 mg/m2 starting from cycle #21.  Starting from cycle #30 her treatment was changed to single agent Keytruda every 3 weeks.  Alimta was discontinued secondary to toxicity. Beryle Flock was discontinued after cycle 35 due to completing two years of therapy.  CURRENT THERAPY: Observation  INTERVAL  HISTORY: [name] 66 y.o. female returns to the clinic today for a follow-up visit.  The patient was last seen in the clinic on 09/04/2021.  At that point in time, the patient had been endorsing worsening fatigue, cytopenias, dyspnea on exertion, and overall deconditioning.  Alimta was discontinued starting from cycle #30 due to toxicity.  She then completed 35 cycles of Keytruda and she was on her observation since May 2023.  Since last being seen, the patient presented to the emergency room on 10/30/2021 for the chief complaint of dizziness and unsteady gait with difficulties with ambulation 2 days prior to her ER visit, which acutely worsened the day of her visit.  She had a brain MRI which showed stable brain lesions without any significant swelling.  She received a steroid injection while in the emergency room.  She also had stable but persistent anemia with hemoglobin of 10.7.   Dr. Julien Nordmann recommended moving up her restaging CT scan of her chest, abdomen, and pelvis which was supposed to be in August 2023.  Since being seen in the emergency room, the patient does continue to have some balance issues but states it has been gradually improving but has not gotten back to her baseline completely.  She reports the dizziness and balance changes which is not dependent on her position.  She can experience the symptoms whether she is laying, rolling, sitting, or standing up.  She denies any recent associated falls.  She denies any headaches, vision changes, nausea, vomiting.  She is reporting some "popping" in her left ear.  She reports her energy is somewhat better than when she was seen previously.  She previously had a Pleurx  catheter which was removed.  She denies any fever, chills, night sweats, or unexplained weight loss.  She denies any significant dyspnea on exertion since she is not very active at home.  She is on 2 L of supplemental oxygen.  She reports her cough is "better" than it had been  previously.  She denies any chest pain or hemoptysis.  Denies any diarrhea or constipation.  She recently had a restaging CT scan of the chest, abdomen, and pelvis.  She is here today for evaluation and repeat blood work for more detailed discussion about her current condition and recommended treatment options.      MEDICAL HISTORY: Past Medical History:  Diagnosis Date   Acid reflux 09/21/2018   Adenocarcinoma of right lung, stage 4 ([place]) 11/17/2017   Allergic rhinitis 08/22/2013   ANA positive 05/29/2016   Aortic atherosclerosis ([place]) 10/23/2020   Brain metastases 03/29/2019   COPD (chronic obstructive pulmonary disease) (HCC)    Coronary artery calcification seen on CT scan 10/23/2020   DDD (degenerative disc disease), cervical 05/29/2016   Dyspnea    Encounter for antineoplastic chemotherapy 11/17/2017   Encounter for antineoplastic immunotherapy 02/01/2018   Goals of care, counseling/discussion 11/17/2017   High risk medication use 05/29/2016   04/29/2016: ==> plq 200 am & 100 qhs(adeq response).   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[place]) 11/04/2018   Other fatigue 05/29/2016   Pericardial effusion 10/23/2020   Primary malignant neoplasm of bronchus of right lower lobe ([place]) 12/21/2017   Rheumatoid arthritis ([place])    Rheumatoid arthritis involving multiple sites with positive rheumatoid factor ([place]) 05/29/2016   +RF +ANA +CCP    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   Smoker 05/29/2016   Trigger finger, left ring finger 05/29/2016   Trigger finger, right ring finger 05/29/2016   Vitamin D deficiency 05/29/2016    ALLERGIES:  has No Known Allergies.  MEDICATIONS:  Current Outpatient Medications  Medication Sig Dispense Refill   acetaminophen (TYLENOL) 325 MG tablet Take 2 tablets (650 mg total)  by mouth every 6 (six) hours. (Patient taking differently: Take 650 mg by mouth every 6 (six) hours as needed for moderate pain.)     folic acid (FOLVITE) 1 MG tablet Take 1 mg by mouth daily.     hydroxychloroquine (PLAQUENIL) 200 MG tablet TAKE 1 TABLET BY MOUTH TWICE DAILY MONDAY THROUGH FRIDAY ONLY. (Patient taking differently: Take 200 mg by mouth See admin instructions. Take 1 tablet by mouth twice daily Monday through Friday only) 120 tablet 0   omeprazole (PRILOSEC) 20 MG capsule Take 20 mg by mouth daily.     prochlorperazine (COMPAZINE) 10 MG tablet TAKE 1 TABLET(10 MG) BY MOUTH EVERY 6 HOURS AS NEEDED FOR NAUSEA OR VOMITING (Patient taking differently: Take 10 mg by mouth every 8 (eight) hours as needed for vomiting or nausea.) 30 tablet 2   vitamin C (ASCORBIC ACID) 500 MG tablet Take 500 mg by mouth daily.     VITAMIN D PO Take 1 tablet by mouth daily.     No current facility-administered medications for this visit.    SURGICAL HISTORY:  Past Surgical History:  Procedure Laterality Date   BRONCHIAL NEEDLE ASPIRATION BIOPSY  11/09/2017   Procedure: BRONCHIAL NEEDLE ASPIRATION BIOPSIES;  Surgeon: Marshell Garfinkel, MD;  Location:  [facility];  Service: Cardiopulmonary;;   CHEST TUBE INSERTION Left 05/13/2021   Procedure: INSERTION PLEURAL DRAINAGE CATHETER;  Surgeon: Lajuana Matte, MD;  Location: [facility];  Service: Thoracic;  Laterality: Left;   ENDOBRONCHIAL ULTRASOUND Bilateral 11/09/2017   Procedure: ENDOBRONCHIAL ULTRASOUND;  Surgeon: Marshell Garfinkel, MD;  Location: [facility];  Service: Cardiopulmonary;  Laterality: Bilateral;   NASAL SINUS SURGERY     NASAL SINUS SURGERY     THORACENTESIS Right 02/07/2021   Procedure: THORACENTESIS;  Surgeon: Lajuana Matte, MD;  Location: [facility];  Service: Thoracic;  Laterality: Right;   TUBAL LIGATION     VIDEO BRONCHOSCOPY  11/09/2017   Procedure: VIDEO BRONCHOSCOPY;  Surgeon: Marshell Garfinkel, MD;  Location: [facility];   Service: Cardiopulmonary;;   XI ROBOTIC ASSISTED PERICARDIAL WINDOW Left 02/07/2021   Procedure: XI ROBOTIC ASSISTED THORACOSCOPY PERICARDIAL WINDOW;  Surgeon: Lajuana Matte, MD;  Location: [facility];  Service: Thoracic;  Laterality: Left;    REVIEW OF SYSTEMS:   Review of Systems  Constitutional: Positive for improving fatigue.  Negative for appetite change, chills, fatigue, fever and unexpected weight change.  HENT: Positive for "popping" in the left ear.  Negative for mouth sores, nosebleeds, sore throat and trouble swallowing.   Eyes: Negative for eye problems and icterus.  Respiratory: Positive for improving cough and dyspnea.  Negative for cough, hemoptysis, shortness of breath and wheezing.   Cardiovascular: Negative for chest pain and leg swelling.  Gastrointestinal: Negative for abdominal pain, constipation, diarrhea, nausea and vomiting.  Genitourinary: Negative for bladder incontinence, difficulty urinating, dysuria, frequency and hematuria.   Musculoskeletal: Negative for back pain, gait problem, neck pain and neck stiffness.  Skin: Negative for itching and rash.  Neurological: Positive for persistent but improving dizziness.  Negative for extremity weakness, gait problem, headaches, light-headedness and seizures.  Hematological: Negative for adenopathy. Does not bruise/bleed easily.  Psychiatric/Behavioral: Negative for confusion, depression and sleep disturbance. The patient is not nervous/anxious.     PHYSICAL EXAMINATION:  Blood pressure (!) 115/59, pulse 100, temperature 97.9 F (36.6 C), temperature source Oral, resp. rate 17, height 5' (1.524 m), weight 110 lb 14.4 oz (50.3 kg), last menstrual period 04/28/2000, SpO2 100 %.  ECOG PERFORMANCE STATUS: 2-3  Physical Exam  Constitutional: Oriented to person, place, and time and cachectic appearing female and in no distress.  HENT:  Head: Normocephalic and atraumatic.  Mouth/Throat: Oropharynx is clear and moist. No  oropharyngeal exudate.  Eyes: Conjunctivae are normal. Right eye exhibits no discharge. Left eye exhibits no discharge. No scleral icterus.  Neck: Normal range of motion. Neck supple.  Cardiovascular: Normal rate, regular rhythm, normal heart sounds and intact distal pulses.   Pulmonary/Chest: Effort normal.  Quiet breath sounds in right lung.  No respiratory distress. No wheezes. No rales. On supplemental oxygen.  Abdominal: Soft. Bowel sounds are normal. Exhibits no distension and no mass. There is no tenderness.  Musculoskeletal: Normal range of motion. Exhibits no edema.  Lymphadenopathy:    No cervical adenopathy.  Neurological: Alert and oriented to person, place, and time. Exhibits muscle wasting.  The patient is examined in the wheelchair.   Skin: Skin is warm and dry. No rash noted. Not diaphoretic. No erythema. No pallor.  Psychiatric: Mood, memory and judgment normal.  Vitals reviewed.  LABORATORY DATA: Lab Results  Component Value Date   WBC 6.7 11/11/2021   HGB 10.0 (L) 11/11/2021   HCT 32.1 (L) 11/11/2021   MCV 95.8 11/11/2021   PLT 264 11/11/2021  Chemistry      Component Value Date/Time   NA 138 11/11/2021 1226   K 4.1 11/11/2021 1226   CL 100 11/11/2021 1226   CO2 35 (H) 11/11/2021 1226   BUN 21 11/11/2021 1226   CREATININE 1.19 (H) 11/11/2021 1226   CREATININE 0.80 07/24/2017 1438      Component Value Date/Time   CALCIUM 11.2 (H) 11/11/2021 1226   ALKPHOS 96 11/11/2021 1226   AST 14 (L) 11/11/2021 1226   ALT 5 11/11/2021 1226   BILITOT 0.3 11/11/2021 1226       RADIOGRAPHIC STUDIES:  CT Chest W Contrast  Result Date: 11/12/2021 CLINICAL DATA:  Restaging non-small cell lung cancer. * Tracking Code: BO * EXAM: CT CHEST, ABDOMEN, AND PELVIS WITH CONTRAST TECHNIQUE: Multidetector CT imaging of the chest, abdomen and pelvis was performed following the standard protocol during bolus administration of intravenous contrast. RADIATION DOSE REDUCTION: This  exam was performed according to the departmental dose-optimization program which includes automated exposure control, adjustment of the mA and/or kV according to patient size and/or use of iterative reconstruction technique. CONTRAST:  112m OMNIPAQUE IOHEXOL 300 MG/ML  SOLN COMPARISON:  Prior examinations 07/23/2021 and 04/24/2021. FINDINGS: CT CHEST FINDINGS Cardiovascular: No acute vascular findings are evident. There is atherosclerosis of the aorta, great vessels and coronary arteries. The heart size is normal. There is no pericardial effusion. Mediastinum/Nodes: Similar soft tissue thickening around the right hilum attributed to prior radiation therapy. No discretely enlarged mediastinal, hilar or axillary lymph nodes are identified. The thyroid gland, trachea and esophagus demonstrate no significant findings. Lungs/Pleura: Chronic moderate-sized right pleural effusion is similar in volume to the most recent study with mild associated diffuse pleural thickening. No definite pleural based masses are identified. There is trace pleural fluid on the left. Previously demonstrated left chest tube has been removed. Underlying moderate centrilobular and paraseptal emphysema with stable radiation changes in the right perihilar region. There is associated perihilar fibrosis with volume loss and architectural distortion. Bandlike opacities in the left lower lobe have mildly increased in the interval. No focal airspace disease or suspicious pulmonary nodularity identified. Musculoskeletal/Chest wall: No chest wall mass or suspicious osseous lesions. Focal sclerosis anteriorly in the right 7th rib appears unchanged. CT ABDOMEN AND PELVIS FINDINGS Hepatobiliary: The liver is normal in density without suspicious focal abnormality. No evidence of gallstones, gallbladder wall thickening or biliary dilatation. Pancreas: Unremarkable. No pancreatic ductal dilatation or surrounding inflammatory changes. Spleen: Normal in size  without focal abnormality. Adrenals/Urinary Tract: Both adrenal glands appear normal. The kidneys appear unchanged, without evidence of urinary tract calculus, hydronephrosis or suspicious focal lesion. The bladder appears normal for its degree of distention. Stomach/Bowel: Enteric contrast was administered and has passed into the proximal colon. The stomach appears unremarkable for its degree of distension. No evidence of bowel wall thickening, distention or surrounding inflammatory change. The appendix appears normal. Vascular/Lymphatic: There are no enlarged abdominal or pelvic lymph nodes. Aortic and branch vessel atherosclerosis without acute vascular findings or aneurysm. Reproductive: The uterus and ovaries appear unremarkable. No adnexal mass. Other: No evidence of abdominal wall mass or hernia. No ascites. Musculoskeletal: No acute or significant osseous findings. IMPRESSION: 1. Interval increased streaky, band-like opacity in the left lower lobe, favoring atelectasis or post radiation change. 2. Otherwise stable CTs of the chest, abdomen and pelvis compared with previous study of 4 months ago. 3. A moderate-sized right pleural effusion appears unchanged with mild pleural thickening, but no discrete pleural based masses. 4. Stable treatment  changes in the right lung with perihilar scarring, volume loss and architectural distortion. 5. No evidence of metastatic disease in the abdomen or pelvis. 6. Coronary and aortic atherosclerosis (ICD10-I70.0). Emphysema (ICD10-J43.9). Electronically Signed   By: Richardean Sale M.D.   On: 11/12/2021 08:33   CT Abdomen Pelvis W Contrast  Result Date: 11/12/2021 CLINICAL DATA:  Restaging non-small cell lung cancer. * Tracking Code: BO * EXAM: CT CHEST, ABDOMEN, AND PELVIS WITH CONTRAST TECHNIQUE: Multidetector CT imaging of the chest, abdomen and pelvis was performed following the standard protocol during bolus administration of intravenous contrast. RADIATION DOSE  REDUCTION: This exam was performed according to the departmental dose-optimization program which includes automated exposure control, adjustment of the mA and/or kV according to patient size and/or use of iterative reconstruction technique. CONTRAST:  149m OMNIPAQUE IOHEXOL 300 MG/ML  SOLN COMPARISON:  Prior examinations 07/23/2021 and 04/24/2021. FINDINGS: CT CHEST FINDINGS Cardiovascular: No acute vascular findings are evident. There is atherosclerosis of the aorta, great vessels and coronary arteries. The heart size is normal. There is no pericardial effusion. Mediastinum/Nodes: Similar soft tissue thickening around the right hilum attributed to prior radiation therapy. No discretely enlarged mediastinal, hilar or axillary lymph nodes are identified. The thyroid gland, trachea and esophagus demonstrate no significant findings. Lungs/Pleura: Chronic moderate-sized right pleural effusion is similar in volume to the most recent study with mild associated diffuse pleural thickening. No definite pleural based masses are identified. There is trace pleural fluid on the left. Previously demonstrated left chest tube has been removed. Underlying moderate centrilobular and paraseptal emphysema with stable radiation changes in the right perihilar region. There is associated perihilar fibrosis with volume loss and architectural distortion. Bandlike opacities in the left lower lobe have mildly increased in the interval. No focal airspace disease or suspicious pulmonary nodularity identified. Musculoskeletal/Chest wall: No chest wall mass or suspicious osseous lesions. Focal sclerosis anteriorly in the right 7th rib appears unchanged. CT ABDOMEN AND PELVIS FINDINGS Hepatobiliary: The liver is normal in density without suspicious focal abnormality. No evidence of gallstones, gallbladder wall thickening or biliary dilatation. Pancreas: Unremarkable. No pancreatic ductal dilatation or surrounding inflammatory changes. Spleen:  Normal in size without focal abnormality. Adrenals/Urinary Tract: Both adrenal glands appear normal. The kidneys appear unchanged, without evidence of urinary tract calculus, hydronephrosis or suspicious focal lesion. The bladder appears normal for its degree of distention. Stomach/Bowel: Enteric contrast was administered and has passed into the proximal colon. The stomach appears unremarkable for its degree of distension. No evidence of bowel wall thickening, distention or surrounding inflammatory change. The appendix appears normal. Vascular/Lymphatic: There are no enlarged abdominal or pelvic lymph nodes. Aortic and branch vessel atherosclerosis without acute vascular findings or aneurysm. Reproductive: The uterus and ovaries appear unremarkable. No adnexal mass. Other: No evidence of abdominal wall mass or hernia. No ascites. Musculoskeletal: No acute or significant osseous findings. IMPRESSION: 1. Interval increased streaky, band-like opacity in the left lower lobe, favoring atelectasis or post radiation change. 2. Otherwise stable CTs of the chest, abdomen and pelvis compared with previous study of 4 months ago. 3. A moderate-sized right pleural effusion appears unchanged with mild pleural thickening, but no discrete pleural based masses. 4. Stable treatment changes in the right lung with perihilar scarring, volume loss and architectural distortion. 5. No evidence of metastatic disease in the abdomen or pelvis. 6. Coronary and aortic atherosclerosis (ICD10-I70.0). Emphysema (ICD10-J43.9). Electronically Signed   By: WRichardean SaleM.D.   On: 11/12/2021 08:33   MR Brain W and Wo  Contrast  Result Date: 10/30/2021 CLINICAL DATA:  Vertigo EXAM: MRI HEAD WITHOUT AND WITH CONTRAST MRA HEAD WITHOUT CONTRAST MRA NECK WITHOUT AND WITH CONTRAST TECHNIQUE: Multiplanar, multi-echo pulse sequences of the brain and surrounding structures were acquired without and with intravenous contrast. Angiographic images of the  Circle of Willis were acquired using MRA technique without intravenous contrast. Angiographic images of the neck were acquired using MRA technique without and with intravenous contrast. Carotid stenosis measurements (when applicable) are obtained utilizing NASCET criteria, using the distal internal carotid diameter as the denominator. CONTRAST:  15m GADAVIST GADOBUTROL 1 MMOL/ML IV SOLN COMPARISON:  Brain MRI 08/30/2021 FINDINGS: MRI HEAD FINDINGS Brain: No acute infarct, mass effect or extra-axial collection. No acute or chronic hemorrhage. There is multifocal hyperintense T2-weighted signal within the white matter. Parenchymal volume and CSF spaces are normal. The midline structures are normal. There are 4 unchanged contrast enhancing lesions: 1. Inferior left cerebellum, 7 mm.  Series 24, image 7 2. Lateral left cerebellum, 4 mm.  Image 10 3. Medial right cerebellum, 2 mm.  Image 11 4. Inferior right frontal lobe, 3 mm.  Image 21 Vascular: Major flow voids are preserved. Skull and upper cervical spine: Normal calvarium and skull base. Visualized upper cervical spine and soft tissues are normal. Sinuses/Orbits:Left mastoid effusion normal orbits. MRA HEAD FINDINGS POSTERIOR CIRCULATION: --Vertebral arteries: Normal --Inferior cerebellar arteries: Normal. --Basilar artery: Normal. --Superior cerebellar arteries: Normal. --Posterior cerebral arteries: Normal. ANTERIOR CIRCULATION: --Intracranial internal carotid arteries: Normal. --Anterior cerebral arteries (ACA): Normal. --Middle cerebral arteries (MCA): Normal. ANATOMIC VARIANTS: None MRA NECK FINDINGS Aortic arch: Normal Right carotid system: Normal Left carotid system: Normal Vertebral arteries: Codominant.  Normal Other: None IMPRESSION: 1. Unchanged appearance of 4 intraparenchymal metastases. 2. No acute intracranial abnormality. 3. Normal MRA of the head and neck. Electronically Signed   By: KUlyses JarredM.D.   On: 10/30/2021 21:16   MR ANGIO HEAD WO  CONTRAST  Result Date: 10/30/2021 CLINICAL DATA:  Vertigo EXAM: MRI HEAD WITHOUT AND WITH CONTRAST MRA HEAD WITHOUT CONTRAST MRA NECK WITHOUT AND WITH CONTRAST TECHNIQUE: Multiplanar, multi-echo pulse sequences of the brain and surrounding structures were acquired without and with intravenous contrast. Angiographic images of the Circle of Willis were acquired using MRA technique without intravenous contrast. Angiographic images of the neck were acquired using MRA technique without and with intravenous contrast. Carotid stenosis measurements (when applicable) are obtained utilizing NASCET criteria, using the distal internal carotid diameter as the denominator. CONTRAST:  572mGADAVIST GADOBUTROL 1 MMOL/ML IV SOLN COMPARISON:  Brain MRI 08/30/2021 FINDINGS: MRI HEAD FINDINGS Brain: No acute infarct, mass effect or extra-axial collection. No acute or chronic hemorrhage. There is multifocal hyperintense T2-weighted signal within the white matter. Parenchymal volume and CSF spaces are normal. The midline structures are normal. There are 4 unchanged contrast enhancing lesions: 1. Inferior left cerebellum, 7 mm.  Series 24, image 7 2. Lateral left cerebellum, 4 mm.  Image 10 3. Medial right cerebellum, 2 mm.  Image 11 4. Inferior right frontal lobe, 3 mm.  Image 21 Vascular: Major flow voids are preserved. Skull and upper cervical spine: Normal calvarium and skull base. Visualized upper cervical spine and soft tissues are normal. Sinuses/Orbits:Left mastoid effusion normal orbits. MRA HEAD FINDINGS POSTERIOR CIRCULATION: --Vertebral arteries: Normal --Inferior cerebellar arteries: Normal. --Basilar artery: Normal. --Superior cerebellar arteries: Normal. --Posterior cerebral arteries: Normal. ANTERIOR CIRCULATION: --Intracranial internal carotid arteries: Normal. --Anterior cerebral arteries (ACA): Normal. --Middle cerebral arteries (MCA): Normal. ANATOMIC VARIANTS: None MRA NECK FINDINGS Aortic  arch: Normal Right carotid  system: Normal Left carotid system: Normal Vertebral arteries: Codominant.  Normal Other: None IMPRESSION: 1. Unchanged appearance of 4 intraparenchymal metastases. 2. No acute intracranial abnormality. 3. Normal MRA of the head and neck. Electronically Signed   By: Ulyses Jarred M.D.   On: 10/30/2021 21:16   MR ANGIO NECK W WO CONTRAST  Result Date: 10/30/2021 CLINICAL DATA:  Vertigo EXAM: MRI HEAD WITHOUT AND WITH CONTRAST MRA HEAD WITHOUT CONTRAST MRA NECK WITHOUT AND WITH CONTRAST TECHNIQUE: Multiplanar, multi-echo pulse sequences of the brain and surrounding structures were acquired without and with intravenous contrast. Angiographic images of the Circle of Willis were acquired using MRA technique without intravenous contrast. Angiographic images of the neck were acquired using MRA technique without and with intravenous contrast. Carotid stenosis measurements (when applicable) are obtained utilizing NASCET criteria, using the distal internal carotid diameter as the denominator. CONTRAST:  61m GADAVIST GADOBUTROL 1 MMOL/ML IV SOLN COMPARISON:  Brain MRI 08/30/2021 FINDINGS: MRI HEAD FINDINGS Brain: No acute infarct, mass effect or extra-axial collection. No acute or chronic hemorrhage. There is multifocal hyperintense T2-weighted signal within the white matter. Parenchymal volume and CSF spaces are normal. The midline structures are normal. There are 4 unchanged contrast enhancing lesions: 1. Inferior left cerebellum, 7 mm.  Series 24, image 7 2. Lateral left cerebellum, 4 mm.  Image 10 3. Medial right cerebellum, 2 mm.  Image 11 4. Inferior right frontal lobe, 3 mm.  Image 21 Vascular: Major flow voids are preserved. Skull and upper cervical spine: Normal calvarium and skull base. Visualized upper cervical spine and soft tissues are normal. Sinuses/Orbits:Left mastoid effusion normal orbits. MRA HEAD FINDINGS POSTERIOR CIRCULATION: --Vertebral arteries: Normal --Inferior cerebellar arteries: Normal.  --Basilar artery: Normal. --Superior cerebellar arteries: Normal. --Posterior cerebral arteries: Normal. ANTERIOR CIRCULATION: --Intracranial internal carotid arteries: Normal. --Anterior cerebral arteries (ACA): Normal. --Middle cerebral arteries (MCA): Normal. ANATOMIC VARIANTS: None MRA NECK FINDINGS Aortic arch: Normal Right carotid system: Normal Left carotid system: Normal Vertebral arteries: Codominant.  Normal Other: None IMPRESSION: 1. Unchanged appearance of 4 intraparenchymal metastases. 2. No acute intracranial abnormality. 3. Normal MRA of the head and neck. Electronically Signed   By: KUlyses JarredM.D.   On: 10/30/2021 21:16   CT HEAD WO CONTRAST  Result Date: 10/30/2021 CLINICAL DATA:  Dizziness, persistent/recurrent, cardiac or vascular cause suspected Brain metastases suspected EXAM: CT HEAD WITHOUT CONTRAST TECHNIQUE: Contiguous axial images were obtained from the base of the skull through the vertex without intravenous contrast. RADIATION DOSE REDUCTION: This exam was performed according to the departmental dose-optimization program which includes automated exposure control, adjustment of the mA and/or kV according to patient size and/or use of iterative reconstruction technique. COMPARISON:  MRI May 5, 23. FINDINGS: Brain: Known treated brain metastases are not well characterized on this noncontrast head CT. MRI with contrast could better assess for progression if clinically warranted. No evidence of acute large vascular territory infarct, acute hemorrhage, midline shift, hydrocephalus, or visible extra-axial fluid collection. Partially empty sella. Vascular: No hyperdense vessel identified. Skull: No acute fracture. Sinuses/Orbits: Visualized sinuses are clear. Other: No mastoid effusions. IMPRESSION: 1. Known treated brain metastases are not well characterized on this noncontrast head CT. MRI with contrast could better assess for progression if clinically warranted. 2. Otherwise, no  evidence of new/interval acute intracranial abnormality. Electronically Signed   By: FMargaretha SheffieldM.D.   On: 10/30/2021 14:53   DG Chest 2 View  Result Date: 10/18/2021 CLINICAL DATA:  Lung mass EXAM:  CHEST - 2 VIEW COMPARISON:  May 14, 2021 chest x-ray FINDINGS: There is a moderate to large right pleural effusion with underlying opacity. There is a small left effusion. There is mild opacity in left base. No pneumothorax. No nodules or masses. The cardiomediastinal silhouette is stable. No other acute abnormalities. IMPRESSION: 1. There is a moderate to large right pleural effusion with underlying opacity. 2. There is a small left effusion. 3. There is opacity in the left base which could represent atelectasis or infiltrate/pneumonia. Recommend clinical correlation. Electronically Signed   By: Dorise Bullion III M.D.   On: 10/18/2021 10:21     ASSESSMENT/PLAN:  This is a very pleasant 66 year old African American female with metastatic non-small cell lung cancer, adenocarcinoma initially diagnosed as IIIA in July 2019. She had evidence of metastatic disease with brain metastases in October 2020. She has no actionable mutations.    She is status post 7 cycles of concurrent chemoradiation with carboplatin for an AUC of 2 and paclitaxel 45 mg/m2. She had a partial response.   She then underwent consolidation immunotherapy with imfinzi. She is status post 18 cycles.   She completed SBRT to the right supraclavicular lymph node.   She had evidence of multiple brain metastases in October 2020. She underwent SRS treatment to these lesions under the care of Dr. Lisbeth Renshaw.   She then underwent systemic chemotherapy with carboplatin for an AUC of 5, Alimta 500 mg/m2 and Keytruda 200 mg IV every 3 weeks. She is status post 35 cycles. Starting from cycle #5 she started maintenance Alimta and Keytruda. Starting from cycle #21, Dr. Julien Nordmann reduced her alimta to 400 mg/m2 due to fatigue.  Alimta was  ultimately discontinued starting from cycle #30 due to persistent anemia/cytopenias despite not having chemotherapy in several weeks. She completed two years of Keytruda in April 2023.   The patient was recently seen in the emergency room for the chief complaint of dizziness, ataxia, and unsteady gait.  The patient had an MRI which showed stable brain lesions without any significant swelling.  He was given a steroid injection.  Overall, the patient's dizziness is improving but not back to her baseline.  The patient had a restaging CT scan of the chest, abdomen, and pelvis.  The patient was seen with Dr. Julien Nordmann today.  Dr. Julien Nordmann personally independently reviewed the scan discussed results with the patient today.  The scan showed no evidence of disease progression  Dr. Julien Nordmann recommends that the patient continue on observation.  We will arrange for restaging CT scan of the chest, abdomen, and pelvis in 3 months.  For [facility] discussed her dizziness is likely multifactorial secondary to anemia and possibly ear issues and she is describing a "popping sensation in her ear.  No obvious abnormalities noted on exam of the tympanic membrane today.  Some of the view was obstructed by wax but what was visualized of the tympanic membrane appeared normal.  The patient's calcium is slightly elevated.  The patient was encouraged to decrease her intake of calcium rich food and hydrate more.  Dr. Julien Nordmann recommended that the patient take a multivitamin, preferably without calcium, for her anemia.  If the patient does not have any further improvement in her dizziness, Dr. Julien Nordmann recommends that the patient reach out to her PCP for further evaluation or possible referral to ENT if they feel that is necessary.  The patient was advised to call immediately if she has any concerning symptoms in the interval. The patient  voices understanding of current disease status and treatment options and is in agreement with  the current care plan. All questions were answered. The patient knows to call the clinic with any problems, questions or concerns. We can certainly see the patient much sooner if necessary           Orders Placed This Encounter  Procedures   CBC with Differential ([place] Only)    Standing Status:   Future    Standing Expiration Date:   11/13/2022   CMP ([place] only)    Standing Status:   Future    Standing Expiration Date:   11/13/2022       Susan Sos Jarold Macomber, PA-C 11/12/21  ADDENDUM: Hematology/Oncology Attending: I had a face-to-face encounter with the patient today.  I reviewed her records, lab, scan and recommended her care plan.  This is a very pleasant 66 years old African-American female with metastatic non-small cell lung cancer, adenocarcinoma that was initially diagnosed as a stage III in July 2019 with no actionable mutations.  The patient initially started treatment with concurrent chemoradiation with weekly carboplatin and paclitaxel followed by consolidation treatment with immunotherapy every 2 weeks status post 19 cycles.  This was discontinued after the patient developed disease progression with enlarging right supraclavicular lymphadenopathy status post SBRT followed by [facility] to brain metastasis under the care of Dr. Lisbeth Renshaw.  The patient started systemic chemotherapy with carboplatin, Alimta and Keytruda for 4 cycles followed by maintenance treatment with Alimta and Keytruda for total of 35 cycles.  Starting from cycle #30 she was treated with single agent Keytruda and Alimta was discontinued secondary to toxicity.   The patient completed 2 years of treatment with immunotherapy and it was discontinued several months ago and she is currently on observation.  She presented to the emergency department recently complaining of dizzy spells and unsteady gait with difficulty ambulating.  She had brain MRI that showed a stable brain metastasis with no  significant edema.  The patient received steroid injection in the emergency department and she felt much better.  We moved her appointment sooner than August and she had repeat CT scan of the chest, abdomen and pelvis performed yesterday. I personally and independently reviewed the scan images and discussed the result with the patient today. Her scan showed no concerning finding for disease progression and she continues to have stable disease. I recommended for the patient to continue on observation with repeat CT scan of the chest, abdomen and pelvis in 3 months. Regarding the dizzy spells she is feeling much better but she continues to have mild anemia that may be contributing to her condition.  She may also have some inner ear problem and I recommended for her to see her primary care physician or ENT for further evaluation. The patient was also encouraged to increase her hydration and to continue with oral iron tablet and multivitamins for her anemia. The patient was advised to call immediately if she has any other concerning symptoms in the interval. The total time spent in the appointment was 30 minutes. Disclaimer: This note was dictated with voice recognition software. Similar sounding words can inadvertently be transcribed and may be missed upon review. Eilleen Kempf, MD

## 2021-11-11 ENCOUNTER — Inpatient Hospital Stay: Payer: Medicare PPO | Attending: Internal Medicine

## 2021-11-11 ENCOUNTER — Other Ambulatory Visit: Payer: Self-pay

## 2021-11-11 ENCOUNTER — Ambulatory Visit (HOSPITAL_COMMUNITY)
Admission: RE | Admit: 2021-11-11 | Discharge: 2021-11-11 | Disposition: A | Discharge status: Home / Self Care | Payer: Medicare PPO | Source: Ambulatory Visit | Attending: Internal Medicine | Admitting: Internal Medicine

## 2021-11-11 DIAGNOSIS — I700 Atherosclerosis of aorta: Secondary | ICD-10-CM | POA: Diagnosis not present

## 2021-11-11 DIAGNOSIS — Z923 Personal history of irradiation: Secondary | ICD-10-CM | POA: Insufficient documentation

## 2021-11-11 DIAGNOSIS — C3491 Malignant neoplasm of unspecified part of right bronchus or lung: Secondary | ICD-10-CM | POA: Diagnosis not present

## 2021-11-11 DIAGNOSIS — C7931 Secondary malignant neoplasm of brain: Secondary | ICD-10-CM | POA: Insufficient documentation

## 2021-11-11 DIAGNOSIS — C3490 Malignant neoplasm of unspecified part of unspecified bronchus or lung: Secondary | ICD-10-CM | POA: Diagnosis not present

## 2021-11-11 DIAGNOSIS — J438 Other emphysema: Secondary | ICD-10-CM | POA: Diagnosis not present

## 2021-11-11 DIAGNOSIS — Z08 Encounter for follow-up examination after completed treatment for malignant neoplasm: Secondary | ICD-10-CM | POA: Diagnosis not present

## 2021-11-11 DIAGNOSIS — R918 Other nonspecific abnormal finding of lung field: Secondary | ICD-10-CM | POA: Diagnosis not present

## 2021-11-11 DIAGNOSIS — J984 Other disorders of lung: Secondary | ICD-10-CM | POA: Insufficient documentation

## 2021-11-11 DIAGNOSIS — I2510 Atherosclerotic heart disease of native coronary artery without angina pectoris: Secondary | ICD-10-CM | POA: Diagnosis not present

## 2021-11-11 DIAGNOSIS — C3431 Malignant neoplasm of lower lobe, right bronchus or lung: Secondary | ICD-10-CM | POA: Insufficient documentation

## 2021-11-11 DIAGNOSIS — J432 Centrilobular emphysema: Secondary | ICD-10-CM | POA: Insufficient documentation

## 2021-11-11 DIAGNOSIS — Z79899 Other long term (current) drug therapy: Secondary | ICD-10-CM | POA: Insufficient documentation

## 2021-11-11 DIAGNOSIS — J929 Pleural plaque without asbestos: Secondary | ICD-10-CM | POA: Diagnosis not present

## 2021-11-11 DIAGNOSIS — D649 Anemia, unspecified: Secondary | ICD-10-CM | POA: Insufficient documentation

## 2021-11-11 DIAGNOSIS — J90 Pleural effusion, not elsewhere classified: Secondary | ICD-10-CM | POA: Diagnosis not present

## 2021-11-11 DIAGNOSIS — Z87891 Personal history of nicotine dependence: Secondary | ICD-10-CM | POA: Insufficient documentation

## 2021-11-11 LAB — CMP (CANCER CENTER ONLY)
ALT: 5 U/L (ref 0–44)
AST: 14 U/L — ABNORMAL LOW (ref 15–41)
Albumin: 3.5 g/dL (ref 3.5–5.0)
Alkaline Phosphatase: 96 U/L (ref 38–126)
Anion gap: 3 — ABNORMAL LOW (ref 5–15)
BUN: 21 mg/dL (ref 8–23)
CO2: 35 mmol/L — ABNORMAL HIGH (ref 22–32)
Calcium: 11.2 mg/dL — ABNORMAL HIGH (ref 8.9–10.3)
Chloride: 100 mmol/L (ref 98–111)
Creatinine: 1.19 mg/dL — ABNORMAL HIGH (ref 0.44–1.00)
GFR, Estimated: 50 mL/min — ABNORMAL LOW (ref >60)
Glucose, Bld: 95 mg/dL (ref 70–99)
Potassium: 4.1 mmol/L (ref 3.5–5.1)
Sodium: 138 mmol/L (ref 135–145)
Total Bilirubin: 0.3 mg/dL (ref 0.3–1.2)
Total Protein: 7.7 g/dL (ref 6.5–8.1)

## 2021-11-11 LAB — CBC WITH DIFFERENTIAL (CANCER CENTER ONLY)
Abs Immature Granulocytes: 0.02 10*3/uL (ref 0.00–0.07)
Basophils Absolute: 0 10*3/uL (ref 0.0–0.1)
Basophils Relative: 0 %
Eosinophils Absolute: 0.1 10*3/uL (ref 0.0–0.5)
Eosinophils Relative: 1 %
HCT: 32.1 % — ABNORMAL LOW (ref 36.0–46.0)
Hemoglobin: 10 g/dL — ABNORMAL LOW (ref 12.0–15.0)
Immature Granulocytes: 0 %
Lymphocytes Relative: 12 %
Lymphs Abs: 0.8 10*3/uL (ref 0.7–4.0)
MCH: 29.9 pg (ref 26.0–34.0)
MCHC: 31.2 g/dL (ref 30.0–36.0)
MCV: 95.8 fL (ref 80.0–100.0)
Monocytes Absolute: 0.8 10*3/uL (ref 0.1–1.0)
Monocytes Relative: 11 %
Neutro Abs: 5 10*3/uL (ref 1.7–7.7)
Neutrophils Relative %: 76 %
Platelet Count: 264 10*3/uL (ref 150–400)
RBC: 3.35 MIL/uL — ABNORMAL LOW (ref 3.87–5.11)
RDW: 13.4 % (ref 11.5–15.5)
WBC Count: 6.7 10*3/uL (ref 4.0–10.5)
nRBC: 0 % (ref 0.0–0.2)

## 2021-11-11 LAB — TSH: TSH: 2.434 u[IU]/mL (ref 0.350–4.500)

## 2021-11-11 MED ORDER — SODIUM CHLORIDE (PF) 0.9 % IJ SOLN
INTRAMUSCULAR | Status: AC
  Filled 2021-11-11: qty 50

## 2021-11-11 MED ORDER — IOHEXOL 300 MG/ML  SOLN
100.0000 mL | Freq: Once | INTRAMUSCULAR | Status: AC | PRN
  Administered 2021-11-11: 100 mL via INTRAVENOUS

## 2021-11-12 ENCOUNTER — Inpatient Hospital Stay: Payer: Medicare PPO | Admitting: Physician Assistant

## 2021-11-12 ENCOUNTER — Other Ambulatory Visit: Payer: Self-pay | Admitting: *Deleted

## 2021-11-12 VITALS — BP 115/59 | HR 100 | Temp 97.9°F | Resp 17 | Ht 60.0 in | Wt 110.9 lb

## 2021-11-12 DIAGNOSIS — Z87891 Personal history of nicotine dependence: Secondary | ICD-10-CM | POA: Diagnosis not present

## 2021-11-12 DIAGNOSIS — Z79899 Other long term (current) drug therapy: Secondary | ICD-10-CM | POA: Diagnosis not present

## 2021-11-12 DIAGNOSIS — R42 Dizziness and giddiness: Secondary | ICD-10-CM | POA: Diagnosis not present

## 2021-11-12 DIAGNOSIS — C3431 Malignant neoplasm of lower lobe, right bronchus or lung: Secondary | ICD-10-CM | POA: Diagnosis not present

## 2021-11-12 DIAGNOSIS — D649 Anemia, unspecified: Secondary | ICD-10-CM | POA: Diagnosis not present

## 2021-11-12 DIAGNOSIS — C3491 Malignant neoplasm of unspecified part of right bronchus or lung: Secondary | ICD-10-CM

## 2021-11-12 DIAGNOSIS — C7931 Secondary malignant neoplasm of brain: Secondary | ICD-10-CM | POA: Diagnosis not present

## 2021-11-12 DIAGNOSIS — Z923 Personal history of irradiation: Secondary | ICD-10-CM | POA: Diagnosis not present

## 2021-11-18 ENCOUNTER — Other Ambulatory Visit: Payer: Self-pay

## 2021-11-19 ENCOUNTER — Ambulatory Visit (INDEPENDENT_AMBULATORY_CARE_PROVIDER_SITE_OTHER): Payer: Medicare PPO | Admitting: Family Medicine

## 2021-11-19 ENCOUNTER — Encounter: Payer: Self-pay | Admitting: Family Medicine

## 2021-11-19 VITALS — BP 118/74 | HR 106 | Temp 97.8°F | Wt 111.8 lb

## 2021-11-19 DIAGNOSIS — R2681 Unsteadiness on feet: Secondary | ICD-10-CM | POA: Diagnosis not present

## 2021-11-19 DIAGNOSIS — H938X2 Other specified disorders of left ear: Secondary | ICD-10-CM

## 2021-11-19 MED ORDER — FLUTICASONE PROPIONATE 50 MCG/ACT NA SUSP: 2.0000 | Freq: Every day | NASAL | 6 refills | Status: DC

## 2021-11-19 MED ORDER — PREDNISONE 20 MG PO TABS: 20.0000 mg | ORAL_TABLET | Freq: Every day | ORAL | 0 refills | Status: AC

## 2021-11-19 NOTE — Patient Instructions (Addendum)
Take prednisone tablets and fluticasone nasal spray as directed We are referring to ENT

## 2021-11-19 NOTE — Assessment & Plan Note (Signed)
Patient denies falls With ear pressure, could be ETD causing vertiginous-like symptoms or BPPV  Negative work-up recently for intracranial abnormalities associated with mass effect from metastasis, no sign of acute CVA Discussed possible treatments including steroids, physical therapy evaluation, referral to ENT Given improvement with steroids at office, it is reasonable to try low-dose prednisone 20 mg for 5 days plus fluticasone Patient declined referral to PT either home health or in person Patient also wishes to have referral to ENT, referral placed Patient follow-up as needed for ongoing balance issues

## 2021-11-19 NOTE — Progress Notes (Addendum)
Assessment/Plan:   Problem List Items Addressed This Visit       Other   Unsteady gait when walking - Primary    Patient denies falls With ear pressure, could be ETD causing vertiginous-like symptoms or BPPV  Negative work-up recently for intracranial abnormalities associated with mass effect from metastasis, no sign of acute CVA Discussed possible treatments including steroids, physical therapy evaluation, referral to ENT Given improvement with steroids at office, it is reasonable to try low-dose prednisone 20 mg for 5 days plus fluticasone Patient declined referral to PT either home health or in person Patient also wishes to have referral to ENT, referral placed Patient follow-up as needed for ongoing balance issues      Relevant Medications   fluticasone (FLONASE) 50 MCG/ACT nasal spray   Other Relevant Orders   Ambulatory referral to ENT   Other Visit Diagnoses     Ear pressure, left       Relevant Medications   fluticasone (FLONASE) 50 MCG/ACT nasal spray   Other Relevant Orders   Ambulatory referral to ENT          Subjective:  HPI:  Susan Holder is a 66 y.o. female who has Rheumatoid arthritis involving multiple sites with positive rheumatoid factor ([place]); ANA positive; Vitamin D deficiency; High risk medication use; Trigger finger, left ring finger; Trigger finger, right ring finger; DDD (degenerative disc disease), cervical; Smoker; Other fatigue; Left hand pain; Allergic rhinitis; Lung mass; Adenocarcinoma of right lung, stage 4 ([place]); Encounter for antineoplastic chemotherapy; Goals of care, counseling/discussion; Primary malignant neoplasm of bronchus of right lower lobe ([place]); Encounter for antineoplastic immunotherapy; Acid reflux; Hoarseness; 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[place]); Malignant neoplasm metastatic to brain [facility]); met lung ca; Rheumatoid arthritis ([place]); Aortic atherosclerosis ([place]); Coronary artery calcification seen on CT scan;  Pericardial effusion; S/P pericardial window creation; Malnutrition of moderate degree; Pleural effusion; Anemia; S/P Left sided pigtail catheter placement; Dizziness; and Unsteady gait when walking on their problem list..   She  has a past medical history of Acid reflux (09/21/2018), Adenocarcinoma of right lung, stage 4 (HCC) (11/17/2017), Allergic rhinitis (08/22/2013), ANA positive (05/29/2016), Aortic atherosclerosis ([place]) (10/23/2020), Brain metastases (03/29/2019), COPD (chronic obstructive pulmonary disease) ([place]), Coronary artery calcification seen on CT scan (10/23/2020), DDD (degenerative disc disease), cervical (05/29/2016), Dyspnea, Encounter for antineoplastic chemotherapy (11/17/2017), Encounter for antineoplastic immunotherapy (02/01/2018), Goals of care, counseling/discussion (11/17/2017), High risk medication use (05/29/2016), Hoarseness (03/16/2018), Left hand pain (03/06/2017), Lung mass, Malnutrition of moderate degree (02/09/2021), met lung ca (dx'd 09/2017), 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   She presents with chief complaint of Establish Care (Left Ear pain x 3 weeks. Patient states that when she woke up 3 weeks ago she felt dizzy. Patient was seen at the ED 3 weeks ago and received a steroid shot. She states that she does feel a little better) .    Dizziness  She reports new onset dizziness. She describes it as feeling unbalanced, occurs constantly, and typically lasts until sits down.  It  typically occurs when she is turning head from side to side, standing up from siting position, and walking. It is usually relieved by  sitting still and rest. She has not started new medications around the time the dizziness started.   Denies recent URI symptoms, no history of seasonal allergies,   Associated symptoms: Yes hearing loss: left ear No tinnitus  No chest discomfort No heart palpitations  No heart racing No numbness or tingling of extremities  No nausea No vomiting  No speech difficulty No visual changes    Patient history of lung cancer with brain metastasis presents as a new patient with ongoing unsteadiness.  3 weeks ago patient was seen at the emergency department for this.  Patient got MRI and MRA of head and neck.  There is no change in metastasis.  There were no other abnormalities seen on imaging including eustachian tube or otic issues.   Other testing included CBCs with anemia between 10 and 11, although this was the patient's baseline, CMP that showed elevated creatinine around 1.3, although this is also close to patient's baseline with presumed CKD, normal electrolytes, negative COVID, negative ethanol, negative UDS, negative UA.  Patient was given IV methylprednisone.  She did have some improvement of symptoms after this.  Patient was given option for observation in the hospital, or to be discharged home with walker and home health.  Patient has been using a walker at home some, however still feels unsteady on gait.  She says she has not heard anything about home health.  Patient is also since this event followed with hematology who reviewed imaging.  They felt that ongoing issues were not related to anemia or metastasis and several likely inner ear related.  Wt Readings from Last 3 Encounters:  12/10/21 115 lb 3.2 oz (52.3 kg)  12/05/21 116 lb (52.6 kg)  11/19/21 111 lb 12.8 oz (50.7 kg)    BP Readings from Last 3 Encounters:  12/10/21 118/74  12/05/21 115/73   11/19/21 118/74      Lab Results  Component Value Date   WBC 6.7 11/11/2021   HGB 10.0 (L) 11/11/2021   HCT 32.1 (L) 11/11/2021   MCV 95.8 11/11/2021   PLT 264 11/11/2021   Lab Results  Component Value Date   NA 138 11/11/2021   K 4.1 11/11/2021   CO2 35 (H) 11/11/2021   BUN 21 11/11/2021   CREATININE 1.19 (H) 11/11/2021   CALCIUM 11.2 (H) 11/11/2021   GLUCOSE 95 11/11/2021     ---------------------------------------------------------------------------------------------------   Past Surgical History:  Procedure Laterality Date   BRONCHIAL NEEDLE ASPIRATION BIOPSY  11/09/2017   Procedure: BRONCHIAL NEEDLE ASPIRATION BIOPSIES;  Surgeon: Marshell Garfinkel, MD;  Location: [facility];  Service: Cardiopulmonary;;   CHEST TUBE INSERTION Left 05/13/2021   Procedure: INSERTION PLEURAL DRAINAGE CATHETER;  Surgeon: Lajuana Matte, MD;  Location: [facility];  Service: Thoracic;  Laterality: Left;   ENDOBRONCHIAL ULTRASOUND Bilateral 11/09/2017   Procedure: ENDOBRONCHIAL ULTRASOUND;  Surgeon: Marshell Garfinkel, MD;  Location: [facility];  Service: Cardiopulmonary;  Laterality: Bilateral;   NASAL SINUS SURGERY     NASAL SINUS SURGERY     THORACENTESIS Right 02/07/2021   Procedure: THORACENTESIS;  Surgeon: Lajuana Matte, MD;  Location: [facility];  Service: Thoracic;  Laterality: Right;   TUBAL LIGATION     VIDEO BRONCHOSCOPY  11/09/2017   Procedure: VIDEO BRONCHOSCOPY;  Surgeon: Marshell Garfinkel, MD;  Location: [facility];  Service: Cardiopulmonary;;   XI ROBOTIC ASSISTED PERICARDIAL WINDOW Left 02/07/2021   Procedure: XI ROBOTIC ASSISTED THORACOSCOPY PERICARDIAL WINDOW;  Surgeon: Lajuana Matte, MD;  Location: [facility];  Service: Thoracic;  Laterality: Left;    Outpatient Medications Prior to Visit  Medication Sig Dispense Refill   acetaminophen (TYLENOL) 325 MG tablet Take 2 tablets (650 mg total) by mouth every 6 (six) hours. (Patient taking differently: Take 650 mg by mouth  every 6 (six) hours as needed for moderate pain.)     folic acid (FOLVITE) 1 MG tablet Take 1 mg by mouth daily.     omeprazole (PRILOSEC) 20 MG capsule Take 20 mg by mouth daily.     prochlorperazine (COMPAZINE) 10 MG tablet TAKE 1 TABLET(10 MG) BY MOUTH EVERY 6 HOURS AS NEEDED FOR NAUSEA OR VOMITING (Patient taking differently: Take 10 mg by mouth every 8 (eight) hours as needed for vomiting or nausea.) 30 tablet 2   vitamin C (ASCORBIC ACID) 500 MG tablet Take 500 mg by mouth daily.     VITAMIN D PO Take 1 tablet by mouth daily.     hydroxychloroquine (PLAQUENIL) 200 MG tablet TAKE 1 TABLET BY MOUTH TWICE DAILY MONDAY THROUGH FRIDAY ONLY. (Patient taking differently: Take 200 mg by mouth See admin instructions. Take 1 tablet by mouth twice daily Monday through Friday only) 120 tablet 0   No facility-administered medications prior to visit.    Family History  Problem Relation Age of Onset   Stroke Mother    Alzheimer's disease Mother    Heart disease Mother    Emphysema Father    Hypertension Brother    Heart attack Maternal Aunt    Heart failure Maternal Grandmother    Hypertension Paternal Grandmother     Social History   Socioeconomic History   Marital status: Divorced    Spouse name: Not on file   Number of children: 1   Years of education: Not on file   Highest education level: Not on file  Occupational History   Not on file  Tobacco Use   Smoking status: Former    Packs/day: 0.50    Years: 45.00    Total pack years: 22.50    Types: Cigarettes    Quit date: 09/29/2017    Years since quitting: 4.2   Smokeless tobacco: Never  Vaping Use   Vaping Use: Never used  Substance and Sexual Activity   Alcohol use: Not Currently   Drug use: No   Sexual activity: Not Currently  Other Topics Concern   Not on file  Social History Narrative   Not on file   Social Determinants of Health   Financial Resource Strain: Low Risk  (12/03/2021)   Overall Financial Resource Strain  (CARDIA)    Difficulty of Paying Living Expenses: Not hard at all  Food Insecurity: No Food Insecurity (11/06/2020)   Hunger Vital Sign    Worried About Running Out of Food in the Last Year: Never true    [place] in the Last Year: Never true  Transportation Needs: No Transportation Needs (12/03/2021)   PRAPARE - Hydrologist (Medical): No    Lack of Transportation (Non-Medical): No  Physical Activity: Inactive (12/03/2021)   Exercise Vital Sign    Days of Exercise per Week: 0 days    Minutes of Exercise per Session: 0 min  Stress: No Stress Concern Present (12/03/2021)   [place]    Feeling of Stress : Not at all  Social Connections: Unknown (12/03/2021)   Social Connection and Isolation Panel [NHANES]    Frequency of Communication with Friends and Family:  More than three times a week    Frequency of Social Gatherings with Friends and Family: Never    Attends Religious Services: More than 4 times per year    Active Member of [employer] or Organizations: Yes    Attends Archivist Meetings: More than 4 times per year    Marital Status: Not on file  Intimate Partner Violence: Not At Risk (12/03/2021)   Humiliation, Afraid, Rape, and Kick questionnaire    Fear of Current or Ex-Partner: No    Emotionally Abused: No    Physically Abused: No    Sexually Abused: No                                                                                                 Objective:  Physical Exam: BP 118/74 (BP Location: Left Arm, Patient Position: Sitting, Cuff Size: Large)   Pulse (!) 106   Temp 97.8 F (36.6 C) (Temporal)   Wt 111 lb 12.8 oz (50.7 kg)   LMP 04/28/2000   SpO2 99%   BMI 21.83 kg/m    General: No acute distress. Awake and conversant.  Eyes: Normal conjunctiva, anicteric. Round symmetric pupils.  EOMI ENT: Hearing grossly intact. No nasal discharge.  Bilateral auricles  nontender, tympanic membranes normal Neck: Neck is supple. No masses or thyromegaly.  Respiratory: Respirations are non-labored. No auditory wheezing.  Skin: Warm. No rashes or ulcers.  Psych: Alert and oriented. Cooperative, Appropriate mood and affect, Normal judgment.  CV: No cyanosis or JVD MSK: Patient in wheelchair in room, does not feel comfortable to walk, 5 of 5 strength in upper and lower extremities bilaterally Neuro: Sensation and CN II-XII grossly normal, no pronator drift, able to stand without issue, no tremor       Alesia Banda, MD, MS

## 2021-11-21 ENCOUNTER — Telehealth: Payer: Self-pay | Admitting: Internal Medicine

## 2021-11-21 NOTE — Telephone Encounter (Signed)
Scheduled per 07/18 los, patient has been called and voicemail was left.

## 2021-11-24 ENCOUNTER — Other Ambulatory Visit: Payer: Self-pay | Admitting: Physician Assistant

## 2021-11-24 DIAGNOSIS — M0579 Rheumatoid arthritis with rheumatoid factor of multiple sites without organ or systems involvement: Secondary | ICD-10-CM

## 2021-11-28 NOTE — Progress Notes (Signed)
Office Visit Note  Patient: Susan Holder             Date of Birth: November 30, 1955           MRN: [id number]             PCP: Bonnita Hollow, MD Referring: Bonnita Hollow, MD Visit Date: 12/05/2021 Occupation: @GUAROCC @  Subjective:  Medication management  History of Present Illness: Susan Holder is a 66 y.o. female history of seropositive rheumatoid arthritis and metastatic non-small cell lung cancer.  She developed pericardial and pleural effusion in January 2023.  She had a thoracostomy tube which was taken out in June 2023.  She has been followed by Dr. Julien Nordmann closely.  She has received 30 cycles of chemotherapy.  She was recently evaluated by Dr. Julien Nordmann in May 2023.  At the time the disease was a stable.  She denies any increased joint pain or joint swelling.  She states her rheumatoid arthritis is well-controlled.  The right elbow bursitis resolved.  The trigger finger is not bothering her currently.  She ran out Plaquenil this week.  She states that she has been noticing some puffiness in her right ankle joint intermittently.  Activities of Daily Living:  Patient reports morning stiffness for 0  none .   Patient Denies nocturnal pain.  Difficulty dressing/grooming: Denies Difficulty climbing stairs: Reports Difficulty getting out of chair: Denies Difficulty using hands for taps, buttons, cutlery, and/or writing: Denies  Review of Systems  Constitutional:  Positive for fatigue.  HENT:  Negative for mouth sores and mouth dryness.   Eyes:  Negative for dryness.  Respiratory:  Positive for shortness of breath.   Cardiovascular:  Negative for chest pain and palpitations.  Gastrointestinal:  Negative for blood in stool, constipation and diarrhea.  Endocrine: Negative for increased urination.  Genitourinary:  Negative for involuntary urination.  Musculoskeletal:  Positive for gait problem and joint swelling. Negative for joint pain, joint pain, myalgias, muscle  weakness, morning stiffness, muscle tenderness and myalgias.  Skin:  Negative for color change, rash, hair loss and sensitivity to sunlight.  Allergic/Immunologic: Negative for susceptible to infections.  Neurological:  Positive for dizziness. Negative for headaches.  Hematological:  Negative for swollen glands.  Psychiatric/Behavioral:  Negative for depressed mood and sleep disturbance. The patient is not nervous/anxious.     PMFS History:  Patient Active Problem List   Diagnosis Date Noted   Unsteady gait when walking 11/19/2021   Dizziness 11/12/2021   S/P Left sided pigtail catheter placement 05/13/2021   Anemia 04/23/2021   Pleural effusion 04/12/2021   Malnutrition of moderate degree 02/09/2021   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   Aortic atherosclerosis ([place]) 10/23/2020   Coronary artery calcification seen on CT scan 10/23/2020   Pericardial effusion 10/23/2020   met lung ca 10/22/2020   Rheumatoid arthritis ([place]) 10/22/2020   Malignant neoplasm metastatic to brain ([place]) 03/29/2019   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[place]) 11/04/2018   Acid reflux 09/21/2018   Hoarseness 03/16/2018   Encounter for antineoplastic immunotherapy 02/01/2018   Primary malignant neoplasm of bronchus of right lower lobe ([place]) 12/21/2017   Adenocarcinoma of right lung, stage 4 ([place]) 11/17/2017   Encounter for antineoplastic chemotherapy 11/17/2017   Goals of care, counseling/discussion 11/17/2017   Lung mass    Left hand pain 03/06/2017   Rheumatoid arthritis involving multiple sites with positive rheumatoid factor ([place]) 05/29/2016   ANA positive 05/29/2016   Vitamin D deficiency 05/29/2016  High risk medication use 05/29/2016   Trigger finger, left ring finger 05/29/2016   Trigger finger, right ring finger 05/29/2016   DDD (degenerative disc disease), cervical 05/29/2016   Smoker 05/29/2016   Other fatigue 05/29/2016   Allergic rhinitis 08/22/2013    Past Medical History:   Diagnosis Date   Acid reflux 09/21/2018   Adenocarcinoma of right lung, stage 4 ([facility]) 11/17/2017   Allergic rhinitis 08/22/2013   ANA positive 05/29/2016   Aortic atherosclerosis ([place]) 10/23/2020   Brain metastases 03/29/2019   COPD (chronic obstructive pulmonary disease) ([place])    Coronary artery calcification seen on CT scan 10/23/2020   DDD (degenerative disc disease), cervical 05/29/2016   Dyspnea    Encounter for antineoplastic chemotherapy 11/17/2017   Encounter for antineoplastic immunotherapy 02/01/2018   Goals of care, counseling/discussion 11/17/2017   High risk medication use 05/29/2016   04/29/2016: ==> plq 200 am & 100 qhs(adeq response).   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[place]) 11/04/2018   Other fatigue 05/29/2016   Pericardial effusion 10/23/2020   Primary malignant neoplasm of bronchus of right lower lobe ([place]) 12/21/2017   Rheumatoid arthritis (HCC)    Rheumatoid arthritis involving multiple sites with positive rheumatoid factor ([place]) 05/29/2016   +RF +ANA +CCP    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   Smoker 05/29/2016   Trigger finger, left ring finger 05/29/2016   Trigger finger, right ring finger 05/29/2016   Vitamin D deficiency 05/29/2016    Family History  Problem Relation Age of Onset   Stroke Mother    Alzheimer's disease Mother    Heart disease Mother    Emphysema Father    Hypertension Brother    Heart attack Maternal Aunt    Heart failure Maternal Grandmother    Hypertension Paternal Grandmother    Past Surgical History:  Procedure Laterality Date   BRONCHIAL NEEDLE ASPIRATION BIOPSY  11/09/2017   Procedure: BRONCHIAL NEEDLE ASPIRATION BIOPSIES;  Surgeon: Marshell Garfinkel, MD;  Location: [facility];  Service: Cardiopulmonary;;   CHEST TUBE INSERTION Left 05/13/2021    Procedure: INSERTION PLEURAL DRAINAGE CATHETER;  Surgeon: Lajuana Matte, MD;  Location: [facility];  Service: Thoracic;  Laterality: Left;   ENDOBRONCHIAL ULTRASOUND Bilateral 11/09/2017   Procedure: ENDOBRONCHIAL ULTRASOUND;  Surgeon: Marshell Garfinkel, MD;  Location: [facility];  Service: Cardiopulmonary;  Laterality: Bilateral;   NASAL SINUS SURGERY     NASAL SINUS SURGERY     THORACENTESIS Right 02/07/2021   Procedure: THORACENTESIS;  Surgeon: Lajuana Matte, MD;  Location: [facility];  Service: Thoracic;  Laterality: Right;   TUBAL LIGATION     VIDEO BRONCHOSCOPY  11/09/2017   Procedure: VIDEO BRONCHOSCOPY;  Surgeon: Marshell Garfinkel, MD;  Location: [facility];  Service: Cardiopulmonary;;   XI ROBOTIC ASSISTED PERICARDIAL WINDOW Left 02/07/2021   Procedure: XI ROBOTIC ASSISTED THORACOSCOPY PERICARDIAL WINDOW;  Surgeon: Lajuana Matte, MD;  Location: [facility];  Service: Thoracic;  Laterality: Left;   Social History   Social History Narrative   Not on file   Immunization History  Administered Date(s) Administered   Influenza, High Dose Seasonal PF 01/22/2019   Influenza,inj,Quad PF,6+ Mos 03/08/2018   Influenza-Unspecified 02/08/2020   PFIZER(Purple Top)SARS-COV-2 Vaccination 06/02/2019, 06/23/2019, 12/24/2019     Objective: Vital Signs: BP 115/73 (BP Location: Left Arm, Patient  Position: Sitting, Cuff Size: Normal)   Pulse (!) 108   Resp 17   Ht 5' (1.524 m)   Wt 116 lb (52.6 kg)   LMP 04/28/2000   BMI 22.65 kg/m    Physical Exam Vitals and nursing note reviewed.  Constitutional:      Appearance: She is well-developed.  HENT:     Head: Normocephalic and atraumatic.  Eyes:     Conjunctiva/sclera: Conjunctivae normal.  Cardiovascular:     Rate and Rhythm: Normal rate and regular rhythm.     Heart sounds: Normal heart sounds.  Pulmonary:     Effort: Pulmonary effort is normal.     Breath sounds: Normal breath sounds.  Abdominal:     General: Bowel sounds  are normal.     Palpations: Abdomen is soft.  Musculoskeletal:     Cervical back: Normal range of motion.  Lymphadenopathy:     Cervical: No cervical adenopathy.  Skin:    General: Skin is warm and dry.     Capillary Refill: Capillary refill takes less than 2 seconds.  Neurological:     Mental Status: She is alert and oriented to person, place, and time.  Psychiatric:        Behavior: Behavior normal.      Musculoskeletal Exam: C-spine thoracic and lumbar spine were in good range of motion.  Shoulder joints, elbow joints, wrist joints, MCPs PIPs and DIPs with good range of motion with no synovitis.  Hip joints, knee joints, ankles, MTPs and PIPs been good range of motion with no synovitis.  CDAI Exam: CDAI Score: 0.2  Patient Global: 1 mm; Provider Global: 1 mm Swollen: 0 ; Tender: 0  Joint Exam 12/05/2021   No joint exam has been documented for this visit   There is currently no information documented on the homunculus. Go to the Rheumatology activity and complete the homunculus joint exam.  Investigation: No additional findings.  Imaging: CT Chest W Contrast  Result Date: 11/12/2021 CLINICAL DATA:  Restaging non-small cell lung cancer. * Tracking Code: BO * EXAM: CT CHEST, ABDOMEN, AND PELVIS WITH CONTRAST TECHNIQUE: Multidetector CT imaging of the chest, abdomen and pelvis was performed following the standard protocol during bolus administration of intravenous contrast. RADIATION DOSE REDUCTION: This exam was performed according to the departmental dose-optimization program which includes automated exposure control, adjustment of the mA and/or kV according to patient size and/or use of iterative reconstruction technique. CONTRAST:  140m OMNIPAQUE IOHEXOL 300 MG/ML  SOLN COMPARISON:  Prior examinations 07/23/2021 and 04/24/2021. FINDINGS: CT CHEST FINDINGS Cardiovascular: No acute vascular findings are evident. There is atherosclerosis of the aorta, great vessels and coronary  arteries. The heart size is normal. There is no pericardial effusion. Mediastinum/Nodes: Similar soft tissue thickening around the right hilum attributed to prior radiation therapy. No discretely enlarged mediastinal, hilar or axillary lymph nodes are identified. The thyroid gland, trachea and esophagus demonstrate no significant findings. Lungs/Pleura: Chronic moderate-sized right pleural effusion is similar in volume to the most recent study with mild associated diffuse pleural thickening. No definite pleural based masses are identified. There is trace pleural fluid on the left. Previously demonstrated left chest tube has been removed. Underlying moderate centrilobular and paraseptal emphysema with stable radiation changes in the right perihilar region. There is associated perihilar fibrosis with volume loss and architectural distortion. Bandlike opacities in the left lower lobe have mildly increased in the interval. No focal airspace disease or suspicious pulmonary nodularity identified. Musculoskeletal/Chest wall: No chest wall mass  or suspicious osseous lesions. Focal sclerosis anteriorly in the right 7th rib appears unchanged. CT ABDOMEN AND PELVIS FINDINGS Hepatobiliary: The liver is normal in density without suspicious focal abnormality. No evidence of gallstones, gallbladder wall thickening or biliary dilatation. Pancreas: Unremarkable. No pancreatic ductal dilatation or surrounding inflammatory changes. Spleen: Normal in size without focal abnormality. Adrenals/Urinary Tract: Both adrenal glands appear normal. The kidneys appear unchanged, without evidence of urinary tract calculus, hydronephrosis or suspicious focal lesion. The bladder appears normal for its degree of distention. Stomach/Bowel: Enteric contrast was administered and has passed into the proximal colon. The stomach appears unremarkable for its degree of distension. No evidence of bowel wall thickening, distention or surrounding inflammatory  change. The appendix appears normal. Vascular/Lymphatic: There are no enlarged abdominal or pelvic lymph nodes. Aortic and branch vessel atherosclerosis without acute vascular findings or aneurysm. Reproductive: The uterus and ovaries appear unremarkable. No adnexal mass. Other: No evidence of abdominal wall mass or hernia. No ascites. Musculoskeletal: No acute or significant osseous findings. IMPRESSION: 1. Interval increased streaky, band-like opacity in the left lower lobe, favoring atelectasis or post radiation change. 2. Otherwise stable CTs of the chest, abdomen and pelvis compared with previous study of 4 months ago. 3. A moderate-sized right pleural effusion appears unchanged with mild pleural thickening, but no discrete pleural based masses. 4. Stable treatment changes in the right lung with perihilar scarring, volume loss and architectural distortion. 5. No evidence of metastatic disease in the abdomen or pelvis. 6. Coronary and aortic atherosclerosis (ICD10-I70.0). Emphysema (ICD10-J43.9). Electronically Signed   By: Richardean Sale M.D.   On: 11/12/2021 08:33   CT Abdomen Pelvis W Contrast  Result Date: 11/12/2021 CLINICAL DATA:  Restaging non-small cell lung cancer. * Tracking Code: BO * EXAM: CT CHEST, ABDOMEN, AND PELVIS WITH CONTRAST TECHNIQUE: Multidetector CT imaging of the chest, abdomen and pelvis was performed following the standard protocol during bolus administration of intravenous contrast. RADIATION DOSE REDUCTION: This exam was performed according to the departmental dose-optimization program which includes automated exposure control, adjustment of the mA and/or kV according to patient size and/or use of iterative reconstruction technique. CONTRAST:  159m OMNIPAQUE IOHEXOL 300 MG/ML  SOLN COMPARISON:  Prior examinations 07/23/2021 and 04/24/2021. FINDINGS: CT CHEST FINDINGS Cardiovascular: No acute vascular findings are evident. There is atherosclerosis of the aorta, great vessels and  coronary arteries. The heart size is normal. There is no pericardial effusion. Mediastinum/Nodes: Similar soft tissue thickening around the right hilum attributed to prior radiation therapy. No discretely enlarged mediastinal, hilar or axillary lymph nodes are identified. The thyroid gland, trachea and esophagus demonstrate no significant findings. Lungs/Pleura: Chronic moderate-sized right pleural effusion is similar in volume to the most recent study with mild associated diffuse pleural thickening. No definite pleural based masses are identified. There is trace pleural fluid on the left. Previously demonstrated left chest tube has been removed. Underlying moderate centrilobular and paraseptal emphysema with stable radiation changes in the right perihilar region. There is associated perihilar fibrosis with volume loss and architectural distortion. Bandlike opacities in the left lower lobe have mildly increased in the interval. No focal airspace disease or suspicious pulmonary nodularity identified. Musculoskeletal/Chest wall: No chest wall mass or suspicious osseous lesions. Focal sclerosis anteriorly in the right 7th rib appears unchanged. CT ABDOMEN AND PELVIS FINDINGS Hepatobiliary: The liver is normal in density without suspicious focal abnormality. No evidence of gallstones, gallbladder wall thickening or biliary dilatation. Pancreas: Unremarkable. No pancreatic ductal dilatation or surrounding inflammatory changes. Spleen: Normal in  size without focal abnormality. Adrenals/Urinary Tract: Both adrenal glands appear normal. The kidneys appear unchanged, without evidence of urinary tract calculus, hydronephrosis or suspicious focal lesion. The bladder appears normal for its degree of distention. Stomach/Bowel: Enteric contrast was administered and has passed into the proximal colon. The stomach appears unremarkable for its degree of distension. No evidence of bowel wall thickening, distention or surrounding  inflammatory change. The appendix appears normal. Vascular/Lymphatic: There are no enlarged abdominal or pelvic lymph nodes. Aortic and branch vessel atherosclerosis without acute vascular findings or aneurysm. Reproductive: The uterus and ovaries appear unremarkable. No adnexal mass. Other: No evidence of abdominal wall mass or hernia. No ascites. Musculoskeletal: No acute or significant osseous findings. IMPRESSION: 1. Interval increased streaky, band-like opacity in the left lower lobe, favoring atelectasis or post radiation change. 2. Otherwise stable CTs of the chest, abdomen and pelvis compared with previous study of 4 months ago. 3. A moderate-sized right pleural effusion appears unchanged with mild pleural thickening, but no discrete pleural based masses. 4. Stable treatment changes in the right lung with perihilar scarring, volume loss and architectural distortion. 5. No evidence of metastatic disease in the abdomen or pelvis. 6. Coronary and aortic atherosclerosis (ICD10-I70.0). Emphysema (ICD10-J43.9). Electronically Signed   By: Richardean Sale M.D.   On: 11/12/2021 08:33    Recent Labs: Lab Results  Component Value Date   WBC 6.7 11/11/2021   HGB 10.0 (L) 11/11/2021   PLT 264 11/11/2021   NA 138 11/11/2021   K 4.1 11/11/2021   CL 100 11/11/2021   CO2 35 (H) 11/11/2021   GLUCOSE 95 11/11/2021   BUN 21 11/11/2021   CREATININE 1.19 (H) 11/11/2021   BILITOT 0.3 11/11/2021   ALKPHOS 96 11/11/2021   AST 14 (L) 11/11/2021   ALT 5 11/11/2021   PROT 7.7 11/11/2021   ALBUMIN 3.5 11/11/2021   CALCIUM 11.2 (H) 11/11/2021   GFRAA >60 01/17/2020    Speciality Comments: PLQ eye exam: 03/11/2021 WNL @ [facility]. Follow up in 1 year.  Procedures:  No procedures performed Allergies: Patient has no known allergies.   Assessment / Plan:     Visit Diagnoses: Rheumatoid arthritis involving multiple sites with positive rheumatoid factor (HCC)-+RF, +anti-CCP, +ANA with nodulosis.   Patient states that she has been taking hydroxychloroquine 200 mg p.o. twice daily Monday to Friday.  She has not experienced any joint swelling.  She notices intermittent swelling in her ankle joint without any pain.  She had olecranon bursitis on her right elbow joint which resolved.  She finished chemotherapy a few months ago.  She has been followed by Dr. Vaughan Browner at closely for known small cell lung cancer stage IV.  Patient ran out of Plaquenil this week.  I will send a refill.  High risk medication use - plaquenil 200 mg 1 tablet by mouth twice daily Monday through Friday only.  PLQ eye exam: 03/11/2021.  Labs obtained on November 11, 2021 showed hemoglobin 10.0, creatinine 1.19, calcium 11.2 TSH was normal.  Labs were reviewed with the patient.  We will continue to monitor labs.  I discussed with her that we may have to reduce the dose of hydroxychloroquine if her GFR stays low.  I will also check hydroxychloroquine level with her next labs.  Olecranon bursitis, right elbow-resolved.  Acquired trigger finger of both middle fingers -she gives history of intermittent symptoms.  No flexor tendon tenosynovitis was noted today.  DDD (degenerative disc disease), cervical-she had good range of motion  without discomfort.  Adenocarcinoma of right lung, stage 3 (HCC) - Metastatic non-small cell lung cancer initially diagnosed as stage IIIA. Followed by Dr. Julien Nordmann and Cassandra Heilingoetter, PA-C.    History of vitamin D deficiency   Orders: Orders Placed This Encounter  Procedures   Hydroxychloroquine, Blood   Meds ordered this encounter  Medications   hydroxychloroquine (PLAQUENIL) 200 MG tablet    Sig: Take 1 tablet (200 mg total) by mouth See admin instructions. Take 1 tablet by mouth twice daily Monday through Friday only    Dispense:  120 tablet    Refill:  0     Follow-Up Instructions: Return in about 5 months (around 05/07/2022) for Rheumatoid arthritis.   Bo Merino, MD  Note  - This record has been created using Editor, commissioning.  Chart creation errors have been sought, but may not always  have been located. Such creation errors do not reflect on  the standard of medical care.

## 2021-12-03 ENCOUNTER — Other Ambulatory Visit: Payer: Medicare PPO

## 2021-12-03 ENCOUNTER — Ambulatory Visit (INDEPENDENT_AMBULATORY_CARE_PROVIDER_SITE_OTHER): Payer: Medicare PPO | Admitting: *Deleted

## 2021-12-03 ENCOUNTER — Ambulatory Visit (HOSPITAL_COMMUNITY): Payer: Medicare PPO

## 2021-12-03 DIAGNOSIS — Z0000 Encounter for general adult medical examination without abnormal findings: Secondary | ICD-10-CM

## 2021-12-03 NOTE — Patient Instructions (Signed)
Susan Holder , Thank you for taking time to come for your Medicare Wellness Visit. I appreciate your ongoing commitment to your health goals. Please review the following plan we discussed and let me know if I can assist you in the future.   Screening recommendations/referrals: Colonoscopy:  Mammogram: Bone Density:  Recommended yearly ophthalmology/optometry visit for glaucoma screening and checkup Recommended yearly dental visit for hygiene and checkup  Vaccinations: Influenza vaccine:  Pneumococcal vaccine:  Tdap vaccine:  Shingles vaccine:     Advanced directives:   Conditions/risks identified:   Next appointment:    Preventive Care 19 Years and Older, Female Preventive care refers to lifestyle choices and visits with your health care provider that can promote health and wellness. What does preventive care include? A yearly physical exam. This is also called an annual well check. Dental exams once or twice a year. Routine eye exams. Ask your health care provider how often you should have your eyes checked. Personal lifestyle choices, including: Daily care of your teeth and gums. Regular physical activity. Eating a healthy diet. Avoiding tobacco and drug use. Limiting alcohol use. Practicing safe sex. Taking low-dose aspirin every day. Taking vitamin and mineral supplements as recommended by your health care provider. What happens during an annual well check? The services and screenings done by your health care provider during your annual well check will depend on your age, overall health, lifestyle risk factors, and family history of disease. Counseling  Your health care provider may ask you questions about your: Alcohol use. Tobacco use. Drug use. Emotional well-being. Home and relationship well-being. Sexual activity. Eating habits. History of falls. Memory and ability to understand (cognition). Work and work Statistician. Reproductive health. Screening  You  may have the following tests or measurements: Height, weight, and BMI. Blood pressure. Lipid and cholesterol levels. These may be checked every 5 years, or more frequently if you are over 58 years old. Skin check. Lung cancer screening. You may have this screening every year starting at age 28 if you have a 30-pack-year history of smoking and currently smoke or have quit within the past 15 years. Fecal occult blood test (FOBT) of the stool. You may have this test every year starting at age 75. Flexible sigmoidoscopy or colonoscopy. You may have a sigmoidoscopy every 5 years or a colonoscopy every 10 years starting at age 23. Hepatitis C blood test. Hepatitis B blood test. Sexually transmitted disease (STD) testing. Diabetes screening. This is done by checking your blood sugar (glucose) after you have not eaten for a while (fasting). You may have this done every 1-3 years. Bone density scan. This is done to screen for osteoporosis. You may have this done starting at age 75. Mammogram. This may be done every 1-2 years. Talk to your health care provider about how often you should have regular mammograms. Talk with your health care provider about your test results, treatment options, and if necessary, the need for more tests. Vaccines  Your health care provider may recommend certain vaccines, such as: Influenza vaccine. This is recommended every year. Tetanus, diphtheria, and acellular pertussis (Tdap, Td) vaccine. You may need a Td booster every 10 years. Zoster vaccine. You may need this after age 18. Pneumococcal 13-valent conjugate (PCV13) vaccine. One dose is recommended after age 32. Pneumococcal polysaccharide (PPSV23) vaccine. One dose is recommended after age 63. Talk to your health care provider about which screenings and vaccines you need and how often you need them. This information is not intended  to replace advice given to you by your health care provider. Make sure you discuss any  questions you have with your health care provider. Document Released: 05/11/2015 Document Revised: 01/02/2016 Document Reviewed: 02/13/2015 Elsevier Interactive Patient Education  2017 [employer] Prevention in the Home Falls can cause injuries. They can happen to people of all ages. There are many things you can do to make your home safe and to help prevent falls. What can I do on the outside of my home? Regularly fix the edges of walkways and driveways and fix any cracks. Remove anything that might make you trip as you walk through a door, such as a raised step or threshold. Trim any bushes or trees on the path to your home. Use bright outdoor lighting. Clear any walking paths of anything that might make someone trip, such as rocks or tools. Regularly check to see if handrails are loose or broken. Make sure that both sides of any steps have handrails. Any raised decks and porches should have guardrails on the edges. Have any leaves, snow, or ice cleared regularly. Use sand or salt on walking paths during winter. Clean up any spills in your garage right away. This includes oil or grease spills. What can I do in the bathroom? Use night lights. Install grab bars by the toilet and in the tub and shower. Do not use towel bars as grab bars. Use non-skid mats or decals in the tub or shower. If you need to sit down in the shower, use a plastic, non-slip stool. Keep the floor dry. Clean up any water that spills on the floor as soon as it happens. Remove soap buildup in the tub or shower regularly. Attach bath mats securely with double-sided non-slip rug tape. Do not have throw rugs and other things on the floor that can make you trip. What can I do in the bedroom? Use night lights. Make sure that you have a light by your bed that is easy to reach. Do not use any sheets or blankets that are too big for your bed. They should not hang down onto the floor. Have a firm chair that has side  arms. You can use this for support while you get dressed. Do not have throw rugs and other things on the floor that can make you trip. What can I do in the kitchen? Clean up any spills right away. Avoid walking on wet floors. Keep items that you use a lot in easy-to-reach places. If you need to reach something above you, use a strong step stool that has a grab bar. Keep electrical cords out of the way. Do not use floor polish or wax that makes floors slippery. If you must use wax, use non-skid floor wax. Do not have throw rugs and other things on the floor that can make you trip. What can I do with my stairs? Do not leave any items on the stairs. Make sure that there are handrails on both sides of the stairs and use them. Fix handrails that are broken or loose. Make sure that handrails are as long as the stairways. Check any carpeting to make sure that it is firmly attached to the stairs. Fix any carpet that is loose or worn. Avoid having throw rugs at the top or bottom of the stairs. If you do have throw rugs, attach them to the floor with carpet tape. Make sure that you have a light switch at the top of the stairs and  the bottom of the stairs. If you do not have them, ask someone to add them for you. What else can I do to help prevent falls? Wear shoes that: Do not have high heels. Have rubber bottoms. Are comfortable and fit you well. Are closed at the toe. Do not wear sandals. If you use a stepladder: Make sure that it is fully opened. Do not climb a closed stepladder. Make sure that both sides of the stepladder are locked into place. Ask someone to hold it for you, if possible. Clearly mark and make sure that you can see: Any grab bars or handrails. First and last steps. Where the edge of each step is. Use tools that help you move around (mobility aids) if they are needed. These include: Canes. Walkers. Scooters. Crutches. Turn on the lights when you go into a dark area.  Replace any light bulbs as soon as they burn out. Set up your furniture so you have a clear path. Avoid moving your furniture around. If any of your floors are uneven, fix them. If there are any pets around you, be aware of where they are. Review your medicines with your doctor. Some medicines can make you feel dizzy. This can increase your chance of falling. Ask your doctor what other things that you can do to help prevent falls. This information is not intended to replace advice given to you by your health care provider. Make sure you discuss any questions you have with your health care provider. Document Released: 02/08/2009 Document Revised: 09/20/2015 Document Reviewed: 05/19/2014 Elsevier Interactive Patient Education  2017 [employer].

## 2021-12-03 NOTE — Progress Notes (Signed)
Subjective:   MELVIE PAGLIA is a 66 y.o. female who presents for Medicare Annual (Subsequent) preventive examination.  I connected with  Farrel Gobble on 12/03/21 by a telephone enabled telemedicine application and verified that I am speaking with the correct person using two identifiers.   I discussed the limitations of evaluation and management by telemedicine. The patient expressed understanding and agreed to proceed.  Patient location: home  Provider location: Tele-Health-home    Review of Systems           Objective:    There were no vitals filed for this visit. There is no height or weight on file to calculate BMI.     11/12/2021    8:22 AM 10/30/2021    1:15 PM 09/04/2021   11:23 AM 08/30/2021    2:27 PM 08/14/2021    3:15 PM 07/24/2021   11:26 AM 07/03/2021   11:42 AM  Advanced Directives  Does Patient Have a Medical Advance Directive? Yes No;Yes Yes Yes Yes Yes Yes  Type of Advance Directive Living will;Healthcare Power of Attorney  Living will;Healthcare Power of Attorney Living will;Healthcare Power of [place];Living will    Does patient want to make changes to medical advance directive?  Yes (ED - Information included in AVS)    No - Patient declined No - Patient declined  Copy of [place] in Chart? No - copy requested        Would patient like information on creating a medical advance directive? No - Patient declined    No - Patient declined      Current Medications (verified) Outpatient Encounter Medications as of 12/03/2021  Medication Sig   acetaminophen (TYLENOL) 325 MG tablet Take 2 tablets (650 mg total) by mouth every 6 (six) hours. (Patient taking differently: Take 650 mg by mouth every 6 (six) hours as needed for moderate pain.)   fluticasone (FLONASE) 50 MCG/ACT nasal spray Place 2 sprays into both nostrils daily.   folic acid (FOLVITE) 1 MG tablet Take 1 mg by mouth daily.   hydroxychloroquine  (PLAQUENIL) 200 MG tablet TAKE 1 TABLET BY MOUTH TWICE DAILY MONDAY THROUGH FRIDAY ONLY. (Patient taking differently: Take 200 mg by mouth See admin instructions. Take 1 tablet by mouth twice daily Monday through Friday only)   omeprazole (PRILOSEC) 20 MG capsule Take 20 mg by mouth daily.   prochlorperazine (COMPAZINE) 10 MG tablet TAKE 1 TABLET(10 MG) BY MOUTH EVERY 6 HOURS AS NEEDED FOR NAUSEA OR VOMITING (Patient taking differently: Take 10 mg by mouth every 8 (eight) hours as needed for vomiting or nausea.)   vitamin C (ASCORBIC ACID) 500 MG tablet Take 500 mg by mouth daily.   VITAMIN D PO Take 1 tablet by mouth daily.   No facility-administered encounter medications on file as of 12/03/2021.    Allergies (verified) Patient has no known allergies.   History: Past Medical History:  Diagnosis Date   Acid reflux 09/21/2018   Adenocarcinoma of right lung, stage 4 ([facility]) 11/17/2017   Allergic rhinitis 08/22/2013   ANA positive 05/29/2016   Aortic atherosclerosis ([facility]) 10/23/2020   Brain metastases 03/29/2019   COPD (chronic obstructive pulmonary disease) (HCC)    Coronary artery calcification seen on CT scan 10/23/2020   DDD (degenerative disc disease), cervical 05/29/2016   Dyspnea    Encounter for antineoplastic chemotherapy 11/17/2017   Encounter for antineoplastic immunotherapy 02/01/2018   Goals of care, counseling/discussion 11/17/2017   High risk  medication use 05/29/2016   04/29/2016: ==> plq 200 am & 100 qhs(adeq response).   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[place] [facility]) 11/04/2018   Other fatigue 05/29/2016   Pericardial effusion 10/23/2020   Primary malignant neoplasm of bronchus of right lower lobe ([facility]) 12/21/2017   Rheumatoid arthritis (HCC)    Rheumatoid arthritis involving multiple sites with positive  rheumatoid factor ([place]) 05/29/2016   +RF +ANA +CCP    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   Smoker 05/29/2016   Trigger finger, left ring finger 05/29/2016   Trigger finger, right ring finger 05/29/2016   Vitamin D deficiency 05/29/2016   Past Surgical History:  Procedure Laterality Date   BRONCHIAL NEEDLE ASPIRATION BIOPSY  11/09/2017   Procedure: BRONCHIAL NEEDLE ASPIRATION BIOPSIES;  Surgeon: Marshell Garfinkel, MD;  Location: [facility];  Service: Cardiopulmonary;;   CHEST TUBE INSERTION Left 05/13/2021   Procedure: INSERTION PLEURAL DRAINAGE CATHETER;  Surgeon: Lajuana Matte, MD;  Location: [facility];  Service: Thoracic;  Laterality: Left;   ENDOBRONCHIAL ULTRASOUND Bilateral 11/09/2017   Procedure: ENDOBRONCHIAL ULTRASOUND;  Surgeon: Marshell Garfinkel, MD;  Location: [facility];  Service: Cardiopulmonary;  Laterality: Bilateral;   NASAL SINUS SURGERY     NASAL SINUS SURGERY     THORACENTESIS Right 02/07/2021   Procedure: THORACENTESIS;  Surgeon: Lajuana Matte, MD;  Location: [facility];  Service: Thoracic;  Laterality: Right;   TUBAL LIGATION     VIDEO BRONCHOSCOPY  11/09/2017   Procedure: VIDEO BRONCHOSCOPY;  Surgeon: Marshell Garfinkel, MD;  Location: [facility];  Service: Cardiopulmonary;;   XI ROBOTIC ASSISTED PERICARDIAL WINDOW Left 02/07/2021   Procedure: XI ROBOTIC ASSISTED THORACOSCOPY PERICARDIAL WINDOW;  Surgeon: Lajuana Matte, MD;  Location: [facility];  Service: Thoracic;  Laterality: Left;   Family History  Problem Relation Age of Onset   Stroke Mother    Alzheimer's disease Mother    Heart disease Mother    Emphysema Father    Hypertension Brother    Heart attack Maternal Aunt    Heart failure Maternal Grandmother    Hypertension Paternal Grandmother    Social History   Socioeconomic History   Marital status: Divorced    Spouse name: Not on file   Number of children: 1   Years of education: Not on file   Highest education level: Not on file   Occupational History   Not on file  Tobacco Use   Smoking status: Former    Packs/day: 0.50    Years: 45.00    Total pack years: 22.50    Types: Cigarettes    Quit date: 09/29/2017    Years since quitting: 4.1   Smokeless tobacco: Never  Vaping Use   Vaping Use: Never used  Substance and Sexual Activity   Alcohol use: Not Currently   Drug use: No   Sexual activity: Not Currently  Other Topics Concern   Not on file  Social History Narrative   Not on file   Social Determinants of Health   Financial Resource Strain: Low Risk  (11/06/2020)   Overall Financial Resource Strain (CARDIA)    Difficulty of Paying Living Expenses: Not hard at all  Food Insecurity: No Food Insecurity (11/06/2020)   Hunger Vital Sign    Worried About Running Out of Food in the Last Year:  Never true    Ran Out of Food in the Last Year: Never true  Transportation Needs: No Transportation Needs (11/06/2020)   PRAPARE - Hydrologist (Medical): No    Lack of Transportation (Non-Medical): No  Physical Activity: Inactive (11/06/2020)   Exercise Vital Sign    Days of Exercise per Week: 0 days    Minutes of Exercise per Session: 0 min  Stress: No Stress Concern Present (11/06/2020)   [place]    Feeling of Stress : Not at all  Social Connections: Moderately Isolated (11/06/2020)   Social Connection and Isolation Panel [NHANES]    Frequency of Communication with Friends and Family: More than three times a week    Frequency of Social Gatherings with Friends and Family: Twice a week    Attends Religious Services: More than 4 times per year    Active Member of [employer] or Organizations: No    Attends Archivist Meetings: Never    Marital Status: Divorced    Tobacco Counseling Counseling given: Not Answered   Clinical Intake:                 Diabetic?  no         Activities of Daily  Living    05/13/2021    3:00 PM 05/10/2021   11:37 AM  In your present state of health, do you have any difficulty performing the following activities:  Hearing? 0   Vision? 0   Difficulty concentrating or making decisions? 0   Walking or climbing stairs? 1   Dressing or bathing? 0   Doing errands, shopping? 1 1  Comment  has to be driven    Patient Care Team: Bonnita Hollow, MD as PCP - General (Family Medicine)  Indicate any recent Medical Services you may have received from other than [facility] providers in the past year (date may be approximate).     Assessment:   This is a routine wellness examination for [place].  Hearing/Vision screen No results found.  Dietary issues and exercise activities discussed:     Goals Addressed   None    Depression Screen    11/19/2021    1:12 PM 05/11/2020   10:05 AM  PHQ 2/9 Scores  PHQ - 2 Score 0 0    Fall Risk     No data to display          [place]:  Any stairs in or around the home? No  If so, are there any without handrails? No  Home free of loose throw rugs in walkways, pet beds, electrical cords, etc? Yes  Adequate lighting in your home to reduce risk of falls? Yes   ASSISTIVE DEVICES UTILIZED TO PREVENT FALLS:  Life alert? No  Use of a cane, walker or w/c? No  Grab bars in the bathroom? No  Shower chair or bench in shower? No  Elevated toilet seat or a handicapped toilet? No   TIMED UP AND GO:  Was the test performed? No .    Cognitive Function:        Immunizations Immunization History  Administered Date(s) Administered   Influenza, High Dose Seasonal PF 01/22/2019   Influenza,inj,Quad PF,6+ Mos 03/08/2018   Influenza-Unspecified 02/08/2020   PFIZER(Purple Top)SARS-COV-2 Vaccination 06/02/2019, 06/23/2019, 12/24/2019    TDAP status: Due, Education has been provided regarding the importance of this vaccine. Advised may  receive this vaccine at local pharmacy or  Health Dept. Aware to provide a copy of the vaccination record if obtained from local pharmacy or Health Dept. Verbalized acceptance and understanding.  Flu Vaccine status: Due, Education has been provided regarding the importance of this vaccine. Advised may receive this vaccine at local pharmacy or Health Dept. Aware to provide a copy of the vaccination record if obtained from local pharmacy or Health Dept. Verbalized acceptance and understanding.  Pneumococcal vaccine status: Due, Education has been provided regarding the importance of this vaccine. Advised may receive this vaccine at local pharmacy or Health Dept. Aware to provide a copy of the vaccination record if obtained from local pharmacy or Health Dept. Verbalized acceptance and understanding.  Covid-19 vaccine status: Information provided on how to obtain vaccines.   Qualifies for Shingles Vaccine? Yes   Zostavax completed No   Shingrix Completed?: No.    Education has been provided regarding the importance of this vaccine. Patient has been advised to call insurance company to determine out of pocket expense if they have not yet received this vaccine. Advised may also receive vaccine at local pharmacy or Health Dept. Verbalized acceptance and understanding.  Screening Tests Health Maintenance  Topic Date Due   Hepatitis C Screening  Never done   COVID-19 Vaccine (4 - Booster for Pfizer series) 02/18/2020   INFLUENZA VACCINE  11/26/2021   Zoster Vaccines- Shingrix (1 of 2) 02/19/2022 (Originally 09/28/1974)   Pneumonia Vaccine 56+ Years old (1 - PCV) 11/20/2022 (Originally 09/29/1961)   MAMMOGRAM  11/20/2022 (Originally 09/29/2003)   DEXA SCAN  11/20/2022 (Originally 09/29/2018)   COLONOSCOPY (Pts 45-23yr Insurance coverage will need to be confirmed)  11/20/2022 (Originally 09/29/1998)   TETANUS/TDAP  11/20/2022 (Originally [birth year]/28/1972)   HPV VACCINES  Aged Out    Health Maintenance  Health Maintenance Due  Topic Date Due   Hepatitis C  Screening  Never done   COVID-19 Vaccine (4 - Booster for Pfizer series) 02/18/2020   INFLUENZA VACCINE  11/26/2021    Colonoscopy no records available  Mammogram due did not order at this time  Bone Density  postpone  Lung Cancer Screening: (Low Dose CT Chest recommended if Age 68-80years, 30 pack-year currently smoking OR have quit w/in 15years.) does not qualify.   Lung Cancer Screening Referral:   Additional Screening:  Hepatitis C Screening: does not qualify; Completed 2010  Vision Screening: Recommended annual ophthalmology exams for early detection of glaucoma and other disorders of the eye. Is the patient up to date with their annual eye exam?  Yes  Who is the provider or what is the name of the office in which the patient attends annual eye exams? [facility] If pt is not established with a provider, would they like to be referred to a provider to establish care? No .   Dental Screening: Recommended annual dental exams for proper oral hygiene  Community Resource Referral / Chronic Care Management: CRR required this visit?  No   CCM required this visit?  No      Plan:     I have personally reviewed and noted the following in the patient's chart:   Medical and social history Use of alcohol, tobacco or illicit drugs  Current medications and supplements including opioid prescriptions.  Functional ability and status Nutritional status Physical activity Advanced directives List of other physicians Hospitalizations, surgeries, and ER visits in previous 12 months Vitals Screenings to include cognitive, depression, and falls Referrals and appointments  In addition,  I have reviewed and discussed with patient certain preventive protocols, quality metrics, and best practice recommendations. A written personalized care plan for preventive services as well as general preventive health recommendations were provided to patient.     Leroy Kennedy, LPN   01/01/7226   Nurse  Notes:

## 2021-12-05 ENCOUNTER — Ambulatory Visit: Payer: Medicare PPO | Attending: Physician Assistant | Admitting: Rheumatology

## 2021-12-05 ENCOUNTER — Encounter: Payer: Self-pay | Admitting: Rheumatology

## 2021-12-05 ENCOUNTER — Ambulatory Visit: Payer: Medicare PPO | Admitting: Internal Medicine

## 2021-12-05 VITALS — BP 115/73 | HR 108 | Resp 17 | Ht 60.0 in | Wt 116.0 lb

## 2021-12-05 DIAGNOSIS — Z8639 Personal history of other endocrine, nutritional and metabolic disease: Secondary | ICD-10-CM

## 2021-12-05 DIAGNOSIS — M65332 Trigger finger, left middle finger: Secondary | ICD-10-CM

## 2021-12-05 DIAGNOSIS — M5030 Other cervical disc degeneration, unspecified cervical region: Secondary | ICD-10-CM | POA: Diagnosis not present

## 2021-12-05 DIAGNOSIS — C3491 Malignant neoplasm of unspecified part of right bronchus or lung: Secondary | ICD-10-CM

## 2021-12-05 DIAGNOSIS — M0579 Rheumatoid arthritis with rheumatoid factor of multiple sites without organ or systems involvement: Secondary | ICD-10-CM | POA: Diagnosis not present

## 2021-12-05 DIAGNOSIS — Z79899 Other long term (current) drug therapy: Secondary | ICD-10-CM | POA: Diagnosis not present

## 2021-12-05 DIAGNOSIS — M65331 Trigger finger, right middle finger: Secondary | ICD-10-CM

## 2021-12-05 DIAGNOSIS — M7021 Olecranon bursitis, right elbow: Secondary | ICD-10-CM

## 2021-12-05 MED ORDER — HYDROXYCHLOROQUINE SULFATE 200 MG PO TABS: 200.0000 mg | ORAL_TABLET | ORAL | 0 refills | Status: DC

## 2021-12-05 NOTE — Patient Instructions (Signed)
Standing Labs We placed an order today for your standing lab work.   Please have your standing labs drawn in October and every 3 months  If possible, please have your labs drawn 2 weeks prior to your appointment so that the provider can discuss your results at your appointment.  Please note that you may see your imaging and lab results in [facility] before we have reviewed them. We may be awaiting multiple results to interpret others before contacting you. Please allow our office up to 72 hours to thoroughly review all of the results before contacting the office for clarification of your results.  We have open lab daily: Monday through Thursday from 1:30-4:30 PM and Friday from 1:30-4:00 PM at the office of Dr. Bo Merino, [facility].   Please be advised, all patients with office appointments requiring lab work will take precedent over walk-in lab work.  If possible, please come for your lab work on Monday and Friday afternoons, as you may experience shorter wait times. The office is located at [address] [place], [place], [place] [address] No appointment is necessary.   Labs are drawn by [facility]. Please bring your co-pay at the time of your lab draw.  You may receive a bill from [facility] for your lab work.  Please note if you are on Hydroxychloroquine and and an order has been placed for a Hydroxychloroquine level, you will need to have it drawn 4 hours or more after your last dose.  If you wish to have your labs drawn at another location, please call the office 24 hours in advance to send orders.  If you have any questions regarding directions or hours of operation,  please call [phone].   As a reminder, please drink plenty of water prior to coming for your lab work. Thanks!   Vaccines You are taking a medication(s) that can suppress your immune system.  The following immunizations are recommended: Flu annually Covid-19  Td/Tdap (tetanus, diphtheria,  pertussis) every 10 years Pneumonia (Prevnar 15 then Pneumovax 23 at least 1 year apart.  Alternatively, can take Prevnar 20 without needing additional dose) Shingrix: 2 doses from 4 weeks to 6 months apart  Please check with your PCP to make sure you are up to date.

## 2021-12-06 ENCOUNTER — Other Ambulatory Visit: Payer: Self-pay

## 2021-12-10 ENCOUNTER — Ambulatory Visit (INDEPENDENT_AMBULATORY_CARE_PROVIDER_SITE_OTHER): Payer: Medicare PPO | Admitting: Family Medicine

## 2021-12-10 ENCOUNTER — Encounter: Payer: Self-pay | Admitting: Family Medicine

## 2021-12-10 VITALS — BP 118/74 | HR 103 | Temp 97.4°F | Wt 115.2 lb

## 2021-12-10 DIAGNOSIS — R42 Dizziness and giddiness: Secondary | ICD-10-CM | POA: Diagnosis not present

## 2021-12-10 MED ORDER — AMOXICILLIN-POT CLAVULANATE 875-125 MG PO TABS: 1.0000 | ORAL_TABLET | Freq: Two times a day (BID) | ORAL | 0 refills | Status: AC

## 2021-12-10 MED ORDER — PREDNISONE 20 MG PO TABS: 20.0000 mg | ORAL_TABLET | Freq: Every day | ORAL | 0 refills | Status: AC

## 2021-12-10 NOTE — Patient Instructions (Signed)
We are prescribing prednisone and Augmentin for Eustachian Tube Dysfunction.  You may stop flonase while on prednisone.  If you have improvement, but incomplete resolution, you may also try OTC antihistamine (e.g. Zyrtec, Claritin, store brand is acceptable), and/or OTC Afrin for additional support. This may need to be used on an intermittent or continuous basis depending on return/severity of symptoms.

## 2021-12-10 NOTE — Progress Notes (Unsigned)
Assessment/Plan:   Problem List Items Addressed This Visit   None      Subjective:  HPI:  Susan Holder is a 66 y.o. female who has Rheumatoid arthritis involving multiple sites with positive rheumatoid factor ([place]); ANA positive; Vitamin D deficiency; High risk medication use; Trigger finger, left ring finger; Trigger finger, right ring finger; DDD (degenerative disc disease), cervical; Smoker; Other fatigue; Left hand pain; Allergic rhinitis; Lung mass; Adenocarcinoma of right lung, stage 4 ([place]); Encounter for antineoplastic chemotherapy; Goals of care, counseling/discussion; Primary malignant neoplasm of bronchus of right lower lobe ([place]); Encounter for antineoplastic immunotherapy; Acid reflux; Hoarseness; 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[place]); Malignant neoplasm metastatic to brain [facility]); met lung ca; Rheumatoid arthritis ([place]); Aortic atherosclerosis ([place]); Coronary artery calcification seen on CT scan; Pericardial effusion; S/P pericardial window creation; Malnutrition of moderate degree; Pleural effusion; Anemia; S/P Left sided pigtail catheter placement; Dizziness; and Unsteady gait when walking on their problem list..   She  has a past medical history of Acid reflux (09/21/2018), Adenocarcinoma of right lung, stage 4 (HCC) (11/17/2017), Allergic rhinitis (08/22/2013), ANA positive (05/29/2016), Aortic atherosclerosis ([place]) (10/23/2020), Brain metastases (03/29/2019), COPD (chronic obstructive pulmonary disease) ([place]), Coronary artery calcification seen on CT scan (10/23/2020), DDD (degenerative disc disease), cervical (05/29/2016), Dyspnea, Encounter for antineoplastic chemotherapy (11/17/2017), Encounter for antineoplastic immunotherapy (02/01/2018), Goals of care, counseling/discussion (11/17/2017), High risk medication use (05/29/2016), Hoarseness (03/16/2018), Left hand pain (03/06/2017), Lung mass, Malnutrition of moderate degree (02/09/2021), met lung ca (dx'd 09/2017),  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[place]) (11/04/2018), Other fatigue (05/29/2016), Pericardial effusion (10/23/2020), Primary malignant neoplasm of bronchus of right lower lobe ([place]) (12/21/2017), Rheumatoid arthritis ([place]), Rheumatoid arthritis involving multiple sites with positive rheumatoid factor ([place]) (05/29/2016), S/P pericardial window creation (02/07/2021), Smoker (05/29/2016), Trigger finger, left ring finger (05/29/2016), Trigger finger, right ring finger (05/29/2016), and Vitamin D deficiency (05/29/2016)..   She presents with chief complaint of Follow-up (2 week follow up. Patient still feels unbalance. The ENT we referred her to doesn't have availability until October. ) .   Past Surgical History:  Procedure Laterality Date   BRONCHIAL NEEDLE ASPIRATION BIOPSY  11/09/2017   Procedure: BRONCHIAL NEEDLE ASPIRATION BIOPSIES;  Surgeon: Marshell Garfinkel, MD;  [facility];  Service: Cardiopulmonary;;   CHEST TUBE INSERTION Left 05/13/2021   Procedure: INSERTION PLEURAL DRAINAGE CATHETER;  Surgeon: Lajuana Matte, MD;  Location: [place];  Service: Thoracic;  Laterality: Left;   ENDOBRONCHIAL ULTRASOUND Bilateral 11/09/2017   Procedure: ENDOBRONCHIAL ULTRASOUND;  Surgeon: Marshell Garfinkel, MD;  [facility];  Service: Cardiopulmonary;  Laterality: Bilateral;   NASAL SINUS SURGERY     NASAL SINUS SURGERY     THORACENTESIS Right 02/07/2021   Procedure: THORACENTESIS;  Surgeon: Lajuana Matte, MD;  Location: [place];  Service: Thoracic;  Laterality: Right;   TUBAL LIGATION     VIDEO BRONCHOSCOPY  11/09/2017   Procedure: VIDEO BRONCHOSCOPY;  Surgeon: Marshell Garfinkel, MD;  [facility];  Service: Cardiopulmonary;;   XI ROBOTIC ASSISTED PERICARDIAL WINDOW Left 02/07/2021   Procedure: XI ROBOTIC ASSISTED THORACOSCOPY PERICARDIAL WINDOW;  Surgeon: Lajuana Matte, MD;  Location: [place];  Service: Thoracic;  Laterality: Left;    Outpatient Medications  Prior to Visit  Medication Sig Dispense Refill   acetaminophen (TYLENOL) 325 MG tablet Take 2 tablets (650 mg total) by mouth every 6 (six) hours. (Patient taking differently: Take 650 mg by mouth every 6 (six) hours as needed for moderate pain.)     fluticasone (  FLONASE) 50 MCG/ACT nasal spray Place 2 sprays into both nostrils daily. 16 g 6   folic acid (FOLVITE) 1 MG tablet Take 1 mg by mouth daily.     hydroxychloroquine (PLAQUENIL) 200 MG tablet Take 1 tablet (200 mg total) by mouth See admin instructions. Take 1 tablet by mouth twice daily Monday through Friday only 120 tablet 0   omeprazole (PRILOSEC) 20 MG capsule Take 20 mg by mouth daily.     prochlorperazine (COMPAZINE) 10 MG tablet TAKE 1 TABLET(10 MG) BY MOUTH EVERY 6 HOURS AS NEEDED FOR NAUSEA OR VOMITING (Patient taking differently: Take 10 mg by mouth every 8 (eight) hours as needed for vomiting or nausea.) 30 tablet 2   vitamin C (ASCORBIC ACID) 500 MG tablet Take 500 mg by mouth daily.     VITAMIN D PO Take 1 tablet by mouth daily.     UNABLE TO FIND Med Name  Amoclav  25 mg   twice a day for infection (ulcer) in mouth (Patient not taking: Reported on 12/10/2021)     No facility-administered medications prior to visit.    Family History  Problem Relation Age of Onset   Stroke Mother    Alzheimer's disease Mother    Heart disease Mother    Emphysema Father    Hypertension Brother    Heart attack Maternal Aunt    Heart failure Maternal Grandmother    Hypertension Paternal Grandmother     Social History   Socioeconomic History   Marital status: Divorced    Spouse name: Not on file   Number of children: 1   Years of education: Not on file   Highest education level: Not on file  Occupational History   Not on file  Tobacco Use   Smoking status: Former    Packs/day: 0.50    Years: 45.00    Total pack years: 22.50    Types: Cigarettes    Quit date: 09/29/2017    Years since quitting: 4.2   Smokeless tobacco: Never   Vaping Use   Vaping Use: Never used  Substance and Sexual Activity   Alcohol use: Not Currently   Drug use: No   Sexual activity: Not Currently  Other Topics Concern   Not on file  Social History Narrative   Not on file   Social Determinants of Health   Financial Resource Strain: Low Risk  (12/03/2021)   Overall Financial Resource Strain (CARDIA)    Difficulty of Paying Living Expenses: Not hard at all  Food Insecurity: No Food Insecurity (11/06/2020)   Hunger Vital Sign    Worried About Running Out of Food in the Last Year: Never true    [place] in the Last Year: Never true  Transportation Needs: No Transportation Needs (12/03/2021)   PRAPARE - Hydrologist (Medical): No    Lack of Transportation (Non-Medical): No  Physical Activity: Inactive (12/03/2021)   Exercise Vital Sign    Days of Exercise per Week: 0 days    Minutes of Exercise per Session: 0 min  Stress: No Stress Concern Present (12/03/2021)   [place]    Feeling of Stress : Not at all  Social Connections: Unknown (12/03/2021)   Social Connection and Isolation Panel [NHANES]    Frequency of Communication with Friends and Family: More than three times a week    Frequency of Social Gatherings with Friends and Family: Never  Attends Religious Services: More than 4 times per year    Active Member of Clubs or Organizations: Yes    Attends Archivist Meetings: More than 4 times per year    Marital Status: Not on file  Intimate Partner Violence: Not At Risk (12/03/2021)   Humiliation, Afraid, Rape, and Kick questionnaire    Fear of Current or Ex-Partner: No    Emotionally Abused: No    Physically Abused: No    Sexually Abused: No                                                                                                 Objective:  Physical Exam: BP 118/74 (BP Location: Left Arm, Patient Position:  Sitting, Cuff Size: Large)   Pulse (!) 103   Temp (!) 97.4 F (36.3 C) (Temporal)   Wt 115 lb 3.2 oz (52.3 kg)   LMP 04/28/2000   SpO2 (!) 83% Comment: Patient on O2 tank 2 liter of o2  BMI 22.50 kg/m    ***General: No acute distress. Awake and conversant.  Eyes: Normal conjunctiva, anicteric. Round symmetric pupils.  ENT: Hearing grossly intact. No nasal discharge.  Neck: Neck is supple. No masses or thyromegaly.  Respiratory: Respirations are non-labored. No auditory wheezing.  Skin: Warm. No rashes or ulcers.  Psych: Alert and oriented. Cooperative, Appropriate mood and affect, Normal judgment.  CV: No cyanosis or JVD MSK: Normal ambulation. No clubbing  Neuro: Sensation and CN II-XII grossly normal.        Alesia Banda, MD, MS

## 2021-12-12 NOTE — Assessment & Plan Note (Addendum)
Although improved, given incomplete resolution, will retrial steroids and add Augmentin Patient may also use OTC Flonase, Afrin

## 2021-12-17 NOTE — Progress Notes (Signed)
Subjective:   Susan Holder is a 66 y.o. female who presents for an Initial Medicare Annual Wellness Visit.  I connected with  Susan Holder on       12-03-2021 by a  telephone enabled telemedicine application and verified that I am speaking with the correct person using two identifiers.   I discussed the limitations of evaluation and management by telemedicine. The patient expressed understanding and agreed to proceed.  Patient location: home  Provider location: Tele-health-home    Review of Systems     Cardiac Risk Factors include: advanced age (>20mn, >>51women);hypertension;sedentary lifestyle;family history of premature cardiovascular disease     Objective:    Today's Vitals   There is no height or weight on file to calculate BMI.     12/03/2021   12:49 PM 11/12/2021    8:22 AM 10/30/2021    1:15 PM 09/04/2021   11:23 AM 08/30/2021    2:27 PM 08/14/2021    3:15 PM 07/24/2021   11:26 AM  Advanced Directives  Does Patient Have a Medical Advance Directive? No Yes No;_0   Type of AAcademic librarianLiving will;Healthcare Power of Attorney  Living will;Healthcare Power of Attorney Living will;Healthcare Power of [place]   Does patient want to make changes to medical advance directive?   Yes (ED - Information included in AVS)    No - Patient declined  Copy of [place] Chart? No - copy requested No - copy requested       Would patient like information on creating a medical advance directive? No - Patient declined No - Patient declined    No - Patient declined     Current Medications (verified) Outpatient Encounter Medications as of 12/03/2021  Medication Sig   acetaminophen (TYLENOL) 325 MG tablet Take 2 tablets (650 mg total) by mouth every 6 (six) hours. (Patient taking differently: Take 650 mg by mouth every 6 (six) hours as needed for moderate pain.)   fluticasone  (FLONASE) 50 MCG/ACT nasal spray Place 2 sprays into both nostrils daily.   folic acid (FOLVITE) 1 MG tablet Take 1 mg by mouth daily.   omeprazole (PRILOSEC) 20 MG capsule Take 20 mg by mouth daily.   prochlorperazine (COMPAZINE) 10 MG tablet TAKE 1 TABLET(10 MG) BY MOUTH EVERY 6 HOURS AS NEEDED FOR NAUSEA OR VOMITING (Patient taking differently: Take 10 mg by mouth every 8 (eight) hours as needed for vomiting or nausea.)   UNABLE TO FIND Med Name  Amoclav  25 mg   twice a day for infection (ulcer) in mouth (Patient not taking: Reported on 12/10/2021)   vitamin C (ASCORBIC ACID) 500 MG tablet Take 500 mg by mouth daily.   VITAMIN D PO Take 1 tablet by mouth daily.   [DISCONTINUED] hydroxychloroquine (PLAQUENIL) 200 MG tablet TAKE 1 TABLET BY MOUTH TWICE DAILY MONDAY THROUGH FRIDAY ONLY. (Patient taking differently: Take 200 mg by mouth See admin instructions. Take 1 tablet by mouth twice daily Monday through Friday only)   No facility-administered encounter medications on file as of 12/03/2021.    Allergies (verified) Patient has no known allergies.   History: Past Medical History:  Diagnosis Date   Acid reflux 09/21/2018   Adenocarcinoma of right lung, stage 4 ([place] 11/17/2017   Allergic rhinitis 08/22/2013   ANA positive 05/29/2016   Aortic atherosclerosis ([place] 10/23/2020   Brain metastases 03/29/2019   COPD (chronic  obstructive pulmonary disease) (HCC)    Coronary artery calcification seen on CT scan 10/23/2020   DDD (degenerative disc disease), cervical 05/29/2016   Dyspnea    Encounter for antineoplastic chemotherapy 11/17/2017   Encounter for antineoplastic immunotherapy 02/01/2018   Goals of care, counseling/discussion 11/17/2017   High risk medication use 05/29/2016   04/29/2016: ==> plq 200 am & 100 qhs(adeq response).   Hoarseness 03/16/2018   Left hand pain 03/06/2017   Lung mass    Malnutrition of moderate degree 02/09/2021   met lung ca dx'd 09/2017   neck LN and  brain 2020   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[place]) 11/04/2018   Other fatigue 05/29/2016   Pericardial effusion 10/23/2020   Primary malignant neoplasm of bronchus of right lower lobe ([place]) 12/21/2017   Rheumatoid arthritis (HCC)    Rheumatoid arthritis involving multiple sites with positive rheumatoid factor ([place]) 05/29/2016   +RF +ANA +CCP    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   Smoker 05/29/2016   Trigger finger, left ring finger 05/29/2016   Trigger finger, right ring finger 05/29/2016   Vitamin D deficiency 05/29/2016   Past Surgical History:  Procedure Laterality Date   BRONCHIAL NEEDLE ASPIRATION BIOPSY  11/09/2017   Procedure: BRONCHIAL NEEDLE ASPIRATION BIOPSIES;  Surgeon: Marshell Garfinkel, MD;  [facility];  Service: Cardiopulmonary;;   CHEST TUBE INSERTION Left 05/13/2021   Procedure: INSERTION PLEURAL DRAINAGE CATHETER;  Surgeon: Lajuana Matte, MD;  Location: [place];  Service: Thoracic;  Laterality: Left;   ENDOBRONCHIAL ULTRASOUND Bilateral 11/09/2017   Procedure: ENDOBRONCHIAL ULTRASOUND;  Surgeon: Marshell Garfinkel, MD;  [facility];  Service: Cardiopulmonary;  Laterality: Bilateral;   NASAL SINUS SURGERY     NASAL SINUS SURGERY     THORACENTESIS Right 02/07/2021   Procedure: THORACENTESIS;  Surgeon: Lajuana Matte, MD;  Location: [place];  Service: Thoracic;  Laterality: Right;   TUBAL LIGATION     VIDEO BRONCHOSCOPY  11/09/2017   Procedure: VIDEO BRONCHOSCOPY;  Surgeon: Marshell Garfinkel, MD;  [facility];  Service: Cardiopulmonary;;   XI ROBOTIC ASSISTED PERICARDIAL WINDOW Left 02/07/2021   Procedure: XI ROBOTIC ASSISTED THORACOSCOPY PERICARDIAL WINDOW;  Surgeon: Lajuana Matte, MD;  Location: [place];  Service: Thoracic;  Laterality: Left;   Family History  Problem Relation Age of Onset   Stroke Mother    Alzheimer's disease Mother    Heart disease Mother    Emphysema Father    Hypertension Brother     Heart attack Maternal Aunt    Heart failure Maternal Grandmother    Hypertension Paternal Grandmother    Social History   Socioeconomic History   Marital status: Divorced    Spouse name: Not on file   Number of children: 1   Years of education: Not on file   Highest education level: Not on file  Occupational History   Not on file  Tobacco Use   Smoking status: Former    Packs/day: 0.50    Years: 45.00    Total pack years: 22.50    Types: Cigarettes    Quit date: 09/29/2017    Years since quitting: 4.2   Smokeless tobacco: Never  Vaping Use   Vaping Use: Never used  Substance and Sexual Activity   Alcohol use: Not Currently   Drug use: No   Sexual activity: Not Currently  Other Topics Concern   Not on file  Social History Narrative   Not on file   Social Determinants of Health  Financial Resource Strain: Low Risk  (12/03/2021)   Overall Financial Resource Strain (CARDIA)    Difficulty of Paying Living Expenses: Not hard at all  Food Insecurity: No Food Insecurity (11/06/2020)   Hunger Vital Sign    Worried About Running Out of Food in the Last Year: Never true    Ran Out of Food in the Last Year: Never true  Transportation Needs: No Transportation Needs (12/03/2021)   PRAPARE - Hydrologist (Medical): No    Lack of Transportation (Non-Medical): No  Physical Activity: Inactive (12/03/2021)   Exercise Vital Sign    Days of Exercise per Week: 0 days    Minutes of Exercise per Session: 0 min  Stress: No Stress Concern Present (12/03/2021)   [place]    Feeling of Stress : Not at all  Social Connections: Unknown (12/03/2021)   Social Connection and Isolation Panel [NHANES]    Frequency of Communication with Friends and Family: More than three times a week    Frequency of Social Gatherings with Friends and Family: Never    Attends Religious Services: More than 4 times per year     Active Member of [employer] or Organizations: Yes    Attends Music therapist: More than 4 times per year    Marital Status: Not on file    Tobacco Counseling Counseling given: Not Answered   Clinical Intake:  Pre-visit preparation completed: Yes  Pain : No/denies pain     Nutritional Risks: None Diabetes: No  How often do you need to have someone help you when you read instructions, pamphlets, or other written materials from your doctor or pharmacy?: 1 - Never  Diabetic?  no  Interpreter Needed?: No  Information entered by :: Leroy Kennedy LPN   Activities of Daily Living    12/03/2021    1:06 PM 05/13/2021    3:00 PM  In your present state of health, do you have any difficulty performing the following activities:  Hearing? 0 0  Vision? 0 0  Difficulty concentrating or making decisions? 0 0  Walking or climbing stairs? 1 1  Comment balance off referral to ENT placed   Dressing or bathing? 0 0  Doing errands, shopping? 0 1  Preparing Food and eating ? N   Using the Toilet? N   In the past six months, have you accidently leaked urine? N   Do you have problems with loss of bowel control? N   Managing your Medications? N   Managing your Finances? N   Housekeeping or managing your Housekeeping? N     Patient Care Team: Bonnita Hollow, MD as PCP - General (Family Medicine)  Indicate any recent Medical Services you may have received from other than [facility] providers in the past year (date may be approximate).     Assessment:   This is a routine wellness examination for Noe.  Hearing/Vision screen Hearing Screening - Comments:: No trouble hearing Vision Screening - Comments:: Up to date  [facility]  Dietary issues and exercise activities discussed: Current Exercise Habits: The patient does not participate in regular exercise at present   Goals Addressed             This Visit's Progress    Patient Stated       No goals       Depression  Screen    12/03/2021    1:04 PM 11/19/2021  1:12 PM 05/11/2020   10:05 AM  PHQ 2/9 Scores  PHQ - 2 Score 0 0 0    Fall Risk    12/03/2021   12:49 PM  [place] in the past year? 1  Number falls in past yr: 0  Injury with Fall? 0  Follow up Falls evaluation completed;Education provided;Falls prevention discussed    FALL RISK PREVENTION PERTAINING TO THE HOME:  Any stairs in or around the home? No  If so, are there any without handrails? No  Home free of loose throw rugs in walkways, pet beds, electrical cords, etc? Yes  Adequate lighting in your home to reduce risk of falls? Yes   ASSISTIVE DEVICES UTILIZED TO PREVENT FALLS:  Life alert? No  Use of a cane, walker or w/c? No  Grab bars in the bathroom? No  Shower chair or bench in shower? No  Elevated toilet seat or a handicapped toilet? No   TIMED UP AND GO:  Was the test performed? No .    Cognitive Function:        12/03/2021   12:51 PM  6CIT Screen  What Year? 0 points  What month? 0 points  What time? 0 points  Count back from 20 0 points  Months in reverse 0 points  Repeat phrase 0 points  Total Score 0 points    Immunizations Immunization History  Administered Date(s) Administered   Influenza, High Dose Seasonal PF 01/22/2019   Influenza,inj,Quad PF,6+ Mos 03/08/2018   Influenza-Unspecified 02/08/2020   PFIZER(Purple Top)SARS-COV-2 Vaccination 06/02/2019, 06/23/2019, 12/24/2019    TDAP status: Due, Education has been provided regarding the importance of this vaccine. Advised may receive this vaccine at local pharmacy or Health Dept. Aware to provide a copy of the vaccination record if obtained from local pharmacy or Health Dept. Verbalized acceptance and understanding.  Flu Vaccine status: Due, Education has been provided regarding the importance of this vaccine. Advised may receive this vaccine at local pharmacy or Health Dept. Aware to provide a copy of the vaccination record if obtained  from local pharmacy or Health Dept. Verbalized acceptance and understanding.  Pneumococcal vaccine status: Due, Education has been provided regarding the importance of this vaccine. Advised may receive this vaccine at local pharmacy or Health Dept. Aware to provide a copy of the vaccination record if obtained from local pharmacy or Health Dept. Verbalized acceptance and understanding.  Covid-19 vaccine status: Information provided on how to obtain vaccines.   Qualifies for Shingles Vaccine? Yes   Zostavax completed No   Shingrix Completed?: No.    Education has been provided regarding the importance of this vaccine. Patient has been advised to call insurance company to determine out of pocket expense if they have not yet received this vaccine. Advised may also receive vaccine at local pharmacy or Health Dept. Verbalized acceptance and understanding.  Screening Tests Health Maintenance  Topic Date Due   Hepatitis C Screening  Never done   INFLUENZA VACCINE  11/26/2021   COVID-19 Vaccine (4 - Pfizer risk series) 12/19/2021 (Originally 02/18/2020)   Zoster Vaccines- Shingrix (1 of 2) 02/19/2022 (Originally [birth year]28/1972)   Pneumonia Vaccine 64+ Years old (1 - PCV) 11/20/2022 (Originally 09/29/1961)   MAMMOGRAM  11/20/2022 (Originally 09/29/2003)   DEXA SCAN  11/20/2022 (Originally 09/29/2018)   COLONOSCOPY (Pts 45-62yr Insurance coverage will need to be confirmed)  11/20/2022 (Originally 09/29/1998)   TETANUS/TDAP  11/20/2022 (Originally 09/28/1972)   [place]PV [place]  Maintenance  Health Maintenance Due  Topic Date Due   Hepatitis C Screening  Never done   INFLUENZA VACCINE  11/26/2021    Colonoscopy  no records available Patient states years.  Education provided  Mammogram  patient declined  Bone Density  patient declined  Lung Cancer Screening: (Low Dose CT Chest recommended if Age 28-80 years, 30 pack-year currently smoking OR have quit w/in 15years.) does not qualify.    Lung Cancer Screening Referral:   Additional Screening:  Hepatitis C Screening: does qualify  Vision Screening: Recommended annual ophthalmology exams for early detection of glaucoma and other disorders of the eye. Is the patient up to date with their annual eye exam?  Yes  Who is the provider or what is the name of the office in which the patient attends annual eye exams? [facility] If pt is not established with a provider, would they like to be referred to a provider to establish care? No .   Dental Screening: Recommended annual dental exams for proper oral hygiene  Community Resource Referral / Chronic Care Management: CRR required this visit?  No   CCM required this visit?  No      Plan:     I have personally reviewed and noted the following in the patient's chart:   Medical and social history Use of alcohol, tobacco or illicit drugs  Current medications and supplements including opioid prescriptions. Patient is not currently taking opioid prescriptions. Functional ability and status Nutritional status Physical activity Advanced directives List of other physicians Hospitalizations, surgeries, and ER visits in previous 12 months Vitals Screenings to include cognitive, depression, and falls Referrals and appointments  In addition, I have reviewed and discussed with patient certain preventive protocols, quality metrics, and best practice recommendations. A written personalized care plan for preventive services as well as general preventive health recommendations were provided to patient.     Leroy Kennedy, LPN   [id number]   Nurse Notes:

## 2021-12-19 ENCOUNTER — Encounter: Payer: Self-pay | Admitting: Internal Medicine

## 2021-12-26 ENCOUNTER — Other Ambulatory Visit: Payer: Self-pay | Admitting: Radiation Therapy

## 2021-12-26 DIAGNOSIS — C7931 Secondary malignant neoplasm of brain: Secondary | ICD-10-CM

## 2022-01-06 ENCOUNTER — Other Ambulatory Visit: Payer: Self-pay | Admitting: Medical Oncology

## 2022-01-06 ENCOUNTER — Other Ambulatory Visit: Payer: Self-pay | Admitting: Internal Medicine

## 2022-01-06 DIAGNOSIS — C3491 Malignant neoplasm of unspecified part of right bronchus or lung: Secondary | ICD-10-CM

## 2022-01-06 MED ORDER — FOLIC ACID 1 MG PO TABS: 1.0000 mg | ORAL_TABLET | Freq: Every day | ORAL | 0 refills | Status: DC

## 2022-01-06 NOTE — Telephone Encounter (Signed)
Requested to refill folic acid. Done.

## 2022-01-27 DIAGNOSIS — H6123 Impacted cerumen, bilateral: Secondary | ICD-10-CM | POA: Diagnosis not present

## 2022-01-27 DIAGNOSIS — R2689 Other abnormalities of gait and mobility: Secondary | ICD-10-CM | POA: Diagnosis not present

## 2022-02-06 ENCOUNTER — Other Ambulatory Visit: Payer: Self-pay | Admitting: Rheumatology

## 2022-02-06 DIAGNOSIS — Z79899 Other long term (current) drug therapy: Secondary | ICD-10-CM

## 2022-02-06 DIAGNOSIS — M0579 Rheumatoid arthritis with rheumatoid factor of multiple sites without organ or systems involvement: Secondary | ICD-10-CM

## 2022-02-06 NOTE — Telephone Encounter (Signed)
Next Visit: 05/07/2022  Last Visit: 12/05/2021  Labs: 11/11/2021 CO2 35, Creat. 1.19, GFR 50, Calcium 11.2, AST 14, Anion Gap 3 ,RBC 3.35, Hgb 10.0, Hct 32.1  Eye exam: 03/11/2021 WNL    Current Dose per office note 12/05/2021:  plaquenil 200 mg 1 tablet by mouth twice daily Monday through Friday only  NO:[id number] arthritis involving multiple sites with positive rheumatoid factor   Last Fill: 12/05/2021  Okay to refill Plaquenil?

## 2022-02-12 ENCOUNTER — Ambulatory Visit (HOSPITAL_COMMUNITY)
Admission: RE | Admit: 2022-02-12 | Discharge: 2022-02-12 | Disposition: A | Discharge status: Home / Self Care | Payer: Medicare PPO | Source: Ambulatory Visit | Attending: Physician Assistant | Admitting: Physician Assistant

## 2022-02-12 ENCOUNTER — Inpatient Hospital Stay: Payer: Medicare PPO | Attending: Physician Assistant

## 2022-02-12 DIAGNOSIS — C3491 Malignant neoplasm of unspecified part of right bronchus or lung: Secondary | ICD-10-CM

## 2022-02-12 DIAGNOSIS — J910 Malignant pleural effusion: Secondary | ICD-10-CM | POA: Diagnosis not present

## 2022-02-12 DIAGNOSIS — C3431 Malignant neoplasm of lower lobe, right bronchus or lung: Secondary | ICD-10-CM | POA: Diagnosis not present

## 2022-02-12 DIAGNOSIS — J479 Bronchiectasis, uncomplicated: Secondary | ICD-10-CM | POA: Diagnosis not present

## 2022-02-12 DIAGNOSIS — K5730 Diverticulosis of large intestine without perforation or abscess without bleeding: Secondary | ICD-10-CM | POA: Diagnosis not present

## 2022-02-12 DIAGNOSIS — Z9221 Personal history of antineoplastic chemotherapy: Secondary | ICD-10-CM | POA: Diagnosis not present

## 2022-02-12 DIAGNOSIS — C3490 Malignant neoplasm of unspecified part of unspecified bronchus or lung: Secondary | ICD-10-CM | POA: Insufficient documentation

## 2022-02-12 DIAGNOSIS — Z9981 Dependence on supplemental oxygen: Secondary | ICD-10-CM | POA: Diagnosis not present

## 2022-02-12 DIAGNOSIS — C771 Secondary and unspecified malignant neoplasm of intrathoracic lymph nodes: Secondary | ICD-10-CM | POA: Diagnosis not present

## 2022-02-12 DIAGNOSIS — Z923 Personal history of irradiation: Secondary | ICD-10-CM | POA: Diagnosis not present

## 2022-02-12 DIAGNOSIS — C7931 Secondary malignant neoplasm of brain: Secondary | ICD-10-CM | POA: Insufficient documentation

## 2022-02-12 DIAGNOSIS — K760 Fatty (change of) liver, not elsewhere classified: Secondary | ICD-10-CM | POA: Diagnosis not present

## 2022-02-12 DIAGNOSIS — J90 Pleural effusion, not elsewhere classified: Secondary | ICD-10-CM | POA: Diagnosis not present

## 2022-02-12 DIAGNOSIS — D649 Anemia, unspecified: Secondary | ICD-10-CM | POA: Diagnosis not present

## 2022-02-12 LAB — CBC WITH DIFFERENTIAL (CANCER CENTER ONLY)
Abs Immature Granulocytes: 0.02 10*3/uL (ref 0.00–0.07)
Basophils Absolute: 0 10*3/uL (ref 0.0–0.1)
Basophils Relative: 1 %
Eosinophils Absolute: 0.1 10*3/uL (ref 0.0–0.5)
Eosinophils Relative: 2 %
HCT: 32.8 % — ABNORMAL LOW (ref 36.0–46.0)
Hemoglobin: 10.3 g/dL — ABNORMAL LOW (ref 12.0–15.0)
Immature Granulocytes: 0 %
Lymphocytes Relative: 17 %
Lymphs Abs: 1.1 10*3/uL (ref 0.7–4.0)
MCH: 30.6 pg (ref 26.0–34.0)
MCHC: 31.4 g/dL (ref 30.0–36.0)
MCV: 97.3 fL (ref 80.0–100.0)
Monocytes Absolute: 0.9 10*3/uL (ref 0.1–1.0)
Monocytes Relative: 13 %
Neutro Abs: 4.5 10*3/uL (ref 1.7–7.7)
Neutrophils Relative %: 67 %
Platelet Count: 228 10*3/uL (ref 150–400)
RBC: 3.37 MIL/uL — ABNORMAL LOW (ref 3.87–5.11)
RDW: 13.2 % (ref 11.5–15.5)
WBC Count: 6.7 10*3/uL (ref 4.0–10.5)
nRBC: 0 % (ref 0.0–0.2)

## 2022-02-12 LAB — CMP (CANCER CENTER ONLY)
ALT: 7 U/L (ref 0–44)
AST: 18 U/L (ref 15–41)
Albumin: 3.6 g/dL (ref 3.5–5.0)
Alkaline Phosphatase: 91 U/L (ref 38–126)
Anion gap: 5 (ref 5–15)
BUN: 19 mg/dL (ref 8–23)
CO2: 31 mmol/L (ref 22–32)
Calcium: 10.9 mg/dL — ABNORMAL HIGH (ref 8.9–10.3)
Chloride: 102 mmol/L (ref 98–111)
Creatinine: 1.22 mg/dL — ABNORMAL HIGH (ref 0.44–1.00)
GFR, Estimated: 48 mL/min — ABNORMAL LOW (ref >60)
Glucose, Bld: 82 mg/dL (ref 70–99)
Potassium: 4.6 mmol/L (ref 3.5–5.1)
Sodium: 138 mmol/L (ref 135–145)
Total Bilirubin: 0.2 mg/dL — ABNORMAL LOW (ref 0.3–1.2)
Total Protein: 7.2 g/dL (ref 6.5–8.1)

## 2022-02-12 LAB — TSH: TSH: 2.833 u[IU]/mL (ref 0.350–4.500)

## 2022-02-12 MED ORDER — IOHEXOL 300 MG/ML  SOLN
100.0000 mL | Freq: Once | INTRAMUSCULAR | Status: AC | PRN
  Administered 2022-02-12: 100 mL via INTRAVENOUS

## 2022-02-17 ENCOUNTER — Other Ambulatory Visit: Payer: Self-pay

## 2022-02-17 ENCOUNTER — Inpatient Hospital Stay: Payer: Medicare PPO | Admitting: Internal Medicine

## 2022-02-17 VITALS — BP 145/58 | HR 72 | Temp 98.9°F | Resp 16 | Ht 60.0 in | Wt 125.4 lb

## 2022-02-17 DIAGNOSIS — Z9221 Personal history of antineoplastic chemotherapy: Secondary | ICD-10-CM | POA: Diagnosis not present

## 2022-02-17 DIAGNOSIS — C3491 Malignant neoplasm of unspecified part of right bronchus or lung: Secondary | ICD-10-CM | POA: Diagnosis not present

## 2022-02-17 DIAGNOSIS — C771 Secondary and unspecified malignant neoplasm of intrathoracic lymph nodes: Secondary | ICD-10-CM | POA: Diagnosis not present

## 2022-02-17 DIAGNOSIS — C7931 Secondary malignant neoplasm of brain: Secondary | ICD-10-CM | POA: Diagnosis not present

## 2022-02-17 DIAGNOSIS — J910 Malignant pleural effusion: Secondary | ICD-10-CM | POA: Diagnosis not present

## 2022-02-17 DIAGNOSIS — D649 Anemia, unspecified: Secondary | ICD-10-CM | POA: Diagnosis not present

## 2022-02-17 DIAGNOSIS — C3431 Malignant neoplasm of lower lobe, right bronchus or lung: Secondary | ICD-10-CM | POA: Diagnosis not present

## 2022-02-17 DIAGNOSIS — Z9981 Dependence on supplemental oxygen: Secondary | ICD-10-CM | POA: Diagnosis not present

## 2022-02-17 DIAGNOSIS — Z923 Personal history of irradiation: Secondary | ICD-10-CM | POA: Diagnosis not present

## 2022-02-17 NOTE — Progress Notes (Signed)
[place] [phone]   [phone]  OFFICE PROGRESS NOTE  [place], MD [place] [address]  DIAGNOSIS: Metastatic non-small cell lung cancer initially diagnosed as stage IIIA (T3, N1/N2, M0) non-small cell lung cancer, adenocarcinoma presented with large right lower lobe lung mass with extension to the right hilum and subcarinal area diagnosed in July 2019.  She has brain metastasis in October 2020.   Biomarker Findings Tumor Mutational Burden - TMB-Intermediate (6 Muts/Mb) Microsatellite status - MS-Stable Genomic Findings For a complete list of the genes assayed, please refer to the Appendix. NRAS Q61R ARAF amplification STK11 G56W KRAS G13D MYCN amplification MCL1 amplification NKX2-1 amplification - equivocal? TP53 G245V 7 Disease relevant genes with no reportable alterations: EGFR, ALK, BRAF, MET, ERBB2, RET, ROS1    PRIOR THERAPY: 1) Course of concurrent chemoradiation with weekly carboplatin for AUC of 2 and paclitaxel 45 mg/M2.  Status post 7 cycles.  Last dose was giving 01/11/2018. 2) Consolidation treatment with immunotherapy with Imfinzi (Durvalumab) 10 mg/KG every 2 weeks.  First dose February 09, 2018.  Status post 19 cycles. 3) status post stereotactic body radiotherapy to the enlarging right supraclavicular lymphadenopathy under the care of Dr. Lisbeth Renshaw. 4) SRS to multiple brain metastasis under the care of Dr. Lisbeth Renshaw. 5) Systemic chemotherapy with carboplatin for AUC of 5, Alimta 500 mg/M2 and Keytruda 200 mg IV every 3 weeks.  First dose 08/16/2019.  Status post 35 cycles.  Starting from cycle #5 the patient is on maintenance treatment with Alimta and Keytruda every 3 weeks. Alimta was reduced to 400 mg/m2 starting from cycle #21.  Starting from cycle #30 her treatment was changed to single agent Keytruda every 3 weeks.  Alimta was discontinued secondary to toxicity.   CURRENT THERAPY:  Observation.   INTERVAL HISTORY: KRYSTOL ROCCO 66 y.o. female returns to the clinic today for follow-up visit.  The patient is feeling fine today with no concerning complaints except for the baseline shortness of breath and she is currently on home oxygen.  She also has mild fatigue.  She denied having any chest pain, cough or hemoptysis.  She has no nausea, vomiting, diarrhea or constipation.  She has no headache or visual changes.  She denied having any recent weight loss or night sweats.  She has been in observation for the last several months.  She is here today for evaluation and repeat CT scan of the chest, abdomen and pelvis for restaging of her disease.   MEDICAL HISTORY: Past Medical History:  Diagnosis Date   Acid reflux 09/21/2018   Adenocarcinoma of right lung, stage 4 ([facility]) 11/17/2017   Allergic rhinitis 08/22/2013   ANA positive 05/29/2016   Aortic atherosclerosis ([place]) 10/23/2020   Brain metastases 03/29/2019   COPD (chronic obstructive pulmonary disease) ([facility])    Coronary artery calcification seen on CT scan 10/23/2020   DDD (degenerative disc disease), cervical 05/29/2016   Dyspnea    Encounter for antineoplastic chemotherapy 11/17/2017   Encounter for antineoplastic immunotherapy 02/01/2018   Goals of care, counseling/discussion 11/17/2017   High risk medication use 05/29/2016   04/29/2016: ==> plq 200 am & 100 qhs(adeq response).   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  MEDICATIONS:  Current Outpatient Medications  Medication Sig Dispense Refill   acetaminophen (TYLENOL) 325 MG tablet Take 2 tablets (650 mg total) by mouth every 6 (six) hours. (Patient taking differently: Take 650 mg by mouth every 6 (six) hours as needed for moderate pain.)     fluticasone (FLONASE) 50 MCG/ACT nasal spray Place 2 sprays into both nostrils daily. 16 g 6   folic acid (FOLVITE) 1 MG tablet Take 1 tablet (1 mg total) by mouth daily. 90 tablet 0   hydroxychloroquine (PLAQUENIL) 200 MG tablet TAKE 1 TABLET BY MOUTH TWICE DAILY MONDAY- FRIDAY ONLY 120 tablet 0   omeprazole (PRILOSEC) 20 MG capsule Take 20 mg by mouth daily.     prochlorperazine (COMPAZINE) 10 MG tablet TAKE 1 TABLET(10 MG) BY MOUTH EVERY 6 HOURS AS NEEDED FOR NAUSEA OR VOMITING (Patient taking differently: Take 10 mg by mouth every 8 (eight) hours as needed for vomiting or nausea.) 30 tablet 2   UNABLE TO FIND Med Name  Amoclav  25 mg   twice a day for infection (ulcer) in mouth (Patient not taking: Reported on 12/10/2021)     vitamin C (ASCORBIC ACID) 500 MG tablet Take 500 mg by mouth daily.     VITAMIN D PO Take 1 tablet by mouth daily.     No current facility-administered medications for this visit.    SURGICAL HISTORY:  Past Surgical History:  Procedure Laterality Date   BRONCHIAL NEEDLE ASPIRATION BIOPSY  11/09/2017   Procedure: BRONCHIAL NEEDLE ASPIRATION BIOPSIES;  Surgeon: Marshell Garfinkel, MD;  Location: [facility];  Service: Cardiopulmonary;;   CHEST TUBE INSERTION Left 05/13/2021   Procedure: INSERTION PLEURAL DRAINAGE CATHETER;  Surgeon: Lajuana Matte, MD;  Location: [facility];  Service: Thoracic;  Laterality: Left;   ENDOBRONCHIAL ULTRASOUND Bilateral 11/09/2017   Procedure: ENDOBRONCHIAL  ULTRASOUND;  Surgeon: Marshell Garfinkel, MD;  Location: [facility];  Service: Cardiopulmonary;  Laterality: Bilateral;   NASAL SINUS SURGERY     NASAL SINUS SURGERY     THORACENTESIS Right 02/07/2021   Procedure: THORACENTESIS;  Surgeon: Lajuana Matte, MD;  Location: [facility];  Service: Thoracic;  Laterality: Right;   TUBAL LIGATION     VIDEO BRONCHOSCOPY  11/09/2017   Procedure: VIDEO BRONCHOSCOPY;  Surgeon: Marshell Garfinkel, MD;  Location: [facility];  Service: Cardiopulmonary;;   XI ROBOTIC ASSISTED PERICARDIAL WINDOW Left 02/07/2021   Procedure: XI ROBOTIC ASSISTED THORACOSCOPY PERICARDIAL WINDOW;  Surgeon: Lajuana Matte, MD;  Location: [facility];  Service: Thoracic;  Laterality: Left;    REVIEW OF SYSTEMS:  Constitutional: positive for fatigue Eyes: negative Ears, nose, mouth, throat, and face: negative Respiratory: positive for dyspnea on exertion Cardiovascular: negative Gastrointestinal: negative Genitourinary:negative Integument/breast: negative Hematologic/lymphatic: negative Musculoskeletal:negative Neurological: negative Behavioral/Psych: negative Endocrine: negative Allergic/Immunologic: negative   PHYSICAL EXAMINATION: General appearance: alert, cooperative, fatigued, and no distress Head: Normocephalic, without obvious abnormality, atraumatic Neck: no adenopathy, no JVD, supple, symmetrical, trachea midline, and thyroid not enlarged, symmetric, no tenderness/mass/nodules Lymph nodes: Cervical, supraclavicular, and  axillary nodes normal. Resp: diminished breath sounds RLL and dullness to percussion RLL Back: symmetric, no curvature. ROM normal. No CVA tenderness. Cardio: regular rate and rhythm, S1, S2 normal, no murmur, click, rub or gallop GI: soft, non-tender; bowel sounds normal; no masses,  no organomegaly Extremities: extremities normal, atraumatic, no cyanosis or edema Neurologic: Alert and oriented X 3, normal strength and tone. Normal symmetric  reflexes. Normal coordination and gait  ECOG PERFORMANCE STATUS: 1 - Symptomatic but completely ambulatory  Blood pressure (!) 145/58, pulse 72, temperature 98.9 F (37.2 C), temperature source Oral, resp. rate 16, height 5' (1.524 m), weight 125 lb 6 oz (56.9 kg), last menstrual period 04/28/2000, SpO2 98 %.  LABORATORY DATA: Lab Results  Component Value Date   WBC 6.7 02/12/2022   HGB 10.3 (L) 02/12/2022   HCT 32.8 (L) 02/12/2022   MCV 97.3 02/12/2022   PLT 228 02/12/2022      Chemistry      Component Value Date/Time   NA 138 02/12/2022 1001   K 4.6 02/12/2022 1001   CL 102 02/12/2022 1001   CO2 31 02/12/2022 1001   BUN 19 02/12/2022 1001   CREATININE 1.22 (H) 02/12/2022 1001   CREATININE 0.80 07/24/2017 1438      Component Value Date/Time   CALCIUM 10.9 (H) 02/12/2022 1001   ALKPHOS 91 02/12/2022 1001   AST 18 02/12/2022 1001   ALT 7 02/12/2022 1001   BILITOT 0.2 (L) 02/12/2022 1001       RADIOGRAPHIC STUDIES: CT Chest W Contrast  Result Date: 02/13/2022 CLINICAL DATA:  66 year old female with history of adenocarcinoma of the lung, status post chemotherapy and radiation therapy. Follow-up study. * Tracking Code: BO * EXAM: CT CHEST, ABDOMEN, AND PELVIS WITH CONTRAST TECHNIQUE: Multidetector CT imaging of the chest, abdomen and pelvis was performed following the standard protocol during bolus administration of intravenous contrast. RADIATION DOSE REDUCTION: This exam was performed according to the departmental dose-optimization program which includes automated exposure control, adjustment of the mA and/or kV according to patient size and/or use of iterative reconstruction technique. CONTRAST:  188m OMNIPAQUE IOHEXOL 300 MG/ML  SOLN COMPARISON:  Multiple priors, most recently CT of the chest, abdomen and pelvis 11/11/2021. FINDINGS: CT CHEST FINDINGS Cardiovascular: Heart size is normal. There is no significant pericardial fluid, thickening or pericardial calcification.  There is aortic atherosclerosis, as well as atherosclerosis of the great vessels of the mediastinum and the coronary arteries, including calcified atherosclerotic plaque in the left main, left anterior descending and right coronary arteries. Mediastinum/Nodes: No pathologically enlarged mediastinal or hilar lymph nodes. Circumferential thickening of the distal esophagus, similar to the prior study. No axillary lymphadenopathy. Lungs/Pleura: Large chronic right pleural effusion with some chronic pleural thickening and enhancement, stable compared to the prior examination. This is associated with extensive passive atelectasis in the right lung. Chronic perihilar mass-like architectural distortion in the right lung involving portions of the right upper and lower lobes, stable compared to the prior study, most compatible with an area of chronic postradiation mass-like fibrosis. Patchy areas of ground-glass attenuation, septal thickening and regional areas of architectural distortion with some scattered areas of mild cylindrical bronchiectasis are also noted in the left mid to lower lung, increased compared to the prior examination. Some of these regions demonstrate areas of subpleural sparing, yielding a phenotype suggestive of cryptogenic organizing pneumonia (COP). No left pleural effusion. Musculoskeletal: Well-defined sclerosis in the anterolateral aspect of the right seventh rib, stable on numerous prior examinations dating back to  09/29/2017, presumably benign. There are no other new aggressive appearing lytic or blastic lesions noted in the visualized portions of the skeleton. CT ABDOMEN PELVIS FINDINGS Hepatobiliary: Diffuse low attenuation throughout the hepatic parenchyma, indicative of a background of hepatic steatosis. No suspicious cystic or solid hepatic lesions. No intra or extrahepatic biliary ductal dilatation. Gallbladder is normal in appearance. Pancreas: No pancreatic mass. No pancreatic ductal  dilatation. No pancreatic or peripancreatic fluid collections or inflammatory changes. Spleen: Unremarkable. Adrenals/Urinary Tract: Mild cortical thinning in the kidneys bilaterally. Bilateral kidneys and adrenal glands are otherwise normal in appearance. No hydroureteronephrosis. Urinary bladder is normal in appearance. Stomach/Bowel: The appearance of the stomach is normal. No pathologic dilatation of small bowel or colon. Multiple colonic diverticula are noted, without surrounding inflammatory changes to suggest an acute diverticulitis at this time. Normal appendix. Vascular/Lymphatic: Atherosclerotic calcifications in the abdominal aorta and pelvic vasculature, without evidence of aneurysm or dissection. No lymphadenopathy noted in the abdomen or pelvis. Reproductive: Uterus and ovaries are unremarkable in appearance. Tubal ligation clips are noted bilaterally. Other: No significant volume of ascites.  No pneumoperitoneum. Musculoskeletal: There are no aggressive appearing lytic or blastic lesions noted in the visualized portions of the skeleton. IMPRESSION: 1. Stable postradiation changes in the right lung with large chronic right pleural effusion, but no definitive findings to suggest locally recurrent disease or definite metastatic disease in the thorax. 2. However, there are evolving changes throughout the left lung which are unusual. Strictly speaking, the possibility of lymphangitic spread of disease in the left lung is not excluded, but not strongly favored. Primary differential considerations are that of drug reaction such as cryptogenic organizing pneumonia (COP) (as can be seen with immune check point inhibitors such as Keytruda) or progressive worsening interstitial lung disease. Further clinical evaluation is recommended. 3. Aortic atherosclerosis, in addition to left main and 2 vessel coronary artery disease. Please note that although the presence of coronary artery calcium documents the presence  of coronary artery disease, the severity of this disease and any potential stenosis cannot be assessed on this non-gated CT examination. Assessment for potential risk factor modification, dietary therapy or pharmacologic therapy may be warranted, if clinically indicated. 4. Hepatic steatosis. 5. Colonic diverticulosis without evidence of acute diverticulitis at this time. These results will be called to the ordering clinician or representative by the Radiologist Assistant, and communication documented in the PACS or [employer]. Electronically Signed   By: Vinnie Langton M.D.   On: 02/13/2022 12:47   CT Abdomen Pelvis W Contrast  Result Date: 02/13/2022 CLINICAL DATA:  66 year old female with history of adenocarcinoma of the lung, status post chemotherapy and radiation therapy. Follow-up study. * Tracking Code: BO * EXAM: CT CHEST, ABDOMEN, AND PELVIS WITH CONTRAST TECHNIQUE: Multidetector CT imaging of the chest, abdomen and pelvis was performed following the standard protocol during bolus administration of intravenous contrast. RADIATION DOSE REDUCTION: This exam was performed according to the departmental dose-optimization program which includes automated exposure control, adjustment of the mA and/or kV according to patient size and/or use of iterative reconstruction technique. CONTRAST:  170m OMNIPAQUE IOHEXOL 300 MG/ML  SOLN COMPARISON:  Multiple priors, most recently CT of the chest, abdomen and pelvis 11/11/2021. FINDINGS: CT CHEST FINDINGS Cardiovascular: Heart size is normal. There is no significant pericardial fluid, thickening or pericardial calcification. There is aortic atherosclerosis, as well as atherosclerosis of the great vessels of the mediastinum and the coronary arteries, including calcified atherosclerotic plaque in the left main, left anterior descending and right  coronary arteries. Mediastinum/Nodes: No pathologically enlarged mediastinal or hilar lymph nodes. Circumferential  thickening of the distal esophagus, similar to the prior study. No axillary lymphadenopathy. Lungs/Pleura: Large chronic right pleural effusion with some chronic pleural thickening and enhancement, stable compared to the prior examination. This is associated with extensive passive atelectasis in the right lung. Chronic perihilar mass-like architectural distortion in the right lung involving portions of the right upper and lower lobes, stable compared to the prior study, most compatible with an area of chronic postradiation mass-like fibrosis. Patchy areas of ground-glass attenuation, septal thickening and regional areas of architectural distortion with some scattered areas of mild cylindrical bronchiectasis are also noted in the left mid to lower lung, increased compared to the prior examination. Some of these regions demonstrate areas of subpleural sparing, yielding a phenotype suggestive of cryptogenic organizing pneumonia (COP). No left pleural effusion. Musculoskeletal: Well-defined sclerosis in the anterolateral aspect of the right seventh rib, stable on numerous prior examinations dating back to 09/29/2017, presumably benign. There are no other new aggressive appearing lytic or blastic lesions noted in the visualized portions of the skeleton. CT ABDOMEN PELVIS FINDINGS Hepatobiliary: Diffuse low attenuation throughout the hepatic parenchyma, indicative of a background of hepatic steatosis. No suspicious cystic or solid hepatic lesions. No intra or extrahepatic biliary ductal dilatation. Gallbladder is normal in appearance. Pancreas: No pancreatic mass. No pancreatic ductal dilatation. No pancreatic or peripancreatic fluid collections or inflammatory changes. Spleen: Unremarkable. Adrenals/Urinary Tract: Mild cortical thinning in the kidneys bilaterally. Bilateral kidneys and adrenal glands are otherwise normal in appearance. No hydroureteronephrosis. Urinary bladder is normal in appearance. Stomach/Bowel: The  appearance of the stomach is normal. No pathologic dilatation of small bowel or colon. Multiple colonic diverticula are noted, without surrounding inflammatory changes to suggest an acute diverticulitis at this time. Normal appendix. Vascular/Lymphatic: Atherosclerotic calcifications in the abdominal aorta and pelvic vasculature, without evidence of aneurysm or dissection. No lymphadenopathy noted in the abdomen or pelvis. Reproductive: Uterus and ovaries are unremarkable in appearance. Tubal ligation clips are noted bilaterally. Other: No significant volume of ascites.  No pneumoperitoneum. Musculoskeletal: There are no aggressive appearing lytic or blastic lesions noted in the visualized portions of the skeleton. IMPRESSION: 1. Stable postradiation changes in the right lung with large chronic right pleural effusion, but no definitive findings to suggest locally recurrent disease or definite metastatic disease in the thorax. 2. However, there are evolving changes throughout the left lung which are unusual. Strictly speaking, the possibility of lymphangitic spread of disease in the left lung is not excluded, but not strongly favored. Primary differential considerations are that of drug reaction such as cryptogenic organizing pneumonia (COP) (as can be seen with immune check point inhibitors such as Keytruda) or progressive worsening interstitial lung disease. Further clinical evaluation is recommended. 3. Aortic atherosclerosis, in addition to left main and 2 vessel coronary artery disease. Please note that although the presence of coronary artery calcium documents the presence of coronary artery disease, the severity of this disease and any potential stenosis cannot be assessed on this non-gated CT examination. Assessment for potential risk factor modification, dietary therapy or pharmacologic therapy may be warranted, if clinically indicated. 4. Hepatic steatosis. 5. Colonic diverticulosis without evidence of  acute diverticulitis at this time. These results will be called to the ordering clinician or representative by the Radiologist Assistant, and communication documented in the PACS or [employer]. Electronically Signed   By: Vinnie Langton M.D.   On: 02/13/2022 12:47    ASSESSMENT AND  PLAN: This is a very pleasant 66 years old African-American female with metastatic non-small cell lung cancer initially diagnosed with a stage IIIA non-small cell lung cancer, adenocarcinoma.  She underwent a course of concurrent chemoradiation with weekly carboplatin and paclitaxel status post 7 cycles with partial response.   The patient tolerated this course of treatment well except for mild odynophagia and dysphagia. She completed on consolidation treatment with immunotherapy with Imfinzi (Durvalumab) status post 18 cycles. She also completed SBRT to the right supraclavicular lymphadenopathy. The patient had evidence for multiple brain metastasis in October 2020 and she underwent SRS treatment to this lesion under the care of Dr. Lisbeth Renshaw. The patient had evidence for disease progression and she started systemic chemotherapy with carboplatin, Alimta and Keytruda status post 35 cycles.  Starting from cycle #5 she is on maintenance treatment with Alimta and Keytruda every 3 weeks.  Starting from cycle #30 the patient is on treatment with single agent Keytruda 200 Mg IV every 3 weeks.  Alimta was discontinued secondary to intolerance. The patient is currently on observation and she is feeling fine with no concerning complaints except for the baseline shortness of breath and she is currently on home oxygen. She had repeat CT scan of the chest, abdomen and pelvis performed recently.  I personally and independently reviewed the scan images and discussed the result with the patient today. Her scan showed no concerning findings for disease progression but there was evolving changes throughout the left lung suspicious for  lymphangitic spread of disease versus inflammatory process from immunotherapy or cryptogenic organizing pneumonia.  She also has persistently moderately large right pleural effusion. I recommended for the patient to see Dr. Valeta Harms for evaluation and consideration of drainage of the right pleural effusion as well as consideration of bronchoscopy to evaluate the opacity in the left lung. I will see the patient back for follow-up visit in around 4 weeks for evaluation and discussion of her treatment options based on the biopsy results by Dr. Valeta Harms. For the anemia she will continue with the oral iron tablets for now. The patient was advised to call immediately if she has any other concerning symptoms in the interval.  The total time spent in the appointment was 35 minutes.  Disclaimer: This note was dictated with voice recognition software. Similar sounding words can inadvertently be transcribed and may not be corrected upon review.

## 2022-02-20 ENCOUNTER — Ambulatory Visit: Payer: Medicare PPO | Admitting: Pulmonary Disease

## 2022-02-20 ENCOUNTER — Encounter: Payer: Self-pay | Admitting: Pulmonary Disease

## 2022-02-20 VITALS — BP 102/60 | HR 108 | Temp 98.3°F | Ht 60.0 in | Wt 125.6 lb

## 2022-02-20 DIAGNOSIS — J90 Pleural effusion, not elsewhere classified: Secondary | ICD-10-CM | POA: Diagnosis not present

## 2022-02-20 DIAGNOSIS — R918 Other nonspecific abnormal finding of lung field: Secondary | ICD-10-CM

## 2022-02-20 DIAGNOSIS — C3491 Malignant neoplasm of unspecified part of right bronchus or lung: Secondary | ICD-10-CM | POA: Insufficient documentation

## 2022-02-20 NOTE — Progress Notes (Signed)
Synopsis: Referred in December 2022 for pleural effusion by Bonnita Hollow, MD  Subjective:   PATIENT ID: Susan Holder GENDER: female DOB: December 05, 1955, MRN: [id number]  Chief Complaint  Patient presents with   Follow-up    Discuss CT scans    This is a 66 year old female, stage IV lung cancer, brain metastasis, pericardial effusion status post pericardial window, history of rheumatoid arthritis RF, ANA and anti-CCP positive.  Patient currently undergoing therapy with Dr. Earlie Server from medical oncology.  Recently saw Dr. Kipp Brood in clinic with bilateral pleural effusion.  Referred today for evaluation of pleural effusion consideration for thoracentesis.  Patient is not on any blood thinners.  She does have some shortness of breath with exertion.  OV 04/12/2021: Here today for repeat office-based thoracentesis.  After last thoracentesis in the office she developed significant amount of pain and drainage.  She recovered from this after resting in the office for a few moments chest x-ray looks fine with reinflation of the lung and no evidence of ex vacuo.  Unfortunately had significant amount of pain after last drainage.  We discussed potentially undergoing a left-sided repeat thoracentesis today.  Unfortunately due to the concern of having the same symptoms she really was unsure about having this done once we set up an had already consented to move forward patient declined having it done.  We discussed the pros and cons of leaving the fluid in the chest.  She ultimately decided on having a Pleurx catheter placed on the right side to see if she could be able to drain this slowly at home without having these significant symptoms.  OV 02/20/2022: Here today for follow-up patient was diagnosed with stage IV lung cancer.  Evidence of brain metastasis.  She was referred back to see me for worsening right-sided pleural effusion.Patient was recently seen by Dr. Earlie Server on 02/17/2022.  Patient is  currently undergoing treatments with immunotherapy durvalumab.  Recommended for her to have a repeat evaluation here in the office for consideration of drainage of the right pleural fluid and consideration for bronchoscopy to the opacities in the left lung.  Dr. Earlie Server is concerned for lymphangitic spread in the left lung.  Recent CT scan of the chest on 02/12/2022 shows postradiation changes in the right lung large chronic right-sided pleural effusion.  Possibility of lymphangitic spread in the left lung is not excluded other concerns for potential cryptogenic organizing pneumonia versus immune checkpoint inhibitor related disease with worsening interstitial findings could explain left lung disease.     Past Medical History:  Diagnosis Date   Acid reflux 09/21/2018   Adenocarcinoma of right lung, stage 4 ([place]) 11/17/2017   Allergic rhinitis 08/22/2013   ANA positive 05/29/2016   Aortic atherosclerosis ([place]) 10/23/2020   Brain metastases 03/29/2019   COPD (chronic obstructive pulmonary disease) ([place])    Coronary artery calcification seen on CT scan 10/23/2020   DDD (degenerative disc disease), cervical 05/29/2016   Dyspnea    Encounter for antineoplastic chemotherapy 11/17/2017   Encounter for antineoplastic immunotherapy 02/01/2018   Goals of care, counseling/discussion 11/17/2017   High risk medication use 05/29/2016   04/29/2016: ==> plq 200 am & 100 qhs(adeq response).   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) 11/04/2018   Other fatigue 05/29/2016   Pericardial effusion  10/23/2020   Primary malignant neoplasm of bronchus of right lower lobe ([place]) 12/21/2017   Rheumatoid arthritis (HCC)    Rheumatoid arthritis involving multiple sites with positive rheumatoid factor ([place]) 05/29/2016   +RF +ANA +CCP    S/P  pericardial window creation 02/07/2021   Smoker 05/29/2016   Trigger finger, left ring finger 05/29/2016   Trigger finger, right ring finger 05/29/2016   Vitamin D deficiency 05/29/2016     Family History  Problem Relation Age of Onset   Stroke Mother    Alzheimer's disease Mother    Heart disease Mother    Emphysema Father    Hypertension Brother    Heart attack Maternal Aunt    Heart failure Maternal Grandmother    Hypertension Paternal Grandmother      Past Surgical History:  Procedure Laterality Date   BRONCHIAL NEEDLE ASPIRATION BIOPSY  11/09/2017   Procedure: BRONCHIAL NEEDLE ASPIRATION BIOPSIES;  Surgeon: Marshell Garfinkel, MD;  Location: [facility];  Service: Cardiopulmonary;;   CHEST TUBE INSERTION Left 05/13/2021   Procedure: INSERTION PLEURAL DRAINAGE CATHETER;  Surgeon: Lajuana Matte, MD;  Location: [facility];  Service: Thoracic;  Laterality: Left;   ENDOBRONCHIAL ULTRASOUND Bilateral 11/09/2017   Procedure: ENDOBRONCHIAL ULTRASOUND;  Surgeon: Marshell Garfinkel, MD;  Location: [facility];  Service: Cardiopulmonary;  Laterality: Bilateral;   NASAL SINUS SURGERY     NASAL SINUS SURGERY     THORACENTESIS Right 02/07/2021   Procedure: THORACENTESIS;  Surgeon: Lajuana Matte, MD;  Location: [facility];  Service: Thoracic;  Laterality: Right;   TUBAL LIGATION     VIDEO BRONCHOSCOPY  11/09/2017   Procedure: VIDEO BRONCHOSCOPY;  Surgeon: Marshell Garfinkel, MD;  Location: [facility];  Service: Cardiopulmonary;;   XI ROBOTIC ASSISTED PERICARDIAL WINDOW Left 02/07/2021   Procedure: XI ROBOTIC ASSISTED THORACOSCOPY PERICARDIAL WINDOW;  Surgeon: Lajuana Matte, MD;  Location: [facility];  Service: Thoracic;  Laterality: Left;    Social History   Socioeconomic History   Marital status: Divorced    Spouse name: Not on file   Number of children: 1   Years of education: Not on file   Highest education level: Not on file  Occupational History   Not on file  Tobacco Use    Smoking status: Former    Packs/day: 0.50    Years: 45.00    Total pack years: 22.50    Types: Cigarettes    Quit date: 09/29/2017    Years since quitting: 4.3   Smokeless tobacco: Never  Vaping Use   Vaping Use: Never used  Substance and Sexual Activity   Alcohol use: Not Currently   Drug use: No   Sexual activity: Not Currently  Other Topics Concern   Not on file  Social History Narrative   Not on file   Social Determinants of Health   Financial Resource Strain: Low Risk  (12/03/2021)   Overall Financial Resource Strain (CARDIA)    Difficulty of Paying Living Expenses: Not hard at all  Food Insecurity: No Food Insecurity (11/06/2020)   Hunger Vital Sign    Worried About Running Out of Food in the Last Year: Never true    Ran Out of Food in the Last Year: Never true  Transportation Needs: No Transportation Needs (12/03/2021)   PRAPARE - Hydrologist (Medical): No    Lack of Transportation (Non-Medical): No  Physical Activity: Inactive (12/03/2021)   Exercise Vital Sign    Days of Exercise per Week: 0 days  Minutes of Exercise per Session: 0 min  Stress: No Stress Concern Present (12/03/2021)   [employer] - Occupational Stress Questionnaire    Feeling of Stress : Not at all  Social Connections: Unknown (12/03/2021)   Social Connection and Isolation Panel [NHANES]    Frequency of Communication with Friends and Family: More than three times a week    Frequency of Social Gatherings with Friends and Family: Never    Attends Religious Services: More than 4 times per year    Active Member of Clubs or Organizations: Yes    Attends Club or Organization Meetings: More than 4 times per year    Marital Status: Not on file  Intimate Partner Violence: Not At Risk (12/03/2021)   Humiliation, Afraid, Rape, and Kick questionnaire    Fear of Current or Ex-Partner: No    Emotionally Abused: No    Physically Abused: No    Sexually Abused:  No     No Known Allergies   Outpatient Medications Prior to Visit  Medication Sig Dispense Refill   acetaminophen (TYLENOL) 325 MG tablet Take 2 tablets (650 mg total) by mouth every 6 (six) hours. (Patient taking differently: Take 650 mg by mouth every 6 (six) hours as needed for moderate pain.)     fluticasone (FLONASE) 50 MCG/ACT nasal spray Place 2 sprays into both nostrils daily. 16 g 6   folic acid (FOLVITE) 1 MG tablet Take 1 tablet (1 mg total) by mouth daily. 90 tablet 0   hydroxychloroquine (PLAQUENIL) 200 MG tablet TAKE 1 TABLET BY MOUTH TWICE DAILY MONDAY- FRIDAY ONLY 120 tablet 0   omeprazole (PRILOSEC) 20 MG capsule Take 20 mg by mouth daily.     prochlorperazine (COMPAZINE) 10 MG tablet TAKE 1 TABLET(10 MG) BY MOUTH EVERY 6 HOURS AS NEEDED FOR NAUSEA OR VOMITING (Patient taking differently: Take 10 mg by mouth every 8 (eight) hours as needed for vomiting or nausea.) 30 tablet 2   UNABLE TO FIND Med Name  Amoclav  25 mg   twice a day for infection (ulcer) in mouth     vitamin C (ASCORBIC ACID) 500 MG tablet Take 500 mg by mouth daily.     VITAMIN D PO Take 1 tablet by mouth daily.     No facility-administered medications prior to visit.    Review of Systems  Constitutional:  Positive for malaise/fatigue. Negative for chills, fever and weight loss.  HENT:  Negative for hearing loss, sore throat and tinnitus.   Eyes:  Negative for blurred vision and double vision.  Respiratory:  Positive for shortness of breath. Negative for cough, hemoptysis, sputum production, wheezing and stridor.   Cardiovascular:  Negative for chest pain, palpitations, orthopnea, leg swelling and PND.  Gastrointestinal:  Negative for abdominal pain, constipation, diarrhea, heartburn, nausea and vomiting.  Genitourinary:  Negative for dysuria, hematuria and urgency.  Musculoskeletal:  Negative for joint pain and myalgias.  Skin:  Negative for itching and rash.  Neurological:  Negative for dizziness,  tingling, weakness and headaches.  Endo/Heme/Allergies:  Negative for environmental allergies. Does not bruise/bleed easily.  Psychiatric/Behavioral:  Negative for depression. The patient is not nervous/anxious and does not have insomnia.   All other systems reviewed and are negative.    Objective:  Physical Exam Vitals reviewed.  Constitutional:      General: She is not in acute distress.    Appearance: She is well-developed.     Comments: Frail thin    HENT:       Head: Normocephalic and atraumatic.     Mouth/Throat:     Pharynx: No oropharyngeal exudate.  Eyes:     Conjunctiva/sclera: Conjunctivae normal.     Pupils: Pupils are equal, round, and reactive to light.  Neck:     Vascular: No JVD.     Trachea: No tracheal deviation.     Comments: Loss of supraclavicular fat Cardiovascular:     Rate and Rhythm: Normal rate and regular rhythm.     Heart sounds: S1 normal and S2 normal.     Comments: Distant heart tones Pulmonary:     Effort: No tachypnea or accessory muscle usage.     Breath sounds: No stridor. Decreased breath sounds (throughout all lung fields) present. No wheezing, rhonchi or rales.  Abdominal:     General: Bowel sounds are normal. There is no distension.     Palpations: Abdomen is soft.     Tenderness: There is no abdominal tenderness.  Musculoskeletal:        General: Deformity (muscle wasting ) present.  Skin:    General: Skin is warm and dry.     Capillary Refill: Capillary refill takes less than 2 seconds.     Findings: No rash.  Neurological:     Mental Status: She is alert and oriented to person, place, and time.  Psychiatric:        Behavior: Behavior normal.      Vitals:   02/20/22 1111  BP: 102/60  Pulse: (!) 108  Temp: 98.3 F (36.8 C)  TempSrc: Oral  SpO2: 99%  Weight: 125 lb 9.6 oz (57 kg)  Height: 5' (1.524 m)    99% on RA BMI Readings from Last 3 Encounters:  02/20/22 24.53 kg/m  02/17/22 24.49 kg/m  12/10/21 22.50  kg/m   Wt Readings from Last 3 Encounters:  02/20/22 125 lb 9.6 oz (57 kg)  02/17/22 125 lb 6 oz (56.9 kg)  12/10/21 115 lb 3.2 oz (52.3 kg)     CBC    Component Value Date/Time   WBC 6.7 02/12/2022 1001   WBC 6.5 10/30/2021 1428   RBC 3.37 (L) 02/12/2022 1001   HGB 10.3 (L) 02/12/2022 1001   HCT 32.8 (L) 02/12/2022 1001   PLT 228 02/12/2022 1001   MCV 97.3 02/12/2022 1001   MCH 30.6 02/12/2022 1001   MCHC 31.4 02/12/2022 1001   RDW 13.2 02/12/2022 1001   LYMPHSABS 1.1 02/12/2022 1001   MONOABS 0.9 02/12/2022 1001   EOSABS 0.1 02/12/2022 1001   BASOSABS 0.0 02/12/2022 1001     Chest Imaging: Chest x-ray November 2022: Bilateral pleural effusion. The patient's images have been independently reviewed by me.    Pulmonary Functions Testing Results:    Latest Ref Rng & Units 03/08/2018   11:01 AM  PFT Results  FVC-Pre L 2.00   FVC-Predicted Pre % 104   FVC-Post L 1.91   FVC-Predicted Post % 99   Pre FEV1/FVC % % 73   Post FEV1/FCV % % 74   FEV1-Pre L 1.47   FEV1-Predicted Pre % 99   FEV1-Post L 1.42   TLC L 2.74   TLC % Predicted % 65   RV % Predicted % 61     FeNO:   Pathology:   Echocardiogram:   Heart Catheterization:     Assessment & Plan:     ICD-10-CM   1. Pleural effusion  J90 Ambulatory referral to Pulmonology    Procedural/ Surgical Case Request: ROBOTIC ASSISTED NAVIGATIONAL BRONCHOSCOPY,  INSERTION PLEURAL DRAINAGE CATHETER    2. Adenocarcinoma of right lung, stage 4 (HCC)  C34.91 Ambulatory referral to Pulmonology    Procedural/ Surgical Case Request: ROBOTIC ASSISTED NAVIGATIONAL BRONCHOSCOPY, INSERTION PLEURAL DRAINAGE CATHETER    Novel Coronavirus, NAA ([facility])    3. Pulmonary infiltrates  R91.8        Discussion: This is a 66 year old female bilateral pleural effusion stage IV lung cancer pericardial window pericardial effusion brain metastasis.  Now has a recurrent right-sided pleural effusion had a Pleurx in the left before  left pleural effusion has resolved.  Has had worsening shortness of breath.  She also has CT imaging with radiographic evidence of infiltrates in the left upper lobe either related to ICI pneumonitis with interstitial disease or potential lymphangitic spread.  Plan: Today in the office we talked about all of the next steps regarding Pleurx catheter placement into the right chest as well as biopsies of the lung on the left infiltrates. Dr. Earlie Server like to know whether or not she has recurrence of disease or if this is ICI immune checkpoint related. Patient is agreeable to this plan. We talked about the risk benefits and alternatives. I will plan to put a Pleurx catheter in while she is asleep.  Leave this to drainage while we do the procedure on the left lung upper lobe. Registration and planning for this case will be somewhat complicated due to the fact she has an effusion.  We will likely do partial registration.  Tentative bronchoscopy date will be on 03/11/2022 We also plan to put Pleurx catheter in place at that time.   Current Outpatient Medications:    acetaminophen (TYLENOL) 325 MG tablet, Take 2 tablets (650 mg total) by mouth every 6 (six) hours. (Patient taking differently: Take 650 mg by mouth every 6 (six) hours as needed for moderate pain.), Disp: , Rfl:    fluticasone (FLONASE) 50 MCG/ACT nasal spray, Place 2 sprays into both nostrils daily., Disp: 16 g, Rfl: 6   folic acid (FOLVITE) 1 MG tablet, Take 1 tablet (1 mg total) by mouth daily., Disp: 90 tablet, Rfl: 0   hydroxychloroquine (PLAQUENIL) 200 MG tablet, TAKE 1 TABLET BY MOUTH TWICE DAILY MONDAY- FRIDAY ONLY, Disp: 120 tablet, Rfl: 0   omeprazole (PRILOSEC) 20 MG capsule, Take 20 mg by mouth daily., Disp: , Rfl:    prochlorperazine (COMPAZINE) 10 MG tablet, TAKE 1 TABLET(10 MG) BY MOUTH EVERY 6 HOURS AS NEEDED FOR NAUSEA OR VOMITING (Patient taking differently: Take 10 mg by mouth every 8 (eight) hours as needed for vomiting  or nausea.), Disp: 30 tablet, Rfl: 2   UNABLE TO FIND, Med Name  Amoclav  25 mg   twice a day for infection (ulcer) in mouth, Disp: , Rfl:    vitamin C (ASCORBIC ACID) 500 MG tablet, Take 500 mg by mouth daily., Disp: , Rfl:    VITAMIN D PO, Take 1 tablet by mouth daily., Disp: , Rfl:   I spent 42 minutes dedicated to the care of this patient on the date of this encounter to include pre-visit review of records, face-to-face time with the patient discussing conditions above, post visit ordering of testing, clinical documentation with the electronic health record, making appropriate referrals as documented, and communicating necessary findings to members of the patients care team.    Garner Nash, DO [facility] 02/20/2022 11:35 AM

## 2022-02-20 NOTE — H&P (View-Only) (Signed)
Synopsis: Referred in December 2022 for pleural effusion by Bonnita Hollow, MD  Subjective:   PATIENT ID: Susan Holder GENDER: female DOB: December 05, 1955, MRN: [id number]  Chief Complaint  Patient presents with   Follow-up    Discuss CT scans    This is a 66 year old female, stage IV lung cancer, brain metastasis, pericardial effusion status post pericardial window, history of rheumatoid arthritis RF, ANA and anti-CCP positive.  Patient currently undergoing therapy with Dr. Earlie Server from medical oncology.  Recently saw Dr. Kipp Brood in clinic with bilateral pleural effusion.  Referred today for evaluation of pleural effusion consideration for thoracentesis.  Patient is not on any blood thinners.  She does have some shortness of breath with exertion.  OV 04/12/2021: Here today for repeat office-based thoracentesis.  After last thoracentesis in the office she developed significant amount of pain and drainage.  She recovered from this after resting in the office for a few moments chest x-ray looks fine with reinflation of the lung and no evidence of ex vacuo.  Unfortunately had significant amount of pain after last drainage.  We discussed potentially undergoing a left-sided repeat thoracentesis today.  Unfortunately due to the concern of having the same symptoms she really was unsure about having this done once we set up an had already consented to move forward patient declined having it done.  We discussed the pros and cons of leaving the fluid in the chest.  She ultimately decided on having a Pleurx catheter placed on the right side to see if she could be able to drain this slowly at home without having these significant symptoms.  OV 02/20/2022: Here today for follow-up patient was diagnosed with stage IV lung cancer.  Evidence of brain metastasis.  She was referred back to see me for worsening right-sided pleural effusion.Patient was recently seen by Dr. Earlie Server on 02/17/2022.  Patient is  currently undergoing treatments with immunotherapy durvalumab.  Recommended for her to have a repeat evaluation here in the office for consideration of drainage of the right pleural fluid and consideration for bronchoscopy to the opacities in the left lung.  Dr. Earlie Server is concerned for lymphangitic spread in the left lung.  Recent CT scan of the chest on 02/12/2022 shows postradiation changes in the right lung large chronic right-sided pleural effusion.  Possibility of lymphangitic spread in the left lung is not excluded other concerns for potential cryptogenic organizing pneumonia versus immune checkpoint inhibitor related disease with worsening interstitial findings could explain left lung disease.     Past Medical History:  Diagnosis Date   Acid reflux 09/21/2018   Adenocarcinoma of right lung, stage 4 ([place]) 11/17/2017   Allergic rhinitis 08/22/2013   ANA positive 05/29/2016   Aortic atherosclerosis ([place]) 10/23/2020   Brain metastases 03/29/2019   COPD (chronic obstructive pulmonary disease) ([place])    Coronary artery calcification seen on CT scan 10/23/2020   DDD (degenerative disc disease), cervical 05/29/2016   Dyspnea    Encounter for antineoplastic chemotherapy 11/17/2017   Encounter for antineoplastic immunotherapy 02/01/2018   Goals of care, counseling/discussion 11/17/2017   High risk medication use 05/29/2016   04/29/2016: ==> plq 200 am & 100 qhs(adeq response).   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) 11/04/2018   Other fatigue 05/29/2016   Pericardial effusion  10/23/2020   Primary malignant neoplasm of bronchus of right lower lobe (HCC) 12/21/2017   Rheumatoid arthritis (HCC)    Rheumatoid arthritis involving multiple sites with positive rheumatoid factor (HCC) 05/29/2016   +RF +ANA +CCP    S/P  pericardial window creation 02/07/2021   Smoker 05/29/2016   Trigger finger, left ring finger 05/29/2016   Trigger finger, right ring finger 05/29/2016   Vitamin D deficiency 05/29/2016     Family History  Problem Relation Age of Onset   Stroke Mother    Alzheimer's disease Mother    Heart disease Mother    Emphysema Father    Hypertension Brother    Heart attack Maternal Aunt    Heart failure Maternal Grandmother    Hypertension Paternal Grandmother      Past Surgical History:  Procedure Laterality Date   BRONCHIAL NEEDLE ASPIRATION BIOPSY  11/09/2017   Procedure: BRONCHIAL NEEDLE ASPIRATION BIOPSIES;  Surgeon: Mannam, Praveen, MD;  Location: [facility];  Service: Cardiopulmonary;;   CHEST TUBE INSERTION Left 05/13/2021   Procedure: INSERTION PLEURAL DRAINAGE CATHETER;  Surgeon: Lightfoot, Harrell O, MD;  Location: [facility];  Service: Thoracic;  Laterality: Left;   ENDOBRONCHIAL ULTRASOUND Bilateral 11/09/2017   Procedure: ENDOBRONCHIAL ULTRASOUND;  Surgeon: Mannam, Praveen, MD;  Location: [facility];  Service: Cardiopulmonary;  Laterality: Bilateral;   NASAL SINUS SURGERY     NASAL SINUS SURGERY     THORACENTESIS Right 02/07/2021   Procedure: THORACENTESIS;  Surgeon: Lightfoot, Harrell O, MD;  Location: [facility];  Service: Thoracic;  Laterality: Right;   TUBAL LIGATION     VIDEO BRONCHOSCOPY  11/09/2017   Procedure: VIDEO BRONCHOSCOPY;  Surgeon: Mannam, Praveen, MD;  Location: [facility];  Service: Cardiopulmonary;;   XI ROBOTIC ASSISTED PERICARDIAL WINDOW Left 02/07/2021   Procedure: XI ROBOTIC ASSISTED THORACOSCOPY PERICARDIAL WINDOW;  Surgeon: Lightfoot, Harrell O, MD;  Location: [facility];  Service: Thoracic;  Laterality: Left;    Social History   Socioeconomic History   Marital status: Divorced    Spouse name: Not on file   Number of children: 1   Years of education: Not on file   Highest education level: Not on file  Occupational History   Not on file  Tobacco Use    Smoking status: Former    Packs/day: 0.50    Years: 45.00    Total pack years: 22.50    Types: Cigarettes    Quit date: 09/29/2017    Years since quitting: 4.3   Smokeless tobacco: Never  Vaping Use   Vaping Use: Never used  Substance and Sexual Activity   Alcohol use: Not Currently   Drug use: No   Sexual activity: Not Currently  Other Topics Concern   Not on file  Social History Narrative   Not on file   Social Determinants of Health   Financial Resource Strain: Low Risk  (12/03/2021)   Overall Financial Resource Strain (CARDIA)    Difficulty of Paying Living Expenses: Not hard at all  Food Insecurity: No Food Insecurity (11/06/2020)   Hunger Vital Sign    Worried About Running Out of Food in the Last Year: Never true    Ran Out of Food in the Last Year: Never true  Transportation Needs: No Transportation Needs (12/03/2021)   PRAPARE - Transportation    Lack of Transportation (Medical): No    Lack of Transportation (Non-Medical): No  Physical Activity: Inactive (12/03/2021)   Exercise Vital Sign    Days of Exercise per Week: 0 days      Minutes of Exercise per Session: 0 min  Stress: No Stress Concern Present (12/03/2021)   [employer] - Occupational Stress Questionnaire    Feeling of Stress : Not at all  Social Connections: Unknown (12/03/2021)   Social Connection and Isolation Panel [NHANES]    Frequency of Communication with Friends and Family: More than three times a week    Frequency of Social Gatherings with Friends and Family: Never    Attends Religious Services: More than 4 times per year    Active Member of Clubs or Organizations: Yes    Attends Club or Organization Meetings: More than 4 times per year    Marital Status: Not on file  Intimate Partner Violence: Not At Risk (12/03/2021)   Humiliation, Afraid, Rape, and Kick questionnaire    Fear of Current or Ex-Partner: No    Emotionally Abused: No    Physically Abused: No    Sexually Abused:  No     No Known Allergies   Outpatient Medications Prior to Visit  Medication Sig Dispense Refill   acetaminophen (TYLENOL) 325 MG tablet Take 2 tablets (650 mg total) by mouth every 6 (six) hours. (Patient taking differently: Take 650 mg by mouth every 6 (six) hours as needed for moderate pain.)     fluticasone (FLONASE) 50 MCG/ACT nasal spray Place 2 sprays into both nostrils daily. 16 g 6   folic acid (FOLVITE) 1 MG tablet Take 1 tablet (1 mg total) by mouth daily. 90 tablet 0   hydroxychloroquine (PLAQUENIL) 200 MG tablet TAKE 1 TABLET BY MOUTH TWICE DAILY MONDAY- FRIDAY ONLY 120 tablet 0   omeprazole (PRILOSEC) 20 MG capsule Take 20 mg by mouth daily.     prochlorperazine (COMPAZINE) 10 MG tablet TAKE 1 TABLET(10 MG) BY MOUTH EVERY 6 HOURS AS NEEDED FOR NAUSEA OR VOMITING (Patient taking differently: Take 10 mg by mouth every 8 (eight) hours as needed for vomiting or nausea.) 30 tablet 2   UNABLE TO FIND Med Name  Amoclav  25 mg   twice a day for infection (ulcer) in mouth     vitamin C (ASCORBIC ACID) 500 MG tablet Take 500 mg by mouth daily.     VITAMIN D PO Take 1 tablet by mouth daily.     No facility-administered medications prior to visit.    Review of Systems  Constitutional:  Positive for malaise/fatigue. Negative for chills, fever and weight loss.  HENT:  Negative for hearing loss, sore throat and tinnitus.   Eyes:  Negative for blurred vision and double vision.  Respiratory:  Positive for shortness of breath. Negative for cough, hemoptysis, sputum production, wheezing and stridor.   Cardiovascular:  Negative for chest pain, palpitations, orthopnea, leg swelling and PND.  Gastrointestinal:  Negative for abdominal pain, constipation, diarrhea, heartburn, nausea and vomiting.  Genitourinary:  Negative for dysuria, hematuria and urgency.  Musculoskeletal:  Negative for joint pain and myalgias.  Skin:  Negative for itching and rash.  Neurological:  Negative for dizziness,  tingling, weakness and headaches.  Endo/Heme/Allergies:  Negative for environmental allergies. Does not bruise/bleed easily.  Psychiatric/Behavioral:  Negative for depression. The patient is not nervous/anxious and does not have insomnia.   All other systems reviewed and are negative.    Objective:  Physical Exam Vitals reviewed.  Constitutional:      General: She is not in acute distress.    Appearance: She is well-developed.     Comments: Frail thin    HENT:       Head: Normocephalic and atraumatic.     Mouth/Throat:     Pharynx: No oropharyngeal exudate.  Eyes:     Conjunctiva/sclera: Conjunctivae normal.     Pupils: Pupils are equal, round, and reactive to light.  Neck:     Vascular: No JVD.     Trachea: No tracheal deviation.     Comments: Loss of supraclavicular fat Cardiovascular:     Rate and Rhythm: Normal rate and regular rhythm.     Heart sounds: S1 normal and S2 normal.     Comments: Distant heart tones Pulmonary:     Effort: No tachypnea or accessory muscle usage.     Breath sounds: No stridor. Decreased breath sounds (throughout all lung fields) present. No wheezing, rhonchi or rales.  Abdominal:     General: Bowel sounds are normal. There is no distension.     Palpations: Abdomen is soft.     Tenderness: There is no abdominal tenderness.  Musculoskeletal:        General: Deformity (muscle wasting ) present.  Skin:    General: Skin is warm and dry.     Capillary Refill: Capillary refill takes less than 2 seconds.     Findings: No rash.  Neurological:     Mental Status: She is alert and oriented to person, place, and time.  Psychiatric:        Behavior: Behavior normal.      Vitals:   02/20/22 1111  BP: 102/60  Pulse: (!) 108  Temp: 98.3 F (36.8 C)  TempSrc: Oral  SpO2: 99%  Weight: 125 lb 9.6 oz (57 kg)  Height: 5' (1.524 m)    99% on RA BMI Readings from Last 3 Encounters:  02/20/22 24.53 kg/m  02/17/22 24.49 kg/m  12/10/21 22.50  kg/m   Wt Readings from Last 3 Encounters:  02/20/22 125 lb 9.6 oz (57 kg)  02/17/22 125 lb 6 oz (56.9 kg)  12/10/21 115 lb 3.2 oz (52.3 kg)     CBC    Component Value Date/Time   WBC 6.7 02/12/2022 1001   WBC 6.5 10/30/2021 1428   RBC 3.37 (L) 02/12/2022 1001   HGB 10.3 (L) 02/12/2022 1001   HCT 32.8 (L) 02/12/2022 1001   PLT 228 02/12/2022 1001   MCV 97.3 02/12/2022 1001   MCH 30.6 02/12/2022 1001   MCHC 31.4 02/12/2022 1001   RDW 13.2 02/12/2022 1001   LYMPHSABS 1.1 02/12/2022 1001   MONOABS 0.9 02/12/2022 1001   EOSABS 0.1 02/12/2022 1001   BASOSABS 0.0 02/12/2022 1001     Chest Imaging: Chest x-ray November 2022: Bilateral pleural effusion. The patient's images have been independently reviewed by me.    Pulmonary Functions Testing Results:    Latest Ref Rng & Units 03/08/2018   11:01 AM  PFT Results  FVC-Pre L 2.00   FVC-Predicted Pre % 104   FVC-Post L 1.91   FVC-Predicted Post % 99   Pre FEV1/FVC % % 73   Post FEV1/FCV % % 74   FEV1-Pre L 1.47   FEV1-Predicted Pre % 99   FEV1-Post L 1.42   TLC L 2.74   TLC % Predicted % 65   RV % Predicted % 61     FeNO:   Pathology:   Echocardiogram:   Heart Catheterization:     Assessment & Plan:     ICD-10-CM   1. Pleural effusion  J90 Ambulatory referral to Pulmonology    Procedural/ Surgical Case Request: ROBOTIC ASSISTED NAVIGATIONAL BRONCHOSCOPY,  INSERTION PLEURAL DRAINAGE CATHETER    2. Adenocarcinoma of right lung, stage 4 (HCC)  C34.91 Ambulatory referral to Pulmonology    Procedural/ Surgical Case Request: ROBOTIC ASSISTED NAVIGATIONAL BRONCHOSCOPY, INSERTION PLEURAL DRAINAGE CATHETER    Novel Coronavirus, NAA ([facility])    3. Pulmonary infiltrates  R91.8        Discussion: This is a 66 year old female bilateral pleural effusion stage IV lung cancer pericardial window pericardial effusion brain metastasis.  Now has a recurrent right-sided pleural effusion had a Pleurx in the left before  left pleural effusion has resolved.  Has had worsening shortness of breath.  She also has CT imaging with radiographic evidence of infiltrates in the left upper lobe either related to ICI pneumonitis with interstitial disease or potential lymphangitic spread.  Plan: Today in the office we talked about all of the next steps regarding Pleurx catheter placement into the right chest as well as biopsies of the lung on the left infiltrates. Dr. Earlie Server like to know whether or not she has recurrence of disease or if this is ICI immune checkpoint related. Patient is agreeable to this plan. We talked about the risk benefits and alternatives. I will plan to put a Pleurx catheter in while she is asleep.  Leave this to drainage while we do the procedure on the left lung upper lobe. Registration and planning for this case will be somewhat complicated due to the fact she has an effusion.  We will likely do partial registration.  Tentative bronchoscopy date will be on 03/11/2022 We also plan to put Pleurx catheter in place at that time.   Current Outpatient Medications:    acetaminophen (TYLENOL) 325 MG tablet, Take 2 tablets (650 mg total) by mouth every 6 (six) hours. (Patient taking differently: Take 650 mg by mouth every 6 (six) hours as needed for moderate pain.), Disp: , Rfl:    fluticasone (FLONASE) 50 MCG/ACT nasal spray, Place 2 sprays into both nostrils daily., Disp: 16 g, Rfl: 6   folic acid (FOLVITE) 1 MG tablet, Take 1 tablet (1 mg total) by mouth daily., Disp: 90 tablet, Rfl: 0   hydroxychloroquine (PLAQUENIL) 200 MG tablet, TAKE 1 TABLET BY MOUTH TWICE DAILY MONDAY- FRIDAY ONLY, Disp: 120 tablet, Rfl: 0   omeprazole (PRILOSEC) 20 MG capsule, Take 20 mg by mouth daily., Disp: , Rfl:    prochlorperazine (COMPAZINE) 10 MG tablet, TAKE 1 TABLET(10 MG) BY MOUTH EVERY 6 HOURS AS NEEDED FOR NAUSEA OR VOMITING (Patient taking differently: Take 10 mg by mouth every 8 (eight) hours as needed for vomiting  or nausea.), Disp: 30 tablet, Rfl: 2   UNABLE TO FIND, Med Name  Amoclav  25 mg   twice a day for infection (ulcer) in mouth, Disp: , Rfl:    vitamin C (ASCORBIC ACID) 500 MG tablet, Take 500 mg by mouth daily., Disp: , Rfl:    VITAMIN D PO, Take 1 tablet by mouth daily., Disp: , Rfl:   I spent 42 minutes dedicated to the care of this patient on the date of this encounter to include pre-visit review of records, face-to-face time with the patient discussing conditions above, post visit ordering of testing, clinical documentation with the electronic health record, making appropriate referrals as documented, and communicating necessary findings to members of the patients care team.    Garner Nash, DO [facility] 02/20/2022 11:35 AM

## 2022-02-20 NOTE — Patient Instructions (Signed)
Thank you for visiting Dr. Valeta Harms at [facility]. Today we recommend the following:  Orders Placed This Encounter  Procedures   Procedural/ Surgical Case Request: ROBOTIC ASSISTED NAVIGATIONAL BRONCHOSCOPY, INSERTION PLEURAL DRAINAGE CATHETER   Ambulatory referral to Pulmonology   Pleural catheter insertion and bronchoscopy on 03/11/2022  Return in about 26 days (around 03/18/2022) for with Eric Form, NP,.    Please do your part to reduce the spread of COVID-19.

## 2022-02-26 ENCOUNTER — Ambulatory Visit
Admission: RE | Admit: 2022-02-26 | Discharge: 2022-02-26 | Disposition: A | Discharge status: Home / Self Care | Payer: Medicare PPO | Source: Ambulatory Visit | Attending: Radiation Oncology | Admitting: Radiation Oncology

## 2022-02-26 DIAGNOSIS — C7931 Secondary malignant neoplasm of brain: Secondary | ICD-10-CM

## 2022-02-26 MED ORDER — GADOPICLENOL 0.5 MMOL/ML IV SOLN
5.0000 mL | Freq: Once | INTRAVENOUS | Status: AC | PRN
  Administered 2022-02-26: 5 mL via INTRAVENOUS

## 2022-03-03 ENCOUNTER — Other Ambulatory Visit: Payer: Self-pay | Admitting: Radiation Therapy

## 2022-03-03 ENCOUNTER — Inpatient Hospital Stay: Payer: Medicare PPO | Attending: Physician Assistant

## 2022-03-03 ENCOUNTER — Encounter: Payer: Self-pay | Admitting: Radiation Oncology

## 2022-03-03 ENCOUNTER — Ambulatory Visit
Admission: RE | Admit: 2022-03-03 | Discharge: 2022-03-03 | Disposition: A | Discharge status: Home / Self Care | Payer: Medicare PPO | Source: Ambulatory Visit | Attending: Radiation Oncology | Admitting: Radiation Oncology

## 2022-03-03 DIAGNOSIS — C3431 Malignant neoplasm of lower lobe, right bronchus or lung: Secondary | ICD-10-CM | POA: Insufficient documentation

## 2022-03-03 DIAGNOSIS — Z923 Personal history of irradiation: Secondary | ICD-10-CM | POA: Insufficient documentation

## 2022-03-03 DIAGNOSIS — C7931 Secondary malignant neoplasm of brain: Secondary | ICD-10-CM

## 2022-03-03 DIAGNOSIS — Z87891 Personal history of nicotine dependence: Secondary | ICD-10-CM | POA: Insufficient documentation

## 2022-03-03 DIAGNOSIS — Z79899 Other long term (current) drug therapy: Secondary | ICD-10-CM | POA: Insufficient documentation

## 2022-03-03 DIAGNOSIS — Z9221 Personal history of antineoplastic chemotherapy: Secondary | ICD-10-CM | POA: Insufficient documentation

## 2022-03-03 NOTE — Progress Notes (Signed)
Radiation Oncology         [phone] ________________________________  Outpatient Follow Up - Conducted via telephone at Holder request.  I spoke with the Holder to conduct this consult visit via telephone. The Holder was notified in advance and was offered an in person or telemedicine meeting to allow for face to face communication but instead preferred to proceed with a telephone visit.   Name: Susan Holder MRN: [id number]  Date of Service: 03/03/2022  DOB: June 09, 1955   DIAGNOSIS:   Progressive Metastatic Stage IIIA, cT3N1-2M0 NSCLC, adenocarcinoma of the right lower lobe with brain metastases   INTERVAL SINCE LAST RADIOTHERAPY:   03/09/2019 SRS Treatment: Each site was treated to 20 Gy in a single fraction PTV1 Post Cerebellum 73m PTV2 Lt Cerebellum 120mPTV3 Lo Rt Vent 58m59mTV4 Rt Frontal 5mm31mV5 Lt Frontal Operc 5mm 45m6 Lt Sylv 5mm  5m1/2020-12/20/2018: The Holder's right supraclavicular nodes were treated to 60 Gy over 25 fractions.   11/30/17-01/11/18:  66 Gy to the right chest and regional nodes over 33 fractions  NARRATIVE: Susan Holder to our service with a history of adenocarcinoma of the right lower lobe.  When she was originally diagnosed in the summer 2019 she presented with local regional disease and received chemoradiation.  Unfortunately she recurred in the summer 2020, she did receive palliative radiotherapy to the right supraclavicular node, and in November 2020 was found to have 6 lesions in the brain felt to be metastatic for which she underwent SRS treatment in November 2020.    She has been followed with no evidence of disease in the brain.  Her last immunotherapy was given on 09/04/2021. Recent imaging of the chest shows concerns for inflammatory changes in the left lung base, she is going to be undergoing biopsy of this with Dr. ReichaJamey Ripa also has a right effusion that will be treated with Pleurx catheter.   Her recent MRI on 02/26/2022 showed mildly increased edema surrounding the right frontal lobe lesion currently measuring 5 mm additional lesions appeared slightly increased but in discussion in brain oncology conference it was felt that these changes were all consistent with inflammatory change.  Interestingly she had a 5 mm area of posttreatment effect in the left cerebellum on her scan in May 2023 but she did have changes in dizziness and vertigo and presented in July to the emergency department at that time a 1.5 Tesla MRI was performed and the lesion in the left cerebellum measured 7 mm.  Again we discussed the limitations but her symptoms have resolved without intervention.  She is contacted today about the results of her MRI from 02/26/2022.  On review of systems, the Holder reports that she is doing fairly well. She reports she has had some shortness of breath as a result of the pleural effusion. She has also had resolution of dizziness since the summer when she noted vertigo. She denies any headaches, changes in behavior, cognitive function, or movement. No other complaints are verbalized.   PAST MEDICAL HISTORY:  Past Medical History:  Diagnosis Date   Acid reflux 09/21/2018   Adenocarcinoma of right lung, stage 4 (HCC) 0[place]3/2019   Allergic rhinitis 08/22/2013   ANA positive 05/29/2016   Aortic atherosclerosis (HCC) 0[place]8/2022   Brain metastases 03/29/2019   COPD (chronic obstructive pulmonary disease) (HCC)    Coronary artery calcification seen on CT scan 10/23/2020   DDD (degenerative disc disease), cervical 05/29/2016   Dyspnea  Encounter for antineoplastic chemotherapy 11/17/2017   Encounter for antineoplastic immunotherapy 02/01/2018   Goals of care, counseling/discussion 11/17/2017   High risk medication use 05/29/2016   04/29/2016: ==> plq 200 am & 100 qhs(adeq response).   Hoarseness 03/16/2018   Left hand pain 03/06/2017   Lung mass    Malnutrition of moderate degree  02/09/2021   met lung ca dx'd 09/2017   neck LN and brain 2020   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[place]) 11/04/2018   Other fatigue 05/29/2016   Pericardial effusion 10/23/2020   Primary malignant neoplasm of bronchus of right lower lobe ([place]) 12/21/2017   Rheumatoid arthritis (HCC)    Rheumatoid arthritis involving multiple sites with positive rheumatoid factor ([place]) 05/29/2016   +RF +ANA +CCP    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   Smoker 05/29/2016   Trigger finger, left ring finger 05/29/2016   Trigger finger, right ring finger 05/29/2016   Vitamin D deficiency 05/29/2016    PAST SURGICAL HISTORY: Past Surgical History:  Procedure Laterality Date   BRONCHIAL NEEDLE ASPIRATION BIOPSY  11/09/2017   Procedure: BRONCHIAL NEEDLE ASPIRATION BIOPSIES;  Surgeon: Marshell Garfinkel, MD;  [facility];  Service: Cardiopulmonary;;   CHEST TUBE INSERTION Left 05/13/2021   Procedure: INSERTION PLEURAL DRAINAGE CATHETER;  Surgeon: Lajuana Matte, MD;  Location: [place];  Service: Thoracic;  Laterality: Left;   ENDOBRONCHIAL ULTRASOUND Bilateral 11/09/2017   Procedure: ENDOBRONCHIAL ULTRASOUND;  Surgeon: Marshell Garfinkel, MD;  [facility];  Service: Cardiopulmonary;  Laterality: Bilateral;   NASAL SINUS SURGERY     NASAL SINUS SURGERY     THORACENTESIS Right 02/07/2021   Procedure: THORACENTESIS;  Surgeon: Lajuana Matte, MD;  Location: [place];  Service: Thoracic;  Laterality: Right;   TUBAL LIGATION     VIDEO BRONCHOSCOPY  11/09/2017   Procedure: VIDEO BRONCHOSCOPY;  Surgeon: Marshell Garfinkel, MD;  [facility];  Service: Cardiopulmonary;;   XI ROBOTIC ASSISTED PERICARDIAL WINDOW Left 02/07/2021   Procedure: XI ROBOTIC ASSISTED THORACOSCOPY PERICARDIAL WINDOW;  Surgeon: Lajuana Matte, MD;  [facility];  Service: Thoracic;  Laterality: Left;    PAST SOCIAL HISTORY:  Social History   Socioeconomic History   Marital status: Divorced     Spouse name: Not on file   Number of children: 1   Years of education: Not on file   Highest education level: Not on file  Occupational History   Not on file  Tobacco Use   Smoking status: Former    Packs/day: 0.50    Years: 45.00    Total pack years: 22.50    Types: Cigarettes    Quit date: 09/29/2017    Years since quitting: 4.4   Smokeless tobacco: Never  Vaping Use   Vaping Use: Never used  Substance and Sexual Activity   Alcohol use: Not Currently   Drug use: No   Sexual activity: Not Currently  Other Topics Concern   Not on file  Social History Narrative   Not on file   Social Determinants of Health   Financial Resource Strain: Low Risk  (12/03/2021)   Overall Financial Resource Strain (CARDIA)    Difficulty of Paying Living Expenses: Not hard at all  Food Insecurity: No Food Insecurity (11/06/2020)   Hunger Vital Sign    Worried About Running Out of Food in the Last Year: Never true    Ran Out of Food in the Last Year: Never true  Transportation Needs: No Transportation Needs (12/03/2021)   PRAPARE -  Hydrologist (Medical): No    Lack of Transportation (Non-Medical): No  Physical Activity: Inactive (12/03/2021)   Exercise Vital Sign    Days of Exercise per Week: 0 days    Minutes of Exercise per Session: 0 min  Stress: No Stress Concern Present (12/03/2021)   [place]    Feeling of Stress : Not at all  Social Connections: Unknown (12/03/2021)   Social Connection and Isolation Panel [NHANES]    Frequency of Communication with Friends and Family: More than three times a week    Frequency of Social Gatherings with Friends and Family: Never    Attends Religious Services: More than 4 times per year    Active Member of [employer] or Organizations: Yes    Attends Archivist Meetings: More than 4 times per year    Marital Status: Not on file  Intimate Partner Violence: Not  At Risk (12/03/2021)   Humiliation, Afraid, Rape, and Kick questionnaire    Fear of Current or Ex-Partner: No    Emotionally Abused: No    Physically Abused: No    Sexually Abused: No  The Holder is divorced.  She lives in [place] and her 58 year old granddaughter is also at home with her.  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  PAST FAMILY HISTORY: Family History  Problem Relation Age of Onset   Stroke Mother    Alzheimer's disease Mother    Heart disease Mother    Emphysema Father    Hypertension Brother    Heart attack Maternal Aunt    Heart failure Maternal Grandmother    Hypertension Paternal Grandmother     MEDICATIONS  Current Outpatient Medications  Medication Sig Dispense Refill   acetaminophen (TYLENOL) 325 MG tablet Take 2 tablets (650 mg total) by mouth every 6 (six) hours. (Holder taking differently: Take 650 mg by mouth every 6 (six) hours as needed for moderate pain.)     fluticasone (FLONASE) 50 MCG/ACT nasal spray Place 2 sprays into both nostrils daily. 16 g 6   folic acid (FOLVITE) 1 MG tablet Take 1 tablet (1 mg total) by mouth daily. 90 tablet 0   hydroxychloroquine (PLAQUENIL) 200 MG tablet TAKE 1 TABLET BY MOUTH TWICE DAILY MONDAY- FRIDAY ONLY 120 tablet 0   omeprazole (PRILOSEC) 20 MG capsule Take 20 mg by mouth daily.     prochlorperazine (COMPAZINE) 10 MG tablet TAKE 1 TABLET(10 MG) BY MOUTH EVERY 6 HOURS AS NEEDED FOR NAUSEA OR VOMITING (Holder taking differently: Take 10 mg by mouth every 8 (eight) hours as needed for vomiting or nausea.) 30 tablet 2   UNABLE TO FIND Med Name  Amoclav  25 mg   twice a day for infection (ulcer) in mouth     vitamin C (ASCORBIC ACID) 500 MG tablet Take 500 mg by mouth daily.     VITAMIN D PO Take 1 tablet by mouth daily.     No current facility-administered medications for this encounter.    ALLERGIES: No Known Allergies  PHYSICAL EXAM:  Unable to assess due to encounter  type.   IMPRESSION/PLAN: 1. Progressive Metastatic Stage IIIA, cT3N1-2M0 NSCLC, adenocarcinoma of the right lower lobe with brain metastases. The Holder's scans were discussed in brain oncology conference, and it is favored that the findings are consistent with her somewhat recent immunotherapy treatments. Dr. Lisbeth Renshaw has recommended repeat MRI in 4 months time, but managing any  symptoms she develops from edema with steroids. At this time she's asymptomatic but she will let us know if there are any changes in her function. She will follow up with Dr. Julien Nordmann as well.  2. Pleural effusion.  She will follow up with Dr. Valeta Harms regarding this and the findings in the left lung on 03/11/22. We will follow this expectantly.   This encounter was conducted via telephone.  The Holder has provided two factor identification and has given verbal consent for this type of encounter and has been advised to only accept a meeting of this type in a secure network environment. The time spent during this encounter was 35 minutes including preparation, discussion, and coordination of the Holder's care. The attendants for this meeting include Shona Simpson, [facility] and Farrel Gobble.  During the encounter, Shona Simpson [facility] was located in the Radiation Department at the [facility]. Susan Holder was located remotely at home.    Carola Rhine, PAC

## 2022-03-03 NOTE — Progress Notes (Addendum)
Follow-up telephone appointment for 03/03/22 completed by Shona Simpson PA-C.

## 2022-03-03 NOTE — Addendum Note (Signed)
Encounter addended by: Mollie Germany, LPN on: 78/07/1280 0:81 AM  Actions taken: Clinical Note Signed

## 2022-03-04 ENCOUNTER — Other Ambulatory Visit: Payer: Self-pay

## 2022-03-07 ENCOUNTER — Encounter (HOSPITAL_COMMUNITY): Payer: Self-pay | Admitting: Student

## 2022-03-07 ENCOUNTER — Other Ambulatory Visit: Payer: Self-pay

## 2022-03-07 NOTE — Progress Notes (Signed)
PCP - Dr Josephine Igo Cardiologist - Dr Melodie Bouillon Rheumatologist - Dr Bo Merino Oncology - Dr Julien Nordmann  CT Chest x-ray - 02/12/22 EKG - 10/30/21 Stress Test - n/a ECHO - 11/09/20 Cardiac Cath - n/a  ICD Pacemaker/Loop - n/a  Sleep Study -  n/a CPAP - none  Anesthesia review: Yes  STOP now taking any Aspirin (unless otherwise instructed by your surgeon), Aleve, Naproxen, Ibuprofen, Motrin, Advil, Goody's, BC's, all herbal medications, fish oil, and all vitamins.   Coronavirus Screening Do you have any of the following symptoms:  Cough Yes Fever (>100.55F)  yes/no: No Runny nose yes/no: No Sore throat yes/no: No Difficulty breathing/shortness of breath  yes/no: Yes  Have you traveled in the last 14 days and where? yes/no: No  Patient verbalized understanding of instructions that were given to them via phone.

## 2022-03-10 NOTE — Progress Notes (Signed)
Anesthesia Chart Review: SAME DAY WORK-UP  Case: [id number] Date/Time: 03/11/22 0915   Procedures:      ROBOTIC ASSISTED NAVIGATIONAL BRONCHOSCOPY (Left)     INSERTION PLEURAL DRAINAGE CATHETER (Right) - indwelling pleural catheter, w/ cuff   Anesthesia type: General   Diagnosis:      Pleural effusion [J90]     Adenocarcinoma of right lung, stage 4 (HCC) [C34.91]   Pre-op diagnosis: pleural effusion, pulmonary infiltrates, stage 4 lung cancer   [facility] / [facility]   Surgeons: Maryjane Hurter, MD       DISCUSSION: Patient is a 66 year old female scheduled for the above procedure. Known stage IV lung cancer diagnosed 2019, s/p chemoradiation including to brain. Left Pleurx 05/13/21 and now with recurrent right pleural effusion. Per 02/17/22 oncology note by Dr. Julien Nordmann, "Her scan showed no concerning findings for disease progression but there was evolving changes throughout the left lung suspicious for lymphangitic spread of disease versus inflammatory process from immunotherapy or cryptogenic organizing pneumonia.  She also has persistently moderately large right pleural effusion. I recommended for the patient to see Dr. Valeta Harms for evaluation and consideration of drainage of the right pleural effusion as well as consideration of bronchoscopy to evaluate the opacity in the left lung."  Other history includes former smoker (quit 09/29/17), COPD, stage IV lung non-small cell lung cancer (initially diagnosed as stage IIIA 10/2017 s/p chemotherapy & right lung radiation 11/30/17-01/11/18; stage IV ~ 10/2018, s/p right supraclavicular radiation 11/16/18-12/20/18 & SRS for brain mets 03/09/19; s/p left VATS/pericardial window and right pleurocentesis 02/07/21; left Pleurx catheter 05/13/21, last immunotherapy 09/04/21 cancer), RA, aortic and coronary atherosclerosis (on imaging), GERD, anemia.  Last visit with cardiologist Dr. Geraldo Pitter was on 11/21/20 aortic atherosclerosis and coronary  calcifications on imaging, pericardial effusion in setting of stage IV lung cancer. He referred her to CT surgery for consideration of pericardial window which she had on 02/07/21.    She is a same day work-up. Last labs noted are from 02/12/22.  Anesthesia team to evaluate on the day of surgery.  Presurgical COVID-19 test pending.   VS: Ht 5' (1.524 m)   Wt 57.1 kg   LMP 04/28/2000   BMI 24.60 kg/m  BP Readings from Last 3 Encounters:  02/20/22 102/60  02/17/22 (!) 145/58  12/10/21 118/74   Pulse Readings from Last 3 Encounters:  02/20/22 (!) 108  02/17/22 72  12/10/21 (!) 103     PROVIDERS: Bonnita Hollow, MD is PCP [facility]) Revankar, Sunny Schlein, MD is cardiologist  Curt Bears, MD is HEM-ONC Kyung Rudd, MD is RAD-ONC Bo Merino, MD is rheumatologist Melodie Bouillon, MD is CT surgeon   LABS: Most recent labs in [facility] include: Lab Results  Component Value Date   WBC 6.7 02/12/2022   HGB 10.3 (L) 02/12/2022   HCT 32.8 (L) 02/12/2022   PLT 228 02/12/2022   GLUCOSE 82 02/12/2022   ALT 7 02/12/2022   AST 18 02/12/2022   NA 138 02/12/2022   K 4.6 02/12/2022   CL 102 02/12/2022   CREATININE 1.22 (H) 02/12/2022   BUN 19 02/12/2022   CO2 31 02/12/2022   TSH 2.833 02/12/2022   INR 1.0 10/30/2021    IMAGES: MRI Brain 02/26/22: IMPRESSION: 1. Differences in technique limit direct comparison; however, suspect mildly increased size of a 5 mm lesion inferior right frontal lobe (detailed above) with mildly increased surrounding edema. 2. Additional lesions appear similar versus slightly increased, detailed above.  CT Chest/abd/pelvis 02/12/22: IMPRESSION: 1. Stable postradiation changes in the right lung with large chronic right pleural effusion, but no definitive findings to suggest locally recurrent disease or definite metastatic disease in the thorax. 2. However, there are evolving changes throughout the left lung which are unusual.  Strictly speaking, the possibility of lymphangitic spread of disease in the left lung is not excluded, but not strongly favored. Primary differential considerations are that of drug reaction such as cryptogenic organizing pneumonia (COP) (as can be seen with immune check point inhibitors such as Keytruda) or progressive worsening interstitial lung disease. Further clinical evaluation is recommended. 3. Aortic atherosclerosis, in addition to left main and 2 vessel coronary artery disease. Please note that although the presence of coronary artery calcium documents the presence of coronary artery disease, the severity of this disease and any potential stenosis cannot be assessed on this non-gated CT examination. Assessment for potential risk factor modification, dietary therapy or pharmacologic therapy may be warranted, if clinically indicated. 4. Hepatic steatosis. 5. Colonic diverticulosis without evidence of acute diverticulitis at this time.  MRI Brain/MRA Head/Neck 10/30/21: MPRESSION: 1. Unchanged appearance of 4 intraparenchymal metastases. 2. No acute intracranial abnormality. 3. Normal MRA of the head and neck.    EKG: 10/30/21:  Sinus tachycardia at 101 bpm Borderline T wave abnormalities Confirmed by Octaviano Glow [phone]) on 10/30/2021 4:34:07 PM   CV:  Echo 11/09/20: IMPRESSIONS   1. Left ventricular ejection fraction, by estimation, is 40 to 45%. The  left ventricle has mildly decreased function. The left ventricle  demonstrates global hypokinesis. The left ventricular internal cavity size  was mildly dilated. Left ventricular  diastolic parameters are consistent with Grade I diastolic dysfunction  (impaired relaxation).   2. Right ventricular systolic function is moderately reduced. The right  ventricular size is normal. There is normal pulmonary artery systolic  pressure.   3. There is a moderate circumferential pericardial effusion. There is no  collapse of the RV  or RA. The MV inflow pattern is normal. There is no  evidence of pericardial tamponade but I would recommend close clinical  follow up for further evaluation . Marland Kitchen  Moderate pericardial effusion. The pericardial effusion is  circumferential. There is no evidence of cardiac tamponade.   4. The mitral valve is grossly normal. No evidence of mitral valve  regurgitation.   5. The aortic valve is grossly normal. Aortic valve regurgitation is  moderate.   6. The IVC is normal size. There is inspiratory collapse of the IVC. Marland Kitchen  The inferior vena cava is normal in size with greater than 50% respiratory  variability, suggesting right atrial pressure of 3 mmHg.  (She was referred to CT surgery by cardiologist Dr. Lennox Pippins. S/p pericardial window and right pleurocentesis 02/07/2021)    Past Medical History:  Diagnosis Date   Acid reflux 09/21/2018   Adenocarcinoma of right lung, stage 4 ([place]) 11/17/2017   ADHD (attention deficit hyperactivity disorder)    patient denies this dx as of 03/10/22   Allergic rhinitis 08/22/2013   ANA positive 05/29/2016   Anemia    Aortic atherosclerosis ([place]) 10/23/2020   Brain metastases 03/29/2019   COPD (chronic obstructive pulmonary disease) ([place])    Coronary artery calcification seen on CT scan 10/23/2020   patient is unaware of this dx as of 03/10/22   DDD (degenerative disc disease), cervical 05/29/2016   patient denies this dx as of 03/10/22   Dyspnea    Encounter for antineoplastic chemotherapy 11/17/2017   Encounter  for antineoplastic immunotherapy 02/01/2018   Goals of care, counseling/discussion 11/17/2017   High risk medication use 05/29/2016   04/29/2016: ==> plq 200 am & 100 qhs(adeq response).   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[place])  11/04/2018   Other fatigue 05/29/2016   Pericardial effusion 10/23/2020   Pneumonia    as a child   Primary malignant neoplasm of bronchus of right lower lobe ([place]) 12/21/2017   Rheumatoid arthritis ([place])    Rheumatoid arthritis involving multiple sites with positive rheumatoid factor ([place]) 05/29/2016   +RF +ANA +CCP    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   Smoker 05/29/2016   quit 2019   Trigger finger, left ring finger 05/29/2016   Trigger finger, right ring finger 05/29/2016   Vitamin D deficiency 05/29/2016    Past Surgical History:  Procedure Laterality Date   BRONCHIAL NEEDLE ASPIRATION BIOPSY  11/09/2017   Procedure: BRONCHIAL NEEDLE ASPIRATION BIOPSIES;  Surgeon: Marshell Garfinkel, MD;  [facility];  Service: Cardiopulmonary;;   CHEST TUBE INSERTION Left 05/13/2021   Procedure: INSERTION PLEURAL DRAINAGE CATHETER;  Surgeon: Lajuana Matte, MD;  Location: [place];  Service: Thoracic;  Laterality: Left;   ENDOBRONCHIAL ULTRASOUND Bilateral 11/09/2017   Procedure: ENDOBRONCHIAL ULTRASOUND;  Surgeon: Marshell Garfinkel, MD;  [facility];  Service: Cardiopulmonary;  Laterality: Bilateral;   NASAL SINUS SURGERY     NASAL SINUS SURGERY     THORACENTESIS Right 02/07/2021   Procedure: THORACENTESIS;  Surgeon: Lajuana Matte, MD;  Location: [place];  Service: Thoracic;  Laterality: Right;   TUBAL LIGATION     VIDEO BRONCHOSCOPY  11/09/2017   Procedure: VIDEO BRONCHOSCOPY;  Surgeon: Marshell Garfinkel, MD;  [facility];  Service: Cardiopulmonary;;   XI ROBOTIC ASSISTED PERICARDIAL WINDOW Left 02/07/2021   Procedure: XI ROBOTIC ASSISTED THORACOSCOPY PERICARDIAL WINDOW;  Surgeon: Lajuana Matte, MD;  Location: [place];  Service: Thoracic;  Laterality: Left;    MEDICATIONS: No current facility-administered medications for this encounter.    acetaminophen (TYLENOL) 325 MG tablet   Ascorbic Acid (VITAMIN C) 250 MG CHEW   Cholecalciferol  (VITAMIN D-3) 125 MCG (5000 UT) TABS   fluticasone (FLONASE) 50 MCG/ACT nasal spray   folic acid (FOLVITE) 1 MG tablet   hydroxychloroquine (PLAQUENIL) 200 MG tablet   omeprazole (PRILOSEC) 20 MG capsule   prochlorperazine (COMPAZINE) 10 MG tablet   UNABLE TO FIND    Myra Gianotti, PA-C Surgical Short Stay/Anesthesiology [facility] Phone [phone] [facility] Phone [phone] 03/10/2022 11:03 AM

## 2022-03-10 NOTE — Anesthesia Preprocedure Evaluation (Addendum)
Anesthesia Evaluation  Patient identified by MRN, date of birth, ID band Patient awake    Reviewed: Allergy & Precautions, NPO status , Patient's Chart, lab work & pertinent test results  Airway Mallampati: II  TM Distance: >3 FB Neck ROM: Full    Dental  (+) Teeth Intact, Dental Advisory Given   Pulmonary COPD (2L O2 [place]),  oxygen dependent, former smoker Pleural effusion Adenocarcinoma of right lung, stage 4      + decreased breath sounds      Cardiovascular + CAD  Normal cardiovascular exam Rhythm:Regular Rate:Normal     Neuro/Psych Brain mets  negative neurological ROS     GI/Hepatic Neg liver ROS,GERD  ,,  Endo/Other  negative endocrine ROS    Renal/GU negative Renal ROS     Musculoskeletal  (+) Arthritis , Rheumatoid disorders,    Abdominal   Peds  (+) ADHD Hematology negative hematology ROS (+)   Anesthesia Other Findings Day of surgery medications reviewed with the patient.  Reproductive/Obstetrics                              Anesthesia Physical Anesthesia Plan  ASA: 4  Anesthesia Plan: General   Post-op Pain Management: Tylenol PO (pre-op)*   Induction: Intravenous  PONV Risk Score and Plan: 3 and Dexamethasone, Ondansetron and Treatment may vary due to age or medical condition  Airway Management Planned: Oral ETT  Additional Equipment:   Intra-op Plan:   Post-operative Plan: Extubation in OR  Informed Consent: I have reviewed the patients History and Physical, chart, labs and discussed the procedure including the risks, benefits and alternatives for the proposed anesthesia with the patient or authorized representative who has indicated his/her understanding and acceptance.     Dental advisory given  Plan Discussed with: CRNA  Anesthesia Plan Comments: (PAT note written 03/10/2022 by Myra Gianotti, PA-C.  )        Anesthesia Quick Evaluation

## 2022-03-11 ENCOUNTER — Encounter (HOSPITAL_COMMUNITY): Payer: Self-pay | Admitting: Student

## 2022-03-11 ENCOUNTER — Ambulatory Visit (HOSPITAL_BASED_OUTPATIENT_CLINIC_OR_DEPARTMENT_OTHER): Payer: Medicare PPO | Admitting: Vascular Surgery

## 2022-03-11 ENCOUNTER — Ambulatory Visit (HOSPITAL_COMMUNITY)
Admission: RE | Admit: 2022-03-11 | Discharge: 2022-03-11 | Disposition: A | Discharge status: Home / Self Care | Payer: Medicare PPO | Attending: Student | Admitting: Student

## 2022-03-11 ENCOUNTER — Ambulatory Visit (HOSPITAL_COMMUNITY): Payer: Medicare PPO

## 2022-03-11 ENCOUNTER — Ambulatory Visit (HOSPITAL_COMMUNITY): Payer: Medicare PPO | Admitting: Vascular Surgery

## 2022-03-11 ENCOUNTER — Encounter (HOSPITAL_COMMUNITY)
Admission: RE | Disposition: A | Discharge status: Home / Self Care | Payer: Self-pay | Source: Home / Self Care | Attending: Student

## 2022-03-11 ENCOUNTER — Other Ambulatory Visit: Payer: Self-pay

## 2022-03-11 DIAGNOSIS — Z1152 Encounter for screening for COVID-19: Secondary | ICD-10-CM | POA: Diagnosis not present

## 2022-03-11 DIAGNOSIS — C3491 Malignant neoplasm of unspecified part of right bronchus or lung: Secondary | ICD-10-CM | POA: Insufficient documentation

## 2022-03-11 DIAGNOSIS — I2510 Atherosclerotic heart disease of native coronary artery without angina pectoris: Secondary | ICD-10-CM | POA: Diagnosis not present

## 2022-03-11 DIAGNOSIS — J449 Chronic obstructive pulmonary disease, unspecified: Secondary | ICD-10-CM | POA: Diagnosis not present

## 2022-03-11 DIAGNOSIS — K219 Gastro-esophageal reflux disease without esophagitis: Secondary | ICD-10-CM | POA: Insufficient documentation

## 2022-03-11 DIAGNOSIS — J189 Pneumonia, unspecified organism: Secondary | ICD-10-CM | POA: Diagnosis not present

## 2022-03-11 DIAGNOSIS — Z9981 Dependence on supplemental oxygen: Secondary | ICD-10-CM | POA: Diagnosis not present

## 2022-03-11 DIAGNOSIS — J984 Other disorders of lung: Secondary | ICD-10-CM | POA: Diagnosis not present

## 2022-03-11 DIAGNOSIS — C7931 Secondary malignant neoplasm of brain: Secondary | ICD-10-CM | POA: Insufficient documentation

## 2022-03-11 DIAGNOSIS — J90 Pleural effusion, not elsewhere classified: Secondary | ICD-10-CM | POA: Diagnosis not present

## 2022-03-11 DIAGNOSIS — C3490 Malignant neoplasm of unspecified part of unspecified bronchus or lung: Secondary | ICD-10-CM | POA: Diagnosis not present

## 2022-03-11 DIAGNOSIS — R918 Other nonspecific abnormal finding of lung field: Secondary | ICD-10-CM | POA: Diagnosis not present

## 2022-03-11 DIAGNOSIS — I509 Heart failure, unspecified: Secondary | ICD-10-CM | POA: Diagnosis not present

## 2022-03-11 DIAGNOSIS — Z87891 Personal history of nicotine dependence: Secondary | ICD-10-CM

## 2022-03-11 HISTORY — PX: BRONCHIAL WASHINGS: SHX5105

## 2022-03-11 HISTORY — DX: Anemia, unspecified: D64.9

## 2022-03-11 HISTORY — DX: Attention-deficit hyperactivity disorder, unspecified type: F90.9

## 2022-03-11 HISTORY — PX: CHEST TUBE INSERTION: SHX231

## 2022-03-11 HISTORY — DX: Personal history of other medical treatment: Z92.89

## 2022-03-11 HISTORY — PX: VIDEO BRONCHOSCOPY: SHX5072

## 2022-03-11 HISTORY — DX: Pneumonia, unspecified organism: J18.9

## 2022-03-11 HISTORY — PX: BRONCHIAL BIOPSY: SHX5109

## 2022-03-11 LAB — SARS CORONAVIRUS 2 BY RT PCR: SARS Coronavirus 2 by RT PCR: NEGATIVE

## 2022-03-11 LAB — BODY FLUID CELL COUNT WITH DIFFERENTIAL
Eos, Fluid: 4 %
Lymphs, Fluid: 63 %
Monocyte-Macrophage-Serous Fluid: 13 % — ABNORMAL LOW (ref 50–90)
Neutrophil Count, Fluid: 20 % (ref 0–25)
Total Nucleated Cell Count, Fluid: 34 cu mm (ref 0–1000)

## 2022-03-11 SURGERY — BRONCHOSCOPY, WITH FLUOROSCOPY
Anesthesia: General | Laterality: Right

## 2022-03-11 MED ORDER — PROPOFOL 10 MG/ML IV BOLUS
INTRAVENOUS | Status: DC | PRN
  Administered 2022-03-11: 10 mg via INTRAVENOUS
  Administered 2022-03-11: 130 mg via INTRAVENOUS
  Administered 2022-03-11: 10 mg via INTRAVENOUS

## 2022-03-11 MED ORDER — ONDANSETRON HCL 4 MG/2ML IJ SOLN
INTRAMUSCULAR | Status: DC | PRN
  Administered 2022-03-11: 4 mg via INTRAVENOUS

## 2022-03-11 MED ORDER — ROCURONIUM BROMIDE 10 MG/ML (PF) SYRINGE
PREFILLED_SYRINGE | INTRAVENOUS | Status: DC | PRN
  Administered 2022-03-11: 50 mg via INTRAVENOUS

## 2022-03-11 MED ORDER — PROPOFOL 500 MG/50ML IV EMUL
INTRAVENOUS | Status: DC | PRN
  Administered 2022-03-11: 100 ug/kg/min via INTRAVENOUS

## 2022-03-11 MED ORDER — PHENYLEPHRINE HCL-NACL 20-0.9 MG/250ML-% IV SOLN
INTRAVENOUS | Status: DC | PRN
  Administered 2022-03-11: 100 ug/min via INTRAVENOUS

## 2022-03-11 MED ORDER — ONDANSETRON HCL 4 MG/2ML IJ SOLN: 4.0000 mg | Freq: Once | INTRAMUSCULAR | Status: DC | PRN

## 2022-03-11 MED ORDER — PHENYLEPHRINE 80 MCG/ML (10ML) SYRINGE FOR IV PUSH (FOR BLOOD PRESSURE SUPPORT)
PREFILLED_SYRINGE | INTRAVENOUS | Status: DC | PRN
  Administered 2022-03-11: 160 ug via INTRAVENOUS
  Administered 2022-03-11: 240 ug via INTRAVENOUS

## 2022-03-11 MED ORDER — LIDOCAINE 2% (20 MG/ML) 5 ML SYRINGE
INTRAMUSCULAR | Status: DC | PRN
  Administered 2022-03-11: 80 mg via INTRAVENOUS

## 2022-03-11 MED ORDER — CHLORHEXIDINE GLUCONATE 0.12 % MT SOLN
15.0000 mL | Freq: Once | OROMUCOSAL | Status: AC
  Administered 2022-03-11: 15 mL via OROMUCOSAL

## 2022-03-11 MED ORDER — FENTANYL CITRATE (PF) 100 MCG/2ML IJ SOLN
INTRAMUSCULAR | Status: DC | PRN
  Administered 2022-03-11 (×2): 50 ug via INTRAVENOUS

## 2022-03-11 MED ORDER — FENTANYL CITRATE (PF) 100 MCG/2ML IJ SOLN
25.0000 ug | INTRAMUSCULAR | Status: DC | PRN
  Administered 2022-03-11 (×2): 50 ug via INTRAVENOUS

## 2022-03-11 MED ORDER — ACETAMINOPHEN 500 MG PO TABS
1000.0000 mg | ORAL_TABLET | Freq: Once | ORAL | Status: AC
  Administered 2022-03-11: 1000 mg via ORAL

## 2022-03-11 MED ORDER — FENTANYL CITRATE (PF) 100 MCG/2ML IJ SOLN
INTRAMUSCULAR | Status: AC
  Filled 2022-03-11: qty 2

## 2022-03-11 MED ORDER — ACETAMINOPHEN 500 MG PO TABS: 1000.0000 mg | ORAL_TABLET | Freq: Once | ORAL | Status: DC

## 2022-03-11 MED ORDER — MIDAZOLAM HCL 5 MG/5ML IJ SOLN
INTRAMUSCULAR | Status: DC | PRN
  Administered 2022-03-11: 2 mg via INTRAVENOUS

## 2022-03-11 MED ORDER — CHLORHEXIDINE GLUCONATE 0.12 % MT SOLN
OROMUCOSAL | Status: AC
  Filled 2022-03-11: qty 15

## 2022-03-11 MED ORDER — DEXAMETHASONE SODIUM PHOSPHATE 10 MG/ML IJ SOLN
INTRAMUSCULAR | Status: DC | PRN
  Administered 2022-03-11: 4 mg via INTRAVENOUS

## 2022-03-11 MED ORDER — SUGAMMADEX SODIUM 200 MG/2ML IV SOLN
INTRAVENOUS | Status: DC | PRN
  Administered 2022-03-11: 200 mg via INTRAVENOUS

## 2022-03-11 MED ORDER — LACTATED RINGERS IV SOLN: INTRAVENOUS | Status: DC

## 2022-03-11 MED ORDER — ACETAMINOPHEN 500 MG PO TABS
ORAL_TABLET | ORAL | Status: AC
  Filled 2022-03-11: qty 2

## 2022-03-11 NOTE — Transfer of Care (Signed)
Immediate Anesthesia Transfer of Care Note  Patient: SHIYA FOGELMAN  Procedure(s) Performed: VIDEO BRONCHOSCOPY WITH FLUORO (Left) INSERTION PLEURAL DRAINAGE CATHETER (Right) BRONCHIAL WASHINGS BRONCHIAL BIOPSIES  Patient Location: PACU  Anesthesia Type:General  Level of Consciousness: awake, alert , and oriented  Airway & Oxygen Therapy: Patient Spontanous Breathing and Patient connected to nasal cannula oxygen  Post-op Assessment: Report given to RN and Post -op Vital signs reviewed and stable  Post vital signs: Reviewed and stable  Last Vitals:  Vitals Value Taken Time  BP 105/54 03/11/22 1056  Temp    Pulse 87 03/11/22 1059  Resp 18 03/11/22 1059  SpO2 98 % 03/11/22 1059  Vitals shown include unvalidated device data.  Last Pain:  Vitals:   03/11/22 0657  TempSrc:   PainSc: 0-No pain      Patients Stated Pain Goal: 0 (04/79/98 7215)  Complications: No notable events documented.

## 2022-03-11 NOTE — Interval H&P Note (Signed)
History and Physical Interval Note:  03/11/2022 7:45 AM  Susan Holder  has presented today for surgery, with the diagnosis of pleural effusion, pulmonary infiltrates, stage 4 lung cancer.  The various methods of treatment have been discussed with the patient and family. After consideration of risks, benefits and other options for treatment, the patient has consented to  Procedure(s) with comments: ROBOTIC ASSISTED NAVIGATIONAL BRONCHOSCOPY (Left) INSERTION PLEURAL DRAINAGE CATHETER (Right) - indwelling pleural catheter, w/ cuff as a surgical intervention.  The patient's history has been reviewed, patient examined, no change in status, stable for surgery.  I have reviewed the patient's chart and labs.  Questions were answered to the patient's satisfaction.     Maryjane Hurter

## 2022-03-11 NOTE — Op Note (Addendum)
PleurX Insertion Procedure Note  SHANEN NORRIS  [id number]  [birth year]  Date:03/11/22  Time:10:43 AM   Provider Performing:Haidar Muse M Verlee Monte  Procedure: PleurX Tunneled Pleural Catheter Placement (51884)  Indication(s) Relief of dyspnea from recurrent effusion  Consent Risks of the procedure as well as the alternatives and risks of each were explained to the patient and/or caregiver.  Consent for the procedure was obtained.   Anesthesia General anesthesia  Time Out Verified patient identification, verified procedure, site/side was marked, verified correct patient position, special equipment/implants available, medications/allergies/relevant history reviewed, required imaging and test results available.   Sterile Technique Maximal sterile technique including sterile barrier drape, hand hygiene, sterile gown, sterile gloves, mask, hair covering.    Procedure Description Ultrasound used to identify appropriate pleural anatomy for placement and overlying skin marked.  Area of drainage cleaned and draped in sterile fashion.   Lidocaine was used to anesthetize the skin and subcutaneous tissue.   1.5 cm incision made overlying fluid and another about 5 cm anterior to this along chest wall.  Right sided PleurX catheter inserted in usual sterile fashion using modified seldinger technique.  Interrupted silk sutures placed at catheter insertion and tunneling points which will be removed at later date. After fluid aspirated, pleurX capped and sterile dressing applied.  Drained 500cc in PACU with PleurX bottle at which point developed chest tightness, stopped drainage in setting of her entrapped/trapped lung physiology.      Complications/Tolerance None; patient tolerated the procedure well. Chest X-ray is ordered to confirm no post-procedural complication.   EBL Minimal   Specimen(s) none

## 2022-03-11 NOTE — Anesthesia Procedure Notes (Signed)
Procedure Name: Intubation Date/Time: 03/11/2022 9:40 AM  Performed by: Wilburn Cornelia, CRNAPre-anesthesia Checklist: Patient identified, Emergency Drugs available, Suction available, Patient being monitored and Timeout performed Patient Re-evaluated:Patient Re-evaluated prior to induction Oxygen Delivery Method: Circle system utilized Preoxygenation: Pre-oxygenation with 100% oxygen Induction Type: IV induction Ventilation: Mask ventilation without difficulty Laryngoscope Size: Mac and 3 Grade View: Grade III Tube type: Oral Tube size: 8.5 mm Number of attempts: 1 Airway Equipment and Method: Stylet Placement Confirmation: ETT inserted through vocal cords under direct vision, positive ETCO2, CO2 detector and breath sounds checked- equal and bilateral Secured at: 21 cm Tube secured with: Tape Dental Injury: Teeth and Oropharynx as per pre-operative assessment

## 2022-03-11 NOTE — Op Note (Signed)
Bronchoscopy Procedure Note  Susan Holder  [id number]  [birth year]  Date:03/11/22  Time:10:45 AM   Provider Performing:Ivery Nanney M Verlee Monte   Procedure(s):  Flexible Bronchoscopy [phone]), Flexible bronchoscopy with bronchial alveolar lavage [phone]), and Transbronchial lung biopsy, single lobe (81856)  Indication(s) Pneumonitis  Consent Risks of the procedure as well as the alternatives and risks of each were explained to the patient and/or caregiver.  Consent for the procedure was obtained and is signed in the bedside chart  Anesthesia General   Time Out Verified patient identification, verified procedure, site/side was marked, verified correct patient position, special equipment/implants available, medications/allergies/relevant history reviewed, required imaging and test results available.   Sterile Technique Usual hand hygiene, masks, gowns, and gloves were used   Procedure Description Bronchoscope advanced through endotracheal tube and into airway.  Airways were examined down to subsegmental level with findings noted below.   Following diagnostic evaluation, BAL(s) performed in LUL with normal saline and return of 35cc cloudy fluid. TBLB x10 performed in LUL under fluoro. Hemostasis confirmed, scope removed.  Findings:  - Tortuous RLL airway anatomy likely related to radiation exposure - BAL in LUL sent for micro, cyto - TBLB in LUL sent for cyto   Complications/Tolerance None; patient tolerated the procedure well. Chest X-ray is needed post procedure.   EBL Minimal   Specimen(s) BAL for micro, cyto TBLB for cyto

## 2022-03-11 NOTE — Discharge Instructions (Signed)
PleurX Home Management If draining >363ml, drain daily - use rolling stopcock to control speed of drainage - slow it down or stop drainage if you develop chest tightness.  If draining 200-300 ml, drain every other day If draining 100-200 ml, drain every three days When you drain 50 ml three consecutive days in a row, notify us so we can check a chest x-ray and possibly remove the catheter. Please keep a log and bring it with you to your clinic appointment.

## 2022-03-12 ENCOUNTER — Telehealth: Payer: Self-pay | Admitting: Acute Care

## 2022-03-12 ENCOUNTER — Telehealth: Payer: Self-pay

## 2022-03-12 ENCOUNTER — Telehealth: Payer: Self-pay | Admitting: Internal Medicine

## 2022-03-12 NOTE — Telephone Encounter (Signed)
Rescheduled 11/30 appointment to 11/22 due to provider pal, patient has been called and notified.

## 2022-03-12 NOTE — Telephone Encounter (Signed)
Patient would like the nurse to call her regarding some questions she has about her stitches from her surgery.  She has an upcoming appt. With NP and wanted to know if the NP would be able to remove her stiches and also wants to know should they be removed before she uses the pump that she has.  Please call patient to discuss at [phone]

## 2022-03-12 NOTE — Anesthesia Postprocedure Evaluation (Signed)
Anesthesia Post Note  Patient: Susan Holder  Procedure(s) Performed: VIDEO BRONCHOSCOPY WITH FLUORO (Left) INSERTION PLEURAL DRAINAGE CATHETER (Right) BRONCHIAL WASHINGS BRONCHIAL BIOPSIES     Patient location during evaluation: PACU Anesthesia Type: General Level of consciousness: awake and alert Pain management: pain level controlled Vital Signs Assessment: post-procedure vital signs reviewed and stable Respiratory status: spontaneous breathing, nonlabored ventilation, respiratory function stable and patient connected to nasal cannula oxygen Cardiovascular status: blood pressure returned to baseline and stable Postop Assessment: no apparent nausea or vomiting Anesthetic complications: no   No notable events documented.  Last Vitals:  Vitals:   03/11/22 1130 03/11/22 1145  BP: 111/60 109/68  Pulse: 98 95  Resp: 17 10  Temp:  36.4 C  SpO2: 95% 92%    Last Pain:  Vitals:   03/11/22 1145  TempSrc:   PainSc: [place] Lora Glomski

## 2022-03-12 NOTE — Telephone Encounter (Signed)
Transition Care Management Follow-up Telephone Call Date of discharge and from where: 03/11/22 [facility]. How have you been since you were released from the hospital? I'm still in a little pain, but I am doing ok. Any questions or concerns? No  Items Reviewed: Did the pt receive and understand the discharge instructions provided? Yes  Medications obtained and verified? No  Other? No  Any new allergies since your discharge? No  Dietary orders reviewed? No Do you have support at home? Yes   Home Care and Equipment/Supplies: Were home health services ordered? not applicable If so, what is the name of the agency? N/a  Has the agency set up a time to come to the patient's home? not applicable Were any new equipment or medical supplies ordered?  No What is the name of the medical supply agency? N/a Were you able to get the supplies/equipment? not applicable Do you have any questions related to the use of the equipment or supplies? No  Functional Questionnaire: (I = Independent and D = Dependent) ADLs: I  Bathing/Dressing- I  Meal Prep- I  Eating- I  Maintaining continence- I  Transferring/Ambulation- I  Managing Meds- I  Follow up appointments reviewed:  PCP Hospital f/u appt confirmed? No  Scheduled to see n/a on n/a @ n/a. Pt states she has numerous appts coming up. She is not interested in scheduling an appt at this time. Pt instructed to call the office is she needs anything or if she needs an appt, even a Same Day Appt. [facility] f/u appt confirmed? Yes  Scheduled to see Dr. Cardell Peach on 03/18/22 @ 11:00am. Are transportation arrangements needed? No  If their condition worsens, is the pt aware to call PCP or go to the Emergency Dept.? Yes Was the patient provided with contact information for the PCP's office or ED? Yes Was to pt encouraged to call back with questions or concerns? Yes

## 2022-03-13 LAB — CULTURE, BAL-QUANTITATIVE W GRAM STAIN
Culture: NO GROWTH
Gram Stain: NONE SEEN

## 2022-03-13 NOTE — Telephone Encounter (Signed)
Called and discussed - ok to drain with sutures in place. I urged caution with draining much more than 300cc at a time and to use stopcock and let chest tightness be her guide to stop drainage. Eric Form will remove sutures on 11/21 clinic visit.

## 2022-03-13 NOTE — Telephone Encounter (Signed)
Called and spoke with pt who states she recently had a pleurx cath placed 11/14. Pt wanted to know if we thought the sutures could be removed at upcoming appt 11/21 and I stated to her that Judson Roch could look at the site to see if the sutures were ready to come out and she verbalized understanding.  While speaking with pt, she wanted to know if it would be okay to empty the pleurx drain with the sutures still being in place or if she should wait until the sutures came out before emptying it. She stated the last time Dr. Kipp Brood put one in, she was told to wait until the sutures were removed before messing with the drain but she just wasn't sure if this was still the same case. Pt said that there is fluid that is collecting in the drain.  Dr. Verlee Monte, please advise on this for pt.

## 2022-03-14 ENCOUNTER — Encounter (HOSPITAL_COMMUNITY): Payer: Self-pay | Admitting: Student

## 2022-03-14 LAB — CYTOLOGY - NON PAP

## 2022-03-16 LAB — AEROBIC/ANAEROBIC CULTURE W GRAM STAIN (SURGICAL/DEEP WOUND)
Culture: NO GROWTH
Gram Stain: NONE SEEN

## 2022-03-18 ENCOUNTER — Ambulatory Visit: Payer: Medicare PPO | Admitting: Acute Care

## 2022-03-18 ENCOUNTER — Encounter: Payer: Self-pay | Admitting: Acute Care

## 2022-03-18 VITALS — BP 120/62 | HR 114 | Temp 98.2°F | Ht 60.0 in | Wt 129.6 lb

## 2022-03-18 DIAGNOSIS — J9611 Chronic respiratory failure with hypoxia: Secondary | ICD-10-CM | POA: Diagnosis not present

## 2022-03-18 DIAGNOSIS — Z9889 Other specified postprocedural states: Secondary | ICD-10-CM | POA: Diagnosis not present

## 2022-03-18 DIAGNOSIS — R911 Solitary pulmonary nodule: Secondary | ICD-10-CM

## 2022-03-18 DIAGNOSIS — C3491 Malignant neoplasm of unspecified part of right bronchus or lung: Secondary | ICD-10-CM

## 2022-03-18 DIAGNOSIS — Z4802 Encounter for removal of sutures: Secondary | ICD-10-CM | POA: Diagnosis not present

## 2022-03-18 NOTE — Patient Instructions (Addendum)
It is good to see you today. Your biopsies of the left upper lobe were negative for malignancy.  This is great news.   We have removed your sutures to the Pleurex tube.  You can put neosporin on the suture site if needed . Let us know if it gets red  or drains any fluid.  Remember , do not drain > 300'cc at a time. Stop draining for extreme pain We will place an order for your DME to qualify you for an inogen, then if you qualify, we can order it from a supplier who has them available Follow up with Dr. Earlie Server 11/22 as is scheduled.  Call for any issues with your Pleurex tube.  Follow up in 1 month to make sure you are continuing to do well with your pleurex tube.  Please contact office for sooner follow up if symptoms do not improve or worsen or seek emergency care

## 2022-03-18 NOTE — Progress Notes (Unsigned)
History of Present Illness Susan Holder is a 66 y.o. female former smoker , quit 2019 with a 45 pack year smoking history with stage IV lung cancer, brain metastasis, pericardial effusion status post pericardial window, history of rheumatoid arthritis RF, ANA and anti-CCP positive.  Patient currently undergoing therapy with Dr. Earlie Server from medical oncology.  She had a Flexible bronchoscopy with bronchial alveolar lavage  and Transbronchial lung biopsy, single lobe . She is here for follow up after the procedure.    03/18/2022 Pt. Presents for follow up after Pleurex placement and bronch with biopsies No sore throat , no hemoptysis She drained 11/19.  She has her cousin helping her drain. They drain every night to every other night.  She does have some pain with draining.  She plans to drain tonight .  She knows not to drain > 300 cc's at a time, and to stop draining of she has extreme chest pain    Test Results:     Latest Ref Rng & Units 02/12/2022   10:01 AM 11/11/2021   12:26 PM 10/30/2021    2:57 PM  CBC  WBC 4.0 - 10.5 K/uL 6.7  6.7    Hemoglobin 12.0 - 15.0 g/dL 10.3  10.0  11.9   Hematocrit 36.0 - 46.0 % 32.8  32.1  35.0   Platelets 150 - 400 K/uL 228  264         Latest Ref Rng & Units 02/12/2022   10:01 AM 11/11/2021   12:26 PM 10/30/2021    2:57 PM  BMP  Glucose 70 - 99 mg/dL 82  95  103   BUN 8 - 23 mg/dL _0 Creatinine 0.44 - 1.00 mg/dL 1.22  1.19  1.30   Sodium 135 - 145 mmol/L 138  138  136   Potassium 3.5 - 5.1 mmol/L 4.6  4.1  4.0   Chloride 98 - 111 mmol/L 102  100  103   CO2 22 - 32 mmol/L 31  35    Calcium 8.9 - 10.3 mg/dL 10.9  11.2      BNP No results found for: "BNP"  ProBNP No results found for: "PROBNP"  PFT    Component Value Date/Time   FEV1PRE 1.47 03/08/2018 1101   FEV1POST 1.42 03/08/2018 1101   FVCPRE 2.00 03/08/2018 1101   FVCPOST 1.91 03/08/2018 1101   TLC 2.74 03/08/2018 1101   PREFEV1FVCRT 73 03/08/2018 1101    PSTFEV1FVCRT 74 03/08/2018 1101    DG Chest Port 1 View  Result Date: 03/11/2022 CLINICAL DATA:  CHF, post biopsy in right lung EXAM: PORTABLE CHEST 1 VIEW COMPARISON:  Previous studies including the chest radiograph done on 10/18/2021 and CT chest done on 02/12/2022 FINDINGS: There is interval placement of right chest tube. There is moderate to large residual right pleural effusion. There is no pneumothorax. Increased markings in left upper and lower lung fields may suggest scarring and possibly superimposed interstitial pneumonia. IMPRESSION: Interval placement of right chest tube. Moderate to large right pleural effusion. There is no pneumothorax. Increased markings in left lung may suggest scarring and possibly interstitial pneumonia. Electronically Signed   By: Elmer Picker M.D.   On: 03/11/2022 12:11   DG C-ARM BRONCHOSCOPY  Result Date: 03/11/2022 C-ARM BRONCHOSCOPY: Fluoroscopy was utilized by the requesting physician.  No radiographic interpretation.   MR Brain W Wo Contrast  Result Date: 02/26/2022 CLINICAL DATA:  Brain metastases, assess treatment response 3T SRS Protocol  EXAM: MRI HEAD WITHOUT AND WITH CONTRAST TECHNIQUE: Multiplanar, multiecho pulse sequences of the brain and surrounding structures were obtained without and with intravenous contrast. CONTRAST:  2.5 mL of Vueway IV COMPARISON:  MRI head 10/30/2021. FINDINGS: Brain: Differences in technique limits assessment; however, suspected mildly increased size/conspicuity of a 5 mm lesion in the inferior right frontal lobe (series 14, image 57) with mildly increased surrounding FLAIR hyperintensity/edema. Similar versus mildly increased size of an 8 mm lesion in the inferior left cerebellum (series 14, image 28). Similar versus mildly increased size of a 5 mm lesion in the lateral left cerebellum (series 14, image 33). Similar 2 mm lesion in the medial right cerebellum. No new lesions identified. No acute infarction,  hemorrhage, hydrocephalus, or extra-axial fluid collection. Mild to moderate scattered T2/FLAIR hyperintensities in the white matter, nonspecific but compatible with chronic microvascular ischemic disease. Vascular: Major arterial flow voids are maintained at the skull base. Skull and upper cervical spine: Normal marrow signal. Sinuses/Orbits: Clear sinuses.  No acute orbital findings. Other: Small left mastoid effusion. IMPRESSION: 1. Differences in technique limit direct comparison; however, suspect mildly increased size of a 5 mm lesion inferior right frontal lobe (detailed above) with mildly increased surrounding edema. 2. Additional lesions appear similar versus slightly increased, detailed above. Electronically Signed   By: Margaretha Sheffield M.D.   On: 02/26/2022 13:46     Past medical hx Past Medical History:  Diagnosis Date   Acid reflux 09/21/2018   Adenocarcinoma of right lung, stage 4 ([place]) 11/17/2017   ADHD (attention deficit hyperactivity disorder)    patient denies this dx as of 03/10/22   Allergic rhinitis 08/22/2013   ANA positive 05/29/2016   Anemia    Aortic atherosclerosis ([place]) 10/23/2020   Brain metastases 03/29/2019   COPD (chronic obstructive pulmonary disease) ([place])    Coronary artery calcification seen on CT scan 10/23/2020   patient is unaware of this dx as of 03/10/22   DDD (degenerative disc disease), cervical 05/29/2016   patient denies this dx as of 03/10/22   Dyspnea    Encounter for antineoplastic chemotherapy 11/17/2017   Encounter for antineoplastic immunotherapy 02/01/2018   Goals of care, counseling/discussion 11/17/2017   High risk medication use 05/29/2016   04/29/2016: ==> plq 200 am & 100 qhs(adeq response).   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   Other fatigue 05/29/2016   Pericardial effusion 10/23/2020   Pneumonia    as a child   Primary malignant neoplasm of bronchus of right lower lobe ([place]) 12/21/2017   Rheumatoid arthritis (HCC)    Rheumatoid arthritis involving multiple sites with positive rheumatoid factor ([place]) 05/29/2016   +RF +ANA +CCP    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   Smoker 05/29/2016   quit 2019   Trigger finger, left ring finger 05/29/2016   Trigger finger, right ring finger 05/29/2016   Vitamin D deficiency 05/29/2016     Social History   Tobacco Use   Smoking status: Former    Packs/day: 0.50    Years: 45.00    Total pack years: 22.50    Types: Cigarettes    Quit date: 09/29/2017    Years since quitting: 4.4   Smokeless tobacco: Never  Vaping Use   Vaping Use: Never used  Substance Use Topics   Alcohol use: Not Currently   Drug use: No    Ms.Stuhr reports that she quit smoking about 4 years ago. Her smoking use included cigarettes. She has a 22.50 pack-year smoking history. She has never used smokeless tobacco. She reports that she does not currently use alcohol. She reports that she does not use drugs.  Tobacco Cessation: Counseling given: Not Answered   Past surgical hx, Family hx, Social hx all reviewed.  Current Outpatient Medications on File Prior to Visit  Medication Sig   acetaminophen (TYLENOL) 325 MG tablet Take 2 tablets (650 mg total) by mouth every 6 (six) hours. (Patient taking differently: Take 650 mg by mouth every 6 (six) hours as needed for moderate pain.)   Ascorbic Acid (VITAMIN C) 250 MG CHEW Chew 500 mg by mouth daily. Gummy   Cholecalciferol (VITAMIN D-3) 125 MCG (5000 UT) TABS Take 5,000 Units by mouth daily.   fluticasone (FLONASE) 50 MCG/ACT nasal spray Place 2 sprays into both nostrils daily. (Patient taking differently: Place 2 sprays into both nostrils daily as needed for allergies or rhinitis.)   folic acid (FOLVITE) 1 MG  tablet Take 1 tablet (1 mg total) by mouth daily.   hydroxychloroquine (PLAQUENIL) 200 MG tablet TAKE 1 TABLET BY MOUTH TWICE DAILY MONDAY- FRIDAY ONLY   omeprazole (PRILOSEC) 20 MG capsule Take 20 mg by mouth daily.   prochlorperazine (COMPAZINE) 10 MG tablet TAKE 1 TABLET(10 MG) BY MOUTH EVERY 6 HOURS AS NEEDED FOR NAUSEA OR VOMITING (Patient not taking: Reported on 03/06/2022)   UNABLE TO FIND Med Name  Amoclav  25 mg   twice a day for infection (ulcer) in mouth   No current facility-administered medications on file prior to visit.     No Known Allergies  Review Of Systems:  Constitutional:   No  weight loss, night sweats,  Fevers, chills, fatigue, or  lassitude.  HEENT:   No headaches,  Difficulty swallowing,  Tooth/dental problems, or  Sore throat,                No sneezing, itching, ear ache, nasal congestion, post nasal drip,   CV:  No chest pain,  Orthopnea, PND, swelling in lower extremities, anasarca, dizziness, palpitations, syncope.   GI  No heartburn, indigestion, abdominal pain, nausea, vomiting, diarrhea, change in bowel habits, loss of appetite, bloody stools.   Resp: No shortness of breath with exertion or at rest.  No excess mucus, no productive cough,  No non-productive cough,  No coughing up of blood.  No change in color of mucus.  No wheezing.  No chest wall deformity  Skin: no rash or lesions.  GU: no dysuria, change in color of urine, no urgency or frequency.  No flank pain, no hematuria   MS:  No joint pain or swelling.  No decreased range of motion.  No back pain.  Psych:  No change in mood or affect. No depression or anxiety.  No memory loss.   Vital Signs LMP 04/28/2000    Physical Exam:  General- No distress,  A&Ox3 ENT: No sinus tenderness, TM clear, pale nasal mucosa, no oral exudate,no post nasal drip, no LAN Cardiac: S1, S2, regular rate and rhythm, no murmur Chest: No wheeze/ rales/ dullness; no accessory muscle use, no nasal flaring, no  sternal retractions Abd.: Soft Non-tender Ext: No clubbing cyanosis, edema Neuro:  normal strength Skin: No rashes, warm and dry Psych:   normal mood and behavior   Assessment/Plan  No problem-specific Assessment & Plan notes found for this encounter.    Magdalen Spatz, NP 03/18/2022  10:58 AM

## 2022-03-19 ENCOUNTER — Encounter: Payer: Self-pay | Admitting: Acute Care

## 2022-03-19 ENCOUNTER — Inpatient Hospital Stay: Payer: Medicare PPO | Admitting: Internal Medicine

## 2022-03-19 ENCOUNTER — Inpatient Hospital Stay: Payer: Medicare PPO

## 2022-03-19 ENCOUNTER — Other Ambulatory Visit: Payer: Self-pay

## 2022-03-19 ENCOUNTER — Encounter: Payer: Self-pay | Admitting: Internal Medicine

## 2022-03-19 VITALS — BP 144/72 | HR 109 | Temp 97.9°F | Resp 16 | Wt 129.1 lb

## 2022-03-19 DIAGNOSIS — C3491 Malignant neoplasm of unspecified part of right bronchus or lung: Secondary | ICD-10-CM

## 2022-03-19 DIAGNOSIS — Z87891 Personal history of nicotine dependence: Secondary | ICD-10-CM | POA: Diagnosis not present

## 2022-03-19 DIAGNOSIS — C3431 Malignant neoplasm of lower lobe, right bronchus or lung: Secondary | ICD-10-CM | POA: Diagnosis not present

## 2022-03-19 DIAGNOSIS — C7931 Secondary malignant neoplasm of brain: Secondary | ICD-10-CM | POA: Diagnosis not present

## 2022-03-19 DIAGNOSIS — C3490 Malignant neoplasm of unspecified part of unspecified bronchus or lung: Secondary | ICD-10-CM | POA: Diagnosis not present

## 2022-03-19 DIAGNOSIS — Z79899 Other long term (current) drug therapy: Secondary | ICD-10-CM | POA: Diagnosis not present

## 2022-03-19 DIAGNOSIS — Z923 Personal history of irradiation: Secondary | ICD-10-CM | POA: Diagnosis not present

## 2022-03-19 DIAGNOSIS — Z9221 Personal history of antineoplastic chemotherapy: Secondary | ICD-10-CM | POA: Diagnosis not present

## 2022-03-19 LAB — CMP (CANCER CENTER ONLY)
ALT: 10 U/L (ref 0–44)
AST: 16 U/L (ref 15–41)
Albumin: 3.6 g/dL (ref 3.5–5.0)
Alkaline Phosphatase: 90 U/L (ref 38–126)
Anion gap: 3 — ABNORMAL LOW (ref 5–15)
BUN: 21 mg/dL (ref 8–23)
CO2: 32 mmol/L (ref 22–32)
Calcium: 10.6 mg/dL — ABNORMAL HIGH (ref 8.9–10.3)
Chloride: 105 mmol/L (ref 98–111)
Creatinine: 1.13 mg/dL — ABNORMAL HIGH (ref 0.44–1.00)
GFR, Estimated: 53 mL/min — ABNORMAL LOW (ref >60)
Glucose, Bld: 87 mg/dL (ref 70–99)
Potassium: 4.4 mmol/L (ref 3.5–5.1)
Sodium: 140 mmol/L (ref 135–145)
Total Bilirubin: 0.3 mg/dL (ref 0.3–1.2)
Total Protein: 7 g/dL (ref 6.5–8.1)

## 2022-03-19 LAB — CBC WITH DIFFERENTIAL (CANCER CENTER ONLY)
Abs Immature Granulocytes: 0.02 10*3/uL (ref 0.00–0.07)
Basophils Absolute: 0 10*3/uL (ref 0.0–0.1)
Basophils Relative: 1 %
Eosinophils Absolute: 0.1 10*3/uL (ref 0.0–0.5)
Eosinophils Relative: 2 %
HCT: 33.4 % — ABNORMAL LOW (ref 36.0–46.0)
Hemoglobin: 10.1 g/dL — ABNORMAL LOW (ref 12.0–15.0)
Immature Granulocytes: 0 %
Lymphocytes Relative: 14 %
Lymphs Abs: 0.9 10*3/uL (ref 0.7–4.0)
MCH: 29.8 pg (ref 26.0–34.0)
MCHC: 30.2 g/dL (ref 30.0–36.0)
MCV: 98.5 fL (ref 80.0–100.0)
Monocytes Absolute: 0.7 10*3/uL (ref 0.1–1.0)
Monocytes Relative: 10 %
Neutro Abs: 4.9 10*3/uL (ref 1.7–7.7)
Neutrophils Relative %: 73 %
Platelet Count: 259 10*3/uL (ref 150–400)
RBC: 3.39 MIL/uL — ABNORMAL LOW (ref 3.87–5.11)
RDW: 13.5 % (ref 11.5–15.5)
WBC Count: 6.7 10*3/uL (ref 4.0–10.5)
nRBC: 0 % (ref 0.0–0.2)

## 2022-03-19 LAB — TSH: TSH: 1.58 u[IU]/mL (ref 0.350–4.500)

## 2022-03-19 NOTE — Progress Notes (Signed)
[name] [phone]   Fax:([phone]) [phone]  OFFICE PROGRESS NOTE  Bonnita [place], MD [place] [address]  DIAGNOSIS: Metastatic non-small cell lung cancer initially diagnosed as stage IIIA (T3, N1/N2, M0) non-small cell lung cancer, adenocarcinoma presented with large right lower lobe lung mass with extension to the right hilum and subcarinal area diagnosed in July 2019.  She has brain metastasis in October 2020.   Biomarker Findings Tumor Mutational Burden - TMB-Intermediate (6 Muts/Mb) Microsatellite status - MS-Stable Genomic Findings For a complete list of the genes assayed, please refer to the Appendix. NRAS Q61R ARAF amplification STK11 G56W KRAS G13D MYCN amplification MCL1 amplification NKX2-1 amplification - equivocal? TP53 G245V 7 Disease relevant genes with no reportable alterations: EGFR, ALK, BRAF, MET, ERBB2, RET, ROS1    PRIOR THERAPY: 1) Course of concurrent chemoradiation with weekly carboplatin for AUC of 2 and paclitaxel 45 mg/M2.  Status post 7 cycles.  Last dose was giving 01/11/2018. 2) Consolidation treatment with immunotherapy with Imfinzi (Durvalumab) 10 mg/KG every 2 weeks.  First dose February 09, 2018.  Status post 19 cycles. 3) status post stereotactic body radiotherapy to the enlarging right supraclavicular lymphadenopathy under the care of Dr. Lisbeth Renshaw. 4) SRS to multiple brain metastasis under the care of Dr. Lisbeth Renshaw. 5) Systemic chemotherapy with carboplatin for AUC of 5, Alimta 500 mg/M2 and Keytruda 200 mg IV every 3 weeks.  First dose 08/16/2019.  Status post 35 cycles.  Starting from cycle #5 the patient is on maintenance treatment with Alimta and Keytruda every 3 weeks. Alimta was reduced to 400 mg/m2 starting from cycle #21.  Starting from cycle #30 her treatment was changed to single agent Keytruda every 3 weeks.  Alimta was discontinued secondary to toxicity.   CURRENT THERAPY:  Observation.   INTERVAL HISTORY: Susan Holder 66 y.o. female returns to the clinic today for follow-up visit.  The patient is feeling fine today with no concerning complaints except for the baseline shortness of breath and she is currently on home oxygen.  She denied having any current chest pain, cough or hemoptysis.  She has no nausea, vomiting, diarrhea or constipation.  She has no headache or visual changes.  She denied having any recent weight loss or night sweats.  She was found on previous CT scan of the chest in October 2023 to have evolving changes throughout the left lung with possibility of lymphangitic spread of disease versus inflammatory process.  The patient also had enlarging chronic right pleural effusion.  She was referred to Dr. Valeta Harms and she had bronchoscopy as well as drainage of the fluid by Dr. Bjorn Loser on March 11, 2022.  The final pathology from the fluid and the bronchial lavage and brushing were negative for malignancy.  She is here today for evaluation and recommendation regarding her condition.    MEDICAL HISTORY: Past Medical History:  Diagnosis Date   Acid reflux 09/21/2018   Adenocarcinoma of right lung, stage 4 ([place]) 11/17/2017   ADHD (attention deficit hyperactivity disorder)    patient denies this dx as of 03/10/22   Allergic rhinitis 08/22/2013   ANA positive 05/29/2016   Anemia    Aortic atherosclerosis ([place]) 10/23/2020   Brain metastases 03/29/2019   COPD (chronic obstructive pulmonary disease) ([place])    Coronary artery calcification seen on CT scan 10/23/2020   patient is unaware of this dx as of 03/10/22   DDD (degenerative disc disease), cervical 05/29/2016  patient denies this dx as of 03/10/22   Dyspnea    Encounter for antineoplastic chemotherapy 11/17/2017   Encounter for antineoplastic immunotherapy 02/01/2018   Goals of care, counseling/discussion 11/17/2017   High risk medication use 05/29/2016   04/29/2016: ==> plq 200 am & 100  qhs(adeq response).   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[place]) 11/04/2018   Other fatigue 05/29/2016   Pericardial effusion 10/23/2020   Pneumonia    as a child   Primary malignant neoplasm of bronchus of right lower lobe ([place]) 12/21/2017   Rheumatoid arthritis ([place])    Rheumatoid arthritis involving multiple sites with positive rheumatoid factor ([place]) 05/29/2016   +RF +ANA +CCP    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   Smoker 05/29/2016   quit 2019   Trigger finger, left ring finger 05/29/2016   Trigger finger, right ring finger 05/29/2016   Vitamin D deficiency 05/29/2016    ALLERGIES:  has No Known Allergies.  MEDICATIONS:  Current Outpatient Medications  Medication Sig Dispense Refill   acetaminophen (TYLENOL) 325 MG tablet Take 2 tablets (650 mg total) by mouth every 6 (six) hours. (Patient taking differently: Take 650 mg by mouth every 6 (six) hours as needed for moderate pain.)     Ascorbic Acid (VITAMIN C) 250 MG CHEW Chew 500 mg by mouth daily. Gummy     Cholecalciferol (VITAMIN D-3) 125 MCG (5000 UT) TABS Take 5,000 Units by mouth daily.     fluticasone (FLONASE) 50 MCG/ACT nasal spray Place 2 sprays into both nostrils daily. (Patient taking differently: Place 2 sprays into both nostrils daily as needed for allergies or rhinitis.) 16 g 6   folic acid (FOLVITE) 1 MG tablet Take 1 tablet (1 mg total) by mouth daily. 90 tablet 0   hydroxychloroquine (PLAQUENIL) 200 MG tablet TAKE 1 TABLET BY MOUTH TWICE DAILY MONDAY- FRIDAY ONLY 120 tablet 0   omeprazole (PRILOSEC) 20 MG capsule Take 20 mg by mouth daily.     prochlorperazine (COMPAZINE) 10 MG tablet TAKE 1 TABLET(10 MG) BY MOUTH EVERY 6 HOURS AS NEEDED FOR NAUSEA OR VOMITING 30 tablet 2   UNABLE TO FIND Med Name   Amoclav  25 mg   twice a day for infection (ulcer) in mouth     No current facility-administered medications for this visit.    SURGICAL HISTORY:  Past Surgical History:  Procedure Laterality Date   BRONCHIAL BIOPSY  03/11/2022   Procedure: BRONCHIAL BIOPSIES;  Surgeon: Maryjane Hurter, MD;  Location: [facility];  Service: Pulmonary;;   BRONCHIAL NEEDLE ASPIRATION BIOPSY  11/09/2017   Procedure: BRONCHIAL NEEDLE ASPIRATION BIOPSIES;  Surgeon: Marshell Garfinkel, MD;  Location: [facility];  Service: Cardiopulmonary;;   BRONCHIAL WASHINGS  03/11/2022   Procedure: BRONCHIAL WASHINGS;  Surgeon: Maryjane Hurter, MD;  Location: [facility];  Service: Pulmonary;;   CHEST TUBE INSERTION Left 05/13/2021   Procedure: INSERTION PLEURAL DRAINAGE CATHETER;  Surgeon: Lajuana Matte, MD;  Location: [facility];  Service: Thoracic;  Laterality: Left;   CHEST TUBE INSERTION Right 03/11/2022   Procedure: INSERTION PLEURAL DRAINAGE CATHETER;  Surgeon: Maryjane Hurter, MD;  Location: [facility];  Service: Pulmonary;  Laterality: Right;  indwelling pleural catheter, w/ cuff   ENDOBRONCHIAL  ULTRASOUND Bilateral 11/09/2017   Procedure: ENDOBRONCHIAL ULTRASOUND;  Surgeon: Marshell Garfinkel, MD;  Location: [facility];  Service: Cardiopulmonary;  Laterality: Bilateral;   NASAL SINUS SURGERY     NASAL SINUS SURGERY     THORACENTESIS Right 02/07/2021   Procedure: THORACENTESIS;  Surgeon: Lajuana Matte, MD;  Location: [facility];  Service: Thoracic;  Laterality: Right;   TUBAL LIGATION     VIDEO BRONCHOSCOPY  11/09/2017   Procedure: VIDEO BRONCHOSCOPY;  Surgeon: Marshell Garfinkel, MD;  Location: [facility];  Service: Cardiopulmonary;;   VIDEO BRONCHOSCOPY Left 03/11/2022   Procedure: VIDEO BRONCHOSCOPY WITH FLUORO;  Surgeon: Maryjane Hurter, MD;  Location: [facility];  Service: Pulmonary;  Laterality: Left;   XI ROBOTIC ASSISTED PERICARDIAL WINDOW Left 02/07/2021   Procedure: XI ROBOTIC ASSISTED  THORACOSCOPY PERICARDIAL WINDOW;  Surgeon: Lajuana Matte, MD;  Location: [facility];  Service: Thoracic;  Laterality: Left;    REVIEW OF SYSTEMS:  A comprehensive review of systems was negative except for: Constitutional: positive for fatigue Respiratory: positive for dyspnea on exertion   PHYSICAL EXAMINATION: General appearance: alert, cooperative, fatigued, and no distress Head: Normocephalic, without obvious abnormality, atraumatic Neck: no adenopathy, no JVD, supple, symmetrical, trachea midline, and thyroid not enlarged, symmetric, no tenderness/mass/nodules Lymph nodes: Cervical, supraclavicular, and axillary nodes normal. Resp: diminished breath sounds RLL and dullness to percussion RLL Back: symmetric, no curvature. ROM normal. No CVA tenderness. Cardio: regular rate and rhythm, S1, S2 normal, no murmur, click, rub or gallop GI: soft, non-tender; bowel sounds normal; no masses,  no organomegaly Extremities: extremities normal, atraumatic, no cyanosis or edema  ECOG PERFORMANCE STATUS: 1 - Symptomatic but completely ambulatory  Blood pressure (!) 144/72, pulse (!) 109, temperature 97.9 F (36.6 C), temperature source Oral, resp. rate 16, weight 129 lb 1.6 oz (58.6 kg), last menstrual period 04/28/2000, SpO2 93 %.  LABORATORY DATA: Lab Results  Component Value Date   WBC 6.7 03/19/2022   HGB 10.1 (L) 03/19/2022   HCT 33.4 (L) 03/19/2022   MCV 98.5 03/19/2022   PLT 259 03/19/2022      Chemistry      Component Value Date/Time   NA 138 02/12/2022 1001   K 4.6 02/12/2022 1001   CL 102 02/12/2022 1001   CO2 31 02/12/2022 1001   BUN 19 02/12/2022 1001   CREATININE 1.22 (H) 02/12/2022 1001   CREATININE 0.80 07/24/2017 1438      Component Value Date/Time   CALCIUM 10.9 (H) 02/12/2022 1001   ALKPHOS 91 02/12/2022 1001   AST 18 02/12/2022 1001   ALT 7 02/12/2022 1001   BILITOT 0.2 (L) 02/12/2022 1001       RADIOGRAPHIC STUDIES: DG Chest Port 1 View  Result Date:  03/11/2022 CLINICAL DATA:  CHF, post biopsy in right lung EXAM: PORTABLE CHEST 1 VIEW COMPARISON:  Previous studies including the chest radiograph done on 10/18/2021 and CT chest done on 02/12/2022 FINDINGS: There is interval placement of right chest tube. There is moderate to large residual right pleural effusion. There is no pneumothorax. Increased markings in left upper and lower lung fields may suggest scarring and possibly superimposed interstitial pneumonia. IMPRESSION: Interval placement of right chest tube. Moderate to large right pleural effusion. There is no pneumothorax. Increased markings in left lung may suggest scarring and possibly interstitial pneumonia. Electronically Signed   By: Elmer Picker M.D.   On: 03/11/2022 12:11   DG C-ARM BRONCHOSCOPY  Result Date: 03/11/2022 C-ARM BRONCHOSCOPY: Fluoroscopy was utilized by the requesting physician.  No radiographic interpretation.   MR Brain W Wo Contrast  Result Date: 02/26/2022 CLINICAL DATA:  Brain metastases, assess treatment response 3T SRS Protocol EXAM: MRI HEAD WITHOUT AND WITH CONTRAST TECHNIQUE: Multiplanar, multiecho pulse sequences of the brain and surrounding structures were obtained without and with intravenous contrast. CONTRAST:  2.5 mL of Vueway IV COMPARISON:  MRI head 10/30/2021. FINDINGS: Brain: Differences in technique limits assessment; however, suspected mildly increased size/conspicuity of a 5 mm lesion in the inferior right frontal lobe (series 14, image 57) with mildly increased surrounding FLAIR hyperintensity/edema. Similar versus mildly increased size of an 8 mm lesion in the inferior left cerebellum (series 14, image 28). Similar versus mildly increased size of a 5 mm lesion in the lateral left cerebellum (series 14, image 33). Similar 2 mm lesion in the medial right cerebellum. No new lesions identified. No acute infarction, hemorrhage, hydrocephalus, or extra-axial fluid collection. Mild to moderate  scattered T2/FLAIR hyperintensities in the white matter, nonspecific but compatible with chronic microvascular ischemic disease. Vascular: Major arterial flow voids are maintained at the skull base. Skull and upper cervical spine: Normal marrow signal. Sinuses/Orbits: Clear sinuses.  No acute orbital findings. Other: Small left mastoid effusion. IMPRESSION: 1. Differences in technique limit direct comparison; however, suspect mildly increased size of a 5 mm lesion inferior right frontal lobe (detailed above) with mildly increased surrounding edema. 2. Additional lesions appear similar versus slightly increased, detailed above. Electronically Signed   By: Margaretha Sheffield M.D.   On: 02/26/2022 13:46    ASSESSMENT AND PLAN: This is a very pleasant 66 years old African-American female with metastatic non-small cell lung cancer initially diagnosed with a stage IIIA non-small cell lung cancer, adenocarcinoma.  She underwent a course of concurrent chemoradiation with weekly carboplatin and paclitaxel status post 7 cycles with partial response.   The patient tolerated this course of treatment well except for mild odynophagia and dysphagia. She completed on consolidation treatment with immunotherapy with Imfinzi (Durvalumab) status post 18 cycles. She also completed SBRT to the right supraclavicular lymphadenopathy. The patient had evidence for multiple brain metastasis in October 2020 and she underwent SRS treatment to this lesion under the care of Dr. Lisbeth Renshaw. The patient had evidence for disease progression and she started systemic chemotherapy with carboplatin, Alimta and Keytruda status post 35 cycles.  Starting from cycle #5 she is on maintenance treatment with Alimta and Keytruda every 3 weeks.  Starting from cycle #30 the patient is on treatment with single agent Keytruda 200 Mg IV every 3 weeks.  Alimta was discontinued secondary to intolerance. Her scan of the chest, abdomen and pelvis in October 2023  showed no concerning findings for disease progression but there was evolving changes throughout the left lung suspicious for lymphangitic spread of disease versus inflammatory process from immunotherapy or cryptogenic organizing pneumonia.  She also has persistently moderately large right pleural effusion. The patient was referred to pulmonary medicine and she underwent bronchoscopy in addition to drainage of the right pleural effusion and the final cytology was negative for malignancy. I discussed the results with the patient today.  I recommended for her to continue on observation with repeat CT scan of the chest, abdomen and pelvis in 3 months. She was advised to call immediately if she has any other concerning symptoms in the interval. The patient was advised to call immediately if she has any other concerning symptoms in the interval.  The total time spent in the appointment was 20 minutes.  Disclaimer: This note  was dictated with voice recognition software. Similar sounding words can inadvertently be transcribed and may not be corrected upon review.

## 2022-03-21 ENCOUNTER — Telehealth: Payer: Self-pay | Admitting: Internal Medicine

## 2022-03-21 NOTE — Telephone Encounter (Signed)
Contacted patient to scheduled appointments. Patient is aware of appointments that are scheduled.   

## 2022-03-27 ENCOUNTER — Other Ambulatory Visit: Payer: Self-pay

## 2022-03-27 ENCOUNTER — Other Ambulatory Visit: Payer: Medicare PPO

## 2022-03-27 ENCOUNTER — Ambulatory Visit: Payer: Medicare PPO | Admitting: Internal Medicine

## 2022-04-04 ENCOUNTER — Telehealth: Payer: Self-pay | Admitting: Student

## 2022-04-04 NOTE — Telephone Encounter (Signed)
Called and spoke to [place] and she states they are needing Dr Verlee Monte signature for a procedure and supplies the he did for Dr Valeta Harms.  Sir please advise on this order that you did for Dr Valeta Harms

## 2022-04-08 ENCOUNTER — Telehealth: Payer: Self-pay | Admitting: Pulmonary Disease

## 2022-04-08 DIAGNOSIS — J90 Pleural effusion, not elsewhere classified: Secondary | ICD-10-CM | POA: Diagnosis not present

## 2022-04-08 DIAGNOSIS — C7800 Secondary malignant neoplasm of unspecified lung: Secondary | ICD-10-CM | POA: Diagnosis not present

## 2022-04-08 LAB — FUNGUS CULTURE RESULT

## 2022-04-08 LAB — FUNGUS CULTURE WITH STAIN

## 2022-04-08 LAB — FUNGAL ORGANISM REFLEX

## 2022-04-08 NOTE — Telephone Encounter (Signed)
Called and spoke to [place] at [facility] and she is faxing over order to be signed for supplies for patient. Nothing further needed

## 2022-04-08 NOTE — Telephone Encounter (Deleted)
Pt has one bottle left. Do you know of a place she can go to hold her over with needed supplies?

## 2022-04-08 NOTE — Telephone Encounter (Addendum)
Pt calling again. [employer] has no auth yet for her inserts and ins won't pay for supplies w/o Dr. Madaline Brilliant. Please call PT to advise when to expect call in/auth if you will. [phone] Pt has one bottle left. Do you know of a place she can go to hold her over with needed supplies?

## 2022-04-08 NOTE — Telephone Encounter (Signed)
Called to request a verbal authorization for Pleurx.  Please call to discuss further at [phone]

## 2022-04-08 NOTE — Telephone Encounter (Signed)
Called Susan Holder  They need detailed written order signed for the pleurx supplies  She has refaxed form to upstairs in triage  Will await form  She says that the order is ready and waiting for [employer] to pick up right now in the warehouse  I called and spoke with the pt and notified her of this  I received form and will hold my basket to have Dr Valeta Harms sign when he comes to clinic 04/09/22

## 2022-04-09 NOTE — Telephone Encounter (Signed)
Form given to Dr Valeta Harms to sign

## 2022-04-09 NOTE — Telephone Encounter (Signed)
Form faxed to [facility].

## 2022-05-02 ENCOUNTER — Ambulatory Visit: Payer: Medicare PPO | Admitting: Acute Care

## 2022-05-07 ENCOUNTER — Ambulatory Visit: Payer: Medicare PPO | Admitting: Physician Assistant

## 2022-05-14 ENCOUNTER — Ambulatory Visit: Payer: Medicare PPO | Admitting: Adult Health

## 2022-05-19 DIAGNOSIS — J90 Pleural effusion, not elsewhere classified: Secondary | ICD-10-CM | POA: Diagnosis not present

## 2022-05-19 DIAGNOSIS — C7800 Secondary malignant neoplasm of unspecified lung: Secondary | ICD-10-CM | POA: Diagnosis not present

## 2022-05-27 DIAGNOSIS — C3490 Malignant neoplasm of unspecified part of unspecified bronchus or lung: Secondary | ICD-10-CM | POA: Diagnosis not present

## 2022-06-03 ENCOUNTER — Ambulatory Visit: Payer: Medicare PPO | Admitting: Acute Care

## 2022-06-03 ENCOUNTER — Encounter: Payer: Self-pay | Admitting: Acute Care

## 2022-06-03 VITALS — BP 124/62 | HR 114 | Temp 98.2°F | Ht 60.0 in | Wt 141.8 lb

## 2022-06-03 DIAGNOSIS — J90 Pleural effusion, not elsewhere classified: Secondary | ICD-10-CM

## 2022-06-03 DIAGNOSIS — C3491 Malignant neoplasm of unspecified part of right bronchus or lung: Secondary | ICD-10-CM

## 2022-06-03 NOTE — Progress Notes (Signed)
History of Present Illness Susan Holder is a 67 y.o. female female former smoker , quit 2019 with a 45 pack year smoking history with stage IV  Right lung cancer, brain metastasis, pericardial effusion status post pericardial window, history of rheumatoid arthritis RF, ANA and anti-CCP positive.  Patient currently followed by  Dr. Earlie Server from medical oncology.  She had a Flexible bronchoscopy with bronchial alveolar lavage  and Transbronchial lung biopsy, single lobe , with right sided Pleurex tube  placement in 03/11/2022. Biopsy to of the Left upper lung was negative for a new  primary bronchogenic cancer. She requires every other day drainage of her right sided pleurex catheter. She is followed by Dr. Valeta Harms and Dr. Verlee Monte( placed pleurex) .   06/04/2022 Pt. Presents for follow up. She complains of her right pleurex catheter feeling like there is a stitch that was not removed, and she feels a sharp and uncomfortable censation. She continues to drain every other day, about 300 cc's at a time. Fluid is clear, not cloudy, and the site is clean with no drainage or inflammation. There is a ridge around the insertion site that appears to be granulated tissue, but I am unable to see a suture. Site does have some sharp scabs which were removed with an alcohol swab. I had Tammy Parrett NP also assess the site. She was unable to see anything that looked like a suture, she looked through a magnifier.  Dr. Silas Flood was in the office and was kind enough to come into the exam room  and evaluate the tube also. He did not see a suture, but did agree that there was potential for scar tissue that she may be feeling. He spend about 15-20  minutes evaluating the area.  Pt is in no distress. She has had a previous pleurex tube on her left, and she stated that this tube felt a bit different.  She did state it felt a bit better just removing the scabbing around the site. The site was redressed, and triple antibiotic was  applied. All assessment of the catheter was done with sterile technique.We will have her follow up with Dr. Verlee Monte, who placed the tube , to make sure we did not miss anything. I told her to return if the site developed any redness or drainage, if the fluid became cloudy or discolored, or if she developed a fever. She verbalized understanding.   Test Results: Cytology left upper lobe biopsy 03/11/2022 FINAL MICROSCOPIC DIAGNOSIS:  - No malignant cells identified  - Benign bronchial epithelium and bronchial mucosa  - Single subepithelial multinucleated histiocyte  - Negative for alveolated lung      Latest Ref Rng & Units 03/19/2022    8:31 AM 02/12/2022   10:01 AM 11/11/2021   12:26 PM  CBC  WBC 4.0 - 10.5 K/uL 6.7  6.7  6.7   Hemoglobin 12.0 - 15.0 g/dL 10.1  10.3  10.0   Hematocrit 36.0 - 46.0 % 33.4  32.8  32.1   Platelets 150 - 400 K/uL 259  228  264        Latest Ref Rng & Units 03/19/2022    8:31 AM 02/12/2022   10:01 AM 11/11/2021   12:26 PM  BMP  Glucose 70 - 99 mg/dL 87  82  95   BUN 8 - 23 mg/dL 21  19  21    Creatinine 0.44 - 1.00 mg/dL 1.13  1.22  1.19   Sodium 135 - 145 mmol/L  140  138  138   Potassium 3.5 - 5.1 mmol/L 4.4  4.6  4.1   Chloride 98 - 111 mmol/L 105  102  100   CO2 22 - 32 mmol/L 32  31  35   Calcium 8.9 - 10.3 mg/dL 10.6  10.9  11.2     BNP No results found for: "BNP"  ProBNP No results found for: "PROBNP"  PFT    Component Value Date/Time   FEV1PRE 1.47 03/08/2018 1101   FEV1POST 1.42 03/08/2018 1101   FVCPRE 2.00 03/08/2018 1101   FVCPOST 1.91 03/08/2018 1101   TLC 2.74 03/08/2018 1101   PREFEV1FVCRT 73 03/08/2018 1101   PSTFEV1FVCRT 74 03/08/2018 1101    No results found.   Past medical hx Past Medical History:  Diagnosis Date   Acid reflux 09/21/2018   Adenocarcinoma of right lung, stage 4 ([place]) 11/17/2017   ADHD (attention deficit hyperactivity disorder)    patient denies this dx as of 03/10/22   Allergic rhinitis  08/22/2013   ANA positive 05/29/2016   Anemia    Aortic atherosclerosis ([place]) 10/23/2020   Brain metastases 03/29/2019   COPD (chronic obstructive pulmonary disease) ([place])    Coronary artery calcification seen on CT scan 10/23/2020   patient is unaware of this dx as of 03/10/22   DDD (degenerative disc disease), cervical 05/29/2016   patient denies this dx as of 03/10/22   Dyspnea    Encounter for antineoplastic chemotherapy 11/17/2017   Encounter for antineoplastic immunotherapy 02/01/2018   Goals of care, counseling/discussion 11/17/2017   High risk medication use 05/29/2016   04/29/2016: ==> plq 200 am & 100 qhs(adeq response).   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[place]) 11/04/2018   Other fatigue 05/29/2016   Pericardial effusion 10/23/2020   Pneumonia    as a child   Primary malignant neoplasm of bronchus of right lower lobe ([place]) 12/21/2017   Rheumatoid arthritis (HCC)    Rheumatoid arthritis involving multiple sites with positive rheumatoid factor ([place]) 05/29/2016   +RF +ANA +CCP    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   Smoker 05/29/2016   quit 2019   Trigger finger, left ring finger 05/29/2016   Trigger finger, right ring finger 05/29/2016   Vitamin D deficiency 05/29/2016     Social History   Tobacco Use   Smoking status: Former    Packs/day: 0.50    Years: 45.00    Total pack years: 22.50    Types: Cigarettes    Quit date: 09/29/2017    Years since quitting: 4.6   Smokeless tobacco: Never  Vaping Use   Vaping Use: Never used  Substance Use Topics   Alcohol use: Not Currently   Drug use: No    [name] reports that she quit smoking about 4 years ago. Her smoking use included cigarettes. She has a 22.50 pack-year smoking history. She has  never used smokeless tobacco. She reports that she does not currently use alcohol. She reports that she does not use drugs.  Tobacco Cessation: Former smoker, quit 2019 with a  45 pack year smoking history.    Past surgical hx, Family hx, Social hx all reviewed.  Current Outpatient Medications on File Prior to Visit  Medication Sig   acetaminophen (TYLENOL) 325 MG tablet Take 2 tablets (650 mg total) by mouth every 6 (six) hours. (Patient taking differently: Take 650 mg by mouth every 6 (six) hours as needed for moderate pain.)   Ascorbic Acid (VITAMIN C) 250 MG CHEW Chew 500 mg by mouth daily. Gummy   Cholecalciferol (VITAMIN D-3) 125 MCG (5000 UT) TABS Take 5,000 Units by mouth daily.   fluticasone (FLONASE) 50 MCG/ACT nasal spray Place 2 sprays into both nostrils daily. (Patient taking differently: Place 2 sprays into both nostrils daily as needed for allergies or rhinitis.)   folic acid (FOLVITE) 1 MG tablet Take 1 tablet (1 mg total) by mouth daily.   hydroxychloroquine (PLAQUENIL) 200 MG tablet TAKE 1 TABLET BY MOUTH TWICE DAILY MONDAY- FRIDAY ONLY   omeprazole (PRILOSEC) 20 MG capsule Take 20 mg by mouth daily.   prochlorperazine (COMPAZINE) 10 MG tablet TAKE 1 TABLET(10 MG) BY MOUTH EVERY 6 HOURS AS NEEDED FOR NAUSEA OR VOMITING   UNABLE TO FIND Med Name  Amoclav  25 mg   twice a day for infection (ulcer) in mouth   No current facility-administered medications on file prior to visit.     No Known Allergies  Review Of Systems:  Constitutional:   No  weight loss, night sweats,  Fevers, chills, +fatigue, or  lassitude.  HEENT:   No headaches,  Difficulty swallowing,  Tooth/dental problems, or  Sore throat,                No sneezing, itching, ear ache, nasal congestion, post nasal drip,   CV:  No chest pain,  Orthopnea, PND, swelling in lower extremities, anasarca, dizziness, palpitations, syncope.   GI  No heartburn, indigestion, abdominal pain, nausea, vomiting, diarrhea,  change in bowel habits, loss of appetite, bloody stools.   Resp: + shortness of breath with exertion less at rest.  No excess mucus, no productive cough,  No non-productive cough,  No coughing up of blood.  No change in color of mucus.  No wheezing.  No chest wall deformity  Skin: no rash or lesions.Right pleurex catheter, dressing CDI, site with ridge of scarring  GU: no dysuria, change in color of urine, no urgency or frequency.  No flank pain, no hematuria   MS:  No joint pain or swelling.  No decreased range of motion.  No back pain.  Psych:  No change in mood or affect. No depression or anxiety.  No memory loss.   Vital Signs BP 124/62   Pulse (!) 114   Temp 98.2 F (36.8 C) (Oral)   Ht 5' (1.524 m)   Wt 141 lb 12.8 oz (64.3 kg)   LMP 04/28/2000   SpO2 100%   BMI 27.69 kg/m    Physical Exam:  General- No distress,  A&Ox3, pleasant ENT: No sinus tenderness, TM clear, pale nasal mucosa, no oral exudate,no post nasal drip, no LAN Cardiac: S1, S2, regular rate and rhythm, no murmur Chest: No wheeze/ rales/ dullness; no accessory muscle use, no nasal flaring, no sternal retractions, slightly diminished per right side. Abd.: Soft Non-tender, ND, BS +, Body mass index is 27.69 kg/m.  Ext: No clubbing cyanosis, edema Neuro:  normal strength,slightly deconditioned ,  MAE x 4, A&O x 3 Skin: No rashes, warm and dry, Pleurex as described above.  Psych: normal mood and behavior   Assessment/Plan Stage IV  Right lung cancer,Right pleurex tube  Chronic malignant pleural effusion  Pt. Drains every other day  for 300 cc's Pain at site, ? Suture, none visualized by 3 different providers.   Plan We were unable to see a suture around your Pleurex cath. Dr. Silas Flood feels it could be a suture could be dissolving or this may just be scab/ scar tissue.  Change dressing tonight when you have help. Return to the clinic  if the site developed any redness or drainage, if the fluid became  cloudy or discolored, or if you  developed a fever.  I will contact Dr. Verlee Monte to let him know you have had some trouble with the site around the tube, and we will get you on his schedule to check the site. He has an opening 2/19,  at 9 am we can have you follow up with him them. Continue every other day drainage as you have been doing.  Remember , do not drain > 300'cc at a time. Stop draining for extreme pain  Remember to increase your oxygen to 3-4 liters before activities that make you short of breath to see if this helps you from getting so short of breath  Saturations should always be > 88%.  Call us if you need [place] sooner.  Please contact office for sooner follow up if symptoms do not improve or worsen or seek emergency care     I spent 60 minutes dedicated to the care of this patient on the date of this encounter to include pre-visit review of records, face-to-face time with the patient discussing conditions above, post visit ordering of testing, clinical documentation with the electronic health record, making appropriate referrals as documented, and communicating necessary information to the patient's healthcare team.   Dr. Silas Flood also spent 15-20 minutes evaluating her insertion site.    Magdalen Spatz, NP 06/04/2022  11:22 AM

## 2022-06-03 NOTE — Patient Instructions (Addendum)
It is good to see you today. We were unable to see a suture around your Pleurex cath. Dr. Silas Flood feels it could be a suture could be dissolving or this may just be scab/ scar tissue.  Change dressing tonight when you have help. Return to the clinic  if the site developed any redness or drainage, if the fluid became cloudy or discolored, or if you  developed a fever.  I will contact Dr. Verlee Monte to let him know you have had some trouble with the site around the tube, and we will get you on his schedule to check the site. He has an opening 2/19,  at 9 am we can have you follow up with him them. Continue every other day drainage as you have been doing.  Remember , do not drain > 300'cc at a time. Stop draining for extreme pain  Remember to increase your oxygen to 3-4 liters before activities that make you short of breath to see if this helps you from getting so short of breath  Saturations should always be > 88%.  Call us if you need [place] sooner.  Please contact office for sooner follow up if symptoms do not improve or worsen or seek emergency care

## 2022-06-04 ENCOUNTER — Encounter: Payer: Self-pay | Admitting: Acute Care

## 2022-06-14 NOTE — Progress Notes (Unsigned)
Synopsis: Referred for recurrent MPE by Bonnita Hollow, MD  Subjective:   PATIENT ID: Susan Holder GENDER: female DOB: 12/14/1955, MRN: [id number]  No chief complaint on file.  Susan Holder is a 67 y.o. female female former smoker , quit 2019 with a 45 pack year smoking history with stage IV  Right lung cancer, brain metastasis, pericardial effusion status post pericardial window, history of rheumatoid arthritis RF, ANA and anti-CCP positive.  Patient currently followed by  Dr. Earlie Server from medical oncology.  She had a Flexible bronchoscopy with bronchial alveolar lavage  and Transbronchial lung biopsy, single lobe , with right sided Pleurex tube  placement in 03/11/2022. Biopsy to of the Left upper lung was negative for a new  primary bronchogenic cancer. She requires every other day drainage of her right sided pleurex catheter.  Seen in office 06/03/22 with some discomfort around tunnel site. Still draining 300cc qod at that time.   Otherwise pertinent review of systems is negative.  Past Medical History:  Diagnosis Date   Acid reflux 09/21/2018   Adenocarcinoma of right lung, stage 4 ([place]) 11/17/2017   ADHD (attention deficit hyperactivity disorder)    patient denies this dx as of 03/10/22   Allergic rhinitis 08/22/2013   ANA positive 05/29/2016   Anemia    Aortic atherosclerosis ([place]) 10/23/2020   Brain metastases 03/29/2019   COPD (chronic obstructive pulmonary disease) ([place])    Coronary artery calcification seen on CT scan 10/23/2020   patient is unaware of this dx as of 03/10/22   DDD (degenerative disc disease), cervical 05/29/2016   patient denies this dx as of 03/10/22   Dyspnea    Encounter for antineoplastic chemotherapy 11/17/2017   Encounter for antineoplastic immunotherapy 02/01/2018   Goals of care, counseling/discussion 11/17/2017   High risk medication use 05/29/2016   04/29/2016: ==> plq 200 am & 100 qhs(adeq response).   History of blood  transfusion    x 2   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[place]) 11/04/2018   Other fatigue 05/29/2016   Pericardial effusion 10/23/2020   Pneumonia    as a child   Primary malignant neoplasm of bronchus of right lower lobe ([place]) 12/21/2017   Rheumatoid arthritis (HCC)    Rheumatoid arthritis involving multiple sites with positive rheumatoid factor ([place]) 05/29/2016   +RF +ANA +CCP    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   Smoker 05/29/2016   quit 2019   Trigger finger, left ring finger 05/29/2016   Trigger finger, right ring finger 05/29/2016   Vitamin D deficiency 05/29/2016     Family History  Problem Relation Age of Onset   Stroke Mother    Alzheimer's disease Mother    Heart disease Mother    Emphysema Father    Hypertension Brother    Heart attack Maternal Aunt    Heart failure Maternal Grandmother    Hypertension Paternal Grandmother      Past Surgical History:  Procedure Laterality Date   BRONCHIAL BIOPSY  03/11/2022   Procedure: BRONCHIAL BIOPSIES;  Surgeon: Maryjane Hurter, MD;  Location: [facility] ENDOSCOPY;  Service: Pulmonary;;   BRONCHIAL NEEDLE ASPIRATION BIOPSY  11/09/2017   Procedure: BRONCHIAL NEEDLE ASPIRATION BIOPSIES;  Surgeon: Marshell Garfinkel, MD;  Location: [facility] ENDOSCOPY;  Service: Cardiopulmonary;;   BRONCHIAL  WASHINGS  03/11/2022   Procedure: BRONCHIAL WASHINGS;  Surgeon: Maryjane Hurter, MD;  Location: [place];  Service: Pulmonary;;   CHEST TUBE INSERTION Left 05/13/2021   Procedure: INSERTION PLEURAL DRAINAGE CATHETER;  Surgeon: Lajuana Matte, MD;  Location: [place];  Service: Thoracic;  Laterality: Left;   CHEST TUBE INSERTION Right 03/11/2022   Procedure: INSERTION PLEURAL DRAINAGE CATHETER;  Surgeon: Maryjane Hurter, MD;  Location[facility] ENDOSCOPY;  Service:  Pulmonary;  Laterality: Right;  indwelling pleural catheter, w/ cuff   ENDOBRONCHIAL ULTRASOUND Bilateral 11/09/2017   Procedure: ENDOBRONCHIAL ULTRASOUND;  Surgeon: Marshell Garfinkel, MD;  Location: [facility] ENDOSCOPY;  Service: Cardiopulmonary;  Laterality: Bilateral;   NASAL SINUS SURGERY     NASAL SINUS SURGERY     THORACENTESIS Right 02/07/2021   Procedure: THORACENTESIS;  Surgeon: Lajuana Matte, MD;  Location: [place];  Service: Thoracic;  Laterality: Right;   TUBAL LIGATION     VIDEO BRONCHOSCOPY  11/09/2017   Procedure: VIDEO BRONCHOSCOPY;  Surgeon: Marshell Garfinkel, MD;  Location: [facility] ENDOSCOPY;  Service: Cardiopulmonary;;   VIDEO BRONCHOSCOPY Left 03/11/2022   Procedure: VIDEO BRONCHOSCOPY WITH FLUORO;  Surgeon: Maryjane Hurter, MD;  Location[facility] ENDOSCOPY;  Service: Pulmonary;  Laterality: Left;   XI ROBOTIC ASSISTED PERICARDIAL WINDOW Left 02/07/2021   Procedure: XI ROBOTIC ASSISTED THORACOSCOPY PERICARDIAL WINDOW;  Surgeon: Lajuana Matte, MD;  Location: [facility] OR;  Service: Thoracic;  Laterality: Left;    Social History   Socioeconomic History   Marital status: Divorced    Spouse name: Not on file   Number of children: 1   Years of education: Not on file   Highest education level: Not on file  Occupational History   Not on file  Tobacco Use   Smoking status: Former    Packs/day: 0.50    Years: 45.00    Total pack years: 22.50    Types: Cigarettes    Quit date: 09/29/2017    Years since quitting: 4.7   Smokeless tobacco: Never  Vaping Use   Vaping Use: Never used  Substance and Sexual Activity   Alcohol use: Not Currently   Drug use: No   Sexual activity: Not Currently    Birth control/protection: Post-menopausal  Other Topics Concern   Not on file  Social History Narrative   Not on file   Social Determinants of Health   Financial Resource Strain: Low Risk  (12/03/2021)   Overall Financial Resource Strain (CARDIA)    Difficulty of Paying Living Expenses: Not  hard at all  Food Insecurity: No Food Insecurity (11/06/2020)   Hunger Vital Sign    Worried About Running Out of Food in the Last Year: Never true    [place] in the Last Year: Never true  Transportation Needs: No Transportation Needs (12/03/2021)   PRAPARE - Hydrologist (Medical): No    Lack of Transportation (Non-Medical): No  Physical Activity: Inactive (12/03/2021)   Exercise Vital Sign    Days of Exercise per Week: 0 days    Minutes of Exercise per Session: 0 min  Stress: No Stress Concern Present (12/03/2021)   [place]    Feeling of Stress : Not at all  Social Connections: Unknown (12/03/2021)   Social Connection and Isolation Panel [NHANES]    Frequency of Communication with Friends and Family: More than three times a week    Frequency of Social Gatherings with Friends and  Family: Never    Attends Religious Services: More than 4 times per year    Active Member of Clubs or Organizations: Yes    Attends Archivist Meetings: More than 4 times per year    Marital Status: Not on file  Intimate Partner Violence: Not At Risk (12/03/2021)   Humiliation, Afraid, Rape, and Kick questionnaire    Fear of Current or Ex-Partner: No    Emotionally Abused: No    Physically Abused: No    Sexually Abused: No     No Known Allergies   Outpatient Medications Prior to Visit  Medication Sig Dispense Refill   acetaminophen (TYLENOL) 325 MG tablet Take 2 tablets (650 mg total) by mouth every 6 (six) hours. (Patient taking differently: Take 650 mg by mouth every 6 (six) hours as needed for moderate pain.)     Ascorbic Acid (VITAMIN C) 250 MG CHEW Chew 500 mg by mouth daily. Gummy     Cholecalciferol (VITAMIN D-3) 125 MCG (5000 UT) TABS Take 5,000 Units by mouth daily.     fluticasone (FLONASE) 50 MCG/ACT nasal spray Place 2 sprays into both nostrils daily. (Patient taking differently: Place 2  sprays into both nostrils daily as needed for allergies or rhinitis.) 16 g 6   folic acid (FOLVITE) 1 MG tablet Take 1 tablet (1 mg total) by mouth daily. 90 tablet 0   hydroxychloroquine (PLAQUENIL) 200 MG tablet TAKE 1 TABLET BY MOUTH TWICE DAILY MONDAY- FRIDAY ONLY 120 tablet 0   omeprazole (PRILOSEC) 20 MG capsule Take 20 mg by mouth daily.     prochlorperazine (COMPAZINE) 10 MG tablet TAKE 1 TABLET(10 MG) BY MOUTH EVERY 6 HOURS AS NEEDED FOR NAUSEA OR VOMITING 30 tablet 2   UNABLE TO FIND Med Name  Amoclav  25 mg   twice a day for infection (ulcer) in mouth     No facility-administered medications prior to visit.       Objective:   Physical Exam:  General appearance: 67 y.o., female, NAD, conversant  Eyes: anicteric sclerae; PERRL, tracking appropriately HENT: NCAT; MMM Neck: Trachea midline; no lymphadenopathy, no JVD Lungs: CTAB, no crackles, no wheeze, with normal respiratory effort CV: RRR, no murmur  Abdomen: Soft, non-tender; non-distended, BS present  Extremities: No peripheral edema, warm Skin: Normal turgor and texture; no rash Psych: Appropriate affect Neuro: Alert and oriented to person and place, no focal deficit     There were no vitals filed for this visit.   on *** LPM *** RA BMI Readings from Last 3 Encounters:  06/03/22 27.69 kg/m  03/19/22 25.21 kg/m  03/18/22 25.31 kg/m   Wt Readings from Last 3 Encounters:  06/03/22 141 lb 12.8 oz (64.3 kg)  03/19/22 129 lb 1.6 oz (58.6 kg)  03/18/22 129 lb 9.6 oz (58.8 kg)     CBC    Component Value Date/Time   WBC 6.7 03/19/2022 0831   WBC 6.5 10/30/2021 1428   RBC 3.39 (L) 03/19/2022 0831   HGB 10.1 (L) 03/19/2022 0831   HCT 33.4 (L) 03/19/2022 0831   PLT 259 03/19/2022 0831   MCV 98.5 03/19/2022 0831   MCH 29.8 03/19/2022 0831   MCHC 30.2 03/19/2022 0831   RDW 13.5 03/19/2022 0831   LYMPHSABS 0.9 03/19/2022 0831   MONOABS 0.7 03/19/2022 0831   EOSABS 0.1 03/19/2022 0831   BASOSABS 0.0  03/19/2022 0831     Chest Imaging: No new imaging  Pulmonary Functions Testing Results:    Latest Ref  Rng & Units 03/08/2018   11:01 AM  PFT Results  FVC-Pre L 2.00   FVC-Predicted Pre % 104   FVC-Post L 1.91   FVC-Predicted Post % 99   Pre FEV1/FVC % % 73   Post FEV1/FCV % % 74   FEV1-Pre L 1.47   FEV1-Predicted Pre % 99   FEV1-Post L 1.42   TLC L 2.74   TLC % Predicted % 65   RV % Predicted % 61      Echocardiogram 2022:   1. Left ventricular ejection fraction, by estimation, is 40 to 45%. The  left ventricle has mildly decreased function. The left ventricle  demonstrates global hypokinesis. The left ventricular internal cavity size  was mildly dilated. Left ventricular  diastolic parameters are consistent with Grade I diastolic dysfunction  (impaired relaxation).   2. Right ventricular systolic function is moderately reduced. The right  ventricular size is normal. There is normal pulmonary artery systolic  pressure.   3. There is a moderate circumferential pericardial effusion. There is no  collapse of the RV or RA. The MV inflow pattern is normal. There is no  evidence of pericardial tamponade but I would recommend close clinical  follow up for further evaluation . Marland Kitchen  Moderate pericardial effusion. The pericardial effusion is  circumferential. There is no evidence of cardiac tamponade.   4. The mitral valve is grossly normal. No evidence of mitral valve  regurgitation.   5. The aortic valve is grossly normal. Aortic valve regurgitation is  moderate.   6. The IVC is normal size. There is inspiratory collapse of the IVC. Marland Kitchen  The inferior vena cava is normal in size with greater than 50% respiratory  variability, suggesting right atrial pressure of 3 mmHg.    Assessment & Plan:   # Stage IIIA NSCLC c/b disease progression with brain mets 2020 s/p chemo, immunotherapy, RT now on observation # Recurrent MPE s/p R pleurx 03/11/22 # Chronic hypoxic respiratory  failure  Plan:      Maryjane Hurter, MD [facility] 06/14/2022 3:19 PM

## 2022-06-16 ENCOUNTER — Encounter: Payer: Self-pay | Admitting: Student

## 2022-06-16 ENCOUNTER — Ambulatory Visit: Payer: Medicare PPO | Admitting: Student

## 2022-06-16 VITALS — BP 120/70 | HR 109 | Temp 99.0°F | Ht 60.0 in | Wt 143.6 lb

## 2022-06-16 DIAGNOSIS — J90 Pleural effusion, not elsewhere classified: Secondary | ICD-10-CM | POA: Diagnosis not present

## 2022-06-16 DIAGNOSIS — R0789 Other chest pain: Secondary | ICD-10-CM | POA: Diagnosis not present

## 2022-06-16 MED ORDER — LIDOCAINE 4 % EX PTCH: 1.0000 | MEDICATED_PATCH | CUTANEOUS | 11 refills | Status: DC

## 2022-06-16 NOTE — Patient Instructions (Signed)
-   If your cousin notices redness or pus coming out from around tunnel site of catheter or if you start to develop fever, chest pain then please call our clinic [phone] - try lidocaine patch just behind your pleurx dressing to see if this helps with pain near catheter entry site. - see you in 4 months or sooner if need be!  [facility] If draining >=359ml, every other day as you are If draining 200-300 ml, drain every 3 days If draining 100-200 ml, drain every 4 days When you drain 50 ml three consecutive times in a row, notify your doctor so we can check a chest x-ray and possibly remove the catheter. Please keep a log and bring it with you to your clinic appointment.

## 2022-06-17 ENCOUNTER — Encounter (HOSPITAL_COMMUNITY): Payer: Self-pay

## 2022-06-17 ENCOUNTER — Inpatient Hospital Stay: Payer: Medicare PPO | Attending: Physician Assistant

## 2022-06-17 ENCOUNTER — Other Ambulatory Visit: Payer: Self-pay

## 2022-06-17 ENCOUNTER — Ambulatory Visit (HOSPITAL_COMMUNITY)
Admission: RE | Admit: 2022-06-17 | Discharge: 2022-06-17 | Disposition: A | Discharge status: Home / Self Care | Payer: Medicare PPO | Source: Ambulatory Visit | Attending: Internal Medicine | Admitting: Internal Medicine

## 2022-06-17 DIAGNOSIS — C3431 Malignant neoplasm of lower lobe, right bronchus or lung: Secondary | ICD-10-CM | POA: Insufficient documentation

## 2022-06-17 DIAGNOSIS — I517 Cardiomegaly: Secondary | ICD-10-CM | POA: Insufficient documentation

## 2022-06-17 DIAGNOSIS — C7931 Secondary malignant neoplasm of brain: Secondary | ICD-10-CM | POA: Insufficient documentation

## 2022-06-17 DIAGNOSIS — C3490 Malignant neoplasm of unspecified part of unspecified bronchus or lung: Secondary | ICD-10-CM

## 2022-06-17 DIAGNOSIS — Z923 Personal history of irradiation: Secondary | ICD-10-CM | POA: Insufficient documentation

## 2022-06-17 DIAGNOSIS — J479 Bronchiectasis, uncomplicated: Secondary | ICD-10-CM | POA: Diagnosis not present

## 2022-06-17 DIAGNOSIS — J90 Pleural effusion, not elsewhere classified: Secondary | ICD-10-CM | POA: Diagnosis not present

## 2022-06-17 DIAGNOSIS — J439 Emphysema, unspecified: Secondary | ICD-10-CM | POA: Diagnosis not present

## 2022-06-17 DIAGNOSIS — I2510 Atherosclerotic heart disease of native coronary artery without angina pectoris: Secondary | ICD-10-CM | POA: Diagnosis not present

## 2022-06-17 DIAGNOSIS — C3491 Malignant neoplasm of unspecified part of right bronchus or lung: Secondary | ICD-10-CM

## 2022-06-17 DIAGNOSIS — Z9221 Personal history of antineoplastic chemotherapy: Secondary | ICD-10-CM | POA: Insufficient documentation

## 2022-06-17 DIAGNOSIS — D509 Iron deficiency anemia, unspecified: Secondary | ICD-10-CM | POA: Insufficient documentation

## 2022-06-17 DIAGNOSIS — I700 Atherosclerosis of aorta: Secondary | ICD-10-CM | POA: Insufficient documentation

## 2022-06-17 DIAGNOSIS — N281 Cyst of kidney, acquired: Secondary | ICD-10-CM | POA: Diagnosis not present

## 2022-06-17 DIAGNOSIS — Z79899 Other long term (current) drug therapy: Secondary | ICD-10-CM | POA: Insufficient documentation

## 2022-06-17 DIAGNOSIS — Z87891 Personal history of nicotine dependence: Secondary | ICD-10-CM | POA: Insufficient documentation

## 2022-06-17 LAB — CMP (CANCER CENTER ONLY)
ALT: 9 U/L (ref 0–44)
AST: 16 U/L (ref 15–41)
Albumin: 3.4 g/dL — ABNORMAL LOW (ref 3.5–5.0)
Alkaline Phosphatase: 109 U/L (ref 38–126)
Anion gap: 4 — ABNORMAL LOW (ref 5–15)
BUN: 21 mg/dL (ref 8–23)
CO2: 31 mmol/L (ref 22–32)
Calcium: 9.4 mg/dL (ref 8.9–10.3)
Chloride: 104 mmol/L (ref 98–111)
Creatinine: 1.24 mg/dL — ABNORMAL HIGH (ref 0.44–1.00)
GFR, Estimated: 47 mL/min — ABNORMAL LOW (ref >60)
Glucose, Bld: 97 mg/dL (ref 70–99)
Potassium: 4.4 mmol/L (ref 3.5–5.1)
Sodium: 139 mmol/L (ref 135–145)
Total Bilirubin: 0.2 mg/dL — ABNORMAL LOW (ref 0.3–1.2)
Total Protein: 6.8 g/dL (ref 6.5–8.1)

## 2022-06-17 LAB — CBC WITH DIFFERENTIAL (CANCER CENTER ONLY)
Abs Immature Granulocytes: 0.02 10*3/uL (ref 0.00–0.07)
Basophils Absolute: 0 10*3/uL (ref 0.0–0.1)
Basophils Relative: 1 %
Eosinophils Absolute: 0.1 10*3/uL (ref 0.0–0.5)
Eosinophils Relative: 1 %
HCT: 32.9 % — ABNORMAL LOW (ref 36.0–46.0)
Hemoglobin: 10.2 g/dL — ABNORMAL LOW (ref 12.0–15.0)
Immature Granulocytes: 0 %
Lymphocytes Relative: 12 %
Lymphs Abs: 0.7 10*3/uL (ref 0.7–4.0)
MCH: 30.5 pg (ref 26.0–34.0)
MCHC: 31 g/dL (ref 30.0–36.0)
MCV: 98.5 fL (ref 80.0–100.0)
Monocytes Absolute: 0.8 10*3/uL (ref 0.1–1.0)
Monocytes Relative: 13 %
Neutro Abs: 4.7 10*3/uL (ref 1.7–7.7)
Neutrophils Relative %: 73 %
Platelet Count: 264 10*3/uL (ref 150–400)
RBC: 3.34 MIL/uL — ABNORMAL LOW (ref 3.87–5.11)
RDW: 13 % (ref 11.5–15.5)
WBC Count: 6.4 10*3/uL (ref 4.0–10.5)
nRBC: 0 % (ref 0.0–0.2)

## 2022-06-17 LAB — TSH: TSH: 3.033 u[IU]/mL (ref 0.350–4.500)

## 2022-06-17 MED ORDER — IOHEXOL 300 MG/ML  SOLN
100.0000 mL | Freq: Once | INTRAMUSCULAR | Status: AC | PRN
  Administered 2022-06-17: 100 mL via INTRAVENOUS

## 2022-06-17 MED ORDER — SODIUM CHLORIDE (PF) 0.9 % IJ SOLN
INTRAMUSCULAR | Status: AC
  Filled 2022-06-17: qty 50

## 2022-06-19 ENCOUNTER — Encounter: Payer: Self-pay | Admitting: Internal Medicine

## 2022-06-19 ENCOUNTER — Inpatient Hospital Stay: Payer: Medicare PPO | Admitting: Internal Medicine

## 2022-06-19 ENCOUNTER — Other Ambulatory Visit: Payer: Self-pay

## 2022-06-19 VITALS — BP 127/66 | HR 106 | Temp 98.2°F | Resp 18 | Wt 142.1 lb

## 2022-06-19 DIAGNOSIS — Z87891 Personal history of nicotine dependence: Secondary | ICD-10-CM | POA: Diagnosis not present

## 2022-06-19 DIAGNOSIS — C3490 Malignant neoplasm of unspecified part of unspecified bronchus or lung: Secondary | ICD-10-CM

## 2022-06-19 DIAGNOSIS — C3431 Malignant neoplasm of lower lobe, right bronchus or lung: Secondary | ICD-10-CM | POA: Diagnosis not present

## 2022-06-19 DIAGNOSIS — Z923 Personal history of irradiation: Secondary | ICD-10-CM | POA: Diagnosis not present

## 2022-06-19 DIAGNOSIS — D509 Iron deficiency anemia, unspecified: Secondary | ICD-10-CM | POA: Diagnosis not present

## 2022-06-19 DIAGNOSIS — Z79899 Other long term (current) drug therapy: Secondary | ICD-10-CM | POA: Diagnosis not present

## 2022-06-19 DIAGNOSIS — C7931 Secondary malignant neoplasm of brain: Secondary | ICD-10-CM | POA: Diagnosis not present

## 2022-06-19 DIAGNOSIS — Z9221 Personal history of antineoplastic chemotherapy: Secondary | ICD-10-CM | POA: Diagnosis not present

## 2022-06-19 NOTE — Progress Notes (Signed)
[place] [phone]   [phone]  OFFICE PROGRESS NOTE  [place], [place] [address]  DIAGNOSIS: Metastatic non-small cell lung cancer initially diagnosed as stage IIIA (T3, N1/N2, M0) non-small cell lung cancer, adenocarcinoma presented with large right lower lobe lung mass with extension to the right hilum and subcarinal area diagnosed in July 2019.  She has brain metastasis in October 2020.   Biomarker Findings Tumor Mutational Burden - TMB-Intermediate (6 Muts/Mb) Microsatellite status - MS-Stable Genomic Findings For a complete list of the genes assayed, please refer to the Appendix. NRAS Q61R ARAF amplification STK11 G56W KRAS G13D MYCN amplification MCL1 amplification NKX2-1 amplification - equivocal? TP53 G245V 7 Disease relevant genes with no reportable alterations: EGFR, ALK, BRAF, MET, ERBB2, RET, ROS1    PRIOR THERAPY: 1) Course of concurrent chemoradiation with weekly carboplatin for AUC of 2 and paclitaxel 45 mg/M2.  Status post 7 cycles.  Last dose was giving 01/11/2018. 2) Consolidation treatment with immunotherapy with Imfinzi (Durvalumab) 10 mg/KG every 2 weeks.  First dose February 09, 2018.  Status post 19 cycles. 3) status post stereotactic body radiotherapy to the enlarging right supraclavicular lymphadenopathy under the care of Dr. Lisbeth Renshaw. 4) SRS to multiple brain metastasis under the care of Dr. Lisbeth Renshaw. 5) Systemic chemotherapy with carboplatin for AUC of 5, Alimta 500 mg/M2 and Keytruda 200 mg IV every 3 weeks.  First dose 08/16/2019.  Status post 35 cycles.  Starting from cycle #5 the patient is on maintenance treatment with Alimta and Keytruda every 3 weeks. Alimta was reduced to 400 mg/m2 starting from cycle #21.  Starting from cycle #30 her treatment was changed to single agent Keytruda every 3 weeks.  Alimta was discontinued secondary to toxicity.   CURRENT THERAPY:  Observation.   INTERVAL HISTORY: Susan Holder 67 y.o. female with turns to the clinic today for follow-up visit.  The patient is feeling fine today with no concerning complaints except for the baseline shortness of breath increased with exertion and she is currently on home oxygen.  She continues to have drainage from the right Pleurx catheter around 300 cc every other day and she is monitored closely by Dr. Verlee Monte with pulmonary medicine.  She denied having any current chest pain, cough or hemoptysis.  She has no nausea, vomiting, diarrhea or constipation.  She has no headache or visual changes.  She had repeat CT scan of the chest, abdomen and pelvis performed recently and she is here for evaluation and discussion of her scan results.    MEDICAL HISTORY: Past Medical History:  Diagnosis Date   Acid reflux 09/21/2018   Adenocarcinoma of right lung, stage 4 ([place]) 11/17/2017   ADHD (attention deficit hyperactivity disorder)    patient denies this dx as of 03/10/22   Allergic rhinitis 08/22/2013   ANA positive 05/29/2016   Anemia    Aortic atherosclerosis ([place]) 10/23/2020   Brain metastases 03/29/2019   COPD (chronic obstructive pulmonary disease) ([place])    Coronary artery calcification seen on CT scan 10/23/2020   patient is unaware of this dx as of 03/10/22   DDD (degenerative disc disease), cervical 05/29/2016   patient denies this dx as of 03/10/22   Dyspnea    Encounter for antineoplastic chemotherapy 11/17/2017   Encounter for antineoplastic immunotherapy 02/01/2018   Goals of care, counseling/discussion 11/17/2017   High risk medication use 05/29/2016   04/29/2016: ==> plq 200 am & 100 qhs(adeq  response).   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[place]) 11/04/2018   Other  fatigue 05/29/2016   Pericardial effusion 10/23/2020   Pneumonia    as a child   Primary malignant neoplasm of bronchus of right lower lobe ([place]) 12/21/2017   Rheumatoid arthritis ([place])    Rheumatoid arthritis involving multiple sites with positive rheumatoid factor ([place]) 05/29/2016   +RF +ANA +CCP    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   Smoker 05/29/2016   quit 2019   Trigger finger, left ring finger 05/29/2016   Trigger finger, right ring finger 05/29/2016   Vitamin D deficiency 05/29/2016    ALLERGIES:  has No Known Allergies.  MEDICATIONS:  Current Outpatient Medications  Medication Sig Dispense Refill   acetaminophen (TYLENOL) 325 MG tablet Take 2 tablets (650 mg total) by mouth every 6 (six) hours. (Patient taking differently: Take 650 mg by mouth every 6 (six) hours as needed for moderate pain.)     Ascorbic Acid (VITAMIN C) 250 MG CHEW Chew 500 mg by mouth daily. Gummy     Cholecalciferol (VITAMIN D-3) 125 MCG (5000 UT) TABS Take 5,000 Units by mouth daily.     fluticasone (FLONASE) 50 MCG/ACT nasal spray Place 2 sprays into both nostrils daily. (Patient taking differently: Place 2 sprays into both nostrils daily as needed for allergies or rhinitis.) 16 g 6   folic acid (FOLVITE) 1 MG tablet Take 1 tablet (1 mg total) by mouth daily. 90 tablet 0   hydroxychloroquine (PLAQUENIL) 200 MG tablet TAKE 1 TABLET BY MOUTH TWICE DAILY MONDAY- FRIDAY ONLY 120 tablet 0   lidocaine 4 % Place 1 patch onto the skin daily. 30 patch 11   omeprazole (PRILOSEC) 20 MG capsule Take 20 mg by mouth daily.     prochlorperazine (COMPAZINE) 10 MG tablet TAKE 1 TABLET(10 MG) BY MOUTH EVERY 6 HOURS AS NEEDED FOR NAUSEA OR VOMITING 30 tablet 2   UNABLE TO FIND Med Name  Amoclav  25 mg   twice a day for infection (ulcer) in mouth     No current facility-administered medications for this visit.    SURGICAL HISTORY:  Past Surgical History:  Procedure Laterality Date   BRONCHIAL BIOPSY   03/11/2022   Procedure: BRONCHIAL BIOPSIES;  Surgeon: Maryjane Hurter, MD;  Location: [facility];  Service: Pulmonary;;   BRONCHIAL NEEDLE ASPIRATION BIOPSY  11/09/2017   Procedure: BRONCHIAL NEEDLE ASPIRATION BIOPSIES;  Surgeon: Marshell Garfinkel, MD;  Location: [facility];  Service: Cardiopulmonary;;   BRONCHIAL WASHINGS  03/11/2022   Procedure: BRONCHIAL WASHINGS;  Surgeon: Maryjane Hurter, MD;  Location: [facility];  Service: Pulmonary;;   CHEST TUBE INSERTION Left 05/13/2021   Procedure: INSERTION PLEURAL DRAINAGE CATHETER;  Surgeon: Lajuana Matte, MD;  Location: [facility];  Service: Thoracic;  Laterality: Left;   CHEST TUBE INSERTION Right 03/11/2022   Procedure: INSERTION PLEURAL DRAINAGE CATHETER;  Surgeon: Maryjane Hurter, MD;  Location: [facility];  Service: Pulmonary;  Laterality: Right;  indwelling pleural catheter, w/ cuff   ENDOBRONCHIAL ULTRASOUND Bilateral 11/09/2017   Procedure: ENDOBRONCHIAL ULTRASOUND;  Surgeon: Marshell Garfinkel, MD;  Location: [facility];  Service: Cardiopulmonary;  Laterality: Bilateral;   NASAL SINUS SURGERY     NASAL SINUS  SURGERY     THORACENTESIS Right 02/07/2021   Procedure: THORACENTESIS;  Surgeon: Lajuana Matte, MD;  Location: [facility];  Service: Thoracic;  Laterality: Right;   TUBAL LIGATION     VIDEO BRONCHOSCOPY  11/09/2017   Procedure: VIDEO BRONCHOSCOPY;  Surgeon: Marshell Garfinkel, MD;  Location: [facility];  Service: Cardiopulmonary;;   VIDEO BRONCHOSCOPY Left 03/11/2022   Procedure: VIDEO BRONCHOSCOPY WITH FLUORO;  Surgeon: Maryjane Hurter, MD;  Location[facility] END[facility]COPY;  Service: Pulmonary;  Laterality: Left;   XI ROBOTIC ASSISTED PERICARDIAL WINDOW Left 02/07/2021   Procedure: XI ROBOTIC ASSISTED THORACOSCOPY PERICARDIAL WINDOW;  Surgeon: Lajuana Matte, MD;  Location: [facility];  Service: Thoracic;  Laterality: Left;    REVIEW OF SYSTEMS:  Constitutional: positive for fatigue Eyes: negative Ears, nose, mouth,  throat, and face: negative Respiratory: positive for dyspnea on exertion Cardiovascular: negative Gastrointestinal: negative Genitourinary:negative Integument/breast: negative Hematologic/lymphatic: negative Musculoskeletal:negative Neurological: negative Behavioral/Psych: negative Endocrine: negative Allergic/Immunologic: negative   PHYSICAL EXAMINATION: General appearance: alert, cooperative, fatigued, and no distress Head: Normocephalic, without obvious abnormality, atraumatic Neck: no adenopathy, no JVD, supple, symmetrical, trachea midline, and thyroid not enlarged, symmetric, no tenderness/mass/nodules Lymph nodes: Cervical, supraclavicular, and axillary nodes normal. Resp: diminished breath sounds RLL and dullness to percussion RLL Back: symmetric, no curvature. ROM normal. No CVA tenderness. Cardio: regular rate and rhythm, S1, S2 normal, no murmur, click, rub or gallop GI: soft, non-tender; bowel sounds normal; no masses,  no organomegaly Extremities: extremities normal, atraumatic, no cyanosis or edema Neurologic: Alert and oriented X 3, normal strength and tone. Normal symmetric reflexes. Normal coordination and gait  ECOG PERFORMANCE STATUS: 1 - Symptomatic but completely ambulatory  Blood pressure 127/66, pulse (!) 106, temperature 98.2 F (36.8 C), temperature source Oral, resp. rate 18, weight 142 lb 1 oz (64.4 kg), last menstrual period 04/28/2000, SpO2 100 %.  LABORATORY DATA: Lab Results  Component Value Date   WBC 6.4 06/17/2022   HGB 10.2 (L) 06/17/2022   HCT 32.9 (L) 06/17/2022   MCV 98.5 06/17/2022   PLT 264 06/17/2022      Chemistry      Component Value Date/Time   NA 139 06/17/2022 1314   K 4.4 06/17/2022 1314   CL 104 06/17/2022 1314   CO2 31 06/17/2022 1314   BUN 21 06/17/2022 1314   CREATININE 1.24 (H) 06/17/2022 1314   CREATININE 0.80 07/24/2017 1438      Component Value Date/Time   CALCIUM 9.4 06/17/2022 1314   ALKPHOS 109 06/17/2022  1314   AST 16 06/17/2022 1314   ALT 9 06/17/2022 1314   BILITOT 0.2 (L) 06/17/2022 1314       RADIOGRAPHIC STUDIES: CT Chest W Contrast  Result Date: 06/18/2022 CLINICAL DATA:  Non-small-cell lung cancer staging, Stage 3A, adenocarcinoma RLL * Tracking Code: BO * EXAM: CT CHEST, ABDOMEN, AND PELVIS WITH CONTRAST TECHNIQUE: Multidetector CT imaging of the chest, abdomen and pelvis was performed following the standard protocol during bolus administration of intravenous contrast. RADIATION DOSE REDUCTION: This exam was performed according to the departmental dose-optimization program which includes automated exposure control, adjustment of the mA and/or kV according to patient size and/or use of iterative reconstruction technique. CONTRAST:  188mL OMNIPAQUE IOHEXOL 300 MG/ML  SOLN COMPARISON:  02/12/2022 and older. FINDINGS: CT CHEST FINDINGS Cardiovascular: Coronary artery calcifications are seen. Heart is mildly enlarged. Diffuse vascular calcifications along the thoracic aorta. Mediastinum/Nodes: No specific abnormal lymph node enlargement identified in the axillary region, left hilum or mediastinum. Slight soft  tissue fullness in the right hilar component with adjacent lung opacity. There is edema throughout the mediastinum. There is significant thickening along the mid to lower thoracic esophagus with a patulous lumen the appearance is similar to previous. Correlate for any symptoms of esophagitis. Lungs/Pleura: Right-sided PleurX catheter along the moderate pleural effusion the catheter is new. The effusion is slightly smaller than previous. There is significant opacity along the partially collapsed right lower lobe. Appearance is similar to previous. There is some soft tissue fullness in the. There is distortion along and bronchi in this location. Also some bandlike areas in the middle lobe. Right apical pleural thickening also seen. Please correlate with history of radiation. Centrilobular  emphysematous changes seen bilaterally. Left lung has persistent bandlike the lingula left lower lobe inferiorly but slightly improved from previous. There is some bronchiectasis the opacities in the anterior left upper lobe are also improving. The small nodular area underlying posterior left upper lobe abutting the interlobar fissure on image 60 of series 4 today measures 7 by 5 mm and previously 7 by 4 mm. The lesion also appears to be slightly more confluent today. Recommend attention on short follow-up. Musculoskeletal: Mild degenerative changes noted along the spine mild curvature. Areas of sclerosis along anterior aspect of the right-sided seventh rib is again noted and unchanged. CT ABDOMEN PELVIS FINDINGS Hepatobiliary: Slightly nodular contour to the liver. No space-occupying liver lesion. Gallbladder is nondilated. Patent portal vein. Pancreas: Unremarkable. No pancreatic ductal dilatation or surrounding inflammatory changes. Spleen: Normal in size without focal abnormality. Adrenals/Urinary Tract: Adrenal glands are preserved. Bilateral renal atrophy. No enhancing mass or collecting system dilatation. Bosniak 1 cysts. These are stable. No specific imaging follow-up. Preserved contours of the urinary bladder. Stomach/Bowel: Large bowel has moderate diffuse colonic stool. Slightly tortuous course to the transverse colon. Normal appendix. Few left-sided colonic diverticula. The stomach and small bowel are nondilated. Vascular/Lymphatic: Normal caliber aorta and IVC with scattered vascular calcifications. Plaque seen along the branch vessels of the abdominal aorta. No specific abnormal lymph node enlargement identified in the abdomen and pelvis. Reproductive: Uterus and bilateral adnexa are unremarkable. Other: No abdominal wall hernia or abnormality. No abdominopelvic ascites. Musculoskeletal: No acute or significant osseous findings. IMPRESSION: CT CHEST: 1. Right-sided PleurX catheter along the moderate  pleural effusion. The effusion is slightly smaller than previous. 2. Stable opacity along the partially collapsed right lower lobe as well as some bandlike areas of opacity elsewhere in the right lung with distortion on central architecture to the hilum. Please correlate with history of radiation. 3. Persistent bandlike opacities in the lingula left lower lobe inferiorly and anterior left upper lobe are slightly improved from previous. 4. Stable in size 7 mm nodule in the left upper lobe abutting the interlobar fissure. Lesion is slightly more confluence. Recommend attention on short follow-up. CT ABDOMEN AND PELVIS: 1. No evidence of metastatic disease in the abdomen or pelvis. 2. Moderate colonic stool. Aortic Atherosclerosis (ICD10-I70.0) and Emphysema (ICD10-J43.9). Electronically Signed   By: Jill Side M.D.   On: 06/18/2022 11:57   CT Abdomen Pelvis W Contrast  Result Date: 06/18/2022 CLINICAL DATA:  Non-small-cell lung cancer staging, Stage 3A, adenocarcinoma RLL * Tracking Code: BO * EXAM: CT CHEST, ABDOMEN, AND PELVIS WITH CONTRAST TECHNIQUE: Multidetector CT imaging of the chest, abdomen and pelvis was performed following the standard protocol during bolus administration of intravenous contrast. RADIATION DOSE REDUCTION: This exam was performed according to the departmental dose-optimization program which includes automated exposure control, adjustment of  the mA and/or kV according to patient size and/or use of iterative reconstruction technique. CONTRAST:  168mL OMNIPAQUE IOHEXOL 300 MG/ML  SOLN COMPARISON:  02/12/2022 and older. FINDINGS: CT CHEST FINDINGS Cardiovascular: Coronary artery calcifications are seen. Heart is mildly enlarged. Diffuse vascular calcifications along the thoracic aorta. Mediastinum/Nodes: No specific abnormal lymph node enlargement identified in the axillary region, left hilum or mediastinum. Slight soft tissue fullness in the right hilar component with adjacent lung  opacity. There is edema throughout the mediastinum. There is significant thickening along the mid to lower thoracic esophagus with a patulous lumen the appearance is similar to previous. Correlate for any symptoms of esophagitis. Lungs/Pleura: Right-sided PleurX catheter along the moderate pleural effusion the catheter is new. The effusion is slightly smaller than previous. There is significant opacity along the partially collapsed right lower lobe. Appearance is similar to previous. There is some soft tissue fullness in the. There is distortion along and bronchi in this location. Also some bandlike areas in the middle lobe. Right apical pleural thickening also seen. Please correlate with history of radiation. Centrilobular emphysematous changes seen bilaterally. Left lung has persistent bandlike the lingula left lower lobe inferiorly but slightly improved from previous. There is some bronchiectasis the opacities in the anterior left upper lobe are also improving. The small nodular area underlying posterior left upper lobe abutting the interlobar fissure on image 60 of series 4 today measures 7 by 5 mm and previously 7 by 4 mm. The lesion also appears to be slightly more confluent today. Recommend attention on short follow-up. Musculoskeletal: Mild degenerative changes noted along the spine mild curvature. Areas of sclerosis along anterior aspect of the right-sided seventh rib is again noted and unchanged. CT ABDOMEN PELVIS FINDINGS Hepatobiliary: Slightly nodular contour to the liver. No space-occupying liver lesion. Gallbladder is nondilated. Patent portal vein. Pancreas: Unremarkable. No pancreatic ductal dilatation or surrounding inflammatory changes. Spleen: Normal in size without focal abnormality. Adrenals/Urinary Tract: Adrenal glands are preserved. Bilateral renal atrophy. No enhancing mass or collecting system dilatation. Bosniak 1 cysts. These are stable. No specific imaging follow-up. Preserved contours  of the urinary bladder. Stomach/Bowel: Large bowel has moderate diffuse colonic stool. Slightly tortuous course to the transverse colon. Normal appendix. Few left-sided colonic diverticula. The stomach and small bowel are nondilated. Vascular/Lymphatic: Normal caliber aorta and IVC with scattered vascular calcifications. Plaque seen along the branch vessels of the abdominal aorta. No specific abnormal lymph node enlargement identified in the abdomen and pelvis. Reproductive: Uterus and bilateral adnexa are unremarkable. Other: No abdominal wall hernia or abnormality. No abdominopelvic ascites. Musculoskeletal: No acute or significant osseous findings. IMPRESSION: CT CHEST: 1. Right-sided PleurX catheter along the moderate pleural effusion. The effusion is slightly smaller than previous. 2. Stable opacity along the partially collapsed right lower lobe as well as some bandlike areas of opacity elsewhere in the right lung with distortion on central architecture to the hilum. Please correlate with history of radiation. 3. Persistent bandlike opacities in the lingula left lower lobe inferiorly and anterior left upper lobe are slightly improved from previous. 4. Stable in size 7 mm nodule in the left upper lobe abutting the interlobar fissure. Lesion is slightly more confluence. Recommend attention on short follow-up. CT ABDOMEN AND PELVIS: 1. No evidence of metastatic disease in the abdomen or pelvis. 2. Moderate colonic stool. Aortic Atherosclerosis (ICD10-I70.0) and Emphysema (ICD10-J43.9). Electronically Signed   By: Jill Side M.D.   On: 06/18/2022 11:57    ASSESSMENT AND PLAN: This is a very  pleasant 67 years old African-American female with metastatic non-small cell lung cancer initially diagnosed with a stage IIIA non-small cell lung cancer, adenocarcinoma.  She underwent a course of concurrent chemoradiation with weekly carboplatin and paclitaxel status post 7 cycles with partial response.   The patient  tolerated this course of treatment well except for mild odynophagia and dysphagia. She completed on consolidation treatment with immunotherapy with Imfinzi (Durvalumab) status post 18 cycles. She also completed SBRT to the right supraclavicular lymphadenopathy. The patient had evidence for multiple brain metastasis in October 2020 and she underwent SRS treatment to this lesion under the care of Dr. Lisbeth Renshaw. The patient had evidence for disease progression and she started systemic chemotherapy with carboplatin, Alimta and Keytruda status post 35 cycles.  Starting from cycle #5 she is on maintenance treatment with Alimta and Keytruda every 3 weeks.  Starting from cycle #30 the patient is on treatment with single agent Keytruda 200 Mg IV every 3 weeks.  Alimta was discontinued secondary to intolerance. The patient is currently on observation and she is feeling fine except for the baseline shortness of breath and mild fatigue. She had repeat CT scan of the chest, abdomen and pelvis performed recently.  I personally and independently reviewed the scan images and discussed the result with the patient today. Her scan showed no concerning findings for disease progression. I recommended for her to continue on observation with repeat CT scan of the chest, abdomen and pelvis in 4 months. For the iron deficiency anemia, she will continue with oral iron tablets at regular basis with vitamin C or orange juice. For the right pleural effusion, she will continue drainage via the Pleurx catheter and she may be able to decrease her drainage to twice a week because of the small amount that is drained every other day. The patient was advised to call immediately if she has any other concerning symptoms in the interval. The total time spent in the appointment was 30 minutes.  Disclaimer: This note was dictated with voice recognition software. Similar sounding words can inadvertently be transcribed and may not be corrected upon  review.

## 2022-06-20 ENCOUNTER — Other Ambulatory Visit: Payer: Self-pay

## 2022-06-23 DIAGNOSIS — J90 Pleural effusion, not elsewhere classified: Secondary | ICD-10-CM | POA: Diagnosis not present

## 2022-06-23 DIAGNOSIS — C7800 Secondary malignant neoplasm of unspecified lung: Secondary | ICD-10-CM | POA: Diagnosis not present

## 2022-06-26 ENCOUNTER — Ambulatory Visit
Admission: RE | Admit: 2022-06-26 | Discharge: 2022-06-26 | Disposition: A | Discharge status: Home / Self Care | Payer: Medicare PPO | Source: Ambulatory Visit | Attending: Radiation Oncology | Admitting: Radiation Oncology

## 2022-06-26 DIAGNOSIS — C7931 Secondary malignant neoplasm of brain: Secondary | ICD-10-CM

## 2022-06-26 DIAGNOSIS — C3490 Malignant neoplasm of unspecified part of unspecified bronchus or lung: Secondary | ICD-10-CM | POA: Diagnosis not present

## 2022-06-26 MED ORDER — GADOPICLENOL 0.5 MMOL/ML IV SOLN
6.0000 mL | Freq: Once | INTRAVENOUS | Status: AC | PRN
  Administered 2022-06-26: 6 mL via INTRAVENOUS

## 2022-06-29 ENCOUNTER — Other Ambulatory Visit: Payer: Self-pay | Admitting: Internal Medicine

## 2022-06-29 DIAGNOSIS — C3491 Malignant neoplasm of unspecified part of right bronchus or lung: Secondary | ICD-10-CM

## 2022-06-30 ENCOUNTER — Inpatient Hospital Stay: Payer: Medicare PPO | Attending: Physician Assistant

## 2022-06-30 ENCOUNTER — Encounter: Payer: Self-pay | Admitting: Radiation Oncology

## 2022-06-30 ENCOUNTER — Ambulatory Visit
Admission: RE | Admit: 2022-06-30 | Discharge: 2022-06-30 | Disposition: A | Discharge status: Home / Self Care | Payer: Medicare PPO | Source: Ambulatory Visit | Attending: Radiation Oncology | Admitting: Radiation Oncology

## 2022-06-30 ENCOUNTER — Other Ambulatory Visit: Payer: Self-pay | Admitting: Internal Medicine

## 2022-06-30 DIAGNOSIS — C3431 Malignant neoplasm of lower lobe, right bronchus or lung: Secondary | ICD-10-CM

## 2022-06-30 DIAGNOSIS — C7931 Secondary malignant neoplasm of brain: Secondary | ICD-10-CM | POA: Diagnosis not present

## 2022-06-30 DIAGNOSIS — C3491 Malignant neoplasm of unspecified part of right bronchus or lung: Secondary | ICD-10-CM

## 2022-06-30 NOTE — Progress Notes (Signed)
Radiation Oncology         [phone] ________________________________  Outpatient Follow Up - Conducted via telephone at patient request.  I spoke with the patient to conduct this visit via telephone. The patient was notified in advance and was offered an in person or telemedicine meeting to allow for face to face communication but instead preferred to proceed with a telephone visit.   Name: Susan Holder MRN: [id number]  Date of Service: 06/30/2022  DOB: 03-19-1956   DIAGNOSIS:   Progressive Metastatic Stage IIIA, cT3N1-2M0 NSCLC, adenocarcinoma of the right lower lobe with brain metastases   INTERVAL SINCE LAST RADIOTHERAPY:   03/09/2019 SRS Treatment: Each site was treated to 20 Gy in a single fraction PTV1 Post Cerebellum 49m PTV2 Lt Cerebellum 1452mPTV3 Lo Rt Vent 52m53mTV4 Rt Frontal 5mm50mV5 Lt Frontal Operc 5mm 652m6 Lt Sylv 5mm  46m1/2020-12/20/2018: The patient's right supraclavicular nodes were treated to 60 Gy over 25 fractions.   11/30/17-01/11/18:  66 Gy to the right chest and regional nodes over 33 fractions  NARRATIVE: Susan Holder-known 66 y.o14 patient to our service with a history of adenocarcinoma of the right lower lobe.  When she was originally diagnosed in the summer 2019 she presented with local regional disease and received chemoradiation.  Unfortunately she recurred in the summer 2020, she did receive palliative radiotherapy to the right supraclavicular node, and in November 2020 was found to have 6 lesions in the brain felt to be metastatic for which she underwent SRS treatment in November 2020.    She has been followed with no evidence of disease in the brain.  Her last immunotherapy was given on 09/04/2021. Recent imaging of the chest shows concerns for inflammatory changes in the left lung base, she is going to be undergoing biopsy of this with Dr. Icard.Valeta Harms also has a right effusion that will be treated with Pleurx catheter.  Her recent MRI  on 02/26/2022 showed mildly increased edema surrounding the right frontal lobe lesion currently measuring 5 mm additional lesions appeared slightly increased but in discussion in brain oncology conference it was felt that these changes were all consistent with inflammatory change.  Interestingly she had a 5 mm area of posttreatment effect in the left cerebellum on her scan in May 2023 but she did have changes in dizziness and vertigo and presented in July to the emergency department at that time a 1.5 Tesla MRI was performed and the lesion in the left cerebellum measured 7 mm.  Again we discussed the limitations but her symptoms have resolved without intervention.  She is contacted today about the results of her MRI from 02/26/2022.  On review of systems, the patient reports that she is doing fairly well. She reports she reports she is still emptying her pleurex catheter but Dr. MohameJulien Nordmannsted emptying this less often, at twice a week. She is still quite fatigued and reports an occasional headache she feels is related to stress. She denies any progressive headaches, changes in behavior, cognitive function, or movement. No other complaints are verbalized.   PAST MEDICAL HISTORY:  Past Medical History:  Diagnosis Date   Acid reflux 09/21/2018   Adenocarcinoma of right lung, stage 4 (HCC) 0[place]3/2019   ADHD (attention deficit hyperactivity disorder)    patient denies this dx as of 03/10/22   Allergic rhinitis 08/22/2013   ANA positive 05/29/2016   Anemia    Aortic atherosclerosis (HCC) 0[place]8/2022   Brain metastases 03/29/2019  COPD (chronic obstructive pulmonary disease) (HCC)    Coronary artery calcification seen on CT scan 10/23/2020   patient is unaware of this dx as of 03/10/22   DDD (degenerative disc disease), cervical 05/29/2016   patient denies this dx as of 03/10/22   Dyspnea    Encounter for antineoplastic chemotherapy 11/17/2017   Encounter for antineoplastic immunotherapy 02/01/2018    Goals of care, counseling/discussion 11/17/2017   High risk medication use 05/29/2016   04/29/2016: ==> plq 200 am & 100 qhs(adeq response).   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[place]) 11/04/2018   Other fatigue 05/29/2016   Pericardial effusion 10/23/2020   Pneumonia    as a child   Primary malignant neoplasm of bronchus of right lower lobe ([place]) 12/21/2017   Rheumatoid arthritis (HCC)    Rheumatoid arthritis involving multiple sites with positive rheumatoid factor ([place]) 05/29/2016   +RF +ANA +CCP    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   Smoker 05/29/2016   quit 2019   Trigger finger, left ring finger 05/29/2016   Trigger finger, right ring finger 05/29/2016   Vitamin D deficiency 05/29/2016    PAST SURGICAL HISTORY: Past Surgical History:  Procedure Laterality Date   BRONCHIAL BIOPSY  03/11/2022   Procedure: BRONCHIAL BIOPSIES;  Surgeon: Maryjane Hurter, MD;  Location: [place];  Service: Pulmonary;;   BRONCHIAL NEEDLE ASPIRATION BIOPSY  11/09/2017   Procedure: BRONCHIAL NEEDLE ASPIRATION BIOPSIES;  Surgeon: Marshell Garfinkel, MD;  Location: [facility];  Service: Cardiopulmonary;;   BRONCHIAL WASHINGS  03/11/2022   Procedure: BRONCHIAL WASHINGS;  Surgeon: Maryjane Hurter, MD;  Location: [place];  Service: Pulmonary;;   CHEST TUBE INSERTION Left 05/13/2021   Procedure: INSERTION PLEURAL DRAINAGE CATHETER;  Surgeon: Lajuana Matte, MD;  Location: [place];  Service: Thoracic;  Laterality: Left;   CHEST TUBE INSERTION Right 03/11/2022   Procedure: INSERTION PLEURAL DRAINAGE CATHETER;  Surgeon: Maryjane Hurter, MD;  Location: [facility];  Service: Pulmonary;  Laterality: Right;  indwelling pleural catheter, w/ cuff   ENDOBRONCHIAL ULTRASOUND  Bilateral 11/09/2017   Procedure: ENDOBRONCHIAL ULTRASOUND;  Surgeon: Marshell Garfinkel, MD;  Location: [facility];  Service: Cardiopulmonary;  Laterality: Bilateral;   NASAL SINUS SURGERY     NASAL SINUS SURGERY     THORACENTESIS Right 02/07/2021   Procedure: THORACENTESIS;  Surgeon: Lajuana Matte, MD;  Location: [place];  Service: Thoracic;  Laterality: Right;   TUBAL LIGATION     VIDEO BRONCHOSCOPY  11/09/2017   Procedure: VIDEO BRONCHOSCOPY;  Surgeon: Marshell Garfinkel, MD;  Location: [facility];  Service: Cardiopulmonary;;   VIDEO BRONCHOSCOPY Left 03/11/2022   Procedure: VIDEO BRONCHOSCOPY WITH FLUORO;  Surgeon: Maryjane Hurter, MD;  Location: [facility]COPY;  Service: Pulmonary;  Laterality: Left;   XI ROBOTIC ASSISTED PERICARDIAL WINDOW Left 02/07/2021   Procedure: XI ROBOTIC ASSISTED THORACOSCOPY PERICARDIAL WINDOW;  Surgeon: Lajuana Matte, MD;  Location: [facility];  Service: Thoracic;  Laterality: Left;    PAST SOCIAL HISTORY:  Social History   Socioeconomic History   Marital status: Divorced    Spouse name: Not on file   Number of children: 1   Years of education: Not on file   Highest education level: Not on file  Occupational History   Not on file  Tobacco Use   Smoking status: Former    Packs/day: 0.50    Years: 45.00    Total pack years: 22.50    Types: Cigarettes    Quit date: 09/29/2017    Years since quitting: 4.7   Smokeless tobacco: Never  Vaping Use   Vaping Use: Never used  Substance and Sexual Activity   Alcohol use: Not Currently   Drug use: No   Sexual activity: Not Currently    Birth control/protection: Post-menopausal  Other Topics Concern   Not on file  Social History Narrative   Not on file   Social Determinants of Health   Financial Resource Strain: Low Risk  (12/03/2021)   Overall Financial Resource Strain (CARDIA)    Difficulty of Paying Living Expenses: Not hard at all  Food Insecurity: No Food Insecurity (11/06/2020)   Hunger  Vital Sign    Worried About Running Out of Food in the Last Year: Never true    Ran Out of Food in the Last Year: Never true  Transportation Needs: No Transportation Needs (12/03/2021)   PRAPARE - Hydrologist (Medical): No    Lack of Transportation (Non-Medical): No  Physical Activity: Inactive (12/03/2021)   Exercise Vital Sign    Days of Exercise per Week: 0 days    Minutes of Exercise per Session: 0 min  Stress: No Stress Concern Present (12/03/2021)   [place]    Feeling of Stress : Not at all  Social Connections: Unknown (12/03/2021)   Social Connection and Isolation Panel [NHANES]    Frequency of Communication with Friends and Family: More than three times a week    Frequency of Social Gatherings with Friends and Family: Never    Attends Religious Services: More than 4 times per year    Active Member of [employer] or Organizations: Yes    Attends Archivist Meetings: More than 4 times per year    Marital Status: Not on file  Intimate Partner Violence: Not At Risk (12/03/2021)   Humiliation, Afraid, Rape, and Kick questionnaire    Fear of Current or Ex-Partner: No    Emotionally Abused: No    Physically Abused: No    Sexually Abused: No  The patient is divorced.  She lives in [place] and her 40 year old granddaughter is also at home with her.  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  PAST FAMILY HISTORY: Family History  Problem Relation Age of Onset   Stroke Mother    Alzheimer's disease Mother    Heart disease Mother    Emphysema Father    Hypertension Brother    Heart attack Maternal Aunt    Heart failure Maternal Grandmother    Hypertension Paternal Grandmother     MEDICATIONS  Current Outpatient Medications  Medication Sig Dispense Refill   acetaminophen (TYLENOL) 325 MG tablet Take 2 tablets (650 mg total) by mouth every 6 (six) hours. (Patient taking  differently: Take 650 mg by mouth every 6 (six) hours as needed for moderate pain.)     Ascorbic Acid (VITAMIN C) 250 MG CHEW Chew 500 mg by mouth daily. Gummy     Cholecalciferol (VITAMIN D-3) 125 MCG (5000 UT) TABS Take 5,000 Units by mouth daily.     fluticasone (FLONASE) 50 MCG/ACT nasal spray Place 2 sprays into both nostrils daily. (Patient taking differently: Place 2 sprays into  both nostrils daily as needed for allergies or rhinitis.) 16 g 6   folic acid (FOLVITE) 1 MG tablet Take 1 tablet (1 mg total) by mouth daily. 90 tablet 0   hydroxychloroquine (PLAQUENIL) 200 MG tablet TAKE 1 TABLET BY MOUTH TWICE DAILY MONDAY- FRIDAY ONLY 120 tablet 0   lidocaine 4 % Place 1 patch onto the skin daily. 30 patch 11   omeprazole (PRILOSEC) 20 MG capsule Take 20 mg by mouth daily.     prochlorperazine (COMPAZINE) 10 MG tablet TAKE 1 TABLET(10 MG) BY MOUTH EVERY 6 HOURS AS NEEDED FOR NAUSEA OR VOMITING 30 tablet 2   UNABLE TO FIND Med Name  Amoclav  25 mg   twice a day for infection (ulcer) in mouth     No current facility-administered medications for this encounter.    ALLERGIES: No Known Allergies  PHYSICAL EXAM:  Unable to assess due to encounter type.   IMPRESSION/PLAN: 1. Progressive Metastatic Stage IIIA, cT3N1-2M0 NSCLC, adenocarcinoma of the right lower lobe with brain metastases. The patient's scans were discussed in brain oncology conference, and it is favored that these changes remain stable but still related to her somewhat recent immunotherapy treatments last year. With the waxing and waning, we will plan repeat MRI in 3 months time. She remains asymptomatic but she will let us know if there are any changes in her function. She will follow up with Dr. Julien Nordmann as well.  2. Pleural effusion.  She will follow up with Dr. Verlee Monte and we will follow this expectantly.   This encounter was conducted via telephone.  The patient has provided two factor identification and has given verbal  consent for this type of encounter and has been advised to only accept a meeting of this type in a secure network environment. The time spent during this encounter was 35 minutes including preparation, discussion, and coordination of the patient's care. The attendants for this meeting include Shona Simpson, [facility] and Farrel Gobble.  During the encounter, Shona Simpson [facility] was located in the Radiation Department at the [facility]. JAELANI BOEING was located remotely at home.    Carola Rhine, PAC

## 2022-06-30 NOTE — Progress Notes (Signed)
Telephone nursing appointment for patient to receive most recent scan results from . I verified patient's identity and began nursing interview. Patient reports doing well.    Meaningful use complete.   Patient aware of their 2:30pm-06/30/22 telephone appointment w/ Shona Simpson PA-C. I left my extension [phone] in case patient needs anything. Patient verbalized understanding. This concludes the nursing interview.   Patient contact [phone]     Leandra Kern, LPN

## 2022-07-03 ENCOUNTER — Other Ambulatory Visit: Payer: Self-pay | Admitting: Medical Oncology

## 2022-07-03 ENCOUNTER — Telehealth: Payer: Self-pay | Admitting: Student

## 2022-07-03 NOTE — Telephone Encounter (Signed)
Can we set her up with clinic or video visit to discuss how she's doing with pleurx catheter any time that works for her?  Thanks!

## 2022-07-21 ENCOUNTER — Other Ambulatory Visit: Payer: Self-pay | Admitting: Radiation Therapy

## 2022-07-21 DIAGNOSIS — C7931 Secondary malignant neoplasm of brain: Secondary | ICD-10-CM

## 2022-07-22 ENCOUNTER — Telehealth: Payer: Self-pay | Admitting: Radiation Therapy

## 2022-07-22 NOTE — Telephone Encounter (Signed)
I spoke with pt about her upcoming brain MRI and telephone follow-up with Bryson Ha. She uses MyChart and has access to these, and will call me back if she has questions or a conflict.   Mont Dutton R.T.(R)(T) Radiation Special Procedures Navigator

## 2022-07-26 DIAGNOSIS — C3490 Malignant neoplasm of unspecified part of unspecified bronchus or lung: Secondary | ICD-10-CM | POA: Diagnosis not present

## 2022-07-27 NOTE — Progress Notes (Unsigned)
Synopsis: Referred for recurrent MPE by Bonnita Hollow, MD  Subjective:   PATIENT ID: Susan Holder GENDER: female DOB: [birth year], MRN: [id number]  No chief complaint on file.  Susan Holder is a 67 y.o. female female former smoker , quit 2019 with a 45 pack year smoking history with stage IV  Right lung cancer, brain metastasis, pericardial effusion status post pericardial window, history of rheumatoid arthritis RF, ANA and anti-CCP positive.  Patient currently followed by  Dr. Earlie Server from medical oncology.  She had a Flexible bronchoscopy with bronchial alveolar lavage  and Transbronchial lung biopsy, single lobe , with right sided Pleurex tube  placement in 03/11/2022. Biopsy to of the Left upper lung was negative for a new  primary bronchogenic cancer. She requires every other day drainage of her right sided pleurex catheter.  Seen in office 06/03/22 with some discomfort around tunnel site. Still draining 300cc qod at that time, which is still the case - still yellow. Had scab near exit site. No surrounding erythema or drainage from around catheter that she's aware of - her cousin who is CMA at least hasn't mentioned any. No fever.  Uses oxygen 2L with activity.   Interval HPI: Encouraged to drain pleurx twice weekly at last visit with Dr. Earlie Server given small quantity of drainage.  Surveillance imaging in February without disease progression on observation  Otherwise pertinent review of systems is negative.  Past Medical History:  Diagnosis Date   Acid reflux 09/21/2018   Adenocarcinoma of right lung, stage 4 ([place]) 11/17/2017   ADHD (attention deficit hyperactivity disorder)    patient denies this dx as of 03/10/22   Allergic rhinitis 08/22/2013   ANA positive 05/29/2016   Anemia    Aortic atherosclerosis ([place]) 10/23/2020   Brain metastases 03/29/2019   COPD (chronic obstructive pulmonary disease) ([place])    Coronary artery calcification seen on CT scan 10/23/2020    patient is unaware of this dx as of 03/10/22   DDD (degenerative disc disease), cervical 05/29/2016   patient denies this dx as of 03/10/22   Dyspnea    Encounter for antineoplastic chemotherapy 11/17/2017   Encounter for antineoplastic immunotherapy 02/01/2018   Goals of care, counseling/discussion 11/17/2017   High risk medication use 05/29/2016   04/29/2016: ==> plq 200 am & 100 qhs(adeq response).   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[place]) 11/04/2018   Other fatigue 05/29/2016   Pericardial effusion 10/23/2020   Pneumonia    as a child   Primary malignant neoplasm of bronchus of right lower lobe ([place]) 12/21/2017   Rheumatoid arthritis (HCC)    Rheumatoid arthritis involving multiple sites with positive rheumatoid factor ([place]) 05/29/2016   +RF +ANA +CCP    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   Smoker 05/29/2016   quit 2019   Trigger finger, left ring finger 05/29/2016   Trigger finger, right ring finger 05/29/2016   Vitamin D deficiency 05/29/2016     Family History  Problem Relation Age of Onset   Stroke Mother    Alzheimer's disease Mother    Heart disease Mother    Emphysema Father    Hypertension Brother    Heart attack Maternal Aunt    Heart failure  Maternal Grandmother    Hypertension Paternal Grandmother      Past Surgical History:  Procedure Laterality Date   BRONCHIAL BIOPSY  03/11/2022   Procedure: BRONCHIAL BIOPSIES;  Surgeon: Maryjane Hurter, MD;  Location: [facility];  Service: Pulmonary;;   BRONCHIAL NEEDLE ASPIRATION BIOPSY  11/09/2017   Procedure: BRONCHIAL NEEDLE ASPIRATION BIOPSIES;  Surgeon: Marshell Garfinkel, MD;  Location: [facility];  Service: Cardiopulmonary;;   BRONCHIAL WASHINGS  03/11/2022   Procedure: BRONCHIAL WASHINGS;   Surgeon: Maryjane Hurter, MD;  Location: [facility];  Service: Pulmonary;;   CHEST TUBE INSERTION Left 05/13/2021   Procedure: INSERTION PLEURAL DRAINAGE CATHETER;  Surgeon: Lajuana Matte, MD;  Location: [facility];  Service: Thoracic;  Laterality: Left;   CHEST TUBE INSERTION Right 03/11/2022   Procedure: INSERTION PLEURAL DRAINAGE CATHETER;  Surgeon: Maryjane Hurter, MD;  Location: [facility] ENDOSCOPY;  Service: Pulmonary;  Laterality: Right;  indwelling pleural catheter, w/ cuff   ENDOBRONCHIAL ULTRASOUND Bilateral 11/09/2017   Procedure: ENDOBRONCHIAL ULTRASOUND;  Surgeon: Marshell Garfinkel, MD;  Location: [facility];  Service: Cardiopulmonary;  Laterality: Bilateral;   NASAL SINUS SURGERY     NASAL SINUS SURGERY     THORACENTESIS Right 02/07/2021   Procedure: THORACENTESIS;  Surgeon: Lajuana Matte, MD;  Location: [facility];  Service: Thoracic;  Laterality: Right;   TUBAL LIGATION     VIDEO BRONCHOSCOPY  11/09/2017   Procedure: VIDEO BRONCHOSCOPY;  Surgeon: Marshell Garfinkel, MD;  Location: [facility];  Service: Cardiopulmonary;;   VIDEO BRONCHOSCOPY Left 03/11/2022   Procedure: VIDEO BRONCHOSCOPY WITH FLUORO;  Surgeon: Maryjane Hurter, MD;  Location[facility] END[facility]COPY;  Service: Pulmonary;  Laterality: Left;   XI ROBOTIC ASSISTED PERICARDIAL WINDOW Left 02/07/2021   Procedure: XI ROBOTIC ASSISTED THORACOSCOPY PERICARDIAL WINDOW;  Surgeon: Lajuana Matte, MD;  Location: [facility];  Service: Thoracic;  Laterality: Left;    Social History   Socioeconomic History   Marital status: Divorced    Spouse name: Not on file   Number of children: 1   Years of education: Not on file   Highest education level: Not on file  Occupational History   Not on file  Tobacco Use   Smoking status: Former    Packs/day: 0.50    Years: 45.00    Additional pack years: 0.00    Total pack years: 22.50    Types: Cigarettes    Quit date: 09/29/2017    Years since quitting: 4.8   Smokeless tobacco: Never   Vaping Use   Vaping Use: Never used  Substance and Sexual Activity   Alcohol use: Not Currently   Drug use: No   Sexual activity: Not Currently    Birth control/protection: Post-menopausal  Other Topics Concern   Not on file  Social History Narrative   Not on file   Social Determinants of Health   Financial Resource Strain: Low Risk  (12/03/2021)   Overall Financial Resource Strain (CARDIA)    Difficulty of Paying Living Expenses: Not hard at all  Food Insecurity: No Food Insecurity (11/06/2020)   Hunger Vital Sign    Worried About Running Out of Food in the Last Year: Never true    [place] in the Last Year: Never true  Transportation Needs: No Transportation Needs (12/03/2021)   PRAPARE - Hydrologist (Medical): No    Lack of Transportation (Non-Medical): No  Physical Activity: Inactive (12/03/2021)   Exercise Vital Sign    Days of Exercise  per Week: 0 days    Minutes of Exercise per Session: 0 min  Stress: No Stress Concern Present (12/03/2021)   [place]    Feeling of Stress : Not at all  Social Connections: Unknown (12/03/2021)   Social Connection and Isolation Panel [NHANES]    Frequency of Communication with Friends and Family: More than three times a week    Frequency of Social Gatherings with Friends and Family: Never    Attends Religious Services: More than 4 times per year    Active Member of [employer] or Organizations: Yes    Attends Music therapist: More than 4 times per year    Marital Status: Not on file  Intimate Partner Violence: Not At Risk (12/03/2021)   Humiliation, Afraid, Rape, and Kick questionnaire    Fear of Current or Ex-Partner: No    Emotionally Abused: No    Physically Abused: No    Sexually Abused: No     No Known Allergies   Outpatient Medications Prior to Visit  Medication Sig Dispense Refill   acetaminophen (TYLENOL) 325 MG tablet  Take 2 tablets (650 mg total) by mouth every 6 (six) hours. (Patient taking differently: Take 650 mg by mouth every 6 (six) hours as needed for moderate pain.)     Ascorbic Acid (VITAMIN C) 250 MG CHEW Chew 500 mg by mouth daily. Gummy     Cholecalciferol (VITAMIN D-3) 125 MCG (5000 UT) TABS Take 5,000 Units by mouth daily.     fluticasone (FLONASE) 50 MCG/ACT nasal spray Place 2 sprays into both nostrils daily. (Patient taking differently: Place 2 sprays into both nostrils daily as needed for allergies or rhinitis.) 16 g 6   hydroxychloroquine (PLAQUENIL) 200 MG tablet TAKE 1 TABLET BY MOUTH TWICE DAILY MONDAY- FRIDAY ONLY 120 tablet 0   lidocaine 4 % Place 1 patch onto the skin daily. 30 patch 11   omeprazole (PRILOSEC) 20 MG capsule Take 20 mg by mouth daily.     prochlorperazine (COMPAZINE) 10 MG tablet TAKE 1 TABLET(10 MG) BY MOUTH EVERY 6 HOURS AS NEEDED FOR NAUSEA OR VOMITING 30 tablet 2   UNABLE TO FIND Med Name  Amoclav  25 mg   twice a day for infection (ulcer) in mouth     No facility-administered medications prior to visit.       Objective:   Physical Exam:  General appearance: 67 y.o., female, NAD, conversant  Eyes: anicteric sclerae; PERRL, tracking appropriately HENT: NCAT; MMM Neck: Trachea midline; no lymphadenopathy, no JVD Lungs: CTAB, no crackles, no wheeze, with normal respiratory effort CV: RRR, no murmur  Abdomen: Soft, non-tender; non-distended, BS present  Extremities: No peripheral edema, warm Skin: See below - pleurx site with minimal eschar near catheter exit slight. Some tenderness but only raised area is exactly where cuff should be and feels like cuff in appropriate position. No induration, purulence, or erythema suggestive of infection or dermatitis.  Psych: Appropriate affect Neuro: Alert and oriented to person and place, no focal deficit       There were no vitals filed for this visit.    on 2 LPM  BMI Readings from Last 3 Encounters:   06/19/22 27.74 kg/m  06/16/22 28.04 kg/m  06/03/22 27.69 kg/m   Wt Readings from Last 3 Encounters:  06/19/22 142 lb 1 oz (64.4 kg)  06/16/22 143 lb 9.6 oz (65.1 kg)  06/03/22 141 lb 12.8 oz (64.3 kg)  CBC    Component Value Date/Time   WBC 6.4 06/17/2022 1314   WBC 6.5 10/30/2021 1428   RBC 3.34 (L) 06/17/2022 1314   HGB 10.2 (L) 06/17/2022 1314   HCT 32.9 (L) 06/17/2022 1314   PLT 264 06/17/2022 1314   MCV 98.5 06/17/2022 1314   MCH 30.5 06/17/2022 1314   MCHC 31.0 06/17/2022 1314   RDW 13.0 06/17/2022 1314   LYMPHSABS 0.7 06/17/2022 1314   MONOABS 0.8 06/17/2022 1314   EOSABS 0.1 06/17/2022 1314   BASOSABS 0.0 06/17/2022 1314     Chest Imaging: CT Chest 06/18/22: stabel 74mm LUL nodule, slight decrease in size chroinc right effusion  Pulmonary Functions Testing Results:    Latest Ref Rng & Units 03/08/2018   11:01 AM  PFT Results  FVC-Pre L 2.00   FVC-Predicted Pre % 104   FVC-Post L 1.91   FVC-Predicted Post % 99   Pre FEV1/FVC % % 73   Post FEV1/FCV % % 74   FEV1-Pre L 1.47   FEV1-Predicted Pre % 99   FEV1-Post L 1.42   TLC L 2.74   TLC % Predicted % 65   RV % Predicted % 61      Echocardiogram 2022:   1. Left ventricular ejection fraction, by estimation, is 40 to 45%. The  left ventricle has mildly decreased function. The left ventricle  demonstrates global hypokinesis. The left ventricular internal cavity size  was mildly dilated. Left ventricular  diastolic parameters are consistent with Grade I diastolic dysfunction  (impaired relaxation).   2. Right ventricular systolic function is moderately reduced. The right  ventricular size is normal. There is normal pulmonary artery systolic  pressure.   3. There is a moderate circumferential pericardial effusion. There is no  collapse of the RV or RA. The MV inflow pattern is normal. There is no  evidence of pericardial tamponade but I would recommend close clinical  follow up for further  evaluation . Marland Kitchen  Moderate pericardial effusion. The pericardial effusion is  circumferential. There is no evidence of cardiac tamponade.   4. The mitral valve is grossly normal. No evidence of mitral valve  regurgitation.   5. The aortic valve is grossly normal. Aortic valve regurgitation is  moderate.   6. The IVC is normal size. There is inspiratory collapse of the IVC. Marland Kitchen  The inferior vena cava is normal in size with greater than 50% respiratory  variability, suggesting right atrial pressure of 3 mmHg.    Assessment & Plan:   # Stage IIIA NSCLC c/b disease progression with brain mets 2020 s/p chemo, immunotherapy, RT now on observation # Recurrent right pleural effusion s/p R pleurx 03/11/22 # Chronic hypoxic respiratory failure on 2L with exertion and sleep  Plan: - If your cousin notices redness or pus coming out from around tunnel site of catheter or if you start to develop fever, chest pain then please call our clinic [phone] - try lidocaine patch just behind your pleurx dressing to see if this helps with pain near catheter entry site. - will keep eye out for results of surveillance CT CAP tomorrow - see you in 4 months or sooner if need be!  [facility] Instructions for you: If draining >=39ml, every other day as you are If draining 200-300 ml, drain every 3 days If draining 100-200 ml, drain every 4 days When you drain 50 ml three consecutive times in a row, notify your doctor so we can check a chest  x-ray and possibly remove the catheter. Please keep a log and bring it with you to your clinic appointment.     Maryjane Hurter, MD [facility] 07/27/2022 2:31 PM

## 2022-07-28 ENCOUNTER — Encounter: Payer: Self-pay | Admitting: Student

## 2022-07-28 ENCOUNTER — Ambulatory Visit: Payer: Medicare PPO | Admitting: Student

## 2022-07-28 VITALS — BP 122/78 | HR 106 | Temp 98.4°F | Ht 60.0 in | Wt 148.0 lb

## 2022-07-28 DIAGNOSIS — R0609 Other forms of dyspnea: Secondary | ICD-10-CM

## 2022-07-28 DIAGNOSIS — J90 Pleural effusion, not elsewhere classified: Secondary | ICD-10-CM

## 2022-07-28 NOTE — Patient Instructions (Addendum)
-   Look for aquaguard dressing to place over your pleurx when you shower - keep doing what you're doing with the catheter - PleurX is standard of care for treating recurrent pleural effusion with nonexpandable lung. But it does appear that the fluid on your right side probably accumulates pretty slowly. If you decide you'd like to try to take out the current pleurx catheter, that is ok but there is chance that the fluid will reaccumulate and further compress your right lung, causing more trouble breathing than you currently have. We would plan to replace pleurx if that was the case.  - see you in July or sooner if need be!

## 2022-08-13 DIAGNOSIS — J90 Pleural effusion, not elsewhere classified: Secondary | ICD-10-CM | POA: Diagnosis not present

## 2022-08-13 DIAGNOSIS — C7800 Secondary malignant neoplasm of unspecified lung: Secondary | ICD-10-CM | POA: Diagnosis not present

## 2022-08-26 DIAGNOSIS — C3490 Malignant neoplasm of unspecified part of unspecified bronchus or lung: Secondary | ICD-10-CM | POA: Diagnosis not present

## 2022-09-23 ENCOUNTER — Ambulatory Visit
Admission: RE | Admit: 2022-09-23 | Discharge: 2022-09-23 | Disposition: A | Discharge status: Home / Self Care | Payer: Medicare PPO | Source: Ambulatory Visit | Attending: Radiation Oncology | Admitting: Radiation Oncology

## 2022-09-23 DIAGNOSIS — C7931 Secondary malignant neoplasm of brain: Secondary | ICD-10-CM | POA: Diagnosis not present

## 2022-09-23 MED ORDER — GADOPICLENOL 0.5 MMOL/ML IV SOLN
6.0000 mL | Freq: Once | INTRAVENOUS | Status: AC | PRN
  Administered 2022-09-23: 6 mL via INTRAVENOUS

## 2022-09-25 DIAGNOSIS — C3490 Malignant neoplasm of unspecified part of unspecified bronchus or lung: Secondary | ICD-10-CM | POA: Diagnosis not present

## 2022-09-26 ENCOUNTER — Encounter: Payer: Self-pay | Admitting: Radiation Oncology

## 2022-09-26 NOTE — Progress Notes (Signed)
Telephone nursing appointment for patient to review. I verified patient's identity x2 and began nursing interview. Patient reports an occasional, mild headache 1/10. No other issues conveyed at this time.   Meaningful use complete.   Patient aware of their 3:30pm-09/29/2022 telephone appointment w/ Laurence Aly PA-C. I left my extension [phone] in case patient needs anything. Patient verbalized understanding. This concludes the nursing interview.   Patient contact [phone]     Ruel Favors, LPN

## 2022-09-29 ENCOUNTER — Ambulatory Visit
Admission: RE | Admit: 2022-09-29 | Discharge: 2022-09-29 | Disposition: A | Discharge status: Home / Self Care | Payer: Medicare PPO | Source: Ambulatory Visit | Attending: Radiation Oncology | Admitting: Radiation Oncology

## 2022-09-29 VITALS — Ht 60.0 in | Wt 145.0 lb

## 2022-09-29 DIAGNOSIS — C3431 Malignant neoplasm of lower lobe, right bronchus or lung: Secondary | ICD-10-CM | POA: Diagnosis not present

## 2022-09-29 DIAGNOSIS — C7931 Secondary malignant neoplasm of brain: Secondary | ICD-10-CM

## 2022-09-29 NOTE — Progress Notes (Signed)
Radiation Oncology         [phone] ________________________________  Outpatient Follow Up - Conducted via telephone at patient request.  I spoke with the patient to conduct this visit via telephone. The patient was notified in advance and was offered an in person or telemedicine meeting to allow for face to face communication but instead preferred to proceed with a telephone visit.   Name: Susan Holder MRN: [id number]  Date of Service: 09/29/2022  DOB: Sep 24, 1955   DIAGNOSIS:   Progressive Metastatic Stage IIIA, cT3N1-2M0 NSCLC, adenocarcinoma of the right lower lobe with brain metastases   INTERVAL SINCE LAST RADIOTHERAPY:   03/09/2019 SRS Treatment: Each site was treated to 20 Gy in a single fraction PTV1 Post Cerebellum 12mm PTV2 Lt Cerebellum 13mm PTV3 Lo Rt Vent 8mm PTV4 Rt Frontal 5mm PTV5 Lt Frontal Operc 5mm PTV6 Lt Sylv 5mm  11/16/2018-12/20/2018: The patient's right supraclavicular nodes were treated to 60 Gy over 25 fractions.   11/30/17-01/11/18:  66 Gy to the right chest and regional nodes over 33 fractions  NARRATIVE: Susan Holder is a well-known 67 y.o.  patient to our service with a history of adenocarcinoma of the right lower lobe.  When she was originally diagnosed in the summer 2019 she presented with local regional disease and received chemoradiation.  Unfortunately she recurred in the summer 2020, she did receive palliative radiotherapy to the right supraclavicular node, and in November 2020 was found to have 6 lesions in the brain felt to be metastatic for which she underwent SRS treatment in November 2020.    She has been followed with no evidence of disease in the brain.  Her last immunotherapy was given on 09/04/2021. Recent imaging of the chest shows concerns for inflammatory changes in the left lung base, she is going to be undergoing biopsy of this with Dr. Tonia Brooms.  She also has a right effusion that will be treated with Pleurx catheter.  Her recent MRI  on 02/26/2022 showed mildly increased edema surrounding the right frontal lobe lesion currently measuring 5 mm additional lesions appeared slightly increased but in discussion in brain oncology conference it was felt that these changes were all consistent with inflammatory change.  Interestingly she had a 5 mm area of posttreatment effect in the left cerebellum on her scan in May 2023 but she did have changes in dizziness and vertigo and presented in July to the emergency department at that time a 1.5 Tesla MRI was performed and the lesion in the left cerebellum measured 7 mm.  Again we discussed the limitations but her symptoms have resolved without intervention.  She is contacted today about the results of her MRI from 02/26/2022.  On review of systems, the patient reports that she is doing fairly well. She reports she reports she is still emptying her pleurex catheter but Dr. Arbutus Ped suggested emptying this less often, at twice a week. She is still quite fatigued and reports that she has had a few episodes of dizziness. This seems to be more noticeable when she is going up or down the stairs.  She denies any progressive headaches, changes in behavior, cognitive function, or movement. No other complaints are verbalized.   PAST MEDICAL HISTORY:  Past Medical History:  Diagnosis Date   Acid reflux 09/21/2018   Adenocarcinoma of right lung, stage 4 (HCC) 11/17/2017   ADHD (attention deficit hyperactivity disorder)    patient denies this dx as of 03/10/22   Allergic rhinitis 08/22/2013   ANA positive  05/29/2016   Anemia    Aortic atherosclerosis (HCC) 10/23/2020   Brain metastases 03/29/2019   COPD (chronic obstructive pulmonary disease) (HCC)    Coronary artery calcification seen on CT scan 10/23/2020   patient is unaware of this dx as of 03/10/22   DDD (degenerative disc disease), cervical 05/29/2016   patient denies this dx as of 03/10/22   Dyspnea    Encounter for antineoplastic chemotherapy  11/17/2017   Encounter for antineoplastic immunotherapy 02/01/2018   Goals of care, counseling/discussion 11/17/2017   High risk medication use 05/29/2016   04/29/2016: ==> plq 200 am & 100 qhs(adeq response).   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   Other fatigue 05/29/2016   Pericardial effusion 10/23/2020   Pneumonia    as a child   Primary malignant neoplasm of bronchus of right lower lobe (HCC) 12/21/2017   Rheumatoid arthritis (HCC)    Rheumatoid arthritis involving multiple sites with positive rheumatoid factor (HCC) 05/29/2016   +RF +ANA +CCP    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   Smoker 05/29/2016   quit 2019   Trigger finger, left ring finger 05/29/2016   Trigger finger, right ring finger 05/29/2016   Vitamin D deficiency 05/29/2016    PAST SURGICAL HISTORY: Past Surgical History:  Procedure Laterality Date   BRONCHIAL BIOPSY  03/11/2022   Procedure: BRONCHIAL BIOPSIES;  Surgeon: Omar Person, MD;  Location: [facility] ENDOSCOPY;  Service: Pulmonary;;   BRONCHIAL NEEDLE ASPIRATION BIOPSY  11/09/2017   Procedure: BRONCHIAL NEEDLE ASPIRATION BIOPSIES;  Surgeon: Chilton Greathouse, MD;  Location: [facility] ENDOSCOPY;  Service: Cardiopulmonary;;   BRONCHIAL WASHINGS  03/11/2022   Procedure: BRONCHIAL WASHINGS;  Surgeon: Omar Person, MD;  Location: [facility] ENDOSCOPY;  Service: Pulmonary;;   CHEST TUBE INSERTION Left 05/13/2021   Procedure: INSERTION PLEURAL DRAINAGE CATHETER;  Surgeon: Corliss Skains, MD;  Location: [facility] OR;  Service: Thoracic;  Laterality: Left;   CHEST TUBE INSERTION Right 03/11/2022   Procedure: INSERTION PLEURAL DRAINAGE CATHETER;  Surgeon: Omar Person, MD;  Location: [facility] ENDOSCOPY;  Service: Pulmonary;  Laterality: Right;   indwelling pleural catheter, w/ cuff   ENDOBRONCHIAL ULTRASOUND Bilateral 11/09/2017   Procedure: ENDOBRONCHIAL ULTRASOUND;  Surgeon: Chilton Greathouse, MD;  Location: [facility] ENDOSCOPY;  Service: Cardiopulmonary;  Laterality: Bilateral;   NASAL SINUS SURGERY     NASAL SINUS SURGERY     THORACENTESIS Right 02/07/2021   Procedure: THORACENTESIS;  Surgeon: Corliss Skains, MD;  Location: [facility] OR;  Service: Thoracic;  Laterality: Right;   TUBAL LIGATION     VIDEO BRONCHOSCOPY  11/09/2017   Procedure: VIDEO BRONCHOSCOPY;  Surgeon: Chilton Greathouse, MD;  Location: [facility] ENDOSCOPY;  Service: Cardiopulmonary;;   VIDEO BRONCHOSCOPY Left 03/11/2022   Procedure: VIDEO BRONCHOSCOPY WITH FLUORO;  Surgeon: Omar Person, MD;  Location: [facility] ENDOSCOPY;  Service: Pulmonary;  Laterality: Left;   XI ROBOTIC ASSISTED PERICARDIAL WINDOW Left 02/07/2021   Procedure: XI ROBOTIC ASSISTED THORACOSCOPY PERICARDIAL WINDOW;  Surgeon: Corliss Skains, MD;  Location: [facility] OR;  Service: Thoracic;  Laterality: Left;    PAST SOCIAL HISTORY:  Social History   Socioeconomic History   Marital status: Divorced    Spouse name: Not on file   Number of children:  1   Years of education: Not on file   Highest education level: Not on file  Occupational History   Not on file  Tobacco Use   Smoking status: Former    Packs/day: 0.50    Years: 45.00    Additional pack years: 0.00    Total pack years: 22.50    Types: Cigarettes    Quit date: 09/29/2017    Years since quitting: 5.0   Smokeless tobacco: Never  Vaping Use   Vaping Use: Never used  Substance and Sexual Activity   Alcohol use: Not Currently   Drug use: No   Sexual activity: Not Currently    Birth control/protection: Post-menopausal  Other Topics Concern   Not on file  Social History Narrative   Not on file   Social Determinants of Health   Financial Resource Strain: Low Risk  (12/03/2021)   Overall Financial Resource Strain (CARDIA)    Difficulty of  Paying Living Expenses: Not hard at all  Food Insecurity: No Food Insecurity (11/06/2020)   Hunger Vital Sign    Worried About Running Out of Food in the Last Year: Never true    Ran Out of Food in the Last Year: Never true  Transportation Needs: No Transportation Needs (12/03/2021)   PRAPARE - Administrator, Civil Service (Medical): No    Lack of Transportation (Non-Medical): No  Physical Activity: Inactive (12/03/2021)   Exercise Vital Sign    Days of Exercise per Week: 0 days    Minutes of Exercise per Session: 0 min  Stress: No Stress Concern Present (12/03/2021)   [employer] of Occupational Health - Occupational Stress Questionnaire    Feeling of Stress : Not at all  Social Connections: Unknown (12/03/2021)   Social Connection and Isolation Panel [NHANES]    Frequency of Communication with Friends and Family: More than three times a week    Frequency of Social Gatherings with Friends and Family: Never    Attends Religious Services: More than 4 times per year    Active Member of [employer] or Organizations: Yes    Attends Banker Meetings: More than 4 times per year    Marital Status: Not on file  Intimate Partner Violence: Not At Risk (12/03/2021)   Humiliation, Afraid, Rape, and Kick questionnaire    Fear of Current or Ex-Partner: No    Emotionally Abused: No    Physically Abused: No    Sexually Abused: No  The patient is divorced.  She lives in [place] and her 60 year old granddaughter is also at home with her.  She is a retired Production designer, theatre/television/film for a [employer].  PAST FAMILY HISTORY: Family History  Problem Relation Age of Onset   Stroke Mother    Alzheimer's disease Mother    Heart disease Mother    Emphysema Father    Hypertension Brother    Heart attack Maternal Aunt    Heart failure Maternal Grandmother    Hypertension Paternal Grandmother     MEDICATIONS  Current Outpatient Medications  Medication Sig Dispense Refill   acetaminophen  (TYLENOL) 325 MG tablet Take 2 tablets (650 mg total) by mouth every 6 (six) hours. (Patient taking differently: Take 650 mg by mouth every 6 (six) hours as needed for moderate pain.)     Ascorbic Acid (VITAMIN C) 250 MG CHEW Chew 500 mg by mouth daily. Gummy     Cholecalciferol (VITAMIN D-3) 125 MCG (5000 UT) TABS Take 5,000 Units by mouth  daily.     fluticasone (FLONASE) 50 MCG/ACT nasal spray Place 2 sprays into both nostrils daily. (Patient taking differently: Place 2 sprays into both nostrils daily as needed for allergies or rhinitis.) 16 g 6   hydroxychloroquine (PLAQUENIL) 200 MG tablet TAKE 1 TABLET BY MOUTH TWICE DAILY MONDAY- FRIDAY ONLY 120 tablet 0   lidocaine 4 % Place 1 patch onto the skin daily. 30 patch 11   omeprazole (PRILOSEC) 20 MG capsule Take 20 mg by mouth daily.     prochlorperazine (COMPAZINE) 10 MG tablet TAKE 1 TABLET(10 MG) BY MOUTH EVERY 6 HOURS AS NEEDED FOR NAUSEA OR VOMITING 30 tablet 2   UNABLE TO FIND Med Name  Amoclav  25 mg   twice a day for infection (ulcer) in mouth     No current facility-administered medications for this encounter.    ALLERGIES: No Known Allergies  PHYSICAL EXAM:  Unable to assess due to encounter type.   IMPRESSION/PLAN: 1. Progressive Metastatic Stage IIIA, cT3N1-2M0 NSCLC, adenocarcinoma of the right lower lobe with brain metastases. The patient's scans were discussed in brain oncology conference, and it is favored that these changes remain stable though her edema at the left cerebellum and right frontal region remain persistent. With the waxing and waning, we will plan repeat MRI in 3 months time. Her symptoms are difficult to estimate in frequency or intensity but she will let us know if there are any changes in her function. She will follow up with Dr. Arbutus Ped as well. I will check with Dr. Mitzi Hansen to see if he thinks steroids would be of beneficial. 2. Pleural effusion.  She will follow up with Dr. Thora Lance and we will follow this  expectantly.    This encounter was conducted via telephone.  The patient has provided two factor identification and has given verbal consent for this type of encounter and has been advised to only accept a meeting of this type in a secure network environment. The time spent during this encounter was 35 minutes including preparation, discussion, and coordination of the patient's care. The attendants for this meeting include Laurence Aly, [facility] and [name].  During the encounter, Laurence Aly [facility] was located remotely. Susan Holder was located remotely at home.    Osker Mason, PAC

## 2022-09-30 ENCOUNTER — Other Ambulatory Visit: Payer: Self-pay | Admitting: Radiation Therapy

## 2022-09-30 ENCOUNTER — Telehealth: Payer: Self-pay | Admitting: Radiation Oncology

## 2022-09-30 DIAGNOSIS — C7931 Secondary malignant neoplasm of brain: Secondary | ICD-10-CM

## 2022-09-30 NOTE — Telephone Encounter (Signed)
I called the patient to let her know that Dr. Mitzi Hansen felt like should follow her imaging results of edema with another MRI, but did not think steroids would be needed at this. She is in agreement to keep [place] informed. She is hoping to increase her physical endurance, and lung function. She feels like she only needs O2 when she's up and moving. I encouraged her to ask Dr. Thora Lance if there is any rehab that would help her as well.

## 2022-10-01 ENCOUNTER — Other Ambulatory Visit: Payer: Self-pay

## 2022-10-03 DIAGNOSIS — C7800 Secondary malignant neoplasm of unspecified lung: Secondary | ICD-10-CM | POA: Diagnosis not present

## 2022-10-03 DIAGNOSIS — J90 Pleural effusion, not elsewhere classified: Secondary | ICD-10-CM | POA: Diagnosis not present

## 2022-10-14 ENCOUNTER — Ambulatory Visit (HOSPITAL_COMMUNITY)
Admission: RE | Admit: 2022-10-14 | Discharge: 2022-10-14 | Disposition: A | Discharge status: Home / Self Care | Payer: Medicare PPO | Source: Ambulatory Visit | Attending: Internal Medicine | Admitting: Internal Medicine

## 2022-10-14 ENCOUNTER — Other Ambulatory Visit: Payer: Self-pay

## 2022-10-14 ENCOUNTER — Inpatient Hospital Stay: Payer: Medicare PPO | Attending: Physician Assistant

## 2022-10-14 DIAGNOSIS — C3431 Malignant neoplasm of lower lobe, right bronchus or lung: Secondary | ICD-10-CM | POA: Insufficient documentation

## 2022-10-14 DIAGNOSIS — C3490 Malignant neoplasm of unspecified part of unspecified bronchus or lung: Secondary | ICD-10-CM | POA: Insufficient documentation

## 2022-10-14 DIAGNOSIS — J90 Pleural effusion, not elsewhere classified: Secondary | ICD-10-CM | POA: Insufficient documentation

## 2022-10-14 DIAGNOSIS — D509 Iron deficiency anemia, unspecified: Secondary | ICD-10-CM | POA: Insufficient documentation

## 2022-10-14 DIAGNOSIS — Z9221 Personal history of antineoplastic chemotherapy: Secondary | ICD-10-CM | POA: Insufficient documentation

## 2022-10-14 DIAGNOSIS — Z923 Personal history of irradiation: Secondary | ICD-10-CM | POA: Insufficient documentation

## 2022-10-14 DIAGNOSIS — Z79899 Other long term (current) drug therapy: Secondary | ICD-10-CM | POA: Insufficient documentation

## 2022-10-14 DIAGNOSIS — Z87891 Personal history of nicotine dependence: Secondary | ICD-10-CM | POA: Insufficient documentation

## 2022-10-14 DIAGNOSIS — N281 Cyst of kidney, acquired: Secondary | ICD-10-CM | POA: Diagnosis not present

## 2022-10-14 DIAGNOSIS — J439 Emphysema, unspecified: Secondary | ICD-10-CM | POA: Diagnosis not present

## 2022-10-14 DIAGNOSIS — C7931 Secondary malignant neoplasm of brain: Secondary | ICD-10-CM | POA: Insufficient documentation

## 2022-10-14 LAB — CBC WITH DIFFERENTIAL (CANCER CENTER ONLY)
Abs Immature Granulocytes: 0.03 10*3/uL (ref 0.00–0.07)
Basophils Absolute: 0 10*3/uL (ref 0.0–0.1)
Basophils Relative: 0 %
Eosinophils Absolute: 0.1 10*3/uL (ref 0.0–0.5)
Eosinophils Relative: 1 %
HCT: 35.3 % — ABNORMAL LOW (ref 36.0–46.0)
Hemoglobin: 10.6 g/dL — ABNORMAL LOW (ref 12.0–15.0)
Immature Granulocytes: 0 %
Lymphocytes Relative: 11 %
Lymphs Abs: 0.8 10*3/uL (ref 0.7–4.0)
MCH: 28.7 pg (ref 26.0–34.0)
MCHC: 30 g/dL (ref 30.0–36.0)
MCV: 95.7 fL (ref 80.0–100.0)
Monocytes Absolute: 1 10*3/uL (ref 0.1–1.0)
Monocytes Relative: 13 %
Neutro Abs: 5.8 10*3/uL (ref 1.7–7.7)
Neutrophils Relative %: 75 %
Platelet Count: 289 10*3/uL (ref 150–400)
RBC: 3.69 MIL/uL — ABNORMAL LOW (ref 3.87–5.11)
RDW: 13.3 % (ref 11.5–15.5)
WBC Count: 7.7 10*3/uL (ref 4.0–10.5)
nRBC: 0 % (ref 0.0–0.2)

## 2022-10-14 LAB — CMP (CANCER CENTER ONLY)
ALT: 8 U/L (ref 0–44)
AST: 18 U/L (ref 15–41)
Albumin: 3.5 g/dL (ref 3.5–5.0)
Alkaline Phosphatase: 104 U/L (ref 38–126)
Anion gap: 6 (ref 5–15)
BUN: 22 mg/dL (ref 8–23)
CO2: 28 mmol/L (ref 22–32)
Calcium: 10.1 mg/dL (ref 8.9–10.3)
Chloride: 105 mmol/L (ref 98–111)
Creatinine: 1.23 mg/dL — ABNORMAL HIGH (ref 0.44–1.00)
GFR, Estimated: 48 mL/min — ABNORMAL LOW (ref >60)
Glucose, Bld: 88 mg/dL (ref 70–99)
Potassium: 4.6 mmol/L (ref 3.5–5.1)
Sodium: 139 mmol/L (ref 135–145)
Total Bilirubin: 0.2 mg/dL — ABNORMAL LOW (ref 0.3–1.2)
Total Protein: 7.4 g/dL (ref 6.5–8.1)

## 2022-10-14 MED ORDER — IOHEXOL 300 MG/ML  SOLN
100.0000 mL | Freq: Once | INTRAMUSCULAR | Status: AC | PRN
  Administered 2022-10-14: 100 mL via INTRAVENOUS

## 2022-10-16 ENCOUNTER — Other Ambulatory Visit: Payer: Self-pay

## 2022-10-16 ENCOUNTER — Inpatient Hospital Stay: Payer: Medicare PPO | Admitting: Internal Medicine

## 2022-10-16 VITALS — BP 120/64 | HR 95 | Temp 97.3°F | Resp 17 | Ht 60.0 in | Wt 151.9 lb

## 2022-10-16 DIAGNOSIS — C7931 Secondary malignant neoplasm of brain: Secondary | ICD-10-CM | POA: Diagnosis not present

## 2022-10-16 DIAGNOSIS — C3490 Malignant neoplasm of unspecified part of unspecified bronchus or lung: Secondary | ICD-10-CM

## 2022-10-16 DIAGNOSIS — C3431 Malignant neoplasm of lower lobe, right bronchus or lung: Secondary | ICD-10-CM | POA: Diagnosis not present

## 2022-10-16 DIAGNOSIS — D509 Iron deficiency anemia, unspecified: Secondary | ICD-10-CM | POA: Diagnosis not present

## 2022-10-16 DIAGNOSIS — Z9221 Personal history of antineoplastic chemotherapy: Secondary | ICD-10-CM | POA: Diagnosis not present

## 2022-10-16 DIAGNOSIS — Z79899 Other long term (current) drug therapy: Secondary | ICD-10-CM | POA: Diagnosis not present

## 2022-10-16 DIAGNOSIS — Z87891 Personal history of nicotine dependence: Secondary | ICD-10-CM | POA: Diagnosis not present

## 2022-10-16 DIAGNOSIS — J90 Pleural effusion, not elsewhere classified: Secondary | ICD-10-CM | POA: Diagnosis not present

## 2022-10-16 DIAGNOSIS — Z923 Personal history of irradiation: Secondary | ICD-10-CM | POA: Diagnosis not present

## 2022-10-16 NOTE — Progress Notes (Signed)
[facility] [phone]   Fax:([phone]  OFFICE PROGRESS NOTE  [name], MD [address] [place] [address]  DIAGNOSIS: Metastatic non-small cell lung cancer initially diagnosed as stage IIIA (T3, N1/N2, M0) non-small cell lung cancer, adenocarcinoma presented with large right lower lobe lung mass with extension to the right hilum and subcarinal area diagnosed in July 2019.  She has brain metastasis in October 2020.   Biomarker Findings Tumor Mutational Burden - TMB-Intermediate (6 Muts/Mb) Microsatellite status - MS-Stable Genomic Findings For a complete list of the genes assayed, please refer to the Appendix. NRAS Q61R ARAF amplification STK11 G56W KRAS G13D MYCN amplification MCL1 amplification NKX2-1 amplification - equivocal? TP53 G245V 7 Disease relevant genes with no reportable alterations: EGFR, ALK, BRAF, MET, ERBB2, RET, ROS1    PRIOR THERAPY: 1) Course of concurrent chemoradiation with weekly carboplatin for AUC of 2 and paclitaxel 45 mg/M2.  Status post 7 cycles.  Last dose was giving 01/11/2018. 2) Consolidation treatment with immunotherapy with Imfinzi (Durvalumab) 10 mg/KG every 2 weeks.  First dose February 09, 2018.  Status post 19 cycles. 3) status post stereotactic body radiotherapy to the enlarging right supraclavicular lymphadenopathy under the care of Dr. Mitzi Hansen. 4) SRS to multiple brain metastasis under the care of Dr. Mitzi Hansen. 5) Systemic chemotherapy with carboplatin for AUC of 5, Alimta 500 mg/M2 and Keytruda 200 mg IV every 3 weeks.  First dose 08/16/2019.  Status post 35 cycles.  Starting from cycle #5 the patient is on maintenance treatment with Alimta and Keytruda every 3 weeks. Alimta was reduced to 400 mg/m2 starting from cycle #21.  Starting from cycle #30 her treatment was changed to single agent Keytruda every 3 weeks.  Alimta was discontinued after Sep 04, 2021 secondary to toxicity.    CURRENT THERAPY: Observation.   INTERVAL HISTORY: Susan Holder 67 y.o. female returns to the clinic today for follow-up visit.  The patient is feeling fine today with no concerning complaints except for the mild fatigue and baseline shortness of breath and she is currently on home oxygen.  She denied having any current chest pain, cough or hemoptysis.  She has no nausea, vomiting, diarrhea or constipation.  She has no headache or visual changes.  She denied having any fever or chills.  She had no recent weight loss or night sweats.  She continues to have drainage of the right pleural effusion around 200-300 mL few times a week.  She is here today for evaluation with repeat CT scan of the chest, abdomen and pelvis for restaging of her disease.   MEDICAL HISTORY: Past Medical History:  Diagnosis Date   Acid reflux 09/21/2018   Adenocarcinoma of right lung, stage 4 (HCC) 11/17/2017   ADHD (attention deficit hyperactivity disorder)    patient denies this dx as of 03/10/22   Allergic rhinitis 08/22/2013   ANA positive 05/29/2016   Anemia    Aortic atherosclerosis (HCC) 10/23/2020   Brain metastases 03/29/2019   COPD (chronic obstructive pulmonary disease) (HCC)    Coronary artery calcification seen on CT scan 10/23/2020   patient is unaware of this dx as of 03/10/22   DDD (degenerative disc disease), cervical 05/29/2016   patient denies this dx as of 03/10/22   Dyspnea    Encounter for antineoplastic chemotherapy 11/17/2017   Encounter for antineoplastic immunotherapy 02/01/2018   Goals of care, counseling/discussion 11/17/2017   High risk medication use 05/29/2016   04/29/2016: ==>  plq 200 am & 100 qhs(adeq response).   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  MEDICATIONS:  Current Outpatient Medications  Medication Sig Dispense Refill   acetaminophen (TYLENOL) 325 MG tablet Take 2 tablets (650 mg total) by mouth every 6 (six) hours. (Patient taking differently: Take 650 mg by mouth every 6 (six) hours as needed for moderate pain.)     Ascorbic Acid (VITAMIN C) 250 MG CHEW Chew 500 mg by mouth daily. Gummy     Cholecalciferol (VITAMIN D-3) 125 MCG (5000 UT) TABS Take 5,000 Units by mouth daily.     fluticasone (FLONASE) 50 MCG/ACT nasal spray Place 2 sprays into both nostrils daily. (Patient taking differently: Place 2 sprays into both nostrils daily as needed for allergies or rhinitis.) 16 g 6   hydroxychloroquine (PLAQUENIL) 200 MG tablet TAKE 1 TABLET BY MOUTH TWICE DAILY MONDAY- FRIDAY ONLY 120 tablet 0   lidocaine 4 % Place 1 patch onto the skin daily. 30 patch 11   omeprazole (PRILOSEC) 20 MG capsule Take 20 mg by mouth daily.     prochlorperazine (COMPAZINE) 10 MG tablet TAKE 1 TABLET(10 MG) BY MOUTH EVERY 6 HOURS AS NEEDED FOR NAUSEA OR VOMITING 30 tablet 2   UNABLE TO FIND Med Name  Amoclav  25 mg   twice a day for infection (ulcer) in mouth     No current facility-administered medications for this visit.    SURGICAL HISTORY:  Past Surgical History:  Procedure Laterality Date   BRONCHIAL BIOPSY  03/11/2022   Procedure: BRONCHIAL BIOPSIES;  Surgeon: Omar Person, MD;  Location: [facility]  ENDOSCOPY;  Service: Pulmonary;;   BRONCHIAL NEEDLE ASPIRATION BIOPSY  11/09/2017   Procedure: BRONCHIAL NEEDLE ASPIRATION BIOPSIES;  Surgeon: Chilton Greathouse, MD;  Location: [facility] ENDOSCOPY;  Service: Cardiopulmonary;;   BRONCHIAL WASHINGS  03/11/2022   Procedure: BRONCHIAL WASHINGS;  Surgeon: Omar Person, MD;  Location: [facility] ENDOSCOPY;  Service: Pulmonary;;   CHEST TUBE INSERTION Left 05/13/2021   Procedure: INSERTION PLEURAL DRAINAGE CATHETER;  Surgeon: Corliss Skains, MD;  Location: [facility] OR;  Service: Thoracic;  Laterality: Left;   CHEST TUBE INSERTION Right 03/11/2022   Procedure: INSERTION PLEURAL DRAINAGE CATHETER;  Surgeon: Omar Person, MD;  Location: [facility] ENDOSCOPY;  Service: Pulmonary;  Laterality: Right;  indwelling pleural catheter, w/ cuff   ENDOBRONCHIAL ULTRASOUND Bilateral 11/09/2017   Procedure: ENDOBRONCHIAL ULTRASOUND;  Surgeon: Chilton Greathouse, MD;  Location: [facility] ENDOSCOPY;  Service: Cardiopulmonary;  Laterality: Bilateral;   NASAL SINUS SURGERY     NASAL SINUS SURGERY     THORACENTESIS Right 02/07/2021   Procedure: THORACENTESIS;  Surgeon:  Corliss Skains, MD;  Location: [facility] OR;  Service: Thoracic;  Laterality: Right;   TUBAL LIGATION     VIDEO BRONCHOSCOPY  11/09/2017   Procedure: VIDEO BRONCHOSCOPY;  Surgeon: Chilton Greathouse, MD;  Location: [facility] ENDOSCOPY;  Service: Cardiopulmonary;;   VIDEO BRONCHOSCOPY Left 03/11/2022   Procedure: VIDEO BRONCHOSCOPY WITH FLUORO;  Surgeon: Omar Person, MD;  Location: [facility] ENDOSCOPY;  Service: Pulmonary;  Laterality: Left;   XI ROBOTIC ASSISTED PERICARDIAL WINDOW Left 02/07/2021   Procedure: XI ROBOTIC ASSISTED THORACOSCOPY PERICARDIAL WINDOW;  Surgeon: Corliss Skains, MD;  Location: [facility] OR;  Service: Thoracic;  Laterality: Left;    REVIEW OF SYSTEMS:  Constitutional: positive for fatigue Eyes: negative Ears, nose, mouth, throat, and face: negative Respiratory: positive for dyspnea on exertion Cardiovascular:  negative Gastrointestinal: negative Genitourinary:negative Integument/breast: negative Hematologic/lymphatic: negative Musculoskeletal:negative Neurological: negative Behavioral/Psych: negative Endocrine: negative Allergic/Immunologic: negative   PHYSICAL EXAMINATION: General appearance: alert, cooperative, fatigued, and no distress Head: Normocephalic, without obvious abnormality, atraumatic Neck: no adenopathy, no JVD, supple, symmetrical, trachea midline, and thyroid not enlarged, symmetric, no tenderness/mass/nodules Lymph nodes: Cervical, supraclavicular, and axillary nodes normal. Resp: diminished breath sounds RLL and dullness to percussion RLL Back: symmetric, no curvature. ROM normal. No CVA tenderness. Cardio: regular rate and rhythm, S1, S2 normal, no murmur, click, rub or gallop GI: soft, non-tender; bowel sounds normal; no masses,  no organomegaly Extremities: extremities normal, atraumatic, no cyanosis or edema Neurologic: Alert and oriented X 3, normal strength and tone. Normal symmetric reflexes. Normal coordination and gait  ECOG PERFORMANCE STATUS: 1 - Symptomatic but completely ambulatory  Blood pressure 120/64, pulse 95, temperature (!) 97.3 F (36.3 C), temperature source Temporal, resp. rate 17, height 5' (1.524 m), weight 151 lb 14.4 oz (68.9 kg), last menstrual period 04/28/2000, SpO2 100 %.  LABORATORY DATA: Lab Results  Component Value Date   WBC 7.7 10/14/2022   HGB 10.6 (L) 10/14/2022   HCT 35.3 (L) 10/14/2022   MCV 95.7 10/14/2022   PLT 289 10/14/2022      Chemistry      Component Value Date/Time   NA 139 10/14/2022 1207   K 4.6 10/14/2022 1207   CL 105 10/14/2022 1207   CO2 28 10/14/2022 1207   BUN 22 10/14/2022 1207   CREATININE 1.23 (H) 10/14/2022 1207   CREATININE 0.80 07/24/2017 1438      Component Value Date/Time   CALCIUM 10.1 10/14/2022 1207   ALKPHOS 104 10/14/2022 1207   AST 18 10/14/2022 1207   ALT 8 10/14/2022 1207    BILITOT 0.2 (L) 10/14/2022 1207       RADIOGRAPHIC STUDIES: CT Abdomen Pelvis W Contrast  Result Date: 10/16/2022 CLINICAL DATA:  Restaging non-small cell lung cancer. * Tracking Code: BO * EXAM: CT CHEST, ABDOMEN, AND PELVIS WITH CONTRAST TECHNIQUE: Multidetector CT imaging of the chest, abdomen and pelvis was performed following the standard protocol during bolus administration of intravenous contrast. RADIATION DOSE REDUCTION: This exam was performed according to the departmental dose-optimization program which includes automated exposure control, adjustment of the mA and/or kV according to patient size and/or use of iterative reconstruction technique. CONTRAST:  OMNIPAQUE IOHEXOL 300 MG/ML  SOLN COMPARISON:  Prior examinations 06/17/2022 and 02/12/2022. FINDINGS: CT CHEST FINDINGS Cardiovascular: No acute vascular findings are demonstrated. There is atherosclerosis of the aorta, great vessels and coronary arteries. Stable mild cardiomegaly. No significant pericardial fluid. Mediastinum/Nodes: Ill-defined soft tissue thickening posterior to the right hilum measuring up to 1.7 x 1.3 cm on image 21/2  appears similar to the previous study, in part secondary to the azygous vein. There is adjacent chronic esophageal wall thickening which appears unchanged. No enlarging mediastinal, hilar or axillary lymph nodes are seen. The thyroid gland appears unremarkable. Lungs/Pleura: Right-sided PleurX catheter is unchanged in position. The mildly complex pleural effusion is unchanged in volume and appearance with mild residual smooth pleural thickening. No pleural based nodules are identified. There is no pneumothorax or significant left-sided pleural effusion. Underlying diffuse centrilobular and paraseptal emphysema. There has been no significant change in the appearance the right lung with perihilar radiation changes and volume loss. There is an enlarging and increasingly solid spiculated lesion posteriorly in  the left upper lobe, measuring 1.2 x 0.8 cm on image 58/4 (previously 0.7 x 0.6 cm, remeasured), worrisome for developing metachronous lung cancer. No other suspicious pulmonary nodules are identified. Patchy ground-glass opacities in the lingula and left lower lobe have minimally increased in the interval. Musculoskeletal/Chest wall: No chest wall mass or suspicious osseous findings. CT ABDOMEN AND PELVIS FINDINGS Hepatobiliary: The liver is normal in density without suspicious focal abnormality. No evidence of gallstones, gallbladder wall thickening or biliary dilatation. Pancreas: Unremarkable. No pancreatic ductal dilatation or surrounding inflammatory changes. Spleen: Normal in size without focal abnormality. Adrenals/Urinary Tract: Both adrenal glands appear normal. No evidence of urinary tract calculus, suspicious renal lesion or hydronephrosis. There are stable small renal cysts bilaterally for which no follow-up imaging is recommended. The bladder appears unremarkable for its degree of distention. Stomach/Bowel: No enteric contrast administered. The stomach appears unremarkable for its degree of distension. No evidence of bowel wall thickening, distention or surrounding inflammatory change. The appendix appears normal. Prominent stool again noted throughout the colon with moderate diverticular changes distally. Vascular/Lymphatic: There are no enlarged abdominal or pelvic lymph nodes. Aortic and branch vessel atherosclerosis without evidence of aneurysm or large vessel occlusion. Reproductive: Uterus and ovaries appear unremarkable. No adnexal mass. Probable tubal ligation clips bilaterally. Other: No evidence of abdominal wall mass or hernia. No ascites or pneumoperitoneum. Musculoskeletal: No acute or significant osseous findings. IMPRESSION: 1. Enlarging and increasingly solid spiculated lesion posteriorly in the left upper lobe, highly worrisome for developing metachronous lung cancer. Consider further  evaluation with PET-CT. 2. No other significant changes identified. No evidence of metastatic disease in the abdomen or pelvis. 3. Mildly increased ground-glass opacities at the left lung base, likely inflammatory or treatment related. 4. Stable appearance of the right lung with perihilar radiation changes and volume loss. Stable mildly complex right pleural effusion with PleurX catheter in place. 5. Aortic Atherosclerosis (ICD10-I70.0) and Emphysema (ICD10-J43.9). Electronically Signed   By: Carey Bullocks M.D.   On: 10/16/2022 08:50   CT Chest W Contrast  Result Date: 10/16/2022 CLINICAL DATA:  Restaging non-small cell lung cancer. * Tracking Code: BO * EXAM: CT CHEST, ABDOMEN, AND PELVIS WITH CONTRAST TECHNIQUE: Multidetector CT imaging of the chest, abdomen and pelvis was performed following the standard protocol during bolus administration of intravenous contrast. RADIATION DOSE REDUCTION: This exam was performed according to the departmental dose-optimization program which includes automated exposure control, adjustment of the mA and/or kV according to patient size and/or use of iterative reconstruction technique. CONTRAST:  OMNIPAQUE IOHEXOL 300 MG/ML  SOLN COMPARISON:  Prior examinations 06/17/2022 and 02/12/2022. FINDINGS: CT CHEST FINDINGS Cardiovascular: No acute vascular findings are demonstrated. There is atherosclerosis of the aorta, great vessels and coronary arteries. Stable mild cardiomegaly. No significant pericardial fluid. Mediastinum/Nodes: Ill-defined soft tissue thickening posterior to the right hilum  measuring up to 1.7 x 1.3 cm on image 21/2 appears similar to the previous study, in part secondary to the azygous vein. There is adjacent chronic esophageal wall thickening which appears unchanged. No enlarging mediastinal, hilar or axillary lymph nodes are seen. The thyroid gland appears unremarkable. Lungs/Pleura: Right-sided PleurX catheter is unchanged in position. The mildly  complex pleural effusion is unchanged in volume and appearance with mild residual smooth pleural thickening. No pleural based nodules are identified. There is no pneumothorax or significant left-sided pleural effusion. Underlying diffuse centrilobular and paraseptal emphysema. There has been no significant change in the appearance the right lung with perihilar radiation changes and volume loss. There is an enlarging and increasingly solid spiculated lesion posteriorly in the left upper lobe, measuring 1.2 x 0.8 cm on image 58/4 (previously 0.7 x 0.6 cm, remeasured), worrisome for developing metachronous lung cancer. No other suspicious pulmonary nodules are identified. Patchy ground-glass opacities in the lingula and left lower lobe have minimally increased in the interval. Musculoskeletal/Chest wall: No chest wall mass or suspicious osseous findings. CT ABDOMEN AND PELVIS FINDINGS Hepatobiliary: The liver is normal in density without suspicious focal abnormality. No evidence of gallstones, gallbladder wall thickening or biliary dilatation. Pancreas: Unremarkable. No pancreatic ductal dilatation or surrounding inflammatory changes. Spleen: Normal in size without focal abnormality. Adrenals/Urinary Tract: Both adrenal glands appear normal. No evidence of urinary tract calculus, suspicious renal lesion or hydronephrosis. There are stable small renal cysts bilaterally for which no follow-up imaging is recommended. The bladder appears unremarkable for its degree of distention. Stomach/Bowel: No enteric contrast administered. The stomach appears unremarkable for its degree of distension. No evidence of bowel wall thickening, distention or surrounding inflammatory change. The appendix appears normal. Prominent stool again noted throughout the colon with moderate diverticular changes distally. Vascular/Lymphatic: There are no enlarged abdominal or pelvic lymph nodes. Aortic and branch vessel atherosclerosis without  evidence of aneurysm or large vessel occlusion. Reproductive: Uterus and ovaries appear unremarkable. No adnexal mass. Probable tubal ligation clips bilaterally. Other: No evidence of abdominal wall mass or hernia. No ascites or pneumoperitoneum. Musculoskeletal: No acute or significant osseous findings. IMPRESSION: 1. Enlarging and increasingly solid spiculated lesion posteriorly in the left upper lobe, highly worrisome for developing metachronous lung cancer. Consider further evaluation with PET-CT. 2. No other significant changes identified. No evidence of metastatic disease in the abdomen or pelvis. 3. Mildly increased ground-glass opacities at the left lung base, likely inflammatory or treatment related. 4. Stable appearance of the right lung with perihilar radiation changes and volume loss. Stable mildly complex right pleural effusion with PleurX catheter in place. 5. Aortic Atherosclerosis (ICD10-I70.0) and Emphysema (ICD10-J43.9). Electronically Signed   By: Carey Bullocks M.D.   On: 10/16/2022 08:50   MR Brain W Wo Contrast  Result Date: 09/26/2022 CLINICAL DATA:  Brain metastases, assess treatment response 3T SRS Protocol EXAM: MRI HEAD WITHOUT AND WITH CONTRAST TECHNIQUE: Multiplanar, multiecho pulse sequences of the brain and surrounding structures were obtained without and with intravenous contrast. CONTRAST:  6 mL of Vueway. COMPARISON:  June 26, 2022 MRI. FINDINGS: Brain: Similar size of the enhancing lesion in the inferior right frontal lobe with continued mild increase in surrounding edema. No substantial change in size of the dominant left cerebellar lesion which measures approximately 11 by 9 mm (previously 11 x 8 mm). Surrounding edema is not substantially changed. Additional small wispy area of enhancement in the more lateral left cerebellum is similar (series 13, image 37). Similar punctate focus of enhancement  in the right cerebellum. New additional small focus of enhancement in the  more superior left cerebellum (series 13, image 44). No evidence of acute hemorrhage, acute infarct, midline shift or hydrocephalus. Vascular: Normal flow voids. Skull and upper cervical spine: Normal marrow signal. Sinuses/Orbits: Largely clear sinuses.  No acute orbital findings. Other: No mastoid effusions. IMPRESSION: 1. Similar size of the enhancing metastasis in the inferior right frontal lobe and left cerebellum. Continued slight interval increase in extent of edema in the inferior right frontal lobe. 2. New small enhancing lesion in the more superior left cerebellum. Electronically Signed   By: Feliberto Harts M.D.   On: 09/26/2022 09:44    ASSESSMENT AND PLAN: This is a very pleasant 67 years old African-American female with metastatic non-small cell lung cancer initially diagnosed with a stage IIIA non-small cell lung cancer, adenocarcinoma.  She underwent a course of concurrent chemoradiation with weekly carboplatin and paclitaxel status post 7 cycles with partial response.   The patient tolerated this course of treatment well except for mild odynophagia and dysphagia. She completed on consolidation treatment with immunotherapy with Imfinzi (Durvalumab) status post 18 cycles. She also completed SBRT to the right supraclavicular lymphadenopathy. The patient had evidence for multiple brain metastasis in October 2020 and she underwent SRS treatment to this lesion under the care of Dr. Mitzi Hansen. The patient had evidence for disease progression and she started systemic chemotherapy with carboplatin, Alimta and Keytruda status post 35 cycles.  Starting from cycle #5 she is on maintenance treatment with Alimta and Keytruda every 3 weeks.  Starting from cycle #30 the patient is on treatment with single agent Keytruda 200 Mg IV every 3 weeks.  Alimta was discontinued secondary to intolerance. The patient has been on observation since May 2023 with no concerning new complaints. She had repeat CT scan of the  chest, abdomen and pelvis performed recently.  I personally and independently reviewed the scan images and discussed the result with the patient today. Her scan showed no concerning finding for disease progression in the chest except for suspicious metachronous nodule located posteriorly in the left upper lobe worrisome for lung cancer. I recommended for the patient to have a PET scan for further evaluation of this lesion and to rule out malignancy. I will see her back for follow-up visit in 3 weeks for evaluation and discussion of her PET scan results and consideration of referral to radiation oncology for SBRT if this nodule is hypermetabolic on the PET scan. For the right-sided pleural effusion, the patient will continue her drainage with the Pleurx catheter and she is followed by Dr. Thora Lance. For the iron deficiency anemia, she will continue her oral iron supplement with vitamin C or orange juice. The patient was advised to call immediately if she has any other concerning symptoms in the interval.  Disclaimer: This note was dictated with voice recognition software. Similar sounding words can inadvertently be transcribed and may not be corrected upon review.

## 2022-10-21 NOTE — H&P (View-Only) (Signed)
Synopsis: Referred for recurrent MPE by [name], MD  Subjective:   PATIENT ID: Susan Holder GENDER: female DOB: 1955-05-15, MRN: [id number]  Chief Complaint  Patient presents with   Follow-up    F/up and qualifying walk.   Susan Holder is a 67 y.o. female female former smoker , quit 2019 with a 45 pack year smoking history with stage IV  Right lung cancer, brain metastasis, pericardial effusion status post pericardial window, history of rheumatoid arthritis RF, ANA and anti-CCP positive.  Patient currently followed by  Dr. Shirline Frees from medical oncology.  She had a Flexible bronchoscopy with bronchial alveolar lavage  and Transbronchial lung biopsy, single lobe , with right sided Pleurex tube  placement in 03/11/2022. Biopsy to of the Left upper lung was negative for a new  primary bronchogenic cancer. She requires every other day drainage of her right sided pleurex catheter.  Seen in office 06/03/22 with some discomfort around tunnel site. Still draining 300cc qod at that time, which is still the case - still yellow. Had scab near exit site. No surrounding erythema or drainage from around catheter that she's aware of - her cousin who is CMA at least hasn't mentioned any. No fever.  Uses oxygen 2L with activity.   Interval HPI:   Walked for POC - needs 3L O2 pulsed with exertion  Enlarging spiculated posterior 1.2cm LUL nodule on CT Chest 6/20  No change in DOE, cough.   Drainage still 300cc every 2-3 days.   Otherwise pertinent review of systems is negative.  Past Medical History:  Diagnosis Date   Acid reflux 09/21/2018   Adenocarcinoma of right lung, stage 4 (HCC) 11/17/2017   ADHD (attention deficit hyperactivity disorder)    patient denies this dx as of 03/10/22   Allergic rhinitis 08/22/2013   ANA positive 05/29/2016   Anemia    Aortic atherosclerosis (HCC) 10/23/2020   Brain metastases 03/29/2019   COPD (chronic obstructive pulmonary disease) (HCC)     Coronary artery calcification seen on CT scan 10/23/2020   patient is unaware of this dx as of 03/10/22   DDD (degenerative disc disease), cervical 05/29/2016   patient denies this dx as of 03/10/22   Dyspnea    Encounter for antineoplastic chemotherapy 11/17/2017   Encounter for antineoplastic immunotherapy 02/01/2018   Goals of care, counseling/discussion 11/17/2017   High risk medication use 05/29/2016   04/29/2016: ==> plq 200 am & 100 qhs(adeq response).   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   Other fatigue 05/29/2016   Pericardial effusion 10/23/2020   Pneumonia    as a child   Primary malignant neoplasm of bronchus of right lower lobe (HCC) 12/21/2017   Rheumatoid arthritis (HCC)    Rheumatoid arthritis involving multiple sites with positive rheumatoid factor (HCC) 05/29/2016   +RF +ANA +CCP    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   Smoker 05/29/2016   quit 2019   Trigger finger, left ring finger 05/29/2016   Trigger finger, right ring finger 05/29/2016   Vitamin D deficiency 05/29/2016     Family History  Problem Relation Age of Onset   Stroke Mother    Alzheimer's disease Mother    Heart disease  Mother    Emphysema Father    Hypertension Brother    Heart attack Maternal Aunt    Heart failure Maternal Grandmother    Hypertension Paternal Grandmother      Past Surgical History:  Procedure Laterality Date   BRONCHIAL BIOPSY  03/11/2022   Procedure: BRONCHIAL BIOPSIES;  Surgeon: Omar Person, MD;  Location: [facility] ENDOSCOPY;  Service: Pulmonary;;   BRONCHIAL NEEDLE ASPIRATION BIOPSY  11/09/2017   Procedure: BRONCHIAL NEEDLE ASPIRATION BIOPSIES;  Surgeon: Chilton Greathouse, MD;  Location: [facility] ENDOSCOPY;  Service: Cardiopulmonary;;    BRONCHIAL WASHINGS  03/11/2022   Procedure: BRONCHIAL WASHINGS;  Surgeon: Omar Person, MD;  Location: [facility] ENDOSCOPY;  Service: Pulmonary;;   CHEST TUBE INSERTION Left 05/13/2021   Procedure: INSERTION PLEURAL DRAINAGE CATHETER;  Surgeon: Corliss Skains, MD;  Location: [facility] OR;  Service: Thoracic;  Laterality: Left;   CHEST TUBE INSERTION Right 03/11/2022   Procedure: INSERTION PLEURAL DRAINAGE CATHETER;  Surgeon: Omar Person, MD;  Location[facility] ENDOSCOPY;  Service: Pulmonary;  Laterality: Right;  indwelling pleural catheter, w/ cuff   ENDOBRONCHIAL ULTRASOUND Bilateral 11/09/2017   Procedure: ENDOBRONCHIAL ULTRASOUND;  Surgeon: Chilton Greathouse, MD;  Location: [facility] ENDOSCOPY;  Service: Cardiopulmonary;  Laterality: Bilateral;   NASAL SINUS SURGERY     NASAL SINUS SURGERY     THORACENTESIS Right 02/07/2021   Procedure: THORACENTESIS;  Surgeon: Corliss Skains, MD;  Location[facility] OR;  Service: Thoracic;  Laterality: Right;   TUBAL LIGATION     VIDEO BRONCHOSCOPY  11/09/2017   Procedure: VIDEO BRONCHOSCOPY;  Surgeon: Chilton Greathouse, MD;  Location: [facility] ENDOSCOPY;  Service: Cardiopulmonary;;   VIDEO BRONCHOSCOPY Left 03/11/2022   Procedure: VIDEO BRONCHOSCOPY WITH FLUORO;  Surgeon: Omar Person, MD;  Location: [facility] ENDOSCOPY;  Service: Pulmonary;  Laterality: Left;   XI ROBOTIC ASSISTED PERICARDIAL WINDOW Left 02/07/2021   Procedure: XI ROBOTIC ASSISTED THORACOSCOPY PERICARDIAL WINDOW;  Surgeon: Corliss Skains, MD;  Location: [facility] OR;  Service: Thoracic;  Laterality: Left;    Social History   Socioeconomic History   Marital status: Divorced    Spouse name: Not on file   Number of children: 1   Years of education: Not on file   Highest education level: Not on file  Occupational History   Not on file  Tobacco Use   Smoking status: Former    Packs/day: 0.50    Years: 45.00    Additional pack years: 0.00    Total pack years: 22.50    Types: Cigarettes    Quit date:  09/29/2017    Years since quitting: 5.0   Smokeless tobacco: Never  Vaping Use   Vaping Use: Never used  Substance and Sexual Activity   Alcohol use: Not Currently   Drug use: No   Sexual activity: Not Currently    Birth control/protection: Post-menopausal  Other Topics Concern   Not on file  Social History Narrative   Not on file   Social Determinants of Health   Financial Resource Strain: Low Risk  (12/03/2021)   Overall Financial Resource Strain (CARDIA)    Difficulty of Paying Living Expenses: Not hard at all  Food Insecurity: No Food Insecurity (11/06/2020)   Hunger Vital Sign    Worried About Running Out of Food in the Last Year: Never true    Ran Out of Food in the Last Year: Never true  Transportation Needs: No Transportation Needs (12/03/2021)   PRAPARE - Administrator, Civil Service (Medical): No  Lack of Transportation (Non-Medical): No  Physical Activity: Inactive (12/03/2021)   Exercise Vital Sign    Days of Exercise per Week: 0 days    Minutes of Exercise per Session: 0 min  Stress: No Stress Concern Present (12/03/2021)   [employer] of Occupational Health - Occupational Stress Questionnaire    Feeling of Stress : Not at all  Social Connections: Unknown (12/03/2021)   Social Connection and Isolation Panel [NHANES]    Frequency of Communication with Friends and Family: More than three times a week    Frequency of Social Gatherings with Friends and Family: Never    Attends Religious Services: More than 4 times per year    Active Member of [employer] or Organizations: Yes    Attends Engineer, structural: More than 4 times per year    Marital Status: Not on file  Intimate Partner Violence: Not At Risk (12/03/2021)   Humiliation, Afraid, Rape, and Kick questionnaire    Fear of Current or Ex-Partner: No    Emotionally Abused: No    Physically Abused: No    Sexually Abused: No     No Known Allergies   Outpatient Medications Prior to Visit   Medication Sig Dispense Refill   acetaminophen (TYLENOL) 325 MG tablet Take 2 tablets (650 mg total) by mouth every 6 (six) hours. (Patient taking differently: Take 650 mg by mouth every 6 (six) hours as needed for moderate pain.)     Ascorbic Acid (VITAMIN C) 250 MG CHEW Chew 500 mg by mouth daily. Gummy     Cholecalciferol (VITAMIN D-3) 125 MCG (5000 UT) TABS Take 5,000 Units by mouth daily.     fluticasone (FLONASE) 50 MCG/ACT nasal spray Place 2 sprays into both nostrils daily. (Patient taking differently: Place 2 sprays into both nostrils daily as needed for allergies or rhinitis.) 16 g 6   hydroxychloroquine (PLAQUENIL) 200 MG tablet TAKE 1 TABLET BY MOUTH TWICE DAILY MONDAY- FRIDAY ONLY 120 tablet 0   lidocaine 4 % Place 1 patch onto the skin daily. 30 patch 11   omeprazole (PRILOSEC) 20 MG capsule Take 20 mg by mouth daily.     prochlorperazine (COMPAZINE) 10 MG tablet TAKE 1 TABLET(10 MG) BY MOUTH EVERY 6 HOURS AS NEEDED FOR NAUSEA OR VOMITING 30 tablet 2   UNABLE TO FIND Med Name  Amoclav  25 mg   twice a day for infection (ulcer) in mouth     No facility-administered medications prior to visit.       Objective:   Physical Exam:  General appearance: 67 y.o., female, NAD, conversant  Eyes: anicteric sclerae; PERRL, tracking appropriately HENT: NCAT; MMM Neck: Trachea midline; no lymphadenopathy, no JVD Lungs: diminished on right, with normal respiratory effort CV: RRR, no murmur  Abdomen: Soft, non-tender; non-distended, BS present  Extremities: No peripheral edema, warm Psych: Appropriate affect Neuro: Alert and oriented to person and place, no focal deficit       Vitals:   10/23/22 1440  BP: 120/60  Pulse: (!) 106  SpO2: 99%  Weight: 152 lb 6.4 oz (69.1 kg)  Height: 5' (1.524 m)     99% on 2 LPM  BMI Readings from Last 3 Encounters:  10/23/22 29.76 kg/m  10/16/22 29.67 kg/m  09/26/22 28.32 kg/m   Wt Readings from Last 3 Encounters:  10/23/22 152 lb  6.4 oz (69.1 kg)  10/16/22 151 lb 14.4 oz (68.9 kg)  09/26/22 145 lb (65.8 kg)     CBC  Component Value Date/Time   WBC 7.7 10/14/2022 1207   WBC 6.5 10/30/2021 1428   RBC 3.69 (L) 10/14/2022 1207   HGB 10.6 (L) 10/14/2022 1207   HCT 35.3 (L) 10/14/2022 1207   PLT 289 10/14/2022 1207   MCV 95.7 10/14/2022 1207   MCH 28.7 10/14/2022 1207   MCHC 30.0 10/14/2022 1207   RDW 13.3 10/14/2022 1207   LYMPHSABS 0.8 10/14/2022 1207   MONOABS 1.0 10/14/2022 1207   EOSABS 0.1 10/14/2022 1207   BASOSABS 0.0 10/14/2022 1207     Chest Imaging: CT Chest 06/18/22: stabel 7mm LUL nodule, slight decrease in size chroinc right effusion  CT Chest w contrast 10/16/22:   Pulmonary Functions Testing Results:    Latest Ref Rng & Units 03/08/2018   11:01 AM  PFT Results  FVC-Pre L 2.00   FVC-Predicted Pre % 104   FVC-Post L 1.91   FVC-Predicted Post % 99   Pre FEV1/FVC % % 73   Post FEV1/FCV % % 74   FEV1-Pre L 1.47   FEV1-Predicted Pre % 99   FEV1-Post L 1.42   TLC L 2.74   TLC % Predicted % 65   RV % Predicted % 61      Echocardiogram 2022:   1. Left ventricular ejection fraction, by estimation, is 40 to 45%. The  left ventricle has mildly decreased function. The left ventricle  demonstrates global hypokinesis. The left ventricular internal cavity size  was mildly dilated. Left ventricular  diastolic parameters are consistent with Grade I diastolic dysfunction  (impaired relaxation).   2. Right ventricular systolic function is moderately reduced. The right  ventricular size is normal. There is normal pulmonary artery systolic  pressure.   3. There is a moderate circumferential pericardial effusion. There is no  collapse of the RV or RA. The MV inflow pattern is normal. There is no  evidence of pericardial tamponade but I would recommend close clinical  follow up for further evaluation . Marland Kitchen  Moderate pericardial effusion. The pericardial effusion is  circumferential. There  is no evidence of cardiac tamponade.   4. The mitral valve is grossly normal. No evidence of mitral valve  regurgitation.   5. The aortic valve is grossly normal. Aortic valve regurgitation is  moderate.   6. The IVC is normal size. There is inspiratory collapse of the IVC. Marland Kitchen  The inferior vena cava is normal in size with greater than 50% respiratory  variability, suggesting right atrial pressure of 3 mmHg.    Assessment & Plan:   # Stage IIIA NSCLC c/b disease progression with brain mets 2020 s/p chemo, immunotherapy, RT now on observation # Enlarging LUL pulmonary nodule  # DOE # Recurrent right pleural effusion with nonexpandable lung s/p R pleurx 03/11/22 # Chronic hypoxic respiratory failure on 2L with exertion and sleep   Plan: - navigation bronch under general, Dr. Arbutus Ped has ordered PET/CT - Look for aquaguard dressing to place over your pleurx when you shower - keep doing what you're doing with the catheter - PleurX is standard of care for treating recurrent pleural effusion with nonexpandable lung, unfortunately no role for instillation of talc slurry for pleurodesis. But it does appear that the fluid on your right side probably accumulates pretty slowly. If you decide you'd like to try to take out the current pleurx catheter, that is ok but there is chance that the fluid will reaccumulate and further compress your right lung, causing more trouble breathing than you currently have.  We would plan to replace pleurx if that was the case based on our discussion today.  - see you in July or sooner if need be!   PleurX Home Management Instructions for you: If draining >=372ml, every 3 days as you are If draining 200-300 ml, drain every 4 days If draining 100-200 ml, drain every 4-5 days When you drain 50 ml three consecutive times in a row, notify your doctor so we can check a chest x-ray and possibly remove the catheter. Please keep a log and bring it with you to your clinic  appointment.    Omar Person, MD [facility] 10/23/2022 3:06 PM

## 2022-10-21 NOTE — Progress Notes (Unsigned)
Synopsis: Referred for recurrent MPE by [name], MD  Subjective:   PATIENT ID: Susan Holder GENDER: female DOB: January 03, 1956, MRN: [id number]  No chief complaint on file.  Susan Holder is a 67 y.o. female female former smoker , quit 2019 with a 45 pack year smoking history with stage IV  Right lung cancer, brain metastasis, pericardial effusion status post pericardial window, history of rheumatoid arthritis RF, ANA and anti-CCP positive.  Patient currently followed by  Dr. Shirline Frees from medical oncology.  She had a Flexible bronchoscopy with bronchial alveolar lavage  and Transbronchial lung biopsy, single lobe , with right sided Pleurex tube  placement in 03/11/2022. Biopsy to of the Left upper lung was negative for a new  primary bronchogenic cancer. She requires every other day drainage of her right sided pleurex catheter.  Seen in office 06/03/22 with some discomfort around tunnel site. Still draining 300cc qod at that time, which is still the case - still yellow. Had scab near exit site. No surrounding erythema or drainage from around catheter that she's aware of - her cousin who is CMA at least hasn't mentioned any. No fever.  Uses oxygen 2L with activity.   Interval HPI: Encouraged to drain pleurx twice weekly at last visit with Dr. Shirline Frees given small quantity of drainage.  Surveillance imaging in February without disease progression on observation  She says she doesn't feel better after draining effusion every 3rd day yet still draining 300cc with some pain. She is using rolling stopcock to minimize pain. Says she can tolerate it and prefers not to use opioid medications.  ---------------------------- PFTs  Walk for POC  Enlarging spiculated posterior 1.2cm LUL nodule on CT Chest 6/20  Otherwise pertinent review of systems is negative.  Past Medical History:  Diagnosis Date   Acid reflux 09/21/2018   Adenocarcinoma of right lung, stage 4 (HCC) 11/17/2017    ADHD (attention deficit hyperactivity disorder)    patient denies this dx as of 03/10/22   Allergic rhinitis 08/22/2013   ANA positive 05/29/2016   Anemia    Aortic atherosclerosis (HCC) 10/23/2020   Brain metastases 03/29/2019   COPD (chronic obstructive pulmonary disease) (HCC)    Coronary artery calcification seen on CT scan 10/23/2020   patient is unaware of this dx as of 03/10/22   DDD (degenerative disc disease), cervical 05/29/2016   patient denies this dx as of 03/10/22   Dyspnea    Encounter for antineoplastic chemotherapy 11/17/2017   Encounter for antineoplastic immunotherapy 02/01/2018   Goals of care, counseling/discussion 11/17/2017   High risk medication use 05/29/2016   04/29/2016: ==> plq 200 am & 100 qhs(adeq response).   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   Other fatigue 05/29/2016   Pericardial effusion 10/23/2020   Pneumonia    as a child   Primary malignant neoplasm of bronchus of right lower lobe (HCC) 12/21/2017   Rheumatoid arthritis (HCC)    Rheumatoid arthritis involving multiple sites with positive rheumatoid factor (HCC) 05/29/2016   +RF +ANA +CCP    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   Smoker 05/29/2016   quit 2019   Trigger finger, left ring finger 05/29/2016   Trigger finger, right ring  finger 05/29/2016   Vitamin D deficiency 05/29/2016     Family History  Problem Relation Age of Onset   Stroke Mother    Alzheimer's disease Mother    Heart disease Mother    Emphysema Father    Hypertension Brother    Heart attack Maternal Aunt    Heart failure Maternal Grandmother    Hypertension Paternal Grandmother      Past Surgical History:  Procedure Laterality Date   BRONCHIAL BIOPSY  03/11/2022   Procedure:  BRONCHIAL BIOPSIES;  Surgeon: Omar Person, MD;  Location: [facility] ENDOSCOPY;  Service: Pulmonary;;   BRONCHIAL NEEDLE ASPIRATION BIOPSY  11/09/2017   Procedure: BRONCHIAL NEEDLE ASPIRATION BIOPSIES;  Surgeon: Chilton Greathouse, MD;  Location: [facility] ENDOSCOPY;  Service: Cardiopulmonary;;   BRONCHIAL WASHINGS  03/11/2022   Procedure: BRONCHIAL WASHINGS;  Surgeon: Omar Person, MD;  Location: [facility] ENDOSCOPY;  Service: Pulmonary;;   CHEST TUBE INSERTION Left 05/13/2021   Procedure: INSERTION PLEURAL DRAINAGE CATHETER;  Surgeon: Corliss Skains, MD;  Location: [facility] OR;  Service: Thoracic;  Laterality: Left;   CHEST TUBE INSERTION Right 03/11/2022   Procedure: INSERTION PLEURAL DRAINAGE CATHETER;  Surgeon: Omar Person, MD;  Location: [facility] ENDOSCOPY;  Service: Pulmonary;  Laterality: Right;  indwelling pleural catheter, w/ cuff   ENDOBRONCHIAL ULTRASOUND Bilateral 11/09/2017   Procedure: ENDOBRONCHIAL ULTRASOUND;  Surgeon: Chilton Greathouse, MD;  Location: [facility] ENDOSCOPY;  Service: Cardiopulmonary;  Laterality: Bilateral;   NASAL SINUS SURGERY     NASAL SINUS SURGERY     THORACENTESIS Right 02/07/2021   Procedure: THORACENTESIS;  Surgeon: Corliss Skains, MD;  Location: [facility] OR;  Service: Thoracic;  Laterality: Right;   TUBAL LIGATION     VIDEO BRONCHOSCOPY  11/09/2017   Procedure: VIDEO BRONCHOSCOPY;  Surgeon: Chilton Greathouse, MD;  Location: [facility] ENDOSCOPY;  Service: Cardiopulmonary;;   VIDEO BRONCHOSCOPY Left 03/11/2022   Procedure: VIDEO BRONCHOSCOPY WITH FLUORO;  Surgeon: Omar Person, MD;  Location: [facility] ENDOSCOPY;  Service: Pulmonary;  Laterality: Left;   XI ROBOTIC ASSISTED PERICARDIAL WINDOW Left 02/07/2021   Procedure: XI ROBOTIC ASSISTED THORACOSCOPY PERICARDIAL WINDOW;  Surgeon: Corliss Skains, MD;  Location: [facility] OR;  Service: Thoracic;  Laterality: Left;    Social History   Socioeconomic History   Marital status: Divorced    Spouse name: Not on file   Number of children:  1   Years of education: Not on file   Highest education level: Not on file  Occupational History   Not on file  Tobacco Use   Smoking status: Former    Packs/day: 0.50    Years: 45.00    Additional pack years: 0.00    Total pack years: 22.50    Types: Cigarettes    Quit date: 09/29/2017    Years since quitting: 5.0   Smokeless tobacco: Never  Vaping Use   Vaping Use: Never used  Substance and Sexual Activity   Alcohol use: Not Currently   Drug use: No   Sexual activity: Not Currently    Birth control/protection: Post-menopausal  Other Topics Concern   Not on file  Social History Narrative   Not on file   Social Determinants of Health   Financial Resource Strain: Low Risk  (12/03/2021)   Overall Financial Resource Strain (CARDIA)    Difficulty of Paying Living Expenses: Not hard at all  Food Insecurity: No Food Insecurity (11/06/2020)   Hunger Vital Sign    Worried About Running Out of Food in the Last Year: Never  true    Ran Out of Food in the Last Year: Never true  Transportation Needs: No Transportation Needs (12/03/2021)   PRAPARE - Administrator, Civil Service (Medical): No    Lack of Transportation (Non-Medical): No  Physical Activity: Inactive (12/03/2021)   Exercise Vital Sign    Days of Exercise per Week: 0 days    Minutes of Exercise per Session: 0 min  Stress: No Stress Concern Present (12/03/2021)   [employer] of Occupational Health - Occupational Stress Questionnaire    Feeling of Stress : Not at all  Social Connections: Unknown (12/03/2021)   Social Connection and Isolation Panel [NHANES]    Frequency of Communication with Friends and Family: More than three times a week    Frequency of Social Gatherings with Friends and Family: Never    Attends Religious Services: More than 4 times per year    Active Member of [employer] or Organizations: Yes    Attends Engineer, structural: More than 4 times per year    Marital Status: Not on file   Intimate Partner Violence: Not At Risk (12/03/2021)   Humiliation, Afraid, Rape, and Kick questionnaire    Fear of Current or Ex-Partner: No    Emotionally Abused: No    Physically Abused: No    Sexually Abused: No     No Known Allergies   Outpatient Medications Prior to Visit  Medication Sig Dispense Refill   acetaminophen (TYLENOL) 325 MG tablet Take 2 tablets (650 mg total) by mouth every 6 (six) hours. (Patient taking differently: Take 650 mg by mouth every 6 (six) hours as needed for moderate pain.)     Ascorbic Acid (VITAMIN C) 250 MG CHEW Chew 500 mg by mouth daily. Gummy     Cholecalciferol (VITAMIN D-3) 125 MCG (5000 UT) TABS Take 5,000 Units by mouth daily.     fluticasone (FLONASE) 50 MCG/ACT nasal spray Place 2 sprays into both nostrils daily. (Patient taking differently: Place 2 sprays into both nostrils daily as needed for allergies or rhinitis.) 16 g 6   hydroxychloroquine (PLAQUENIL) 200 MG tablet TAKE 1 TABLET BY MOUTH TWICE DAILY MONDAY- FRIDAY ONLY 120 tablet 0   lidocaine 4 % Place 1 patch onto the skin daily. 30 patch 11   omeprazole (PRILOSEC) 20 MG capsule Take 20 mg by mouth daily.     prochlorperazine (COMPAZINE) 10 MG tablet TAKE 1 TABLET(10 MG) BY MOUTH EVERY 6 HOURS AS NEEDED FOR NAUSEA OR VOMITING 30 tablet 2   UNABLE TO FIND Med Name  Amoclav  25 mg   twice a day for infection (ulcer) in mouth     No facility-administered medications prior to visit.       Objective:   Physical Exam:  General appearance: 67 y.o., female, NAD, conversant  Eyes: anicteric sclerae; PERRL, tracking appropriately HENT: NCAT; MMM Neck: Trachea midline; no lymphadenopathy, no JVD Lungs: diminished on right, with normal respiratory effort CV: RRR, no murmur  Abdomen: Soft, non-tender; non-distended, BS present  Extremities: No peripheral edema, warm Psych: Appropriate affect Neuro: Alert and oriented to person and place, no focal deficit       There were no vitals  filed for this visit.     on 2 LPM  BMI Readings from Last 3 Encounters:  10/16/22 29.67 kg/m  09/26/22 28.32 kg/m  07/28/22 28.90 kg/m   Wt Readings from Last 3 Encounters:  10/16/22 151 lb 14.4 oz (68.9 kg)  09/26/22 145 lb (65.8  kg)  07/28/22 148 lb (67.1 kg)     CBC    Component Value Date/Time   WBC 7.7 10/14/2022 1207   WBC 6.5 10/30/2021 1428   RBC 3.69 (L) 10/14/2022 1207   HGB 10.6 (L) 10/14/2022 1207   HCT 35.3 (L) 10/14/2022 1207   PLT 289 10/14/2022 1207   MCV 95.7 10/14/2022 1207   MCH 28.7 10/14/2022 1207   MCHC 30.0 10/14/2022 1207   RDW 13.3 10/14/2022 1207   LYMPHSABS 0.8 10/14/2022 1207   MONOABS 1.0 10/14/2022 1207   EOSABS 0.1 10/14/2022 1207   BASOSABS 0.0 10/14/2022 1207     Chest Imaging: CT Chest 06/18/22: stabel 7mm LUL nodule, slight decrease in size chroinc right effusion  CT Chest w contrast 10/16/22:   Pulmonary Functions Testing Results:    Latest Ref Rng & Units 03/08/2018   11:01 AM  PFT Results  FVC-Pre L 2.00   FVC-Predicted Pre % 104   FVC-Post L 1.91   FVC-Predicted Post % 99   Pre FEV1/FVC % % 73   Post FEV1/FCV % % 74   FEV1-Pre L 1.47   FEV1-Predicted Pre % 99   FEV1-Post L 1.42   TLC L 2.74   TLC % Predicted % 65   RV % Predicted % 61      Echocardiogram 2022:   1. Left ventricular ejection fraction, by estimation, is 40 to 45%. The  left ventricle has mildly decreased function. The left ventricle  demonstrates global hypokinesis. The left ventricular internal cavity size  was mildly dilated. Left ventricular  diastolic parameters are consistent with Grade I diastolic dysfunction  (impaired relaxation).   2. Right ventricular systolic function is moderately reduced. The right  ventricular size is normal. There is normal pulmonary artery systolic  pressure.   3. There is a moderate circumferential pericardial effusion. There is no  collapse of the RV or RA. The MV inflow pattern is normal. There is no   evidence of pericardial tamponade but I would recommend close clinical  follow up for further evaluation . Marland Kitchen  Moderate pericardial effusion. The pericardial effusion is  circumferential. There is no evidence of cardiac tamponade.   4. The mitral valve is grossly normal. No evidence of mitral valve  regurgitation.   5. The aortic valve is grossly normal. Aortic valve regurgitation is  moderate.   6. The IVC is normal size. There is inspiratory collapse of the IVC. Marland Kitchen  The inferior vena cava is normal in size with greater than 50% respiratory  variability, suggesting right atrial pressure of 3 mmHg.    Assessment & Plan:   # Stage IIIA NSCLC c/b disease progression with brain mets 2020 s/p chemo, immunotherapy, RT now on observation # DOE # Recurrent right pleural effusion with nonexpandable lung s/p R pleurx 03/11/22 # Chronic hypoxic respiratory failure on 2L with exertion and sleep  Plan: - PFTs next visit - Look for aquaguard dressing to place over your pleurx when you shower - keep doing what you're doing with the catheter - PleurX is standard of care for treating recurrent pleural effusion with nonexpandable lung, unfortunately no role for instillation of talc slurry for pleurodesis. But it does appear that the fluid on your right side probably accumulates pretty slowly. If you decide you'd like to try to take out the current pleurx catheter, that is ok but there is chance that the fluid will reaccumulate and further compress your right lung, causing more trouble breathing than you  currently have. We would plan to replace pleurx if that was the case based on our discussion today.  - see you in July or sooner if need be!   PleurX Home Management Instructions for you: If draining >=393ml, every 3 days as you are If draining 200-300 ml, drain every 4 days If draining 100-200 ml, drain every 4-5 days When you drain 50 ml three consecutive times in a row, notify your doctor so we can  check a chest x-ray and possibly remove the catheter. Please keep a log and bring it with you to your clinic appointment.   I spent 32 minutes dedicated to the care of this patient on the date of this encounter to include pre-visit review of records, face-to-face time with the patient discussing conditions above, post visit ordering of testing, clinical documentation with the electronic health record, and communicating necessary findings to members of the patients care team.    Omar Person, MD [facility] 10/21/2022 12:23 PM

## 2022-10-23 ENCOUNTER — Ambulatory Visit: Payer: Medicare PPO | Admitting: Student

## 2022-10-23 ENCOUNTER — Encounter: Payer: Self-pay | Admitting: Student

## 2022-10-23 ENCOUNTER — Telehealth: Payer: Self-pay | Admitting: Student

## 2022-10-23 VITALS — BP 120/60 | HR 106 | Ht 60.0 in | Wt 152.4 lb

## 2022-10-23 DIAGNOSIS — R911 Solitary pulmonary nodule: Secondary | ICD-10-CM | POA: Diagnosis not present

## 2022-10-23 DIAGNOSIS — J9611 Chronic respiratory failure with hypoxia: Secondary | ICD-10-CM | POA: Diagnosis not present

## 2022-10-23 DIAGNOSIS — J910 Malignant pleural effusion: Secondary | ICD-10-CM | POA: Diagnosis not present

## 2022-10-23 NOTE — Patient Instructions (Addendum)
-   Nothing to eat or drink after midnight the night before your procedure - appt with APP in 3 weeks to review results. Can follow with Dr. Francine Graven long term

## 2022-10-23 NOTE — Telephone Encounter (Signed)
I am working on this bronch - will have to call Shannon in the morning about scheduling.  Info is in referral.  Will close out phone message.

## 2022-10-24 ENCOUNTER — Telehealth: Payer: Self-pay | Admitting: Student

## 2022-10-24 ENCOUNTER — Encounter: Payer: Self-pay | Admitting: Student

## 2022-10-24 NOTE — Telephone Encounter (Signed)
Dumb question but can she change from [employer] to another oxygen equipment supplier to get POC and her home concentrator?  Thanks!

## 2022-10-24 NOTE — Telephone Encounter (Signed)
I received a message from [place] with [employer] " Patient has been on rent for oxygen for too long to be transitioned to a poc. If you would like [place] to try smaller tanks, please let us know and have an order keyed with ocd at a set setting or for [place] to evaluate on ocd to keep saturations above 90%" please advise and place order if needed

## 2022-10-26 DIAGNOSIS — C3490 Malignant neoplasm of unspecified part of unspecified bronchus or lung: Secondary | ICD-10-CM | POA: Diagnosis not present

## 2022-11-05 ENCOUNTER — Encounter (HOSPITAL_COMMUNITY): Payer: Self-pay | Admitting: Student

## 2022-11-05 ENCOUNTER — Other Ambulatory Visit: Payer: Self-pay

## 2022-11-05 NOTE — Progress Notes (Signed)
PCP - Dr Janee Morn at [facility]  Cardiologist - Dr Cliffton Asters  CT Chest x-ray - 10/14/22 EKG - DOS Stress Test - n/a ECHO - 11/09/20 Cardiac Cath - n/a  ICD Pacemaker/Loop - n/a  Sleep Study -  n/a CPAP - none  Diabetes Type - n/a  NPO  Anesthesia review: Yes  STOP now taking any Aspirin (unless otherwise instructed by your surgeon), Aleve, Naproxen, Ibuprofen, Motrin, Advil, Goody's, BC's, all herbal medications, fish oil, and all vitamins.   Coronavirus Screening Do you have any of the following symptoms:  Cough Yes Fever (>100.63F)  yes/no: No Runny nose yes/no: No Sore throat yes/no: No Difficulty breathing/shortness of breath  yes/no: Yes uses oxygen 2L via [place]  Have you traveled in the last 14 days and where? yes/no: No  Patient verbalized understanding of instructions that were given via phone.

## 2022-11-06 ENCOUNTER — Ambulatory Visit (HOSPITAL_COMMUNITY)
Admission: RE | Admit: 2022-11-06 | Discharge: 2022-11-06 | Disposition: A | Discharge status: Home / Self Care | Payer: Medicare PPO | Source: Ambulatory Visit | Attending: Student | Admitting: Student

## 2022-11-06 ENCOUNTER — Ambulatory Visit (HOSPITAL_COMMUNITY)
Admission: RE | Admit: 2022-11-06 | Discharge: 2022-11-06 | Disposition: A | Discharge status: Home / Self Care | Payer: Medicare PPO | Source: Ambulatory Visit | Attending: Internal Medicine | Admitting: Internal Medicine

## 2022-11-06 DIAGNOSIS — C3490 Malignant neoplasm of unspecified part of unspecified bronchus or lung: Secondary | ICD-10-CM | POA: Diagnosis not present

## 2022-11-06 DIAGNOSIS — J90 Pleural effusion, not elsewhere classified: Secondary | ICD-10-CM | POA: Diagnosis not present

## 2022-11-06 DIAGNOSIS — C3412 Malignant neoplasm of upper lobe, left bronchus or lung: Secondary | ICD-10-CM | POA: Diagnosis not present

## 2022-11-06 DIAGNOSIS — R911 Solitary pulmonary nodule: Secondary | ICD-10-CM

## 2022-11-06 DIAGNOSIS — I2510 Atherosclerotic heart disease of native coronary artery without angina pectoris: Secondary | ICD-10-CM | POA: Diagnosis not present

## 2022-11-06 DIAGNOSIS — K5730 Diverticulosis of large intestine without perforation or abscess without bleeding: Secondary | ICD-10-CM | POA: Diagnosis not present

## 2022-11-06 LAB — GLUCOSE, CAPILLARY: Glucose-Capillary: 95 mg/dL (ref 70–99)

## 2022-11-06 MED ORDER — FLUDEOXYGLUCOSE F - 18 (FDG) INJECTION
7.8000 | Freq: Once | INTRAVENOUS | Status: AC
  Administered 2022-11-06: 7.48 via INTRAVENOUS

## 2022-11-06 NOTE — Anesthesia Preprocedure Evaluation (Addendum)
Anesthesia Evaluation  Patient identified by MRN, date of birth, ID band Patient awake    Reviewed: Allergy & Precautions, NPO status , Patient's Chart, lab work & pertinent test results  Airway Mallampati: III  TM Distance: >3 FB Neck ROM: Full    Dental  (+) Chipped,    Pulmonary shortness of breath and Long-Term Oxygen Therapy, COPD,  oxygen dependent, former smoker   Pulmonary exam normal        Cardiovascular negative cardio ROS Normal cardiovascular exam  ECHO: (2022) 1. Left ventricular ejection fraction, by estimation, is 40 to 45%. The  left ventricle has mildly decreased function. The left ventricle  demonstrates global hypokinesis. The left ventricular internal cavity size  was mildly dilated. Left ventricular  diastolic parameters are consistent with Grade I diastolic dysfunction  (impaired relaxation).   2. Right ventricular systolic function is moderately reduced. The right  ventricular size is normal. There is normal pulmonary artery systolic  pressure.   3. There is a moderate circumferential pericardial effusion. There is no  collapse of the RV or RA. The MV inflow pattern is normal. There is no  evidence of pericardial tamponade but I would recommend close clinical  follow up for further evaluation . Marland Kitchen  Moderate pericardial effusion. The pericardial effusion is  circumferential. There is no evidence of cardiac tamponade.   4. The mitral valve is grossly normal. No evidence of mitral valve  regurgitation.   5. The aortic valve is grossly normal. Aortic valve regurgitation is  moderate.   6. The IVC is normal size. There is inspiratory collapse of the IVC. Marland Kitchen  The inferior vena cava is normal in size with greater than 50% respiratory  variability, suggesting right atrial pressure of 3 mmHg.     Neuro/Psych  PSYCHIATRIC DISORDERS      negative neurological ROS     GI/Hepatic Neg liver ROS,GERD  Medicated  and Controlled,,  Endo/Other  negative endocrine ROS    Renal/GU negative Renal ROS     Musculoskeletal  (+) Arthritis , Rheumatoid disorders,    Abdominal   Peds  Hematology negative hematology ROS (+)   Anesthesia Other Findings lung nodule  Reproductive/Obstetrics                             Anesthesia Physical Anesthesia Plan  ASA: 4  Anesthesia Plan: General   Post-op Pain Management:    Induction: Intravenous  PONV Risk Score and Plan: 3 and Ondansetron, Dexamethasone and Treatment may vary due to age or medical condition  Airway Management Planned: Oral ETT  Additional Equipment:   Intra-op Plan:   Post-operative Plan: Extubation in OR  Informed Consent: I have reviewed the patients History and Physical, chart, labs and discussed the procedure including the risks, benefits and alternatives for the proposed anesthesia with the patient or authorized representative who has indicated his/her understanding and acceptance.     Dental advisory given  Plan Discussed with: CRNA  Anesthesia Plan Comments:         Anesthesia Quick Evaluation

## 2022-11-07 ENCOUNTER — Ambulatory Visit (HOSPITAL_COMMUNITY): Payer: Medicare PPO

## 2022-11-07 ENCOUNTER — Other Ambulatory Visit: Payer: Self-pay

## 2022-11-07 ENCOUNTER — Encounter (HOSPITAL_COMMUNITY): Payer: Self-pay | Admitting: Student

## 2022-11-07 ENCOUNTER — Ambulatory Visit (HOSPITAL_COMMUNITY)
Admission: RE | Admit: 2022-11-07 | Discharge: 2022-11-07 | Disposition: A | Discharge status: Home / Self Care | Payer: Medicare PPO | Attending: Student | Admitting: Student

## 2022-11-07 ENCOUNTER — Ambulatory Visit (HOSPITAL_BASED_OUTPATIENT_CLINIC_OR_DEPARTMENT_OTHER): Payer: Medicare PPO | Admitting: Anesthesiology

## 2022-11-07 ENCOUNTER — Ambulatory Visit (HOSPITAL_COMMUNITY): Payer: Medicare PPO | Admitting: Anesthesiology

## 2022-11-07 ENCOUNTER — Encounter (HOSPITAL_COMMUNITY)
Admission: RE | Disposition: A | Discharge status: Home / Self Care | Payer: Self-pay | Source: Home / Self Care | Attending: Student

## 2022-11-07 DIAGNOSIS — K219 Gastro-esophageal reflux disease without esophagitis: Secondary | ICD-10-CM | POA: Diagnosis not present

## 2022-11-07 DIAGNOSIS — Z85841 Personal history of malignant neoplasm of brain: Secondary | ICD-10-CM | POA: Diagnosis not present

## 2022-11-07 DIAGNOSIS — J449 Chronic obstructive pulmonary disease, unspecified: Secondary | ICD-10-CM | POA: Insufficient documentation

## 2022-11-07 DIAGNOSIS — C3491 Malignant neoplasm of unspecified part of right bronchus or lung: Secondary | ICD-10-CM | POA: Diagnosis not present

## 2022-11-07 DIAGNOSIS — R911 Solitary pulmonary nodule: Secondary | ICD-10-CM | POA: Diagnosis not present

## 2022-11-07 DIAGNOSIS — Z9981 Dependence on supplemental oxygen: Secondary | ICD-10-CM | POA: Diagnosis not present

## 2022-11-07 DIAGNOSIS — Z48813 Encounter for surgical aftercare following surgery on the respiratory system: Secondary | ICD-10-CM | POA: Diagnosis not present

## 2022-11-07 DIAGNOSIS — R918 Other nonspecific abnormal finding of lung field: Secondary | ICD-10-CM | POA: Diagnosis not present

## 2022-11-07 DIAGNOSIS — C3412 Malignant neoplasm of upper lobe, left bronchus or lung: Secondary | ICD-10-CM | POA: Insufficient documentation

## 2022-11-07 DIAGNOSIS — Z4682 Encounter for fitting and adjustment of non-vascular catheter: Secondary | ICD-10-CM | POA: Diagnosis not present

## 2022-11-07 DIAGNOSIS — M069 Rheumatoid arthritis, unspecified: Secondary | ICD-10-CM | POA: Insufficient documentation

## 2022-11-07 DIAGNOSIS — C3431 Malignant neoplasm of lower lobe, right bronchus or lung: Secondary | ICD-10-CM

## 2022-11-07 DIAGNOSIS — I351 Nonrheumatic aortic (valve) insufficiency: Secondary | ICD-10-CM | POA: Diagnosis not present

## 2022-11-07 DIAGNOSIS — C3490 Malignant neoplasm of unspecified part of unspecified bronchus or lung: Secondary | ICD-10-CM | POA: Diagnosis not present

## 2022-11-07 DIAGNOSIS — Z87891 Personal history of nicotine dependence: Secondary | ICD-10-CM

## 2022-11-07 DIAGNOSIS — D649 Anemia, unspecified: Secondary | ICD-10-CM | POA: Diagnosis not present

## 2022-11-07 HISTORY — PX: BRONCHIAL NEEDLE ASPIRATION BIOPSY: SHX5106

## 2022-11-07 HISTORY — PX: VIDEO BRONCHOSCOPY WITH RADIAL ENDOBRONCHIAL ULTRASOUND: SHX6849

## 2022-11-07 HISTORY — PX: FIDUCIAL MARKER PLACEMENT: SHX6858

## 2022-11-07 HISTORY — DX: Dependence on supplemental oxygen: Z99.81

## 2022-11-07 HISTORY — PX: BRONCHIAL BIOPSY: SHX5109

## 2022-11-07 HISTORY — PX: BRONCHIAL BRUSHINGS: SHX5108

## 2022-11-07 SURGERY — BRONCHOSCOPY, WITH BIOPSY USING ELECTROMAGNETIC NAVIGATION
Anesthesia: General

## 2022-11-07 MED ORDER — LACTATED RINGERS IV SOLN: INTRAVENOUS | Status: DC

## 2022-11-07 MED ORDER — ACETAMINOPHEN 500 MG PO TABS
1000.0000 mg | ORAL_TABLET | Freq: Once | ORAL | Status: AC
  Administered 2022-11-07: 1000 mg via ORAL
  Filled 2022-11-07: qty 2

## 2022-11-07 MED ORDER — DEXAMETHASONE SODIUM PHOSPHATE 10 MG/ML IJ SOLN
INTRAMUSCULAR | Status: DC | PRN
  Administered 2022-11-07: 5 mg via INTRAVENOUS

## 2022-11-07 MED ORDER — ROCURONIUM BROMIDE 10 MG/ML (PF) SYRINGE
PREFILLED_SYRINGE | INTRAVENOUS | Status: DC | PRN
  Administered 2022-11-07: 40 mg via INTRAVENOUS
  Administered 2022-11-07: 10 mg via INTRAVENOUS

## 2022-11-07 MED ORDER — SUGAMMADEX SODIUM 200 MG/2ML IV SOLN
INTRAVENOUS | Status: DC | PRN
  Administered 2022-11-07: 136 mg via INTRAVENOUS

## 2022-11-07 MED ORDER — PHENYLEPHRINE HCL-NACL 20-0.9 MG/250ML-% IV SOLN
INTRAVENOUS | Status: DC | PRN
  Administered 2022-11-07: 30 ug/min via INTRAVENOUS

## 2022-11-07 MED ORDER — FENTANYL CITRATE (PF) 100 MCG/2ML IJ SOLN
INTRAMUSCULAR | Status: DC | PRN
  Administered 2022-11-07: 100 ug via INTRAVENOUS

## 2022-11-07 MED ORDER — MIDAZOLAM HCL (PF) 5 MG/ML IJ SOLN
INTRAMUSCULAR | Status: DC | PRN
  Administered 2022-11-07: 2 mg via INTRAVENOUS

## 2022-11-07 MED ORDER — ONDANSETRON HCL 4 MG/2ML IJ SOLN
INTRAMUSCULAR | Status: DC | PRN
  Administered 2022-11-07: 4 mg via INTRAVENOUS

## 2022-11-07 MED ORDER — CHLORHEXIDINE GLUCONATE 0.12 % MT SOLN
15.0000 mL | Freq: Once | OROMUCOSAL | Status: AC
  Administered 2022-11-07: 15 mL via OROMUCOSAL

## 2022-11-07 MED ORDER — CHLORHEXIDINE GLUCONATE 0.12 % MT SOLN
OROMUCOSAL | Status: AC
  Filled 2022-11-07: qty 15

## 2022-11-07 MED ORDER — PROPOFOL 10 MG/ML IV BOLUS
INTRAVENOUS | Status: DC | PRN
  Administered 2022-11-07: 100 mg via INTRAVENOUS

## 2022-11-07 MED ORDER — PHENYLEPHRINE 80 MCG/ML (10ML) SYRINGE FOR IV PUSH (FOR BLOOD PRESSURE SUPPORT)
PREFILLED_SYRINGE | INTRAVENOUS | Status: DC | PRN
  Administered 2022-11-07: 160 ug via INTRAVENOUS

## 2022-11-07 MED ORDER — MIDAZOLAM HCL (PF) 5 MG/ML IJ SOLN
INTRAMUSCULAR | Status: AC
  Filled 2022-11-07: qty 1

## 2022-11-07 MED ORDER — FENTANYL CITRATE (PF) 100 MCG/2ML IJ SOLN
INTRAMUSCULAR | Status: AC
  Filled 2022-11-07: qty 2

## 2022-11-07 MED ORDER — LIDOCAINE 2% (20 MG/ML) 5 ML SYRINGE
INTRAMUSCULAR | Status: DC | PRN
  Administered 2022-11-07: 60 mg via INTRAVENOUS

## 2022-11-07 SURGICAL SUPPLY — 1 items: Fiducial Marker IMPLANT

## 2022-11-07 NOTE — Anesthesia Postprocedure Evaluation (Signed)
Anesthesia Post Note  Patient: Susan Holder  Procedure(s) Performed: ROBOTIC ASSISTED NAVIGATIONAL BRONCHOSCOPY BRONCHIAL NEEDLE ASPIRATION BIOPSIES VIDEO BRONCHOSCOPY WITH RADIAL ENDOBRONCHIAL ULTRASOUND BRONCHIAL BIOPSIES FIDUCIAL MARKER PLACEMENT BRONCHIAL BRUSHINGS     Patient location during evaluation: PACU Anesthesia Type: General Level of consciousness: sedated and patient cooperative Pain management: pain level controlled Vital Signs Assessment: post-procedure vital signs reviewed and stable Respiratory status: spontaneous breathing Cardiovascular status: stable Anesthetic complications: no   No notable events documented.  Last Vitals:  Vitals:   11/07/22 1015 11/07/22 1030  BP: (!) 115/59 112/61  Pulse: 92 93  Resp: 17 17  Temp:  36.7 C  SpO2: 96% 96%    Last Pain:  Vitals:   11/07/22 0918  TempSrc:   PainSc: 0-No pain                 Lewie Loron

## 2022-11-07 NOTE — Anesthesia Procedure Notes (Signed)
Procedure Name: Intubation Date/Time: 11/07/2022 7:42 AM  Performed by: Zollie Beckers, CRNAPre-anesthesia Checklist: Patient identified, Emergency Drugs available, Suction available and Patient being monitored Patient Re-evaluated:Patient Re-evaluated prior to induction Oxygen Delivery Method: Circle system utilized Preoxygenation: Pre-oxygenation with 100% oxygen Induction Type: IV induction Ventilation: Mask ventilation without difficulty Laryngoscope Size: 3 and Mac Grade View: Grade III Tube type: Oral Tube size: 8.5 mm Number of attempts: 1 Airway Equipment and Method: Stylet Placement Confirmation: ETT inserted through vocal cords under direct vision, positive ETCO2 and breath sounds checked- equal and bilateral Secured at: 22 cm Tube secured with: Tape Dental Injury: Teeth and Oropharynx as per pre-operative assessment

## 2022-11-07 NOTE — Op Note (Signed)
Video Bronchoscopy with Robotic Assisted Bronchoscopic Navigation   Date of Operation: 11/07/2022   Pre-op Diagnosis: Pulmonary nodules  Post-op Diagnosis: Pulmonary nodules  Surgeon: Judithann Graves   Anesthesia: General endotracheal anesthesia  Operation: Flexible video fiberoptic bronchoscopy with robotic assistance and biopsies.  Estimated Blood Loss: Minimal  Complications: None  Indications and History: Susan Holder is a 67 y.o. female with history of stage IV NSCLC with enlarging LUL pulmonary nodule. The risks, benefits, complications, treatment options and expected outcomes were discussed with the patient.  The possibilities of pneumothorax, pneumonia, reaction to medication, pulmonary aspiration, perforation of a viscus, bleeding, failure to diagnose a condition and creating a complication requiring transfusion or operation were discussed with the patient who freely signed the consent.    Description of Procedure: The patient was seen in the Preoperative Area, was examined and was deemed appropriate to proceed.  The patient was taken to [facility] endoscopy room 3, identified as [name] and the procedure verified as Flexible Video Fiberoptic Bronchoscopy.  A Time Out was held and the above information confirmed.   Prior to the date of the procedure a high-resolution CT scan of the chest was performed. Utilizing ION software program a virtual tracheobronchial tree was generated to allow the creation of distinct navigation pathways to the patient's parenchymal abnormalities. After being taken to the operating room general anesthesia was initiated and the patient  was orally intubated. The video fiberoptic bronchoscope was introduced via the endotracheal tube and a general inspection was performed which showed normal right and left lung anatomy, aspiration of the bilateral mainstems was completed to remove any remaining secretions. Robotic catheter inserted into patient's  endotracheal tube.   Target #1 Lingula nodule: The distinct navigation pathways prepared prior to this procedure were then utilized to navigate to patient's lesion identified on CT scan. CIOS imaging was used to aid navigation and confirm ideal location for biopsy. The robotic catheter was secured into place and the vision probe was withdrawn.  Lesion location was approximated using fluoroscopy and radial endobronchial ultrasound for peripheral targeting. Under fluoroscopic guidance transbronchial needle biopsies, and transbronchial forceps biopsies were performed to be sent for cytology and pathology. A fiducial marker was left in place under fluoro.  Target #2 LUL nodule: The distinct navigation pathways prepared prior to this procedure were then utilized to navigate to patient's lesion identified on CT scan. CIOS imaging was used to aid navigation and confirm ideal location for biopsy. The robotic catheter was secured into place and the vision probe was withdrawn.  Lesion location was approximated using fluoroscopy. Under fluoroscopic guidance transbronchial needle brushings, transbronchial needle biopsies, and transbronchial forceps biopsies were performed to be sent for cytology and pathology. A bronchioalveolar lavage was performed in the LUL and sent for cytology.  At the end of the procedure a general airway inspection was performed and there was no evidence of active bleeding. The bronchoscope was removed.  The patient tolerated the procedure well. There was no significant blood loss and there were no obvious complications. A post-procedural chest x-ray is pending.  Samples Target #1: 1. Transbronchial Wang needle biopsies from Lingula nodule 2. Transbronchial forceps biopsies from Lingula nodule 3. Bronchoalveolar lavage from Lingula nodule 4. Endobronchial biopsies from Lingula nodule  Samples Target #2: 1. Transbronchial needle brushings from LUL nodule 2. Transbronchial Wang needle  biopsies from LUL nodule 3. Transbronchial forceps biopsies from LUL nodule 4. Bronchoalveolar lavage from LUL nodule 5. Endobronchial biopsies from LUL nodule  Plans:  The patient will be discharged from the PACU to home when recovered from anesthesia and after chest x-ray is reviewed. We will review the cytology, pathology and microbiology results with the patient when they become available. Outpatient followup will be with Dr. Arbutus Ped.

## 2022-11-07 NOTE — Transfer of Care (Signed)
Immediate Anesthesia Transfer of Care Note  Patient: Susan Holder  Procedure(s) Performed: ROBOTIC ASSISTED NAVIGATIONAL BRONCHOSCOPY BRONCHIAL NEEDLE ASPIRATION BIOPSIES VIDEO BRONCHOSCOPY WITH RADIAL ENDOBRONCHIAL ULTRASOUND BRONCHIAL BIOPSIES FIDUCIAL MARKER PLACEMENT BRONCHIAL BRUSHINGS  Patient Location: PACU  Anesthesia Type:General  Level of Consciousness: awake  Airway & Oxygen Therapy: Patient Spontanous Breathing and Patient connected to nasal cannula oxygen  Post-op Assessment: Report given to RN and Post -op Vital signs reviewed and stable  Post vital signs: Reviewed and stable  Last Vitals:  Vitals Value Taken Time  BP 144/60 11/07/22 0918  Temp    Pulse 102 11/07/22 0921  Resp 22 11/07/22 0921  SpO2 92% 11/07/22 0921  Vitals shown include unfiled device data.  Last Pain:  Vitals:   11/07/22 0602  TempSrc:   PainSc: 0-No pain      Patients Stated Pain Goal: 0 (11/07/22 0602)  Complications: No notable events documented.

## 2022-11-07 NOTE — Interval H&P Note (Signed)
History and Physical Interval Note:  11/07/2022 7:09 AM  Susan Holder  has presented today for surgery, with the diagnosis of lung nodule.  The various methods of treatment have been discussed with the patient and family. After consideration of risks, benefits and other options for treatment, the patient has consented to  Procedure(s): ROBOTIC ASSISTED NAVIGATIONAL BRONCHOSCOPY (N/A) as a surgical intervention.  The patient's history has been reviewed, patient examined, no change in status, stable for surgery.  I have reviewed the patient's chart and labs.  Questions were answered to the patient's satisfaction.     Omar Person

## 2022-11-07 NOTE — Discharge Instructions (Signed)
-   Normal to have cough with small blood clots, bloody streaks for first 1-3 days after bronchoscopy. If coughing up clot size of your thumb or larger then call our office [phone] and if persistent, especially if you're also developing worsening shortness of breath then would plan to head to ED. - Fever is common in the first 1-2 days after bronchoscopy and often is simply your body's reaction to saline exposure in your lung - just take tylenol. - Will call you in 3-5 business days when results become available  

## 2022-11-08 NOTE — Progress Notes (Signed)
[facility] OFFICE PROGRESS NOTE  Susan Gunner, MD [address] [place] [address]  DIAGNOSIS: Metastatic non-small cell lung cancer initially diagnosed as stage IIIA (T3, N1/N2, M0) non-small cell lung cancer, adenocarcinoma presented with large right lower lobe lung mass with extension to the right hilum and subcarinal area diagnosed in July 2019.  She has brain metastasis in October 2020.   Biomarker Findings Tumor Mutational Burden - TMB-Intermediate (6 Muts/Mb) Microsatellite status - MS-Stable Genomic Findings For a complete list of the genes assayed, please refer to the Appendix. NRAS Q61R ARAF amplification STK11 G56W KRAS G13D MYCN amplification MCL1 amplification NKX2-1 amplification - equivocal? TP53 G245V 7 Disease relevant genes with no reportable alterations: EGFR, ALK, BRAF, MET, ERBB2, RET, ROS1   PRIOR THERAPY: 1) Course of concurrent chemoradiation with weekly carboplatin for AUC of 2 and paclitaxel 45 mg/M2.  Status post 7 cycles.  Last dose was giving 01/11/2018. 2) Consolidation treatment with immunotherapy with Imfinzi (Durvalumab) 10 mg/KG every 2 weeks.  First dose February 09, 2018.  Status post 19 cycles. 3) status post stereotactic body radiotherapy to the enlarging right supraclavicular lymphadenopathy under the care of Dr. Mitzi Hansen. 4) SRS to multiple brain metastasis under the care of Dr. Mitzi Hansen. 5) Systemic chemotherapy with carboplatin for AUC of 5, Alimta 500 mg/M2 and Keytruda 200 mg IV every 3 weeks.  First dose 08/16/2019.  Status post 35 cycles.  Starting from cycle #5 the patient is on maintenance treatment with Alimta and Keytruda every 3 weeks. Alimta was reduced to 400 mg/m2 starting from cycle #21.  Starting from cycle #30 her treatment was changed to single agent Keytruda every 3 weeks.  Alimta was discontinued after Sep 04, 2021 secondary to toxicity.  CURRENT THERAPY: Observation   INTERVAL HISTORY: Susan Holder 67 y.o. female returns to the clinic today for a follow-up visit.  The patient was last seen by Dr. Arbutus Ped on 10/16/2022.  At that point in time, she had a repeat CT scan that showed a new nodule concerning for metachonrus bronchogenic carcinoma.  Dr. Arbutus Ped recommended a repeat PET scan.  She also saw Dr. Thora Lance who arranged for bronchoscopy on 11/07/2022.  The patient also recently had a repeat Pleurx catheter placement for her recurrent effusions which she is draining every 3 days which drains about 200 ml. She denies any fever, chills, night sweats, or unexplained weight loss.  Denies any chest pain.  She had some hemoptysis after her bronchoscopy which is improving.  She has a baseline dry cough and dyspnea on exertion for which she is on 2 L of supplemental oxygen at baseline.  She denies any nausea, vomiting, diarrhea, or constipation.  Denies any headache or visual changes.  She is here today for evaluation to review her PET scan results and for a more detailed discussion about her current condition and recommended treatment options.     MEDICAL HISTORY: Past Medical History:  Diagnosis Date   Acid reflux 09/21/2018   Adenocarcinoma of right lung, stage 4 (HCC) 11/17/2017   ADHD (attention deficit hyperactivity disorder)    patient denies this dx as of 03/10/22   Allergic rhinitis 08/22/2013   ANA positive 05/29/2016   Anemia    Aortic atherosclerosis (HCC) 10/23/2020   Brain metastases 03/29/2019   COPD (chronic obstructive pulmonary disease) (HCC)    Coronary artery calcification seen on CT scan 10/23/2020   patient is unaware of this dx as of 03/10/22   DDD (degenerative  disc disease), cervical 05/29/2016   patient denies this dx as of 03/10/22   Dyspnea    oxygen 2L via [place]   Encounter for antineoplastic chemotherapy 11/17/2017   Encounter for antineoplastic immunotherapy 02/01/2018   Goals of care, counseling/discussion 11/17/2017   High risk medication use 05/29/2016    04/29/2016: ==> plq 200 am & 100 qhs(adeq response).   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   Other fatigue 05/29/2016   Oxygen dependent    2L via [place]   Pericardial effusion 10/23/2020   Pneumonia    as a child   Primary malignant neoplasm of bronchus of right lower lobe (HCC) 12/21/2017   Rheumatoid arthritis (HCC)    Rheumatoid arthritis involving multiple sites with positive rheumatoid factor (HCC) 05/29/2016   +RF +ANA +CCP    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   Smoker 05/29/2016   quit 2019   Trigger finger, left ring finger 05/29/2016   Trigger finger, right ring finger 05/29/2016   Vitamin D deficiency 05/29/2016    ALLERGIES:  has No Known Allergies.  MEDICATIONS:  Current Outpatient Medications  Medication Sig Dispense Refill   acetaminophen (TYLENOL) 500 MG tablet Take 1,000 mg by mouth every 6 (six) hours as needed for moderate pain.     ascorbic acid (VITAMIN C) 500 MG tablet Take 500 mg by mouth daily.     Cholecalciferol (VITAMIN D-3) 125 MCG (5000 UT) TABS Take 5,000 Units by mouth daily.     omeprazole (PRILOSEC) 20 MG capsule Take 20 mg by mouth daily.     No current facility-administered medications for this visit.    SURGICAL HISTORY:  Past Surgical History:  Procedure Laterality Date   BRONCHIAL BIOPSY  03/11/2022   Procedure: BRONCHIAL BIOPSIES;  Surgeon: Omar Person, MD;  Location: [facility] ENDOSCOPY;  Service: Pulmonary;;   BRONCHIAL BIOPSY  11/07/2022   Procedure: BRONCHIAL BIOPSIES;  Surgeon: Omar Person, MD;  Location: [facility] ENDOSCOPY;  Service: Pulmonary;;   BRONCHIAL BRUSHINGS  11/07/2022   Procedure: BRONCHIAL BRUSHINGS;  Surgeon: Omar Person, MD;  Location: [facility] ENDOSCOPY;  Service: Pulmonary;;    BRONCHIAL NEEDLE ASPIRATION BIOPSY  11/09/2017   Procedure: BRONCHIAL NEEDLE ASPIRATION BIOPSIES;  Surgeon: Chilton Greathouse, MD;  Location: [facility] ENDOSCOPY;  Service: Cardiopulmonary;;   BRONCHIAL NEEDLE ASPIRATION BIOPSY  11/07/2022   Procedure: BRONCHIAL NEEDLE ASPIRATION BIOPSIES;  Surgeon: Omar Person, MD;  Location: [facility] ENDOSCOPY;  Service: Pulmonary;;   BRONCHIAL WASHINGS  03/11/2022   Procedure: BRONCHIAL WASHINGS;  Surgeon: Omar Person, MD;  Location: [facility] ENDOSCOPY;  Service: Pulmonary;;   CHEST TUBE INSERTION Left 05/13/2021   Procedure: INSERTION PLEURAL DRAINAGE CATHETER;  Surgeon: Corliss Skains, MD;  Location: [facility] OR;  Service: Thoracic;  Laterality: Left;   CHEST TUBE INSERTION Right 03/11/2022   Procedure: INSERTION PLEURAL DRAINAGE CATHETER;  Surgeon: Omar Person, MD;  Location: [facility] ENDOSCOPY;  Service: Pulmonary;  Laterality: Right;  indwelling pleural catheter, w/ cuff   ENDOBRONCHIAL ULTRASOUND Bilateral 11/09/2017   Procedure: ENDOBRONCHIAL ULTRASOUND;  Surgeon: Chilton Greathouse, MD;  Location: [facility] ENDOSCOPY;  Service: Cardiopulmonary;  Laterality: Bilateral;   FIDUCIAL MARKER PLACEMENT  11/07/2022   Procedure: FIDUCIAL MARKER PLACEMENT;  Surgeon: Omar Person,  MD;  Location: [facility] ENDOSCOPY;  Service: Pulmonary;;   NASAL SINUS SURGERY     NASAL SINUS SURGERY     THORACENTESIS Right 02/07/2021   Procedure: THORACENTESIS;  Surgeon: Corliss Skains, MD;  Location: [facility] OR;  Service: Thoracic;  Laterality: Right;   TUBAL LIGATION     VIDEO BRONCHOSCOPY  11/09/2017   Procedure: VIDEO BRONCHOSCOPY;  Surgeon: Chilton Greathouse, MD;  Location: [facility] ENDOSCOPY;  Service: Cardiopulmonary;;   VIDEO BRONCHOSCOPY Left 03/11/2022   Procedure: VIDEO BRONCHOSCOPY WITH FLUORO;  Surgeon: Omar Person, MD;  Location: [facility] ENDOSCOPY;  Service: Pulmonary;  Laterality: Left;   VIDEO BRONCHOSCOPY WITH RADIAL ENDOBRONCHIAL ULTRASOUND  11/07/2022   Procedure: VIDEO BRONCHOSCOPY  WITH RADIAL ENDOBRONCHIAL ULTRASOUND;  Surgeon: Omar Person, MD;  Location: [facility] ENDOSCOPY;  Service: Pulmonary;;   XI ROBOTIC ASSISTED PERICARDIAL WINDOW Left 02/07/2021   Procedure: XI ROBOTIC ASSISTED THORACOSCOPY PERICARDIAL WINDOW;  Surgeon: Corliss Skains, MD;  Location: [facility] OR;  Service: Thoracic;  Laterality: Left;    REVIEW OF SYSTEMS:   Review of Systems  Constitutional: Positive for fatigue. Negative for appetite change, chills, fever and unexpected weight change.  HENT: Negative for mouth sores, nosebleeds, sore throat and trouble swallowing.   Eyes: Negative for eye problems and icterus.  Respiratory: Positive for shortness of breath and cough. Negative for  hemoptysis and wheezing.   Cardiovascular: Negative for chest pain and leg swelling.  Gastrointestinal: Negative for abdominal pain, constipation, diarrhea, nausea and vomiting.  Genitourinary: Negative for bladder incontinence, difficulty urinating, dysuria, frequency and hematuria.   Musculoskeletal: Negative for back pain, gait problem, neck pain and neck stiffness.  Skin: Negative for itching and rash.  Neurological: Negative for dizziness, extremity weakness, gait problem, headaches, light-headedness and seizures.  Hematological: Negative for adenopathy. Does not bruise/bleed easily.  Psychiatric/Behavioral: Negative for confusion, depression and sleep disturbance. The patient is not nervous/anxious.     PHYSICAL EXAMINATION:  Blood pressure (!) 127/57, pulse 99, temperature 98.5 F (36.9 C), temperature source Oral, resp. rate 17, weight 155 lb 6.4 oz (70.5 kg), last menstrual period 04/28/2000, SpO2 100%.  ECOG PERFORMANCE STATUS: 2  Physical Exam  Constitutional: Oriented to person, place, and time and chronically ill appearing, and in no distress. HENT:  Head: Normocephalic and atraumatic.  Mouth/Throat: Oropharynx is clear and moist. No oropharyngeal exudate.  Eyes: Conjunctivae are normal. Right  eye exhibits no discharge. Left eye exhibits no discharge. No scleral icterus.  Neck: Normal range of motion. Neck supple.  Cardiovascular: Normal rate, regular rhythm, normal heart sounds and intact distal pulses.   Pulmonary/Chest: Effort normal and breath sounds normal. No respiratory distress. No wheezes. No rales. On supplemental oxygen Abdominal: Soft. Bowel sounds are normal. Exhibits no distension and no mass. There is no tenderness.  Musculoskeletal: Normal range of motion. Exhibits no edema.  Lymphadenopathy:    No cervical adenopathy.  Neurological: Alert and oriented to person, place, and time. Exhibits muscle wasting. Examined in the wheelchair.  Skin: Skin is warm and dry. No rash noted. Not diaphoretic. No erythema. No pallor.  Psychiatric: Mood, memory and judgment normal.  Vitals reviewed.  LABORATORY DATA: Lab Results  Component Value Date   WBC 7.7 10/14/2022   HGB 10.6 (L) 10/14/2022   HCT 35.3 (L) 10/14/2022   MCV 95.7 10/14/2022   PLT 289 10/14/2022      Chemistry      Component Value Date/Time   NA 139 10/14/2022 1207   K 4.6 10/14/2022 1207  CL 105 10/14/2022 1207   CO2 28 10/14/2022 1207   BUN 22 10/14/2022 1207   CREATININE 1.23 (H) 10/14/2022 1207   CREATININE 0.80 07/24/2017 1438      Component Value Date/Time   CALCIUM 10.1 10/14/2022 1207   ALKPHOS 104 10/14/2022 1207   AST 18 10/14/2022 1207   ALT 8 10/14/2022 1207   BILITOT 0.2 (L) 10/14/2022 1207       RADIOGRAPHIC STUDIES:  NM PET Image Restage (PS) Skull Base to Thigh (F-18 FDG)  Result Date: 11/11/2022 CLINICAL DATA:  Subsequent treatment strategy for non-small cell lung cancer. EXAM: NUCLEAR MEDICINE PET SKULL BASE TO THIGH TECHNIQUE: 7.5 mCi F-18 FDG was injected intravenously. Full-ring PET imaging was performed from the skull base to thigh after the radiotracer. CT data was obtained and used for attenuation correction and anatomic localization. Fasting blood glucose: 95 mg/dl  COMPARISON:  Concurrent CT chest. Prior CT chest abdomen pelvis dated 10/14/2022. FINDINGS: Mediastinal blood pool activity: SUV max 3.1 Liver activity: SUV max NA NECK: No hypermetabolic cervical lymphadenopathy. Incidental CT findings: None. CHEST: Radiation changes in the right hemithorax, without focal hypermetabolism to suggest recurrence. Moderate right pleural effusion with indwelling pleural drain. Scattered foci of hypermetabolism along the course of the drain. Correlate with cytology. Associated rounded atelectasis in the right lower lobe. Irregular posterior left lower lobe nodule, measuring 13 x 8 mm on concurrent CT chest, demonstrating mild hypermetabolism (max SUV 3.7), suspicious for primary bronchogenic carcinoma. Additional scattered subpleural patchy opacities in the left upper lobe favor infection/inflammation given the appearance/distribution, but are mildly progressive from prior CT chest and demonstrate mild hypermetabolism, max SUV 6.2 in the anterior left upper lobe and 4.3 in the lateral aspect of the lingula. As such, tumor cannot be excluded. Dominant opacity in the left upper lobe measures 1.1 x 2.1 cm (series 7/image 35). No hypermetabolic thoracic lymphadenopathy. Incidental CT findings: Atherosclerotic calcifications of the aortic arch. Mild coronary atherosclerosis of the LAD and right circumflex. ABDOMEN/PELVIS: No abnormal hypermetabolism in the liver, spleen, pancreas, or adrenal glands. No hypermetabolic abdominopelvic lymphadenopathy. Incidental CT findings: Mild left colonic diverticulosis, without evidence of diverticulitis. Atherosclerotic calcifications the abdominal aorta and branch vessels. SKELETON: No focal hypermetabolic activity to suggest skeletal metastasis. Incidental CT findings: None. IMPRESSION: 13 mm irregular left upper lobe nodule, suspicious for primary bronchogenic carcinoma. Additional subpleural patchy opacities in the left upper lobe may reflect  infection/inflammation, but tumor is not excluded. Radiation changes in the right hemithorax. Moderate right pleural effusion with indwelling pleural drain. Scattered foci of hypermetabolism along the course of the drain. Correlate with cytology. No evidence of metastatic disease in the abdomen/pelvis. Electronically Signed   By: Charline Bills M.D.   On: 11/11/2022 02:09   CT Super D Chest Wo Contrast  Result Date: 11/11/2022 CLINICAL DATA:  Follow-up non-small cell lung cancer, status post chemotherapy and XRT EXAM: CT CHEST WITHOUT CONTRAST TECHNIQUE: Multidetector CT imaging of the chest was performed using thin slice collimation for electromagnetic bronchoscopy planning purposes, without intravenous contrast. RADIATION DOSE REDUCTION: This exam was performed according to the departmental dose-optimization program which includes automated exposure control, adjustment of the mA and/or kV according to patient size and/or use of iterative reconstruction technique. COMPARISON:  CT chest dated 10/14/2022 FINDINGS: Cardiovascular: The heart is normal in size. No pericardial effusion. No evidence of thoracic aortic aneurysm. Atherosclerotic calcifications of the aortic arch. Mild coronary is of the LAD and right coronary artery. Mediastinum/Nodes: No suspicious mediastinal lymphadenopathy. Visualized thyroid  is unremarkable. Lungs/Pleura: Moderate centrilobular and paraseptal emphysematous changes, upper lung predominant. 13 x 8 mm irregular/spiculated nodule in the posterior left upper lobe with involvement of the left fissure (series 5/image 63), previously 12 x 8 mm, suspicious for primary bronchogenic carcinoma. Additional subpleural patchy opacities in the left upper and lower lobes, favoring post infectious/inflammatory scarring. 5 mm left lower lobe nodule (series 5/image 37), unchanged. Left lower lobe bronchiectasis. Radiation changes in the right perihilar region and right lower lobe. Moderate right  pleural effusion with indwelling pleural drain. No pneumothorax. Upper Abdomen: Visualized upper abdomen is grossly unremarkable, noting vascular calcifications. Musculoskeletal: Degenerative changes of the visualized thoracolumbar spine. IMPRESSION: 13 x 8 mm irregular/spiculated nodule in the posterior left upper lobe, suspicious for primary bronchogenic carcinoma, unchanged. Radiation changes in the right perihilar region and right lower lobe. Moderate right pleural effusion with indwelling pleural drain. Aortic Atherosclerosis (ICD10-I70.0) and Emphysema (ICD10-J43.9). Electronically Signed   By: Charline Bills M.D.   On: 11/11/2022 01:53   DG CHEST PORT 1 VIEW  Result Date: 11/07/2022 CLINICAL DATA:  Post bronchoscopy EXAM: PORTABLE CHEST 1 VIEW COMPARISON:  Head CT 11/06/2022 FINDINGS: Moderate right pleural effusion with tunneled pleural catheter. Volume loss and opacity in the right perihilar lung. A new fiducial marker overlaps the left chest. Blunting at the lateral left costophrenic sulcus. No pneumothorax. IMPRESSION: New fiducial marker. No pneumothorax or other acute finding after bronchoscopy. Electronically Signed   By: Tiburcio Pea M.D.   On: 11/07/2022 10:26   DG C-ARM BRONCHOSCOPY  Result Date: 11/07/2022 C-ARM BRONCHOSCOPY: Fluoroscopy was utilized by the requesting physician.  No radiographic interpretation.   DG C-Arm 1-60 Min-No Report  Result Date: 11/07/2022 Fluoroscopy was utilized by the requesting physician.  No radiographic interpretation.   DG C-Arm 1-60 Min-No Report  Result Date: 11/07/2022 Fluoroscopy was utilized by the requesting physician.  No radiographic interpretation.   CT Abdomen Pelvis W Contrast  Result Date: 10/16/2022 CLINICAL DATA:  Restaging non-small cell lung cancer. * Tracking Code: BO * EXAM: CT CHEST, ABDOMEN, AND PELVIS WITH CONTRAST TECHNIQUE: Multidetector CT imaging of the chest, abdomen and pelvis was performed following the standard  protocol during bolus administration of intravenous contrast. RADIATION DOSE REDUCTION: This exam was performed according to the departmental dose-optimization program which includes automated exposure control, adjustment of the mA and/or kV according to patient size and/or use of iterative reconstruction technique. CONTRAST:  OMNIPAQUE IOHEXOL 300 MG/ML  SOLN COMPARISON:  Prior examinations 06/17/2022 and 02/12/2022. FINDINGS: CT CHEST FINDINGS Cardiovascular: No acute vascular findings are demonstrated. There is atherosclerosis of the aorta, great vessels and coronary arteries. Stable mild cardiomegaly. No significant pericardial fluid. Mediastinum/Nodes: Ill-defined soft tissue thickening posterior to the right hilum measuring up to 1.7 x 1.3 cm on image 21/2 appears similar to the previous study, in part secondary to the azygous vein. There is adjacent chronic esophageal wall thickening which appears unchanged. No enlarging mediastinal, hilar or axillary lymph nodes are seen. The thyroid gland appears unremarkable. Lungs/Pleura: Right-sided PleurX catheter is unchanged in position. The mildly complex pleural effusion is unchanged in volume and appearance with mild residual smooth pleural thickening. No pleural based nodules are identified. There is no pneumothorax or significant left-sided pleural effusion. Underlying diffuse centrilobular and paraseptal emphysema. There has been no significant change in the appearance the right lung with perihilar radiation changes and volume loss. There is an enlarging and increasingly solid spiculated lesion posteriorly in the left upper lobe, measuring 1.2 x 0.8  cm on image 58/4 (previously 0.7 x 0.6 cm, remeasured), worrisome for developing metachronous lung cancer. No other suspicious pulmonary nodules are identified. Patchy ground-glass opacities in the lingula and left lower lobe have minimally increased in the interval. Musculoskeletal/Chest wall: No chest wall  mass or suspicious osseous findings. CT ABDOMEN AND PELVIS FINDINGS Hepatobiliary: The liver is normal in density without suspicious focal abnormality. No evidence of gallstones, gallbladder wall thickening or biliary dilatation. Pancreas: Unremarkable. No pancreatic ductal dilatation or surrounding inflammatory changes. Spleen: Normal in size without focal abnormality. Adrenals/Urinary Tract: Both adrenal glands appear normal. No evidence of urinary tract calculus, suspicious renal lesion or hydronephrosis. There are stable small renal cysts bilaterally for which no follow-up imaging is recommended. The bladder appears unremarkable for its degree of distention. Stomach/Bowel: No enteric contrast administered. The stomach appears unremarkable for its degree of distension. No evidence of bowel wall thickening, distention or surrounding inflammatory change. The appendix appears normal. Prominent stool again noted throughout the colon with moderate diverticular changes distally. Vascular/Lymphatic: There are no enlarged abdominal or pelvic lymph nodes. Aortic and branch vessel atherosclerosis without evidence of aneurysm or large vessel occlusion. Reproductive: Uterus and ovaries appear unremarkable. No adnexal mass. Probable tubal ligation clips bilaterally. Other: No evidence of abdominal wall mass or hernia. No ascites or pneumoperitoneum. Musculoskeletal: No acute or significant osseous findings. IMPRESSION: 1. Enlarging and increasingly solid spiculated lesion posteriorly in the left upper lobe, highly worrisome for developing metachronous lung cancer. Consider further evaluation with PET-CT. 2. No other significant changes identified. No evidence of metastatic disease in the abdomen or pelvis. 3. Mildly increased ground-glass opacities at the left lung base, likely inflammatory or treatment related. 4. Stable appearance of the right lung with perihilar radiation changes and volume loss. Stable mildly complex  right pleural effusion with PleurX catheter in place. 5. Aortic Atherosclerosis (ICD10-I70.0) and Emphysema (ICD10-J43.9). Electronically Signed   By: Carey Bullocks M.D.   On: 10/16/2022 08:50   CT Chest W Contrast  Result Date: 10/16/2022 CLINICAL DATA:  Restaging non-small cell lung cancer. * Tracking Code: BO * EXAM: CT CHEST, ABDOMEN, AND PELVIS WITH CONTRAST TECHNIQUE: Multidetector CT imaging of the chest, abdomen and pelvis was performed following the standard protocol during bolus administration of intravenous contrast. RADIATION DOSE REDUCTION: This exam was performed according to the departmental dose-optimization program which includes automated exposure control, adjustment of the mA and/or kV according to patient size and/or use of iterative reconstruction technique. CONTRAST:  OMNIPAQUE IOHEXOL 300 MG/ML  SOLN COMPARISON:  Prior examinations 06/17/2022 and 02/12/2022. FINDINGS: CT CHEST FINDINGS Cardiovascular: No acute vascular findings are demonstrated. There is atherosclerosis of the aorta, great vessels and coronary arteries. Stable mild cardiomegaly. No significant pericardial fluid. Mediastinum/Nodes: Ill-defined soft tissue thickening posterior to the right hilum measuring up to 1.7 x 1.3 cm on image 21/2 appears similar to the previous study, in part secondary to the azygous vein. There is adjacent chronic esophageal wall thickening which appears unchanged. No enlarging mediastinal, hilar or axillary lymph nodes are seen. The thyroid gland appears unremarkable. Lungs/Pleura: Right-sided PleurX catheter is unchanged in position. The mildly complex pleural effusion is unchanged in volume and appearance with mild residual smooth pleural thickening. No pleural based nodules are identified. There is no pneumothorax or significant left-sided pleural effusion. Underlying diffuse centrilobular and paraseptal emphysema. There has been no significant change in the appearance the right lung  with perihilar radiation changes and volume loss. There is an enlarging and increasingly solid spiculated  lesion posteriorly in the left upper lobe, measuring 1.2 x 0.8 cm on image 58/4 (previously 0.7 x 0.6 cm, remeasured), worrisome for developing metachronous lung cancer. No other suspicious pulmonary nodules are identified. Patchy ground-glass opacities in the lingula and left lower lobe have minimally increased in the interval. Musculoskeletal/Chest wall: No chest wall mass or suspicious osseous findings. CT ABDOMEN AND PELVIS FINDINGS Hepatobiliary: The liver is normal in density without suspicious focal abnormality. No evidence of gallstones, gallbladder wall thickening or biliary dilatation. Pancreas: Unremarkable. No pancreatic ductal dilatation or surrounding inflammatory changes. Spleen: Normal in size without focal abnormality. Adrenals/Urinary Tract: Both adrenal glands appear normal. No evidence of urinary tract calculus, suspicious renal lesion or hydronephrosis. There are stable small renal cysts bilaterally for which no follow-up imaging is recommended. The bladder appears unremarkable for its degree of distention. Stomach/Bowel: No enteric contrast administered. The stomach appears unremarkable for its degree of distension. No evidence of bowel wall thickening, distention or surrounding inflammatory change. The appendix appears normal. Prominent stool again noted throughout the colon with moderate diverticular changes distally. Vascular/Lymphatic: There are no enlarged abdominal or pelvic lymph nodes. Aortic and branch vessel atherosclerosis without evidence of aneurysm or large vessel occlusion. Reproductive: Uterus and ovaries appear unremarkable. No adnexal mass. Probable tubal ligation clips bilaterally. Other: No evidence of abdominal wall mass or hernia. No ascites or pneumoperitoneum. Musculoskeletal: No acute or significant osseous findings. IMPRESSION: 1. Enlarging and increasingly solid  spiculated lesion posteriorly in the left upper lobe, highly worrisome for developing metachronous lung cancer. Consider further evaluation with PET-CT. 2. No other significant changes identified. No evidence of metastatic disease in the abdomen or pelvis. 3. Mildly increased ground-glass opacities at the left lung base, likely inflammatory or treatment related. 4. Stable appearance of the right lung with perihilar radiation changes and volume loss. Stable mildly complex right pleural effusion with PleurX catheter in place. 5. Aortic Atherosclerosis (ICD10-I70.0) and Emphysema (ICD10-J43.9). Electronically Signed   By: Carey Bullocks M.D.   On: 10/16/2022 08:50     ASSESSMENT/PLAN:  This is a very pleasant 67 year old African American female with metastatic non-small cell lung cancer, adenocarcinoma initially diagnosed as IIIA in July 2019. She had evidence of metastatic disease with brain metastases in October 2020. She has no actionable mutations.    She is status post 7 cycles of concurrent chemoradiation with carboplatin for an AUC of 2 and paclitaxel 45 mg/m2. She had a partial response.   She then underwent consolidation immunotherapy with imfinzi. She is status post 18 cycles.   She completed SBRT to the right supraclavicular lymph node.   She had evidence of multiple brain metastases in October 2020. She underwent SRS treatment to these lesions under the care of Dr. Mitzi Hansen.   She then underwent systemic chemotherapy with carboplatin for an AUC of 5, Alimta 500 mg/m2 and Keytruda 200 mg IV every 3 weeks. She is status post 35 cycles. Starting from cycle #5 she started maintenance Alimta and Keytruda. Starting from cycle #21, Dr. Arbutus Ped reduced her alimta to 400 mg/m2 due to fatigue.  Alimta was ultimately discontinued starting from cycle #30 due to persistent anemia/cytopenias despite not having chemotherapy in several weeks. She completed two years of Keytruda in April 2023.   She had been  on observation since May 2023   She recently had a restaging CT scan that showed a a new nodule in the left upper lobe that could represent metachronous lung cancer.   She had a  repeat biopsy that is pending.   The patient was seen with Dr. Arbutus Ped today.  Dr. Arbutus Ped reviewed the PET scan which showed left upper lobe nodule suspicious for primary bronchogenic carcinoma.  We will refer her to radiation oncology for radiation to this area.  Will refer her to Dr. Mitzi Hansen.  Arrange for restaging CT scan in 3 months and see her back a few days later to review the results.  Of course, if the final pathology from her recent bronchoscopy shows a different pathology then non-small cell lung cancer, I will call her with the results and next steps.  The patient was advised to call immediately if she has any concerning symptoms in the interval. The patient voices understanding of current disease status and treatment options and is in agreement with the current care plan. All questions were answered. The patient knows to call the clinic with any problems, questions or concerns. We can certainly see the patient much sooner if necessary    Orders Placed This Encounter  Procedures   Ambulatory referral to Radiation Oncology    Referral Priority:   Routine    Referral Type:   Consultation    Referral Reason:   Specialty Services Required    Requested Specialty:   Radiation Oncology    Number of Visits Requested:   1     Stephanye Finnicum L Melodee Lupe, PA-C 11/11/22  ADDENDUM: Hematology/Oncology Attending: I had a face-to-face encounter with the patient today.  I reviewed her records, lab, scan and recommended her care plan.  This is a very pleasant 67 years old African-American female diagnosed with metastatic non-small cell lung cancer initially as a stage IIIa in July 2019 with evidence of brain metastasis in October 2020.  The patient has no actionable mutations.  She started treatment with a course  of concurrent chemoradiation followed by consolidation treatment with immunotherapy with Imfinzi every 2 weeks status post 19 cycles discontinued after she developed disease progression to the brain.  She is status post SRS to brain metastasis in addition to SBRT to enlarging right supraclavicular lymphadenopathy.  The patient also started systemic chemotherapy initially with carboplatin, Alimta and Keytruda for 4 cycles followed by maintenance treatment completed 2 years of Keytruda and she has been on observation for more than a year now.  The patient has been doing fine but most recent imaging studies showed suspicious enlarging solid spiculated lesion posteriorly in the left upper lobe worrisome for metachronous lung cancer.  The patient underwent bronchoscopy on 11/07/2022 but the final pathology is still pending.  She also had a PET scan on 11/06/2022 and that showed hypermetabolic 1.3 cm irregular left upper lobe nodule suspicious for primary bronchogenic carcinoma but no other clear evidence of metastatic disease in the abdomen or pelvis or lymphadenopathy. I recommended for the patient to see Dr. Mitzi Hansen for consideration of SBRT to the hypermetabolic left upper lobe lung nodule. We will see her back for follow-up visit in 3 months for evaluation and repeat imaging studies. She was advised to call immediately if she has any other concerning symptoms in the interval. The total time spent in the appointment was 30 minutes. Disclaimer: This note was dictated with voice recognition software. Similar sounding words can inadvertently be transcribed and may be missed upon review. Lajuana Matte, MD

## 2022-11-10 ENCOUNTER — Encounter (HOSPITAL_COMMUNITY): Payer: Self-pay | Admitting: Student

## 2022-11-10 NOTE — Addendum Note (Signed)
Addendum  created 11/10/22 1506 by Lewie Loron, MD   Attestation recorded in [place], Intraprocedure Attestations filed

## 2022-11-11 ENCOUNTER — Other Ambulatory Visit: Payer: Self-pay

## 2022-11-11 ENCOUNTER — Inpatient Hospital Stay: Payer: Medicare PPO | Attending: Physician Assistant | Admitting: Physician Assistant

## 2022-11-11 VITALS — BP 127/57 | HR 99 | Temp 98.5°F | Resp 17 | Wt 155.4 lb

## 2022-11-11 DIAGNOSIS — Z9221 Personal history of antineoplastic chemotherapy: Secondary | ICD-10-CM | POA: Insufficient documentation

## 2022-11-11 DIAGNOSIS — C3491 Malignant neoplasm of unspecified part of right bronchus or lung: Secondary | ICD-10-CM

## 2022-11-11 DIAGNOSIS — C7931 Secondary malignant neoplasm of brain: Secondary | ICD-10-CM | POA: Insufficient documentation

## 2022-11-11 DIAGNOSIS — Z79899 Other long term (current) drug therapy: Secondary | ICD-10-CM | POA: Diagnosis not present

## 2022-11-11 DIAGNOSIS — Z9981 Dependence on supplemental oxygen: Secondary | ICD-10-CM | POA: Insufficient documentation

## 2022-11-11 DIAGNOSIS — Z87891 Personal history of nicotine dependence: Secondary | ICD-10-CM | POA: Insufficient documentation

## 2022-11-11 DIAGNOSIS — C770 Secondary and unspecified malignant neoplasm of lymph nodes of head, face and neck: Secondary | ICD-10-CM | POA: Diagnosis not present

## 2022-11-11 DIAGNOSIS — Z923 Personal history of irradiation: Secondary | ICD-10-CM | POA: Insufficient documentation

## 2022-11-11 DIAGNOSIS — C3431 Malignant neoplasm of lower lobe, right bronchus or lung: Secondary | ICD-10-CM | POA: Insufficient documentation

## 2022-11-12 LAB — CYTOLOGY - NON PAP

## 2022-11-12 NOTE — Telephone Encounter (Signed)
I called the patient to review the cytology results which showed non-small cell lung cancer, adenocarcinoma.  I called the patient and let her know the results and that the plan is the same.The plan is to see Dr. Mitzi Hansen and Lysbeth Galas next week as scheduled on 7/25.  We will then see her back in medical oncology in October with a restaging CT scan at that time.  She expressed understanding with the instructions.

## 2022-11-17 NOTE — Progress Notes (Signed)
Radiation Oncology         [phone] ________________________________   Name: Susan Holder MRN: [id number]  Date of Service: 11/20/2022  DOB: 05-22-1955   DIAGNOSIS:   Progressive Metastatic Stage IIIA, cT3N1-2M0 NSCLC, adenocarcinoma of the right lower lobe with brain metastases   PREVIOUS RADIOTHERAPY:   03/09/2019 SRS Treatment: Each site was treated to 20 Gy in a single fraction PTV1 Post Cerebellum 12mm PTV2 Lt Cerebellum 13mm PTV3 Lo Rt Vent 8mm PTV4 Rt Frontal 5mm PTV5 Lt Frontal Operc 5mm PTV6 Lt Sylv 5mm  11/16/2018-12/20/2018: The patient's right supraclavicular nodes were treated to 60 Gy over 25 fractions.   11/30/17-01/11/18:  66 Gy to the right chest and regional nodes over 33 fractions  NARRATIVE: Ms. Gallaga is a well-known 67 y.o.  patient to our service with a history of adenocarcinoma of the right lower lobe.  She was originally diagnosed in the summer 2019 she presented with local regional disease and received chemoradiation.  Recurrence was noted in the summer 2020, and she did receive palliative radiotherapy to the right supraclavicular node. In November 2020 she was found to have 6 lesions in the brain felt to be metastatic for which she underwent SRS treatment in November 2020.    She has been followed with no evidence of disease in the brain.  Her last immunotherapy was given on 09/04/2021. She developed a right effusion which has been managed with a Pleurx catheter since 2023. Of note cytology has been negative for malignancy on the occasions her pleural fluid has been studied in the last two years. We have been following inflammatory changes in the brain at sites of prior SRS, but no additional treatment has been given to the brain. She also recently had a CT chest on 10/14/22 that showed progressive changes in a nodule in the LUL measuring 1.2 cm, previously 7 mm (image 58, series 4). A CT Super D CT scan was performed on 11/06/22 showed a 13 x 8 mm  irregular spiculated nodule in the posterior left upper lobe with involvement of the left fissure, additional there was subpleural patchy opacities in the left upper lobe and lower lobes favoring postinfectious or inflammatory changes, a 5 mm left lower lobe nodule was also seen and felt to be unchanged. PET scan on 11/06/2022 showed hypermetabolic activity with an SUV of 3.7 and the left lower lobe nodule which measured 13 mm.  Scattered subpleural patchy opacities in the left upper lobe favored infection/inflammation, and demonstrated hypermetabolic activity with an SUV of 6.2 in the anterior left upper lobe and 4.3 in the lateral aspect of the lingula.  The dominant opacity in the left upper lobe measured 1.1 x 2.1 cm.  No adenopathy was appreciated.  Biopsies from 11/07/2022 showed no malignant cells in the left upper lobe lavage, or in the left upper lobe fine-needle aspirate.  Brushings were also negative.  A fine-needle aspirate and what was labeled the lingula was also noted and felt to be consistent with adenocarcinoma.  The patient is seen today to consider stereotactic radiation to the left lung.  On review of systems, the patient reports she is doing okay.  She states that she is still short of breath related to poor pleural effusion, she feels that she is able to manage her Pleurx drain well.  Stable drainage is being recorded at home.  No new cough or hemoptysis is noted.  No other complaints are verbalized.  PAST MEDICAL HISTORY:  Past Medical History:  Diagnosis Date   Acid reflux 09/21/2018   Adenocarcinoma of right lung, stage 4 (HCC) 11/17/2017   ADHD (attention deficit hyperactivity disorder)    patient denies this dx as of 03/10/22   Allergic rhinitis 08/22/2013   ANA positive 05/29/2016   Anemia    Aortic atherosclerosis (HCC) 10/23/2020   Brain metastases 03/29/2019   COPD (chronic obstructive pulmonary disease) (HCC)    Coronary artery calcification seen on CT scan 10/23/2020    patient is unaware of this dx as of 03/10/22   DDD (degenerative disc disease), cervical 05/29/2016   patient denies this dx as of 03/10/22   Dyspnea    oxygen 2L via [place]   Encounter for antineoplastic chemotherapy 11/17/2017   Encounter for antineoplastic immunotherapy 02/01/2018   Goals of care, counseling/discussion 11/17/2017   High risk medication use 05/29/2016   04/29/2016: ==> plq 200 am & 100 qhs(adeq response).   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   Other fatigue 05/29/2016   Oxygen dependent    2L via [place]   Pericardial effusion 10/23/2020   Pneumonia    as a child   Primary malignant neoplasm of bronchus of right lower lobe (HCC) 12/21/2017   Rheumatoid arthritis (HCC)    Rheumatoid arthritis involving multiple sites with positive rheumatoid factor (HCC) 05/29/2016   +RF +ANA +CCP    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   Smoker 05/29/2016   quit 2019   Trigger finger, left ring finger 05/29/2016   Trigger finger, right ring finger 05/29/2016   Vitamin D deficiency 05/29/2016    PAST SURGICAL HISTORY: Past Surgical History:  Procedure Laterality Date   BRONCHIAL BIOPSY  03/11/2022   Procedure: BRONCHIAL BIOPSIES;  Surgeon: Omar Person, MD;  Location: [facility] ENDOSCOPY;  Service: Pulmonary;;   BRONCHIAL BIOPSY  11/07/2022   Procedure: BRONCHIAL BIOPSIES;  Surgeon: Omar Person, MD;  Location[facility] ENDOSCOPY;  Service: Pulmonary;;   BRONCHIAL BRUSHINGS  11/07/2022   Procedure: BRONCHIAL BRUSHINGS;  Surgeon: Omar Person, MD;  Location: [facility] ENDOSCOPY;  Service: Pulmonary;;   BRONCHIAL NEEDLE ASPIRATION BIOPSY  11/09/2017   Procedure: BRONCHIAL NEEDLE ASPIRATION BIOPSIES;  Surgeon: Chilton Greathouse, MD;  Location: [facility] ENDOSCOPY;   Service: Cardiopulmonary;;   BRONCHIAL NEEDLE ASPIRATION BIOPSY  11/07/2022   Procedure: BRONCHIAL NEEDLE ASPIRATION BIOPSIES;  Surgeon: Omar Person, MD;  Location: [facility] ENDOSCOPY;  Service: Pulmonary;;   BRONCHIAL WASHINGS  03/11/2022   Procedure: BRONCHIAL WASHINGS;  Surgeon: Omar Person, MD;  Location: [facility] ENDOSCOPY;  Service: Pulmonary;;   CHEST TUBE INSERTION Left 05/13/2021   Procedure: INSERTION PLEURAL DRAINAGE CATHETER;  Surgeon: Corliss Skains, MD;  Location: [facility] OR;  Service: Thoracic;  Laterality: Left;   CHEST TUBE INSERTION Right 03/11/2022   Procedure: INSERTION PLEURAL DRAINAGE CATHETER;  Surgeon: Omar Person, MD;  Location: [facility] ENDOSCOPY;  Service: Pulmonary;  Laterality: Right;  indwelling pleural catheter, w/ cuff   ENDOBRONCHIAL ULTRASOUND Bilateral 11/09/2017   Procedure: ENDOBRONCHIAL ULTRASOUND;  Surgeon: Chilton Greathouse, MD;  Location: [facility] ENDOSCOPY;  Service: Cardiopulmonary;  Laterality: Bilateral;   FIDUCIAL MARKER PLACEMENT  11/07/2022   Procedure: FIDUCIAL MARKER PLACEMENT;  Surgeon: Omar Person, MD;  Location: [facility] ENDOSCOPY;  Service: Pulmonary;;  NASAL SINUS SURGERY     NASAL SINUS SURGERY     THORACENTESIS Right 02/07/2021   Procedure: THORACENTESIS;  Surgeon: Corliss Skains, MD;  Location: [facility] OR;  Service: Thoracic;  Laterality: Right;   TUBAL LIGATION     VIDEO BRONCHOSCOPY  11/09/2017   Procedure: VIDEO BRONCHOSCOPY;  Surgeon: Chilton Greathouse, MD;  Location: [facility] ENDOSCOPY;  Service: Cardiopulmonary;;   VIDEO BRONCHOSCOPY Left 03/11/2022   Procedure: VIDEO BRONCHOSCOPY WITH FLUORO;  Surgeon: Omar Person, MD;  Location: [facility] ENDOSCOPY;  Service: Pulmonary;  Laterality: Left;   VIDEO BRONCHOSCOPY WITH RADIAL ENDOBRONCHIAL ULTRASOUND  11/07/2022   Procedure: VIDEO BRONCHOSCOPY WITH RADIAL ENDOBRONCHIAL ULTRASOUND;  Surgeon: Omar Person, MD;  Location: [facility] ENDOSCOPY;  Service: Pulmonary;;   XI ROBOTIC ASSISTED PERICARDIAL  WINDOW Left 02/07/2021   Procedure: XI ROBOTIC ASSISTED THORACOSCOPY PERICARDIAL WINDOW;  Surgeon: Corliss Skains, MD;  Location: [facility] OR;  Service: Thoracic;  Laterality: Left;    PAST SOCIAL HISTORY:  Social History   Socioeconomic History   Marital status: Divorced    Spouse name: Not on file   Number of children: 1   Years of education: Not on file   Highest education level: Not on file  Occupational History   Not on file  Tobacco Use   Smoking status: Former    Current packs/day: 0.00    Average packs/day: 0.5 packs/day for 45.0 years (22.5 ttl pk-yrs)    Types: Cigarettes    Start date: 09/29/1972    Quit date: 09/29/2017    Years since quitting: 5.1   Smokeless tobacco: Never  Vaping Use   Vaping status: Never Used  Substance and Sexual Activity   Alcohol use: Not Currently   Drug use: No   Sexual activity: Not Currently    Birth control/protection: Post-menopausal  Other Topics Concern   Not on file  Social History Narrative   Not on file   Social Determinants of Health   Financial Resource Strain: Low Risk  (12/03/2021)   Overall Financial Resource Strain (CARDIA)    Difficulty of Paying Living Expenses: Not hard at all  Food Insecurity: No Food Insecurity (11/06/2020)   Hunger Vital Sign    Worried About Running Out of Food in the Last Year: Never true    Ran Out of Food in the Last Year: Never true  Transportation Needs: No Transportation Needs (12/03/2021)   PRAPARE - Administrator, Civil Service (Medical): No    Lack of Transportation (Non-Medical): No  Physical Activity: Inactive (12/03/2021)   Exercise Vital Sign    Days of Exercise per Week: 0 days    Minutes of Exercise per Session: 0 min  Stress: No Stress Concern Present (12/03/2021)   [employer] of Occupational Health - Occupational Stress Questionnaire    Feeling of Stress : Not at all  Social Connections: Unknown (12/03/2021)   Social Connection and Isolation Panel [NHANES]     Frequency of Communication with Friends and Family: More than three times a week    Frequency of Social Gatherings with Friends and Family: Never    Attends Religious Services: More than 4 times per year    Active Member of [employer] or Organizations: Yes    Attends Banker Meetings: More than 4 times per year    Marital Status: Not on file  Intimate Partner Violence: Not At Risk (12/03/2021)   Humiliation, Afraid, Rape, and Kick questionnaire    Fear of Current or Ex-Partner: No  Emotionally Abused: No    Physically Abused: No    Sexually Abused: No  The patient is divorced.  She lives in [place] and her 69 year old granddaughter is also at home with her.  She is a retired Production designer, theatre/television/film for a [employer].  PAST FAMILY HISTORY: Family History  Problem Relation Age of Onset   Stroke Mother    Alzheimer's disease Mother    Heart disease Mother    Emphysema Father    Hypertension Brother    Heart attack Maternal Aunt    Heart failure Maternal Grandmother    Hypertension Paternal Grandmother     MEDICATIONS  Current Outpatient Medications  Medication Sig Dispense Refill   acetaminophen (TYLENOL) 500 MG tablet Take 1,000 mg by mouth every 6 (six) hours as needed for moderate pain.     ascorbic acid (VITAMIN C) 500 MG tablet Take 500 mg by mouth daily.     Cholecalciferol (VITAMIN D-3) 125 MCG (5000 UT) TABS Take 5,000 Units by mouth daily.     omeprazole (PRILOSEC) 20 MG capsule Take 20 mg by mouth daily.     No current facility-administered medications for this visit.    ALLERGIES: No Known Allergies  PHYSICAL EXAM:  Unable to assess due to encounter type.   IMPRESSION/PLAN: 1. Progressive Metastatic Stage IIIA, cT3N1-2M0 NSCLC, adenocarcinoma of the right lower lobe with brain metastases.  The patient's recent imaging indicates concerns for multifocal findings in the left lung.  It is difficult to discern which areas seen on PET, and recent CT scans were  actually the ones sampled.  I will reach out to radiology and pulmonology to see if it can be better elicited as to what sites were actually sampled as it appears that the area in the left lung more centrally as seen and noted on her CT in June is the most concerning.  The patient is open to the idea of additional imaging and/or repeat bronchoscopy to try and gain more certainty on which sites are most concerning and based on this workup the most appropriate treatment to follow.  We did discuss stereotactic radiation to the site or sites involved however if she has multifocal disease, she may need systemic treatment.  She is in agreement with the plan to touch base after discussing her case with radiology and pulmonology and if needed presenting her case to multidisciplinary thoracic oncology conference. 2. Pleural effusion.  She will follow up with Dr. Delton Coombes who will be taking over in her care and we will follow this expectantly.    In a visit lasting 50 minutes, greater than 50% of the time was spent face to face discussing the patient's condition, in preparation for the discussion, and coordinating the patient's care.     Osker Mason, PAC  Addendum 11/21/2022.  I was able to speak with Dr. Ty Hilts in radiology, he is concerned about the lesion seen on image 58 series 4 of her CT scan on 10/14/2022.  He also believes that there is a more centrally located lesion adjacent to this, which could be what was representative in the lingula biopsy.  He recommended that if there is any uncertainty repeating another CT scan in a few weeks time.  I will also reach out to Dr. Delton Coombes next week to see if there is any better way to understand which areas were sampled.    Osker Mason, PAC

## 2022-11-20 ENCOUNTER — Ambulatory Visit
Admission: RE | Admit: 2022-11-20 | Discharge: 2022-11-20 | Disposition: A | Discharge status: Home / Self Care | Payer: Medicare PPO | Source: Ambulatory Visit | Attending: Radiation Oncology | Admitting: Radiation Oncology

## 2022-11-20 ENCOUNTER — Encounter: Payer: Self-pay | Admitting: Nurse Practitioner

## 2022-11-20 ENCOUNTER — Encounter: Payer: Self-pay | Admitting: Radiation Oncology

## 2022-11-20 ENCOUNTER — Ambulatory Visit: Payer: Medicare PPO | Admitting: Nurse Practitioner

## 2022-11-20 ENCOUNTER — Other Ambulatory Visit: Payer: Self-pay

## 2022-11-20 VITALS — BP 137/62 | HR 109 | Temp 97.8°F | Resp 24 | Ht 60.0 in | Wt 153.2 lb

## 2022-11-20 VITALS — BP 116/60 | HR 112 | Ht 60.0 in | Wt 154.6 lb

## 2022-11-20 DIAGNOSIS — J90 Pleural effusion, not elsewhere classified: Secondary | ICD-10-CM | POA: Diagnosis not present

## 2022-11-20 DIAGNOSIS — E559 Vitamin D deficiency, unspecified: Secondary | ICD-10-CM | POA: Insufficient documentation

## 2022-11-20 DIAGNOSIS — J9611 Chronic respiratory failure with hypoxia: Secondary | ICD-10-CM

## 2022-11-20 DIAGNOSIS — Z87891 Personal history of nicotine dependence: Secondary | ICD-10-CM | POA: Insufficient documentation

## 2022-11-20 DIAGNOSIS — J432 Centrilobular emphysema: Secondary | ICD-10-CM

## 2022-11-20 DIAGNOSIS — R0602 Shortness of breath: Secondary | ICD-10-CM | POA: Insufficient documentation

## 2022-11-20 DIAGNOSIS — J910 Malignant pleural effusion: Secondary | ICD-10-CM | POA: Diagnosis not present

## 2022-11-20 DIAGNOSIS — K219 Gastro-esophageal reflux disease without esophagitis: Secondary | ICD-10-CM | POA: Insufficient documentation

## 2022-11-20 DIAGNOSIS — I700 Atherosclerosis of aorta: Secondary | ICD-10-CM | POA: Insufficient documentation

## 2022-11-20 DIAGNOSIS — M069 Rheumatoid arthritis, unspecified: Secondary | ICD-10-CM | POA: Insufficient documentation

## 2022-11-20 DIAGNOSIS — C3431 Malignant neoplasm of lower lobe, right bronchus or lung: Secondary | ICD-10-CM | POA: Diagnosis not present

## 2022-11-20 DIAGNOSIS — M5030 Other cervical disc degeneration, unspecified cervical region: Secondary | ICD-10-CM | POA: Diagnosis not present

## 2022-11-20 DIAGNOSIS — J449 Chronic obstructive pulmonary disease, unspecified: Secondary | ICD-10-CM | POA: Diagnosis not present

## 2022-11-20 DIAGNOSIS — R918 Other nonspecific abnormal finding of lung field: Secondary | ICD-10-CM

## 2022-11-20 DIAGNOSIS — C3491 Malignant neoplasm of unspecified part of right bronchus or lung: Secondary | ICD-10-CM

## 2022-11-20 DIAGNOSIS — C7931 Secondary malignant neoplasm of brain: Secondary | ICD-10-CM | POA: Diagnosis not present

## 2022-11-20 DIAGNOSIS — I3139 Other pericardial effusion (noninflammatory): Secondary | ICD-10-CM | POA: Insufficient documentation

## 2022-11-20 MED ORDER — STIOLTO RESPIMAT 2.5-2.5 MCG/ACT IN AERS: 2.0000 | INHALATION_SPRAY | Freq: Every day | RESPIRATORY_TRACT | 5 refills | Status: DC

## 2022-11-20 NOTE — Progress Notes (Signed)
The proposed treatment discussed in conference is for discussion purpose only and is not a binding recommendation.  The patients have not been physically examined, or presented with their treatment options.  Therefore, final treatment plans cannot be decided.  

## 2022-11-20 NOTE — Assessment & Plan Note (Signed)
Clinical and radiographic stability. She will continue to drain as she has been. Understands proper care/use.

## 2022-11-20 NOTE — Assessment & Plan Note (Addendum)
Extensive treatment. See above

## 2022-11-20 NOTE — Progress Notes (Addendum)
@Patient  ID: Susan Holder, female    DOB: 11-06-1955, 67 y.o.   MRN: [id number]  Chief Complaint  Patient presents with   Follow-up    Bronch 11/07/22 f/u    Referring provider: [name], MD  HPI: 67 year old female, former smoker followed for chronic respiratory failure, emphysema, malignant pleural effusion. She has stage IV adenocarcinoma of right lung with brain mets and followed by Dr. Arbutus Ped. She is a former patient of Dr. Thora Lance and last seen in office 10/23/2022. Past medical history significant for atherosclerosis, GERD, RA, DDD.   TEST/EVENTS:  02/2018 PFT: FVC 104, FEV1 99, ratio 74, TLC 65 11/06/2022 PET: radiation changes in right hemithorax without focal hypermetabolism. Moderate right pleural effusion with indwelling catheter; scattered hypermetabolism along the course of the drain. Associated rounded atelectasis RLL. Irregular LLL nodule, 13x8 mm with mild hypermetabolism. Additional scatted patchy opacities in LUL, favor inflammation given appearance and mildly progressive with mild hypermetabolism in LUL and lateral aspect of lingula. Tumor cannot be excluded. Dominant opacity in LUL measures 1.1x2.1 cm. Atherosclerosis 11/06/2021 bronchoscopy: cytology of lingula with adenocarcinoma   10/23/2022: OV with Dr. Thora Lance. Walked for POC - needs 3 lpm O2 pulsed with exertion. Enlarging spiculated posterior 1.2 cm LUL nodule on CT chest. Draining 300 cc every 2-3 days from PleurX. No change in symptoms. Scheduled for navigational bronch.   11/20/2022: Today - follow up Patient presents today for follow up. She had navigational bronchoscopy with Dr. Thora Lance on 7/12. Lingula biopsy revealed malignant cells consistent with adenocarcinoma. She has an appointment with Dr. Mitzi Hansen today to discuss SBRT. She will have a repeat CT chest in October for restaging and follow up with Dr. Arbutus Ped afterwards. She tells me today that she is feeling relatively the same. Recovered easily after  her bronch. She did have a small amount of hemoptysis for a few days following but this has resolved. Breathing and cough at her baseline. She tends to get short of breath with minimal exertion. She wants to know if there's anything else that can be done for this. She also wants to purchase a POC out of pocket. She was told by her DME that insurance wouldn't cover one given how long she has been on oxygen therapy. She denies any fevers, chills, recurrent hemoptysis, night sweats, anorexia, weight loss. She has never been on any inhalers. She does not have a rescue inhaler. She is still draining her PleurX. Gets about 200 cc every 2-3 days. Site is clean. She understands proper care of this.   No Known Allergies  Immunization History  Administered Date(s) Administered   Influenza, High Dose Seasonal PF 01/22/2019   Influenza,inj,Quad PF,6+ Mos 03/08/2018   Influenza-Unspecified 02/08/2020, 02/16/2022   PFIZER(Purple Top)SARS-COV-2 Vaccination 06/02/2019, 06/23/2019, 12/24/2019    Past Medical History:  Diagnosis Date   Acid reflux 09/21/2018   Adenocarcinoma of right lung, stage 4 (HCC) 11/17/2017   ADHD (attention deficit hyperactivity disorder)    patient denies this dx as of 03/10/22   Allergic rhinitis 08/22/2013   ANA positive 05/29/2016   Anemia    Aortic atherosclerosis (HCC) 10/23/2020   Brain metastases 03/29/2019   COPD (chronic obstructive pulmonary disease) (HCC)    Coronary artery calcification seen on CT scan 10/23/2020   patient is unaware of this dx as of 03/10/22   DDD (degenerative disc disease), cervical 05/29/2016   patient denies this dx as of 03/10/22   Dyspnea    oxygen 2L via [place]  Encounter for antineoplastic chemotherapy 11/17/2017   Encounter for antineoplastic immunotherapy 02/01/2018   Goals of care, counseling/discussion 11/17/2017   High risk medication use 05/29/2016   04/29/2016: ==> plq 200 am & 100 qhs(adeq response).   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) 11/04/2018   Other fatigue 05/29/2016   Oxygen dependent    2L via [place]   Pericardial effusion 10/23/2020   Pneumonia    as a child   Primary malignant neoplasm of bronchus of right lower lobe (HCC) 12/21/2017   Rheumatoid arthritis (HCC)    Rheumatoid arthritis involving multiple sites with positive rheumatoid factor (HCC) 05/29/2016   +RF +ANA +CCP    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   Smoker 05/29/2016   quit 2019   Trigger finger, left ring finger 05/29/2016   Trigger finger, right ring finger 05/29/2016   Vitamin D deficiency 05/29/2016    Tobacco History: Social History   Tobacco Use  Smoking Status Former   Current packs/day: 0.00   Average packs/day: 0.5 packs/day for 45.0 years (22.5 ttl pk-yrs)   Types: Cigarettes   Start date: 09/29/1972   Quit date: 09/29/2017   Years since quitting: 5.1  Smokeless Tobacco Never   Counseling given: Not Answered   Outpatient Medications Prior to Visit  Medication Sig Dispense Refill   acetaminophen (TYLENOL) 500 MG tablet Take 1,000 mg by mouth every 6 (six) hours as needed for moderate pain.     ascorbic acid (VITAMIN C) 500 MG tablet Take 500 mg by mouth daily.     Cholecalciferol (VITAMIN D-3) 125 MCG (5000 UT) TABS Take 5,000 Units by mouth daily.     omeprazole (PRILOSEC) 20 MG capsule Take 20 mg by mouth daily.     No facility-administered medications prior to visit.     Review of Systems:   Constitutional: No weight loss or gain, night sweats, fevers, chills, or lassitude. +fatigue (baseline) HEENT: No headaches, difficulty swallowing, tooth/dental problems, or sore throat. No sneezing, itching, ear ache, nasal congestion, or post nasal drip CV:  No chest pain, orthopnea, PND, swelling in  lower extremities, anasarca, dizziness, palpitations, syncope Resp: +shortness of breath with exertion (baseline); cough (Baseline). No excess mucus or change in color of mucus. No hemoptysis. No wheezing.  No chest wall deformity GI:  No heartburn, indigestion GU: No dysuria, change in color of urine, urgency or frequency.   Skin: No rash, lesions, ulcerations MSK:  No joint pain or swelling.   Neuro: No memory impairment  Psych: No depression or anxiety. Mood stable.     Physical Exam:  BP 116/60   Pulse (!) 112   Ht 5' (1.524 m)   Wt 154 lb 9.6 oz (70.1 kg)   LMP 04/28/2000   SpO2 95%   BMI 30.19 kg/m   GEN: Pleasant, interactive, well-kempt; in no acute distress. HEENT:  Normocephalic and atraumatic. PERRLA. Sclera white. Nasal turbinates pink, moist and patent bilaterally. No rhinorrhea present. Oropharynx pink and moist, without exudate or edema. No lesions, ulcerations, or postnasal drip.  NECK:  Supple w/ fair ROM. No JVD present. Normal carotid impulses w/o bruits. Thyroid symmetrical with no goiter or nodules palpated. No lymphadenopathy.   CV: mild tachycardiac, regular rhythm,  no m/r/g, no peripheral edema. Pulses intact, +2 bilaterally. No cyanosis, pallor or clubbing. PULMONARY:  Unlabored, regular breathing. Diminished bibasilar airflow otherwise clear bilaterally A&P w/o wheezes/rales/rhonchi. No accessory muscle use.  GI: BS present and normoactive. Soft, non-tender to palpation. No organomegaly or masses detected. MSK: No erythema, warmth or tenderness. Cap refil <2 sec all extrem. No deformities or joint swelling noted.  Neuro: A/Ox3. No focal deficits noted.   Skin: Warm, no lesions or rashes. Right indwelling catheter in place with clean, dry and intact dressing  Psych: Normal affect and behavior. Judgement and thought content appropriate.     Lab Results:  CBC    Component Value Date/Time   WBC 7.7 10/14/2022 1207   WBC 6.5 10/30/2021 1428   RBC 3.69  (L) 10/14/2022 1207   HGB 10.6 (L) 10/14/2022 1207   HCT 35.3 (L) 10/14/2022 1207   PLT 289 10/14/2022 1207   MCV 95.7 10/14/2022 1207   MCH 28.7 10/14/2022 1207   MCHC 30.0 10/14/2022 1207   RDW 13.3 10/14/2022 1207   LYMPHSABS 0.8 10/14/2022 1207   MONOABS 1.0 10/14/2022 1207   EOSABS 0.1 10/14/2022 1207   BASOSABS 0.0 10/14/2022 1207    BMET    Component Value Date/Time   NA 139 10/14/2022 1207   K 4.6 10/14/2022 1207   CL 105 10/14/2022 1207   CO2 28 10/14/2022 1207   GLUCOSE 88 10/14/2022 1207   BUN 22 10/14/2022 1207   CREATININE 1.23 (H) 10/14/2022 1207   CREATININE 0.80 07/24/2017 1438   CALCIUM 10.1 10/14/2022 1207   GFRNONAA 48 (L) 10/14/2022 1207   GFRNONAA 78 07/24/2017 1438   GFRAA >60 01/17/2020 0805   GFRAA 91 07/24/2017 1438    BNP No results found for: "BNP"   Imaging:  NM PET Image Restage (PS) Skull Base to Thigh (F-18 FDG)  Result Date: 11/11/2022 CLINICAL DATA:  Subsequent treatment strategy for non-small cell lung cancer. EXAM: NUCLEAR MEDICINE PET SKULL BASE TO THIGH TECHNIQUE: 7.5 mCi F-18 FDG was injected intravenously. Full-ring PET imaging was performed from the skull base to thigh after the radiotracer. CT data was obtained and used for attenuation correction and anatomic localization. Fasting blood glucose: 95 mg/dl COMPARISON:  Concurrent CT chest. Prior CT chest abdomen pelvis dated 10/14/2022. FINDINGS: Mediastinal blood pool activity: SUV max 3.1 Liver activity: SUV max NA NECK: No hypermetabolic cervical lymphadenopathy. Incidental CT findings: None. CHEST: Radiation changes in the right hemithorax, without focal hypermetabolism to suggest recurrence. Moderate right pleural effusion with indwelling pleural drain. Scattered foci of hypermetabolism along the course of the drain. Correlate with cytology. Associated rounded atelectasis in the right lower lobe. Irregular posterior left lower lobe nodule, measuring 13 x 8 mm on concurrent CT chest,  demonstrating mild hypermetabolism (max SUV 3.7), suspicious for primary bronchogenic carcinoma. Additional scattered subpleural patchy opacities in the left upper lobe favor infection/inflammation given the appearance/distribution, but are mildly progressive from prior CT chest and demonstrate mild hypermetabolism, max SUV 6.2 in the anterior left upper lobe and 4.3 in the lateral aspect of the lingula. As such, tumor cannot be excluded. Dominant opacity in the left upper lobe measures 1.1 x 2.1 cm (series 7/image 35). No hypermetabolic thoracic lymphadenopathy. Incidental CT findings: Atherosclerotic calcifications of the aortic arch. Mild coronary atherosclerosis of the LAD and right circumflex. ABDOMEN/PELVIS: No abnormal hypermetabolism in the liver, spleen, pancreas, or adrenal glands. No hypermetabolic abdominopelvic lymphadenopathy. Incidental CT findings: Mild left colonic diverticulosis, without evidence of diverticulitis. Atherosclerotic calcifications  the abdominal aorta and branch vessels. SKELETON: No focal hypermetabolic activity to suggest skeletal metastasis. Incidental CT findings: None. IMPRESSION: 13 mm irregular left upper lobe nodule, suspicious for primary bronchogenic carcinoma. Additional subpleural patchy opacities in the left upper lobe may reflect infection/inflammation, but tumor is not excluded. Radiation changes in the right hemithorax. Moderate right pleural effusion with indwelling pleural drain. Scattered foci of hypermetabolism along the course of the drain. Correlate with cytology. No evidence of metastatic disease in the abdomen/pelvis. Electronically Signed   By: Charline Bills M.D.   On: 11/11/2022 02:09   CT Super D Chest Wo Contrast  Result Date: 11/11/2022 CLINICAL DATA:  Follow-up non-small cell lung cancer, status post chemotherapy and XRT EXAM: CT CHEST WITHOUT CONTRAST TECHNIQUE: Multidetector CT imaging of the chest was performed using thin slice collimation for  electromagnetic bronchoscopy planning purposes, without intravenous contrast. RADIATION DOSE REDUCTION: This exam was performed according to the departmental dose-optimization program which includes automated exposure control, adjustment of the mA and/or kV according to patient size and/or use of iterative reconstruction technique. COMPARISON:  CT chest dated 10/14/2022 FINDINGS: Cardiovascular: The heart is normal in size. No pericardial effusion. No evidence of thoracic aortic aneurysm. Atherosclerotic calcifications of the aortic arch. Mild coronary is of the LAD and right coronary artery. Mediastinum/Nodes: No suspicious mediastinal lymphadenopathy. Visualized thyroid is unremarkable. Lungs/Pleura: Moderate centrilobular and paraseptal emphysematous changes, upper lung predominant. 13 x 8 mm irregular/spiculated nodule in the posterior left upper lobe with involvement of the left fissure (series 5/image 63), previously 12 x 8 mm, suspicious for primary bronchogenic carcinoma. Additional subpleural patchy opacities in the left upper and lower lobes, favoring post infectious/inflammatory scarring. 5 mm left lower lobe nodule (series 5/image 37), unchanged. Left lower lobe bronchiectasis. Radiation changes in the right perihilar region and right lower lobe. Moderate right pleural effusion with indwelling pleural drain. No pneumothorax. Upper Abdomen: Visualized upper abdomen is grossly unremarkable, noting vascular calcifications. Musculoskeletal: Degenerative changes of the visualized thoracolumbar spine. IMPRESSION: 13 x 8 mm irregular/spiculated nodule in the posterior left upper lobe, suspicious for primary bronchogenic carcinoma, unchanged. Radiation changes in the right perihilar region and right lower lobe. Moderate right pleural effusion with indwelling pleural drain. Aortic Atherosclerosis (ICD10-I70.0) and Emphysema (ICD10-J43.9). Electronically Signed   By: Charline Bills M.D.   On: 11/11/2022 01:53    DG CHEST PORT 1 VIEW  Result Date: 11/07/2022 CLINICAL DATA:  Post bronchoscopy EXAM: PORTABLE CHEST 1 VIEW COMPARISON:  Head CT 11/06/2022 FINDINGS: Moderate right pleural effusion with tunneled pleural catheter. Volume loss and opacity in the right perihilar lung. A new fiducial marker overlaps the left chest. Blunting at the lateral left costophrenic sulcus. No pneumothorax. IMPRESSION: New fiducial marker. No pneumothorax or other acute finding after bronchoscopy. Electronically Signed   By: Tiburcio Pea M.D.   On: 11/07/2022 10:26   DG C-ARM BRONCHOSCOPY  Result Date: 11/07/2022 C-ARM BRONCHOSCOPY: Fluoroscopy was utilized by the requesting physician.  No radiographic interpretation.   DG C-Arm 1-60 Min-No Report  Result Date: 11/07/2022 Fluoroscopy was utilized by the requesting physician.  No radiographic interpretation.   DG C-Arm 1-60 Min-No Report  Result Date: 11/07/2022 Fluoroscopy was utilized by the requesting physician.  No radiographic interpretation.    Administration History     None          Latest Ref Rng & Units 03/08/2018   11:01 AM  PFT Results  FVC-Pre L 2.00   FVC-Predicted Pre % 104   FVC-Post L  1.91   FVC-Predicted Post % 99   Pre FEV1/FVC % % 73   Post FEV1/FCV % % 74   FEV1-Pre L 1.47   FEV1-Predicted Pre % 99   FEV1-Post L 1.42   TLC L 2.74   TLC % Predicted % 65   RV % Predicted % 61     No results found for: "NITRICOXIDE"      Assessment & Plan:   Pulmonary nodules Biopsy with cytology consistent with adenocarcinoma of the lingula. PET scan with hypermetabolism in this area and concern for possible tumor. Recovered well post bronch. Appt with Dr. Mitzi Hansen for radiation treatment plan today. Follow up with Dr. Arbutus Ped as scheduled.   Patient Instructions  Albuterol inhaler 2 puffs every 6 hours as needed for shortness of breath or wheezing Start Stiolto 2 puffs daily - I have provided you with samples of this. If it's not  covered, we will find another option Continue supplemental oxygen 2-3 lpm for goal >88-90%. Since insurance will not cover this, I have provided you with a prescription to purchase one out of pocket  Continue to drain your PleurX every 2-3 days  Attend appointment with Dr. Mitzi Hansen today. Your biopsy did show adenocarcinoma of the lingula (area of the left lung)  Follow up with Dr. Arbutus Ped as scheduled  Follow up in 6 weeks with Dr. Delton Coombes or Dr. Tonia Brooms to see how new inhaler is working and establish care. If symptoms do not improve or worsen, please contact office for sooner follow up or seek emergency care.    Centrilobular emphysema (HCC) DOE is likely multifactorial. She did not have formal obstruction on PFTs from 2019 but she does have emphysematous changes on imaging. Will trial her on LABA/LAMA therapy with Stiolto to see if she has any perceived benefit. Provided with SABA for rescue. Action plan in place.   Adenocarcinoma of right lung, stage 4 (HCC) Extensive treatment. See above  Malignant pleural effusion Clinical and radiographic stability. She will continue to drain as she has been. Understands proper care/use.   Chronic respiratory failure with hypoxia (HCC) Stable without increased O2 requirement. Her insurance will not cover POC unit. She was able to maintain on 3 lpm pulsed dose with exertion. She would like to purchase one out of pocket - provided with rx today. Understands to continue using continuous home concentrator when sleeping. Goal >88-90%   I spent 42 minutes of dedicated to the care of this patient on the date of this encounter to include pre-visit review of records, face-to-face time with the patient discussing conditions above, post visit ordering of testing, clinical documentation with the electronic health record, making appropriate referrals as documented, and communicating necessary findings to members of the patients care team.  Noemi Chapel,  NP 11/20/2022  Pt aware and understands NP's role.

## 2022-11-20 NOTE — Progress Notes (Signed)
Nursing interview for Progressive Metastatic Stage IIIA, cT3N1-2M0 NSCLC, adenocarcinoma of the right lower lobe with brain metastases.  Patient identity verified x2.  Patient reports A cough (that's recently improving), and SOB w/ exertion. Patient still has a RT pulmonary drainage tube in place from 02/2022 and is doing well w/ it. No other issues conveyed at this time.  Meaningful use complete.  Vitals- BP 137/62 (BP Location: Right Arm, Patient Position: Sitting, Cuff Size: Normal)   Pulse (!) 109   Temp 97.8 F (36.6 C) (Temporal)   Resp (!) 24   Ht 5' (1.524 m)   Wt 153 lb 3.2 oz (69.5 kg)   LMP 04/28/2000   SpO2 100%   PF (!) 3 L/min   BMI 29.92 kg/m    This concludes the interaction.  Ruel Favors, LPN

## 2022-11-20 NOTE — Patient Instructions (Addendum)
Albuterol inhaler 2 puffs every 6 hours as needed for shortness of breath or wheezing Start Stiolto 2 puffs daily - I have provided you with samples of this. If it's not covered, we will find another option Continue supplemental oxygen 2-3 lpm for goal >88-90%. Since insurance will not cover this, I have provided you with a prescription to purchase one out of pocket  Continue to drain your PleurX every 2-3 days  Attend appointment with Dr. Mitzi Hansen today. Your biopsy did show adenocarcinoma of the lingula (area of the left lung)  Follow up with Dr. Arbutus Ped as scheduled  Follow up in 6 weeks with Dr. Delton Coombes or Dr. Tonia Brooms to see how new inhaler is working and establish care. If symptoms do not improve or worsen, please contact office for sooner follow up or seek emergency care.

## 2022-11-20 NOTE — Assessment & Plan Note (Signed)
DOE is likely multifactorial. She did not have formal obstruction on PFTs from 2019 but she does have emphysematous changes on imaging. Will trial her on LABA/LAMA therapy with Stiolto to see if she has any perceived benefit. Provided with SABA for rescue. Action plan in place.

## 2022-11-20 NOTE — Assessment & Plan Note (Addendum)
Biopsy with cytology consistent with adenocarcinoma of the lingula. PET scan with hypermetabolism in this area and concern for possible tumor. Recovered well post bronch. Appt with Dr. Mitzi Hansen for radiation treatment plan today. Follow up with Dr. Arbutus Ped as scheduled.   Patient Instructions  Albuterol inhaler 2 puffs every 6 hours as needed for shortness of breath or wheezing Start Stiolto 2 puffs daily - I have provided you with samples of this. If it's not covered, we will find another option Continue supplemental oxygen 2-3 lpm for goal >88-90%. Since insurance will not cover this, I have provided you with a prescription to purchase one out of pocket  Continue to drain your PleurX every 2-3 days  Attend appointment with Dr. Mitzi Hansen today. Your biopsy did show adenocarcinoma of the lingula (area of the left lung)  Follow up with Dr. Arbutus Ped as scheduled  Follow up in 6 weeks with Dr. Delton Coombes or Dr. Tonia Brooms to see how new inhaler is working and establish care. If symptoms do not improve or worsen, please contact office for sooner follow up or seek emergency care.

## 2022-11-20 NOTE — Assessment & Plan Note (Signed)
Stable without increased O2 requirement. Her insurance will not cover POC unit. She was able to maintain on 3 lpm pulsed dose with exertion. She would like to purchase one out of pocket - provided with rx today. Understands to continue using continuous home concentrator when sleeping. Goal >88-90%

## 2022-11-25 DIAGNOSIS — C3490 Malignant neoplasm of unspecified part of unspecified bronchus or lung: Secondary | ICD-10-CM | POA: Diagnosis not present

## 2022-12-02 ENCOUNTER — Encounter: Payer: Self-pay | Admitting: Radiation Oncology

## 2022-12-03 ENCOUNTER — Telehealth: Payer: Self-pay | Admitting: Radiation Oncology

## 2022-12-03 NOTE — Telephone Encounter (Signed)
I called and let the patient know we have reviewed her imaging with radiology (Dr. Amil Amen), and also discussed her CT/PET/SuperD and Bronchoscopy biopsy results with Dr. Delton Coombes. Dr. Delton Coombes agrees that the site that was biopsied by Dr. Thora Lance that showed malignancy is in the posterior aspect of the LUL "lingula" (the 1.2 cm site called on the 10/14/22 CT as Image58/Series4) as being adenocarcinoma, and superD imaging from biopsy confirmed this was the site sampled. The far more anterior LUL nodule that was biopsied called LUL biopsy on path was granulomatous change, non malignant.   With this, Dr. Mitzi Hansen feels satisfied that we can move forward with offering SBRT to the 1.2 cm site in the "lingula/posterior LUL" rather than needing further diagnostic imaging.   Markings on lingula/posterior LUL below   Yellow:anterior site biopsied c/w granuloma    The patient is in agreement to proceed with SBRT, and will be contacted by our staff for simulation. We reviewed risks, benefits, short, and long term effects, and she will sign consent at the time of simulation.

## 2022-12-08 ENCOUNTER — Ambulatory Visit (INDEPENDENT_AMBULATORY_CARE_PROVIDER_SITE_OTHER): Payer: Medicare PPO

## 2022-12-08 DIAGNOSIS — Z0000 Encounter for general adult medical examination without abnormal findings: Secondary | ICD-10-CM | POA: Diagnosis not present

## 2022-12-08 NOTE — Patient Instructions (Signed)
Susan Holder , Thank you for taking time to come for your Medicare Wellness Visit. I appreciate your ongoing commitment to your health goals. Please review the following plan we discussed and let me know if I can assist you in the future.   Referrals/Orders/Follow-Ups/Clinician Recommendations: none  This is a list of the screening recommended for you and due dates:  Health Maintenance  Topic Date Due   Pneumonia Vaccine (1 of 2 - PCV) Never done   Hepatitis C Screening  Never done   DTaP/Tdap/Td vaccine (1 - Tdap) Never done   Zoster (Shingles) Vaccine (1 of 2) Never done   Colon Cancer Screening  Never done   Mammogram  Never done   DEXA scan (bone density measurement)  Never done   COVID-19 Vaccine (4 - 2023-24 season) 12/27/2021   Flu Shot  11/27/2022   Medicare Annual Wellness Visit  12/08/2023   HPV Vaccine  Aged Out    Advanced directives: (Copy Requested) Please bring a copy of your health care power of attorney and living will to the office to be added to your chart at your convenience.  Next Medicare Annual Wellness Visit scheduled for next year: No, passes at this time  Preventive Care 45 Years and Older, Female Preventive care refers to lifestyle choices and visits with your health care provider that can promote health and wellness. What does preventive care include? A yearly physical exam. This is also called an annual well check. Dental exams once or twice a year. Routine eye exams. Ask your health care provider how often you should have your eyes checked. Personal lifestyle choices, including: Daily care of your teeth and gums. Regular physical activity. Eating a healthy diet. Avoiding tobacco and drug use. Limiting alcohol use. Practicing safe sex. Taking low-dose aspirin every day. Taking vitamin and mineral supplements as recommended by your health care provider. What happens during an annual well check? The services and screenings done by your health care  provider during your annual well check will depend on your age, overall health, lifestyle risk factors, and family history of disease. Counseling  Your health care provider may ask you questions about your: Alcohol use. Tobacco use. Drug use. Emotional well-being. Home and relationship well-being. Sexual activity. Eating habits. History of falls. Memory and ability to understand (cognition). Work and work Astronomer. Reproductive health. Screening  You may have the following tests or measurements: Height, weight, and BMI. Blood pressure. Lipid and cholesterol levels. These may be checked every 5 years, or more frequently if you are over 71 years old. Skin check. Lung cancer screening. You may have this screening every year starting at age 72 if you have a 30-pack-year history of smoking and currently smoke or have quit within the past 15 years. Fecal occult blood test (FOBT) of the stool. You may have this test every year starting at age 52. Flexible sigmoidoscopy or colonoscopy. You may have a sigmoidoscopy every 5 years or a colonoscopy every 10 years starting at age 36. Hepatitis C blood test. Hepatitis B blood test. Sexually transmitted disease (STD) testing. Diabetes screening. This is done by checking your blood sugar (glucose) after you have not eaten for a while (fasting). You may have this done every 1-3 years. Bone density scan. This is done to screen for osteoporosis. You may have this done starting at age 28. Mammogram. This may be done every 1-2 years. Talk to your health care provider about how often you should have regular mammograms. Talk with  your health care provider about your test results, treatment options, and if necessary, the need for more tests. Vaccines  Your health care provider may recommend certain vaccines, such as: Influenza vaccine. This is recommended every year. Tetanus, diphtheria, and acellular pertussis (Tdap, Td) vaccine. You may need a Td  booster every 10 years. Zoster vaccine. You may need this after age 37. Pneumococcal 13-valent conjugate (PCV13) vaccine. One dose is recommended after age 98. Pneumococcal polysaccharide (PPSV23) vaccine. One dose is recommended after age 52. Talk to your health care provider about which screenings and vaccines you need and how often you need them. This information is not intended to replace advice given to you by your health care provider. Make sure you discuss any questions you have with your health care provider. Document Released: 05/11/2015 Document Revised: 01/02/2016 Document Reviewed: 02/13/2015 Elsevier Interactive Patient Education  2017 [employer].  Fall Prevention in the Home Falls can cause injuries. They can happen to people of all ages. There are many things you can do to make your home safe and to help prevent falls. What can I do on the outside of my home? Regularly fix the edges of walkways and driveways and fix any cracks. Remove anything that might make you trip as you walk through a door, such as a raised step or threshold. Trim any bushes or trees on the path to your home. Use bright outdoor lighting. Clear any walking paths of anything that might make someone trip, such as rocks or tools. Regularly check to see if handrails are loose or broken. Make sure that both sides of any steps have handrails. Any raised decks and porches should have guardrails on the edges. Have any leaves, snow, or ice cleared regularly. Use sand or salt on walking paths during winter. Clean up any spills in your garage right away. This includes oil or grease spills. What can I do in the bathroom? Use night lights. Install grab bars by the toilet and in the tub and shower. Do not use towel bars as grab bars. Use non-skid mats or decals in the tub or shower. If you need to sit down in the shower, use a plastic, non-slip stool. Keep the floor dry. Clean up any water that spills on the floor  as soon as it happens. Remove soap buildup in the tub or shower regularly. Attach bath mats securely with double-sided non-slip rug tape. Do not have throw rugs and other things on the floor that can make you trip. What can I do in the bedroom? Use night lights. Make sure that you have a light by your bed that is easy to reach. Do not use any sheets or blankets that are too big for your bed. They should not hang down onto the floor. Have a firm chair that has side arms. You can use this for support while you get dressed. Do not have throw rugs and other things on the floor that can make you trip. What can I do in the kitchen? Clean up any spills right away. Avoid walking on wet floors. Keep items that you use a lot in easy-to-reach places. If you need to reach something above you, use a strong step stool that has a grab bar. Keep electrical cords out of the way. Do not use floor polish or wax that makes floors slippery. If you must use wax, use non-skid floor wax. Do not have throw rugs and other things on the floor that can make you trip.  What can I do with my stairs? Do not leave any items on the stairs. Make sure that there are handrails on both sides of the stairs and use them. Fix handrails that are broken or loose. Make sure that handrails are as long as the stairways. Check any carpeting to make sure that it is firmly attached to the stairs. Fix any carpet that is loose or worn. Avoid having throw rugs at the top or bottom of the stairs. If you do have throw rugs, attach them to the floor with carpet tape. Make sure that you have a light switch at the top of the stairs and the bottom of the stairs. If you do not have them, ask someone to add them for you. What else can I do to help prevent falls? Wear shoes that: Do not have high heels. Have rubber bottoms. Are comfortable and fit you well. Are closed at the toe. Do not wear sandals. If you use a stepladder: Make sure that it is  fully opened. Do not climb a closed stepladder. Make sure that both sides of the stepladder are locked into place. Ask someone to hold it for you, if possible. Clearly mark and make sure that you can see: Any grab bars or handrails. First and last steps. Where the edge of each step is. Use tools that help you move around (mobility aids) if they are needed. These include: Canes. Walkers. Scooters. Crutches. Turn on the lights when you go into a dark area. Replace any light bulbs as soon as they burn out. Set up your furniture so you have a clear path. Avoid moving your furniture around. If any of your floors are uneven, fix them. If there are any pets around you, be aware of where they are. Review your medicines with your doctor. Some medicines can make you feel dizzy. This can increase your chance of falling. Ask your doctor what other things that you can do to help prevent falls. This information is not intended to replace advice given to you by your health care provider. Make sure you discuss any questions you have with your health care provider. Document Released: 02/08/2009 Document Revised: 09/20/2015 Document Reviewed: 05/19/2014 Elsevier Interactive Patient Education  2017 [employer].

## 2022-12-08 NOTE — Progress Notes (Signed)
Subjective:   Susan Holder is a 67 y.o. female who presents for Medicare Annual (Subsequent) preventive examination.  Visit Complete: Virtual  I connected with  Susan Holder on 12/08/22 by a audio enabled telemedicine application and verified that I am speaking with the correct person using two identifiers.  Patient Location: Home  Provider Location: Office/Clinic  I discussed the limitations of evaluation and management by telemedicine. The patient expressed understanding and agreed to proceed.  Vital Signs: Unable to obtain new vitals due to this being a telehealth visit.  Review of Systems     Cardiac Risk Factors include: advanced age (>70men, >52 women)     Objective:    Today's Vitals   12/08/22 1022  PainSc: 5    There is no height or weight on file to calculate BMI.     12/08/2022   10:26 AM 11/20/2022    2:59 PM 11/07/2022    6:04 AM 10/16/2022   10:01 AM 09/26/2022   11:52 AM 06/30/2022   10:38 AM 06/19/2022    9:09 AM  Advanced Directives  Does Patient Have a Medical Advance Directive? Yes Yes Yes Yes Yes Yes Yes  Type of Estate agent of [place];Living will Living will;Healthcare Power of [employer] Power of [place];Living will Healthcare Power of [place];Living will Living will;Healthcare Power of Attorney Living will;Healthcare Power of [employer] Power of [place];Living will  Copy of Healthcare Power of Attorney in Chart? No - copy requested  No - copy requested    No - copy requested  Would patient like information on creating a medical advance directive?    No - Patient declined       Current Medications (verified) Outpatient Encounter Medications as of 12/08/2022  Medication Sig   acetaminophen (TYLENOL) 500 MG tablet Take 1,000 mg by mouth every 6 (six) hours as needed for moderate pain.   ascorbic acid (VITAMIN C) 500 MG tablet Take 500 mg by mouth daily.   Cholecalciferol (VITAMIN D-3) 125 MCG (5000  UT) TABS Take 5,000 Units by mouth daily.   omeprazole (PRILOSEC) 20 MG capsule Take 20 mg by mouth daily. (Patient not taking: Reported on 12/08/2022)   Tiotropium Bromide-Olodaterol (STIOLTO RESPIMAT) 2.5-2.5 MCG/ACT AERS Inhale 2 puffs into the lungs daily. (Patient not taking: Reported on 12/08/2022)   No facility-administered encounter medications on file as of 12/08/2022.    Allergies (verified) Patient has no known allergies.   History: Past Medical History:  Diagnosis Date   Acid reflux 09/21/2018   Adenocarcinoma of right lung, stage 4 (HCC) 11/17/2017   ADHD (attention deficit hyperactivity disorder)    patient denies this dx as of 03/10/22   Allergic rhinitis 08/22/2013   ANA positive 05/29/2016   Anemia    Aortic atherosclerosis (HCC) 10/23/2020   Brain metastases 03/29/2019   COPD (chronic obstructive pulmonary disease) (HCC)    Coronary artery calcification seen on CT scan 10/23/2020   patient is unaware of this dx as of 03/10/22   DDD (degenerative disc disease), cervical 05/29/2016   patient denies this dx as of 03/10/22   Dyspnea    oxygen 2L via [place]   Encounter for antineoplastic chemotherapy 11/17/2017   Encounter for antineoplastic immunotherapy 02/01/2018   Goals of care, counseling/discussion 11/17/2017   High risk medication use 05/29/2016   04/29/2016: ==> plq 200 am & 100 qhs(adeq response).   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) 11/04/2018   Other fatigue 05/29/2016   Oxygen dependent    2L via    Pericardial effusion 10/23/2020   Pneumonia    as a child   Primary malignant neoplasm of bronchus of right lower lobe (HCC) 12/21/2017   Rheumatoid arthritis (HCC)    Rheumatoid arthritis involving multiple sites with positive rheumatoid  factor (HCC) 05/29/2016   +RF +ANA +CCP    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   Smoker 05/29/2016   quit 2019   Trigger finger, left ring finger 05/29/2016   Trigger finger, right ring finger 05/29/2016   Vitamin D deficiency 05/29/2016   Past Surgical History:  Procedure Laterality Date   BRONCHIAL BIOPSY  03/11/2022   Procedure: BRONCHIAL BIOPSIES;  Surgeon: Omar Person, MD;  Location: [facility] ENDOSCOPY;  Service: Pulmonary;;   BRONCHIAL BIOPSY  11/07/2022   Procedure: BRONCHIAL BIOPSIES;  Surgeon: Omar Person, MD;  Location: [facility] ENDOSCOPY;  Service: Pulmonary;;   BRONCHIAL BRUSHINGS  11/07/2022   Procedure: BRONCHIAL BRUSHINGS;  Surgeon: Omar Person, MD;  Location: [facility] ENDOSCOPY;  Service: Pulmonary;;   BRONCHIAL NEEDLE ASPIRATION BIOPSY  11/09/2017   Procedure: BRONCHIAL NEEDLE ASPIRATION BIOPSIES;  Surgeon: Chilton Greathouse, MD;  Location: [facility] ENDOSCOPY;  Service: Cardiopulmonary;;   BRONCHIAL NEEDLE ASPIRATION BIOPSY  11/07/2022   Procedure: BRONCHIAL NEEDLE ASPIRATION BIOPSIES;  Surgeon: Omar Person, MD;  Location: [facility] ENDOSCOPY;  Service: Pulmonary;;   BRONCHIAL WASHINGS  03/11/2022   Procedure: BRONCHIAL WASHINGS;  Surgeon: Omar Person, MD;  Location: [facility] ENDOSCOPY;  Service: Pulmonary;;   CHEST TUBE INSERTION Left 05/13/2021   Procedure: INSERTION PLEURAL DRAINAGE CATHETER;  Surgeon: Corliss Skains, MD;  Location: [facility] OR;  Service: Thoracic;  Laterality: Left;   CHEST TUBE INSERTION Right 03/11/2022   Procedure: INSERTION PLEURAL DRAINAGE CATHETER;  Surgeon: Omar Person, MD;  Location: [facility] ENDOSCOPY;  Service: Pulmonary;  Laterality: Right;  indwelling pleural catheter, w/ cuff   ENDOBRONCHIAL ULTRASOUND Bilateral 11/09/2017   Procedure: ENDOBRONCHIAL ULTRASOUND;  Surgeon: Chilton Greathouse, MD;  Location: [facility] ENDOSCOPY;  Service: Cardiopulmonary;  Laterality: Bilateral;   FIDUCIAL MARKER PLACEMENT  11/07/2022   Procedure: FIDUCIAL MARKER  PLACEMENT;  Surgeon: Omar Person, MD;  Location: [facility] ENDOSCOPY;  Service: Pulmonary;;   NASAL SINUS SURGERY     NASAL SINUS SURGERY     THORACENTESIS Right 02/07/2021   Procedure: THORACENTESIS;  Surgeon: Corliss Skains, MD;  Location: [facility] OR;  Service: Thoracic;  Laterality: Right;   TUBAL LIGATION     VIDEO BRONCHOSCOPY  11/09/2017   Procedure: VIDEO BRONCHOSCOPY;  Surgeon: Chilton Greathouse, MD;  Location: [facility] ENDOSCOPY;  Service: Cardiopulmonary;;   VIDEO BRONCHOSCOPY Left 03/11/2022   Procedure: VIDEO BRONCHOSCOPY WITH FLUORO;  Surgeon: Omar Person, MD;  Location: [facility] ENDOSCOPY;  Service: Pulmonary;  Laterality: Left;   VIDEO BRONCHOSCOPY WITH RADIAL ENDOBRONCHIAL ULTRASOUND  11/07/2022   Procedure: VIDEO BRONCHOSCOPY WITH RADIAL ENDOBRONCHIAL ULTRASOUND;  Surgeon: Omar Person, MD;  Location: [facility] ENDOSCOPY;  Service: Pulmonary;;   XI ROBOTIC ASSISTED PERICARDIAL WINDOW Left 02/07/2021   Procedure: XI ROBOTIC ASSISTED THORACOSCOPY PERICARDIAL WINDOW;  Surgeon: Corliss Skains, MD;  Location: [facility] OR;  Service: Thoracic;  Laterality: Left;   Family History  Problem Relation Age of Onset   Stroke Mother  Alzheimer's disease Mother    Heart disease Mother    Emphysema Father    Hypertension Brother    Heart attack Maternal Aunt    Heart failure Maternal Grandmother    Hypertension Paternal Grandmother    Social History   Socioeconomic History   Marital status: Divorced    Spouse name: Not on file   Number of children: 1   Years of education: Not on file   Highest education level: Not on file  Occupational History   Not on file  Tobacco Use   Smoking status: Former    Current packs/day: 0.00    Average packs/day: 0.5 packs/day for 45.0 years (22.5 ttl pk-yrs)    Types: Cigarettes    Start date: 09/29/1972    Quit date: 09/29/2017    Years since quitting: 5.1   Smokeless tobacco: Never  Vaping Use   Vaping status: Never Used  Substance and Sexual  Activity   Alcohol use: Not Currently   Drug use: No   Sexual activity: Not Currently    Birth control/protection: Post-menopausal  Other Topics Concern   Not on file  Social History Narrative   Not on file   Social Determinants of Health   Financial Resource Strain: Low Risk  (12/08/2022)   Overall Financial Resource Strain (CARDIA)    Difficulty of Paying Living Expenses: Not hard at all  Food Insecurity: No Food Insecurity (12/08/2022)   Hunger Vital Sign    Worried About Running Out of Food in the Last Year: Never true    Ran Out of Food in the Last Year: Never true  Transportation Needs: No Transportation Needs (12/08/2022)   PRAPARE - Administrator, Civil Service (Medical): No    Lack of Transportation (Non-Medical): No  Physical Activity: Inactive (12/08/2022)   Exercise Vital Sign    Days of Exercise per Week: 0 days    Minutes of Exercise per Session: 0 min  Stress: Stress Concern Present (12/08/2022)   [employer] of Occupational Health - Occupational Stress Questionnaire    Feeling of Stress : To some extent  Social Connections: Socially Isolated (12/08/2022)   Social Connection and Isolation Panel [NHANES]    Frequency of Communication with Friends and Family: More than three times a week    Frequency of Social Gatherings with Friends and Family: More than three times a week    Attends Religious Services: Never    Database administrator or Organizations: No    Attends Engineer, structural: Never    Marital Status: Divorced    Tobacco Counseling Counseling given: Not Answered   Clinical Intake:  Pre-visit preparation completed: Yes  Pain : 0-10 Pain Score: 5  Pain Type: Chronic pain Pain Location: Generalized Pain Descriptors / Indicators: Aching Pain Onset: More than a month ago Pain Frequency: Constant     Nutritional Risks: None Diabetes: No  How often do you need to have someone help you when you read instructions,  pamphlets, or other written materials from your doctor or pharmacy?: 1 - Never  Interpreter Needed?: No  Information entered by :: NAllen LPN   Activities of Daily Living    12/08/2022   10:23 AM  In your present state of health, do you have any difficulty performing the following activities:  Hearing? 0  Vision? 0  Difficulty concentrating or making decisions? 0  Walking or climbing stairs? 0  Dressing or bathing? 0  Doing errands, shopping? 1  Comment does  not drive  Preparing Food and eating ? N  Using the Toilet? N  In the past six months, have you accidently leaked urine? N  Do you have problems with loss of bowel control? N  Managing your Medications? N  Managing your Finances? N  Housekeeping or managing your Housekeeping? N    Patient Care Team: [name], MD as PCP - General (Family Medicine)  Indicate any recent Medical Services you may have received from other than [facility] providers in the past year (date may be approximate).     Assessment:   This is a routine wellness examination for Natha.  Hearing/Vision screen Hearing Screening - Comments:: Denies hearing issues Vision Screening - Comments:: Regular eye exams,   Dietary issues and exercise activities discussed:     Goals Addressed             This Visit's Progress    Patient Stated       12/08/2022, no goals       Depression Screen    12/08/2022   10:27 AM 12/03/2021    1:04 PM 11/19/2021    1:12 PM 05/11/2020   10:05 AM  PHQ 2/9 Scores  PHQ - 2 Score 0 0 0 0  PHQ- 9 Score 0       Fall Risk    12/08/2022   10:26 AM 12/03/2021   12:49 PM  Fall Risk   Falls in the past year? 0 1  Number falls in past yr: 0 0  Injury with Fall? 0 0  Risk for fall due to : No Fall Risks   Follow up Falls prevention discussed Falls evaluation completed;Education provided;Falls prevention discussed    MEDICARE RISK AT HOME:  Medicare Risk at Home - 12/08/22 1026     Any stairs in or around the  home? Yes    If so, are there any without handrails? Yes    Home free of loose throw rugs in walkways, pet beds, electrical cords, etc? Yes    Adequate lighting in your home to reduce risk of falls? Yes    Life alert? No    Use of a cane, walker or w/c? No    Grab bars in the bathroom? No    Shower chair or bench in shower? No    Elevated toilet seat or a handicapped toilet? No             TIMED UP AND GO:  Was the test performed?  No    Cognitive Function:        12/08/2022   10:27 AM 12/03/2021   12:51 PM  6CIT Screen  What Year? 0 points 0 points  What month? 0 points 0 points  What time? 0 points 0 points  Count back from 20 0 points 0 points  Months in reverse 0 points 0 points  Repeat phrase 0 points 0 points  Total Score 0 points 0 points    Immunizations Immunization History  Administered Date(s) Administered   Influenza, High Dose Seasonal PF 01/22/2019   Influenza,inj,Quad PF,6+ Mos 03/08/2018   Influenza-Unspecified 02/08/2020, 02/16/2022   PFIZER(Purple Top)SARS-COV-2 Vaccination 06/02/2019, 06/23/2019, 12/24/2019    TDAP status: Due, Education has been provided regarding the importance of this vaccine. Advised may receive this vaccine at local pharmacy or Health Dept. Aware to provide a copy of the vaccination record if obtained from local pharmacy or Health Dept. Verbalized acceptance and understanding.  Flu Vaccine status: Due, Education has been provided  regarding the importance of this vaccine. Advised may receive this vaccine at local pharmacy or Health Dept. Aware to provide a copy of the vaccination record if obtained from local pharmacy or Health Dept. Verbalized acceptance and understanding.  Pneumococcal vaccine status: Due, Education has been provided regarding the importance of this vaccine. Advised may receive this vaccine at local pharmacy or Health Dept. Aware to provide a copy of the vaccination record if obtained from local pharmacy or  Health Dept. Verbalized acceptance and understanding.  Covid-19 vaccine status: Information provided on how to obtain vaccines.   Qualifies for Shingles Vaccine? Yes   Zostavax completed No   Shingrix Completed?: No.    Education has been provided regarding the importance of this vaccine. Patient has been advised to call insurance company to determine out of pocket expense if they have not yet received this vaccine. Advised may also receive vaccine at local pharmacy or Health Dept. Verbalized acceptance and understanding.  Screening Tests Health Maintenance  Topic Date Due   Pneumonia Vaccine 77+ Years old (1 of 2 - PCV) Never done   Hepatitis C Screening  Never done   DTaP/Tdap/Td (1 - Tdap) Never done   Zoster Vaccines- Shingrix (1 of 2) Never done   Colonoscopy  Never done   MAMMOGRAM  Never done   DEXA SCAN  Never done   COVID-19 Vaccine (4 - 2023-24 season) 12/27/2021   INFLUENZA VACCINE  11/27/2022   Medicare Annual Wellness (AWV)  12/08/2023   HPV VACCINES  Aged Out    Health Maintenance  Health Maintenance Due  Topic Date Due   Pneumonia Vaccine 24+ Years old (1 of 2 - PCV) Never done   Hepatitis C Screening  Never done   DTaP/Tdap/Td (1 - Tdap) Never done   Zoster Vaccines- Shingrix (1 of 2) Never done   Colonoscopy  Never done   MAMMOGRAM  Never done   DEXA SCAN  Never done   COVID-19 Vaccine (4 - 2023-24 season) 12/27/2021   INFLUENZA VACCINE  11/27/2022    Colorectal cancer screening: declines at this time  Mammogram status: declines at this time  Bone Density status: declines at this time  Lung Cancer Screening: (Low Dose CT Chest recommended if Age 80-80 years, 20 pack-year currently smoking OR have quit w/in 15years.) does not qualify.   Lung Cancer Screening Referral: no  Additional Screening:  Hepatitis C Screening: does qualify;   Vision Screening: Recommended annual ophthalmology exams for early detection of glaucoma and other disorders of the  eye. Is the patient up to date with their annual eye exam?  Yes  Who is the provider or what is the name of the office in which the patient attends annual eye exams? Can't remember name If pt is not established with a provider, would they like to be referred to a provider to establish care? No .   Dental Screening: Recommended annual dental exams for proper oral hygiene  Diabetic Foot Exam: n/a  Community Resource Referral / Chronic Care Management: CRR required this visit?  No   CCM required this visit?  No     Plan:     I have personally reviewed and noted the following in the patient's chart:   Medical and social history Use of alcohol, tobacco or illicit drugs  Current medications and supplements including opioid prescriptions. Patient is not currently taking opioid prescriptions. Functional ability and status Nutritional status Physical activity Advanced directives List of other physicians Hospitalizations, surgeries, and ER  visits in previous 12 months Vitals Screenings to include cognitive, depression, and falls Referrals and appointments  In addition, I have reviewed and discussed with patient certain preventive protocols, quality metrics, and best practice recommendations. A written personalized care plan for preventive services as well as general preventive health recommendations were provided to patient.     Barb Merino, LPN   08/11/6061   After Visit Summary: (MyChart) Due to this being a telephonic visit, the after visit summary with patients personalized plan was offered to patient via MyChart   Nurse Notes: none

## 2022-12-10 ENCOUNTER — Other Ambulatory Visit: Payer: Self-pay

## 2022-12-10 ENCOUNTER — Ambulatory Visit
Admission: RE | Admit: 2022-12-10 | Discharge: 2022-12-10 | Disposition: A | Discharge status: Home / Self Care | Payer: Medicare PPO | Source: Ambulatory Visit | Attending: Radiation Oncology | Admitting: Radiation Oncology

## 2022-12-10 DIAGNOSIS — Z87891 Personal history of nicotine dependence: Secondary | ICD-10-CM | POA: Insufficient documentation

## 2022-12-10 DIAGNOSIS — C7931 Secondary malignant neoplasm of brain: Secondary | ICD-10-CM | POA: Diagnosis not present

## 2022-12-10 DIAGNOSIS — Z510 Encounter for antineoplastic radiation therapy: Secondary | ICD-10-CM | POA: Insufficient documentation

## 2022-12-10 DIAGNOSIS — C3431 Malignant neoplasm of lower lobe, right bronchus or lung: Secondary | ICD-10-CM | POA: Insufficient documentation

## 2022-12-10 DIAGNOSIS — C7802 Secondary malignant neoplasm of left lung: Secondary | ICD-10-CM | POA: Diagnosis not present

## 2022-12-16 DIAGNOSIS — J90 Pleural effusion, not elsewhere classified: Secondary | ICD-10-CM | POA: Diagnosis not present

## 2022-12-16 DIAGNOSIS — C7800 Secondary malignant neoplasm of unspecified lung: Secondary | ICD-10-CM | POA: Diagnosis not present

## 2022-12-17 DIAGNOSIS — C7802 Secondary malignant neoplasm of left lung: Secondary | ICD-10-CM | POA: Diagnosis not present

## 2022-12-17 DIAGNOSIS — Z510 Encounter for antineoplastic radiation therapy: Secondary | ICD-10-CM | POA: Diagnosis not present

## 2022-12-17 DIAGNOSIS — Z87891 Personal history of nicotine dependence: Secondary | ICD-10-CM | POA: Diagnosis not present

## 2022-12-17 DIAGNOSIS — C7931 Secondary malignant neoplasm of brain: Secondary | ICD-10-CM | POA: Diagnosis not present

## 2022-12-17 DIAGNOSIS — C3431 Malignant neoplasm of lower lobe, right bronchus or lung: Secondary | ICD-10-CM | POA: Diagnosis not present

## 2022-12-23 ENCOUNTER — Ambulatory Visit: Payer: Medicare PPO | Admitting: Radiation Oncology

## 2022-12-24 ENCOUNTER — Ambulatory Visit: Payer: Medicare PPO | Admitting: Radiation Oncology

## 2022-12-25 ENCOUNTER — Ambulatory Visit: Payer: Medicare PPO | Admitting: Radiation Oncology

## 2022-12-26 ENCOUNTER — Ambulatory Visit: Payer: Medicare PPO | Admitting: Radiation Oncology

## 2022-12-26 DIAGNOSIS — C3490 Malignant neoplasm of unspecified part of unspecified bronchus or lung: Secondary | ICD-10-CM | POA: Diagnosis not present

## 2022-12-30 ENCOUNTER — Ambulatory Visit
Admission: RE | Admit: 2022-12-30 | Discharge: 2022-12-30 | Disposition: A | Discharge status: Home / Self Care | Payer: Medicare PPO | Source: Ambulatory Visit | Attending: Radiation Oncology | Admitting: Radiation Oncology

## 2022-12-30 ENCOUNTER — Ambulatory Visit: Payer: Medicare PPO | Admitting: Radiation Oncology

## 2022-12-30 DIAGNOSIS — C7931 Secondary malignant neoplasm of brain: Secondary | ICD-10-CM

## 2022-12-30 DIAGNOSIS — C801 Malignant (primary) neoplasm, unspecified: Secondary | ICD-10-CM | POA: Diagnosis not present

## 2022-12-30 MED ORDER — GADOPICLENOL 0.5 MMOL/ML IV SOLN
7.0000 mL | Freq: Once | INTRAVENOUS | Status: AC | PRN
  Administered 2022-12-30: 7 mL via INTRAVENOUS

## 2022-12-31 ENCOUNTER — Other Ambulatory Visit: Payer: Self-pay

## 2022-12-31 ENCOUNTER — Ambulatory Visit
Admission: RE | Admit: 2022-12-31 | Discharge: 2022-12-31 | Disposition: A | Discharge status: Home / Self Care | Payer: Medicare PPO | Source: Ambulatory Visit | Attending: Radiation Oncology | Admitting: Radiation Oncology

## 2022-12-31 DIAGNOSIS — Z87891 Personal history of nicotine dependence: Secondary | ICD-10-CM | POA: Diagnosis not present

## 2022-12-31 DIAGNOSIS — C7931 Secondary malignant neoplasm of brain: Secondary | ICD-10-CM | POA: Insufficient documentation

## 2022-12-31 DIAGNOSIS — Z510 Encounter for antineoplastic radiation therapy: Secondary | ICD-10-CM | POA: Insufficient documentation

## 2022-12-31 DIAGNOSIS — C3431 Malignant neoplasm of lower lobe, right bronchus or lung: Secondary | ICD-10-CM | POA: Diagnosis not present

## 2022-12-31 LAB — RAD ONC ARIA SESSION SUMMARY
Course Elapsed Days: 0
Plan Fractions Treated to Date: 1
Plan Prescribed Dose Per Fraction: 18 Gy
Plan Total Fractions Prescribed: 3
Plan Total Prescribed Dose: 54 Gy
Reference Point Dosage Given to Date: 18 Gy
Reference Point Session Dosage Given: 18 Gy
Session Number: 1

## 2023-01-01 ENCOUNTER — Ambulatory Visit: Payer: Medicare PPO | Admitting: Radiation Oncology

## 2023-01-02 ENCOUNTER — Ambulatory Visit
Admission: RE | Admit: 2023-01-02 | Discharge: 2023-01-02 | Disposition: A | Discharge status: Home / Self Care | Payer: Medicare PPO | Source: Ambulatory Visit | Attending: Radiation Oncology | Admitting: Radiation Oncology

## 2023-01-02 ENCOUNTER — Other Ambulatory Visit: Payer: Self-pay

## 2023-01-02 ENCOUNTER — Ambulatory Visit: Admission: RE | Admit: 2023-01-02 | Payer: Medicare PPO | Source: Ambulatory Visit

## 2023-01-02 DIAGNOSIS — C3431 Malignant neoplasm of lower lobe, right bronchus or lung: Secondary | ICD-10-CM | POA: Diagnosis not present

## 2023-01-02 DIAGNOSIS — Z87891 Personal history of nicotine dependence: Secondary | ICD-10-CM | POA: Diagnosis not present

## 2023-01-02 DIAGNOSIS — Z510 Encounter for antineoplastic radiation therapy: Secondary | ICD-10-CM | POA: Diagnosis not present

## 2023-01-02 DIAGNOSIS — C7931 Secondary malignant neoplasm of brain: Secondary | ICD-10-CM | POA: Diagnosis not present

## 2023-01-02 LAB — RAD ONC ARIA SESSION SUMMARY
Course Elapsed Days: 2
Plan Fractions Treated to Date: 2
Plan Prescribed Dose Per Fraction: 18 Gy
Plan Total Fractions Prescribed: 3
Plan Total Prescribed Dose: 54 Gy
Reference Point Dosage Given to Date: 36 Gy
Reference Point Session Dosage Given: 18 Gy
Session Number: 2

## 2023-01-05 ENCOUNTER — Ambulatory Visit: Payer: Medicare PPO

## 2023-01-05 ENCOUNTER — Ambulatory Visit
Admission: RE | Admit: 2023-01-05 | Discharge: 2023-01-05 | Disposition: A | Discharge status: Home / Self Care | Payer: Medicare PPO | Source: Ambulatory Visit | Attending: Radiation Oncology | Admitting: Radiation Oncology

## 2023-01-05 ENCOUNTER — Encounter: Payer: Self-pay | Admitting: Radiation Oncology

## 2023-01-05 ENCOUNTER — Other Ambulatory Visit: Payer: Self-pay

## 2023-01-05 DIAGNOSIS — C7802 Secondary malignant neoplasm of left lung: Secondary | ICD-10-CM | POA: Diagnosis not present

## 2023-01-05 DIAGNOSIS — C3431 Malignant neoplasm of lower lobe, right bronchus or lung: Secondary | ICD-10-CM

## 2023-01-05 DIAGNOSIS — C7931 Secondary malignant neoplasm of brain: Secondary | ICD-10-CM

## 2023-01-05 DIAGNOSIS — M0579 Rheumatoid arthritis with rheumatoid factor of multiple sites without organ or systems involvement: Secondary | ICD-10-CM

## 2023-01-05 DIAGNOSIS — Z87891 Personal history of nicotine dependence: Secondary | ICD-10-CM | POA: Diagnosis not present

## 2023-01-05 DIAGNOSIS — Z510 Encounter for antineoplastic radiation therapy: Secondary | ICD-10-CM | POA: Diagnosis not present

## 2023-01-05 LAB — RAD ONC ARIA SESSION SUMMARY
Course Elapsed Days: 5
Plan Fractions Treated to Date: 3
Plan Prescribed Dose Per Fraction: 18 Gy
Plan Total Fractions Prescribed: 3
Plan Total Prescribed Dose: 54 Gy
Reference Point Dosage Given to Date: 54 Gy
Reference Point Session Dosage Given: 18 Gy
Session Number: 3

## 2023-01-05 MED ORDER — DEXAMETHASONE 4 MG PO TABS: 4.0000 mg | ORAL_TABLET | Freq: Every day | ORAL | 1 refills | Status: DC

## 2023-01-05 NOTE — Progress Notes (Signed)
  Radiation Oncology         ([phone]) [phone] ________________________________  Name: Susan Holder MRN: [id number]  Date: 01/05/2023  DOB: 01-Sep-1955  End of Treatment Note  Diagnosis:   Non-small cell lung cancer     Indication for treatment:  Curative       Radiation treatment dates:   12/31/22-01/05/23  Site/dose:   The tumor in the LUL was treated with a course of stereotactic body radiation treatment. The patient received 54 Gy In 3 fractions at 18 G per fraction.  Narrative: The patient tolerated radiation treatment relatively well.   The patient did not have any signs of acute toxicity during treatment.  Plan: The patient will receive a call in about one month from the radiation oncology department. She will continue follow up with Dr. Arbutus Ped as well as with MRI of the brain in 2 months for continued surveillance.      Osker Mason, PAC

## 2023-01-05 NOTE — Progress Notes (Signed)
Telephone nursing appointment for patient to review most recent scan results. I verified patient's identity x2 and began nursing interview.   Patient reports doing well. No issues conveyed at this time.   Meaningful use complete.   Patient aware of their 2pm-01/05/2023 telephone appointment w/ Laurence Aly PA-C. I left my extension [phone] in case patient needs anything. Patient verbalized understanding. This concludes the nursing interview.   Patient contact [phone]     Ruel Favors, LPN

## 2023-01-05 NOTE — Progress Notes (Signed)
Radiation Oncology         [phone] ________________________________   Outpatient Follow Up- Conducted via telephone at patient request.  I spoke with the patient to conduct this visit via telephone. The patient was notified in advance and was offered an in person or telemedicine meeting to allow for face to face communication but instead preferred to proceed with a telephone visit.   Name: Susan Holder MRN: [id number]  Date of Service: 01/05/2023  DOB: 1956/03/01   DIAGNOSIS:   Progressive Metastatic Stage IIIA, cT3N1-2M0 NSCLC, adenocarcinoma of the right lower lobe with brain metastases   PREVIOUS RADIOTHERAPY:   12/31/22-01/05/23: The LUL target was treated to 54 Gy in 3 fractions with IMRT planning.   03/09/2019 SRS Treatment: Each site was treated to 20 Gy in a single fraction PTV1 Post Cerebellum 12mm PTV2 Lt Cerebellum 13mm PTV3 Lo Rt Vent 8mm PTV4 Rt Frontal 5mm PTV5 Lt Frontal Operc 5mm PTV6 Lt Sylv 5mm  11/16/2018-12/20/2018: The patient's right supraclavicular nodes were treated to 60 Gy over 25 fractions.   11/30/17-01/11/18:  66 Gy to the right chest and regional nodes over 33 fractions  NARRATIVE: Ms. Susan Holder is a well-known 67 y.o.  patient to our service with a history of adenocarcinoma of the right lower lobe.  She was originally diagnosed in the summer 2019 she presented with local regional disease and received chemoradiation.  Recurrence was noted in the summer 2020, and she did receive palliative radiotherapy to the right supraclavicular node. In November 2020 she was found to have 6 lesions in the brain felt to be metastatic for which she underwent SRS treatment in November 2020.    She has been followed with no evidence of disease in the brain.  Her last immunotherapy was given on 09/04/2021. She developed a right effusion which has been managed with a Pleurx catheter since 2023. Of note cytology has been negative for malignancy on the occasions her pleural  fluid has been studied in the last two years. We have been following inflammatory changes in the brain at sites of prior SRS, but no additional treatment has been given to the brain. She also recently had a CT chest on 10/14/22 that showed progressive changes in a nodule in the LUL measuring 1.2 cm, previously 7 mm (image 58, series 4). A CT Super D CT scan was performed on 11/06/22 showed a 13 x 8 mm irregular spiculated nodule in the posterior left upper lobe with involvement of the left fissure, additional there was subpleural patchy opacities in the left upper lobe and lower lobes favoring postinfectious or inflammatory changes, a 5 mm left lower lobe nodule was also seen and felt to be unchanged. PET scan on 11/06/2022 showed hypermetabolic activity with an SUV of 3.7 and the left lower lobe nodule which measured 13 mm.  Scattered subpleural patchy opacities in the left upper lobe favored infection/inflammation, and demonstrated hypermetabolic activity with an SUV of 6.2 in the anterior left upper lobe and 4.3 in the lateral aspect of the lingula.  The dominant opacity in the left upper lobe measured 1.1 x 2.1 cm.  No adenopathy was appreciated.  Biopsies from 11/07/2022 showed no malignant cells in the left upper lobe lavage, or in the left upper lobe fine-needle aspirate.  Brushings were also negative.  A fine-needle aspirate and what was labeled the lingula was also noted and felt to be consistent with adenocarcinoma.  Of note there was much discussion with radiology and pulmonary medicine to  try and identify which areas had been biopsied.  The lingula specimen is best represented and radiologic documentation of the posterior left upper lobe nodule, and the left upper lobe fine-needle aspirate represented the anterior lesion that was resulting with a diagnosis of granuloma.  She went on to complete stereotactic body radiotherapy (SBRT) between 12/31/2022 and 01/05/2023.  Regarding her brain findings, she did  undergo repeat short interval MRI on 12/30/2022 that showed an increase in the left cerebellar target but had previously been 13 mm at the time of treatment, now up to 16 mm with T2 hyperintensity with focal increase adjacent to the fourth ventricle noted and linear enhancement laterally in the left lateral ventricle measuring 10.5 mm, and stable to slight increase in the right frontal lobe lesion 5 mm at the time of treatment in 2020 now measuring up to 9 mm.  Surrounding vasogenic edema had increased slightly.  No focal findings were felt to be a new.  Dr. Mitzi Hansen personally reviewed her films and feels that the areas of increased change still represent inflammatory component in the area where she has been previously treated.  She is contacted by phone to review these results.  On review of systems, the patient reports she is doing okay.  No complaints are verbalized today about her Pleurx site, but she states that it is very difficult to describe but that she does have a change in how she feels in terms of her balance.  It is hard to fully describe this but I believe what she is trying to explain is that she does not feel that her efforts for movement are as coordinated as she means them to be.  She is not complaining of headaches visual or auditory disruptions or seizure activity.  She did have an episode of vertigo/difficulty much more so than what she is currently experiencing but this has not been since July 2023 and was originally felt to be more episodic however she feels that she still has some coordination problems that persist and perhaps more recently have been noticeable.  PAST MEDICAL HISTORY:  Past Medical History:  Diagnosis Date   Acid reflux 09/21/2018   Adenocarcinoma of right lung, stage 4 (HCC) 11/17/2017   ADHD (attention deficit hyperactivity disorder)    patient denies this dx as of 03/10/22   Allergic rhinitis 08/22/2013   ANA positive 05/29/2016   Anemia    Aortic atherosclerosis  (HCC) 10/23/2020   Brain metastases 03/29/2019   COPD (chronic obstructive pulmonary disease) (HCC)    Coronary artery calcification seen on CT scan 10/23/2020   patient is unaware of this dx as of 03/10/22   DDD (degenerative disc disease), cervical 05/29/2016   patient denies this dx as of 03/10/22   Dyspnea    oxygen 2L via [place]   Encounter for antineoplastic chemotherapy 11/17/2017   Encounter for antineoplastic immunotherapy 02/01/2018   Goals of care, counseling/discussion 11/17/2017   High risk medication use 05/29/2016   04/29/2016: ==> plq 200 am & 100 qhs(adeq response).   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   Other fatigue 05/29/2016   Oxygen dependent    2L via [place]   Pericardial effusion 10/23/2020   Pneumonia  as a child   Primary malignant neoplasm of bronchus of right lower lobe (HCC) 12/21/2017   Rheumatoid arthritis (HCC)    Rheumatoid arthritis involving multiple sites with positive rheumatoid factor (HCC) 05/29/2016   +RF +ANA +CCP    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   Smoker 05/29/2016   quit 2019   Trigger finger, left ring finger 05/29/2016   Trigger finger, right ring finger 05/29/2016   Vitamin D deficiency 05/29/2016    PAST SURGICAL HISTORY: Past Surgical History:  Procedure Laterality Date   BRONCHIAL BIOPSY  03/11/2022   Procedure: BRONCHIAL BIOPSIES;  Surgeon: Omar Person, MD;  Location: [facility] ENDOSCOPY;  Service: Pulmonary;;   BRONCHIAL BIOPSY  11/07/2022   Procedure: BRONCHIAL BIOPSIES;  Surgeon: Omar Person, MD;  Location: [facility] ENDOSCOPY;  Service: Pulmonary;;   BRONCHIAL BRUSHINGS  11/07/2022   Procedure: BRONCHIAL BRUSHINGS;  Surgeon: Omar Person, MD;  Location[facility] ENDOSCOPY;  Service:  Pulmonary;;   BRONCHIAL NEEDLE ASPIRATION BIOPSY  11/09/2017   Procedure: BRONCHIAL NEEDLE ASPIRATION BIOPSIES;  Surgeon: Chilton Greathouse, MD;  Location: [facility] ENDOSCOPY;  Service: Cardiopulmonary;;   BRONCHIAL NEEDLE ASPIRATION BIOPSY  11/07/2022   Procedure: BRONCHIAL NEEDLE ASPIRATION BIOPSIES;  Surgeon: Omar Person, MD;  Location: [facility] ENDOSCOPY;  Service: Pulmonary;;   BRONCHIAL WASHINGS  03/11/2022   Procedure: BRONCHIAL WASHINGS;  Surgeon: Omar Person, MD;  Location: [facility] ENDOSCOPY;  Service: Pulmonary;;   CHEST TUBE INSERTION Left 05/13/2021   Procedure: INSERTION PLEURAL DRAINAGE CATHETER;  Surgeon: Corliss Skains, MD;  Location: [facility] OR;  Service: Thoracic;  Laterality: Left;   CHEST TUBE INSERTION Right 03/11/2022   Procedure: INSERTION PLEURAL DRAINAGE CATHETER;  Surgeon: Omar Person, MD;  Location: [name] ENDOSCOPY;  Service: Pulmonary;  Laterality: Right;  indwelling pleural catheter, w/ cuff   ENDOBRONCHIAL ULTRASOUND Bilateral 11/09/2017   Procedure: ENDOBRONCHIAL ULTRASOUND;  Surgeon: Chilton Greathouse, MD;  Location: [facility] ENDOSCOPY;  Service: Cardiopulmonary;  Laterality: Bilateral;   FIDUCIAL MARKER PLACEMENT  11/07/2022   Procedure: FIDUCIAL MARKER PLACEMENT;  Surgeon: Omar Person, MD;  Location: [facility] ENDOSCOPY;  Service: Pulmonary;;   NASAL SINUS SURGERY     NASAL SINUS SURGERY     THORACENTESIS Right 02/07/2021   Procedure: THORACENTESIS;  Surgeon: Corliss Skains, MD;  Location: [facility] OR;  Service: Thoracic;  Laterality: Right;   TUBAL LIGATION     VIDEO BRONCHOSCOPY  11/09/2017   Procedure: VIDEO BRONCHOSCOPY;  Surgeon: Chilton Greathouse, MD;  Location: [facility] ENDOSCOPY;  Service: Cardiopulmonary;;   VIDEO BRONCHOSCOPY Left 03/11/2022   Procedure: VIDEO BRONCHOSCOPY WITH FLUORO;  Surgeon: Omar Person, MD;  Location: [facility] ENDOSCOPY;  Service: Pulmonary;  Laterality: Left;   VIDEO BRONCHOSCOPY WITH RADIAL ENDOBRONCHIAL ULTRASOUND  11/07/2022   Procedure: VIDEO  BRONCHOSCOPY WITH RADIAL ENDOBRONCHIAL ULTRASOUND;  Surgeon: Omar Person, MD;  Location: [facility] ENDOSCOPY;  Service: Pulmonary;;   XI ROBOTIC ASSISTED PERICARDIAL WINDOW Left 02/07/2021   Procedure: XI ROBOTIC ASSISTED THORACOSCOPY PERICARDIAL WINDOW;  Surgeon: Corliss Skains, MD;  Location: [facility] OR;  Service: Thoracic;  Laterality: Left;    PAST SOCIAL HISTORY:  Social History   Socioeconomic History   Marital status: Divorced    Spouse name: Not on file   Number of children: 1   Years of education: Not on file   Highest education level: Not on file  Occupational History   Not on file  Tobacco Use   Smoking status: Former    Current packs/day: 0.00    Average packs/day:  0.5 packs/day for 45.0 years (22.5 ttl pk-yrs)    Types: Cigarettes    Start date: 09/29/1972    Quit date: 09/29/2017    Years since quitting: 5.2   Smokeless tobacco: Never  Vaping Use   Vaping status: Never Used  Substance and Sexual Activity   Alcohol use: Not Currently   Drug use: No   Sexual activity: Not Currently    Birth control/protection: Post-menopausal  Other Topics Concern   Not on file  Social History Narrative   Not on file   Social Determinants of Health   Financial Resource Strain: Low Risk  (12/08/2022)   Overall Financial Resource Strain (CARDIA)    Difficulty of Paying Living Expenses: Not hard at all  Food Insecurity: No Food Insecurity (12/08/2022)   Hunger Vital Sign    Worried About Running Out of Food in the Last Year: Never true    Ran Out of Food in the Last Year: Never true  Transportation Needs: No Transportation Needs (12/08/2022)   PRAPARE - Administrator, Civil Service (Medical): No    Lack of Transportation (Non-Medical): No  Physical Activity: Inactive (12/08/2022)   Exercise Vital Sign    Days of Exercise per Week: 0 days    Minutes of Exercise per Session: 0 min  Stress: Stress Concern Present (12/08/2022)   [employer] of Occupational Health  - Occupational Stress Questionnaire    Feeling of Stress : To some extent  Social Connections: Socially Isolated (12/08/2022)   Social Connection and Isolation Panel [NHANES]    Frequency of Communication with Friends and Family: More than three times a week    Frequency of Social Gatherings with Friends and Family: More than three times a week    Attends Religious Services: Never    Database administrator or Organizations: No    Attends Banker Meetings: Never    Marital Status: Divorced  Catering manager Violence: Not At Risk (12/08/2022)   Humiliation, Afraid, Rape, and Kick questionnaire    Fear of Current or Ex-Partner: No    Emotionally Abused: No    Physically Abused: No    Sexually Abused: No  The patient is divorced.  She lives in [place] and her 35 year old granddaughter is also at home with her.  She is a retired Production designer, theatre/television/film for a [employer].  PAST FAMILY HISTORY: Family History  Problem Relation Age of Onset   Stroke Mother    Alzheimer's disease Mother    Heart disease Mother    Emphysema Father    Hypertension Brother    Heart attack Maternal Aunt    Heart failure Maternal Grandmother    Hypertension Paternal Grandmother     MEDICATIONS  Current Outpatient Medications  Medication Sig Dispense Refill   dexamethasone (DECADRON) 4 MG tablet Take 1 tablet (4 mg total) by mouth daily. 30 tablet 1   acetaminophen (TYLENOL) 500 MG tablet Take 1,000 mg by mouth every 6 (six) hours as needed for moderate pain.     ascorbic acid (VITAMIN C) 500 MG tablet Take 500 mg by mouth daily.     Cholecalciferol (VITAMIN D-3) 125 MCG (5000 UT) TABS Take 5,000 Units by mouth daily.     omeprazole (PRILOSEC) 20 MG capsule Take 20 mg by mouth daily. (Patient not taking: Reported on 12/08/2022)     Tiotropium Bromide-Olodaterol (STIOLTO RESPIMAT) 2.5-2.5 MCG/ACT AERS Inhale 2 puffs into the lungs daily. (Patient not taking: Reported on 12/08/2022) 4 g  5   No current  facility-administered medications for this encounter.    ALLERGIES: No Known Allergies  PHYSICAL EXAM:  Unable to assess due to encounter type.   IMPRESSION/PLAN: 1. Progressive Metastatic Stage IIIA, cT3N1-2M0 NSCLC, adenocarcinoma of the right lower lobe with brain metastases.  The patient's MRI results have been reviewed.  Dr. Mitzi Hansen believes the findings to be consistent with inflammatory component from her prior [facility] treatment.  Since the patient is having some vague complaints, we discussed considering a low-dose of dexamethasone at 4 mg p.o. daily to see if this is beneficial for her symptoms and short interval repeat.  We will check in with her in a few days to see if steroids have improved her symptoms.  We discussed the side effect profile of the medication, in which she was encouraged to continue taking her proton pump inhibitor. 2. Pleural effusion.  She will follow up with Dr. Delton Coombes and we will follow this expectantly.     This encounter was conducted via telephone.  The patient has provided two factor identification and has given verbal consent for this type of encounter and has been advised to only accept a meeting of this type in a secure network environment. The time spent during this encounter was 35 minutes including preparation, discussion, and coordination of the patient's care. The attendants for this meeting include Ronny Bacon  and [name].  During the encounter,  Ronny Bacon was located remotely ASHLIEGH FACEY was located at home.       Osker Mason, PAC

## 2023-01-07 ENCOUNTER — Ambulatory Visit: Payer: Medicare PPO | Admitting: Radiation Oncology

## 2023-01-07 NOTE — Radiation Completion Notes (Signed)
Patient Name: Susan Holder, Susan Holder MRN: [id number] Date of Birth: 04-Jan-1956 Referring Physician: Si Gaul, M.D. Date of Service: 2023-01-07 Radiation Oncologist: Dorothy Puffer, M.D. [facility]                             RADIATION ONCOLOGY END OF TREATMENT NOTE     Diagnosis: C34.31 Malignant neoplasm of lower lobe, right bronchus or lung Staging on 2020-06-11: Adenocarcinoma of right lung, stage 4 (HCC) T=cT3, N=cN2, M=cM1c Staging on 2020-06-11: Primary malignant neoplasm of bronchus of right lower lobe (HCC) T=cT3, N=cN2, M=cM1c Intent: Curative     ==========DELIVERED PLANS==========  First Treatment Date: 2022-12-31 - Last Treatment Date: 2023-01-05   Plan Name: Lung_L_SBRT Site: Lung, Left Technique: SBRT/SRT-IMRT Mode: Photon Dose Per Fraction: 18 Gy Prescribed Dose (Delivered / Prescribed): 54 Gy / 54 Gy Prescribed Fxs (Delivered / Prescribed): 3 / 3     ==========ON TREATMENT VISIT DATES========== 2022-12-31, 2023-01-02, 2023-01-02, 2023-01-05     ==========UPCOMING VISITS==========       ==========APPENDIX - ON TREATMENT VISIT NOTES==========   See weekly On Treatment Notes in Epic for details.

## 2023-01-08 ENCOUNTER — Other Ambulatory Visit: Payer: Self-pay | Admitting: Radiation Therapy

## 2023-01-09 ENCOUNTER — Ambulatory Visit: Payer: Medicare PPO | Admitting: Radiation Oncology

## 2023-01-12 ENCOUNTER — Inpatient Hospital Stay: Payer: Medicare PPO | Attending: Physician Assistant

## 2023-01-12 ENCOUNTER — Encounter: Payer: Self-pay | Admitting: Radiation Oncology

## 2023-01-12 ENCOUNTER — Telehealth: Payer: Self-pay | Admitting: Radiation Oncology

## 2023-01-12 ENCOUNTER — Other Ambulatory Visit: Payer: Self-pay | Admitting: Radiation Oncology

## 2023-01-12 DIAGNOSIS — C3490 Malignant neoplasm of unspecified part of unspecified bronchus or lung: Secondary | ICD-10-CM | POA: Diagnosis not present

## 2023-01-12 MED ORDER — DEXAMETHASONE 4 MG PO TABS: ORAL_TABLET | ORAL | 1 refills | Status: DC

## 2023-01-12 NOTE — Telephone Encounter (Signed)
I spoke with the patient to let her know her case was reviewed in brain oncology conference this morning, and the team was in agreement to continue the steroids we started, and slowly taper with short interval scan. She is doing much better since we started the Dexamethasone 4 mg. She feels less tired, and has more energy, and her balance has been better to where she was able to walk around the house in high heeled shoes which she hadn't done in months. We discussed continuing this for 4 more weeks, and to slowly taper and repeat an MRI in about 8 weeks. She is in agreement with this plan and will continue prilosec while taking steroids.

## 2023-01-13 ENCOUNTER — Other Ambulatory Visit: Payer: Self-pay | Admitting: Radiation Therapy

## 2023-01-13 DIAGNOSIS — C7931 Secondary malignant neoplasm of brain: Secondary | ICD-10-CM

## 2023-01-19 ENCOUNTER — Telehealth: Payer: Self-pay | Admitting: Nurse Practitioner

## 2023-01-19 ENCOUNTER — Telehealth: Payer: Self-pay | Admitting: *Deleted

## 2023-01-19 DIAGNOSIS — J9611 Chronic respiratory failure with hypoxia: Secondary | ICD-10-CM

## 2023-01-19 NOTE — Telephone Encounter (Signed)
Patient states needs oxygen order and signature. Patient using DME [employer]. Beverley Fiedler is the contact person. Deausha phone number is [phone]. Patient phone number is [phone].

## 2023-01-19 NOTE — Telephone Encounter (Signed)
Spoke with the patient regarding side effects she is having with her Dexamethasone.  She reports insomnia.  She was recommended to start some melatonin  to help with sleep.  She was encouraged to take it consistently for at least a week to determine if it works.  She verbalized understanding.  Lind Covert RN, BSN

## 2023-01-20 NOTE — Telephone Encounter (Signed)
Spoke with patient to advised referral had been sent to [employer] on 11/20/22, and to inquire if she had spoken with them to see what her options are for POC. She states she has never heard from [employer]. She also states she feels she has not received adequate care from this office, as she frequently voices concerns or has questions and they are never addressed or answered. She is very frustrated with the process of trying to get a POC. She has already paid a deposit to [employer] so would like to continue with them.  Spoke with Ursula[facility], she advised referral/order had been received and rep had spoken with patient on 11/21/22, at which time patient only wanted a POC and did not want to change DME. Phill Myron to close order at this time.   New order placed for [employer].

## 2023-01-21 ENCOUNTER — Telehealth: Payer: Self-pay | Admitting: Nurse Practitioner

## 2023-01-21 NOTE — Telephone Encounter (Signed)
[employer] calling again. She did get a referral like an O2 order but needs three more things in order to be able to process.  Dr.s signature has no date by it. You can hand write in and initial correction  Dr's NPI # is missing as well. Also initial and date correction  The Dr's address is not on it as well. Please hand write it in, initial and date correction.  Questions pls call [phone] [facility]

## 2023-01-21 NOTE — Telephone Encounter (Signed)
Deausha checking on fax sent for oxygen. Sent on 01/19/2023. Deausha phone number is [phone].

## 2023-01-21 NOTE — Telephone Encounter (Signed)
OV scheduled with RB tomorrow. Nothing further needed at this time.

## 2023-01-21 NOTE — Telephone Encounter (Signed)
Order for [employer] placed yesterday.  [facility] - FYI, thanks!

## 2023-01-21 NOTE — Telephone Encounter (Signed)
[employer] calling again. They have not recd this fax yet. Thanks. (See last signed encounter)

## 2023-01-22 ENCOUNTER — Ambulatory Visit: Payer: Medicare PPO

## 2023-01-22 ENCOUNTER — Ambulatory Visit: Payer: Medicare PPO | Admitting: Emergency Medicine

## 2023-01-22 ENCOUNTER — Encounter: Payer: Self-pay | Admitting: Emergency Medicine

## 2023-01-22 VITALS — BP 120/68 | HR 91 | Ht 60.0 in | Wt 162.0 lb

## 2023-01-22 DIAGNOSIS — C7931 Secondary malignant neoplasm of brain: Secondary | ICD-10-CM

## 2023-01-22 DIAGNOSIS — C3431 Malignant neoplasm of lower lobe, right bronchus or lung: Secondary | ICD-10-CM | POA: Diagnosis not present

## 2023-01-22 DIAGNOSIS — J90 Pleural effusion, not elsewhere classified: Secondary | ICD-10-CM

## 2023-01-22 DIAGNOSIS — C770 Secondary and unspecified malignant neoplasm of lymph nodes of head, face and neck: Secondary | ICD-10-CM

## 2023-01-22 DIAGNOSIS — J910 Malignant pleural effusion: Secondary | ICD-10-CM

## 2023-01-22 DIAGNOSIS — J948 Other specified pleural conditions: Secondary | ICD-10-CM | POA: Diagnosis not present

## 2023-01-22 DIAGNOSIS — Z4682 Encounter for fitting and adjustment of non-vascular catheter: Secondary | ICD-10-CM | POA: Diagnosis not present

## 2023-01-22 DIAGNOSIS — R918 Other nonspecific abnormal finding of lung field: Secondary | ICD-10-CM | POA: Diagnosis not present

## 2023-01-22 NOTE — Patient Instructions (Signed)
We will perform a chest x-ray today. Please continue your oxygen at 2 L/min as you have been using it We will hold off on starting any inhaled medication at this time Continue to drain your Pleurx catheter every 2-3 days.  Keep track of the fluid amount and the frequency with which drain.  If the fluid amount continues to decrease we may be able to discuss having the Pleurx removed at some point in the near future. Follow-up with APP or Dr. Delton Coombes in about 3 months.

## 2023-01-22 NOTE — Telephone Encounter (Signed)
Called and spoke with [employer]. They are faxing over prescription papers for Dr.Byrum or Natalia Leatherwood cobb to sign. Will get signatures and fax back. Pt has OV today and will be informed at that time.

## 2023-01-22 NOTE — Progress Notes (Signed)
Subjective:    Patient ID: Susan Holder, female    DOB: 1956-02-18, 67 y.o.   MRN: [id number]  HPI Susan Holder is a 67 year old former smoker who has been seen in our office by Dr. Thora Lance.  She has stage IV adenocarcinoma with brain metastases and malignant right pleural effusion.  Past medical history also significant for RA, GERD, OA, degenerative disc disease.  She has chronic hypoxemic respiratory failure on 2 L/min.  Completing dexamethasone taper. She has undergone concurrent chemoradiation, SRS to her brain metastasis, then Alimta and Keytruda.  She has a right Pleurx in place, drains it every 2-3 days. She gets about 300cc.  She was tried on [employer], not currently using as it did not seem to help her breathing significantly. She is weareing her O2 reliably.  She reports that she is feeling well, she gets some pain when she drains more than 300cc.   Pulmonary function testing 03/08/2018 reviewed by me showed grossly normal airflows, possible subtle obstruction, Restricted lung volumes   Review of Systems As per HPI  Past Medical History:  Diagnosis Date   Acid reflux 09/21/2018   Adenocarcinoma of right lung, stage 4 (HCC) 11/17/2017   ADHD (attention deficit hyperactivity disorder)    patient denies this dx as of 03/10/22   Allergic rhinitis 08/22/2013   ANA positive 05/29/2016   Anemia    Aortic atherosclerosis (HCC) 10/23/2020   Brain metastases 03/29/2019   COPD (chronic obstructive pulmonary disease) (HCC)    Coronary artery calcification seen on CT scan 10/23/2020   patient is unaware of this dx as of 03/10/22   DDD (degenerative disc disease), cervical 05/29/2016   patient denies this dx as of 03/10/22   Dyspnea    oxygen 2L via [place]   Encounter for antineoplastic chemotherapy 11/17/2017   Encounter for antineoplastic immunotherapy 02/01/2018   Goals of care, counseling/discussion 11/17/2017   High risk medication use 05/29/2016   04/29/2016: ==> plq 200 am &  100 qhs(adeq response).   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   Other fatigue 05/29/2016   Oxygen dependent    2L via [place]   Pericardial effusion 10/23/2020   Pneumonia    as a child   Primary malignant neoplasm of bronchus of right lower lobe (HCC) 12/21/2017   Rheumatoid arthritis (HCC)    Rheumatoid arthritis involving multiple sites with positive rheumatoid factor (HCC) 05/29/2016   +RF +ANA +CCP    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   Smoker 05/29/2016   quit 2019   Trigger finger, left ring finger 05/29/2016   Trigger finger, right ring finger 05/29/2016   Vitamin D deficiency 05/29/2016     Family History  Problem Relation Age of Onset   Stroke Mother    Alzheimer's disease Mother    Heart disease Mother    Emphysema Father    Hypertension Brother    Heart attack Maternal Aunt    Heart failure Maternal Grandmother    Hypertension Paternal Grandmother      Social History   Socioeconomic History   Marital status: Divorced    Spouse name: Not on file   Number of children: 1   Years of education: Not on file  Highest education level: Not on file  Occupational History   Not on file  Tobacco Use   Smoking status: Former    Current packs/day: 0.00    Average packs/day: 0.5 packs/day for 45.0 years (22.5 ttl pk-yrs)    Types: Cigarettes    Start date: 09/29/1972    Quit date: 09/29/2017    Years since quitting: 5.3   Smokeless tobacco: Never  Vaping Use   Vaping status: Never Used  Substance and Sexual Activity   Alcohol use: Not Currently   Drug use: No   Sexual activity: Not Currently    Birth control/protection: Post-menopausal  Other Topics Concern   Not on file  Social History Narrative   Not on file   Social Determinants  of Health   Financial Resource Strain: Low Risk  (12/08/2022)   Overall Financial Resource Strain (CARDIA)    Difficulty of Paying Living Expenses: Not hard at all  Food Insecurity: No Food Insecurity (12/08/2022)   Hunger Vital Sign    Worried About Running Out of Food in the Last Year: Never true    Ran Out of Food in the Last Year: Never true  Transportation Needs: No Transportation Needs (12/08/2022)   PRAPARE - Administrator, Civil Service (Medical): No    Lack of Transportation (Non-Medical): No  Physical Activity: Inactive (12/08/2022)   Exercise Vital Sign    Days of Exercise per Week: 0 days    Minutes of Exercise per Session: 0 min  Stress: Stress Concern Present (12/08/2022)   [employer] of Occupational Health - Occupational Stress Questionnaire    Feeling of Stress : To some extent  Social Connections: Socially Isolated (12/08/2022)   Social Connection and Isolation Panel [NHANES]    Frequency of Communication with Friends and Family: More than three times a week    Frequency of Social Gatherings with Friends and Family: More than three times a week    Attends Religious Services: Never    Database administrator or Organizations: No    Attends Banker Meetings: Never    Marital Status: Divorced  Catering manager Violence: Not At Risk (12/08/2022)   Humiliation, Afraid, Rape, and Kick questionnaire    Fear of Current or Ex-Partner: No    Emotionally Abused: No    Physically Abused: No    Sexually Abused: No     No Known Allergies   Outpatient Medications Prior to Visit  Medication Sig Dispense Refill   acetaminophen (TYLENOL) 500 MG tablet Take 1,000 mg by mouth every 6 (six) hours as needed for moderate pain.     ascorbic acid (VITAMIN C) 500 MG tablet Take 500 mg by mouth daily.     Cholecalciferol (VITAMIN D-3) 125 MCG (5000 UT) TABS Take 5,000 Units by mouth daily.     [START ON 02/06/2023] dexamethasone (DECADRON) 4 MG tablet Take one  tablet dayl but follow taper instructions beginning 02/08/23 30 tablet 1   omeprazole (PRILOSEC) 20 MG capsule Take 20 mg by mouth daily.     Tiotropium Bromide-Olodaterol (STIOLTO RESPIMAT) 2.5-2.5 MCG/ACT AERS Inhale 2 puffs into the lungs daily. (Patient not taking: Reported on 01/22/2023) 4 g 5   No facility-administered medications prior to visit.        Objective:   Physical Exam Vitals:   01/22/23 0913  BP: 120/68  Pulse: 91  SpO2: 100%  Weight: 162 lb (73.5 kg)  Height: 5' (1.524 m)   Gen: Pleasant, well-nourished, in no  distress,  normal affect  ENT: No lesions,  mouth clear,  oropharynx clear, no postnasal drip  Neck: No JVD, no stridor  Lungs: No use of accessory muscles, few crackles on the right, left is clear  Cardiovascular: RRR, heart sounds normal, no murmur or gallops, no peripheral edema  Musculoskeletal: Pleurx catheter is dressed with gauze and Tegaderm  Neuro: alert, awake, non focal  Skin: Warm, no lesions or rash      Assessment & Plan:  Malignant pleural effusion We will perform a chest x-ray today. Please continue your oxygen at 2 L/min as you have been using it We will hold off on starting any inhaled medication at this time Continue to drain your Pleurx catheter every 2-3 days.  Keep track of the fluid amount and the frequency with which drain.  If the fluid amount continues to decrease we may be able to discuss having the Pleurx removed at some point in the near future. Follow-up with APP or Dr. Delton Coombes in about 3 months.   Levy Pupa, MD, PhD 01/22/2023, 9:39 AM [facility] [phone] or if no answer before 7:00PM call [phone] For any issues after 7:00PM please call eLink [phone]

## 2023-01-22 NOTE — Assessment & Plan Note (Signed)
We will perform a chest x-ray today. Please continue your oxygen at 2 L/min as you have been using it We will hold off on starting any inhaled medication at this time Continue to drain your Pleurx catheter every 2-3 days.  Keep track of the fluid amount and the frequency with which drain.  If the fluid amount continues to decrease we may be able to discuss having the Pleurx removed at some point in the near future. Follow-up with APP or Dr. Delton Coombes in about 3 months.

## 2023-01-22 NOTE — Telephone Encounter (Signed)
The order was faxed yesterday and confirmation was received I will forward this to Chantel to be on the look out for a CMN

## 2023-01-26 DIAGNOSIS — H2513 Age-related nuclear cataract, bilateral: Secondary | ICD-10-CM | POA: Diagnosis not present

## 2023-01-26 DIAGNOSIS — C3490 Malignant neoplasm of unspecified part of unspecified bronchus or lung: Secondary | ICD-10-CM | POA: Diagnosis not present

## 2023-01-26 DIAGNOSIS — Z79899 Other long term (current) drug therapy: Secondary | ICD-10-CM | POA: Diagnosis not present

## 2023-01-26 DIAGNOSIS — H16101 Unspecified superficial keratitis, right eye: Secondary | ICD-10-CM | POA: Diagnosis not present

## 2023-01-27 ENCOUNTER — Encounter: Payer: Self-pay | Admitting: Radiation Oncology

## 2023-01-27 ENCOUNTER — Telehealth: Payer: Self-pay | Admitting: Medical Oncology

## 2023-01-27 DIAGNOSIS — J90 Pleural effusion, not elsewhere classified: Secondary | ICD-10-CM | POA: Diagnosis not present

## 2023-01-27 DIAGNOSIS — C7800 Secondary malignant neoplasm of unspecified lung: Secondary | ICD-10-CM | POA: Diagnosis not present

## 2023-01-27 NOTE — Telephone Encounter (Signed)
Dental clearance needed to fix a cracked cap. LVM on [facility] answer machine to contact pt pulmonologist for clearance due to her pulmonary situation .

## 2023-01-28 NOTE — Progress Notes (Signed)
Office Visit Note  Patient: Susan Holder             Date of Birth: 07-11-1955           MRN: [id number]             PCP: [name], MD Referring: [name], MD Visit Date: 02/10/2023 Occupation: @GUAROCC @  Subjective:  Pain in multiple joints  History of Present Illness: Susan Holder is a 67 y.o. female with seropositive rheumatoid arthritis and stage III adenocarcinoma of right lung.  She returns today after her last visit in August 2023.  She states she ran out of Plaquenil in November 2023 and has been off Plaquenil since then.  She did fine initially but gradually the pain and discomfort started coming back.  She states she has been having increased pain and swelling in her joints for the last 3 to 4 months.  She had thoracostomy tube placement on the right side in November 2023.  She states she has recurrence of active lung cancer in her left lung for which she received radiation therapy about a month ago and has been followed by Dr. Arbutus Ped closely.  She was placed on Decadron 4 mg p.o. daily about a month ago which she is tapering gradually.  She states since she has been taking Decadron the joint pain has improved.  But she would like to resume Plaquenil has she will come off Decadron the joint symptoms will increase.  She has morning stiffness lasting almost all day and has discomfort in almost all of her joints in doing all routine activities.  She is also followed by Dr. Jamse Mead.  She is on oxygen 2 L/min.  She has Pleurx catheter drained every 2 to 3 days.  She had recent MRI of the brain which showed a component of inflammatory process.  She was placed on dexamethasone 4 mg p.o. daily.  She has an appointment Dr. Shirline Frees next week.    Activities of Daily Living:  Patient reports morning stiffness for all day. Patient Reports nocturnal pain.  Difficulty dressing/grooming: Reports Difficulty climbing stairs: Reports Difficulty getting out of chair:  Reports Difficulty using hands for taps, buttons, cutlery, and/or writing: Reports  Review of Systems  Constitutional:  Positive for fatigue.  HENT:  Negative for mouth sores and mouth dryness.   Eyes:  Negative for dryness.  Respiratory:  Positive for cough, shortness of breath and wheezing.   Cardiovascular:  Negative for chest pain and palpitations.  Gastrointestinal:  Negative for blood in stool, constipation and diarrhea.  Endocrine: Negative for increased urination.  Genitourinary:  Negative for involuntary urination.  Musculoskeletal:  Positive for joint pain, gait problem, joint pain, joint swelling, myalgias, muscle weakness, morning stiffness, muscle tenderness and myalgias.  Skin:  Negative for color change, rash, hair loss and sensitivity to sunlight.  Allergic/Immunologic: Negative for susceptible to infections.  Neurological:  Positive for light-headedness. Negative for dizziness and headaches.  Hematological:  Negative for swollen glands.  Psychiatric/Behavioral:  Positive for depressed mood and sleep disturbance. The patient is not nervous/anxious.     PMFS History:  Patient Active Problem List   Diagnosis Date Noted   Centrilobular emphysema (HCC) 11/20/2022   Malignant pleural effusion 11/20/2022   Chronic respiratory failure with hypoxia (HCC) 11/20/2022   Pneumonitis 03/11/2022   Adenocarcinoma of right lung, stage 4 (HCC) 02/20/2022   Unsteady gait when walking 11/19/2021   Dizziness 11/12/2021   S/P Left sided pigtail catheter  placement 05/13/2021   Anemia 04/23/2021   Pleural effusion 04/12/2021   Malnutrition of moderate degree 02/09/2021   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   Aortic atherosclerosis (HCC) 10/23/2020   Coronary artery calcification seen on CT scan 10/23/2020   Pericardial effusion 10/23/2020   met lung ca 10/22/2020   Rheumatoid arthritis (HCC) 10/22/2020   Metastasis to brain (HCC) 03/29/2019   Metastasis to supraclavicular  lymph node (HCC) 11/04/2018   Acid reflux 09/21/2018   Hoarseness 03/16/2018   Encounter for antineoplastic immunotherapy 02/01/2018   Primary malignant neoplasm of bronchus of right lower lobe (HCC) 12/21/2017   Adenocarcinoma of right lung, stage 4 (HCC) 11/17/2017   Encounter for antineoplastic chemotherapy 11/17/2017   Goals of care, counseling/discussion 11/17/2017   Pulmonary nodules    Left hand pain 03/06/2017   Rheumatoid arthritis involving multiple sites with positive rheumatoid factor (HCC) 05/29/2016   ANA positive 05/29/2016   Vitamin D deficiency 05/29/2016   High risk medication use 05/29/2016   Trigger finger, left ring finger 05/29/2016   Trigger finger, right ring finger 05/29/2016   DDD (degenerative disc disease), cervical 05/29/2016   Smoker 05/29/2016   Other fatigue 05/29/2016   Allergic rhinitis 08/22/2013    Past Medical History:  Diagnosis Date   Acid reflux 09/21/2018   Adenocarcinoma of right lung, stage 4 (HCC) 11/17/2017   ADHD (attention deficit hyperactivity disorder)    patient denies this dx as of 03/10/22   Allergic rhinitis 08/22/2013   ANA positive 05/29/2016   Anemia    Aortic atherosclerosis (HCC) 10/23/2020   Brain metastases 03/29/2019   COPD (chronic obstructive pulmonary disease) (HCC)    Coronary artery calcification seen on CT scan 10/23/2020   patient is unaware of this dx as of 03/10/22   DDD (degenerative disc disease), cervical 05/29/2016   patient denies this dx as of 03/10/22   Dyspnea    oxygen 2L via [place]   Encounter for antineoplastic chemotherapy 11/17/2017   Encounter for antineoplastic immunotherapy 02/01/2018   Goals of care, counseling/discussion 11/17/2017   High risk medication use 05/29/2016   04/29/2016: ==> plq 200 am & 100 qhs(adeq response).   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) 11/04/2018   Other fatigue 05/29/2016   Oxygen dependent    2L via [place]   Pericardial effusion 10/23/2020   Pneumonia    as a child   Primary malignant neoplasm of bronchus of right lower lobe (HCC) 12/21/2017   Rheumatoid arthritis (HCC)    Rheumatoid arthritis involving multiple sites with positive rheumatoid factor (HCC) 05/29/2016   +RF +ANA +CCP    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   Smoker 05/29/2016   quit 2019   Trigger finger, left ring finger 05/29/2016   Trigger finger, right ring finger 05/29/2016   Vitamin D deficiency 05/29/2016    Family History  Problem Relation Age of Onset   Stroke Mother    Alzheimer's disease Mother    Heart disease Mother    Emphysema Father    Hypertension Brother    Heart attack Maternal Aunt    Heart failure Maternal Grandmother    Hypertension Paternal Grandmother    Past Surgical History:  Procedure Laterality Date  BRONCHIAL BIOPSY  03/11/2022   Procedure: BRONCHIAL BIOPSIES;  Surgeon: Omar Person, MD;  Location: [facility] ENDOSCOPY;  Service: Pulmonary;;   BRONCHIAL BIOPSY  11/07/2022   Procedure: BRONCHIAL BIOPSIES;  Surgeon: Omar Person, MD;  Location: [facility] ENDOSCOPY;  Service: Pulmonary;;   BRONCHIAL BRUSHINGS  11/07/2022   Procedure: BRONCHIAL BRUSHINGS;  Surgeon: Omar Person, MD;  Location: [facility] ENDOSCOPY;  Service: Pulmonary;;   BRONCHIAL NEEDLE ASPIRATION BIOPSY  11/09/2017   Procedure: BRONCHIAL NEEDLE ASPIRATION BIOPSIES;  Surgeon: Chilton Greathouse, MD;  Location: [facility] ENDOSCOPY;  Service: Cardiopulmonary;;   BRONCHIAL NEEDLE ASPIRATION BIOPSY  11/07/2022   Procedure: BRONCHIAL NEEDLE ASPIRATION BIOPSIES;  Surgeon: Omar Person, MD;  Location: [facility] ENDOSCOPY;  Service: Pulmonary;;   BRONCHIAL WASHINGS  03/11/2022   Procedure: BRONCHIAL WASHINGS;  Surgeon: Omar Person, MD;  Location: [facility] ENDOSCOPY;  Service:  Pulmonary;;   CHEST TUBE INSERTION Left 05/13/2021   Procedure: INSERTION PLEURAL DRAINAGE CATHETER;  Surgeon: Corliss Skains, MD;  Location: [facility] OR;  Service: Thoracic;  Laterality: Left;   CHEST TUBE INSERTION Right 03/11/2022   Procedure: INSERTION PLEURAL DRAINAGE CATHETER;  Surgeon: Omar Person, MD;  Location: [facility] ENDOSCOPY;  Service: Pulmonary;  Laterality: Right;  indwelling pleural catheter, w/ cuff   ENDOBRONCHIAL ULTRASOUND Bilateral 11/09/2017   Procedure: ENDOBRONCHIAL ULTRASOUND;  Surgeon: Chilton Greathouse, MD;  Location: [facility] ENDOSCOPY;  Service: Cardiopulmonary;  Laterality: Bilateral;   FIDUCIAL MARKER PLACEMENT  11/07/2022   Procedure: FIDUCIAL MARKER PLACEMENT;  Surgeon: Omar Person, MD;  Location: [facility] ENDOSCOPY;  Service: Pulmonary;;   NASAL SINUS SURGERY     NASAL SINUS SURGERY     THORACENTESIS Right 02/07/2021   Procedure: THORACENTESIS;  Surgeon: Corliss Skains, MD;  Location: [facility] OR;  Service: Thoracic;  Laterality: Right;   TUBAL LIGATION     VIDEO BRONCHOSCOPY  11/09/2017   Procedure: VIDEO BRONCHOSCOPY;  Surgeon: Chilton Greathouse, MD;  Location: [facility] ENDOSCOPY;  Service: Cardiopulmonary;;   VIDEO BRONCHOSCOPY Left 03/11/2022   Procedure: VIDEO BRONCHOSCOPY WITH FLUORO;  Surgeon: Omar Person, MD;  Location: [facility] ENDOSCOPY;  Service: Pulmonary;  Laterality: Left;   VIDEO BRONCHOSCOPY WITH RADIAL ENDOBRONCHIAL ULTRASOUND  11/07/2022   Procedure: VIDEO BRONCHOSCOPY WITH RADIAL ENDOBRONCHIAL ULTRASOUND;  Surgeon: Omar Person, MD;  Location: [facility] ENDOSCOPY;  Service: Pulmonary;;   XI ROBOTIC ASSISTED PERICARDIAL WINDOW Left 02/07/2021   Procedure: XI ROBOTIC ASSISTED THORACOSCOPY PERICARDIAL WINDOW;  Surgeon: Corliss Skains, MD;  Location: [facility] OR;  Service: Thoracic;  Laterality: Left;   Social History   Social History Narrative   Not on file   Immunization History  Administered Date(s) Administered   Influenza, High Dose Seasonal PF  01/22/2019   Influenza,inj,Quad PF,6+ Mos 03/08/2018   Influenza-Unspecified 02/08/2020, 02/16/2022   PFIZER(Purple Top)SARS-COV-2 Vaccination 06/02/2019, 06/23/2019, 12/24/2019     Objective: Vital Signs: BP 128/76 (BP Location: Left Arm, Patient Position: Sitting, Cuff Size: Normal)   Pulse (!) 102   Ht 5' (1.524 m)   Wt 166 lb 9.6 oz (75.6 kg)   LMP 04/28/2000   BMI 32.54 kg/m    Physical Exam Vitals and nursing note reviewed.  Constitutional:      Appearance: She is well-developed.  HENT:     Head: Normocephalic and atraumatic.  Eyes:     Conjunctiva/sclera: Conjunctivae normal.  Cardiovascular:     Rate and Rhythm: Normal rate and regular rhythm.     Heart sounds: Normal heart sounds.  Pulmonary:     Effort: Pulmonary  effort is normal.     Breath sounds: Normal breath sounds.  Abdominal:     General: Bowel sounds are normal.     Palpations: Abdomen is soft.  Musculoskeletal:     Cervical back: Normal range of motion.  Lymphadenopathy:     Cervical: No cervical adenopathy.  Skin:    General: Skin is warm and dry.     Capillary Refill: Capillary refill takes less than 2 seconds.  Neurological:     Mental Status: She is alert and oriented to person, place, and time.  Psychiatric:        Behavior: Behavior normal.      Musculoskeletal Exam: Cervical spine was in good range of motion.  Wrist, elbows, wrist joints, MCPs PIPs and DIPs been good range of motion with no synovitis.  Hip joints, knee joints, ankles, MTPs and PIPs been good range of motion with no synovitis.  CDAI Exam: CDAI Score: -- Patient Global: 0 / 100; Provider Global: 0 / 100 Swollen: --; Tender: -- Joint Exam 02/10/2023   No joint exam has been documented for this visit   There is currently no information documented on the homunculus. Go to the Rheumatology activity and complete the homunculus joint exam.  Investigation: No additional findings.  Imaging: No results found.  Recent  Labs: Lab Results  Component Value Date   WBC 7.7 10/14/2022   HGB 10.6 (L) 10/14/2022   PLT 289 10/14/2022   NA 139 10/14/2022   K 4.6 10/14/2022   CL 105 10/14/2022   CO2 28 10/14/2022   GLUCOSE 88 10/14/2022   BUN 22 10/14/2022   CREATININE 1.23 (H) 10/14/2022   BILITOT 0.2 (L) 10/14/2022   ALKPHOS 104 10/14/2022   AST 18 10/14/2022   ALT 8 10/14/2022   PROT 7.4 10/14/2022   ALBUMIN 3.5 10/14/2022   CALCIUM 10.1 10/14/2022   GFRAA >60 01/17/2020    Speciality Comments: PLQ eye exam: 03/11/2021 WNL @ [facility]. Follow up in 1 year.  Procedures:  No procedures performed Allergies: Patient has no known allergies.   Assessment / Plan:     Visit Diagnoses: Rheumatoid arthritis involving multiple sites with positive rheumatoid factor (HCC) - +RF, +anti-CCP, +ANA with nodulosis.  Patient has been off Plaquenil since December 2023.  She states for the last for 5 months she has been gradually having progressively increasing pain in her joints.  Patient states the pain improved after she started Decadron.  She is currently not having any symptoms.  She states Decadron will be tapered soon and then the pain is going to recur.  She wants to resume Plaquenil.  Side effects were reviewed.  Patient states she had an eye exam recently.  Patient had labs on October 14, 2022.  Hemoglobin was low at 10.6 and creatinine was elevated 1.43.  High risk medication use - previously taking plaquenil 200 mg 1 tablet by mouth twice daily Monday through Friday only.  I will start her on Plaquenil 200 mg 1 tablet by mouth daily based on her low GFR.  PLQ eye exam: 03/11/2021.  We will get report of [facility]  Patient will be getting labs tomorrow with Dr. Asa Lente office.  Information regarding the immunization was placed in the AVS.  DDD (degenerative disc disease), cervical-she had noticed some full range of motion of the cervical spine today.  Gait  instability-patient feels she is not very stable on her feet due to brain mets.  I discussed the option of walker versus cane.  I think walker will be more beneficial for her to keep the oxygen.  Patient requested a cane instead.  A prescription for walking cane was given.  Adenocarcinoma of right lung, stage 3 (HCC) - Followed by Dr. Arbutus Ped.,  Dr. Delton Coombes and radiation oncology.  Patient finished radiation therapy a month ago.  She is currently on 2 L of oxygen.  She also has Pleurx catheter which needs to be drained every 2 to 3 days.  Patient states she is not ready stable on her feet due to brain mets.  Patient is currently on Decadron due to  inflammation seen on the MRI of her brain.  History of vitamin D deficiency  Orders: No orders of the defined types were placed in this encounter.  Meds ordered this encounter  Medications   hydroxychloroquine (PLAQUENIL) 200 MG tablet    Sig: Take 1 tablet (200 mg total) by mouth daily.    Dispense:  90 tablet    Refill:  0    Face-to-face time spent with patient was 30 minutes. Greater than 50% of time was spent in counseling and coordination of care.  Follow-Up Instructions: Return in about 3 months (around 05/13/2023) for Rheumatoid arthritis.   Pollyann Savoy, MD  Note - This record has been created using Animal nutritionist.  Chart creation errors have been sought, but may not always  have been located. Such creation errors do not reflect on  the standard of medical care.

## 2023-02-03 ENCOUNTER — Encounter: Payer: Self-pay | Admitting: Radiation Oncology

## 2023-02-03 NOTE — Progress Notes (Signed)
Radiation Oncology         [phone] ________________________________  Name: Susan Holder  FAO:[id number]  Date of Service: 02/03/23  DOB: August 02, 1955   Steroid Taper Instructions   You currently have a prescription for Dexamethasone 4 mg Tablets.   Beginning 02/09/23: Take 1/2 of a tablet (which is 2 mg) once a day  Beginning 02/16/23: Take 1/2 of a tablet (which is 2 mg) every other day and stop on 02/23/23.   Please call our office if you have any headaches, visual changes, uncontrolled movements, changes in coordination or dizziness, extremity weakness, nausea or vomiting.

## 2023-02-10 ENCOUNTER — Encounter: Payer: Self-pay | Admitting: Rheumatology

## 2023-02-10 ENCOUNTER — Ambulatory Visit: Payer: Medicare PPO | Attending: Rheumatology | Admitting: Rheumatology

## 2023-02-10 VITALS — BP 128/76 | HR 102 | Ht 60.0 in | Wt 166.6 lb

## 2023-02-10 DIAGNOSIS — M5030 Other cervical disc degeneration, unspecified cervical region: Secondary | ICD-10-CM | POA: Diagnosis not present

## 2023-02-10 DIAGNOSIS — Z79899 Other long term (current) drug therapy: Secondary | ICD-10-CM | POA: Diagnosis not present

## 2023-02-10 DIAGNOSIS — R2681 Unsteadiness on feet: Secondary | ICD-10-CM

## 2023-02-10 DIAGNOSIS — Z8639 Personal history of other endocrine, nutritional and metabolic disease: Secondary | ICD-10-CM | POA: Diagnosis not present

## 2023-02-10 DIAGNOSIS — M0579 Rheumatoid arthritis with rheumatoid factor of multiple sites without organ or systems involvement: Secondary | ICD-10-CM | POA: Diagnosis not present

## 2023-02-10 DIAGNOSIS — M65331 Trigger finger, right middle finger: Secondary | ICD-10-CM

## 2023-02-10 DIAGNOSIS — C3491 Malignant neoplasm of unspecified part of right bronchus or lung: Secondary | ICD-10-CM | POA: Diagnosis not present

## 2023-02-10 DIAGNOSIS — M7021 Olecranon bursitis, right elbow: Secondary | ICD-10-CM

## 2023-02-10 MED ORDER — HYDROXYCHLOROQUINE SULFATE 200 MG PO TABS: 200.0000 mg | ORAL_TABLET | Freq: Every day | ORAL | 0 refills | Status: DC

## 2023-02-10 NOTE — Patient Instructions (Signed)
Vaccines You are taking a medication(s) that can suppress your immune system.  The following immunizations are recommended: Flu annually Covid-19  RSV Td/Tdap (tetanus, diphtheria, pertussis) every 10 years Pneumonia (Prevnar 15 then Pneumovax 23 at least 1 year apart.  Alternatively, can take Prevnar 20 without needing additional dose) Shingrix: 2 doses from 4 weeks to 6 months apart  Please check with your PCP to make sure you are up to date.  

## 2023-02-11 ENCOUNTER — Inpatient Hospital Stay: Payer: Medicare PPO | Attending: Physician Assistant

## 2023-02-11 ENCOUNTER — Ambulatory Visit (HOSPITAL_COMMUNITY)
Admission: RE | Admit: 2023-02-11 | Discharge: 2023-02-11 | Disposition: A | Discharge status: Home / Self Care | Payer: Medicare PPO | Source: Ambulatory Visit | Attending: Physician Assistant

## 2023-02-11 DIAGNOSIS — Z923 Personal history of irradiation: Secondary | ICD-10-CM | POA: Insufficient documentation

## 2023-02-11 DIAGNOSIS — D509 Iron deficiency anemia, unspecified: Secondary | ICD-10-CM | POA: Diagnosis not present

## 2023-02-11 DIAGNOSIS — Z79899 Other long term (current) drug therapy: Secondary | ICD-10-CM | POA: Insufficient documentation

## 2023-02-11 DIAGNOSIS — C770 Secondary and unspecified malignant neoplasm of lymph nodes of head, face and neck: Secondary | ICD-10-CM | POA: Diagnosis not present

## 2023-02-11 DIAGNOSIS — Z9221 Personal history of antineoplastic chemotherapy: Secondary | ICD-10-CM | POA: Diagnosis not present

## 2023-02-11 DIAGNOSIS — C7931 Secondary malignant neoplasm of brain: Secondary | ICD-10-CM | POA: Insufficient documentation

## 2023-02-11 DIAGNOSIS — C3491 Malignant neoplasm of unspecified part of right bronchus or lung: Secondary | ICD-10-CM | POA: Insufficient documentation

## 2023-02-11 DIAGNOSIS — J432 Centrilobular emphysema: Secondary | ICD-10-CM | POA: Diagnosis not present

## 2023-02-11 DIAGNOSIS — J8410 Pulmonary fibrosis, unspecified: Secondary | ICD-10-CM | POA: Diagnosis not present

## 2023-02-11 DIAGNOSIS — Z87891 Personal history of nicotine dependence: Secondary | ICD-10-CM | POA: Insufficient documentation

## 2023-02-11 DIAGNOSIS — R911 Solitary pulmonary nodule: Secondary | ICD-10-CM | POA: Diagnosis not present

## 2023-02-11 DIAGNOSIS — C3431 Malignant neoplasm of lower lobe, right bronchus or lung: Secondary | ICD-10-CM | POA: Diagnosis not present

## 2023-02-11 DIAGNOSIS — K5730 Diverticulosis of large intestine without perforation or abscess without bleeding: Secondary | ICD-10-CM | POA: Diagnosis not present

## 2023-02-11 LAB — CMP (CANCER CENTER ONLY)
ALT: 20 U/L (ref 0–44)
AST: 22 U/L (ref 15–41)
Albumin: 3.3 g/dL — ABNORMAL LOW (ref 3.5–5.0)
Alkaline Phosphatase: 95 U/L (ref 38–126)
Anion gap: 9 (ref 5–15)
BUN: 31 mg/dL — ABNORMAL HIGH (ref 8–23)
CO2: 28 mmol/L (ref 22–32)
Calcium: 9.1 mg/dL (ref 8.9–10.3)
Chloride: 101 mmol/L (ref 98–111)
Creatinine: 1.16 mg/dL — ABNORMAL HIGH (ref 0.44–1.00)
GFR, Estimated: 51 mL/min — ABNORMAL LOW (ref >60)
Glucose, Bld: 115 mg/dL — ABNORMAL HIGH (ref 70–99)
Potassium: 4.2 mmol/L (ref 3.5–5.1)
Sodium: 138 mmol/L (ref 135–145)
Total Bilirubin: 0.4 mg/dL (ref 0.3–1.2)
Total Protein: 6.9 g/dL (ref 6.5–8.1)

## 2023-02-11 LAB — CBC WITH DIFFERENTIAL (CANCER CENTER ONLY)
Abs Immature Granulocytes: 0.13 10*3/uL — ABNORMAL HIGH (ref 0.00–0.07)
Basophils Absolute: 0 10*3/uL (ref 0.0–0.1)
Basophils Relative: 0 %
Eosinophils Absolute: 0 10*3/uL (ref 0.0–0.5)
Eosinophils Relative: 0 %
HCT: 37.4 % (ref 36.0–46.0)
Hemoglobin: 11.5 g/dL — ABNORMAL LOW (ref 12.0–15.0)
Immature Granulocytes: 2 %
Lymphocytes Relative: 3 %
Lymphs Abs: 0.2 10*3/uL — ABNORMAL LOW (ref 0.7–4.0)
MCH: 29.9 pg (ref 26.0–34.0)
MCHC: 30.7 g/dL (ref 30.0–36.0)
MCV: 97.1 fL (ref 80.0–100.0)
Monocytes Absolute: 0.5 10*3/uL (ref 0.1–1.0)
Monocytes Relative: 6 %
Neutro Abs: 7.2 10*3/uL (ref 1.7–7.7)
Neutrophils Relative %: 89 %
Platelet Count: 262 10*3/uL (ref 150–400)
RBC: 3.85 MIL/uL — ABNORMAL LOW (ref 3.87–5.11)
RDW: 17.7 % — ABNORMAL HIGH (ref 11.5–15.5)
WBC Count: 8.1 10*3/uL (ref 4.0–10.5)
nRBC: 0 % (ref 0.0–0.2)

## 2023-02-11 MED ORDER — IOHEXOL 300 MG/ML  SOLN
100.0000 mL | Freq: Once | INTRAMUSCULAR | Status: AC | PRN
  Administered 2023-02-11: 100 mL via INTRAVENOUS

## 2023-02-13 ENCOUNTER — Telehealth: Payer: Self-pay | Admitting: Radiation Therapy

## 2023-02-13 NOTE — Telephone Encounter (Signed)
I called to ensure that Susan Holder started the steroid taper as instructed on Monday 10/14 and to see how she has felt since this change. She is currently taking 2mg  daily and has not had any negative effects from this decrease in dose. She knows to reduce to 2mg  every other day starting on 10/21 and to stop completely on 10/28.   The only complaint shared was about weight gain she has experienced, but this began even before adding the steroid prescription. She is just hopeful to lose some of the unwanted weight she has put on recently.   [name] R.T.(R)(T) Radiation Special Procedures Navigator

## 2023-02-17 ENCOUNTER — Inpatient Hospital Stay: Payer: Medicare PPO | Admitting: Internal Medicine

## 2023-02-17 VITALS — BP 138/61 | HR 103 | Temp 98.0°F | Resp 19 | Ht 60.0 in | Wt 163.3 lb

## 2023-02-17 DIAGNOSIS — Z923 Personal history of irradiation: Secondary | ICD-10-CM | POA: Diagnosis not present

## 2023-02-17 DIAGNOSIS — C3490 Malignant neoplasm of unspecified part of unspecified bronchus or lung: Secondary | ICD-10-CM | POA: Diagnosis not present

## 2023-02-17 DIAGNOSIS — Z87891 Personal history of nicotine dependence: Secondary | ICD-10-CM | POA: Diagnosis not present

## 2023-02-17 DIAGNOSIS — C7931 Secondary malignant neoplasm of brain: Secondary | ICD-10-CM | POA: Diagnosis not present

## 2023-02-17 DIAGNOSIS — J432 Centrilobular emphysema: Secondary | ICD-10-CM | POA: Diagnosis not present

## 2023-02-17 DIAGNOSIS — C770 Secondary and unspecified malignant neoplasm of lymph nodes of head, face and neck: Secondary | ICD-10-CM | POA: Diagnosis not present

## 2023-02-17 DIAGNOSIS — Z79899 Other long term (current) drug therapy: Secondary | ICD-10-CM | POA: Diagnosis not present

## 2023-02-17 DIAGNOSIS — D509 Iron deficiency anemia, unspecified: Secondary | ICD-10-CM | POA: Diagnosis not present

## 2023-02-17 DIAGNOSIS — C3431 Malignant neoplasm of lower lobe, right bronchus or lung: Secondary | ICD-10-CM | POA: Diagnosis not present

## 2023-02-17 DIAGNOSIS — Z9221 Personal history of antineoplastic chemotherapy: Secondary | ICD-10-CM | POA: Diagnosis not present

## 2023-02-17 NOTE — Progress Notes (Signed)
[facility] [phone]   [phone]  OFFICE PROGRESS NOTE  [name], MD [address] [name] [address]  DIAGNOSIS: Metastatic non-small cell lung cancer initially diagnosed as stage IIIA (T3, N1/N2, M0) non-small cell lung cancer, adenocarcinoma presented with large right lower lobe lung mass with extension to the right hilum and subcarinal area diagnosed in July 2019.  She has brain metastasis in October 2020.   Biomarker Findings Tumor Mutational Burden - TMB-Intermediate (6 Muts/Mb) Microsatellite status - MS-Stable Genomic Findings For a complete list of the genes assayed, please refer to the Appendix. NRAS Q61R ARAF amplification STK11 G56W KRAS G13D MYCN amplification MCL1 amplification NKX2-1 amplification - equivocal? TP53 G245V 7 Disease relevant genes with no reportable alterations: EGFR, ALK, BRAF, MET, ERBB2, RET, ROS1    PRIOR THERAPY: 1) Course of concurrent chemoradiation with weekly carboplatin for AUC of 2 and paclitaxel 45 mg/M2.  Status post 7 cycles.  Last dose was giving 01/11/2018. 2) Consolidation treatment with immunotherapy with Imfinzi (Durvalumab) 10 mg/KG every 2 weeks.  First dose February 09, 2018.  Status post 19 cycles. 3) status post stereotactic body radiotherapy to the enlarging right supraclavicular lymphadenopathy under the care of Dr. Mitzi Hansen. 4) SRS to multiple brain metastasis under the care of Dr. Mitzi Hansen. 5) Systemic chemotherapy with carboplatin for AUC of 5, Alimta 500 mg/M2 and Keytruda 200 mg IV every 3 weeks.  First dose 08/16/2019.  Status post 35 cycles.  Starting from cycle #5 the patient is on maintenance treatment with Alimta and Keytruda every 3 weeks. Alimta was reduced to 400 mg/m2 starting from cycle #21.  Starting from cycle #30 her treatment was changed to single agent Keytruda every 3 weeks.  Alimta was discontinued after Sep 04, 2021 secondary to toxicity.    CURRENT THERAPY: Observation.   INTERVAL HISTORY: Susan Holder 67 y.o. female turns to the clinic today for follow-up visit.Discussed the use of AI scribe software for clinical note transcription with the patient, who gave verbal consent to proceed.  History of Present Illness   Susan Holder, a 67 year old patient, was initially diagnosed with stage 3A non-small cell lung cancer in July 2019. The patient underwent chemotherapy, radiation, and Imfinzi treatment. When the cancer metastasized, the treatment plan was adjusted to include carboplatin, Alimta, and Keytruda. The patient completed two years of Keytruda treatment approximately a year and a half ago and has been under observation since then.  Recently, a growing nodule was detected in the left upper lobe of the patient's lung, which was treated with radiation. The patient reports persistent shortness of breath, requiring the use of supplemental oxygen. Additionally, the patient experiences fatigue, which is exacerbated by even mundane tasks. The patient attributes this lack of energy to her COPD, previous radiation treatments, and the presence of fluid around the lung and heart.  The patient has been under the care of a new pulmonologist Dr. Delton Coombes since her previous one left. The patient has also been dealing with anemia and is currently on iron supplements. The patient has expressed concerns about her energy levels and is considering physical therapy. The patient has also been experiencing swelling and a rapid heart rate, which may be indicative of fluid accumulation in the body. The patient has previously seen a cardiologist once and is considering scheduling a follow-up appointment.        MEDICAL HISTORY: Past Medical History:  Diagnosis Date   Acid reflux 09/21/2018  Adenocarcinoma of right lung, stage 4 (HCC) 11/17/2017   ADHD (attention deficit hyperactivity disorder)    patient denies this dx as of 03/10/22   Allergic  rhinitis 08/22/2013   ANA positive 05/29/2016   Anemia    Aortic atherosclerosis (HCC) 10/23/2020   Brain metastases 03/29/2019   COPD (chronic obstructive pulmonary disease) (HCC)    Coronary artery calcification seen on CT scan 10/23/2020   patient is unaware of this dx as of 03/10/22   DDD (degenerative disc disease), cervical 05/29/2016   patient denies this dx as of 03/10/22   Dyspnea    oxygen 2L via [place]   Encounter for antineoplastic chemotherapy 11/17/2017   Encounter for antineoplastic immunotherapy 02/01/2018   Goals of care, counseling/discussion 11/17/2017   High risk medication use 05/29/2016   04/29/2016: ==> plq 200 am & 100 qhs(adeq response).   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  MEDICATIONS:  Current Outpatient Medications  Medication Sig Dispense Refill   acetaminophen (TYLENOL) 500 MG tablet Take 1,000 mg by mouth every 6 (six) hours as needed for moderate pain.     ascorbic acid (VITAMIN C) 500 MG tablet Take 500 mg by mouth daily.     Cholecalciferol (VITAMIN D-3) 125 MCG (5000 UT) TABS Take 5,000 Units by mouth daily.     dexamethasone (DECADRON) 4 MG  tablet Take one tablet dayl but follow taper instructions beginning 02/08/23 30 tablet 1   hydroxychloroquine (PLAQUENIL) 200 MG tablet Take 1 tablet (200 mg total) by mouth daily. 90 tablet 0   omeprazole (PRILOSEC) 20 MG capsule Take 20 mg by mouth daily.     Tiotropium Bromide-Olodaterol (STIOLTO RESPIMAT) 2.5-2.5 MCG/ACT AERS Inhale 2 puffs into the lungs daily. (Patient not taking: Reported on 01/22/2023) 4 g 5   No current facility-administered medications for this visit.    SURGICAL HISTORY:  Past Surgical History:  Procedure Laterality Date   BRONCHIAL BIOPSY  03/11/2022   Procedure: BRONCHIAL BIOPSIES;  Surgeon: Omar Person, MD;  Location: [facility] ENDOSCOPY;  Service: Pulmonary;;   BRONCHIAL BIOPSY  11/07/2022   Procedure: BRONCHIAL BIOPSIES;  Surgeon: Omar Person, MD;  Location: [facility] ENDOSCOPY;  Service: Pulmonary;;   BRONCHIAL BRUSHINGS  11/07/2022   Procedure: BRONCHIAL BRUSHINGS;  Surgeon: Omar Person, MD;  Location: [facility] ENDOSCOPY;  Service: Pulmonary;;   BRONCHIAL NEEDLE ASPIRATION BIOPSY  11/09/2017   Procedure: BRONCHIAL NEEDLE ASPIRATION BIOPSIES;  Surgeon: Chilton Greathouse, MD;  Location: [facility] ENDOSCOPY;  Service: Cardiopulmonary;;   BRONCHIAL NEEDLE ASPIRATION BIOPSY  11/07/2022   Procedure: BRONCHIAL NEEDLE ASPIRATION  BIOPSIES;  Surgeon: Omar Person, MD;  Location: [facility] ENDOSCOPY;  Service: Pulmonary;;   BRONCHIAL WASHINGS  03/11/2022   Procedure: BRONCHIAL WASHINGS;  Surgeon: Omar Person, MD;  Location: [facility] ENDOSCOPY;  Service: Pulmonary;;   CHEST TUBE INSERTION Left 05/13/2021   Procedure: INSERTION PLEURAL DRAINAGE CATHETER;  Surgeon: Corliss Skains, MD;  Location: [facility] OR;  Service: Thoracic;  Laterality: Left;   CHEST TUBE INSERTION Right 03/11/2022   Procedure: INSERTION PLEURAL DRAINAGE CATHETER;  Surgeon: Omar Person, MD;  Location: [facility] ENDOSCOPY;  Service: Pulmonary;  Laterality: Right;  indwelling pleural catheter, w/ cuff    ENDOBRONCHIAL ULTRASOUND Bilateral 11/09/2017   Procedure: ENDOBRONCHIAL ULTRASOUND;  Surgeon: Chilton Greathouse, MD;  Location: [facility] ENDOSCOPY;  Service: Cardiopulmonary;  Laterality: Bilateral;   FIDUCIAL MARKER PLACEMENT  11/07/2022   Procedure: FIDUCIAL MARKER PLACEMENT;  Surgeon: Omar Person, MD;  Location: [facility] ENDOSCOPY;  Service: Pulmonary;;   NASAL SINUS SURGERY     NASAL SINUS SURGERY     THORACENTESIS Right 02/07/2021   Procedure: THORACENTESIS;  Surgeon: Corliss Skains, MD;  Location: [facility] OR;  Service: Thoracic;  Laterality: Right;   TUBAL LIGATION     VIDEO BRONCHOSCOPY  11/09/2017   Procedure: VIDEO BRONCHOSCOPY;  Surgeon: Chilton Greathouse, MD;  Location: [facility] ENDOSCOPY;  Service: Cardiopulmonary;;   VIDEO BRONCHOSCOPY Left 03/11/2022   Procedure: VIDEO BRONCHOSCOPY WITH FLUORO;  Surgeon: Omar Person, MD;  Location: [facility] ENDOSCOPY;  Service: Pulmonary;  Laterality: Left;   VIDEO BRONCHOSCOPY WITH RADIAL ENDOBRONCHIAL ULTRASOUND  11/07/2022   Procedure: VIDEO BRONCHOSCOPY WITH RADIAL ENDOBRONCHIAL ULTRASOUND;  Surgeon: Omar Person, MD;  Location: [facility] ENDOSCOPY;  Service: Pulmonary;;   XI ROBOTIC ASSISTED PERICARDIAL WINDOW Left 02/07/2021   Procedure: XI ROBOTIC ASSISTED THORACOSCOPY PERICARDIAL WINDOW;  Surgeon: Corliss Skains, MD;  Location: [facility] OR;  Service: Thoracic;  Laterality: Left;    REVIEW OF SYSTEMS:  Constitutional: positive for fatigue Eyes: negative Ears, nose, mouth, throat, and face: negative Respiratory: positive for dyspnea on exertion Cardiovascular: negative Gastrointestinal: negative Genitourinary:negative Integument/breast: negative Hematologic/lymphatic: negative Musculoskeletal:negative Neurological: negative Behavioral/Psych: negative Endocrine: negative Allergic/Immunologic: negative   PHYSICAL EXAMINATION: General appearance: alert, cooperative, fatigued, and no distress Head: Normocephalic, without obvious abnormality,  atraumatic Neck: no adenopathy, no JVD, supple, symmetrical, trachea midline, and thyroid not enlarged, symmetric, no tenderness/mass/nodules Lymph nodes: Cervical, supraclavicular, and axillary nodes normal. Resp: diminished breath sounds RLL and dullness to percussion RLL Back: symmetric, no curvature. ROM normal. No CVA tenderness. Cardio: regular rate and rhythm, S1, S2 normal, no murmur, click, rub or gallop GI: soft, non-tender; bowel sounds normal; no masses,  no organomegaly Extremities: extremities normal, atraumatic, no cyanosis or edema Neurologic: Alert and oriented X 3, normal strength and tone. Normal symmetric reflexes. Normal coordination and gait  ECOG PERFORMANCE STATUS: 1 - Symptomatic but completely ambulatory  Blood pressure 138/61, pulse (!) 103, temperature 98 F (36.7 C), temperature source Oral, resp. rate 19, height 5' (1.524 m), weight 163 lb 5 oz (74.1 kg), last menstrual period 04/28/2000, SpO2 94%.  LABORATORY DATA: Lab Results  Component Value Date   WBC 8.1 02/11/2023   HGB 11.5 (L) 02/11/2023   HCT 37.4 02/11/2023   MCV 97.1 02/11/2023   PLT 262 02/11/2023      Chemistry      Component Value Date/Time   NA 138 02/11/2023 0856   K 4.2 02/11/2023 0856   CL 101 02/11/2023 0856   CO2 28 02/11/2023 0856   BUN 31 (H) 02/11/2023  1610   CREATININE 1.16 (H) 02/11/2023 0856   CREATININE 0.80 07/24/2017 1438      Component Value Date/Time   CALCIUM 9.1 02/11/2023 0856   ALKPHOS 95 02/11/2023 0856   AST 22 02/11/2023 0856   ALT 20 02/11/2023 0856   BILITOT 0.4 02/11/2023 0856       RADIOGRAPHIC STUDIES: CT CHEST ABDOMEN PELVIS W CONTRAST  Result Date: 02/17/2023 CLINICAL DATA:  Lung cancer restaging * Tracking Code: BO * EXAM: CT CHEST, ABDOMEN, AND PELVIS WITH CONTRAST TECHNIQUE: Multidetector CT imaging of the chest, abdomen and pelvis was performed following the standard protocol during bolus administration of intravenous contrast. RADIATION  DOSE REDUCTION: This exam was performed according to the departmental dose-optimization program which includes automated exposure control, adjustment of the mA and/or kV according to patient size and/or use of iterative reconstruction technique. CONTRAST:  OMNIPAQUE IOHEXOL 300 MG/ML  SOLN COMPARISON:  PET-CT, 11/06/2022 FINDINGS: CT CHEST FINDINGS Cardiovascular: Aortic atherosclerosis. Normal heart size. Left and right coronary artery calcifications. No pericardial effusion. Mediastinum/Nodes: Unchanged soft tissue thickening about the right hilum (series 2, image 23). No discretely enlarged mediastinal, hilar, or axillary lymph nodes. Thyroid gland, trachea, and esophagus demonstrate no significant findings. Lungs/Pleura: Similar appearance of a moderate, loculated right hydropneumothorax containing tunneled pleural drainage catheter. Unchanged post treatment appearance of the right lung with dense perihilar and right lower lobe fibrosis and consolidation. Slightly diminished size of a previously FDG avid spiculated nodule of the posterior left upper lobe, which tents the adjacent fissure and measures 0.9 x 0.7 cm, previously 1.2 x 0.8 cm when measured similarly (series 4, image 60). Interval placement of biopsy marking clip adjacent to this nodule (series 4, image 63). Substantially diminished solid character of a previously FDG avid consolidation of the anterior subpleural left upper lobe (series 4, image 64). Moderate underlying centrilobular emphysema. Scarring of the dependent left lower lobe. Musculoskeletal: No chest wall abnormality. No acute osseous findings. CT ABDOMEN PELVIS FINDINGS Hepatobiliary: No solid liver abnormality is seen. No gallstones, gallbladder wall thickening, or biliary dilatation. Pancreas: Unremarkable. No pancreatic ductal dilatation or surrounding inflammatory changes. Spleen: Normal in size without significant abnormality. Adrenals/Urinary Tract: Adrenal glands are  unremarkable. Kidneys are normal, without renal calculi, solid lesion, or hydronephrosis. Bladder is unremarkable. Stomach/Bowel: Stomach is within normal limits. Appendix not clearly visualized. No evidence of bowel wall thickening, distention, or inflammatory changes. Sigmoid diverticulosis. Vascular/Lymphatic: Aortic atherosclerosis. No enlarged abdominal or pelvic lymph nodes. Reproductive: No mass or other abnormality. Other: No abdominal wall hernia or abnormality. No ascites. Musculoskeletal: No acute osseous findings. IMPRESSION: 1. Slightly diminished size of a previously FDG avid spiculated nodule of the posterior left upper lobe, consistent with treatment response. Interval placement of biopsy marking clip adjacent to this nodule. 2. Substantially diminished solid character of a previously FDG avid consolidation of the anterior subpleural left upper lobe, likely reflecting radiation pneumonitis. Continued attention on follow-up. 3. Unchanged post treatment/post radiation appearance of the right lung with dense perihilar and right lower lobe radiation fibrosis and consolidation. Unchanged soft tissue thickening about the right hilum. 4. No evidence of lymphadenopathy or metastatic disease in the abdomen or pelvis. 5. Similar appearance of a moderate, loculated right hydropneumothorax containing tunneled pleural drainage catheter. 6. Coronary artery disease. Aortic Atherosclerosis (ICD10-I70.0) and Emphysema (ICD10-J43.9). Electronically Signed   By: Jearld Lesch M.D.   On: 02/17/2023 08:56   DG Chest 2 View  Result Date: 02/11/2023 CLINICAL DATA:  Follow-up right pleural effusion. EXAM: CHEST -  2 VIEW COMPARISON:  11/07/2022 FINDINGS: A small right hydropneumothorax shows no significant change in size. Right pleural catheter remains in position. Atelectasis or scarring is seen in the right lower lung, also unchanged. Left lung remains clear. Heart size is within normal limits IMPRESSION: No  significant change in size of small right hydropneumothorax, and right lower lung atelectasis versus scarring. Right pleural catheter remains in position. Electronically Signed   By: Danae Orleans M.D.   On: 02/11/2023 07:15    ASSESSMENT AND PLAN: This is a very pleasant 67 years old African-American female with metastatic non-small cell lung cancer initially diagnosed with a stage IIIA non-small cell lung cancer, adenocarcinoma.  She underwent a course of concurrent chemoradiation with weekly carboplatin and paclitaxel status post 7 cycles with partial response.   The patient tolerated this course of treatment well except for mild odynophagia and dysphagia. She completed on consolidation treatment with immunotherapy with Imfinzi (Durvalumab) status post 18 cycles. She also completed SBRT to the right supraclavicular lymphadenopathy. The patient had evidence for multiple brain metastasis in October 2020 and she underwent SRS treatment to this lesion under the care of Dr. Mitzi Hansen. The patient had evidence for disease progression and she started systemic chemotherapy with carboplatin, Alimta and Keytruda status post 35 cycles.  Starting from cycle #5 she is on maintenance treatment with Alimta and Keytruda every 3 weeks.  Starting from cycle #30 the patient is on treatment with single agent Keytruda 200 Mg IV every 3 weeks.  Alimta was discontinued secondary to intolerance. The patient has been on observation since May 2023 with no concerning new complaints. She recent underwent radiotherapy with IMRT to left upper lobe enlarging nodule under the care of Dr. Mitzi Hansen completed on 01/05/2023. The patient is currently on observation.  She had repeat CT scan of the chest, abdomen and pelvis performed recently.  I personally and independently reviewed the scan and discussed the result with the patient today. Her scan showed no concerning findings for disease progression.    Non-small cell lung cancer Initially  diagnosed in July 2019, treated with chemo, radiation, and Imfinzi. Subsequent spread treated with carboplatin, Alimta, and Keytruda. Recent increase in size of a nodule in the left upper lobe, treated with radiation. Current scan shows decrease in size of this nodule and no new growth. -Continue observation with next scan in approximately three months.  Chronic Obstructive Pulmonary Disease (COPD) Ongoing shortness of breath and fatigue, likely due to COPD and previous radiation treatments. -Continue current management under pulmonologist, Dr. Delton Coombes.  Anemia Ongoing fatigue, possibly related to anemia. Hemoglobin improved but still lower than normal. -Continue iron supplementation. -Consider physical therapy if tolerated. -Discuss referral to cardiopulmonary rehab with pulmonologist.  Possible Congestive Heart Failure (CHF) Patient reports swelling, which may indicate fluid accumulation. History of fluid around the heart. -Schedule follow-up with cardiologist, Dr. Francesco Runner, to assess for possible CHF.   For the iron deficiency anemia, she will continue her oral iron supplement with vitamin C or orange juice. The patient was advised to call immediately if she has any other concerning symptoms in the interval. The total time spent in the appointment was 40 minutes.  Disclaimer: This note was dictated with voice recognition software. Similar sounding words can inadvertently be transcribed and may not be corrected upon review.

## 2023-02-20 ENCOUNTER — Ambulatory Visit: Payer: Medicare PPO | Admitting: Adult Health

## 2023-02-23 ENCOUNTER — Encounter: Payer: Self-pay | Admitting: Nurse Practitioner

## 2023-02-23 ENCOUNTER — Ambulatory Visit: Payer: Medicare PPO | Attending: Adult Health | Admitting: Nurse Practitioner

## 2023-02-23 VITALS — BP 108/52 | HR 120 | Ht 66.0 in | Wt 161.0 lb

## 2023-02-23 DIAGNOSIS — I2510 Atherosclerotic heart disease of native coronary artery without angina pectoris: Secondary | ICD-10-CM | POA: Diagnosis not present

## 2023-02-23 DIAGNOSIS — I3139 Other pericardial effusion (noninflammatory): Secondary | ICD-10-CM | POA: Diagnosis not present

## 2023-02-23 DIAGNOSIS — I429 Cardiomyopathy, unspecified: Secondary | ICD-10-CM | POA: Diagnosis not present

## 2023-02-23 DIAGNOSIS — C3491 Malignant neoplasm of unspecified part of right bronchus or lung: Secondary | ICD-10-CM

## 2023-02-23 DIAGNOSIS — J910 Malignant pleural effusion: Secondary | ICD-10-CM

## 2023-02-23 DIAGNOSIS — R600 Localized edema: Secondary | ICD-10-CM

## 2023-02-23 DIAGNOSIS — J449 Chronic obstructive pulmonary disease, unspecified: Secondary | ICD-10-CM

## 2023-02-23 DIAGNOSIS — R000 Tachycardia, unspecified: Secondary | ICD-10-CM

## 2023-02-23 DIAGNOSIS — R0602 Shortness of breath: Secondary | ICD-10-CM

## 2023-02-23 NOTE — Progress Notes (Signed)
Office Visit    Patient Name: Susan Holder Date of Encounter: 02/23/2023  Primary Care Provider:  [name], MD Primary Cardiologist:  Garwin Brothers, MD  Chief Complaint    67 year old female with a history of coronary artery calcification noted on CT, cardiomyopathy, pericardial effusion, non-small cell lung cancer, malignant pleural effusion, COPD on home O2 at 2L/min, anemia, and rheumatoid arthritis who presents for follow-up related to lower extremity edema.  Past Medical History    Past Medical History:  Diagnosis Date   Acid reflux 09/21/2018   Adenocarcinoma of right lung, stage 4 (HCC) 11/17/2017   ADHD (attention deficit hyperactivity disorder)    patient denies this dx as of 03/10/22   Allergic rhinitis 08/22/2013   ANA positive 05/29/2016   Anemia    Aortic atherosclerosis (HCC) 10/23/2020   Brain metastases 03/29/2019   COPD (chronic obstructive pulmonary disease) (HCC)    Coronary artery calcification seen on CT scan 10/23/2020   patient is unaware of this dx as of 03/10/22   DDD (degenerative disc disease), cervical 05/29/2016   patient denies this dx as of 03/10/22   Dyspnea    oxygen 2L via [place]   Encounter for antineoplastic chemotherapy 11/17/2017   Encounter for antineoplastic immunotherapy 02/01/2018   Goals of care, counseling/discussion 11/17/2017   High risk medication use 05/29/2016   04/29/2016: ==> plq 200 am & 100 qhs(adeq response).   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   Other fatigue 05/29/2016   Oxygen dependent    2L via [place]   Pericardial effusion 10/23/2020   Pneumonia    as a child   Primary malignant neoplasm of bronchus of right lower lobe (HCC) 12/21/2017   Rheumatoid arthritis (HCC)     Rheumatoid arthritis involving multiple sites with positive rheumatoid factor (HCC) 05/29/2016   +RF +ANA +CCP    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   Smoker 05/29/2016   quit 2019   Trigger finger, left ring finger 05/29/2016   Trigger finger, right ring finger 05/29/2016   Vitamin D deficiency 05/29/2016   Past Surgical History:  Procedure Laterality Date   BRONCHIAL BIOPSY  03/11/2022   Procedure: BRONCHIAL BIOPSIES;  Surgeon: Omar Person, MD;  Location: [facility] ENDOSCOPY;  Service: Pulmonary;;   BRONCHIAL BIOPSY  11/07/2022   Procedure: BRONCHIAL BIOPSIES;  Surgeon: Omar Person, MD;  Location: [facility] ENDOSCOPY;  Service: Pulmonary;;   BRONCHIAL BRUSHINGS  11/07/2022   Procedure: BRONCHIAL BRUSHINGS;  Surgeon: Omar Person, MD;  Location: [facility] ENDOSCOPY;  Service: Pulmonary;;   BRONCHIAL NEEDLE ASPIRATION BIOPSY  11/09/2017   Procedure: BRONCHIAL NEEDLE ASPIRATION BIOPSIES;  Surgeon: Chilton Greathouse, MD;  Location: [facility] ENDOSCOPY;  Service: Cardiopulmonary;;   BRONCHIAL NEEDLE ASPIRATION BIOPSY  11/07/2022   Procedure: BRONCHIAL NEEDLE ASPIRATION BIOPSIES;  Surgeon: Omar Person, MD;  Location: [facility] ENDOSCOPY;  Service: Pulmonary;;   BRONCHIAL WASHINGS  03/11/2022   Procedure: BRONCHIAL WASHINGS;  Surgeon: Omar Person, MD;  Location: [facility] ENDOSCOPY;  Service: Pulmonary;;   CHEST TUBE INSERTION Left 05/13/2021   Procedure: INSERTION PLEURAL DRAINAGE CATHETER;  Surgeon: Corliss Skains, MD;  Location: [facility] OR;  Service: Thoracic;  Laterality: Left;   CHEST TUBE INSERTION  Right 03/11/2022   Procedure: INSERTION PLEURAL DRAINAGE CATHETER;  Surgeon: Omar Person, MD;  Location: [facility] ENDOSCOPY;  Service: Pulmonary;  Laterality: Right;  indwelling pleural catheter, w/ cuff   ENDOBRONCHIAL ULTRASOUND Bilateral 11/09/2017   Procedure: ENDOBRONCHIAL ULTRASOUND;  Surgeon: Chilton Greathouse, MD;  Location: [facility] ENDOSCOPY;  Service: Cardiopulmonary;  Laterality: Bilateral;    FIDUCIAL MARKER PLACEMENT  11/07/2022   Procedure: FIDUCIAL MARKER PLACEMENT;  Surgeon: Omar Person, MD;  Location: [facility] ENDOSCOPY;  Service: Pulmonary;;   NASAL SINUS SURGERY     NASAL SINUS SURGERY     THORACENTESIS Right 02/07/2021   Procedure: THORACENTESIS;  Surgeon: Corliss Skains, MD;  Location: [facility] OR;  Service: Thoracic;  Laterality: Right;   TUBAL LIGATION     VIDEO BRONCHOSCOPY  11/09/2017   Procedure: VIDEO BRONCHOSCOPY;  Surgeon: Chilton Greathouse, MD;  Location: [facility] ENDOSCOPY;  Service: Cardiopulmonary;;   VIDEO BRONCHOSCOPY Left 03/11/2022   Procedure: VIDEO BRONCHOSCOPY WITH FLUORO;  Surgeon: Omar Person, MD;  Location: [facility] ENDOSCOPY;  Service: Pulmonary;  Laterality: Left;   VIDEO BRONCHOSCOPY WITH RADIAL ENDOBRONCHIAL ULTRASOUND  11/07/2022   Procedure: VIDEO BRONCHOSCOPY WITH RADIAL ENDOBRONCHIAL ULTRASOUND;  Surgeon: Omar Person, MD;  Location: [facility] ENDOSCOPY;  Service: Pulmonary;;   XI ROBOTIC ASSISTED PERICARDIAL WINDOW Left 02/07/2021   Procedure: XI ROBOTIC ASSISTED THORACOSCOPY PERICARDIAL WINDOW;  Surgeon: Corliss Skains, MD;  Location: [facility] OR;  Service: Thoracic;  Laterality: Left;    Allergies  No Known Allergies   Labs/Other Studies Reviewed    The following studies were reviewed today:  Cardiac Studies & Procedures       ECHOCARDIOGRAM  ECHOCARDIOGRAM COMPLETE 11/09/2020  Narrative ECHOCARDIOGRAM REPORT    Patient Name:   Susan Holder Date of Exam: 11/09/2020 Medical Rec #:  [id number]         Height:       60.0 in Accession #:    [id number]        Weight:       109.8 lb Date of Birth:  03/25/1956          BSA:          1.447 m Patient Age:    67 years          BP:           102/72 mmHg Patient Gender: F                 HR:           117 bpm. Exam Location:  [address]  Procedure: 2D Echo, 3D Echo, Cardiac Doppler and Color Doppler  Indications:    I31.3 Pericardial effusion  History:        Patient has no prior  history of Echocardiogram examinations. Arrythmias:PVC; Risk Factors:Former Smoker. Pericardial effusion. Lung cancer. Brain metastasis.  Sonographer:    Garald Braver, RDCS Referring Phys: Rito Ehrlich [facility]  IMPRESSIONS   1. Left ventricular ejection fraction, by estimation, is 40 to 45%. The left ventricle has mildly decreased function. The left ventricle demonstrates global hypokinesis. The left ventricular internal cavity size was mildly dilated. Left ventricular diastolic parameters are consistent with Grade I diastolic dysfunction (impaired relaxation). 2. Right ventricular systolic function is moderately reduced. The right ventricular size is normal. There is normal pulmonary artery systolic pressure. 3. There is a moderate circumferential pericardial effusion. There is no collapse of the RV or RA. The MV inflow pattern is normal. There is no evidence of  pericardial tamponade but I would recommend close clinical follow up for further evaluation . Marland Kitchen Moderate pericardial effusion. The pericardial effusion is circumferential. There is no evidence of cardiac tamponade. 4. The mitral valve is grossly normal. No evidence of mitral valve regurgitation. 5. The aortic valve is grossly normal. Aortic valve regurgitation is moderate. 6. The IVC is normal size. There is inspiratory collapse of the IVC. Marland Kitchen The inferior vena cava is normal in size with greater than 50% respiratory variability, suggesting right atrial pressure of 3 mmHg.  FINDINGS Left Ventricle: Left ventricular ejection fraction, by estimation, is 40 to 45%. The left ventricle has mildly decreased function. The left ventricle demonstrates global hypokinesis. The left ventricular internal cavity size was mildly dilated. There is no left ventricular hypertrophy. Left ventricular diastolic parameters are consistent with Grade I diastolic dysfunction (impaired relaxation).  Right Ventricle: The right ventricular size is normal. Right  vetricular wall thickness was not well visualized. Right ventricular systolic function is moderately reduced. There is normal pulmonary artery systolic pressure. The tricuspid regurgitant velocity is 2.41 m/s, and with an assumed right atrial pressure of 3 mmHg, the estimated right ventricular systolic pressure is 26.2 mmHg.  Left Atrium: Left atrial size was normal in size.  Right Atrium: Right atrial size was normal in size.  Pericardium: There is a moderate circumferential pericardial effusion. There is no collapse of the RV or RA. The MV inflow pattern is normal. There is no evidence of pericardial tamponade but I would recommend close clinical follow up for further evaluation. A moderately sized pericardial effusion is present. The pericardial effusion is circumferential. There is no evidence of cardiac tamponade.  Mitral Valve: The mitral valve is grossly normal. No evidence of mitral valve regurgitation.  Tricuspid Valve: The tricuspid valve is grossly normal. Tricuspid valve regurgitation is not demonstrated.  Aortic Valve: The aortic valve is grossly normal. Aortic valve regurgitation is moderate. Aortic regurgitation PHT measures 422 msec.  Pulmonic Valve: The pulmonic valve was normal in structure. Pulmonic valve regurgitation is not visualized.  Aorta: The aortic root and ascending aorta are structurally normal, with no evidence of dilitation.  Venous: The IVC is normal size. There is inspiratory collapse of the IVC. The inferior vena cava is normal in size with greater than 50% respiratory variability, suggesting right atrial pressure of 3 mmHg.  IAS/Shunts: The atrial septum is grossly normal.   LEFT VENTRICLE PLAX 2D LVIDd:         3.80 cm  Diastology LVIDs:         2.80 cm  LV e' medial:    4.57 cm/s LV PW:         0.70 cm  LV E/e' medial:  8.2 LV IVS:        0.70 cm  LV e' lateral:   4.68 cm/s LVOT diam:     2.10 cm  LV E/e' lateral: 8.1 LV SV:         53 LV SV  Index:   36       2D Longitudinal Strain LVOT Area:     3.46 cm 2D Strain GLS (A2C):   -13.7 % 2D Strain GLS (A3C):   -16.1 % 2D Strain GLS (A4C):   -19.8 % 2D Strain GLS Avg:     -16.5 %  3D Volume EF: 3D EF:        52 % LV EDV:       92 ml LV ESV:  44 ml LV SV:        47 ml  RIGHT VENTRICLE RV Basal diam:  2.60 cm RV S prime:     14.90 cm/s TAPSE (M-mode): 1.1 cm RVSP:           26.2 mmHg  LEFT ATRIUM             Index      RIGHT ATRIUM           Index LA diam:        2.40 cm 1.66 cm/m RA Pressure: 3.00 mmHg LA Vol (A2C):   9.8 ml  6.80 ml/m RA Area:     8.09 cm LA Vol (A4C):   10.4 ml 7.19 ml/m RA Volume:   15.10 ml  10.44 ml/m LA Biplane Vol: 10.2 ml 7.05 ml/m AORTIC VALVE LVOT Vmax:   98.60 cm/s LVOT Vmean:  69.600 cm/s LVOT VTI:    0.152 m AI PHT:      422 msec  AORTA Ao Root diam: 2.80 cm Ao Asc diam:  3.20 cm  MITRAL VALVE               TRICUSPID VALVE TR Peak grad:   23.2 mmHg TR Vmax:        241.00 cm/s MV E velocity: 37.70 cm/s  Estimated RAP:  3.00 mmHg MV A velocity: 72.80 cm/s  RVSP:           26.2 mmHg MV E/A ratio:  0.52 SHUNTS Systemic VTI:  0.15 m Systemic Diam: 2.10 cm  Kristeen Miss MD Electronically signed by Kristeen Miss MD Signature Date/Time: 11/09/2020/4:55:43 PM    Final            Recent Labs: 06/17/2022: TSH 3.033 02/11/2023: ALT 20; BUN 31; Creatinine 1.16; Hemoglobin 11.5; Platelet Count 262; Potassium 4.2; Sodium 138  Recent Lipid Panel No results found for: "CHOL", "TRIG", "HDL", "CHOLHDL", "VLDL", "LDLCALC", "LDLDIRECT"  History of Present Illness    67 year old female with the above past medical history including coronary artery calcification noted on CT, cardiomyopathy, pericardial effusion, non-small cell lung cancer, malignant pleural effusion, COPD on home O2 at 2L/min, anemia, and rheumatoid arthritis.  Referred to cardiology in 09/2020 in the setting of cc pericardial effusion noted on CT scan.  There was  also evidence of coronary artery calcification and aortic atherosclerosis on CT.  She was also noted to have sinus tachycardia.  Echocardiogram in 10/2020 revealed EF 40 to 45%, mildly decreased LV function, LV global hypokinesis, G1 DD, moderately reduced RV, moderate circumferential pericardial effusion without evidence of tamponade.  She was last seen in the office on 11/21/2020 and was stable from a cardiac standpoint.  She was referred to CT surgery and underwent robotic assisted pericardial window in 01/2021.  Additionally, she has a history of non-small cell lung cancer with metastases to brain, malignant pleural effusion s/p Pleurx catheter.  She is following with pulmonology and oncology.  Her oncologist on 02/17/2023 and noted elevated heart rate, lower extremity edema.  She was advised to follow-up with cardiology.  She presents today for follow-up accompanied by her cousin.  Since her last visit She has been stable overall from a cardiac standpoint.  She has noted episodes of intermittent dizziness, generalized fatigue, generalized weakness. She notes chest heaviness when she drains her right Pleurx catheter, denies any other chest pressures. She does have mildly progressive dyspnea on exertion, stable nonpitting bilateral lower extremity/ankle edema. She denies PND, orthopnea, weight gain.  She  also notes new urinary incontinence over the past 5 days.  Her heart rate has been intermittently elevated, of note, she did recently complete a steroid taper.  She feels generally unwell.  Home Medications    Current Outpatient Medications  Medication Sig Dispense Refill   acetaminophen (TYLENOL) 500 MG tablet Take 1,000 mg by mouth every 6 (six) hours as needed for moderate pain.     ascorbic acid (VITAMIN C) 500 MG tablet Take 500 mg by mouth daily.     Cholecalciferol (VITAMIN D-3) 125 MCG (5000 UT) TABS Take 5,000 Units by mouth daily.     hydroxychloroquine (PLAQUENIL) 200 MG tablet Take 1 tablet  (200 mg total) by mouth daily. 90 tablet 0   omeprazole (PRILOSEC) 20 MG capsule Take 20 mg by mouth daily.     Tiotropium Bromide-Olodaterol (STIOLTO RESPIMAT) 2.5-2.5 MCG/ACT AERS Inhale 2 puffs into the lungs daily. 4 g 5   dexamethasone (DECADRON) 4 MG tablet Take one tablet dayl but follow taper instructions beginning 02/08/23 (Patient not taking: Reported on 02/23/2023) 30 tablet 1   No current facility-administered medications for this visit.     Review of Systems    She denies chest pain, palpitations, pnd, orthopnea, n, v, syncope, weight gain, or early satiety. All other systems reviewed and are otherwise negative except as noted above.   Physical Exam    VS:  BP (!) 108/52 (BP Location: Left Arm, Patient Position: Sitting, Cuff Size: Normal)   Pulse (!) 120   Ht 5\' 6"  (1.676 m)   Wt 161 lb (73 kg)   LMP 04/28/2000   SpO2 97%   BMI 25.99 kg/m  GEN: Well nourished, well developed, in no acute distress. HEENT: normal. Neck: Supple, no JVD, carotid bruits, or masses. Cardiac: RRR, no murmurs, rubs, or gallops. No clubbing, cyanosis, edema.  Radials/DP/PT 2+ and equal bilaterally.  Respiratory:  Respirations regular and unlabored, clear to auscultation bilaterally. GI: Soft, nontender, nondistended, BS + x 4. MS: no deformity or atrophy. Skin: warm and dry, no rash. Neuro:  Strength and sensation are intact. Psych: Normal affect.  Accessory Clinical Findings    ECG personally reviewed by me today - EKG Interpretation Date/Time:  Monday February 23 2023 10:37:11 EDT Ventricular Rate:  123 PR Interval:  96 QRS Duration:  66 QT Interval:  284 QTC Calculation: 406 R Axis:   65  Text Interpretation: Sinus tachycardia with short PR When compared with ECG of 07-Nov-2022 05:53, No significant change was found Confirmed by Bernadene Person ([id number]) on 02/23/2023 11:24:32 AM  - no acute changes.   Lab Results  Component Value Date   WBC 8.1 02/11/2023   HGB 11.5 (L) 02/11/2023    HCT 37.4 02/11/2023   MCV 97.1 02/11/2023   PLT 262 02/11/2023   Lab Results  Component Value Date   CREATININE 1.16 (H) 02/11/2023   BUN 31 (H) 02/11/2023   NA 138 02/11/2023   K 4.2 02/11/2023   CL 101 02/11/2023   CO2 28 02/11/2023   Lab Results  Component Value Date   ALT 20 02/11/2023   AST 22 02/11/2023   ALKPHOS 95 02/11/2023   BILITOT 0.4 02/11/2023   No results found for: "CHOL", "HDL", "LDLCALC", "LDLDIRECT", "TRIG", "CHOLHDL"  No results found for: "HGBA1C"  Assessment & Plan    1. History of pericardial effusion/Tachycardia: S/p pericardial window in 01/2021.  Recent increase in shortness of breath with exertion, sinus tachycardia.  CT of chest on 02/11/2023 with no  evidence of pericardial effusion.  EKG today shows sinus tachycardia.  She did recently complete a steroid taper.  She denies any significant palpitations, she does note dizziness with position changes, generalized fatigue, generalized weakness. Will repeat echocardiogram to rule out pericardial effusion given new tachycardia.  Reviewed ED precautions.  2. Bilateral lower extremity edema/Cardiomyopathy/dyspnea on exertion: Echo in 10/2020 revealed EF 40 to 45%, mildly decreased LV function, LV global hypokinesis, G1 DD, moderately reduced RV, moderate circumferential pericardial effusion without evidence of tamponade. Recent lower extremity edema. She does have mildly progressive dyspnea on exertion, stable nonpitting bilateral lower extremity/ankle edema, generally euvolemic on exam. Denies PND, orthopnea, weight gain.  Reviewed with Dr. Royann Shivers, who does not recommend Lasix at this time.  Suspect her symptoms of dyspnea, generalized weakness, fatigue are multifactorial.  Repeat echo pending as above.   3. Coronary artery calcification noted on CT: Stable with no anginal symptoms. No indication for ischemic evaluation.   4. COPD/non-small cell lung cancer/history of malignant pleural effusion: On home O2.  S/p R Pleurx catheter.  Following with pulmonology/oncology.   5. Disposition:  Follow-up in 2 to 3 months with Dr. Tomie China.      Joylene Grapes, NP 02/23/2023, 1:54 PM

## 2023-02-23 NOTE — Patient Instructions (Addendum)
Medication Instructions:  Your physician recommends that you continue on your current medications as directed. Please refer to the Current Medication list given to you today.  *If you need a refill on your cardiac medications before your next appointment, please call your pharmacy*   Lab Work: NONE ordered at this time of appointment   Testing/Procedures: Your physician has requested that you have an echocardiogram. Echocardiography is a painless test that uses sound waves to create images of your heart. It provides your doctor with information about the size and shape of your heart and how well your heart's chambers and valves are working. This procedure takes approximately one hour. There are no restrictions for this procedure. Please do NOT wear cologne, perfume, aftershave, or lotions (deodorant is allowed). Please arrive 15 minutes prior to your appointment time.    Follow-Up: At [facility], you and your health needs are our priority.  As part of our continuing mission to provide you with exceptional heart care, we have created designated Provider Care Teams.  These Care Teams include your primary Cardiologist (physician) and Advanced Practice Providers (APPs -  Physician Assistants and Nurse Practitioners) who all work together to provide you with the care you need, when you need it.  We recommend signing up for the patient portal called "MyChart".  Sign up information is provided on this After Visit Summary.  MyChart is used to connect with patients for Virtual Visits (Telemedicine).  Patients are able to view lab/test results, encounter notes, upcoming appointments, etc.  Non-urgent messages can be sent to your provider as well.   To learn more about what you can do with MyChart, go to [email or web address].    Your next appointment:   2-3 month(s)  Provider:   Belva Crome, MD

## 2023-02-24 ENCOUNTER — Ambulatory Visit
Admission: RE | Admit: 2023-02-24 | Discharge: 2023-02-24 | Disposition: A | Discharge status: Home / Self Care | Payer: Medicare PPO | Source: Ambulatory Visit | Attending: Radiation Oncology | Admitting: Radiation Oncology

## 2023-02-24 DIAGNOSIS — C801 Malignant (primary) neoplasm, unspecified: Secondary | ICD-10-CM | POA: Diagnosis not present

## 2023-02-24 DIAGNOSIS — C7931 Secondary malignant neoplasm of brain: Secondary | ICD-10-CM

## 2023-02-24 MED ORDER — GADOPICLENOL 0.5 MMOL/ML IV SOLN
7.5000 mL | Freq: Once | INTRAVENOUS | Status: AC | PRN
  Administered 2023-02-24: 7.5 mL via INTRAVENOUS

## 2023-02-25 DIAGNOSIS — C3490 Malignant neoplasm of unspecified part of unspecified bronchus or lung: Secondary | ICD-10-CM | POA: Diagnosis not present

## 2023-03-02 ENCOUNTER — Inpatient Hospital Stay: Payer: Medicare PPO

## 2023-03-05 ENCOUNTER — Encounter: Payer: Self-pay | Admitting: Radiation Oncology

## 2023-03-05 ENCOUNTER — Ambulatory Visit
Admission: RE | Admit: 2023-03-05 | Discharge: 2023-03-05 | Disposition: A | Discharge status: Home / Self Care | Payer: Medicare PPO | Source: Ambulatory Visit | Attending: Radiation Oncology | Admitting: Radiation Oncology

## 2023-03-05 DIAGNOSIS — C7931 Secondary malignant neoplasm of brain: Secondary | ICD-10-CM

## 2023-03-05 DIAGNOSIS — C3431 Malignant neoplasm of lower lobe, right bronchus or lung: Secondary | ICD-10-CM

## 2023-03-05 NOTE — Progress Notes (Signed)
Telephone nursing appointment for patient to review most recent MRI of Brain- 02/24/2023. I verified patient's identity x2 and began nursing interview.   Patient reports overall muscle fatigue/ weakness, and edema in bilateral ankles.   Dr. Si Gaul is aware of the edema and the issue is being addressed.  Patient denies any other related issues at this time.   Meaningful use complete.   Patient aware of their 9:30am telephone appointment w/ Laurence Aly PA-C. I left my extension [phone] in case patient needs anything. Patient verbalized understanding. This concludes the nursing interview.   Patient contact [phone]     Ruel Favors, LPN

## 2023-03-05 NOTE — Progress Notes (Addendum)
Radiation Oncology         [phone] ________________________________  Outpatient Follow Up - Conducted via telephone at patient request.  I spoke with the patient to conduct this visit via telephone. The patient was notified in advance and was offered an in person or telemedicine meeting to allow for face to face communication but instead preferred to proceed with a telephone visit.   Name: Susan Holder MRN: [id number]  Date of Service: 03/05/2023  DOB: 1955-06-21   DIAGNOSIS:   Progressive Metastatic Stage IIIA, cT3N1-2M0 NSCLC, adenocarcinoma of the right lower lobe with brain metastases   PREVIOUS RADIOTHERAPY:   12/31/22-01/05/23: The LUL target was treated to 54 Gy in 3 fractions with IMRT planning.   03/09/2019 SRS Treatment: Each site was treated to 20 Gy in a single fraction PTV1 Post Cerebellum 12mm PTV2 Lt Cerebellum 13mm PTV3 Lo Rt Vent 8mm PTV4 Rt Frontal 5mm PTV5 Lt Frontal Operc 5mm PTV6 Lt Sylv 5mm  11/16/2018-12/20/2018: The patient's right supraclavicular nodes were treated to 60 Gy over 25 fractions.   11/30/17-01/11/18:  66 Gy to the right chest and regional nodes over 33 fractions  NARRATIVE: Susan Holder is a well-known 67 y.o.  patient to our service with a history of adenocarcinoma of the right lower lobe.  She was originally diagnosed in the summer 2019 when she presented with local regional disease and received chemoradiation.  Recurrence was noted in the summer 2020, and she did receive palliative radiotherapy to the right supraclavicular node. In November 2020 she was found to have 6 lesions in the brain felt to be metastatic for which she underwent SRS treatment in November 2020. Her last immunotherapy was given on 09/04/2021.  She developed a right effusion which has been managed with a Pleurx catheter since 2023. Cytology has been negative for malignancy on the occasions her pleural fluid has been studied since development of the effusion. We have  been following inflammatory changes in the brain at sites of prior SRS, but no additional treatment has been given to the brain. In June 2024, a CT chest showed progressive changes in a nodule in the LUL measuring 1.2 cm, previously 7 mm (image 58, series 4). A CT Super D CT scan was performed on 11/06/22 showed a 13 x 8 mm irregular spiculated nodule in the posterior left upper lobe with involvement of the left fissure, additional there was subpleural patchy opacities in the left upper lobe and lower lobes favoring postinfectious or inflammatory changes, a 5 mm left lower lobe nodule was also seen and felt to be unchanged. PET scan on 11/06/2022 showed hypermetabolic activity with an SUV of 3.7 and the left lower lobe nodule which measured 13 mm.  Scattered subpleural patchy opacities in the left upper lobe favored infection/inflammation, and demonstrated hypermetabolic activity with an SUV of 6.2 in the anterior left upper lobe and 4.3 in the lateral aspect of the lingula.  The dominant opacity in the left upper lobe measured 1.1 x 2.1 cm.  No adenopathy was appreciated.  Biopsies from 11/07/2022 showed no malignant cells in the left upper lobe lavage, or in the left upper lobe fine-needle aspirate.  Brushings were also negative.  A fine-needle aspirate and what was labeled the lingula was also noted and felt to be consistent with adenocarcinoma.     Markings on lingula/posterior LUL below    Yellow:anterior site biopsied c/w granuloma  She underwent stereotactic body radiotherapy (SBRT) in September 2024. She also had an MRI brain  on 12/30/22 that showed an increase in the left cerebellar target that had previously been 13 mm at the time of treatment, now up to 16 mm with T2 hyperintensity with focal increase adjacent to the fourth ventricle noted and linear enhancement laterally in the left lateral ventricle measuring 10.5 mm, and stable to slight increase in the right frontal lobe lesion 5 mm at the time of  treatment in 2020 now measuring up to 9 mm. Surrounding vasogenic edema had increased slightly. No focal findings were felt to be a new. Dr. Mitzi Hansen personally reviewed her films and her case was also discussed in brain oncology conference and these changes were felt to still represent inflammatory component in the area where she has been previously treated. Given she had symptoms of difficulty with coordination over time, we started Dexamethasone 4 mg daily, and after about a month after noting some improvement in her symptoms, this was tapered. She completed this last week.     She had repeat MRI brain on 02/24/23 that showed unchanged size of the enhancing lesions in the left cerebellum, and basal right frontal lobe with slightly decreased surrounding vasogenic edema was noted. She is contacted by phone to review these results.     On review of systems, the patient reports she is doing poorly overall. She has been really struggling with her ability to get around physically at home. She spends most of her day in bed, and is having to rely on her family and friends to help care for her. She feels like this change has occurred somewhat abruptly in the last month or two. Despite this she reports her Pleurex catheter output is less, and she is only attempting to drain 2x a week, and sometimes there is very little to no output. She reports she has gained weight overall in the last two months, though it appears she is losing some of that weight based on weights at recent appointments. She denies any progressive episodes of dizziness, nausea, or difficulty with her gait. She felt like the steroids did help her with energy, but not specifically with breathing, or changes/improvement much in her dizziness/coordination. She is also concerned she is not fully able to use her inhaler medication. No other complaints are verbalized.   PAST MEDICAL HISTORY:  Past Medical History:  Diagnosis Date   Acid reflux  09/21/2018   Adenocarcinoma of right lung, stage 4 (HCC) 11/17/2017   ADHD (attention deficit hyperactivity disorder)    patient denies this dx as of 03/10/22   Allergic rhinitis 08/22/2013   ANA positive 05/29/2016   Anemia    Aortic atherosclerosis (HCC) 10/23/2020   Brain metastases 03/29/2019   COPD (chronic obstructive pulmonary disease) (HCC)    Coronary artery calcification seen on CT scan 10/23/2020   patient is unaware of this dx as of 03/10/22   DDD (degenerative disc disease), cervical 05/29/2016   patient denies this dx as of 03/10/22   Dyspnea    oxygen 2L via [place]   Encounter for antineoplastic chemotherapy 11/17/2017   Encounter for antineoplastic immunotherapy 02/01/2018   Goals of care, counseling/discussion 11/17/2017   High risk medication use 05/29/2016   04/29/2016: ==> plq 200 am & 100 qhs(adeq response).   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) 11/04/2018   Other fatigue 05/29/2016   Oxygen dependent    2L via [place]   Pericardial effusion 10/23/2020   Pneumonia    as a child   Primary malignant neoplasm of bronchus of right lower lobe (HCC) 12/21/2017   Rheumatoid arthritis (HCC)    Rheumatoid arthritis involving multiple sites with positive rheumatoid factor (HCC) 05/29/2016   +RF +ANA +CCP    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   Smoker 05/29/2016   quit 2019   Trigger finger, left ring finger 05/29/2016   Trigger finger, right ring finger 05/29/2016   Vitamin D deficiency 05/29/2016    PAST SURGICAL HISTORY: Past Surgical History:  Procedure Laterality Date   BRONCHIAL BIOPSY  03/11/2022   Procedure: BRONCHIAL BIOPSIES;  Surgeon: Omar Person, MD;  Location: [facility] ENDOSCOPY;  Service: Pulmonary;;   BRONCHIAL BIOPSY  11/07/2022    Procedure: BRONCHIAL BIOPSIES;  Surgeon: Omar Person, MD;  Location: [facility] ENDOSCOPY;  Service: Pulmonary;;   BRONCHIAL BRUSHINGS  11/07/2022   Procedure: BRONCHIAL BRUSHINGS;  Surgeon: Omar Person, MD;  Location: [facility] ENDOSCOPY;  Service: Pulmonary;;   BRONCHIAL NEEDLE ASPIRATION BIOPSY  11/09/2017   Procedure: BRONCHIAL NEEDLE ASPIRATION BIOPSIES;  Surgeon: Chilton Greathouse, MD;  Location: [facility] ENDOSCOPY;  Service: Cardiopulmonary;;   BRONCHIAL NEEDLE ASPIRATION BIOPSY  11/07/2022   Procedure: BRONCHIAL NEEDLE ASPIRATION BIOPSIES;  Surgeon: Omar Person, MD;  Location: [facility] ENDOSCOPY;  Service: Pulmonary;;   BRONCHIAL WASHINGS  03/11/2022   Procedure: BRONCHIAL WASHINGS;  Surgeon: Omar Person, MD;  Location: [facility] ENDOSCOPY;  Service: Pulmonary;;   CHEST TUBE INSERTION Left 05/13/2021   Procedure: INSERTION PLEURAL DRAINAGE CATHETER;  Surgeon: Corliss Skains, MD;  Location: [facility] OR;  Service: Thoracic;  Laterality: Left;   CHEST TUBE INSERTION Right 03/11/2022   Procedure: INSERTION PLEURAL DRAINAGE CATHETER;  Surgeon: Omar Person, MD;  Location: [facility] ENDOSCOPY;  Service: Pulmonary;  Laterality: Right;  indwelling pleural catheter, w/ cuff   ENDOBRONCHIAL ULTRASOUND Bilateral 11/09/2017   Procedure: ENDOBRONCHIAL ULTRASOUND;  Surgeon: Chilton Greathouse, MD;  Location: [facility] ENDOSCOPY;  Service: Cardiopulmonary;  Laterality: Bilateral;   FIDUCIAL MARKER PLACEMENT  11/07/2022   Procedure: FIDUCIAL MARKER PLACEMENT;  Surgeon: Omar Person, MD;  Location: [facility] ENDOSCOPY;  Service: Pulmonary;;   NASAL SINUS SURGERY     NASAL SINUS SURGERY     THORACENTESIS Right 02/07/2021   Procedure: THORACENTESIS;  Surgeon: Corliss Skains, MD;  Location: [facility] OR;  Service: Thoracic;  Laterality: Right;   TUBAL LIGATION     VIDEO BRONCHOSCOPY  11/09/2017   Procedure: VIDEO BRONCHOSCOPY;  Surgeon: Chilton Greathouse, MD;  Location: [facility] ENDOSCOPY;  Service: Cardiopulmonary;;   VIDEO BRONCHOSCOPY  Left 03/11/2022   Procedure: VIDEO BRONCHOSCOPY WITH FLUORO;  Surgeon: Omar Person, MD;  Location: [facility] ENDOSCOPY;  Service: Pulmonary;  Laterality: Left;   VIDEO BRONCHOSCOPY WITH RADIAL ENDOBRONCHIAL ULTRASOUND  11/07/2022   Procedure: VIDEO BRONCHOSCOPY WITH RADIAL ENDOBRONCHIAL ULTRASOUND;  Surgeon: Omar Person, MD;  Location: [facility] ENDOSCOPY;  Service: Pulmonary;;   XI ROBOTIC ASSISTED PERICARDIAL WINDOW Left 02/07/2021   Procedure: XI ROBOTIC ASSISTED THORACOSCOPY PERICARDIAL WINDOW;  Surgeon: Corliss Skains, MD;  Location: [facility] OR;  Service: Thoracic;  Laterality: Left;    PAST SOCIAL HISTORY:  Social History   Socioeconomic History   Marital status: Divorced    Spouse name: Not on file   Number of children: 1   Years of education: Not on file  Highest education level: Not on file  Occupational History   Not on file  Tobacco Use   Smoking status: Former    Current packs/day: 0.00    Average packs/day: 0.5 packs/day for 45.0 years (22.5 ttl pk-yrs)    Types: Cigarettes    Start date: 09/29/1972    Quit date: 09/29/2017    Years since quitting: 5.4    Passive exposure: Never   Smokeless tobacco: Never  Vaping Use   Vaping status: Never Used  Substance and Sexual Activity   Alcohol use: Not Currently   Drug use: No   Sexual activity: Not Currently    Birth control/protection: Post-menopausal  Other Topics Concern   Not on file  Social History Narrative   Not on file   Social Determinants of Health   Financial Resource Strain: Low Risk  (12/08/2022)   Overall Financial Resource Strain (CARDIA)    Difficulty of Paying Living Expenses: Not hard at all  Food Insecurity: No Food Insecurity (12/08/2022)   Hunger Vital Sign    Worried About Running Out of Food in the Last Year: Never true    Ran Out of Food in the Last Year: Never true  Transportation Needs: No Transportation Needs (12/08/2022)   PRAPARE - Administrator, Civil Service (Medical): No     Lack of Transportation (Non-Medical): No  Physical Activity: Inactive (12/08/2022)   Exercise Vital Sign    Days of Exercise per Week: 0 days    Minutes of Exercise per Session: 0 min  Stress: Stress Concern Present (12/08/2022)   [employer] of Occupational Health - Occupational Stress Questionnaire    Feeling of Stress : To some extent  Social Connections: Socially Isolated (12/08/2022)   Social Connection and Isolation Panel [NHANES]    Frequency of Communication with Friends and Family: More than three times a week    Frequency of Social Gatherings with Friends and Family: More than three times a week    Attends Religious Services: Never    Database administrator or Organizations: No    Attends Banker Meetings: Never    Marital Status: Divorced  Catering manager Violence: Not At Risk (12/08/2022)   Humiliation, Afraid, Rape, and Kick questionnaire    Fear of Current or Ex-Partner: No    Emotionally Abused: No    Physically Abused: No    Sexually Abused: No  The patient is divorced.  She lives in [place] and her elementary age granddaughter is also at home with her.  She is a retired Production designer, theatre/television/film for a [employer].  PAST FAMILY HISTORY: Family History  Problem Relation Age of Onset   Stroke Mother    Alzheimer's disease Mother    Heart disease Mother    Emphysema Father    Hypertension Brother    Heart attack Maternal Aunt    Heart failure Maternal Grandmother    Hypertension Paternal Grandmother     MEDICATIONS  Current Outpatient Medications  Medication Sig Dispense Refill   acetaminophen (TYLENOL) 500 MG tablet Take 1,000 mg by mouth every 6 (six) hours as needed for moderate pain.     ascorbic acid (VITAMIN C) 500 MG tablet Take 500 mg by mouth daily.     Cholecalciferol (VITAMIN D-3) 125 MCG (5000 UT) TABS Take 5,000 Units by mouth daily.     dexamethasone (DECADRON) 4 MG tablet Take one tablet dayl but follow taper instructions beginning  02/08/23 (Patient not taking: Reported on 02/23/2023) 30 tablet  1   hydroxychloroquine (PLAQUENIL) 200 MG tablet Take 1 tablet (200 mg total) by mouth daily. 90 tablet 0   omeprazole (PRILOSEC) 20 MG capsule Take 20 mg by mouth daily.     Tiotropium Bromide-Olodaterol (STIOLTO RESPIMAT) 2.5-2.5 MCG/ACT AERS Inhale 2 puffs into the lungs daily. 4 g 5   No current facility-administered medications for this encounter.    ALLERGIES: No Known Allergies  PHYSICAL EXAM:  Unable to assess due to encounter type.   IMPRESSION/PLAN: 1. Progressive Metastatic Stage IIIA, cT3N1-2M0 NSCLC, adenocarcinoma of the right lower lobe with brain metastases.  We discussed the MRI findings, and the rationale to continue to follow up with continued surveillance in 4-6 months. She is in agreement with this plan. She will continue to follow up with Dr. Arbutus Ped as well.   2. Pleural effusion, O2 dependant Emphysema.  The patient continues to be followed by [facility]. He is planning to revisit her output in the next interval to consider removing the pleurex. She is concerned about her respiratory symptoms. I will reach out as well to her pulmonary team.    This encounter was conducted via telephone.  The patient has provided two factor identification and has given verbal consent for this type of encounter and has been advised to only accept a meeting of this type in a secure network environment. The time spent during this encounter was 45 minutes including preparation, discussion, and coordination of the patient's care. The attendants for this meeting included Ronny Bacon  and [name].  During the encounter,  Ronny Bacon was located at [facility] Radiation Oncology Department.  Susan Holder was located at home.       Osker Mason, PAC

## 2023-03-08 ENCOUNTER — Emergency Department (HOSPITAL_COMMUNITY): Payer: Medicare PPO

## 2023-03-08 ENCOUNTER — Encounter (HOSPITAL_COMMUNITY): Payer: Self-pay | Admitting: *Deleted

## 2023-03-08 ENCOUNTER — Inpatient Hospital Stay (HOSPITAL_COMMUNITY)
Admission: EM | Admit: 2023-03-08 | Discharge: 2023-03-12 | DRG: 175 | Disposition: A | Discharge status: Skilled Nursing Facility | Payer: Medicare PPO | Attending: Internal Medicine | Admitting: Internal Medicine

## 2023-03-08 ENCOUNTER — Other Ambulatory Visit: Payer: Self-pay

## 2023-03-08 DIAGNOSIS — J9611 Chronic respiratory failure with hypoxia: Secondary | ICD-10-CM | POA: Diagnosis present

## 2023-03-08 DIAGNOSIS — J910 Malignant pleural effusion: Secondary | ICD-10-CM | POA: Diagnosis present

## 2023-03-08 DIAGNOSIS — Z79899 Other long term (current) drug therapy: Secondary | ICD-10-CM

## 2023-03-08 DIAGNOSIS — R0602 Shortness of breath: Secondary | ICD-10-CM | POA: Diagnosis not present

## 2023-03-08 DIAGNOSIS — C770 Secondary and unspecified malignant neoplasm of lymph nodes of head, face and neck: Secondary | ICD-10-CM | POA: Diagnosis present

## 2023-03-08 DIAGNOSIS — M0579 Rheumatoid arthritis with rheumatoid factor of multiple sites without organ or systems involvement: Secondary | ICD-10-CM | POA: Diagnosis present

## 2023-03-08 DIAGNOSIS — R1311 Dysphagia, oral phase: Secondary | ICD-10-CM | POA: Diagnosis not present

## 2023-03-08 DIAGNOSIS — K219 Gastro-esophageal reflux disease without esophagitis: Secondary | ICD-10-CM | POA: Diagnosis present

## 2023-03-08 DIAGNOSIS — J9621 Acute and chronic respiratory failure with hypoxia: Secondary | ICD-10-CM | POA: Diagnosis present

## 2023-03-08 DIAGNOSIS — D630 Anemia in neoplastic disease: Secondary | ICD-10-CM | POA: Diagnosis present

## 2023-03-08 DIAGNOSIS — D649 Anemia, unspecified: Secondary | ICD-10-CM | POA: Diagnosis not present

## 2023-03-08 DIAGNOSIS — I2510 Atherosclerotic heart disease of native coronary artery without angina pectoris: Secondary | ICD-10-CM | POA: Diagnosis present

## 2023-03-08 DIAGNOSIS — R0689 Other abnormalities of breathing: Secondary | ICD-10-CM | POA: Diagnosis not present

## 2023-03-08 DIAGNOSIS — Z820 Family history of epilepsy and other diseases of the nervous system: Secondary | ICD-10-CM

## 2023-03-08 DIAGNOSIS — R0789 Other chest pain: Secondary | ICD-10-CM | POA: Diagnosis not present

## 2023-03-08 DIAGNOSIS — Z9981 Dependence on supplemental oxygen: Secondary | ICD-10-CM | POA: Diagnosis not present

## 2023-03-08 DIAGNOSIS — R2681 Unsteadiness on feet: Secondary | ICD-10-CM | POA: Diagnosis not present

## 2023-03-08 DIAGNOSIS — I824Z3 Acute embolism and thrombosis of unspecified deep veins of distal lower extremity, bilateral: Secondary | ICD-10-CM | POA: Diagnosis not present

## 2023-03-08 DIAGNOSIS — E559 Vitamin D deficiency, unspecified: Secondary | ICD-10-CM | POA: Diagnosis present

## 2023-03-08 DIAGNOSIS — R262 Difficulty in walking, not elsewhere classified: Secondary | ICD-10-CM | POA: Diagnosis not present

## 2023-03-08 DIAGNOSIS — Z823 Family history of stroke: Secondary | ICD-10-CM

## 2023-03-08 DIAGNOSIS — Z923 Personal history of irradiation: Secondary | ICD-10-CM

## 2023-03-08 DIAGNOSIS — Z9221 Personal history of antineoplastic chemotherapy: Secondary | ICD-10-CM | POA: Diagnosis not present

## 2023-03-08 DIAGNOSIS — C3431 Malignant neoplasm of lower lobe, right bronchus or lung: Secondary | ICD-10-CM | POA: Diagnosis present

## 2023-03-08 DIAGNOSIS — I2694 Multiple subsegmental pulmonary emboli without acute cor pulmonale: Secondary | ICD-10-CM | POA: Diagnosis present

## 2023-03-08 DIAGNOSIS — Z825 Family history of asthma and other chronic lower respiratory diseases: Secondary | ICD-10-CM

## 2023-03-08 DIAGNOSIS — I2699 Other pulmonary embolism without acute cor pulmonale: Secondary | ICD-10-CM | POA: Diagnosis present

## 2023-03-08 DIAGNOSIS — I2609 Other pulmonary embolism with acute cor pulmonale: Secondary | ICD-10-CM | POA: Diagnosis not present

## 2023-03-08 DIAGNOSIS — R918 Other nonspecific abnormal finding of lung field: Secondary | ICD-10-CM | POA: Diagnosis not present

## 2023-03-08 DIAGNOSIS — R1111 Vomiting without nausea: Secondary | ICD-10-CM | POA: Diagnosis not present

## 2023-03-08 DIAGNOSIS — I82442 Acute embolism and thrombosis of left tibial vein: Secondary | ICD-10-CM | POA: Diagnosis present

## 2023-03-08 DIAGNOSIS — Z7951 Long term (current) use of inhaled steroids: Secondary | ICD-10-CM | POA: Diagnosis not present

## 2023-03-08 DIAGNOSIS — Z8249 Family history of ischemic heart disease and other diseases of the circulatory system: Secondary | ICD-10-CM

## 2023-03-08 DIAGNOSIS — J438 Other emphysema: Secondary | ICD-10-CM | POA: Diagnosis present

## 2023-03-08 DIAGNOSIS — M6281 Muscle weakness (generalized): Secondary | ICD-10-CM | POA: Diagnosis not present

## 2023-03-08 DIAGNOSIS — C7931 Secondary malignant neoplasm of brain: Secondary | ICD-10-CM | POA: Diagnosis present

## 2023-03-08 DIAGNOSIS — J90 Pleural effusion, not elsewhere classified: Secondary | ICD-10-CM | POA: Diagnosis not present

## 2023-03-08 DIAGNOSIS — R911 Solitary pulmonary nodule: Secondary | ICD-10-CM | POA: Diagnosis not present

## 2023-03-08 DIAGNOSIS — R5381 Other malaise: Secondary | ICD-10-CM | POA: Diagnosis present

## 2023-03-08 DIAGNOSIS — R7989 Other specified abnormal findings of blood chemistry: Secondary | ICD-10-CM | POA: Diagnosis present

## 2023-03-08 DIAGNOSIS — R079 Chest pain, unspecified: Secondary | ICD-10-CM | POA: Diagnosis present

## 2023-03-08 DIAGNOSIS — J449 Chronic obstructive pulmonary disease, unspecified: Secondary | ICD-10-CM | POA: Diagnosis not present

## 2023-03-08 DIAGNOSIS — J432 Centrilobular emphysema: Secondary | ICD-10-CM | POA: Diagnosis not present

## 2023-03-08 DIAGNOSIS — R778 Other specified abnormalities of plasma proteins: Secondary | ICD-10-CM | POA: Diagnosis not present

## 2023-03-08 DIAGNOSIS — I82451 Acute embolism and thrombosis of right peroneal vein: Secondary | ICD-10-CM | POA: Diagnosis present

## 2023-03-08 DIAGNOSIS — C3491 Malignant neoplasm of unspecified part of right bronchus or lung: Secondary | ICD-10-CM | POA: Diagnosis not present

## 2023-03-08 DIAGNOSIS — Z86711 Personal history of pulmonary embolism: Secondary | ICD-10-CM | POA: Diagnosis not present

## 2023-03-08 DIAGNOSIS — Z87891 Personal history of nicotine dependence: Secondary | ICD-10-CM

## 2023-03-08 DIAGNOSIS — I959 Hypotension, unspecified: Secondary | ICD-10-CM | POA: Diagnosis not present

## 2023-03-08 LAB — CBC
HCT: 36.7 % (ref 36.0–46.0)
Hemoglobin: 11.1 g/dL — ABNORMAL LOW (ref 12.0–15.0)
MCH: 29.5 pg (ref 26.0–34.0)
MCHC: 30.2 g/dL (ref 30.0–36.0)
MCV: 97.6 fL (ref 80.0–100.0)
Platelets: 332 10*3/uL (ref 150–400)
RBC: 3.76 MIL/uL — ABNORMAL LOW (ref 3.87–5.11)
RDW: 14.8 % (ref 11.5–15.5)
WBC: 8.5 10*3/uL (ref 4.0–10.5)
nRBC: 0 % (ref 0.0–0.2)

## 2023-03-08 LAB — BASIC METABOLIC PANEL
Anion gap: 10 (ref 5–15)
BUN: 15 mg/dL (ref 8–23)
CO2: 27 mmol/L (ref 22–32)
Calcium: 9.7 mg/dL (ref 8.9–10.3)
Chloride: 101 mmol/L (ref 98–111)
Creatinine, Ser: 0.98 mg/dL (ref 0.44–1.00)
GFR, Estimated: >60 mL/min (ref >60)
Glucose, Bld: 108 mg/dL — ABNORMAL HIGH (ref 70–99)
Potassium: 3.8 mmol/L (ref 3.5–5.1)
Sodium: 138 mmol/L (ref 135–145)

## 2023-03-08 LAB — TROPONIN I (HIGH SENSITIVITY): Troponin I (High Sensitivity): 381 ng/L (ref <18)

## 2023-03-08 MED ORDER — ASPIRIN 81 MG PO CHEW
324.0000 mg | CHEWABLE_TABLET | Freq: Once | ORAL | Status: AC
  Administered 2023-03-09: 324 mg via ORAL
  Filled 2023-03-08: qty 4

## 2023-03-08 NOTE — ED Triage Notes (Signed)
PT here [facility] from home for acute onset chest pain.  Pt vomited and pain improved.  Stated same type of episode last week.  Pt states she feels fine now.    Pt is on 4L  (recently increased her O2 herself from 2L).  Was briefly placed on 8L in ambulance d/t increased rr with transferring from chair to stretcher.   Recently seen cardiology np and scheduled for echo.  Awaiting visit with oncologist (lung ca) for recent increase in O2 needs.

## 2023-03-08 NOTE — ED Notes (Signed)
When attempting to transfer pt from wheelchair to stretcher, pt became tachypneic with rr in the upper 20's and her saturations dropped to 75 while on 4 L.  O2 was increased to 6 L and saturations slowly increased to 92 with rest.  Per pt, in the past few weeks she is becoming increasingly sob with any effort and has spent a lot of the last week in bed.

## 2023-03-08 NOTE — ED Provider Notes (Signed)
[place] EMERGENCY DEPARTMENT AT [facility] Provider Note   CSN: [id number] Arrival date & time: 03/08/23  2134     History {Add pertinent medical, surgical, social history, OB history to HPI:1} Chief Complaint  Patient presents with   Chest Pain    Susan Holder is a 67 y.o. female.  Patient with history of COPD on home oxygen, adenocarcinoma of the right lung, malignant pleural effusion with Pleurx catheter in place, history of pericardial effusion status post pericardial window, COPD presenting with chest pain.  Chest pain onset while she was resting today in the center of her chest.  Is associated with nausea and 1 episode of vomiting.  No increase in her chronic shortness of breath.  No change in her chronic cough.  No fever.  Chest pain resolved after about 30 minutes and is now gone.  There is no radiation of the pain to her neck but did have a "strange sensation" to her left arm.  Denies having this pain in the past though she did have an episode of similar pain yesterday that resolved on its own. No history of known CAD or stents. Saw cardiology October 28 and was being scheduled for echocardiogram.   The history is provided by the patient.  Chest Pain Associated symptoms: nausea, shortness of breath and vomiting   Associated symptoms: no abdominal pain, no dizziness, no headache and no weakness        Home Medications Prior to Admission medications   Medication Sig Start Date End Date Taking? Authorizing Provider  acetaminophen (TYLENOL) 500 MG tablet Take 1,000 mg by mouth every 6 (six) hours as needed for moderate pain.    [provider]  ascorbic acid (VITAMIN C) 500 MG tablet Take 500 mg by mouth daily.    [provider]  Cholecalciferol (VITAMIN D-3) 125 MCG (5000 UT) TABS Take 5,000 Units by mouth daily.    [provider]  hydroxychloroquine (PLAQUENIL) 200 MG tablet Take 1 tablet (200 mg total) by mouth daily.  02/10/23   Pollyann Savoy, MD  omeprazole (PRILOSEC) 20 MG capsule Take 20 mg by mouth daily.    [provider]  Tiotropium Bromide-Olodaterol (STIOLTO RESPIMAT) 2.5-2.5 MCG/ACT AERS Inhale 2 puffs into the lungs daily. 11/20/22   Noemi Chapel, NP      Allergies    Patient has no known allergies.    Review of Systems   Review of Systems  Constitutional:  Negative for activity change and appetite change.  HENT:  Negative for dental problem and rhinorrhea.   Respiratory:  Positive for chest tightness and shortness of breath.   Cardiovascular:  Positive for chest pain.  Gastrointestinal:  Positive for nausea and vomiting. Negative for abdominal pain.  Genitourinary:  Negative for dysuria and hematuria.  Musculoskeletal:  Negative for arthralgias and myalgias.  Skin:  Negative for rash.  Neurological:  Negative for dizziness, weakness and headaches.   all other systems are negative except as noted in the HPI and PMH.     Physical Exam Updated Vital Signs BP 111/62 (BP Location: Left Arm)   Pulse (!) 103   Temp 98.4 F (36.9 C) (Oral)   Resp 17   Ht 5\' 6"  (1.676 m)   Wt 68.5 kg   LMP 04/28/2000   SpO2 96%   BMI 24.37 kg/m  Physical Exam Vitals and nursing note reviewed.  Constitutional:      General: She is not in acute distress.  Appearance: She is well-developed. She is not ill-appearing.  HENT:     Head: Normocephalic and atraumatic.     Mouth/Throat:     Pharynx: No oropharyngeal exudate.  Eyes:     Conjunctiva/sclera: Conjunctivae normal.     Pupils: Pupils are equal, round, and reactive to light.  Neck:     Comments: No meningismus. Cardiovascular:     Rate and Rhythm: Normal rate and regular rhythm.     Heart sounds: Normal heart sounds. No murmur heard. Pulmonary:     Effort: Pulmonary effort is normal. No respiratory distress.     Comments: Right Pleurx catheter Diminished breath sounds right base Abdominal:     Palpations: Abdomen is  soft.     Tenderness: There is no abdominal tenderness. There is no guarding or rebound.  Musculoskeletal:        General: No tenderness. Normal range of motion.     Cervical back: Normal range of motion and neck supple.     Right lower leg: Edema present.     Left lower leg: Edema present.  Skin:    General: Skin is warm.  Neurological:     Mental Status: She is alert and oriented to person, place, and time.     Cranial Nerves: No cranial nerve deficit.     Motor: No abnormal muscle tone.     Coordination: Coordination normal.     Comments:  5/5 strength throughout. CN 2-12 intact.Equal grip strength.   Psychiatric:        Behavior: Behavior normal.     ED Results / Procedures / Treatments   Labs (all labs ordered are listed, but only abnormal results are displayed) Labs Reviewed  BASIC METABOLIC PANEL - Abnormal; Notable for the following components:      Result Value   Glucose, Bld 108 (*)    All other components within normal limits  CBC - Abnormal; Notable for the following components:   RBC 3.76 (*)    Hemoglobin 11.1 (*)    All other components within normal limits  TROPONIN I (HIGH SENSITIVITY) - Abnormal; Notable for the following components:   Troponin I (High Sensitivity) 381 (*)    All other components within normal limits    EKG EKG Interpretation Date/Time:  Sunday March 08 2023 21:45:07 EST Ventricular Rate:  100 PR Interval:  100 QRS Duration:  82 QT Interval:  346 QTC Calculation: 446 R Axis:   69  Text Interpretation: Sinus rhythm with short PR ST & T wave abnormality, consider anterior ischemia Abnormal ECG When compared with ECG of 23-Feb-2023 10:37, PREVIOUS ECG IS PRESENT new T wave inversions anteriorly Confirmed by Glynn Octave [phone]) on 03/08/2023 11:09:10 PM  Radiology No results found.  Procedures Procedures  {Document cardiac monitor, telemetry assessment procedure when appropriate:1}  Medications Ordered in ED Medications   aspirin chewable tablet 324 mg (has no administration in time range)    ED Course/ Medical Decision Making/ A&P   {   Click here for ABCD2, HEART and other calculatorsREFRESH Note before signing :1}                              Medical Decision Making Amount and/or Complexity of Data Reviewed Independent Historian: EMS Labs: ordered. Decision-making details documented in ED Course. Radiology: ordered and independent interpretation performed. Decision-making details documented in ED Course. ECG/medicine tests: ordered and independent interpretation performed. Decision-making details documented in ED Course.  Risk OTC drugs.   Patient with history lung CA, COPD, malignant pleural effusion presents with episode of chest pain now resolved.  Had similar episode yesterday.  EKG is concerning for new T wave inversions anteriorly.  Initial troponin positive at 381  Discussed with Dr. Orson Aloe of cardiology.  He reviewed patient's EKG.  He agrees concern for NSTEMI.  However patient has known brain metastasis and will need to be judicious about giving IV heparin.  Will also obtain CT scan to rule out pulmonary embolism.  {Document critical care time when appropriate:1} {Document review of labs and clinical decision tools ie heart score, Chads2Vasc2 etc:1}  {Document your independent review of radiology images, and any outside records:1} {Document your discussion with family members, caretakers, and with consultants:1} {Document social determinants of health affecting pt's care:1} {Document your decision making why or why not admission, treatments were needed:1} Final Clinical Impression(s) / ED Diagnoses Final diagnoses:  None    Rx / DC Orders ED Discharge Orders     None

## 2023-03-09 ENCOUNTER — Inpatient Hospital Stay (HOSPITAL_COMMUNITY): Payer: Medicare PPO

## 2023-03-09 ENCOUNTER — Emergency Department (HOSPITAL_COMMUNITY): Payer: Medicare PPO

## 2023-03-09 DIAGNOSIS — Z9981 Dependence on supplemental oxygen: Secondary | ICD-10-CM | POA: Diagnosis not present

## 2023-03-09 DIAGNOSIS — Z923 Personal history of irradiation: Secondary | ICD-10-CM | POA: Diagnosis not present

## 2023-03-09 DIAGNOSIS — R7989 Other specified abnormal findings of blood chemistry: Secondary | ICD-10-CM

## 2023-03-09 DIAGNOSIS — K219 Gastro-esophageal reflux disease without esophagitis: Secondary | ICD-10-CM | POA: Diagnosis present

## 2023-03-09 DIAGNOSIS — J9611 Chronic respiratory failure with hypoxia: Secondary | ICD-10-CM

## 2023-03-09 DIAGNOSIS — I82451 Acute embolism and thrombosis of right peroneal vein: Secondary | ICD-10-CM | POA: Diagnosis present

## 2023-03-09 DIAGNOSIS — Z87891 Personal history of nicotine dependence: Secondary | ICD-10-CM | POA: Diagnosis not present

## 2023-03-09 DIAGNOSIS — M0579 Rheumatoid arthritis with rheumatoid factor of multiple sites without organ or systems involvement: Secondary | ICD-10-CM

## 2023-03-09 DIAGNOSIS — I2699 Other pulmonary embolism without acute cor pulmonale: Secondary | ICD-10-CM | POA: Diagnosis present

## 2023-03-09 DIAGNOSIS — I2609 Other pulmonary embolism with acute cor pulmonale: Secondary | ICD-10-CM

## 2023-03-09 DIAGNOSIS — I2694 Multiple subsegmental pulmonary emboli without acute cor pulmonale: Secondary | ICD-10-CM | POA: Diagnosis present

## 2023-03-09 DIAGNOSIS — C3491 Malignant neoplasm of unspecified part of right bronchus or lung: Secondary | ICD-10-CM | POA: Diagnosis not present

## 2023-03-09 DIAGNOSIS — I2510 Atherosclerotic heart disease of native coronary artery without angina pectoris: Secondary | ICD-10-CM | POA: Diagnosis present

## 2023-03-09 DIAGNOSIS — Z86711 Personal history of pulmonary embolism: Secondary | ICD-10-CM

## 2023-03-09 DIAGNOSIS — I82442 Acute embolism and thrombosis of left tibial vein: Secondary | ICD-10-CM | POA: Diagnosis present

## 2023-03-09 DIAGNOSIS — Z79899 Other long term (current) drug therapy: Secondary | ICD-10-CM | POA: Diagnosis not present

## 2023-03-09 DIAGNOSIS — D630 Anemia in neoplastic disease: Secondary | ICD-10-CM | POA: Diagnosis present

## 2023-03-09 DIAGNOSIS — E559 Vitamin D deficiency, unspecified: Secondary | ICD-10-CM | POA: Diagnosis present

## 2023-03-09 DIAGNOSIS — J9621 Acute and chronic respiratory failure with hypoxia: Secondary | ICD-10-CM | POA: Diagnosis present

## 2023-03-09 DIAGNOSIS — J438 Other emphysema: Secondary | ICD-10-CM | POA: Diagnosis present

## 2023-03-09 DIAGNOSIS — C770 Secondary and unspecified malignant neoplasm of lymph nodes of head, face and neck: Secondary | ICD-10-CM | POA: Diagnosis present

## 2023-03-09 DIAGNOSIS — C3431 Malignant neoplasm of lower lobe, right bronchus or lung: Secondary | ICD-10-CM | POA: Diagnosis present

## 2023-03-09 DIAGNOSIS — C7931 Secondary malignant neoplasm of brain: Secondary | ICD-10-CM | POA: Diagnosis present

## 2023-03-09 DIAGNOSIS — Z9221 Personal history of antineoplastic chemotherapy: Secondary | ICD-10-CM | POA: Diagnosis not present

## 2023-03-09 DIAGNOSIS — J910 Malignant pleural effusion: Secondary | ICD-10-CM | POA: Diagnosis present

## 2023-03-09 DIAGNOSIS — Z823 Family history of stroke: Secondary | ICD-10-CM | POA: Diagnosis not present

## 2023-03-09 DIAGNOSIS — R5381 Other malaise: Secondary | ICD-10-CM | POA: Diagnosis present

## 2023-03-09 DIAGNOSIS — Z7951 Long term (current) use of inhaled steroids: Secondary | ICD-10-CM | POA: Diagnosis not present

## 2023-03-09 DIAGNOSIS — R079 Chest pain, unspecified: Secondary | ICD-10-CM | POA: Diagnosis present

## 2023-03-09 LAB — TROPONIN I (HIGH SENSITIVITY)
Troponin I (High Sensitivity): 380 ng/L (ref <18)
Troponin I (High Sensitivity): 427 ng/L (ref <18)
Troponin I (High Sensitivity): 694 ng/L (ref <18)

## 2023-03-09 LAB — ECHOCARDIOGRAM COMPLETE
Area-P 1/2: 3.83 cm2
Height: 66 in
P 1/2 time: 800 ms
S' Lateral: 2.9 cm
Weight: 2416 [oz_av]

## 2023-03-09 LAB — HEPARIN LEVEL (UNFRACTIONATED)
Heparin Unfractionated: >1.1 [IU]/mL — ABNORMAL HIGH (ref 0.30–0.70)
Heparin Unfractionated: 0.98 [IU]/mL — ABNORMAL HIGH (ref 0.30–0.70)

## 2023-03-09 LAB — HIV ANTIBODY (ROUTINE TESTING W REFLEX): HIV Screen 4th Generation wRfx: NONREACTIVE

## 2023-03-09 MED ORDER — ACETAMINOPHEN 500 MG PO TABS
1000.0000 mg | ORAL_TABLET | Freq: Four times a day (QID) | ORAL | Status: DC | PRN
  Administered 2023-03-10: 1000 mg via ORAL
  Filled 2023-03-09: qty 2

## 2023-03-09 MED ORDER — HEPARIN (PORCINE) 25000 UT/250ML-% IV SOLN
900.0000 [IU]/h | INTRAVENOUS | Status: DC
  Administered 2023-03-09: 900 [IU]/h via INTRAVENOUS
  Filled 2023-03-09: qty 250

## 2023-03-09 MED ORDER — HEPARIN (PORCINE) 25000 UT/250ML-% IV SOLN: 700.0000 [IU]/h | INTRAVENOUS | Status: DC

## 2023-03-09 MED ORDER — PANTOPRAZOLE SODIUM 40 MG PO TBEC
40.0000 mg | DELAYED_RELEASE_TABLET | Freq: Every day | ORAL | Status: DC
  Administered 2023-03-09 – 2023-03-12 (×4): 40 mg via ORAL
  Filled 2023-03-09 (×4): qty 1

## 2023-03-09 MED ORDER — HEPARIN (PORCINE) 25000 UT/250ML-% IV SOLN
1100.0000 [IU]/h | INTRAVENOUS | Status: DC
  Administered 2023-03-09: 1100 [IU]/h via INTRAVENOUS
  Filled 2023-03-09: qty 250

## 2023-03-09 MED ORDER — HEPARIN BOLUS VIA INFUSION
2000.0000 [IU] | Freq: Once | INTRAVENOUS | Status: AC
  Administered 2023-03-09: 2000 [IU] via INTRAVENOUS
  Filled 2023-03-09: qty 2000

## 2023-03-09 MED ORDER — ONDANSETRON HCL 4 MG PO TABS: 4.0000 mg | ORAL_TABLET | Freq: Four times a day (QID) | ORAL | Status: DC | PRN

## 2023-03-09 MED ORDER — ONDANSETRON HCL 4 MG/2ML IJ SOLN
4.0000 mg | Freq: Four times a day (QID) | INTRAMUSCULAR | Status: DC | PRN
  Administered 2023-03-10: 4 mg via INTRAVENOUS
  Filled 2023-03-09: qty 2

## 2023-03-09 MED ORDER — IOHEXOL 350 MG/ML SOLN
75.0000 mL | Freq: Once | INTRAVENOUS | Status: AC | PRN
  Administered 2023-03-09: 75 mL via INTRAVENOUS

## 2023-03-09 MED ORDER — HYDROXYCHLOROQUINE SULFATE 200 MG PO TABS
200.0000 mg | ORAL_TABLET | Freq: Every day | ORAL | Status: DC
  Administered 2023-03-09 – 2023-03-12 (×4): 200 mg via ORAL
  Filled 2023-03-09 (×4): qty 1

## 2023-03-09 NOTE — ED Notes (Signed)
ED TO INPATIENT HANDOFF REPORT  ED Nurse Name and Phone #: chris [phone] [email or web address]  S Name/Age/Gender Susan Holder 67 y.o. female Room/Bed: 015C/015C  Code Status   Code Status: Prior  Home/SNF/Other Home Patient oriented to: self, place, time, and situation Is this baseline? Yes   Triage Complete: Triage complete  Chief Complaint Chest Pain  Triage Note PT [facility] from home for acute onset chest pain.  Pt vomited and pain improved.  Stated same type of episode last week.  Pt states she feels fine now.    Pt is on 4L [place] (recently increased her O2 herself from 2L).  Was briefly placed on 8L in ambulance d/t increased rr with transferring from chair to stretcher.   Recently seen cardiology np and scheduled for echo.  Awaiting visit with oncologist (lung ca) for recent increase in O2 needs.    Allergies No Known Allergies  Level of Care/Admitting Diagnosis ED Disposition     None       B Medical/Surgery History Past Medical History:  Diagnosis Date   Acid reflux 09/21/2018   Adenocarcinoma of right lung, stage 4 (HCC) 11/17/2017   ADHD (attention deficit hyperactivity disorder)    patient denies this dx as of 03/10/22   Allergic rhinitis 08/22/2013   ANA positive 05/29/2016   Anemia    Aortic atherosclerosis (HCC) 10/23/2020   Brain metastases 03/29/2019   COPD (chronic obstructive pulmonary disease) (HCC)    Coronary artery calcification seen on CT scan 10/23/2020   patient is unaware of this dx as of 03/10/22   DDD (degenerative disc disease), cervical 05/29/2016   patient denies this dx as of 03/10/22   Dyspnea    oxygen 2L via [place]   Encounter for antineoplastic chemotherapy 11/17/2017   Encounter for antineoplastic immunotherapy 02/01/2018   Goals of care, counseling/discussion 11/17/2017   High risk medication use 05/29/2016   04/29/2016: ==> plq 200 am & 100 qhs(adeq response).   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) 11/04/2018   Other fatigue 05/29/2016   Oxygen dependent    2L via [place]   Pericardial effusion 10/23/2020   Pneumonia    as a child   Primary malignant neoplasm of bronchus of right lower lobe (HCC) 12/21/2017   Rheumatoid arthritis (HCC)    Rheumatoid arthritis involving multiple sites with positive rheumatoid factor (HCC) 05/29/2016   +RF +ANA +CCP    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   Smoker 05/29/2016   quit 2019   Trigger finger, left ring finger 05/29/2016   Trigger finger, right ring finger 05/29/2016   Vitamin D deficiency 05/29/2016   Past Surgical History:  Procedure Laterality Date   BRONCHIAL BIOPSY  03/11/2022   Procedure: BRONCHIAL BIOPSIES;  Surgeon: Omar Person, MD;  Location: [facility] ENDOSCOPY;  Service: Pulmonary;;   BRONCHIAL BIOPSY  11/07/2022   Procedure: BRONCHIAL BIOPSIES;  Surgeon: Omar Person, MD;  Location: [facility] ENDOSCOPY;  Service: Pulmonary;;   BRONCHIAL BRUSHINGS  11/07/2022   Procedure: BRONCHIAL BRUSHINGS;  Surgeon: Omar Person, MD;  Location: [facility] ENDOSCOPY;  Service: Pulmonary;;   BRONCHIAL NEEDLE ASPIRATION BIOPSY  11/09/2017   Procedure: BRONCHIAL NEEDLE ASPIRATION BIOPSIES;  Surgeon: Chilton Greathouse, MD;  Location: [facility] ENDOSCOPY;  Service: Cardiopulmonary;;   BRONCHIAL NEEDLE ASPIRATION BIOPSY  11/07/2022   Procedure: BRONCHIAL NEEDLE ASPIRATION BIOPSIES;  Surgeon: Omar Person, MD;  Location: [facility] ENDOSCOPY;  Service: Pulmonary;;   BRONCHIAL WASHINGS  03/11/2022   Procedure: BRONCHIAL WASHINGS;  Surgeon: Omar Person, MD;  Location: [facility] ENDOSCOPY;  Service: Pulmonary;;   CHEST TUBE INSERTION Left 05/13/2021   Procedure: INSERTION PLEURAL DRAINAGE CATHETER;  Surgeon: Corliss Skains, MD;  Location: [facility] OR;  Service:  Thoracic;  Laterality: Left;   CHEST TUBE INSERTION Right 03/11/2022   Procedure: INSERTION PLEURAL DRAINAGE CATHETER;  Surgeon: Omar Person, MD;  Location: [facility] ENDOSCOPY;  Service: Pulmonary;  Laterality: Right;  indwelling pleural catheter, w/ cuff   ENDOBRONCHIAL ULTRASOUND Bilateral 11/09/2017   Procedure: ENDOBRONCHIAL ULTRASOUND;  Surgeon: Chilton Greathouse, MD;  Location: [facility] ENDOSCOPY;  Service: Cardiopulmonary;  Laterality: Bilateral;   FIDUCIAL MARKER PLACEMENT  11/07/2022   Procedure: FIDUCIAL MARKER PLACEMENT;  Surgeon: Omar Person, MD;  Location: [facility] ENDOSCOPY;  Service: Pulmonary;;   NASAL SINUS SURGERY     NASAL SINUS SURGERY     THORACENTESIS Right 02/07/2021   Procedure: THORACENTESIS;  Surgeon: Corliss Skains, MD;  Location: [facility] OR;  Service: Thoracic;  Laterality: Right;   TUBAL LIGATION     VIDEO BRONCHOSCOPY  11/09/2017   Procedure: VIDEO BRONCHOSCOPY;  Surgeon: Chilton Greathouse, MD;  Location: [facility] ENDOSCOPY;  Service: Cardiopulmonary;;   VIDEO BRONCHOSCOPY Left 03/11/2022   Procedure: VIDEO BRONCHOSCOPY WITH FLUORO;  Surgeon: Omar Person, MD;  Location: [facility] ENDOSCOPY;  Service: Pulmonary;  Laterality: Left;   VIDEO BRONCHOSCOPY WITH RADIAL ENDOBRONCHIAL ULTRASOUND  11/07/2022   Procedure: VIDEO BRONCHOSCOPY WITH RADIAL ENDOBRONCHIAL ULTRASOUND;  Surgeon: Omar Person, MD;  Location: [facility] ENDOSCOPY;  Service: Pulmonary;;   XI ROBOTIC ASSISTED PERICARDIAL WINDOW Left 02/07/2021   Procedure: XI ROBOTIC ASSISTED THORACOSCOPY PERICARDIAL WINDOW;  Surgeon: Corliss Skains, MD;  Location: [facility] OR;  Service: Thoracic;  Laterality: Left;     A IV Location/Drains/Wounds Patient Lines/Drains/Airways Status     Active Line/Drains/Airways     Name Placement date Placement time Site Days   Peripheral IV 03/08/23 22 G Left;Posterior Hand 03/08/23  --  Hand  1   Chest Tube 1 Lateral;Right Pleural 15.5 Fr. 03/11/22  0955  Pleural  363             Intake/Output Last 24 hours No intake or output data in the 24 hours ending 03/09/23 0407  Labs/Imaging Results for orders placed or performed during the hospital encounter of 03/08/23 (from the past 48 hour(s))  Basic metabolic panel     Status: Abnormal   Collection Time: 03/08/23  9:57 PM  Result Value Ref Range   Sodium 138 135 - 145 mmol/L   Potassium 3.8 3.5 - 5.1 mmol/L   Chloride 101 98 - 111 mmol/L   CO2 27 22 - 32 mmol/L   Glucose, Bld 108 (H) 70 - 99 mg/dL    Comment: Glucose reference range applies only to samples taken after fasting for at least 8 hours.   BUN 15 8 - 23 mg/dL   Creatinine, Ser 1.30 0.44 - 1.00 mg/dL   Calcium 9.7 8.9 - 86.5 mg/dL   GFR, Estimated >78 >46 mL/min    Comment: (NOTE) Calculated using the CKD-EPI Creatinine Equation (2021)    Anion gap 10 5 - 15    Comment: Performed at [facility], [address] [place], [place] [address]  CBC     Status: Abnormal   Collection Time: 03/08/23  9:57 PM  Result Value Ref Range   WBC 8.5 4.0 - 10.5 K/uL   RBC 3.76 (L) 3.87 - 5.11 MIL/uL   Hemoglobin 11.1 (L) 12.0 - 15.0 g/dL   HCT 57.3 22.0 - 25.4 %   MCV 97.6 80.0 - 100.0 fL   MCH 29.5 26.0 - 34.0 pg   MCHC 30.2 30.0 - 36.0 g/dL   RDW 27.0 62.3 - 76.2 %   Platelets 332 150 - 400 K/uL   nRBC 0.0 0.0 - 0.2 %    Comment: Performed at [facility], [address] [place], [place] [address]  Troponin I (High Sensitivity)     Status: Abnormal   Collection Time: 03/08/23  9:57 PM  Result Value Ref Range   Troponin I (High Sensitivity) 381 (HH) <18 ng/L    Comment: CRITICAL RESULT CALLED TO, READ BACK BY AND VERIFIED WITH Trinidi Toppins, C. RN @ 2245 03/08/23 JBUTLER (NOTE) Elevated high sensitivity troponin I (hsTnI) values and significant  changes across serial measurements may suggest ACS but many other  chronic and acute conditions are known to elevate hsTnI results.  Refer to the "Links" section for chest pain algorithms and additional   guidance. Performed at [facility], [address] [place], [place] [address]   Troponin I (High Sensitivity)     Status: Abnormal   Collection Time: 03/08/23 11:55 PM  Result Value Ref Range   Troponin I (High Sensitivity) 380 (HH) <18 ng/L    Comment: CRITICAL VALUE NOTED. VALUE IS CONSISTENT WITH PREVIOUSLY REPORTED/CALLED VALUE (NOTE) Elevated high sensitivity troponin I (hsTnI) values and significant  changes across serial measurements may suggest ACS but many other  chronic and acute conditions are known to elevate hsTnI results.  Refer to the "Links" section for chest pain algorithms and additional  guidance. Performed at [facility], [address] [place], [place] [address]    DG Chest Portable 1 View  Result Date: 03/08/2023 CLINICAL DATA:  Chest pain and shortness of breath EXAM: PORTABLE CHEST 1 VIEW COMPARISON:  Radiograph 01/22/2023 and CT 02/11/2023 FINDINGS: Right chest tube in the moderate-to-large partially loculated right pleural effusion which is grossly similar to CT chest 02/11/2023. Associated airspace and interstitial opacities the right mid and lower lung. Interstitial thickening in the left mid and lower lung. Small left pleural effusion. No pneumothorax. IMPRESSION: 1. Right chest tube in the moderate-to-large partially loculated right pleural effusion which is grossly similar to CT chest 02/11/2023. 2. Airspace and interstitial opacities in the bilateral mid and lower lungs are slightly increased compared to radiographs 01/22/2023 and are suspicious for edema or infection. Electronically Signed   By: Minerva Fester M.D.   On: 03/08/2023 23:39    Pending Labs Unresulted Labs (From admission, onward)    None       Vitals/Pain Today's Vitals   03/09/23 0049 03/09/23 0100 03/09/23 0103 03/09/23 0145  BP:  (!) 112/55  134/68  Pulse:  95  94  Resp:  20  17  Temp:  (!) 88.2 F (31.2 C) 98.2 F (36.8 C)   TempSrc:      SpO2:  100%  100%   Weight:      Height:      PainSc: 5        Isolation Precautions No active isolations  Medications Medications  aspirin chewable tablet 324 mg (324 mg Oral Given 03/09/23  4098)    Mobility walks with device     Focused Assessments Cardiac Assessment Handoff:    No results found for: "CKTOTAL", "CKMB", "CKMBINDEX", "TROPONINI" No results found for: "DDIMER" Does the Patient currently have chest pain? No    R Recommendations: See Admitting Provider Note  Report given to:   Additional Notes:

## 2023-03-09 NOTE — Assessment & Plan Note (Addendum)
Pt with acute PE in LUL, non-occlusive, no RHS findings on CT at this time. Presumably this the cause of her trop elevation today. As well as mild tachycardia. Low dose heparin gtt per pharm Low dose at this time given pt has known brain mets Brain mets are obviously at risk of bleeding (though no history of such in this patient), but given high risk of recurrent PEs and death from recurrent PEs (in this pt with stage 4 adenocarcinoma of lung), I feel that benefits of trying to treat with [facility] at this time outweigh risks. Sent secure chat to Dr. Myna Hidalgo (on call for Dr. Arbutus Ped), and added Dr. Arbutus Ped to chart so pt shows up on list. Tele monitor 2d echo US DVT

## 2023-03-09 NOTE — ED Notes (Signed)
Pt got up to the bedside commode and desatted into the 70's on 4L [place]. Got pt back in bed and placed her on a non-rebreather at 10L. Pt's O2 sats up to 100%. Pt back on 4L [place] and satting in upper 90's.

## 2023-03-09 NOTE — Progress Notes (Signed)
PHARMACY - ANTICOAGULATION CONSULT NOTE  Pharmacy Consult for IV heparin Indication: pulmonary embolus  No Known Allergies  Patient Measurements: Height: 5\' 6"  (167.6 cm) Weight: 68.5 kg (151 lb) IBW/kg (Calculated) : 59.3 Heparin Dosing Weight: 68.5 kg  Vital Signs: Temp: 98.2 F (36.8 C) (11/11 2000) Temp Source: Axillary (11/11 2000) BP: 102/61 (11/11 2000) Pulse Rate: 104 (11/11 2001)  Labs: Recent Labs    03/08/23 2157 03/08/23 2355 03/09/23 0829 03/09/23 1354 03/09/23 1708 03/09/23 2251  HGB 11.1*  --   --   --   --   --   HCT 36.7  --   --   --   --   --   PLT 332  --   --   --   --   --   HEPARINUNFRC  --   --   --  >1.10*  --  0.98*  CREATININE 0.98  --   --   --   --   --   TROPONINIHS 381* 380* 427*  --  694*  --     Estimated Creatinine Clearance: 50.7 mL/min (by C-G formula based on SCr of 0.98 mg/dL).   Medical History: Past Medical History:  Diagnosis Date   Acid reflux 09/21/2018   Adenocarcinoma of right lung, stage 4 (HCC) 11/17/2017   ADHD (attention deficit hyperactivity disorder)    patient denies this dx as of 03/10/22   Allergic rhinitis 08/22/2013   ANA positive 05/29/2016   Anemia    Aortic atherosclerosis (HCC) 10/23/2020   Brain metastases 03/29/2019   COPD (chronic obstructive pulmonary disease) (HCC)    Coronary artery calcification seen on CT scan 10/23/2020   patient is unaware of this dx as of 03/10/22   DDD (degenerative disc disease), cervical 05/29/2016   patient denies this dx as of 03/10/22   Dyspnea    oxygen 2L via [place]   Encounter for antineoplastic chemotherapy 11/17/2017   Encounter for antineoplastic immunotherapy 02/01/2018   Goals of care, counseling/discussion 11/17/2017   High risk medication use 05/29/2016   04/29/2016: ==> plq 200 am & 100 qhs(adeq response).   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) 11/04/2018   Other fatigue 05/29/2016   Oxygen dependent    2L via [place]   Pericardial effusion 10/23/2020   Pneumonia    as a child   Primary malignant neoplasm of bronchus of right lower lobe (HCC) 12/21/2017   Rheumatoid arthritis (HCC)    Rheumatoid arthritis involving multiple sites with positive rheumatoid factor (HCC) 05/29/2016   +RF +ANA +CCP    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 year old female admitted on 03/08/2023 with concern for PE. Of note, PMH significant for NSCLC with brain mets present. No anticoagulation prior to admission. Pharmacy consulted to dose heparin.  HL supratherapeutic at >1.1 to 0.98 (drawn ~6h after heparin restarted). Discussed with phlebotomist and patient, level was drawn from opposite arm from infusion. No new bleeding noted.   Goal of Therapy:  Heparin  level 0.3 - 0.5 units/ml Monitor platelets by anticoagulation protocol: Yes   Plan:  Hold heparin infusion for 30 minutes Decrease heparin to 750 units/hr and to start at midnight on 11/12 Follow up heparin level at 8 hours Daily heparin level, CBC, and monitoring for bleeding F/u plans for anticoagulation   Thank you for allowing pharmacy to participate in this patient's care.  Arabella Merles, PharmD. Clinical Pharmacist 03/09/2023 11:31 PM

## 2023-03-09 NOTE — Progress Notes (Signed)
PHARMACY - ANTICOAGULATION CONSULT NOTE  Pharmacy Consult for IV heparin Indication: pulmonary embolus  No Known Allergies  Patient Measurements: Height: 5\' 6"  (167.6 cm) Weight: 68.5 kg (151 lb) IBW/kg (Calculated) : 59.3 Heparin Dosing Weight: 68.5 kg  Vital Signs: Temp: 97.1 F (36.2 C) (11/11 1437) Temp Source: Oral (11/11 1437) BP: 132/73 (11/11 1530) Pulse Rate: 103 (11/11 1530)  Labs: Recent Labs    03/08/23 2157 03/08/23 2355 03/09/23 0829 03/09/23 1354  HGB 11.1*  --   --   --   HCT 36.7  --   --   --   PLT 332  --   --   --   HEPARINUNFRC  --   --   --  >1.10*  CREATININE 0.98  --   --   --   TROPONINIHS 381* 380* 427*  --     Estimated Creatinine Clearance: 50.7 mL/min (by C-G formula based on SCr of 0.98 mg/dL).   Medical History: Past Medical History:  Diagnosis Date   Acid reflux 09/21/2018   Adenocarcinoma of right lung, stage 4 (HCC) 11/17/2017   ADHD (attention deficit hyperactivity disorder)    patient denies this dx as of 03/10/22   Allergic rhinitis 08/22/2013   ANA positive 05/29/2016   Anemia    Aortic atherosclerosis (HCC) 10/23/2020   Brain metastases 03/29/2019   COPD (chronic obstructive pulmonary disease) (HCC)    Coronary artery calcification seen on CT scan 10/23/2020   patient is unaware of this dx as of 03/10/22   DDD (degenerative disc disease), cervical 05/29/2016   patient denies this dx as of 03/10/22   Dyspnea    oxygen 2L via [place]   Encounter for antineoplastic chemotherapy 11/17/2017   Encounter for antineoplastic immunotherapy 02/01/2018   Goals of care, counseling/discussion 11/17/2017   High risk medication use 05/29/2016   04/29/2016: ==> plq 200 am & 100 qhs(adeq response).   History of blood transfusion    x 2   Hoarseness 03/16/2018   Left hand pain 03/06/2017   Lung mass    Malnutrition of moderate degree 02/09/2021   met lung ca dx'd 09/2017   neck LN and brain 2020   Metastasis to supraclavicular lymph  node (HCC) 11/04/2018   Other fatigue 05/29/2016   Oxygen dependent    2L via [place]   Pericardial effusion 10/23/2020   Pneumonia    as a child   Primary malignant neoplasm of bronchus of right lower lobe (HCC) 12/21/2017   Rheumatoid arthritis (HCC)    Rheumatoid arthritis involving multiple sites with positive rheumatoid factor (HCC) 05/29/2016   +RF +ANA +CCP    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 year old female admitted on 03/08/2023 with concern for PE. Of note, PMH significant for NSCLC with brain mets present. No anticoagulation prior to admission. Pharmacy consulted to dose heparin.  HL supratherapeutic at >1.10. Discussed with phlebotomist and patient, level was drawn from opposite arm from infusion. No new bleeding noted.   Goal of Therapy:  Heparin level 0.3 - 0.5 units/ml Monitor platelets by anticoagulation protocol: Yes   Plan:  Hold heparin infusion x1 hour Resume heparin at 900 units/hr (roughly 3 u/kg/h decrease) at 1700 Follow up heparin level at 8 hours Daily heparin level, CBC, and  monitoring for bleeding F/u plans for anticoagulation   Thank you for allowing pharmacy to participate in this patient's care.  Rutherford Nail, PharmD PGY2 Critical Care Pharmacy Resident 03/09/2023,3:56 PM

## 2023-03-09 NOTE — ED Notes (Addendum)
ED TO INPATIENT HANDOFF REPORT  ED Nurse Name and Phone #:   S Name/Age/Gender Susan Holder 67 y.o. female Room/Bed: 015C/015C  Code Status   Code Status: Full Code  Home/SNF/Other Home Patient oriented to: self, place, time, and situation Is this baseline? Yes   Triage Complete: Triage complete  Chief Complaint Acute pulmonary embolism without acute cor pulmonale (HCC) [I26.99]  Triage Note PT [facility] from [facility] for acute onset chest pain.  Pt vomited and pain improved.  Stated same type of episode last week.  Pt states she feels fine now.    Pt is on 4L [place] (recently increased her O2 herself from 2L).  Was briefly placed on 8L in ambulance d/t increased rr with transferring from chair to stretcher.   Recently seen cardiology np and scheduled for echo.  Awaiting visit with oncologist (lung ca) for recent increase in O2 needs.    Allergies No Known Allergies  Level of Care/Admitting Diagnosis ED Disposition     ED Disposition  Admit   Condition  --   Comment  Hospital Area: [facility] [[id number]]  Level of Care: Telemetry Cardiac [[id number]]  May place patient in observation at [facility] or [facility] if equivalent level of care is available:: No  Covid Evaluation: Asymptomatic - no recent exposure (last 10 days) testing not required  Diagnosis: Acute pulmonary embolism without acute cor pulmonale [facility]) [[id number]]  Admitting Physician: Hillary Bow [phone]  Attending Physician: Hillary Bow [[id number]]          B Medical/Surgery History Past Medical History:  Diagnosis Date   Acid reflux 09/21/2018   Adenocarcinoma of right lung, stage 4 (HCC) 11/17/2017   ADHD (attention deficit hyperactivity disorder)    patient denies this dx as of 03/10/22   Allergic rhinitis 08/22/2013   ANA positive 05/29/2016   Anemia    Aortic atherosclerosis (HCC) 10/23/2020   Brain metastases 03/29/2019   COPD (chronic obstructive pulmonary disease)  (HCC)    Coronary artery calcification seen on CT scan 10/23/2020   patient is unaware of this dx as of 03/10/22   DDD (degenerative disc disease), cervical 05/29/2016   patient denies this dx as of 03/10/22   Dyspnea    oxygen 2L via [place]   Encounter for antineoplastic chemotherapy 11/17/2017   Encounter for antineoplastic immunotherapy 02/01/2018   Goals of care, counseling/discussion 11/17/2017   High risk medication use 05/29/2016   04/29/2016: ==> plq 200 am & 100 qhs(adeq response).   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   Other fatigue 05/29/2016   Oxygen dependent    2L via [place]   Pericardial effusion 10/23/2020   Pneumonia    as a child   Primary malignant neoplasm of bronchus of right lower lobe (HCC) 12/21/2017   Rheumatoid arthritis (HCC)    Rheumatoid arthritis involving multiple sites with positive rheumatoid factor (HCC) 05/29/2016   +RF +ANA +CCP    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   Smoker 05/29/2016   quit 2019   Trigger finger, left ring finger 05/29/2016   Trigger finger, right ring finger 05/29/2016   Vitamin D deficiency 05/29/2016   Past Surgical History:  Procedure Laterality  Date   BRONCHIAL BIOPSY  03/11/2022   Procedure: BRONCHIAL BIOPSIES;  Surgeon: Omar Person, MD;  Location: [facility] ENDOSCOPY;  Service: Pulmonary;;   BRONCHIAL BIOPSY  11/07/2022   Procedure: BRONCHIAL BIOPSIES;  Surgeon: Omar Person, MD;  Location: [facility] ENDOSCOPY;  Service: Pulmonary;;   BRONCHIAL BRUSHINGS  11/07/2022   Procedure: BRONCHIAL BRUSHINGS;  Surgeon: Omar Person, MD;  Location: [facility] ENDOSCOPY;  Service: Pulmonary;;   BRONCHIAL NEEDLE ASPIRATION BIOPSY  11/09/2017   Procedure: BRONCHIAL NEEDLE ASPIRATION BIOPSIES;  Surgeon: Chilton Greathouse, MD;  Location: [facility] ENDOSCOPY;  Service: Cardiopulmonary;;   BRONCHIAL NEEDLE ASPIRATION BIOPSY  11/07/2022   Procedure: BRONCHIAL NEEDLE ASPIRATION BIOPSIES;  Surgeon: Omar Person, MD;  Location: [facility] ENDOSCOPY;  Service: Pulmonary;;   BRONCHIAL WASHINGS  03/11/2022   Procedure: BRONCHIAL WASHINGS;  Surgeon: Omar Person, MD;  Location: [facility] ENDOSCOPY;  Service: Pulmonary;;   CHEST TUBE INSERTION Left 05/13/2021   Procedure: INSERTION PLEURAL DRAINAGE CATHETER;  Surgeon: Corliss Skains, MD;  Location: [facility] OR;  Service: Thoracic;  Laterality: Left;   CHEST TUBE INSERTION Right 03/11/2022   Procedure: INSERTION PLEURAL DRAINAGE CATHETER;  Surgeon: Omar Person, MD;  Location: [facility] ENDOSCOPY;  Service: Pulmonary;  Laterality: Right;  indwelling pleural catheter, w/ cuff   ENDOBRONCHIAL ULTRASOUND Bilateral 11/09/2017   Procedure: ENDOBRONCHIAL ULTRASOUND;  Surgeon: Chilton Greathouse, MD;  Location: [facility] ENDOSCOPY;  Service: Cardiopulmonary;  Laterality: Bilateral;   FIDUCIAL MARKER PLACEMENT  11/07/2022   Procedure: FIDUCIAL MARKER PLACEMENT;  Surgeon: Omar Person, MD;  Location: [facility] ENDOSCOPY;  Service: Pulmonary;;   NASAL SINUS SURGERY     NASAL SINUS SURGERY     THORACENTESIS Right 02/07/2021   Procedure: THORACENTESIS;  Surgeon: Corliss Skains, MD;  Location: [facility] OR;  Service: Thoracic;  Laterality: Right;   TUBAL LIGATION     VIDEO BRONCHOSCOPY  11/09/2017   Procedure: VIDEO BRONCHOSCOPY;  Surgeon: Chilton Greathouse, MD;  Location: [facility] ENDOSCOPY;  Service: Cardiopulmonary;;   VIDEO BRONCHOSCOPY Left 03/11/2022   Procedure: VIDEO BRONCHOSCOPY WITH FLUORO;  Surgeon: Omar Person, MD;  Location: [facility] ENDOSCOPY;  Service: Pulmonary;  Laterality: Left;   VIDEO BRONCHOSCOPY WITH RADIAL ENDOBRONCHIAL ULTRASOUND  11/07/2022   Procedure: VIDEO BRONCHOSCOPY WITH RADIAL ENDOBRONCHIAL ULTRASOUND;  Surgeon: Omar Person, MD;  Location: [facility] ENDOSCOPY;  Service: Pulmonary;;    XI ROBOTIC ASSISTED PERICARDIAL WINDOW Left 02/07/2021   Procedure: XI ROBOTIC ASSISTED THORACOSCOPY PERICARDIAL WINDOW;  Surgeon: Corliss Skains, MD;  Location: [facility] OR;  Service: Thoracic;  Laterality: Left;     A IV Location/Drains/Wounds Patient Lines/Drains/Airways Status     Active Line/Drains/Airways     Name Placement date Placement time Site Days   Peripheral IV 03/08/23 22 G Left;Posterior Hand 03/08/23  --  Hand  1   Peripheral IV 03/09/23 20 G 1.88" Left;Upper Arm 03/09/23  0457  Arm  less than 1   Chest Tube 1 Lateral;Right Pleural 15.5 Fr. 03/11/22  0955  Pleural  363            Intake/Output Last 24 hours No intake or output data in the 24 hours ending 03/09/23 0552  Labs/Imaging Results for orders placed or performed during the hospital encounter of 03/08/23 (from the past 48 hour(s))  Basic metabolic panel     Status: Abnormal   Collection Time: 03/08/23  9:57 PM  Result Value Ref Range   Sodium 138 135 - 145 mmol/L   Potassium 3.8 3.5 - 5.1 mmol/L  Chloride 101 98 - 111 mmol/L   CO2 27 22 - 32 mmol/L   Glucose, Bld 108 (H) 70 - 99 mg/dL    Comment: Glucose reference range applies only to samples taken after fasting for at least 8 hours.   BUN 15 8 - 23 mg/dL   Creatinine, Ser 4.54 0.44 - 1.00 mg/dL   Calcium 9.7 8.9 - 09.8 mg/dL   GFR, Estimated >11 >91 mL/min    Comment: (NOTE) Calculated using the CKD-EPI Creatinine Equation (2021)    Anion gap 10 5 - 15    Comment: Performed at [facility], [address], [place], [place] [address]  CBC     Status: Abnormal   Collection Time: 03/08/23  9:57 PM  Result Value Ref Range   WBC 8.5 4.0 - 10.5 K/uL   RBC 3.76 (L) 3.87 - 5.11 MIL/uL   Hemoglobin 11.1 (L) 12.0 - 15.0 g/dL   HCT 56.2 13.0 - 86.5 %   MCV 97.6 80.0 - 100.0 fL   MCH 29.5 26.0 - 34.0 pg   MCHC 30.2 30.0 - 36.0 g/dL   RDW 78.4 69.6 - 29.5 %   Platelets 332 150 - 400 K/uL   nRBC 0.0 0.0 - 0.2 %    Comment: Performed at  [facility], [address] [place], [place] [address]  Troponin I (High Sensitivity)     Status: Abnormal   Collection Time: 03/08/23  9:57 PM  Result Value Ref Range   Troponin I (High Sensitivity) 381 (HH) <18 ng/L    Comment: CRITICAL RESULT CALLED TO, READ BACK BY AND VERIFIED WITH CHRISCO, C. RN @ 2245 03/08/23 JBUTLER (NOTE) Elevated high sensitivity troponin I (hsTnI) values and significant  changes across serial measurements may suggest ACS but many other  chronic and acute conditions are known to elevate hsTnI results.  Refer to the "Links" section for chest pain algorithms and additional  guidance. Performed at [facility], [address], [place], [place] [address]   Troponin I (High Sensitivity)     Status: Abnormal   Collection Time: 03/08/23 11:55 PM  Result Value Ref Range   Troponin I (High Sensitivity) 380 (HH) <18 ng/L    Comment: CRITICAL VALUE NOTED. VALUE IS CONSISTENT WITH PREVIOUSLY REPORTED/CALLED VALUE (NOTE) Elevated high sensitivity troponin I (hsTnI) values and significant  changes across serial measurements may suggest ACS but many other  chronic and acute conditions are known to elevate hsTnI results.  Refer to the "Links" section for chest pain algorithms and additional  guidance. Performed at [facility], [address], [place], [place] [address]    CT Angio Chest PE W and/or Wo Contrast  Result Date: 03/09/2023 CLINICAL DATA:  67 year old female with history of acute onset of chest pain. Evaluate for pulmonary embolism. History of lung cancer. * Tracking Code: BO * EXAM: CT ANGIOGRAPHY CHEST WITH CONTRAST TECHNIQUE: Multidetector CT imaging of the chest was performed using the standard protocol during bolus administration of intravenous contrast. Multiplanar CT image reconstructions and MIPs were obtained to evaluate the vascular anatomy. RADIATION DOSE REDUCTION: This exam was performed according to the departmental  dose-optimization program which includes automated exposure control, adjustment of the mA and/or kV according to patient size and/or use of iterative reconstruction technique. CONTRAST:  75mL OMNIPAQUE IOHEXOL 350 MG/ML SOLN COMPARISON:  Chest CT 02/11/2023. FINDINGS: Cardiovascular: There are multiple filling defects in the left-sided pulmonary arterial tree involving segmental and subsegmental sized  pulmonary artery branches to the left upper lobe, indicative of pulmonary embolism. These appear nonocclusive at this time. No definite right-sided filling defects are noted. Heart size is normal. There is no significant pericardial fluid, thickening or pericardial calcification. Atherosclerotic calcifications in the thoracic aorta as well as the left main, left anterior descending and right coronary arteries. Mediastinum/Nodes: Prominent nodal tissue is noted measuring up to 1.4 cm in short axis in the subcarinal region. Architectural distortion in the right hilar region noted. No definite discrete hilar lymphadenopathy confidently identified. Esophagus is unremarkable in appearance. No axillary lymphadenopathy. Lungs/Pleura: Mass-like architectural distortion in the perihilar aspect of the right lung, likely reflective of chronic postradiation mass-like fibrosis. Right-sided chest tube in position with tip extending into the posterior aspect of the upper right hemithorax within a large right-sided pleural effusion which has some associated pleural enhancement. In the posterior aspect of the left upper lobe (axial image 53 of series 10) there is a 9 x 9 mm nodule adjacent to a fiducial marker, corresponding to the partially treated neoplasm. Areas of ground-glass attenuation and septal thickening are noted in the surrounding lung, likely to reflect evolving postradiation pneumonitis. No left pleural effusion. No other new suspicious appearing pulmonary nodules or masses are noted. Diffuse bronchial wall thickening  with moderate centrilobular and mild paraseptal emphysema. Upper Abdomen: Aortic atherosclerosis. Musculoskeletal: There are no aggressive appearing lytic or blastic lesions noted in the visualized portions of the skeleton. Review of the MIP images confirms the above findings. IMPRESSION: 1. Study is positive for pulmonary embolism with multiple nonocclusive segmental and subsegmental sized filling defects to the left upper lobe, as above. 2. Right-sided chest tube stable in position with tip in the posterior aspect of the upper right hemithorax. Large chronic right pleural effusion with pleural enhancement noted. 3. Treated nodule in the posterior aspect of the left upper lobe again noted, with evolving postradiation changes in the surrounding lung parenchyma. 4. Diffuse bronchial wall thickening with moderate centrilobular and mild paraseptal emphysema; imaging findings suggestive of underlying COPD. 5. Aortic atherosclerosis, in addition to left main and 2 vessel coronary artery disease. Please note that although the presence of coronary artery calcium documents the presence of coronary artery disease, the severity of this disease and any potential stenosis cannot be assessed on this non-gated CT examination. Assessment for potential risk factor modification, dietary therapy or pharmacologic therapy may be warranted, if clinically indicated. Critical Value/emergent results were discussed by telephone at the time of interpretation on 03/09/2023 at 5:44 am to provider Dr. Julian Reil, who verbally acknowledged these results. Aortic Atherosclerosis (ICD10-I70.0) and Emphysema (ICD10-J43.9). Electronically Signed   By: Trudie Reed M.D.   On: 03/09/2023 05:44   DG Chest Portable 1 View  Result Date: 03/08/2023 CLINICAL DATA:  Chest pain and shortness of breath EXAM: PORTABLE CHEST 1 VIEW COMPARISON:  Radiograph 01/22/2023 and CT 02/11/2023 FINDINGS: Right chest tube in the moderate-to-large partially loculated  right pleural effusion which is grossly similar to CT chest 02/11/2023. Associated airspace and interstitial opacities the right mid and lower lung. Interstitial thickening in the left mid and lower lung. Small left pleural effusion. No pneumothorax. IMPRESSION: 1. Right chest tube in the moderate-to-large partially loculated right pleural effusion which is grossly similar to CT chest 02/11/2023. 2. Airspace and interstitial opacities in the bilateral mid and lower lungs are slightly increased compared to radiographs 01/22/2023 and are suspicious for edema or infection. Electronically Signed   By: Minerva Fester M.D.   On: 03/08/2023  23:39    Pending Labs Unresulted Labs (From admission, onward)     Start     Ordered   03/10/23 0500  CBC  Tomorrow morning,   R        03/09/23 0548   03/10/23 0500  Basic metabolic panel  Tomorrow morning,   R        03/09/23 0548   03/09/23 0548  HIV Antibody (routine testing w rflx)  (HIV Antibody (Routine testing w reflex) panel)  Once,   R        03/09/23 0548            Vitals/Pain Today's Vitals   03/09/23 0145 03/09/23 0400 03/09/23 0428 03/09/23 0550  BP: 134/68 (!) 124/56  122/70  Pulse: 94 100  96  Resp: 17 20  20   Temp:   98 F (36.7 C) (!) 97 F (36.1 C)  TempSrc:    Oral  SpO2: 100% 99%  100%  Weight:      Height:      PainSc:        Isolation Precautions No active isolations  Medications Medications  hydroxychloroquine (PLAQUENIL) tablet 200 mg (has no administration in time range)  pantoprazole (PROTONIX) EC tablet 40 mg (has no administration in time range)  acetaminophen (TYLENOL) tablet 1,000 mg (has no administration in time range)  ondansetron (ZOFRAN) tablet 4 mg (has no administration in time range)    Or  ondansetron (ZOFRAN) injection 4 mg (has no administration in time range)  aspirin chewable tablet 324 mg (324 mg Oral Given 03/09/23 0054)  iohexol (OMNIPAQUE) 350 MG/ML injection 75 mL (75 mLs Intravenous  Contrast Given 03/09/23 0530)    Mobility walks     Focused Assessments Pulmonary Assessment Handoff:  Lung sounds:   O2 Device: Nasal Cannula O2 Flow Rate (L/min): 4 L/min    R Recommendations: See Admitting Provider Note  Report given to:   Additional Notes:  patient sats will drop with excursion

## 2023-03-09 NOTE — H&P (Signed)
History and Physical    Patient: Susan Holder:[id number] DOB: 03/20/1956 DOA: 03/08/2023 DOS: the patient was seen and examined on 03/09/2023 PCP: [name], MD  Patient coming from: Home  Chief Complaint:  Chief Complaint  Patient presents with   Chest Pain   HPI: Susan Holder is a 67 y.o. female with medical history significant of NSCLC with metastatic dz to brain, pericardial effusion s/p pericardial window, pleural effusion s/p Pleurex catheter.  Pt in to ED with c/o acute onset chest pain.  1 episode of vomiting, pain improved.  Had similar episode last week.  Pt pain free at this time.  Pt on 4L O2 via [place], recently had to go up from her baseline 2L at home.  Gets very SOB with minimal activity.  Recently saw cards in office on 10/28, they noted s.tach with HR 120, scheduled her for 2d echo.  CP free at this time.  Review of Systems: As mentioned in the history of present illness. All other systems reviewed and are negative. Past Medical History:  Diagnosis Date   Acid reflux 09/21/2018   Adenocarcinoma of right lung, stage 4 (HCC) 11/17/2017   ADHD (attention deficit hyperactivity disorder)    patient denies this dx as of 03/10/22   Allergic rhinitis 08/22/2013   ANA positive 05/29/2016   Anemia    Aortic atherosclerosis (HCC) 10/23/2020   Brain metastases 03/29/2019   COPD (chronic obstructive pulmonary disease) (HCC)    Coronary artery calcification seen on CT scan 10/23/2020   patient is unaware of this dx as of 03/10/22   DDD (degenerative disc disease), cervical 05/29/2016   patient denies this dx as of 03/10/22   Dyspnea    oxygen 2L via [place]   Encounter for antineoplastic chemotherapy 11/17/2017   Encounter for antineoplastic immunotherapy 02/01/2018   Goals of care, counseling/discussion 11/17/2017   High risk medication use 05/29/2016   04/29/2016: ==> plq 200 am & 100 qhs(adeq response).   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) 11/04/2018   Other fatigue 05/29/2016   Oxygen dependent    2L via [place]   Pericardial effusion 10/23/2020   Pneumonia    as a child   Primary malignant neoplasm of bronchus of right lower lobe (HCC) 12/21/2017   Rheumatoid arthritis (HCC)    Rheumatoid arthritis involving multiple sites with positive rheumatoid factor (HCC) 05/29/2016   +RF +ANA +CCP    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   Smoker 05/29/2016   quit 2019   Trigger finger, left ring finger 05/29/2016   Trigger finger, right ring finger 05/29/2016   Vitamin D deficiency 05/29/2016   Past Surgical History:  Procedure Laterality Date   BRONCHIAL BIOPSY  03/11/2022   Procedure: BRONCHIAL BIOPSIES;  Surgeon: Omar Person, MD;  Location: [facility] ENDOSCOPY;  Service: Pulmonary;;   BRONCHIAL BIOPSY  11/07/2022   Procedure: BRONCHIAL BIOPSIES;  Surgeon: Omar Person, MD;  Location: [facility] ENDOSCOPY;  Service: Pulmonary;;   BRONCHIAL BRUSHINGS  11/07/2022   Procedure: BRONCHIAL BRUSHINGS;  Surgeon: Omar Person, MD;  Location: [facility] ENDOSCOPY;  Service: Pulmonary;;   BRONCHIAL NEEDLE ASPIRATION BIOPSY  11/09/2017   Procedure: BRONCHIAL NEEDLE ASPIRATION BIOPSIES;  Surgeon: Chilton Greathouse,  MD;  Location: [facility] ENDOSCOPY;  Service: Cardiopulmonary;;   BRONCHIAL NEEDLE ASPIRATION BIOPSY  11/07/2022   Procedure: BRONCHIAL NEEDLE ASPIRATION BIOPSIES;  Surgeon: Omar Person, MD;  Location: [facility] ENDOSCOPY;  Service: Pulmonary;;   BRONCHIAL WASHINGS  03/11/2022   Procedure: BRONCHIAL WASHINGS;  Surgeon: Omar Person, MD;  Location: [facility] ENDOSCOPY;  Service: Pulmonary;;   CHEST TUBE INSERTION Left 05/13/2021   Procedure: INSERTION PLEURAL DRAINAGE CATHETER;  Surgeon: Corliss Skains, MD;  Location: [facility] OR;   Service: Thoracic;  Laterality: Left;   CHEST TUBE INSERTION Right 03/11/2022   Procedure: INSERTION PLEURAL DRAINAGE CATHETER;  Surgeon: Omar Person, MD;  Location: [facility] ENDOSCOPY;  Service: Pulmonary;  Laterality: Right;  indwelling pleural catheter, w/ cuff   ENDOBRONCHIAL ULTRASOUND Bilateral 11/09/2017   Procedure: ENDOBRONCHIAL ULTRASOUND;  Surgeon: Chilton Greathouse, MD;  Location: [facility] ENDOSCOPY;  Service: Cardiopulmonary;  Laterality: Bilateral;   FIDUCIAL MARKER PLACEMENT  11/07/2022   Procedure: FIDUCIAL MARKER PLACEMENT;  Surgeon: Omar Person, MD;  Location: [facility] ENDOSCOPY;  Service: Pulmonary;;   NASAL SINUS SURGERY     NASAL SINUS SURGERY     THORACENTESIS Right 02/07/2021   Procedure: THORACENTESIS;  Surgeon: Corliss Skains, MD;  Location: [facility] OR;  Service: Thoracic;  Laterality: Right;   TUBAL LIGATION     VIDEO BRONCHOSCOPY  11/09/2017   Procedure: VIDEO BRONCHOSCOPY;  Surgeon: Chilton Greathouse, MD;  Location: [facility] ENDOSCOPY;  Service: Cardiopulmonary;;   VIDEO BRONCHOSCOPY Left 03/11/2022   Procedure: VIDEO BRONCHOSCOPY WITH FLUORO;  Surgeon: Omar Person, MD;  Location: [facility] ENDOSCOPY;  Service: Pulmonary;  Laterality: Left;   VIDEO BRONCHOSCOPY WITH RADIAL ENDOBRONCHIAL ULTRASOUND  11/07/2022   Procedure: VIDEO BRONCHOSCOPY WITH RADIAL ENDOBRONCHIAL ULTRASOUND;  Surgeon: Omar Person, MD;  Location: [facility] ENDOSCOPY;  Service: Pulmonary;;   XI ROBOTIC ASSISTED PERICARDIAL WINDOW Left 02/07/2021   Procedure: XI ROBOTIC ASSISTED THORACOSCOPY PERICARDIAL WINDOW;  Surgeon: Corliss Skains, MD;  Location: [facility] OR;  Service: Thoracic;  Laterality: Left;   Social History:  reports that she quit smoking about 5 years ago. Her smoking use included cigarettes. She started smoking about 50 years ago. She has a 22.5 pack-year smoking history. She has never been exposed to tobacco smoke. She has never used smokeless tobacco. She reports that she does not currently use  alcohol. She reports that she does not use drugs.  No Known Allergies  Family History  Problem Relation Age of Onset   Stroke Mother    Alzheimer's disease Mother    Heart disease Mother    Emphysema Father    Hypertension Brother    Heart attack Maternal Aunt    Heart failure Maternal Grandmother    Hypertension Paternal Grandmother     Prior to Admission medications   Medication Sig Start Date End Date Taking? Authorizing Provider  acetaminophen (TYLENOL) 500 MG tablet Take 1,000 mg by mouth every 6 (six) hours as needed for moderate pain.   Yes [provider]  ascorbic acid (VITAMIN C) 500 MG tablet Take 500 mg by mouth daily.   Yes [provider]  Cholecalciferol (VITAMIN D-3) 125 MCG (5000 UT) TABS Take 5,000 Units by mouth daily.   Yes [provider]  hydroxychloroquine (PLAQUENIL) 200 MG tablet Take 1 tablet (200 mg total) by mouth daily. 02/10/23  Yes Deveshwar, Janalyn Rouse, MD  omeprazole (PRILOSEC) 20 MG capsule Take 20 mg by mouth daily.   Yes [provider]  Tiotropium Bromide-Olodaterol (STIOLTO RESPIMAT) 2.5-2.5 MCG/ACT AERS Inhale  2 puffs into the lungs daily. Patient not taking: Reported on 03/08/2023 11/20/22   Noemi Chapel, NP    Physical Exam: Vitals:   03/09/23 0103 03/09/23 0145 03/09/23 0400 03/09/23 0428  BP:  134/68 (!) 124/56   Pulse:  94 100   Resp:  17 20   Temp: 98.2 F (36.8 C)   98 F (36.7 C)  TempSrc:      SpO2:  100% 99%   Weight:      Height:       Constitutional: NAD, calm, comfortable Respiratory: Diminished R base, pleurx catheter present R side. Cardiovascular: Regular rate and rhythm, no murmurs / rubs / gallops. 1+ BLE edema present 2+ pedal pulses. No carotid bruits.  Abdomen: no tenderness, no masses palpated. No hepatosplenomegaly. Bowel sounds positive.  Neurologic: CN 2-12 grossly intact. Sensation intact, DTR normal. Strength 5/5 in all 4.  Psychiatric: Normal judgment and insight. Alert  and oriented x 3. Normal mood.   Data Reviewed:    Labs on Admission: I have personally reviewed following labs and imaging studies  CBC: Recent Labs  Lab 03/08/23 2157  WBC 8.5  HGB 11.1*  HCT 36.7  MCV 97.6  PLT 332   Basic Metabolic Panel: Recent Labs  Lab 03/08/23 2157  NA 138  K 3.8  CL 101  CO2 27  GLUCOSE 108*  BUN 15  CREATININE 0.98  CALCIUM 9.7   GFR: Estimated Creatinine Clearance: 50.7 mL/min (by C-G formula based on SCr of 0.98 mg/dL). Liver Function Tests: No results for input(s): "AST", "ALT", "ALKPHOS", "BILITOT", "PROT", "ALBUMIN" in the last 168 hours. No results for input(s): "LIPASE", "AMYLASE" in the last 168 hours. No results for input(s): "AMMONIA" in the last 168 hours. Coagulation Profile: No results for input(s): "INR", "PROTIME" in the last 168 hours. Cardiac Enzymes: No results for input(s): "CKTOTAL", "CKMB", "CKMBINDEX", "TROPONINI" in the last 168 hours. BNP (last 3 results) No results for input(s): "PROBNP" in the last 8760 hours. HbA1C: No results for input(s): "HGBA1C" in the last 72 hours. CBG: No results for input(s): "GLUCAP" in the last 168 hours. Lipid Profile: No results for input(s): "CHOL", "HDL", "LDLCALC", "TRIG", "CHOLHDL", "LDLDIRECT" in the last 72 hours. Thyroid Function Tests: No results for input(s): "TSH", "T4TOTAL", "FREET4", "T3FREE", "THYROIDAB" in the last 72 hours. Anemia Panel: No results for input(s): "VITAMINB12", "FOLATE", "FERRITIN", "TIBC", "IRON", "RETICCTPCT" in the last 72 hours. Urine analysis:    Component Value Date/Time   COLORURINE STRAW (A) 10/30/2021 1520   APPEARANCEUR CLEAR 10/30/2021 1520   LABSPEC 1.008 10/30/2021 1520   PHURINE 5.0 10/30/2021 1520   GLUCOSEU NEGATIVE 10/30/2021 1520   HGBUR NEGATIVE 10/30/2021 1520   BILIRUBINUR NEGATIVE 10/30/2021 1520   KETONESUR NEGATIVE 10/30/2021 1520   PROTEINUR NEGATIVE 10/30/2021 1520   NITRITE NEGATIVE 10/30/2021 1520   LEUKOCYTESUR  SMALL (A) 10/30/2021 1520    Radiological Exams on Admission: CT Angio Chest PE W and/or Wo Contrast  Result Date: 03/09/2023 CLINICAL DATA:  67 year old female with history of acute onset of chest pain. Evaluate for pulmonary embolism. History of lung cancer. * Tracking Code: BO * EXAM: CT ANGIOGRAPHY CHEST WITH CONTRAST TECHNIQUE: Multidetector CT imaging of the chest was performed using the standard protocol during bolus administration of intravenous contrast. Multiplanar CT image reconstructions and MIPs were obtained to evaluate the vascular anatomy. RADIATION DOSE REDUCTION: This exam was performed according to the departmental dose-optimization program which includes automated exposure control, adjustment of the mA and/or kV according  to patient size and/or use of iterative reconstruction technique. CONTRAST:  75mL OMNIPAQUE IOHEXOL 350 MG/ML SOLN COMPARISON:  Chest CT 02/11/2023. FINDINGS: Cardiovascular: There are multiple filling defects in the left-sided pulmonary arterial tree involving segmental and subsegmental sized pulmonary artery branches to the left upper lobe, indicative of pulmonary embolism. These appear nonocclusive at this time. No definite right-sided filling defects are noted. Heart size is normal. There is no significant pericardial fluid, thickening or pericardial calcification. Atherosclerotic calcifications in the thoracic aorta as well as the left main, left anterior descending and right coronary arteries. Mediastinum/Nodes: Prominent nodal tissue is noted measuring up to 1.4 cm in short axis in the subcarinal region. Architectural distortion in the right hilar region noted. No definite discrete hilar lymphadenopathy confidently identified. Esophagus is unremarkable in appearance. No axillary lymphadenopathy. Lungs/Pleura: Mass-like architectural distortion in the perihilar aspect of the right lung, likely reflective of chronic postradiation mass-like fibrosis. Right-sided chest  tube in position with tip extending into the posterior aspect of the upper right hemithorax within a large right-sided pleural effusion which has some associated pleural enhancement. In the posterior aspect of the left upper lobe (axial image 53 of series 10) there is a 9 x 9 mm nodule adjacent to a fiducial marker, corresponding to the partially treated neoplasm. Areas of ground-glass attenuation and septal thickening are noted in the surrounding lung, likely to reflect evolving postradiation pneumonitis. No left pleural effusion. No other new suspicious appearing pulmonary nodules or masses are noted. Diffuse bronchial wall thickening with moderate centrilobular and mild paraseptal emphysema. Upper Abdomen: Aortic atherosclerosis. Musculoskeletal: There are no aggressive appearing lytic or blastic lesions noted in the visualized portions of the skeleton. Review of the MIP images confirms the above findings. IMPRESSION: 1. Study is positive for pulmonary embolism with multiple nonocclusive segmental and subsegmental sized filling defects to the left upper lobe, as above. 2. Right-sided chest tube stable in position with tip in the posterior aspect of the upper right hemithorax. Large chronic right pleural effusion with pleural enhancement noted. 3. Treated nodule in the posterior aspect of the left upper lobe again noted, with evolving postradiation changes in the surrounding lung parenchyma. 4. Diffuse bronchial wall thickening with moderate centrilobular and mild paraseptal emphysema; imaging findings suggestive of underlying COPD. 5. Aortic atherosclerosis, in addition to left main and 2 vessel coronary artery disease. Please note that although the presence of coronary artery calcium documents the presence of coronary artery disease, the severity of this disease and any potential stenosis cannot be assessed on this non-gated CT examination. Assessment for potential risk factor modification, dietary therapy or  pharmacologic therapy may be warranted, if clinically indicated. Critical Value/emergent results were discussed by telephone at the time of interpretation on 03/09/2023 at 5:44 am to provider Dr. Julian Reil, who verbally acknowledged these results. Aortic Atherosclerosis (ICD10-I70.0) and Emphysema (ICD10-J43.9). Electronically Signed   By: Trudie Reed M.D.   On: 03/09/2023 05:44   DG Chest Portable 1 View  Result Date: 03/08/2023 CLINICAL DATA:  Chest pain and shortness of breath EXAM: PORTABLE CHEST 1 VIEW COMPARISON:  Radiograph 01/22/2023 and CT 02/11/2023 FINDINGS: Right chest tube in the moderate-to-large partially loculated right pleural effusion which is grossly similar to CT chest 02/11/2023. Associated airspace and interstitial opacities the right mid and lower lung. Interstitial thickening in the left mid and lower lung. Small left pleural effusion. No pneumothorax. IMPRESSION: 1. Right chest tube in the moderate-to-large partially loculated right pleural effusion which is grossly similar to CT chest  02/11/2023. 2. Airspace and interstitial opacities in the bilateral mid and lower lungs are slightly increased compared to radiographs 01/22/2023 and are suspicious for edema or infection. Electronically Signed   By: Minerva Fester M.D.   On: 03/08/2023 23:39    EKG: Independently reviewed.   Assessment and Plan: * Acute pulmonary embolism without acute cor pulmonale (HCC) Pt with acute PE in LUL, non-occlusive, no RHS findings on CT at this time. Presumably this the cause of her trop elevation today. As well as mild tachycardia. Low dose heparin gtt per pharm Low dose at this time given pt has known brain mets Brain mets are obviously at risk of bleeding (though no history of such in this patient), but given high risk of recurrent PEs and death from recurrent PEs (in this pt with stage 4 adenocarcinoma of lung), I feel that benefits of trying to treat with [facility] at this time outweigh  risks. Sent secure chat to Dr. Myna Hidalgo (on call for Dr. Arbutus Ped), and added Dr. Arbutus Ped to chart so pt shows up on list. Tele monitor 2d echo US DVT  Adenocarcinoma of right lung, stage 4 (HCC) S/p chemo and radiation. Currently on observation therapy. Plurex catheter in place for chronic pleural effusion. Pleural drainage catheter orders placed for intermittent drainage during hospital stay.  Rheumatoid arthritis involving multiple sites with positive rheumatoid factor (HCC) Continue Plaquenil      Advance Care Planning:   Code Status: Full Code  Consults: EDP d/w Dr. Orson Aloe  Family Communication: No family in room at time of my visit.  Severity of Illness: The appropriate patient status for this patient is OBSERVATION. Observation status is judged to be reasonable and necessary in order to provide the required intensity of service to ensure the patient's safety. The patient's presenting symptoms, physical exam findings, and initial radiographic and laboratory data in the context of their medical condition is felt to place them at decreased risk for further clinical deterioration. Furthermore, it is anticipated that the patient will be medically stable for discharge from the hospital within 2 midnights of admission.   Author: Hillary Bow., DO 03/09/2023 5:32 AM  For on call review [email or web address].

## 2023-03-09 NOTE — Assessment & Plan Note (Addendum)
S/p chemo and radiation. Currently on observation therapy. Plurex catheter in place for chronic pleural effusion. Pleural drainage catheter orders placed for intermittent drainage during hospital stay.

## 2023-03-09 NOTE — ED Notes (Signed)
Pt's heparin level is >1.10. Per pharmacy, hold heparin gtt for an hour.

## 2023-03-09 NOTE — ED Notes (Signed)
Iv team here 

## 2023-03-09 NOTE — ED Notes (Signed)
The pt needed to use the br for a bm she became extremely sob with the exertion of getting on the bedside commode.  Her sats dropped into the 70s   getting back up after a few seconds while stationary  then there was another lowered sats  whenever she was placed back in the bed.  Sats going back up on 4 liters after a few seconds back to 98 on 4    iv team at the bedside

## 2023-03-09 NOTE — Progress Notes (Signed)
[facility] PROGRESS NOTE    Progress Note  Susan Holder  ZOX:[id number] DOB: 1955/10/12 DOA: 03/08/2023 PCP: [name], MD     Brief Narrative:   Susan Holder is an 67 y.o. female past medical history significant for non-small cell lung cancer with metastases to brain, pericardial effusion status post pericardial window and pleural effusion with a Pleurx catheter comes into the ED for new onset chest pain with vomiting, at home he is usually on 2 L but he had to bump it up to 4, recently saw cardiology on 02/15/2023 they noted his heart rate was in the 120s schedule 2D echo.  CT angio of the chest was positive for PE with multiple nonocclusive segmental left upper lobe.  Some diffuse bronchial thickening likely due to paraseptal emphysema.  Assessment/Plan:   Acute pulmonary embolism without acute cor pulmonale (HCC) Started on IV heparin.  Low-dose due to his brain mets. He is at risk of hemorrhagic bleed due to his brain mets. 2D echo pending Lower extremity Doppler pending. Patient does not want to talk about palliative care. Will inform Dr. Shirline Frees.  Adenocarcinoma of right lung, stage 4 (HCC) Status post chemo chemotherapy and radiation. Pleurx catheter for chronic pleural effusion. Catheter orders placed for intermittent drainage during hospital stay.  Rheumatoid arthritis involving multiple sites with positive rheumatoid factor (HCC) Continue Plaquenil.  Chronic respiratory failure with hypoxia (HCC) Normal 4 L due to shortness of breath.   DVT prophylaxis: heparin Family Communication:none Status is: Observation The patient will require care spanning > 2 midnights and should be moved to inpatient because: Acute respiratory failure with hypoxia due to acute pulmonary embolism.    Code Status:     Code Status Orders  (From admission, onward)           Start     Ordered   03/09/23 0548  Full code  Continuous       Question:  By:   Answer:  Default: patient does not have capacity for decision making, no surrogate or prior directive available   03/09/23 0548           Code Status History     Date Active Date Inactive Code Status Order ID Comments User Context   05/13/2021 1452 05/14/2021 1711 Full Code [id number]  Corliss Skains, MD Inpatient   02/07/2021 1849 02/10/2021 2020 Full Code [id number]  Leary Roca, PA-C Inpatient      Advance Directive Documentation    Flowsheet Row Most Recent Value  Type of Advance Directive Living will, Healthcare Power of Attorney  Pre-existing out of facility DNR order (yellow form or pink MOST form) --  "MOST" Form in Place? --         IV Access:   Peripheral IV   Procedures and diagnostic studies:   CT Angio Chest PE W and/or Wo Contrast  Result Date: 03/09/2023 CLINICAL DATA:  67 year old female with history of acute onset of chest pain. Evaluate for pulmonary embolism. History of lung cancer. * Tracking Code: BO * EXAM: CT ANGIOGRAPHY CHEST WITH CONTRAST TECHNIQUE: Multidetector CT imaging of the chest was performed using the standard protocol during bolus administration of intravenous contrast. Multiplanar CT image reconstructions and MIPs were obtained to evaluate the vascular anatomy. RADIATION DOSE REDUCTION: This exam was performed according to the departmental dose-optimization program which includes automated exposure control, adjustment of the mA and/or kV according to patient size and/or use of iterative reconstruction technique. CONTRAST:  75mL OMNIPAQUE  IOHEXOL 350 MG/ML SOLN COMPARISON:  Chest CT 02/11/2023. FINDINGS: Cardiovascular: There are multiple filling defects in the left-sided pulmonary arterial tree involving segmental and subsegmental sized pulmonary artery branches to the left upper lobe, indicative of pulmonary embolism. These appear nonocclusive at this time. No definite right-sided filling defects are noted. Heart size is normal.  There is no significant pericardial fluid, thickening or pericardial calcification. Atherosclerotic calcifications in the thoracic aorta as well as the left main, left anterior descending and right coronary arteries. Mediastinum/Nodes: Prominent nodal tissue is noted measuring up to 1.4 cm in short axis in the subcarinal region. Architectural distortion in the right hilar region noted. No definite discrete hilar lymphadenopathy confidently identified. Esophagus is unremarkable in appearance. No axillary lymphadenopathy. Lungs/Pleura: Mass-like architectural distortion in the perihilar aspect of the right lung, likely reflective of chronic postradiation mass-like fibrosis. Right-sided chest tube in position with tip extending into the posterior aspect of the upper right hemithorax within a large right-sided pleural effusion which has some associated pleural enhancement. In the posterior aspect of the left upper lobe (axial image 53 of series 10) there is a 9 x 9 mm nodule adjacent to a fiducial marker, corresponding to the partially treated neoplasm. Areas of ground-glass attenuation and septal thickening are noted in the surrounding lung, likely to reflect evolving postradiation pneumonitis. No left pleural effusion. No other new suspicious appearing pulmonary nodules or masses are noted. Diffuse bronchial wall thickening with moderate centrilobular and mild paraseptal emphysema. Upper Abdomen: Aortic atherosclerosis. Musculoskeletal: There are no aggressive appearing lytic or blastic lesions noted in the visualized portions of the skeleton. Review of the MIP images confirms the above findings. IMPRESSION: 1. Study is positive for pulmonary embolism with multiple nonocclusive segmental and subsegmental sized filling defects to the left upper lobe, as above. 2. Right-sided chest tube stable in position with tip in the posterior aspect of the upper right hemithorax. Large chronic right pleural effusion with pleural  enhancement noted. 3. Treated nodule in the posterior aspect of the left upper lobe again noted, with evolving postradiation changes in the surrounding lung parenchyma. 4. Diffuse bronchial wall thickening with moderate centrilobular and mild paraseptal emphysema; imaging findings suggestive of underlying COPD. 5. Aortic atherosclerosis, in addition to left main and 2 vessel coronary artery disease. Please note that although the presence of coronary artery calcium documents the presence of coronary artery disease, the severity of this disease and any potential stenosis cannot be assessed on this non-gated CT examination. Assessment for potential risk factor modification, dietary therapy or pharmacologic therapy may be warranted, if clinically indicated. Critical Value/emergent results were discussed by telephone at the time of interpretation on 03/09/2023 at 5:44 am to provider Dr. Julian Reil, who verbally acknowledged these results. Aortic Atherosclerosis (ICD10-I70.0) and Emphysema (ICD10-J43.9). Electronically Signed   By: Trudie Reed M.D.   On: 03/09/2023 05:44   DG Chest Portable 1 View  Result Date: 03/08/2023 CLINICAL DATA:  Chest pain and shortness of breath EXAM: PORTABLE CHEST 1 VIEW COMPARISON:  Radiograph 01/22/2023 and CT 02/11/2023 FINDINGS: Right chest tube in the moderate-to-large partially loculated right pleural effusion which is grossly similar to CT chest 02/11/2023. Associated airspace and interstitial opacities the right mid and lower lung. Interstitial thickening in the left mid and lower lung. Small left pleural effusion. No pneumothorax. IMPRESSION: 1. Right chest tube in the moderate-to-large partially loculated right pleural effusion which is grossly similar to CT chest 02/11/2023. 2. Airspace and interstitial opacities in the bilateral mid and lower lungs  are slightly increased compared to radiographs 01/22/2023 and are suspicious for edema or infection. Electronically Signed   By:  Minerva Fester M.D.   On: 03/08/2023 23:39     Medical Consultants:   None.   Subjective:    Susan Holder she relates her breathing is about the same.  Objective:    Vitals:   03/09/23 0400 03/09/23 0428 03/09/23 0550 03/09/23 0600  BP: (!) 124/56  122/70 120/69  Pulse: 100  96 95  Resp: 20  20 18   Temp:  98 F (36.7 C) (!) 97 F (36.1 C)   TempSrc:   Oral   SpO2: 99%  100% 100%  Weight:      Height:       SpO2: 100 % O2 Flow Rate (L/min): 4 L/min  No intake or output data in the 24 hours ending 03/09/23 0652 Filed Weights   03/08/23 2142  Weight: 68.5 kg    Exam: General exam: In no acute distress. Respiratory system: Good air movement and clear to auscultation. Cardiovascular system: S1 & S2 heard, RRR. No JVD. Gastrointestinal system: Abdomen is nondistended, soft and nontender.  Extremities: No pedal edema. Skin: No rashes, lesions or ulcers Psychiatry: Judgement and insight appear normal. Mood & affect appropriate.    Data Reviewed:    Labs: Basic Metabolic Panel: Recent Labs  Lab 03/08/23 2157  NA 138  K 3.8  CL 101  CO2 27  GLUCOSE 108*  Holder 15  CREATININE 0.98  CALCIUM 9.7   GFR Estimated Creatinine Clearance: 50.7 mL/min (by C-G formula based on SCr of 0.98 mg/dL). Liver Function Tests: No results for input(s): "AST", "ALT", "ALKPHOS", "BILITOT", "PROT", "ALBUMIN" in the last 168 hours. No results for input(s): "LIPASE", "AMYLASE" in the last 168 hours. No results for input(s): "AMMONIA" in the last 168 hours. Coagulation profile No results for input(s): "INR", "PROTIME" in the last 168 hours. COVID-19 Labs  No results for input(s): "DDIMER", "FERRITIN", "LDH", "CRP" in the last 72 hours.  Lab Results  Component Value Date   SARSCOV2NAA NEGATIVE 03/11/2022   SARSCOV2NAA NEGATIVE 10/30/2021   SARSCOV2NAA NEGATIVE 05/13/2021   SARSCOV2NAA NEGATIVE 02/05/2021    CBC: Recent Labs  Lab 03/08/23 2157  WBC 8.5  HGB  11.1*  HCT 36.7  MCV 97.6  PLT 332   Cardiac Enzymes: No results for input(s): "CKTOTAL", "CKMB", "CKMBINDEX", "TROPONINI" in the last 168 hours. BNP (last 3 results) No results for input(s): "PROBNP" in the last 8760 hours. CBG: No results for input(s): "GLUCAP" in the last 168 hours. D-Dimer: No results for input(s): "DDIMER" in the last 72 hours. Hgb A1c: No results for input(s): "HGBA1C" in the last 72 hours. Lipid Profile: No results for input(s): "CHOL", "HDL", "LDLCALC", "TRIG", "CHOLHDL", "LDLDIRECT" in the last 72 hours. Thyroid function studies: No results for input(s): "TSH", "T4TOTAL", "T3FREE", "THYROIDAB" in the last 72 hours.  Invalid input(s): "FREET3" Anemia work up: No results for input(s): "VITAMINB12", "FOLATE", "FERRITIN", "TIBC", "IRON", "RETICCTPCT" in the last 72 hours. Sepsis Labs: Recent Labs  Lab 03/08/23 2157  WBC 8.5   Microbiology No results found for this or any previous visit (from the past 240 hour(s)).   Medications:    hydroxychloroquine  200 mg Oral Daily   pantoprazole  40 mg Oral Daily   Continuous Infusions:  heparin 1,100 Units/hr (03/09/23 0606)      LOS: 0 days   Marinda Elk  [facility]  03/09/2023, 6:52 AM

## 2023-03-09 NOTE — Progress Notes (Signed)
PHARMACY - ANTICOAGULATION CONSULT NOTE  Pharmacy Consult for IV heparin Indication: pulmonary embolus  No Known Allergies  Patient Measurements: Height: 5\' 6"  (167.6 cm) Weight: 68.5 kg (151 lb) IBW/kg (Calculated) : 59.3 Heparin Dosing Weight: 68.5 kg  Vital Signs: Temp: 98 F (36.7 C) (11/11 0428) Temp Source: Oral (11/10 2200) BP: 124/56 (11/11 0400) Pulse Rate: 100 (11/11 0400)  Labs: Recent Labs    03/08/23 2157 03/08/23 2355  HGB 11.1*  --   HCT 36.7  --   PLT 332  --   CREATININE 0.98  --   TROPONINIHS 381* 380*    Estimated Creatinine Clearance: 50.7 mL/min (by C-G formula based on SCr of 0.98 mg/dL).   Medical History: Past Medical History:  Diagnosis Date   Acid reflux 09/21/2018   Adenocarcinoma of right lung, stage 4 (HCC) 11/17/2017   ADHD (attention deficit hyperactivity disorder)    patient denies this dx as of 03/10/22   Allergic rhinitis 08/22/2013   ANA positive 05/29/2016   Anemia    Aortic atherosclerosis (HCC) 10/23/2020   Brain metastases 03/29/2019   COPD (chronic obstructive pulmonary disease) (HCC)    Coronary artery calcification seen on CT scan 10/23/2020   patient is unaware of this dx as of 03/10/22   DDD (degenerative disc disease), cervical 05/29/2016   patient denies this dx as of 03/10/22   Dyspnea    oxygen 2L via [place]   Encounter for antineoplastic chemotherapy 11/17/2017   Encounter for antineoplastic immunotherapy 02/01/2018   Goals of care, counseling/discussion 11/17/2017   High risk medication use 05/29/2016   04/29/2016: ==> plq 200 am & 100 qhs(adeq response).   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 year old female admitted on 03/08/2023 with concern for PE. Of note, PMH significant for NSCLC with brain mets present. No anticoagulation prior to admission. Pharmacy consulted to dose heparin.  Goal of Therapy:  Heparin level 0.3 - 0.5 units/ml Monitor platelets by anticoagulation protocol: Yes   Plan:  Heparin 2000 units x 1 as bolus followed by heparin infusion at 1100 units/hr 8 heparin level  Daily heparin level, CBC, and monitoring for bleeding F/u plans for anticoagulation   Thank you for allowing pharmacy to participate in this patient's care.  Marja Kays, PharmD Emergency Medicine Clinical Pharmacist 03/09/2023,5:53 AM

## 2023-03-09 NOTE — ED Notes (Signed)
ED TO INPATIENT HANDOFF REPORT  ED Nurse Name and Phone #: Cammy Copa  S Name/Age/Gender Susan Holder 67 y.o. female Room/Bed: 038C/038C  Code Status   Code Status: Full Code  Home/SNF/Other Home Patient oriented to: self, place, time, and situation Is this baseline? Yes   Triage Complete: Triage complete  Chief Complaint Acute pulmonary embolism without acute cor pulmonale (HCC) [I26.99]  Triage Note PT here [facility] from home for acute onset chest pain.  Pt vomited and pain improved.  Stated same type of episode last week.  Pt states she feels fine now.    Pt is on 4L [place] (recently increased her O2 herself from 2L).  Was briefly placed on 8L in ambulance d/t increased rr with transferring from chair to stretcher.   Recently seen cardiology np and scheduled for echo.  Awaiting visit with oncologist (lung ca) for recent increase in O2 needs.    Allergies No Known Allergies  Level of Care/Admitting Diagnosis ED Disposition     ED Disposition  Admit   Condition  --   Comment  Hospital Area: [facility] [[id number]]  Level of Care: Telemetry Cardiac [[id number]]  May admit patient to [facility] or [facility] if equivalent level of care is available:: No  Covid Evaluation: Asymptomatic - no recent exposure (last 10 days) testing not required  Diagnosis: Acute pulmonary embolism without acute cor pulmonale [facility]) [[id number]]  Admitting Physician: Marinda Elk [[id number]]  Attending Physician: Marinda Elk [[id number]]  Certification:: I certify this patient will need inpatient services for at least 2 midnights  Expected Medical Readiness: 03/11/2023          B Medical/Surgery History Past Medical History:  Diagnosis Date   Acid reflux 09/21/2018   Adenocarcinoma of right lung, stage 4 (HCC) 11/17/2017   ADHD (attention deficit hyperactivity disorder)    patient denies this dx as of 03/10/22   Allergic rhinitis 08/22/2013   ANA positive  05/29/2016   Anemia    Aortic atherosclerosis (HCC) 10/23/2020   Brain metastases 03/29/2019   COPD (chronic obstructive pulmonary disease) (HCC)    Coronary artery calcification seen on CT scan 10/23/2020   patient is unaware of this dx as of 03/10/22   DDD (degenerative disc disease), cervical 05/29/2016   patient denies this dx as of 03/10/22   Dyspnea    oxygen 2L via [place]   Encounter for antineoplastic chemotherapy 11/17/2017   Encounter for antineoplastic immunotherapy 02/01/2018   Goals of care, counseling/discussion 11/17/2017   High risk medication use 05/29/2016   04/29/2016: ==> plq 200 am & 100 qhs(adeq response).   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   Other fatigue 05/29/2016   Oxygen dependent    2L via [place]   Pericardial effusion 10/23/2020   Pneumonia    as a child   Primary malignant neoplasm of bronchus of right lower lobe (HCC) 12/21/2017   Rheumatoid arthritis (HCC)    Rheumatoid arthritis involving multiple sites with positive rheumatoid factor (HCC) 05/29/2016   +RF +ANA +CCP    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   Smoker 05/29/2016   quit 2019   Trigger finger, left ring finger 05/29/2016   Trigger  finger, right ring finger 05/29/2016   Vitamin D deficiency 05/29/2016   Past Surgical History:  Procedure Laterality Date   BRONCHIAL BIOPSY  03/11/2022   Procedure: BRONCHIAL BIOPSIES;  Surgeon: Omar Person, MD;  Location: [facility] ENDOSCOPY;  Service: Pulmonary;;   BRONCHIAL BIOPSY  11/07/2022   Procedure: BRONCHIAL BIOPSIES;  Surgeon: Omar Person, MD;  Location: [facility] ENDOSCOPY;  Service: Pulmonary;;   BRONCHIAL BRUSHINGS  11/07/2022   Procedure: BRONCHIAL BRUSHINGS;  Surgeon: Omar Person, MD;  Location: [facility]  ENDOSCOPY;  Service: Pulmonary;;   BRONCHIAL NEEDLE ASPIRATION BIOPSY  11/09/2017   Procedure: BRONCHIAL NEEDLE ASPIRATION BIOPSIES;  Surgeon: Chilton Greathouse, MD;  Location: [facility] ENDOSCOPY;  Service: Cardiopulmonary;;   BRONCHIAL NEEDLE ASPIRATION BIOPSY  11/07/2022   Procedure: BRONCHIAL NEEDLE ASPIRATION BIOPSIES;  Surgeon: Omar Person, MD;  Location: [facility] ENDOSCOPY;  Service: Pulmonary;;   BRONCHIAL WASHINGS  03/11/2022   Procedure: BRONCHIAL WASHINGS;  Surgeon: Omar Person, MD;  Location: [facility] ENDOSCOPY;  Service: Pulmonary;;   CHEST TUBE INSERTION Left 05/13/2021   Procedure: INSERTION PLEURAL DRAINAGE CATHETER;  Surgeon: Corliss Skains, MD;  Location: [facility] OR;  Service: Thoracic;  Laterality: Left;   CHEST TUBE INSERTION Right 03/11/2022   Procedure: INSERTION PLEURAL DRAINAGE CATHETER;  Surgeon: Omar Person, MD;  Location: [facility] ENDOSCOPY;  Service: Pulmonary;  Laterality: Right;  indwelling pleural catheter, w/ cuff   ENDOBRONCHIAL ULTRASOUND Bilateral 11/09/2017   Procedure: ENDOBRONCHIAL ULTRASOUND;  Surgeon: Chilton Greathouse, MD;  Location: [facility] ENDOSCOPY;  Service: Cardiopulmonary;  Laterality: Bilateral;   FIDUCIAL MARKER PLACEMENT  11/07/2022   Procedure: FIDUCIAL MARKER PLACEMENT;  Surgeon: Omar Person, MD;  Location: [facility] ENDOSCOPY;  Service: Pulmonary;;   NASAL SINUS SURGERY     NASAL SINUS SURGERY     THORACENTESIS Right 02/07/2021   Procedure: THORACENTESIS;  Surgeon: Corliss Skains, MD;  Location: [facility] OR;  Service: Thoracic;  Laterality: Right;   TUBAL LIGATION     VIDEO BRONCHOSCOPY  11/09/2017   Procedure: VIDEO BRONCHOSCOPY;  Surgeon: Chilton Greathouse, MD;  Location: [facility] ENDOSCOPY;  Service: Cardiopulmonary;;   VIDEO BRONCHOSCOPY Left 03/11/2022   Procedure: VIDEO BRONCHOSCOPY WITH FLUORO;  Surgeon: Omar Person, MD;  Location: [facility] ENDOSCOPY;  Service: Pulmonary;  Laterality: Left;   VIDEO BRONCHOSCOPY WITH RADIAL ENDOBRONCHIAL ULTRASOUND  11/07/2022    Procedure: VIDEO BRONCHOSCOPY WITH RADIAL ENDOBRONCHIAL ULTRASOUND;  Surgeon: Omar Person, MD;  Location: [facility] ENDOSCOPY;  Service: Pulmonary;;   XI ROBOTIC ASSISTED PERICARDIAL WINDOW Left 02/07/2021   Procedure: XI ROBOTIC ASSISTED THORACOSCOPY PERICARDIAL WINDOW;  Surgeon: Corliss Skains, MD;  Location: [facility] OR;  Service: Thoracic;  Laterality: Left;     A IV Location/Drains/Wounds Patient Lines/Drains/Airways Status     Active Line/Drains/Airways     Name Placement date Placement time Site Days   Peripheral IV 03/08/23 22 G Left;Posterior Hand 03/08/23  --  Hand  1   Peripheral IV 03/09/23 20 G 1.88" Left;Upper Arm 03/09/23  0457  Arm  less than 1   Chest Tube 1 Lateral;Right Pleural 15.5 Fr. 03/11/22  0955  Pleural  363            Intake/Output Last 24 hours  Intake/Output Summary (Last 24 hours) at 03/09/2023 1506 Last data filed at 03/09/2023 1503 Gross per 24 hour  Intake 0.93 ml  Output --  Net 0.93 ml    Labs/Imaging Results for orders placed or performed during the hospital encounter of 03/08/23 (from the past 48 hour(s))  Basic  metabolic panel     Status: Abnormal   Collection Time: 03/08/23  9:57 PM  Result Value Ref Range   Sodium 138 135 - 145 mmol/L   Potassium 3.8 3.5 - 5.1 mmol/L   Chloride 101 98 - 111 mmol/L   CO2 27 22 - 32 mmol/L   Glucose, Bld 108 (H) 70 - 99 mg/dL    Comment: Glucose reference range applies only to samples taken after fasting for at least 8 hours.   BUN 15 8 - 23 mg/dL   Creatinine, Ser 1.61 0.44 - 1.00 mg/dL   Calcium 9.7 8.9 - 09.6 mg/dL   GFR, Estimated >04 >54 mL/min    Comment: (NOTE) Calculated using the CKD-EPI Creatinine Equation (2021)    Anion gap 10 5 - 15    Comment: Performed at [facility], [address], [place], [place] [address]  CBC     Status: Abnormal   Collection Time: 03/08/23  9:57 PM  Result Value Ref Range   WBC 8.5 4.0 - 10.5 K/uL   RBC 3.76 (L) 3.87 - 5.11 MIL/uL    Hemoglobin 11.1 (L) 12.0 - 15.0 g/dL   HCT 91.4 78.2 - 95.6 %   MCV 97.6 80.0 - 100.0 fL   MCH 29.5 26.0 - 34.0 pg   MCHC 30.2 30.0 - 36.0 g/dL   RDW 21.3 08.6 - 57.8 %   Platelets 332 150 - 400 K/uL   nRBC 0.0 0.0 - 0.2 %    Comment: Performed at [facility], [address] [place], [place] [address]  Troponin I (High Sensitivity)     Status: Abnormal   Collection Time: 03/08/23  9:57 PM  Result Value Ref Range   Troponin I (High Sensitivity) 381 (HH) <18 ng/L    Comment: CRITICAL RESULT CALLED TO, READ BACK BY AND VERIFIED WITH CHRISCO, C. RN @ 2245 03/08/23 JBUTLER (NOTE) Elevated high sensitivity troponin I (hsTnI) values and significant  changes across serial measurements may suggest ACS but many other  chronic and acute conditions are known to elevate hsTnI results.  Refer to the "Links" section for chest pain algorithms and additional  guidance. Performed at [facility], [address], [place], [place] [address]   Troponin I (High Sensitivity)     Status: Abnormal   Collection Time: 03/08/23 11:55 PM  Result Value Ref Range   Troponin I (High Sensitivity) 380 (HH) <18 ng/L    Comment: CRITICAL VALUE NOTED. VALUE IS CONSISTENT WITH PREVIOUSLY REPORTED/CALLED VALUE (NOTE) Elevated high sensitivity troponin I (hsTnI) values and significant  changes across serial measurements may suggest ACS but many other  chronic and acute conditions are known to elevate hsTnI results.  Refer to the "Links" section for chest pain algorithms and additional  guidance. Performed at [facility], [address], [place], [place] [address]   HIV Antibody (routine testing w rflx)     Status: None   Collection Time: 03/09/23  8:29 AM  Result Value Ref Range   HIV Screen 4th Generation wRfx Non Reactive Non Reactive    Comment: Performed at [facility], [address], [place], [place] [address]  Troponin I (High Sensitivity)     Status: Abnormal   Collection Time:  03/09/23  8:29 AM  Result Value Ref Range   Troponin I (High Sensitivity) 427 (HH) <18 ng/L    Comment: CRITICAL VALUE NOTED. VALUE IS CONSISTENT WITH PREVIOUSLY REPORTED/CALLED VALUE (NOTE) Elevated  high sensitivity troponin I (hsTnI) values and significant  changes across serial measurements may suggest ACS but many other  chronic and acute conditions are known to elevate hsTnI results.  Refer to the "Links" section for chest pain algorithms and additional  guidance. Performed at [facility], [address] [place], [place] [address]    VAS [place] LOWER EXTREMITY VENOUS (DVT)  Result Date: 03/09/2023  Lower Venous DVT Study Patient Name:  SAPHIRE BUFF  Date of Exam:   03/09/2023 Medical Rec #: [id number]          Accession #:    [id number] Date of Birth: Aug 12, 1955           Patient Gender: F Patient Age:   43 years Exam Location:  [facility] Procedure:      VAS [place] LOWER EXTREMITY VENOUS (DVT) Referring Phys: Lyda Perone --------------------------------------------------------------------------------  Indications: Pulmonary embolism, SOB, and chest pain.  Risk Factors: Confirmed PE. Comparison Study: No prior study. Performing Technologist: Fernande Bras  Examination Guidelines: A complete evaluation includes B-mode imaging, spectral Doppler, color Doppler, and power Doppler as needed of all accessible portions of each vessel. Bilateral testing is considered an integral part of a complete examination. Limited examinations for reoccurring indications may be performed as noted. The reflux portion of the exam is performed with the patient in reverse Trendelenburg.  +---------+---------------+---------+-----------+----------+--------------+ RIGHT    CompressibilityPhasicitySpontaneityPropertiesThrombus Aging +---------+---------------+---------+-----------+----------+--------------+ CFV      Full           Yes      Yes                                  +---------+---------------+---------+-----------+----------+--------------+ SFJ      Full                                                        +---------+---------------+---------+-----------+----------+--------------+ FV Prox  Full                                                        +---------+---------------+---------+-----------+----------+--------------+ FV Mid   Full                                                        +---------+---------------+---------+-----------+----------+--------------+ FV DistalFull                                                        +---------+---------------+---------+-----------+----------+--------------+ PFV      Full                                                        +---------+---------------+---------+-----------+----------+--------------+  POP      Full           Yes      Yes                                 +---------+---------------+---------+-----------+----------+--------------+ PTV      Full                                                        +---------+---------------+---------+-----------+----------+--------------+ PERO     None           No       No                                  +---------+---------------+---------+-----------+----------+--------------+   +---------+---------------+---------+-----------+----------+--------------+ LEFT     CompressibilityPhasicitySpontaneityPropertiesThrombus Aging +---------+---------------+---------+-----------+----------+--------------+ CFV      Full           Yes      Yes                                 +---------+---------------+---------+-----------+----------+--------------+ SFJ      Full                                                        +---------+---------------+---------+-----------+----------+--------------+ FV Prox  Full                                                         +---------+---------------+---------+-----------+----------+--------------+ FV Mid   Full                                                        +---------+---------------+---------+-----------+----------+--------------+ FV DistalFull                                                        +---------+---------------+---------+-----------+----------+--------------+ PFV      Full                                                        +---------+---------------+---------+-----------+----------+--------------+ POP      Full           Yes      Yes                                 +---------+---------------+---------+-----------+----------+--------------+  PTV      Partial        No       No                                  +---------+---------------+---------+-----------+----------+--------------+ PERO     Full                                                        +---------+---------------+---------+-----------+----------+--------------+     Summary: BILATERAL: -No evidence of popliteal cyst, bilaterally. RIGHT: - Findings consistent with acute deep vein thrombosis involving the right peroneal veins.   LEFT: - Findings consistent with acute deep vein thrombosis involving the left posterior tibial veins.   *See table(s) above for measurements and observations.    Preliminary    ECHOCARDIOGRAM COMPLETE  Result Date: 03/09/2023    ECHOCARDIOGRAM REPORT   Patient Name:   NARCISSUS MACDERMOTT Date of Exam: 03/09/2023 Medical Rec #:  [id number]         Height:       66.0 in Accession #:    [id number]        Weight:       151.0 lb Date of Birth:  01-06-1956          BSA:          1.775 m Patient Age:    67 years          BP:           120/69 mmHg Patient Gender: F                 HR:           97 bpm. Exam Location:  Inpatient Procedure: 2D Echo, Cardiac Doppler and Color Doppler Indications:    Pulmonary Embolus I26.09  History:        Patient has prior history of Echocardiogram  examinations, most                 recent 11/09/2020. COPD.  Sonographer:    Harriette Bouillon RDCS Referring Phys: [phone] JARED M GARDNER IMPRESSIONS  1. Left ventricular ejection fraction, by estimation, is 45 to 50%. The left ventricle has mildly decreased function. The left ventricle demonstrates global hypokinesis appearing worse at the apex. There is mild concentric left ventricular hypertrophy. Left ventricular diastolic parameters are consistent with Grade I diastolic dysfunction (impaired relaxation).  2. D-shaped septum suggestive of RV pressure/volume overload. Right ventricular systolic function is mildly reduced. The right ventricular size is mildly enlarged. There is mildly elevated pulmonary artery systolic pressure. The estimated right ventricular systolic pressure is 41.9 mmHg.  3. The mitral valve is normal in structure. Trivial mitral valve regurgitation. No evidence of mitral stenosis.  4. The tricuspid valve is abnormal. Tricuspid valve regurgitation is moderate.  5. The aortic valve is tricuspid. Aortic valve regurgitation is moderate. No aortic stenosis is present.  6. The inferior vena cava is normal in size with greater than 50% respiratory variability, suggesting right atrial pressure of 3 mmHg. FINDINGS  Left Ventricle: Left ventricular ejection fraction, by estimation, is 45 to 50%. The left ventricle has mildly decreased function. The left ventricle demonstrates global hypokinesis. The left ventricular internal cavity size was normal in size.  There is  mild concentric left ventricular hypertrophy. Left ventricular diastolic parameters are consistent with Grade I diastolic dysfunction (impaired relaxation). Right Ventricle: D-shaped septum suggestive of RV pressure/volume overload. The right ventricular size is mildly enlarged. No increase in right ventricular wall thickness. Right ventricular systolic function is mildly reduced. There is mildly elevated pulmonary artery systolic pressure. The  tricuspid regurgitant velocity is 3.12 m/s, and with an assumed right atrial pressure of 3 mmHg, the estimated right ventricular systolic pressure is 41.9 mmHg. Left Atrium: Left atrial size was normal in size. Right Atrium: Right atrial size was normal in size. Pericardium: There is no evidence of pericardial effusion. Mitral Valve: The mitral valve is normal in structure. Trivial mitral valve regurgitation. No evidence of mitral valve stenosis. Tricuspid Valve: The tricuspid valve is abnormal. Tricuspid valve regurgitation is moderate. Aortic Valve: The aortic valve is tricuspid. Aortic valve regurgitation is moderate. Aortic regurgitation PHT measures 800 msec. No aortic stenosis is present. Pulmonic Valve: The pulmonic valve was normal in structure. Pulmonic valve regurgitation is not visualized. Aorta: The aortic root is normal in size and structure. Venous: The inferior vena cava is normal in size with greater than 50% respiratory variability, suggesting right atrial pressure of 3 mmHg. IAS/Shunts: No atrial level shunt detected by color flow Doppler.  LEFT VENTRICLE PLAX 2D LVIDd:         3.90 cm   Diastology LVIDs:         2.90 cm   LV e' medial:    9.14 cm/s LV PW:         1.00 cm   LV E/e' medial:  6.8 LV IVS:        1.10 cm   LV e' lateral:   15.60 cm/s LVOT diam:     1.90 cm   LV E/e' lateral: 4.0 LV SV:         31 LV SV Index:   17 LVOT Area:     2.84 cm  RIGHT VENTRICLE            IVC RV S prime:     9.57 cm/s  IVC diam: 1.30 cm TAPSE (M-mode): 1.1 cm LEFT ATRIUM             Index       RIGHT ATRIUM           Index LA diam:        2.50 cm 1.41 cm/m  RA Area:     10.50 cm LA Vol (A2C):   9.4 ml  5.30 ml/m  RA Volume:   21.70 ml  12.23 ml/m LA Vol (A4C):   10.5 ml 5.92 ml/m LA Biplane Vol: 9.9 ml  5.60 ml/m  AORTIC VALVE LVOT Vmax:   65.50 cm/s LVOT Vmean:  44.600 cm/s LVOT VTI:    0.108 m AI PHT:      800 msec  AORTA Ao Root diam: 2.80 cm Ao Asc diam:  2.80 cm MITRAL VALVE               TRICUSPID  VALVE MV Area (PHT): 3.83 cm    TR Peak grad:   38.9 mmHg MV Decel Time: 198 msec    TR Vmax:        312.00 cm/s MV E velocity: 62.40 cm/s MV A velocity: 73.40 cm/s  SHUNTS MV E/A ratio:  0.85        Systemic VTI:  0.11 m  Systemic Diam: 1.90 cm Dalton McleanMD Electronically signed by Wilfred Lacy Signature Date/Time: 03/09/2023/9:25:05 AM    Final    CT Angio Chest PE W and/or Wo Contrast  Result Date: 03/09/2023 CLINICAL DATA:  68 year old female with history of acute onset of chest pain. Evaluate for pulmonary embolism. History of lung cancer. * Tracking Code: BO * EXAM: CT ANGIOGRAPHY CHEST WITH CONTRAST TECHNIQUE: Multidetector CT imaging of the chest was performed using the standard protocol during bolus administration of intravenous contrast. Multiplanar CT image reconstructions and MIPs were obtained to evaluate the vascular anatomy. RADIATION DOSE REDUCTION: This exam was performed according to the departmental dose-optimization program which includes automated exposure control, adjustment of the mA and/or kV according to patient size and/or use of iterative reconstruction technique. CONTRAST:  75mL OMNIPAQUE IOHEXOL 350 MG/ML SOLN COMPARISON:  Chest CT 02/11/2023. FINDINGS: Cardiovascular: There are multiple filling defects in the left-sided pulmonary arterial tree involving segmental and subsegmental sized pulmonary artery branches to the left upper lobe, indicative of pulmonary embolism. These appear nonocclusive at this time. No definite right-sided filling defects are noted. Heart size is normal. There is no significant pericardial fluid, thickening or pericardial calcification. Atherosclerotic calcifications in the thoracic aorta as well as the left main, left anterior descending and right coronary arteries. Mediastinum/Nodes: Prominent nodal tissue is noted measuring up to 1.4 cm in short axis in the subcarinal region. Architectural distortion in the right hilar  region noted. No definite discrete hilar lymphadenopathy confidently identified. Esophagus is unremarkable in appearance. No axillary lymphadenopathy. Lungs/Pleura: Mass-like architectural distortion in the perihilar aspect of the right lung, likely reflective of chronic postradiation mass-like fibrosis. Right-sided chest tube in position with tip extending into the posterior aspect of the upper right hemithorax within a large right-sided pleural effusion which has some associated pleural enhancement. In the posterior aspect of the left upper lobe (axial image 53 of series 10) there is a 9 x 9 mm nodule adjacent to a fiducial marker, corresponding to the partially treated neoplasm. Areas of ground-glass attenuation and septal thickening are noted in the surrounding lung, likely to reflect evolving postradiation pneumonitis. No left pleural effusion. No other new suspicious appearing pulmonary nodules or masses are noted. Diffuse bronchial wall thickening with moderate centrilobular and mild paraseptal emphysema. Upper Abdomen: Aortic atherosclerosis. Musculoskeletal: There are no aggressive appearing lytic or blastic lesions noted in the visualized portions of the skeleton. Review of the MIP images confirms the above findings. IMPRESSION: 1. Study is positive for pulmonary embolism with multiple nonocclusive segmental and subsegmental sized filling defects to the left upper lobe, as above. 2. Right-sided chest tube stable in position with tip in the posterior aspect of the upper right hemithorax. Large chronic right pleural effusion with pleural enhancement noted. 3. Treated nodule in the posterior aspect of the left upper lobe again noted, with evolving postradiation changes in the surrounding lung parenchyma. 4. Diffuse bronchial wall thickening with moderate centrilobular and mild paraseptal emphysema; imaging findings suggestive of underlying COPD. 5. Aortic atherosclerosis, in addition to left main and 2 vessel  coronary artery disease. Please note that although the presence of coronary artery calcium documents the presence of coronary artery disease, the severity of this disease and any potential stenosis cannot be assessed on this non-gated CT examination. Assessment for potential risk factor modification, dietary therapy or pharmacologic therapy may be warranted, if clinically indicated. Critical Value/emergent results were discussed by telephone at the time of interpretation on 03/09/2023 at 5:44 am to provider Dr. Julian Reil,  who verbally acknowledged these results. Aortic Atherosclerosis (ICD10-I70.0) and Emphysema (ICD10-J43.9). Electronically Signed   By: Trudie Reed M.D.   On: 03/09/2023 05:44   DG Chest Portable 1 View  Result Date: 03/08/2023 CLINICAL DATA:  Chest pain and shortness of breath EXAM: PORTABLE CHEST 1 VIEW COMPARISON:  Radiograph 01/22/2023 and CT 02/11/2023 FINDINGS: Right chest tube in the moderate-to-large partially loculated right pleural effusion which is grossly similar to CT chest 02/11/2023. Associated airspace and interstitial opacities the right mid and lower lung. Interstitial thickening in the left mid and lower lung. Small left pleural effusion. No pneumothorax. IMPRESSION: 1. Right chest tube in the moderate-to-large partially loculated right pleural effusion which is grossly similar to CT chest 02/11/2023. 2. Airspace and interstitial opacities in the bilateral mid and lower lungs are slightly increased compared to radiographs 01/22/2023 and are suspicious for edema or infection. Electronically Signed   By: Minerva Fester M.D.   On: 03/08/2023 23:39    Pending Labs Unresulted Labs (From admission, onward)     Start     Ordered   03/10/23 0500  CBC  Tomorrow morning,   R        03/09/23 0548   03/10/23 0500  Basic metabolic panel  Tomorrow morning,   R        03/09/23 0548   03/09/23 1400  Heparin level (unfractionated)  Once-Timed,   TIMED        03/09/23 0556             Vitals/Pain Today's Vitals   03/09/23 0934 03/09/23 1000 03/09/23 1015 03/09/23 1437  BP:  122/66 112/62 138/71  Pulse:  (!) 102 (!) 102 (!) 106  Resp:  17 (!) 25 (!) 21  Temp: (!) 97.1 F (36.2 C)   (!) 97.1 F (36.2 C)  TempSrc: Oral   Oral  SpO2:  100% 96% 100%  Weight:      Height:      PainSc:        Isolation Precautions No active isolations  Medications Medications  hydroxychloroquine (PLAQUENIL) tablet 200 mg (200 mg Oral Given 03/09/23 0930)  pantoprazole (PROTONIX) EC tablet 40 mg (40 mg Oral Given 03/09/23 0930)  acetaminophen (TYLENOL) tablet 1,000 mg (has no administration in time range)  ondansetron (ZOFRAN) tablet 4 mg (has no administration in time range)    Or  ondansetron (ZOFRAN) injection 4 mg (has no administration in time range)  heparin ADULT infusion 100 units/mL (25000 units/274mL) (1,100 Units/hr Intravenous New Bag/Given 03/09/23 0606)  aspirin chewable tablet 324 mg (324 mg Oral Given 03/09/23 0054)  iohexol (OMNIPAQUE) 350 MG/ML injection 75 mL (75 mLs Intravenous Contrast Given 03/09/23 0530)  heparin bolus via infusion 2,000 Units (2,000 Units Intravenous Bolus from Bag 03/09/23 0607)    Mobility walks with person assist     Focused Assessments Pulmonary Assessment Handoff:  Lung sounds:   O2 Device: Room Air O2 Flow Rate (L/min): 4 L/min    R Recommendations: See Admitting Provider Note  Report given to:   Additional Notes: 4L [place]

## 2023-03-09 NOTE — Progress Notes (Signed)
Bilateral lower extremity venous duplex completed. Results in Epic and relayed to RN Vernona Rieger.   [facility], RVT.

## 2023-03-09 NOTE — Assessment & Plan Note (Signed)
-   Continue Plaquenil 

## 2023-03-09 NOTE — ED Notes (Signed)
The pt is waiting to get an iv from the iv team so she can be scanned

## 2023-03-10 DIAGNOSIS — C3491 Malignant neoplasm of unspecified part of right bronchus or lung: Secondary | ICD-10-CM | POA: Diagnosis not present

## 2023-03-10 DIAGNOSIS — I2699 Other pulmonary embolism without acute cor pulmonale: Secondary | ICD-10-CM | POA: Diagnosis not present

## 2023-03-10 DIAGNOSIS — R7989 Other specified abnormal findings of blood chemistry: Secondary | ICD-10-CM | POA: Diagnosis not present

## 2023-03-10 LAB — CBC
HCT: 34 % — ABNORMAL LOW (ref 36.0–46.0)
Hemoglobin: 10.4 g/dL — ABNORMAL LOW (ref 12.0–15.0)
MCH: 29.7 pg (ref 26.0–34.0)
MCHC: 30.6 g/dL (ref 30.0–36.0)
MCV: 97.1 fL (ref 80.0–100.0)
Platelets: 341 10*3/uL (ref 150–400)
RBC: 3.5 MIL/uL — ABNORMAL LOW (ref 3.87–5.11)
RDW: 14.9 % (ref 11.5–15.5)
WBC: 8.8 10*3/uL (ref 4.0–10.5)
nRBC: 0 % (ref 0.0–0.2)

## 2023-03-10 LAB — BASIC METABOLIC PANEL
Anion gap: 9 (ref 5–15)
BUN: 15 mg/dL (ref 8–23)
CO2: 28 mmol/L (ref 22–32)
Calcium: 10 mg/dL (ref 8.9–10.3)
Chloride: 100 mmol/L (ref 98–111)
Creatinine, Ser: 1.23 mg/dL — ABNORMAL HIGH (ref 0.44–1.00)
GFR, Estimated: 48 mL/min — ABNORMAL LOW (ref >60)
Glucose, Bld: 96 mg/dL (ref 70–99)
Potassium: 4 mmol/L (ref 3.5–5.1)
Sodium: 137 mmol/L (ref 135–145)

## 2023-03-10 LAB — HEPARIN LEVEL (UNFRACTIONATED)
Heparin Unfractionated: 0.55 [IU]/mL (ref 0.30–0.70)
Heparin Unfractionated: 0.57 [IU]/mL (ref 0.30–0.70)

## 2023-03-10 MED ORDER — APIXABAN 5 MG PO TABS
5.0000 mg | ORAL_TABLET | Freq: Two times a day (BID) | ORAL | Status: DC
  Administered 2023-03-10 – 2023-03-12 (×5): 5 mg via ORAL
  Filled 2023-03-10: qty 1
  Filled 2023-03-10: qty 2
  Filled 2023-03-10 (×3): qty 1

## 2023-03-10 MED ORDER — ORAL CARE MOUTH RINSE: 15.0000 mL | OROMUCOSAL | Status: DC | PRN

## 2023-03-10 MED ORDER — ARFORMOTEROL TARTRATE 15 MCG/2ML IN NEBU
15.0000 ug | INHALATION_SOLUTION | Freq: Two times a day (BID) | RESPIRATORY_TRACT | Status: DC
  Administered 2023-03-10 – 2023-03-12 (×5): 15 ug via RESPIRATORY_TRACT
  Filled 2023-03-10 (×5): qty 2

## 2023-03-10 MED ORDER — UMECLIDINIUM BROMIDE 62.5 MCG/ACT IN AEPB
1.0000 | INHALATION_SPRAY | Freq: Every day | RESPIRATORY_TRACT | Status: DC
  Administered 2023-03-12: 1 via RESPIRATORY_TRACT
  Filled 2023-03-10 (×2): qty 7

## 2023-03-10 MED ORDER — ALBUTEROL SULFATE (2.5 MG/3ML) 0.083% IN NEBU: 2.5000 mg | INHALATION_SOLUTION | RESPIRATORY_TRACT | Status: DC | PRN

## 2023-03-10 NOTE — Progress Notes (Signed)
Susan Holder is alert and oriented x4. No complaints of pain. Removed 250 ml of dark red blood through pleurx drain and placed new clean and dry sterile bandage. No complaints of pain or shortness of breath noted during drainage and after. Continues on [place] at 2L.

## 2023-03-10 NOTE — Progress Notes (Signed)
PHARMACY - ANTICOAGULATION CONSULT NOTE  Pharmacy Consult for IV heparin transition to apixaban Indication: pulmonary embolus and AF  No Known Allergies  Patient Measurements: Height: 5\' 6"  (167.6 cm) Weight: 68.5 kg (151 lb) IBW/kg (Calculated) : 59.3 Heparin Dosing Weight: 68.5 kg  Vital Signs: Temp: 99.5 F (37.5 C) (11/12 1101) Temp Source: Oral (11/12 1101) BP: 106/58 (11/12 1101) Pulse Rate: 109 (11/12 1101)  Labs: Recent Labs    03/08/23 2157 03/08/23 2355 03/09/23 0829 03/09/23 1354 03/09/23 1708 03/09/23 2251 03/10/23 0345 03/10/23 1016  HGB 11.1*  --   --   --   --   --  10.4*  --   HCT 36.7  --   --   --   --   --  34.0*  --   PLT 332  --   --   --   --   --  341  --   HEPARINUNFRC  --   --   --    < >  --  0.98* 0.55 0.57  CREATININE 0.98  --   --   --   --   --  1.23*  --   TROPONINIHS 381* 380* 427*  --  694*  --   --   --    < > = values in this interval not displayed.    Estimated Creatinine Clearance: 40.4 mL/min (A) (by C-G formula based on SCr of 1.23 mg/dL (H)).   Medical History: Past Medical History:  Diagnosis Date   Acid reflux 09/21/2018   Adenocarcinoma of right lung, stage 4 (HCC) 11/17/2017   ADHD (attention deficit hyperactivity disorder)    patient denies this dx as of 03/10/22   Allergic rhinitis 08/22/2013   ANA positive 05/29/2016   Anemia    Aortic atherosclerosis (HCC) 10/23/2020   Brain metastases 03/29/2019   COPD (chronic obstructive pulmonary disease) (HCC)    Coronary artery calcification seen on CT scan 10/23/2020   patient is unaware of this dx as of 03/10/22   DDD (degenerative disc disease), cervical 05/29/2016   patient denies this dx as of 03/10/22   Dyspnea    oxygen 2L via [place]   Encounter for antineoplastic chemotherapy 11/17/2017   Encounter for antineoplastic immunotherapy 02/01/2018   Goals of care, counseling/discussion 11/17/2017   High risk medication use 05/29/2016   04/29/2016: ==> plq 200 am &  100 qhs(adeq response).   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 year old female admitted on 03/08/2023 with concern for PE. Of note, PMH significant for NSCLC with  brain mets present. No anticoagulation prior to admission. Pharmacy consulted to dose heparin.  HL supratherapeutic at >1.1 to 0.98 (drawn ~6h after heparin restarted). Discussed with phlebotomist and patient, level was drawn from opposite arm from infusion. No new bleeding noted.    11/12 AM update: HL 0.57- supratherapeutic No signs of bleeding or issues with heparin infusion as per nursing  11/12 PM Received a consult to transition to apixaban for NVAF + PE  Goal of Therapy:  Monitor platelets by anticoagulation protocol: Yes    Plan:  Stop heparin infusion Start apixaban 5 mg po bid (omit load phase due to high risk of bleed with brain mets) Spoke with Onc PharmD and Hem/Onc MD  Daily CBC, and monitoring for bleeding Pharmacy will sign off of consult but continue to monitor making recs prn.   Thank you for allowing pharmacy to participate in this patient's care.  Greta Doom BS, PharmD, BCPS Clinical  Pharmacist 03/10/2023 1:55 PM  Contact: [phone] after 3 PM  "Be curious, not judgmental..." -Debbora Dus

## 2023-03-10 NOTE — Progress Notes (Signed)
PHARMACY - ANTICOAGULATION CONSULT NOTE  Pharmacy Consult for IV heparin Indication: pulmonary embolus  No Known Allergies  Patient Measurements: Height: 5\' 6"  (167.6 cm) Weight: 68.5 kg (151 lb) IBW/kg (Calculated) : 59.3 Heparin Dosing Weight: 68.5 kg  Vital Signs: Temp: 98.1 F (36.7 C) (11/12 0700) Temp Source: Oral (11/12 0700) BP: 113/58 (11/12 0700) Pulse Rate: 102 (11/12 0700)  Labs: Recent Labs    03/08/23 2157 03/08/23 2355 03/09/23 0829 03/09/23 1354 03/09/23 1708 03/09/23 2251 03/10/23 0345 03/10/23 1016  HGB 11.1*  --   --   --   --   --  10.4*  --   HCT 36.7  --   --   --   --   --  34.0*  --   PLT 332  --   --   --   --   --  341  --   HEPARINUNFRC  --   --   --    < >  --  0.98* 0.55 0.57  CREATININE 0.98  --   --   --   --   --  1.23*  --   TROPONINIHS 381* 380* 427*  --  694*  --   --   --    < > = values in this interval not displayed.    Estimated Creatinine Clearance: 40.4 mL/min (A) (by C-G formula based on SCr of 1.23 mg/dL (H)).   Medical History: Past Medical History:  Diagnosis Date   Acid reflux 09/21/2018   Adenocarcinoma of right lung, stage 4 (HCC) 11/17/2017   ADHD (attention deficit hyperactivity disorder)    patient denies this dx as of 03/10/22   Allergic rhinitis 08/22/2013   ANA positive 05/29/2016   Anemia    Aortic atherosclerosis (HCC) 10/23/2020   Brain metastases 03/29/2019   COPD (chronic obstructive pulmonary disease) (HCC)    Coronary artery calcification seen on CT scan 10/23/2020   patient is unaware of this dx as of 03/10/22   DDD (degenerative disc disease), cervical 05/29/2016   patient denies this dx as of 03/10/22   Dyspnea    oxygen 2L via [place]   Encounter for antineoplastic chemotherapy 11/17/2017   Encounter for antineoplastic immunotherapy 02/01/2018   Goals of care, counseling/discussion 11/17/2017   High risk medication use 05/29/2016   04/29/2016: ==> plq 200 am & 100 qhs(adeq response).    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 year old female admitted on 03/08/2023 with concern for PE. Of note, PMH significant for NSCLC with brain mets present. No anticoagulation  prior to admission. Pharmacy consulted to dose heparin.  HL supratherapeutic at >1.1 to 0.98 (drawn ~6h after heparin restarted). Discussed with phlebotomist and patient, level was drawn from opposite arm from infusion. No new bleeding noted.    11/12 AM update: HL 0.57- supratherapeutic No signs of bleeding or issues with heparin infusion as per nursing  Goal of Therapy:  Heparin level 0.3 - 0.5 units/ml Monitor platelets by anticoagulation protocol: Yes    Plan:  Decrease heparin to 700 units/hr Follow up heparin level at 8 hours Daily heparin level, CBC, and monitoring for bleeding F/u plans for anticoagulation   Thank you for allowing pharmacy to participate in this patient's care.  Greta Doom BS, PharmD, BCPS Clinical Pharmacist 03/10/2023 12:09 PM  Contact: [phone] after 3 PM  "Be curious, not judgmental..." -Debbora Dus

## 2023-03-10 NOTE — Progress Notes (Addendum)
PROGRESS NOTE        PATIENT DETAILS Name: Susan Holder Age: 67 y.o. Sex: female Date of Birth: Jul 25, 1955 Admit Date: 03/08/2023 Admitting Physician Marinda Elk, MD ONG:[id number], Ebbie Ridge, MD  Brief Summary: Patient is a 67 y.o.  female history of non-small cell cancer-malignant pleural effusion-Pleurx catheter in place-presented with chest pain x 2 days, exertional dyspnea for the past several weeks-found to have PE and subsequently admitted to the hospitalist service.  Significant events: 11/10>> admit to [facility]  Significant studies: 11/11>> CTA chest: Pulmonary embolism-multiple nonocclusive/segmental/subsegmental-left upper lobe 11/11>> echo: EF 45-50%, global hypokinesis.  RV systolic function mildly reduced. 11/11>> bilateral lower extremity Dopplers: Bilateral peroneal vein DVT.  Significant microbiology data: None  Procedures: None  Consults: None  Subjective: No further chest pain.  Lying comfortably in bed.  Objective: Vitals: Blood pressure (!) 113/58, pulse (!) 102, temperature 98.1 F (36.7 C), temperature source Oral, resp. rate (!) 21, height 5\' 6"  (1.676 m), weight 68.5 kg, last menstrual period 04/28/2000, SpO2 90%.   Exam: Gen Exam:Alert awake-not in any distress HEENT:atraumatic, normocephalic Chest: B/L clear to auscultation anteriorly CVS:S1S2 regular Abdomen:soft non tender, non distended Extremities:no edema Neurology: Non focal Skin: no rash  Pertinent Labs/Radiology:    Latest Ref Rng & Units 03/10/2023    3:45 AM 03/08/2023    9:57 PM 02/11/2023    8:56 AM  CBC  WBC 4.0 - 10.5 K/uL 8.8  8.5  8.1   Hemoglobin 12.0 - 15.0 g/dL 24.4  01.0  27.2   Hematocrit 36.0 - 46.0 % 34.0  36.7  37.4   Platelets 150 - 400 K/uL 341  332  262     Lab Results  Component Value Date   NA 137 03/10/2023   K 4.0 03/10/2023   CL 100 03/10/2023   CO2 28 03/10/2023      Assessment/Plan: Pulmonary embolism with  bilateral lower extremity DVT Likely provoked by underlying malignancy Hemodynamically stable-no clinical signs of RV failure. Discussed with Dr. Daralene Milch to transition to Eliquis Discussed with patient at length-she is aware of risk/benefits of anticoagulation-with underlying malignancy/brain mets-at this time no obvious contraindications-and benefits outweigh risks.  Elevated troponin levels Likely due to PE No further chest pain Echo essentially the same as in 2022 Do not think she is a good candidate for further cardiac workup-given her advanced malignancy.  Stage IV adenocarcinoma Malignant pleural effusion Currently on observation-followed by Dr. Arbutus Ped Pleurex catheter in place-continue to drain every 2-3 days.  Acute on chronic hypoxic respiratory failure Secondary to PE Usually on 2 L-was on 4 L overnight-has been transitioned back to 2 L  Rheumatoid arthritis Stable Plaquenil  COPD Stable Continue bronchodilators  Normocytic anemia Secondary to malignancy Stable for close periodic CBC monitoring  Debility/deconditioning Mobilize with PT/OT and see how she does.  BMI: Estimated body mass index is 24.37 kg/m as calculated from the following:   Height as of this encounter: 5\' 6"  (1.676 m).   Weight as of this encounter: 68.5 kg.   Code status:   Code Status: Full Code   DVT Prophylaxis: IV heparin>> Eliquis    Family Communication: Amedeo Gory Colston-[phone] updated over the phone on 11/12   Disposition Plan: Status is: Inpatient Remains inpatient appropriate because: Severity of illness   Planned Discharge Destination:Home health   Diet: Diet Order  Diet Heart Room service appropriate? Yes; Fluid consistency: Thin  Diet effective now                     Antimicrobial agents: Anti-infectives (From admission, onward)    Start     Dose/Rate Route Frequency Ordered Stop   03/09/23 1000  hydroxychloroquine  (PLAQUENIL) tablet 200 mg        200 mg Oral Daily 03/09/23 0529          MEDICATIONS: Scheduled Meds:  hydroxychloroquine  200 mg Oral Daily   pantoprazole  40 mg Oral Daily   Continuous Infusions:  heparin 750 Units/hr (03/10/23 0600)   PRN Meds:.acetaminophen, ondansetron **OR** ondansetron (ZOFRAN) IV, mouth rinse   I have personally reviewed following labs and imaging studies  LABORATORY DATA: CBC: Recent Labs  Lab 03/08/23 2157 03/10/23 0345  WBC 8.5 8.8  HGB 11.1* 10.4*  HCT 36.7 34.0*  MCV 97.6 97.1  PLT 332 341    Basic Metabolic Panel: Recent Labs  Lab 03/08/23 2157 03/10/23 0345  NA 138 137  K 3.8 4.0  CL 101 100  CO2 27 28  GLUCOSE 108* 96  BUN 15 15  CREATININE 0.98 1.23*  CALCIUM 9.7 10.0    GFR: Estimated Creatinine Clearance: 40.4 mL/min (A) (by C-G formula based on SCr of 1.23 mg/dL (H)).  Liver Function Tests: No results for input(s): "AST", "ALT", "ALKPHOS", "BILITOT", "PROT", "ALBUMIN" in the last 168 hours. No results for input(s): "LIPASE", "AMYLASE" in the last 168 hours. No results for input(s): "AMMONIA" in the last 168 hours.  Coagulation Profile: No results for input(s): "INR", "PROTIME" in the last 168 hours.  Cardiac Enzymes: No results for input(s): "CKTOTAL", "CKMB", "CKMBINDEX", "TROPONINI" in the last 168 hours.  BNP (last 3 results) No results for input(s): "PROBNP" in the last 8760 hours.  Lipid Profile: No results for input(s): "CHOL", "HDL", "LDLCALC", "TRIG", "CHOLHDL", "LDLDIRECT" in the last 72 hours.  Thyroid Function Tests: No results for input(s): "TSH", "T4TOTAL", "FREET4", "T3FREE", "THYROIDAB" in the last 72 hours.  Anemia Panel: No results for input(s): "VITAMINB12", "FOLATE", "FERRITIN", "TIBC", "IRON", "RETICCTPCT" in the last 72 hours.  Urine analysis:    Component Value Date/Time   COLORURINE STRAW (A) 10/30/2021 1520   APPEARANCEUR CLEAR 10/30/2021 1520   LABSPEC 1.008 10/30/2021 1520    PHURINE 5.0 10/30/2021 1520   GLUCOSEU NEGATIVE 10/30/2021 1520   HGBUR NEGATIVE 10/30/2021 1520   BILIRUBINUR NEGATIVE 10/30/2021 1520   KETONESUR NEGATIVE 10/30/2021 1520   PROTEINUR NEGATIVE 10/30/2021 1520   NITRITE NEGATIVE 10/30/2021 1520   LEUKOCYTESUR SMALL (A) 10/30/2021 1520    Sepsis Labs: Lactic Acid, Venous No results found for: "LATICACIDVEN"  MICROBIOLOGY: No results found for this or any previous visit (from the past 240 hour(s)).  RADIOLOGY STUDIES/RESULTS: VAS [place] LOWER EXTREMITY VENOUS (DVT)  Result Date: 03/09/2023  Lower Venous DVT Study Patient Name:  Susan Holder  Date of Exam:   03/09/2023 Medical Rec #: [id number]          Accession #:    [id number] Date of Birth: December 02, 1955           Patient Gender: F Patient Age:   60 years Exam Location:  [facility] Procedure:      VAS [place] LOWER EXTREMITY VENOUS (DVT) Referring Phys: Lyda Perone --------------------------------------------------------------------------------  Indications: Pulmonary embolism, SOB, and chest pain.  Risk Factors: Confirmed PE. Comparison Study: No prior study. Performing Technologist: Fernande Bras  Examination  Guidelines: A complete evaluation includes B-mode imaging, spectral Doppler, color Doppler, and power Doppler as needed of all accessible portions of each vessel. Bilateral testing is considered an integral part of a complete examination. Limited examinations for reoccurring indications may be performed as noted. The reflux portion of the exam is performed with the patient in reverse Trendelenburg.  +---------+---------------+---------+-----------+----------+--------------+ RIGHT    CompressibilityPhasicitySpontaneityPropertiesThrombus Aging +---------+---------------+---------+-----------+----------+--------------+ CFV      Full           Yes      Yes                                 +---------+---------------+---------+-----------+----------+--------------+ SFJ       Full                                                        +---------+---------------+---------+-----------+----------+--------------+ FV Prox  Full                                                        +---------+---------------+---------+-----------+----------+--------------+ FV Mid   Full                                                        +---------+---------------+---------+-----------+----------+--------------+ FV DistalFull                                                        +---------+---------------+---------+-----------+----------+--------------+ PFV      Full                                                        +---------+---------------+---------+-----------+----------+--------------+ POP      Full           Yes      Yes                                 +---------+---------------+---------+-----------+----------+--------------+ PTV      Full                                                        +---------+---------------+---------+-----------+----------+--------------+ PERO     None           No       No                                  +---------+---------------+---------+-----------+----------+--------------+   +---------+---------------+---------+-----------+----------+--------------+  LEFT     CompressibilityPhasicitySpontaneityPropertiesThrombus Aging +---------+---------------+---------+-----------+----------+--------------+ CFV      Full           Yes      Yes                                 +---------+---------------+---------+-----------+----------+--------------+ SFJ      Full                                                        +---------+---------------+---------+-----------+----------+--------------+ FV Prox  Full                                                        +---------+---------------+---------+-----------+----------+--------------+ FV Mid   Full                                                         +---------+---------------+---------+-----------+----------+--------------+ FV DistalFull                                                        +---------+---------------+---------+-----------+----------+--------------+ PFV      Full                                                        +---------+---------------+---------+-----------+----------+--------------+ POP      Full           Yes      Yes                                 +---------+---------------+---------+-----------+----------+--------------+ PTV      Partial        No       No                                  +---------+---------------+---------+-----------+----------+--------------+ PERO     Full                                                        +---------+---------------+---------+-----------+----------+--------------+     Summary: BILATERAL: -No evidence of popliteal cyst, bilaterally. RIGHT: - Findings consistent with acute deep vein thrombosis involving the right peroneal veins.   LEFT: - Findings consistent with acute deep vein thrombosis involving the left posterior tibial veins.   *See table(s) above for measurements and observations. Electronically signed  by Gerarda Fraction on 03/09/2023 at 6:13:19 PM.    Final    ECHOCARDIOGRAM COMPLETE  Result Date: 03/09/2023    ECHOCARDIOGRAM REPORT   Patient Name:   Susan Holder Date of Exam: 03/09/2023 Medical Rec #:  [id number]         Height:       66.0 in Accession #:    [id number]        Weight:       151.0 lb Date of Birth:  12-01-1955          BSA:          1.775 m Patient Age:    67 years          BP:           120/69 mmHg Patient Gender: F                 HR:           97 bpm. Exam Location:  Inpatient Procedure: 2D Echo, Cardiac Doppler and Color Doppler Indications:    Pulmonary Embolus I26.09  History:        Patient has prior history of Echocardiogram examinations, most                 recent 11/09/2020. COPD.  Sonographer:     Harriette Bouillon RDCS Referring Phys: [phone] JARED M GARDNER IMPRESSIONS  1. Left ventricular ejection fraction, by estimation, is 45 to 50%. The left ventricle has mildly decreased function. The left ventricle demonstrates global hypokinesis appearing worse at the apex. There is mild concentric left ventricular hypertrophy. Left ventricular diastolic parameters are consistent with Grade I diastolic dysfunction (impaired relaxation).  2. D-shaped septum suggestive of RV pressure/volume overload. Right ventricular systolic function is mildly reduced. The right ventricular size is mildly enlarged. There is mildly elevated pulmonary artery systolic pressure. The estimated right ventricular systolic pressure is 41.9 mmHg.  3. The mitral valve is normal in structure. Trivial mitral valve regurgitation. No evidence of mitral stenosis.  4. The tricuspid valve is abnormal. Tricuspid valve regurgitation is moderate.  5. The aortic valve is tricuspid. Aortic valve regurgitation is moderate. No aortic stenosis is present.  6. The inferior vena cava is normal in size with greater than 50% respiratory variability, suggesting right atrial pressure of 3 mmHg. FINDINGS  Left Ventricle: Left ventricular ejection fraction, by estimation, is 45 to 50%. The left ventricle has mildly decreased function. The left ventricle demonstrates global hypokinesis. The left ventricular internal cavity size was normal in size. There is  mild concentric left ventricular hypertrophy. Left ventricular diastolic parameters are consistent with Grade I diastolic dysfunction (impaired relaxation). Right Ventricle: D-shaped septum suggestive of RV pressure/volume overload. The right ventricular size is mildly enlarged. No increase in right ventricular wall thickness. Right ventricular systolic function is mildly reduced. There is mildly elevated pulmonary artery systolic pressure. The tricuspid regurgitant velocity is 3.12 m/s, and with an assumed right atrial  pressure of 3 mmHg, the estimated right ventricular systolic pressure is 41.9 mmHg. Left Atrium: Left atrial size was normal in size. Right Atrium: Right atrial size was normal in size. Pericardium: There is no evidence of pericardial effusion. Mitral Valve: The mitral valve is normal in structure. Trivial mitral valve regurgitation. No evidence of mitral valve stenosis. Tricuspid Valve: The tricuspid valve is abnormal. Tricuspid valve regurgitation is moderate. Aortic Valve: The aortic valve is tricuspid. Aortic valve regurgitation is moderate. Aortic regurgitation PHT measures 800 msec. No  aortic stenosis is present. Pulmonic Valve: The pulmonic valve was normal in structure. Pulmonic valve regurgitation is not visualized. Aorta: The aortic root is normal in size and structure. Venous: The inferior vena cava is normal in size with greater than 50% respiratory variability, suggesting right atrial pressure of 3 mmHg. IAS/Shunts: No atrial level shunt detected by color flow Doppler.  LEFT VENTRICLE PLAX 2D LVIDd:         3.90 cm   Diastology LVIDs:         2.90 cm   LV e' medial:    9.14 cm/s LV PW:         1.00 cm   LV E/e' medial:  6.8 LV IVS:        1.10 cm   LV e' lateral:   15.60 cm/s LVOT diam:     1.90 cm   LV E/e' lateral: 4.0 LV SV:         31 LV SV Index:   17 LVOT Area:     2.84 cm  RIGHT VENTRICLE            IVC RV S prime:     9.57 cm/s  IVC diam: 1.30 cm TAPSE (M-mode): 1.1 cm LEFT ATRIUM             Index       RIGHT ATRIUM           Index LA diam:        2.50 cm 1.41 cm/m  RA Area:     10.50 cm LA Vol (A2C):   9.4 ml  5.30 ml/m  RA Volume:   21.70 ml  12.23 ml/m LA Vol (A4C):   10.5 ml 5.92 ml/m LA Biplane Vol: 9.9 ml  5.60 ml/m  AORTIC VALVE LVOT Vmax:   65.50 cm/s LVOT Vmean:  44.600 cm/s LVOT VTI:    0.108 m AI PHT:      800 msec  AORTA Ao Root diam: 2.80 cm Ao Asc diam:  2.80 cm MITRAL VALVE               TRICUSPID VALVE MV Area (PHT): 3.83 cm    TR Peak grad:   38.9 mmHg MV Decel Time:  198 msec    TR Vmax:        312.00 cm/s MV E velocity: 62.40 cm/s MV A velocity: 73.40 cm/s  SHUNTS MV E/A ratio:  0.85        Systemic VTI:  0.11 m                            Systemic Diam: 1.90 cm Dalton McleanMD Electronically signed by Wilfred Lacy Signature Date/Time: 03/09/2023/9:25:05 AM    Final    CT Angio Chest PE W and/or Wo Contrast  Result Date: 03/09/2023 CLINICAL DATA:  67 year old female with history of acute onset of chest pain. Evaluate for pulmonary embolism. History of lung cancer. * Tracking Code: BO * EXAM: CT ANGIOGRAPHY CHEST WITH CONTRAST TECHNIQUE: Multidetector CT imaging of the chest was performed using the standard protocol during bolus administration of intravenous contrast. Multiplanar CT image reconstructions and MIPs were obtained to evaluate the vascular anatomy. RADIATION DOSE REDUCTION: This exam was performed according to the departmental dose-optimization program which includes automated exposure control, adjustment of the mA and/or kV according to patient size and/or use of iterative reconstruction technique. CONTRAST:  75mL OMNIPAQUE IOHEXOL 350 MG/ML SOLN COMPARISON:  Chest  CT 02/11/2023. FINDINGS: Cardiovascular: There are multiple filling defects in the left-sided pulmonary arterial tree involving segmental and subsegmental sized pulmonary artery branches to the left upper lobe, indicative of pulmonary embolism. These appear nonocclusive at this time. No definite right-sided filling defects are noted. Heart size is normal. There is no significant pericardial fluid, thickening or pericardial calcification. Atherosclerotic calcifications in the thoracic aorta as well as the left main, left anterior descending and right coronary arteries. Mediastinum/Nodes: Prominent nodal tissue is noted measuring up to 1.4 cm in short axis in the subcarinal region. Architectural distortion in the right hilar region noted. No definite discrete hilar lymphadenopathy confidently  identified. Esophagus is unremarkable in appearance. No axillary lymphadenopathy. Lungs/Pleura: Mass-like architectural distortion in the perihilar aspect of the right lung, likely reflective of chronic postradiation mass-like fibrosis. Right-sided chest tube in position with tip extending into the posterior aspect of the upper right hemithorax within a large right-sided pleural effusion which has some associated pleural enhancement. In the posterior aspect of the left upper lobe (axial image 53 of series 10) there is a 9 x 9 mm nodule adjacent to a fiducial marker, corresponding to the partially treated neoplasm. Areas of ground-glass attenuation and septal thickening are noted in the surrounding lung, likely to reflect evolving postradiation pneumonitis. No left pleural effusion. No other new suspicious appearing pulmonary nodules or masses are noted. Diffuse bronchial wall thickening with moderate centrilobular and mild paraseptal emphysema. Upper Abdomen: Aortic atherosclerosis. Musculoskeletal: There are no aggressive appearing lytic or blastic lesions noted in the visualized portions of the skeleton. Review of the MIP images confirms the above findings. IMPRESSION: 1. Study is positive for pulmonary embolism with multiple nonocclusive segmental and subsegmental sized filling defects to the left upper lobe, as above. 2. Right-sided chest tube stable in position with tip in the posterior aspect of the upper right hemithorax. Large chronic right pleural effusion with pleural enhancement noted. 3. Treated nodule in the posterior aspect of the left upper lobe again noted, with evolving postradiation changes in the surrounding lung parenchyma. 4. Diffuse bronchial wall thickening with moderate centrilobular and mild paraseptal emphysema; imaging findings suggestive of underlying COPD. 5. Aortic atherosclerosis, in addition to left main and 2 vessel coronary artery disease. Please note that although the presence of  coronary artery calcium documents the presence of coronary artery disease, the severity of this disease and any potential stenosis cannot be assessed on this non-gated CT examination. Assessment for potential risk factor modification, dietary therapy or pharmacologic therapy may be warranted, if clinically indicated. Critical Value/emergent results were discussed by telephone at the time of interpretation on 03/09/2023 at 5:44 am to provider Dr. Julian Reil, who verbally acknowledged these results. Aortic Atherosclerosis (ICD10-I70.0) and Emphysema (ICD10-J43.9). Electronically Signed   By: Trudie Reed M.D.   On: 03/09/2023 05:44   DG Chest Portable 1 View  Result Date: 03/08/2023 CLINICAL DATA:  Chest pain and shortness of breath EXAM: PORTABLE CHEST 1 VIEW COMPARISON:  Radiograph 01/22/2023 and CT 02/11/2023 FINDINGS: Right chest tube in the moderate-to-large partially loculated right pleural effusion which is grossly similar to CT chest 02/11/2023. Associated airspace and interstitial opacities the right mid and lower lung. Interstitial thickening in the left mid and lower lung. Small left pleural effusion. No pneumothorax. IMPRESSION: 1. Right chest tube in the moderate-to-large partially loculated right pleural effusion which is grossly similar to CT chest 02/11/2023. 2. Airspace and interstitial opacities in the bilateral mid and lower lungs are slightly increased compared to radiographs 01/22/2023  and are suspicious for edema or infection. Electronically Signed   By: Minerva Fester M.D.   On: 03/08/2023 23:39     LOS: 1 day   Jeoffrey Massed, MD  [facility]    To contact the attending provider between 7A-7P or the covering provider during after hours 7P-7A, please log into the web site [email or web address] and access using universal [place] password for that web site. If you do not have the password, please call the hospital operator.  03/10/2023, 9:23 AM

## 2023-03-10 NOTE — Evaluation (Signed)
Physical Therapy Evaluation Patient Details Name: Susan Holder MRN: [id number] DOB: 03/03/1956 Today's Date: 03/10/2023  History of Present Illness  Pt is a 67 y.o. F who presents 03/08/2023 with PE and bilateral peroneal vein DVT. Significant PMH: non small cell CA, pleural effusion - Pleurx catheter in place, RA.  Clinical Impression  PTA, pt lives with her 80 y.o. granddaughter, is independent with mobility and requires occasional assist with ADL's. Pt presents with significant decline in function secondary to weakness/deconditioning, decreased cardiopulmonary endurance, impaired standing balance. Pt also with some noted anxiety regarding mobilizing and nausea/vomiting. Pt requiring minimal assist for transfers; only able to take ~2 steps forward and backwards before fatiguing. HR 118-133 bpm, bumped up from 3L O2 to 6L O2 for activity due to desat to 76% (poor pleth). Pt would greatly benefit from intensive acute rehabilitation in order to address deficits, maximize functional mobility and decrease caregiver burden.      If plan is discharge home, recommend the following: A little help with walking and/or transfers;A little help with bathing/dressing/bathroom;Assistance with cooking/housework;Assist for transportation;Help with stairs or ramp for entrance   Can travel by private vehicle        Equipment Recommendations BSC/3in1  Recommendations for Other Services       Functional Status Assessment Patient has had a recent decline in their functional status and demonstrates the ability to make significant improvements in function in a reasonable and predictable amount of time.     Precautions / Restrictions Precautions Precautions: Fall;Other (comment) Precaution Comments: watch HR/O2 Restrictions Weight Bearing Restrictions: No      Mobility  Bed Mobility Overal bed mobility: Needs Assistance Bed Mobility: Sit to Supine       Sit to supine: Supervision         Transfers Overall transfer level: Needs assistance Equipment used: Rolling walker (2 wheels), None Transfers: Sit to/from Stand Sit to Stand: Min assist           General transfer comment: Pt initially standing with RW, with cues for hand placement, able to take ~2 steps forward and backwards. Subsequent attempt performing stand pivot from chair to bed without AD. MinA for balance and assist    Ambulation/Gait                  Stairs            Wheelchair Mobility     Tilt Bed    Modified Rankin (Stroke Patients Only)       Balance Overall balance assessment: Needs assistance Sitting-balance support: Feet supported Sitting balance-Leahy Scale: Fair     Standing balance support: Bilateral upper extremity supported, Reliant on assistive device for balance Standing balance-Leahy Scale: Poor                               Pertinent Vitals/Pain Pain Assessment Pain Assessment: No/denies pain    Home Living Family/patient expects to be discharged to:: Private residence Living Arrangements:  (granddaughter) Available Help at Discharge: Family Type of Home: Apartment Home Access: Stairs to enter Entrance Stairs-Rails: None Entrance Stairs-Number of Steps: 3   Home Layout: One level Home Equipment: Wheelchair - Careers adviser (comment);Cane - single point (portable oxygen)      Prior Function Prior Level of Function : Needs assist             Mobility Comments: uses cane towards end of week when energy level decreases ADLs Comments:  granddaughter assists with washing back     Extremity/Trunk Assessment   Upper Extremity Assessment Upper Extremity Assessment: Defer to OT evaluation    Lower Extremity Assessment Lower Extremity Assessment: Generalized weakness       Communication   Communication Communication: Other (comment) (slowed speech)  Cognition Arousal: Alert Behavior During Therapy: Flat affect Overall Cognitive  Status: Within Functional Limits for tasks assessed                                          General Comments      Exercises     Assessment/Plan    PT Assessment Patient needs continued PT services  PT Problem List Decreased strength;Decreased activity tolerance;Decreased balance;Decreased mobility;Cardiopulmonary status limiting activity       PT Treatment Interventions DME instruction;Gait training;Stair training;Functional mobility training;Therapeutic activities;Therapeutic exercise;Balance training;Patient/family education    PT Goals (Current goals can be found in the Care Plan section)  Acute Rehab PT Goals Patient Stated Goal: get stronger PT Goal Formulation: With patient Time For Goal Achievement: 03/24/23 Potential to Achieve Goals: Fair    Frequency Min 1X/week     Co-evaluation               AM-PAC PT "6 Clicks" Mobility  Outcome Measure Help needed turning from your back to your side while in a flat bed without using bedrails?: A Little Help needed moving from lying on your back to sitting on the side of a flat bed without using bedrails?: A Little Help needed moving to and from a bed to a chair (including a wheelchair)?: A Little Help needed standing up from a chair using your arms (e.g., wheelchair or bedside chair)?: A Little Help needed to walk in hospital room?: Total Help needed climbing 3-5 steps with a railing? : Total 6 Click Score: 14    End of Session Equipment Utilized During Treatment: Gait belt;Oxygen Activity Tolerance: Patient limited by fatigue Patient left: in bed;with call bell/phone within reach Nurse Communication: Mobility status;Other (comment) (vomiting) PT Visit Diagnosis: Muscle weakness (generalized) (M62.81);Difficulty in walking, not elsewhere classified (R26.2)    Time: 1333-1410 PT Time Calculation (min) (ACUTE ONLY): 37 min   Charges:   PT Evaluation $PT Eval Moderate Complexity: 1 Mod PT  Treatments $Therapeutic Activity: 8-22 mins PT General Charges $$ ACUTE PT VISIT: 1 Visit         Lillia Pauls, PT, DPT Acute Rehabilitation Services Office [phone]   Norval Morton 03/10/2023, 4:26 PM

## 2023-03-10 NOTE — Progress Notes (Signed)
   03/10/23 1515  TOC Brief Assessment  Insurance and Status Reviewed ([employer] Medicare Choice PPO)  Patient has primary care physician Yes Janee Morn, Ebbie Ridge, MD)  Home environment has been reviewed From Home  Prior level of function: Independent  Prior/Current Home Services No current home services  Social Determinants of Health Reivew SDOH reviewed no interventions necessary  Readmission risk has been reviewed Yes (15%)  Transition of care needs no transition of care needs at this time   [facility] will continue to follow patient for any additional discharge needs

## 2023-03-11 ENCOUNTER — Other Ambulatory Visit (HOSPITAL_COMMUNITY): Payer: Self-pay

## 2023-03-11 DIAGNOSIS — C3491 Malignant neoplasm of unspecified part of right bronchus or lung: Secondary | ICD-10-CM | POA: Diagnosis not present

## 2023-03-11 DIAGNOSIS — I2699 Other pulmonary embolism without acute cor pulmonale: Secondary | ICD-10-CM | POA: Diagnosis not present

## 2023-03-11 DIAGNOSIS — M0579 Rheumatoid arthritis with rheumatoid factor of multiple sites without organ or systems involvement: Secondary | ICD-10-CM | POA: Diagnosis not present

## 2023-03-11 DIAGNOSIS — R7989 Other specified abnormal findings of blood chemistry: Secondary | ICD-10-CM | POA: Diagnosis not present

## 2023-03-11 LAB — CBC
HCT: 32.9 % — ABNORMAL LOW (ref 36.0–46.0)
Hemoglobin: 10.2 g/dL — ABNORMAL LOW (ref 12.0–15.0)
MCH: 29.7 pg (ref 26.0–34.0)
MCHC: 31 g/dL (ref 30.0–36.0)
MCV: 95.6 fL (ref 80.0–100.0)
Platelets: 299 10*3/uL (ref 150–400)
RBC: 3.44 MIL/uL — ABNORMAL LOW (ref 3.87–5.11)
RDW: 14.8 % (ref 11.5–15.5)
WBC: 13.9 10*3/uL — ABNORMAL HIGH (ref 4.0–10.5)
nRBC: 0 % (ref 0.0–0.2)

## 2023-03-11 NOTE — NC FL2 (Signed)
[place] MEDICAID FL2 LEVEL OF CARE FORM     IDENTIFICATION  Patient Name: Susan Holder Birthdate: [birth year] Sex: female Admission Date (Current Location): 03/08/2023  [facility] and [place] Number:  Producer, television/film/video and Address:  [place], [address], [place], [place] [address]      Provider Number: [id number]  Attending Physician Name and Address:  Maretta Bees, MD  Relative Name and Phone Number:       Current Level of Care: Hospital Recommended Level of Care: Skilled Nursing Facility Prior Approval Number:    Date Approved/Denied:   PASRR Number: [id number]  Discharge Plan: SNF    Current Diagnoses: Patient Active Problem List   Diagnosis Date Noted   Acute pulmonary embolism without acute cor pulmonale (HCC) 03/09/2023   Centrilobular emphysema (HCC) 11/20/2022   Malignant pleural effusion 11/20/2022   Chronic respiratory failure with hypoxia (HCC) 11/20/2022   Pneumonitis 03/11/2022   Adenocarcinoma of right lung, stage 4 (HCC) 02/20/2022   Unsteady gait when walking 11/19/2021   Dizziness 11/12/2021   S/P Left sided pigtail catheter placement 05/13/2021   Anemia 04/23/2021   Pleural effusion 04/12/2021   Malnutrition of moderate degree 02/09/2021   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   Aortic atherosclerosis (HCC) 10/23/2020   Coronary artery calcification seen on CT scan 10/23/2020   Pericardial effusion 10/23/2020   met lung ca 10/22/2020   Rheumatoid arthritis (HCC) 10/22/2020   Metastasis to brain (HCC) 03/29/2019   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) 11/04/2018   Acid reflux 09/21/2018   Hoarseness 03/16/2018   Encounter for antineoplastic immunotherapy 02/01/2018   Primary malignant neoplasm of bronchus of right lower lobe (HCC) 12/21/2017   Adenocarcinoma of right lung, stage 4 (HCC) 11/17/2017   Encounter for antineoplastic chemotherapy 11/17/2017   Goals of care,  counseling/discussion 11/17/2017   Pulmonary nodules    Left hand pain 03/06/2017   Rheumatoid arthritis involving multiple sites with positive rheumatoid factor (HCC) 05/29/2016   ANA positive 05/29/2016   Vitamin D deficiency 05/29/2016   High risk medication use 05/29/2016   Trigger finger, left ring finger 05/29/2016   Trigger finger, right ring finger 05/29/2016   DDD (degenerative disc disease), cervical 05/29/2016   Smoker 05/29/2016   Other fatigue 05/29/2016   Allergic rhinitis 08/22/2013    Orientation RESPIRATION BLADDER Height & Weight     Self, Time, Situation, Place  O2 (2L nasal cannula) Incontinent, External catheter Weight: 151 lb (68.5 kg) Height:  5\' 6"  (167.6 cm)  BEHAVIORAL SYMPTOMS/MOOD NEUROLOGICAL BOWEL NUTRITION STATUS      Continent Diet (See dc summary)  AMBULATORY STATUS COMMUNICATION OF NEEDS Skin   Limited Assist Verbally Normal                       Personal Care Assistance Level of Assistance  Bathing, Feeding, Dressing Bathing Assistance: Limited assistance Feeding assistance: Independent Dressing Assistance: Limited assistance     Functional Limitations Info             SPECIAL CARE FACTORS FREQUENCY  PT (By licensed PT), OT (By licensed OT)     PT Frequency: 5x/week OT Frequency: 5x/week            Contractures Contractures Info: Not present    Additional Factors Info  Code Status, Allergies Code Status Info: Full Allergies Info: NKA           Current Medications (03/11/2023):  This is  the current hospital active medication list Current Facility-Administered Medications  Medication Dose Route Frequency Provider Last Rate Last Admin   acetaminophen (TYLENOL) tablet 1,000 mg  1,000 mg Oral Q6H PRN Hillary Bow, DO   1,000 mg at 03/10/23 1615   albuterol (PROVENTIL) (2.5 MG/3ML) 0.083% nebulizer solution 2.5 mg  2.5 mg Nebulization Q4H PRN Maretta Bees, MD       apixaban Everlene Balls) tablet 5 mg  5 mg Oral  BID Reome, Earle J, RPH   5 mg at 03/10/23 2137   arformoterol (BROVANA) nebulizer solution 15 mcg  15 mcg Nebulization BID Maretta Bees, MD   15 mcg at 03/11/23 0732   And   umeclidinium bromide (INCRUSE ELLIPTA) 62.5 MCG/ACT 1 puff  1 puff Inhalation Daily Ghimire, Werner Lean, MD       hydroxychloroquine (PLAQUENIL) tablet 200 mg  200 mg Oral Daily Lyda Perone M, DO   200 mg at 03/10/23 0928   ondansetron (ZOFRAN) tablet 4 mg  4 mg Oral Q6H PRN Hillary Bow, DO       Or   ondansetron [facility]) injection 4 mg  4 mg Intravenous Q6H PRN Hillary Bow, DO   4 mg at 03/10/23 1224   Oral care mouth rinse  15 mL Mouth Rinse PRN Marinda Elk, MD       pantoprazole (PROTONIX) EC tablet 40 mg  40 mg Oral Daily Lyda Perone M, DO   40 mg at 03/10/23 4098     Discharge Medications: Please see discharge summary for a list of discharge medications.  Relevant Imaging Results:  Relevant Lab Results:   Additional Information SSn: [id number]  Mearl Latin, [place]

## 2023-03-11 NOTE — Progress Notes (Signed)
Inpatient Rehab Admissions Coordinator:   Consult received and chart reviewed.  With recent payor trends, we are unlikely to get approval from [facility] for a CIR admission with pt presentation.  Will sign off.   Estill Dooms, PT, DPT Admissions Coordinator [phone] 03/11/23  12:55 PM

## 2023-03-11 NOTE — Evaluation (Signed)
Occupational Therapy Evaluation Patient Details Name: Susan Holder MRN: [id number] DOB: 10-16-1955 Today's Date: 03/11/2023   History of Present Illness Pt is a 67 y.o. F who presents 03/08/2023 with PE and bilateral peroneal vein DVT. Significant PMH: non small cell CA, pleural effusion - Pleurx catheter in place, RA.   Clinical Impression   Pt admitted for above, PTA she was using 2L 02 and has support from family at home. Pt currently demonstrating need for 6L supplemental 02 with mobility but continues to desat with activity, she does get anxious which makes it harder for her to catch her breath. Pt completing ADLs with CGA  but demonstrates decreased activity tolerance and not able to stand/ambulate for too long without getting SOB. Pt would benefit from continued acute skilled OT services to address deficits and help transition to next level of care. CIR no longer following, Patient would benefit from post acute skilled rehab facility with <3 hours of therapy and 24/7 support but she is hopeful to progress to [facility].       If plan is discharge home, recommend the following: A little help with walking and/or transfers;A little help with bathing/dressing/bathroom;Other (comment) (supervision/CGA due to fatigue)    Functional Status Assessment  Patient has had a recent decline in their functional status and demonstrates the ability to make significant improvements in function in a reasonable and predictable amount of time.  Equipment Recommendations  BSC/3in1;Other (comment) (RW)    Recommendations for Other Services       Precautions / Restrictions Precautions Precautions: Fall;Other (comment) Precaution Comments: watch HR/O2 Restrictions Weight Bearing Restrictions: No      Mobility Bed Mobility   Bed Mobility: Sit to Supine     Supine to sit: Supervision     General bed mobility comments: Up in recliner on arrival    Transfers Overall transfer level: Needs  assistance Equipment used: Rolling walker (2 wheels) Transfers: Sit to/from Stand Sit to Stand: Contact guard assist           General transfer comment: cues for hand placement, STS from low chair also CGA      Balance Overall balance assessment: Needs assistance Sitting-balance support: Feet supported Sitting balance-Leahy Scale: Fair     Standing balance support: Bilateral upper extremity supported, Reliant on assistive device for balance Standing balance-Leahy Scale: Poor Standing balance comment: Reliant on RW                           ADL either performed or assessed with clinical judgement   ADL Overall ADL's : Needs assistance/impaired Eating/Feeding: Independent;Sitting   Grooming: Sitting;Set up;Contact guard assist Grooming Details (indicate cue type and reason): intially standing but pt BLEs began fatiguing and became SOB Upper Body Bathing: Sitting;Set up;Contact guard assist   Lower Body Bathing: Sitting/lateral leans;Contact guard assist   Upper Body Dressing : Sitting;Set up   Lower Body Dressing: Sitting/lateral leans;Supervision/safety   Toilet Transfer: Contact guard assist;Rolling walker (2 wheels);Stand-pivot Statistician Details (indicate cue type and reason): Pt not demonstrating capacity to ambulate to bathroom from recliner Toileting- Clothing Manipulation and Hygiene: Contact guard assist;Sit to/from stand       Functional mobility during ADLs: Contact guard assist;Rolling walker (2 wheels) General ADL Comments: pt ambulated around room but required seated rest break at [employer]  Pertinent Vitals/Pain Pain Assessment Pain Assessment: No/denies pain     Extremity/Trunk Assessment Upper Extremity Assessment Upper Extremity Assessment: Generalized weakness   Lower Extremity Assessment Lower Extremity Assessment: Generalized weakness       Communication  Communication Communication: Other (comment) (slowed speech) Cueing Techniques: Verbal cues   Cognition Arousal: Alert Behavior During Therapy: WFL for tasks assessed/performed Overall Cognitive Status: Within Functional Limits for tasks assessed                                       General Comments  Pt desatting to 87% on 6L while ambualting short distance, educated pt on pursed lip breathing but sp02 did not return to 96% until taking seated rest break. did desat to 77% while returning from sit>supine, pt repositioned and reinforced pursed lip breathing to return sp02 to normal levels.    Exercises     Shoulder Instructions      Home Living Family/patient expects to be discharged to:: Private residence Living Arrangements:  (granddaughter and cousin) Available Help at Discharge: Family;Available 24 hours/day Type of Home: Apartment Home Access: Stairs to enter Entrance Stairs-Number of Steps: 3 Entrance Stairs-Rails: None Home Layout: One level     Bathroom Shower/Tub: Tub/shower unit;Sponge bathes at baseline   [employer]: Standard     Home Equipment: Wheelchair - Careers adviser (comment);Cane - single point (portable oxygen)          Prior Functioning/Environment Prior Level of Function : Needs assist             Mobility Comments: ind no AD, uses cane towards end of week when energy level decreases ADLs Comments: ind with bathing and dressing. granddaughter assists with washing back, assisst with iADLs        OT Problem List: Decreased strength;Decreased activity tolerance;Impaired balance (sitting and/or standing);Cardiopulmonary status limiting activity      OT Treatment/Interventions: Self-care/ADL training;Balance training;Therapeutic exercise;Therapeutic activities;Patient/family education    OT Goals(Current goals can be found in the care plan section) Acute Rehab OT Goals Patient Stated Goal: To get better/stronger OT Goal  Formulation: With patient Time For Goal Achievement: 03/25/23 Potential to Achieve Goals: Good  OT Frequency: Min 1X/week    Co-evaluation              AM-PAC OT "6 Clicks" Daily Activity     Outcome Measure Help from another person eating meals?: None Help from another person taking care of personal grooming?: A Little Help from another person toileting, which includes using toliet, bedpan, or urinal?: A Little Help from another person bathing (including washing, rinsing, drying)?: A Little Help from another person to put on and taking off regular upper body clothing?: A Little Help from another person to put on and taking off regular lower body clothing?: A Little 6 Click Score: 19   End of Session Equipment Utilized During Treatment: Gait belt;Rolling walker (2 wheels);Oxygen (6L) Nurse Communication: Mobility status  Activity Tolerance: Patient tolerated treatment well Patient left: in bed;with call bell/phone within reach;with bed alarm set  OT Visit Diagnosis: Unsteadiness on feet (R26.81);Other abnormalities of gait and mobility (R26.89);Muscle weakness (generalized) (M62.81)                Time: 1478-2956 OT Time Calculation (min): 45 min Charges:  OT General Charges $OT Visit: 1 Visit OT Evaluation $OT Eval Moderate Complexity: 1 Mod OT Treatments $Self Care/Home Management : 8-22 mins $  Therapeutic Activity: 8-22 mins  03/11/2023  AB, OTR/L  Acute Rehabilitation Services  Office: [phone]   Tristan Schroeder 03/11/2023, 4:10 PM

## 2023-03-11 NOTE — Plan of Care (Signed)

## 2023-03-11 NOTE — TOC Progression Note (Signed)
Transition of Care [facility]) - Progression Note    Patient Details  Name: Susan Holder MRN: [id number] Date of Birth: December 25, 1955  Transition of Care [facility]) CM/SW Contact  Michaela Corner, [place] Phone Number: 03/11/2023, 4:36 PM  Clinical Narrative:   CSW met pt in room and discussed PT recs for SNF. Pt declined SNF at this time and stated she does not want to go to a skilled nursing facility. Pt asked about going to outpatient PT with [facility]. TOC will continue to follow.                       Social Determinants of Health (SDOH) Interventions SDOH Screenings   Food Insecurity: No Food Insecurity (03/09/2023)  Housing: Low Risk  (03/09/2023)  Transportation Needs: No Transportation Needs (03/09/2023)  Utilities: Not At Risk (03/09/2023)  Alcohol Screen: Low Risk  (12/08/2022)  Depression (PHQ2-9): Low Risk  (12/08/2022)  Financial Resource Strain: Low Risk  (12/08/2022)  Physical Activity: Inactive (12/08/2022)  Social Connections: Socially Isolated (12/08/2022)  Stress: Stress Concern Present (12/08/2022)  Tobacco Use: Medium Risk (03/08/2023)  Health Literacy: Adequate Health Literacy (12/08/2022)    Readmission Risk Interventions     No data to display

## 2023-03-11 NOTE — TOC Benefit Eligibility Note (Signed)
 Patient Product/process development scientist completed.    The patient is insured through [employer]. Patient has Medicare and is not eligible for a copay card, but may be able to apply for patient assistance, if available.    Ran test claim for Eliquis 5 mg and the current 30 day co-pay is $40.00.   This test claim was processed through [facility]- copay amounts may vary at other pharmacies due to pharmacy/plan contracts, or as the patient moves through the different stages of their insurance plan.     Roland Earl, CPHT Pharmacy Technician III Certified Patient Advocate [facility] Patient Advocate Team Direct Number: [phone]  Fax: [phone]

## 2023-03-11 NOTE — Progress Notes (Signed)
   03/11/23 0424 03/11/23 0500  Assess: MEWS Score  Temp 98.3 F (36.8 C) 98.2 F (36.8 C)  BP (!) 117/55 (!) 110/57  MAP (mmHg) 73 71  Pulse Rate 99 100  ECG Heart Rate 100 100  Resp (!) 27 19  SpO2 99 % 96 %  O2 Device  --  Nasal Cannula  O2 Flow Rate (L/min)  --  2 L/min  Assess: MEWS Score  MEWS Temp 0 0  MEWS Systolic 0 0  MEWS Pulse 0 0  MEWS RR 2 0  MEWS LOC 0 0  MEWS Score 2 0  MEWS Score Color Yellow Green  Assess: if the MEWS score is Yellow or Red  Were vital signs accurate and taken at a resting state? Yes  --   Does the patient meet 2 or more of the SIRS criteria? Yes  --   Does the patient have a confirmed or suspected source of infection? Yes  --   MEWS guidelines implemented   (rechecked vitals. Pt arm hurts with BP cuff. respirations increase . Checking respiration following cuff deflation improved.)  --   Assess: SIRS CRITERIA  SIRS Temperature  0 0  SIRS Pulse 1 1  SIRS Respirations  1 0  SIRS WBC 1 1  SIRS Score Sum  3 2

## 2023-03-11 NOTE — Progress Notes (Signed)
Physical Therapy Treatment Patient Details Name: Susan Holder MRN: [id number] DOB: 06-Dec-1955 Today's Date: 03/11/2023   History of Present Illness Pt is a 67 y.o. F who presents 03/08/2023 with PE and bilateral peroneal vein DVT. Significant PMH: non small cell CA, pleural effusion - Pleurx catheter in place, RA.    PT Comments  Pt progressing slowly towards her physical therapy goals; is agreeable to participate. Session focused on warm up exercises and progressive gait. Pt ambulating 12 ft x 2 with a walker and seated rest break in between bouts.HR 103-112 bpm, desat to 85% on 6L O2 (bumped up from 4L O2 at rest). Pt with significantly decreased cardiopulmonary endurance in comparison to her baseline. Would benefit from continued PT to address.     If plan is discharge home, recommend the following: A little help with walking and/or transfers;A little help with bathing/dressing/bathroom;Assistance with cooking/housework;Assist for transportation;Help with stairs or ramp for entrance   Can travel by private vehicle        Equipment Recommendations  BSC/3in1;Rolling walker (2 wheels)    Recommendations for Other Services       Precautions / Restrictions Precautions Precautions: Fall;Other (comment) Precaution Comments: watch HR/O2 Restrictions Weight Bearing Restrictions: No     Mobility  Bed Mobility Overal bed mobility: Needs Assistance Bed Mobility: Supine to Sit, Sit to Supine     Supine to sit: Supervision Sit to supine: Supervision        Transfers Overall transfer level: Needs assistance Equipment used: Rolling walker (2 wheels), None Transfers: Sit to/from Stand Sit to Stand: Contact guard assist                Ambulation/Gait Ambulation/Gait assistance: Contact guard assist Gait Distance (Feet): 24 Feet (12" 12") Assistive device: Rolling walker (2 wheels) Gait Pattern/deviations: Step-through pattern, Decreased stride length Gait velocity:  decreased Gait velocity interpretation: <1.8 ft/sec, indicate of risk for recurrent falls   General Gait Details: Slow pace,fatigues quickly   Stairs             Wheelchair Mobility     Tilt Bed    Modified Rankin (Stroke Patients Only)       Balance Overall balance assessment: Needs assistance Sitting-balance support: Feet supported Sitting balance-Leahy Scale: Fair     Standing balance support: Bilateral upper extremity supported, Reliant on assistive device for balance Standing balance-Leahy Scale: Poor                              Cognition Arousal: Alert Behavior During Therapy: Flat affect Overall Cognitive Status: Within Functional Limits for tasks assessed                                          Exercises General Exercises - Upper Extremity Shoulder Flexion: Both, 10 reps, Seated General Exercises - Lower Extremity Ankle Circles/Pumps: Both, 10 reps, Seated Long Arc Quad: Both, 10 reps, Seated Hip Flexion/Marching: Both, 5 reps, Seated    General Comments        Pertinent Vitals/Pain Pain Assessment Pain Assessment: No/denies pain    Home Living                          Prior Function            PT Goals (current goals  can now be found in the care plan section) Acute Rehab PT Goals Patient Stated Goal: get stronger PT Goal Formulation: With patient Time For Goal Achievement: 03/24/23 Potential to Achieve Goals: Fair Progress towards PT goals: Progressing toward goals    Frequency    Min 1X/week      PT Plan      Co-evaluation              AM-PAC PT "6 Clicks" Mobility   Outcome Measure  Help needed turning from your back to your side while in a flat bed without using bedrails?: A Little Help needed moving from lying on your back to sitting on the side of a flat bed without using bedrails?: A Little Help needed moving to and from a bed to a chair (including a wheelchair)?: A  Little Help needed standing up from a chair using your arms (e.g., wheelchair or bedside chair)?: A Little Help needed to walk in hospital room?: A Little Help needed climbing 3-5 steps with a railing? : Total 6 Click Score: 16    End of Session Equipment Utilized During Treatment: Gait belt;Oxygen Activity Tolerance: Patient limited by fatigue Patient left: in bed;with call bell/phone within reach Nurse Communication: Mobility status (purewick) PT Visit Diagnosis: Muscle weakness (generalized) (M62.81);Difficulty in walking, not elsewhere classified (R26.2)     Time: 1410-1453 PT Time Calculation (min) (ACUTE ONLY): 43 min  Charges:    $Therapeutic Activity: 38-52 mins PT General Charges $$ ACUTE PT VISIT: 1 Visit                     Lillia Pauls, PT, DPT Acute Rehabilitation Services Office [phone]    Norval Morton 03/11/2023, 3:51 PM

## 2023-03-11 NOTE — Progress Notes (Signed)
   03/11/23 0000 03/11/23 0018  Assess: MEWS Score  Temp 98.3 F (36.8 C) 98.1 F (36.7 C)  BP (!) 125/59 (!) 122/55  MAP (mmHg) 76 74  Pulse Rate 100 99  ECG Heart Rate (!) 101 99  Resp (!) 29 20  SpO2 97 % 98 %  O2 Device Nasal Cannula Nasal Cannula  O2 Flow Rate (L/min)  --  2 L/min  Assess: MEWS Score  MEWS Temp 0 0  MEWS Systolic 0 0  MEWS Pulse 1 0  MEWS RR 2 0  MEWS LOC 0 0  MEWS Score 3 0  MEWS Score Color Yellow Green  Assess: if the MEWS score is Yellow or Red  Were vital signs accurate and taken at a resting state? Yes  --   Does the patient meet 2 or more of the SIRS criteria? Yes  --   Does the patient have a confirmed or suspected source of infection? Yes  --   MEWS guidelines implemented   (Vitals rechecked and green pt has been yellow in last 24 hours)  --   Assess: SIRS CRITERIA  SIRS Temperature  0 0  SIRS Pulse 1 1  SIRS Respirations  1 0  SIRS WBC 0 0  SIRS Score Sum  2 1

## 2023-03-11 NOTE — Progress Notes (Signed)
PROGRESS NOTE        PATIENT DETAILS Name: Susan Holder Age: 67 y.o. Sex: female Date of Birth: [birth year] Admit Date: 03/08/2023 Admitting Physician Marinda Elk, MD YQM:VHQIONGE, Ebbie Ridge, MD  Brief Summary: Patient is a 67 y.o.  female history of non-small cell cancer-malignant pleural effusion-Pleurx catheter in place-presented with chest pain x 2 days, exertional dyspnea for the past several weeks-found to have PE and subsequently admitted to the hospitalist service.  Significant events: 11/10>> admit to [facility]  Significant studies: 11/11>> CTA chest: Pulmonary embolism-multiple nonocclusive/segmental/subsegmental-left upper lobe 11/11>> echo: EF 45-50%, global hypokinesis.  RV systolic function mildly reduced. 11/11>> bilateral lower extremity Dopplers: Bilateral peroneal vein DVT.  Significant microbiology data: None  Procedures: None  Consults: None  Subjective: No further chest pain.  Lying comfortably in bed.  Objective: Vitals: Blood pressure (!) 110/57, pulse 100, temperature 98.2 F (36.8 C), temperature source Oral, resp. rate 19, height 5\' 6"  (1.676 m), weight 68.5 kg, last menstrual period 04/28/2000, SpO2 98%.   Exam: Gen Exam:Alert awake-not in any distress HEENT:atraumatic, normocephalic Chest: B/L clear to auscultation anteriorly CVS:S1S2 regular Abdomen:soft non tender, non distended Extremities:no edema Neurology: Non focal Skin: no rash  Pertinent Labs/Radiology:    Latest Ref Rng & Units 03/11/2023    3:10 AM 03/10/2023    3:45 AM 03/08/2023    9:57 PM  CBC  WBC 4.0 - 10.5 K/uL 13.9  8.8  8.5   Hemoglobin 12.0 - 15.0 g/dL 95.2  84.1  32.4   Hematocrit 36.0 - 46.0 % 32.9  34.0  36.7   Platelets 150 - 400 K/uL 299  341  332     Lab Results  Component Value Date   NA 137 03/10/2023   K 4.0 03/10/2023   CL 100 03/10/2023   CO2 28 03/10/2023      Assessment/Plan: Pulmonary embolism with  bilateral lower extremity DVT Likely provoked by underlying malignancy Hemodynamically stable-no clinical signs of RV failure. Discussed with Dr. Maia Petties subsequently transition to Eliquis Extensive risk/benefits discussion done with patient-she does have known malignancy and brain mets-but otherwise no obvious contraindication to anticoagulation.  She was okay with being on blood thinners at this point.    Elevated troponin levels Likely due to PE No further chest pain Echo essentially the same as in 2022 Do not think she is a good candidate for further cardiac workup-given her advanced malignancy.  Stage IV adenocarcinoma Malignant pleural effusion Currently on observation-followed by Dr. Arbutus Ped Pleurex catheter in place-continue to drain every 2-3 days.  Acute on chronic hypoxic respiratory failure Secondary to PE Usually on 2 L-was on 4 L overnight-has been transitioned back to 2 L  Rheumatoid arthritis Stable Plaquenil  COPD Stable Continue bronchodilators  Normocytic anemia Secondary to malignancy Stable for close periodic CBC monitoring  Debility/deconditioning Evaluate PT/OT-CIR recommended-patient really not keen on being away from family for several weeks-she wants to see how she does with PT/OT today and then decide on whether she wants to pursue CIR versus SNF versus home health. Will discuss with TOC team.  BMI: Estimated body mass index is 24.37 kg/m as calculated from the following:   Height as of this encounter: 5\' 6"  (1.676 m).   Weight as of this encounter: 68.5 kg.   Code status:   Code Status: Full Code   DVT Prophylaxis:  IV heparin>> Eliquis    Family Communication: Amedeo Gory Colston-[phone] updated over the phone on 11/12   Disposition Plan: Status is: Inpatient Remains inpatient appropriate because: Severity of illness   Planned Discharge Destination:Home health vs CIR   Diet: Diet Order             Diet Heart  Room service appropriate? Yes; Fluid consistency: Thin  Diet effective now                     Antimicrobial agents: Anti-infectives (From admission, onward)    Start     Dose/Rate Route Frequency Ordered Stop   03/09/23 1000  hydroxychloroquine (PLAQUENIL) tablet 200 mg        200 mg Oral Daily 03/09/23 0529          MEDICATIONS: Scheduled Meds:  apixaban  5 mg Oral BID   arformoterol  15 mcg Nebulization BID   And   umeclidinium bromide  1 puff Inhalation Daily   hydroxychloroquine  200 mg Oral Daily   pantoprazole  40 mg Oral Daily   Continuous Infusions:   PRN Meds:.acetaminophen, albuterol, ondansetron **OR** ondansetron (ZOFRAN) IV, mouth rinse   I have personally reviewed following labs and imaging studies  LABORATORY DATA: CBC: Recent Labs  Lab 03/08/23 2157 03/10/23 0345 03/11/23 0310  WBC 8.5 8.8 13.9*  HGB 11.1* 10.4* 10.2*  HCT 36.7 34.0* 32.9*  MCV 97.6 97.1 95.6  PLT 332 341 299    Basic Metabolic Panel: Recent Labs  Lab 03/08/23 2157 03/10/23 0345  NA 138 137  K 3.8 4.0  CL 101 100  CO2 27 28  GLUCOSE 108* 96  BUN 15 15  CREATININE 0.98 1.23*  CALCIUM 9.7 10.0    GFR: Estimated Creatinine Clearance: 40.4 mL/min (A) (by C-G formula based on SCr of 1.23 mg/dL (H)).  Liver Function Tests: No results for input(s): "AST", "ALT", "ALKPHOS", "BILITOT", "PROT", "ALBUMIN" in the last 168 hours. No results for input(s): "LIPASE", "AMYLASE" in the last 168 hours. No results for input(s): "AMMONIA" in the last 168 hours.  Coagulation Profile: No results for input(s): "INR", "PROTIME" in the last 168 hours.  Cardiac Enzymes: No results for input(s): "CKTOTAL", "CKMB", "CKMBINDEX", "TROPONINI" in the last 168 hours.  BNP (last 3 results) No results for input(s): "PROBNP" in the last 8760 hours.  Lipid Profile: No results for input(s): "CHOL", "HDL", "LDLCALC", "TRIG", "CHOLHDL", "LDLDIRECT" in the last 72 hours.  Thyroid Function  Tests: No results for input(s): "TSH", "T4TOTAL", "FREET4", "T3FREE", "THYROIDAB" in the last 72 hours.  Anemia Panel: No results for input(s): "VITAMINB12", "FOLATE", "FERRITIN", "TIBC", "IRON", "RETICCTPCT" in the last 72 hours.  Urine analysis:    Component Value Date/Time   COLORURINE STRAW (A) 10/30/2021 1520   APPEARANCEUR CLEAR 10/30/2021 1520   LABSPEC 1.008 10/30/2021 1520   PHURINE 5.0 10/30/2021 1520   GLUCOSEU NEGATIVE 10/30/2021 1520   HGBUR NEGATIVE 10/30/2021 1520   BILIRUBINUR NEGATIVE 10/30/2021 1520   KETONESUR NEGATIVE 10/30/2021 1520   PROTEINUR NEGATIVE 10/30/2021 1520   NITRITE NEGATIVE 10/30/2021 1520   LEUKOCYTESUR SMALL (A) 10/30/2021 1520    Sepsis Labs: Lactic Acid, Venous No results found for: "LATICACIDVEN"  MICROBIOLOGY: No results found for this or any previous visit (from the past 240 hour(s)).  RADIOLOGY STUDIES/RESULTS: VAS [place] LOWER EXTREMITY VENOUS (DVT)  Result Date: 03/09/2023  Lower Venous DVT Study Patient Name:  Susan Holder  Date of Exam:   03/09/2023 Medical Rec #: [id number]  Accession #:    [id number] Date of Birth: [birth year]           Patient Gender: F Patient Age:   50 years Exam Location:  [facility] Procedure:      VAS [place] LOWER EXTREMITY VENOUS (DVT) Referring Phys: Jilda Panda GARDNER --------------------------------------------------------------------------------  Indications: Pulmonary embolism, SOB, and chest pain.  Risk Factors: Confirmed PE. Comparison Study: No prior study. Performing Technologist: Fernande Bras  Examination Guidelines: A complete evaluation includes B-mode imaging, spectral Doppler, color Doppler, and power Doppler as needed of all accessible portions of each vessel. Bilateral testing is considered an integral part of a complete examination. Limited examinations for reoccurring indications may be performed as noted. The reflux portion of the exam is performed with the patient in reverse  Trendelenburg.  +---------+---------------+---------+-----------+----------+--------------+ RIGHT    CompressibilityPhasicitySpontaneityPropertiesThrombus Aging +---------+---------------+---------+-----------+----------+--------------+ CFV      Full           Yes      Yes                                 +---------+---------------+---------+-----------+----------+--------------+ SFJ      Full                                                        +---------+---------------+---------+-----------+----------+--------------+ FV Prox  Full                                                        +---------+---------------+---------+-----------+----------+--------------+ FV Mid   Full                                                        +---------+---------------+---------+-----------+----------+--------------+ FV DistalFull                                                        +---------+---------------+---------+-----------+----------+--------------+ PFV      Full                                                        +---------+---------------+---------+-----------+----------+--------------+ POP      Full           Yes      Yes                                 +---------+---------------+---------+-----------+----------+--------------+ PTV      Full                                                        +---------+---------------+---------+-----------+----------+--------------+  PERO     None           No       No                                  +---------+---------------+---------+-----------+----------+--------------+   +---------+---------------+---------+-----------+----------+--------------+ LEFT     CompressibilityPhasicitySpontaneityPropertiesThrombus Aging +---------+---------------+---------+-----------+----------+--------------+ CFV      Full           Yes      Yes                                  +---------+---------------+---------+-----------+----------+--------------+ SFJ      Full                                                        +---------+---------------+---------+-----------+----------+--------------+ FV Prox  Full                                                        +---------+---------------+---------+-----------+----------+--------------+ FV Mid   Full                                                        +---------+---------------+---------+-----------+----------+--------------+ FV DistalFull                                                        +---------+---------------+---------+-----------+----------+--------------+ PFV      Full                                                        +---------+---------------+---------+-----------+----------+--------------+ POP      Full           Yes      Yes                                 +---------+---------------+---------+-----------+----------+--------------+ PTV      Partial        No       No                                  +---------+---------------+---------+-----------+----------+--------------+ PERO     Full                                                        +---------+---------------+---------+-----------+----------+--------------+  Summary: BILATERAL: -No evidence of popliteal cyst, bilaterally. RIGHT: - Findings consistent with acute deep vein thrombosis involving the right peroneal veins.   LEFT: - Findings consistent with acute deep vein thrombosis involving the left posterior tibial veins.   *See table(s) above for measurements and observations. Electronically signed by Gerarda Fraction on 03/09/2023 at 6:13:19 PM.    Final      LOS: 2 days   Jeoffrey Massed, MD  [facility]    To contact the attending provider between 7A-7P or the covering provider during after hours 7P-7A, please log into the web site [email or web address] and access using universal [place]  password for that web site. If you do not have the password, please call the hospital operator.  03/11/2023, 9:54 AM

## 2023-03-11 NOTE — Discharge Instructions (Addendum)
Information on my medicine - ELIQUIS (apixaban)  This medication education was reviewed with me or my healthcare representative as part of my discharge preparation.   Why was Eliquis prescribed for you? Eliquis was prescribed to treat blood clots that may have been found in the veins of your legs (deep vein thrombosis) or in your lungs (pulmonary embolism) and to reduce the risk of them occurring again.  What do You need to know about Eliquis ? The dose is 5 mg tablet taken TWICE daily.  Eliquis may be taken with or without food.   Try to take the dose about the same time in the morning and in the evening. If you have difficulty swallowing the tablet whole please discuss with your pharmacist how to take the medication safely.  Take Eliquis exactly as prescribed and DO NOT stop taking Eliquis without talking to the doctor who prescribed the medication.  Stopping may increase your risk of developing a new blood clot.  Refill your prescription before you run out.  After discharge, you should have regular check-up appointments with your healthcare provider that is prescribing your Eliquis.    What do you do if you miss a dose? If a dose of ELIQUIS is not taken at the scheduled time, take it as soon as possible on the same day and twice-daily administration should be resumed. The dose should not be doubled to make up for a missed dose.  Important Safety Information A possible side effect of Eliquis is bleeding. You should call your healthcare provider right away if you experience any of the following: Bleeding from an injury or your nose that does not stop. Unusual colored urine (red or dark brown) or unusual colored stools (red or black). Unusual bruising for unknown reasons. A serious fall or if you hit your head (even if there is no bleeding).  Some medicines may interact with Eliquis and might increase your risk of bleeding or clotting while on Eliquis. To help avoid this,  consult your healthcare provider or pharmacist prior to using any new prescription or non-prescription medications, including herbals, vitamins, non-steroidal anti-inflammatory drugs (NSAIDs) and supplements.  This website has more information on Eliquis (apixaban): http://[email or web address].com/eliquis/home  

## 2023-03-12 ENCOUNTER — Encounter: Payer: Self-pay | Admitting: Radiation Oncology

## 2023-03-12 DIAGNOSIS — J9621 Acute and chronic respiratory failure with hypoxia: Secondary | ICD-10-CM | POA: Diagnosis not present

## 2023-03-12 DIAGNOSIS — R2681 Unsteadiness on feet: Secondary | ICD-10-CM | POA: Diagnosis not present

## 2023-03-12 DIAGNOSIS — I2699 Other pulmonary embolism without acute cor pulmonale: Secondary | ICD-10-CM | POA: Diagnosis not present

## 2023-03-12 DIAGNOSIS — C3491 Malignant neoplasm of unspecified part of right bronchus or lung: Secondary | ICD-10-CM | POA: Diagnosis not present

## 2023-03-12 DIAGNOSIS — J910 Malignant pleural effusion: Secondary | ICD-10-CM | POA: Diagnosis not present

## 2023-03-12 DIAGNOSIS — J9611 Chronic respiratory failure with hypoxia: Secondary | ICD-10-CM | POA: Diagnosis not present

## 2023-03-12 DIAGNOSIS — M6281 Muscle weakness (generalized): Secondary | ICD-10-CM | POA: Diagnosis not present

## 2023-03-12 DIAGNOSIS — D649 Anemia, unspecified: Secondary | ICD-10-CM | POA: Diagnosis not present

## 2023-03-12 DIAGNOSIS — I824Z3 Acute embolism and thrombosis of unspecified deep veins of distal lower extremity, bilateral: Secondary | ICD-10-CM | POA: Diagnosis not present

## 2023-03-12 DIAGNOSIS — R609 Edema, unspecified: Secondary | ICD-10-CM | POA: Diagnosis not present

## 2023-03-12 DIAGNOSIS — J9601 Acute respiratory failure with hypoxia: Secondary | ICD-10-CM | POA: Diagnosis not present

## 2023-03-12 DIAGNOSIS — I2609 Other pulmonary embolism with acute cor pulmonale: Secondary | ICD-10-CM | POA: Diagnosis not present

## 2023-03-12 DIAGNOSIS — J449 Chronic obstructive pulmonary disease, unspecified: Secondary | ICD-10-CM | POA: Diagnosis not present

## 2023-03-12 DIAGNOSIS — R7989 Other specified abnormal findings of blood chemistry: Secondary | ICD-10-CM | POA: Diagnosis not present

## 2023-03-12 DIAGNOSIS — R262 Difficulty in walking, not elsewhere classified: Secondary | ICD-10-CM | POA: Diagnosis not present

## 2023-03-12 DIAGNOSIS — R1311 Dysphagia, oral phase: Secondary | ICD-10-CM | POA: Diagnosis not present

## 2023-03-12 MED ORDER — ALBUTEROL SULFATE (2.5 MG/3ML) 0.083% IN NEBU: 2.5000 mg | INHALATION_SOLUTION | RESPIRATORY_TRACT | Status: DC | PRN

## 2023-03-12 MED ORDER — APIXABAN 5 MG PO TABS: 5.0000 mg | ORAL_TABLET | Freq: Two times a day (BID) | ORAL | Status: DC

## 2023-03-12 NOTE — Progress Notes (Signed)
Occupational Therapy Treatment Patient Details Name: Susan Holder MRN: [id number] DOB: 1955-07-04 Today's Date: 03/12/2023   History of present illness Pt is a 67 y.o. F who presents 03/08/2023 with PE and bilateral peroneal vein DVT. Significant PMH: non small cell CA, pleural effusion - Pleurx catheter in place, RA.   OT comments  Pt progressing slowly, remains limited by increased Sp02 demands and decreased activity tolerance. Pt was able to stand for brief periods of time to brush her teeth but continues to need seated rest breaks to recover Sp02. Pt Tovey replaced with a HFNC during ambulation as her 02 was slow to recover, significant increase in Sp02 recover time once changing out cannulas. OT to continue to progress pt as able, DC plans remain appropriate for SNF at this time.       If plan is discharge home, recommend the following:  A little help with walking and/or transfers;A little help with bathing/dressing/bathroom;Other (comment) (CGA due to fatigue)   Equipment Recommendations  BSC/3in1;Other (comment) (RW)    Recommendations for Other Services      Precautions / Restrictions Precautions Precautions: Fall;Other (comment) Precaution Comments: watch HR/O2 Restrictions Weight Bearing Restrictions: No       Mobility Bed Mobility               General bed mobility comments: Up in recliner on arrival    Transfers Overall transfer level: Needs assistance Equipment used: Rolling walker (2 wheels) Transfers: Sit to/from Stand Sit to Stand: Contact guard assist           General transfer comment: STSx2 CGA with cues for hand placement, ambulated ~16ft to sink     Balance Overall balance assessment: Needs assistance Sitting-balance support: Feet supported Sitting balance-Leahy Scale: Fair     Standing balance support: Bilateral upper extremity supported, Reliant on assistive device for balance Standing balance-Leahy Scale: Poor Standing balance  comment: Reliant on RW                           ADL either performed or assessed with clinical judgement   ADL Overall ADL's : Needs assistance/impaired     Grooming: Sitting;Set up;Contact guard assist;Standing Grooming Details (indicate cue type and reason): Pt needing rest breaks to recover Sp02 while brushing teeth, transitioning to standing for brief periods of time                               General ADL Comments: Pt ambulated to sink, continued to desat throughout OT session    Extremity/Trunk Assessment Upper Extremity Assessment Upper Extremity Assessment: Generalized weakness   Lower Extremity Assessment Lower Extremity Assessment: Generalized weakness        Vision       Perception     Praxis      Cognition Arousal: Alert Behavior During Therapy: WFL for tasks assessed/performed Overall Cognitive Status: Within Functional Limits for tasks assessed                                          Exercises      Shoulder Instructions       General Comments Pt desat to 79% while on 4L ambulating, unable to fully recover. Seated rest break and 6L she was able to get to 96% Sp02, bumped back  down to 4L to brush teeth and ambulate back to recliner with Sp02 at 94%. Unable to stand much longer to review breathing techniques as she would desat    Pertinent Vitals/ Pain       Pain Assessment Pain Assessment: No/denies pain  Home Living                                          Prior Functioning/Environment              Frequency  Min 1X/week        Progress Toward Goals  OT Goals(current goals can now be found in the care plan section)  Progress towards OT goals: Progressing toward goals  Acute Rehab OT Goals Patient Stated Goal: To get better/stronger OT Goal Formulation: With patient Time For Goal Achievement: 03/25/23 Potential to Achieve Goals: Good  Plan      Co-evaluation                  AM-PAC OT "6 Clicks" Daily Activity     Outcome Measure   Help from another person eating meals?: None Help from another person taking care of personal grooming?: A Little Help from another person toileting, which includes using toliet, bedpan, or urinal?: A Little Help from another person bathing (including washing, rinsing, drying)?: A Little Help from another person to put on and taking off regular upper body clothing?: A Little Help from another person to put on and taking off regular lower body clothing?: A Little 6 Click Score: 19    End of Session Equipment Utilized During Treatment: Gait belt;Rolling walker (2 wheels);Oxygen (4-6L)  OT Visit Diagnosis: Unsteadiness on feet (R26.81);Other abnormalities of gait and mobility (R26.89);Muscle weakness (generalized) (M62.81)   Activity Tolerance Patient tolerated treatment well   Patient Left in bed;with call bell/phone within reach;with bed alarm set   Nurse Communication Mobility status        Time: 1610-9604 OT Time Calculation (min): 43 min  Charges: OT General Charges $OT Visit: 1 Visit OT Treatments $Self Care/Home Management : 8-22 mins $Therapeutic Activity: 23-37 mins  03/12/2023  AB, OTR/L  Acute Rehabilitation Services  Office: [phone]   Susan Holder 03/12/2023, 11:41 AM

## 2023-03-12 NOTE — Plan of Care (Signed)
  Problem: Education: Goal: Knowledge of General Education information will improve Description: Including pain rating scale, medication(s)/side effects and non-pharmacologic comfort measures 03/12/2023 1346 by Rosalio Macadamia, RN Outcome: Adequate for Discharge 03/12/2023 1021 by Rosalio Macadamia, RN Outcome: Progressing   Problem: Health Behavior/Discharge Planning: Goal: Ability to manage health-related needs will improve 03/12/2023 1346 by Rosalio Macadamia, RN Outcome: Adequate for Discharge 03/12/2023 1021 by Rosalio Macadamia, RN Outcome: Progressing   Problem: Clinical Measurements: Goal: Ability to maintain clinical measurements within normal limits will improve 03/12/2023 1346 by Rosalio Macadamia, RN Outcome: Adequate for Discharge 03/12/2023 1021 by Rosalio Macadamia, RN Outcome: Progressing Goal: Will remain free from infection 03/12/2023 1346 by Rosalio Macadamia, RN Outcome: Adequate for Discharge 03/12/2023 1021 by Rosalio Macadamia, RN Outcome: Progressing Goal: Diagnostic test results will improve 03/12/2023 1346 by Rosalio Macadamia, RN Outcome: Adequate for Discharge 03/12/2023 1021 by Rosalio Macadamia, RN Outcome: Progressing Goal: Respiratory complications will improve 03/12/2023 1346 by Rosalio Macadamia, RN Outcome: Adequate for Discharge 03/12/2023 1021 by Rosalio Macadamia, RN Outcome: Progressing Goal: Cardiovascular complication will be avoided 03/12/2023 1346 by Rosalio Macadamia, RN Outcome: Adequate for Discharge 03/12/2023 1021 by Rosalio Macadamia, RN Outcome: Progressing   Problem: Activity: Goal: Risk for activity intolerance will decrease 03/12/2023 1346 by Rosalio Macadamia, RN Outcome: Adequate for Discharge 03/12/2023 1021 by Rosalio Macadamia, RN Outcome: Progressing   Problem: Nutrition: Goal: Adequate nutrition will be maintained 03/12/2023 1346 by Rosalio Macadamia, RN Outcome: Adequate for Discharge 03/12/2023 1021 by Rosalio Macadamia, RN Outcome:  Progressing   Problem: Coping: Goal: Level of anxiety will decrease 03/12/2023 1346 by Rosalio Macadamia, RN Outcome: Adequate for Discharge 03/12/2023 1021 by Rosalio Macadamia, RN Outcome: Progressing   Problem: Elimination: Goal: Will not experience complications related to bowel motility 03/12/2023 1346 by Rosalio Macadamia, RN Outcome: Adequate for Discharge 03/12/2023 1021 by Rosalio Macadamia, RN Outcome: Progressing Goal: Will not experience complications related to urinary retention 03/12/2023 1346 by Rosalio Macadamia, RN Outcome: Adequate for Discharge 03/12/2023 1021 by Rosalio Macadamia, RN Outcome: Progressing   Problem: Pain Management: Goal: General experience of comfort will improve 03/12/2023 1346 by Rosalio Macadamia, RN Outcome: Adequate for Discharge 03/12/2023 1021 by Rosalio Macadamia, RN Outcome: Progressing   Problem: Safety: Goal: Ability to remain free from injury will improve 03/12/2023 1346 by Rosalio Macadamia, RN Outcome: Adequate for Discharge 03/12/2023 1021 by Rosalio Macadamia, RN Outcome: Progressing   Problem: Skin Integrity: Goal: Risk for impaired skin integrity will decrease 03/12/2023 1346 by Rosalio Macadamia, RN Outcome: Adequate for Discharge 03/12/2023 1021 by Rosalio Macadamia, RN Outcome: Progressing

## 2023-03-12 NOTE — Progress Notes (Signed)
PROGRESS NOTE        PATIENT DETAILS Name: Susan Holder Age: 67 y.o. Sex: female Date of Birth: [birth year] Admit Date: 03/08/2023 Admitting Physician Marinda Elk, MD NWG:[id number], Ebbie Ridge, MD  Brief Summary: Patient is a 67 y.o.  female history of non-small cell cancer-malignant pleural effusion-Pleurx catheter in place-presented with chest pain x 2 days, exertional dyspnea for the past several weeks-found to have PE and subsequently admitted to the hospitalist service.  Significant events: 11/10>> admit to [facility]  Significant studies: 11/11>> CTA chest: Pulmonary embolism-multiple nonocclusive/segmental/subsegmental-left upper lobe 11/11>> echo: EF 45-50%, global hypokinesis.  RV systolic function mildly reduced. 11/11>> bilateral lower extremity Dopplers: Bilateral peroneal vein DVT.  Significant microbiology data: None  Procedures: None  Consults: None  Subjective: No major issues overnight-feels weak/tired-reluctantly agreeable to go to SNF.  Objective: Vitals: Blood pressure (!) 113/54, pulse 94, temperature 98.7 F (37.1 C), temperature source Oral, resp. rate 18, height 5\' 6"  (1.676 m), weight 68.5 kg, last menstrual period 04/28/2000, SpO2 95%.   Exam: Gen Exam:Alert awake-not in any distress HEENT:atraumatic, normocephalic Chest: B/L clear to auscultation anteriorly CVS:S1S2 regular Abdomen:soft non tender, non distended Extremities:no edema Neurology: Non focal Skin: no rash  Pertinent Labs/Radiology:    Latest Ref Rng & Units 03/11/2023    3:10 AM 03/10/2023    3:45 AM 03/08/2023    9:57 PM  CBC  WBC 4.0 - 10.5 K/uL 13.9  8.8  8.5   Hemoglobin 12.0 - 15.0 g/dL 65.7  84.6  96.2   Hematocrit 36.0 - 46.0 % 32.9  34.0  36.7   Platelets 150 - 400 K/uL 299  341  332     Lab Results  Component Value Date   NA 137 03/10/2023   K 4.0 03/10/2023   CL 100 03/10/2023   CO2 28 03/10/2023       Assessment/Plan: Pulmonary embolism with bilateral lower extremity DVT Likely provoked by underlying malignancy Hemodynamically stable-no clinical signs of RV failure. Discussed with Dr. Maia Petties subsequently transition to Eliquis Extensive risk/benefits discussion done with patient-she does have known malignancy and brain mets-but otherwise no obvious contraindication to anticoagulation.  She was okay with being on blood thinners at this point.    Elevated troponin levels Likely due to PE No further chest pain Echo essentially the same as in 2022 Do not think she is a good candidate for further cardiac workup-given her advanced malignancy.  Stage IV adenocarcinoma Malignant pleural effusion Currently on observation-followed by Dr. Arbutus Ped Pleurex catheter in place-continue to drain every 2-3 days.  Acute on chronic hypoxic respiratory failure Secondary to PE Back on usual 2-3 L of oxygen.  Rheumatoid arthritis Stable Plaquenil  COPD Stable Continue bronchodilators  Normocytic anemia Secondary to malignancy Stable for close periodic CBC monitoring  Debility/deconditioning Initially very reluctant to go to CIR or SNF-after extensive discussion-she is agreeable for SNF today-discussed with Child psychotherapist.  BMI: Estimated body mass index is 24.37 kg/m as calculated from the following:   Height as of this encounter: 5\' 6"  (1.676 m).   Weight as of this encounter: 68.5 kg.   Code status:   Code Status: Full Code   DVT Prophylaxis: IV heparin>> Eliquis    Family Communication: Amedeo Gory [place]-[phone] updated over the phone on 11/12   Disposition Plan: Status is: Inpatient Remains inpatient appropriate because: Severity of  illness   Planned Discharge Destination:Home health vs CIR   Diet: Diet Order             Diet Heart Room service appropriate? Yes; Fluid consistency: Thin  Diet effective now                      Antimicrobial agents: Anti-infectives (From admission, onward)    Start     Dose/Rate Route Frequency Ordered Stop   03/09/23 1000  hydroxychloroquine (PLAQUENIL) tablet 200 mg        200 mg Oral Daily 03/09/23 0529          MEDICATIONS: Scheduled Meds:  apixaban  5 mg Oral BID   arformoterol  15 mcg Nebulization BID   And   umeclidinium bromide  1 puff Inhalation Daily   hydroxychloroquine  200 mg Oral Daily   pantoprazole  40 mg Oral Daily   Continuous Infusions:   PRN Meds:.acetaminophen, albuterol, ondansetron **OR** ondansetron (ZOFRAN) IV, mouth rinse   I have personally reviewed following labs and imaging studies  LABORATORY DATA: CBC: Recent Labs  Lab 03/08/23 2157 03/10/23 0345 03/11/23 0310  WBC 8.5 8.8 13.9*  HGB 11.1* 10.4* 10.2*  HCT 36.7 34.0* 32.9*  MCV 97.6 97.1 95.6  PLT 332 341 299    Basic Metabolic Panel: Recent Labs  Lab 03/08/23 2157 03/10/23 0345  NA 138 137  K 3.8 4.0  CL 101 100  CO2 27 28  GLUCOSE 108* 96  BUN 15 15  CREATININE 0.98 1.23*  CALCIUM 9.7 10.0    GFR: Estimated Creatinine Clearance: 40.4 mL/min (A) (by C-G formula based on SCr of 1.23 mg/dL (H)).  Liver Function Tests: No results for input(s): "AST", "ALT", "ALKPHOS", "BILITOT", "PROT", "ALBUMIN" in the last 168 hours. No results for input(s): "LIPASE", "AMYLASE" in the last 168 hours. No results for input(s): "AMMONIA" in the last 168 hours.  Coagulation Profile: No results for input(s): "INR", "PROTIME" in the last 168 hours.  Cardiac Enzymes: No results for input(s): "CKTOTAL", "CKMB", "CKMBINDEX", "TROPONINI" in the last 168 hours.  BNP (last 3 results) No results for input(s): "PROBNP" in the last 8760 hours.  Lipid Profile: No results for input(s): "CHOL", "HDL", "LDLCALC", "TRIG", "CHOLHDL", "LDLDIRECT" in the last 72 hours.  Thyroid Function Tests: No results for input(s): "TSH", "T4TOTAL", "FREET4", "T3FREE", "THYROIDAB" in the last 72  hours.  Anemia Panel: No results for input(s): "VITAMINB12", "FOLATE", "FERRITIN", "TIBC", "IRON", "RETICCTPCT" in the last 72 hours.  Urine analysis:    Component Value Date/Time   COLORURINE STRAW (A) 10/30/2021 1520   APPEARANCEUR CLEAR 10/30/2021 1520   LABSPEC 1.008 10/30/2021 1520   PHURINE 5.0 10/30/2021 1520   GLUCOSEU NEGATIVE 10/30/2021 1520   HGBUR NEGATIVE 10/30/2021 1520   BILIRUBINUR NEGATIVE 10/30/2021 1520   KETONESUR NEGATIVE 10/30/2021 1520   PROTEINUR NEGATIVE 10/30/2021 1520   NITRITE NEGATIVE 10/30/2021 1520   LEUKOCYTESUR SMALL (A) 10/30/2021 1520    Sepsis Labs: Lactic Acid, Venous No results found for: "LATICACIDVEN"  MICROBIOLOGY: No results found for this or any previous visit (from the past 240 hour(s)).  RADIOLOGY STUDIES/RESULTS: No results found.   LOS: 3 days   Jeoffrey Massed, MD  [facility]    To contact the attending provider between 7A-7P or the covering provider during after hours 7P-7A, please log into the web site [email or web address] and access using universal [place] password for that web site. If you do not have the password, please call the  hospital operator.  03/12/2023, 10:21 AM

## 2023-03-12 NOTE — Progress Notes (Addendum)
This Clinical research associate, primary RN Leavy Cella, attempted to call [facility], [phone], with no success. This Clinical research associate will make another attempt to contact facility shortly.   Update at 1355: This Clinical research associate, primary RN Rozalyn Osland, gave report over the phone to ADON Olu, for pt going to room 505.   AVS reviewed with pt. All questions answered at bedside, with pt verbalizing understanding. Pt will leave via private vehicle with family member. Pt left unit in NAD.

## 2023-03-12 NOTE — TOC Transition Note (Signed)
Transition of Care [facility]) - CM/SW Discharge Note   Patient Details  Name: Susan Holder MRN: [id number] Date of Birth: 1956/04/22  Transition of Care [facility]) CM/SW Contact:  Michaela Corner, LCSWA Phone Number: 03/12/2023, 1:04 PM   Clinical Narrative:    Patient will DC to: [facility] Anticipated DC date: 03/12/2023 Family notified: Cousin Transport by: Family member   Per MD patient ready for DC to [facility]. RN to call report prior to discharge [phone]). RN, patient, patient's family, and facility notified of DC. Discharge Summary and FL2 sent to facility. DC packet on chart. Pts family member will provide transportation to SNF.   CSW will sign off for now as social work intervention is no longer needed. Please consult [place] again if new needs arise.      Final next level of care: Skilled Nursing Facility Barriers to Discharge: Barriers Resolved   Patient Goals and CMS Choice CMS Medicare.gov Compare Post Acute Care list provided to:: Patient Choice offered to / list presented to : Patient  Discharge Placement                Patient chooses bed at: [facility] Patient to be transferred to facility by: Cousin Name of family member notified: Pt notified family member Patient and family notified of of transfer: 03/12/23  Discharge Plan and Services Additional resources added to the After Visit Summary for   In-house Referral: Clinical Social Work                                   Social Determinants of Health (SDOH) Interventions SDOH Screenings   Food Insecurity: No Food Insecurity (03/09/2023)  Housing: Low Risk  (03/09/2023)  Transportation Needs: No Transportation Needs (03/09/2023)  Utilities: Not At Risk (03/09/2023)  Alcohol Screen: Low Risk  (12/08/2022)  Depression (PHQ2-9): Low Risk  (12/08/2022)  Financial Resource Strain: Low Risk  (12/08/2022)  Physical Activity: Inactive (12/08/2022)  Social Connections: Socially  Isolated (12/08/2022)  Stress: Stress Concern Present (12/08/2022)  Tobacco Use: Medium Risk (03/08/2023)  Health Literacy: Adequate Health Literacy (12/08/2022)     Readmission Risk Interventions     No data to display

## 2023-03-12 NOTE — Plan of Care (Signed)
Pt is alert and oriented x 4. Up with 1 assist. Pt treating. Vitals stable. Oxygen at 3L/[place] pt requests to assist with shortness of breath after walking with PT on dayshift.  Problem: Education: Goal: Knowledge of General Education information will improve Description: Including pain rating scale, medication(s)/side effects and non-pharmacologic comfort measures Outcome: Progressing   Problem: Health Behavior/Discharge Planning: Goal: Ability to manage health-related needs will improve Outcome: Progressing   Problem: Clinical Measurements: Goal: Ability to maintain clinical measurements within normal limits will improve Outcome: Progressing Goal: Will remain free from infection Outcome: Progressing Goal: Diagnostic test results will improve Outcome: Progressing Goal: Respiratory complications will improve Outcome: Progressing Goal: Cardiovascular complication will be avoided Outcome: Progressing   Problem: Activity: Goal: Risk for activity intolerance will decrease Outcome: Progressing   Problem: Nutrition: Goal: Adequate nutrition will be maintained Outcome: Progressing   Problem: Coping: Goal: Level of anxiety will decrease Outcome: Progressing   Problem: Elimination: Goal: Will not experience complications related to bowel motility Outcome: Progressing Goal: Will not experience complications related to urinary retention Outcome: Progressing   Problem: Pain Management: Goal: General experience of comfort will improve Outcome: Progressing   Problem: Safety: Goal: Ability to remain free from injury will improve Outcome: Progressing   Problem: Skin Integrity: Goal: Risk for impaired skin integrity will decrease Outcome: Progressing

## 2023-03-12 NOTE — TOC Progression Note (Addendum)
Transition of Care [facility]) - Progression Note    Patient Details  Name: Susan Holder MRN: [id number] Date of Birth: [birth year]  [facility]) CM/SW Contact  Michaela Corner, [place] Phone Number: 03/12/2023, 11:52 AM  Clinical Narrative:   CSW met with pt about SNF placement. Pt chose [facility]. CSW explained insurance auth process and that asked pt about transportation needs; pt explained she could have her cousin come get her. Approved [facility]: [id number] effective 11/14 -11/18. Will go by Ptar being that she needs O2 and does not have any at home to be brought to her for ride to SNF.    12:58PM: CSW and SW Intern spoke with pt about dc to [facility]. Pt expressed she wants her cousin to provide transportation and called them while CSW and Intern were in the room. Cousin can pick up pt around 1:30PM, nurse has been notified. CSW asked pt if she has O2 at home that her cousin can bring for ride to SNF. Pt said yes and cousin will bring O2 tank for ride to [facility].   Expected Discharge Plan: Skilled Nursing Facility Barriers to Discharge: Continued Medical Work up, English as a second language teacher  Expected Discharge Plan and Services In-house Referral: Clinical Social Work     Living arrangements for the past 2 months: Apartment                                       Social Determinants of Health (SDOH) Interventions SDOH Screenings   Food Insecurity: No Food Insecurity (03/09/2023)  Housing: Low Risk  (03/09/2023)  Transportation Needs: No Transportation Needs (03/09/2023)  Utilities: Not At Risk (03/09/2023)  Alcohol Screen: Low Risk  (12/08/2022)  Depression (PHQ2-9): Low Risk  (12/08/2022)  Financial Resource Strain: Low Risk  (12/08/2022)  Physical Activity: Inactive (12/08/2022)  Social Connections: Socially Isolated (12/08/2022)  Stress: Stress Concern Present (12/08/2022)  Tobacco Use: Medium Risk (03/08/2023)  Health Literacy: Adequate Health Literacy  (12/08/2022)    Readmission Risk Interventions     No data to display

## 2023-03-12 NOTE — Discharge Summary (Signed)
PATIENT DETAILS Name: Susan Holder Age: 67 y.o. Sex: female Date of Birth: [birth year] MRN: [id number]. Admitting Physician: Marinda Elk, MD VZD:GLOVFIEP, Ebbie Ridge, MD  Admit Date: 03/08/2023 Discharge date: 03/12/2023  Recommendations for Outpatient Follow-up:  Follow up with PCP in 1-2 weeks Please obtain CMP/CBC in one week Please ensure follow-up with oncology   Admitted From:  Home  Disposition: Skilled nursing facility   Discharge Condition: good  CODE STATUS:   Code Status: Full Code   Diet recommendation:  Diet Order             Diet general           Diet Heart Room service appropriate? Yes; Fluid consistency: Thin  Diet effective now                    Brief Summary: Patient is a 67 y.o.  female history of non-small cell cancer-malignant pleural effusion-Pleurx catheter in place-presented with chest pain x 2 days, exertional dyspnea for the past several weeks-found to have PE and subsequently admitted to the hospitalist service.   Significant events: 11/10>> admit to [facility]   Significant studies: 11/11>> CTA chest: Pulmonary embolism-multiple nonocclusive/segmental/subsegmental-left upper lobe 11/11>> echo: EF 45-50%, global hypokinesis.  RV systolic function mildly reduced. 11/11>> bilateral lower extremity Dopplers: Bilateral peroneal vein DVT.   Significant microbiology data: None   Procedures: None   Consults: None  Brief Hospital Course: Pulmonary embolism with bilateral lower extremity DVT Likely provoked by underlying malignancy Hemodynamically stable-no clinical signs of RV failure. Discussed with Dr. Maia Petties subsequently transition to Eliquis Extensive risk/benefits discussion done with patient-she does have known malignancy and brain mets-but otherwise no obvious contraindication to anticoagulation.  She was okay with being on blood thinners at this point.     Elevated troponin levels Likely due to PE No  further chest pain Echo essentially the same as in 2022 Do not think she is a good candidate for further cardiac workup-given her advanced malignancy.   Stage IV adenocarcinoma Malignant pleural effusion Currently on observation-followed by Dr. Arbutus Ped Pleurex catheter in place for the past 1 year-continue to drain every 2-3 days.   Acute on chronic hypoxic respiratory failure Secondary to PE Back on usual 2-3 L of oxygen.   Rheumatoid arthritis Stable Plaquenil   COPD Stable Continue bronchodilators   Normocytic anemia Secondary to malignancy Stable for close periodic CBC monitoring   Debility/deconditioning Initially very reluctant to go to CIR or SNF-after extensive discussion-she is agreeable for SNF    BMI: Estimated body mass index is 24.37 kg/m as calculated from the following:   Height as of this encounter: 5\' 6"  (1.676 m).   Weight as of this encounter: 68.5 kg.    Discharge Diagnoses:  Principal Problem:   Acute pulmonary embolism without acute cor pulmonale (HCC) Active Problems:   Adenocarcinoma of right lung, stage 4 (HCC)   Rheumatoid arthritis involving multiple sites with positive rheumatoid factor (HCC)   Chronic respiratory failure with hypoxia [facility])   Discharge Instructions:  Activity:  As tolerated with Full fall precautions use walker/cane & assistance as needed   Discharge Instructions     Call MD for:  difficulty breathing, headache or visual disturbances   Complete by: As directed    Call MD for:  extreme fatigue   Complete by: As directed    Call MD for:  persistant dizziness or light-headedness   Complete by: As directed    Diet general  Complete by: As directed    Discharge instructions   Complete by: As directed    Follow with Primary MD  [name], MD in 1-2 weeks  Please get a complete blood count and chemistry panel checked by your Primary MD at your next visit, and again as instructed by your Primary MD.  Get  Medicines reviewed and adjusted: Please take all your medications with you for your next visit with your Primary MD  Laboratory/radiological data: Please request your Primary MD to go over all hospital tests and procedure/radiological results at the follow up, please ask your Primary MD to get all Hospital records sent to his/her office.  In some cases, they will be blood work, cultures and biopsy results pending at the time of your discharge. Please request that your primary care M.D. follows up on these results.  Also Note the following: If you experience worsening of your admission symptoms, develop shortness of breath, life threatening emergency, suicidal or homicidal thoughts you must seek medical attention immediately by calling 911 or calling your MD immediately  if symptoms less severe.  You must read complete instructions/literature along with all the possible adverse reactions/side effects for all the Medicines you take and that have been prescribed to you. Take any new Medicines after you have completely understood and accpet all the possible adverse reactions/side effects.   Do not drive when taking Pain medications or sleeping medications (Benzodaizepines)  Do not take more than prescribed Pain, Sleep and Anxiety Medications. It is not advisable to combine anxiety,sleep and pain medications without talking with your primary care practitioner  Special Instructions: If you have smoked or chewed Tobacco  in the last 2 yrs please stop smoking, stop any regular Alcohol  and or any Recreational drug use.  Wear Seat belts while driving.  Please note: You were cared for by a hospitalist during your hospital stay. Once you are discharged, your primary care physician will handle any further medical issues. Please note that NO REFILLS for any discharge medications will be authorized once you are discharged, as it is imperative that you return to your primary care physician (or establish a  relationship with a primary care physician if you do not have one) for your post hospital discharge needs so that they can reassess your need for medications and monitor your lab values.   Increase activity slowly   Complete by: As directed    No wound care   Complete by: As directed    Pleural Drainage Schedule   Complete by: As directed    Pleural drainage schedule to start. Drain daily, up to max of 1L until patient is only able to drain out . If <16ml for 3 consecutive drains then drain every other day. If <110ml for 3 consecutive drains every other day then call the practice that inserted the catheter for evaluation and possible removal. [facility] office [phone]) or Interventional Radiology [phone]) or Dr. Thelma Barge [phone] or [facility] [phone]).      Allergies as of 03/12/2023   No Known Allergies      Medication List     TAKE these medications    acetaminophen 500 MG tablet Commonly known as: TYLENOL Take 1,000 mg by mouth every 6 (six) hours as needed for moderate pain.   albuterol (2.5 MG/3ML) 0.083% nebulizer solution Commonly known as: PROVENTIL Take 3 mLs (2.5 mg total) by nebulization every 4 (four) hours as needed for shortness of breath.   apixaban 5 MG Tabs tablet  Commonly known as: ELIQUIS Take 1 tablet (5 mg total) by mouth 2 (two) times daily.   ascorbic acid 500 MG tablet Commonly known as: VITAMIN C Take 500 mg by mouth daily.   hydroxychloroquine 200 MG tablet Commonly known as: PLAQUENIL Take 1 tablet (200 mg total) by mouth daily.   omeprazole 20 MG capsule Commonly known as: PRILOSEC Take 20 mg by mouth daily.   Stiolto Respimat 2.5-2.5 MCG/ACT Aers Generic drug: Tiotropium Bromide-Olodaterol Inhale 2 puffs into the lungs daily.   Vitamin D-3 125 MCG (5000 UT) Tabs Take 5,000 Units by mouth daily.        Contact information for follow-up providers     [name], MD. Schedule an appointment as soon as  possible for a visit in 1 week(s).   Specialty: Family Medicine Contact information: [address] [place] [address] [phone]         Si Gaul, MD. Schedule an appointment as soon as possible for a visit in 1 week(s).   Specialty: Oncology Contact information: [address] [place] [address] [phone]              Contact information for after-discharge care     Destination     [facility] Preferred SNF .   Service: Skilled Nursing Contact information: [address] [place] [address] [phone]                    No Known Allergies   Other Procedures/Studies: VAS [place] LOWER EXTREMITY VENOUS (DVT)  Result Date: 03/09/2023  Lower Venous DVT Study Patient Name:  Susan Holder  Date of Exam:   03/09/2023 Medical Rec #: [id number]          Accession #:    [id number] Date of Birth: 1956/01/05           Patient Gender: F Patient Age:   51 years Exam Location:  [facility] Procedure:      VAS [place] LOWER EXTREMITY VENOUS (DVT) Referring Phys: Lyda Perone --------------------------------------------------------------------------------  Indications: Pulmonary embolism, SOB, and chest pain.  Risk Factors: Confirmed PE. Comparison Study: No prior study. Performing Technologist: Fernande Bras  Examination Guidelines: A complete evaluation includes B-mode imaging, spectral Doppler, color Doppler, and power Doppler as needed of all accessible portions of each vessel. Bilateral testing is considered an integral part of a complete examination. Limited examinations for reoccurring indications may be performed as noted. The reflux portion of the exam is performed with the patient in reverse Trendelenburg.  +---------+---------------+---------+-----------+----------+--------------+ RIGHT    CompressibilityPhasicitySpontaneityPropertiesThrombus Aging  +---------+---------------+---------+-----------+----------+--------------+ CFV      Full           Yes      Yes                                 +---------+---------------+---------+-----------+----------+--------------+ SFJ      Full                                                        +---------+---------------+---------+-----------+----------+--------------+ FV Prox  Full                                                        +---------+---------------+---------+-----------+----------+--------------+  FV Mid   Full                                                        +---------+---------------+---------+-----------+----------+--------------+ FV DistalFull                                                        +---------+---------------+---------+-----------+----------+--------------+ PFV      Full                                                        +---------+---------------+---------+-----------+----------+--------------+ POP      Full           Yes      Yes                                 +---------+---------------+---------+-----------+----------+--------------+ PTV      Full                                                        +---------+---------------+---------+-----------+----------+--------------+ PERO     None           No       No                                  +---------+---------------+---------+-----------+----------+--------------+   +---------+---------------+---------+-----------+----------+--------------+ LEFT     CompressibilityPhasicitySpontaneityPropertiesThrombus Aging +---------+---------------+---------+-----------+----------+--------------+ CFV      Full           Yes      Yes                                 +---------+---------------+---------+-----------+----------+--------------+ SFJ      Full                                                         +---------+---------------+---------+-----------+----------+--------------+ FV Prox  Full                                                        +---------+---------------+---------+-----------+----------+--------------+ FV Mid   Full                                                        +---------+---------------+---------+-----------+----------+--------------+  FV DistalFull                                                        +---------+---------------+---------+-----------+----------+--------------+ PFV      Full                                                        +---------+---------------+---------+-----------+----------+--------------+ POP      Full           Yes      Yes                                 +---------+---------------+---------+-----------+----------+--------------+ PTV      Partial        No       No                                  +---------+---------------+---------+-----------+----------+--------------+ PERO     Full                                                        +---------+---------------+---------+-----------+----------+--------------+     Summary: BILATERAL: -No evidence of popliteal cyst, bilaterally. RIGHT: - Findings consistent with acute deep vein thrombosis involving the right peroneal veins.   LEFT: - Findings consistent with acute deep vein thrombosis involving the left posterior tibial veins.   *See table(s) above for measurements and observations. Electronically signed by Gerarda Fraction on 03/09/2023 at 6:13:19 PM.    Final    ECHOCARDIOGRAM COMPLETE  Result Date: 03/09/2023    ECHOCARDIOGRAM REPORT   Patient Name:   Susan Holder Date of Exam: 03/09/2023 Medical Rec #:  [id number]         Height:       66.0 in Accession #:    [id number]        Weight:       151.0 lb Date of Birth:  Feb 10, 1956          BSA:          1.775 m Patient Age:    67 years          BP:           120/69 mmHg Patient Gender: F                  HR:           97 bpm. Exam Location:  Inpatient Procedure: 2D Echo, Cardiac Doppler and Color Doppler Indications:    Pulmonary Embolus I26.09  History:        Patient has prior history of Echocardiogram examinations, most                 recent 11/09/2020. COPD.  Sonographer:    Harriette Bouillon RDCS Referring Phys: [phone] JARED M GARDNER IMPRESSIONS  1. Left ventricular ejection fraction, by estimation, is  45 to 50%. The left ventricle has mildly decreased function. The left ventricle demonstrates global hypokinesis appearing worse at the apex. There is mild concentric left ventricular hypertrophy. Left ventricular diastolic parameters are consistent with Grade I diastolic dysfunction (impaired relaxation).  2. D-shaped septum suggestive of RV pressure/volume overload. Right ventricular systolic function is mildly reduced. The right ventricular size is mildly enlarged. There is mildly elevated pulmonary artery systolic pressure. The estimated right ventricular systolic pressure is 41.9 mmHg.  3. The mitral valve is normal in structure. Trivial mitral valve regurgitation. No evidence of mitral stenosis.  4. The tricuspid valve is abnormal. Tricuspid valve regurgitation is moderate.  5. The aortic valve is tricuspid. Aortic valve regurgitation is moderate. No aortic stenosis is present.  6. The inferior vena cava is normal in size with greater than 50% respiratory variability, suggesting right atrial pressure of 3 mmHg. FINDINGS  Left Ventricle: Left ventricular ejection fraction, by estimation, is 45 to 50%. The left ventricle has mildly decreased function. The left ventricle demonstrates global hypokinesis. The left ventricular internal cavity size was normal in size. There is  mild concentric left ventricular hypertrophy. Left ventricular diastolic parameters are consistent with Grade I diastolic dysfunction (impaired relaxation). Right Ventricle: D-shaped septum suggestive of RV pressure/volume overload. The right  ventricular size is mildly enlarged. No increase in right ventricular wall thickness. Right ventricular systolic function is mildly reduced. There is mildly elevated pulmonary artery systolic pressure. The tricuspid regurgitant velocity is 3.12 m/s, and with an assumed right atrial pressure of 3 mmHg, the estimated right ventricular systolic pressure is 41.9 mmHg. Left Atrium: Left atrial size was normal in size. Right Atrium: Right atrial size was normal in size. Pericardium: There is no evidence of pericardial effusion. Mitral Valve: The mitral valve is normal in structure. Trivial mitral valve regurgitation. No evidence of mitral valve stenosis. Tricuspid Valve: The tricuspid valve is abnormal. Tricuspid valve regurgitation is moderate. Aortic Valve: The aortic valve is tricuspid. Aortic valve regurgitation is moderate. Aortic regurgitation PHT measures 800 msec. No aortic stenosis is present. Pulmonic Valve: The pulmonic valve was normal in structure. Pulmonic valve regurgitation is not visualized. Aorta: The aortic root is normal in size and structure. Venous: The inferior vena cava is normal in size with greater than 50% respiratory variability, suggesting right atrial pressure of 3 mmHg. IAS/Shunts: No atrial level shunt detected by color flow Doppler.  LEFT VENTRICLE PLAX 2D LVIDd:         3.90 cm   Diastology LVIDs:         2.90 cm   LV e' medial:    9.14 cm/s LV PW:         1.00 cm   LV E/e' medial:  6.8 LV IVS:        1.10 cm   LV e' lateral:   15.60 cm/s LVOT diam:     1.90 cm   LV E/e' lateral: 4.0 LV SV:         31 LV SV Index:   17 LVOT Area:     2.84 cm  RIGHT VENTRICLE            IVC RV S prime:     9.57 cm/s  IVC diam: 1.30 cm TAPSE (M-mode): 1.1 cm LEFT ATRIUM             Index       RIGHT ATRIUM           Index LA diam:  2.50 cm 1.41 cm/m  RA Area:     10.50 cm LA Vol (A2C):   9.4 ml  5.30 ml/m  RA Volume:   21.70 ml  12.23 ml/m LA Vol (A4C):   10.5 ml 5.92 ml/m LA Biplane Vol: 9.9 ml   5.60 ml/m  AORTIC VALVE LVOT Vmax:   65.50 cm/s LVOT Vmean:  44.600 cm/s LVOT VTI:    0.108 m AI PHT:      800 msec  AORTA Ao Root diam: 2.80 cm Ao Asc diam:  2.80 cm MITRAL VALVE               TRICUSPID VALVE MV Area (PHT): 3.83 cm    TR Peak grad:   38.9 mmHg MV Decel Time: 198 msec    TR Vmax:        312.00 cm/s MV E velocity: 62.40 cm/s MV A velocity: 73.40 cm/s  SHUNTS MV E/A ratio:  0.85        Systemic VTI:  0.11 m                            Systemic Diam: 1.90 cm Dalton McleanMD Electronically signed by Wilfred Lacy Signature Date/Time: 03/09/2023/9:25:05 AM    Final    CT Angio Chest PE W and/or Wo Contrast  Result Date: 03/09/2023 CLINICAL DATA:  67 year old female with history of acute onset of chest pain. Evaluate for pulmonary embolism. History of lung cancer. * Tracking Code: BO * EXAM: CT ANGIOGRAPHY CHEST WITH CONTRAST TECHNIQUE: Multidetector CT imaging of the chest was performed using the standard protocol during bolus administration of intravenous contrast. Multiplanar CT image reconstructions and MIPs were obtained to evaluate the vascular anatomy. RADIATION DOSE REDUCTION: This exam was performed according to the departmental dose-optimization program which includes automated exposure control, adjustment of the mA and/or kV according to patient size and/or use of iterative reconstruction technique. CONTRAST:  75mL OMNIPAQUE IOHEXOL 350 MG/ML SOLN COMPARISON:  Chest CT 02/11/2023. FINDINGS: Cardiovascular: There are multiple filling defects in the left-sided pulmonary arterial tree involving segmental and subsegmental sized pulmonary artery branches to the left upper lobe, indicative of pulmonary embolism. These appear nonocclusive at this time. No definite right-sided filling defects are noted. Heart size is normal. There is no significant pericardial fluid, thickening or pericardial calcification. Atherosclerotic calcifications in the thoracic aorta as well as the left main, left  anterior descending and right coronary arteries. Mediastinum/Nodes: Prominent nodal tissue is noted measuring up to 1.4 cm in short axis in the subcarinal region. Architectural distortion in the right hilar region noted. No definite discrete hilar lymphadenopathy confidently identified. Esophagus is unremarkable in appearance. No axillary lymphadenopathy. Lungs/Pleura: Mass-like architectural distortion in the perihilar aspect of the right lung, likely reflective of chronic postradiation mass-like fibrosis. Right-sided chest tube in position with tip extending into the posterior aspect of the upper right hemithorax within a large right-sided pleural effusion which has some associated pleural enhancement. In the posterior aspect of the left upper lobe (axial image 53 of series 10) there is a 9 x 9 mm nodule adjacent to a fiducial marker, corresponding to the partially treated neoplasm. Areas of ground-glass attenuation and septal thickening are noted in the surrounding lung, likely to reflect evolving postradiation pneumonitis. No left pleural effusion. No other new suspicious appearing pulmonary nodules or masses are noted. Diffuse bronchial wall thickening with moderate centrilobular and mild paraseptal emphysema. Upper Abdomen: Aortic atherosclerosis. Musculoskeletal: There are no  aggressive appearing lytic or blastic lesions noted in the visualized portions of the skeleton. Review of the MIP images confirms the above findings. IMPRESSION: 1. Study is positive for pulmonary embolism with multiple nonocclusive segmental and subsegmental sized filling defects to the left upper lobe, as above. 2. Right-sided chest tube stable in position with tip in the posterior aspect of the upper right hemithorax. Large chronic right pleural effusion with pleural enhancement noted. 3. Treated nodule in the posterior aspect of the left upper lobe again noted, with evolving postradiation changes in the surrounding lung parenchyma.  4. Diffuse bronchial wall thickening with moderate centrilobular and mild paraseptal emphysema; imaging findings suggestive of underlying COPD. 5. Aortic atherosclerosis, in addition to left main and 2 vessel coronary artery disease. Please note that although the presence of coronary artery calcium documents the presence of coronary artery disease, the severity of this disease and any potential stenosis cannot be assessed on this non-gated CT examination. Assessment for potential risk factor modification, dietary therapy or pharmacologic therapy may be warranted, if clinically indicated. Critical Value/emergent results were discussed by telephone at the time of interpretation on 03/09/2023 at 5:44 am to provider Dr. Julian Reil, who verbally acknowledged these results. Aortic Atherosclerosis (ICD10-I70.0) and Emphysema (ICD10-J43.9). Electronically Signed   By: Trudie Reed M.D.   On: 03/09/2023 05:44   DG Chest Portable 1 View  Result Date: 03/08/2023 CLINICAL DATA:  Chest pain and shortness of breath EXAM: PORTABLE CHEST 1 VIEW COMPARISON:  Radiograph 01/22/2023 and CT 02/11/2023 FINDINGS: Right chest tube in the moderate-to-large partially loculated right pleural effusion which is grossly similar to CT chest 02/11/2023. Associated airspace and interstitial opacities the right mid and lower lung. Interstitial thickening in the left mid and lower lung. Small left pleural effusion. No pneumothorax. IMPRESSION: 1. Right chest tube in the moderate-to-large partially loculated right pleural effusion which is grossly similar to CT chest 02/11/2023. 2. Airspace and interstitial opacities in the bilateral mid and lower lungs are slightly increased compared to radiographs 01/22/2023 and are suspicious for edema or infection. Electronically Signed   By: Minerva Fester M.D.   On: 03/08/2023 23:39   MR Brain W Wo Contrast  Result Date: 02/25/2023 CLINICAL DATA:  Brain metastases, assess treatment response 3T SRS  Protocol. Short interval scan after course of steroids. EXAM: MRI HEAD WITHOUT AND WITH CONTRAST TECHNIQUE: Multiplanar, multiecho pulse sequences of the brain and surrounding structures were obtained without and with intravenous contrast. CONTRAST:  7.5 mL Vueway. COMPARISON:  None Available. FINDINGS: BRAIN New Lesions: None. Larger lesions: None. Stable or Smaller lesions: *Unchanged 15 x 14 mm enhancing lesion in the medial inferior aspect of the left cerebellar hemisphere (axial image 122 series 100). Slightly decreased surrounding vasogenic edema with decreased mass effect on the fourth ventricle (axial image 25 series 10). *Unchanged cluster of smaller foci of enhancement in the lateral aspect of the inferior left cerebellar hemisphere (axial images 116-121 series 100). *Unchanged 8 x 7 mm enhancing lesion in the anterior perforated substance of the basal right frontal lobe (axial image 89 series 100). Slightly decreased surrounding vasogenic edema (axial image 35 series 10). Other Brain findings: No acute infarct or hemorrhage. Stable background of moderate chronic small-vessel disease. No hydrocephalus, extra-axial collection, or midline shift. Vascular: Normal flow voids and vessel enhancement. Skull and upper cervical spine: Normal marrow signal and enhancement. Sinuses/Orbits: No acute findings. Other: None. IMPRESSION: Unchanged size of enhancing lesions in the left cerebellar hemisphere and basal right frontal lobe with slightly  decreased surrounding vasogenic edema. No new lesions. Electronically Signed   By: Orvan Falconer M.D.   On: 02/25/2023 11:56   CT CHEST ABDOMEN PELVIS W CONTRAST  Result Date: 02/17/2023 CLINICAL DATA:  Lung cancer restaging * Tracking Code: BO * EXAM: CT CHEST, ABDOMEN, AND PELVIS WITH CONTRAST TECHNIQUE: Multidetector CT imaging of the chest, abdomen and pelvis was performed following the standard protocol during bolus administration of intravenous contrast. RADIATION  DOSE REDUCTION: This exam was performed according to the departmental dose-optimization program which includes automated exposure control, adjustment of the mA and/or kV according to patient size and/or use of iterative reconstruction technique. CONTRAST:  OMNIPAQUE IOHEXOL 300 MG/ML  SOLN COMPARISON:  PET-CT, 11/06/2022 FINDINGS: CT CHEST FINDINGS Cardiovascular: Aortic atherosclerosis. Normal heart size. Left and right coronary artery calcifications. No pericardial effusion. Mediastinum/Nodes: Unchanged soft tissue thickening about the right hilum (series 2, image 23). No discretely enlarged mediastinal, hilar, or axillary lymph nodes. Thyroid gland, trachea, and esophagus demonstrate no significant findings. Lungs/Pleura: Similar appearance of a moderate, loculated right hydropneumothorax containing tunneled pleural drainage catheter. Unchanged post treatment appearance of the right lung with dense perihilar and right lower lobe fibrosis and consolidation. Slightly diminished size of a previously FDG avid spiculated nodule of the posterior left upper lobe, which tents the adjacent fissure and measures 0.9 x 0.7 cm, previously 1.2 x 0.8 cm when measured similarly (series 4, image 60). Interval placement of biopsy marking clip adjacent to this nodule (series 4, image 63). Substantially diminished solid character of a previously FDG avid consolidation of the anterior subpleural left upper lobe (series 4, image 64). Moderate underlying centrilobular emphysema. Scarring of the dependent left lower lobe. Musculoskeletal: No chest wall abnormality. No acute osseous findings. CT ABDOMEN PELVIS FINDINGS Hepatobiliary: No solid liver abnormality is seen. No gallstones, gallbladder wall thickening, or biliary dilatation. Pancreas: Unremarkable. No pancreatic ductal dilatation or surrounding inflammatory changes. Spleen: Normal in size without significant abnormality. Adrenals/Urinary Tract: Adrenal glands are  unremarkable. Kidneys are normal, without renal calculi, solid lesion, or hydronephrosis. Bladder is unremarkable. Stomach/Bowel: Stomach is within normal limits. Appendix not clearly visualized. No evidence of bowel wall thickening, distention, or inflammatory changes. Sigmoid diverticulosis. Vascular/Lymphatic: Aortic atherosclerosis. No enlarged abdominal or pelvic lymph nodes. Reproductive: No mass or other abnormality. Other: No abdominal wall hernia or abnormality. No ascites. Musculoskeletal: No acute osseous findings. IMPRESSION: 1. Slightly diminished size of a previously FDG avid spiculated nodule of the posterior left upper lobe, consistent with treatment response. Interval placement of biopsy marking clip adjacent to this nodule. 2. Substantially diminished solid character of a previously FDG avid consolidation of the anterior subpleural left upper lobe, likely reflecting radiation pneumonitis. Continued attention on follow-up. 3. Unchanged post treatment/post radiation appearance of the right lung with dense perihilar and right lower lobe radiation fibrosis and consolidation. Unchanged soft tissue thickening about the right hilum. 4. No evidence of lymphadenopathy or metastatic disease in the abdomen or pelvis. 5. Similar appearance of a moderate, loculated right hydropneumothorax containing tunneled pleural drainage catheter. 6. Coronary artery disease. Aortic Atherosclerosis (ICD10-I70.0) and Emphysema (ICD10-J43.9). Electronically Signed   By: Jearld Lesch M.D.   On: 02/17/2023 08:56     TODAY-DAY OF DISCHARGE:  Subjective:   Susan Holder today has no headache,no chest abdominal pain,no new weakness tingling or numbness, feels much better wants to go home today.   Objective:   Blood pressure (!) 113/54, pulse 94, temperature 98.7 F (37.1 C), temperature source Oral, resp. rate 18, height  5\' 6"  (1.676 m), weight 68.5 kg, last menstrual period 04/28/2000, SpO2 95%. No intake or output  data in the 24 hours ending 03/12/23 1123 Filed Weights   03/08/23 2142  Weight: 68.5 kg    Exam: Awake Alert, Oriented *3, No new F.N deficits, Normal affect [place].AT,PERRAL Supple Neck,No JVD, No cervical lymphadenopathy appriciated.  Symmetrical Chest wall movement, Good air movement bilaterally, CTAB RRR,No Gallops,Rubs or new Murmurs, No Parasternal Heave +ve B.Sounds, Abd Soft, Non tender, No organomegaly appriciated, No rebound -guarding or rigidity. No Cyanosis, Clubbing or edema, No new Rash or bruise   PERTINENT RADIOLOGIC STUDIES: No results found.   PERTINENT LAB RESULTS: CBC: Recent Labs    03/10/23 0345 03/11/23 0310  WBC 8.8 13.9*  HGB 10.4* 10.2*  HCT 34.0* 32.9*  PLT 341 299   CMET CMP     Component Value Date/Time   NA 137 03/10/2023 0345   K 4.0 03/10/2023 0345   CL 100 03/10/2023 0345   CO2 28 03/10/2023 0345   GLUCOSE 96 03/10/2023 0345   BUN 15 03/10/2023 0345   CREATININE 1.23 (H) 03/10/2023 0345   CREATININE 1.16 (H) 02/11/2023 0856   CREATININE 0.80 07/24/2017 1438   CALCIUM 10.0 03/10/2023 0345   PROT 6.9 02/11/2023 0856   ALBUMIN 3.3 (L) 02/11/2023 0856   AST 22 02/11/2023 0856   ALT 20 02/11/2023 0856   ALKPHOS 95 02/11/2023 0856   BILITOT 0.4 02/11/2023 0856   GFRNONAA 48 (L) 03/10/2023 0345   GFRNONAA 51 (L) 02/11/2023 0856   GFRNONAA 78 07/24/2017 1438    GFR Estimated Creatinine Clearance: 40.4 mL/min (A) (by C-G formula based on SCr of 1.23 mg/dL (H)). No results for input(s): "LIPASE", "AMYLASE" in the last 72 hours. No results for input(s): "CKTOTAL", "CKMB", "CKMBINDEX", "TROPONINI" in the last 72 hours. Invalid input(s): "POCBNP" No results for input(s): "DDIMER" in the last 72 hours. No results for input(s): "HGBA1C" in the last 72 hours. No results for input(s): "CHOL", "HDL", "LDLCALC", "TRIG", "CHOLHDL", "LDLDIRECT" in the last 72 hours. No results for input(s): "TSH", "T4TOTAL", "T3FREE", "THYROIDAB" in the last 72  hours.  Invalid input(s): "FREET3" No results for input(s): "VITAMINB12", "FOLATE", "FERRITIN", "TIBC", "IRON", "RETICCTPCT" in the last 72 hours. Coags: No results for input(s): "INR" in the last 72 hours.  Invalid input(s): "PT" Microbiology: No results found for this or any previous visit (from the past 240 hour(s)).  FURTHER DISCHARGE INSTRUCTIONS:  Get Medicines reviewed and adjusted: Please take all your medications with you for your next visit with your Primary MD  Laboratory/radiological data: Please request your Primary MD to go over all hospital tests and procedure/radiological results at the follow up, please ask your Primary MD to get all Hospital records sent to his/her office.  In some cases, they will be blood work, cultures and biopsy results pending at the time of your discharge. Please request that your primary care M.D. goes through all the records of your hospital data and follows up on these results.  Also Note the following: If you experience worsening of your admission symptoms, develop shortness of breath, life threatening emergency, suicidal or homicidal thoughts you must seek medical attention immediately by calling 911 or calling your MD immediately  if symptoms less severe.  You must read complete instructions/literature along with all the possible adverse reactions/side effects for all the Medicines you take and that have been prescribed to you. Take any new Medicines after you have completely understood and accpet all the possible  adverse reactions/side effects.   Do not drive when taking Pain medications or sleeping medications (Benzodaizepines)  Do not take more than prescribed Pain, Sleep and Anxiety Medications. It is not advisable to combine anxiety,sleep and pain medications without talking with your primary care practitioner  Special Instructions: If you have smoked or chewed Tobacco  in the last 2 yrs please stop smoking, stop any regular Alcohol   and or any Recreational drug use.  Wear Seat belts while driving.  Please note: You were cared for by a hospitalist during your hospital stay. Once you are discharged, your primary care physician will handle any further medical issues. Please note that NO REFILLS for any discharge medications will be authorized once you are discharged, as it is imperative that you return to your primary care physician (or establish a relationship with a primary care physician if you do not have one) for your post hospital discharge needs so that they can reassess your need for medications and monitor your lab values.  Total Time spent coordinating discharge including counseling, education and face to face time equals greater than 30 minutes.  SignedJeoffrey Massed 03/12/2023 11:23 AM

## 2023-03-12 NOTE — Care Management Important Message (Signed)
Important Message  Patient Details  Name: Susan Holder MRN: [id number] Date of Birth: [birth year]   Important Message Given:  Yes - Medicare IM     Susan Holder 03/12/2023, 3:26 PM

## 2023-03-12 NOTE — Care Management Important Message (Signed)
Important Message  Patient Details  Name: Susan Holder MRN: [id number] Date of Birth: Sep 05, 1955   Important Message Given:  Yes - Medicare IM     Dorena Bodo 03/12/2023, 12:09 PM

## 2023-03-12 NOTE — Plan of Care (Signed)

## 2023-03-16 ENCOUNTER — Ambulatory Visit
Admission: RE | Admit: 2023-03-16 | Discharge: 2023-03-16 | Disposition: A | Discharge status: Home / Self Care | Payer: Medicare PPO | Source: Ambulatory Visit | Attending: Internal Medicine | Admitting: Internal Medicine

## 2023-03-16 DIAGNOSIS — R2681 Unsteadiness on feet: Secondary | ICD-10-CM | POA: Diagnosis not present

## 2023-03-16 DIAGNOSIS — J449 Chronic obstructive pulmonary disease, unspecified: Secondary | ICD-10-CM | POA: Diagnosis not present

## 2023-03-16 DIAGNOSIS — I824Z3 Acute embolism and thrombosis of unspecified deep veins of distal lower extremity, bilateral: Secondary | ICD-10-CM | POA: Diagnosis not present

## 2023-03-16 DIAGNOSIS — M6281 Muscle weakness (generalized): Secondary | ICD-10-CM | POA: Diagnosis not present

## 2023-03-16 DIAGNOSIS — I2609 Other pulmonary embolism with acute cor pulmonale: Secondary | ICD-10-CM | POA: Diagnosis not present

## 2023-03-16 DIAGNOSIS — J910 Malignant pleural effusion: Secondary | ICD-10-CM | POA: Diagnosis not present

## 2023-03-16 DIAGNOSIS — I2699 Other pulmonary embolism without acute cor pulmonale: Secondary | ICD-10-CM | POA: Diagnosis not present

## 2023-03-16 DIAGNOSIS — J9611 Chronic respiratory failure with hypoxia: Secondary | ICD-10-CM | POA: Diagnosis not present

## 2023-03-16 DIAGNOSIS — R609 Edema, unspecified: Secondary | ICD-10-CM | POA: Diagnosis not present

## 2023-03-17 NOTE — Progress Notes (Addendum)
  Radiation Oncology         [phone]) [phone] ________________________________  Name: Susan Holder MRN: [id number]  Date of Service: 03/17/2023  DOB: [birth year]  Post Treatment Telephone Note  Diagnosis:  Progressive Metastatic Stage IIIA, cT3N1-2M0 NSCLC, adenocarcinoma of the right lower lobe with brain metastases  (as documented in provider EOT note)   The patient was available for call today.   Symptoms of fatigue have not improved since completing therapy.  Symptoms of skin changes have improved since completing therapy.  Symptoms of esophagitis have improved since completing therapy.  The patient has scheduled follow up with her medical oncologist Dr. Arbutus Ped for ongoing care, and was encouraged to call if she develops concerns or questions regarding radiation.   This concludes the interaction.  Ruel Favors, LPN

## 2023-03-18 NOTE — Telephone Encounter (Signed)
Telephone call  

## 2023-03-19 DIAGNOSIS — J449 Chronic obstructive pulmonary disease, unspecified: Secondary | ICD-10-CM | POA: Diagnosis not present

## 2023-03-19 DIAGNOSIS — I2609 Other pulmonary embolism with acute cor pulmonale: Secondary | ICD-10-CM | POA: Diagnosis not present

## 2023-03-19 DIAGNOSIS — J9611 Chronic respiratory failure with hypoxia: Secondary | ICD-10-CM | POA: Diagnosis not present

## 2023-03-19 DIAGNOSIS — M6281 Muscle weakness (generalized): Secondary | ICD-10-CM | POA: Diagnosis not present

## 2023-03-19 DIAGNOSIS — I2699 Other pulmonary embolism without acute cor pulmonale: Secondary | ICD-10-CM | POA: Diagnosis not present

## 2023-03-19 DIAGNOSIS — R2681 Unsteadiness on feet: Secondary | ICD-10-CM | POA: Diagnosis not present

## 2023-03-21 DIAGNOSIS — I2699 Other pulmonary embolism without acute cor pulmonale: Secondary | ICD-10-CM | POA: Diagnosis not present

## 2023-03-21 DIAGNOSIS — D649 Anemia, unspecified: Secondary | ICD-10-CM | POA: Diagnosis not present

## 2023-03-21 DIAGNOSIS — J9621 Acute and chronic respiratory failure with hypoxia: Secondary | ICD-10-CM | POA: Diagnosis not present

## 2023-03-21 DIAGNOSIS — I824Z3 Acute embolism and thrombosis of unspecified deep veins of distal lower extremity, bilateral: Secondary | ICD-10-CM | POA: Diagnosis not present

## 2023-03-23 DIAGNOSIS — J449 Chronic obstructive pulmonary disease, unspecified: Secondary | ICD-10-CM | POA: Diagnosis not present

## 2023-03-23 DIAGNOSIS — I2699 Other pulmonary embolism without acute cor pulmonale: Secondary | ICD-10-CM | POA: Diagnosis not present

## 2023-03-23 DIAGNOSIS — I2609 Other pulmonary embolism with acute cor pulmonale: Secondary | ICD-10-CM | POA: Diagnosis not present

## 2023-03-23 DIAGNOSIS — R2681 Unsteadiness on feet: Secondary | ICD-10-CM | POA: Diagnosis not present

## 2023-03-23 DIAGNOSIS — M6281 Muscle weakness (generalized): Secondary | ICD-10-CM | POA: Diagnosis not present

## 2023-03-23 DIAGNOSIS — J9601 Acute respiratory failure with hypoxia: Secondary | ICD-10-CM | POA: Diagnosis not present

## 2023-03-23 DIAGNOSIS — R609 Edema, unspecified: Secondary | ICD-10-CM | POA: Diagnosis not present

## 2023-03-23 DIAGNOSIS — J9611 Chronic respiratory failure with hypoxia: Secondary | ICD-10-CM | POA: Diagnosis not present

## 2023-03-24 ENCOUNTER — Other Ambulatory Visit (HOSPITAL_COMMUNITY): Payer: Medicare PPO

## 2023-03-24 DIAGNOSIS — C3491 Malignant neoplasm of unspecified part of right bronchus or lung: Secondary | ICD-10-CM | POA: Diagnosis not present

## 2023-03-24 DIAGNOSIS — R609 Edema, unspecified: Secondary | ICD-10-CM | POA: Diagnosis not present

## 2023-03-24 DIAGNOSIS — I2699 Other pulmonary embolism without acute cor pulmonale: Secondary | ICD-10-CM | POA: Diagnosis not present

## 2023-03-24 DIAGNOSIS — J9621 Acute and chronic respiratory failure with hypoxia: Secondary | ICD-10-CM | POA: Diagnosis not present

## 2023-03-25 ENCOUNTER — Telehealth: Payer: Self-pay | Admitting: Primary Care

## 2023-03-25 DIAGNOSIS — J90 Pleural effusion, not elsewhere classified: Secondary | ICD-10-CM

## 2023-03-25 NOTE — Telephone Encounter (Signed)
Called and spoke with pt who just got out of rehab today. Pt states she has blood clots and pleurx has nothing coming out of it. Believes it may be clogged or ready to be taken out. Its not hurting sending as fyi. Recently got out of ED and on eliquis

## 2023-03-25 NOTE — Telephone Encounter (Signed)
Pleurx tube is not flowing anything out and she has bloodclots on lungs

## 2023-03-28 DIAGNOSIS — C3490 Malignant neoplasm of unspecified part of unspecified bronchus or lung: Secondary | ICD-10-CM | POA: Diagnosis not present

## 2023-03-30 ENCOUNTER — Inpatient Hospital Stay: Payer: Medicare PPO | Admitting: Family Medicine

## 2023-03-30 ENCOUNTER — Telehealth: Payer: Self-pay | Admitting: Family Medicine

## 2023-03-30 NOTE — Telephone Encounter (Signed)
  No Show/Cancel within 24 hours (pt states no reason)  1st missed visit

## 2023-03-31 DIAGNOSIS — C770 Secondary and unspecified malignant neoplasm of lymph nodes of head, face and neck: Secondary | ICD-10-CM | POA: Diagnosis not present

## 2023-03-31 DIAGNOSIS — I824Z3 Acute embolism and thrombosis of unspecified deep veins of distal lower extremity, bilateral: Secondary | ICD-10-CM | POA: Diagnosis not present

## 2023-03-31 DIAGNOSIS — C7931 Secondary malignant neoplasm of brain: Secondary | ICD-10-CM | POA: Diagnosis not present

## 2023-03-31 DIAGNOSIS — I2699 Other pulmonary embolism without acute cor pulmonale: Secondary | ICD-10-CM | POA: Diagnosis not present

## 2023-03-31 DIAGNOSIS — C3491 Malignant neoplasm of unspecified part of right bronchus or lung: Secondary | ICD-10-CM | POA: Diagnosis not present

## 2023-03-31 DIAGNOSIS — D630 Anemia in neoplastic disease: Secondary | ICD-10-CM | POA: Diagnosis not present

## 2023-03-31 DIAGNOSIS — J9621 Acute and chronic respiratory failure with hypoxia: Secondary | ICD-10-CM | POA: Diagnosis not present

## 2023-03-31 DIAGNOSIS — M069 Rheumatoid arthritis, unspecified: Secondary | ICD-10-CM | POA: Diagnosis not present

## 2023-03-31 DIAGNOSIS — J449 Chronic obstructive pulmonary disease, unspecified: Secondary | ICD-10-CM | POA: Diagnosis not present

## 2023-04-02 ENCOUNTER — Telehealth: Payer: Self-pay | Admitting: Family Medicine

## 2023-04-02 DIAGNOSIS — J449 Chronic obstructive pulmonary disease, unspecified: Secondary | ICD-10-CM | POA: Diagnosis not present

## 2023-04-02 DIAGNOSIS — D630 Anemia in neoplastic disease: Secondary | ICD-10-CM | POA: Diagnosis not present

## 2023-04-02 DIAGNOSIS — M069 Rheumatoid arthritis, unspecified: Secondary | ICD-10-CM | POA: Diagnosis not present

## 2023-04-02 DIAGNOSIS — I2699 Other pulmonary embolism without acute cor pulmonale: Secondary | ICD-10-CM | POA: Diagnosis not present

## 2023-04-02 DIAGNOSIS — C770 Secondary and unspecified malignant neoplasm of lymph nodes of head, face and neck: Secondary | ICD-10-CM | POA: Diagnosis not present

## 2023-04-02 DIAGNOSIS — C7931 Secondary malignant neoplasm of brain: Secondary | ICD-10-CM | POA: Diagnosis not present

## 2023-04-02 DIAGNOSIS — J9621 Acute and chronic respiratory failure with hypoxia: Secondary | ICD-10-CM | POA: Diagnosis not present

## 2023-04-02 DIAGNOSIS — I824Z3 Acute embolism and thrombosis of unspecified deep veins of distal lower extremity, bilateral: Secondary | ICD-10-CM | POA: Diagnosis not present

## 2023-04-02 DIAGNOSIS — C3491 Malignant neoplasm of unspecified part of right bronchus or lung: Secondary | ICD-10-CM | POA: Diagnosis not present

## 2023-04-02 NOTE — Telephone Encounter (Signed)
[facility] [phone] Marsha  OT 1xWx2 1xW every other for 3

## 2023-04-03 NOTE — Telephone Encounter (Signed)
Mindi Junker is aware and verbalized understanding.

## 2023-04-06 ENCOUNTER — Ambulatory Visit (HOSPITAL_BASED_OUTPATIENT_CLINIC_OR_DEPARTMENT_OTHER): Payer: Medicare PPO | Admitting: Pulmonary Disease

## 2023-04-06 ENCOUNTER — Encounter (HOSPITAL_BASED_OUTPATIENT_CLINIC_OR_DEPARTMENT_OTHER): Payer: Self-pay | Admitting: Pulmonary Disease

## 2023-04-06 ENCOUNTER — Ambulatory Visit (HOSPITAL_BASED_OUTPATIENT_CLINIC_OR_DEPARTMENT_OTHER): Payer: Medicare PPO

## 2023-04-06 VITALS — BP 132/60 | HR 107 | Resp 16 | Ht 66.0 in | Wt 157.7 lb

## 2023-04-06 DIAGNOSIS — C7800 Secondary malignant neoplasm of unspecified lung: Secondary | ICD-10-CM

## 2023-04-06 DIAGNOSIS — J910 Malignant pleural effusion: Secondary | ICD-10-CM

## 2023-04-06 DIAGNOSIS — J9611 Chronic respiratory failure with hypoxia: Secondary | ICD-10-CM | POA: Diagnosis not present

## 2023-04-06 DIAGNOSIS — J90 Pleural effusion, not elsewhere classified: Secondary | ICD-10-CM | POA: Diagnosis not present

## 2023-04-06 MED ORDER — BACITRACIN 500 UNIT/GM EX OINT: 1.0000 | TOPICAL_OINTMENT | Freq: Two times a day (BID) | CUTANEOUS | 0 refills | Status: DC

## 2023-04-06 MED ORDER — CEPHALEXIN 500 MG PO CAPS: 500.0000 mg | ORAL_CAPSULE | Freq: Three times a day (TID) | ORAL | 0 refills | Status: AC

## 2023-04-06 NOTE — Assessment & Plan Note (Addendum)
Status post Pleurx catheter with decreased drainage.  We obtain chest x-ray today which shows persistent moderate right pleural effusion with Pleurx catheter in place.  Catheter shows minimal bloody fluid, skin appears macerated suggesting wound infection there is mild foul smell. Will give her Keflex for 7 to 10 days.  If she develops fever or chills, she would have to go to the emergency room.  Unfortunately Pleurx catheter appears nonfunctional and may have to be removed.  Have asked her to attempt 1-2 more times to see if she can have drainage from it.

## 2023-04-06 NOTE — Assessment & Plan Note (Signed)
Continue on oxygen 

## 2023-04-06 NOTE — Telephone Encounter (Signed)
CMA was able to schedule pt with Dr. Vassie Loll today for troubleshooting. Would recommend having her get a CXR before seeing him. Thanks!

## 2023-04-06 NOTE — Progress Notes (Signed)
   Subjective:    Patient ID: Susan Holder, female    DOB: 1955/08/27, 67 y.o.   MRN: [id number]  HPI 67 year old former smoker with stage IV adenocarcinoma with brain metastases and malignant right pleural effusion, diagnosed 2019  She has chronic hypoxemic respiratory failure on 2 L/min  Previously followed by Dr. Thora Lance , now [facility] She had a Flexible bronchoscopy with bronchial alveolar lavage  and Transbronchial lung biopsy, single lobe , with right sided Pleurex tube  placement in 03/11/2022   She had a new nodule in the left upper lobe >> navigation bronchoscopy>>metachronous lung cancer.    11/27 call >> got out of rehab today. Pt states she has blood clots and pleurx has nothing coming out of it. Believes it may be clogged or ready to be taken out   12/9 call >>Patient states pleurx tube has blood in it. Patient states has blood clots in lungs. Patient currently taking Eliquis   Chief Complaint  Patient presents with   Acute Visit    Itches around incision, has blood in pleurx tube, unable to pump anything out past 3 times she tried.    Acute visit for decrease drainage through Pleurx catheter.  Last 2 or 3 times she has had no drainage or minimal bloody drainage.  Last drainage was when she was hospitalized for PE 11/10.  Previous to that she has drained approximately 300 cc every 3 days. She also complains of some itching around the skin at exit site and foul smell  Significant tests/ events reviewed PFT 02/2018  normal airflows, possible subtle obstruction, Restricted lung volumes   CT angio chest 02/2023  pulmonary emboli with multiple nonocclusive segmental and subsegmental sized filling defects to the left upper lobe.  Large chronic right pleural effusion with pleural enhancement noted.  Review of Systems neg for any significant sore throat, dysphagia, itching, sneezing, nasal congestion or excess/ purulent secretions, fever, chills, sweats, unintended wt loss, pleuritic  or exertional cp, hempoptysis, orthopnea pnd or change in chronic leg swelling. Also denies presyncope, palpitations, heartburn, abdominal pain, nausea, vomiting, diarrhea or change in bowel or urinary habits, dysuria,hematuria, rash, arthralgias, visual complaints, headache, numbness weakness or ataxia.     Objective:   Physical Exam  Gen. Pleasant, obese, in no distress ENT - no lesions, no post nasal drip Neck: No JVD, no thyromegaly, no carotid bruits Lungs: no use of accessory muscles, no dullness to percussion, decreased on RT without rales or rhonchi  Pleurx tube has bloody fluid, skin appears macerated mild erythema, foul smell Cardiovascular: Rhythm regular, heart sounds  normal, no murmurs or gallops, no peripheral edema Musculoskeletal: No deformities, no cyanosis or clubbing , no tremors       Assessment & Plan:

## 2023-04-06 NOTE — Telephone Encounter (Signed)
Patient states pleurx tube has blood in it. Patient states has blood clots in lungs. Patient currently taking Eliquis. Patient phone number is [phone].

## 2023-04-06 NOTE — Telephone Encounter (Signed)
I called and spoke with the pt  She states that she has dried blood in her pleurex drainage tube  She tried draining 3 days ago and got no fluid at all  She is taking eliquis  Denies any increased SOB, cough, fevers  Next visit not scheduled until 05/15/23  Please advise, thanks!

## 2023-04-06 NOTE — Patient Instructions (Signed)
CXR today  X Rx for keflex 500 tid x 10 days

## 2023-04-06 NOTE — Telephone Encounter (Signed)
Appt scheduled with cxr

## 2023-04-08 ENCOUNTER — Ambulatory Visit: Payer: Medicare PPO | Admitting: Family Medicine

## 2023-04-08 ENCOUNTER — Encounter: Payer: Self-pay | Admitting: Family Medicine

## 2023-04-08 VITALS — BP 114/64 | HR 103 | Temp 96.1°F | Ht 66.0 in

## 2023-04-08 DIAGNOSIS — I2510 Atherosclerotic heart disease of native coronary artery without angina pectoris: Secondary | ICD-10-CM | POA: Diagnosis not present

## 2023-04-08 DIAGNOSIS — M0579 Rheumatoid arthritis with rheumatoid factor of multiple sites without organ or systems involvement: Secondary | ICD-10-CM

## 2023-04-08 DIAGNOSIS — J910 Malignant pleural effusion: Secondary | ICD-10-CM

## 2023-04-08 DIAGNOSIS — I2699 Other pulmonary embolism without acute cor pulmonale: Secondary | ICD-10-CM | POA: Diagnosis not present

## 2023-04-08 DIAGNOSIS — C3491 Malignant neoplasm of unspecified part of right bronchus or lung: Secondary | ICD-10-CM

## 2023-04-08 DIAGNOSIS — C3431 Malignant neoplasm of lower lobe, right bronchus or lung: Secondary | ICD-10-CM

## 2023-04-08 DIAGNOSIS — I3139 Other pericardial effusion (noninflammatory): Secondary | ICD-10-CM | POA: Diagnosis not present

## 2023-04-08 LAB — BASIC METABOLIC PANEL
BUN: 11 mg/dL (ref 6–23)
CO2: 32 meq/L (ref 19–32)
Calcium: 9.7 mg/dL (ref 8.4–10.5)
Chloride: 98 meq/L (ref 96–112)
Creatinine, Ser: 1.09 mg/dL (ref 0.40–1.20)
GFR: 51.83 mL/min — ABNORMAL LOW (ref >60.00)
Glucose, Bld: 92 mg/dL (ref 70–99)
Potassium: 3.6 meq/L (ref 3.5–5.1)
Sodium: 139 meq/L (ref 135–145)

## 2023-04-08 MED ORDER — FUROSEMIDE 20 MG PO TABS: 20.0000 mg | ORAL_TABLET | Freq: Every day | ORAL | 3 refills | Status: DC

## 2023-04-08 MED ORDER — POTASSIUM CHLORIDE ER 10 MEQ PO TBCR
10.0000 meq | EXTENDED_RELEASE_TABLET | Freq: Every day | ORAL | 3 refills | Status: DC
Start: 2023-04-08 — End: 2023-07-07

## 2023-04-08 MED ORDER — APIXABAN 5 MG PO TABS: 5.0000 mg | ORAL_TABLET | Freq: Two times a day (BID) | ORAL | 3 refills | Status: DC

## 2023-04-08 MED ORDER — APIXABAN 5 MG PO TABS: 5.0000 mg | ORAL_TABLET | Freq: Two times a day (BID) | ORAL | 0 refills | Status: DC

## 2023-04-08 NOTE — Assessment & Plan Note (Signed)
Improving on anticoagulation therapy.  Plan: Refill apixaban 5 mg twice daily. Continue anticoagulation as prescribed. Monitor for signs of bleeding or recurrent thrombosis. Routine follow-up as needed.

## 2023-04-08 NOTE — Assessment & Plan Note (Signed)
Stable.  Plan: Continue hydroxychloroquine as prescribed. Maintain routine follow-up with rheumatology.

## 2023-04-08 NOTE — Patient Instructions (Signed)
To we have refilled your medications as requested including the Eliquis, potassium, Lasix.  We are checking potassium levels with metabolic panel.  We have ordered an urgent echocardiogram and referral to cardiology to assist with care.  Please continue to follow-up with your specialist including oncology, rheumatology, cardiology, and pulmonology.

## 2023-04-08 NOTE — Progress Notes (Signed)
Assessment/Plan:   Problem List Items Addressed This Visit       Cardiovascular and Mediastinum   Coronary artery calcification seen on CT scan - Primary   Relevant Medications   furosemide (LASIX) 20 MG tablet   apixaban (ELIQUIS) 5 MG TABS tablet   Other Relevant Orders   Ambulatory referral to Cardiology   ECHOCARDIOGRAM COMPLETE   Pericardial effusion    Managed with Lasix 20 mg daily Difficulty scheduling cardiology appointment due to pending echocardiogram; previous echo canceled.  Plan: Order echocardiogram stat to expedite cardiology evaluation. Facilitate referral to cardiology in [facility] [place]. Order basic metabolic panel to monitor electrolytes and renal function. Refill furosemide and potassium chloride as prescribed.      Relevant Medications   potassium chloride (KLOR-CON) 10 MEQ tablet   furosemide (LASIX) 20 MG tablet   apixaban (ELIQUIS) 5 MG TABS tablet   Other Relevant Orders   Ambulatory referral to Cardiology   ECHOCARDIOGRAM COMPLETE   Acute pulmonary embolism without acute cor pulmonale (HCC)    Improving on anticoagulation therapy.  Plan: Refill apixaban 5 mg twice daily. Continue anticoagulation as prescribed. Monitor for signs of bleeding or recurrent thrombosis. Routine follow-up as needed.      Relevant Medications   furosemide (LASIX) 20 MG tablet   apixaban (ELIQUIS) 5 MG TABS tablet   Other Relevant Orders   Ambulatory referral to Cardiology   Basic Metabolic Panel (BMET)   ECHOCARDIOGRAM COMPLETE     Respiratory   Adenocarcinoma of right lung, stage 4 (HCC)    Oxygen-dependent at 2 L/min; increases flow rate with activity due to dyspnea. Continue oxygen therapy as needed. Monitor respiratory status. Continue regular follow-up with pulmonology and oncology.      Primary malignant neoplasm of bronchus of right lower lobe (HCC)   Malignant pleural effusion    Malignant pleural effusion managed with a PleurX catheter.  Catheter has stopped draining; infection at the site improved with cephalexin.  Plan: Continue cephalexin as prescribed. Follow up with pulmonology regarding catheter management--possible removal or replacement.         Musculoskeletal and Integument   Rheumatoid arthritis involving multiple sites with positive rheumatoid factor (HCC)    Stable.  Plan: Continue hydroxychloroquine as prescribed. Maintain routine follow-up with rheumatology.       Medications Discontinued During This Encounter  Medication Reason   furosemide (LASIX) 8 MG/ML solution    apixaban (ELIQUIS) 5 MG TABS tablet Reorder   potassium chloride (KLOR-CON) 10 MEQ tablet Reorder   apixaban (ELIQUIS) 5 MG TABS tablet    furosemide (LASIX) 20 MG tablet     Return if symptoms worsen or fail to improve.    Subjective:   Encounter date: 04/08/2023  Susan Holder is a 67 y.o. female who has Rheumatoid arthritis involving multiple sites with positive rheumatoid factor (HCC); ANA positive; Vitamin D deficiency; High risk medication use; Trigger finger, left ring finger; Trigger finger, right ring finger; DDD (degenerative disc disease), cervical; Smoker; Other fatigue; Left hand pain; Allergic rhinitis; Pulmonary nodules; Adenocarcinoma of right lung, stage 4 (HCC); Encounter for antineoplastic chemotherapy; Goals of care, counseling/discussion; Primary malignant neoplasm of bronchus of right lower lobe (HCC); Encounter for antineoplastic immunotherapy; Acid reflux; Hoarseness; 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); Metastasis to brain [facility]); met lung ca; Rheumatoid arthritis (HCC); Aortic atherosclerosis (HCC); Coronary artery calcification seen on CT scan; Pericardial effusion; S/P pericardial window creation; Malnutrition of moderate degree; Pleural effusion; Anemia; S/P Left sided pigtail catheter  placement; Dizziness; Unsteady gait when walking; Adenocarcinoma of right lung, stage 4 (HCC); Pneumonitis;  Centrilobular emphysema (HCC); Malignant pleural effusion; Chronic respiratory failure with hypoxia (HCC); and Acute pulmonary embolism without acute cor pulmonale (HCC) on their problem list..   She  has a past medical history of Acid reflux (09/21/2018), Adenocarcinoma of right lung, stage 4 (HCC) (11/17/2017), ADHD (attention deficit hyperactivity disorder), Allergic rhinitis (08/22/2013), ANA positive (05/29/2016), Anemia, Aortic atherosclerosis (HCC) (10/23/2020), Brain metastases (03/29/2019), COPD (chronic obstructive pulmonary disease) (HCC), Coronary artery calcification seen on CT scan (10/23/2020), DDD (degenerative disc disease), cervical (05/29/2016), Dyspnea, Encounter for antineoplastic chemotherapy (11/17/2017), Encounter for antineoplastic immunotherapy (02/01/2018), Goals of care, counseling/discussion (11/17/2017), High risk medication use (05/29/2016), History of blood transfusion, Hoarseness (03/16/2018), Left hand pain (03/06/2017), Lung mass, Malnutrition of moderate degree (02/09/2021), met lung ca (dx'd 09/2017), 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   Chief Complaint: Follow-up after recent hospitalization for pulmonary embolism and DVTs; issues with PleurX catheter drainage.  History of Present Illness:  Patient presents for follow-up after hospitalization from 03/08/2023 to 03/12/2023 for pulmonary embolism and deep vein thromboses in the upper thighs, likely secondary  to malignancy. Since discharge, the patient has seen pulmonology three days ago for management of a PleurX catheter placed for malignant pleural effusion. She reports the catheter has stopped draining despite residual fluid on recent chest X-ray. An infection at the catheter site was treated with cephalexin and ointment, with noted improvement in itching and odor.  The patient expresses difficulty in scheduling a cardiology appointment due to a pending echocardiogram; prior appointment was canceled due to transportation issues. She desires assistance in transferring care to a cardiologist in [place] for convenience.  She reports shortness of breath with activity, which is managed by increasing her oxygen flow rate above the baseline of 2 L/min as needed. Denies chest pain, fever, or chills. Currently receiving home health services including occupational and physical therapy. She has been home for one week after a two-week stay at a rehabilitation facility post-hospitalization.  Review of Systems:  Constitutional: Denies fever or chills. Eyes: No visual changes reported. ENT: No sore throat or nasal congestion. Cardiovascular: Denies chest pain; history of elevated troponins addressed during hospitalization. Respiratory: Shortness of breath with activity; no new cough. Gastrointestinal: Denies nausea or vomiting. Genitourinary: No urinary symptoms reported. Musculoskeletal: No new joint pain beyond chronic rheumatoid arthritis. Skin: Infection at PleurX catheter site improving. Neurological: No new neurological symptoms. Psychiatric: Mood and affect appropriate. Endocrine/Hematologic/Lymphatic: No new bleeding or bruising. All other systems reviewed and are negative unless otherwise stated.  Past Surgical History:  Procedure Laterality Date   BRONCHIAL BIOPSY  03/11/2022   Procedure: BRONCHIAL BIOPSIES;  Surgeon: Omar Person, MD;  Location: [facility] ENDOSCOPY;  Service: Pulmonary;;    BRONCHIAL BIOPSY  11/07/2022   Procedure: BRONCHIAL BIOPSIES;  Surgeon: Omar Person, MD;  Location: [facility] ENDOSCOPY;  Service: Pulmonary;;   BRONCHIAL BRUSHINGS  11/07/2022   Procedure: BRONCHIAL BRUSHINGS;  Surgeon: Omar Person, MD;  Location: [facility] ENDOSCOPY;  Service: Pulmonary;;   BRONCHIAL NEEDLE ASPIRATION BIOPSY  11/09/2017   Procedure: BRONCHIAL NEEDLE ASPIRATION BIOPSIES;  Surgeon: Chilton Greathouse, MD;  Location: [facility] ENDOSCOPY;  Service: Cardiopulmonary;;   BRONCHIAL NEEDLE ASPIRATION BIOPSY  11/07/2022   Procedure: BRONCHIAL NEEDLE ASPIRATION BIOPSIES;  Surgeon: Omar Person, MD;  Location: [facility] ENDOSCOPY;  Service: Pulmonary;;   BRONCHIAL WASHINGS  03/11/2022   Procedure: BRONCHIAL WASHINGS;  Surgeon: Omar Person, MD;  Location[facility] ENDOSCOPY;  Service: Pulmonary;;   CHEST TUBE INSERTION Left 05/13/2021   Procedure: INSERTION PLEURAL DRAINAGE CATHETER;  Surgeon: Corliss Skains, MD;  Location: [facility] OR;  Service: Thoracic;  Laterality: Left;   CHEST TUBE INSERTION Right 03/11/2022   Procedure: INSERTION PLEURAL DRAINAGE CATHETER;  Surgeon: Omar Person, MD;  Location: [facility] ENDOSCOPY;  Service: Pulmonary;  Laterality: Right;  indwelling pleural catheter, w/ cuff   ENDOBRONCHIAL ULTRASOUND Bilateral 11/09/2017   Procedure: ENDOBRONCHIAL ULTRASOUND;  Surgeon: Chilton Greathouse, MD;  Location: [facility] ENDOSCOPY;  Service: Cardiopulmonary;  Laterality: Bilateral;   FIDUCIAL MARKER PLACEMENT  11/07/2022   Procedure: FIDUCIAL MARKER PLACEMENT;  Surgeon: Omar Person, MD;  Location: [facility] ENDOSCOPY;  Service: Pulmonary;;   NASAL SINUS SURGERY     NASAL SINUS SURGERY     THORACENTESIS Right 02/07/2021   Procedure: THORACENTESIS;  Surgeon: Corliss Skains, MD;  Location: [facility] OR;  Service: Thoracic;  Laterality: Right;   TUBAL LIGATION     VIDEO BRONCHOSCOPY  11/09/2017   Procedure: VIDEO BRONCHOSCOPY;  Surgeon: Chilton Greathouse, MD;  Location: [facility] ENDOSCOPY;  Service:  Cardiopulmonary;;   VIDEO BRONCHOSCOPY Left 03/11/2022   Procedure: VIDEO BRONCHOSCOPY WITH FLUORO;  Surgeon: Omar Person, MD;  Location: [facility] ENDOSCOPY;  Service: Pulmonary;  Laterality: Left;   VIDEO BRONCHOSCOPY WITH RADIAL ENDOBRONCHIAL ULTRASOUND  11/07/2022   Procedure: VIDEO BRONCHOSCOPY WITH RADIAL ENDOBRONCHIAL ULTRASOUND;  Surgeon: Omar Person, MD;  Location: [facility] ENDOSCOPY;  Service: Pulmonary;;   XI ROBOTIC ASSISTED PERICARDIAL WINDOW Left 02/07/2021   Procedure: XI ROBOTIC ASSISTED THORACOSCOPY PERICARDIAL WINDOW;  Surgeon: Corliss Skains, MD;  Location: [facility] OR;  Service: Thoracic;  Laterality: Left;    Outpatient Medications Prior to Visit  Medication Sig Dispense Refill   acetaminophen (TYLENOL) 500 MG tablet Take 1,000 mg by mouth every 6 (six) hours as needed for moderate pain.     albuterol (PROVENTIL) (2.5 MG/3ML) 0.083% nebulizer solution Take 3 mLs (2.5 mg total) by nebulization every 4 (four) hours as needed for shortness of breath.     ascorbic acid (VITAMIN C) 500 MG tablet Take 500 mg by mouth daily.     bacitracin 500 UNIT/GM ointment Apply 1 Application topically 2 (two) times daily. 15 g 0   cephALEXin (KEFLEX) 500 MG capsule Take 1 capsule (500 mg total) by mouth 3 (three) times daily for 10 days. 30 capsule 0   Cholecalciferol (VITAMIN D-3) 125 MCG (5000 UT) TABS Take 5,000 Units by mouth daily.     hydroxychloroquine (PLAQUENIL) 200 MG tablet Take 1 tablet (200 mg total) by mouth daily. 90 tablet 0   omeprazole (PRILOSEC) 20 MG capsule Take 20 mg by mouth daily.     apixaban (ELIQUIS) 5 MG TABS tablet Take 1 tablet (5 mg total) by mouth 2 (two) times daily.     furosemide (LASIX) 8 MG/ML solution Take 20 mg by mouth daily.     potassium chloride (KLOR-CON) 10 MEQ tablet Take 10 mEq by mouth daily.     Tiotropium Bromide-Olodaterol (STIOLTO RESPIMAT) 2.5-2.5 MCG/ACT AERS Inhale 2 puffs into the lungs daily. (Patient not taking: Reported on 04/08/2023)  4 g 5   No facility-administered medications  prior to visit.    Family History  Problem Relation Age of Onset   Stroke Mother    Alzheimer's disease Mother    Heart disease Mother    Emphysema Father    Hypertension Brother    Heart attack Maternal Aunt    Heart failure Maternal Grandmother    Hypertension Paternal Grandmother     Social History   Socioeconomic History   Marital status: Divorced    Spouse name: Not on file   Number of children: 1   Years of education: Not on file   Highest education level: Not on file  Occupational History   Not on file  Tobacco Use   Smoking status: Former    Current packs/day: 0.00    Average packs/day: 0.5 packs/day for 45.0 years (22.5 ttl pk-yrs)    Types: Cigarettes    Start date: 09/29/1972    Quit date: 09/29/2017    Years since quitting: 5.5    Passive exposure: Never   Smokeless tobacco: Never  Vaping Use   Vaping status: Never Used  Substance and Sexual Activity   Alcohol use: Not Currently   Drug use: No   Sexual activity: Not Currently    Birth control/protection: Post-menopausal  Other Topics Concern   Not on file  Social History Narrative   Not on file   Social Determinants of Health   Financial Resource Strain: Low Risk  (12/08/2022)   Overall Financial Resource Strain (CARDIA)    Difficulty of Paying Living Expenses: Not hard at all  Food Insecurity: No Food Insecurity (03/09/2023)   Hunger Vital Sign    Worried About Running Out of Food in the Last Year: Never true    Ran Out of Food in the Last Year: Never true  Transportation Needs: No Transportation Needs (03/09/2023)   PRAPARE - Administrator, Civil Service (Medical): No    Lack of Transportation (Non-Medical): No  Physical Activity: Inactive (12/08/2022)   Exercise Vital Sign    Days of Exercise per Week: 0 days    Minutes of Exercise per Session: 0 min  Stress: Stress Concern Present (12/08/2022)   [employer] of Occupational Health  - Occupational Stress Questionnaire    Feeling of Stress : To some extent  Social Connections: Socially Isolated (12/08/2022)   Social Connection and Isolation Panel [NHANES]    Frequency of Communication with Friends and Family: More than three times a week    Frequency of Social Gatherings with Friends and Family: More than three times a week    Attends Religious Services: Never    Database administrator or Organizations: No    Attends Banker Meetings: Never    Marital Status: Divorced  Catering manager Violence: Not At Risk (03/09/2023)   Humiliation, Afraid, Rape, and Kick questionnaire    Fear of Current or Ex-Partner: No    Emotionally Abused: No    Physically Abused: No    Sexually Abused: No  Objective:  Physical Exam: BP 114/64   Pulse (!) 103   Temp (!) 96.1 F (35.6 C)   Ht 5\' 6"  (1.676 m)   LMP 04/28/2000   SpO2 96% Comment: 2L  BMI 25.45 kg/m     Physical Exam Constitutional:      General: She is not in acute distress.    Appearance: She is ill-appearing. She is not toxic-appearing.     Interventions: Nasal cannula in place.     Comments: In wheelchair.  Uses 2 L O2.  HENT:     Head: Normocephalic and atraumatic.     Nose: Nose normal. No congestion.  Eyes:     General: No scleral icterus.    Extraocular Movements: Extraocular movements intact.  Cardiovascular:     Rate and Rhythm: Regular rhythm. Tachycardia present.     Heart sounds: Normal heart sounds.  Pulmonary:     Effort: Pulmonary effort is normal. No respiratory distress.  Abdominal:     General: Abdomen is flat. Bowel sounds are normal.     Palpations: Abdomen is soft.     Tenderness: There is no abdominal tenderness.  Musculoskeletal:        General: Normal range of motion.  Lymphadenopathy:     Cervical: No cervical adenopathy.  Skin:    General: Skin is warm and dry.      Findings: No rash.  Neurological:     General: No focal deficit present.     Mental Status: She is alert and oriented to person, place, and time. Mental status is at baseline.  Psychiatric:        Mood and Affect: Mood normal.        Behavior: Behavior normal.        Thought Content: Thought content normal.        Judgment: Judgment normal.     DG Chest 2 View  Result Date: 04/06/2023 CLINICAL DATA:  Malignant pleural effusion. EXAM: CHEST - 2 VIEW COMPARISON:  03/08/2023 and CT chest 03/09/2023. FINDINGS: Trachea is deviated slightly to the right, unchanged. Heart is enlarged. Increased linear consolidation in the left perihilar region, with an associated fiducial marker, possibly related to radiation therapy. Post radiation scarring in the perihilar right hemithorax. Moderate to large partially loculated right pleural effusion with PleurX catheter in place. IMPRESSION: 1. Increasing linear consolidation in the left perihilar region, likely due to evolving changes of radiation therapy. 2. Moderate to large partially loculated right pleural effusion with PleurX catheter in place, stable. 3. Post radiation scarring in the perihilar right hemithorax. Electronically Signed   By: Leanna Battles M.D.   On: 04/06/2023 16:24   VAS [place] LOWER EXTREMITY VENOUS (DVT)  Result Date: 03/09/2023  Lower Venous DVT Study Patient Name:  Susan Holder  Date of Exam:   03/09/2023 Medical Rec #: [id number]          Accession #:    [id number] Date of Birth: July 03, 1955           Patient Gender: F Patient Age:   61 years Exam Location:  [facility] Procedure:      VAS [place] LOWER EXTREMITY VENOUS (DVT) Referring Phys: Lyda Perone --------------------------------------------------------------------------------  Indications: Pulmonary embolism, SOB, and chest pain.  Risk Factors: Confirmed PE. Comparison Study: No prior study. Performing Technologist: Fernande Bras  Examination Guidelines: A complete  evaluation includes B-mode imaging, spectral Doppler, color Doppler, and power Doppler as needed of all accessible portions of each vessel. Bilateral testing  is considered an integral part of a complete examination. Limited examinations for reoccurring indications may be performed as noted. The reflux portion of the exam is performed with the patient in reverse Trendelenburg.  +---------+---------------+---------+-----------+----------+--------------+ RIGHT    CompressibilityPhasicitySpontaneityPropertiesThrombus Aging +---------+---------------+---------+-----------+----------+--------------+ CFV      Full           Yes      Yes                                 +---------+---------------+---------+-----------+----------+--------------+ SFJ      Full                                                        +---------+---------------+---------+-----------+----------+--------------+ FV Prox  Full                                                        +---------+---------------+---------+-----------+----------+--------------+ FV Mid   Full                                                        +---------+---------------+---------+-----------+----------+--------------+ FV DistalFull                                                        +---------+---------------+---------+-----------+----------+--------------+ PFV      Full                                                        +---------+---------------+---------+-----------+----------+--------------+ POP      Full           Yes      Yes                                 +---------+---------------+---------+-----------+----------+--------------+ PTV      Full                                                        +---------+---------------+---------+-----------+----------+--------------+ PERO     None           No       No                                   +---------+---------------+---------+-----------+----------+--------------+   +---------+---------------+---------+-----------+----------+--------------+ LEFT     CompressibilityPhasicitySpontaneityPropertiesThrombus Aging +---------+---------------+---------+-----------+----------+--------------+ CFV  Full           Yes      Yes                                 +---------+---------------+---------+-----------+----------+--------------+ SFJ      Full                                                        +---------+---------------+---------+-----------+----------+--------------+ FV Prox  Full                                                        +---------+---------------+---------+-----------+----------+--------------+ FV Mid   Full                                                        +---------+---------------+---------+-----------+----------+--------------+ FV DistalFull                                                        +---------+---------------+---------+-----------+----------+--------------+ PFV      Full                                                        +---------+---------------+---------+-----------+----------+--------------+ POP      Full           Yes      Yes                                 +---------+---------------+---------+-----------+----------+--------------+ PTV      Partial        No       No                                  +---------+---------------+---------+-----------+----------+--------------+ PERO     Full                                                        +---------+---------------+---------+-----------+----------+--------------+     Summary: BILATERAL: -No evidence of popliteal cyst, bilaterally. RIGHT: - Findings consistent with acute deep vein thrombosis involving the right peroneal veins.   LEFT: - Findings consistent with acute deep vein thrombosis involving the left posterior tibial veins.    *See table(s) above for measurements and observations. Electronically signed by Gerarda Fraction on 03/09/2023 at 6:13:19 PM.    Final  ECHOCARDIOGRAM COMPLETE  Result Date: 03/09/2023    ECHOCARDIOGRAM REPORT   Patient Name:   Susan Holder Date of Exam: 03/09/2023 Medical Rec #:  [id number]         Height:       66.0 in Accession #:    [id number]        Weight:       151.0 lb Date of Birth:  1956-01-15          BSA:          1.775 m Patient Age:    67 years          BP:           120/69 mmHg Patient Gender: F                 HR:           97 bpm. Exam Location:  Inpatient Procedure: 2D Echo, Cardiac Doppler and Color Doppler Indications:    Pulmonary Embolus I26.09  History:        Patient has prior history of Echocardiogram examinations, most                 recent 11/09/2020. COPD.  Sonographer:    Harriette Bouillon RDCS Referring Phys: [phone] JARED M GARDNER IMPRESSIONS  1. Left ventricular ejection fraction, by estimation, is 45 to 50%. The left ventricle has mildly decreased function. The left ventricle demonstrates global hypokinesis appearing worse at the apex. There is mild concentric left ventricular hypertrophy. Left ventricular diastolic parameters are consistent with Grade I diastolic dysfunction (impaired relaxation).  2. D-shaped septum suggestive of RV pressure/volume overload. Right ventricular systolic function is mildly reduced. The right ventricular size is mildly enlarged. There is mildly elevated pulmonary artery systolic pressure. The estimated right ventricular systolic pressure is 41.9 mmHg.  3. The mitral valve is normal in structure. Trivial mitral valve regurgitation. No evidence of mitral stenosis.  4. The tricuspid valve is abnormal. Tricuspid valve regurgitation is moderate.  5. The aortic valve is tricuspid. Aortic valve regurgitation is moderate. No aortic stenosis is present.  6. The inferior vena cava is normal in size with greater than 50% respiratory variability, suggesting right  atrial pressure of 3 mmHg. FINDINGS  Left Ventricle: Left ventricular ejection fraction, by estimation, is 45 to 50%. The left ventricle has mildly decreased function. The left ventricle demonstrates global hypokinesis. The left ventricular internal cavity size was normal in size. There is  mild concentric left ventricular hypertrophy. Left ventricular diastolic parameters are consistent with Grade I diastolic dysfunction (impaired relaxation). Right Ventricle: D-shaped septum suggestive of RV pressure/volume overload. The right ventricular size is mildly enlarged. No increase in right ventricular wall thickness. Right ventricular systolic function is mildly reduced. There is mildly elevated pulmonary artery systolic pressure. The tricuspid regurgitant velocity is 3.12 m/s, and with an assumed right atrial pressure of 3 mmHg, the estimated right ventricular systolic pressure is 41.9 mmHg. Left Atrium: Left atrial size was normal in size. Right Atrium: Right atrial size was normal in size. Pericardium: There is no evidence of pericardial effusion. Mitral Valve: The mitral valve is normal in structure. Trivial mitral valve regurgitation. No evidence of mitral valve stenosis. Tricuspid Valve: The tricuspid valve is abnormal. Tricuspid valve regurgitation is moderate. Aortic Valve: The aortic valve is tricuspid. Aortic valve regurgitation is moderate. Aortic regurgitation PHT measures 800 msec. No aortic stenosis is present. Pulmonic Valve: The pulmonic valve was normal in structure. Pulmonic valve regurgitation  is not visualized. Aorta: The aortic root is normal in size and structure. Venous: The inferior vena cava is normal in size with greater than 50% respiratory variability, suggesting right atrial pressure of 3 mmHg. IAS/Shunts: No atrial level shunt detected by color flow Doppler.  LEFT VENTRICLE PLAX 2D LVIDd:         3.90 cm   Diastology LVIDs:         2.90 cm   LV e' medial:    9.14 cm/s LV PW:         1.00 cm    LV E/e' medial:  6.8 LV IVS:        1.10 cm   LV e' lateral:   15.60 cm/s LVOT diam:     1.90 cm   LV E/e' lateral: 4.0 LV SV:         31 LV SV Index:   17 LVOT Area:     2.84 cm  RIGHT VENTRICLE            IVC RV S prime:     9.57 cm/s  IVC diam: 1.30 cm TAPSE (M-mode): 1.1 cm LEFT ATRIUM             Index       RIGHT ATRIUM           Index LA diam:        2.50 cm 1.41 cm/m  RA Area:     10.50 cm LA Vol (A2C):   9.4 ml  5.30 ml/m  RA Volume:   21.70 ml  12.23 ml/m LA Vol (A4C):   10.5 ml 5.92 ml/m LA Biplane Vol: 9.9 ml  5.60 ml/m  AORTIC VALVE LVOT Vmax:   65.50 cm/s LVOT Vmean:  44.600 cm/s LVOT VTI:    0.108 m AI PHT:      800 msec  AORTA Ao Root diam: 2.80 cm Ao Asc diam:  2.80 cm MITRAL VALVE               TRICUSPID VALVE MV Area (PHT): 3.83 cm    TR Peak grad:   38.9 mmHg MV Decel Time: 198 msec    TR Vmax:        312.00 cm/s MV E velocity: 62.40 cm/s MV A velocity: 73.40 cm/s  SHUNTS MV E/A ratio:  0.85        Systemic VTI:  0.11 m                            Systemic Diam: 1.90 cm Dalton McleanMD Electronically signed by Wilfred Lacy Signature Date/Time: 03/09/2023/9:25:05 AM    Final    CT Angio Chest PE W and/or Wo Contrast  Result Date: 03/09/2023 CLINICAL DATA:  67 year old female with history of acute onset of chest pain. Evaluate for pulmonary embolism. History of lung cancer. * Tracking Code: BO * EXAM: CT ANGIOGRAPHY CHEST WITH CONTRAST TECHNIQUE: Multidetector CT imaging of the chest was performed using the standard protocol during bolus administration of intravenous contrast. Multiplanar CT image reconstructions and MIPs were obtained to evaluate the vascular anatomy. RADIATION DOSE REDUCTION: This exam was performed according to the departmental dose-optimization program which includes automated exposure control, adjustment of the mA and/or kV according to patient size and/or use of iterative reconstruction technique. CONTRAST:  75mL OMNIPAQUE IOHEXOL 350 MG/ML SOLN COMPARISON:   Chest CT 02/11/2023. FINDINGS: Cardiovascular: There are multiple filling defects in the left-sided pulmonary arterial tree  involving segmental and subsegmental sized pulmonary artery branches to the left upper lobe, indicative of pulmonary embolism. These appear nonocclusive at this time. No definite right-sided filling defects are noted. Heart size is normal. There is no significant pericardial fluid, thickening or pericardial calcification. Atherosclerotic calcifications in the thoracic aorta as well as the left main, left anterior descending and right coronary arteries. Mediastinum/Nodes: Prominent nodal tissue is noted measuring up to 1.4 cm in short axis in the subcarinal region. Architectural distortion in the right hilar region noted. No definite discrete hilar lymphadenopathy confidently identified. Esophagus is unremarkable in appearance. No axillary lymphadenopathy. Lungs/Pleura: Mass-like architectural distortion in the perihilar aspect of the right lung, likely reflective of chronic postradiation mass-like fibrosis. Right-sided chest tube in position with tip extending into the posterior aspect of the upper right hemithorax within a large right-sided pleural effusion which has some associated pleural enhancement. In the posterior aspect of the left upper lobe (axial image 53 of series 10) there is a 9 x 9 mm nodule adjacent to a fiducial marker, corresponding to the partially treated neoplasm. Areas of ground-glass attenuation and septal thickening are noted in the surrounding lung, likely to reflect evolving postradiation pneumonitis. No left pleural effusion. No other new suspicious appearing pulmonary nodules or masses are noted. Diffuse bronchial wall thickening with moderate centrilobular and mild paraseptal emphysema. Upper Abdomen: Aortic atherosclerosis. Musculoskeletal: There are no aggressive appearing lytic or blastic lesions noted in the visualized portions of the skeleton. Review of the MIP  images confirms the above findings. IMPRESSION: 1. Study is positive for pulmonary embolism with multiple nonocclusive segmental and subsegmental sized filling defects to the left upper lobe, as above. 2. Right-sided chest tube stable in position with tip in the posterior aspect of the upper right hemithorax. Large chronic right pleural effusion with pleural enhancement noted. 3. Treated nodule in the posterior aspect of the left upper lobe again noted, with evolving postradiation changes in the surrounding lung parenchyma. 4. Diffuse bronchial wall thickening with moderate centrilobular and mild paraseptal emphysema; imaging findings suggestive of underlying COPD. 5. Aortic atherosclerosis, in addition to left main and 2 vessel coronary artery disease. Please note that although the presence of coronary artery calcium documents the presence of coronary artery disease, the severity of this disease and any potential stenosis cannot be assessed on this non-gated CT examination. Assessment for potential risk factor modification, dietary therapy or pharmacologic therapy may be warranted, if clinically indicated. Critical Value/emergent results were discussed by telephone at the time of interpretation on 03/09/2023 at 5:44 am to provider Dr. Julian Reil, who verbally acknowledged these results. Aortic Atherosclerosis (ICD10-I70.0) and Emphysema (ICD10-J43.9). Electronically Signed   By: Trudie Reed M.D.   On: 03/09/2023 05:44   DG Chest Portable 1 View  Result Date: 03/08/2023 CLINICAL DATA:  Chest pain and shortness of breath EXAM: PORTABLE CHEST 1 VIEW COMPARISON:  Radiograph 01/22/2023 and CT 02/11/2023 FINDINGS: Right chest tube in the moderate-to-large partially loculated right pleural effusion which is grossly similar to CT chest 02/11/2023. Associated airspace and interstitial opacities the right mid and lower lung. Interstitial thickening in the left mid and lower lung. Small left pleural effusion. No  pneumothorax. IMPRESSION: 1. Right chest tube in the moderate-to-large partially loculated right pleural effusion which is grossly similar to CT chest 02/11/2023. 2. Airspace and interstitial opacities in the bilateral mid and lower lungs are slightly increased compared to radiographs 01/22/2023 and are suspicious for edema or infection. Electronically Signed   By: Minerva Fester  M.D.   On: 03/08/2023 23:39   MR Brain W Wo Contrast  Result Date: 02/25/2023 CLINICAL DATA:  Brain metastases, assess treatment response 3T SRS Protocol. Short interval scan after course of steroids. EXAM: MRI HEAD WITHOUT AND WITH CONTRAST TECHNIQUE: Multiplanar, multiecho pulse sequences of the brain and surrounding structures were obtained without and with intravenous contrast. CONTRAST:  7.5 mL Vueway. COMPARISON:  None Available. FINDINGS: BRAIN New Lesions: None. Larger lesions: None. Stable or Smaller lesions: *Unchanged 15 x 14 mm enhancing lesion in the medial inferior aspect of the left cerebellar hemisphere (axial image 122 series 100). Slightly decreased surrounding vasogenic edema with decreased mass effect on the fourth ventricle (axial image 25 series 10). *Unchanged cluster of smaller foci of enhancement in the lateral aspect of the inferior left cerebellar hemisphere (axial images 116-121 series 100). *Unchanged 8 x 7 mm enhancing lesion in the anterior perforated substance of the basal right frontal lobe (axial image 89 series 100). Slightly decreased surrounding vasogenic edema (axial image 35 series 10). Other Brain findings: No acute infarct or hemorrhage. Stable background of moderate chronic small-vessel disease. No hydrocephalus, extra-axial collection, or midline shift. Vascular: Normal flow voids and vessel enhancement. Skull and upper cervical spine: Normal marrow signal and enhancement. Sinuses/Orbits: No acute findings. Other: None. IMPRESSION: Unchanged size of enhancing lesions in the left cerebellar  hemisphere and basal right frontal lobe with slightly decreased surrounding vasogenic edema. No new lesions. Electronically Signed   By: Orvan Falconer M.D.   On: 02/25/2023 11:56   CT CHEST ABDOMEN PELVIS W CONTRAST  Result Date: 02/17/2023 CLINICAL DATA:  Lung cancer restaging * Tracking Code: BO * EXAM: CT CHEST, ABDOMEN, AND PELVIS WITH CONTRAST TECHNIQUE: Multidetector CT imaging of the chest, abdomen and pelvis was performed following the standard protocol during bolus administration of intravenous contrast. RADIATION DOSE REDUCTION: This exam was performed according to the departmental dose-optimization program which includes automated exposure control, adjustment of the mA and/or kV according to patient size and/or use of iterative reconstruction technique. CONTRAST:  OMNIPAQUE IOHEXOL 300 MG/ML  SOLN COMPARISON:  PET-CT, 11/06/2022 FINDINGS: CT CHEST FINDINGS Cardiovascular: Aortic atherosclerosis. Normal heart size. Left and right coronary artery calcifications. No pericardial effusion. Mediastinum/Nodes: Unchanged soft tissue thickening about the right hilum (series 2, image 23). No discretely enlarged mediastinal, hilar, or axillary lymph nodes. Thyroid gland, trachea, and esophagus demonstrate no significant findings. Lungs/Pleura: Similar appearance of a moderate, loculated right hydropneumothorax containing tunneled pleural drainage catheter. Unchanged post treatment appearance of the right lung with dense perihilar and right lower lobe fibrosis and consolidation. Slightly diminished size of a previously FDG avid spiculated nodule of the posterior left upper lobe, which tents the adjacent fissure and measures 0.9 x 0.7 cm, previously 1.2 x 0.8 cm when measured similarly (series 4, image 60). Interval placement of biopsy marking clip adjacent to this nodule (series 4, image 63). Substantially diminished solid character of a previously FDG avid consolidation of the anterior subpleural left  upper lobe (series 4, image 64). Moderate underlying centrilobular emphysema. Scarring of the dependent left lower lobe. Musculoskeletal: No chest wall abnormality. No acute osseous findings. CT ABDOMEN PELVIS FINDINGS Hepatobiliary: No solid liver abnormality is seen. No gallstones, gallbladder wall thickening, or biliary dilatation. Pancreas: Unremarkable. No pancreatic ductal dilatation or surrounding inflammatory changes. Spleen: Normal in size without significant abnormality. Adrenals/Urinary Tract: Adrenal glands are unremarkable. Kidneys are normal, without renal calculi, solid lesion, or hydronephrosis. Bladder is unremarkable. Stomach/Bowel: Stomach is within normal limits. Appendix  not clearly visualized. No evidence of bowel wall thickening, distention, or inflammatory changes. Sigmoid diverticulosis. Vascular/Lymphatic: Aortic atherosclerosis. No enlarged abdominal or pelvic lymph nodes. Reproductive: No mass or other abnormality. Other: No abdominal wall hernia or abnormality. No ascites. Musculoskeletal: No acute osseous findings. IMPRESSION: 1. Slightly diminished size of a previously FDG avid spiculated nodule of the posterior left upper lobe, consistent with treatment response. Interval placement of biopsy marking clip adjacent to this nodule. 2. Substantially diminished solid character of a previously FDG avid consolidation of the anterior subpleural left upper lobe, likely reflecting radiation pneumonitis. Continued attention on follow-up. 3. Unchanged post treatment/post radiation appearance of the right lung with dense perihilar and right lower lobe radiation fibrosis and consolidation. Unchanged soft tissue thickening about the right hilum. 4. No evidence of lymphadenopathy or metastatic disease in the abdomen or pelvis. 5. Similar appearance of a moderate, loculated right hydropneumothorax containing tunneled pleural drainage catheter. 6. Coronary artery disease. Aortic Atherosclerosis  (ICD10-I70.0) and Emphysema (ICD10-J43.9). Electronically Signed   By: Jearld Lesch M.D.   On: 02/17/2023 08:56   DG Chest 2 View  Result Date: 02/11/2023 CLINICAL DATA:  Follow-up right pleural effusion. EXAM: CHEST - 2 VIEW COMPARISON:  11/07/2022 FINDINGS: A small right hydropneumothorax shows no significant change in size. Right pleural catheter remains in position. Atelectasis or scarring is seen in the right lower lung, also unchanged. Left lung remains clear. Heart size is within normal limits IMPRESSION: No significant change in size of small right hydropneumothorax, and right lower lung atelectasis versus scarring. Right pleural catheter remains in position. Electronically Signed   By: Danae Orleans M.D.   On: 02/11/2023 07:15    Recent Results (from the past 2160 hour(s))  CMP (Cancer Center only)     Status: Abnormal   Collection Time: 02/11/23  8:56 AM  Result Value Ref Range   Sodium 138 135 - 145 mmol/L   Potassium 4.2 3.5 - 5.1 mmol/L   Chloride 101 98 - 111 mmol/L   CO2 28 22 - 32 mmol/L   Glucose, Bld 115 (H) 70 - 99 mg/dL    Comment: Glucose reference range applies only to samples taken after fasting for at least 8 hours.   BUN 31 (H) 8 - 23 mg/dL   Creatinine 1.61 (H) 0.96 - 1.00 mg/dL   Calcium 9.1 8.9 - 04.5 mg/dL   Total Protein 6.9 6.5 - 8.1 g/dL   Albumin 3.3 (L) 3.5 - 5.0 g/dL   AST 22 15 - 41 U/L   ALT 20 0 - 44 U/L   Alkaline Phosphatase 95 38 - 126 U/L   Total Bilirubin 0.4 0.3 - 1.2 mg/dL   GFR, Estimated 51 (L) >60 mL/min    Comment: (NOTE) Calculated using the CKD-EPI Creatinine Equation (2021)    Anion gap 9 5 - 15    Comment: Performed at [facility], [address] [place], [place] [address]  CBC with Differential (Cancer Center Only)     Status: Abnormal   Collection Time: 02/11/23  8:56 AM  Result Value Ref Range   WBC Count 8.1 4.0 - 10.5 K/uL   RBC 3.85 (L) 3.87 - 5.11 MIL/uL   Hemoglobin 11.5 (L) 12.0 - 15.0 g/dL   HCT  19.1 47.8 - 29.5 %   MCV 97.1 80.0 - 100.0 fL   MCH 29.9 26.0 - 34.0 pg   MCHC 30.7 30.0 - 36.0 g/dL   RDW 62.1 (H) 30.8 - 65.7 %  Platelet Count 262 150 - 400 K/uL   nRBC 0.0 0.0 - 0.2 %   Neutrophils Relative % 89 %   Neutro Abs 7.2 1.7 - 7.7 K/uL   Lymphocytes Relative 3 %   Lymphs Abs 0.2 (L) 0.7 - 4.0 K/uL   Monocytes Relative 6 %   Monocytes Absolute 0.5 0.1 - 1.0 K/uL   Eosinophils Relative 0 %   Eosinophils Absolute 0.0 0.0 - 0.5 K/uL   Basophils Relative 0 %   Basophils Absolute 0.0 0.0 - 0.1 K/uL   Immature Granulocytes 2 %   Abs Immature Granulocytes 0.13 (H) 0.00 - 0.07 K/uL    Comment: Performed at [facility], [address], [place], [place] [address]  Basic metabolic panel     Status: Abnormal   Collection Time: 03/08/23  9:57 PM  Result Value Ref Range   Sodium 138 135 - 145 mmol/L   Potassium 3.8 3.5 - 5.1 mmol/L   Chloride 101 98 - 111 mmol/L   CO2 27 22 - 32 mmol/L   Glucose, Bld 108 (H) 70 - 99 mg/dL    Comment: Glucose reference range applies only to samples taken after fasting for at least 8 hours.   BUN 15 8 - 23 mg/dL   Creatinine, Ser 2.13 0.44 - 1.00 mg/dL   Calcium 9.7 8.9 - 08.6 mg/dL   GFR, Estimated >57 >84 mL/min    Comment: (NOTE) Calculated using the CKD-EPI Creatinine Equation (2021)    Anion gap 10 5 - 15    Comment: Performed at [facility], [address], [place], [place] [address]  CBC     Status: Abnormal   Collection Time: 03/08/23  9:57 PM  Result Value Ref Range   WBC 8.5 4.0 - 10.5 K/uL   RBC 3.76 (L) 3.87 - 5.11 MIL/uL   Hemoglobin 11.1 (L) 12.0 - 15.0 g/dL   HCT 52.8 41.3 - 24.4 %   MCV 97.6 80.0 - 100.0 fL   MCH 29.5 26.0 - 34.0 pg   MCHC 30.2 30.0 - 36.0 g/dL   RDW 01.0 27.2 - 53.6 %   Platelets 332 150 - 400 K/uL   nRBC 0.0 0.0 - 0.2 %    Comment: Performed at [facility], [address] [place], [place] [address]  Troponin I (High Sensitivity)     Status: Abnormal    Collection Time: 03/08/23  9:57 PM  Result Value Ref Range   Troponin I (High Sensitivity) 381 (HH) <18 ng/L    Comment: CRITICAL RESULT CALLED TO, READ BACK BY AND VERIFIED WITH CHRISCO, C. RN @ 2245 03/08/23 JBUTLER (NOTE) Elevated high sensitivity troponin I (hsTnI) values and significant  changes across serial measurements may suggest ACS but many other  chronic and acute conditions are known to elevate hsTnI results.  Refer to the "Links" section for chest pain algorithms and additional  guidance. Performed at [facility], [address], [place], [place] [address]   Troponin I (High Sensitivity)     Status: Abnormal   Collection Time: 03/08/23 11:55 PM  Result Value Ref Range   Troponin I (High Sensitivity) 380 (HH) <18 ng/L    Comment: CRITICAL VALUE NOTED. VALUE IS CONSISTENT WITH PREVIOUSLY REPORTED/CALLED VALUE (NOTE) Elevated high sensitivity troponin I (hsTnI) values and significant  changes across serial measurements may suggest ACS but many other  chronic and acute conditions are known to elevate hsTnI results.  Refer to the "  Links" section for chest pain algorithms and additional  guidance. Performed at [facility], [address], [place], [place] [address]   ECHOCARDIOGRAM COMPLETE     Status: None   Collection Time: 03/09/23  7:55 AM  Result Value Ref Range   Weight 2,416 oz   Height 66 in   BP 120/69 mmHg   S' Lateral 2.90 cm   P 1/2 time 800 msec   Area-P 1/2 3.83 cm2   Est EF 45 - 50%   HIV Antibody (routine testing w rflx)     Status: None   Collection Time: 03/09/23  8:29 AM  Result Value Ref Range   HIV Screen 4th Generation wRfx Non Reactive Non Reactive    Comment: Performed at [facility], [address], [place], [place] [address]  Troponin I (High Sensitivity)     Status: Abnormal   Collection Time: 03/09/23  8:29 AM  Result Value Ref Range   Troponin I (High Sensitivity) 427 (HH) <18 ng/L    Comment: CRITICAL VALUE  NOTED. VALUE IS CONSISTENT WITH PREVIOUSLY REPORTED/CALLED VALUE (NOTE) Elevated high sensitivity troponin I (hsTnI) values and significant  changes across serial measurements may suggest ACS but many other  chronic and acute conditions are known to elevate hsTnI results.  Refer to the "Links" section for chest pain algorithms and additional  guidance. Performed at [facility], [address], [place], [place] [address]   Heparin level (unfractionated)     Status: Abnormal   Collection Time: 03/09/23  1:54 PM  Result Value Ref Range   Heparin Unfractionated >1.10 (H) 0.30 - 0.70 IU/mL    Comment: (NOTE) The clinical reportable range upper limit is being lowered to >1.10 to align with the FDA approved guidance for the current laboratory assay.  If heparin results are below expected values, and patient dosage has  been confirmed, suggest follow up testing of antithrombin III levels. Performed at [facility], [address] [place], [place] [address]   Troponin I (High Sensitivity)     Status: Abnormal   Collection Time: 03/09/23  5:08 PM  Result Value Ref Range   Troponin I (High Sensitivity) 694 (HH) <18 ng/L    Comment: CRITICAL VALUE NOTED. VALUE IS CONSISTENT WITH PREVIOUSLY REPORTED/CALLED VALUE (NOTE) Elevated high sensitivity troponin I (hsTnI) values and significant  changes across serial measurements may suggest ACS but many other  chronic and acute conditions are known to elevate hsTnI results.  Refer to the "Links" section for chest pain algorithms and additional  guidance. Performed at [facility], [address] [place], [place] [address]   Heparin level (unfractionated)     Status: Abnormal   Collection Time: 03/09/23 10:51 PM  Result Value Ref Range   Heparin Unfractionated 0.98 (H) 0.30 - 0.70 IU/mL    Comment: (NOTE) The clinical reportable range upper limit is being lowered to >1.10 to align with the FDA approved guidance for the  current laboratory assay.  If heparin results are below expected values, and patient dosage has  been confirmed, suggest follow up testing of antithrombin III levels. Performed at [facility], [address], [place], [place] [address]   CBC     Status: Abnormal   Collection Time: 03/10/23  3:45 AM  Result Value Ref Range   WBC 8.8 4.0 - 10.5 K/uL   RBC 3.50 (L) 3.87 - 5.11 MIL/uL   Hemoglobin 10.4 (L) 12.0 -  15.0 g/dL   HCT 78.2 (L) 95.6 - 21.3 %   MCV 97.1 80.0 - 100.0 fL   MCH 29.7 26.0 - 34.0 pg   MCHC 30.6 30.0 - 36.0 g/dL   RDW 08.6 57.8 - 46.9 %   Platelets 341 150 - 400 K/uL   nRBC 0.0 0.0 - 0.2 %    Comment: Performed at [facility], [address] [place], [place] [address]  Basic metabolic panel     Status: Abnormal   Collection Time: 03/10/23  3:45 AM  Result Value Ref Range   Sodium 137 135 - 145 mmol/L   Potassium 4.0 3.5 - 5.1 mmol/L   Chloride 100 98 - 111 mmol/L   CO2 28 22 - 32 mmol/L   Glucose, Bld 96 70 - 99 mg/dL    Comment: Glucose reference range applies only to samples taken after fasting for at least 8 hours.   BUN 15 8 - 23 mg/dL   Creatinine, Ser 8.41 (H) 0.44 - 1.00 mg/dL   Calcium 32.4 8.9 - 40.1 mg/dL   GFR, Estimated 48 (L) >60 mL/min    Comment: (NOTE) Calculated using the CKD-EPI Creatinine Equation (2021)    Anion gap 9 5 - 15    Comment: Performed at [facility], [address], [place], [place] [address]  Heparin level (unfractionated)     Status: None   Collection Time: 03/10/23  3:45 AM  Result Value Ref Range   Heparin Unfractionated 0.55 0.30 - 0.70 IU/mL    Comment: (NOTE) The clinical reportable range upper limit is being lowered to >1.10 to align with the FDA approved guidance for the current laboratory assay.  If heparin results are below expected values, and patient dosage has  been confirmed, suggest follow up testing of antithrombin III levels. Performed at [facility], [address],  [place], [place] [address]   Heparin level (unfractionated)     Status: None   Collection Time: 03/10/23 10:16 AM  Result Value Ref Range   Heparin Unfractionated 0.57 0.30 - 0.70 IU/mL    Comment: (NOTE) The clinical reportable range upper limit is being lowered to >1.10 to align with the FDA approved guidance for the current laboratory assay.  If heparin results are below expected values, and patient dosage has  been confirmed, suggest follow up testing of antithrombin III levels. Performed at [facility], [address], [place], [place] [address]   CBC     Status: Abnormal   Collection Time: 03/11/23  3:10 AM  Result Value Ref Range   WBC 13.9 (H) 4.0 - 10.5 K/uL   RBC 3.44 (L) 3.87 - 5.11 MIL/uL   Hemoglobin 10.2 (L) 12.0 - 15.0 g/dL   HCT 25.9 (L) 56.3 - 87.5 %   MCV 95.6 80.0 - 100.0 fL   MCH 29.7 26.0 - 34.0 pg   MCHC 31.0 30.0 - 36.0 g/dL   RDW 64.3 32.9 - 51.8 %   Platelets 299 150 - 400 K/uL   nRBC 0.0 0.0 - 0.2 %    Comment: Performed at [facility], [address], [place], [place] [address]        Garner Nash, MD, MS

## 2023-04-08 NOTE — Assessment & Plan Note (Addendum)
Managed with Lasix 20 mg daily Difficulty scheduling cardiology appointment due to pending echocardiogram; previous echo canceled.  Plan: Order echocardiogram stat to expedite cardiology evaluation. Facilitate referral to cardiology in [facility] [place]. Order basic metabolic panel to monitor electrolytes and renal function. Refill furosemide and potassium chloride as prescribed.

## 2023-04-08 NOTE — Assessment & Plan Note (Signed)
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 Catheter has stopped draining; infection at the site improved with cephalexin.  Plan: Continue cephalexin as prescribed. Follow up with pulmonology regarding catheter management--possible removal or replacement.

## 2023-04-08 NOTE — Assessment & Plan Note (Addendum)
Oxygen-dependent at 2 L/min; increases flow rate with activity due to dyspnea. Continue oxygen therapy as needed. Monitor respiratory status. Continue regular follow-up with pulmonology and oncology.

## 2023-04-09 DIAGNOSIS — J9621 Acute and chronic respiratory failure with hypoxia: Secondary | ICD-10-CM | POA: Diagnosis not present

## 2023-04-09 DIAGNOSIS — C770 Secondary and unspecified malignant neoplasm of lymph nodes of head, face and neck: Secondary | ICD-10-CM | POA: Diagnosis not present

## 2023-04-09 DIAGNOSIS — F909 Attention-deficit hyperactivity disorder, unspecified type: Secondary | ICD-10-CM

## 2023-04-09 DIAGNOSIS — J449 Chronic obstructive pulmonary disease, unspecified: Secondary | ICD-10-CM | POA: Diagnosis not present

## 2023-04-09 DIAGNOSIS — D630 Anemia in neoplastic disease: Secondary | ICD-10-CM | POA: Diagnosis not present

## 2023-04-09 DIAGNOSIS — C3491 Malignant neoplasm of unspecified part of right bronchus or lung: Secondary | ICD-10-CM | POA: Diagnosis not present

## 2023-04-09 DIAGNOSIS — I700 Atherosclerosis of aorta: Secondary | ICD-10-CM

## 2023-04-09 DIAGNOSIS — M519 Unspecified thoracic, thoracolumbar and lumbosacral intervertebral disc disorder: Secondary | ICD-10-CM

## 2023-04-09 DIAGNOSIS — C7931 Secondary malignant neoplasm of brain: Secondary | ICD-10-CM | POA: Diagnosis not present

## 2023-04-09 DIAGNOSIS — M069 Rheumatoid arthritis, unspecified: Secondary | ICD-10-CM | POA: Diagnosis not present

## 2023-04-09 DIAGNOSIS — I2699 Other pulmonary embolism without acute cor pulmonale: Secondary | ICD-10-CM | POA: Diagnosis not present

## 2023-04-09 DIAGNOSIS — I824Z3 Acute embolism and thrombosis of unspecified deep veins of distal lower extremity, bilateral: Secondary | ICD-10-CM | POA: Diagnosis not present

## 2023-04-10 DIAGNOSIS — I824Z3 Acute embolism and thrombosis of unspecified deep veins of distal lower extremity, bilateral: Secondary | ICD-10-CM | POA: Diagnosis not present

## 2023-04-10 DIAGNOSIS — C770 Secondary and unspecified malignant neoplasm of lymph nodes of head, face and neck: Secondary | ICD-10-CM | POA: Diagnosis not present

## 2023-04-10 DIAGNOSIS — M069 Rheumatoid arthritis, unspecified: Secondary | ICD-10-CM | POA: Diagnosis not present

## 2023-04-10 DIAGNOSIS — D630 Anemia in neoplastic disease: Secondary | ICD-10-CM | POA: Diagnosis not present

## 2023-04-10 DIAGNOSIS — I2699 Other pulmonary embolism without acute cor pulmonale: Secondary | ICD-10-CM | POA: Diagnosis not present

## 2023-04-10 DIAGNOSIS — J9621 Acute and chronic respiratory failure with hypoxia: Secondary | ICD-10-CM | POA: Diagnosis not present

## 2023-04-10 DIAGNOSIS — C7931 Secondary malignant neoplasm of brain: Secondary | ICD-10-CM | POA: Diagnosis not present

## 2023-04-10 DIAGNOSIS — J449 Chronic obstructive pulmonary disease, unspecified: Secondary | ICD-10-CM | POA: Diagnosis not present

## 2023-04-10 DIAGNOSIS — C3491 Malignant neoplasm of unspecified part of right bronchus or lung: Secondary | ICD-10-CM | POA: Diagnosis not present

## 2023-04-13 ENCOUNTER — Telehealth: Payer: Self-pay | Admitting: Emergency Medicine

## 2023-04-13 NOTE — Telephone Encounter (Signed)
PLease get her an OV w RB or APP to discuss

## 2023-04-13 NOTE — Telephone Encounter (Signed)
Spoke with patient and advised message has been sent to RB for recommendations; she is aware we will contact her once we have received a response from him.

## 2023-04-13 NOTE — Telephone Encounter (Signed)
Patient previously saw Dr.Alva. The infection has clear up and nothing is coming out of the plerx tube.

## 2023-04-14 ENCOUNTER — Ambulatory Visit: Payer: Medicare PPO | Admitting: Nurse Practitioner

## 2023-04-14 ENCOUNTER — Encounter: Payer: Self-pay | Admitting: Nurse Practitioner

## 2023-04-14 ENCOUNTER — Ambulatory Visit (INDEPENDENT_AMBULATORY_CARE_PROVIDER_SITE_OTHER): Payer: Medicare PPO

## 2023-04-14 VITALS — BP 110/70 | HR 107 | Ht 63.0 in | Wt 158.4 lb

## 2023-04-14 DIAGNOSIS — J90 Pleural effusion, not elsewhere classified: Secondary | ICD-10-CM | POA: Diagnosis not present

## 2023-04-14 DIAGNOSIS — J069 Acute upper respiratory infection, unspecified: Secondary | ICD-10-CM | POA: Diagnosis not present

## 2023-04-14 DIAGNOSIS — J441 Chronic obstructive pulmonary disease with (acute) exacerbation: Secondary | ICD-10-CM | POA: Diagnosis not present

## 2023-04-14 DIAGNOSIS — L089 Local infection of the skin and subcutaneous tissue, unspecified: Secondary | ICD-10-CM

## 2023-04-14 DIAGNOSIS — R059 Cough, unspecified: Secondary | ICD-10-CM | POA: Diagnosis not present

## 2023-04-14 MED ORDER — FLUTICASONE PROPIONATE 50 MCG/ACT NA SUSP: 1.0000 | Freq: Every day | NASAL | 2 refills | Status: DC

## 2023-04-14 MED ORDER — BENZONATATE 200 MG PO CAPS: 200.0000 mg | ORAL_CAPSULE | Freq: Three times a day (TID) | ORAL | 1 refills | Status: DC | PRN

## 2023-04-14 MED ORDER — PREDNISONE 20 MG PO TABS: 20.0000 mg | ORAL_TABLET | Freq: Every day | ORAL | 0 refills | Status: AC

## 2023-04-14 MED ORDER — CEPHALEXIN 500 MG PO CAPS: 500.0000 mg | ORAL_CAPSULE | Freq: Three times a day (TID) | ORAL | 0 refills | Status: DC

## 2023-04-14 MED ORDER — PROMETHAZINE-DM 6.25-15 MG/5ML PO SYRP: 5.0000 mL | ORAL_SOLUTION | Freq: Four times a day (QID) | ORAL | 0 refills | Status: DC | PRN

## 2023-04-14 NOTE — Progress Notes (Unsigned)
@Patient  ID: Susan Holder, female    DOB: [birth year], 67 y.o.   MRN: [id number]  Chief Complaint  Patient presents with   Follow-up    Pleurex Catheter. Is not working and had an infection last week.    Referring provider: [name], MD  HPI:   TEST/EVENTS:   No Known Allergies  Immunization History  Administered Date(s) Administered   Covid-19 Iv Non-us Vaccine (Bibp, [employer]oph[employer]) 12/28/2022   Influenza, High Dose Seasonal PF 01/22/2019   Influenza,inj,Quad PF,6+ Mos 03/08/2018   Influenza-Unspecified 02/08/2020, 02/16/2022, 12/29/2022   PFIZER(Purple Top)SARS-COV-2 Vaccination 06/02/2019, 06/23/2019, 12/24/2019    Past Medical History:  Diagnosis Date   Acid reflux 09/21/2018   Adenocarcinoma of right lung, stage 4 (HCC) 11/17/2017   ADHD (attention deficit hyperactivity disorder)    patient denies this dx as of 03/10/22   Allergic rhinitis 08/22/2013   ANA positive 05/29/2016   Anemia    Aortic atherosclerosis (HCC) 10/23/2020   Brain metastases 03/29/2019   COPD (chronic obstructive pulmonary disease) (HCC)    Coronary artery calcification seen on CT scan 10/23/2020   patient is unaware of this dx as of 03/10/22   DDD (degenerative disc disease), cervical 05/29/2016   patient denies this dx as of 03/10/22   Dyspnea    oxygen 2L via [place]   Encounter for antineoplastic chemotherapy 11/17/2017   Encounter for antineoplastic immunotherapy 02/01/2018   Goals of care, counseling/discussion 11/17/2017   High risk medication use 05/29/2016   04/29/2016: ==> plq 200 am & 100 qhs(adeq response).   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   Other fatigue 05/29/2016   Oxygen dependent    2L via [place]   Pericardial effusion 10/23/2020    Pneumonia    as a child   Primary malignant neoplasm of bronchus of right lower lobe (HCC) 12/21/2017   Rheumatoid arthritis (HCC)    Rheumatoid arthritis involving multiple sites with positive rheumatoid factor (HCC) 05/29/2016   +RF +ANA +CCP    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   Smoker 05/29/2016   quit 2019   Trigger finger, left ring finger 05/29/2016   Trigger finger, right ring finger 05/29/2016   Vitamin D deficiency 05/29/2016    Tobacco History: Social History   Tobacco Use  Smoking Status Former   Current packs/day: 0.00   Average packs/day: 0.5 packs/day for 45.0 years (22.5 ttl pk-yrs)   Types: Cigarettes   Start date: 09/29/1972   Quit date: 09/29/2017   Years since quitting: 5.5   Passive exposure: Never  Smokeless Tobacco Never   Counseling given: Not Answered   Outpatient Medications Prior to Visit  Medication Sig Dispense Refill   acetaminophen (TYLENOL) 500 MG tablet Take 1,000 mg by mouth every 6 (six) hours as needed for moderate pain.     albuterol (PROVENTIL) (2.5 MG/3ML) 0.083% nebulizer solution Take 3 mLs (2.5 mg total) by nebulization every 4 (four) hours as needed for shortness of breath.     apixaban (ELIQUIS) 5 MG TABS tablet Take 1 tablet (5 mg total) by mouth 2 (two) times daily. 180 tablet 3   ascorbic acid (VITAMIN C) 500 MG tablet Take 500 mg by mouth daily.  bacitracin 500 UNIT/GM ointment Apply 1 Application topically 2 (two) times daily. 15 g 0   cephALEXin (KEFLEX) 500 MG capsule Take 1 capsule (500 mg total) by mouth 3 (three) times daily for 10 days. 30 capsule 0   Cholecalciferol (VITAMIN D-3) 125 MCG (5000 UT) TABS Take 5,000 Units by mouth daily.     furosemide (LASIX) 20 MG tablet Take 1 tablet (20 mg total) by mouth daily. 90 tablet 3   hydroxychloroquine (PLAQUENIL) 200 MG tablet Take 1 tablet (200 mg total) by mouth daily. 90 tablet 0   omeprazole (PRILOSEC) 20 MG capsule Take 20 mg by mouth daily.     potassium  chloride (KLOR-CON) 10 MEQ tablet Take 1 tablet (10 mEq total) by mouth daily. 90 tablet 3   Tiotropium Bromide-Olodaterol (STIOLTO RESPIMAT) 2.5-2.5 MCG/ACT AERS Inhale 2 puffs into the lungs daily. 4 g 5   No facility-administered medications prior to visit.     Review of Systems:   Constitutional: No weight loss or gain, night sweats, fevers, chills, fatigue, or lassitude. HEENT: No headaches, difficulty swallowing, tooth/dental problems, or sore throat. No sneezing, itching, ear ache, nasal congestion, or post nasal drip CV:  No chest pain, orthopnea, PND, swelling in lower extremities, anasarca, dizziness, palpitations, syncope Resp: No shortness of breath with exertion or at rest. No excess mucus or change in color of mucus. No productive or non-productive. No hemoptysis. No wheezing.  No chest wall deformity GI:  No heartburn, indigestion, abdominal pain, nausea, vomiting, diarrhea, change in bowel habits, loss of appetite, bloody stools.  GU: No dysuria, change in color of urine, urgency or frequency.  No flank pain, no hematuria  Skin: No rash, lesions, ulcerations MSK:  No joint pain or swelling.  No decreased range of motion.  No back pain. Neuro: No dizziness or lightheadedness.  Psych: No depression or anxiety. Mood stable.     Physical Exam:  BP 110/70 (BP Location: Right Arm, Patient Position: Sitting, Cuff Size: Normal)   Pulse (!) 107   Ht 5\' 3"  (1.6 m)   Wt 158 lb 6.4 oz (71.8 kg)   LMP 04/28/2000   SpO2 98%   BMI 28.06 kg/m   GEN: Pleasant, interactive, well-nourished/chronically-ill appearing/acutely-ill appearing/poorly-nourished/morbidly obese; in no acute distress.****** HEENT:  Normocephalic and atraumatic. EACs patent bilaterally. TM pearly gray with present light reflex bilaterally. PERRLA. Sclera white. Nasal turbinates pink, moist and patent bilaterally. No rhinorrhea present. Oropharynx pink and moist, without exudate or edema. No lesions, ulcerations,  or postnasal drip.  NECK:  Supple w/ fair ROM. No JVD present. Normal carotid impulses w/o bruits. Thyroid symmetrical with no goiter or nodules palpated. No lymphadenopathy.   CV: RRR, no m/r/g, no peripheral edema. Pulses intact, +2 bilaterally. No cyanosis, pallor or clubbing. PULMONARY:  Unlabored, regular breathing. Clear bilaterally A&P w/o wheezes/rales/rhonchi. No accessory muscle use.  GI: BS present and normoactive. Soft, non-tender to palpation. No organomegaly or masses detected. No CVA tenderness. MSK: No erythema, warmth or tenderness. Cap refil <2 sec all extrem. No deformities or joint swelling noted.  Neuro: A/Ox3. No focal deficits noted.   Skin: Warm, no lesions or rashe Psych: Normal affect and behavior. Judgement and thought content appropriate.     Lab Results:  CBC    Component Value Date/Time   WBC 13.9 (H) 03/11/2023 0310   RBC 3.44 (L) 03/11/2023 0310   HGB 10.2 (L) 03/11/2023 0310   HGB 11.5 (L) 02/11/2023 0856   HCT 32.9 (L) 03/11/2023 0310  PLT 299 03/11/2023 0310   PLT 262 02/11/2023 0856   MCV 95.6 03/11/2023 0310   MCH 29.7 03/11/2023 0310   MCHC 31.0 03/11/2023 0310   RDW 14.8 03/11/2023 0310   LYMPHSABS 0.2 (L) 02/11/2023 0856   MONOABS 0.5 02/11/2023 0856   EOSABS 0.0 02/11/2023 0856   BASOSABS 0.0 02/11/2023 0856    BMET    Component Value Date/Time   NA 139 04/08/2023 1154   K 3.6 04/08/2023 1154   CL 98 04/08/2023 1154   CO2 32 04/08/2023 1154   GLUCOSE 92 04/08/2023 1154   BUN 11 04/08/2023 1154   CREATININE 1.09 04/08/2023 1154   CREATININE 1.16 (H) 02/11/2023 0856   CREATININE 0.80 07/24/2017 1438   CALCIUM 9.7 04/08/2023 1154   GFRNONAA 48 (L) 03/10/2023 0345   GFRNONAA 51 (L) 02/11/2023 0856   GFRNONAA 78 07/24/2017 1438   GFRAA >60 01/17/2020 0805   GFRAA 91 07/24/2017 1438    BNP No results found for: "BNP"   Imaging:  DG Chest 2 View Result Date: 04/06/2023 CLINICAL DATA:  Malignant pleural effusion. EXAM:  CHEST - 2 VIEW COMPARISON:  03/08/2023 and CT chest 03/09/2023. FINDINGS: Trachea is deviated slightly to the right, unchanged. Heart is enlarged. Increased linear consolidation in the left perihilar region, with an associated fiducial marker, possibly related to radiation therapy. Post radiation scarring in the perihilar right hemithorax. Moderate to large partially loculated right pleural effusion with PleurX catheter in place. IMPRESSION: 1. Increasing linear consolidation in the left perihilar region, likely due to evolving changes of radiation therapy. 2. Moderate to large partially loculated right pleural effusion with PleurX catheter in place, stable. 3. Post radiation scarring in the perihilar right hemithorax. Electronically Signed   By: Leanna Battles M.D.   On: 04/06/2023 16:24    Administration History     None          Latest Ref Rng & Units 03/08/2018   11:01 AM  PFT Results  FVC-Pre L 2.00   FVC-Predicted Pre % 104   FVC-Post L 1.91   FVC-Predicted Post % 99   Pre FEV1/FVC % % 73   Post FEV1/FCV % % 74   FEV1-Pre L 1.47   FEV1-Predicted Pre % 99   FEV1-Post L 1.42   TLC L 2.74   TLC % Predicted % 65   RV % Predicted % 61     No results found for: "NITRICOXIDE"      Assessment & Plan:   No problem-specific Assessment & Plan notes found for this encounter.   Advised if symptoms do not improve or worsen, to please contact office for sooner follow up or seek emergency care.   I spent *** minutes of dedicated to the care of this patient on the date of this encounter to include pre-visit review of records, face-to-face time with the patient discussing conditions above, post visit ordering of testing, clinical documentation with the electronic health record, making appropriate referrals as documented, and communicating necessary findings to members of the patients care team.  Noemi Chapel, NP 04/14/2023  Pt aware and understands NP's role.

## 2023-04-14 NOTE — Patient Instructions (Addendum)
Continue Albuterol inhaler 2 puffs every 6 hours as needed for shortness of breath or wheezing Continue Stiolto 2 puffs daily  Continue supplemental oxygen 2-3 lpm for goal >88-90%. Since insurance will not cover this, I have provided you with a prescription to purchase one out of pocket   -Extend Keflex for additional 4 days for total of 14 days -Prednisone 20 mg daily for 5 days. Take in AM with food -Guaifenesin 600 mg Twice daily for congestion until symptoms improve -Promethazine DM cough syrup 5 mL every 6 hours as needed for cough. May cause drowsiness. Do not drive after taking -Benzonatate 1 capsule Three times a day for cough -Saline nasal rinses 1-2 times a day. Use bottled, distilled water. Follow with flonase nasal spray 2 sprays each nostril daily 30 minutes after rinse  I will set you up with one of the doctors in the hospital to have your tube removed  Chest x ray today    Follow up in 6 weeks with Dr. Delton Coombes or Katie Willadene Mounsey,NP. If symptoms do not improve or worsen, please contact office for sooner follow up or seek emergency care.

## 2023-04-15 ENCOUNTER — Telehealth: Payer: Self-pay | Admitting: Nurse Practitioner

## 2023-04-15 ENCOUNTER — Encounter: Payer: Self-pay | Admitting: Nurse Practitioner

## 2023-04-15 NOTE — Progress Notes (Signed)
@Patient  ID: Susan Holder, female    DOB: [birth year], 67 y.o.   MRN: [id number]  Chief Complaint  Patient presents with   Follow-up    Pleurex Catheter. Is not working and had an infection last week.    Referring provider: [name], MD  HPI: 67 year old female, former smoker followed for chronic respiratory failure, emphysema, malignant pleural effusion. She has stage IV adenocarcinoma of right lung with brain mets and followed by Dr. Arbutus Ped. She is a former patient of Dr. Thora Lance and last seen in office 04/06/2023 by Dr. Vassie Loll for acute visit. Past medical history significant for atherosclerosis, GERD, RA, DDD.   TEST/EVENTS:  02/2018 PFT: FVC 104, FEV1 99, ratio 74, TLC 65 11/06/2022 PET: radiation changes in right hemithorax without focal hypermetabolism. Moderate right pleural effusion with indwelling catheter; scattered hypermetabolism along the course of the drain. Associated rounded atelectasis RLL. Irregular LLL nodule, 13x8 mm with mild hypermetabolism. Additional scatted patchy opacities in LUL, favor inflammation given appearance and mildly progressive with mild hypermetabolism in LUL and lateral aspect of lingula. Tumor cannot be excluded. Dominant opacity in LUL measures 1.1x2.1 cm. Atherosclerosis 11/06/2021 bronchoscopy: cytology of lingula with adenocarcinoma  02/2023 CTA chest: PE with multiple nonocclusive segmental and subsegmental sized filling defects to LUL. Large chronic right pleural effusion with pleural enhancement noted.   10/23/2022: OV with Dr. Thora Lance. Walked for POC - needs 3 lpm O2 pulsed with exertion. Enlarging spiculated posterior 1.2 cm LUL nodule on CT chest. Draining 300 cc every 2-3 days from PleurX. No change in symptoms. Scheduled for navigational bronch.   11/20/2022: OV with Oluwadarasimi Redmon NP for follow up. She had navigational bronchoscopy with Dr. Thora Lance on 7/12. Lingula biopsy revealed malignant cells consistent with adenocarcinoma. She has an  appointment with Dr. Mitzi Hansen today to discuss SBRT. She will have a repeat CT chest in October for restaging and follow up with Dr. Arbutus Ped afterwards. She tells me today that she is feeling relatively the same. Recovered easily after her bronch. She did have a small amount of hemoptysis for a few days following but this has resolved. Breathing and cough at her baseline. She tends to get short of breath with minimal exertion. She wants to know if there's anything else that can be done for this. She also wants to purchase a POC out of pocket. She was told by her DME that insurance wouldn't cover one given how long she has been on oxygen therapy. She denies any fevers, chills, recurrent hemoptysis, night sweats, anorexia, weight loss. She has never been on any inhalers. She does not have a rescue inhaler. She is still draining her PleurX. Gets about 200 cc every 2-3 days. Site is clean. She understands proper care of this.   01/22/2023: OV with Dr. Delton Coombes. Stage IV adenocarcinoma with brain metastases and malignant pleural effusion. Undergone concurrent chemoradiation, SRS to brain mets, then Alimta and Keytruda. Right PleurX in place, drains every 2-3 days. Gets about 300 cc. Tried on [employer]. Not currently using. Didn't notice a huge change. Wearing O2. Feeling well. Has some pain with drainage >300 cc.  04/06/2023: OV with Dr. Vassie Loll. Got out of rehab 11/27. Called office >> Nothing draining from PleurX. Called again 12/9 >> PleurX tube with blood in it; on blood thinner. Evaluated in office today. Minimal to no bloody drainage at home. Previously draining 300 cc every 3 days. Also complains of some itching around skin at exit and foul smell. CXR shows persistent effusion  with PleurX in place. Treated with Keflex for 7-10 days. Advised to attempt drainage 1-2 more times; may have to be removed.   04/14/2023: Today - acute Discussed the use of AI scribe software for clinical note transcription with the patient, who  gave verbal consent to proceed.  History of Present Illness   The patient, with a history of malignant pleural effusion, presents with concerns about a non-draining pleural drain and worsening respiratory symptoms. The drain, which was placed almost a year ago, stopped draining approximately three weeks prior to the consultation. The patient attempted to drain it at home and in the office today without success. The patient also reports an increase in cough and chest congestion over the past week, describing it as a feeling of tightness and increased mucus production. She has been coughing up phlegm, which is a new symptom for her. Usually clear. The patient denies any fever or chills, hemoptysis, chest pain, worsening shortness of breath.   The patient was hospitalized in November 2024 for bilateral PEs and started on Eliquis upon discharge. Breathing has been stable since. No issues with excessive bruising or bleeding. No missed doses.  The patient also reports that the site infection seems to be better; no longer with foul drainage.   The patient has not had to increase her oxygen use since the tube stopped draining and denies any significant change in her breathing since the tube stopped draining. The patient was draining the tube every couple of days, stopping at 300 milliliters each time, and the fluid was described as the color of tea.  With the increased cough, she is also experiencing a lot of sinus symptoms and drainage. Feels very congested. Drainage is clear. She has been hesitant to take over-the-counter medications due to her complex medical history. The patient's symptoms started about a week prior to the consultation, and she denies any known exposure to sick contacts or any at-home COVID testing.       No Known Allergies  Immunization History  Administered Date(s) Administered   Covid-19 Iv Non-us Vaccine (Bibp, [employer]) 12/28/2022   Influenza, High Dose Seasonal PF 01/22/2019    Influenza,inj,Quad PF,6+ Mos 03/08/2018   Influenza-Unspecified 02/08/2020, 02/16/2022, 12/29/2022   PFIZER(Purple Top)SARS-COV-2 Vaccination 06/02/2019, 06/23/2019, 12/24/2019    Past Medical History:  Diagnosis Date   Acid reflux 09/21/2018   Adenocarcinoma of right lung, stage 4 (HCC) 11/17/2017   ADHD (attention deficit hyperactivity disorder)    patient denies this dx as of 03/10/22   Allergic rhinitis 08/22/2013   ANA positive 05/29/2016   Anemia    Aortic atherosclerosis (HCC) 10/23/2020   Brain metastases 03/29/2019   COPD (chronic obstructive pulmonary disease) (HCC)    Coronary artery calcification seen on CT scan 10/23/2020   patient is unaware of this dx as of 03/10/22   DDD (degenerative disc disease), cervical 05/29/2016   patient denies this dx as of 03/10/22   Dyspnea    oxygen 2L via [place]   Encounter for antineoplastic chemotherapy 11/17/2017   Encounter for antineoplastic immunotherapy 02/01/2018   Goals of care, counseling/discussion 11/17/2017   High risk medication use 05/29/2016   04/29/2016: ==> plq 200 am & 100 qhs(adeq response).   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   Other fatigue 05/29/2016   Oxygen dependent    2L via [place]   Pericardial effusion 10/23/2020   Pneumonia    as a child   Primary malignant neoplasm of bronchus of right lower lobe (HCC) 12/21/2017   Rheumatoid arthritis (HCC)    Rheumatoid arthritis involving multiple sites with positive rheumatoid factor (HCC) 05/29/2016   +RF +ANA +CCP    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   Smoker 05/29/2016   quit 2019   Trigger finger, left ring finger 05/29/2016   Trigger finger, right ring finger 05/29/2016   Vitamin D deficiency 05/29/2016    Tobacco  History: Social History   Tobacco Use  Smoking Status Former   Current packs/day: 0.00   Average packs/day: 0.5 packs/day for 45.0 years (22.5 ttl pk-yrs)   Types: Cigarettes   Start date: 09/29/1972   Quit date: 09/29/2017   Years since quitting: 5.5   Passive exposure: Never  Smokeless Tobacco Never   Counseling given: Not Answered   Outpatient Medications Prior to Visit  Medication Sig Dispense Refill   acetaminophen (TYLENOL) 500 MG tablet Take 1,000 mg by mouth every 6 (six) hours as needed for moderate pain.     albuterol (PROVENTIL) (2.5 MG/3ML) 0.083% nebulizer solution Take 3 mLs (2.5 mg total) by nebulization every 4 (four) hours as needed for shortness of breath.     apixaban (ELIQUIS) 5 MG TABS tablet Take 1 tablet (5 mg total) by mouth 2 (two) times daily. 180 tablet 3   ascorbic acid (VITAMIN C) 500 MG tablet Take 500 mg by mouth daily.     bacitracin 500 UNIT/GM ointment Apply 1 Application topically 2 (two) times daily. 15 g 0   cephALEXin (KEFLEX) 500 MG capsule Take 1 capsule (500 mg total) by mouth 3 (three) times daily for 10 days. 30 capsule 0   Cholecalciferol (VITAMIN D-3) 125 MCG (5000 UT) TABS Take 5,000 Units by mouth daily.     furosemide (LASIX) 20 MG tablet Take 1 tablet (20 mg total) by mouth daily. 90 tablet 3   hydroxychloroquine (PLAQUENIL) 200 MG tablet Take 1 tablet (200 mg total) by mouth daily. 90 tablet 0   omeprazole (PRILOSEC) 20 MG capsule Take 20 mg by mouth daily.     potassium chloride (KLOR-CON) 10 MEQ tablet Take 1 tablet (10 mEq total) by mouth daily. 90 tablet 3   Tiotropium Bromide-Olodaterol (STIOLTO RESPIMAT) 2.5-2.5 MCG/ACT AERS Inhale 2 puffs into the lungs daily. 4 g 5   No facility-administered medications prior to visit.     Review of Systems:   Constitutional: No weight loss or gain, night sweats, fevers, chills, or lassitude. +fatigue (baseline) HEENT: No headaches, difficulty swallowing, tooth/dental problems, or sore  throat. No sneezing, itching, ear ache +nasal congestion/drainage, post nasal drip CV:  No chest pain, orthopnea, PND, swelling in lower extremities, anasarca, dizziness, palpitations, syncope Resp: +shortness of breath with exertion; increased cough. No hemoptysis. No wheezing.  No chest wall deformity GI:  No heartburn, indigestion GU: No dysuria, change in color of urine, urgency or frequency.   Skin: No rash. +right pleurX site infection  MSK:  No joint pain or swelling.   Neuro: No memory impairment  Psych: No depression or anxiety. Mood stable.     Physical Exam:  BP 110/70 (BP Location: Right Arm, Patient Position: Sitting, Cuff Size: Normal)   Pulse (!) 107   Ht 5\' 3"  (1.6 m)   Wt 158 lb  6.4 oz (71.8 kg)   LMP 04/28/2000   SpO2 98%   BMI 28.06 kg/m   GEN: Pleasant, interactive, well-kempt; in no acute distress. HEENT:  Normocephalic and atraumatic. PERRLA. Sclera white. Nasal turbinates erythematous, moist and patent bilaterally. Clear rhinorrhea present. Oropharynx pink and moist, without exudate or edema. No lesions, ulcerations, or postnasal drip.  NECK:  Supple w/ fair ROM. No JVD present. Normal carotid impulses w/o bruits. Thyroid symmetrical with no goiter or nodules palpated. No lymphadenopathy.   CV: mild tachycardia, regular rhythm, no m/r/g, no peripheral edema. Pulses intact, +2 bilaterally. No cyanosis, pallor or clubbing. PULMONARY:  Unlabored, regular breathing. Diminished bibasilar airflow otherwise clear bilaterally A&P w/o wheezes/rales/rhonchi. No accessory muscle use.  GI: BS present and normoactive. Soft, non-tender to palpation. No organomegaly or masses detected. MSK: No erythema, warmth or tenderness. Cap refil <2 sec all extrem. No deformities or joint swelling noted.  Neuro: A/Ox3. No focal deficits noted.   Skin: Warm, no rashes. Right indwelling catheter in place. Protruding adipose tissue from site with maceration. Scant amount of sanguinous  exudate. No foul odor, warmth or tenderness.  Psych: Normal affect and behavior. Judgement and thought content appropriate.     Lab Results:  CBC    Component Value Date/Time   WBC 13.9 (H) 03/11/2023 0310   RBC 3.44 (L) 03/11/2023 0310   HGB 10.2 (L) 03/11/2023 0310   HGB 11.5 (L) 02/11/2023 0856   HCT 32.9 (L) 03/11/2023 0310   PLT 299 03/11/2023 0310   PLT 262 02/11/2023 0856   MCV 95.6 03/11/2023 0310   MCH 29.7 03/11/2023 0310   MCHC 31.0 03/11/2023 0310   RDW 14.8 03/11/2023 0310   LYMPHSABS 0.2 (L) 02/11/2023 0856   MONOABS 0.5 02/11/2023 0856   EOSABS 0.0 02/11/2023 0856   BASOSABS 0.0 02/11/2023 0856    BMET    Component Value Date/Time   NA 139 04/08/2023 1154   K 3.6 04/08/2023 1154   CL 98 04/08/2023 1154   CO2 32 04/08/2023 1154   GLUCOSE 92 04/08/2023 1154   BUN 11 04/08/2023 1154   CREATININE 1.09 04/08/2023 1154   CREATININE 1.16 (H) 02/11/2023 0856   CREATININE 0.80 07/24/2017 1438   CALCIUM 9.7 04/08/2023 1154   GFRNONAA 48 (L) 03/10/2023 0345   GFRNONAA 51 (L) 02/11/2023 0856   GFRNONAA 78 07/24/2017 1438   GFRAA >60 01/17/2020 0805   GFRAA 91 07/24/2017 1438    BNP No results found for: "BNP"   Imaging:  DG Chest 2 View Result Date: 04/14/2023 CLINICAL DATA:  Cough.  Pleural effusion.  PleurX not draining EXAM: CHEST - 2 VIEW COMPARISON:  Chest x-ray 04/06/2023 and older. CT scan 2024 November FINDINGS: Moderate right effusion with PleurX catheter in place. Catheter has a similar orientation on today's x-ray extending posteriorly. Adjacent right lung base opacity. Persistent bandlike nodular opacity left midlung with a fiduciary marker. Blunting of the left costophrenic angle is stable, tiny effusion versus thickening. No pneumothorax seen. Normal cardiopericardial silhouette. Degenerative changes of the spine. IMPRESSION: No significant interval change. Persistent right-sided pleural effusion with a PleurX catheter in a similar orientation  to prior x-ray. Please correlate with clinical findings. Electronically Signed   By: Karen Kays M.D.   On: 04/14/2023 14:39   DG Chest 2 View Result Date: 04/06/2023 CLINICAL DATA:  Malignant pleural effusion. EXAM: CHEST - 2 VIEW COMPARISON:  03/08/2023 and CT chest 03/09/2023. FINDINGS: Trachea is deviated slightly to the right, unchanged. Heart is enlarged.  Increased linear consolidation in the left perihilar region, with an associated fiducial marker, possibly related to radiation therapy. Post radiation scarring in the perihilar right hemithorax. Moderate to large partially loculated right pleural effusion with PleurX catheter in place. IMPRESSION: 1. Increasing linear consolidation in the left perihilar region, likely due to evolving changes of radiation therapy. 2. Moderate to large partially loculated right pleural effusion with PleurX catheter in place, stable. 3. Post radiation scarring in the perihilar right hemithorax. Electronically Signed   By: Leanna Battles M.D.   On: 04/06/2023 16:24    Administration History     None          Latest Ref Rng & Units 03/08/2018   11:01 AM  PFT Results  FVC-Pre L 2.00   FVC-Predicted Pre % 104   FVC-Post L 1.91   FVC-Predicted Post % 99   Pre FEV1/FVC % % 73   Post FEV1/FCV % % 74   FEV1-Pre L 1.47   FEV1-Predicted Pre % 99   FEV1-Post L 1.42   TLC L 2.74   TLC % Predicted % 65   RV % Predicted % 61     No results found for: "NITRICOXIDE"      Assessment & Plan:   No problem-specific Assessment & Plan notes found for this encounter. Assessment and Plan    Pleural Effusion Malignant pleural effusion with a non-functioning PleurX drain. Attempted to drain in office with minimal output. There is adipose tissue protrusion with some maceration at site. Foul odor resolved. Will extend Keflex for total of 14 days. No increased oxygen requirements. PleurX will need to be removed. Will coordinate with procedure team to have him  removed in the endoscopy unit at [facility]. Monitoring for enlarging effusion required if not replaced. Her left effusion has remained stable since PleurX was removed by Dr. Cliffton Asters in 2023.  - Order chest x-ray to assess effusions given increased cough and rule out superimposed infection  - Schedule for tube removal with Dr. Chestine Spore 04/16/2023  COPD with acute exacerbation Likely related to viral illness. Out of the window for viral testing given timeframe. Chronic cough with increased severity over the past week, accompanied by head congestion, mucus, and occasional phlegm production. Discussed supportive care and a short course of steroids. Cough control measures. Informed about potential side effects and the importance of not driving after taking promethazine DM cough syrup. Continue current bronchodilators. Action plan in place.  - Prescribe prednisone 20 mg for 5 days - Prescribe benzonatate every 8 hours - Prescribe promethazine DM cough syrup - Recommend guaifenesin - Recommend Flonase nasal spray - Recommend saline rinse - Continue Stiolto 2 puffs daily - Continue albuterol PRN  Chronic respiratory failure on supplemental oxygen Stable without increased oxygen required. Goal >88-90% - Continue supplemental oxygen 2-3 lpm   Stage IV adenocarcinoma right lung with brain metastases  Stable disease on recent imaging. Status post concurrent chemoradiation and immunotherapy. On observation since May 2023. Radiotherapy with IMRT to LUL enlarging nodule completed 01/05/2023. Again placed on observation.  - Follow up with oncology as scheduled  - Monitor malignant effusions   URI See above. Supportive care  Follow-up - Coordinate with endoscopy for tube removal - Follow up as scheduled in January 2024      I spent 45 minutes of dedicated to the care of this patient on the date of this encounter to include pre-visit review of records, face-to-face time with the patient discussing  conditions above, post visit  ordering of testing, clinical documentation with the electronic health record, making appropriate referrals as documented, and communicating necessary findings to members of the patients care team.  Noemi Chapel, NP 04/15/2023  Pt aware and understands NP's role.

## 2023-04-15 NOTE — Telephone Encounter (Signed)
Patient aware of appt

## 2023-04-15 NOTE — H&P (View-Only) (Signed)
@Patient  ID: Susan Holder, female    DOB: [birth year], 67 y.o.   MRN: [id number]  Chief Complaint  Patient presents with   Follow-up    Pleurex Catheter. Is not working and had an infection last week.    Referring provider: [name], MD  HPI: 67 year old female, former smoker followed for chronic respiratory failure, emphysema, malignant pleural effusion. She has stage IV adenocarcinoma of right lung with brain mets and followed by Dr. Arbutus Ped. She is a former patient of Dr. Thora Lance and last seen in office 04/06/2023 by Dr. Vassie Loll for acute visit. Past medical history significant for atherosclerosis, GERD, RA, DDD.   TEST/EVENTS:  02/2018 PFT: FVC 104, FEV1 99, ratio 74, TLC 65 11/06/2022 PET: radiation changes in right hemithorax without focal hypermetabolism. Moderate right pleural effusion with indwelling catheter; scattered hypermetabolism along the course of the drain. Associated rounded atelectasis RLL. Irregular LLL nodule, 13x8 mm with mild hypermetabolism. Additional scatted patchy opacities in LUL, favor inflammation given appearance and mildly progressive with mild hypermetabolism in LUL and lateral aspect of lingula. Tumor cannot be excluded. Dominant opacity in LUL measures 1.1x2.1 cm. Atherosclerosis 11/06/2021 bronchoscopy: cytology of lingula with adenocarcinoma  02/2023 CTA chest: PE with multiple nonocclusive segmental and subsegmental sized filling defects to LUL. Large chronic right pleural effusion with pleural enhancement noted.   10/23/2022: OV with Dr. Thora Lance. Walked for POC - needs 3 lpm O2 pulsed with exertion. Enlarging spiculated posterior 1.2 cm LUL nodule on CT chest. Draining 300 cc every 2-3 days from PleurX. No change in symptoms. Scheduled for navigational bronch.   11/20/2022: OV with Oluwadarasimi Redmon NP for follow up. She had navigational bronchoscopy with Dr. Thora Lance on 7/12. Lingula biopsy revealed malignant cells consistent with adenocarcinoma. She has an  appointment with Dr. Mitzi Hansen today to discuss SBRT. She will have a repeat CT chest in October for restaging and follow up with Dr. Arbutus Ped afterwards. She tells me today that she is feeling relatively the same. Recovered easily after her bronch. She did have a small amount of hemoptysis for a few days following but this has resolved. Breathing and cough at her baseline. She tends to get short of breath with minimal exertion. She wants to know if there's anything else that can be done for this. She also wants to purchase a POC out of pocket. She was told by her DME that insurance wouldn't cover one given how long she has been on oxygen therapy. She denies any fevers, chills, recurrent hemoptysis, night sweats, anorexia, weight loss. She has never been on any inhalers. She does not have a rescue inhaler. She is still draining her PleurX. Gets about 200 cc every 2-3 days. Site is clean. She understands proper care of this.   01/22/2023: OV with Dr. Delton Coombes. Stage IV adenocarcinoma with brain metastases and malignant pleural effusion. Undergone concurrent chemoradiation, SRS to brain mets, then Alimta and Keytruda. Right PleurX in place, drains every 2-3 days. Gets about 300 cc. Tried on [employer]. Not currently using. Didn't notice a huge change. Wearing O2. Feeling well. Has some pain with drainage >300 cc.  04/06/2023: OV with Dr. Vassie Loll. Got out of rehab 11/27. Called office >> Nothing draining from PleurX. Called again 12/9 >> PleurX tube with blood in it; on blood thinner. Evaluated in office today. Minimal to no bloody drainage at home. Previously draining 300 cc every 3 days. Also complains of some itching around skin at exit and foul smell. CXR shows persistent effusion  with PleurX in place. Treated with Keflex for 7-10 days. Advised to attempt drainage 1-2 more times; may have to be removed.   04/14/2023: Today - acute Discussed the use of AI scribe software for clinical note transcription with the patient, who  gave verbal consent to proceed.  History of Present Illness   The patient, with a history of malignant pleural effusion, presents with concerns about a non-draining pleural drain and worsening respiratory symptoms. The drain, which was placed almost a year ago, stopped draining approximately three weeks prior to the consultation. The patient attempted to drain it at home and in the office today without success. The patient also reports an increase in cough and chest congestion over the past week, describing it as a feeling of tightness and increased mucus production. She has been coughing up phlegm, which is a new symptom for her. Usually clear. The patient denies any fever or chills, hemoptysis, chest pain, worsening shortness of breath.   The patient was hospitalized in November 2024 for bilateral PEs and started on Eliquis upon discharge. Breathing has been stable since. No issues with excessive bruising or bleeding. No missed doses.  The patient also reports that the site infection seems to be better; no longer with foul drainage.   The patient has not had to increase her oxygen use since the tube stopped draining and denies any significant change in her breathing since the tube stopped draining. The patient was draining the tube every couple of days, stopping at 300 milliliters each time, and the fluid was described as the color of tea.  With the increased cough, she is also experiencing a lot of sinus symptoms and drainage. Feels very congested. Drainage is clear. She has been hesitant to take over-the-counter medications due to her complex medical history. The patient's symptoms started about a week prior to the consultation, and she denies any known exposure to sick contacts or any at-home COVID testing.       No Known Allergies  Immunization History  Administered Date(s) Administered   Covid-19 Iv Non-us Vaccine (Bibp, [employer]oph[employer]) 12/28/2022   Influenza, High Dose Seasonal PF 01/22/2019    Influenza,inj,Quad PF,6+ Mos 03/08/2018   Influenza-Unspecified 02/08/2020, 02/16/2022, 12/29/2022   PFIZER(Purple Top)SARS-COV-2 Vaccination 06/02/2019, 06/23/2019, 12/24/2019    Past Medical History:  Diagnosis Date   Acid reflux 09/21/2018   Adenocarcinoma of right lung, stage 4 (HCC) 11/17/2017   ADHD (attention deficit hyperactivity disorder)    patient denies this dx as of 03/10/22   Allergic rhinitis 08/22/2013   ANA positive 05/29/2016   Anemia    Aortic atherosclerosis (HCC) 10/23/2020   Brain metastases 03/29/2019   COPD (chronic obstructive pulmonary disease) (HCC)    Coronary artery calcification seen on CT scan 10/23/2020   patient is unaware of this dx as of 03/10/22   DDD (degenerative disc disease), cervical 05/29/2016   patient denies this dx as of 03/10/22   Dyspnea    oxygen 2L via [place]   Encounter for antineoplastic chemotherapy 11/17/2017   Encounter for antineoplastic immunotherapy 02/01/2018   Goals of care, counseling/discussion 11/17/2017   High risk medication use 05/29/2016   04/29/2016: ==> plq 200 am & 100 qhs(adeq response).   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   Other fatigue 05/29/2016   Oxygen dependent    2L via [place]   Pericardial effusion 10/23/2020   Pneumonia    as a child   Primary malignant neoplasm of bronchus of right lower lobe (HCC) 12/21/2017   Rheumatoid arthritis (HCC)    Rheumatoid arthritis involving multiple sites with positive rheumatoid factor (HCC) 05/29/2016   +RF +ANA +CCP    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   Smoker 05/29/2016   quit 2019   Trigger finger, left ring finger 05/29/2016   Trigger finger, right ring finger 05/29/2016   Vitamin D deficiency 05/29/2016    Tobacco  History: Social History   Tobacco Use  Smoking Status Former   Current packs/day: 0.00   Average packs/day: 0.5 packs/day for 45.0 years (22.5 ttl pk-yrs)   Types: Cigarettes   Start date: 09/29/1972   Quit date: 09/29/2017   Years since quitting: 5.5   Passive exposure: Never  Smokeless Tobacco Never   Counseling given: Not Answered   Outpatient Medications Prior to Visit  Medication Sig Dispense Refill   acetaminophen (TYLENOL) 500 MG tablet Take 1,000 mg by mouth every 6 (six) hours as needed for moderate pain.     albuterol (PROVENTIL) (2.5 MG/3ML) 0.083% nebulizer solution Take 3 mLs (2.5 mg total) by nebulization every 4 (four) hours as needed for shortness of breath.     apixaban (ELIQUIS) 5 MG TABS tablet Take 1 tablet (5 mg total) by mouth 2 (two) times daily. 180 tablet 3   ascorbic acid (VITAMIN C) 500 MG tablet Take 500 mg by mouth daily.     bacitracin 500 UNIT/GM ointment Apply 1 Application topically 2 (two) times daily. 15 g 0   cephALEXin (KEFLEX) 500 MG capsule Take 1 capsule (500 mg total) by mouth 3 (three) times daily for 10 days. 30 capsule 0   Cholecalciferol (VITAMIN D-3) 125 MCG (5000 UT) TABS Take 5,000 Units by mouth daily.     furosemide (LASIX) 20 MG tablet Take 1 tablet (20 mg total) by mouth daily. 90 tablet 3   hydroxychloroquine (PLAQUENIL) 200 MG tablet Take 1 tablet (200 mg total) by mouth daily. 90 tablet 0   omeprazole (PRILOSEC) 20 MG capsule Take 20 mg by mouth daily.     potassium chloride (KLOR-CON) 10 MEQ tablet Take 1 tablet (10 mEq total) by mouth daily. 90 tablet 3   Tiotropium Bromide-Olodaterol (STIOLTO RESPIMAT) 2.5-2.5 MCG/ACT AERS Inhale 2 puffs into the lungs daily. 4 g 5   No facility-administered medications prior to visit.     Review of Systems:   Constitutional: No weight loss or gain, night sweats, fevers, chills, or lassitude. +fatigue (baseline) HEENT: No headaches, difficulty swallowing, tooth/dental problems, or sore  throat. No sneezing, itching, ear ache +nasal congestion/drainage, post nasal drip CV:  No chest pain, orthopnea, PND, swelling in lower extremities, anasarca, dizziness, palpitations, syncope Resp: +shortness of breath with exertion; increased cough. No hemoptysis. No wheezing.  No chest wall deformity GI:  No heartburn, indigestion GU: No dysuria, change in color of urine, urgency or frequency.   Skin: No rash. +right pleurX site infection  MSK:  No joint pain or swelling.   Neuro: No memory impairment  Psych: No depression or anxiety. Mood stable.     Physical Exam:  BP 110/70 (BP Location: Right Arm, Patient Position: Sitting, Cuff Size: Normal)   Pulse (!) 107   Ht 5\' 3"  (1.6 m)   Wt 158 lb  6.4 oz (71.8 kg)   LMP 04/28/2000   SpO2 98%   BMI 28.06 kg/m   GEN: Pleasant, interactive, well-kempt; in no acute distress. HEENT:  Normocephalic and atraumatic. PERRLA. Sclera white. Nasal turbinates erythematous, moist and patent bilaterally. Clear rhinorrhea present. Oropharynx pink and moist, without exudate or edema. No lesions, ulcerations, or postnasal drip.  NECK:  Supple w/ fair ROM. No JVD present. Normal carotid impulses w/o bruits. Thyroid symmetrical with no goiter or nodules palpated. No lymphadenopathy.   CV: mild tachycardia, regular rhythm, no m/r/g, no peripheral edema. Pulses intact, +2 bilaterally. No cyanosis, pallor or clubbing. PULMONARY:  Unlabored, regular breathing. Diminished bibasilar airflow otherwise clear bilaterally A&P w/o wheezes/rales/rhonchi. No accessory muscle use.  GI: BS present and normoactive. Soft, non-tender to palpation. No organomegaly or masses detected. MSK: No erythema, warmth or tenderness. Cap refil <2 sec all extrem. No deformities or joint swelling noted.  Neuro: A/Ox3. No focal deficits noted.   Skin: Warm, no rashes. Right indwelling catheter in place. Protruding adipose tissue from site with maceration. Scant amount of sanguinous  exudate. No foul odor, warmth or tenderness.  Psych: Normal affect and behavior. Judgement and thought content appropriate.     Lab Results:  CBC    Component Value Date/Time   WBC 13.9 (H) 03/11/2023 0310   RBC 3.44 (L) 03/11/2023 0310   HGB 10.2 (L) 03/11/2023 0310   HGB 11.5 (L) 02/11/2023 0856   HCT 32.9 (L) 03/11/2023 0310   PLT 299 03/11/2023 0310   PLT 262 02/11/2023 0856   MCV 95.6 03/11/2023 0310   MCH 29.7 03/11/2023 0310   MCHC 31.0 03/11/2023 0310   RDW 14.8 03/11/2023 0310   LYMPHSABS 0.2 (L) 02/11/2023 0856   MONOABS 0.5 02/11/2023 0856   EOSABS 0.0 02/11/2023 0856   BASOSABS 0.0 02/11/2023 0856    BMET    Component Value Date/Time   NA 139 04/08/2023 1154   K 3.6 04/08/2023 1154   CL 98 04/08/2023 1154   CO2 32 04/08/2023 1154   GLUCOSE 92 04/08/2023 1154   BUN 11 04/08/2023 1154   CREATININE 1.09 04/08/2023 1154   CREATININE 1.16 (H) 02/11/2023 0856   CREATININE 0.80 07/24/2017 1438   CALCIUM 9.7 04/08/2023 1154   GFRNONAA 48 (L) 03/10/2023 0345   GFRNONAA 51 (L) 02/11/2023 0856   GFRNONAA 78 07/24/2017 1438   GFRAA >60 01/17/2020 0805   GFRAA 91 07/24/2017 1438    BNP No results found for: "BNP"   Imaging:  DG Chest 2 View Result Date: 04/14/2023 CLINICAL DATA:  Cough.  Pleural effusion.  PleurX not draining EXAM: CHEST - 2 VIEW COMPARISON:  Chest x-ray 04/06/2023 and older. CT scan 2024 November FINDINGS: Moderate right effusion with PleurX catheter in place. Catheter has a similar orientation on today's x-ray extending posteriorly. Adjacent right lung base opacity. Persistent bandlike nodular opacity left midlung with a fiduciary marker. Blunting of the left costophrenic angle is stable, tiny effusion versus thickening. No pneumothorax seen. Normal cardiopericardial silhouette. Degenerative changes of the spine. IMPRESSION: No significant interval change. Persistent right-sided pleural effusion with a PleurX catheter in a similar orientation  to prior x-ray. Please correlate with clinical findings. Electronically Signed   By: Karen Kays M.D.   On: 04/14/2023 14:39   DG Chest 2 View Result Date: 04/06/2023 CLINICAL DATA:  Malignant pleural effusion. EXAM: CHEST - 2 VIEW COMPARISON:  03/08/2023 and CT chest 03/09/2023. FINDINGS: Trachea is deviated slightly to the right, unchanged. Heart is enlarged.  Increased linear consolidation in the left perihilar region, with an associated fiducial marker, possibly related to radiation therapy. Post radiation scarring in the perihilar right hemithorax. Moderate to large partially loculated right pleural effusion with PleurX catheter in place. IMPRESSION: 1. Increasing linear consolidation in the left perihilar region, likely due to evolving changes of radiation therapy. 2. Moderate to large partially loculated right pleural effusion with PleurX catheter in place, stable. 3. Post radiation scarring in the perihilar right hemithorax. Electronically Signed   By: Leanna Battles M.D.   On: 04/06/2023 16:24    Administration History     None          Latest Ref Rng & Units 03/08/2018   11:01 AM  PFT Results  FVC-Pre L 2.00   FVC-Predicted Pre % 104   FVC-Post L 1.91   FVC-Predicted Post % 99   Pre FEV1/FVC % % 73   Post FEV1/FCV % % 74   FEV1-Pre L 1.47   FEV1-Predicted Pre % 99   FEV1-Post L 1.42   TLC L 2.74   TLC % Predicted % 65   RV % Predicted % 61     No results found for: "NITRICOXIDE"      Assessment & Plan:   No problem-specific Assessment & Plan notes found for this encounter. Assessment and Plan    Pleural Effusion Malignant pleural effusion with a non-functioning PleurX drain. Attempted to drain in office with minimal output. There is adipose tissue protrusion with some maceration at site. Foul odor resolved. Will extend Keflex for total of 14 days. No increased oxygen requirements. PleurX will need to be removed. Will coordinate with procedure team to have him  removed in the endoscopy unit at [facility]. Monitoring for enlarging effusion required if not replaced. Her left effusion has remained stable since PleurX was removed by Dr. Cliffton Asters in 2023.  - Order chest x-ray to assess effusions given increased cough and rule out superimposed infection  - Schedule for tube removal with Dr. Chestine Spore 04/16/2023  COPD with acute exacerbation Likely related to viral illness. Out of the window for viral testing given timeframe. Chronic cough with increased severity over the past week, accompanied by head congestion, mucus, and occasional phlegm production. Discussed supportive care and a short course of steroids. Cough control measures. Informed about potential side effects and the importance of not driving after taking promethazine DM cough syrup. Continue current bronchodilators. Action plan in place.  - Prescribe prednisone 20 mg for 5 days - Prescribe benzonatate every 8 hours - Prescribe promethazine DM cough syrup - Recommend guaifenesin - Recommend Flonase nasal spray - Recommend saline rinse - Continue Stiolto 2 puffs daily - Continue albuterol PRN  Chronic respiratory failure on supplemental oxygen Stable without increased oxygen required. Goal >88-90% - Continue supplemental oxygen 2-3 lpm   Stage IV adenocarcinoma right lung with brain metastases  Stable disease on recent imaging. Status post concurrent chemoradiation and immunotherapy. On observation since May 2023. Radiotherapy with IMRT to LUL enlarging nodule completed 01/05/2023. Again placed on observation.  - Follow up with oncology as scheduled  - Monitor malignant effusions   URI See above. Supportive care  Follow-up - Coordinate with endoscopy for tube removal - Follow up as scheduled in January 2024      I spent 45 minutes of dedicated to the care of this patient on the date of this encounter to include pre-visit review of records, face-to-face time with the patient discussing  conditions above, post visit  ordering of testing, clinical documentation with the electronic health record, making appropriate referrals as documented, and communicating necessary findings to members of the patients care team.  Noemi Chapel, NP 04/15/2023  Pt aware and understands NP's role.

## 2023-04-16 ENCOUNTER — Ambulatory Visit (HOSPITAL_COMMUNITY)
Admission: RE | Admit: 2023-04-16 | Discharge: 2023-04-16 | Disposition: A | Discharge status: Home / Self Care | Payer: Medicare PPO | Attending: Critical Care Medicine | Admitting: Critical Care Medicine

## 2023-04-16 ENCOUNTER — Encounter (HOSPITAL_COMMUNITY): Payer: Self-pay | Admitting: Critical Care Medicine

## 2023-04-16 ENCOUNTER — Encounter (HOSPITAL_COMMUNITY)
Admission: RE | Disposition: A | Discharge status: Home / Self Care | Payer: Self-pay | Source: Home / Self Care | Attending: Critical Care Medicine

## 2023-04-16 ENCOUNTER — Telehealth: Payer: Self-pay | Admitting: Critical Care Medicine

## 2023-04-16 DIAGNOSIS — Y828 Other medical devices associated with adverse incidents: Secondary | ICD-10-CM | POA: Diagnosis not present

## 2023-04-16 DIAGNOSIS — C3491 Malignant neoplasm of unspecified part of right bronchus or lung: Secondary | ICD-10-CM | POA: Diagnosis not present

## 2023-04-16 DIAGNOSIS — J910 Malignant pleural effusion: Secondary | ICD-10-CM | POA: Insufficient documentation

## 2023-04-16 DIAGNOSIS — M069 Rheumatoid arthritis, unspecified: Secondary | ICD-10-CM | POA: Diagnosis not present

## 2023-04-16 DIAGNOSIS — J441 Chronic obstructive pulmonary disease with (acute) exacerbation: Secondary | ICD-10-CM | POA: Diagnosis not present

## 2023-04-16 DIAGNOSIS — T85698A Other mechanical complication of other specified internal prosthetic devices, implants and grafts, initial encounter: Secondary | ICD-10-CM | POA: Insufficient documentation

## 2023-04-16 DIAGNOSIS — Z7901 Long term (current) use of anticoagulants: Secondary | ICD-10-CM | POA: Insufficient documentation

## 2023-04-16 DIAGNOSIS — Z86711 Personal history of pulmonary embolism: Secondary | ICD-10-CM | POA: Diagnosis not present

## 2023-04-16 DIAGNOSIS — J9610 Chronic respiratory failure, unspecified whether with hypoxia or hypercapnia: Secondary | ICD-10-CM | POA: Insufficient documentation

## 2023-04-16 DIAGNOSIS — I700 Atherosclerosis of aorta: Secondary | ICD-10-CM | POA: Insufficient documentation

## 2023-04-16 DIAGNOSIS — Z87891 Personal history of nicotine dependence: Secondary | ICD-10-CM | POA: Insufficient documentation

## 2023-04-16 DIAGNOSIS — J069 Acute upper respiratory infection, unspecified: Secondary | ICD-10-CM | POA: Diagnosis not present

## 2023-04-16 DIAGNOSIS — T8579XA Infection and inflammatory reaction due to other internal prosthetic devices, implants and grafts, initial encounter: Secondary | ICD-10-CM | POA: Diagnosis not present

## 2023-04-16 DIAGNOSIS — C7931 Secondary malignant neoplasm of brain: Secondary | ICD-10-CM | POA: Insufficient documentation

## 2023-04-16 DIAGNOSIS — J439 Emphysema, unspecified: Secondary | ICD-10-CM | POA: Insufficient documentation

## 2023-04-16 DIAGNOSIS — Z9981 Dependence on supplemental oxygen: Secondary | ICD-10-CM | POA: Insufficient documentation

## 2023-04-16 DIAGNOSIS — K219 Gastro-esophageal reflux disease without esophagitis: Secondary | ICD-10-CM | POA: Diagnosis not present

## 2023-04-16 HISTORY — PX: REMOVAL OF PLEURAL DRAINAGE CATHETER: SHX5080

## 2023-04-16 SURGERY — MINOR REMOVAL OF PLEURAL DRAINAGE CATHETER

## 2023-04-16 MED ORDER — LIDOCAINE HCL (PF) 1 % IJ SOLN
INTRAMUSCULAR | Status: AC
  Filled 2023-04-16: qty 30

## 2023-04-16 MED ORDER — LIDOCAINE HCL 1 % IJ SOLN
INTRAMUSCULAR | Status: DC | PRN
  Administered 2023-04-16: 3 mL via INTRAPLEURAL

## 2023-04-16 NOTE — Op Note (Signed)
Insertion site injected with 1% lidocaine. PleurX removed without need for dissection. Some pleural fluid drainage after removal; 1 suture placed with cessation of leaking pleural fluid.   Instructions for occlusive dressing over site; keep this dressing in place for the next 2 days, then daily dressing change until sutures removed.  Suture needs to be removed in 7-10 days in the office. Appointment requested.  Steffanie Dunn, DO 04/16/23 2:50 PM [facility]  For contact information, see Amion. If no response to pager, please call PCCM consult pager. After hours, 7PM- 7AM, please call Elink.

## 2023-04-16 NOTE — Interval H&P Note (Signed)
History and Physical Interval Note:  04/16/2023 2:24 PM  Susan Holder  has presented today for surgery, with the diagnosis of removal of pleurx.  The various methods of treatment have been discussed with the patient and family. After consideration of risks, benefits and other options for treatment, the patient has consented to  Procedure(s): MINOR REMOVAL OF PLEURAL DRAINAGE CATHETER (N/A) as a surgical intervention.  The patient's history has been reviewed, patient examined, no change in status, stable for surgery.  I have reviewed the patient's chart and labs.  Questions were answered to the patient's satisfaction.   Presented to have pleurX removed. Had her contralateral pleurX removed in [facility] office previously with lidocaine. She has not noticed worse SOB since her pleurX stopped draining. CXR effusion appears stable from 17th compared to 9 days ago. She consents to removal of her current pleurX.    Steffanie Dunn, DO 04/16/23 2:25 PM [facility]  For contact information, see Amion. If no response to pager, please call PCCM consult pager. After hours, 7PM- 7AM, please call Elink.

## 2023-04-16 NOTE — Telephone Encounter (Signed)
Visit requested for suture removal in 7-10 days.  Steffanie Dunn, DO 04/16/23 2:49 PM [facility]  For contact information, see Amion. If no response to pager, please call PCCM consult pager. After hours, 7PM- 7AM, please call Elink.

## 2023-04-16 NOTE — Progress Notes (Signed)
Patient in today for pleurx removal. Removed by Dr. Chestine Spore. Did leave sutures in place for closure. Per Dr. Chestine Spore sutures need to be removed in office for 7-10 days. Per Dr. Sammuel Bailiff, no xray needed before leaving hospital. Will d/c patient to home.

## 2023-04-16 NOTE — Discharge Summary (Signed)
Physician Discharge Summary  Patient ID: LAVANA AMEND MRN: [id number] DOB/AGE: [birth year] 67 y.o.  Admit date: 04/16/2023 Discharge date: 04/16/2023  Admission Diagnoses: nonfunctioning pleurX catheter  Discharge Diagnoses:  Stage 4 NSCLC  Discharged Condition: good  Hospital Course: Admitted to endoscopy for pleurX removal. Removed without use of sedation; only 1% lidocaine. PleurX removed uneventfully. Due to draining fluid, 1 suture placed.   Consults: None  Significant Diagnostic Studies: none  Treatments: pleurX removal  Discharge Exam: Blood pressure 137/66, pulse (!) 104, temperature 98 F (36.7 C), temperature source Temporal, resp. rate (!) 21, last menstrual period 04/28/2000, SpO2 95%. Elderly woman sitting up in bed in NAD [place]/AT, eyes anicteric Breathing comfortably on RA S1S2, RRR Skin warm, dry, no rashes Answering questions appropriately.  Disposition: Discharge disposition: 01-Home or Self Care       Discharge Instructions     Diet - low sodium heart healthy   Complete by: As directed    Increase activity slowly   Complete by: As directed       Allergies as of 04/16/2023   No Known Allergies      Medication List     TAKE these medications    acetaminophen 500 MG tablet Commonly known as: TYLENOL Take 1,000 mg by mouth every 6 (six) hours as needed for moderate pain.   albuterol (2.5 MG/3ML) 0.083% nebulizer solution Commonly known as: PROVENTIL Take 3 mLs (2.5 mg total) by nebulization every 4 (four) hours as needed for shortness of breath.   apixaban 5 MG Tabs tablet Commonly known as: ELIQUIS Take 1 tablet (5 mg total) by mouth 2 (two) times daily.   ascorbic acid 500 MG tablet Commonly known as: VITAMIN C Take 500 mg by mouth daily.   bacitracin 500 UNIT/GM ointment Apply 1 Application topically 2 (two) times daily.   benzonatate 200 MG capsule Commonly known as: TESSALON Take 1 capsule (200 mg total) by mouth 3  (three) times daily as needed for cough.   cephALEXin 500 MG capsule Commonly known as: Keflex Take 1 capsule (500 mg total) by mouth 3 (three) times daily for 10 days.   cephALEXin 500 MG capsule Commonly known as: Keflex Take 1 capsule (500 mg total) by mouth 3 (three) times daily. For a total of 14 days   fluticasone 50 MCG/ACT nasal spray Commonly known as: FLONASE Place 1 spray into both nostrils daily.   furosemide 20 MG tablet Commonly known as: LASIX Take 1 tablet (20 mg total) by mouth daily.   hydroxychloroquine 200 MG tablet Commonly known as: PLAQUENIL Take 1 tablet (200 mg total) by mouth daily.   omeprazole 20 MG capsule Commonly known as: PRILOSEC Take 20 mg by mouth daily.   potassium chloride 10 MEQ tablet Commonly known as: KLOR-CON Take 1 tablet (10 mEq total) by mouth daily.   predniSONE 20 MG tablet Commonly known as: DELTASONE Take 1 tablet (20 mg total) by mouth daily with breakfast for 5 days.   promethazine-dextromethorphan 6.25-15 MG/5ML syrup Commonly known as: PROMETHAZINE-DM Take 5 mLs by mouth 4 (four) times daily as needed for cough.   Stiolto Respimat 2.5-2.5 MCG/ACT Aers Generic drug: Tiotropium Bromide-Olodaterol Inhale 2 puffs into the lungs daily.   Vitamin D-3 125 MCG (5000 UT) Tabs Take 5,000 Units by mouth daily.         Signed: Steffanie Dunn 04/16/2023, 2:52 PM

## 2023-04-16 NOTE — Telephone Encounter (Signed)
I am out of the office 12/23-12/30 so Tammy will see her 12/26 for suture removal. Thanks!

## 2023-04-17 ENCOUNTER — Encounter (HOSPITAL_COMMUNITY): Payer: Self-pay | Admitting: Critical Care Medicine

## 2023-04-23 ENCOUNTER — Ambulatory Visit: Payer: Medicare PPO

## 2023-04-23 ENCOUNTER — Encounter: Payer: Self-pay | Admitting: Adult Health

## 2023-04-23 ENCOUNTER — Ambulatory Visit: Payer: Medicare PPO | Admitting: Adult Health

## 2023-04-23 VITALS — BP 106/56 | HR 104 | Temp 98.1°F | Ht 60.0 in | Wt 157.6 lb

## 2023-04-23 DIAGNOSIS — J90 Pleural effusion, not elsewhere classified: Secondary | ICD-10-CM

## 2023-04-23 DIAGNOSIS — J439 Emphysema, unspecified: Secondary | ICD-10-CM | POA: Diagnosis not present

## 2023-04-23 DIAGNOSIS — J441 Chronic obstructive pulmonary disease with (acute) exacerbation: Secondary | ICD-10-CM

## 2023-04-23 DIAGNOSIS — J910 Malignant pleural effusion: Secondary | ICD-10-CM

## 2023-04-23 DIAGNOSIS — Z4682 Encounter for fitting and adjustment of non-vascular catheter: Secondary | ICD-10-CM | POA: Diagnosis not present

## 2023-04-23 DIAGNOSIS — R918 Other nonspecific abnormal finding of lung field: Secondary | ICD-10-CM | POA: Diagnosis not present

## 2023-04-23 MED ORDER — AMOXICILLIN-POT CLAVULANATE 875-125 MG PO TABS: 1.0000 | ORAL_TABLET | Freq: Two times a day (BID) | ORAL | 0 refills | Status: DC

## 2023-04-23 MED ORDER — BREZTRI AEROSPHERE 160-9-4.8 MCG/ACT IN AERO: 2.0000 | INHALATION_SPRAY | Freq: Two times a day (BID) | RESPIRATORY_TRACT | Status: DC

## 2023-04-23 MED ORDER — ALBUTEROL SULFATE (2.5 MG/3ML) 0.083% IN NEBU
2.5000 mg | INHALATION_SOLUTION | Freq: Once | RESPIRATORY_TRACT | Status: AC
  Administered 2023-04-23: 2.5 mg via RESPIRATORY_TRACT

## 2023-04-23 MED ORDER — HYDROCODONE BIT-HOMATROP MBR 5-1.5 MG/5ML PO SOLN: 5.0000 mL | Freq: Three times a day (TID) | ORAL | 0 refills | Status: DC | PRN

## 2023-04-23 NOTE — Patient Instructions (Addendum)
Albuterol inhaler or neb  As needed   Order for home nebulizer  Chest xray today  Begin Delsym 2 tsp Twice daily  for cough  Tessalon Three times a day   Mucinex Twice daily  As needed  cough/congestion  Augmentin 875mg  Twice daily  for 1 week-take with food.  Begin Breztri 2 puffs Twice daily, rinse after use.-finish sample then go back to  Stiolto  2 puffs daily  Hydromet 1 tsp every 8 hr as needed for severe cough, may make you sleepy  Use with caution.  Continue on 2l/m at rest and 3l/m with activity  Follow up with Oncology as planned next month Continue on Eliquis as directed.  Follow up with Dr. Delton Coombes  in 3-4 weeks and As needed   Please contact office for sooner follow up if symptoms do not improve or worsen or seek emergency care

## 2023-04-23 NOTE — Progress Notes (Signed)
@Patient  ID: Susan Holder, female    DOB: 09/15/1955, 67 y.o.   MRN: [id number]  Chief Complaint  Patient presents with   Follow-up   Discussed the use of AI scribe software for clinical note transcription with the patient, who gave verbal consent to proceed.  Referring provider: [name], MD  HPI: This 67 year old female former smoker followed for stage IV adenocarcinoma with brain metastasis-status post concurrent chemoradiation, XRT to brain mets, immunotherapy, malignant right pleural effusion status post Pleurx tube placement March 11, 2022 through April 16, 2023. ,  Pulmonary embolism (diagnosed March 09, 2023), COPD with emphysema She has chronic respiratory failure on oxygen  TEST/EVENTS :  02/2018 PFT: FVC 104, FEV1 99, ratio 74, TLC 65   11/06/2022 PET: radiation changes in right hemithorax without focal hypermetabolism. Moderate right pleural effusion with indwelling catheter; scattered hypermetabolism along the course of the drain. Associated rounded atelectasis RLL. Irregular LLL nodule, 13x8 mm with mild hypermetabolism. Additional scatted patchy opacities in LUL, favor inflammation given appearance and mildly progressive with mild hypermetabolism in LUL and lateral aspect of lingula. Tumor cannot be excluded. Dominant opacity in LUL measures 1.1x2.1 cm. Atherosclerosis 11/06/2021 bronchoscopy: cytology of lingula with adenocarcinoma     CT chest March 09, 2023 positive for PE, right chest tube stable and upper right hemithorax, large chronic right pleural effusion, left upper lobe nodule with evolving postradiation changes, moderate emphysema  04/24/2023 Follow up : Malignant pleural effusion, COPD with emphysema, chronic respiratory failure, PE Patient presents for a 2-week follow-up.  Patient was recently seen in the office earlier this month after her Pleurx tube stopped draining and had a foul odor and drainage around the Pleurx site.  She was  placed on 10 days of Keflex.  Tube continue to not drain.  It was deemed Pleurx needed to be removed.  This was completed on April 16, 2023.  She is here today for suture removal.  Patient says Pleurx site skin irritation has resolved.  She presents with a persistent cough of approximately 2-3 weeks duration. The cough is described as different from their usual, associated with mucus that is difficult to expectorate, and has been unresponsive to multiple treatments including antibiotics, prednisone, Tessalon Perles, and over-the-counter Mucinex. The cough is present throughout the day and night, causing the patient to wake up every hour. The patient reports no shortness of breath at rest, but experiences it with activity.  Cough is causing her ribs to be painful  The patient also reports a recent history of sinus congestion with mucus production, which has improved. They deny any fever, heartburn, or indigestion.  She remains on Stiolto daily.   Recently diagnosed with PE remains on Eliquis and endorses compliance.  Denies any hemoptysis   The patient has a history of rheumatoid arthritis and is on Plaquenil.   The patient reports difficulty eating due to weakness and discomfort from persistent coughing. They are on oxygen therapy, with a setting of 2 liters at home and 3 L when moving around or traveling.  No Known Allergies  Immunization History  Administered Date(s) Administered   Covid-19 Iv Non-us Vaccine (Bibp, [employer]oph[employer]) 12/28/2022   Influenza, High Dose Seasonal PF 01/22/2019   Influenza,inj,Quad PF,6+ Mos 03/08/2018   Influenza-Unspecified 02/08/2020, 02/16/2022, 12/29/2022   PFIZER(Purple Top)SARS-COV-2 Vaccination 06/02/2019, 06/23/2019, 12/24/2019    Past Medical History:  Diagnosis Date   Acid reflux 09/21/2018   Adenocarcinoma of right lung, stage 4 (HCC) 11/17/2017   ADHD (attention deficit  hyperactivity disorder)    patient denies this dx as of 03/10/22   Allergic  rhinitis 08/22/2013   ANA positive 05/29/2016   Anemia    Aortic atherosclerosis (HCC) 10/23/2020   Brain metastases 03/29/2019   COPD (chronic obstructive pulmonary disease) (HCC)    Coronary artery calcification seen on CT scan 10/23/2020   patient is unaware of this dx as of 03/10/22   DDD (degenerative disc disease), cervical 05/29/2016   patient denies this dx as of 03/10/22   Dyspnea    oxygen 2L via [place]   Encounter for antineoplastic chemotherapy 11/17/2017   Encounter for antineoplastic immunotherapy 02/01/2018   Goals of care, counseling/discussion 11/17/2017   High risk medication use 05/29/2016   04/29/2016: ==> plq 200 am & 100 qhs(adeq response).   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   Other fatigue 05/29/2016   Oxygen dependent    2L via [place]   Pericardial effusion 10/23/2020   Pneumonia    as a child   Primary malignant neoplasm of bronchus of right lower lobe (HCC) 12/21/2017   Rheumatoid arthritis (HCC)    Rheumatoid arthritis involving multiple sites with positive rheumatoid factor (HCC) 05/29/2016   +RF +ANA +CCP    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   Smoker 05/29/2016   quit 2019   Trigger finger, left ring finger 05/29/2016   Trigger finger, right ring finger 05/29/2016   Vitamin D deficiency 05/29/2016    Tobacco History: Social History   Tobacco Use  Smoking Status Former   Current packs/day: 0.00   Average packs/day: 0.5 packs/day for 45.0 years (22.5 ttl pk-yrs)   Types: Cigarettes   Start date: 09/29/1972   Quit date: 09/29/2017   Years since quitting: 5.5   Passive exposure: Never  Smokeless Tobacco Never   Counseling given: Not Answered   Outpatient Medications Prior to Visit  Medication Sig Dispense Refill    acetaminophen (TYLENOL) 500 MG tablet Take 1,000 mg by mouth every 6 (six) hours as needed for moderate pain.     albuterol (PROVENTIL) (2.5 MG/3ML) 0.083% nebulizer solution Take 3 mLs (2.5 mg total) by nebulization every 4 (four) hours as needed for shortness of breath.     apixaban (ELIQUIS) 5 MG TABS tablet Take 1 tablet (5 mg total) by mouth 2 (two) times daily. 180 tablet 3   ascorbic acid (VITAMIN C) 500 MG tablet Take 500 mg by mouth daily.     benzonatate (TESSALON) 200 MG capsule Take 1 capsule (200 mg total) by mouth 3 (three) times daily as needed for cough. 30 capsule 1   Cholecalciferol (VITAMIN D-3) 125 MCG (5000 UT) TABS Take 5,000 Units by mouth daily.     fluticasone (FLONASE) 50 MCG/ACT nasal spray Place 1 spray into both nostrils daily. 18.2 mL 2   furosemide (LASIX) 20 MG tablet Take 1 tablet (20 mg total) by mouth daily. 90 tablet 3   hydroxychloroquine (PLAQUENIL) 200 MG tablet Take 1 tablet (200 mg total) by mouth daily. 90 tablet 0   omeprazole (PRILOSEC) 20 MG capsule Take 20 mg by mouth daily.     potassium chloride (KLOR-CON) 10 MEQ tablet Take 1 tablet (10 mEq total) by mouth daily. 90 tablet  3   promethazine-dextromethorphan (PROMETHAZINE-DM) 6.25-15 MG/5ML syrup Take 5 mLs by mouth 4 (four) times daily as needed for cough. 118 mL 0   Tiotropium Bromide-Olodaterol (STIOLTO RESPIMAT) 2.5-2.5 MCG/ACT AERS Inhale 2 puffs into the lungs daily. 4 g 5   bacitracin 500 UNIT/GM ointment Apply 1 Application topically 2 (two) times daily. (Patient not taking: Reported on 04/23/2023) 15 g 0   cephALEXin (KEFLEX) 500 MG capsule Take 1 capsule (500 mg total) by mouth 3 (three) times daily. For a total of 14 days (Patient not taking: Reported on 04/23/2023) 12 capsule 0   No facility-administered medications prior to visit.     Review of Systems:   Constitutional:   No  weight loss, night sweats,  Fevers, chills, +fatigue, or  lassitude.  HEENT:   No headaches,  Difficulty  swallowing,  Tooth/dental problems, or  Sore throat,                No sneezing, itching, ear ache, nasal congestion, post nasal drip,   CV:  No chest pain,  Orthopnea, PND, swelling in lower extremities, anasarca, dizziness, palpitations, syncope.   GI  No heartburn, indigestion, abdominal pain, nausea, vomiting, diarrhea, change in bowel habits, loss of appetite, bloody stools.   Resp: .  No chest wall deformity  Skin: no rash or lesions.  GU: no dysuria, change in color of urine, no urgency or frequency.  No flank pain, no hematuria   MS:  No joint pain or swelling.  No decreased range of motion.  No back pain.    Physical Exam  BP (!) 106/56 (BP Location: Right Arm, Patient Position: Sitting, Cuff Size: Large)   Pulse (!) 104   Temp 98.1 F (36.7 C) (Oral)   Ht 5' (1.524 m)   Wt 157 lb 9.6 oz (71.5 kg)   LMP 04/28/2000   BMI 30.78 kg/m   GEN: A/Ox3; pleasant , NAD, frail, elderly in wheelchair on oxygen   HEENT:  [place]/AT,  EACs-clear, TMs-wnl, NOSE-clear, THROAT-clear, no lesions, no postnasal drip or exudate noted.   NECK:  Supple w/ fair ROM; no JVD; normal carotid impulses w/o bruits; no thyromegaly or nodules palpated; no lymphadenopathy.    RESP diminished breaths in the bases.   no accessory muscle use, no dullness to percussion Along the right mid lateral rib suture in place skin without significant redness-suture removed without difficulty.  Band-Aid placed.  CARD:  RRR, no m/r/g, no peripheral edema, pulses intact, no cyanosis or clubbing.  GI:   Soft & nt; nml bowel sounds; no organomegaly or masses detected.   Musco: Warm bil, no deformities or joint swelling noted.   Neuro: alert, no focal deficits noted.    Skin: Warm, no lesions or rashes    Lab Results:  CBC   BMET   BNP No results found for: "BNP"  ProBNP No results found for: "PROBNP"  Imaging: DG Chest 2 View Result Date: 04/14/2023 CLINICAL DATA:  Cough.  Pleural effusion.   PleurX not draining EXAM: CHEST - 2 VIEW COMPARISON:  Chest x-ray 04/06/2023 and older. CT scan 2024 November FINDINGS: Moderate right effusion with PleurX catheter in place. Catheter has a similar orientation on today's x-ray extending posteriorly. Adjacent right lung base opacity. Persistent bandlike nodular opacity left midlung with a fiduciary marker. Blunting of the left costophrenic angle is stable, tiny effusion versus thickening. No pneumothorax seen. Normal cardiopericardial silhouette. Degenerative changes of the spine. IMPRESSION: No significant interval change. Persistent right-sided pleural effusion  with a PleurX catheter in a similar orientation to prior x-ray. Please correlate with clinical findings. Electronically Signed   By: Karen Kays M.D.   On: 04/14/2023 14:39   DG Chest 2 View Result Date: 04/06/2023 CLINICAL DATA:  Malignant pleural effusion. EXAM: CHEST - 2 VIEW COMPARISON:  03/08/2023 and CT chest 03/09/2023. FINDINGS: Trachea is deviated slightly to the right, unchanged. Heart is enlarged. Increased linear consolidation in the left perihilar region, with an associated fiducial marker, possibly related to radiation therapy. Post radiation scarring in the perihilar right hemithorax. Moderate to large partially loculated right pleural effusion with PleurX catheter in place. IMPRESSION: 1. Increasing linear consolidation in the left perihilar region, likely due to evolving changes of radiation therapy. 2. Moderate to large partially loculated right pleural effusion with PleurX catheter in place, stable. 3. Post radiation scarring in the perihilar right hemithorax. Electronically Signed   By: Leanna Battles M.D.   On: 04/06/2023 16:24    Administration History     None          Latest Ref Rng & Units 03/08/2018   11:01 AM  PFT Results  FVC-Pre L 2.00   FVC-Predicted Pre % 104   FVC-Post L 1.91   FVC-Predicted Post % 99   Pre FEV1/FVC % % 73   Post FEV1/FCV % % 74    FEV1-Pre L 1.47   FEV1-Predicted Pre % 99   FEV1-Post L 1.42   TLC L 2.74   TLC % Predicted % 65   RV % Predicted % 61     No results found for: "NITRICOXIDE"      Assessment & Plan:  Assessment and Plan  COPD with emphysema They present with a persistent cough lasting 2-3 weeks, exhibiting a change in character and accompanied by yellowish mucus, without fever. Previous treatments including prednisone, promethazine, and Tessalon Perles have been ineffective. Considering bronchitis or secondary pneumonia as differential diagnoses, we discussed various treatment options. We will order a chest x-ray and administer a albuterol nebulizer treatment in the office.  Will begin Augmentin x 7 days . hydrocodone cough syrup is prescribed for symptomatic relief, we discussed the potential for sedating effects and to use with caution we recommend Delsym, 2 tsp twice a day, and continuation of Tessalon Perles TID and Mucinex. A Breztri inhaler sample is prescribed for 5 days, after which they will return to [employer].  Plan  Patient Instructions  Albuterol inhaler or neb  As needed   Order for home nebulizer  Chest xray today  Begin Delsym 2 tsp Twice daily  for cough  Tessalon Three times a day   Mucinex Twice daily  As needed  cough/congestion  Augmentin 875mg  Twice daily  for 1 week-take with food.  Begin Breztri 2 puffs Twice daily, rinse after use.-finish sample then go back to  Stiolto  2 puffs daily  Hydromet 1 tsp every 8 hr as needed for severe cough, may make you sleepy  Use with caution.  Continue on 2l/m at rest and 3l/m with activity  Follow up with Oncology as planned next month Continue on Eliquis as directed.  Follow up with Dr. Delton Coombes  in 3-4 weeks and As needed   Please contact office for sooner follow up if symptoms do not improve or worsen or seek emergency care        Malignant right pleural Effusion-recent Pleurx removal.  Suture removed without difficulty.  Skin  care discussed.  No signs of cellulitis noted. Chest  x-ray today.  Pulmonary Embolism-recently diagnosed with PE.  Continue on Eliquis.  Anticoagulation education given.  Avoid nonsteroidals  Rheumatoid Arthritis Continue follow-up with rheumatology.  She is on maintenance regimen with Plaquenil.    Chronic respiratory failure continue on oxygen to maintain O2 saturations greater than 88 to 90%  Metastatic lung cancer continue follow-up with oncology   Follow-up They are scheduled to follow up with Dr. Arbutus Ped next month.  As planned.  Follow-up Dr. Delton Coombes in 3 to 4 weeks and As needed   Please contact office for sooner follow up if symptoms do not improve or worsen or seek emergency care          Rubye Oaks, NP 04/23/2023

## 2023-04-24 ENCOUNTER — Telehealth: Payer: Self-pay

## 2023-04-24 ENCOUNTER — Other Ambulatory Visit: Payer: Self-pay

## 2023-04-24 DIAGNOSIS — I2699 Other pulmonary embolism without acute cor pulmonale: Secondary | ICD-10-CM

## 2023-04-24 MED ORDER — APIXABAN 5 MG PO TABS: 5.0000 mg | ORAL_TABLET | Freq: Two times a day (BID) | ORAL | 3 refills | Status: DC

## 2023-04-24 MED ORDER — ALBUTEROL SULFATE (2.5 MG/3ML) 0.083% IN NEBU: 2.5000 mg | INHALATION_SOLUTION | Freq: Four times a day (QID) | RESPIRATORY_TRACT | 5 refills | Status: DC | PRN

## 2023-04-24 NOTE — Telephone Encounter (Signed)
Medication was not refilled on the e-prescribing system, pt unable to pick up at the pharmacy. Electronic RF sent. Contacted pt to inform her pharmacy received RF on 04/24/23 at 1510.

## 2023-04-24 NOTE — Telephone Encounter (Signed)
Copied from CRM [id number]. Topic: Clinical - Medication Refill >> Apr 24, 2023  1:48 PM Tiffany H wrote: Most Recent Primary Care Visit:  Provider: [name]  Department: [facility]  Visit Type: HOSPITAL FU  Date: 04/08/2023  Medication: apixaban (ELIQUIS) 5 MG TABS tablet - please expedite refill. [facility] isn't acknowledging patient's refills. Please assist.   Has the patient contacted their pharmacy? Yes (Agent: If no, request that the patient contact the pharmacy for the refill. If patient does not wish to contact the pharmacy document the reason why and proceed with request.) (Agent: If yes, when and what did the pharmacy advise?)  Is this the correct pharmacy for this prescription? Yes If no, delete pharmacy and type the correct one.  This is the patient's preferred pharmacy:   [id number] [place], [place] - [phone] AT [facility] OF [facility] & [address] [place] [address] Phone: [phone] Fax: [phone]   Has the prescription been filled recently? Yes  Is the patient out of the medication? Yes  Has the patient been seen for an appointment in the last year OR does the patient have an upcoming appointment? Yes  Can we respond through MyChart? Yes  Agent: Please be advised that Rx refills may take up to 3 business days. We ask that you follow-up with your pharmacy.

## 2023-04-24 NOTE — Telephone Encounter (Signed)
Rx refilled by Larey Dresser, RN today

## 2023-04-27 ENCOUNTER — Ambulatory Visit: Payer: Medicare PPO | Admitting: Primary Care

## 2023-04-27 DIAGNOSIS — C3490 Malignant neoplasm of unspecified part of unspecified bronchus or lung: Secondary | ICD-10-CM | POA: Diagnosis not present

## 2023-04-29 NOTE — Progress Notes (Deleted)
 Office Visit Note  Patient: Susan Holder             Date of Birth: 1955/05/27           MRN: [id number]             PCP: Catheryn Cluck, MD Referring: Catheryn Cluck, MD Visit Date: 05/13/2023 Occupation: @GUAROCC @  Subjective:  No chief complaint on file.   History of Present Illness: Susan Holder is a 68 y.o. female ***     Activities of Daily Living:  Patient reports morning stiffness for *** {minute/hour:19697}.   Patient {ACTIONS;DENIES/REPORTS:21021675::"Denies"} nocturnal pain.  Difficulty dressing/grooming: {ACTIONS;DENIES/REPORTS:21021675::"Denies"} Difficulty climbing stairs: {ACTIONS;DENIES/REPORTS:21021675::"Denies"} Difficulty getting out of chair: {ACTIONS;DENIES/REPORTS:21021675::"Denies"} Difficulty using hands for taps, buttons, cutlery, and/or writing: {ACTIONS;DENIES/REPORTS:21021675::"Denies"}  No Rheumatology ROS completed.   PMFS History:  Patient Active Problem List   Diagnosis Date Noted   Acute pulmonary embolism without acute cor pulmonale (HCC) 03/09/2023   Centrilobular emphysema (HCC) 11/20/2022   Malignant pleural effusion 11/20/2022   Chronic respiratory failure with hypoxia (HCC) 11/20/2022   Pneumonitis 03/11/2022   Adenocarcinoma of right lung, stage 4 (HCC) 02/20/2022   Unsteady gait when walking 11/19/2021   Dizziness 11/12/2021   S/P Left sided pigtail catheter placement 05/13/2021   Anemia 04/23/2021   Pleural effusion 04/12/2021   Malnutrition of moderate degree 02/09/2021   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   Aortic atherosclerosis (HCC) 10/23/2020   Coronary artery calcification seen on CT scan 10/23/2020   Pericardial effusion 10/23/2020   met lung ca 10/22/2020   Rheumatoid arthritis (HCC) 10/22/2020   Metastasis to brain (HCC) 03/29/2019   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) 11/04/2018   Acid reflux 09/21/2018   Hoarseness 03/16/2018   Encounter for antineoplastic immunotherapy  02/01/2018   Primary malignant neoplasm of bronchus of right lower lobe (HCC) 12/21/2017   Adenocarcinoma of right lung, stage 4 (HCC) 11/17/2017   Encounter for antineoplastic chemotherapy 11/17/2017   Goals of care, counseling/discussion 11/17/2017   Pulmonary nodules    Left hand pain 03/06/2017   Rheumatoid arthritis involving multiple sites with positive rheumatoid factor (HCC) 05/29/2016   ANA positive 05/29/2016   Vitamin D  deficiency 05/29/2016   High risk medication use 05/29/2016   Trigger finger, left ring finger 05/29/2016   Trigger finger, right ring finger 05/29/2016   DDD (degenerative disc disease), cervical 05/29/2016   Smoker 05/29/2016   Other fatigue 05/29/2016   Allergic rhinitis 08/22/2013    Past Medical History:  Diagnosis Date   Acid reflux 09/21/2018   Adenocarcinoma of right lung, stage 4 (HCC) 11/17/2017   ADHD (attention deficit hyperactivity disorder)    patient denies this dx as of 03/10/22   Allergic rhinitis 08/22/2013   ANA positive 05/29/2016   Anemia    Aortic atherosclerosis (HCC) 10/23/2020   Brain metastases 03/29/2019   COPD (chronic obstructive pulmonary disease) (HCC)    Coronary artery calcification seen on CT scan 10/23/2020   patient is unaware of this dx as of 03/10/22   DDD (degenerative disc disease), cervical 05/29/2016   patient denies this dx as of 03/10/22   Dyspnea    oxygen  2L via [place]   Encounter for antineoplastic chemotherapy 11/17/2017   Encounter for antineoplastic immunotherapy 02/01/2018   Goals of care, counseling/discussion 11/17/2017   High risk medication use 05/29/2016   04/29/2016: ==> plq 200 am & 100 qhs(adeq response).   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) 11/04/2018   Other fatigue 05/29/2016    Oxygen  dependent    2L via [place]   Pericardial effusion 10/23/2020   Pneumonia    as a child   Primary malignant neoplasm of bronchus of right lower lobe (HCC) 12/21/2017   Rheumatoid arthritis (HCC)    Rheumatoid arthritis involving multiple sites with positive rheumatoid factor (HCC) 05/29/2016   +RF +ANA +CCP    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   Smoker 05/29/2016   quit 2019   Trigger finger, left ring finger 05/29/2016   Trigger finger, right ring finger 05/29/2016   Vitamin D  deficiency 05/29/2016    Family History  Problem Relation Age of Onset   Stroke Mother    Alzheimer's disease Mother    Heart disease Mother    Emphysema Father    Hypertension Brother    Heart attack Maternal Aunt    Heart failure Maternal Grandmother    Hypertension Paternal Grandmother    Past Surgical History:  Procedure Laterality Date   BRONCHIAL BIOPSY  03/11/2022   Procedure: BRONCHIAL BIOPSIES;  Surgeon: Gloriajean Large, MD;  Location: [facility] ENDOSCOPY;  Service: Pulmonary;;   BRONCHIAL BIOPSY  11/07/2022   Procedure: BRONCHIAL BIOPSIES;  Surgeon: Gloriajean Large, MD;  Location: [facility] ENDOSCOPY;  Service: Pulmonary;;   BRONCHIAL BRUSHINGS  11/07/2022   Procedure: BRONCHIAL BRUSHINGS;  Surgeon: Gloriajean Large, MD;  Location: [facility] ENDOSCOPY;  Service: Pulmonary;;   BRONCHIAL NEEDLE ASPIRATION BIOPSY  11/09/2017   Procedure: BRONCHIAL NEEDLE ASPIRATION BIOPSIES;  Surgeon: Phyllis Breeze, MD;  Location: [facility] ENDOSCOPY;  Service: Cardiopulmonary;;   BRONCHIAL NEEDLE ASPIRATION BIOPSY  11/07/2022   Procedure: BRONCHIAL NEEDLE ASPIRATION BIOPSIES;  Surgeon: Gloriajean Large, MD;  Location: [facility] ENDOSCOPY;  Service: Pulmonary;;   BRONCHIAL WASHINGS  03/11/2022   Procedure: BRONCHIAL WASHINGS;  Surgeon: Gloriajean Large, MD;  Location: [facility] ENDOSCOPY;  Service: Pulmonary;;   CHEST TUBE INSERTION Left 05/13/2021   Procedure: INSERTION PLEURAL DRAINAGE CATHETER;  Surgeon: Hilarie Lovely,  MD;  Location: [facility] OR;  Service: Thoracic;  Laterality: Left;   CHEST TUBE INSERTION Right 03/11/2022   Procedure: INSERTION PLEURAL DRAINAGE CATHETER;  Surgeon: Gloriajean Large, MD;  Location: [facility] ENDOSCOPY;  Service: Pulmonary;  Laterality: Right;  indwelling pleural catheter, w/ cuff   ENDOBRONCHIAL ULTRASOUND Bilateral 11/09/2017   Procedure: ENDOBRONCHIAL ULTRASOUND;  Surgeon: Mannam, Praveen, MD;  Location: [facility] ENDOSCOPY;  Service: Cardiopulmonary;  Laterality: Bilateral;   FIDUCIAL MARKER PLACEMENT  11/07/2022   Procedure: FIDUCIAL MARKER PLACEMENT;  Surgeon: Gloriajean Large, MD;  Location: [facility] ENDOSCOPY;  Service: Pulmonary;;   NASAL SINUS SURGERY     NASAL SINUS SURGERY     REMOVAL OF PLEURAL DRAINAGE CATHETER N/A 04/16/2023   Procedure: MINOR REMOVAL OF PLEURAL DRAINAGE CATHETER;  Surgeon: Joesph Mussel, DO;  Location: [facility] ENDOSCOPY;  Service: Cardiopulmonary;  Laterality: N/A;   THORACENTESIS Right 02/07/2021   Procedure: THORACENTESIS;  Surgeon: Hilarie Lovely, MD;  Location: [facility] OR;  Service: Thoracic;  Laterality: Right;   TUBAL LIGATION     VIDEO BRONCHOSCOPY  11/09/2017   Procedure: VIDEO BRONCHOSCOPY;  Surgeon: Mannam, Praveen, MD;  Location: [facility] ENDOSCOPY;  Service: Cardiopulmonary;;   VIDEO BRONCHOSCOPY Left 03/11/2022   Procedure: VIDEO BRONCHOSCOPY WITH FLUORO;  Surgeon: Gloriajean Large, MD;  Location: [facility] ENDOSCOPY;  Service: Pulmonary;  Laterality: Left;   VIDEO BRONCHOSCOPY WITH RADIAL ENDOBRONCHIAL ULTRASOUND  11/07/2022   Procedure: VIDEO BRONCHOSCOPY WITH RADIAL ENDOBRONCHIAL ULTRASOUND;  Surgeon: Gloriajean Large, MD;  Location: [facility] ENDOSCOPY;  Service: Pulmonary;;   XI ROBOTIC ASSISTED PERICARDIAL WINDOW Left 02/07/2021   Procedure: XI ROBOTIC ASSISTED THORACOSCOPY PERICARDIAL WINDOW;  Surgeon: Hilarie Lovely, MD;  Location: [facility] OR;  Service: Thoracic;  Laterality: Left;   Social History   Social History Narrative   Not on file   Immunization History   Administered Date(s) Administered   Covid-19 Iv Non-us  Vaccine (Bibp, Sinopharm) 12/28/2022   Influenza, High Dose Seasonal PF 01/22/2019   Influenza,inj,Quad PF,6+ Mos 03/08/2018   Influenza-Unspecified 02/08/2020, 02/16/2022, 12/29/2022   PFIZER(Purple Top)SARS-COV-2 Vaccination 06/02/2019, 06/23/2019, 12/24/2019     Objective: Vital Signs: LMP 04/28/2000    Physical Exam   Musculoskeletal Exam: ***  CDAI Exam: CDAI Score: -- Patient Global: --; Provider Global: -- Swollen: --; Tender: -- Joint Exam 05/13/2023   No joint exam has been documented for this visit   There is currently no information documented on the homunculus. Go to the Rheumatology activity and complete the homunculus joint exam.  Investigation: No additional findings.  Imaging: DG Chest 2 View Result Date: 04/23/2023 CLINICAL DATA:  Pleural effusion EXAM: CHEST - 2 VIEW COMPARISON:  04/14/2023 FINDINGS: Interval removal of the right chest tube. Slightly increased large right partially loculated pleural effusion. Increased airspace opacities in the right lung. Persistent bandlike opacity in the left mid lung with a fiduciary marker. Similar blunting of the left costophrenic angle. No pneumothorax. IMPRESSION: 1. Interval removal of the right PleurX catheter. 2. Slightly increased right pleural effusion and associated airspace opacities in the right lung compared to 04/14/2023. Electronically Signed   By: Rozell Cornet M.D.   On: 04/23/2023 11:09   DG Chest 2 View Result Date: 04/14/2023 CLINICAL DATA:  Cough.  Pleural effusion.  PleurX not draining EXAM: CHEST - 2 VIEW COMPARISON:  Chest x-ray 04/06/2023 and older. CT scan 2024 November FINDINGS: Moderate right effusion with PleurX catheter in place. Catheter has a similar orientation on today's x-ray extending posteriorly. Adjacent right lung base opacity. Persistent bandlike nodular opacity left midlung with a fiduciary marker. Blunting of the left  costophrenic angle is stable, tiny effusion versus thickening. No pneumothorax seen. Normal cardiopericardial silhouette. Degenerative changes of the spine. IMPRESSION: No significant interval change. Persistent right-sided pleural effusion with a PleurX catheter in a similar orientation to prior x-ray. Please correlate with clinical findings. Electronically Signed   By: Adrianna Horde M.D.   On: 04/14/2023 14:39   DG Chest 2 View Result Date: 04/06/2023 CLINICAL DATA:  Malignant pleural effusion. EXAM: CHEST - 2 VIEW COMPARISON:  03/08/2023 and CT chest 03/09/2023. FINDINGS: Trachea is deviated slightly to the right, unchanged. Heart is enlarged. Increased linear consolidation in the left perihilar region, with an associated fiducial marker, possibly related to radiation therapy. Post radiation scarring in the perihilar right hemithorax. Moderate to large partially loculated right pleural effusion with PleurX catheter in place. IMPRESSION: 1. Increasing linear consolidation in the left perihilar region, likely due to evolving changes of radiation therapy. 2. Moderate to large partially loculated right pleural effusion with PleurX catheter in place, stable. 3. Post radiation scarring in the perihilar right hemithorax. Electronically Signed   By: Shearon Denis M.D.   On: 04/06/2023 16:24    Recent Labs: Lab Results  Component Value  Date   WBC 13.9 (H) 03/11/2023   HGB 10.2 (L) 03/11/2023   PLT 299 03/11/2023   NA 139 04/08/2023   K 3.6 04/08/2023   CL 98 04/08/2023   CO2 32 04/08/2023   GLUCOSE 92 04/08/2023   BUN 11 04/08/2023   CREATININE 1.09 04/08/2023   BILITOT 0.4 02/11/2023   ALKPHOS 95 02/11/2023   AST 22 02/11/2023   ALT 20 02/11/2023   PROT 6.9 02/11/2023   ALBUMIN 3.3 (L) 02/11/2023   CALCIUM 9.7 04/08/2023   GFRAA >60 01/17/2020    Speciality Comments: PLQ eye exam:  01/26/2023 WNL @ [facility]. Follow up in 1 year.  Called to obtain recent eye exam from  2024-  Procedures:  No procedures performed Allergies: Patient has no known allergies.   Assessment / Plan:     Visit Diagnoses: Rheumatoid arthritis involving multiple sites with positive rheumatoid factor (HCC)  High risk medication use  DDD (degenerative disc disease), cervical  Adenocarcinoma of right lung, stage 3 (HCC)  History of vitamin D  deficiency  Gait instability  Acquired trigger finger of both middle fingers  Orders: No orders of the defined types were placed in this encounter.  No orders of the defined types were placed in this encounter.   Face-to-face time spent with patient was *** minutes. Greater than 50% of time was spent in counseling and coordination of care.  Follow-Up Instructions: No follow-ups on file.   Romayne Clubs, PA-C  Note - This record has been created using Dragon software.  Chart creation errors have been sought, but may not always  have been located. Such creation errors do not reflect on  the standard of medical care.

## 2023-04-30 ENCOUNTER — Ambulatory Visit: Payer: Medicare PPO | Admitting: Nurse Practitioner

## 2023-04-30 ENCOUNTER — Other Ambulatory Visit: Payer: Self-pay | Admitting: Radiation Therapy

## 2023-04-30 ENCOUNTER — Ambulatory Visit (HOSPITAL_COMMUNITY): Payer: Medicare PPO | Attending: Nurse Practitioner

## 2023-04-30 DIAGNOSIS — I429 Cardiomyopathy, unspecified: Secondary | ICD-10-CM | POA: Insufficient documentation

## 2023-04-30 DIAGNOSIS — R000 Tachycardia, unspecified: Secondary | ICD-10-CM | POA: Insufficient documentation

## 2023-04-30 DIAGNOSIS — R0602 Shortness of breath: Secondary | ICD-10-CM | POA: Insufficient documentation

## 2023-04-30 DIAGNOSIS — I2510 Atherosclerotic heart disease of native coronary artery without angina pectoris: Secondary | ICD-10-CM | POA: Insufficient documentation

## 2023-04-30 DIAGNOSIS — C7931 Secondary malignant neoplasm of brain: Secondary | ICD-10-CM

## 2023-04-30 LAB — ECHOCARDIOGRAM COMPLETE
Area-P 1/2: 7.74 cm2
S' Lateral: 2.5 cm

## 2023-04-30 NOTE — Progress Notes (Deleted)
 Office Visit    Patient Name: Susan Holder Date of Encounter: 04/30/2023  Primary Care Provider:  Sebastian Beverley NOVAK, MD Primary Cardiologist:  Jennifer JONELLE Crape, MD  Chief Complaint    68 year old female with a history of coronary artery calcification noted on CT, cardiomyopathy, cardiomyopathy, pericardial effusion, non-small cell lung cancer, malignant pleural effusion, PE, COPD on home O2 at 2L/min, anemia, and rheumatoid arthritis who presents for hospital follow-up related to PE.    Past Medical History    Past Medical History:  Diagnosis Date   Acid reflux 09/21/2018   Adenocarcinoma of right lung, stage 4 (HCC) 11/17/2017   ADHD (attention deficit hyperactivity disorder)    patient denies this dx as of 03/10/22   Allergic rhinitis 08/22/2013   ANA positive 05/29/2016   Anemia    Aortic atherosclerosis (HCC) 10/23/2020   Brain metastases 03/29/2019   COPD (chronic obstructive pulmonary disease) (HCC)    Coronary artery calcification seen on CT scan 10/23/2020   patient is unaware of this dx as of 03/10/22   DDD (degenerative disc disease), cervical 05/29/2016   patient denies this dx as of 03/10/22   Dyspnea    oxygen  2L via [place]   Encounter for antineoplastic chemotherapy 11/17/2017   Encounter for antineoplastic immunotherapy 02/01/2018   Goals of care, counseling/discussion 11/17/2017   High risk medication use 05/29/2016   04/29/2016: ==> plq 200 am & 100 qhs(adeq response).   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   Other fatigue 05/29/2016   Oxygen  dependent    2L via [place]   Pericardial effusion 10/23/2020   Pneumonia    as a child   Primary malignant neoplasm of bronchus of right lower lobe (HCC) 12/21/2017   Rheumatoid arthritis (HCC)     Rheumatoid arthritis involving multiple sites with positive rheumatoid factor (HCC) 05/29/2016   +RF +ANA +CCP    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   Smoker 05/29/2016   quit 2019   Trigger finger, left ring finger 05/29/2016   Trigger finger, right ring finger 05/29/2016   Vitamin D  deficiency 05/29/2016   Past Surgical History:  Procedure Laterality Date   BRONCHIAL BIOPSY  03/11/2022   Procedure: BRONCHIAL BIOPSIES;  Surgeon: Gladis Leonor HERO, MD;  Location: [facility] ENDOSCOPY;  Service: Pulmonary;;   BRONCHIAL BIOPSY  11/07/2022   Procedure: BRONCHIAL BIOPSIES;  Surgeon: Gladis Leonor HERO, MD;  Location: [facility] ENDOSCOPY;  Service: Pulmonary;;   BRONCHIAL BRUSHINGS  11/07/2022   Procedure: BRONCHIAL BRUSHINGS;  Surgeon: Gladis Leonor HERO, MD;  Location: [facility] ENDOSCOPY;  Service: Pulmonary;;   BRONCHIAL NEEDLE ASPIRATION BIOPSY  11/09/2017   Procedure: BRONCHIAL NEEDLE ASPIRATION BIOPSIES;  Surgeon: Theophilus Roosevelt, MD;  Location: [facility] ENDOSCOPY;  Service: Cardiopulmonary;;   BRONCHIAL NEEDLE ASPIRATION BIOPSY  11/07/2022   Procedure: BRONCHIAL NEEDLE ASPIRATION BIOPSIES;  Surgeon: Gladis Leonor HERO, MD;  Location: [facility] ENDOSCOPY;  Service: Pulmonary;;   BRONCHIAL WASHINGS  03/11/2022   Procedure: BRONCHIAL WASHINGS;  Surgeon: Gladis Leonor HERO, MD;  Location: [facility] ENDOSCOPY;  Service: Pulmonary;;   CHEST TUBE INSERTION Left 05/13/2021   Procedure: INSERTION PLEURAL DRAINAGE CATHETER;  Surgeon: Shyrl Linnie KIDD, MD;  Location: [facility] OR;  Service: Thoracic;  Laterality: Left;  CHEST TUBE INSERTION Right 03/11/2022   Procedure: INSERTION PLEURAL DRAINAGE CATHETER;  Surgeon: Gladis Leonor HERO, MD;  Location: [facility] ENDOSCOPY;  Service: Pulmonary;  Laterality: Right;  indwelling pleural catheter, w/ cuff   ENDOBRONCHIAL ULTRASOUND Bilateral 11/09/2017   Procedure: ENDOBRONCHIAL ULTRASOUND;  Surgeon: Mannam, Praveen, MD;  Location: [facility] ENDOSCOPY;  Service: Cardiopulmonary;  Laterality: Bilateral;    FIDUCIAL MARKER PLACEMENT  11/07/2022   Procedure: FIDUCIAL MARKER PLACEMENT;  Surgeon: Gladis Leonor HERO, MD;  Location: [facility] ENDOSCOPY;  Service: Pulmonary;;   NASAL SINUS SURGERY     NASAL SINUS SURGERY     REMOVAL OF PLEURAL DRAINAGE CATHETER N/A 04/16/2023   Procedure: MINOR REMOVAL OF PLEURAL DRAINAGE CATHETER;  Surgeon: Gretta Leita SQUIBB, DO;  Location: [facility] ENDOSCOPY;  Service: Cardiopulmonary;  Laterality: N/A;   THORACENTESIS Right 02/07/2021   Procedure: THORACENTESIS;  Surgeon: Shyrl Linnie KIDD, MD;  Location: [facility] OR;  Service: Thoracic;  Laterality: Right;   TUBAL LIGATION     VIDEO BRONCHOSCOPY  11/09/2017   Procedure: VIDEO BRONCHOSCOPY;  Surgeon: Mannam, Praveen, MD;  Location: [facility] ENDOSCOPY;  Service: Cardiopulmonary;;   VIDEO BRONCHOSCOPY Left 03/11/2022   Procedure: VIDEO BRONCHOSCOPY WITH FLUORO;  Surgeon: Gladis Leonor HERO, MD;  Location: [facility] ENDOSCOPY;  Service: Pulmonary;  Laterality: Left;   VIDEO BRONCHOSCOPY WITH RADIAL ENDOBRONCHIAL ULTRASOUND  11/07/2022   Procedure: VIDEO BRONCHOSCOPY WITH RADIAL ENDOBRONCHIAL ULTRASOUND;  Surgeon: Gladis Leonor HERO, MD;  Location: [facility] ENDOSCOPY;  Service: Pulmonary;;   XI ROBOTIC ASSISTED PERICARDIAL WINDOW Left 02/07/2021   Procedure: XI ROBOTIC ASSISTED THORACOSCOPY PERICARDIAL WINDOW;  Surgeon: Shyrl Linnie KIDD, MD;  Location: [facility] OR;  Service: Thoracic;  Laterality: Left;    Allergies  No Known Allergies   Labs/Other Studies Reviewed    The following studies were reviewed today:  Cardiac Studies & Procedures      ECHOCARDIOGRAM  ECHOCARDIOGRAM COMPLETE 03/09/2023  Narrative ECHOCARDIOGRAM REPORT    Patient Name:   Susan Holder Date of Exam: 03/09/2023 Medical Rec #:  [id number]         Height:       66.0 in Accession #:    [id number]        Weight:       151.0 lb Date of Birth:  1956-01-29          BSA:          1.775 m Patient Age:    67 years          BP:           120/69 mmHg Patient Gender: F                  HR:           97 bpm. Exam Location:  Inpatient  Procedure: 2D Echo, Cardiac Doppler and Color Doppler  Indications:    Pulmonary Embolus I26.09  History:        Patient has prior history of Echocardiogram examinations, most recent 11/09/2020. COPD.  Sonographer:    Tinnie Gosling RDCS Referring Phys: [phone] JARED M GARDNER  IMPRESSIONS   1. Left ventricular ejection fraction, by estimation, is 45 to 50%. The left ventricle has mildly decreased function. The left ventricle demonstrates global hypokinesis appearing worse at the apex. There is mild concentric left ventricular hypertrophy. Left ventricular diastolic parameters are consistent with Grade I diastolic dysfunction (impaired relaxation). 2. D-shaped septum suggestive of RV pressure/volume overload. Right ventricular systolic function is mildly reduced. The right ventricular size is mildly  enlarged. There is mildly elevated pulmonary artery systolic pressure. The estimated right ventricular systolic pressure is 41.9 mmHg. 3. The mitral valve is normal in structure. Trivial mitral valve regurgitation. No evidence of mitral stenosis. 4. The tricuspid valve is abnormal. Tricuspid valve regurgitation is moderate. 5. The aortic valve is tricuspid. Aortic valve regurgitation is moderate. No aortic stenosis is present. 6. The inferior vena cava is normal in size with greater than 50% respiratory variability, suggesting right atrial pressure of 3 mmHg.  FINDINGS Left Ventricle: Left ventricular ejection fraction, by estimation, is 45 to 50%. The left ventricle has mildly decreased function. The left ventricle demonstrates global hypokinesis. The left ventricular internal cavity size was normal in size. There is mild concentric left ventricular hypertrophy. Left ventricular diastolic parameters are consistent with Grade I diastolic dysfunction (impaired relaxation).  Right Ventricle: D-shaped septum suggestive of RV pressure/volume  overload. The right ventricular size is mildly enlarged. No increase in right ventricular wall thickness. Right ventricular systolic function is mildly reduced. There is mildly elevated pulmonary artery systolic pressure. The tricuspid regurgitant velocity is 3.12 m/s, and with an assumed right atrial pressure of 3 mmHg, the estimated right ventricular systolic pressure is 41.9 mmHg.  Left Atrium: Left atrial size was normal in size.  Right Atrium: Right atrial size was normal in size.  Pericardium: There is no evidence of pericardial effusion.  Mitral Valve: The mitral valve is normal in structure. Trivial mitral valve regurgitation. No evidence of mitral valve stenosis.  Tricuspid Valve: The tricuspid valve is abnormal. Tricuspid valve regurgitation is moderate.  Aortic Valve: The aortic valve is tricuspid. Aortic valve regurgitation is moderate. Aortic regurgitation PHT measures 800 msec. No aortic stenosis is present.  Pulmonic Valve: The pulmonic valve was normal in structure. Pulmonic valve regurgitation is not visualized.  Aorta: The aortic root is normal in size and structure.  Venous: The inferior vena cava is normal in size with greater than 50% respiratory variability, suggesting right atrial pressure of 3 mmHg.  IAS/Shunts: No atrial level shunt detected by color flow Doppler.   LEFT VENTRICLE PLAX 2D LVIDd:         3.90 cm   Diastology LVIDs:         2.90 cm   LV e' medial:    9.14 cm/s LV PW:         1.00 cm   LV E/e' medial:  6.8 LV IVS:        1.10 cm   LV e' lateral:   15.60 cm/s LVOT diam:     1.90 cm   LV E/e' lateral: 4.0 LV SV:         31 LV SV Index:   17 LVOT Area:     2.84 cm   RIGHT VENTRICLE            IVC RV S prime:     9.57 cm/s  IVC diam: 1.30 cm TAPSE (M-mode): 1.1 cm  LEFT ATRIUM             Index       RIGHT ATRIUM           Index LA diam:        2.50 cm 1.41 cm/m  RA Area:     10.50 cm LA Vol (A2C):   9.4 ml  5.30 ml/m  RA Volume:    21.70 ml  12.23 ml/m LA Vol (A4C):   10.5 ml 5.92 ml/m LA Biplane Vol: 9.9 ml  5.60 ml/m AORTIC VALVE LVOT Vmax:   65.50 cm/s LVOT Vmean:  44.600 cm/s LVOT VTI:    0.108 m AI PHT:      800 msec  AORTA Ao Root diam: 2.80 cm Ao Asc diam:  2.80 cm  MITRAL VALVE               TRICUSPID VALVE MV Area (PHT): 3.83 cm    TR Peak grad:   38.9 mmHg MV Decel Time: 198 msec    TR Vmax:        312.00 cm/s MV E velocity: 62.40 cm/s MV A velocity: 73.40 cm/s  SHUNTS MV E/A ratio:  0.85        Systemic VTI:  0.11 m Systemic Diam: 1.90 cm  Dalton McleanMD Electronically signed by Ezra Kanner Signature Date/Time: 03/09/2023/9:25:05 AM    Final            Recent Labs: 06/17/2022: TSH 3.033 02/11/2023: ALT 20 03/11/2023: Hemoglobin 10.2; Platelets 299 04/08/2023: BUN 11; Creatinine, Ser 1.09; Potassium 3.6; Sodium 139  Recent Lipid Panel No results found for: CHOL, TRIG, HDL, CHOLHDL, VLDL, LDLCALC, LDLDIRECT  History of Present Illness    68 year old female with the above past medical history including coronary artery calcification noted on CT, cardiomyopathy, pericardial effusion, non-small cell lung cancer, malignant pleural effusion, PE, COPD on home O2 at 2L/min, anemia, and rheumatoid arthritis.   She was referred to cardiology in 09/2020 in the setting of cc pericardial effusion noted on CT scan.  There was also evidence of coronary artery calcification and aortic atherosclerosis on CT.  She was also noted to have sinus tachycardia.  Echocardiogram in 10/2020 revealed EF 40 to 45%, mildly decreased LV function, LV global hypokinesis, G1 DD, moderately reduced RV, moderate circumferential pericardial effusion without evidence of tamponade.  She was referred to CT surgery and underwent robotic assisted pericardial window in 01/2021.  Additionally, she has a history of non-small cell lung cancer with metastases to brain, malignant pleural effusion s/p Pleurx catheter.   She is following with pulmonology and oncology.  Her oncologist on 02/17/2023 and noted elevated heart rate, lower extremity edema.  She was last seen in the office on 10/28/20204 and was stable overall.  She drove noted generalized weakness, fatigue, chest heaviness associated with draining of Pleurx catheter.  She noted new urinary incontinence.  She was hospitalized in 03/18/2023 in the setting of PE, bilateral lower extremity DVT.  Repeat echocardiogram showed EF 45 to 50%, mildly decreased LV function, mild concentric LVH, G1 DD, tricuspid valve regurgitation, moderate aortic valve regurgitation.-She was discharged home in stable condition on 03/12/2023.   She presents today for follow-up accompanied by her cousin.  Since her last visit she has been  1. History of pericardial effusion/Tachycardia: S/p pericardial window in 01/2021.  Recent increase in shortness of breath with exertion, sinus tachycardia.  CT of chest on 02/11/2023 with no evidence of pericardial effusion.  EKG today shows sinus tachycardia.  She did recently complete a steroid taper.  She denies any significant palpitations, she does note dizziness with position changes, generalized fatigue, generalized weakness. Will repeat echocardiogram to rule out pericardial effusion given new tachycardia.  Reviewed ED precautions.   2. Bilateral lower extremity edema/Cardiomyopathy/dyspnea on exertion: Echo in 10/2020 revealed EF 40 to 45%, mildly decreased LV function, LV global hypokinesis, G1 DD, moderately reduced RV, moderate circumferential pericardial effusion without evidence of tamponade. Recent lower extremity edema. She does have mildly progressive dyspnea on exertion, stable  nonpitting bilateral lower extremity/ankle edema, generally euvolemic on exam. Denies PND, orthopnea, weight gain.  Reviewed with Dr. Francyne, who does not recommend Lasix  at this time.  Suspect her symptoms of dyspnea, generalized weakness, fatigue are  multifactorial.  Repeat echo pending as above.    3. Coronary artery calcification noted on CT: Stable with no anginal symptoms. No indication for ischemic evaluation.    4. COPD/non-small cell lung cancer/history of malignant pleural effusion: On home O2. S/p R Pleurx catheter.  Following with pulmonology/oncology.    5. Disposition:  Follow-up i Home Medications    Current Outpatient Medications  Medication Sig Dispense Refill   acetaminophen  (TYLENOL ) 500 MG tablet Take 1,000 mg by mouth every 6 (six) hours as needed for moderate pain.     albuterol  (PROVENTIL ) (2.5 MG/3ML) 0.083% nebulizer solution Take 3 mLs (2.5 mg total) by nebulization every 6 (six) hours as needed for shortness of breath. 75 mL 5   amoxicillin -clavulanate (AUGMENTIN ) 875-125 MG tablet Take 1 tablet by mouth 2 (two) times daily. 14 tablet 0   apixaban  (ELIQUIS ) 5 MG TABS tablet Take 1 tablet (5 mg total) by mouth 2 (two) times daily. 180 tablet 3   ascorbic acid (VITAMIN C) 500 MG tablet Take 500 mg by mouth daily.     bacitracin  500 UNIT/GM ointment Apply 1 Application topically 2 (two) times daily. (Patient not taking: Reported on 04/23/2023) 15 g 0   benzonatate  (TESSALON ) 200 MG capsule Take 1 capsule (200 mg total) by mouth 3 (three) times daily as needed for cough. 30 capsule 1   Budeson-Glycopyrrol-Formoterol (BREZTRI  AEROSPHERE) 160-9-4.8 MCG/ACT AERO Inhale 2 puffs into the lungs in the morning and at bedtime.     Cholecalciferol (VITAMIN D -3) 125 MCG (5000 UT) TABS Take 5,000 Units by mouth daily.     fluticasone  (FLONASE ) 50 MCG/ACT nasal spray Place 1 spray into both nostrils daily. 18.2 mL 2   furosemide  (LASIX ) 20 MG tablet Take 1 tablet (20 mg total) by mouth daily. 90 tablet 3   HYDROcodone  bit-homatropine (HYDROMET) 5-1.5 MG/5ML syrup Take 5 mLs by mouth every 8 (eight) hours as needed for cough. 120 mL 0   hydroxychloroquine  (PLAQUENIL ) 200 MG tablet Take 1 tablet (200 mg total) by mouth daily. 90  tablet 0   omeprazole (PRILOSEC) 20 MG capsule Take 20 mg by mouth daily.     potassium chloride  (KLOR-CON ) 10 MEQ tablet Take 1 tablet (10 mEq total) by mouth daily. 90 tablet 3   Tiotropium Bromide-Olodaterol (STIOLTO RESPIMAT ) 2.5-2.5 MCG/ACT AERS Inhale 2 puffs into the lungs daily. 4 g 5   No current facility-administered medications for this visit.     Review of Systems    ***.  All other systems reviewed and are otherwise negative except as noted above.    Physical Exam    VS:  LMP 04/28/2000  , BMI There is no height or weight on file to calculate BMI.     GEN: Well nourished, well developed, in no acute distress. HEENT: normal. Neck: Supple, no JVD, carotid bruits, or masses. Cardiac: RRR, no murmurs, rubs, or gallops. No clubbing, cyanosis, edema.  Radials/DP/PT 2+ and equal bilaterally.  Respiratory:  Respirations regular and unlabored, clear to auscultation bilaterally. GI: Soft, nontender, nondistended, BS + x 4. MS: no deformity or atrophy. Skin: warm and dry, no rash. Neuro:  Strength and sensation are intact. Psych: Normal affect.  Accessory Clinical Findings    ECG personally reviewed by me today -    -  no acute changes.   Lab Results  Component Value Date   WBC 13.9 (H) 03/11/2023   HGB 10.2 (L) 03/11/2023   HCT 32.9 (L) 03/11/2023   MCV 95.6 03/11/2023   PLT 299 03/11/2023   Lab Results  Component Value Date   CREATININE 1.09 04/08/2023   BUN 11 04/08/2023   NA 139 04/08/2023   K 3.6 04/08/2023   CL 98 04/08/2023   CO2 32 04/08/2023   Lab Results  Component Value Date   ALT 20 02/11/2023   AST 22 02/11/2023   ALKPHOS 95 02/11/2023   BILITOT 0.4 02/11/2023   No results found for: CHOL, HDL, LDLCALC, LDLDIRECT, TRIG, CHOLHDL  No results found for: HGBA1C  Assessment & Plan    1.  ***  No BP recorded.  {Refresh Note OR Click here to enter BP  :1}***   Damien JAYSON Braver, NP 04/30/2023, 6:25 AM

## 2023-05-04 ENCOUNTER — Telehealth: Payer: Self-pay

## 2023-05-04 NOTE — Progress Notes (Deleted)
 Office Visit    Patient Name: Susan Holder Date of Encounter: 05/04/2023  Primary Care Provider:  Sebastian Beverley NOVAK, MD Primary Cardiologist:  Susan JONELLE Crape, MD  Chief Complaint    68 year old female with a history of coronary artery calcification noted on CT, cardiomyopathy, cardiomyopathy, pericardial effusion, non-small cell lung cancer, malignant pleural effusion, PE, COPD on home O2 at 2L/min, anemia, and rheumatoid arthritis who presents for [facility] follow-up related to PE.    Past Medical History    Past Medical History:  Diagnosis Date   Acid reflux 09/21/2018   Adenocarcinoma of right lung, stage 4 (HCC) 11/17/2017   ADHD (attention deficit hyperactivity disorder)    patient denies this dx as of 03/10/22   Allergic rhinitis 08/22/2013   ANA positive 05/29/2016   Anemia    Aortic atherosclerosis (HCC) 10/23/2020   Brain metastases 03/29/2019   COPD (chronic obstructive pulmonary disease) (HCC)    Coronary artery calcification seen on CT scan 10/23/2020   patient is unaware of this dx as of 03/10/22   DDD (degenerative disc disease), cervical 05/29/2016   patient denies this dx as of 03/10/22   Dyspnea    oxygen  2L via [place]   Encounter for antineoplastic chemotherapy 11/17/2017   Encounter for antineoplastic immunotherapy 02/01/2018   Goals of care, counseling/discussion 11/17/2017   High risk medication use 05/29/2016   04/29/2016: ==> plq 200 am & 100 qhs(adeq response).   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   Other fatigue 05/29/2016   Oxygen  dependent    2L via [place]   Pericardial effusion 10/23/2020   Pneumonia    as a child   Primary malignant neoplasm of bronchus of right lower lobe (HCC) 12/21/2017   Rheumatoid arthritis (HCC)     Rheumatoid arthritis involving multiple sites with positive rheumatoid factor (HCC) 05/29/2016   +RF +ANA +CCP    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   Smoker 05/29/2016   quit 2019   Trigger finger, left ring finger 05/29/2016   Trigger finger, right ring finger 05/29/2016   Vitamin D  deficiency 05/29/2016   Past Surgical History:  Procedure Laterality Date   BRONCHIAL BIOPSY  03/11/2022   Procedure: BRONCHIAL BIOPSIES;  Surgeon: Susan Leonor HERO, MD;  Location: [facility];  Service: Pulmonary;;   BRONCHIAL BIOPSY  11/07/2022   Procedure: BRONCHIAL BIOPSIES;  Surgeon: Susan Leonor HERO, MD;  Location: [facility];  Service: Pulmonary;;   BRONCHIAL BRUSHINGS  11/07/2022   Procedure: BRONCHIAL BRUSHINGS;  Surgeon: Susan Leonor HERO, MD;  Location: [facility];  Service: Pulmonary;;   BRONCHIAL NEEDLE ASPIRATION BIOPSY  11/09/2017   Procedure: BRONCHIAL NEEDLE ASPIRATION BIOPSIES;  Surgeon: Susan Roosevelt, MD;  Location: [facility];  Service: Cardiopulmonary;;   BRONCHIAL NEEDLE ASPIRATION BIOPSY  11/07/2022   Procedure: BRONCHIAL NEEDLE ASPIRATION BIOPSIES;  Surgeon: Susan Leonor HERO, MD;  Location: [facility];  Service: Pulmonary;;   BRONCHIAL WASHINGS  03/11/2022   Procedure: BRONCHIAL WASHINGS;  Surgeon: Susan Leonor HERO, MD;  Location: [facility];  Service: Pulmonary;;   CHEST TUBE INSERTION Left 05/13/2021   Procedure: INSERTION PLEURAL DRAINAGE CATHETER;  Surgeon: Susan Linnie KIDD, MD;  Location: [facility];  Service: Thoracic;  Laterality: Left;  CHEST TUBE INSERTION Right 03/11/2022   Procedure: INSERTION PLEURAL DRAINAGE CATHETER;  Surgeon: Susan Leonor HERO, MD;  Location: [facility];  Service: Pulmonary;  Laterality: Right;  indwelling pleural catheter, w/ cuff   ENDOBRONCHIAL ULTRASOUND Bilateral 11/09/2017   Procedure: ENDOBRONCHIAL ULTRASOUND;  Surgeon: Mannam, Praveen, MD;  Location: [facility];  Service: Cardiopulmonary;  Laterality: Bilateral;    FIDUCIAL MARKER PLACEMENT  11/07/2022   Procedure: FIDUCIAL MARKER PLACEMENT;  Surgeon: Susan Leonor HERO, MD;  Location: [facility];  Service: Pulmonary;;   NASAL SINUS SURGERY     NASAL SINUS SURGERY     REMOVAL OF PLEURAL DRAINAGE CATHETER N/A 04/16/2023   Procedure: MINOR REMOVAL OF PLEURAL DRAINAGE CATHETER;  Surgeon: Susan Leita SQUIBB, DO;  Location: [facility];  Service: Cardiopulmonary;  Laterality: N/A;   THORACENTESIS Right 02/07/2021   Procedure: THORACENTESIS;  Surgeon: Susan Linnie KIDD, MD;  Location: [facility];  Service: Thoracic;  Laterality: Right;   TUBAL LIGATION     VIDEO BRONCHOSCOPY  11/09/2017   Procedure: VIDEO BRONCHOSCOPY;  Surgeon: Mannam, Praveen, MD;  Location: [facility];  Service: Cardiopulmonary;;   VIDEO BRONCHOSCOPY Left 03/11/2022   Procedure: VIDEO BRONCHOSCOPY WITH FLUORO;  Surgeon: Susan Leonor HERO, MD;  Location: [facility];  Service: Pulmonary;  Laterality: Left;   VIDEO BRONCHOSCOPY WITH RADIAL ENDOBRONCHIAL ULTRASOUND  11/07/2022   Procedure: VIDEO BRONCHOSCOPY WITH RADIAL ENDOBRONCHIAL ULTRASOUND;  Surgeon: Susan Leonor HERO, MD;  Location: [facility];  Service: Pulmonary;;   XI ROBOTIC ASSISTED PERICARDIAL WINDOW Left 02/07/2021   Procedure: XI ROBOTIC ASSISTED THORACOSCOPY PERICARDIAL WINDOW;  Surgeon: Susan Linnie KIDD, MD;  Location: [facility];  Service: Thoracic;  Laterality: Left;    Allergies  No Known Allergies   Labs/Other Studies Reviewed    The following studies were reviewed today:  Cardiac Studies & Procedures      ECHOCARDIOGRAM  ECHOCARDIOGRAM COMPLETE 04/30/2023  Narrative ECHOCARDIOGRAM REPORT    Patient Name:   Susan [facility] Date of Exam: 04/30/2023 Medical Rec #:  [id number]         Height:       60.0 in Accession #:    [id number]        Weight:       157.6 lb Date of Birth:  [birth year]          BSA:          1.687 m Patient Age:    67 years          BP:           106/56 mmHg Patient Gender: F                  HR:           103 bpm. Exam Location:  [address]  Procedure: 2D Echo, Cardiac Doppler and Color Doppler  Indications:    I25.0 CAD VT I47.2 Cardiomyopathy I42.9 SOB R06.02  History:        Patient has prior history of Echocardiogram examinations, most recent 03/09/2023. Pericardial effusion s/p pericardial window and Cardiomyopathy, CAD, Lung cancer with metastases/chemotherapy, pulmonary embolus and COPD, Signs/Symptoms:Dyspnea, Shortness of Breath and Edema; Risk Factors:Former Smoker. Previous echo revealed LVEF 50% D-shaped septum PAP 41.9 mmHg.  Sonographer:    Nolon Berg BA, RDCS Referring Phys: [phone] DAMIEN JAYSON [facility]   Sonographer Comments: Suboptimal apical window. Patient unble to lay flat. Exam done with patient sitting upright. Patient on 4 liters of O2 and still short of breath with cough. IMPRESSIONS  1. Left ventricular ejection fraction, by estimation, is 55 to 60%. The left ventricle has normal function. The left ventricle has no regional wall motion abnormalities. Left ventricular diastolic parameters were normal. There is a diastolic septal bounce. 2. The RV is not well visualized; however, systolic function appears normal. The RV appears mildly enlarged. The right ventricular size is mildly enlarged. There is mildly elevated pulmonary artery systolic pressure. The estimated right ventricular systolic pressure is 38.3 mmHg. 3. The mitral valve is normal in structure. Trivial mitral valve regurgitation. 4. Tricuspid valve regurgitation is moderate. 5. The aortic valve is abnormal. There is mild calcification of the aortic valve. Aortic valve regurgitation is mild to moderate.  FINDINGS Left Ventricle: Left ventricular ejection fraction, by estimation, is 55 to 60%. The left ventricle has normal function. The left ventricle has no regional wall motion abnormalities. The left ventricular internal cavity size was normal in size. There is no left ventricular  hypertrophy. Left ventricular diastolic parameters were normal.  Right Ventricle: The right ventricular size is mildly enlarged. No increase in right ventricular wall thickness. Right ventricular systolic function is normal. There is mildly elevated pulmonary artery systolic pressure. The tricuspid regurgitant velocity is 2.97 m/s, and with an assumed right atrial pressure of 3 mmHg, the estimated right ventricular systolic pressure is 38.3 mmHg.  Left Atrium: Left atrial size was normal in size.  Right Atrium: Right atrial size was normal in size.  Pericardium: There is no evidence of pericardial effusion.  Mitral Valve: The mitral valve is normal in structure. Trivial mitral valve regurgitation.  Tricuspid Valve: The tricuspid valve is normal in structure. Tricuspid valve regurgitation is moderate.  Aortic Valve: The aortic valve is abnormal. There is mild calcification of the aortic valve. Aortic valve regurgitation is mild to moderate.  Pulmonic Valve: The pulmonic valve was normal in structure. Pulmonic valve regurgitation is not visualized.  Aorta: The aortic root is normal in size and structure.  IAS/Shunts: No atrial level shunt detected by color flow Doppler.   LEFT VENTRICLE PLAX 2D LVIDd:         4.00 cm   Diastology LVIDs:         2.50 cm   LV e' medial:    7.83 cm/s LV PW:         0.90 cm   LV E/e' medial:  11.2 LV IVS:        0.90 cm   LV e' lateral:   11.40 cm/s LVOT diam:     2.00 cm   LV E/e' lateral: 7.7 LV SV:         61 LV SV Index:   36 LVOT Area:     3.14 cm   RIGHT VENTRICLE            IVC RVSP:           48.3 mmHg  IVC diam: 1.80 cm  LEFT ATRIUM           Index        RIGHT ATRIUM            Index LA diam:      2.40 cm 1.42 cm/m   RA Pressure: 13.00 mmHg LA Vol (A2C): 17.3 ml 10.26 ml/m  RA Area:     11.40 cm LA Vol (A4C): 15.6 ml 9.25 ml/m   RA Volume:   25.30 ml   15.00 ml/m AORTIC VALVE LVOT Vmax:   111.00 cm/s LVOT Vmean:  72.200  cm/s  LVOT VTI:    0.194 m  AORTA Ao Root diam: 2.70 cm Ao Asc diam:  3.30 cm  MITRAL VALVE               TRICUSPID VALVE MV Area (PHT): 7.74 cm    TR Peak grad:   35.3 mmHg MV Decel Time: 98 msec     TR Vmax:        297.00 cm/s MV E velocity: 88.00 cm/s  Estimated RAP:  13.00 mmHg MV A velocity: 86.20 cm/s  RVSP:           48.3 mmHg MV E/A ratio:  1.02 SHUNTS Systemic VTI:  0.19 m Systemic Diam: 2.00 cm  Aditya Sabharwal Electronically signed by Ria Commander Signature Date/Time: 04/30/2023/6:19:16 PM    Final            Recent Labs: 06/17/2022: TSH 3.033 02/11/2023: ALT 20 03/11/2023: Hemoglobin 10.2; Platelets 299 04/08/2023: BUN 11; Creatinine, Ser 1.09; Potassium 3.6; Sodium 139  Recent Lipid Panel No results found for: CHOL, TRIG, HDL, CHOLHDL, VLDL, LDLCALC, LDLDIRECT  History of Present Illness    67 year old female with the above past medical history including coronary artery calcification noted on CT, cardiomyopathy, pericardial effusion, non-small cell lung cancer, malignant pleural effusion, PE, COPD on home O2 at 2L/min, anemia, and rheumatoid arthritis.   She was referred to cardiology in 09/2020 in the setting of cc pericardial effusion noted on CT scan.  There was also evidence of coronary artery calcification and aortic atherosclerosis on CT.  She was also noted to have sinus tachycardia.  Echocardiogram in 10/2020 revealed EF 40 to 45%, mildly decreased LV function, LV global hypokinesis, G1 DD, moderately reduced RV, moderate circumferential pericardial effusion without evidence of tamponade.  She was referred to CT surgery and underwent robotic assisted pericardial window in 01/2021.  Additionally, she has a history of non-small cell lung cancer with metastases to brain, malignant pleural effusion s/p Pleurx catheter.  She is following with pulmonology and oncology.  Her oncologist on 02/17/2023 and noted elevated heart rate, lower extremity  edema.  She was last seen in the office on 10/28/20204 and was stable overall.  She drove noted generalized weakness, fatigue, chest heaviness associated with draining of Pleurx catheter.  She noted new urinary incontinence.  She was hospitalized in 03/18/2023 in the setting of PE, bilateral lower extremity DVT.  Repeat echocardiogram showed EF 45 to 50%, mildly decreased LV function, mild concentric LVH, G1 DD, tricuspid valve regurgitation, moderate aortic valve regurgitation.-She was discharged home in stable condition on 03/12/2023.   She presents today for follow-up accompanied by her cousin.  Since her last visit she has been  1. History of pericardial effusion/Tachycardia: S/p pericardial window in 01/2021.  Recent increase in shortness of breath with exertion, sinus tachycardia.  CT of chest on 02/11/2023 with no evidence of pericardial effusion.  EKG today shows sinus tachycardia.  She did recently complete a steroid taper.  She denies any significant palpitations, she does note dizziness with position changes, generalized fatigue, generalized weakness. Will repeat echocardiogram to rule out pericardial effusion given new tachycardia.  Reviewed ED precautions.   2. Bilateral lower extremity edema/Cardiomyopathy/dyspnea on exertion: Echo in 10/2020 revealed EF 40 to 45%, mildly decreased LV function, LV global hypokinesis, G1 DD, moderately reduced RV, moderate circumferential pericardial effusion without evidence of tamponade. Recent lower extremity edema. She does have mildly progressive dyspnea on exertion, stable nonpitting bilateral lower extremity/ankle edema, generally euvolemic on exam. Denies PND,  orthopnea, weight gain.  Reviewed with Dr. Francyne, who does not recommend Lasix  at this time.  Suspect her symptoms of dyspnea, generalized weakness, fatigue are multifactorial.  Repeat echo pending as above.    3. Coronary artery calcification noted on CT: Stable with no anginal symptoms. No  indication for ischemic evaluation.    4. COPD/non-small cell lung cancer/history of malignant pleural effusion: On home O2. S/p R Pleurx catheter.  Following with pulmonology/oncology.    5. Disposition:  Follow-up i Home Medications    Current Outpatient Medications  Medication Sig Dispense Refill   acetaminophen  (TYLENOL ) 500 MG tablet Take 1,000 mg by mouth every 6 (six) hours as needed for moderate pain.     albuterol  (PROVENTIL ) (2.5 MG/3ML) 0.083% nebulizer solution Take 3 mLs (2.5 mg total) by nebulization every 6 (six) hours as needed for shortness of breath. 75 mL 5   amoxicillin -clavulanate (AUGMENTIN ) 875-125 MG tablet Take 1 tablet by mouth 2 (two) times daily. 14 tablet 0   apixaban  (ELIQUIS ) 5 MG TABS tablet Take 1 tablet (5 mg total) by mouth 2 (two) times daily. 180 tablet 3   ascorbic acid (VITAMIN C) 500 MG tablet Take 500 mg by mouth daily.     bacitracin  500 UNIT/GM ointment Apply 1 Application topically 2 (two) times daily. (Patient not taking: Reported on 04/23/2023) 15 g 0   benzonatate  (TESSALON ) 200 MG capsule Take 1 capsule (200 mg total) by mouth 3 (three) times daily as needed for cough. 30 capsule 1   Budeson-Glycopyrrol-Formoterol (BREZTRI  AEROSPHERE) 160-9-4.8 MCG/ACT AERO Inhale 2 puffs into the lungs in the morning and at bedtime.     Cholecalciferol (VITAMIN D -3) 125 MCG (5000 UT) TABS Take 5,000 Units by mouth daily.     fluticasone  (FLONASE ) 50 MCG/ACT nasal spray Place 1 spray into both nostrils daily. 18.2 mL 2   furosemide  (LASIX ) 20 MG tablet Take 1 tablet (20 mg total) by mouth daily. 90 tablet 3   HYDROcodone  bit-homatropine (HYDROMET) 5-1.5 MG/5ML syrup Take 5 mLs by mouth every 8 (eight) hours as needed for cough. 120 mL 0   hydroxychloroquine  (PLAQUENIL ) 200 MG tablet Take 1 tablet (200 mg total) by mouth daily. 90 tablet 0   omeprazole (PRILOSEC) 20 MG capsule Take 20 mg by mouth daily.     potassium chloride  (KLOR-CON ) 10 MEQ tablet Take 1 tablet  (10 mEq total) by mouth daily. 90 tablet 3   Tiotropium Bromide-Olodaterol (STIOLTO RESPIMAT ) 2.5-2.5 MCG/ACT AERS Inhale 2 puffs into the lungs daily. 4 g 5   No current facility-administered medications for this visit.     Review of Systems    ***.  All other systems reviewed and are otherwise negative except as noted above.    Physical Exam    VS:  LMP 04/28/2000  , BMI There is no height Holder weight on file to calculate BMI.     GEN: Well nourished, well developed, in no Susan distress. HEENT: normal. Neck: Supple, no JVD, carotid bruits, Holder masses. Cardiac: RRR, no murmurs, rubs, Holder gallops. No clubbing, cyanosis, edema.  Radials/DP/PT 2+ and equal bilaterally.  Respiratory:  Respirations regular and unlabored, clear to auscultation bilaterally. GI: Soft, nontender, nondistended, BS + x 4. MS: no deformity Holder atrophy. Skin: warm and dry, no rash. Neuro:  Strength and sensation are intact. Psych: Normal affect.  Accessory Clinical Findings    ECG personally reviewed by me today -    - no Susan changes.   Lab Results  Component  Value Date   WBC 13.9 (H) 03/11/2023   HGB 10.2 (L) 03/11/2023   HCT 32.9 (L) 03/11/2023   MCV 95.6 03/11/2023   PLT 299 03/11/2023   Lab Results  Component Value Date   CREATININE 1.09 04/08/2023   BUN 11 04/08/2023   NA 139 04/08/2023   K 3.6 04/08/2023   CL 98 04/08/2023   CO2 32 04/08/2023   Lab Results  Component Value Date   ALT 20 02/11/2023   AST 22 02/11/2023   ALKPHOS 95 02/11/2023   BILITOT 0.4 02/11/2023   No results found for: CHOL, HDL, LDLCALC, LDLDIRECT, TRIG, CHOLHDL  No results found for: HGBA1C  Assessment & Plan    1.  ***  No BP recorded.  {Refresh Note Holder Click here to enter BP  :1}***   Damien JAYSON Braver, NP 05/04/2023, 11:24 AM

## 2023-05-04 NOTE — Telephone Encounter (Signed)
 Copied from CRM [id number]. Topic: Clinical - Home Health Verbal Orders >> May 01, 2023  2:35 PM Leila C wrote: Caller/Agency: Aldona OT from [facility] [phone], patient has been cancelling consistently appointments due to being sick or declined. Patient has been declining OT and PT today. Aldona is requesting approval another visit to finish up the assessment.

## 2023-05-05 ENCOUNTER — Telehealth: Payer: Self-pay

## 2023-05-05 ENCOUNTER — Ambulatory Visit: Payer: Medicare PPO | Attending: Nurse Practitioner | Admitting: Nurse Practitioner

## 2023-05-05 ENCOUNTER — Other Ambulatory Visit: Payer: Self-pay | Admitting: *Deleted

## 2023-05-05 DIAGNOSIS — R000 Tachycardia, unspecified: Secondary | ICD-10-CM

## 2023-05-05 DIAGNOSIS — I3139 Other pericardial effusion (noninflammatory): Secondary | ICD-10-CM

## 2023-05-05 DIAGNOSIS — J441 Chronic obstructive pulmonary disease with (acute) exacerbation: Secondary | ICD-10-CM

## 2023-05-05 DIAGNOSIS — R600 Localized edema: Secondary | ICD-10-CM

## 2023-05-05 DIAGNOSIS — I429 Cardiomyopathy, unspecified: Secondary | ICD-10-CM

## 2023-05-05 DIAGNOSIS — C3491 Malignant neoplasm of unspecified part of right bronchus or lung: Secondary | ICD-10-CM

## 2023-05-05 DIAGNOSIS — R0602 Shortness of breath: Secondary | ICD-10-CM

## 2023-05-05 DIAGNOSIS — J449 Chronic obstructive pulmonary disease, unspecified: Secondary | ICD-10-CM

## 2023-05-05 DIAGNOSIS — J910 Malignant pleural effusion: Secondary | ICD-10-CM

## 2023-05-05 MED ORDER — ALBUTEROL SULFATE (2.5 MG/3ML) 0.083% IN NEBU: 2.5000 mg | INHALATION_SOLUTION | Freq: Four times a day (QID) | RESPIRATORY_TRACT | Status: DC | PRN

## 2023-05-05 NOTE — Telephone Encounter (Signed)
 Left Mindi Junker a detailed voice message with annotation below.

## 2023-05-05 NOTE — Telephone Encounter (Signed)
 Echocardiogram results reviewed by pt via mychart.

## 2023-05-06 ENCOUNTER — Telehealth: Payer: Self-pay | Admitting: Adult Health

## 2023-05-06 DIAGNOSIS — J449 Chronic obstructive pulmonary disease, unspecified: Secondary | ICD-10-CM | POA: Diagnosis not present

## 2023-05-06 NOTE — Telephone Encounter (Signed)
 PT states [facility] on [address] does not have the Albuterol solution we called in. Pls. Call PT to advise @ [phone]

## 2023-05-06 NOTE — Telephone Encounter (Signed)
 PT states [facility] on [address] does not have the Albuterol solution we called in.

## 2023-05-07 ENCOUNTER — Other Ambulatory Visit: Payer: Self-pay

## 2023-05-07 MED ORDER — ALBUTEROL SULFATE (2.5 MG/3ML) 0.083% IN NEBU: 2.5000 mg | INHALATION_SOLUTION | Freq: Four times a day (QID) | RESPIRATORY_TRACT | 11 refills | Status: DC | PRN

## 2023-05-07 NOTE — Telephone Encounter (Signed)
 PT states [facility] on [address] does not have the Albuterol solution we called in.

## 2023-05-07 NOTE — Telephone Encounter (Signed)
 Orders placed refill send

## 2023-05-08 ENCOUNTER — Telehealth: Payer: Self-pay | Admitting: Adult Health

## 2023-05-08 MED ORDER — ALBUTEROL SULFATE (2.5 MG/3ML) 0.083% IN NEBU: 2.5000 mg | INHALATION_SOLUTION | Freq: Four times a day (QID) | RESPIRATORY_TRACT | 11 refills | Status: DC | PRN

## 2023-05-08 NOTE — Telephone Encounter (Signed)
 Pt calling back because she has not rcvd her prescription sent yesterday has not been rcvd by the pharmacy per the patient.

## 2023-05-08 NOTE — Telephone Encounter (Signed)
 Disp Refills Start End   albuterol  (PROVENTIL ) (2.5 MG/3ML) 0.083% nebulizer solution 120 mL 11 05/08/2023 --   Sig - Route: Take 3 mLs (2.5 mg total) by nebulization every 6 (six) hours as needed for shortness of breath. - Nebulization   Sent to pharmacy as: albuterol  (PROVENTIL ) (2.5 MG/3ML) 0.083% nebulizer solution   Notes to Pharmacy: Dx J44.9   E-Prescribing Status: Receipt confirmed by pharmacy (05/08/2023 11:49 AM EST)    Spoke with pt and notified I have sent rx for albuterol  sol  See confirmed receipt from pharmacy  Nothing further needed

## 2023-05-12 ENCOUNTER — Ambulatory Visit (HOSPITAL_COMMUNITY)
Admission: RE | Admit: 2023-05-12 | Discharge: 2023-05-12 | Disposition: A | Discharge status: Home / Self Care | Payer: Medicare PPO | Source: Ambulatory Visit | Attending: Internal Medicine | Admitting: Internal Medicine

## 2023-05-12 ENCOUNTER — Inpatient Hospital Stay: Payer: Medicare PPO | Attending: Physician Assistant

## 2023-05-12 DIAGNOSIS — Z9221 Personal history of antineoplastic chemotherapy: Secondary | ICD-10-CM | POA: Insufficient documentation

## 2023-05-12 DIAGNOSIS — J432 Centrilobular emphysema: Secondary | ICD-10-CM | POA: Diagnosis not present

## 2023-05-12 DIAGNOSIS — C3490 Malignant neoplasm of unspecified part of unspecified bronchus or lung: Secondary | ICD-10-CM | POA: Insufficient documentation

## 2023-05-12 DIAGNOSIS — J9809 Other diseases of bronchus, not elsewhere classified: Secondary | ICD-10-CM | POA: Diagnosis not present

## 2023-05-12 DIAGNOSIS — Z923 Personal history of irradiation: Secondary | ICD-10-CM | POA: Insufficient documentation

## 2023-05-12 DIAGNOSIS — J9811 Atelectasis: Secondary | ICD-10-CM | POA: Diagnosis not present

## 2023-05-12 DIAGNOSIS — C7931 Secondary malignant neoplasm of brain: Secondary | ICD-10-CM | POA: Insufficient documentation

## 2023-05-12 DIAGNOSIS — C3431 Malignant neoplasm of lower lobe, right bronchus or lung: Secondary | ICD-10-CM | POA: Diagnosis not present

## 2023-05-12 DIAGNOSIS — J189 Pneumonia, unspecified organism: Secondary | ICD-10-CM | POA: Insufficient documentation

## 2023-05-12 DIAGNOSIS — C770 Secondary and unspecified malignant neoplasm of lymph nodes of head, face and neck: Secondary | ICD-10-CM | POA: Diagnosis not present

## 2023-05-12 LAB — CMP (CANCER CENTER ONLY)
ALT: 11 U/L (ref 0–44)
AST: 22 U/L (ref 15–41)
Albumin: 3.3 g/dL — ABNORMAL LOW (ref 3.5–5.0)
Alkaline Phosphatase: 78 U/L (ref 38–126)
Anion gap: 6 (ref 5–15)
BUN: 11 mg/dL (ref 8–23)
CO2: 34 mmol/L — ABNORMAL HIGH (ref 22–32)
Calcium: 9.7 mg/dL (ref 8.9–10.3)
Chloride: 102 mmol/L (ref 98–111)
Creatinine: 1.05 mg/dL — ABNORMAL HIGH (ref 0.44–1.00)
GFR, Estimated: 58 mL/min — ABNORMAL LOW (ref >60)
Glucose, Bld: 105 mg/dL — ABNORMAL HIGH (ref 70–99)
Potassium: 4 mmol/L (ref 3.5–5.1)
Sodium: 142 mmol/L (ref 135–145)
Total Bilirubin: 0.4 mg/dL (ref 0.0–1.2)
Total Protein: 7 g/dL (ref 6.5–8.1)

## 2023-05-12 LAB — CBC WITH DIFFERENTIAL (CANCER CENTER ONLY)
Abs Immature Granulocytes: 0.03 10*3/uL (ref 0.00–0.07)
Basophils Absolute: 0 10*3/uL (ref 0.0–0.1)
Basophils Relative: 0 %
Eosinophils Absolute: 0.2 10*3/uL (ref 0.0–0.5)
Eosinophils Relative: 2 %
HCT: 28.7 % — ABNORMAL LOW (ref 36.0–46.0)
Hemoglobin: 8.5 g/dL — ABNORMAL LOW (ref 12.0–15.0)
Immature Granulocytes: 0 %
Lymphocytes Relative: 5 %
Lymphs Abs: 0.4 10*3/uL — ABNORMAL LOW (ref 0.7–4.0)
MCH: 29.2 pg (ref 26.0–34.0)
MCHC: 29.6 g/dL — ABNORMAL LOW (ref 30.0–36.0)
MCV: 98.6 fL (ref 80.0–100.0)
Monocytes Absolute: 0.7 10*3/uL (ref 0.1–1.0)
Monocytes Relative: 8 %
Neutro Abs: 7.6 10*3/uL (ref 1.7–7.7)
Neutrophils Relative %: 85 %
Platelet Count: 368 10*3/uL (ref 150–400)
RBC: 2.91 MIL/uL — ABNORMAL LOW (ref 3.87–5.11)
RDW: 15.8 % — ABNORMAL HIGH (ref 11.5–15.5)
WBC Count: 9 10*3/uL (ref 4.0–10.5)
nRBC: 0 % (ref 0.0–0.2)

## 2023-05-12 MED ORDER — IOHEXOL 300 MG/ML  SOLN
100.0000 mL | Freq: Once | INTRAMUSCULAR | Status: AC | PRN
  Administered 2023-05-12: 100 mL via INTRAVENOUS

## 2023-05-13 ENCOUNTER — Ambulatory Visit: Payer: Medicare PPO | Admitting: Physician Assistant

## 2023-05-13 DIAGNOSIS — M5030 Other cervical disc degeneration, unspecified cervical region: Secondary | ICD-10-CM

## 2023-05-13 DIAGNOSIS — M0579 Rheumatoid arthritis with rheumatoid factor of multiple sites without organ or systems involvement: Secondary | ICD-10-CM

## 2023-05-13 DIAGNOSIS — M65331 Trigger finger, right middle finger: Secondary | ICD-10-CM

## 2023-05-13 DIAGNOSIS — R2681 Unsteadiness on feet: Secondary | ICD-10-CM

## 2023-05-13 DIAGNOSIS — Z79899 Other long term (current) drug therapy: Secondary | ICD-10-CM

## 2023-05-13 DIAGNOSIS — Z8639 Personal history of other endocrine, nutritional and metabolic disease: Secondary | ICD-10-CM

## 2023-05-13 DIAGNOSIS — C3491 Malignant neoplasm of unspecified part of right bronchus or lung: Secondary | ICD-10-CM

## 2023-05-14 DIAGNOSIS — C3490 Malignant neoplasm of unspecified part of unspecified bronchus or lung: Secondary | ICD-10-CM | POA: Diagnosis not present

## 2023-05-15 ENCOUNTER — Ambulatory Visit: Payer: Medicare PPO | Admitting: Primary Care

## 2023-05-18 ENCOUNTER — Telehealth: Payer: Self-pay | Admitting: Family Medicine

## 2023-05-18 NOTE — Telephone Encounter (Signed)
Patient dropped off document Home Health Certificate (Order ID [id number]), to be filled out by provider. Patient requested to send it back via Call Patient to pick up within 7-days. Document is located in providers tray at front office.Please advise at Mobile [phone] (mobile)

## 2023-05-18 NOTE — Telephone Encounter (Signed)
I called and left patient a voicemail that form completed and will fax and leave copy at front office for patient to pick up.  CLINICAL USE BELOW THIS LINE (use X to signify action taken)  ___ Form received and placed in providers office for signature. ___ Form completed and faxed to LOA Dept.  _x__ Form completed & LVM to notify patient ready for pick up.  _x__ Charge sheet and copy of form in front office folder for office supervisor.

## 2023-05-20 ENCOUNTER — Inpatient Hospital Stay (HOSPITAL_BASED_OUTPATIENT_CLINIC_OR_DEPARTMENT_OTHER): Payer: Medicare PPO | Admitting: Internal Medicine

## 2023-05-20 ENCOUNTER — Telehealth: Payer: Self-pay

## 2023-05-20 DIAGNOSIS — C3490 Malignant neoplasm of unspecified part of unspecified bronchus or lung: Secondary | ICD-10-CM | POA: Diagnosis not present

## 2023-05-20 MED ORDER — DOXYCYCLINE HYCLATE 100 MG PO TABS: 100.0000 mg | ORAL_TABLET | Freq: Two times a day (BID) | ORAL | 0 refills | Status: DC

## 2023-05-20 NOTE — Progress Notes (Signed)
[facility] [phone]   [phone]  PROGRESS NOTE FOR TELEMEDICINE VISITS  [name], MD [address] [place] [address]  I connected withNAME@ on 05/20/23 at  2:00 PM EST by telephone visit and verified that I am speaking with the correct person using two identifiers.   I discussed the limitations, risks, security and privacy concerns of performing an evaluation and management service by telemedicine and the availability of in-person appointments. I also discussed with the patient that there may be a patient responsible charge related to this service. The patient expressed understanding and agreed to proceed.  Other persons participating in the visit and their role in the encounter:  None  Patient's location: Home Provider's location: [facility]  DIAGNOSIS: Metastatic non-small cell lung cancer initially diagnosed as stage IIIA (T3, N1/N2, M0) non-small cell lung cancer, adenocarcinoma presented with large right lower lobe lung mass with extension to the right hilum and subcarinal area diagnosed in July 2019.  She has brain metastasis in October 2020.   Biomarker Findings Tumor Mutational Burden - TMB-Intermediate (6 Muts/Mb) Microsatellite status - MS-Stable Genomic Findings For a complete list of the genes assayed, please refer to the Appendix. NRAS Q61R ARAF amplification STK11 G56W KRAS G13D MYCN amplification MCL1 amplification NKX2-1 amplification - equivocal? TP53 G245V 7 Disease relevant genes with no reportable alterations: EGFR, ALK, BRAF, MET, ERBB2, RET, ROS1    PRIOR THERAPY: 1) Course of concurrent chemoradiation with weekly carboplatin for AUC of 2 and paclitaxel 45 mg/M2.  Status post 7 cycles.  Last dose was giving 01/11/2018. 2) Consolidation treatment with immunotherapy with Imfinzi (Durvalumab) 10 mg/KG every 2 weeks.  First dose February 09, 2018.  Status post 19 cycles. 3) status  post stereotactic body radiotherapy to the enlarging right supraclavicular lymphadenopathy under the care of Dr. Mitzi Hansen. 4) SRS to multiple brain metastasis under the care of Dr. Mitzi Hansen. 5) Systemic chemotherapy with carboplatin for AUC of 5, Alimta 500 mg/M2 and Keytruda 200 mg IV every 3 weeks.  First dose 08/16/2019.  Status post 35 cycles.  Starting from cycle #5 the patient is on maintenance treatment with Alimta and Keytruda every 3 weeks. Alimta was reduced to 400 mg/m2 starting from cycle #21.  Starting from cycle #30 her treatment was changed to single agent Keytruda every 3 weeks.  Alimta was discontinued after Sep 04, 2021 secondary to toxicity.   CURRENT THERAPY: Observation.  INTERVAL HISTORY: Susan Holder 68 y.o. female has a MyChart virtual telephone visit with me today for evaluation and discussion of her scan results and treatment options. Discussed the use of AI scribe software for clinical note transcription with the patient, who gave verbal consent to proceed.  History of Present Illness   Susan Holder, a 68 year old patient with a history of metastatic non-small cell lung cancer, initially presented as stage 3 in July 2019, and later progressed to stage 4 in October 2020 due to brain metastasis. The patient underwent extensive treatment, including chemotherapy, radiation to the brain and chest, and nearly two years of Keytruda therapy. The patient has been under observation for over a year.  In November 2024, the patient experienced severe chest pain, initially suspected to be a heart attack. However, subsequent hospitalization and imaging revealed pulmonary embolisms in both lungs, likely originating from clots in each leg. The patient was hospitalized for a week and then transferred to a rehab facility for two weeks.  Following discharge, the patient developed an  infection around the pleuris tube on the right side, which was treated with antibiotics. The tube was  subsequently removed. During this period, the patient experienced significant coughing, producing phlegm for the first time. The coughing was reminiscent of previous bronchitis experiences, but treatment was primarily focused on managing the cough. Despite improvement in coughing and cessation of phlegm production, the patient reports persistent fatigue and difficulty returning to her previous physical state.  In addition to the aforementioned treatments, the patient has been using a home nebulizer twice daily with albuterol and is also on Breztri for breathing assistance. Despite these interventions, the patient reports difficulty catching her breath after any level of activity, although recovery time has improved recently. A CT scan in January 2025 revealed persistent large layering right pleural effusion, right perihilar consolidation, and new focus of consolidation in the left lower lobe with air bronchogram, suggesting pneumonia. There was no evidence of metastatic adenopathy in the chest or metastatic disease in the abdomen or pelvis.      MEDICAL HISTORY: Past Medical History:  Diagnosis Date   Acid reflux 09/21/2018   Adenocarcinoma of right lung, stage 4 (HCC) 11/17/2017   ADHD (attention deficit hyperactivity disorder)    patient denies this dx as of 03/10/22   Allergic rhinitis 08/22/2013   ANA positive 05/29/2016   Anemia    Aortic atherosclerosis (HCC) 10/23/2020   Brain metastases 03/29/2019   COPD (chronic obstructive pulmonary disease) (HCC)    Coronary artery calcification seen on CT scan 10/23/2020   patient is unaware of this dx as of 03/10/22   DDD (degenerative disc disease), cervical 05/29/2016   patient denies this dx as of 03/10/22   Dyspnea    oxygen 2L via [place]   Encounter for antineoplastic chemotherapy 11/17/2017   Encounter for antineoplastic immunotherapy 02/01/2018   Goals of care, counseling/discussion 11/17/2017   High risk medication use 05/29/2016    04/29/2016: ==> plq 200 am & 100 qhs(adeq response).   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   Other fatigue 05/29/2016   Oxygen dependent    2L via [place]   Pericardial effusion 10/23/2020   Pneumonia    as a child   Primary malignant neoplasm of bronchus of right lower lobe (HCC) 12/21/2017   Rheumatoid arthritis (HCC)    Rheumatoid arthritis involving multiple sites with positive rheumatoid factor (HCC) 05/29/2016   +RF +ANA +CCP    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   Smoker 05/29/2016   quit 2019   Trigger finger, left ring finger 05/29/2016   Trigger finger, right ring finger 05/29/2016   Vitamin D deficiency 05/29/2016    ALLERGIES:  has no known allergies.  MEDICATIONS:  Current Outpatient Medications  Medication Sig Dispense Refill   acetaminophen (TYLENOL) 500 MG tablet Take 1,000 mg by mouth every 6 (six) hours as needed for moderate pain.     albuterol (PROVENTIL) (2.5 MG/3ML) 0.083% nebulizer solution Take 3 mLs (2.5 mg total) by nebulization every 6 (six) hours as needed for shortness of breath. 120 mL 11   amoxicillin-clavulanate (AUGMENTIN) 875-125 MG tablet Take 1 tablet by mouth 2 (two) times daily. 14 tablet 0   apixaban (ELIQUIS) 5 MG TABS tablet Take 1 tablet (  5 mg total) by mouth 2 (two) times daily. 180 tablet 3   ascorbic acid (VITAMIN C) 500 MG tablet Take 500 mg by mouth daily.     bacitracin 500 UNIT/GM ointment Apply 1 Application topically 2 (two) times daily. (Patient not taking: Reported on 04/23/2023) 15 g 0   benzonatate (TESSALON) 200 MG capsule Take 1 capsule (200 mg total) by mouth 3 (three) times daily as needed for cough. 30 capsule 1   Budeson-Glycopyrrol-Formoterol (BREZTRI AEROSPHERE) 160-9-4.8 MCG/ACT AERO Inhale 2  puffs into the lungs in the morning and at bedtime.     Cholecalciferol (VITAMIN D-3) 125 MCG (5000 UT) TABS Take 5,000 Units by mouth daily.     fluticasone (FLONASE) 50 MCG/ACT nasal spray Place 1 spray into both nostrils daily. 18.2 mL 2   furosemide (LASIX) 20 MG tablet Take 1 tablet (20 mg total) by mouth daily. 90 tablet 3   HYDROcodone bit-homatropine (HYDROMET) 5-1.5 MG/5ML syrup Take 5 mLs by mouth every 8 (eight) hours as needed for cough. 120 mL 0   hydroxychloroquine (PLAQUENIL) 200 MG tablet Take 1 tablet (200 mg total) by mouth daily. 90 tablet 0   omeprazole (PRILOSEC) 20 MG capsule Take 20 mg by mouth daily.     potassium chloride (KLOR-CON) 10 MEQ tablet Take 1 tablet (10 mEq total) by mouth daily. 90 tablet 3   Tiotropium Bromide-Olodaterol (STIOLTO RESPIMAT) 2.5-2.5 MCG/ACT AERS Inhale 2 puffs into the lungs daily. 4 g 5   No current facility-administered medications for this visit.    SURGICAL HISTORY:  Past Surgical History:  Procedure Laterality Date   BRONCHIAL BIOPSY  03/11/2022   Procedure: BRONCHIAL BIOPSIES;  Surgeon: Omar Person, MD;  Location[facility] ENDOSCOPY;  Service: Pulmonary;;   BRONCHIAL BIOPSY  11/07/2022   Procedure: BRONCHIAL BIOPSIES;  Surgeon: Omar Person, MD;  Location: [facility] ENDOSCOPY;  Service: Pulmonary;;   BRONCHIAL BRUSHINGS  11/07/2022   Procedure: BRONCHIAL BRUSHINGS;  Surgeon: Omar Person, MD;  Location[facility] ENDOSCOPY;  Service: Pulmonary;;   BRONCHIAL NEEDLE ASPIRATION BIOPSY  11/09/2017   Procedure: BRONCHIAL NEEDLE ASPIRATION BIOPSIES;  Surgeon: Chilton Greathouse, MD;  Location: [facility] ENDOSCOPY;  Service: Cardiopulmonary;;   BRONCHIAL NEEDLE ASPIRATION BIOPSY  11/07/2022   Procedure: BRONCHIAL NEEDLE ASPIRATION BIOPSIES;  Surgeon: Omar Person, MD;  Location: [facility] ENDOSCOPY;  Service: Pulmonary;;   BRONCHIAL WASHINGS  03/11/2022   Procedure: BRONCHIAL WASHINGS;  Surgeon: Omar Person, MD;  Location: [facility] ENDOSCOPY;  Service:  Pulmonary;;   CHEST TUBE INSERTION Left 05/13/2021   Procedure: INSERTION PLEURAL DRAINAGE CATHETER;  Surgeon: Corliss Skains, MD;  Location: [facility] OR;  Service: Thoracic;  Laterality: Left;   CHEST TUBE INSERTION Right 03/11/2022   Procedure: INSERTION PLEURAL DRAINAGE CATHETER;  Surgeon: Omar Person, MD;  Location: [facility] ENDOSCOPY;  Service: Pulmonary;  Laterality: Right;  indwelling pleural catheter, w/ cuff   ENDOBRONCHIAL ULTRASOUND Bilateral 11/09/2017   Procedure: ENDOBRONCHIAL ULTRASOUND;  Surgeon: Chilton Greathouse, MD;  Location: [facility] ENDOSCOPY;  Service: Cardiopulmonary;  Laterality: Bilateral;   FIDUCIAL MARKER PLACEMENT  11/07/2022   Procedure: FIDUCIAL MARKER PLACEMENT;  Surgeon: Omar Person, MD;  Location: [facility] ENDOSCOPY;  Service: Pulmonary;;   NASAL SINUS SURGERY     NASAL SINUS SURGERY     REMOVAL OF PLEURAL DRAINAGE CATHETER N/A 04/16/2023   Procedure: MINOR REMOVAL OF PLEURAL DRAINAGE CATHETER;  Surgeon: Steffanie Dunn, DO;  Location: [facility] ENDOSCOPY;  Service: Cardiopulmonary;  Laterality: N/A;   THORACENTESIS Right 02/07/2021  Procedure: THORACENTESIS;  Surgeon: Corliss Skains, MD;  Location: [facility] OR;  Service: Thoracic;  Laterality: Right;   TUBAL LIGATION     VIDEO BRONCHOSCOPY  11/09/2017   Procedure: VIDEO BRONCHOSCOPY;  Surgeon: Chilton Greathouse, MD;  Location: [facility] ENDOSCOPY;  Service: Cardiopulmonary;;   VIDEO BRONCHOSCOPY Left 03/11/2022   Procedure: VIDEO BRONCHOSCOPY WITH FLUORO;  Surgeon: Omar Person, MD;  Location: [facility] ENDOSCOPY;  Service: Pulmonary;  Laterality: Left;   VIDEO BRONCHOSCOPY WITH RADIAL ENDOBRONCHIAL ULTRASOUND  11/07/2022   Procedure: VIDEO BRONCHOSCOPY WITH RADIAL ENDOBRONCHIAL ULTRASOUND;  Surgeon: Omar Person, MD;  Location: [facility] ENDOSCOPY;  Service: Pulmonary;;   XI ROBOTIC ASSISTED PERICARDIAL WINDOW Left 02/07/2021   Procedure: XI ROBOTIC ASSISTED THORACOSCOPY PERICARDIAL WINDOW;  Surgeon: Corliss Skains, MD;  Location:  [facility] OR;  Service: Thoracic;  Laterality: Left;    REVIEW OF SYSTEMS:  Constitutional: positive for fatigue Eyes: negative Ears, nose, mouth, throat, and face: negative Respiratory: positive for cough, dyspnea on exertion, and sputum Cardiovascular: negative Gastrointestinal: negative Genitourinary:negative Integument/breast: negative Hematologic/lymphatic: negative Musculoskeletal:negative Neurological: negative Behavioral/Psych: negative Endocrine: negative Allergic/Immunologic: negative    LABORATORY DATA: Lab Results  Component Value Date   WBC 9.0 05/12/2023   HGB 8.5 (L) 05/12/2023   HCT 28.7 (L) 05/12/2023   MCV 98.6 05/12/2023   PLT 368 05/12/2023      Chemistry      Component Value Date/Time   NA 142 05/12/2023 0930   K 4.0 05/12/2023 0930   CL 102 05/12/2023 0930   CO2 34 (H) 05/12/2023 0930   BUN 11 05/12/2023 0930   CREATININE 1.05 (H) 05/12/2023 0930   CREATININE 0.80 07/24/2017 1438      Component Value Date/Time   CALCIUM 9.7 05/12/2023 0930   ALKPHOS 78 05/12/2023 0930   AST 22 05/12/2023 0930   ALT 11 05/12/2023 0930   BILITOT 0.4 05/12/2023 0930       RADIOGRAPHIC STUDIES: CT Chest W Contrast Result Date: 05/19/2023 CLINICAL DATA:  Non-small cell lung cancer. * Tracking Code: BO * EXAM: CT CHEST, ABDOMEN, AND PELVIS WITH CONTRAST TECHNIQUE: Multidetector CT imaging of the chest, abdomen and pelvis was performed following the standard protocol during bolus administration of intravenous contrast. RADIATION DOSE REDUCTION: This exam was performed according to the departmental dose-optimization program which includes automated exposure control, adjustment of the mA and/or kV according to patient size and/or use of iterative reconstruction technique. CONTRAST:  OMNIPAQUE IOHEXOL 300 MG/ML  SOLN COMPARISON:  03/09/2023 FINDINGS: CT CHEST FINDINGS Cardiovascular: No significant vascular findings. Normal heart size. No pericardial effusion.  Mediastinum/Nodes: No mediastinal lymphadenopathy. Esophagus normal. Narrowing of the distal bronchus intermedius Lungs/Pleura: Persistent large layering RIGHT pleural effusion. Atelectasis in the RIGHT lower lobe. Perihilar consolidation air bronchograms again noted. No significant oval change. Focus consolidation in the anterior aspect of LEFT lower lobe and posterior aspect LEFT upper lobe along the fissure (image 48/series 508. There bronchograms this lesion consolidation suggest pneumonia. There is centrilobular emphysema in LEFT and RIGHT upper lobe. Musculoskeletal: No aggressive osseous lesion. CT ABDOMEN AND PELVIS FINDINGS Hepatobiliary: No focal hepatic lesion. No biliary ductal dilatation. Gallbladder is normal. Common bile duct is normal. Pancreas: Pancreas is normal. No ductal dilatation. No pancreatic inflammation. Spleen: Normal spleen Adrenals/urinary tract: Adrenal glands and kidneys are normal. The ureters and bladder normal. Stomach/Bowel: Stomach, small bowel, appendix, and cecum are normal. The colon and rectosigmoid colon are normal. Vascular/Lymphatic: Abdominal aorta is normal caliber with atherosclerotic calcification. There is no retroperitoneal or  periportal lymphadenopathy. No pelvic lymphadenopathy. Reproductive: Uterus and adnexa unremarkable. Other: No free fluid. Musculoskeletal: No aggressive osseous lesion. IMPRESSION: CHEST: 1. Persistent large layering RIGHT pleural effusion. 2. Persistent RIGHT perihilar consolidation with air bronchograms. 3. New focus of consolidation in the LEFT lower lobe with air bronchograms suggest pneumonia. 4. No evidence of metastatic adenopathy in the chest. PELVIS: No evidence of metastatic disease in the abdomen pelvis. Aortic Atherosclerosis (ICD10-I70.0) and Emphysema (ICD10-J43.9). These results will be called to the ordering clinician or representative by the Radiologist Assistant, and communication documented in the PACS or [employer].  Electronically Signed   By: Genevive Bi M.D.   On: 05/19/2023 14:43   CT ABDOMEN PELVIS W CONTRAST Result Date: 05/19/2023 CLINICAL DATA:  Non-small cell lung cancer. * Tracking Code: BO * EXAM: CT CHEST, ABDOMEN, AND PELVIS WITH CONTRAST TECHNIQUE: Multidetector CT imaging of the chest, abdomen and pelvis was performed following the standard protocol during bolus administration of intravenous contrast. RADIATION DOSE REDUCTION: This exam was performed according to the departmental dose-optimization program which includes automated exposure control, adjustment of the mA and/or kV according to patient size and/or use of iterative reconstruction technique. CONTRAST:  OMNIPAQUE IOHEXOL 300 MG/ML  SOLN COMPARISON:  03/09/2023 FINDINGS: CT CHEST FINDINGS Cardiovascular: No significant vascular findings. Normal heart size. No pericardial effusion. Mediastinum/Nodes: No mediastinal lymphadenopathy. Esophagus normal. Narrowing of the distal bronchus intermedius Lungs/Pleura: Persistent large layering RIGHT pleural effusion. Atelectasis in the RIGHT lower lobe. Perihilar consolidation air bronchograms again noted. No significant oval change. Focus consolidation in the anterior aspect of LEFT lower lobe and posterior aspect LEFT upper lobe along the fissure (image 48/series 508. There bronchograms this lesion consolidation suggest pneumonia. There is centrilobular emphysema in LEFT and RIGHT upper lobe. Musculoskeletal: No aggressive osseous lesion. CT ABDOMEN AND PELVIS FINDINGS Hepatobiliary: No focal hepatic lesion. No biliary ductal dilatation. Gallbladder is normal. Common bile duct is normal. Pancreas: Pancreas is normal. No ductal dilatation. No pancreatic inflammation. Spleen: Normal spleen Adrenals/urinary tract: Adrenal glands and kidneys are normal. The ureters and bladder normal. Stomach/Bowel: Stomach, small bowel, appendix, and cecum are normal. The colon and rectosigmoid colon are normal.  Vascular/Lymphatic: Abdominal aorta is normal caliber with atherosclerotic calcification. There is no retroperitoneal or periportal lymphadenopathy. No pelvic lymphadenopathy. Reproductive: Uterus and adnexa unremarkable. Other: No free fluid. Musculoskeletal: No aggressive osseous lesion. IMPRESSION: CHEST: 1. Persistent large layering RIGHT pleural effusion. 2. Persistent RIGHT perihilar consolidation with air bronchograms. 3. New focus of consolidation in the LEFT lower lobe with air bronchograms suggest pneumonia. 4. No evidence of metastatic adenopathy in the chest. PELVIS: No evidence of metastatic disease in the abdomen pelvis. Aortic Atherosclerosis (ICD10-I70.0) and Emphysema (ICD10-J43.9). These results will be called to the ordering clinician or representative by the Radiologist Assistant, and communication documented in the PACS or [employer]. Electronically Signed   By: Genevive Bi M.D.   On: 05/19/2023 14:43   ECHOCARDIOGRAM COMPLETE Result Date: 04/30/2023    ECHOCARDIOGRAM REPORT   Patient Name:   KIDADA JENNIGES Date of Exam: 04/30/2023 Medical Rec #:  [id number]         Height:       60.0 in Accession #:    [id number]        Weight:       157.6 lb Date of Birth:  28-Nov-1955          BSA:          1.687 m Patient Age:  67 years          BP:           106/56 mmHg Patient Gender: F                 HR:           103 bpm. Exam Location:  [address] Procedure: 2D Echo, Cardiac Doppler and Color Doppler Indications:    I25.0 CAD                 VT I47.2                 Cardiomyopathy I42.9                 SOB R06.02  History:        Patient has prior history of Echocardiogram examinations, most                 recent 03/09/2023. Pericardial effusion s/p pericardial window                 and Cardiomyopathy, CAD, Lung cancer with                 metastases/chemotherapy, pulmonary embolus and COPD,                 Signs/Symptoms:Dyspnea, Shortness of Breath and Edema; Risk                  Factors:Former Smoker. Previous echo revealed LVEF 50% D-shaped                 septum PAP 41.9 mmHg.  Sonographer:    Chanetta Marshall BA, RDCS Referring Phys: [phone] Petra Kuba [facility]  Sonographer Comments: Suboptimal apical window. Patient unble to lay flat. Exam done with patient sitting upright. Patient on 4 liters of O2 and still short of breath with cough. IMPRESSIONS  1. Left ventricular ejection fraction, by estimation, is 55 to 60%. The left ventricle has normal function. The left ventricle has no regional wall motion abnormalities. Left ventricular diastolic parameters were normal. There is a diastolic septal bounce.  2. The RV is not well visualized; however, systolic function appears normal. The RV appears mildly enlarged. The right ventricular size is mildly enlarged. There is mildly elevated pulmonary artery systolic pressure. The estimated right ventricular systolic pressure is 38.3 mmHg.  3. The mitral valve is normal in structure. Trivial mitral valve regurgitation.  4. Tricuspid valve regurgitation is moderate.  5. The aortic valve is abnormal. There is mild calcification of the aortic valve. Aortic valve regurgitation is mild to moderate. FINDINGS  Left Ventricle: Left ventricular ejection fraction, by estimation, is 55 to 60%. The left ventricle has normal function. The left ventricle has no regional wall motion abnormalities. The left ventricular internal cavity size was normal in size. There is  no left ventricular hypertrophy. Left ventricular diastolic parameters were normal. Right Ventricle: The right ventricular size is mildly enlarged. No increase in right ventricular wall thickness. Right ventricular systolic function is normal. There is mildly elevated pulmonary artery systolic pressure. The tricuspid regurgitant velocity is 2.97 m/s, and with an assumed right atrial pressure of 3 mmHg, the estimated right ventricular systolic pressure is 38.3 mmHg. Left Atrium: Left atrial size was normal in  size. Right Atrium: Right atrial size was normal in size. Pericardium: There is no evidence of pericardial effusion. Mitral Valve: The mitral valve is normal in structure. Trivial mitral valve regurgitation. Tricuspid Valve: The  tricuspid valve is normal in structure. Tricuspid valve regurgitation is moderate. Aortic Valve: The aortic valve is abnormal. There is mild calcification of the aortic valve. Aortic valve regurgitation is mild to moderate. Pulmonic Valve: The pulmonic valve was normal in structure. Pulmonic valve regurgitation is not visualized. Aorta: The aortic root is normal in size and structure. IAS/Shunts: No atrial level shunt detected by color flow Doppler.  LEFT VENTRICLE PLAX 2D LVIDd:         4.00 cm   Diastology LVIDs:         2.50 cm   LV e' medial:    7.83 cm/s LV PW:         0.90 cm   LV E/e' medial:  11.2 LV IVS:        0.90 cm   LV e' lateral:   11.40 cm/s LVOT diam:     2.00 cm   LV E/e' lateral: 7.7 LV SV:         61 LV SV Index:   36 LVOT Area:     3.14 cm  RIGHT VENTRICLE            IVC RVSP:           48.3 mmHg  IVC diam: 1.80 cm LEFT ATRIUM           Index        RIGHT ATRIUM            Index LA diam:      2.40 cm 1.42 cm/m   RA Pressure: 13.00 mmHg LA Vol (A2C): 17.3 ml 10.26 ml/m  RA Area:     11.40 cm LA Vol (A4C): 15.6 ml 9.25 ml/m   RA Volume:   25.30 ml   15.00 ml/m  AORTIC VALVE LVOT Vmax:   111.00 cm/s LVOT Vmean:  72.200 cm/s LVOT VTI:    0.194 m  AORTA Ao Root diam: 2.70 cm Ao Asc diam:  3.30 cm MITRAL VALVE               TRICUSPID VALVE MV Area (PHT): 7.74 cm    TR Peak grad:   35.3 mmHg MV Decel Time: 98 msec     TR Vmax:        297.00 cm/s MV E velocity: 88.00 cm/s  Estimated RAP:  13.00 mmHg MV A velocity: 86.20 cm/s  RVSP:           48.3 mmHg MV E/A ratio:  1.02                            SHUNTS                            Systemic VTI:  0.19 m                            Systemic Diam: 2.00 cm Aditya Sabharwal Electronically signed by Dorthula Nettles Signature  Date/Time: 04/30/2023/6:19:16 PM    Final    DG Chest 2 View Result Date: 04/23/2023 CLINICAL DATA:  Pleural effusion EXAM: CHEST - 2 VIEW COMPARISON:  04/14/2023 FINDINGS: Interval removal of the right chest tube. Slightly increased large right partially loculated pleural effusion. Increased airspace opacities in the right lung. Persistent bandlike opacity in the left mid lung with a fiduciary marker. Similar blunting of the left costophrenic angle. No  pneumothorax. IMPRESSION: 1. Interval removal of the right PleurX catheter. 2. Slightly increased right pleural effusion and associated airspace opacities in the right lung compared to 04/14/2023. Electronically Signed   By: Minerva Fester M.D.   On: 04/23/2023 11:09    ASSESSMENT AND PLAN:  This is a very pleasant 68 years old African-American female with metastatic non-small cell lung cancer initially diagnosed with a stage IIIA non-small cell lung cancer, adenocarcinoma.  She underwent a course of concurrent chemoradiation with weekly carboplatin and paclitaxel status post 7 cycles with partial response.   The patient tolerated this course of treatment well except for mild odynophagia and dysphagia. She completed on consolidation treatment with immunotherapy with Imfinzi (Durvalumab) status post 18 cycles. She also completed SBRT to the right supraclavicular lymphadenopathy. The patient had evidence for multiple brain metastasis in October 2020 and she underwent SRS treatment to this lesion under the care of Dr. Mitzi Hansen. The patient had evidence for disease progression and she started systemic chemotherapy with carboplatin, Alimta and Keytruda status post 35 cycles.  Starting from cycle #5 she is on maintenance treatment with Alimta and Keytruda every 3 weeks.  Starting from cycle #30 the patient is on treatment with single agent Keytruda 200 Mg IV every 3 weeks.  Alimta was discontinued secondary to intolerance. The patient has been on observation since  May 2023 with no concerning new complaints. She recent underwent radiotherapy with IMRT to left upper lobe enlarging nodule under the care of Dr. Mitzi Hansen completed on 01/05/2023. The patient is currently on observation.  She had repeat CT scan of the chest, abdomen and pelvis performed recently.  I personally and independently reviewed the scan images and discussed the result with the patient today.  Her scan showed persistent layering large right pleural effusion in addition to suspicious pneumonia of the left lower lobe.  Pneumonia Suspected left lower lobe pneumonia with air bronchogram on recent CT scan. Reports persistent cough, fatigue, and dyspnea. No fever or chills. Given metastatic non-small cell lung cancer and compromised lung function, outpatient antibiotic treatment considered. Discussed risks of outpatient treatment, including potential for worsening symptoms and hospitalization. Explained benefits of avoiding hospital stay if symptoms improve with antibiotics. Patient prefers outpatient treatment. - Prescribe doxycycline 100 mg BID for 14 days - Advise immediate initiation of doxycycline - Instruct to seek hospital care if symptoms worsen or fever/chills develop - Schedule follow-up in three months with repeat scan  Metastatic Non-Small Cell Lung Cancer Stage III diagnosed July 2019, progressed to stage IV with brain metastasis October 2020. Completed chemotherapy, radiation, and nearly two years of [place]. Currently under observation with no evidence of metastatic progression on recent CT scan. - Continue observation - Schedule follow-up in three months with repeat scan  Pleural Effusion Persistent large layering right pleural effusion on recent CT scan. Previous infection around pleural tube site treated with antibiotics and tube removal. - Monitor for symptoms of infection or worsening effusion - Advise to seek medical attention if symptoms worsen  Pulmonary Embolism Bilateral  pulmonary embolism diagnosed November 2024, treated with Eliquis. Continues on Eliquis. Recent CT scan shows persistent large layering right pleural effusion and right perihilar consolidation. - Continue Eliquis as prescribed  General Health Maintenance Discussed in context of current medical conditions. Uses home nebulizer with albuterol and Breztri for breathing support. - Monitor oxygen levels and seek medical attention if levels drop significantly - Encourage use of home nebulizer with albuterol as prescribed - Continue Breztri for breathing support  Follow-up - Schedule follow-up appointment in three months with repeat scan - Advise to go to [facility] if symptoms worsen or fever/chills develop.   She was advised to call immediately if she has any other concerning symptoms in the interval. I discussed the assessment and treatment plan with the patient. The patient was provided an opportunity to ask questions and all were answered. The patient agreed with the plan and demonstrated an understanding of the instructions.   The patient was advised to call back or seek an in-person evaluation if the symptoms worsen or if the condition fails to improve as anticipated.  I provided 35 minutes of non face-to-face telephone visit time during this encounter, and > 50% was spent counseling as documented under my assessment & plan.  Lajuana Matte, MD 05/20/2023 1:48 PM  Disclaimer: This note was dictated with voice recognition software. Similar sounding words can inadvertently be transcribed and may not be corrected upon review.

## 2023-05-20 NOTE — Telephone Encounter (Signed)
Spoke with patient this morning about appt with Dr. Arbutus Ped.  Patient stated that she needed to reschedule her appt because it was too cold outside.  Offered to keep her appt but a telephone visit and Dr. Arbutus Ped said that was fine.   Patient stated that she has been SOB with exertion and coughing for the last 2 months.. Patient stated she went to rehab in November after PE for 2 weeks, infection occurred from the pleurx tube, proceeded with antibiotics, then removed pleurx and ever since the pleurx has been removed, she has been coughing.  Patient stated the cough is better but still there. Patient stated that she went to bathroom and O2 went down to 73 with exertion while on 3L and once she got settled from the bathroom, O2 is now 96.  Patient states that she has been SOB with exertion for the last 2 months and that is normal for her O2 to go down and then back up. Per Dr. Arbutus Ped- continue monitoring O2 for the next couple of hours and if O2 decreases, go to ER.  If persistent, proceed with telephone visit today at 2. Informed patient to check her O2 levels frequently. I will call and check in on her before appt at 2.

## 2023-05-27 NOTE — Progress Notes (Signed)
Office Visit    Patient Name: KETURAH YERBY Date of Encounter: 05/28/2023  Primary Care Provider:  [name], MD Primary Cardiologist:  Garwin Brothers, MD  Chief Complaint    68 year old female with a history of coronary artery calcification noted on CT, cardiomyopathy, pericardial effusion, non-small cell lung cancer, malignant pleural effusion, PE, COPD on home O2, anemia, and rheumatoid arthritis who presents for hospital follow-up related to PE.    Past Medical History    Past Medical History:  Diagnosis Date   Acid reflux 09/21/2018   Adenocarcinoma of right lung, stage 4 (HCC) 11/17/2017   ADHD (attention deficit hyperactivity disorder)    patient denies this dx as of 03/10/22   Allergic rhinitis 08/22/2013   ANA positive 05/29/2016   Anemia    Aortic atherosclerosis (HCC) 10/23/2020   Brain metastases 03/29/2019   COPD (chronic obstructive pulmonary disease) (HCC)    Coronary artery calcification seen on CT scan 10/23/2020   patient is unaware of this dx as of 03/10/22   DDD (degenerative disc disease), cervical 05/29/2016   patient denies this dx as of 03/10/22   Dyspnea    oxygen 2L via [place]   Encounter for antineoplastic chemotherapy 11/17/2017   Encounter for antineoplastic immunotherapy 02/01/2018   Goals of care, counseling/discussion 11/17/2017   High risk medication use 05/29/2016   04/29/2016: ==> plq 200 am & 100 qhs(adeq response).   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   Other fatigue 05/29/2016   Oxygen dependent    2L via [place]   Pericardial effusion 10/23/2020   Pneumonia    as a child   Primary malignant neoplasm of bronchus of right lower lobe (HCC) 12/21/2017   Rheumatoid arthritis (HCC)    Rheumatoid arthritis  involving multiple sites with positive rheumatoid factor (HCC) 05/29/2016   +RF +ANA +CCP    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   Smoker 05/29/2016   quit 2019   Trigger finger, left ring finger 05/29/2016   Trigger finger, right ring finger 05/29/2016   Vitamin D deficiency 05/29/2016   Past Surgical History:  Procedure Laterality Date   BRONCHIAL BIOPSY  03/11/2022   Procedure: BRONCHIAL BIOPSIES;  Surgeon: Omar Person, MD;  Location: [facility] ENDOSCOPY;  Service: Pulmonary;;   BRONCHIAL BIOPSY  11/07/2022   Procedure: BRONCHIAL BIOPSIES;  Surgeon: Omar Person, MD;  Location: [facility] ENDOSCOPY;  Service: Pulmonary;;   BRONCHIAL BRUSHINGS  11/07/2022   Procedure: BRONCHIAL BRUSHINGS;  Surgeon: Omar Person, MD;  Location: [facility] ENDOSCOPY;  Service: Pulmonary;;   BRONCHIAL NEEDLE ASPIRATION BIOPSY  11/09/2017   Procedure: BRONCHIAL NEEDLE ASPIRATION BIOPSIES;  Surgeon: Chilton Greathouse, MD;  Location: [facility] ENDOSCOPY;  Service: Cardiopulmonary;;   BRONCHIAL NEEDLE ASPIRATION BIOPSY  11/07/2022   Procedure: BRONCHIAL NEEDLE ASPIRATION BIOPSIES;  Surgeon: Omar Person, MD;  Location: [facility] ENDOSCOPY;  Service: Pulmonary;;   BRONCHIAL WASHINGS  03/11/2022   Procedure: BRONCHIAL WASHINGS;  Surgeon: Omar Person, MD;  Location: [facility] ENDOSCOPY;  Service: Pulmonary;;   CHEST TUBE INSERTION Left 05/13/2021   Procedure: INSERTION PLEURAL DRAINAGE CATHETER;  Surgeon: Corliss Skains, MD;  Location: [facility] OR;  Service: Thoracic;  Laterality: Left;   CHEST TUBE INSERTION  Right 03/11/2022   Procedure: INSERTION PLEURAL DRAINAGE CATHETER;  Surgeon: Omar Person, MD;  Location: [facility] ENDOSCOPY;  Service: Pulmonary;  Laterality: Right;  indwelling pleural catheter, w/ cuff   ENDOBRONCHIAL ULTRASOUND Bilateral 11/09/2017   Procedure: ENDOBRONCHIAL ULTRASOUND;  Surgeon: Chilton Greathouse, MD;  Location: [facility] ENDOSCOPY;  Service: Cardiopulmonary;  Laterality: Bilateral;   FIDUCIAL MARKER  PLACEMENT  11/07/2022   Procedure: FIDUCIAL MARKER PLACEMENT;  Surgeon: Omar Person, MD;  Location: [facility] ENDOSCOPY;  Service: Pulmonary;;   NASAL SINUS SURGERY     NASAL SINUS SURGERY     REMOVAL OF PLEURAL DRAINAGE CATHETER N/A 04/16/2023   Procedure: MINOR REMOVAL OF PLEURAL DRAINAGE CATHETER;  Surgeon: Steffanie Dunn, DO;  Location: [facility] ENDOSCOPY;  Service: Cardiopulmonary;  Laterality: N/A;   THORACENTESIS Right 02/07/2021   Procedure: THORACENTESIS;  Surgeon: Corliss Skains, MD;  Location: [facility] OR;  Service: Thoracic;  Laterality: Right;   TUBAL LIGATION     VIDEO BRONCHOSCOPY  11/09/2017   Procedure: VIDEO BRONCHOSCOPY;  Surgeon: Chilton Greathouse, MD;  Location: [facility] ENDOSCOPY;  Service: Cardiopulmonary;;   VIDEO BRONCHOSCOPY Left 03/11/2022   Procedure: VIDEO BRONCHOSCOPY WITH FLUORO;  Surgeon: Omar Person, MD;  Location: [facility] ENDOSCOPY;  Service: Pulmonary;  Laterality: Left;   VIDEO BRONCHOSCOPY WITH RADIAL ENDOBRONCHIAL ULTRASOUND  11/07/2022   Procedure: VIDEO BRONCHOSCOPY WITH RADIAL ENDOBRONCHIAL ULTRASOUND;  Surgeon: Omar Person, MD;  Location: [facility] ENDOSCOPY;  Service: Pulmonary;;   XI ROBOTIC ASSISTED PERICARDIAL WINDOW Left 02/07/2021   Procedure: XI ROBOTIC ASSISTED THORACOSCOPY PERICARDIAL WINDOW;  Surgeon: Corliss Skains, MD;  Location: [facility] OR;  Service: Thoracic;  Laterality: Left;    Allergies  No Known Allergies   Labs/Other Studies Reviewed    The following studies were reviewed today:  Cardiac Studies & Procedures      ECHOCARDIOGRAM  ECHOCARDIOGRAM COMPLETE 04/30/2023  Narrative ECHOCARDIOGRAM REPORT    Patient Name:   MAKAIAH TERWILLIGER Date of Exam: 04/30/2023 Medical Rec #:  [id number]         Height:       60.0 in Accession #:    [id number]        Weight:       157.6 lb Date of Birth:  01-23-1956          BSA:          1.687 m Patient Age:    67 years          BP:           106/56 mmHg Patient Gender: F                 HR:            103 bpm. Exam Location:  [address]  Procedure: 2D Echo, Cardiac Doppler and Color Doppler  Indications:    I25.0 CAD VT I47.2 Cardiomyopathy I42.9 SOB R06.02  History:        Patient has prior history of Echocardiogram examinations, most recent 03/09/2023. Pericardial effusion s/p pericardial window and Cardiomyopathy, CAD, Lung cancer with metastases/chemotherapy, pulmonary embolus and COPD, Signs/Symptoms:Dyspnea, Shortness of Breath and Edema; Risk Factors:Former Smoker. Previous echo revealed LVEF 50% D-shaped septum PAP 41.9 mmHg.  Sonographer:    Chanetta Marshall BA, RDCS Referring Phys: [phone] Petra Kuba [facility]   Sonographer Comments: Suboptimal apical window. Patient unble to lay flat. Exam done with patient sitting upright. Patient on 4 liters of O2 and still short of breath with cough. IMPRESSIONS   1. Left  ventricular ejection fraction, by estimation, is 55 to 60%. The left ventricle has normal function. The left ventricle has no regional wall motion abnormalities. Left ventricular diastolic parameters were normal. There is a diastolic septal bounce. 2. The RV is not well visualized; however, systolic function appears normal. The RV appears mildly enlarged. The right ventricular size is mildly enlarged. There is mildly elevated pulmonary artery systolic pressure. The estimated right ventricular systolic pressure is 38.3 mmHg. 3. The mitral valve is normal in structure. Trivial mitral valve regurgitation. 4. Tricuspid valve regurgitation is moderate. 5. The aortic valve is abnormal. There is mild calcification of the aortic valve. Aortic valve regurgitation is mild to moderate.  FINDINGS Left Ventricle: Left ventricular ejection fraction, by estimation, is 55 to 60%. The left ventricle has normal function. The left ventricle has no regional wall motion abnormalities. The left ventricular internal cavity size was normal in size. There is no left ventricular hypertrophy. Left  ventricular diastolic parameters were normal.  Right Ventricle: The right ventricular size is mildly enlarged. No increase in right ventricular wall thickness. Right ventricular systolic function is normal. There is mildly elevated pulmonary artery systolic pressure. The tricuspid regurgitant velocity is 2.97 m/s, and with an assumed right atrial pressure of 3 mmHg, the estimated right ventricular systolic pressure is 38.3 mmHg.  Left Atrium: Left atrial size was normal in size.  Right Atrium: Right atrial size was normal in size.  Pericardium: There is no evidence of pericardial effusion.  Mitral Valve: The mitral valve is normal in structure. Trivial mitral valve regurgitation.  Tricuspid Valve: The tricuspid valve is normal in structure. Tricuspid valve regurgitation is moderate.  Aortic Valve: The aortic valve is abnormal. There is mild calcification of the aortic valve. Aortic valve regurgitation is mild to moderate.  Pulmonic Valve: The pulmonic valve was normal in structure. Pulmonic valve regurgitation is not visualized.  Aorta: The aortic root is normal in size and structure.  IAS/Shunts: No atrial level shunt detected by color flow Doppler.   LEFT VENTRICLE PLAX 2D LVIDd:         4.00 cm   Diastology LVIDs:         2.50 cm   LV e' medial:    7.83 cm/s LV PW:         0.90 cm   LV E/e' medial:  11.2 LV IVS:        0.90 cm   LV e' lateral:   11.40 cm/s LVOT diam:     2.00 cm   LV E/e' lateral: 7.7 LV SV:         61 LV SV Index:   36 LVOT Area:     3.14 cm   RIGHT VENTRICLE            IVC RVSP:           48.3 mmHg  IVC diam: 1.80 cm  LEFT ATRIUM           Index        RIGHT ATRIUM            Index LA diam:      2.40 cm 1.42 cm/m   RA Pressure: 13.00 mmHg LA Vol (A2C): 17.3 ml 10.26 ml/m  RA Area:     11.40 cm LA Vol (A4C): 15.6 ml 9.25 ml/m   RA Volume:   25.30 ml   15.00 ml/m AORTIC VALVE LVOT Vmax:   111.00 cm/s LVOT Vmean:  72.200 cm/s LVOT VTI:  0.194  m  AORTA Ao Root diam: 2.70 cm Ao Asc diam:  3.30 cm  MITRAL VALVE               TRICUSPID VALVE MV Area (PHT): 7.74 cm    TR Peak grad:   35.3 mmHg MV Decel Time: 98 msec     TR Vmax:        297.00 cm/s MV E velocity: 88.00 cm/s  Estimated RAP:  13.00 mmHg MV A velocity: 86.20 cm/s  RVSP:           48.3 mmHg MV E/A ratio:  1.02 SHUNTS Systemic VTI:  0.19 m Systemic Diam: 2.00 cm  Aditya Sabharwal Electronically signed by Dorthula Nettles Signature Date/Time: 04/30/2023/6:19:16 PM    Final            Recent Labs: 06/17/2022: TSH 3.033 05/12/2023: ALT 11; BUN 11; Creatinine 1.05; Hemoglobin 8.5; Platelet Count 368; Potassium 4.0; Sodium 142  Recent Lipid Panel No results found for: "CHOL", "TRIG", "HDL", "CHOLHDL", "VLDL", "LDLCALC", "LDLDIRECT"  History of Present Illness    68 year old female with the above past medical history including coronary artery calcification noted on CT, cardiomyopathy, pericardial effusion, non-small cell lung cancer, malignant pleural effusion, PE, COPD on home, anemia, and rheumatoid arthritis.   She was referred to cardiology in 09/2020 in the setting of cc pericardial effusion noted on CT scan.  There was also evidence of coronary artery calcification and aortic atherosclerosis on CT.  She was also noted to have sinus tachycardia.  Echocardiogram in 10/2020 revealed EF 40 to 45%, mildly decreased LV function, LV global hypokinesis, G1 DD, moderately reduced RV, moderate circumferential pericardial effusion without evidence of tamponade.  She was referred to CT surgery and underwent robotic assisted pericardial window in 01/2021.  Additionally, she has a history of non-small cell lung cancer with metastases to brain, malignant pleural effusion s/p Pleurx catheter.  She is following with pulmonology and oncology.  Her oncologist on 02/17/2023 and noted elevated heart rate, lower extremity edema.  She was last seen in the office on 02/23/2023 and was stable  overall.  She noted generalized weakness, fatigue, chest heaviness associated with draining of Pleurx catheter.  She noted new urinary incontinence.  She was hospitalized in 03/18/2023 in the setting of PE, bilateral lower extremity DVT.  Repeat echocardiogram showed EF 45 to 50%, mildly decreased LV function, mild concentric LVH, G1 DD, tricuspid valve regurgitation, moderate aortic valve regurgitation. She was discharged home in stable condition on 03/12/2023.  She was hospitalized again in 03/2023 in the setting of Pleurx removal due to infection around pleural tube site.  She saw her oncologist on 05/20/2023 and was treated for pneumonia.  Repeat CT scan was recommended in 3 months.   She presents today for follow-up.  Since her last visit and since her most recent hospitalization she has been stable from a cardiac standpoint. She does not generalized weakness, fatigue.  She endorses a poor appetite. She is on her second week of doxycycline.  She  denies chest pain, palpitations, dizziness,presyncope, syncope, worsening shortness of breath, edema, PND, orthopnea, weight gain. Her biggest concern is her generalized weakness, fatigue.  Home Medications    Current Outpatient Medications  Medication Sig Dispense Refill   acetaminophen (TYLENOL) 500 MG tablet Take 1,000 mg by mouth every 6 (six) hours as needed for moderate pain.     albuterol (PROVENTIL) (2.5 MG/3ML) 0.083% nebulizer solution Take 3 mLs (2.5 mg total) by nebulization every 6 (  six) hours as needed for shortness of breath. 120 mL 11   apixaban (ELIQUIS) 5 MG TABS tablet Take 1 tablet (5 mg total) by mouth 2 (two) times daily. 180 tablet 3   ascorbic acid (VITAMIN C) 500 MG tablet Take 500 mg by mouth daily.     bacitracin 500 UNIT/GM ointment Apply 1 Application topically 2 (two) times daily. 15 g 0   benzonatate (TESSALON) 200 MG capsule Take 1 capsule (200 mg total) by mouth 3 (three) times daily as needed for cough. 30 capsule 1    Cholecalciferol (VITAMIN D-3) 125 MCG (5000 UT) TABS Take 5,000 Units by mouth daily.     doxycycline (VIBRA-TABS) 100 MG tablet Take 1 tablet (100 mg total) by mouth 2 (two) times daily. 30 tablet 0   fluticasone (FLONASE) 50 MCG/ACT nasal spray Place 1 spray into both nostrils daily. 18.2 mL 2   furosemide (LASIX) 20 MG tablet Take 1 tablet (20 mg total) by mouth daily. 90 tablet 3   hydroxychloroquine (PLAQUENIL) 200 MG tablet Take 1 tablet (200 mg total) by mouth daily. 90 tablet 0   omeprazole (PRILOSEC) 20 MG capsule Take 20 mg by mouth daily.     potassium chloride (KLOR-CON) 10 MEQ tablet Take 1 tablet (10 mEq total) by mouth daily. 90 tablet 3   Tiotropium Bromide-Olodaterol (STIOLTO RESPIMAT) 2.5-2.5 MCG/ACT AERS Inhale 2 puffs into the lungs daily. 4 g 5   amoxicillin-clavulanate (AUGMENTIN) 875-125 MG tablet Take 1 tablet by mouth 2 (two) times daily. (Patient not taking: Reported on 05/28/2023) 14 tablet 0   Budeson-Glycopyrrol-Formoterol (BREZTRI AEROSPHERE) 160-9-4.8 MCG/ACT AERO Inhale 2 puffs into the lungs in the morning and at bedtime. (Patient not taking: Reported on 05/28/2023)     HYDROcodone bit-homatropine (HYDROMET) 5-1.5 MG/5ML syrup Take 5 mLs by mouth every 8 (eight) hours as needed for cough. (Patient not taking: Reported on 05/28/2023) 120 mL 0   No current facility-administered medications for this visit.     Review of Systems    She denies chest pain, palpitations, pnd, orthopnea, n, v, dizziness, syncope, weight gain, or early satiety. All other systems reviewed and are otherwise negative except as noted above.   Physical Exam    VS:  BP (!) 106/48 (BP Location: Left Arm, Patient Position: Sitting, Cuff Size: Normal)   Pulse (!) 111   Ht 5' (1.524 m)   LMP 04/28/2000   SpO2 100%   BMI 30.78 kg/m   GEN: Well nourished, well developed, in no acute distress. HEENT: normal. Neck: Supple, no JVD, carotid bruits, or masses. Cardiac: RRR, no murmurs, rubs, or  gallops. No clubbing, cyanosis, nonpitting bilateral ankle edema.  Radials/DP/PT 2+ and equal bilaterally.  Respiratory:  Respirations regular and unlabored, lung sounds diminished bilaterally.  GI: Soft, nontender, nondistended, BS + x 4. MS: no deformity or atrophy. Skin: warm and dry, no rash. Neuro:  Strength and sensation are intact. Psych: Normal affect.  Accessory Clinical Findings    ECG personally reviewed by me today - EKG Interpretation Date/Time:  Thursday May 28 2023 10:32:11 EST Ventricular Rate:  111 PR Interval:  114 QRS Duration:  74 QT Interval:  370 QTC Calculation: 503 R Axis:   69  Text Interpretation: Sinus tachycardia Nonspecific T wave abnormality When compared with ECG of 08-Mar-2023 21:45, Artifact T wave inversion no longer evident in Anterior leads Confirmed by Bernadene Person ([id number]) on 05/28/2023 10:53:55 AM  - no acute changes.   Lab Results  Component Value Date  WBC 9.0 05/12/2023   HGB 8.5 (L) 05/12/2023   HCT 28.7 (L) 05/12/2023   MCV 98.6 05/12/2023   PLT 368 05/12/2023   Lab Results  Component Value Date   CREATININE 1.05 (H) 05/12/2023   BUN 11 05/12/2023   NA 142 05/12/2023   K 4.0 05/12/2023   CL 102 05/12/2023   CO2 34 (H) 05/12/2023   Lab Results  Component Value Date   ALT 11 05/12/2023   AST 22 05/12/2023   ALKPHOS 78 05/12/2023   BILITOT 0.4 05/12/2023   No results found for: "CHOL", "HDL", "LDLCALC", "LDLDIRECT", "TRIG", "CHOLHDL"  No results found for: "HGBA1C"  Assessment & Plan    1. History of pericardial effusion/Tachycardia: S/p pericardial window in 01/2021.  CT of chest on 02/11/2023 with no evidence of pericardial effusion. Most recent echo in 02/2023 showed EF 45 to 50%, mildly decreased LV function, mild concentric LVH, G1 DD, tricuspid valve regurgitation, moderate aortic valve regurgitation. She denies any significant palpitations, she does note dizziness with position changes, generalized fatigue,  generalized weakness.  EKG today shows sinus tachycardia, this has been her baseline.  Overall stable.    2. History of PE/DVT: Diagnosed in 02/2023.  EKG today shows stable sinus tachycardia.  She denies worsening dyspnea, chest pain, denies worsening edema.  Will check CBC, BMET today. Continue Eliquis.  3. Cardiomyopathy/dyspnea on exertion: Most recent echo as above. She has stable dyspnea on exertion, stable nonpitting bilateral lower extremity/ankle edema, generally euvolemic on exam. Denies PND, orthopnea, weight gain. Suspect her symptoms of dyspnea, generalized weakness, fatigue are multifactorial in the setting of recent pneumonia, lung cancer, malignant pleural effusion. Continue Lasix.    4. Coronary artery calcification noted on CT: Stable with no anginal symptoms. No indication for ischemic evaluation.    5. COPD/non-small cell lung cancer/history of malignant pleural effusion/generalized weakness: On home O2. PleruX catheter was removed in 03/2023 due to infection around pleural tube site. Recent pneumonia.  She notes significant generalized weakness, fatigue.  SPECT this has been exacerbated by recent pneumonia.  She may benefit from PT in the future. Following with pulmonology/oncology.    6. Disposition:  Follow-up in 3-4 months. She is interested in switching cardiologist from Dr. Tomie China to Dr. Jacques Navy, I will reach out to both providers to see if they are agreeable to this change.       Joylene Grapes, NP 05/28/2023, 10:54 AM

## 2023-05-28 ENCOUNTER — Encounter: Payer: Self-pay | Admitting: Nurse Practitioner

## 2023-05-28 ENCOUNTER — Ambulatory Visit: Payer: Medicare PPO | Attending: Nurse Practitioner | Admitting: Nurse Practitioner

## 2023-05-28 VITALS — BP 106/48 | HR 111 | Ht 60.0 in

## 2023-05-28 DIAGNOSIS — I3139 Other pericardial effusion (noninflammatory): Secondary | ICD-10-CM

## 2023-05-28 DIAGNOSIS — J910 Malignant pleural effusion: Secondary | ICD-10-CM

## 2023-05-28 DIAGNOSIS — R600 Localized edema: Secondary | ICD-10-CM | POA: Diagnosis not present

## 2023-05-28 DIAGNOSIS — J449 Chronic obstructive pulmonary disease, unspecified: Secondary | ICD-10-CM | POA: Diagnosis not present

## 2023-05-28 DIAGNOSIS — R000 Tachycardia, unspecified: Secondary | ICD-10-CM

## 2023-05-28 DIAGNOSIS — R0602 Shortness of breath: Secondary | ICD-10-CM | POA: Diagnosis not present

## 2023-05-28 DIAGNOSIS — Z86718 Personal history of other venous thrombosis and embolism: Secondary | ICD-10-CM

## 2023-05-28 DIAGNOSIS — I2510 Atherosclerotic heart disease of native coronary artery without angina pectoris: Secondary | ICD-10-CM | POA: Diagnosis not present

## 2023-05-28 DIAGNOSIS — I429 Cardiomyopathy, unspecified: Secondary | ICD-10-CM | POA: Diagnosis not present

## 2023-05-28 DIAGNOSIS — C3491 Malignant neoplasm of unspecified part of right bronchus or lung: Secondary | ICD-10-CM

## 2023-05-28 DIAGNOSIS — Z86711 Personal history of pulmonary embolism: Secondary | ICD-10-CM

## 2023-05-28 DIAGNOSIS — C3490 Malignant neoplasm of unspecified part of unspecified bronchus or lung: Secondary | ICD-10-CM | POA: Diagnosis not present

## 2023-05-28 NOTE — Patient Instructions (Signed)
Medication Instructions:  Your physician recommends that you continue on your current medications as directed. Please refer to the Current Medication list given to you today.  *If you need a refill on your cardiac medications before your next appointment, please call your pharmacy*   Lab Work: BMET, CBC today   Testing/Procedures: NONE ordered at this time of appointment   Follow-Up: At [facility], you and your health needs are our priority.  As part of our continuing mission to provide you with exceptional heart care, we have created designated Provider Care Teams.  These Care Teams include your primary Cardiologist (physician) and Advanced Practice Providers (APPs -  Physician Assistants and Nurse Practitioners) who all work together to provide you with the care you need, when you need it.  We recommend signing up for the patient portal called "MyChart".  Sign up information is provided on this After Visit Summary.  MyChart is used to connect with patients for Virtual Visits (Telemedicine).  Patients are able to view lab/test results, encounter notes, upcoming appointments, etc.  Non-urgent messages can be sent to your provider as well.   To learn more about what you can do with MyChart, go to [email or web address].    Your next appointment:   2-3 month(s)  Provider:   Bernadene Person, NP        Other Instructions

## 2023-05-29 ENCOUNTER — Encounter: Payer: Self-pay | Admitting: Nurse Practitioner

## 2023-05-29 LAB — BASIC METABOLIC PANEL
BUN/Creatinine Ratio: 11 — ABNORMAL LOW (ref 12–28)
BUN: 11 mg/dL (ref 8–27)
CO2: 27 mmol/L (ref 20–29)
Calcium: 10.2 mg/dL (ref 8.7–10.3)
Chloride: 95 mmol/L — ABNORMAL LOW (ref 96–106)
Creatinine, Ser: 1.04 mg/dL — ABNORMAL HIGH (ref 0.57–1.00)
Glucose: 81 mg/dL (ref 70–99)
Potassium: 3.8 mmol/L (ref 3.5–5.2)
Sodium: 142 mmol/L (ref 134–144)
eGFR: 58 mL/min/{1.73_m2} — ABNORMAL LOW (ref >59)

## 2023-06-01 ENCOUNTER — Telehealth: Payer: Self-pay | Admitting: Radiation Therapy

## 2023-06-01 NOTE — Telephone Encounter (Signed)
I spoke with Susan Holder about her upcoming brain MRI in March and telephone follow-up with Laurence Aly, PA-C, to review those results. She has this information written down and plans to attend.   [name] R.T.(R)(T) Radiation Special Procedure Lead

## 2023-06-06 DIAGNOSIS — J449 Chronic obstructive pulmonary disease, unspecified: Secondary | ICD-10-CM | POA: Diagnosis not present

## 2023-06-09 ENCOUNTER — Telehealth: Payer: Self-pay

## 2023-06-09 NOTE — Telephone Encounter (Signed)
Left a detailed message with lab results. Lab results were also sent via mychart.

## 2023-06-17 NOTE — Progress Notes (Deleted)
 Office Visit Note  Patient: BRENTLEY HORRELL             Date of Birth: 25-Sep-1955           MRN: [id number]             PCP: [name], MD Referring: [name], MD Visit Date: 07/01/2023 Occupation: @GUAROCC @  Subjective:  No chief complaint on file.   History of Present Illness: Susan Holder is a 68 y.o. female ***     Activities of Daily Living:  Patient reports morning stiffness for *** {minute/hour:19697}.   Patient {ACTIONS;DENIES/REPORTS:21021675::"Denies"} nocturnal pain.  Difficulty dressing/grooming: {ACTIONS;DENIES/REPORTS:21021675::"Denies"} Difficulty climbing stairs: {ACTIONS;DENIES/REPORTS:21021675::"Denies"} Difficulty getting out of chair: {ACTIONS;DENIES/REPORTS:21021675::"Denies"} Difficulty using hands for taps, buttons, cutlery, and/or writing: {ACTIONS;DENIES/REPORTS:21021675::"Denies"}  No Rheumatology ROS completed.   PMFS History:  Patient Active Problem List   Diagnosis Date Noted   Acute pulmonary embolism without acute cor pulmonale (HCC) 03/09/2023   Centrilobular emphysema (HCC) 11/20/2022   Malignant pleural effusion 11/20/2022   Chronic respiratory failure with hypoxia (HCC) 11/20/2022   Pneumonitis 03/11/2022   Adenocarcinoma of right lung, stage 4 (HCC) 02/20/2022   Unsteady gait when walking 11/19/2021   Dizziness 11/12/2021   S/P Left sided pigtail catheter placement 05/13/2021   Anemia 04/23/2021   Pleural effusion 04/12/2021   Malnutrition of moderate degree 02/09/2021   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   Aortic atherosclerosis (HCC) 10/23/2020   Coronary artery calcification seen on CT scan 10/23/2020   Pericardial effusion 10/23/2020   met lung ca 10/22/2020   Rheumatoid arthritis (HCC) 10/22/2020   Metastasis to brain (HCC) 03/29/2019   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) 11/04/2018   Acid reflux 09/21/2018   Hoarseness 03/16/2018   Encounter for antineoplastic immunotherapy  02/01/2018   Primary malignant neoplasm of bronchus of right lower lobe (HCC) 12/21/2017   Adenocarcinoma of right lung, stage 4 (HCC) 11/17/2017   Encounter for antineoplastic chemotherapy 11/17/2017   Goals of care, counseling/discussion 11/17/2017   Pulmonary nodules    Left hand pain 03/06/2017   Rheumatoid arthritis involving multiple sites with positive rheumatoid factor (HCC) 05/29/2016   ANA positive 05/29/2016   Vitamin D deficiency 05/29/2016   High risk medication use 05/29/2016   Trigger finger, left ring finger 05/29/2016   Trigger finger, right ring finger 05/29/2016   DDD (degenerative disc disease), cervical 05/29/2016   Smoker 05/29/2016   Other fatigue 05/29/2016   Allergic rhinitis 08/22/2013    Past Medical History:  Diagnosis Date   Acid reflux 09/21/2018   Adenocarcinoma of right lung, stage 4 (HCC) 11/17/2017   ADHD (attention deficit hyperactivity disorder)    patient denies this dx as of 03/10/22   Allergic rhinitis 08/22/2013   ANA positive 05/29/2016   Anemia    Aortic atherosclerosis (HCC) 10/23/2020   Brain metastases 03/29/2019   COPD (chronic obstructive pulmonary disease) (HCC)    Coronary artery calcification seen on CT scan 10/23/2020   patient is unaware of this dx as of 03/10/22   DDD (degenerative disc disease), cervical 05/29/2016   patient denies this dx as of 03/10/22   Dyspnea    oxygen 2L via [place]   Encounter for antineoplastic chemotherapy 11/17/2017   Encounter for antineoplastic immunotherapy 02/01/2018   Goals of care, counseling/discussion 11/17/2017   High risk medication use 05/29/2016   04/29/2016: ==> plq 200 am & 100 qhs(adeq response).   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) 11/04/2018   Other fatigue 05/29/2016    Oxygen dependent    2L via [place]   Pericardial effusion 10/23/2020   Pneumonia    as a child   Primary malignant neoplasm of bronchus of right lower lobe (HCC) 12/21/2017   Rheumatoid arthritis (HCC)    Rheumatoid arthritis involving multiple sites with positive rheumatoid factor (HCC) 05/29/2016   +RF +ANA +CCP    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   Smoker 05/29/2016   quit 2019   Trigger finger, left ring finger 05/29/2016   Trigger finger, right ring finger 05/29/2016   Vitamin D deficiency 05/29/2016    Family History  Problem Relation Age of Onset   Stroke Mother    Alzheimer's disease Mother    Heart disease Mother    Emphysema Father    Hypertension Brother    Heart attack Maternal Aunt    Heart failure Maternal Grandmother    Hypertension Paternal Grandmother    Past Surgical History:  Procedure Laterality Date   BRONCHIAL BIOPSY  03/11/2022   Procedure: BRONCHIAL BIOPSIES;  Surgeon: Omar Person, MD;  Location: [facility] ENDOSCOPY;  Service: Pulmonary;;   BRONCHIAL BIOPSY  11/07/2022   Procedure: BRONCHIAL BIOPSIES;  Surgeon: Omar Person, MD;  Location: [facility] ENDOSCOPY;  Service: Pulmonary;;   BRONCHIAL BRUSHINGS  11/07/2022   Procedure: BRONCHIAL BRUSHINGS;  Surgeon: Omar Person, MD;  Location: [facility] ENDOSCOPY;  Service: Pulmonary;;   BRONCHIAL NEEDLE ASPIRATION BIOPSY  11/09/2017   Procedure: BRONCHIAL NEEDLE ASPIRATION BIOPSIES;  Surgeon: Chilton Greathouse, MD;  Location: [facility] ENDOSCOPY;  Service: Cardiopulmonary;;   BRONCHIAL NEEDLE ASPIRATION BIOPSY  11/07/2022   Procedure: BRONCHIAL NEEDLE ASPIRATION BIOPSIES;  Surgeon: Omar Person, MD;  Location: [facility] ENDOSCOPY;  Service: Pulmonary;;   BRONCHIAL WASHINGS  03/11/2022   Procedure: BRONCHIAL WASHINGS;  Surgeon: Omar Person, MD;  Location: [facility] ENDOSCOPY;  Service: Pulmonary;;   CHEST TUBE INSERTION Left 05/13/2021   Procedure: INSERTION PLEURAL DRAINAGE CATHETER;  Surgeon: Corliss Skains,  MD;  Location: [facility] OR;  Service: Thoracic;  Laterality: Left;   CHEST TUBE INSERTION Right 03/11/2022   Procedure: INSERTION PLEURAL DRAINAGE CATHETER;  Surgeon: Omar Person, MD;  Location: [facility] ENDOSCOPY;  Service: Pulmonary;  Laterality: Right;  indwelling pleural catheter, w/ cuff   ENDOBRONCHIAL ULTRASOUND Bilateral 11/09/2017   Procedure: ENDOBRONCHIAL ULTRASOUND;  Surgeon: Chilton Greathouse, MD;  Location: [facility] ENDOSCOPY;  Service: Cardiopulmonary;  Laterality: Bilateral;   FIDUCIAL MARKER PLACEMENT  11/07/2022   Procedure: FIDUCIAL MARKER PLACEMENT;  Surgeon: Omar Person, MD;  Location: [facility] ENDOSCOPY;  Service: Pulmonary;;   NASAL SINUS SURGERY     NASAL SINUS SURGERY     REMOVAL OF PLEURAL DRAINAGE CATHETER N/A 04/16/2023   Procedure: MINOR REMOVAL OF PLEURAL DRAINAGE CATHETER;  Surgeon: Steffanie Dunn, DO;  Location: [facility] ENDOSCOPY;  Service: Cardiopulmonary;  Laterality: N/A;   THORACENTESIS Right 02/07/2021   Procedure: THORACENTESIS;  Surgeon: Corliss Skains, MD;  Location: [facility] OR;  Service: Thoracic;  Laterality: Right;   TUBAL LIGATION     VIDEO BRONCHOSCOPY  11/09/2017   Procedure: VIDEO BRONCHOSCOPY;  Surgeon: Chilton Greathouse, MD;  Location: [facility] ENDOSCOPY;  Service: Cardiopulmonary;;   VIDEO BRONCHOSCOPY Left 03/11/2022   Procedure: VIDEO BRONCHOSCOPY WITH FLUORO;  Surgeon: Omar Person, MD;  Location: [facility] ENDOSCOPY;  Service: Pulmonary;  Laterality: Left;   VIDEO BRONCHOSCOPY WITH RADIAL ENDOBRONCHIAL ULTRASOUND  11/07/2022   Procedure: VIDEO BRONCHOSCOPY WITH RADIAL ENDOBRONCHIAL ULTRASOUND;  Surgeon: Omar Person, MD;  Location: [facility] ENDOSCOPY;  Service: Pulmonary;;   XI ROBOTIC ASSISTED PERICARDIAL WINDOW Left 02/07/2021   Procedure: XI ROBOTIC ASSISTED THORACOSCOPY PERICARDIAL WINDOW;  Surgeon: Corliss Skains, MD;  Location: [facility] OR;  Service: Thoracic;  Laterality: Left;   Social History   Social History Narrative   Not on file   Immunization History   Administered Date(s) Administered   Covid-19 Iv Non-us Vaccine (Bibp, [employer]opharm) 12/28/2022   Influenza, High Dose Seasonal PF 01/22/2019   Influenza,inj,Quad PF,6+ Mos 03/08/2018   Influenza-Unspecified 02/08/2020, 02/16/2022, 12/29/2022   PFIZER(Purple Top)SARS-COV-2 Vaccination 06/02/2019, 06/23/2019, 12/24/2019     Objective: Vital Signs: LMP 04/28/2000    Physical Exam   Musculoskeletal Exam: ***  CDAI Exam: CDAI Score: -- Patient Global: --; Provider Global: -- Swollen: --; Tender: -- Joint Exam 07/01/2023   No joint exam has been documented for this visit   There is currently no information documented on the homunculus. Go to the Rheumatology activity and complete the homunculus joint exam.  Investigation: No additional findings.  Imaging: No results found.  Recent Labs: Lab Results  Component Value Date   WBC 9.0 05/12/2023   HGB 8.5 (L) 05/12/2023   PLT 368 05/12/2023   NA 142 05/28/2023   K 3.8 05/28/2023   CL 95 (L) 05/28/2023   CO2 27 05/28/2023   GLUCOSE 81 05/28/2023   BUN 11 05/28/2023   CREATININE 1.04 (H) 05/28/2023   BILITOT 0.4 05/12/2023   ALKPHOS 78 05/12/2023   AST 22 05/12/2023   ALT 11 05/12/2023   PROT 7.0 05/12/2023   ALBUMIN 3.3 (L) 05/12/2023   CALCIUM 10.2 05/28/2023   GFRAA >60 01/17/2020    Speciality Comments: PLQ eye exam:  01/26/2023 WNL @ [facility]. Follow up in 1 year.  Called to obtain recent eye exam from 2024-  Procedures:  No procedures performed Allergies: Patient has no known allergies.   Assessment / Plan:     Visit Diagnoses: No diagnosis found.  Orders: No orders of the defined types were placed in this encounter.  No orders of the defined types were placed in this encounter.   Face-to-face time spent with patient was *** minutes. Greater than 50% of time was spent in counseling and coordination of care.  Follow-Up Instructions: No follow-ups on file.   Ellen Henri, CMA  Note -  This record has been created using Animal nutritionist.  Chart creation errors have been sought, but may not always  have been located. Such creation errors do not reflect on  the standard of medical care.

## 2023-06-18 ENCOUNTER — Telehealth: Payer: Self-pay

## 2023-06-18 NOTE — Telephone Encounter (Signed)
Left pt a detailed message. Ok for pt to switch cardiologist. Please arrange 4-6 month appointment with Dr. Jacques Navy. Thank you.

## 2023-06-18 NOTE — Progress Notes (Signed)
Left pt a detailed message. Ok for pt to switch cardiologist. Message sent to the scheduling team.

## 2023-06-24 ENCOUNTER — Telehealth: Payer: Self-pay | Admitting: Family Medicine

## 2023-06-24 NOTE — Telephone Encounter (Signed)
 Copied from CRM [id number]. Topic: General - Other >> Jun 24, 2023 12:58 PM Corin V wrote: Reason for CRM: Home Health is refaxing an order that they need signed and returned. Order number is [id number]. If this is not received please call back and have it resent. Call back: [phone], ext. [phone]    Put in the provider box

## 2023-06-25 ENCOUNTER — Ambulatory Visit: Payer: Medicare PPO | Admitting: Primary Care

## 2023-06-25 NOTE — Telephone Encounter (Signed)
 Faxed successfully to [facility]

## 2023-06-26 ENCOUNTER — Encounter: Payer: Self-pay | Admitting: Primary Care

## 2023-06-26 DIAGNOSIS — C3490 Malignant neoplasm of unspecified part of unspecified bronchus or lung: Secondary | ICD-10-CM | POA: Diagnosis not present

## 2023-07-01 ENCOUNTER — Ambulatory Visit: Payer: Medicare PPO | Admitting: Rheumatology

## 2023-07-01 DIAGNOSIS — C3491 Malignant neoplasm of unspecified part of right bronchus or lung: Secondary | ICD-10-CM

## 2023-07-01 DIAGNOSIS — M0579 Rheumatoid arthritis with rheumatoid factor of multiple sites without organ or systems involvement: Secondary | ICD-10-CM

## 2023-07-01 DIAGNOSIS — Z8639 Personal history of other endocrine, nutritional and metabolic disease: Secondary | ICD-10-CM

## 2023-07-01 DIAGNOSIS — Z79899 Other long term (current) drug therapy: Secondary | ICD-10-CM

## 2023-07-01 DIAGNOSIS — M5030 Other cervical disc degeneration, unspecified cervical region: Secondary | ICD-10-CM

## 2023-07-03 DIAGNOSIS — I469 Cardiac arrest, cause unspecified: Secondary | ICD-10-CM | POA: Diagnosis not present

## 2023-07-06 ENCOUNTER — Telehealth: Payer: Self-pay | Admitting: Family Medicine

## 2023-07-06 NOTE — Telephone Encounter (Signed)
 Copied from CRM [id number]. Topic: General - Other >> Jul 06, 2023  9:50 AM Ernst Spell wrote: Reason for CRM: Pat from [facility] called to inform Dr. Janee Morn that the patient's death certificate is in the electronic system waiting for him to complete. Please advise at [phone].

## 2023-07-07 NOTE — Telephone Encounter (Signed)
 Spoke with Dennie Bible at the funeral home and she verbalized understanding.

## 2023-07-21 ENCOUNTER — Other Ambulatory Visit: Payer: Medicare PPO

## 2023-07-24 ENCOUNTER — Ambulatory Visit: Payer: Medicare PPO | Admitting: Nurse Practitioner

## 2023-07-27 ENCOUNTER — Ambulatory Visit: Payer: Medicare PPO | Admitting: Radiation Oncology

## 2023-07-28 DEATH — deceased

## 2023-08-10 ENCOUNTER — Other Ambulatory Visit: Payer: Medicare PPO

## 2023-08-17 ENCOUNTER — Ambulatory Visit: Payer: Medicare PPO | Admitting: Internal Medicine
